# Patient Record
Sex: Female | Born: 1960
Health system: Southern US, Community
[De-identification: ages and names within clinical notes are randomized; demographics above are authoritative.]

## PROBLEM LIST (undated history)

## (undated) DIAGNOSIS — J449 Chronic obstructive pulmonary disease, unspecified: Secondary | ICD-10-CM

## (undated) DIAGNOSIS — R112 Nausea with vomiting, unspecified: Secondary | ICD-10-CM

## (undated) DIAGNOSIS — F329 Major depressive disorder, single episode, unspecified: Secondary | ICD-10-CM

## (undated) DIAGNOSIS — I639 Cerebral infarction, unspecified: Secondary | ICD-10-CM

## (undated) DIAGNOSIS — E876 Hypokalemia: Secondary | ICD-10-CM

## (undated) DIAGNOSIS — I499 Cardiac arrhythmia, unspecified: Secondary | ICD-10-CM

## (undated) DIAGNOSIS — I69322 Dysarthria following cerebral infarction: Secondary | ICD-10-CM

## (undated) DIAGNOSIS — I219 Acute myocardial infarction, unspecified: Secondary | ICD-10-CM

## (undated) DIAGNOSIS — Z87442 Personal history of urinary calculi: Secondary | ICD-10-CM

## (undated) DIAGNOSIS — F32A Depression, unspecified: Secondary | ICD-10-CM

## (undated) DIAGNOSIS — N189 Chronic kidney disease, unspecified: Secondary | ICD-10-CM

## (undated) DIAGNOSIS — E785 Hyperlipidemia, unspecified: Secondary | ICD-10-CM

## (undated) DIAGNOSIS — I10 Essential (primary) hypertension: Secondary | ICD-10-CM

## (undated) DIAGNOSIS — J189 Pneumonia, unspecified organism: Secondary | ICD-10-CM

## (undated) DIAGNOSIS — R001 Bradycardia, unspecified: Secondary | ICD-10-CM

## (undated) DIAGNOSIS — Z9889 Other specified postprocedural states: Secondary | ICD-10-CM

## (undated) HISTORY — DX: Dysarthria following cerebral infarction: I69.322

## (undated) HISTORY — DX: Essential (primary) hypertension: I10

## (undated) HISTORY — DX: Hypokalemia: E87.6

## (undated) HISTORY — DX: Chronic obstructive pulmonary disease, unspecified: J44.9

## (undated) HISTORY — PX: TUMOR REMOVAL: SHX12

## (undated) HISTORY — DX: Cerebral infarction, unspecified: I63.9

## (undated) HISTORY — DX: Depression, unspecified: F32.A

## (undated) HISTORY — DX: Hyperlipidemia, unspecified: E78.5

## (undated) HISTORY — DX: Major depressive disorder, single episode, unspecified: F32.9

---

## 2007-01-26 ENCOUNTER — Encounter: Admission: RE | Admit: 2007-01-26 | Discharge: 2007-01-26 | Payer: Self-pay | Admitting: General Surgery

## 2007-01-27 ENCOUNTER — Ambulatory Visit (HOSPITAL_BASED_OUTPATIENT_CLINIC_OR_DEPARTMENT_OTHER): Admission: RE | Admit: 2007-01-27 | Discharge: 2007-01-27 | Payer: Self-pay | Admitting: General Surgery

## 2007-01-27 ENCOUNTER — Encounter (INDEPENDENT_AMBULATORY_CARE_PROVIDER_SITE_OTHER): Payer: Self-pay | Admitting: General Surgery

## 2007-08-06 ENCOUNTER — Other Ambulatory Visit: Payer: Self-pay | Admitting: Obstetrics & Gynecology

## 2007-08-06 ENCOUNTER — Emergency Department (HOSPITAL_COMMUNITY): Admission: EM | Admit: 2007-08-06 | Discharge: 2007-08-07 | Payer: Self-pay | Admitting: Emergency Medicine

## 2008-01-23 ENCOUNTER — Inpatient Hospital Stay (HOSPITAL_COMMUNITY): Admission: EM | Admit: 2008-01-23 | Discharge: 2008-01-25 | Payer: Self-pay | Admitting: Emergency Medicine

## 2008-02-27 ENCOUNTER — Encounter: Admission: RE | Admit: 2008-02-27 | Discharge: 2008-04-17 | Payer: Self-pay | Admitting: Emergency Medicine

## 2009-01-03 ENCOUNTER — Emergency Department (HOSPITAL_COMMUNITY): Admission: EM | Admit: 2009-01-03 | Discharge: 2009-01-03 | Payer: Self-pay | Admitting: Emergency Medicine

## 2010-12-09 NOTE — Procedures (Signed)
CLINICAL HISTORY:  A 50 year old patient being evaluated for seizure.   MEDICATIONS LISTED:  Hydrochlorothiazide, Lisinopril, Zocor, Lexapro,  Aspirin, Tylenol, Ambien, and Ativan.   The background awake rhythm consists of probably 8 to 9 Hz alpha, which  is of low amplitude, synchronous.  The excessive muscle movement  artifacts were seen throughout the tracing.  High frequency low  amplitude beta activities were also seen throughout the tracing, which  may be a medication effect.  Definite sleep stages are not seen in this  tracing.  No paroxysmal epileptiform activity spikes or sharp waves are  seen.  Length of this recording is 22.7 minutes.  Technical component is  suboptimal with excessive movement artifacts.  Towards the end of the  tracing, the patient has excessive leg movements, which compromise the  study.   IMPRESSION:  This EEG performed during awake state is probably within  normal limits, though suboptimal with excessive movement artifacts.           ______________________________  Kathie Rhodes. Leonie Man, MD     [id number]  D:  01/24/2008 18:13:39  T:  01/25/2008 11:20:43  Job #:  [id number]   cc:   Jill Alexanders, M.D.  Fax: [phone]

## 2010-12-09 NOTE — Op Note (Signed)
NAMEROBENA, MALONEY             ACCOUNT NO.:  [id number]   MEDICAL RECORD NO.:  [id number]          PATIENT TYPE:  AMB   LOCATION:  [place]                          FACILITY:  [facility]   PHYSICIAN:  Marland Kitchen T. Hoxworth, M.D.DATE OF BIRTH:  19-Apr-1961   DATE OF PROCEDURE:  01/27/2007  DATE OF DISCHARGE:                               OPERATIVE REPORT      Marland Kitchen T. Hoxworth, M.D.  Electronically Signed     BTH/MEDQ  D:  01/27/2007  T:  01/28/2007  Job:  [id number]

## 2010-12-09 NOTE — Op Note (Signed)
Kelly Gibson, Kelly Gibson             ACCOUNT NO.:  [id number]   MEDICAL RECORD NO.:  [id number]          PATIENT TYPE:  AMB   LOCATION:  [place]                          FACILITY:  [facility]   PHYSICIAN:  Marland Kitchen T. Hoxworth, M.D.DATE OF BIRTH:  06/06/1961   DATE OF PROCEDURE:  01/27/2007  DATE OF DISCHARGE:                               OPERATIVE REPORT   PRE AND POSTOPERATIVE DIAGNOSIS:  Large soft tissue mass, probable  lipoma right shoulder.   SURGICAL PROCEDURES:  Excision large soft tissue mass right shoulder.   SURGEON:  Darene Lamer. Hoxworth, M.D.   ANESTHESIA:  Laryngeal mask general.   BRIEF HISTORY:  [name] is a 50 year old female who presents with  long history of a gradually enlarging mass over the top of the right  shoulder.  Examination has revealed a approximately 12.6 cm well  circumscribed fleshy probably subcutaneous mass overlying the top of the  right shoulder consistent with a very large lipoma.  Excision under  general anesthesia been recommended and  accepted.  The nature of the  procedure, indications, risks of bleeding and infection were discussed  understood.  She is now brought to the operating room for this  procedure.   DESCRIPTION OF OPERATION:  The patient brought to the operating room and  placed in supine position on the operating table and laryngeal mask  general anesthesia was induced.  She received preoperative antibiotics.  The right shoulder was widely sterilely prepped and draped.  Correct  patient procedure were verified.  I made a anterior-posterior directed  incision along the long access axis of the mass and dissection was  carried down to the subcutaneous tissue.  Encountered was a fairly well  circumscribed fleshy fatty tumor consistent with a very large lipoma  lying in the deep subcutaneous space against the fascia.  For the most  part this was dissected away from surrounding soft tissue bluntly.  A  few adherent areas were taken  with cautery and the mass completely  excised.  Hemostasis obtained with cautery.  The soft tissue was  infiltrated with Marcaine.  The subcu was closed with interrupted 3-0  Vicryl.  The skin was closed with running subcuticular 4-0 Monocryl and  Steri-Strips.  Sponge needle counts correct.  Dry sterile dressings were  applied.  The patient taken recovery in good condition.      Darene Lamer. Hoxworth, M.D.  Electronically Signed     BTH/MEDQ  D:  01/27/2007  T:  01/28/2007  Job:  [id number]

## 2010-12-09 NOTE — H&P (Signed)
NAME:  Kelly Gibson             ACCOUNT NO.:  [id number]   MEDICAL RECORD NO.:  [id number]          PATIENT TYPE:  INP   LOCATION:  4702                         FACILITY:  [facility]   PHYSICIAN:  Jill Alexanders, M.D.  DATE OF BIRTH:  05/08/1961   DATE OF ADMISSION:  01/23/2008  DATE OF DISCHARGE:                              HISTORY & PHYSICAL   HISTORY OF PRESENT ILLNESS:  Kelly Gibson is a 50 year old right-  handed black female, born June 21, 1961, with a history of  hypertension, obesity.  The patient was chasing a parakeet around today  and became short of breath and dizzy.  The patient sat down and then was  suddenly noted to have her eyes rolled back into her head and was  shaking all over for about 1-2 minutes.  The patient was able to respond  after that point, but began having nausea and vomiting and reported  tingling sensations down the left arm and leg and fell weak all over.  The patient was slightly disoriented, but eventually came around, but  still reports some tingling sensations on the left arm and leg and heavy  sensation around the left hip.  The patient denies any visual changes or  headache.  Patient has had some blurred vision for last several days.  CT scan of the brain done through the emergency room was unremarkable.  The patient had been seen by Neurology as a stroke, which was  subsequently cancelled after the history of seizure was undetermined.   PAST MEDICAL HISTORY:  1. Significnat for one new onset seizure event with left-sided      symptoms post event.  2. Obesity.  3. Hypertension.  4. Dyslipidemia.  5. Lipoma resection in the past.  6. Functional examination.  7. History of anxiety.   MEDICATIONS:  Include,  1. Lexapro 20 mg daily.  2. Pravastatin 20 mg daily.  3. Lisinopril 40 mg a day.  4. Hydrochlorothiazide 25 mg daily.  5. Multivitamins daily.   ALLERGIES:  The patient has no known allergies, do not smoke or drink.   SOCIAL  HISTORY:  This patient is married, lives in the [place] area, and has 3 children who are alive and well.  The  patient works as an Optometrist.  Family medical history noted that  mother died with complications of diabetes, father died with an MI.  The  patient's brother died with an MI, another brother died from [place].  The  patient had total of 7 brothers and sisters, 1 sister has diabetes.   REVIEW OF SYSTEMS:  Notable for problems with headache on occasion,  blurring of vision recently, denies abdominal pain, headache, occasional  period of chest pain with short of breath after running around today,  had some urinary incontinence with the above event today.  The patient  denies any dizziness, loss of consciousness, otherwise no prior history  of seizure.   PHYSICAL EXAMINATION:  VITALS:  Blood pressure is 154/67, heart rate is  68.  The patient is afebrile.  GENERAL:  In general, this patient is moderately to  markedly obese black  female who is alert and cooperative at the time of examination.  HEENT:  Examination of head is atraumatic.  EYES: Pupils equal, round, and reactive to light.  Disks are flat  bilaterally.  NECK:  Supple.  No carotid bruits noted.  RESPIRATORY EXAMINATION:  Clear.  Cardiovascular EXAMINATION:  Revealed regular rate and rhythm.  No  obvious murmurs, rubs noted.  ABDOMEN:  Reveals an obese abdomen.  Positive bowel sounds.  No  organomegaly or tenderness noted.  EXTREMITIES:  However without significant edema.  NEUROLOGIC EXAMINATION:  Cranial nerves as above.  Facial symmetry is  present.  Patient has good sensation of the face on the right side,  decreased on the left.  The patient splits the midline with vibratory  sensation of forehead.  The patient has again full extraocular  movements.  Visual fields are full.  Speech is well enunciated, not  aphasic.  No laceration of the tongue is seen.  The patient protrudes  the tongue in the  midline.  Motor testing reveals good strength on all  fours, good symmetric motor is noted throughout.  No drift is seen in  the arms or legs.  The patient has decreased pinprick sensation on left  arm, left leg, superior at the right, vibratory sensation of the arms is  symmetric.  Decreased on the left foot as compared to the right.  The  patient has good finger-nose-finger heel-to-shin.  Gait was not tested.  Deep tendon reflexes are symmetric.  Normal toes are neutral  bilaterally.   LABORATORY VALUES:  Included, white count of 6.4, hemoglobin 13.3,  hematocrit 39.9, MCV of 79.3, platelets of 380, INR 1.0.   Chemistry panel is pending at this time.  CT scan of the head is  unremarkable.   IMPRESSION:  1. New onset seizure type event with focal left-sided symptoms      following the event.  2. Obesity.  3. Hypertension.  4. Functional examination.   This patient has essentially fully recovered from the event witnessed  today.  This patient reports a left hemisensory deficit following the  event, but has functional features on today's examination, no objective  abnormalities.  The event occurred after physical exercise, need to rule  out cardiac rhythm abnormality that may have occurred.   PLAN:  1. Admission to 3000 units monitor bed.  2. Seizure precautions.  3. MRI of the brain.  4. MRI angiogram.  5. EEG study.  No anticonvulsants for now.  We will follow patient's      clinical course while in house.  We will initiate aspirin therapy.      Jill Alexanders, M.D.  Electronically Signed     CKW/MEDQ  D:  01/23/2008  T:  01/24/2008  Job:  [id number]   cc:   [facility]rs[facility] Burnet  [facility]

## 2010-12-09 NOTE — Discharge Summary (Signed)
Kelly Gibson, Kelly Gibson             ACCOUNT NO.:  [id number]   MEDICAL RECORD NO.:  [id number]          PATIENT TYPE:  INP   LOCATION:  4702                         FACILITY:  [facility]   PHYSICIAN:  Jill Alexanders, M.D.  DATE OF BIRTH:  April 04, 1961   DATE OF ADMISSION:  01/23/2008  DATE OF DISCHARGE:                               DISCHARGE SUMMARY   ADMISSION DIAGNOSES:  1. Onset of generalized jerking presumed seizure event with left      hemisensory deficit.  2. Functional examination.  3. Obesity.  4. Hypertension.  5. Dyslipidemia.   DISCHARGE DIAGNOSES:  1. Presumed seizure event with left hemisensory deficit.  2. Functional examination.  3. New onset of diabetes.  4. Obesity.  5. Hypertension.  6. Dyslipidemia.   PROCEDURE:  Procedures done during this admission include.  1. CT scan of the brain.  2. MRI of the brain.  3. MRI angiogram of the intracranial vessels.  4. EEG study.   COMPLICATIONS OF ABOVE PROCEDURES:  None.   HISTORY OF PRESENT ILLNESS:  [name] is a 50 year old right-  handed black female born [birth year], with a history of  hypertension, and obesity.  The patient was chasing a parakeet around  the house on the day of admission and became short of breath and dizzy.  The patient then sat down and was suddenly noted to have her eyes rolled  back into her head and was shaking all over for about 1-2 minutes.  The  patient was able to respond after that point, but began having nausea  and vomiting and then reported tingling sensations down the left arm and  left leg and felt weak all over.  The patient is brought to the  emergency room as a code stroke and CT scan of the brain was done, was  unremarkable.  Code stroke was cancelled.   PAST MEDICAL HISTORY:  Significant.  1. New onset of seizure event with left-sided symptoms following the      event.  2. Obesity.  3. Hypertension.  4. Dyslipidemia.  5. Lipoma resection in the past.  6. Functional examination.  7. History of anxiety.  8. New onset diabetes this admission.   MEDICATIONS:  Include.  1. Lexapro 20 mg daily.  2. Pravastatin 20 mg daily.  3. Lisinopril 40 mg daily.  4. Hydrochlorothiazide 25 mg daily.  5. Multivitamins daily.   ALLERGIES:  The patient has no known allergies.   Does not smoke or drink.  Please refer to history and physical dictation  summary for social history, family history, review of systems, physical  examination.   LABORATORY VALUES:  During this admission included, a white count of  6.4, hemoglobin 13.3, hematocrit 39.9, MCV of 79.3, platelets of 380,  INR 1.0, sodium 132, potassium 3.4, chloride of 97, CO2 21, glucose of  202, BUN of 18, creatinine of 0.93.  The patient has a total bili of  0.9, alk phosphatase of 109, SGOT 29, SGPT of 18, total protein 7.9,  albumin of 3.8, calcium 9.2, CK of 89, MB fraction 1.0, troponin-I 0.01.  The  patient had a hemoglobin A1c of 8.8.  Thyroid profile is pending.   CBGs done during this hospitalization showed a range of 170s to almost  220.   HOSPITAL COURSE:  This patient has done well during the course of  hospitalization.  The patient continued to persist with a left  hemisensory deficit into the second day of hospitalization; however  clinical examination suggested functional features with splitting in  midline with vibratory sensation in the forehead.  The patient underwent  MRI scan of the brain that was unremarkable.  MRI angiogram of the  intracranial vessels by my review is unremarkable.  EEG study has been  done, but the formal report is not available.  The patient has had no  further jerking episodes.  The patient is not been placed on  anticonvulsant medications.  The patient was set up with a dietician to  go over diabetic diet training.  The patient was not placed on  medications for diabetes at this point and we will leave this to the  patient's primary physician to  decide which medications are appropriate  to initiate therapy.  The patient possibly may have had a syncopal  seizure event and will not be treated with anticonvulsants at this  point.  The patient is not to drive for 90 days and will follow-up with  [facility] in 2-3 months following discharge.   DISCHARGE MEDICATIONS:  Include.  1. Lexapro 20 mg daily.  2. Pravastatin 20 mg daily.  3. Lisinopril 40 mg daily.  4. Hydrochlorothiazide 25 mg daily.  5. Multivitamins daily.  6. Aspirin 81 mg daily.   FOLLOWUP:  The patient will need to follow-up with her primary doctor,  Dr. Erskin Burnet with the next week or so to initiate treatment for diabetes.  At the time discharge, the patient is bright, alert, cooperative moves  all 4 extremities now claims to have no sensory deficit.  The patient  will contact our office immediately if another blackout spells/seizure  event occurs.      Jill Alexanders, M.D.  Electronically Signed     CKW/MEDQ  D:  01/25/2008  T:  01/25/2008  Job:  [id number]   cc:   [facility]

## 2011-04-16 LAB — POCT PREGNANCY, URINE
Operator id: 127531
Preg Test, Ur: NEGATIVE

## 2011-04-23 LAB — URINE DRUGS OF ABUSE SCREEN W ALC, ROUTINE (REF LAB)
Benzodiazepines.: NEGATIVE
Cocaine Metabolites: NEGATIVE
Methadone: NEGATIVE
Phencyclidine (PCP): NEGATIVE
Propoxyphene: NEGATIVE

## 2011-04-23 LAB — COMPREHENSIVE METABOLIC PANEL
Albumin: 3.8
BUN: 18
Calcium: 9.2
Chloride: 97
Creatinine, Ser: 0.93
GFR calc non Af Amer: >60
Total Bilirubin: 0.9

## 2011-04-23 LAB — BASIC METABOLIC PANEL
BUN: 21
Calcium: 9.3
Creatinine, Ser: 0.84
GFR calc non Af Amer: >60

## 2011-04-23 LAB — CBC
Hemoglobin: 13.3
MCHC: 33.4
Platelets: 380
RDW: 15.3

## 2011-04-23 LAB — PROTIME-INR: INR: 1

## 2011-04-23 LAB — DIFFERENTIAL
Basophils Absolute: 0
Basophils Relative: 0
Lymphocytes Relative: 35
Monocytes Relative: 8
Neutro Abs: 3.5
Neutrophils Relative %: 54

## 2011-04-23 LAB — TROPONIN I

## 2011-04-23 LAB — CK TOTAL AND CKMB (NOT AT ARMC): CK, MB: 1

## 2011-04-23 LAB — APTT: aPTT: 26

## 2011-05-12 LAB — CBC
HCT: 34.2 — ABNORMAL LOW
MCHC: 33.3
MCV: 81
Platelets: 376
RDW: 14.5 — ABNORMAL HIGH
WBC: 5.1

## 2011-05-12 LAB — URINALYSIS, ROUTINE W REFLEX MICROSCOPIC
Bilirubin Urine: NEGATIVE
Nitrite: NEGATIVE
Specific Gravity, Urine: 1.025
pH: 5.5

## 2011-05-12 LAB — COMPREHENSIVE METABOLIC PANEL
AST: 26
CO2: 24
Calcium: 8.7
Creatinine, Ser: 0.78
GFR calc non Af Amer: >60

## 2011-05-12 LAB — DIFFERENTIAL
Basophils Absolute: 0
Lymphocytes Relative: 31
Monocytes Absolute: 0.4
Neutro Abs: 2.9

## 2011-05-12 LAB — POCT HEMOGLOBIN-HEMACUE
Hemoglobin: 11.9 — ABNORMAL LOW
Operator id: 208731

## 2011-11-24 ENCOUNTER — Ambulatory Visit: Payer: Self-pay | Admitting: Family Medicine

## 2011-11-24 ENCOUNTER — Encounter: Payer: Self-pay | Admitting: Family Medicine

## 2011-12-14 ENCOUNTER — Encounter: Payer: Self-pay | Admitting: Family Medicine

## 2011-12-14 ENCOUNTER — Ambulatory Visit (INDEPENDENT_AMBULATORY_CARE_PROVIDER_SITE_OTHER): Payer: Managed Care, Other (non HMO) | Admitting: Family Medicine

## 2011-12-14 VITALS — BP 138/80 | HR 84 | Resp 18 | Ht 63.0 in | Wt 187.1 lb

## 2011-12-14 DIAGNOSIS — E119 Type 2 diabetes mellitus without complications: Secondary | ICD-10-CM

## 2011-12-14 DIAGNOSIS — F329 Major depressive disorder, single episode, unspecified: Secondary | ICD-10-CM

## 2011-12-14 DIAGNOSIS — I10 Essential (primary) hypertension: Secondary | ICD-10-CM

## 2011-12-14 DIAGNOSIS — E785 Hyperlipidemia, unspecified: Secondary | ICD-10-CM

## 2011-12-14 DIAGNOSIS — E669 Obesity, unspecified: Secondary | ICD-10-CM

## 2011-12-14 DIAGNOSIS — E1165 Type 2 diabetes mellitus with hyperglycemia: Secondary | ICD-10-CM

## 2011-12-14 DIAGNOSIS — F3289 Other specified depressive episodes: Secondary | ICD-10-CM

## 2011-12-14 DIAGNOSIS — E1140 Type 2 diabetes mellitus with diabetic neuropathy, unspecified: Secondary | ICD-10-CM | POA: Insufficient documentation

## 2011-12-14 DIAGNOSIS — F32A Depression, unspecified: Secondary | ICD-10-CM

## 2011-12-14 NOTE — Patient Instructions (Addendum)
Increase your Metformin to 1 tablet in the morning and 2 at night for 1 week then to 2 tablets twice a day Continue the lisinopril Continue glyburide  Pravastatin  Get the labs done fasting - we will call with results Schedule a physical exam for PAP Smear - within the next 6 weeks

## 2011-12-17 ENCOUNTER — Encounter: Payer: Self-pay | Admitting: Family Medicine

## 2011-12-17 DIAGNOSIS — I129 Hypertensive chronic kidney disease with stage 1 through stage 4 chronic kidney disease, or unspecified chronic kidney disease: Secondary | ICD-10-CM | POA: Insufficient documentation

## 2011-12-17 DIAGNOSIS — F329 Major depressive disorder, single episode, unspecified: Secondary | ICD-10-CM | POA: Insufficient documentation

## 2011-12-17 DIAGNOSIS — F32A Depression, unspecified: Secondary | ICD-10-CM | POA: Insufficient documentation

## 2011-12-17 DIAGNOSIS — R45851 Suicidal ideations: Secondary | ICD-10-CM | POA: Insufficient documentation

## 2011-12-17 DIAGNOSIS — I10 Essential (primary) hypertension: Secondary | ICD-10-CM | POA: Insufficient documentation

## 2011-12-17 DIAGNOSIS — E669 Obesity, unspecified: Secondary | ICD-10-CM

## 2011-12-17 DIAGNOSIS — E1159 Type 2 diabetes mellitus with other circulatory complications: Secondary | ICD-10-CM | POA: Insufficient documentation

## 2011-12-17 DIAGNOSIS — E785 Hyperlipidemia, unspecified: Secondary | ICD-10-CM | POA: Insufficient documentation

## 2011-12-17 HISTORY — DX: Obesity, unspecified: E66.9

## 2011-12-17 NOTE — Assessment & Plan Note (Addendum)
LDL 163 in November 2012, continue statin therapy

## 2011-12-17 NOTE — Assessment & Plan Note (Signed)
From todays report she has been stable on medication

## 2011-12-17 NOTE — Assessment & Plan Note (Signed)
>>  ASSESSMENT AND PLAN FOR ESSENTIAL HYPERTENSION, BENIGN WRITTEN ON 12/17/2011  6:07 PM BY [place], KAWANTA F  Mild elevation in blood pressure, no change to meds today

## 2011-12-17 NOTE — Progress Notes (Signed)
  Subjective:    Patient ID: Kelly Gibson, female    DOB: [birth year], 51 y.o.   MRN: [id number]  HPI  Pt here to establish care, previous PCP in [place] in [place], she was also being seen by [facility] urgent care for a few visits Medications and history reviewed DM- diagnosed in 2010 currently on Metformin and glyburide, lantus also prescribed in the past but she is not taking this, fastings are 200, she gets recurrent yeast infections. Last A1C 9.2% in June 2012. HTN- on ACEI  And diuretic Stress/depresison- has been stable on cymbalta for over a year  Due for PAP and Mammogram She has a strong family history of CAD, she had early deaths in siblings secondary to Myocardial infarction  Review of Systems    GEN- denies fatigue, fever, weight loss,weakness, recent illness HEENT- denies eye drainage, change in vision, nasal discharge, CVS- denies chest pain, palpitations RESP- denies SOB, cough, wheeze ABD- denies N/V, change in stools, abd pain GU- denies dysuria, hematuria, dribbling, incontinence MSK- denies joint pain, muscle aches, injury Neuro- denies headache, dizziness, syncope, seizure activity      Objective:   Physical Exam GEN- NAD, alert and oriented x3 HEENT- PERRL, EOMI, non injected sclera, pink conjunctiva, MMM, oropharynx clear Neck- Supple, no thyromegaly CVS- RRR, no murmur RESP-CTAB ABD-NABS,soft, NT,ND EXT- No edema Pulses- Radial, DP- 2+ Psych-normal affect and mood       Assessment & Plan:

## 2011-12-17 NOTE — Assessment & Plan Note (Signed)
Check A1C then titrate medications, continue metformin and glyburide for now

## 2011-12-17 NOTE — Assessment & Plan Note (Signed)
Mild elevation in blood pressure, no change to meds today

## 2011-12-17 NOTE — Assessment & Plan Note (Signed)
>>  ASSESSMENT AND PLAN FOR TYPE 2 DIABETES MELLITUS WITH DIABETIC NEUROPATHY, WITH LONG-TERM CURRENT USE OF INSULIN  ([facility]) WRITTEN ON 12/17/2011  6:06 PM BY [place], KAWANTA F  Check A1C then titrate medications, continue metformin  and glyburide  for now

## 2011-12-18 ENCOUNTER — Other Ambulatory Visit: Payer: Self-pay | Admitting: Family Medicine

## 2011-12-18 MED ORDER — HYDROCHLOROTHIAZIDE 25 MG PO TABS: 25.0000 mg | ORAL_TABLET | Freq: Every day | ORAL | Status: DC

## 2012-01-12 ENCOUNTER — Telehealth: Payer: Self-pay | Admitting: Family Medicine

## 2012-01-12 NOTE — Telephone Encounter (Signed)
Please ask what the Wellbutrin is for, my records indicate she is not a smoker if it for depression she needs to come in for a visit, she was suppose to schedule a physical 6 weeks from last visit

## 2012-01-14 MED ORDER — BUPROPION HCL ER (SR) 150 MG PO TB12: ORAL_TABLET | ORAL | Status: DC

## 2012-01-14 NOTE — Telephone Encounter (Signed)
Wellbutrin sent,please let pt know

## 2012-01-14 NOTE — Telephone Encounter (Signed)
It is for smoking. She had some stressful events take place lately and she started back smoking. She is going on vacation and will schedule her physical as soon as she gets back.

## 2012-01-14 NOTE — Addendum Note (Signed)
Addended by: Vic Blackbird F on: 01/14/2012 12:05 PM   Modules accepted: Orders

## 2012-03-03 LAB — LIPID PANEL
LDL Cholesterol: 129 mg/dL — ABNORMAL HIGH (ref 0–99)
Triglycerides: 244 mg/dL — ABNORMAL HIGH (ref <150)
VLDL: 49 mg/dL — ABNORMAL HIGH (ref 0–40)

## 2012-03-03 LAB — CBC
HCT: 35.9 % — ABNORMAL LOW (ref 36.0–46.0)
Hemoglobin: 12 g/dL (ref 12.0–15.0)
MCHC: 33.4 g/dL (ref 30.0–36.0)
WBC: 5.8 10*3/uL (ref 4.0–10.5)

## 2012-03-03 LAB — HEMOGLOBIN A1C
Hgb A1c MFr Bld: 9.3 % — ABNORMAL HIGH (ref <5.7)
Mean Plasma Glucose: 220 mg/dL — ABNORMAL HIGH (ref <117)

## 2012-03-03 LAB — COMPREHENSIVE METABOLIC PANEL
ALT: 11 U/L (ref 0–35)
AST: 13 U/L (ref 0–37)
Albumin: 3.9 g/dL (ref 3.5–5.2)
Alkaline Phosphatase: 80 U/L (ref 39–117)
Glucose, Bld: 192 mg/dL — ABNORMAL HIGH (ref 70–99)
Potassium: 4.3 mEq/L (ref 3.5–5.3)
Sodium: 136 mEq/L (ref 135–145)
Total Bilirubin: 0.2 mg/dL — ABNORMAL LOW (ref 0.3–1.2)
Total Protein: 7 g/dL (ref 6.0–8.3)

## 2012-03-08 ENCOUNTER — Encounter: Payer: Self-pay | Admitting: Family Medicine

## 2012-03-08 ENCOUNTER — Ambulatory Visit (INDEPENDENT_AMBULATORY_CARE_PROVIDER_SITE_OTHER): Payer: Managed Care, Other (non HMO) | Admitting: Family Medicine

## 2012-03-08 VITALS — BP 132/78 | HR 71 | Resp 16 | Wt 186.0 lb

## 2012-03-08 DIAGNOSIS — F32A Depression, unspecified: Secondary | ICD-10-CM

## 2012-03-08 DIAGNOSIS — E119 Type 2 diabetes mellitus without complications: Secondary | ICD-10-CM

## 2012-03-08 DIAGNOSIS — E785 Hyperlipidemia, unspecified: Secondary | ICD-10-CM

## 2012-03-08 DIAGNOSIS — F419 Anxiety disorder, unspecified: Secondary | ICD-10-CM

## 2012-03-08 DIAGNOSIS — F411 Generalized anxiety disorder: Secondary | ICD-10-CM

## 2012-03-08 DIAGNOSIS — F3289 Other specified depressive episodes: Secondary | ICD-10-CM

## 2012-03-08 DIAGNOSIS — E669 Obesity, unspecified: Secondary | ICD-10-CM

## 2012-03-08 DIAGNOSIS — I10 Essential (primary) hypertension: Secondary | ICD-10-CM

## 2012-03-08 DIAGNOSIS — F329 Major depressive disorder, single episode, unspecified: Secondary | ICD-10-CM

## 2012-03-08 DIAGNOSIS — M6740 Ganglion, unspecified site: Secondary | ICD-10-CM

## 2012-03-08 MED ORDER — VENLAFAXINE HCL ER 37.5 MG PO CP24: 37.5000 mg | ORAL_CAPSULE | Freq: Every day | ORAL | Status: DC

## 2012-03-08 MED ORDER — PRAVASTATIN SODIUM 20 MG PO TABS: 20.0000 mg | ORAL_TABLET | Freq: Every day | ORAL | Status: DC

## 2012-03-08 MED ORDER — METFORMIN HCL 1000 MG PO TABS: 1000.0000 mg | ORAL_TABLET | Freq: Two times a day (BID) | ORAL | Status: DC

## 2012-03-08 NOTE — Patient Instructions (Addendum)
Start Lantus 10 units at bedtime  Metformin changed to 1000mg  twice a day  Start Effexor 37.5mg  once a day  Pravastatin changed to 20mg  once a day  Stop Cymbalta  Continue all other medications  Referral to Surgeon for cyst on wrist  Try to walk 3 times a week Work on the diet and fast food  F/U 4 weeks

## 2012-03-08 NOTE — Progress Notes (Signed)
  Subjective:    Patient ID: Kelly Gibson, female    DOB: 07/31/1960, 51 y.o.   MRN: [id number]  HPI  Patient here to followup labs for hypertension diabetes. She has a lesion on her left wrist that has been there for months which is painful she states occasionally it sticks out. She is also been more anxious and depressed lately. She is on Cymbalta however this is being used for neuropathy in her feet. Her husband is with her today and states that she has mood swings. She has crying spells at times. Her job is very stressful and this is what causes her mood swings. She is sleeping well her appetite is good. She denies suicidal ideations. She needs something to help with her nerves   Review of Systems - per above    GEN- denies fatigue, fever, weight loss,weakness, recent illness HEENT- denies eye drainage, change in vision, nasal discharge, CVS- denies chest pain, palpitations RESP- denies SOB, cough, wheeze ABD- denies N/V, change in stools, abd pain GU- denies dysuria, hematuria, dribbling, incontinence MSK- denies joint pain, muscle aches, injury Neuro- denies headache, dizziness, syncope, seizure activity      Objective:   Physical Exam GEN- NAD, alert and oriented x3 CVS- RRR, no murmur RESP-CTAB EXT- No edema Pulses- Radial, DP- 2+ Left wrist- small cystic lesion beneath skins, moves with tendons as pt makes fist, mild TPP, no erythema, no induration Psych- normal affect and mood, normal thought process no hallucinations, no SI        Assessment & Plan:

## 2012-03-09 DIAGNOSIS — M6740 Ganglion, unspecified site: Secondary | ICD-10-CM | POA: Insufficient documentation

## 2012-03-09 DIAGNOSIS — F419 Anxiety disorder, unspecified: Secondary | ICD-10-CM | POA: Insufficient documentation

## 2012-03-09 HISTORY — DX: Ganglion, unspecified site: M67.40

## 2012-03-09 NOTE — Assessment & Plan Note (Signed)
At goal no change to meds

## 2012-03-09 NOTE — Assessment & Plan Note (Signed)
Refer to general surgery for removal

## 2012-03-09 NOTE — Assessment & Plan Note (Signed)
Depression and anxiety, d/c cymbalta, start effexor

## 2012-03-09 NOTE — Assessment & Plan Note (Signed)
>>  ASSESSMENT AND PLAN FOR ESSENTIAL HYPERTENSION, BENIGN WRITTEN ON 03/09/2012  9:32 PM BY Milinda Antis F  At goal no change to meds

## 2012-03-09 NOTE — Assessment & Plan Note (Signed)
Discussed importance of diet and exercise, short term goals made

## 2012-03-09 NOTE — Assessment & Plan Note (Signed)
>>  ASSESSMENT AND PLAN FOR TYPE 2 DIABETES MELLITUS WITH DIABETIC NEUROPATHY, WITH LONG-TERM CURRENT USE OF INSULIN  ([facility]) WRITTEN ON 03/09/2012  9:31 PM BY [place], KAWANTA F  Uncontrolled, restart lantus  at 10 units at bedtime, maximize Metformin  to 1000mg  BID

## 2012-03-09 NOTE — Assessment & Plan Note (Signed)
Increase pravastatin to 20mg 

## 2012-03-09 NOTE — Assessment & Plan Note (Signed)
Uncontrolled, restart lantus at 10 units at bedtime, maximize Metformin to 1000mg  BID

## 2012-05-03 ENCOUNTER — Ambulatory Visit (INDEPENDENT_AMBULATORY_CARE_PROVIDER_SITE_OTHER): Payer: Managed Care, Other (non HMO) | Admitting: Family Medicine

## 2012-05-03 ENCOUNTER — Encounter: Payer: Self-pay | Admitting: Family Medicine

## 2012-05-03 ENCOUNTER — Telehealth: Payer: Self-pay | Admitting: Family Medicine

## 2012-05-03 VITALS — BP 128/76 | HR 88 | Temp 98.2°F | Resp 18 | Ht 63.0 in | Wt 191.0 lb

## 2012-05-03 DIAGNOSIS — F329 Major depressive disorder, single episode, unspecified: Secondary | ICD-10-CM

## 2012-05-03 DIAGNOSIS — F3289 Other specified depressive episodes: Secondary | ICD-10-CM

## 2012-05-03 DIAGNOSIS — E119 Type 2 diabetes mellitus without complications: Secondary | ICD-10-CM

## 2012-05-03 DIAGNOSIS — Z720 Tobacco use: Secondary | ICD-10-CM

## 2012-05-03 DIAGNOSIS — F32A Depression, unspecified: Secondary | ICD-10-CM

## 2012-05-03 DIAGNOSIS — J40 Bronchitis, not specified as acute or chronic: Secondary | ICD-10-CM

## 2012-05-03 DIAGNOSIS — F17200 Nicotine dependence, unspecified, uncomplicated: Secondary | ICD-10-CM

## 2012-05-03 MED ORDER — VENLAFAXINE HCL ER 75 MG PO CP24: 37.5000 mg | ORAL_CAPSULE | Freq: Every day | ORAL | Status: DC

## 2012-05-03 MED ORDER — AZITHROMYCIN 250 MG PO TABS: ORAL_TABLET | ORAL | Status: AC

## 2012-05-03 MED ORDER — ALBUTEROL SULFATE HFA 108 (90 BASE) MCG/ACT IN AERS: 2.0000 | INHALATION_SPRAY | RESPIRATORY_TRACT | Status: DC | PRN

## 2012-05-03 MED ORDER — GUAIFENESIN-CODEINE 100-10 MG/5ML PO SYRP: 5.0000 mL | ORAL_SOLUTION | Freq: Three times a day (TID) | ORAL | Status: DC | PRN

## 2012-05-03 NOTE — Patient Instructions (Signed)
Treating for bronchitis Take the antibiotics and cough medication Albuterol inhaler for wheezing as needed Effexor increased to 75mg   F/U 6 weeks

## 2012-05-03 NOTE — Telephone Encounter (Signed)
Coming to be seen today per Dr

## 2012-05-03 NOTE — Telephone Encounter (Signed)
Spoke with patient and she will be worked in for this afternoon.

## 2012-05-03 NOTE — Progress Notes (Signed)
  Subjective:    Patient ID: Kelly Gibson, female    DOB: 03/31/1961, 51 y.o.   MRN: [id number]  HPI Patient presents with upper respiratory symptoms for the past week. She's tried Robitussin NyQuil and TheraFlu over-the-counter which has not helped her cough. She feels short of breath with her cough and has noticed some wheezing mostly at nighttime. She's not been smoking recently. She missed her followup appointment for her depression she states that the medication does not work any to be increased.   Review of Systems  GEN- denies fatigue, fever, weight loss,weakness, recent illness HEENT- denies eye drainage, change in vision, nasal discharge, CVS- denies chest pain, palpitations RESP- denies SOB, +cough, +wheeze ABD- denies N/V, change in stools, abd pain Neuro- denies headache, dizziness, syncope, seizure activity      Objective:   Physical Exam GEN- NAD, alert and oriented x3 HEENT- PERRL, EOMI, non injected sclera, pink conjunctiva, MMM, oropharynx clear Neck- Supple, no LAD CVS- RRR, no murmur RESP-clear with occasional wheeze, normal WOB EXT- No edema Pulses- Radial, DP- 2+ Psych-stressed appearing, normal affect, not anxious       Assessment & Plan:

## 2012-05-04 DIAGNOSIS — J40 Bronchitis, not specified as acute or chronic: Secondary | ICD-10-CM | POA: Insufficient documentation

## 2012-05-04 DIAGNOSIS — Z720 Tobacco use: Secondary | ICD-10-CM | POA: Insufficient documentation

## 2012-05-04 NOTE — Assessment & Plan Note (Signed)
Treat infection with antibiotics, albuterol inhaler given, encouraged not to smoke

## 2012-05-04 NOTE — Assessment & Plan Note (Signed)
She did not follow-up as planned from previous visit, increase effexor to 75mg 

## 2012-07-13 ENCOUNTER — Telehealth: Payer: Self-pay | Admitting: Family Medicine

## 2012-07-14 MED ORDER — LISINOPRIL 20 MG PO TABS: 20.0000 mg | ORAL_TABLET | Freq: Every day | ORAL | Status: DC

## 2012-07-14 MED ORDER — HYDROCHLOROTHIAZIDE 25 MG PO TABS: 25.0000 mg | ORAL_TABLET | Freq: Every day | ORAL | Status: DC

## 2012-07-14 MED ORDER — GLYBURIDE 2.5 MG PO TABS: ORAL_TABLET | ORAL | Status: DC

## 2012-07-14 NOTE — Telephone Encounter (Signed)
Med sent pt aware  

## 2012-07-25 ENCOUNTER — Telehealth: Payer: Self-pay | Admitting: Family Medicine

## 2012-07-25 DIAGNOSIS — M6740 Ganglion, unspecified site: Secondary | ICD-10-CM

## 2012-07-25 NOTE — Telephone Encounter (Signed)
Pt needs to make a follow-up appt, there was already a referral to Dr. Lewis Moccasin office already in [place], repeat referral placed

## 2012-07-28 NOTE — Telephone Encounter (Signed)
Pt was scheduled a appt to see dr. Caroline More and also left message for her to call office to make a appt

## 2012-10-27 ENCOUNTER — Other Ambulatory Visit: Payer: Self-pay | Admitting: Family Medicine

## 2013-01-07 ENCOUNTER — Other Ambulatory Visit: Payer: Self-pay | Admitting: Family Medicine

## 2013-01-09 NOTE — Telephone Encounter (Signed)
Med refilled.

## 2013-01-10 ENCOUNTER — Ambulatory Visit (INDEPENDENT_AMBULATORY_CARE_PROVIDER_SITE_OTHER): Payer: Managed Care, Other (non HMO) | Admitting: Family Medicine

## 2013-01-10 ENCOUNTER — Encounter: Payer: Self-pay | Admitting: Family Medicine

## 2013-01-10 VITALS — BP 114/66 | HR 80 | Temp 97.5°F | Resp 18 | Ht 61.75 in | Wt 184.0 lb

## 2013-01-10 DIAGNOSIS — F17200 Nicotine dependence, unspecified, uncomplicated: Secondary | ICD-10-CM

## 2013-01-10 DIAGNOSIS — E119 Type 2 diabetes mellitus without complications: Secondary | ICD-10-CM

## 2013-01-10 DIAGNOSIS — L609 Nail disorder, unspecified: Secondary | ICD-10-CM

## 2013-01-10 DIAGNOSIS — F3289 Other specified depressive episodes: Secondary | ICD-10-CM

## 2013-01-10 DIAGNOSIS — Z720 Tobacco use: Secondary | ICD-10-CM

## 2013-01-10 DIAGNOSIS — J40 Bronchitis, not specified as acute or chronic: Secondary | ICD-10-CM

## 2013-01-10 DIAGNOSIS — F329 Major depressive disorder, single episode, unspecified: Secondary | ICD-10-CM

## 2013-01-10 DIAGNOSIS — F32A Depression, unspecified: Secondary | ICD-10-CM

## 2013-01-10 DIAGNOSIS — I10 Essential (primary) hypertension: Secondary | ICD-10-CM

## 2013-01-10 DIAGNOSIS — E785 Hyperlipidemia, unspecified: Secondary | ICD-10-CM

## 2013-01-10 MED ORDER — METFORMIN HCL 1000 MG PO TABS: 1000.0000 mg | ORAL_TABLET | Freq: Two times a day (BID) | ORAL | Status: DC

## 2013-01-10 MED ORDER — VENLAFAXINE HCL ER 75 MG PO CP24: 75.0000 mg | ORAL_CAPSULE | Freq: Every day | ORAL | Status: DC

## 2013-01-10 MED ORDER — HYDROCHLOROTHIAZIDE 25 MG PO TABS: 25.0000 mg | ORAL_TABLET | Freq: Every day | ORAL | Status: DC

## 2013-01-10 MED ORDER — LISINOPRIL 20 MG PO TABS: 20.0000 mg | ORAL_TABLET | Freq: Every day | ORAL | Status: DC

## 2013-01-10 MED ORDER — GLYBURIDE 2.5 MG PO TABS: ORAL_TABLET | ORAL | Status: DC

## 2013-01-10 MED ORDER — ALBUTEROL SULFATE HFA 108 (90 BASE) MCG/ACT IN AERS: 2.0000 | INHALATION_SPRAY | RESPIRATORY_TRACT | Status: DC | PRN

## 2013-01-10 MED ORDER — PRAVASTATIN SODIUM 20 MG PO TABS: 20.0000 mg | ORAL_TABLET | Freq: Every day | ORAL | Status: DC

## 2013-01-10 MED ORDER — AZITHROMYCIN 250 MG PO TABS: ORAL_TABLET | ORAL | Status: DC

## 2013-01-10 MED ORDER — PREDNISONE 10 MG PO TABS: ORAL_TABLET | ORAL | Status: DC

## 2013-01-10 MED ORDER — INSULIN GLARGINE 100 UNIT/ML ~~LOC~~ SOLN: 15.0000 [IU] | Freq: Every day | SUBCUTANEOUS | Status: DC

## 2013-01-10 NOTE — Progress Notes (Signed)
  Subjective:    Patient ID: Kelly Gibson, female    DOB: 07-06-1961, 52 y.o.   MRN: [id number]  HPI Patient here to follow chronic medical problems. She's not been seen since October 2013. She's not been seen for diabetes mellitus hypertension since August. She still taking Lantus however she gets between 10-20 units depending on how she feels that she does not check her blood sugars. She states she is taking oral medications and her blood pressure medications. She complains of cough for the past month with production she's also had some wheezing and shortness of breath. She restarted smoking and is now smoking 7-8 cigarettes a day. 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 She will unfortunately lose her insurance at the end of the month   Review of Systems  GEN- denies fatigue, fever, weight loss,weakness, recent illness HEENT- denies eye drainage, change in vision, nasal discharge, CVS- denies chest pain, palpitations RESP- denies SOB,+ cough, +wheeze ABD- denies N/V, change in stools, abd pain GU- denies dysuria, hematuria, dribbling, incontinence MSK- denies joint pain, muscle aches, injury Neuro- denies headache, dizziness, syncope, seizure activity      Objective:   Physical Exam  GEN- NAD, alert and oriented x3 HEENT- PERRL, EOMI, non injected sclera, pink conjunctiva, MMM, oropharynx clear Neck- Supple, no LAD CVS- RRR, no murmur RESP-Bilateral wheeze, + rhonchi scattered bilat, normal WOB, harsh cough ABD-NABS,soft,NTND EXT- No edema Pulses- Radial, DP- 2+ Psych-Not depressed or anxious appearing      Assessment & Plan:

## 2013-01-10 NOTE — Assessment & Plan Note (Signed)
Doing well on effexor.   

## 2013-01-10 NOTE — Assessment & Plan Note (Signed)
Antibiotics, low dose prednisone, obtain CXR, concern she has recurrent bronchitis,may be sign of early COPD Would obtain PFT in future

## 2013-01-10 NOTE — Assessment & Plan Note (Signed)
Well controlled, no change to meds 

## 2013-01-10 NOTE — Assessment & Plan Note (Signed)
Continues to smoke, smoking cessation given

## 2013-01-10 NOTE — Assessment & Plan Note (Signed)
>>  ASSESSMENT AND PLAN FOR TYPE 2 DIABETES MELLITUS WITH DIABETIC NEUROPATHY, WITH LONG-TERM CURRENT USE OF INSULIN  (HCC) WRITTEN ON 01/10/2013  9:19 PM BY BARI REA F  Uncontrolled,Fasting labs needed, pt to have done on Monday,for now continue oral medications Lantus  15 units, discussed importance of f/u on regular basis Urine Micro needed Discussed testing CBG, will have her test fasting, to see if she can be compliant with this Podiatry referral

## 2013-01-10 NOTE — Patient Instructions (Signed)
Continue lantus 15 units at bedtime Start the antibiotics and prednisone/ use the inhaler every 4 hours as needed Quit smoking F/U Next Wed f/u labs Come Monday to get labs

## 2013-01-10 NOTE — Assessment & Plan Note (Signed)
>>  ASSESSMENT AND PLAN FOR ESSENTIAL HYPERTENSION, BENIGN WRITTEN ON 01/10/2013  9:14 PM BY Milinda Antis F  Well controlled, no change to meds

## 2013-01-10 NOTE — Assessment & Plan Note (Signed)
On statin drug , FLP,CMET

## 2013-01-10 NOTE — Assessment & Plan Note (Addendum)
Uncontrolled,Fasting labs needed, pt to have done on Monday,for now continue oral medications Lantus 15 units, discussed importance of f/u on regular basis Urine Micro needed Discussed testing CBG, will have her test fasting, to see if she can be compliant with this Podiatry referral

## 2013-01-11 ENCOUNTER — Ambulatory Visit
Admission: RE | Admit: 2013-01-11 | Discharge: 2013-01-11 | Disposition: A | Discharge status: Home / Self Care | Payer: Managed Care, Other (non HMO) | Source: Ambulatory Visit | Attending: Family Medicine | Admitting: Family Medicine

## 2013-01-11 DIAGNOSIS — J40 Bronchitis, not specified as acute or chronic: Secondary | ICD-10-CM

## 2013-01-12 ENCOUNTER — Other Ambulatory Visit (INDEPENDENT_AMBULATORY_CARE_PROVIDER_SITE_OTHER): Payer: Managed Care, Other (non HMO)

## 2013-01-12 DIAGNOSIS — E785 Hyperlipidemia, unspecified: Secondary | ICD-10-CM

## 2013-01-12 DIAGNOSIS — E119 Type 2 diabetes mellitus without complications: Secondary | ICD-10-CM

## 2013-01-12 LAB — CBC WITH DIFFERENTIAL/PLATELET
Basophils Absolute: 0 10*3/uL (ref 0.0–0.1)
Basophils Relative: 1 % (ref 0–1)
Eosinophils Absolute: 0.1 10*3/uL (ref 0.0–0.7)
Eosinophils Relative: 1 % (ref 0–5)
HCT: 37.8 % (ref 36.0–46.0)
Hemoglobin: 13 g/dL (ref 12.0–15.0)
Lymphocytes Relative: 30 % (ref 12–46)
Lymphs Abs: 2.4 10*3/uL (ref 0.7–4.0)
MCH: 26.4 pg (ref 26.0–34.0)
MCHC: 34.4 g/dL (ref 30.0–36.0)
MCV: 76.8 fL — ABNORMAL LOW (ref 78.0–100.0)
Monocytes Absolute: 0.5 10*3/uL (ref 0.1–1.0)
Monocytes Relative: 7 % (ref 3–12)
Neutro Abs: 5 10*3/uL (ref 1.7–7.7)
Neutrophils Relative %: 61 % (ref 43–77)
Platelets: 406 10*3/uL — ABNORMAL HIGH (ref 150–400)
RBC: 4.92 MIL/uL (ref 3.87–5.11)
RDW: 14.3 % (ref 11.5–15.5)
WBC: 8 10*3/uL (ref 4.0–10.5)

## 2013-01-12 LAB — COMPREHENSIVE METABOLIC PANEL
Albumin: 4 g/dL (ref 3.5–5.2)
BUN: 16 mg/dL (ref 6–23)
CO2: 29 mEq/L (ref 19–32)
Calcium: 10.2 mg/dL (ref 8.4–10.5)
Chloride: 98 mEq/L (ref 96–112)
Creat: 0.77 mg/dL (ref 0.50–1.10)
Potassium: 4.5 mEq/L (ref 3.5–5.3)

## 2013-01-12 LAB — HEMOGLOBIN A1C
Hgb A1c MFr Bld: 11.3 % — ABNORMAL HIGH (ref <5.7)
Mean Plasma Glucose: 278 mg/dL — ABNORMAL HIGH (ref <117)

## 2013-01-12 LAB — LIPID PANEL: HDL: 46 mg/dL (ref >39)

## 2013-01-18 ENCOUNTER — Encounter: Payer: Self-pay | Admitting: Family Medicine

## 2013-01-18 ENCOUNTER — Ambulatory Visit (INDEPENDENT_AMBULATORY_CARE_PROVIDER_SITE_OTHER): Payer: Managed Care, Other (non HMO) | Admitting: Family Medicine

## 2013-01-18 VITALS — BP 120/70 | HR 82 | Temp 98.2°F | Resp 20 | Ht 63.0 in | Wt 183.0 lb

## 2013-01-18 DIAGNOSIS — F411 Generalized anxiety disorder: Secondary | ICD-10-CM

## 2013-01-18 DIAGNOSIS — F329 Major depressive disorder, single episode, unspecified: Secondary | ICD-10-CM

## 2013-01-18 DIAGNOSIS — F3289 Other specified depressive episodes: Secondary | ICD-10-CM

## 2013-01-18 DIAGNOSIS — IMO0002 Reserved for concepts with insufficient information to code with codable children: Secondary | ICD-10-CM

## 2013-01-18 DIAGNOSIS — F419 Anxiety disorder, unspecified: Secondary | ICD-10-CM

## 2013-01-18 DIAGNOSIS — E1165 Type 2 diabetes mellitus with hyperglycemia: Secondary | ICD-10-CM

## 2013-01-18 DIAGNOSIS — J40 Bronchitis, not specified as acute or chronic: Secondary | ICD-10-CM

## 2013-01-18 DIAGNOSIS — F32A Depression, unspecified: Secondary | ICD-10-CM

## 2013-01-18 DIAGNOSIS — E785 Hyperlipidemia, unspecified: Secondary | ICD-10-CM

## 2013-01-18 DIAGNOSIS — IMO0001 Reserved for inherently not codable concepts without codable children: Secondary | ICD-10-CM

## 2013-01-18 MED ORDER — PRAVASTATIN SODIUM 40 MG PO TABS: 40.0000 mg | ORAL_TABLET | Freq: Every day | ORAL | Status: DC

## 2013-01-18 MED ORDER — VENLAFAXINE HCL ER 150 MG PO CP24: 150.0000 mg | ORAL_CAPSULE | Freq: Every day | ORAL | Status: DC

## 2013-01-18 MED ORDER — FLUCONAZOLE 150 MG PO TABS: 150.0000 mg | ORAL_TABLET | Freq: Once | ORAL | Status: DC

## 2013-01-18 MED ORDER — INSULIN GLARGINE 100 UNIT/ML ~~LOC~~ SOLN: 20.0000 [IU] | Freq: Every day | SUBCUTANEOUS | Status: DC

## 2013-01-18 MED ORDER — GLUCOSE BLOOD VI STRP: ORAL_STRIP | Status: DC

## 2013-01-18 NOTE — Assessment & Plan Note (Addendum)
Now resolved, cxr negative, discussed importance of tobacco cessation Pt requested diflucan since being on antibiotics, has yeast infection

## 2013-01-18 NOTE — Patient Instructions (Addendum)
Increase pravastatin to 40mg  Increase effexor to 150mg   Increase lantus to 20 units Take blood sugar fasting and before meals  I will send script for test strips, new cholesterol dose, new effexor dose, lantus  Diflucan sent F/U via phone in 1 week Call health department and schedule appt if not able to go on husbands insurance

## 2013-01-18 NOTE — Assessment & Plan Note (Signed)
Uncontrolled ,increase pravastatin to 40mg , goal LDL < 100

## 2013-01-18 NOTE — Assessment & Plan Note (Signed)
Increase effexor to 150mg 

## 2013-01-18 NOTE — Progress Notes (Signed)
  Subjective:    Patient ID: Kelly Gibson, female    DOB: 04/08/1961, 52 y.o.   MRN: [id number]  HPI  Pt here for intermin visit f/u bronchitis and labs. She also request her effexor be increased, with her stress from being laid off and now loosing her insurance, medication is helping but not enough. Still gets anxious and stressed. Labs reviewed- A1C uncontrolled at 11.3, taking lantus 15 units CXR- neg for pneumonia, s/p prednisone and antibiotics cough much improved, no wheezing Review of Systems   GEN- denies fatigue, fever, weight loss,weakness, recent illness CVS- denies chest pain, palpitations RESP- denies SOB, +cough, wheeze       Objective:   Physical Exam GEN- NAD, alert and oriented x3 CVS- RRR, no murmur RESP-CTAB, no wheeze Psych- normal affect and mood, no apparent SI, good eye contact       Assessment & Plan:

## 2013-01-18 NOTE — Assessment & Plan Note (Signed)
deteriorated with loss of job, increase effexor

## 2013-01-18 NOTE — Assessment & Plan Note (Signed)
>>  ASSESSMENT AND PLAN FOR TYPE 2 DIABETES MELLITUS WITH DIABETIC NEUROPATHY, WITH LONG-TERM CURRENT USE OF INSULIN  (HCC) WRITTEN ON 01/18/2013  9:45 PM BY BARI REA F  DM uncontrolled, she will need to follow up with health dept, since insurance runs out on 6/25. Increase lantus  to 20 units,  We will f/u by phone until she establishes and continue to titrate her insulin  Test CBG QID ,fasting and QAC

## 2013-01-18 NOTE — Assessment & Plan Note (Signed)
DM uncontrolled, she will need to follow up with health dept, since insurance runs out on 6/25. Increase lantus to 20 units,  We will f/u by phone until she establishes and continue to titrate her insulin Test CBG QID ,fasting and QAC

## 2013-01-25 ENCOUNTER — Telehealth: Payer: Self-pay | Admitting: Family Medicine

## 2013-01-25 NOTE — Telephone Encounter (Signed)
I called pt again, LVM unable to reach, Needs insulin titrated on lantus 20 units

## 2013-01-25 NOTE — Telephone Encounter (Signed)
I called patient to check on her blood sugars. LVM. She's currently on Lantus 20 units this will need to be titrated. Please get her blood sugars from her and send me a message

## 2013-01-26 NOTE — Telephone Encounter (Signed)
Called pt again today and left message to return my call

## 2013-01-30 NOTE — Telephone Encounter (Signed)
LMTRC

## 2013-01-31 NOTE — Telephone Encounter (Signed)
LMTRC

## 2013-02-01 ENCOUNTER — Telehealth: Payer: Self-pay | Admitting: Family Medicine

## 2013-02-01 NOTE — Telephone Encounter (Signed)
Pt bs readings

## 2013-02-01 NOTE — Telephone Encounter (Signed)
DATE  AM (fasting) 01/26/13  253 01/27/13  317 01/28/13  237 01/29/13  283  01/30/13  327 01/31/13  255 02/01/13  211  She is taking 20 of Lantus at night

## 2013-02-01 NOTE — Telephone Encounter (Signed)
Pt called back today and I had Dr. Dennard Schaumann to look at them and he increased her lantus to 30 units

## 2013-02-01 NOTE — Telephone Encounter (Signed)
Increase Lantus to 30 units qday and recheck sugars in 1 week.

## 2013-02-01 NOTE — Telephone Encounter (Signed)
Pt aware to increase Lantus

## 2013-02-07 ENCOUNTER — Other Ambulatory Visit: Payer: Self-pay | Admitting: Family Medicine

## 2013-05-09 ENCOUNTER — Other Ambulatory Visit: Payer: Self-pay | Admitting: Family Medicine

## 2013-05-30 ENCOUNTER — Telehealth: Payer: Self-pay | Admitting: *Deleted

## 2013-05-30 ENCOUNTER — Encounter: Payer: Self-pay | Admitting: Family Medicine

## 2013-05-30 MED ORDER — HYDROCHLOROTHIAZIDE 25 MG PO TABS: 25.0000 mg | ORAL_TABLET | Freq: Every day | ORAL | Status: DC

## 2013-05-30 MED ORDER — VENLAFAXINE HCL ER 150 MG PO CP24: 150.0000 mg | ORAL_CAPSULE | Freq: Every day | ORAL | Status: DC

## 2013-05-30 MED ORDER — INSULIN GLARGINE 100 UNIT/ML ~~LOC~~ SOLN: 20.0000 [IU] | Freq: Every day | SUBCUTANEOUS | Status: DC

## 2013-05-30 NOTE — Telephone Encounter (Signed)
Medication refill for one time only.  Patient needs to be seen.  Letter sent for patient to call and schedule 

## 2013-05-31 ENCOUNTER — Other Ambulatory Visit: Payer: Self-pay | Admitting: Family Medicine

## 2013-05-31 MED ORDER — INSULIN GLARGINE 100 UNIT/ML ~~LOC~~ SOLN: 20.0000 [IU] | Freq: Every day | SUBCUTANEOUS | Status: DC

## 2013-06-13 ENCOUNTER — Telehealth: Payer: Self-pay | Admitting: Family Medicine

## 2013-06-13 MED ORDER — DULOXETINE HCL 30 MG PO CPEP: 30.0000 mg | ORAL_CAPSULE | Freq: Two times a day (BID) | ORAL | Status: DC

## 2013-06-13 NOTE — Telephone Encounter (Signed)
Pt stopped taking effexor d/t making her feel lightheaded and confused and so went back to cymbalta, wants refill on cymbalta 60mg  is it okay to refill? Pt states you took her off of cymbalta and put her on Effexor.

## 2013-06-13 NOTE — Telephone Encounter (Signed)
Patient needs a refill on her Cymbalta .

## 2013-06-13 NOTE — Telephone Encounter (Signed)
She can restart cymbalta, she needs to make an appt. I will give her 2 month supply only  She can start cymbalta at 30mg  daily for 2 weeks, then increase to 60mg 

## 2013-06-14 NOTE — Telephone Encounter (Signed)
Pt aware.

## 2013-06-16 ENCOUNTER — Ambulatory Visit (INDEPENDENT_AMBULATORY_CARE_PROVIDER_SITE_OTHER): Payer: BC Managed Care – PPO | Admitting: Family Medicine

## 2013-06-16 VITALS — BP 138/80 | HR 72 | Temp 97.8°F | Resp 18 | Ht 63.0 in | Wt 183.0 lb

## 2013-06-16 DIAGNOSIS — I10 Essential (primary) hypertension: Secondary | ICD-10-CM

## 2013-06-16 DIAGNOSIS — F17200 Nicotine dependence, unspecified, uncomplicated: Secondary | ICD-10-CM

## 2013-06-16 DIAGNOSIS — F329 Major depressive disorder, single episode, unspecified: Secondary | ICD-10-CM

## 2013-06-16 DIAGNOSIS — Z9114 Patient's other noncompliance with medication regimen: Secondary | ICD-10-CM

## 2013-06-16 DIAGNOSIS — Z23 Encounter for immunization: Secondary | ICD-10-CM

## 2013-06-16 DIAGNOSIS — E785 Hyperlipidemia, unspecified: Secondary | ICD-10-CM

## 2013-06-16 DIAGNOSIS — Z91199 Patient's noncompliance with other medical treatment and regimen due to unspecified reason: Secondary | ICD-10-CM

## 2013-06-16 DIAGNOSIS — J40 Bronchitis, not specified as acute or chronic: Secondary | ICD-10-CM

## 2013-06-16 DIAGNOSIS — N950 Postmenopausal bleeding: Secondary | ICD-10-CM

## 2013-06-16 DIAGNOSIS — F3289 Other specified depressive episodes: Secondary | ICD-10-CM

## 2013-06-16 DIAGNOSIS — Z9119 Patient's noncompliance with other medical treatment and regimen: Secondary | ICD-10-CM

## 2013-06-16 DIAGNOSIS — Z91148 Patient's other noncompliance with medication regimen for other reason: Secondary | ICD-10-CM

## 2013-06-16 DIAGNOSIS — F32A Depression, unspecified: Secondary | ICD-10-CM

## 2013-06-16 DIAGNOSIS — E1165 Type 2 diabetes mellitus with hyperglycemia: Secondary | ICD-10-CM

## 2013-06-16 DIAGNOSIS — IMO0001 Reserved for inherently not codable concepts without codable children: Secondary | ICD-10-CM

## 2013-06-16 DIAGNOSIS — Z1231 Encounter for screening mammogram for malignant neoplasm of breast: Secondary | ICD-10-CM

## 2013-06-16 DIAGNOSIS — Z720 Tobacco use: Secondary | ICD-10-CM

## 2013-06-16 DIAGNOSIS — IMO0002 Reserved for concepts with insufficient information to code with codable children: Secondary | ICD-10-CM

## 2013-06-16 LAB — CBC WITH DIFFERENTIAL/PLATELET
Basophils Absolute: 0 10*3/uL (ref 0.0–0.1)
Basophils Relative: 1 % (ref 0–1)
HCT: 36.4 % (ref 36.0–46.0)
Lymphocytes Relative: 38 % (ref 12–46)
Lymphs Abs: 1.7 10*3/uL (ref 0.7–4.0)
MCHC: 33.8 g/dL (ref 30.0–36.0)
Monocytes Absolute: 0.3 10*3/uL (ref 0.1–1.0)
Neutro Abs: 2.2 10*3/uL (ref 1.7–7.7)
Neutrophils Relative %: 51 % (ref 43–77)
Platelets: 433 10*3/uL — ABNORMAL HIGH (ref 150–400)
RBC: 4.67 MIL/uL (ref 3.87–5.11)
RDW: 13.8 % (ref 11.5–15.5)
WBC: 4.4 10*3/uL (ref 4.0–10.5)

## 2013-06-16 LAB — COMPREHENSIVE METABOLIC PANEL
ALT: 10 U/L (ref 0–35)
Albumin: 4.1 g/dL (ref 3.5–5.2)
CO2: 27 mEq/L (ref 19–32)
Calcium: 9.7 mg/dL (ref 8.4–10.5)
Chloride: 96 mEq/L (ref 96–112)
Creat: 0.65 mg/dL (ref 0.50–1.10)
Potassium: 4.7 mEq/L (ref 3.5–5.3)
Sodium: 136 mEq/L (ref 135–145)
Total Bilirubin: 0.3 mg/dL (ref 0.3–1.2)
Total Protein: 7.2 g/dL (ref 6.0–8.3)

## 2013-06-16 LAB — LIPID PANEL
LDL Cholesterol: 173 mg/dL — ABNORMAL HIGH (ref 0–99)
Total CHOL/HDL Ratio: 6.5 Ratio
Triglycerides: 344 mg/dL — ABNORMAL HIGH (ref <150)
VLDL: 69 mg/dL — ABNORMAL HIGH (ref 0–40)

## 2013-06-16 LAB — FSH/LH: FSH: 32 m[IU]/mL

## 2013-06-16 LAB — HEMOGLOBIN A1C: Mean Plasma Glucose: 275 mg/dL — ABNORMAL HIGH (ref <117)

## 2013-06-16 MED ORDER — METFORMIN HCL 1000 MG PO TABS: ORAL_TABLET | ORAL | Status: DC

## 2013-06-16 MED ORDER — GLYBURIDE 2.5 MG PO TABS: ORAL_TABLET | ORAL | Status: DC

## 2013-06-16 MED ORDER — ALBUTEROL SULFATE HFA 108 (90 BASE) MCG/ACT IN AERS: 2.0000 | INHALATION_SPRAY | RESPIRATORY_TRACT | Status: DC | PRN

## 2013-06-16 MED ORDER — PRAVASTATIN SODIUM 40 MG PO TABS: 40.0000 mg | ORAL_TABLET | Freq: Every day | ORAL | Status: DC

## 2013-06-16 MED ORDER — GUAIFENESIN-CODEINE 100-10 MG/5ML PO SOLN: 10.0000 mL | Freq: Four times a day (QID) | ORAL | Status: DC | PRN

## 2013-06-16 MED ORDER — INSULIN GLARGINE 100 UNIT/ML ~~LOC~~ SOLN: 20.0000 [IU] | Freq: Every day | SUBCUTANEOUS | Status: DC

## 2013-06-16 MED ORDER — FLUCONAZOLE 150 MG PO TABS: 150.0000 mg | ORAL_TABLET | Freq: Once | ORAL | Status: DC

## 2013-06-16 MED ORDER — DULOXETINE HCL 60 MG PO CPEP: 60.0000 mg | ORAL_CAPSULE | Freq: Every day | ORAL | Status: DC

## 2013-06-16 MED ORDER — AZITHROMYCIN 250 MG PO TABS: ORAL_TABLET | ORAL | Status: DC

## 2013-06-16 MED ORDER — HYDROCHLOROTHIAZIDE 25 MG PO TABS: 25.0000 mg | ORAL_TABLET | Freq: Every day | ORAL | Status: DC

## 2013-06-16 NOTE — Patient Instructions (Signed)
Take lantus 30 units daily Continue all other medications Bronchitis- take antibiotics, cough medication and inhaler FLu shot given Mammogram referral Referral to GYN F/U 4 weeks - With readings

## 2013-06-17 ENCOUNTER — Encounter: Payer: Self-pay | Admitting: Family Medicine

## 2013-06-17 DIAGNOSIS — Z91148 Patient's other noncompliance with medication regimen for other reason: Secondary | ICD-10-CM | POA: Insufficient documentation

## 2013-06-17 DIAGNOSIS — Z9114 Patient's other noncompliance with medication regimen: Secondary | ICD-10-CM | POA: Insufficient documentation

## 2013-06-17 NOTE — Assessment & Plan Note (Signed)
We'll check cholesterol panel she's not taking her medications on arrival basis

## 2013-06-17 NOTE — Assessment & Plan Note (Signed)
Blood pressure actually looks okay today. I will continue her on the same regimen

## 2013-06-17 NOTE — Assessment & Plan Note (Signed)
>>  ASSESSMENT AND PLAN FOR ESSENTIAL HYPERTENSION, BENIGN WRITTEN ON 06/17/2013  8:00 PM BY [place], KAWANTA F  Blood pressure actually looks okay today. I will continue her on the same regimen

## 2013-06-17 NOTE — Assessment & Plan Note (Addendum)
A1c shows chronic uncontrolled state. She's not compliant with her medications. Discussed importance of taking her insulin every day as well as checking her blood sugars are regular basis. Lantus 30 units

## 2013-06-17 NOTE — Progress Notes (Signed)
  Subjective:    Patient ID: Kelly Gibson, female    DOB: 09-Jul-1961, 52 y.o.   MRN: [id number]  HPI Patient here to followup medications. She recently received a new job and now wants to restart her medications. She's not been taking her medications on a regular basis. She does not inject her insulin every day states typically 3 times a week. She's not been checking her blood sugars. She complains of cough for the past 2 months with production. She's also had some wheezing. She last had fever about one week ago. She denies any shortness of breath. She continues to smoke 10 cigarettes a day.   Review of Systems  GEN- denies fatigue, fever, weight loss,weakness, recent illness HEENT- denies eye drainage, change in vision, nasal discharge, CVS- denies chest pain, palpitations RESP- denies SOB, cough, wheeze ABD- denies N/V, change in stools, abd pain GU- denies dysuria, hematuria, dribbling, incontinence MSK- denies joint pain, muscle aches, injury Neuro- denies headache, dizziness, syncope, seizure activity      Objective:   Physical Exam GEN- NAD, alert and oriented x3 HEENT- PERRL, EOMI, non injected sclera, pink conjunctiva, MMM, oropharynx clear Neck- Supple, no LAD CVS- RRR, no murmur RESP-course BS and congestion, few scattered wheeze ABD-NABS,soft,NT,ND EXT- No edema Pulses- Radial, DP- 2+ Psych- normal affect and mood       Assessment & Plan:

## 2013-06-17 NOTE — Assessment & Plan Note (Signed)
Will treat with albuterol and Z-Pak. Due to her smoking she needs to have pulmonary function test done when she is a healthier state.

## 2013-06-17 NOTE — Assessment & Plan Note (Signed)
>>  ASSESSMENT AND PLAN FOR TYPE 2 DIABETES MELLITUS WITH DIABETIC NEUROPATHY, WITH LONG-TERM CURRENT USE OF INSULIN  (HCC) WRITTEN ON 06/17/2013  8:01 PM BY [place], KAWANTA F  A1c shows chronic uncontrolled state. She's not compliant with her medications. Discussed importance of taking her insulin  every day as well as checking her blood sugars are regular basis. Lantus  30 units

## 2013-06-17 NOTE — Progress Notes (Signed)
Patient ID: Kelly Gibson, female   DOB: Dec 19, 1960, 52 y.o.   MRN: [id number] Patient also stated that she has had some bleeding since her menopause which is over a year ago. She's not had any exams done. I will check her hormone labs she will be referred to GYN for workup of postmenopausal bleeding

## 2013-06-17 NOTE — Assessment & Plan Note (Signed)
Unchanged she is not ready to quit

## 2013-06-17 NOTE — Addendum Note (Signed)
Addended by: Vic Blackbird F on: 06/17/2013 08:57 PM   Modules accepted: Orders

## 2013-06-17 NOTE — Assessment & Plan Note (Signed)
Resume Cymbalta 60mg 

## 2013-06-19 MED ORDER — ATORVASTATIN CALCIUM 20 MG PO TABS: 20.0000 mg | ORAL_TABLET | Freq: Every day | ORAL | Status: DC

## 2013-06-19 NOTE — Addendum Note (Signed)
Addended by: Vic Blackbird F on: 06/19/2013 10:12 PM   Modules accepted: Orders, Medications

## 2013-06-29 ENCOUNTER — Encounter: Payer: Self-pay | Admitting: *Deleted

## 2013-07-04 ENCOUNTER — Encounter: Payer: Self-pay | Admitting: *Deleted

## 2013-07-17 ENCOUNTER — Ambulatory Visit: Payer: BC Managed Care – PPO | Admitting: Family Medicine

## 2013-07-24 ENCOUNTER — Encounter: Payer: Self-pay | Admitting: *Deleted

## 2013-07-25 ENCOUNTER — Telehealth: Payer: Self-pay | Admitting: Family Medicine

## 2013-07-25 MED ORDER — ATORVASTATIN CALCIUM 20 MG PO TABS: 20.0000 mg | ORAL_TABLET | Freq: Every day | ORAL | Status: DC

## 2013-07-25 MED ORDER — HYDROCHLOROTHIAZIDE 25 MG PO TABS: 25.0000 mg | ORAL_TABLET | Freq: Every day | ORAL | Status: DC

## 2013-07-25 MED ORDER — METFORMIN HCL 1000 MG PO TABS: ORAL_TABLET | ORAL | Status: DC

## 2013-07-25 MED ORDER — LISINOPRIL 20 MG PO TABS: 20.0000 mg | ORAL_TABLET | Freq: Every day | ORAL | Status: DC

## 2013-07-25 MED ORDER — INSULIN GLARGINE 100 UNIT/ML SOLOSTAR PEN: 30.0000 [IU] | PEN_INJECTOR | Freq: Every day | SUBCUTANEOUS | Status: DC

## 2013-07-25 MED ORDER — DULOXETINE HCL 60 MG PO CPEP: 60.0000 mg | ORAL_CAPSULE | Freq: Every day | ORAL | Status: DC

## 2013-07-25 NOTE — Telephone Encounter (Signed)
Medication refilled per protocol. 

## 2014-02-02 ENCOUNTER — Other Ambulatory Visit: Payer: Self-pay | Admitting: *Deleted

## 2014-02-02 MED ORDER — LISINOPRIL 20 MG PO TABS: 20.0000 mg | ORAL_TABLET | Freq: Every day | ORAL | Status: DC

## 2014-02-02 NOTE — Telephone Encounter (Signed)
Medication filled x1 with no refills.   Requires office visit before any further refills can be given.   Letter sent.  

## 2014-02-03 ENCOUNTER — Other Ambulatory Visit: Payer: Self-pay | Admitting: *Deleted

## 2014-02-03 DIAGNOSIS — E1165 Type 2 diabetes mellitus with hyperglycemia: Secondary | ICD-10-CM

## 2014-02-03 DIAGNOSIS — I10 Essential (primary) hypertension: Secondary | ICD-10-CM

## 2014-02-03 DIAGNOSIS — E119 Type 2 diabetes mellitus without complications: Secondary | ICD-10-CM

## 2014-02-03 DIAGNOSIS — E785 Hyperlipidemia, unspecified: Secondary | ICD-10-CM

## 2014-02-03 DIAGNOSIS — IMO0002 Reserved for concepts with insufficient information to code with codable children: Secondary | ICD-10-CM

## 2014-02-13 ENCOUNTER — Other Ambulatory Visit: Payer: BC Managed Care – PPO

## 2014-02-19 ENCOUNTER — Ambulatory Visit: Payer: Self-pay | Admitting: Family Medicine

## 2014-02-19 ENCOUNTER — Ambulatory Visit: Payer: BC Managed Care – PPO | Admitting: Family Medicine

## 2014-03-07 ENCOUNTER — Other Ambulatory Visit: Payer: Self-pay

## 2014-03-07 DIAGNOSIS — E785 Hyperlipidemia, unspecified: Secondary | ICD-10-CM

## 2014-03-07 DIAGNOSIS — E1165 Type 2 diabetes mellitus with hyperglycemia: Secondary | ICD-10-CM

## 2014-03-07 DIAGNOSIS — IMO0002 Reserved for concepts with insufficient information to code with codable children: Secondary | ICD-10-CM

## 2014-03-07 DIAGNOSIS — I10 Essential (primary) hypertension: Secondary | ICD-10-CM

## 2014-03-07 DIAGNOSIS — E119 Type 2 diabetes mellitus without complications: Secondary | ICD-10-CM

## 2014-03-07 LAB — COMPLETE METABOLIC PANEL WITH GFR
ALBUMIN: 4.1 g/dL (ref 3.5–5.2)
ALT: 9 U/L (ref 0–35)
AST: 13 U/L (ref 0–37)
Alkaline Phosphatase: 115 U/L (ref 39–117)
BUN: 11 mg/dL (ref 6–23)
CALCIUM: 9.4 mg/dL (ref 8.4–10.5)
CHLORIDE: 97 meq/L (ref 96–112)
CO2: 26 mEq/L (ref 19–32)
Creat: 0.72 mg/dL (ref 0.50–1.10)
GFR, Est African American: >89 mL/min
GFR, Est Non African American: >89 mL/min
Glucose, Bld: 294 mg/dL — ABNORMAL HIGH (ref 70–99)
POTASSIUM: 3.6 meq/L (ref 3.5–5.3)
Sodium: 137 mEq/L (ref 135–145)
Total Bilirubin: 0.6 mg/dL (ref 0.2–1.2)
Total Protein: 7.2 g/dL (ref 6.0–8.3)

## 2014-03-07 LAB — CBC WITH DIFFERENTIAL/PLATELET
BASOS ABS: 0 10*3/uL (ref 0.0–0.1)
Basophils Relative: 1 % (ref 0–1)
Eosinophils Absolute: 0.2 10*3/uL (ref 0.0–0.7)
Eosinophils Relative: 4 % (ref 0–5)
HEMATOCRIT: 37.5 % (ref 36.0–46.0)
Hemoglobin: 13.3 g/dL (ref 12.0–15.0)
LYMPHS PCT: 43 % (ref 12–46)
Lymphs Abs: 2 10*3/uL (ref 0.7–4.0)
MCH: 26.8 pg (ref 26.0–34.0)
MCHC: 35.5 g/dL (ref 30.0–36.0)
MCV: 75.6 fL — ABNORMAL LOW (ref 78.0–100.0)
MONOS PCT: 9 % (ref 3–12)
Monocytes Absolute: 0.4 10*3/uL (ref 0.1–1.0)
NEUTROS ABS: 2 10*3/uL (ref 1.7–7.7)
Neutrophils Relative %: 43 % (ref 43–77)
Platelets: 366 10*3/uL (ref 150–400)
RBC: 4.96 MIL/uL (ref 3.87–5.11)
RDW: 13.6 % (ref 11.5–15.5)
WBC: 4.7 10*3/uL (ref 4.0–10.5)

## 2014-03-07 LAB — LIPID PANEL
Cholesterol: 249 mg/dL — ABNORMAL HIGH (ref 0–200)
HDL: 47 mg/dL (ref >39)
LDL CALC: 156 mg/dL — AB (ref 0–99)
Total CHOL/HDL Ratio: 5.3 Ratio
Triglycerides: 232 mg/dL — ABNORMAL HIGH (ref <150)
VLDL: 46 mg/dL — AB (ref 0–40)

## 2014-03-07 LAB — HEMOGLOBIN A1C
HEMOGLOBIN A1C: 12.1 % — AB (ref <5.7)
Mean Plasma Glucose: 301 mg/dL — ABNORMAL HIGH (ref <117)

## 2014-03-13 ENCOUNTER — Encounter: Payer: Self-pay | Admitting: Family Medicine

## 2014-03-13 ENCOUNTER — Ambulatory Visit (INDEPENDENT_AMBULATORY_CARE_PROVIDER_SITE_OTHER): Payer: Self-pay | Admitting: Family Medicine

## 2014-03-13 VITALS — BP 130/74 | HR 72 | Temp 98.1°F | Resp 16 | Ht 62.0 in | Wt 161.0 lb

## 2014-03-13 DIAGNOSIS — R634 Abnormal weight loss: Secondary | ICD-10-CM

## 2014-03-13 DIAGNOSIS — Z91199 Patient's noncompliance with other medical treatment and regimen due to unspecified reason: Secondary | ICD-10-CM

## 2014-03-13 DIAGNOSIS — J40 Bronchitis, not specified as acute or chronic: Secondary | ICD-10-CM

## 2014-03-13 DIAGNOSIS — Z9114 Patient's other noncompliance with medication regimen: Secondary | ICD-10-CM

## 2014-03-13 DIAGNOSIS — I10 Essential (primary) hypertension: Secondary | ICD-10-CM

## 2014-03-13 DIAGNOSIS — IMO0001 Reserved for inherently not codable concepts without codable children: Secondary | ICD-10-CM

## 2014-03-13 DIAGNOSIS — Z9119 Patient's noncompliance with other medical treatment and regimen: Secondary | ICD-10-CM

## 2014-03-13 DIAGNOSIS — F17200 Nicotine dependence, unspecified, uncomplicated: Secondary | ICD-10-CM

## 2014-03-13 DIAGNOSIS — Z91148 Patient's other noncompliance with medication regimen for other reason: Secondary | ICD-10-CM

## 2014-03-13 DIAGNOSIS — Z720 Tobacco use: Secondary | ICD-10-CM

## 2014-03-13 DIAGNOSIS — E1165 Type 2 diabetes mellitus with hyperglycemia: Secondary | ICD-10-CM

## 2014-03-13 DIAGNOSIS — IMO0002 Reserved for concepts with insufficient information to code with codable children: Secondary | ICD-10-CM

## 2014-03-13 DIAGNOSIS — E785 Hyperlipidemia, unspecified: Secondary | ICD-10-CM

## 2014-03-13 MED ORDER — LEVOFLOXACIN 500 MG PO TABS: 500.0000 mg | ORAL_TABLET | Freq: Every day | ORAL | Status: DC

## 2014-03-13 MED ORDER — ALBUTEROL SULFATE (2.5 MG/3ML) 0.083% IN NEBU
2.5000 mg | INHALATION_SOLUTION | Freq: Once | RESPIRATORY_TRACT | Status: AC
  Administered 2014-03-13: 2.5 mg via RESPIRATORY_TRACT

## 2014-03-13 MED ORDER — PREDNISONE 20 MG PO TABS: 40.0000 mg | ORAL_TABLET | Freq: Every day | ORAL | Status: DC

## 2014-03-13 MED ORDER — ROSUVASTATIN CALCIUM 10 MG PO TABS: 10.0000 mg | ORAL_TABLET | Freq: Every day | ORAL | Status: DC

## 2014-03-13 MED ORDER — METHYLPREDNISOLONE ACETATE 40 MG/ML IJ SUSP
40.0000 mg | Freq: Once | INTRAMUSCULAR | Status: AC
  Administered 2014-03-13: 40 mg via INTRAMUSCULAR

## 2014-03-13 MED ORDER — GUAIFENESIN-CODEINE 100-10 MG/5ML PO SOLN: 10.0000 mL | Freq: Four times a day (QID) | ORAL | Status: DC | PRN

## 2014-03-13 MED ORDER — ALBUTEROL SULFATE HFA 108 (90 BASE) MCG/ACT IN AERS: 2.0000 | INHALATION_SPRAY | RESPIRATORY_TRACT | Status: DC | PRN

## 2014-03-13 NOTE — Patient Instructions (Addendum)
Increase lantus to 25 units Take antibiotics, steroids as prescribed, cough medicine New cholesterol medication Crestor  Check blood sugar fasting and before meals  Get the chest xray done F/U 4 weeks with meter

## 2014-03-13 NOTE — Progress Notes (Signed)
Patient ID: Kelly Gibson, female   DOB: 09/06/1960, 53 y.o.   MRN: [id number]   Subjective:    Patient ID: Kelly Gibson, female    DOB: 01/24/1961, 53 y.o.   MRN: [id number]  Patient presents for F/U DM and Bronchitis  Patient here to followup diabetes mellitus and chronic medical problems. She's not been seen since November of last year she is noncompliant with her medication regimen her A1c last return severely uncontrolled. She states that she restart her Lantus last week with the nurse call she's been taking 20 units at bedtime. She states she's been taken off her oral medications as prescribed.  Bronchitis she's had wheezing cough with production worsening of the past 3 weeks. She's also had some shortness of breath. She continues to smoke is a she's tried to quit. She's actually lost 22 pounds unintentionally in the past 9 months as well    Review Of Systems:  GEN- denies fatigue, fever, +weight loss,weakness,+ recent illness HEENT- denies eye drainage, change in vision, nasal discharge, CVS- denies chest pain, palpitations RESP- denies SOB,+ cough,+ wheeze ABD- denies N/V, change in stools, abd pain GU- denies dysuria, hematuria, dribbling, incontinence MSK- denies joint pain, muscle aches, injury Neuro- denies headache, dizziness, syncope, seizure activity       Objective:    BP 130/74  Pulse 72  Temp(Src) 98.1 F (36.7 C) (Oral)  Resp 16  Ht 5\' 2"  (1.575 m)  Wt 161 lb (73.029 kg)  BMI 29.44 kg/m2  LMP 12/10/2012 GEN- NAD, alert and oriented x3 HEENT- PERRL, EOMI, non injected sclera, pink conjunctiva, MMM, oropharynx clear Neck- Supple, no thyromegaly CVS- RRR, no murmur RESP-bilat wheeze, +rhonchi, normal WOB, no rales ABD-NABS,soft,NT,ND EXT- No edema Pulses- Radial, DP- 2+        Assessment & Plan:      Problem List Items Addressed This Visit   None      Note: This dictation was prepared with Dragon dictation along with smaller phrase  technology. Any transcriptional errors that result from this process are unintentional.

## 2014-03-15 DIAGNOSIS — R634 Abnormal weight loss: Secondary | ICD-10-CM | POA: Insufficient documentation

## 2014-03-15 NOTE — Assessment & Plan Note (Signed)
PFT needed Treat with depo Medrol., prednisone, antibiotics, needs CXR especially as she is a smoker and weight loss noted

## 2014-03-15 NOTE — Assessment & Plan Note (Signed)
Non compliance with regimen, could be 2/2 DM, lung pathology as smoker, colonoscopy and mammo Overdue Will f/u after labs and xray

## 2014-03-15 NOTE — Assessment & Plan Note (Signed)
Changed to crestor 10

## 2014-03-15 NOTE — Assessment & Plan Note (Signed)
Discussed importance of tobacco cessation. 

## 2014-03-15 NOTE — Assessment & Plan Note (Signed)
>>  ASSESSMENT AND PLAN FOR TYPE 2 DIABETES MELLITUS WITH DIABETIC NEUROPATHY, WITH LONG-TERM CURRENT USE OF INSULIN  (HCC) WRITTEN ON 03/15/2014  2:45 PM BY [place], KAWANTA F  Uncontrolled, labs today, increase lantus  to 25 units, continue oral medications

## 2014-03-15 NOTE — Assessment & Plan Note (Signed)
Uncontrolled, labs today, increase lantus to 25 units, continue oral medications

## 2014-03-27 ENCOUNTER — Ambulatory Visit
Admission: RE | Admit: 2014-03-27 | Discharge: 2014-03-27 | Disposition: A | Discharge status: Home / Self Care | Payer: Managed Care, Other (non HMO) | Source: Ambulatory Visit | Attending: Family Medicine | Admitting: Family Medicine

## 2014-03-27 DIAGNOSIS — J40 Bronchitis, not specified as acute or chronic: Secondary | ICD-10-CM

## 2014-03-28 ENCOUNTER — Other Ambulatory Visit: Payer: Self-pay | Admitting: *Deleted

## 2014-03-28 DIAGNOSIS — F17200 Nicotine dependence, unspecified, uncomplicated: Secondary | ICD-10-CM

## 2014-03-28 DIAGNOSIS — J440 Chronic obstructive pulmonary disease with acute lower respiratory infection: Secondary | ICD-10-CM

## 2014-04-10 ENCOUNTER — Telehealth: Payer: Self-pay | Admitting: *Deleted

## 2014-04-10 MED ORDER — LISINOPRIL 20 MG PO TABS: 20.0000 mg | ORAL_TABLET | Freq: Every day | ORAL | Status: DC

## 2014-04-10 MED ORDER — ATORVASTATIN CALCIUM 40 MG PO TABS: 40.0000 mg | ORAL_TABLET | Freq: Every day | ORAL | Status: DC

## 2014-04-10 NOTE — Telephone Encounter (Signed)
Prescription sent to pharmacy.

## 2014-04-10 NOTE — Telephone Encounter (Signed)
Received fax requesting refill on Lisinopril.   Refill appropriate and filled per protocol.  Also received fax from pharmacy stating that patient is reporting that Crestor is too expensive and would like to resume Pravastatin 40mg  PO QHS.   MD please advise.

## 2014-04-10 NOTE — Telephone Encounter (Signed)
Lipitor 40mg  at bedtime

## 2014-04-23 ENCOUNTER — Ambulatory Visit (HOSPITAL_COMMUNITY): Payer: Self-pay

## 2014-05-01 ENCOUNTER — Ambulatory Visit (HOSPITAL_COMMUNITY)
Admission: RE | Admit: 2014-05-01 | Discharge: 2014-05-01 | Disposition: A | Discharge status: Home / Self Care | Payer: Self-pay | Source: Ambulatory Visit | Attending: Family Medicine | Admitting: Family Medicine

## 2014-05-01 DIAGNOSIS — J449 Chronic obstructive pulmonary disease, unspecified: Secondary | ICD-10-CM | POA: Insufficient documentation

## 2014-05-01 DIAGNOSIS — J440 Chronic obstructive pulmonary disease with acute lower respiratory infection: Secondary | ICD-10-CM

## 2014-05-01 DIAGNOSIS — F17200 Nicotine dependence, unspecified, uncomplicated: Secondary | ICD-10-CM

## 2014-05-01 DIAGNOSIS — R05 Cough: Secondary | ICD-10-CM | POA: Insufficient documentation

## 2014-05-01 DIAGNOSIS — Z720 Tobacco use: Secondary | ICD-10-CM | POA: Insufficient documentation

## 2014-05-01 LAB — PULMONARY FUNCTION TEST
DL/VA % pred: 93 %
DL/VA: 4.36 ml/min/mmHg/L
DLCO COR % PRED: 66 %
DLCO cor: 15.3 ml/min/mmHg
DLCO unc % pred: 66 %
DLCO unc: 15.3 ml/min/mmHg
FEF 25-75 Post: 0.63 L/sec
FEF 25-75 Pre: 0.68 L/sec
FEF2575-%CHANGE-POST: -8 %
FEF2575-%PRED-PRE: 29 %
FEF2575-%Pred-Post: 27 %
FEV1-%Change-Post: -7 %
FEV1-%PRED-PRE: 62 %
FEV1-%Pred-Post: 57 %
FEV1-POST: 1.27 L
FEV1-Pre: 1.36 L
FEV1FVC-%Change-Post: 0 %
FEV1FVC-%Pred-Pre: 66 %
FEV6-%CHANGE-POST: -4 %
FEV6-%Pred-Post: 88 %
FEV6-%Pred-Pre: 92 %
FEV6-PRE: 2.47 L
FEV6-Post: 2.35 L
FEV6FVC-%Change-Post: 1 %
FEV6FVC-%PRED-PRE: 101 %
FEV6FVC-%Pred-Post: 102 %
FVC-%Change-Post: -6 %
FVC-%Pred-Post: 85 %
FVC-%Pred-Pre: 91 %
FVC-PRE: 2.52 L
FVC-Post: 2.36 L
POST FEV1/FVC RATIO: 54 %
Post FEV6/FVC ratio: 100 %
Pre FEV1/FVC ratio: 54 %
Pre FEV6/FVC Ratio: 98 %
RV % pred: 137 %
RV: 2.43 L
TLC % pred: 90 %
TLC: 4.42 L

## 2014-05-01 MED ORDER — ALBUTEROL SULFATE (2.5 MG/3ML) 0.083% IN NEBU
2.5000 mg | INHALATION_SOLUTION | Freq: Once | RESPIRATORY_TRACT | Status: AC
  Administered 2014-05-01: 2.5 mg via RESPIRATORY_TRACT

## 2014-08-07 ENCOUNTER — Telehealth: Payer: Self-pay | Admitting: Family Medicine

## 2014-08-07 MED ORDER — HYDROCHLOROTHIAZIDE 25 MG PO TABS: 25.0000 mg | ORAL_TABLET | Freq: Every day | ORAL | Status: DC

## 2014-08-07 MED ORDER — METFORMIN HCL 1000 MG PO TABS: ORAL_TABLET | ORAL | Status: DC

## 2014-08-07 MED ORDER — LISINOPRIL 20 MG PO TABS: 20.0000 mg | ORAL_TABLET | Freq: Every day | ORAL | Status: DC

## 2014-08-07 MED ORDER — DULOXETINE HCL 60 MG PO CPEP: 60.0000 mg | ORAL_CAPSULE | Freq: Every day | ORAL | Status: DC

## 2014-08-07 MED ORDER — GLYBURIDE 2.5 MG PO TABS: ORAL_TABLET | ORAL | Status: DC

## 2014-08-07 NOTE — Telephone Encounter (Signed)
[phone] PT is needing a refill on hydrochlorothiazide (HYDRODIURIL) 25 MG tablet  glyBURIDE (DIABETA) 2.5 MG tablet  metFORMIN (GLUCOPHAGE) 1000 MG tablet she is also needing a refill on her cholesterol medication, she does not want the Lipitor because it is to expensive she would like to go back on what she was on prior to that [employer]

## 2014-08-07 NOTE — Telephone Encounter (Signed)
Returned call to patient to inquire.   Reports that she would like to refill Pravastatin. Documentation shows that Pravastatin was ineffective at controlling lipids. Medication was changes to Crestor in 02/2014, but noted to be too expensive for patient to afford. Medication changed to Lipitor at that time. No further concerns heard from patient in 02/2014.  Patient now states that she could not afford Lipitor and has not begun therapy.   MD please advise.    Patient also requested additional refills of Lisinopril and Cymbalta. All requested medications filled x1 with no refills. Requires office visit before any further refills can be given. Patient made aware and verbalized understanding. States that she will contact office to schedule appointment.

## 2014-08-07 NOTE — Telephone Encounter (Signed)
lipitor is generic and should be same cost as other cholesterol medication, please verify with pharmacy

## 2014-08-08 MED ORDER — SIMVASTATIN 80 MG PO TABS: 80.0000 mg | ORAL_TABLET | Freq: Every day | ORAL | Status: DC

## 2014-08-08 NOTE — Telephone Encounter (Signed)
Call placed to pharmacy.   Reports that patient has not ever filled prescription. Was advised that prescription cost $48.75.  MD to be made aware.

## 2014-08-08 NOTE — Telephone Encounter (Signed)
You can send zocor 80mg , but this will not be as effective for her cholesterol

## 2014-08-08 NOTE — Telephone Encounter (Signed)
Prescription sent to pharmacy. .   Call placed to patient and patient made aware.  

## 2014-08-20 ENCOUNTER — Encounter: Payer: Self-pay | Admitting: Family Medicine

## 2014-09-26 ENCOUNTER — Encounter: Payer: Self-pay | Admitting: Family Medicine

## 2015-02-18 ENCOUNTER — Ambulatory Visit (INDEPENDENT_AMBULATORY_CARE_PROVIDER_SITE_OTHER): Payer: Self-pay | Admitting: Family Medicine

## 2015-02-18 ENCOUNTER — Encounter: Payer: Self-pay | Admitting: Family Medicine

## 2015-02-18 VITALS — BP 140/88 | HR 78 | Temp 98.7°F | Resp 14 | Ht 62.0 in | Wt 155.0 lb

## 2015-02-18 DIAGNOSIS — I10 Essential (primary) hypertension: Secondary | ICD-10-CM

## 2015-02-18 DIAGNOSIS — Z720 Tobacco use: Secondary | ICD-10-CM

## 2015-02-18 DIAGNOSIS — J410 Simple chronic bronchitis: Secondary | ICD-10-CM

## 2015-02-18 DIAGNOSIS — J449 Chronic obstructive pulmonary disease, unspecified: Secondary | ICD-10-CM | POA: Insufficient documentation

## 2015-02-18 DIAGNOSIS — Z9114 Patient's other noncompliance with medication regimen: Secondary | ICD-10-CM

## 2015-02-18 DIAGNOSIS — J441 Chronic obstructive pulmonary disease with (acute) exacerbation: Secondary | ICD-10-CM | POA: Insufficient documentation

## 2015-02-18 DIAGNOSIS — E1165 Type 2 diabetes mellitus with hyperglycemia: Secondary | ICD-10-CM

## 2015-02-18 DIAGNOSIS — R634 Abnormal weight loss: Secondary | ICD-10-CM

## 2015-02-18 DIAGNOSIS — Z23 Encounter for immunization: Secondary | ICD-10-CM

## 2015-02-18 DIAGNOSIS — E785 Hyperlipidemia, unspecified: Secondary | ICD-10-CM

## 2015-02-18 DIAGNOSIS — Z91148 Patient's other noncompliance with medication regimen for other reason: Secondary | ICD-10-CM

## 2015-02-18 DIAGNOSIS — IMO0002 Reserved for concepts with insufficient information to code with codable children: Secondary | ICD-10-CM

## 2015-02-18 LAB — CBC W/MCH & 3 PART DIFF
HEMATOCRIT: 37.3 % (ref 36.0–46.0)
Hemoglobin: 13 g/dL (ref 12.0–15.0)
Lymphocytes Relative: 36 % (ref 12–46)
Lymphs Abs: 1.6 10*3/uL (ref 0.7–4.0)
MCH: 27.7 pg (ref 26.0–34.0)
MCHC: 34.9 g/dL (ref 30.0–36.0)
MCV: 79.4 fL (ref 78.0–100.0)
Neutro Abs: 2.6 10*3/uL (ref 1.7–7.7)
Neutrophils Relative %: 57 % (ref 43–77)
Platelets: 334 10*3/uL (ref 150–400)
RBC: 4.7 MIL/uL (ref 3.87–5.11)
RDW: 14.1 % (ref 11.5–15.5)
WBC mixed population %: 7 % (ref 3–18)
WBC: 4.5 10*3/uL (ref 4.0–10.5)
WBCMIX: 0.3 10*3/uL (ref 0.1–1.8)

## 2015-02-18 LAB — COMPREHENSIVE METABOLIC PANEL
ALT: 7 U/L (ref 6–29)
AST: 13 U/L (ref 10–35)
Albumin: 4.3 g/dL (ref 3.6–5.1)
Alkaline Phosphatase: 91 U/L (ref 33–130)
BILIRUBIN TOTAL: 0.4 mg/dL (ref 0.2–1.2)
BUN: 17 mg/dL (ref 7–25)
CO2: 26 mmol/L (ref 20–31)
Calcium: 9.7 mg/dL (ref 8.6–10.4)
Chloride: 94 mmol/L — ABNORMAL LOW (ref 98–110)
Creat: 0.76 mg/dL (ref 0.50–1.05)
GLUCOSE: 339 mg/dL — AB (ref 70–99)
Potassium: 3.2 mmol/L — ABNORMAL LOW (ref 3.5–5.3)
Sodium: 135 mmol/L (ref 135–146)
Total Protein: 7.3 g/dL (ref 6.1–8.1)

## 2015-02-18 LAB — LDL CHOLESTEROL, DIRECT: Direct LDL: 190 mg/dL — ABNORMAL HIGH (ref <130)

## 2015-02-18 LAB — TSH: TSH: 1.274 u[IU]/mL (ref 0.350–4.500)

## 2015-02-18 LAB — HEMOGLOBIN A1C, FINGERSTICK: Hgb A1C (fingerstick): 8.1 % — ABNORMAL HIGH (ref <5.7)

## 2015-02-18 MED ORDER — METFORMIN HCL 500 MG PO TABS: ORAL_TABLET | ORAL | Status: DC

## 2015-02-18 MED ORDER — VARENICLINE TARTRATE 0.5 MG X 11 & 1 MG X 42 PO MISC: ORAL | Status: DC

## 2015-02-18 MED ORDER — HYDROCHLOROTHIAZIDE 25 MG PO TABS: 25.0000 mg | ORAL_TABLET | Freq: Every day | ORAL | Status: DC

## 2015-02-18 MED ORDER — GLYBURIDE 2.5 MG PO TABS: ORAL_TABLET | ORAL | Status: DC

## 2015-02-18 MED ORDER — SIMVASTATIN 80 MG PO TABS: 80.0000 mg | ORAL_TABLET | Freq: Every day | ORAL | Status: DC

## 2015-02-18 MED ORDER — UMECLIDINIUM BROMIDE 62.5 MCG/INH IN AEPB: 1.0000 | INHALATION_SPRAY | Freq: Every day | RESPIRATORY_TRACT | Status: DC

## 2015-02-18 NOTE — Assessment & Plan Note (Signed)
?   If MTF, due to diabetes, smoking, I  would get CT of chest, she has also never had cancer screening done check colonoscopy/ overdue for mammogram

## 2015-02-18 NOTE — Addendum Note (Signed)
Addended by: Sheral Flow on: 02/18/2015 10:33 AM   Modules accepted: Orders

## 2015-02-18 NOTE — Assessment & Plan Note (Signed)
Trial of Incruse Ellipta, given sample from office, tobacco cessation needed

## 2015-02-18 NOTE — Assessment & Plan Note (Signed)
Her systolic is a little elevated today however with the multiple things that we are addressing today, to keep her blood pressure medication the same reevaluate on her recheck

## 2015-02-18 NOTE — Progress Notes (Signed)
Patient ID: Kelly Gibson, female   DOB: 1961-03-08, 54 y.o.   MRN: [id number]   Subjective:    Patient ID: Kelly Gibson, female    DOB: 1961/02/09, 54 y.o.   MRN: [id number]  Patient presents for Medication Review/ Refill  patient here for follow-up. She's been noncompliant with medications as well as follow-up. She was last seen in August 2015. She has a diagnosis of COPD she continues to smoke about half a pack per day she also has a chronic cough. She states that she is been eating less but still was not trying to lose weight. Her weight is down 6 pounds since her last visit she has lost about 20 pounds in the past 2 years. Diabetes mellitus she has not been using her insulin she states the metformin gives her upset stomach but she has been taking this along with glyburide. She's not been checking her blood sugars. She does admit to polyuria and polydipsia.    Review Of Systems:  GEN- denies fatigue, fever, weight loss,weakness, recent illness HEENT- denies eye drainage, change in vision, nasal discharge, CVS- denies chest pain, palpitations RESP- denies SOB, +cough, +wheeze ABD- denies N/V, change in stools, abd pain GU- denies dysuria, hematuria, dribbling, incontinence MSK- denies joint pain, muscle aches, injury Neuro- denies headache, dizziness, syncope, seizure activity       Objective:    BP 140/88 mmHg  Pulse 78  Temp(Src) 98.7 F (37.1 C) (Oral)  Resp 14  Ht 5\' 2"  (1.575 m)  Wt 155 lb (70.308 kg)  BMI 28.34 kg/m2  LMP 12/10/2012 GEN- NAD, alert and oriented x3 HEENT- PERRL, EOMI, non injected sclera, pink conjunctiva, MMM, oropharynx clear Neck- Supple, no thyromegaly CVS- RRR, no murmur RESP-Bilat wheeze, normal WOB, no retractions, good air movement  ABD-NABS,soft,NT,ND EXT- No edema Pulses- Radial, DP- 2+        Assessment & Plan:      Problem List Items Addressed This Visit    Tobacco user   Loss of weight   Hyperlipidemia   Relevant Orders    LDL Cholesterol, Direct   Essential hypertension, benign   Relevant Orders   TSH   Diabetes mellitus type II, uncontrolled - Primary   Relevant Orders   CBC with Differential/Platelet   Comprehensive metabolic panel   Hemoglobin A1C, fingerstick   Microalbumin / creatinine urine ratio      Note: This dictation was prepared with Dragon dictation along with smaller phrase technology. Any transcriptional errors that result from this process are unintentional.

## 2015-02-18 NOTE — Assessment & Plan Note (Signed)
She's been off of her insulin for the past 2 months despite that her A1c is at 8.1% today. Other labs are still pending. I may decrease her metformin to 500 mg twice a day and will continue the glyburide I'm going to put her on Toujeo 10 units at bedtime Lowering the dose. This is also more cost effective for her

## 2015-02-18 NOTE — Assessment & Plan Note (Signed)
>>  ASSESSMENT AND PLAN FOR ESSENTIAL HYPERTENSION, BENIGN WRITTEN ON 02/18/2015 10:27 AM BY Milinda Antis F  Her systolic is a little elevated today however with the multiple things that we are addressing today, to keep her blood pressure medication the same reevaluate on her recheck

## 2015-02-18 NOTE — Assessment & Plan Note (Signed)
Chest the importance of her taking medications as prescribed and following up with her appointments.

## 2015-02-18 NOTE — Patient Instructions (Addendum)
Decrease metformin to 500mg  twice a day  Start with 10 units of Toujeo  We will call with other results Try the Incruse for your breathing Chantix for the smoking  Check blood sugar twice a day   F/U 4 weeks with meter

## 2015-02-18 NOTE — Assessment & Plan Note (Signed)
Counseled on tobacco cessation she is willing to try Chantix she does not quit smoking she is going to progress her COPD and will eventually need oxygen therapy

## 2015-02-18 NOTE — Assessment & Plan Note (Signed)
>>  ASSESSMENT AND PLAN FOR TYPE 2 DIABETES MELLITUS WITH DIABETIC NEUROPATHY, WITH LONG-TERM CURRENT USE OF INSULIN  (HCC) WRITTEN ON 02/18/2015 10:27 AM BY [place], KAWANTA F  She's been off of her insulin  for the past 2 months despite that her A1c is at 8.1% today. Other labs are still pending. I may decrease her metformin  to 500 mg twice a day and will continue the glyburide  I'm going to put her on Toujeo  10 units at bedtime Lowering the dose. This is also more cost effective for her

## 2015-02-21 ENCOUNTER — Other Ambulatory Visit: Payer: Self-pay | Admitting: *Deleted

## 2015-02-21 MED ORDER — ATORVASTATIN CALCIUM 40 MG PO TABS: 40.0000 mg | ORAL_TABLET | Freq: Every day | ORAL | Status: DC

## 2015-02-21 MED ORDER — POTASSIUM CHLORIDE CRYS ER 20 MEQ PO TBCR: 20.0000 meq | EXTENDED_RELEASE_TABLET | Freq: Every day | ORAL | Status: DC

## 2015-02-22 ENCOUNTER — Other Ambulatory Visit: Payer: Self-pay | Admitting: *Deleted

## 2015-02-22 MED ORDER — ROSUVASTATIN CALCIUM 10 MG PO TABS: 10.0000 mg | ORAL_TABLET | Freq: Every day | ORAL | Status: DC

## 2015-03-19 ENCOUNTER — Ambulatory Visit: Payer: Self-pay | Admitting: Family Medicine

## 2015-04-03 ENCOUNTER — Telehealth: Payer: Self-pay | Admitting: Family Medicine

## 2015-04-03 NOTE — Telephone Encounter (Signed)
Patient made aware and savings cards mailed to patient.

## 2015-04-03 NOTE — Telephone Encounter (Signed)
She can buy Nicoderm system over the counter- since she doesn't have insurance - She can try to apply for patient assistance through [place] Also there is a coupon for 30 day free trial

## 2015-04-03 NOTE — Telephone Encounter (Signed)
Call placed to patient.   States that she can not afford Chantix and would like to try Nicotine patches.   Also states that she can not afford Incruse Ellipta ($290/month) and requested MD to advise. Reports that she does not have prescription coverage, only discount savings card.   MD to be made aware.

## 2015-04-03 NOTE — Telephone Encounter (Signed)
[facility] [address]  Patient is calling to say that the chantix is too expensive and would like to know if something else can be called in for her  And also has questions about her inhaler  [phone]

## 2015-12-13 ENCOUNTER — Encounter: Payer: Self-pay | Admitting: Family Medicine

## 2015-12-13 ENCOUNTER — Ambulatory Visit (INDEPENDENT_AMBULATORY_CARE_PROVIDER_SITE_OTHER): Payer: BLUE CROSS/BLUE SHIELD | Admitting: Family Medicine

## 2015-12-13 VITALS — BP 180/100 | HR 100 | Temp 98.9°F | Resp 24 | Ht 62.0 in | Wt 148.0 lb

## 2015-12-13 DIAGNOSIS — Z1159 Encounter for screening for other viral diseases: Secondary | ICD-10-CM

## 2015-12-13 DIAGNOSIS — Z9114 Patient's other noncompliance with medication regimen: Secondary | ICD-10-CM

## 2015-12-13 DIAGNOSIS — I10 Essential (primary) hypertension: Secondary | ICD-10-CM

## 2015-12-13 DIAGNOSIS — IMO0002 Reserved for concepts with insufficient information to code with codable children: Secondary | ICD-10-CM

## 2015-12-13 DIAGNOSIS — J441 Chronic obstructive pulmonary disease with (acute) exacerbation: Secondary | ICD-10-CM | POA: Diagnosis not present

## 2015-12-13 DIAGNOSIS — Z720 Tobacco use: Secondary | ICD-10-CM | POA: Diagnosis not present

## 2015-12-13 DIAGNOSIS — E1165 Type 2 diabetes mellitus with hyperglycemia: Secondary | ICD-10-CM | POA: Diagnosis not present

## 2015-12-13 DIAGNOSIS — E118 Type 2 diabetes mellitus with unspecified complications: Secondary | ICD-10-CM

## 2015-12-13 DIAGNOSIS — E669 Obesity, unspecified: Secondary | ICD-10-CM | POA: Diagnosis not present

## 2015-12-13 DIAGNOSIS — Z794 Long term (current) use of insulin: Secondary | ICD-10-CM

## 2015-12-13 MED ORDER — GLYBURIDE 2.5 MG PO TABS: ORAL_TABLET | ORAL | Status: DC

## 2015-12-13 MED ORDER — DOXYCYCLINE HYCLATE 100 MG PO TABS: 100.0000 mg | ORAL_TABLET | Freq: Two times a day (BID) | ORAL | Status: DC

## 2015-12-13 MED ORDER — LISINOPRIL 20 MG PO TABS: 20.0000 mg | ORAL_TABLET | Freq: Every day | ORAL | Status: DC

## 2015-12-13 MED ORDER — METHYLPREDNISOLONE ACETATE 80 MG/ML IJ SUSP
80.0000 mg | Freq: Once | INTRAMUSCULAR | Status: AC
  Administered 2015-12-13: 80 mg via INTRAMUSCULAR

## 2015-12-13 MED ORDER — ALBUTEROL SULFATE HFA 108 (90 BASE) MCG/ACT IN AERS: 2.0000 | INHALATION_SPRAY | RESPIRATORY_TRACT | Status: DC | PRN

## 2015-12-13 MED ORDER — IPRATROPIUM-ALBUTEROL 0.5-2.5 (3) MG/3ML IN SOLN
3.0000 mL | Freq: Once | RESPIRATORY_TRACT | Status: AC
  Administered 2015-12-13: 3 mL via RESPIRATORY_TRACT

## 2015-12-13 MED ORDER — PREDNISONE 10 MG PO TABS: ORAL_TABLET | ORAL | Status: DC

## 2015-12-13 MED ORDER — HYDROCHLOROTHIAZIDE 25 MG PO TABS: 25.0000 mg | ORAL_TABLET | Freq: Every day | ORAL | Status: DC

## 2015-12-13 MED ORDER — METFORMIN HCL 500 MG PO TABS: ORAL_TABLET | ORAL | Status: DC

## 2015-12-13 NOTE — Assessment & Plan Note (Signed)
>>  ASSESSMENT AND PLAN FOR TYPE 2 DIABETES MELLITUS WITH DIABETIC NEUROPATHY, WITH LONG-TERM CURRENT USE OF INSULIN  (HCC) WRITTEN ON 12/13/2015  5:58 PM BY [place], KAWANTA F  DM uncontrolled, due to non compliance, voiced plans of her following up on a regular basis Plan to continue her metformin  and her glyburide  wall so restart her blood pressure medication she will follow up in 2 weeks A1c drawn today will restart her insulin  pending results

## 2015-12-13 NOTE — Assessment & Plan Note (Signed)
Blood pressure uncontrolled due to noncompliance of medications. Medication sent to pharmacy today

## 2015-12-13 NOTE — Progress Notes (Signed)
Patient ID: Kelly Gibson, female   DOB: 12/21/1960, 55 y.o.   MRN: [id number]    Subjective:    Patient ID: Kelly Gibson, female    DOB: 01/11/1961, 55 y.o.   MRN: [id number]  Patient presents for Medication Review/ Refill and Wheezing Patient here with difficulty breathing and wheezing cough with production worsening over the past 3 weeks. She has history of COPD she does have some albuterol at home she's been using sparingly but she does not have the Incruse. She does continue to smoke   She has been lost to follow-up since last July she has diabetes mellitus which has been uncontrolled she states that she is still taking her diabetes pills every now and then she has not been on insulin in many months she is having off on her blood pressure medicine a least a month. She states when she last checked her blood sugar it was 198    Review Of Systems:  GEN- denies fatigue, fever, weight loss,weakness, recent illness HEENT- denies eye drainage, change in vision, nasal discharge, CVS- denies chest pain, palpitations RESP- + SOB, +cough, +wheeze ABD- denies N/V, change in stools, abd pain GU- denies dysuria, hematuria, dribbling, incontinence MSK- denies joint pain, muscle aches, injury Neuro- denies headache, dizziness, syncope, seizure activity       Objective:    BP 180/100 mmHg  Pulse 100  Temp(Src) 98.9 F (37.2 C) (Oral)  Resp 24  Ht 5\' 2"  (1.575 m)  Wt 148 lb (67.132 kg)  BMI 27.06 kg/m2  SpO2 97%  LMP 12/10/2012 GEN- NAD, alert and oriented x3 HEENT- PERRL, EOMI, non injected sclera, pink conjunctiva, MMM, oropharynx clear,clear rhinorrhea Neck- Supple, no LAD CVS- RRR, no murmur RESP- bilat wheeze, decrease air movement at bases, rhonchi, audible wheeze, no retractions  EXT- No edema Pulses- Radial- 2+        Assessment & Plan:      Problem List Items Addressed This Visit    Tobacco user   Obesity, unspecified   Relevant Medications   metFORMIN  (GLUCOPHAGE) 500 MG tablet   glyBURIDE (DIABETA) 2.5 MG tablet   Non compliance w medication regimen   Essential hypertension, benign    Blood pressure uncontrolled due to noncompliance of medications. Medication sent to pharmacy today      Relevant Medications   hydrochlorothiazide (HYDRODIURIL) 25 MG tablet   lisinopril (PRINIVIL,ZESTRIL) 20 MG tablet   Diabetes mellitus type II, uncontrolled ([place]) - Primary    DM uncontrolled, due to non compliance, voiced plans of her following up on a regular basis Plan to continue her metformin and her glyburide wall so restart her blood pressure medication she will follow up in 2 weeks A1c drawn today will restart her insulin pending results      Relevant Medications   lisinopril (PRINIVIL,ZESTRIL) 20 MG tablet   metFORMIN (GLUCOPHAGE) 500 MG tablet   glyBURIDE (DIABETA) 2.5 MG tablet   Other Relevant Orders   CBC with Differential/Platelet   Comprehensive metabolic panel   Hemoglobin A1c   LDL Cholesterol, Direct    Other Visit Diagnoses    COPD exacerbation ([place])        s/p neb improved air movement ans bronchospasm, given Depo Medrol, Start prednisone taper, doxycycline, albuterol q 4 hours    Relevant Medications    predniSONE (DELTASONE) 10 MG tablet    albuterol (PROVENTIL HFA;VENTOLIN HFA) 108 (90 Base) MCG/ACT inhaler    methylPREDNISolone acetate (DEPO-MEDROL) injection 80 mg (Completed)    ipratropium-albuterol (  DUONEB) 0.5-2.5 (3) MG/3ML nebulizer solution 3 mL (Completed)    Need for hepatitis C screening test        Relevant Orders    Hepatitis C Ab Reflex HCV RNA, QUANT       Note: This dictation was prepared with Dragon dictation along with smaller phrase technology. Any transcriptional errors that result from this process are unintentional.

## 2015-12-13 NOTE — Patient Instructions (Addendum)
Take 2 puffs every 4 hours of albuterol Restart diabetes medications and blood pressure medications We will call with labs  F/U 2 weeks - last of day

## 2015-12-13 NOTE — Assessment & Plan Note (Signed)
DM uncontrolled, due to non compliance, voiced plans of her following up on a regular basis Plan to continue her metformin and her glyburide wall so restart her blood pressure medication she will follow up in 2 weeks A1c drawn today will restart her insulin pending results

## 2015-12-13 NOTE — Assessment & Plan Note (Signed)
>>  ASSESSMENT AND PLAN FOR ESSENTIAL HYPERTENSION, BENIGN WRITTEN ON 12/13/2015  5:58 PM BY [place], KAWANTA F  Blood pressure uncontrolled due to noncompliance of medications. Medication sent to pharmacy today

## 2015-12-14 LAB — CBC WITH DIFFERENTIAL/PLATELET
Basophils Absolute: 54 cells/uL (ref 0–200)
Basophils Relative: 1 %
EOS ABS: 108 {cells}/uL (ref 15–500)
EOS PCT: 2 %
HEMATOCRIT: 35.8 % (ref 35.0–45.0)
HEMOGLOBIN: 12.2 g/dL (ref 12.0–15.0)
LYMPHS ABS: 1998 {cells}/uL (ref 850–3900)
Lymphocytes Relative: 37 %
MCH: 27.4 pg (ref 27.0–33.0)
MCHC: 34.1 g/dL (ref 32.0–36.0)
MCV: 80.3 fL (ref 80.0–100.0)
MONOS PCT: 8 %
MPV: 9.4 fL (ref 7.5–12.5)
Monocytes Absolute: 432 cells/uL (ref 200–950)
NEUTROS ABS: 2808 {cells}/uL (ref 1500–7800)
Neutrophils Relative %: 52 %
Platelets: 337 10*3/uL (ref 140–400)
RBC: 4.46 MIL/uL (ref 3.80–5.10)
RDW: 14.4 % (ref 11.0–15.0)
WBC: 5.4 10*3/uL (ref 3.8–10.8)

## 2015-12-14 LAB — COMPREHENSIVE METABOLIC PANEL
ALBUMIN: 4.1 g/dL (ref 3.6–5.1)
ALK PHOS: 96 U/L (ref 33–130)
ALT: 7 U/L (ref 6–29)
AST: 12 U/L (ref 10–35)
BILIRUBIN TOTAL: 0.3 mg/dL (ref 0.2–1.2)
BUN: 19 mg/dL (ref 7–25)
CALCIUM: 9.3 mg/dL (ref 8.6–10.4)
CO2: 28 mmol/L (ref 20–31)
CREATININE: 0.76 mg/dL (ref 0.50–1.05)
Chloride: 97 mmol/L — ABNORMAL LOW (ref 98–110)
Glucose, Bld: 271 mg/dL — ABNORMAL HIGH (ref 70–99)
Potassium: 3.4 mmol/L — ABNORMAL LOW (ref 3.5–5.3)
Sodium: 139 mmol/L (ref 135–146)
TOTAL PROTEIN: 7.3 g/dL (ref 6.1–8.1)

## 2015-12-14 LAB — HEMOGLOBIN A1C
Hgb A1c MFr Bld: 9.9 % — ABNORMAL HIGH (ref <5.7)
Mean Plasma Glucose: 237 mg/dL

## 2015-12-14 LAB — LDL CHOLESTEROL, DIRECT: Direct LDL: 217 mg/dL — ABNORMAL HIGH (ref <130)

## 2015-12-14 LAB — HEPATITIS C ANTIBODY: HCV Ab: NEGATIVE

## 2015-12-19 ENCOUNTER — Other Ambulatory Visit: Payer: Self-pay | Admitting: *Deleted

## 2015-12-19 MED ORDER — ROSUVASTATIN CALCIUM 10 MG PO TABS: 10.0000 mg | ORAL_TABLET | Freq: Every day | ORAL | Status: DC

## 2015-12-27 ENCOUNTER — Ambulatory Visit (INDEPENDENT_AMBULATORY_CARE_PROVIDER_SITE_OTHER): Payer: BLUE CROSS/BLUE SHIELD | Admitting: Family Medicine

## 2015-12-27 ENCOUNTER — Encounter: Payer: Self-pay | Admitting: Family Medicine

## 2015-12-27 VITALS — BP 148/92 | HR 86 | Temp 97.8°F | Resp 16 | Ht 62.0 in | Wt 151.0 lb

## 2015-12-27 DIAGNOSIS — E1165 Type 2 diabetes mellitus with hyperglycemia: Secondary | ICD-10-CM

## 2015-12-27 DIAGNOSIS — Z794 Long term (current) use of insulin: Secondary | ICD-10-CM | POA: Diagnosis not present

## 2015-12-27 DIAGNOSIS — IMO0002 Reserved for concepts with insufficient information to code with codable children: Secondary | ICD-10-CM

## 2015-12-27 DIAGNOSIS — Z720 Tobacco use: Secondary | ICD-10-CM | POA: Diagnosis not present

## 2015-12-27 DIAGNOSIS — I10 Essential (primary) hypertension: Secondary | ICD-10-CM

## 2015-12-27 DIAGNOSIS — J410 Simple chronic bronchitis: Secondary | ICD-10-CM

## 2015-12-27 DIAGNOSIS — E118 Type 2 diabetes mellitus with unspecified complications: Secondary | ICD-10-CM | POA: Diagnosis not present

## 2015-12-27 NOTE — Assessment & Plan Note (Signed)
Blood pressure is significantly improved. I will continue her on the same dose of medication for now. Also given handouts on low-sodium and diabetic diet

## 2015-12-27 NOTE — Assessment & Plan Note (Signed)
Uncontrolled diabetes mellitus. Doesn't consider giving her an HDL to however she does not want take any further pills. She said she would rather give herself injections. On increase her insulin up to 15 units the metformin at maximum of 500 mg twice a day because she has diarrhea at higher doses.

## 2015-12-27 NOTE — Patient Instructions (Addendum)
Keep blood pressure medication the same, goal is < 140/80 Continue Incruse Work on the smoking  Increase the Tujeo to 15units Keep Metformin and Glyburide the same  F/U 2 months

## 2015-12-27 NOTE — Assessment & Plan Note (Signed)
>>  ASSESSMENT AND PLAN FOR TYPE 2 DIABETES MELLITUS WITH DIABETIC NEUROPATHY, WITH LONG-TERM CURRENT USE OF INSULIN  (HCC) WRITTEN ON 12/27/2015  1:37 PM BY [place], KAWANTA F  Uncontrolled diabetes mellitus. Doesn't consider giving her an HDL to however she does not want take any further pills. She said she would rather give herself injections. On increase her insulin  up to 15 units the metformin  at maximum of 500 mg twice a day because she has diarrhea at higher doses.

## 2015-12-27 NOTE — Progress Notes (Signed)
Patient ID: Kelly Gibson, female   DOB: 1961/02/28, 55 y.o.   MRN: [id number]   Subjective:    Patient ID: Kelly Gibson, female    DOB: 1961-01-27, 55 y.o.   MRN: [id number]  Patient presents for F/U Patient here for interim follow-up. She was seen 2 weeks ago to reestablish her medications. COPD she was treated for an exacerbation she is now using her Incruse states her breathing is much improved. She is not using albuterol very much. She does continue to smoke and is not interested in quitting at this time. Patient  Diabetes mellitus A1c was 9.9% she is currently on Tujeo 10 units at bedtime she is also on metformin 500 mg twice a day and glyburide. Her blood sugars further meter seven-day average 1 8214 day average 234 the readings are not very consistent she has some morning some evening her blood sugars are fluctuating from about 150s to 300  Hypertension blood pressure was significantly elevated she was started back on hydrochlorothiazide and lisinopril  No new concerns     Review Of Systems:  GEN- denies fatigue, fever, weight loss,weakness, recent illness HEENT- denies eye drainage, change in vision, nasal discharge, CVS- denies chest pain, palpitations RESP- denies SOB, cough, wheeze ABD- denies N/V, change in stools, abd pain GU- denies dysuria, hematuria, dribbling, incontinence MSK- denies joint pain, muscle aches, injury Neuro- denies headache, dizziness, syncope, seizure activity       Objective:    BP 148/92 mmHg  Pulse 86  Temp(Src) 97.8 F (36.6 C) (Oral)  Resp 16  Ht 5\' 2"  (1.575 m)  Wt 151 lb (68.493 kg)  BMI 27.61 kg/m2  SpO2 96%  LMP 12/10/2012 GEN- NAD, alert and oriented x3 CVS- RRR, no murmur RESP-scattered wheeze, good air movement, normal WOB  EXT- No edema Pulses- Radial  2+        Assessment & Plan:      Problem List Items Addressed This Visit    Essential hypertension, benign   Diabetes mellitus type II, uncontrolled ([employer]) -  Primary   Relevant Orders   Microalbumin / creatinine urine ratio   COPD (chronic obstructive pulmonary disease) ([place])      Note: This dictation was prepared with Dragon dictation along with smaller phrase technology. Any transcriptional errors that result from this process are unintentional.

## 2015-12-27 NOTE — Assessment & Plan Note (Signed)
>>  ASSESSMENT AND PLAN FOR ESSENTIAL HYPERTENSION, BENIGN WRITTEN ON 12/27/2015  1:36 PM BY [place], KAWANTA F  Blood pressure is significantly improved. I will continue her on the same dose of medication for now. Also given handouts on low-sodium and diabetic diet

## 2015-12-27 NOTE — Assessment & Plan Note (Signed)
Improved since exacerbation she still has wheezing still smokes no interested in quitting. I will continue her on the Incruse she thinks this helps with her breathing

## 2015-12-28 LAB — MICROALBUMIN / CREATININE URINE RATIO
CREATININE, URINE: 71 mg/dL (ref 20–320)
Microalb Creat Ratio: 77 mcg/mg creat — ABNORMAL HIGH (ref <30)
Microalb, Ur: 5.5 mg/dL

## 2015-12-31 ENCOUNTER — Telehealth: Payer: Self-pay

## 2015-12-31 NOTE — Telephone Encounter (Signed)
Patient aware of results and recommendations. °

## 2015-12-31 NOTE — Telephone Encounter (Signed)
-----   Message from Alycia Rossetti, MD sent at 12/29/2015  8:02 PM EDT ----- She does have some mild protein in urine, keeping sugar down and taking lisinopril should help reverse this

## 2016-03-23 ENCOUNTER — Telehealth: Payer: Self-pay | Admitting: Family Medicine

## 2016-03-23 NOTE — Telephone Encounter (Signed)
Pt wants to know if Dr Buelah Manis gave her a Hepatitis shot.  No Hepatitis vaccines seen in medial record or state registry.  Tried to call pt but no answer, no voice mail.

## 2016-05-15 ENCOUNTER — Encounter: Payer: Self-pay | Admitting: Family Medicine

## 2016-05-15 ENCOUNTER — Ambulatory Visit (INDEPENDENT_AMBULATORY_CARE_PROVIDER_SITE_OTHER): Payer: 59 | Admitting: Family Medicine

## 2016-05-15 VITALS — BP 132/78 | HR 90 | Temp 97.7°F | Resp 18 | Ht 62.0 in | Wt 155.0 lb

## 2016-05-15 DIAGNOSIS — E119 Type 2 diabetes mellitus without complications: Secondary | ICD-10-CM

## 2016-05-15 DIAGNOSIS — J0100 Acute maxillary sinusitis, unspecified: Secondary | ICD-10-CM

## 2016-05-15 DIAGNOSIS — I10 Essential (primary) hypertension: Secondary | ICD-10-CM | POA: Diagnosis not present

## 2016-05-15 DIAGNOSIS — Z794 Long term (current) use of insulin: Secondary | ICD-10-CM | POA: Diagnosis not present

## 2016-05-15 DIAGNOSIS — J441 Chronic obstructive pulmonary disease with (acute) exacerbation: Secondary | ICD-10-CM

## 2016-05-15 DIAGNOSIS — IMO0002 Reserved for concepts with insufficient information to code with codable children: Secondary | ICD-10-CM

## 2016-05-15 DIAGNOSIS — E785 Hyperlipidemia, unspecified: Secondary | ICD-10-CM | POA: Diagnosis not present

## 2016-05-15 DIAGNOSIS — E1165 Type 2 diabetes mellitus with hyperglycemia: Secondary | ICD-10-CM | POA: Diagnosis not present

## 2016-05-15 DIAGNOSIS — E118 Type 2 diabetes mellitus with unspecified complications: Secondary | ICD-10-CM | POA: Diagnosis not present

## 2016-05-15 LAB — COMPLETE METABOLIC PANEL WITH GFR
ALT: 5 U/L — AB (ref 6–29)
AST: 7 U/L — AB (ref 10–35)
Albumin: 3.5 g/dL — ABNORMAL LOW (ref 3.6–5.1)
Alkaline Phosphatase: 97 U/L (ref 33–130)
BUN: 14 mg/dL (ref 7–25)
CALCIUM: 9 mg/dL (ref 8.6–10.4)
CHLORIDE: 93 mmol/L — AB (ref 98–110)
CO2: 32 mmol/L — AB (ref 20–31)
Creat: 0.83 mg/dL (ref 0.50–1.05)
GFR, Est Non African American: 81 mL/min (ref >=60)
Glucose, Bld: 475 mg/dL (ref 70–99)
POTASSIUM: 3.6 mmol/L (ref 3.5–5.3)
Sodium: 136 mmol/L (ref 135–146)
Total Bilirubin: 0.3 mg/dL (ref 0.2–1.2)
Total Protein: 6.9 g/dL (ref 6.1–8.1)

## 2016-05-15 LAB — HEMOGLOBIN A1C
HEMOGLOBIN A1C: 10.2 % — AB (ref <5.7)
MEAN PLASMA GLUCOSE: 246 mg/dL

## 2016-05-15 LAB — LIPID PANEL
CHOL/HDL RATIO: 5.2 ratio — AB (ref <=5.0)
CHOLESTEROL: 233 mg/dL — AB (ref 125–200)
HDL: 45 mg/dL — AB (ref >=46)
LDL Cholesterol: 153 mg/dL — ABNORMAL HIGH (ref <130)
TRIGLYCERIDES: 175 mg/dL — AB (ref <150)
VLDL: 35 mg/dL — ABNORMAL HIGH (ref <30)

## 2016-05-15 MED ORDER — HYDROCHLOROTHIAZIDE 25 MG PO TABS: 25.0000 mg | ORAL_TABLET | Freq: Every day | ORAL | 6 refills | Status: DC

## 2016-05-15 MED ORDER — IPRATROPIUM-ALBUTEROL 0.5-2.5 (3) MG/3ML IN SOLN
3.0000 mL | Freq: Once | RESPIRATORY_TRACT | Status: AC
  Administered 2016-05-15: 3 mL via RESPIRATORY_TRACT

## 2016-05-15 MED ORDER — INSULIN GLARGINE 300 UNIT/ML ~~LOC~~ SOPN: 15.0000 [IU] | PEN_INJECTOR | Freq: Every day | SUBCUTANEOUS | 6 refills | Status: DC

## 2016-05-15 MED ORDER — LISINOPRIL 20 MG PO TABS: 20.0000 mg | ORAL_TABLET | Freq: Every day | ORAL | 6 refills | Status: DC

## 2016-05-15 MED ORDER — GLYBURIDE 2.5 MG PO TABS: ORAL_TABLET | ORAL | 6 refills | Status: DC

## 2016-05-15 MED ORDER — PREDNISONE 10 MG PO TABS: ORAL_TABLET | ORAL | 0 refills | Status: DC

## 2016-05-15 MED ORDER — UMECLIDINIUM BROMIDE 62.5 MCG/INH IN AEPB: 1.0000 | INHALATION_SPRAY | Freq: Every day | RESPIRATORY_TRACT | 6 refills | Status: DC

## 2016-05-15 MED ORDER — ROSUVASTATIN CALCIUM 10 MG PO TABS: 10.0000 mg | ORAL_TABLET | Freq: Every day | ORAL | 1 refills | Status: DC

## 2016-05-15 MED ORDER — DOXYCYCLINE HYCLATE 100 MG PO TABS: 100.0000 mg | ORAL_TABLET | Freq: Two times a day (BID) | ORAL | 0 refills | Status: DC

## 2016-05-15 MED ORDER — METFORMIN HCL 500 MG PO TABS: ORAL_TABLET | ORAL | 6 refills | Status: DC

## 2016-05-15 MED ORDER — GUAIFENESIN-CODEINE 100-10 MG/5ML PO SOLN: 5.0000 mL | Freq: Four times a day (QID) | ORAL | 0 refills | Status: DC | PRN

## 2016-05-15 MED ORDER — ALBUTEROL SULFATE HFA 108 (90 BASE) MCG/ACT IN AERS: 2.0000 | INHALATION_SPRAY | RESPIRATORY_TRACT | 0 refills | Status: DC | PRN

## 2016-05-15 MED ORDER — METHYLPREDNISOLONE ACETATE 40 MG/ML IJ SUSP
40.0000 mg | Freq: Once | INTRAMUSCULAR | Status: AC
  Administered 2016-05-15: 40 mg via INTRAMUSCULAR

## 2016-05-15 NOTE — Assessment & Plan Note (Signed)
>>  ASSESSMENT AND PLAN FOR ESSENTIAL HYPERTENSION, BENIGN WRITTEN ON 05/15/2016 10:05 AM BY [place], KAWANTA F  Controlled no change to meds

## 2016-05-15 NOTE — Assessment & Plan Note (Signed)
Controlled no change to meds

## 2016-05-15 NOTE — Progress Notes (Signed)
   Subjective:    Patient ID: Kelly Gibson, female    DOB: [birth year], 55 y.o.   MRN: [id number]  Patient presents for Illness (x2 weeks- productive cough with green colored mucus, wheezing, fever, sore throat)  Pt here with Cough with mild production over the past 2 weeks. She is history of underlying COPD she ran out of her albuterol she has not been using the increase she continues to smoke. She's been using over-the-counter cough medicine with no improvement. She's had a wheezing low-grade fever and sore throat. She's also had sinus pressure and drainage which started her symptoms. She is now working at a nursing home.  She also has diabetes mellitus her last A1c 9.9% she supposed be on to Tujeo 15 units along with metformin she states that she is injecting this. Takes her blood sugars are good always less than 200    Review Of Systems:  GEN-+ fatigue,+ fever, weight loss,weakness, recent illness HEENT- denies eye drainage, change in vision,+ nasal discharge, CVS- denies chest pain, palpitations RESP- denies SOB, +cough,+ wheeze ABD- denies N/V, change in stools, abd pain GU- denies dysuria, hematuria, dribbling, incontinence MSK- denies joint pain, muscle aches, injury Neuro- denies headache, dizziness, syncope, seizure activity       Objective:    BP 132/78 (BP Location: Left Arm, Patient Position: Sitting, Cuff Size: Normal)   Pulse 90   Temp 97.7 F (36.5 C) (Oral)   Resp 18   Ht 5\' 2"  (1.575 m)   Wt 155 lb (70.3 kg)   LMP 12/10/2012   SpO2 96% Comment: RA  BMI 28.35 kg/m  GEN- NAD, alert and oriented x3 HEENT- PERRL, EOMI, non injected sclera, pink conjunctiva, MMM, oropharynx clear  TM clear bilat no effusion,  + maxillary sinus tenderness, inflammed turbinates,  Nasal drainage  Neck- Supple, shotty ant  LAD CVS- RRR, no murmur RESP-bilat wheeze, mild congestion, normal WOB, but audible wheeze  EXT- No edema Pulses- Radial 2+         Assessment & Plan:       Problem List Items Addressed This Visit    Diabetes mellitus type II, uncontrolled ([place])    dicusssed once again coming in for her apppointments Check G0F , metabolic panel today        Other Visit Diagnoses    COPD exacerbation ([place])    -  Primary   Improved with neb in office, Depo Medrol IM, start prednisone taper, doxycycline antibiotics, believe sinusitis may have proceeded this, restart albuterol, incruse Advised to go up on insulin by 1-2 units for elevated blood sugar while on prednisone as well  Recommend flu shot    Acute maxillary sinusitis, recurrence not specified       Acute sinusitis, doxycycline per above, smoker       Note: This dictation was prepared with Dragon dictation along with smaller phrase technology. Any transcriptional errors that result from this process are unintentional.

## 2016-05-15 NOTE — Assessment & Plan Note (Signed)
>>  ASSESSMENT AND PLAN FOR TYPE 2 DIABETES MELLITUS WITH DIABETIC NEUROPATHY, WITH LONG-TERM CURRENT USE OF INSULIN  (HCC) WRITTEN ON 05/15/2016 10:02 AM BY [place], KAWANTA F  dicusssed once again coming in for her apppointments Check A1C , metabolic panel today

## 2016-05-15 NOTE — Addendum Note (Signed)
Addended by: Sheral Flow on: 05/15/2016 10:48 AM   Modules accepted: Orders

## 2016-05-15 NOTE — Patient Instructions (Signed)
Take prednisone starting tomorrow Start antbiotics and cough medicine Restart your inhalers  Increase insulin by 1-2 units to keep blood sugar down We will call with lab results  F/U 3 months

## 2016-05-15 NOTE — Assessment & Plan Note (Signed)
dicusssed once again coming in for her apppointments Check N3Z , metabolic panel today

## 2016-05-16 LAB — CBC WITH DIFFERENTIAL/PLATELET
BASOS PCT: 1 %
Basophils Absolute: 65 cells/uL (ref 0–200)
Eosinophils Absolute: 130 cells/uL (ref 15–500)
Eosinophils Relative: 2 %
HEMATOCRIT: 34.1 % — AB (ref 35.0–45.0)
Hemoglobin: 11.3 g/dL — ABNORMAL LOW (ref 12.0–15.0)
LYMPHS PCT: 28 %
Lymphs Abs: 1820 cells/uL (ref 850–3900)
MCH: 27.6 pg (ref 27.0–33.0)
MCHC: 33.1 g/dL (ref 32.0–36.0)
MCV: 83.4 fL (ref 80.0–100.0)
MONO ABS: 325 {cells}/uL (ref 200–950)
MONOS PCT: 5 %
MPV: 9 fL (ref 7.5–12.5)
NEUTROS PCT: 64 %
Neutro Abs: 4160 cells/uL (ref 1500–7800)
PLATELETS: 445 10*3/uL — AB (ref 140–400)
RBC: 4.09 MIL/uL (ref 3.80–5.10)
RDW: 12.8 % (ref 11.0–15.0)
WBC: 6.5 10*3/uL (ref 3.8–10.8)

## 2016-08-15 ENCOUNTER — Encounter (HOSPITAL_COMMUNITY): Payer: Self-pay | Admitting: Emergency Medicine

## 2016-08-15 ENCOUNTER — Emergency Department (HOSPITAL_COMMUNITY)
Admission: EM | Admit: 2016-08-15 | Discharge: 2016-08-16 | Disposition: A | Discharge status: Home / Self Care | Payer: Self-pay | Attending: Emergency Medicine | Admitting: Emergency Medicine

## 2016-08-15 ENCOUNTER — Emergency Department (HOSPITAL_COMMUNITY): Payer: Self-pay

## 2016-08-15 DIAGNOSIS — E119 Type 2 diabetes mellitus without complications: Secondary | ICD-10-CM | POA: Insufficient documentation

## 2016-08-15 DIAGNOSIS — Z794 Long term (current) use of insulin: Secondary | ICD-10-CM | POA: Insufficient documentation

## 2016-08-15 DIAGNOSIS — J441 Chronic obstructive pulmonary disease with (acute) exacerbation: Secondary | ICD-10-CM | POA: Insufficient documentation

## 2016-08-15 DIAGNOSIS — F17200 Nicotine dependence, unspecified, uncomplicated: Secondary | ICD-10-CM | POA: Insufficient documentation

## 2016-08-15 DIAGNOSIS — I10 Essential (primary) hypertension: Secondary | ICD-10-CM | POA: Insufficient documentation

## 2016-08-15 LAB — CBC WITH DIFFERENTIAL/PLATELET
BASOS ABS: 0 10*3/uL (ref 0.0–0.1)
Basophils Relative: 0 %
EOS PCT: 1 %
Eosinophils Absolute: 0.1 10*3/uL (ref 0.0–0.7)
HCT: 39.6 % (ref 36.0–46.0)
Hemoglobin: 13.8 g/dL (ref 12.0–15.0)
LYMPHS PCT: 19 %
Lymphs Abs: 1.4 10*3/uL (ref 0.7–4.0)
MCH: 27.9 pg (ref 26.0–34.0)
MCHC: 34.8 g/dL (ref 30.0–36.0)
MCV: 80.2 fL (ref 78.0–100.0)
Monocytes Absolute: 0.3 10*3/uL (ref 0.1–1.0)
Monocytes Relative: 3 %
Neutro Abs: 5.8 10*3/uL (ref 1.7–7.7)
Neutrophils Relative %: 77 %
PLATELETS: 297 10*3/uL (ref 150–400)
RBC: 4.94 MIL/uL (ref 3.87–5.11)
RDW: 13 % (ref 11.5–15.5)
WBC: 7.6 10*3/uL (ref 4.0–10.5)

## 2016-08-15 LAB — COMPREHENSIVE METABOLIC PANEL
ALT: 13 U/L — AB (ref 14–54)
AST: 21 U/L (ref 15–41)
Albumin: 3.5 g/dL (ref 3.5–5.0)
Alkaline Phosphatase: 97 U/L (ref 38–126)
Anion gap: 11 (ref 5–15)
BUN: 9 mg/dL (ref 6–20)
CHLORIDE: 92 mmol/L — AB (ref 101–111)
CO2: 27 mmol/L (ref 22–32)
CREATININE: 0.84 mg/dL (ref 0.44–1.00)
Calcium: 8.7 mg/dL — ABNORMAL LOW (ref 8.9–10.3)
GFR calc Af Amer: >60 mL/min (ref >60)
GFR calc non Af Amer: >60 mL/min (ref >60)
GLUCOSE: 430 mg/dL — AB (ref 65–99)
Potassium: 2.9 mmol/L — ABNORMAL LOW (ref 3.5–5.1)
SODIUM: 130 mmol/L — AB (ref 135–145)
Total Bilirubin: 0.6 mg/dL (ref 0.3–1.2)
Total Protein: 7.3 g/dL (ref 6.5–8.1)

## 2016-08-15 MED ORDER — IPRATROPIUM BROMIDE 0.02 % IN SOLN
1.0000 mg | Freq: Once | RESPIRATORY_TRACT | Status: AC
  Administered 2016-08-16: 1 mg via RESPIRATORY_TRACT
  Filled 2016-08-15: qty 5

## 2016-08-15 MED ORDER — ALBUTEROL (5 MG/ML) CONTINUOUS INHALATION SOLN
10.0000 mg/h | INHALATION_SOLUTION | RESPIRATORY_TRACT | Status: DC
  Administered 2016-08-16: 10 mg/h via RESPIRATORY_TRACT
  Filled 2016-08-15: qty 20

## 2016-08-15 MED ORDER — ALBUTEROL SULFATE HFA 108 (90 BASE) MCG/ACT IN AERS
2.0000 | INHALATION_SPRAY | Freq: Once | RESPIRATORY_TRACT | Status: DC
  Filled 2016-08-15: qty 6.7

## 2016-08-15 MED ORDER — ALBUTEROL SULFATE (2.5 MG/3ML) 0.083% IN NEBU
5.0000 mg | INHALATION_SOLUTION | Freq: Once | RESPIRATORY_TRACT | Status: AC
  Administered 2016-08-15: 5 mg via RESPIRATORY_TRACT

## 2016-08-15 MED ORDER — ALBUTEROL SULFATE (2.5 MG/3ML) 0.083% IN NEBU
INHALATION_SOLUTION | RESPIRATORY_TRACT | Status: AC
  Filled 2016-08-15: qty 6

## 2016-08-15 MED ORDER — POTASSIUM CHLORIDE CRYS ER 20 MEQ PO TBCR
60.0000 meq | EXTENDED_RELEASE_TABLET | Freq: Once | ORAL | Status: AC
  Administered 2016-08-16: 60 meq via ORAL
  Filled 2016-08-15: qty 3

## 2016-08-15 MED ORDER — METHYLPREDNISOLONE SODIUM SUCC 125 MG IJ SOLR
125.0000 mg | Freq: Once | INTRAMUSCULAR | Status: AC
  Administered 2016-08-16: 125 mg via INTRAVENOUS
  Filled 2016-08-15: qty 2

## 2016-08-15 MED ORDER — MAGNESIUM SULFATE 2 GM/50ML IV SOLN
2.0000 g | Freq: Once | INTRAVENOUS | Status: AC
  Administered 2016-08-16: 2 g via INTRAVENOUS
  Filled 2016-08-15: qty 50

## 2016-08-15 MED ORDER — AZITHROMYCIN 250 MG PO TABS
500.0000 mg | ORAL_TABLET | Freq: Once | ORAL | Status: AC
  Administered 2016-08-16: 500 mg via ORAL
  Filled 2016-08-15: qty 2

## 2016-08-15 NOTE — ED Notes (Signed)
Pt presents with SOB since 4 pm.  Pt got breathing treatment out in triage.  Pt sts it helped.

## 2016-08-15 NOTE — ED Provider Notes (Signed)
[facility] DEPT Provider Note   CSN: [id number] Arrival date & time: 08/15/16  1844   By signing my name below, I, Eunice Blase, attest that this documentation has been prepared under the direction and in the presence of Everlene Balls, MD. Electronically signed, Eunice Blase, ED Scribe. 08/15/16. 12:00 AM.   History   Chief Complaint Chief Complaint  Patient presents with  . Cough  . Wheezing   The history is provided by the patient, the spouse and medical records. No language interpreter was used.    HPI Comments: Kelly Gibson is a 56 y.o. female who presents to the Emergency Department complaining of SOB x 2 weeks. Notes Hx of COPD. She reports associated rhinorrhea, intermittent productive cough, mild subjective fever and wheezes. Notes mild relief to SOB with breathing treatment in triage. Also states she ran out of albuterol 2-3 days ago. Spouse states he just got the pt's incruse ellipta refilled, and further notes pt experiences mild baseline wheezes. She denies any sick contacts.    Past Medical History:  Diagnosis Date  . COPD (chronic obstructive pulmonary disease) ([place])   . Depression   . Diabetes mellitus   . Hyperlipidemia   . Hypertension     Patient Active Problem List   Diagnosis Date Noted  . COPD (chronic obstructive pulmonary disease) ([place]) 02/18/2015  . Loss of weight 03/15/2014  . Non compliance w medication regimen 06/17/2013  . Post-menopause bleeding 06/16/2013  . Bronchitis 05/04/2012  . Tobacco user 05/04/2012  . Ganglion cyst 03/09/2012  . Anxiety 03/09/2012  . Essential hypertension, benign 12/17/2011  . Obesity, unspecified 12/17/2011  . Hyperlipidemia 12/17/2011  . Depression 12/17/2011  . Diabetes mellitus type II, uncontrolled ([place]) 12/14/2011    Past Surgical History:  Procedure Laterality Date  . CESAREAN SECTION     x 3  . TUMOR REMOVAL     left shoulder    OB History    No data available       Home Medications     Prior to Admission medications   Medication Sig Start Date End Date Taking? Authorizing Provider  albuterol (PROVENTIL HFA;VENTOLIN HFA) 108 (90 Base) MCG/ACT inhaler Inhale 2 puffs into the lungs every 4 (four) hours as needed for wheezing or shortness of breath. 05/15/16   Alycia Rossetti, MD  doxycycline (VIBRA-TABS) 100 MG tablet Take 1 tablet (100 mg total) by mouth 2 (two) times daily. 05/15/16   Alycia Rossetti, MD  glucose blood test strip Use as instructed 01/18/13   Alycia Rossetti, MD  glyBURIDE (DIABETA) 2.5 MG tablet One tab daily 05/15/16   Alycia Rossetti, MD  guaiFENesin-codeine 100-10 MG/5ML syrup Take 5 mLs by mouth every 6 (six) hours as needed. 05/15/16   Alycia Rossetti, MD  hydrochlorothiazide (HYDRODIURIL) 25 MG tablet Take 1 tablet (25 mg total) by mouth daily. 05/15/16   Alycia Rossetti, MD  Insulin Glargine (TOUJEO SOLOSTAR) 300 UNIT/ML SOPN Inject 15 Units into the skin at bedtime. 05/15/16   Alycia Rossetti, MD  lisinopril (PRINIVIL,ZESTRIL) 20 MG tablet Take 1 tablet (20 mg total) by mouth daily. 05/15/16   Alycia Rossetti, MD  metFORMIN (GLUCOPHAGE) 500 MG tablet TAKE 1 TABLET BY MOUTH TWICE DAILY 05/15/16   Alycia Rossetti, MD  potassium chloride SA (K-DUR,KLOR-CON) 20 MEQ tablet Take 1 tablet (20 mEq total) by mouth daily. 02/21/15   Alycia Rossetti, MD  predniSONE (DELTASONE) 10 MG tablet Take 40mg  x 3 days,20mg  x 3  days, 10mg  x 3 days 05/15/16   Alycia Rossetti, MD  rosuvastatin (CRESTOR) 10 MG tablet Take 1 tablet (10 mg total) by mouth daily. 05/15/16   Alycia Rossetti, MD  umeclidinium bromide (INCRUSE ELLIPTA) 62.5 MCG/INH AEPB Inhale 1 puff into the lungs daily. 05/15/16   Alycia Rossetti, MD    Family History Family History  Problem Relation Age of Onset  . Diabetes Mother   . Heart disease Father   . Cancer Father     Social History Social History  Substance Use Topics  . Smoking status: Current Every Day Smoker  . Smokeless  tobacco: Never Used  . Alcohol use No     Allergies   Niacin and related   Review of Systems Review of Systems A complete 10 system review of systems was obtained and all systems are negative except as noted in the HPI and PMH.    Physical Exam Updated Vital Signs BP (!) 154/116 (BP Location: Right Arm)   Pulse 115   Temp 99.2 F (37.3 C) (Oral)   Resp 18   Ht 5\' 3"  (1.6 m)   Wt 150 lb (68 kg)   LMP 12/10/2012   SpO2 98%   BMI 26.57 kg/m   Physical Exam  Constitutional: She is oriented to person, place, and time. She appears well-developed and well-nourished. No distress.  HENT:  Head: Normocephalic and atraumatic.  Nose: Nose normal.  Mouth/Throat: Oropharynx is clear and moist. No oropharyngeal exudate.  Eyes: Conjunctivae and EOM are normal. Pupils are equal, round, and reactive to light. No scleral icterus.  Neck: Normal range of motion. Neck supple. No JVD present. No tracheal deviation present. No thyromegaly present.  Cardiovascular: Normal rate, regular rhythm and normal heart sounds.  Exam reveals no gallop and no friction rub.   No murmur heard. Pulmonary/Chest: She has wheezes (inspiratory and expiratory).  Abdominal: Soft. Bowel sounds are normal. She exhibits no distension and no mass. There is no tenderness. There is no rebound and no guarding.  Musculoskeletal: Normal range of motion. She exhibits no edema or tenderness.  Lymphadenopathy:    She has no cervical adenopathy.  Neurological: She is alert and oriented to person, place, and time. No cranial nerve deficit. She exhibits normal muscle tone.  Skin: Skin is warm and dry. No rash noted. No erythema. No pallor.  Nursing note and vitals reviewed.    ED Treatments / Results  DIAGNOSTIC STUDIES: Oxygen Saturation is 98% on RA, normal by my interpretation.    COORDINATION OF CARE: 12:00 AM Discussed treatment plan with pt at bedside and pt agreed to plan.  Labs (all labs ordered are listed, but  only abnormal results are displayed) Labs Reviewed  COMPREHENSIVE METABOLIC PANEL - Abnormal; Notable for the following:       Result Value   Sodium 130 (*)    Potassium 2.9 (*)    Chloride 92 (*)    Glucose, Bld 430 (*)    Calcium 8.7 (*)    ALT 13 (*)    All other components within normal limits  CBC WITH DIFFERENTIAL/PLATELET    EKG  EKG Interpretation  Date/Time:  Saturday August 15 2016 19:06:29 EST Ventricular Rate:  119 PR Interval:  170 QRS Duration: 82 QT Interval:  286 QTC Calculation: 402 R Axis:   12 Text Interpretation:  Sinus tachycardia Possible Left atrial enlargement Inferior infarct , age undetermined Anterior infarct , age undetermined STD and TWI in lateral leads Abnormal ECG  Confirmed by Glynn Octave [phone]) on 08/15/2016 11:11:06 PM       Radiology Dg Chest 2 View  Result Date: 08/15/2016 CLINICAL DATA:  Chronic productive cough EXAM: CHEST  2 VIEW COMPARISON:  Radiograph 03/27/2014 FINDINGS: Normal mediastinum and cardiac silhouette. Chronic central bronchitic markings. Mild nodularity of the lung bases. Normal pulmonary vasculature. No effusion, infiltrate, or pneumothorax. IMPRESSION: Increased mild nodularity at the lung bases suggest bronchitis or respiratory bronchiolitis. Electronically Signed   By: Suzy Bouchard M.D.   On: 08/15/2016 21:00    Procedures Procedures (including critical care time)  Medications Ordered in ED Medications  albuterol (PROVENTIL,VENTOLIN) solution continuous neb (not administered)  ipratropium (ATROVENT) nebulizer solution 1 mg (not administered)  methylPREDNISolone sodium succinate (SOLU-MEDROL) 125 mg/2 mL injection 125 mg (not administered)  magnesium sulfate IVPB 2 g 50 mL (not administered)  potassium chloride SA (K-DUR,KLOR-CON) CR tablet 60 mEq (not administered)  albuterol (PROVENTIL HFA;VENTOLIN HFA) 108 (90 Base) MCG/ACT inhaler 2 puff (not administered)  azithromycin (ZITHROMAX) tablet 500 mg  (not administered)  albuterol (PROVENTIL) (2.5 MG/3ML) 0.083% nebulizer solution 5 mg (5 mg Nebulization Given 08/15/16 1913)     Initial Impression / Assessment and Plan / ED Course  I have reviewed the triage vital signs and the nursing notes.  Pertinent labs & imaging results that were available during my care of the patient were reviewed by me and considered in my medical decision making (see chart for details).   Patient presents to the ED for worsening SOB, likely COPD exacerbation.  CXR reveals bronchitis.  She continues to wheeze.  Will give albuterol/ipratropium, magnesium and solumedrol. Potassium was replaced and will also add azithro for COPD exacerbation. There is no hypoxia.  Will reefvaluate after treatment.     1:20 AM Upon repeat evaluation, patient feels much better. She still has mild wheezing, but now expiratory only.  Will Dc home with albuterol inhaler, Rx for neb, prednisone, and azithromycin. PCP fu advised within 3 days.  She is tachycardic from nebs but no hypoxic.  Patient safe for DC.   I personally performed the services described in this documentation, which was scribed in my presence. The recorded information has been reviewed and is accurate.     Final Clinical Impressions(s) / ED Diagnoses   Final diagnoses:  None    New Prescriptions New Prescriptions   No medications on file     Everlene Balls, MD 08/16/16 0121

## 2016-08-15 NOTE — ED Triage Notes (Signed)
Patient reports chronic productive cough with SOB , wheezing and chest congestion for several weeks , she ran out of her inhaler , denies fever or chills .

## 2016-08-16 MED ORDER — ALBUTEROL SULFATE (2.5 MG/3ML) 0.083% IN NEBU: 2.5000 mg | INHALATION_SOLUTION | Freq: Four times a day (QID) | RESPIRATORY_TRACT | 0 refills | Status: DC | PRN

## 2016-08-16 MED ORDER — PREDNISONE 20 MG PO TABS: 60.0000 mg | ORAL_TABLET | Freq: Every day | ORAL | 0 refills | Status: DC

## 2016-08-16 MED ORDER — AZITHROMYCIN 250 MG PO TABS: 250.0000 mg | ORAL_TABLET | Freq: Every day | ORAL | 0 refills | Status: DC

## 2016-08-16 NOTE — ED Notes (Signed)
Pt ambulated to BR

## 2016-11-20 ENCOUNTER — Encounter: Payer: Self-pay | Admitting: Family Medicine

## 2017-02-12 ENCOUNTER — Encounter: Payer: Self-pay | Admitting: Family Medicine

## 2017-03-24 ENCOUNTER — Encounter: Payer: Self-pay | Admitting: Family Medicine

## 2017-10-18 ENCOUNTER — Encounter: Payer: Self-pay | Admitting: Family Medicine

## 2017-10-18 ENCOUNTER — Telehealth: Payer: Self-pay | Admitting: *Deleted

## 2017-10-18 ENCOUNTER — Other Ambulatory Visit: Payer: Self-pay

## 2017-10-18 ENCOUNTER — Ambulatory Visit (INDEPENDENT_AMBULATORY_CARE_PROVIDER_SITE_OTHER): Payer: BLUE CROSS/BLUE SHIELD | Admitting: Family Medicine

## 2017-10-18 ENCOUNTER — Encounter: Payer: Self-pay | Admitting: *Deleted

## 2017-10-18 VITALS — BP 138/72 | HR 80 | Temp 97.9°F | Resp 14 | Ht 62.0 in | Wt 155.0 lb

## 2017-10-18 DIAGNOSIS — I10 Essential (primary) hypertension: Secondary | ICD-10-CM | POA: Diagnosis not present

## 2017-10-18 DIAGNOSIS — E782 Mixed hyperlipidemia: Secondary | ICD-10-CM

## 2017-10-18 DIAGNOSIS — J410 Simple chronic bronchitis: Secondary | ICD-10-CM | POA: Diagnosis not present

## 2017-10-18 DIAGNOSIS — E1165 Type 2 diabetes mellitus with hyperglycemia: Secondary | ICD-10-CM

## 2017-10-18 DIAGNOSIS — R2681 Unsteadiness on feet: Secondary | ICD-10-CM | POA: Diagnosis not present

## 2017-10-18 DIAGNOSIS — I679 Cerebrovascular disease, unspecified: Secondary | ICD-10-CM | POA: Insufficient documentation

## 2017-10-18 DIAGNOSIS — R531 Weakness: Secondary | ICD-10-CM

## 2017-10-18 DIAGNOSIS — I6389 Other cerebral infarction: Secondary | ICD-10-CM | POA: Diagnosis not present

## 2017-10-18 MED ORDER — LISINOPRIL 20 MG PO TABS: 20.0000 mg | ORAL_TABLET | Freq: Every day | ORAL | 1 refills | Status: DC

## 2017-10-18 MED ORDER — LANCET DEVICES MISC: 11 refills | Status: DC

## 2017-10-18 MED ORDER — POTASSIUM CHLORIDE CRYS ER 20 MEQ PO TBCR: 20.0000 meq | EXTENDED_RELEASE_TABLET | Freq: Every day | ORAL | 1 refills | Status: DC

## 2017-10-18 MED ORDER — METFORMIN HCL 500 MG PO TABS: ORAL_TABLET | ORAL | 1 refills | Status: DC

## 2017-10-18 MED ORDER — UMECLIDINIUM BROMIDE 62.5 MCG/INH IN AEPB: 1.0000 | INHALATION_SPRAY | Freq: Every day | RESPIRATORY_TRACT | 6 refills | Status: DC

## 2017-10-18 MED ORDER — BLOOD GLUCOSE TEST VI STRP: ORAL_STRIP | 11 refills | Status: DC

## 2017-10-18 MED ORDER — ROSUVASTATIN CALCIUM 10 MG PO TABS
10.0000 mg | ORAL_TABLET | Freq: Every day | ORAL | 1 refills | Status: DC
Start: 2017-10-18 — End: 2017-12-30

## 2017-10-18 MED ORDER — ALBUTEROL SULFATE HFA 108 (90 BASE) MCG/ACT IN AERS
2.0000 | INHALATION_SPRAY | Freq: Four times a day (QID) | RESPIRATORY_TRACT | 1 refills | Status: DC | PRN
Start: 2017-10-18 — End: 2019-01-09

## 2017-10-18 MED ORDER — HYDROCHLOROTHIAZIDE 25 MG PO TABS: 25.0000 mg | ORAL_TABLET | Freq: Every day | ORAL | 1 refills | Status: DC

## 2017-10-18 NOTE — Assessment & Plan Note (Addendum)
Try to obtain records ASAP , start ASA 325mg  for now, will likley transition to Plavix, unless something contraindicated in records  She has resumed her BP medications Restart statin drug as well  Referral to neurology and Physical therapy to help with strengthning

## 2017-10-18 NOTE — Assessment & Plan Note (Signed)
Discussed use of incruse daily

## 2017-10-18 NOTE — Assessment & Plan Note (Signed)
BP improved compared to what patient describes in ER systolic was 211'H, again compliance is the issue

## 2017-10-18 NOTE — Progress Notes (Signed)
Subjective:    Patient ID: Kelly Gibson, female    DOB: 08/17/1960, 57 y.o.   MRN: [id number]  Patient presents for Hospital F/U ([facility] in [facility]- CVA- was in hospital 3/9/219) Pt here for hospital follow up, last seen in Dec 2017  Has uncontrolled DM, COPD, HTN, Hyperlipidemia, multiple risk factors for stroke Was in [place] on vacation, the week before was numb all over right side and didn't go to ER, then while in [facility] 3/[place]  was numb all down right side so she went to ER. She was admitted to hospital for 24 hours. But left AMA.  She MRI on brain, 2D Echo, carotid US, seen by neurology.  - Blood sugar was 400 in the hospital  - she still has right sided weakness, numbness in her face, her vision is also decreased since stroke - she is able to feed and dress herself    She restarted, glyburide, metformin  Insulin 15 units  HTN- restarted HCTZ, lisnopril   COPD- has only used as needed Incruse, does not have albuterol Has not been on ASA   Review Of Systems:  GEN- denies fatigue, fever, weight loss,weakness, recent illness HEENT- denies eye drainage, +change in vision, nasal discharge, CVS- denies chest pain, palpitations RESP- denies SOB, cough, wheeze ABD- denies N/V, change in stools, abd pain GU- denies dysuria, hematuria, dribbling, incontinence MSK- denies joint pain, muscle aches, injury Neuro- denies headache, dizziness, syncope, seizure activity       Objective:    BP 138/72   Pulse 80   Temp 97.9 F (36.6 C) (Oral)   Resp 14   Ht 5\' 2"  (1.575 m)   Wt 155 lb (70.3 kg)   LMP 12/10/2012   SpO2 98%   BMI 28.35 kg/m  GEN- NAD, alert and oriented x3 HEENT- PERRL, EOMI, non injected sclera, pink conjunctiva, MMM, oropharynx clear Neck- Supple, no thyromegaly CVS- RRR, no murmur RESP-CTAB ABD-NABS,soft,NT,ND Neuro- CNII-XII in tact, decreased sensation to touch right side of face compared to left, strength decreased RUE compared to left,  normal tone, gait a little unsteady, neg rhomberg EXT- No edema Pulses- Radial  2+        Assessment & Plan:      Problem List Items Addressed This Visit      Unprioritized   Hyperlipidemia   Relevant Medications   hydrochlorothiazide (HYDRODIURIL) 25 MG tablet   lisinopril (PRINIVIL,ZESTRIL) 20 MG tablet   rosuvastatin (CRESTOR) 10 MG tablet   Essential hypertension, benign    BP improved compared to what patient describes in ER systolic was 466'Z, again compliance is the issue      Relevant Medications   hydrochlorothiazide (HYDRODIURIL) 25 MG tablet   lisinopril (PRINIVIL,ZESTRIL) 20 MG tablet   rosuvastatin (CRESTOR) 10 MG tablet   Diabetes mellitus type II, uncontrolled (HCC)    Discussed the importance of compliance with medications and following up in the office She is going to continue her insulin, she will call back with which brand she has, given copay card for both lantus and tujeo, continue 15 units Continue oral medications Recheck renal function ensure safe to stay on metformin       Relevant Medications   lisinopril (PRINIVIL,ZESTRIL) 20 MG tablet   metFORMIN (GLUCOPHAGE) 500 MG tablet   rosuvastatin (CRESTOR) 10 MG tablet   Other Relevant Orders   Hemoglobin A1c   CVA (cerebral vascular accident) ([place]) - Primary    Try to obtain records ASAP , start  ASA 325mg  for now, will likley transition to Plavix, unless something contraindicated in records  She has resumed her BP medications Restart statin drug as well  Referral to neurology and Physical therapy to help with strengthning       Relevant Medications   hydrochlorothiazide (HYDRODIURIL) 25 MG tablet   lisinopril (PRINIVIL,ZESTRIL) 20 MG tablet   rosuvastatin (CRESTOR) 10 MG tablet   Other Relevant Orders   CBC with Differential/Platelet   Comprehensive metabolic panel   COPD (chronic obstructive pulmonary disease) (HCC)    Discussed use of incruse daily      Relevant Medications    umeclidinium bromide (INCRUSE ELLIPTA) 62.5 MCG/INH AEPB   albuterol (PROVENTIL HFA;VENTOLIN HFA) 108 (90 Base) MCG/ACT inhaler      Note: This dictation was prepared with Dragon dictation along with smaller phrase technology. Any transcriptional errors that result from this process are unintentional.

## 2017-10-18 NOTE — Telephone Encounter (Signed)
noted 

## 2017-10-18 NOTE — Assessment & Plan Note (Signed)
Discussed the importance of compliance with medications and following up in the office She is going to continue her insulin, she will call back with which brand she has, given copay card for both lantus and tujeo, continue 15 units Continue oral medications Recheck renal function ensure safe to stay on metformin

## 2017-10-18 NOTE — Assessment & Plan Note (Signed)
>>  ASSESSMENT AND PLAN FOR ESSENTIAL HYPERTENSION, BENIGN WRITTEN ON 10/18/2017  2:06 PM BY [place], KAWANTA F  BP improved compared to what patient describes in ER systolic was 200's, again compliance is the issue

## 2017-10-18 NOTE — Patient Instructions (Addendum)
Lancets /Test strips Take full dose 325mg   I will check your kidney function Restart the daily the incruse  Check your blood sugar three times a day  Referral to physical therapy Referral to neurologist  F/U 4 weeks

## 2017-10-18 NOTE — Telephone Encounter (Signed)
Received call from patient.   Reports that she is using Toujeo at home.   MD to be made aware.

## 2017-10-18 NOTE — Assessment & Plan Note (Signed)
>>  ASSESSMENT AND PLAN FOR TYPE 2 DIABETES MELLITUS WITH DIABETIC NEUROPATHY, WITH LONG-TERM CURRENT USE OF INSULIN  (HCC) WRITTEN ON 10/18/2017  2:05 PM BY BARI REA F  Discussed the importance of compliance with medications and following up in the office She is going to continue her insulin , she will call back with which brand she has, given copay card for both lantus  and tujeo, continue 15 units Continue oral medications Recheck renal function ensure safe to stay on metformin

## 2017-10-19 LAB — CBC WITH DIFFERENTIAL/PLATELET
BASOS PCT: 0.7 %
Basophils Absolute: 39 cells/uL (ref 0–200)
EOS PCT: 0.7 %
Eosinophils Absolute: 39 cells/uL (ref 15–500)
HCT: 35.5 % (ref 35.0–45.0)
Hemoglobin: 12.2 g/dL (ref 11.7–15.5)
Lymphs Abs: 1232 cells/uL (ref 850–3900)
MCH: 27.9 pg (ref 27.0–33.0)
MCHC: 34.4 g/dL (ref 32.0–36.0)
MCV: 81.2 fL (ref 80.0–100.0)
MONOS PCT: 11.2 %
MPV: 11.5 fL (ref 7.5–12.5)
Neutro Abs: 3662 cells/uL (ref 1500–7800)
Neutrophils Relative %: 65.4 %
PLATELETS: 423 10*3/uL — AB (ref 140–400)
RBC: 4.37 10*6/uL (ref 3.80–5.10)
RDW: 12.6 % (ref 11.0–15.0)
TOTAL LYMPHOCYTE: 22 %
WBC mixed population: 627 cells/uL (ref 200–950)
WBC: 5.6 10*3/uL (ref 3.8–10.8)

## 2017-10-19 LAB — COMPREHENSIVE METABOLIC PANEL
AG Ratio: 1.1 (calc) (ref 1.0–2.5)
ALKALINE PHOSPHATASE (APISO): 75 U/L (ref 33–130)
ALT: 6 U/L (ref 6–29)
AST: 13 U/L (ref 10–35)
Albumin: 3.7 g/dL (ref 3.6–5.1)
BUN/Creatinine Ratio: 15 (calc) (ref 6–22)
BUN: 26 mg/dL — AB (ref 7–25)
CO2: 25 mmol/L (ref 20–32)
CREATININE: 1.79 mg/dL — AB (ref 0.50–1.05)
Calcium: 9.5 mg/dL (ref 8.6–10.4)
Chloride: 100 mmol/L (ref 98–110)
Globulin: 3.4 g/dL (calc) (ref 1.9–3.7)
Glucose, Bld: 159 mg/dL — ABNORMAL HIGH (ref 65–99)
POTASSIUM: 4.7 mmol/L (ref 3.5–5.3)
Sodium: 141 mmol/L (ref 135–146)
TOTAL PROTEIN: 7.1 g/dL (ref 6.1–8.1)
Total Bilirubin: 0.4 mg/dL (ref 0.2–1.2)

## 2017-10-19 LAB — HEMOGLOBIN A1C
EAG (MMOL/L): 16.8 (calc)
HEMOGLOBIN A1C: 12.2 %{Hb} — AB (ref <5.7)
Mean Plasma Glucose: 303 (calc)

## 2017-10-19 LAB — SPECIMEN COMPROMISED

## 2017-10-21 ENCOUNTER — Other Ambulatory Visit: Payer: Self-pay | Admitting: *Deleted

## 2017-10-21 MED ORDER — INSULIN GLARGINE 300 UNIT/ML ~~LOC~~ SOPN: 20.0000 [IU] | PEN_INJECTOR | Freq: Every day | SUBCUTANEOUS | 6 refills | Status: DC

## 2017-10-22 NOTE — Addendum Note (Signed)
Addended by: Vic Blackbird F on: 10/22/2017 08:01 AM   Modules accepted: Orders

## 2017-10-26 ENCOUNTER — Emergency Department (HOSPITAL_COMMUNITY): Payer: BLUE CROSS/BLUE SHIELD

## 2017-10-26 ENCOUNTER — Observation Stay (HOSPITAL_COMMUNITY)
Admission: EM | Admit: 2017-10-26 | Discharge: 2017-10-27 | Disposition: A | Discharge status: Home / Self Care | Payer: BLUE CROSS/BLUE SHIELD | Attending: Family Medicine | Admitting: Family Medicine

## 2017-10-26 ENCOUNTER — Encounter (HOSPITAL_COMMUNITY): Payer: Self-pay | Admitting: Emergency Medicine

## 2017-10-26 ENCOUNTER — Other Ambulatory Visit: Payer: Self-pay

## 2017-10-26 DIAGNOSIS — R42 Dizziness and giddiness: Secondary | ICD-10-CM | POA: Insufficient documentation

## 2017-10-26 DIAGNOSIS — Z7984 Long term (current) use of oral hypoglycemic drugs: Secondary | ICD-10-CM | POA: Insufficient documentation

## 2017-10-26 DIAGNOSIS — R001 Bradycardia, unspecified: Secondary | ICD-10-CM

## 2017-10-26 DIAGNOSIS — I679 Cerebrovascular disease, unspecified: Secondary | ICD-10-CM | POA: Diagnosis not present

## 2017-10-26 DIAGNOSIS — F17200 Nicotine dependence, unspecified, uncomplicated: Secondary | ICD-10-CM | POA: Insufficient documentation

## 2017-10-26 DIAGNOSIS — R112 Nausea with vomiting, unspecified: Secondary | ICD-10-CM | POA: Diagnosis not present

## 2017-10-26 DIAGNOSIS — R269 Unspecified abnormalities of gait and mobility: Secondary | ICD-10-CM | POA: Insufficient documentation

## 2017-10-26 DIAGNOSIS — I10 Essential (primary) hypertension: Secondary | ICD-10-CM

## 2017-10-26 DIAGNOSIS — E119 Type 2 diabetes mellitus without complications: Secondary | ICD-10-CM | POA: Diagnosis not present

## 2017-10-26 DIAGNOSIS — A5903 Trichomonal cystitis and urethritis: Secondary | ICD-10-CM | POA: Insufficient documentation

## 2017-10-26 DIAGNOSIS — R531 Weakness: Secondary | ICD-10-CM | POA: Diagnosis not present

## 2017-10-26 DIAGNOSIS — I152 Hypertension secondary to endocrine disorders: Secondary | ICD-10-CM

## 2017-10-26 DIAGNOSIS — J449 Chronic obstructive pulmonary disease, unspecified: Secondary | ICD-10-CM | POA: Insufficient documentation

## 2017-10-26 DIAGNOSIS — E785 Hyperlipidemia, unspecified: Secondary | ICD-10-CM | POA: Insufficient documentation

## 2017-10-26 DIAGNOSIS — Z8673 Personal history of transient ischemic attack (TIA), and cerebral infarction without residual deficits: Secondary | ICD-10-CM | POA: Diagnosis not present

## 2017-10-26 DIAGNOSIS — I129 Hypertensive chronic kidney disease with stage 1 through stage 4 chronic kidney disease, or unspecified chronic kidney disease: Secondary | ICD-10-CM

## 2017-10-26 LAB — CBC
HCT: 34.7 % — ABNORMAL LOW (ref 36.0–46.0)
HEMOGLOBIN: 11.2 g/dL — AB (ref 12.0–15.0)
MCH: 27.4 pg (ref 26.0–34.0)
MCHC: 32.3 g/dL (ref 30.0–36.0)
MCV: 84.8 fL (ref 78.0–100.0)
PLATELETS: 482 10*3/uL — AB (ref 150–400)
RBC: 4.09 MIL/uL (ref 3.87–5.11)
RDW: 12.4 % (ref 11.5–15.5)
WBC: 6 10*3/uL (ref 4.0–10.5)

## 2017-10-26 LAB — I-STAT BETA HCG BLOOD, ED (MC, WL, AP ONLY)

## 2017-10-26 LAB — DIFFERENTIAL
BASOS ABS: 0 10*3/uL (ref 0.0–0.1)
Basophils Relative: 1 %
EOS ABS: 0.1 10*3/uL (ref 0.0–0.7)
Eosinophils Relative: 1 %
LYMPHS ABS: 1.2 10*3/uL (ref 0.7–4.0)
LYMPHS PCT: 20 %
Monocytes Absolute: 0.6 10*3/uL (ref 0.1–1.0)
Monocytes Relative: 9 %
NEUTROS PCT: 69 %
Neutro Abs: 4.1 10*3/uL (ref 1.7–7.7)

## 2017-10-26 LAB — COMPREHENSIVE METABOLIC PANEL
ALBUMIN: 3 g/dL — AB (ref 3.5–5.0)
ALK PHOS: 82 U/L (ref 38–126)
ALT: 10 U/L — ABNORMAL LOW (ref 14–54)
ANION GAP: 13 (ref 5–15)
AST: 16 U/L (ref 15–41)
BUN: 17 mg/dL (ref 6–20)
CO2: 26 mmol/L (ref 22–32)
Calcium: 9.1 mg/dL (ref 8.9–10.3)
Chloride: 98 mmol/L — ABNORMAL LOW (ref 101–111)
Creatinine, Ser: 0.99 mg/dL (ref 0.44–1.00)
GFR calc Af Amer: >60 mL/min (ref >60)
GFR calc non Af Amer: >60 mL/min (ref >60)
GLUCOSE: 121 mg/dL — AB (ref 65–99)
POTASSIUM: 3.3 mmol/L — AB (ref 3.5–5.1)
SODIUM: 137 mmol/L (ref 135–145)
Total Bilirubin: 0.6 mg/dL (ref 0.3–1.2)
Total Protein: 7.3 g/dL (ref 6.5–8.1)

## 2017-10-26 LAB — I-STAT TROPONIN, ED: Troponin i, poc: 0 ng/mL (ref 0.00–0.08)

## 2017-10-26 LAB — PROTIME-INR
INR: 1.11
PROTHROMBIN TIME: 14.2 s (ref 11.4–15.2)

## 2017-10-26 LAB — GLUCOSE, CAPILLARY: GLUCOSE-CAPILLARY: 183 mg/dL — AB (ref 65–99)

## 2017-10-26 LAB — CBG MONITORING, ED: Glucose-Capillary: 77 mg/dL (ref 65–99)

## 2017-10-26 LAB — TROPONIN I: Troponin I: <0.03 ng/mL (ref <0.03)

## 2017-10-26 LAB — APTT: APTT: 35 s (ref 24–36)

## 2017-10-26 MED ORDER — SODIUM CHLORIDE 0.9 % IV SOLN: 100.0000 mL/h | INTRAVENOUS | Status: DC

## 2017-10-26 MED ORDER — LISINOPRIL 20 MG PO TABS
20.0000 mg | ORAL_TABLET | Freq: Every day | ORAL | Status: DC
  Administered 2017-10-27: 20 mg via ORAL
  Filled 2017-10-26: qty 1

## 2017-10-26 MED ORDER — INSULIN GLARGINE 100 UNIT/ML ~~LOC~~ SOLN
10.0000 [IU] | Freq: Every day | SUBCUTANEOUS | Status: DC
  Administered 2017-10-26: 10 [IU] via SUBCUTANEOUS
  Filled 2017-10-26 (×3): qty 0.1

## 2017-10-26 MED ORDER — ONDANSETRON HCL 4 MG/2ML IJ SOLN
4.0000 mg | Freq: Once | INTRAMUSCULAR | Status: AC
  Administered 2017-10-26: 4 mg via INTRAVENOUS
  Filled 2017-10-26: qty 2

## 2017-10-26 MED ORDER — SODIUM CHLORIDE 0.9 % IV BOLUS: 500.0000 mL | Freq: Once | INTRAVENOUS | Status: DC

## 2017-10-26 MED ORDER — ROSUVASTATIN CALCIUM 5 MG PO TABS
10.0000 mg | ORAL_TABLET | Freq: Every day | ORAL | Status: DC
  Administered 2017-10-27: 10 mg via ORAL
  Filled 2017-10-26: qty 2

## 2017-10-26 MED ORDER — POTASSIUM CHLORIDE CRYS ER 20 MEQ PO TBCR
20.0000 meq | EXTENDED_RELEASE_TABLET | Freq: Every day | ORAL | Status: DC
  Administered 2017-10-26: 20 meq via ORAL
  Filled 2017-10-26: qty 1

## 2017-10-26 MED ORDER — ASPIRIN EC 81 MG PO TBEC
81.0000 mg | DELAYED_RELEASE_TABLET | Freq: Every day | ORAL | Status: DC
  Administered 2017-10-27: 81 mg via ORAL
  Filled 2017-10-26: qty 1

## 2017-10-26 MED ORDER — UMECLIDINIUM BROMIDE 62.5 MCG/INH IN AEPB
1.0000 | INHALATION_SPRAY | Freq: Every day | RESPIRATORY_TRACT | Status: DC
  Administered 2017-10-27: 1 via RESPIRATORY_TRACT
  Filled 2017-10-26: qty 7

## 2017-10-26 MED ORDER — INSULIN ASPART 100 UNIT/ML ~~LOC~~ SOLN
0.0000 [IU] | Freq: Three times a day (TID) | SUBCUTANEOUS | Status: DC
  Administered 2017-10-27: 2 [IU] via SUBCUTANEOUS

## 2017-10-26 MED ORDER — HYDROCHLOROTHIAZIDE 25 MG PO TABS: 25.0000 mg | ORAL_TABLET | Freq: Every day | ORAL | Status: DC

## 2017-10-26 NOTE — ED Notes (Signed)
Patient transported to MRI 

## 2017-10-26 NOTE — Consult Note (Addendum)
Requesting Physician: Dr. Vanita Panda    Chief Complaint: Right-sided weakness and decreased sensation very similar to previous stroke.  History obtained from:  Patient   Chart                                                                                                                                           Kelly Gibson is an 57 y.o. female with history of hypertension, hyperlipidemia, diabetes who was recently diagnosed with stroke back in February in [place].  Apparently she had symptoms prior in February but due to to having recurrence of symptoms she went to the emergency room in [place] at [facility] emergency room where an MRI was obtained showing a subacute stroke.  At that time she had a full stroke workup which they will bring to the hospital.  They state they had a full stroke workup and carotid Dopplers, echo were normal however her LDL and A1c were elevated.  Patient was started on aspirin over the past week and has been taking aspirin for 1 week.  Due to having nausea, vomiting, right-sided weakness and some confusion patient was brought to the emergency room.  This started approximately 10 hours ago.  Upon entering the emergency department patient had a evaded blood pressure of 199/75.  Currently she complains of decreased sensation of her right arm but states that since her blood pressure has been reduced her symptoms have been resolving.  Date last known well: Date: 10/25/2017 Time last known well: Unable to determine tPA Given: No: No stroke seen on MRI NIH stroke scale of 1 Modified Rankin: Rankin Score=0   Past Medical History:  Diagnosis Date  . COPD (chronic obstructive pulmonary disease) ([place])   . Depression   . Diabetes mellitus   . Hyperlipidemia   . Hypertension     Past Surgical History:  Procedure Laterality Date  . CESAREAN SECTION     x 3  . TUMOR REMOVAL     left shoulder    Family History  Problem Relation Age of Onset  .  Diabetes Mother   . Heart disease Father   . Cancer Father    Social History:  reports that she has been smoking.  She has never used smokeless tobacco. She reports that she does not drink alcohol or use drugs.  Allergies:  Allergies  Allergen Reactions  . Niacin And Related Rash    Medications:  No current facility-administered medications for this encounter.    Current Outpatient Medications  Medication Sig Dispense Refill  . albuterol (PROVENTIL HFA;VENTOLIN HFA) 108 (90 Base) MCG/ACT inhaler Inhale 2 puffs into the lungs every 6 (six) hours as needed for wheezing or shortness of breath. 1 Inhaler 1  . albuterol (PROVENTIL) (2.5 MG/3ML) 0.083% nebulizer solution Take 3 mLs (2.5 mg total) by nebulization every 6 (six) hours as needed for wheezing or shortness of breath. 30 vial 0  . aspirin EC 81 MG tablet Take 81 mg by mouth daily.    Marland Kitchen glyBURIDE (DIABETA) 2.5 MG tablet One tab daily 30 tablet 6  . hydrochlorothiazide (HYDRODIURIL) 25 MG tablet Take 1 tablet (25 mg total) by mouth daily. 90 tablet 1  . Ibuprofen-diphenhydrAMINE Cit (ADVIL PM PO) Take 1 tablet by mouth as needed (for sleep).    . Insulin Glargine (TOUJEO SOLOSTAR) 300 UNIT/ML SOPN Inject 20 Units into the skin at bedtime. 3 pen 6  . lisinopril (PRINIVIL,ZESTRIL) 20 MG tablet Take 1 tablet (20 mg total) by mouth daily. 90 tablet 1  . potassium chloride SA (K-DUR,KLOR-CON) 20 MEQ tablet Take 1 tablet (20 mEq total) by mouth daily. 90 tablet 1  . rosuvastatin (CRESTOR) 10 MG tablet Take 1 tablet (10 mg total) by mouth daily. 90 tablet 1  . umeclidinium bromide (INCRUSE ELLIPTA) 62.5 MCG/INH AEPB Inhale 1 puff into the lungs daily. 30 each 6  . Glucose Blood (BLOOD GLUCOSE TEST STRIPS) STRP Please dispense per patient and insurance preference. Use as directed to monitor FSBS  3x daily. Dx: E11.65. 100 each  11  . Lancet Devices MISC Please dispense per patient and insurance preference. Use as directed to monitor FSBS  3x daily. Dx: E11.65. 100 each 11     ROS:                                                                                                                                       History obtained from the patient  General ROS: negative for - chills, fatigue, fever, night sweats, weight gain or weight loss Psychological ROS: negative for - , hallucinations, memory difficulties, mood swings or  Ophthalmic ROS: negative for - blurry vision, double vision, eye pain or loss of vision ENT ROS: negative for - epistaxis, nasal discharge, oral lesions, sore throat, tinnitus or vertigo Respiratory ROS: negative for - cough,  shortness of breath or wheezing Cardiovascular ROS: negative for - chest pain, dyspnea on exertion,  Gastrointestinal ROS: negative for - abdominal pain, diarrhea,  nausea/vomiting or stool incontinence Genito-Urinary ROS: negative for - dysuria, hematuria, incontinence or urinary frequency/urgency Musculoskeletal ROS: negative for - joint swelling or muscular weakness Neurological ROS: as noted in HPI   General Examination:  Blood pressure (!) 199/75, pulse (!) 52, temperature 97.9 F (36.6 C), temperature source Oral, resp. rate 17, last menstrual period 12/10/2012, SpO2 97 %.  HEENT-  Normocephalic, no lesions, without obvious abnormality.  Normal external eye and conjunctiva.   Cardiovascular- S1-S2 audible, pulses palpable throughout   Lungs-no rhonchi or wheezing noted, no excessive working breathing.  Saturations within normal limits Abdomen- All 4 quadrants palpated and nontender Extremities- Warm, dry and intact Musculoskeletal-no joint tenderness, deformity or swelling Skin-warm and dry, no hyperpigmentation, vitiligo, or suspicious lesions  Neurological  Examination Mental Status: Alert, oriented, thought content appropriate.  Speech fluent without evidence of aphasia.  Able to follow 3 step commands without difficulty. Cranial Nerves: II:  Visual fields grossly normal,  III,IV, VI: ptosis not present, extra-ocular motions intact bilaterally, pupils equal, round, reactive to light and accommodation V,VII: smile symmetric, facial light touch sensation normal bilaterally VIII: hearing normal bilaterally IX,X: uvula rises symmetrically XI: bilateral shoulder shrug XII: midline tongue extension Motor: Right : Upper extremity   5/5    Left:     Upper extremity   5/5  Lower extremity   5/5     Lower extremity   5/5 --Right arm drift along with left arm orbiting right arm Tone and bulk:normal tone throughout; no atrophy noted Sensory: Pinprick and light touch decreased on the right side including decreased to vibration splitting midline on forehead nose chin and sternum Deep Tendon Reflexes: 2+ and symmetric throughout Plantars: Right: downgoing   Left: downgoing Cerebellar: normal finger-to-nose on the left with some mild dysmetria secondary to weakness on the right,  and normal heel-to-shin test Gait: Not tested   Lab Results: Basic Metabolic Panel: Recent Labs  Lab 10/26/17 0852  NA 137  K 3.3*  CL 98*  CO2 26  GLUCOSE 121*  BUN 17  CREATININE 0.99  CALCIUM 9.1    CBC: Recent Labs  Lab 10/26/17 0852  WBC 6.0  NEUTROABS 4.1  HGB 11.2*  HCT 34.7*  MCV 84.8  PLT 482*    Lipid Panel: No results for input(s): CHOL, TRIG, HDL, CHOLHDL, VLDL, LDLCALC in the last 168 hours.  CBG: Recent Labs  Lab 10/26/17 1340  GLUCAP 77    Imaging: Ct Head Wo Contrast  Result Date: 10/26/2017 CLINICAL DATA:  57 year old female reports a stroke in February this year with residual right-sided weakness, but increased right side weakness symptoms this morning with nausea vomiting. EXAM: CT HEAD WITHOUT CONTRAST TECHNIQUE: Contiguous  axial images were obtained from the base of the skull through the vertex without intravenous contrast. COMPARISON:  Brain MRI 01/24/2008 and head CT 01/23/2008. FINDINGS: Brain: Ex vacuo enlargement of the lateral ventricles has occurred since 2009, particularly both frontal horns where there is confluent bilateral periventricular white matter hypodensity, perhaps greater on the right. Bilateral basal ganglia and thalamic heterogeneity is compatible with scattered lacunar infarcts, and there is similar heterogeneity in the brainstem. No superimposed cortical encephalomalacia or cortically based acute infarct identified. The cerebellum remains relatively normal. No midline shift, ventriculomegaly, mass effect, evidence of mass lesion, or intracranial hemorrhage. Vascular: Calcified atherosclerosis at the skull base. No suspicious intracranial vascular hyperdensity. Skull: Negative. Sinuses/Orbits: Clear. Other: Visualized orbits and scalp soft tissues are within normal limits. IMPRESSION: 1. Changes of advanced chronic small vessel disease are new since 2009 in the bilateral deep gray matter, brainstem, and anterior periventricular white matter. 2. No intracranial hemorrhage or acute cortically based infarct. Electronically Signed   By: Herminio Heads.D.  On: 10/26/2017 08:44   Mr Brain Wo Contrast (neuro Protocol)  Result Date: 10/26/2017 CLINICAL DATA:  57 y/o F; nausea, vomiting, confusion. Worsening right-sided weakness. History of stroke. EXAM: MRI HEAD WITHOUT CONTRAST TECHNIQUE: Multiplanar, multiecho pulse sequences of the brain and surrounding structures were obtained without intravenous contrast. COMPARISON:  01/24/2008 MRI of the head.  10/26/2017 CT head. FINDINGS: Brain: Motion degraded study. No acute infarction, hemorrhage, hydrocephalus, extra-axial collection or mass lesion. Multiple T2 hyperintense foci are present throughout the basal ganglia as well as the pons compatible with chronic lacunar  infarcts. Patchynonspecific foci of T2 FLAIR hyperintense signal abnormality in subcortical and periventricular white matter are compatible withadvancedchronic microvascular ischemic changes for age. Moderatebrain parenchymal volume loss. There multiple small foci of susceptibility hypointensity within the brainstem, basal ganglia, and left medial temporal lobe compatible hemosiderin deposition of chronic microhemorrhage as well as a small chronic hemorrhagic infarct within the left aspect of corpus callosum body. Vascular: Normal flow voids. Skull and upper cervical spine: Normal marrow signal. Sinuses/Orbits: Negative. Other: None. IMPRESSION: 1. Motion degradation of several sequences. 2. No acute intracranial abnormality identified. 3. Age advanced chronic microvascular ischemic changes and moderate parenchymal volume loss of the brain. Multiple small chronic infarctions within basal ganglia and brainstem. 4. Several foci of chronic microhemorrhage with central distribution, probably related to hypertension. Electronically Signed   By: Kristine Garbe M.D.   On: 10/26/2017 13:41    Assessment and plan discussed with with attending physician and they are in agreement.    Etta Quill PA-C [facility] [phone]  10/26/2017, 3:08 PM   Assessment: 57 y.o. female with likley recrudescence of previous stroke symptoms.  Similar to her previous symptoms in fact that she is presenting with a likely physiological stressor (possible gastroenteritis) would argue for recrudescence as the etiology.  With negative MRI, I would not pursue  Stroke Risk Factors - diabetes mellitus, hyperlipidemia and hypertension  Recommend --Obtain records from emergency department where they did a full stroke workup --Treat nausea and vomiting with anti-emetics --Continue ASA for secondary stroke prevention  Roland Rack, MD [facility] [phone]  If 7pm- 7am, please page  neurology on call as listed in [place].

## 2017-10-26 NOTE — ED Notes (Signed)
Got patient undress on the monitor patient is resting with family at bedside

## 2017-10-26 NOTE — ED Triage Notes (Signed)
Patient brought in by [facility] for complaint of nausea and vomiting that started last night and worsened right sided weakness that started this morning, baseline of right sided weakness after a stroke in February 2019. Patient alert and oriented, denies pain. No other apparent acute neurological changes. Patient in no apparent distress at this time.

## 2017-10-26 NOTE — ED Provider Notes (Signed)
[facility] EMERGENCY DEPARTMENT Provider Note   CSN: [id number] Arrival date & time: 10/26/17  7353     History   Chief Complaint Chief Complaint  Patient presents with  . Weakness    HPI Kelly Gibson is a 57 y.o. female.  HPI Patient presents with concern of nausea, vomiting, right-sided weakness, confusion. Patient notes that she was in her usual state of health last night going to bed, about 10 hours ago, but awoke this morning with the aforementioned concerns. Since onset the nausea has improved, the confusion is improved, but she continues to complain of right-sided weakness in the upper extremity. She notes that that deficit is similar to that which she experienced during a stroke 1 month ago. She notes that since the stroke she had been improving, until this morning. She states that he had been taking medication as directed, though she is unsure of what her medication is.  The patient is accompanied by multiple family members who witnessed the patient this morning, note that she is different from normal.  After the initial evaluation I reviewed the patient's chart, with notable outpatient visit 1 week ago, HPI:  "Patient presents for Hospital F/U [facility] in [facility]- CVA- was in hospital 3/9/219) Pt here for hospital follow up, last seen in Dec 2017  Has uncontrolled DM, COPD, HTN, Hyperlipidemia, multiple risk factors for stroke Was in [place] on vacation, the week before was numb all over right side and didn't go to ER, then while in [facility] 3/13  was numb all down right side so she went to ER. She was admitted to hospital for 24 hours. But left AMA.  She MRI on brain, 2D Echo, carotid US, seen by neurology.  - Blood sugar was 400 in the hospital  - she still has right sided weakness, numbness in her face, her vision is also decreased since stroke - she is able to feed and dress herself"     Past Medical History:  Diagnosis Date  .  COPD (chronic obstructive pulmonary disease) ([place])   . Depression   . Diabetes mellitus   . Hyperlipidemia   . Hypertension     Patient Active Problem List   Diagnosis Date Noted  . CVA (cerebral vascular accident) ([place]) 10/18/2017  . COPD (chronic obstructive pulmonary disease) ([place]) 02/18/2015  . Loss of weight 03/15/2014  . Non compliance w medication regimen 06/17/2013  . Post-menopause bleeding 06/16/2013  . Bronchitis 05/04/2012  . Tobacco user 05/04/2012  . Ganglion cyst 03/09/2012  . Anxiety 03/09/2012  . Essential hypertension, benign 12/17/2011  . Obesity, unspecified 12/17/2011  . Hyperlipidemia 12/17/2011  . Depression 12/17/2011  . Diabetes mellitus type II, uncontrolled ([place]) 12/14/2011    Past Surgical History:  Procedure Laterality Date  . CESAREAN SECTION     x 3  . TUMOR REMOVAL     left shoulder     OB History   None      Home Medications    Prior to Admission medications   Medication Sig Start Date End Date Taking? Authorizing Provider  albuterol (PROVENTIL HFA;VENTOLIN HFA) 108 (90 Base) MCG/ACT inhaler Inhale 2 puffs into the lungs every 6 (six) hours as needed for wheezing or shortness of breath. 10/18/17   [place], Modena Nunnery, MD  albuterol (PROVENTIL) (2.5 MG/3ML) 0.083% nebulizer solution Take 3 mLs (2.5 mg total) by nebulization every 6 (six) hours as needed for wheezing or shortness of breath. 08/16/16   Everlene Balls, MD  Glucose Blood (BLOOD GLUCOSE TEST STRIPS) STRP Please dispense per patient and insurance preference. Use as directed to monitor FSBS  3x daily. Dx: E11.65. 10/18/17   Alycia Rossetti, MD  glyBURIDE (DIABETA) 2.5 MG tablet One tab daily 05/15/16   [place], Modena Nunnery, MD  hydrochlorothiazide (HYDRODIURIL) 25 MG tablet Take 1 tablet (25 mg total) by mouth daily. 10/18/17   [place], Modena Nunnery, MD  Insulin Glargine (TOUJEO SOLOSTAR) 300 UNIT/ML SOPN Inject 20 Units into the skin at bedtime. 10/21/17   Alycia Rossetti, MD  Lancet  Devices MISC Please dispense per patient and insurance preference. Use as directed to monitor FSBS  3x daily. Dx: E11.65. 10/18/17   Alycia Rossetti, MD  lisinopril (PRINIVIL,ZESTRIL) 20 MG tablet Take 1 tablet (20 mg total) by mouth daily. 10/18/17   [place], Modena Nunnery, MD  potassium chloride SA (K-DUR,KLOR-CON) 20 MEQ tablet Take 1 tablet (20 mEq total) by mouth daily. 10/18/17   Alycia Rossetti, MD  rosuvastatin (CRESTOR) 10 MG tablet Take 1 tablet (10 mg total) by mouth daily. 10/18/17   [place], Modena Nunnery, MD  umeclidinium bromide (INCRUSE ELLIPTA) 62.5 MCG/INH AEPB Inhale 1 puff into the lungs daily. 10/18/17   Alycia Rossetti, MD    Family History Family History  Problem Relation Age of Onset  . Diabetes Mother   . Heart disease Father   . Cancer Father     Social History Social History   Tobacco Use  . Smoking status: Current Every Day Smoker  . Smokeless tobacco: Never Used  Substance Use Topics  . Alcohol use: No  . Drug use: No     Allergies   Niacin and related   Review of Systems Review of Systems  Constitutional:       Per HPI, otherwise negative  HENT:       Per HPI, otherwise negative  Respiratory:       Per HPI, otherwise negative  Cardiovascular:       Per HPI, otherwise negative  Gastrointestinal: Positive for nausea and vomiting.  Endocrine:       Negative aside from HPI  Genitourinary:       Neg aside from HPI   Musculoskeletal:       Per HPI, otherwise negative  Skin: Negative.   Neurological: Positive for weakness and light-headedness. Negative for syncope.     Physical Exam Updated Vital Signs BP (!) 177/66   Pulse (!) 43   Temp 97.9 F (36.6 C) (Oral)   LMP 12/10/2012   SpO2 100%   Physical Exam  Constitutional: She is oriented to person, place, and time. She appears well-developed and well-nourished. No distress.  HENT:  Head: Normocephalic and atraumatic.  Eyes: Conjunctivae and EOM are normal.  Cardiovascular: Normal rate  and regular rhythm.  Pulmonary/Chest: Effort normal and breath sounds normal. No stridor. No respiratory distress.  Abdominal: She exhibits no distension. There is no tenderness.  Musculoskeletal: She exhibits no edema.  Neurological: She is alert and oriented to person, place, and time. No cranial nerve deficit.  Slightly diminished grip strength right compared to left, otherwise symmetric strength both upper extremities, no facial asymmetry, speech is brief, clear, without facial droop.  Skin: Skin is warm and dry.  Psychiatric: She has a normal mood and affect.  Nursing note and vitals reviewed.    ED Treatments / Results  Labs (all labs ordered are listed, but only abnormal results are displayed) Labs Reviewed  CBC - Abnormal; Notable  for the following components:      Result Value   Hemoglobin 11.2 (*)    HCT 34.7 (*)    Platelets 482 (*)    All other components within normal limits  COMPREHENSIVE METABOLIC PANEL - Abnormal; Notable for the following components:   Potassium 3.3 (*)    Chloride 98 (*)    Glucose, Bld 121 (*)    Albumin 3.0 (*)    ALT 10 (*)    All other components within normal limits  PROTIME-INR  APTT  DIFFERENTIAL  RAPID URINE DRUG SCREEN, HOSP PERFORMED  URINALYSIS, ROUTINE W REFLEX MICROSCOPIC  I-STAT TROPONIN, ED  I-STAT BETA HCG BLOOD, ED (MC, WL, AP ONLY)  CBG MONITORING, ED    EKG EKG Interpretation  Date/Time:  Tuesday October 26 2017 08:05:25 EDT Ventricular Rate:  44 PR Interval:  184 QRS Duration: 92 QT Interval:  528 QTC Calculation: 451 R Axis:   12 Text Interpretation:  Sinus bradycardia Anterior injury pattern Abnormal ekg Confirmed by Carmin Muskrat [phone]) on 10/26/2017 8:09:33 AM   Radiology Ct Head Wo Contrast  Result Date: 10/26/2017 CLINICAL DATA:  57 year old female reports a stroke in February this year with residual right-sided weakness, but increased right side weakness symptoms this morning with nausea vomiting. EXAM:  CT HEAD WITHOUT CONTRAST TECHNIQUE: Contiguous axial images were obtained from the base of the skull through the vertex without intravenous contrast. COMPARISON:  Brain MRI 01/24/2008 and head CT 01/23/2008. FINDINGS: Brain: Ex vacuo enlargement of the lateral ventricles has occurred since 2009, particularly both frontal horns where there is confluent bilateral periventricular white matter hypodensity, perhaps greater on the right. Bilateral basal ganglia and thalamic heterogeneity is compatible with scattered lacunar infarcts, and there is similar heterogeneity in the brainstem. No superimposed cortical encephalomalacia or cortically based acute infarct identified. The cerebellum remains relatively normal. No midline shift, ventriculomegaly, mass effect, evidence of mass lesion, or intracranial hemorrhage. Vascular: Calcified atherosclerosis at the skull base. No suspicious intracranial vascular hyperdensity. Skull: Negative. Sinuses/Orbits: Clear. Other: Visualized orbits and scalp soft tissues are within normal limits. IMPRESSION: 1. Changes of advanced chronic small vessel disease are new since 2009 in the bilateral deep gray matter, brainstem, and anterior periventricular white matter. 2. No intracranial hemorrhage or acute cortically based infarct. Electronically Signed   By: Genevie Ann M.D.   On: 10/26/2017 08:44   Mr Brain Wo Contrast (neuro Protocol)  Result Date: 10/26/2017 CLINICAL DATA:  57 y/o F; nausea, vomiting, confusion. Worsening right-sided weakness. History of stroke. EXAM: MRI HEAD WITHOUT CONTRAST TECHNIQUE: Multiplanar, multiecho pulse sequences of the brain and surrounding structures were obtained without intravenous contrast. COMPARISON:  01/24/2008 MRI of the head.  10/26/2017 CT head. FINDINGS: Brain: Motion degraded study. No acute infarction, hemorrhage, hydrocephalus, extra-axial collection or mass lesion. Multiple T2 hyperintense foci are present throughout the basal ganglia as well as  the pons compatible with chronic lacunar infarcts. Patchynonspecific foci of T2 FLAIR hyperintense signal abnormality in subcortical and periventricular white matter are compatible withadvancedchronic microvascular ischemic changes for age. Moderatebrain parenchymal volume loss. There multiple small foci of susceptibility hypointensity within the brainstem, basal ganglia, and left medial temporal lobe compatible hemosiderin deposition of chronic microhemorrhage as well as a small chronic hemorrhagic infarct within the left aspect of corpus callosum body. Vascular: Normal flow voids. Skull and upper cervical spine: Normal marrow signal. Sinuses/Orbits: Negative. Other: None. IMPRESSION: 1. Motion degradation of several sequences. 2. No acute intracranial abnormality identified. 3. Age advanced chronic microvascular  ischemic changes and moderate parenchymal volume loss of the brain. Multiple small chronic infarctions within basal ganglia and brainstem. 4. Several foci of chronic microhemorrhage with central distribution, probably related to hypertension. Electronically Signed   By: Kristine Garbe M.D.   On: 10/26/2017 13:41    Procedures Procedures (including critical care time)  Medications Ordered in ED Medications - No data to display   Initial Impression / Assessment and Plan / ED Course  I have reviewed the triage vital signs and the nursing notes.  Pertinent labs & imaging results that were available during my care of the patient were reviewed by me and considered in my medical decision making (see chart for details).    11:17 AM Patient was able to tolerate oral intake, but on attempt at ambulation was leaning to the right. She notes that this is atypical for her, but similar to what she experienced during prior stroke. We again discussed the patient's medication, and she seemingly was on aspirin, not Plavix, but is unsure.  Though the patient's initial CT scan was reassuring,  given concern for stroke, particularly given the patient's uncertainty regarding ongoing stroke prophylaxis, Plavix, or aspirin, MRI is pending.   Update:, Patient vomiting, with attempt at ambulation.  4:16 PM On return from MRI the patient has persistent gait instability. Discussed the patient's case with our neurology team, hospitalist team As I discussed the MRI with the family at length. Given concern for her evidence of persistent hypertension, as well as her persistent gait instability, the patient will require admission for further evaluation and management.  Final Clinical Impressions(s) / ED Diagnoses  Gait difficulty Nausea and vomiting   Carmin Muskrat, MD 10/26/17 [phone]

## 2017-10-26 NOTE — ED Notes (Signed)
Medical records were requested for pt

## 2017-10-26 NOTE — ED Notes (Signed)
Pt ambulated down the hallway and back to her bed.  Pt required moderate to significant assistance to prevent leaning to the right while ambulating.  Upon returning to the bed, the pt became very nauseous and experienced emesis.  Informed Dr. Vanita Panda.

## 2017-10-26 NOTE — H&P (Signed)
[facility] History and Physical  Kelly Gibson GHW:[id number] DOB: 04/02/1961 DOA: 10/26/2017  Referring physician:  PCP: Alycia Rossetti, MD   Chief Complaint: "I just got weak.  HPI: Kelly Gibson is a 57 y.o. female past medical history significant for COPD, diabetes and hypertension the emergency room with weakness.  Incident started early in the morning.  When she woke up.  Persistent.  No improvement with ambulation.  No exacerbation with movement.  Patient denies any headache.  Fear that she may have had a stroke.  EMS was activated.  Patient was a 1 assist to walk.  Denies any recent illnesses.  Only recent medication change was addition of Tradjenta to diabetes regimen.\  ED Course: Neurology consulted.  MRI negative for stroke that is acute.   Review of Systems:  As per HPI otherwise 10 point review of systems negative.    Past Medical History:  Diagnosis Date  . COPD (chronic obstructive pulmonary disease) ([place])   . Depression   . Diabetes mellitus   . Hyperlipidemia   . Hypertension    Past Surgical History:  Procedure Laterality Date  . CESAREAN SECTION     x 3  . TUMOR REMOVAL     left shoulder   Social History:  reports that she has been smoking.  She has never used smokeless tobacco. She reports that she does not drink alcohol or use drugs.  Allergies  Allergen Reactions  . Niacin And Related Rash    Family History  Problem Relation Age of Onset  . Diabetes Mother   . Heart disease Father   . Cancer Father      Prior to Admission medications   Medication Sig Start Date End Date Taking? Authorizing Provider  albuterol (PROVENTIL HFA;VENTOLIN HFA) 108 (90 Base) MCG/ACT inhaler Inhale 2 puffs into the lungs every 6 (six) hours as needed for wheezing or shortness of breath. 10/18/17  Yes [place], Modena Nunnery, MD  albuterol (PROVENTIL) (2.5 MG/3ML) 0.083% nebulizer solution Take 3 mLs (2.5 mg total) by nebulization every 6 (six) hours as needed  for wheezing or shortness of breath. 08/16/16  Yes Everlene Balls, MD  aspirin EC 81 MG tablet Take 81 mg by mouth daily.   Yes [provider]  glyBURIDE (DIABETA) 2.5 MG tablet One tab daily 05/15/16  Yes [place], Modena Nunnery, MD  hydrochlorothiazide (HYDRODIURIL) 25 MG tablet Take 1 tablet (25 mg total) by mouth daily. 10/18/17  Yes [place], Modena Nunnery, MD  Ibuprofen-diphenhydrAMINE Cit (ADVIL PM PO) Take 1 tablet by mouth as needed (for sleep).   Yes [provider]  Insulin Glargine (TOUJEO SOLOSTAR) 300 UNIT/ML SOPN Inject 20 Units into the skin at bedtime. 10/21/17  Yes [place], Modena Nunnery, MD  lisinopril (PRINIVIL,ZESTRIL) 20 MG tablet Take 1 tablet (20 mg total) by mouth daily. 10/18/17  Yes [place], Modena Nunnery, MD  potassium chloride SA (K-DUR,KLOR-CON) 20 MEQ tablet Take 1 tablet (20 mEq total) by mouth daily. 10/18/17  Yes [place], Modena Nunnery, MD  rosuvastatin (CRESTOR) 10 MG tablet Take 1 tablet (10 mg total) by mouth daily. 10/18/17  Yes [place], Modena Nunnery, MD  umeclidinium bromide (INCRUSE ELLIPTA) 62.5 MCG/INH AEPB Inhale 1 puff into the lungs daily. 10/18/17  Yes [place], Modena Nunnery, MD  Glucose Blood (BLOOD GLUCOSE TEST STRIPS) STRP Please dispense per patient and insurance preference. Use as directed to monitor FSBS  3x daily. Dx: E11.65. 10/18/17   Alycia Rossetti, MD  Lancet Devices MISC Please dispense per patient  and insurance preference. Use as directed to monitor FSBS  3x daily. Dx: E11.65. 10/18/17   Alycia Rossetti, MD   Physical Exam: Vitals:   10/26/17 1515 10/26/17 1530 10/26/17 1545 10/26/17 1800  BP: (!) 156/66 (!) 158/71 (!) 153/52 (!) 144/74  Pulse: (!) 48 (!) 48 (!) 50 (!) 47  Resp: 16 (!) 22 (!) 22   Temp:      TempSrc:      SpO2: 98% 97% 98% 98%    Wt Readings from Last 3 Encounters:  10/18/17 70.3 kg (155 lb)  08/15/16 68 kg (150 lb)  05/15/16 70.3 kg (155 lb)    General:  Appears calm and comfortable; A&Ox3 Eyes:  PERRL, EOMI, normal lids, iris ENT:   grossly normal hearing, lips & tongue Neck:  no LAD, masses or thyromegaly Cardiovascular:  RRR, no m/r/g. No LE edema.  Respiratory:  CTA bilaterally, no w/r/r. Normal respiratory effort. Abdomen:  soft, ntnd Skin:  no rash or induration seen on limited exam Musculoskeletal:  grossly normal tone BUE/BLE Psychiatric:  grossly normal mood and affect, speech fluent and appropriate Neurologic:  CN 2-12 grossly intact, moves all extremities in coordinated fashion.          Labs on Admission:  Basic Metabolic Panel: Recent Labs  Lab 10/26/17 0852  NA 137  K 3.3*  CL 98*  CO2 26  GLUCOSE 121*  BUN 17  CREATININE 0.99  CALCIUM 9.1   Liver Function Tests: Recent Labs  Lab 10/26/17 0852  AST 16  ALT 10*  ALKPHOS 82  BILITOT 0.6  PROT 7.3  ALBUMIN 3.0*   No results for input(s): LIPASE, AMYLASE in the last 168 hours. No results for input(s): AMMONIA in the last 168 hours. CBC: Recent Labs  Lab 10/26/17 0852  WBC 6.0  NEUTROABS 4.1  HGB 11.2*  HCT 34.7*  MCV 84.8  PLT 482*   Cardiac Enzymes: No results for input(s): CKTOTAL, CKMB, CKMBINDEX, TROPONINI in the last 168 hours.  BNP (last 3 results) No results for input(s): BNP in the last 8760 hours.  ProBNP (last 3 results) No results for input(s): PROBNP in the last 8760 hours.   Serum creatinine: 0.99 mg/dL 10/26/17 0852 Estimated creatinine clearance: 59 mL/min  CBG: Recent Labs  Lab 10/26/17 1340  GLUCAP 77    Radiological Exams on Admission: Ct Head Wo Contrast  Result Date: 10/26/2017 CLINICAL DATA:  57 year old female reports a stroke in February this year with residual right-sided weakness, but increased right side weakness symptoms this morning with nausea vomiting. EXAM: CT HEAD WITHOUT CONTRAST TECHNIQUE: Contiguous axial images were obtained from the base of the skull through the vertex without intravenous contrast. COMPARISON:  Brain MRI 01/24/2008 and head CT 01/23/2008. FINDINGS: Brain: Ex  vacuo enlargement of the lateral ventricles has occurred since 2009, particularly both frontal horns where there is confluent bilateral periventricular white matter hypodensity, perhaps greater on the right. Bilateral basal ganglia and thalamic heterogeneity is compatible with scattered lacunar infarcts, and there is similar heterogeneity in the brainstem. No superimposed cortical encephalomalacia or cortically based acute infarct identified. The cerebellum remains relatively normal. No midline shift, ventriculomegaly, mass effect, evidence of mass lesion, or intracranial hemorrhage. Vascular: Calcified atherosclerosis at the skull base. No suspicious intracranial vascular hyperdensity. Skull: Negative. Sinuses/Orbits: Clear. Other: Visualized orbits and scalp soft tissues are within normal limits. IMPRESSION: 1. Changes of advanced chronic small vessel disease are new since 2009 in the bilateral deep gray matter, brainstem, and anterior  periventricular white matter. 2. No intracranial hemorrhage or acute cortically based infarct. Electronically Signed   By: Genevie Ann M.D.   On: 10/26/2017 08:44   Mr Brain Wo Contrast (neuro Protocol)  Result Date: 10/26/2017 CLINICAL DATA:  57 y/o F; nausea, vomiting, confusion. Worsening right-sided weakness. History of stroke. EXAM: MRI HEAD WITHOUT CONTRAST TECHNIQUE: Multiplanar, multiecho pulse sequences of the brain and surrounding structures were obtained without intravenous contrast. COMPARISON:  01/24/2008 MRI of the head.  10/26/2017 CT head. FINDINGS: Brain: Motion degraded study. No acute infarction, hemorrhage, hydrocephalus, extra-axial collection or mass lesion. Multiple T2 hyperintense foci are present throughout the basal ganglia as well as the pons compatible with chronic lacunar infarcts. Patchynonspecific foci of T2 FLAIR hyperintense signal abnormality in subcortical and periventricular white matter are compatible withadvancedchronic microvascular ischemic  changes for age. Moderatebrain parenchymal volume loss. There multiple small foci of susceptibility hypointensity within the brainstem, basal ganglia, and left medial temporal lobe compatible hemosiderin deposition of chronic microhemorrhage as well as a small chronic hemorrhagic infarct within the left aspect of corpus callosum body. Vascular: Normal flow voids. Skull and upper cervical spine: Normal marrow signal. Sinuses/Orbits: Negative. Other: None. IMPRESSION: 1. Motion degradation of several sequences. 2. No acute intracranial abnormality identified. 3. Age advanced chronic microvascular ischemic changes and moderate parenchymal volume loss of the brain. Multiple small chronic infarctions within basal ganglia and brainstem. 4. Several foci of chronic microhemorrhage with central distribution, probably related to hypertension. Electronically Signed   By: Kristine Garbe M.D.   On: 10/26/2017 13:41    EKG: Independently reviewed. Sinus bradycardia.  Assessment/Plan Principal Problem:   Weakness  Weakness 2/2 unk Dehydration vs Braycardia PT/OT IVF Prn nausea Acute stroke w/u by Neuro neg Serial trop ECHO  DM SSI AC Hold diabeta Glargine 20u->10u  Hypertension When necessary hydralazine 10 mg IV as needed for severe blood pressure Cont hctz, lisinopril, kdur  Hyperlipidemia Continue statin  COPD Cont Incruse Ellipta   Code Status: FC  DVT Prophylaxis: lovenox Family Communication: dgtr at bedside Disposition Plan: Pending Improvement  Status: obs tele  Elwin Mocha, MD Family Medicine [facility] [email or web address] Password [email or web address]

## 2017-10-27 ENCOUNTER — Other Ambulatory Visit: Payer: Self-pay | Admitting: Cardiology

## 2017-10-27 ENCOUNTER — Observation Stay (HOSPITAL_BASED_OUTPATIENT_CLINIC_OR_DEPARTMENT_OTHER): Payer: BLUE CROSS/BLUE SHIELD

## 2017-10-27 DIAGNOSIS — I5030 Unspecified diastolic (congestive) heart failure: Secondary | ICD-10-CM | POA: Diagnosis not present

## 2017-10-27 DIAGNOSIS — I679 Cerebrovascular disease, unspecified: Secondary | ICD-10-CM

## 2017-10-27 DIAGNOSIS — R531 Weakness: Secondary | ICD-10-CM | POA: Diagnosis not present

## 2017-10-27 DIAGNOSIS — I10 Essential (primary) hypertension: Secondary | ICD-10-CM

## 2017-10-27 DIAGNOSIS — R001 Bradycardia, unspecified: Secondary | ICD-10-CM

## 2017-10-27 LAB — URINALYSIS, ROUTINE W REFLEX MICROSCOPIC
BILIRUBIN URINE: NEGATIVE
Glucose, UA: NEGATIVE mg/dL
Ketones, ur: NEGATIVE mg/dL
Nitrite: NEGATIVE
Protein, ur: 30 mg/dL — AB
Specific Gravity, Urine: 1.023 (ref 1.005–1.030)
pH: 6 (ref 5.0–8.0)

## 2017-10-27 LAB — RAPID URINE DRUG SCREEN, HOSP PERFORMED
Amphetamines: NOT DETECTED
BENZODIAZEPINES: NOT DETECTED
Barbiturates: NOT DETECTED
COCAINE: NOT DETECTED
Opiates: NOT DETECTED
TETRAHYDROCANNABINOL: NOT DETECTED

## 2017-10-27 LAB — GLUCOSE, CAPILLARY
GLUCOSE-CAPILLARY: 102 mg/dL — AB (ref 65–99)
GLUCOSE-CAPILLARY: 144 mg/dL — AB (ref 65–99)
Glucose-Capillary: 91 mg/dL (ref 65–99)

## 2017-10-27 LAB — ECHOCARDIOGRAM COMPLETE
Height: 62 in
Weight: 2480 oz

## 2017-10-27 MED ORDER — LIVING WELL WITH DIABETES BOOK
Freq: Once | Status: AC
  Administered 2017-10-27: 14:00:00
  Filled 2017-10-27: qty 1

## 2017-10-27 MED ORDER — HYDROCHLOROTHIAZIDE 25 MG PO TABS
50.0000 mg | ORAL_TABLET | Freq: Every day | ORAL | Status: DC
  Administered 2017-10-27: 50 mg via ORAL
  Filled 2017-10-27: qty 2

## 2017-10-27 MED ORDER — LISINOPRIL 20 MG PO TABS: 40.0000 mg | ORAL_TABLET | Freq: Every day | ORAL | Status: DC

## 2017-10-27 MED ORDER — INSULIN GLARGINE 300 UNIT/ML ~~LOC~~ SOPN: 10.0000 [IU] | PEN_INJECTOR | Freq: Every day | SUBCUTANEOUS | 6 refills | Status: DC

## 2017-10-27 MED ORDER — AMLODIPINE BESYLATE 5 MG PO TABS
5.0000 mg | ORAL_TABLET | Freq: Every day | ORAL | Status: DC
  Administered 2017-10-27: 5 mg via ORAL
  Filled 2017-10-27: qty 1

## 2017-10-27 MED ORDER — METRONIDAZOLE 500 MG PO TABS
2000.0000 mg | ORAL_TABLET | Freq: Once | ORAL | Status: AC
Start: 2017-10-27 — End: 2017-10-27
  Administered 2017-10-27: 2000 mg via ORAL
  Filled 2017-10-27: qty 4

## 2017-10-27 MED ORDER — AMLODIPINE BESYLATE 5 MG PO TABS: 5.0000 mg | ORAL_TABLET | Freq: Every day | ORAL | 0 refills | Status: DC

## 2017-10-27 MED ORDER — HYDROCHLOROTHIAZIDE 25 MG PO TABS: 25.0000 mg | ORAL_TABLET | Freq: Every day | ORAL | Status: DC

## 2017-10-27 MED ORDER — LISINOPRIL 40 MG PO TABS: 40.0000 mg | ORAL_TABLET | Freq: Every day | ORAL | 0 refills | Status: DC

## 2017-10-27 NOTE — Progress Notes (Signed)
Inpatient Diabetes Program Recommendations  AACE/ADA: New Consensus Statement on Inpatient Glycemic Control (2015)  Target Ranges:  Prepandial:   less than 140 mg/dL      Peak postprandial:   less than 180 mg/dL (1-2 hours)      Critically ill patients:  140 - 180 mg/dL   Results for Kelly Gibson, Kelly Gibson (MRN [id number]) as of 10/27/2017 12:35  Ref. Range 10/26/2017 13:40 10/26/2017 22:01 10/27/2017 06:52 10/27/2017 11:27  Glucose-Capillary Latest Ref Range: 65 - 99 mg/dL 77 183 (H) 102 (H) 144 (H)  Results for Kelly Gibson, Kelly Gibson (MRN [id number]) as of 10/27/2017 12:35  Ref. Range 10/18/2017 12:32  Hemoglobin A1C Latest Ref Range: <5.7 % of total Hgb 12.2 (H)   Review of Glycemic Control  Diabetes history: DM2 Outpatient Diabetes medications: Toujeo 20 units QHS, Glipizide 2.5 mg QAM Current orders for Inpatient glycemic control: Lantus 10 units QHS, Novolog 0-15 units TID with meals  Inpatient Diabetes Program Recommendations: HgbA1C: A1C 12.2% on 10/18/2017 indicating an average glucose of 303 mg/dl over the past 2-3 months.  NOTE: Spoke with patient and her husband about diabetes and home regimen for diabetes control. Patient reports that she is followed by PCP for diabetes management and currently she takes Toujeo 20 units QHS, Glipizide 2.5 mg QAM as an outpatient for diabetes control. Patient reports that she has been taking DM medications as prescribed. Patient reports that she last seen PCP on 10/18/17 and at that time her liver enzymes were elevated so she was told to stop Metformin and increase Toujeo from 15 to 20 units QHS. Patient admits that prior to February she was NOT taking any of her medications as prescribed.  Discussed A1C results (12.2% on 10/18/2017) and explained that her current A1C indicates an average glucose of 303 mg/dl over the past 2-3 months. Discussed glucose and A1C goals. Patient reports that since she has increased the Toujeo to 20 units QHS and continued Glipizide her glucose has  improved significantly and it is usually less than 150 mg/dl when she checks it.  Discussed importance of checking CBGs and maintaining good CBG control to prevent long-term and short-term complications. Explained how hyperglycemia leads to damage within blood vessels which lead to the common complications seen with uncontrolled diabetes. Stressed to the patient the importance of improving glycemic control to prevent further complications from uncontrolled diabetes.  Encouraged patient to continue taking medications as prescribed, checking glucose 2-3 times per day, and follow up with PCP regarding improve DM control. Patient verbalized understanding of information discussed and she states that she has no further questions at this time related to diabetes. Expect good compliance as patient appears to be motivated to improve overall health.  Thanks, Barnie Alderman, RN, MSN, CDE Diabetes Coordinator Inpatient Diabetes Program [phone] (Team Pager)

## 2017-10-27 NOTE — Evaluation (Signed)
Physical Therapy Evaluation Patient Details Name: Kelly Gibson MRN: [id number] DOB: 07/29/1960 Today's Date: 10/27/2017   History of Present Illness  Patient is a 57 y/o female who presents with right sided weakness/numbness and N/V. Head CT and MRI-unremarkable. NIH:1 PMH includes recent CVA in Feb 2019, HTN, HLD, COPD, depression.   Clinical Impression  Patient presents with right sided weakness/numbness, impaired balance and bradycardia impacting mobility. Pt with hx of stroke in Feb with similar symptoms which pt reports are worsened during evaluation today. Also reports slower processing which husband agrees with. Pt independent PTA and spouse works but will be able to amend schedule to provide assist at home. Tolerated gait training with Min A-Min guard for balance/safety but balance improved with increased distance. HR ranged from 46-70 bpm. Will follow acutely to maximize independence and mobility prior to return home.    Follow Up Recommendations Home health PT;Supervision for mobility/OOB    Equipment Recommendations  None recommended by PT    Recommendations for Other Services       Precautions / Restrictions Precautions Precautions: Fall Precaution Comments: bradycardia Restrictions Weight Bearing Restrictions: No      Mobility  Bed Mobility Overal bed mobility: Modified Independent             General bed mobility comments: No assist needed, HOB slightly elevated.  Transfers Overall transfer level: Needs assistance Equipment used: None Transfers: Sit to/from Stand Sit to Stand: Min guard         General transfer comment: Stood up multiple times from EOB with LOB x2 onto bed. Min guard for safety.   Ambulation/Gait Ambulation/Gait assistance: Min guard;Min assist Ambulation Distance (Feet): 150 Feet Assistive device: None Gait Pattern/deviations: Step-through pattern;Decreased stance time - right;Decreased step length - left;Narrow base of  support Gait velocity: decreased Gait velocity interpretation: Below normal speed for age/gender General Gait Details: Slow, unsteady gait with mild right knee instability which improved with increased distance. HR bradycardic ranging from 46-70 bpm.  Stairs            Wheelchair Mobility    Modified Rankin (Stroke Patients Only) Modified Rankin (Stroke Patients Only) Pre-Morbid Rankin Score: Slight disability Modified Rankin: Moderately severe disability     Balance Overall balance assessment: Needs assistance Sitting-balance support: Feet supported;No upper extremity supported Sitting balance-Leahy Scale: Good Sitting balance - Comments: Able to donn socks sitting EOB without difficulty.    Standing balance support: During functional activity Standing balance-Leahy Scale: Fair Standing balance comment: Able to stand and doff gown with min guard due to LOB onto bed x2.                              Pertinent Vitals/Pain Pain Assessment: No/denies pain    Home Living Family/patient expects to be discharged to:: Private residence Living Arrangements: Spouse/significant other(Spouse can take time off work to assist at home) Available Help at Discharge: Family;Available PRN/intermittently Type of Home: House Home Access: Level entry     Home Layout: Two level;Bed/bath upstairs Home Equipment: None      Prior Function Level of Independence: Independent         Comments: Cooks/cleans. Does not work.     Hand Dominance   Dominant Hand: Right    Extremity/Trunk Assessment   Upper Extremity Assessment Upper Extremity Assessment: Defer to OT evaluation;RUE deficits/detail RUE Deficits / Details: Grossly ~3+/5 throughout with weaker grip but functional - seems to be close to baseline  from prior stroke RUE Sensation: decreased light touch    Lower Extremity Assessment Lower Extremity Assessment: RLE deficits/detail RLE Deficits / Details: Grossly  ~4/5 throughout, seems to be close to baseline from prior stroke. RLE Sensation: decreased light touch       Communication   Communication: Expressive difficulties  Cognition Arousal/Alertness: Awake/alert Behavior During Therapy: WFL for tasks assessed/performed Overall Cognitive Status: Impaired/Different from baseline Area of Impairment: Problem solving                             Problem Solving: Slow processing General Comments: Reports some slower processing and word finding difficulty which husband agrees with. A&Ox4.      General Comments General comments (skin integrity, edema, etc.): Spouse present during session.    Exercises     Assessment/Plan    PT Assessment Patient needs continued PT services  PT Problem List Decreased strength;Decreased mobility;Decreased balance;Impaired sensation;Cardiopulmonary status limiting activity       PT Treatment Interventions Functional mobility training;Balance training;Patient/family education;Gait training;Therapeutic activities;Stair training;Therapeutic exercise;Cognitive remediation;Neuromuscular re-education    PT Goals (Current goals can be found in the Care Plan section)  Acute Rehab PT Goals Patient Stated Goal: to return to PLOF PT Goal Formulation: With patient Time For Goal Achievement: 11/10/17 Potential to Achieve Goals: Good    Frequency Min 3X/week   Barriers to discharge Decreased caregiver support;Inaccessible home environment Spouse works but can try to amend schedule    Co-evaluation               AM-PAC PT "6 Clicks" Daily Activity  Outcome Measure Difficulty turning over in bed (including adjusting bedclothes, sheets and blankets)?: None Difficulty moving from lying on back to sitting on the side of the bed? : None Difficulty sitting down on and standing up from a chair with arms (e.g., wheelchair, bedside commode, etc,.)?: A Little Help needed moving to and from a bed to chair  (including a wheelchair)?: A Little Help needed walking in hospital room?: A Little Help needed climbing 3-5 steps with a railing? : A Little 6 Click Score: 20    End of Session Equipment Utilized During Treatment: Gait belt Activity Tolerance: Patient tolerated treatment well Patient left: in bed;with call bell/phone within reach;with bed alarm set;with family/visitor present Nurse Communication: Mobility status PT Visit Diagnosis: Unsteadiness on feet (R26.81);Muscle weakness (generalized) (M62.81);Difficulty in walking, not elsewhere classified (R26.2);Hemiplegia and hemiparesis Hemiplegia - Right/Left: Right Hemiplegia - dominant/non-dominant: Dominant Hemiplegia - caused by: Unspecified    Time: 9892-1194 PT Time Calculation (min) (ACUTE ONLY): 22 min   Charges:   PT Evaluation $PT Eval Moderate Complexity: 1 Mod     PT G CodesWray Kearns, PT, DPT [phone]    Lacie Draft 10/27/2017, 9:33 AM

## 2017-10-27 NOTE — Progress Notes (Signed)
CSW acknowledging consult for "trouble affording medications". CSW alerted RNCM; RNCM will follow up with medication assistance, if appropriate.  Please consult again if CSW need arises.  Laveda Abbe, [place] Clinical Social Worker [phone]

## 2017-10-27 NOTE — Discharge Summary (Signed)
Physician Discharge Summary  Kelly Gibson HWE:[id number] DOB: 03/05/1961 DOA: 10/26/2017  PCP: Alycia Rossetti, MD  Admit date: 10/26/2017 Discharge date: 10/27/2017  Admitted From: Home Disposition: Home   Recommendations for Outpatient Follow-up:  1. Follow up with PCP in 1-2 weeks 2. Follow up with neurology in 6 weeks  Home Health: None Equipment/Devices: None Discharge Condition: Stable CODE STATUS: Full Diet recommendation: Heart healthy, carb-modified  Brief/Interim Summary: Kelly Gibson is a 57 y.o. female with a history of recent CVA, HTN, IDT2DM, hyperlipidemia, and COPD who presented to the ED with generalized weakness, associated with lightheadedness, nausea and vomiting that began upon waking that morning. She stated these symptoms were similar to those surrounding her recent CVA, though those included tongue tingling and right arm numbness which is not currently present. Neurology was consulted and she was admitted for stroke-like symptoms. MRI was negative for acute stroke and neurology recommended no changes to medications. Since arrival she has been monitored on cardiac telemetry with persistent sinus bradycardia and intermittent junctional bradycardia. Troponin has been negative. Echocardiogram was ordered and cardiology consulted, though no further intervention was felt to be warranted at this time.  She had reported an increase of toujeo recently by PCP due to discontinuation of metformin due to elevated LFTs. Her husband reports that her morning blood sugars have been in the 60's which is lower than her usual and symptoms seemed to improve over the course of the day as she ate more.   Discharge Diagnoses:  Principal Problem:   Weakness Active Problems:   Cerebrovascular disease   Bradycardia   Uncontrolled hypertension  Weakness: Possibly due to mild transient GI illness which would explain nausea and vomiting with resultant mild dehydration. Neurology  speculates this may be a cause of recrudescence of CVA symptoms. Fortunately no evidence of recurrent/extension of stroke on imaging. Also possibly related to relative hypoglycemia (last A1c was 12.2% and insulin has been recently increased with reported CBGs in 60's). Symptomatic bradycardia also a possibility.  - PT recommend home health PT which is arranged but for insurance purposes was not possible.  - Symptoms improved with IV fluids and po intake is improving.  - Decrease insulin and follow up closely with PCP.   Bradycardia: Sinus and junctional rhythms noted. No symptoms, troponin elevation or ST deviation to suggest ACS. No negative chronotropes in meds. Review of available records show that this is a new finding, possibly contributing to symptoms.  - Cardiology consulted, will arrange heart monitor as outpatient and follow up with Dr. Debara Pickett - Echocardiogram showed preserved EF and grade 2 diastolic dysfunction  ELF8BO:  - Recent DC metformin, increased toujeo in addition to ongoing sulfonylurea. Last HbA1c 12.2%, though with her now taking these medications it sounds like her average blood sugar has dropped precipitously. Will decrease long-acting insulin and continue [facility] for now, though would strongly consider discontinuing this with insulin use.  - Needs PCP follow up.  - Diabetes coordinator has provided education and expects good compliance. Has good understanding of hypoglycemia.   HTN: BPs have been consistently elevated and it appears that medications have been titrated as an outpatient since the stroke (paperwork provided by husband states her BP was >200/100). Remained above goal here, so doubt orthostatic hypotension.  - Continue HCTZ, increase lisinopril 20mg  > 40mg , added norvasc  Hyperlipidemia: LDL improving per records.  - Continue statin  COPD:  - Continue home medications. Care manager provided coupons for medications, though pt also has insurance.  Trichomonas  urethritis:  - Given metronidazole 2g po x1, counseled husband to be treated.   Discharge Instructions Discharge Instructions    Diet - low sodium heart healthy   Complete by:  As directed    Discharge instructions   Complete by:  As directed    - Increase lisinopril from 20 mg daily to 40 mg daily (for improved blood pressure control) - Start taking norvasc 5mg  once daily for improved blood pressure control - Decrease toujeo to 10 units nightly.  - Follow up with your PCP in the next week for recheck. - A heart monitor will be arranged with our cardiology clinic. If you don't hear from them this week, call the number provided.  - If your symptoms return, seek medical attention.     Allergies as of 10/27/2017      Reactions   Niacin And Related Rash      Medication List    TAKE these medications   ADVIL PM PO Take 1 tablet by mouth as needed (for sleep).   albuterol (2.5 MG/3ML) 0.083% nebulizer solution Commonly known as:  PROVENTIL Take 3 mLs (2.5 mg total) by nebulization every 6 (six) hours as needed for wheezing or shortness of breath.   albuterol 108 (90 Base) MCG/ACT inhaler Commonly known as:  PROVENTIL HFA;VENTOLIN HFA Inhale 2 puffs into the lungs every 6 (six) hours as needed for wheezing or shortness of breath.   amLODipine 5 MG tablet Commonly known as:  NORVASC Take 1 tablet (5 mg total) by mouth daily.   aspirin EC 81 MG tablet Take 81 mg by mouth daily.   glyBURIDE 2.5 MG tablet Commonly known as:  DIABETA One tab daily   Insulin Glargine 300 UNIT/ML Sopn Commonly known as:  TOUJEO SOLOSTAR Inject 10 Units into the skin at bedtime. What changed:  how much to take   [place] Please dispense per patient and insurance preference. Use as directed to monitor FSBS  3x daily. Dx: E11.65.   lisinopril 40 MG tablet Commonly known as:  PRINIVIL,ZESTRIL Take 1 tablet (40 mg total) by mouth daily. What changed:    medication strength  how  much to take   potassium chloride SA 20 MEQ tablet Commonly known as:  K-DUR,KLOR-CON Take 1 tablet (20 mEq total) by mouth daily.   rosuvastatin 10 MG tablet Commonly known as:  CRESTOR Take 1 tablet (10 mg total) by mouth daily.   umeclidinium bromide 62.5 MCG/INH Aepb Commonly known as:  INCRUSE ELLIPTA Inhale 1 puff into the lungs daily.      Follow-up Information    [place], Modena Nunnery, MD. Schedule an appointment as soon as possible for a visit in 1 week(s).   Specialty:  Family Medicine Contact information: [address] [place] [address] [phone]        Pixie Casino, MD. Schedule an appointment as soon as possible for a visit in 4 week(s).   Specialty:  Cardiology Contact information: [place] [address] [phone]          Allergies  Allergen Reactions  . Niacin And Related Rash    Consultations:  Cardiology  Neurology  Procedures/Studies: Ct Head Wo Contrast  Result Date: 10/26/2017 CLINICAL DATA:  57 year old female reports a stroke in February this year with residual right-sided weakness, but increased right side weakness symptoms this morning with nausea vomiting. EXAM: CT HEAD WITHOUT CONTRAST TECHNIQUE: Contiguous axial images were obtained from the  base of the skull through the vertex without intravenous contrast. COMPARISON:  Brain MRI 01/24/2008 and head CT 01/23/2008. FINDINGS: Brain: Ex vacuo enlargement of the lateral ventricles has occurred since 2009, particularly both frontal horns where there is confluent bilateral periventricular white matter hypodensity, perhaps greater on the right. Bilateral basal ganglia and thalamic heterogeneity is compatible with scattered lacunar infarcts, and there is similar heterogeneity in the brainstem. No superimposed cortical encephalomalacia or cortically based acute infarct identified. The cerebellum remains relatively normal. No midline shift,  ventriculomegaly, mass effect, evidence of mass lesion, or intracranial hemorrhage. Vascular: Calcified atherosclerosis at the skull base. No suspicious intracranial vascular hyperdensity. Skull: Negative. Sinuses/Orbits: Clear. Other: Visualized orbits and scalp soft tissues are within normal limits. IMPRESSION: 1. Changes of advanced chronic small vessel disease are new since 2009 in the bilateral deep gray matter, brainstem, and anterior periventricular white matter. 2. No intracranial hemorrhage or acute cortically based infarct. Electronically Signed   By: Genevie Ann M.D.   On: 10/26/2017 08:44   Mr Brain Wo Contrast (neuro Protocol)  Result Date: 10/26/2017 CLINICAL DATA:  57 y/o F; nausea, vomiting, confusion. Worsening right-sided weakness. History of stroke. EXAM: MRI HEAD WITHOUT CONTRAST TECHNIQUE: Multiplanar, multiecho pulse sequences of the brain and surrounding structures were obtained without intravenous contrast. COMPARISON:  01/24/2008 MRI of the head.  10/26/2017 CT head. FINDINGS: Brain: Motion degraded study. No acute infarction, hemorrhage, hydrocephalus, extra-axial collection or mass lesion. Multiple T2 hyperintense foci are present throughout the basal ganglia as well as the pons compatible with chronic lacunar infarcts. Patchynonspecific foci of T2 FLAIR hyperintense signal abnormality in subcortical and periventricular white matter are compatible withadvancedchronic microvascular ischemic changes for age. Moderatebrain parenchymal volume loss. There multiple small foci of susceptibility hypointensity within the brainstem, basal ganglia, and left medial temporal lobe compatible hemosiderin deposition of chronic microhemorrhage as well as a small chronic hemorrhagic infarct within the left aspect of corpus callosum body. Vascular: Normal flow voids. Skull and upper cervical spine: Normal marrow signal. Sinuses/Orbits: Negative. Other: None. IMPRESSION: 1. Motion degradation of several  sequences. 2. No acute intracranial abnormality identified. 3. Age advanced chronic microvascular ischemic changes and moderate parenchymal volume loss of the brain. Multiple small chronic infarctions within basal ganglia and brainstem. 4. Several foci of chronic microhemorrhage with central distribution, probably related to hypertension. Electronically Signed   By: Kristine Garbe M.D.   On: 10/26/2017 13:41   Echocardiogram 10/27/2017: - Left ventricle: The cavity size was normal. Wall thickness was   increased in a pattern of mild LVH. Systolic function was normal.   The estimated ejection fraction was in the range of 55% to 60%.   Wall motion was normal; there were no regional wall motion   abnormalities. Features are consistent with a pseudonormal left   ventricular filling pattern, with concomitant abnormal relaxation   and increased filling pressure (grade 2 diastolic dysfunction). - Aortic valve: There was no stenosis. - Mitral valve: There was trivial regurgitation. - Right ventricle: The cavity size was normal. Systolic function   was normal. - Pulmonary arteries: No complete TR doppler jet so unable to   estimate PA systolic pressure. - Inferior vena cava: The vessel was normal in size. The   respirophasic diameter changes were in the normal range (>= 50%),   consistent with normal central venous pressure.  Impressions: - Normal LV size with mild LV hypertrophy. EF 55-60%. There was   moderate diastolic dysfunction. Normal RV size and systolic   function.  Subjective: Feels better, no further symptoms.   Discharge Exam: Vitals:   10/27/17 1127 10/27/17 1600  BP: (!) 178/73 (!) 165/73  Pulse: (!) 51 (!) 46  Resp: 18 18  Temp: 97.6 F (36.4 C) 98.5 F (36.9 C)  SpO2: 99% 98%   General: Pt is alert, awake, not in acute distress Cardiovascular: RRR, S1/S2 +, no rubs, no gallops Respiratory: CTA bilaterally, no wheezing, no rhonchi Abdominal: Soft, NT, ND, bowel  sounds + Extremities: No edema, no cyanosis  Labs:  Basic Metabolic Panel: Recent Labs  Lab 10/26/17 0852  NA 137  K 3.3*  CL 98*  CO2 26  GLUCOSE 121*  BUN 17  CREATININE 0.99  CALCIUM 9.1   Liver Function Tests: Recent Labs  Lab 10/26/17 0852  AST 16  ALT 10*  ALKPHOS 82  BILITOT 0.6  PROT 7.3  ALBUMIN 3.0*   CBC: Recent Labs  Lab 10/26/17 0852  WBC 6.0  NEUTROABS 4.1  HGB 11.2*  HCT 34.7*  MCV 84.8  PLT 482*   Cardiac Enzymes: Recent Labs  Lab 10/26/17 2015  TROPONINI <0.03   CBG: Recent Labs  Lab 10/26/17 1340 10/26/17 2201 10/27/17 0652 10/27/17 1127 10/27/17 1631  GLUCAP 77 183* 102* 144* 91   Urinalysis    Component Value Date/Time   COLORURINE AMBER (A) 10/27/2017 0633   APPEARANCEUR CLOUDY (A) 10/27/2017 0633   LABSPEC 1.023 10/27/2017 0633   PHURINE 6.0 10/27/2017 0633   GLUCOSEU NEGATIVE 10/27/2017 0633   HGBUR SMALL (A) 10/27/2017 0633   BILIRUBINUR NEGATIVE 10/27/2017 0633   KETONESUR NEGATIVE 10/27/2017 0633   PROTEINUR 30 (A) 10/27/2017 0633   UROBILINOGEN 0.2 01/26/2007 1410   NITRITE NEGATIVE 10/27/2017 0633   LEUKOCYTESUR LARGE (A) 10/27/2017 6438     Time coordinating discharge: Approximately 40 minutes  Vance Gather, MD  [facility] 10/30/2017, 5:54 PM Pager [phone]

## 2017-10-27 NOTE — Progress Notes (Signed)
  Echocardiogram 2D Echocardiogram has been performed.  Kelly Gibson 10/27/2017, 3:26 PM

## 2017-10-27 NOTE — Progress Notes (Signed)
Subjective: She feels much improved.  Exam: Vitals:   10/27/17 1020 10/27/17 1127  BP:  (!) 178/73  Pulse:  (!) 51  Resp:  18  Temp:  97.6 F (36.4 C)  SpO2: 98% 99%   Gen: In bed, NAD Resp: non-labored breathing, no acute distress Abd: soft, nt  Neuro: MS: Awake, alert, interactive and appropriate CN: Pupils equal round reactive to light, extra ocular movements intact Motor: Moves all extremities well Sensory: Intact light touch   Impression: 57 year old female who presented with dizziness.  It is possible that this represents recrudescence of her symptoms from a month ago.  She had a full workup at that time, though I do not have those records.  I do not feel that she has had a true new neurological event at this time.  Her LDL, A1c were elevated previously and secondary prevention would remain control of her underlying medical issues.  Her symptoms are relatively nonspecific, and I agree with looking into her bradycardia.  Recommendations: 1) continue statin therapy with goal LDL less than 70(started less than a month ago, so I am not certain rechecking it would be of benefit.) 2) continue glucose control with goal A1c less than 7 3) continue BP control with goal of normotension 4) no further neurological recommendations at this time, please call with further questions or concerns.  Roland Rack, MD [facility] [phone]  If 7pm- 7am, please page neurology on call as listed in [place].

## 2017-10-27 NOTE — Consult Note (Addendum)
Cardiology Consultation:   Patient ID: Kelly Gibson; [id number]; 02/04/1961   Admit date: 10/26/2017 Date of Consult: 10/27/2017  Primary Care Provider: Alycia Rossetti, MD Primary Cardiologist: Gay Filler  Patient Profile:   Kelly Gibson is a 57 y.o. female with a hx of HTN, HLD, COPD, depression and recent subacute stroke in Feb 2019 in [place], [place] who was admitted to [facility] on 10/26/17 with possible recrudescence of previous stroke symptoms per Neuro team (neg MRI) who is now being seen by Cardiology for the evaluation of bradycardia at the request of Dr. Aggie Moats.   History of Present Illness:   Kelly Gibson is a 57yo F with a hx as stated above who was admitted to [facility] on 10/26/17 with c/o right sided weakness, N/V and confusion who presented to the ED for fear that she was experiencing another stroke, as she was just recently dx with acute CVA in Feb of this year in [facility] (request has been made for records from previous hospital). Neurology was consulted on admission given symptoms. An MRI completed per neuro which was negative for acute CVA on 10/25/17.   In the ED, she was found to have an elevated BP at 199/75. She was started on her home HCTZ and lisinopril with moderate response. On telemetry, she was found to be bradycardic in the upper 40's-50's with the lowest reading being 42 bpm. Most rates have been in the 45-57 range.  Of note, she was seen by her family PCP on 10/18/17 with a HR in the 80's. She currently reports being dizzy however, she states that this has been ongoing since her stroke several months ago. She is on no blocking agents. She denies chest pain or recent syncopal events.   Cardiology has been consulted given her new bradycardia.   Past Medical History:  Diagnosis Date  . COPD (chronic obstructive pulmonary disease) ([place])   . Depression   . Diabetes mellitus   . Hyperlipidemia   . Hypertension     Past Surgical History:  Procedure  Laterality Date  . CESAREAN SECTION     x 3  . TUMOR REMOVAL     left shoulder     Prior to Admission medications   Medication Sig Start Date End Date Taking? Authorizing Provider  albuterol (PROVENTIL HFA;VENTOLIN HFA) 108 (90 Base) MCG/ACT inhaler Inhale 2 puffs into the lungs every 6 (six) hours as needed for wheezing or shortness of breath. 10/18/17  Yes [place], Modena Nunnery, MD  albuterol (PROVENTIL) (2.5 MG/3ML) 0.083% nebulizer solution Take 3 mLs (2.5 mg total) by nebulization every 6 (six) hours as needed for wheezing or shortness of breath. 08/16/16  Yes Everlene Balls, MD  aspirin EC 81 MG tablet Take 81 mg by mouth daily.   Yes [provider]  glyBURIDE (DIABETA) 2.5 MG tablet One tab daily 05/15/16  Yes [place], Modena Nunnery, MD  hydrochlorothiazide (HYDRODIURIL) 25 MG tablet Take 1 tablet (25 mg total) by mouth daily. 10/18/17  Yes [place], Modena Nunnery, MD  Ibuprofen-diphenhydrAMINE Cit (ADVIL PM PO) Take 1 tablet by mouth as needed (for sleep).   Yes [provider]  Insulin Glargine (TOUJEO SOLOSTAR) 300 UNIT/ML SOPN Inject 20 Units into the skin at bedtime. 10/21/17  Yes [place], Modena Nunnery, MD  lisinopril (PRINIVIL,ZESTRIL) 20 MG tablet Take 1 tablet (20 mg total) by mouth daily. 10/18/17  Yes [place], Modena Nunnery, MD  potassium chloride SA (K-DUR,KLOR-CON) 20 MEQ tablet Take 1 tablet (20 mEq total)  by mouth daily. 10/18/17  Yes [place], Modena Nunnery, MD  rosuvastatin (CRESTOR) 10 MG tablet Take 1 tablet (10 mg total) by mouth daily. 10/18/17  Yes [place], Modena Nunnery, MD  umeclidinium bromide (INCRUSE ELLIPTA) 62.5 MCG/INH AEPB Inhale 1 puff into the lungs daily. 10/18/17  Yes [place], Modena Nunnery, MD  Glucose Blood (BLOOD GLUCOSE TEST STRIPS) STRP Please dispense per patient and insurance preference. Use as directed to monitor FSBS  3x daily. Dx: E11.65. 10/18/17   Alycia Rossetti, MD  Lancet Devices MISC Please dispense per patient and insurance preference. Use as directed to monitor FSBS   3x daily. Dx: E11.65. 10/18/17   Alycia Rossetti, MD    Inpatient Medications: Scheduled Meds: . aspirin EC  81 mg Oral Daily  . hydrochlorothiazide  50 mg Oral Daily  . insulin aspart  0-15 Units Subcutaneous TID WC  . insulin glargine  10 Units Subcutaneous QHS  . lisinopril  20 mg Oral Daily  . potassium chloride SA  20 mEq Oral Daily  . rosuvastatin  10 mg Oral Daily  . umeclidinium bromide  1 puff Inhalation Daily   Continuous Infusions:  PRN Meds:   Allergies:    Allergies  Allergen Reactions  . Niacin And Related Rash    Social History:   Social History   Socioeconomic History  . Marital status: Married    Spouse name: Not on file  . Number of children: Not on file  . Years of education: Not on file  . Highest education level: Not on file  Occupational History  . Not on file  Social Needs  . Financial resource strain: Not on file  . Food insecurity:    Worry: Not on file    Inability: Not on file  . Transportation needs:    Medical: Not on file    Non-medical: Not on file  Tobacco Use  . Smoking status: Former Research scientist (life sciences)  . Smokeless tobacco: Never Used  . Tobacco comment: stopped 2018  Substance and Sexual Activity  . Alcohol use: No  . Drug use: No  . Sexual activity: Not on file  Lifestyle  . Physical activity:    Days per week: Not on file    Minutes per session: Not on file  . Stress: Not on file  Relationships  . Social connections:    Talks on phone: Not on file    Gets together: Not on file    Attends religious service: Not on file    Active member of club or organization: Not on file    Attends meetings of clubs or organizations: Not on file    Relationship status: Not on file  . Intimate partner violence:    Fear of current or ex partner: Not on file    Emotionally abused: Not on file    Physically abused: Not on file    Forced sexual activity: Not on file  Other Topics Concern  . Not on file  Social History Narrative  . Not on  file    Family History:   Family History  Problem Relation Age of Onset  . Diabetes Mother   . Heart disease Father   . Cancer Father    Family Status:  Family Status  Relation Name Status  . Mother  Deceased  . Father  Deceased    ROS:  Please see the history of present illness.  All other ROS reviewed and negative.     Physical Exam/Data:   Vitals:  10/27/17 0500 10/27/17 0752 10/27/17 1020 10/27/17 1127  BP: (!) 163/66 (!) 182/83  (!) 178/73  Pulse: (!) 52 (!) 50  (!) 51  Resp: 18 17  18   Temp: 98.7 F (37.1 C) (!) 97.4 F (36.3 C)  97.6 F (36.4 C)  TempSrc: Oral Axillary  Axillary  SpO2: 98% 97% 98% 99%  Weight:      Height:        Intake/Output Summary (Last 24 hours) at 10/27/2017 1548 Last data filed at 10/27/2017 0500 Gross per 24 hour  Intake 560 ml  Output 350 ml  Net 210 ml   Filed Weights   10/26/17 2120  Weight: 155 lb (70.3 kg)   Body mass index is 28.35 kg/m.   General: Well developed, well nourished, NAD Skin: Warm, dry, intact  Head: Normocephalic, atraumatic, clear, moist mucus membranes. Neck: Negative for carotid bruits. No JVD Lungs:Clear to ausculation bilaterally. No wheezes, rales, or rhonchi. Breathing is unlabored. Cardiovascular: RRR with S1 S2. No murmurs, rubs or gallops Abdomen: Soft, non-tender, non-distended with normoactive bowel sounds. No obvious abdominal masses. MSK: Strength and tone appear normal for age. 5/5 in all extremities Extremities: No edema. No clubbing or cyanosis. DP/PT pulses 2+ bilaterally Neuro: Alert and oriented. No focal deficits. No facial asymmetry. MAE spontaneously. Psych: Responds to questions appropriately with normal affect.     EKG:  The EKG was personally reviewed and demonstrates:  10/26/17 SB with first degree AV block  HR 47 Telemetry:  Telemetry was personally reviewed and demonstrates: 10/26/17 SB/junctional HR 47  Relevant CV Studies:  ECHO: Pending results   CATH:  NA  Laboratory Data:  Chemistry Recent Labs  Lab 10/26/17 0852  NA 137  K 3.3*  CL 98*  CO2 26  GLUCOSE 121*  BUN 17  CREATININE 0.99  CALCIUM 9.1  GFRNONAA >60  GFRAA >60  ANIONGAP 13    Total Protein  Date Value Ref Range Status  10/26/2017 7.3 6.5 - 8.1 g/dL Final   Albumin  Date Value Ref Range Status  10/26/2017 3.0 (L) 3.5 - 5.0 g/dL Final   AST  Date Value Ref Range Status  10/26/2017 16 15 - 41 U/L Final   ALT  Date Value Ref Range Status  10/26/2017 10 (L) 14 - 54 U/L Final   Alkaline Phosphatase  Date Value Ref Range Status  10/26/2017 82 38 - 126 U/L Final   Total Bilirubin  Date Value Ref Range Status  10/26/2017 0.6 0.3 - 1.2 mg/dL Final   Hematology Recent Labs  Lab 10/26/17 0852  WBC 6.0  RBC 4.09  HGB 11.2*  HCT 34.7*  MCV 84.8  MCH 27.4  MCHC 32.3  RDW 12.4  PLT 482*   Cardiac Enzymes Recent Labs  Lab 10/26/17 2015  TROPONINI <0.03    Recent Labs  Lab 10/26/17 0906  TROPIPOC 0.00    BNPNo results for input(s): BNP, PROBNP in the last 168 hours.  DDimer No results for input(s): DDIMER in the last 168 hours. TSH:  Lab Results  Component Value Date   TSH 1.274 02/18/2015   Lipids: Lab Results  Component Value Date   CHOL 233 (H) 05/15/2016   HDL 45 (L) 05/15/2016   LDLCALC 153 (H) 05/15/2016   LDLDIRECT 217 (H) 12/13/2015   TRIG 175 (H) 05/15/2016   CHOLHDL 5.2 (H) 05/15/2016   HgbA1c: Lab Results  Component Value Date   HGBA1C 12.2 (H) 10/18/2017    Radiology/Studies:  Ct Head Wo  Contrast  Result Date: 10/26/2017 CLINICAL DATA:  57 year old female reports a stroke in February this year with residual right-sided weakness, but increased right side weakness symptoms this morning with nausea vomiting. EXAM: CT HEAD WITHOUT CONTRAST TECHNIQUE: Contiguous axial images were obtained from the base of the skull through the vertex without intravenous contrast. COMPARISON:  Brain MRI 01/24/2008 and head CT 01/23/2008.  FINDINGS: Brain: Ex vacuo enlargement of the lateral ventricles has occurred since 2009, particularly both frontal horns where there is confluent bilateral periventricular white matter hypodensity, perhaps greater on the right. Bilateral basal ganglia and thalamic heterogeneity is compatible with scattered lacunar infarcts, and there is similar heterogeneity in the brainstem. No superimposed cortical encephalomalacia or cortically based acute infarct identified. The cerebellum remains relatively normal. No midline shift, ventriculomegaly, mass effect, evidence of mass lesion, or intracranial hemorrhage. Vascular: Calcified atherosclerosis at the skull base. No suspicious intracranial vascular hyperdensity. Skull: Negative. Sinuses/Orbits: Clear. Other: Visualized orbits and scalp soft tissues are within normal limits. IMPRESSION: 1. Changes of advanced chronic small vessel disease are new since 2009 in the bilateral deep gray matter, brainstem, and anterior periventricular white matter. 2. No intracranial hemorrhage or acute cortically based infarct. Electronically Signed   By: Genevie Ann M.D.   On: 10/26/2017 08:44   Mr Brain Wo Contrast (neuro Protocol)  Result Date: 10/26/2017 CLINICAL DATA:  57 y/o F; nausea, vomiting, confusion. Worsening right-sided weakness. History of stroke. EXAM: MRI HEAD WITHOUT CONTRAST TECHNIQUE: Multiplanar, multiecho pulse sequences of the brain and surrounding structures were obtained without intravenous contrast. COMPARISON:  01/24/2008 MRI of the head.  10/26/2017 CT head. FINDINGS: Brain: Motion degraded study. No acute infarction, hemorrhage, hydrocephalus, extra-axial collection or mass lesion. Multiple T2 hyperintense foci are present throughout the basal ganglia as well as the pons compatible with chronic lacunar infarcts. Patchynonspecific foci of T2 FLAIR hyperintense signal abnormality in subcortical and periventricular white matter are compatible withadvancedchronic  microvascular ischemic changes for age. Moderatebrain parenchymal volume loss. There multiple small foci of susceptibility hypointensity within the brainstem, basal ganglia, and left medial temporal lobe compatible hemosiderin deposition of chronic microhemorrhage as well as a small chronic hemorrhagic infarct within the left aspect of corpus callosum body. Vascular: Normal flow voids. Skull and upper cervical spine: Normal marrow signal. Sinuses/Orbits: Negative. Other: None. IMPRESSION: 1. Motion degradation of several sequences. 2. No acute intracranial abnormality identified. 3. Age advanced chronic microvascular ischemic changes and moderate parenchymal volume loss of the brain. Multiple small chronic infarctions within basal ganglia and brainstem. 4. Several foci of chronic microhemorrhage with central distribution, probably related to hypertension. Electronically Signed   By: Kristine Garbe M.D.   On: 10/26/2017 13:41   Assessment and Plan:   1. Bradycardia: -EKG from 10/26/17 with SB with HR 45 bpm -Trop, <0.03 -Per tele review, pt with HR's in the 45-55 range; lowest seems to be 42 -Hard to determine if symptoms are related to recent CVA, hypoglycemia, or are new bradycardia. Denies syncope and she is on no BB agents  -Will continue to monitor for now and if symptoms progress -Echo with evidence of LVH, normal LVEF function and mild MR per MD personal review  -Will set her up for 30 day event monitor; pending d/c   2. Recent subacute CVA 08/2017: -MRI on 10/26/17 with no evidence of acute infarct  -Per primary team, aggressive secondary prevention given elevated A1C and LDL -ASA 81mg  PO QD, high intensity statin   3. Uncontrolled DM II: -HB A1c,12.2 -Per  primary team.  -Of note, her insulin has recently been increased with reports of episodic hypoglycemia and CBG's in the 60's -Will need follow up post discharge with PCP  4. HTN: -Continue HCTZ, lisinopril and add  amlodipine  -Will increase Lisinopril to 40mg  PO QD  -Will decrease HCTZ to 25mg  and add Amlodipine 5mg   -Renal function stable at 0.99. Baseline appears to be in the 0.7-0.8 range   5. HLD: -Continue statin  -LDL in 2017    For questions or updates, please contact [place] Please consult [email or web address] for contact info under Cardiology/STEMI.   SignedKathyrn Drown NP-C [facility] Pager: [phone] 10/27/2017 3:48 PM

## 2017-10-27 NOTE — Progress Notes (Signed)
Pt discharge education and instructions completed with pt and family at bedside; both voices understanding and denies any questions. Pt discharge home with family to transport her home. Pt to pick up electronically sent prescriptions from preferred pharmacy on file. Pt transported off unit via wheelchair with belongings and family to the side. Delia Heady RN

## 2017-10-27 NOTE — Care Management Note (Signed)
Case Management Note  Patient Details  Name: Kelly Gibson MRN: [id number] Date of Birth: 03/19/1961  Subjective/Objective:     Pt in with weakness. She is from home with her spouse.               Action/Plan: CM consulted for medication assistance. Pt has BCBS but still struggles with some of the co pays of her medications, esp: inhaler and insulin. CM provided her coupons for her Toujeo, Proventil, and Incruse. Pt appreciative. CM following for further d/c needs.    Expected Discharge Date:                  Expected Discharge Plan:  [place]  In-House Referral:     Discharge planning Services  CM Consult, Medication Assistance  Post Acute Care Choice:  Home Health Choice offered to:  Patient, Spouse  DME Arranged:    DME Agency:     HH Arranged:    HH Agency:     Status of Service:  In process, will continue to follow  If discussed at Long Length of Stay Meetings, dates discussed:    Additional Comments:  Kelly Friar, RN 10/27/2017, 11:15 AM

## 2017-10-28 ENCOUNTER — Ambulatory Visit (HOSPITAL_COMMUNITY): Payer: BLUE CROSS/BLUE SHIELD

## 2017-10-28 ENCOUNTER — Telehealth (HOSPITAL_COMMUNITY): Payer: Self-pay

## 2017-10-28 ENCOUNTER — Other Ambulatory Visit: Payer: Self-pay | Admitting: *Deleted

## 2017-10-28 ENCOUNTER — Encounter (HOSPITAL_COMMUNITY): Payer: Self-pay

## 2017-10-28 MED ORDER — INSULIN PEN NEEDLE 32G X 6 MM MISC: 3 refills | Status: DC

## 2017-10-28 MED ORDER — HYDROCHLOROTHIAZIDE 25 MG PO TABS: 25.0000 mg | ORAL_TABLET | Freq: Every day | ORAL | 1 refills | Status: DC

## 2017-10-28 MED ORDER — BLOOD GLUCOSE TEST VI STRP: ORAL_STRIP | 11 refills | Status: DC

## 2017-10-28 NOTE — Care Management Note (Signed)
Case Management Note  Patient Details  Name: Kelly Gibson MRN: [id number] Date of Birth: 12/15/1960  Subjective/Objective:                    Action/Plan: Pt discharging home with orders for [facility] services. CM provided her choice and she selected [facility] who is not in network with [employer]. She then selected [facility] who does not have the staffing. [place] then selected and able to accept the referral.  Patients spouse to provide supervision at home and transportation home.   Expected Discharge Date:  10/27/17               Expected Discharge Plan:  [place]  In-House Referral:     Discharge planning Services  CM Consult, Medication Assistance  Post Acute Care Choice:  Home Health Choice offered to:  Patient, Spouse  DME Arranged:    DME Agency:     HH Arranged:  PT [place]:  [place]  Status of Service:  Completed, signed off  If discussed at [place] of Stay Meetings, dates discussed:    Additional Comments:  Pollie Friar, RN 10/28/2017, 9:13 AM

## 2017-10-28 NOTE — Telephone Encounter (Signed)
Pt arrived for her OPPT eval, however, upon chart review, PT noticed that pt was recommended for home health services following her most recent hospital discharge on 10/27/17; PT asked if they were actively pursuing the home health services and they stated yes since she isn't driving right now and her husband needs to return to work/can't stay with her during the day to bring her to her [place] appointments. PT explained to pt and her husband in the waiting room that she cannot be active in both home health and outpatient therapy services at the same time. Pt and her husband stated that they wished to pursue HHPT for now and will reschedule her OPPT appointment for a later date.   Geraldine Solar PT, DPT

## 2017-10-28 NOTE — Progress Notes (Signed)
CM received phone call from [place] with [facility] that the patients [place] does not have any HH service benefits. CM notified the patient that she would not be receiving HH d/t the above information. CM offered private pay with patient refusing.

## 2017-11-03 ENCOUNTER — Telehealth: Payer: Self-pay | Admitting: *Deleted

## 2017-11-03 ENCOUNTER — Ambulatory Visit: Payer: BLUE CROSS/BLUE SHIELD | Admitting: Family Medicine

## 2017-11-03 ENCOUNTER — Encounter: Payer: Self-pay | Admitting: Family Medicine

## 2017-11-03 ENCOUNTER — Other Ambulatory Visit: Payer: Self-pay

## 2017-11-03 VITALS — BP 130/64 | HR 52 | Temp 98.0°F | Resp 18 | Ht 62.0 in | Wt 155.0 lb

## 2017-11-03 DIAGNOSIS — I10 Essential (primary) hypertension: Secondary | ICD-10-CM

## 2017-11-03 DIAGNOSIS — E782 Mixed hyperlipidemia: Secondary | ICD-10-CM

## 2017-11-03 DIAGNOSIS — Z113 Encounter for screening for infections with a predominantly sexual mode of transmission: Secondary | ICD-10-CM | POA: Diagnosis not present

## 2017-11-03 DIAGNOSIS — E876 Hypokalemia: Secondary | ICD-10-CM

## 2017-11-03 DIAGNOSIS — I679 Cerebrovascular disease, unspecified: Secondary | ICD-10-CM | POA: Diagnosis not present

## 2017-11-03 DIAGNOSIS — E1165 Type 2 diabetes mellitus with hyperglycemia: Secondary | ICD-10-CM

## 2017-11-03 DIAGNOSIS — R531 Weakness: Secondary | ICD-10-CM | POA: Diagnosis not present

## 2017-11-03 LAB — WET PREP FOR TRICH, YEAST, CLUE

## 2017-11-03 NOTE — Telephone Encounter (Signed)
Received call from patient.   Reports that she forgot to mention that she is always extremely fatigued.   MD reviewed and advised that patient is S/P CVA and has uncontrolled DM. Advised to take medications as prescribed and fatigue will improve while DM improves. States that she can take OTC multivitamin to help with fatigue.

## 2017-11-03 NOTE — Assessment & Plan Note (Signed)
She did not bring any readings with her to verify what her blood sugars have been running.  She can stay off of the Metformin for now.  Continue the proceed with 10 units in the glyburide but expect that her blood sugars are going to rebound back up once her body has stabilized from this likely GI illness.

## 2017-11-03 NOTE — Progress Notes (Signed)
Subjective:    Patient ID: Kelly Gibson, female    DOB: 1960-08-07, 57 y.o.   MRN: [id number]  Patient presents for Hospital F/U  Pt here for hospital f/u she was admitted after having episode of weakness where she had nausea and vomiting that she woke up with.  Her blood sugar was down to 60 she states per EMS.  She was given IV fluid resuscitation.  There was concern that this may be a second stroke she does have a recent one however MRI was negative for acute stroke.  Neurology did not recommend any changes to her medications but advised her to follow-up with him in a few weeks.  She was also evaluated by cardiology she was noted to have persistent bradycardia and junctional bradycardia.  He is supposed to have a Holter monitor sent to her home which she has been aware and then follow-up with cardiology in a few weeks as well. Has residual right-sided weakness from her recent stroke she needs outpatient occupational therapy  Diabetes mellitus her A1c was 12.2% which I had noted in my previous chart.  I discontinue her metformin secondary to renal insufficiency unknown what her baseline has been that she had not been to the office in about 2 years.  With IV resuscitation her atony did improve to normal and they did keep her off of the metformin.  Her Tarceva however was decreased down to 10 units she was continued on the glyburide.  She did not bring her meter today but states that her blood sugars have been good states it is been up to 175.  Hypertension her blood pressure was significantly elevated she was continued on hydrochlorothiazide her lisinopril was increased to 40 mg and amlodipine was added.  Hyperlipidemia she was continued on her Crestor which she states she is taking.  She was also treated for trichomonas which was noted in her urinalysis she however states that she has not been sexually active in the past few years.    Review Of Systems:  GEN- + fatigue, fever, weight  loss,weakness, recent illness HEENT- denies eye drainage, change in vision, nasal discharge, CVS- denies chest pain, palpitations RESP- denies SOB, cough, wheeze ABD- denies N/V, change in stools, abd pain GU- denies dysuria, hematuria, dribbling, incontinence MSK- denies joint pain, muscle aches, injury Neuro- denies headache, dizziness, syncope, seizure activity       Objective:    BP 130/64   Pulse (!) 52   Temp 98 F (36.7 C) (Oral)   Resp 18   Ht 5\' 2"  (1.575 m)   Wt 155 lb (70.3 kg)   LMP 12/10/2012   SpO2 99%   BMI 28.35 kg/m  GEN- NAD, alert and oriented x3 HEENT- PERRL, EOMI, non injected sclera, pink conjunctiva, MMM, oropharynx clear Neck- Supple, no thyromegaly CVS- RRR, no murmur RESP-CTAB ABD-NABS,soft,NT,ND Neuro- CNII-XII in tact, decreased sensation to touch right side of face compared to left, strength decreased RUE compared to left, normal tone,  EXT- No edema Pulses- Radial  2+      Assessment & Plan:  Message sent to occupational therapist who contacted the patient for outpatient therapy they are okay to start services   Problem List Items Addressed This Visit      Unprioritized   Hyperlipidemia   Weakness   Essential hypertension, benign    Blood pressure looks good no changes      Diabetes mellitus type II, uncontrolled ([place])    She did not bring any  readings with her to verify what her blood sugars have been running.  She can stay off of the Metformin for now.  Continue the proceed with 10 units in the glyburide but expect that her blood sugars are going to rebound back up once her body has stabilized from this likely GI illness.      Cerebrovascular disease    She will benefit from therapy to arrange this as an outpatient.  She is continued on her current medications she will follow-up with neurology and cardiology after Holter monitor.  Have some hypokalemia which has been chronic she was on potassium supplement however her lisinopril  was increased today to see where her potassium level looks like before putting her back on the same dose of potassium supplement.       Other Visit Diagnoses    Screen for STD (sexually transmitted disease)    -  Primary   Relevant Orders   WET PREP FOR [place], YEAST, CLUE (Completed)   C. trachomatis/N. gonorrhoeae RNA   HIV antibody   Hepatitis C antibody   Hypokalemia       Relevant Orders   Basic metabolic panel      Note: This dictation was prepared with Dragon dictation along with smaller phrase technology. Any transcriptional errors that result from this process are unintentional.

## 2017-11-03 NOTE — Assessment & Plan Note (Signed)
>>  ASSESSMENT AND PLAN FOR TYPE 2 DIABETES MELLITUS WITH DIABETIC NEUROPATHY, WITH LONG-TERM CURRENT USE OF INSULIN  ([facility]) WRITTEN ON 11/03/2017  6:09 PM BY Holt, KAWANTA F  She did not bring any readings with her to verify what her blood sugars have been running.  She can stay off of the Metformin  for now.  Continue the proceed with 10 units in the glyburide  but expect that her blood sugars are going to rebound back up once her body has stabilized from this likely GI illness.

## 2017-11-03 NOTE — Patient Instructions (Addendum)
Referral to OT/PT We will call with results Continue Tresiba 10units Do not take the metformin F/U Change f/u to end of May

## 2017-11-03 NOTE — Assessment & Plan Note (Signed)
She will benefit from therapy to arrange this as an outpatient.  She is continued on her current medications she will follow-up with neurology and cardiology after Holter monitor.  Have some hypokalemia which has been chronic she was on potassium supplement however her lisinopril was increased today to see where her potassium level looks like before putting her back on the same dose of potassium supplement.

## 2017-11-03 NOTE — Assessment & Plan Note (Signed)
Blood pressure looks good no changes  

## 2017-11-03 NOTE — Assessment & Plan Note (Signed)
>>  ASSESSMENT AND PLAN FOR ESSENTIAL HYPERTENSION, BENIGN WRITTEN ON 11/03/2017  6:09 PM BY [place], KAWANTA F  Blood pressure looks good no changes

## 2017-11-03 NOTE — Telephone Encounter (Signed)
Call placed to patient and patient made aware.  

## 2017-11-04 ENCOUNTER — Ambulatory Visit (INDEPENDENT_AMBULATORY_CARE_PROVIDER_SITE_OTHER): Payer: BLUE CROSS/BLUE SHIELD

## 2017-11-04 ENCOUNTER — Other Ambulatory Visit: Payer: Self-pay | Admitting: Cardiology

## 2017-11-04 DIAGNOSIS — R001 Bradycardia, unspecified: Secondary | ICD-10-CM | POA: Diagnosis not present

## 2017-11-04 DIAGNOSIS — R42 Dizziness and giddiness: Secondary | ICD-10-CM

## 2017-11-04 LAB — BASIC METABOLIC PANEL
BUN/Creatinine Ratio: 19 (calc) (ref 6–22)
BUN: 22 mg/dL (ref 7–25)
CHLORIDE: 98 mmol/L (ref 98–110)
CO2: 29 mmol/L (ref 20–32)
CREATININE: 1.16 mg/dL — AB (ref 0.50–1.05)
Calcium: 9.7 mg/dL (ref 8.6–10.4)
Glucose, Bld: 118 mg/dL — ABNORMAL HIGH (ref 65–99)
Potassium: 4.2 mmol/L (ref 3.5–5.3)
Sodium: 141 mmol/L (ref 135–146)

## 2017-11-04 LAB — HIV ANTIBODY (ROUTINE TESTING W REFLEX): HIV: NONREACTIVE

## 2017-11-04 LAB — C. TRACHOMATIS/N. GONORRHOEAE RNA
C. TRACHOMATIS RNA, TMA: NOT DETECTED
N. GONORRHOEAE RNA, TMA: NOT DETECTED

## 2017-11-04 LAB — HEPATITIS C ANTIBODY
Hepatitis C Ab: NONREACTIVE
SIGNAL TO CUT-OFF: 0.03 (ref <1.00)

## 2017-11-04 LAB — EXTRA LAV TOP TUBE

## 2017-11-10 ENCOUNTER — Telehealth (HOSPITAL_COMMUNITY): Payer: Self-pay | Admitting: Family Medicine

## 2017-11-10 NOTE — Telephone Encounter (Signed)
11/10/17  I called her insurance company and was told that PT isn't covered by in or out of network providers.  I called patient to tell her and she said that she wouldn't be coming since its not covered

## 2017-11-11 ENCOUNTER — Ambulatory Visit (HOSPITAL_COMMUNITY): Payer: BLUE CROSS/BLUE SHIELD | Admitting: Physical Therapy

## 2017-11-22 ENCOUNTER — Ambulatory Visit: Payer: BLUE CROSS/BLUE SHIELD | Admitting: Family Medicine

## 2017-12-01 ENCOUNTER — Telehealth: Payer: Self-pay | Admitting: Medical

## 2017-12-01 NOTE — Telephone Encounter (Signed)
Patient returned phone call. States she was sleeping at the time of observed pause. No complaints of CP, palpitations, dizziness, lightheadedness, or syncope.

## 2017-12-01 NOTE — Telephone Encounter (Signed)
Notified by event monitor company that patient had a 3-4 second pause shortly after 5:00am this morning. She was reported to be in sinus rhythm after the pause. Multiple attempts to contact patient were unsuccessful. Voicemail left on home number. Given brief pause and confirmation of NSR following pause, will not make further attempts to contact patient at this time.

## 2017-12-03 ENCOUNTER — Telehealth: Payer: Self-pay

## 2017-12-03 NOTE — Telephone Encounter (Signed)
Routed to MD & monitor strips given to MD

## 2017-12-03 NOTE — Telephone Encounter (Signed)
Noctural pauses of up to 3 seconds. Not on a AV nodal blocking agent -? Underlying sleep apnea. Continue to monitor.  Dr. Lemmie Evens

## 2017-12-03 NOTE — Telephone Encounter (Signed)
Received call from [place] from [facility] calling to report patient had several pauses last night 3.1 sec pauses.Patient was called she was sleeping and unaware.She will fax strips for Dr.Hilty to review.

## 2017-12-03 NOTE — Telephone Encounter (Signed)
Monitor reviewed by MD. No changes to current therapy. Continue monitor.

## 2017-12-04 ENCOUNTER — Telehealth: Payer: Self-pay | Admitting: Internal Medicine

## 2017-12-04 NOTE — Telephone Encounter (Signed)
12/04/17 5:44 AM  Received call overnight from [facility]. Overnight, the patient had 3.8 second pause. She did not answer when they called. She is back in sinus rhythm at this time.  Soyla Murphy, MD

## 2017-12-05 ENCOUNTER — Emergency Department (HOSPITAL_COMMUNITY): Payer: BLUE CROSS/BLUE SHIELD

## 2017-12-05 ENCOUNTER — Other Ambulatory Visit: Payer: Self-pay

## 2017-12-05 ENCOUNTER — Inpatient Hospital Stay (HOSPITAL_COMMUNITY)
Admission: EM | Admit: 2017-12-05 | Discharge: 2017-12-09 | DRG: 065 | Disposition: A | Discharge status: Rehabilitation Facility | Payer: BLUE CROSS/BLUE SHIELD | Attending: Internal Medicine | Admitting: Internal Medicine

## 2017-12-05 ENCOUNTER — Encounter (HOSPITAL_COMMUNITY): Payer: Self-pay | Admitting: Emergency Medicine

## 2017-12-05 DIAGNOSIS — I69398 Other sequelae of cerebral infarction: Secondary | ICD-10-CM | POA: Diagnosis not present

## 2017-12-05 DIAGNOSIS — Z79899 Other long term (current) drug therapy: Secondary | ICD-10-CM

## 2017-12-05 DIAGNOSIS — I679 Cerebrovascular disease, unspecified: Secondary | ICD-10-CM | POA: Diagnosis present

## 2017-12-05 DIAGNOSIS — Z794 Long term (current) use of insulin: Secondary | ICD-10-CM | POA: Diagnosis not present

## 2017-12-05 DIAGNOSIS — I169 Hypertensive crisis, unspecified: Secondary | ICD-10-CM | POA: Diagnosis not present

## 2017-12-05 DIAGNOSIS — I639 Cerebral infarction, unspecified: Secondary | ICD-10-CM | POA: Diagnosis present

## 2017-12-05 DIAGNOSIS — R269 Unspecified abnormalities of gait and mobility: Secondary | ICD-10-CM | POA: Diagnosis not present

## 2017-12-05 DIAGNOSIS — R29810 Facial weakness: Secondary | ICD-10-CM | POA: Diagnosis present

## 2017-12-05 DIAGNOSIS — I69351 Hemiplegia and hemiparesis following cerebral infarction affecting right dominant side: Secondary | ICD-10-CM

## 2017-12-05 DIAGNOSIS — R4702 Dysphasia: Secondary | ICD-10-CM | POA: Diagnosis present

## 2017-12-05 DIAGNOSIS — I63522 Cerebral infarction due to unspecified occlusion or stenosis of left anterior cerebral artery: Principal | ICD-10-CM | POA: Diagnosis present

## 2017-12-05 DIAGNOSIS — J9811 Atelectasis: Secondary | ICD-10-CM | POA: Diagnosis present

## 2017-12-05 DIAGNOSIS — I69322 Dysarthria following cerebral infarction: Secondary | ICD-10-CM | POA: Diagnosis not present

## 2017-12-05 DIAGNOSIS — Z720 Tobacco use: Secondary | ICD-10-CM | POA: Diagnosis not present

## 2017-12-05 DIAGNOSIS — Z87891 Personal history of nicotine dependence: Secondary | ICD-10-CM

## 2017-12-05 DIAGNOSIS — R590 Localized enlarged lymph nodes: Secondary | ICD-10-CM | POA: Diagnosis present

## 2017-12-05 DIAGNOSIS — J449 Chronic obstructive pulmonary disease, unspecified: Secondary | ICD-10-CM | POA: Diagnosis present

## 2017-12-05 DIAGNOSIS — I10 Essential (primary) hypertension: Secondary | ICD-10-CM | POA: Diagnosis present

## 2017-12-05 DIAGNOSIS — I340 Nonrheumatic mitral (valve) insufficiency: Secondary | ICD-10-CM | POA: Diagnosis not present

## 2017-12-05 DIAGNOSIS — I69391 Dysphagia following cerebral infarction: Secondary | ICD-10-CM | POA: Diagnosis not present

## 2017-12-05 DIAGNOSIS — R001 Bradycardia, unspecified: Secondary | ICD-10-CM | POA: Diagnosis present

## 2017-12-05 DIAGNOSIS — R1310 Dysphagia, unspecified: Secondary | ICD-10-CM | POA: Diagnosis present

## 2017-12-05 DIAGNOSIS — E785 Hyperlipidemia, unspecified: Secondary | ICD-10-CM | POA: Diagnosis present

## 2017-12-05 DIAGNOSIS — E1140 Type 2 diabetes mellitus with diabetic neuropathy, unspecified: Secondary | ICD-10-CM | POA: Diagnosis present

## 2017-12-05 DIAGNOSIS — R29705 NIHSS score 5: Secondary | ICD-10-CM | POA: Diagnosis present

## 2017-12-05 DIAGNOSIS — Z7982 Long term (current) use of aspirin: Secondary | ICD-10-CM | POA: Diagnosis not present

## 2017-12-05 DIAGNOSIS — Z7952 Long term (current) use of systemic steroids: Secondary | ICD-10-CM

## 2017-12-05 DIAGNOSIS — E119 Type 2 diabetes mellitus without complications: Secondary | ICD-10-CM | POA: Diagnosis present

## 2017-12-05 DIAGNOSIS — E876 Hypokalemia: Secondary | ICD-10-CM | POA: Diagnosis not present

## 2017-12-05 DIAGNOSIS — E1151 Type 2 diabetes mellitus with diabetic peripheral angiopathy without gangrene: Secondary | ICD-10-CM | POA: Diagnosis present

## 2017-12-05 DIAGNOSIS — R7309 Other abnormal glucose: Secondary | ICD-10-CM | POA: Diagnosis not present

## 2017-12-05 DIAGNOSIS — E1165 Type 2 diabetes mellitus with hyperglycemia: Secondary | ICD-10-CM | POA: Diagnosis not present

## 2017-12-05 DIAGNOSIS — R634 Abnormal weight loss: Secondary | ICD-10-CM | POA: Diagnosis not present

## 2017-12-05 DIAGNOSIS — I6300 Cerebral infarction due to thrombosis of unspecified precerebral artery: Secondary | ICD-10-CM | POA: Diagnosis not present

## 2017-12-05 DIAGNOSIS — N179 Acute kidney failure, unspecified: Secondary | ICD-10-CM | POA: Diagnosis not present

## 2017-12-05 DIAGNOSIS — E1159 Type 2 diabetes mellitus with other circulatory complications: Secondary | ICD-10-CM | POA: Diagnosis present

## 2017-12-05 DIAGNOSIS — I129 Hypertensive chronic kidney disease with stage 1 through stage 4 chronic kidney disease, or unspecified chronic kidney disease: Secondary | ICD-10-CM | POA: Diagnosis present

## 2017-12-05 DIAGNOSIS — E1142 Type 2 diabetes mellitus with diabetic polyneuropathy: Secondary | ICD-10-CM | POA: Diagnosis not present

## 2017-12-05 LAB — DIFFERENTIAL
BASOS ABS: 0 10*3/uL (ref 0.0–0.1)
BASOS PCT: 1 %
EOS ABS: 0.1 10*3/uL (ref 0.0–0.7)
Eosinophils Relative: 2 %
Lymphocytes Relative: 40 %
Lymphs Abs: 2.2 10*3/uL (ref 0.7–4.0)
Monocytes Absolute: 0.6 10*3/uL (ref 0.1–1.0)
Monocytes Relative: 10 %
NEUTROS PCT: 47 %
Neutro Abs: 2.6 10*3/uL (ref 1.7–7.7)

## 2017-12-05 LAB — CBC
HCT: 36.9 % (ref 36.0–46.0)
HCT: 35.1 % — ABNORMAL LOW (ref 36.0–46.0)
Hemoglobin: 12.4 g/dL (ref 12.0–15.0)
Hemoglobin: 11.9 g/dL — ABNORMAL LOW (ref 12.0–15.0)
MCH: 28 pg (ref 26.0–34.0)
MCH: 27.8 pg (ref 26.0–34.0)
MCHC: 33.6 g/dL (ref 30.0–36.0)
MCHC: 33.9 g/dL (ref 30.0–36.0)
MCV: 83.3 fL (ref 78.0–100.0)
MCV: 82 fL (ref 78.0–100.0)
PLATELETS: 393 10*3/uL (ref 150–400)
PLATELETS: 364 10*3/uL (ref 150–400)
RBC: 4.43 MIL/uL (ref 3.87–5.11)
RBC: 4.28 MIL/uL (ref 3.87–5.11)
RDW: 13.2 % (ref 11.5–15.5)
RDW: 13.2 % (ref 11.5–15.5)
WBC: 5.4 10*3/uL (ref 4.0–10.5)
WBC: 5.6 10*3/uL (ref 4.0–10.5)

## 2017-12-05 LAB — COMPREHENSIVE METABOLIC PANEL
ALBUMIN: 3.8 g/dL (ref 3.5–5.0)
ALK PHOS: 72 U/L (ref 38–126)
ALT: 10 U/L — ABNORMAL LOW (ref 14–54)
ANION GAP: 10 (ref 5–15)
AST: 18 U/L (ref 15–41)
BUN: 25 mg/dL — AB (ref 6–20)
CALCIUM: 9.6 mg/dL (ref 8.9–10.3)
CO2: 28 mmol/L (ref 22–32)
Chloride: 103 mmol/L (ref 101–111)
Creatinine, Ser: 1.09 mg/dL — ABNORMAL HIGH (ref 0.44–1.00)
GFR calc Af Amer: >60 mL/min (ref >60)
GFR calc non Af Amer: 56 mL/min — ABNORMAL LOW (ref >60)
GLUCOSE: 218 mg/dL — AB (ref 65–99)
POTASSIUM: 3.7 mmol/L (ref 3.5–5.1)
Sodium: 141 mmol/L (ref 135–145)
Total Bilirubin: 0.4 mg/dL (ref 0.3–1.2)
Total Protein: 7.6 g/dL (ref 6.5–8.1)

## 2017-12-05 LAB — CREATININE, SERUM: CREATININE: 0.86 mg/dL (ref 0.44–1.00)

## 2017-12-05 LAB — URINALYSIS, ROUTINE W REFLEX MICROSCOPIC
Bilirubin Urine: NEGATIVE
Glucose, UA: NEGATIVE mg/dL
Hgb urine dipstick: NEGATIVE
Ketones, ur: NEGATIVE mg/dL
Leukocytes, UA: NEGATIVE
NITRITE: NEGATIVE
Protein, ur: NEGATIVE mg/dL
SPECIFIC GRAVITY, URINE: 1.019 (ref 1.005–1.030)
pH: 7 (ref 5.0–8.0)

## 2017-12-05 LAB — I-STAT CHEM 8, ED
BUN: 25 mg/dL — ABNORMAL HIGH (ref 6–20)
CHLORIDE: 103 mmol/L (ref 101–111)
Calcium, Ion: 1.15 mmol/L (ref 1.15–1.40)
Creatinine, Ser: 1 mg/dL (ref 0.44–1.00)
Glucose, Bld: 215 mg/dL — ABNORMAL HIGH (ref 65–99)
HCT: 38 % (ref 36.0–46.0)
Hemoglobin: 12.9 g/dL (ref 12.0–15.0)
POTASSIUM: 3.7 mmol/L (ref 3.5–5.1)
Sodium: 141 mmol/L (ref 135–145)
TCO2: 26 mmol/L (ref 22–32)

## 2017-12-05 LAB — LIPID PANEL
CHOL/HDL RATIO: 3.2 ratio
CHOLESTEROL: 182 mg/dL (ref 0–200)
HDL: 57 mg/dL (ref >40)
LDL CALC: 100 mg/dL — AB (ref 0–99)
TRIGLYCERIDES: 125 mg/dL (ref <150)
VLDL: 25 mg/dL (ref 0–40)

## 2017-12-05 LAB — APTT: APTT: 34 s (ref 24–36)

## 2017-12-05 LAB — I-STAT TROPONIN, ED: Troponin i, poc: 0 ng/mL (ref 0.00–0.08)

## 2017-12-05 LAB — PROTIME-INR
INR: 1.04
Prothrombin Time: 13.5 seconds (ref 11.4–15.2)

## 2017-12-05 LAB — CBG MONITORING, ED: GLUCOSE-CAPILLARY: 144 mg/dL — AB (ref 65–99)

## 2017-12-05 MED ORDER — IOPAMIDOL (ISOVUE-370) INJECTION 76%
INTRAVENOUS | Status: AC
  Filled 2017-12-05: qty 50

## 2017-12-05 MED ORDER — ROSUVASTATIN CALCIUM 10 MG PO TABS: 10.0000 mg | ORAL_TABLET | Freq: Every day | ORAL | Status: DC

## 2017-12-05 MED ORDER — GLYBURIDE 2.5 MG PO TABS
2.5000 mg | ORAL_TABLET | Freq: Every day | ORAL | Status: DC
  Administered 2017-12-06: 2.5 mg via ORAL
  Filled 2017-12-05: qty 1

## 2017-12-05 MED ORDER — INSULIN ASPART 100 UNIT/ML ~~LOC~~ SOLN: 0.0000 [IU] | Freq: Three times a day (TID) | SUBCUTANEOUS | Status: DC

## 2017-12-05 MED ORDER — SENNOSIDES-DOCUSATE SODIUM 8.6-50 MG PO TABS: 1.0000 | ORAL_TABLET | Freq: Every evening | ORAL | Status: DC | PRN

## 2017-12-05 MED ORDER — ASPIRIN 81 MG PO CHEW
243.0000 mg | CHEWABLE_TABLET | Freq: Once | ORAL | Status: AC
  Administered 2017-12-05: 243 mg via ORAL
  Filled 2017-12-05: qty 3

## 2017-12-05 MED ORDER — AMLODIPINE BESYLATE 5 MG PO TABS
5.0000 mg | ORAL_TABLET | Freq: Every day | ORAL | Status: DC
  Administered 2017-12-05 – 2017-12-06 (×2): 5 mg via ORAL
  Filled 2017-12-05 (×2): qty 1

## 2017-12-05 MED ORDER — ROSUVASTATIN CALCIUM 20 MG PO TABS
40.0000 mg | ORAL_TABLET | Freq: Every day | ORAL | Status: DC
  Administered 2017-12-07 – 2017-12-08 (×2): 40 mg via ORAL
  Filled 2017-12-05 (×2): qty 2

## 2017-12-05 MED ORDER — INSULIN GLARGINE 100 UNIT/ML ~~LOC~~ SOLN
10.0000 [IU] | Freq: Every day | SUBCUTANEOUS | Status: DC
  Administered 2017-12-05: 10 [IU] via SUBCUTANEOUS
  Filled 2017-12-05 (×3): qty 0.1

## 2017-12-05 MED ORDER — ENOXAPARIN SODIUM 40 MG/0.4ML ~~LOC~~ SOLN
40.0000 mg | SUBCUTANEOUS | Status: DC
  Administered 2017-12-05 – 2017-12-08 (×4): 40 mg via SUBCUTANEOUS
  Filled 2017-12-05 (×4): qty 0.4

## 2017-12-05 MED ORDER — NICOTINE 21 MG/24HR TD PT24
21.0000 mg | MEDICATED_PATCH | Freq: Every day | TRANSDERMAL | Status: DC
  Filled 2017-12-05 (×3): qty 1

## 2017-12-05 MED ORDER — ALBUTEROL SULFATE (2.5 MG/3ML) 0.083% IN NEBU: 2.5000 mg | INHALATION_SOLUTION | Freq: Four times a day (QID) | RESPIRATORY_TRACT | Status: DC | PRN

## 2017-12-05 MED ORDER — ASPIRIN EC 81 MG PO TBEC: 81.0000 mg | DELAYED_RELEASE_TABLET | Freq: Every day | ORAL | Status: DC

## 2017-12-05 MED ORDER — HYDROCHLOROTHIAZIDE 25 MG PO TABS
25.0000 mg | ORAL_TABLET | Freq: Every day | ORAL | Status: DC
  Administered 2017-12-06: 25 mg via ORAL
  Filled 2017-12-05: qty 1

## 2017-12-05 MED ORDER — IOPAMIDOL (ISOVUE-370) INJECTION 76%
50.0000 mL | Freq: Once | INTRAVENOUS | Status: AC | PRN
  Administered 2017-12-05: 50 mL via INTRAVENOUS

## 2017-12-05 MED ORDER — STROKE: EARLY STAGES OF RECOVERY BOOK
Freq: Once | Status: AC
  Administered 2017-12-05: 22:00:00
  Filled 2017-12-05: qty 1

## 2017-12-05 MED ORDER — SODIUM CHLORIDE 0.9 % IV BOLUS
1000.0000 mL | Freq: Once | INTRAVENOUS | Status: AC
  Administered 2017-12-05: 1000 mL via INTRAVENOUS

## 2017-12-05 MED ORDER — ALBUTEROL SULFATE HFA 108 (90 BASE) MCG/ACT IN AERS: 2.0000 | INHALATION_SPRAY | Freq: Four times a day (QID) | RESPIRATORY_TRACT | Status: DC | PRN

## 2017-12-05 MED ORDER — SODIUM CHLORIDE 0.9 % IV SOLN
INTRAVENOUS | Status: DC
  Administered 2017-12-05 – 2017-12-08 (×5): via INTRAVENOUS

## 2017-12-05 MED ORDER — LISINOPRIL 20 MG PO TABS
40.0000 mg | ORAL_TABLET | Freq: Every day | ORAL | Status: DC
  Administered 2017-12-05 – 2017-12-06 (×2): 40 mg via ORAL
  Filled 2017-12-05: qty 2
  Filled 2017-12-05: qty 1

## 2017-12-05 MED ORDER — UMECLIDINIUM BROMIDE 62.5 MCG/INH IN AEPB
1.0000 | INHALATION_SPRAY | Freq: Every day | RESPIRATORY_TRACT | Status: DC
  Administered 2017-12-07 – 2017-12-09 (×3): 1 via RESPIRATORY_TRACT
  Filled 2017-12-05 (×2): qty 7

## 2017-12-05 MED ORDER — ACETAMINOPHEN 160 MG/5ML PO SOLN: 650.0000 mg | ORAL | Status: DC | PRN

## 2017-12-05 MED ORDER — ACETAMINOPHEN 650 MG RE SUPP: 650.0000 mg | RECTAL | Status: DC | PRN

## 2017-12-05 MED ORDER — ASPIRIN EC 325 MG PO TBEC
325.0000 mg | DELAYED_RELEASE_TABLET | Freq: Every day | ORAL | Status: DC
  Administered 2017-12-06 – 2017-12-09 (×4): 325 mg via ORAL
  Filled 2017-12-05 (×4): qty 1

## 2017-12-05 MED ORDER — ACETAMINOPHEN 325 MG PO TABS
650.0000 mg | ORAL_TABLET | ORAL | Status: DC | PRN
  Administered 2017-12-08: 650 mg via ORAL
  Filled 2017-12-05: qty 2

## 2017-12-05 NOTE — ED Triage Notes (Signed)
Patient with complaint of dizziness and difficulty walking that started when she woke at 0700 this morning. Reports she was last normal when she went to bed at 2300 last night. History of stroke with left sided weakness. Left sided weakness present today, patient states she thinks it is a little more weak than normal. Denies vision changes.

## 2017-12-05 NOTE — ED Notes (Addendum)
HusbandFlorentina Jenny Phone- [phone]

## 2017-12-05 NOTE — ED Notes (Signed)
Returned from XRAY

## 2017-12-05 NOTE — H&P (Signed)
History and Physical    Kelly Gibson TIW:[id number] DOB: 11/12/1960 DOA: 12/05/2017  Referring MD/NP/PA: Charlena Cross, ER  PCP: Alycia Rossetti, MD   Outpatient Specialists: Golden Hurter, Cardiology  Patient coming from: home  Chief Complaint: weakness and dizziness  HPI: Kelly Gibson is a 57 y.o. female with medical history significant of diabetes, hypertension, hyperlipidemia, COPD as well as recent CVA in March of this ear that left her with residual right sided weakness. Patient came to the ER today complaining of dizziness. She woke up this morning and felt weak n the same side that felt like her previous CVA. She also felt dizzy. Patient said she was able to do all her ADLs prior to these.She was improving from her previous stroke in March. She has a loop recorder in place which showed about 2 seconds pauses last night. In the ER patient was evaluated and found to have a new infarct. She's only been evaluated by neurology and we are admitting her for further workup.  ED Course:  Vitals were stable with a temp of 98 blood pressure 137/75, respiratory of 21 and sats 100% on room air. Head CT without contrast showed no acute findings subsequent MRI of the brain showed small acute lacunar infarct along the left frontal horn periventricular white matter. Also subacute lacunar infarct in the left posterior corona radiata. Neurology was consulted and patient had CT MG of the neck ordered and being admitted for treatment.  Review of Systems: As per HPI otherwise 10 point review of systems negative.    Past Medical History:  Diagnosis Date  . COPD (chronic obstructive pulmonary disease) ([place])   . Depression   . Diabetes mellitus   . Hyperlipidemia   . Hypertension     Past Surgical History:  Procedure Laterality Date  . CESAREAN SECTION     x 3  . TUMOR REMOVAL     left shoulder     reports that she has quit smoking. She has never used smokeless tobacco. She reports  that she does not drink alcohol or use drugs.  Allergies  Allergen Reactions  . Niacin And Related Rash    Family History  Problem Relation Age of Onset  . Diabetes Mother   . Heart disease Father   . Cancer Father     Prior to Admission medications   Medication Sig Start Date End Date Taking? Authorizing Provider  albuterol (PROVENTIL HFA;VENTOLIN HFA) 108 (90 Base) MCG/ACT inhaler Inhale 2 puffs into the lungs every 6 (six) hours as needed for wheezing or shortness of breath. 10/18/17  Yes [place], Modena Nunnery, MD  albuterol (PROVENTIL) (2.5 MG/3ML) 0.083% nebulizer solution Take 3 mLs (2.5 mg total) by nebulization every 6 (six) hours as needed for wheezing or shortness of breath. 08/16/16  Yes Everlene Balls, MD  amLODipine (NORVASC) 5 MG tablet Take 1 tablet (5 mg total) by mouth daily. 10/28/17  Yes Patrecia Pour, MD  aspirin EC 81 MG tablet Take 81 mg by mouth daily.   Yes [provider]  glyBURIDE (DIABETA) 2.5 MG tablet One tab daily 05/15/16  Yes [place], Modena Nunnery, MD  hydrochlorothiazide (HYDRODIURIL) 25 MG tablet Take 1 tablet (25 mg total) by mouth daily. 10/28/17  Yes [place], Modena Nunnery, MD  Ibuprofen-diphenhydrAMINE Cit (ADVIL PM PO) Take 1 tablet by mouth as needed (for sleep).   Yes [provider]  Insulin Glargine (TOUJEO SOLOSTAR) 300 UNIT/ML SOPN Inject 10 Units into the skin at bedtime. 10/27/17  Yes Patrecia Pour, MD  Insulin Pen Needle (BD PEN NEEDLE MICRO U/F) 32G X 6 MM MISC Use as directed to inject Toujeo SQ QD. Patient taking differently: No sig reported 10/28/17  Yes [place], Modena Nunnery, MD  lisinopril (PRINIVIL,ZESTRIL) 40 MG tablet Take 1 tablet (40 mg total) by mouth daily. 10/27/17  Yes Patrecia Pour, MD  rosuvastatin (CRESTOR) 10 MG tablet Take 1 tablet (10 mg total) by mouth daily. 10/18/17  Yes [place], Modena Nunnery, MD  umeclidinium bromide (INCRUSE ELLIPTA) 62.5 MCG/INH AEPB Inhale 1 puff into the lungs daily. 10/18/17  Yes [place], Modena Nunnery, MD  Glucose  Blood (BLOOD GLUCOSE TEST STRIPS) STRP Please dispense per patient and insurance preference. Use as directed to monitor FSBS  3x daily. Dx: E11.65. 10/28/17   Alycia Rossetti, MD  Lancet Devices MISC Please dispense per patient and insurance preference. Use as directed to monitor FSBS  3x daily. Dx: E11.65. 10/18/17   Alycia Rossetti, MD  potassium chloride SA (K-DUR,KLOR-CON) 20 MEQ tablet Take 1 tablet (20 mEq total) by mouth daily. Patient not taking: Reported on 11/03/2017 10/18/17   Alycia Rossetti, MD    Physical Exam: Vitals:   12/05/17 1446 12/05/17 1522 12/05/17 1530 12/05/17 1600  BP:  (!) 166/74 (!) 175/83 130/75  Pulse:  (!) 52 82 62  Resp:  15 (!) 23 (!) 21  Temp: 98 F (36.7 C)     SpO2:  100% 100% 100%      Constitutional: NAD, calm, comfortable Vitals:   12/05/17 1446 12/05/17 1522 12/05/17 1530 12/05/17 1600  BP:  (!) 166/74 (!) 175/83 130/75  Pulse:  (!) 52 82 62  Resp:  15 (!) 23 (!) 21  Temp: 98 F (36.7 C)     SpO2:  100% 100% 100%   Eyes: PERRL, lids and conjunctivae normal ENMT: Mucous membranes are moist. Posterior pharynx clear of any exudate or lesions.Normal dentition.  Neck: normal, supple, no masses, no thyromegaly Respiratory: clear to auscultation bilaterally, no wheezing, no crackles. Normal respiratory effort. No accessory muscle use.  Cardiovascular: Regular rate and rhythm, no murmurs / rubs / gallops. No extremity edema. 2+ pedal pulses. No carotid bruits.  Abdomen: no tenderness, no masses palpated. No hepatosplenomegaly. Bowel sounds positive.  Musculoskeletal: no clubbing / cyanosis. No joint deformity upper and lower extremities. Good ROM, no contractures. Normal muscle tone.  Skin: no rashes, lesions, ulcers. No induration Neurologic: CN 2-12 grossly intact. Sensation intact, DTR normal. Strength right lower extremity power is 2 out of 5, right upper extremity 1 out of 5, mild dysarthria otherwise power is 5 out of 5 on the limbs and  reflexes 2+ in all limbs Psychiatric: Normal judgment and insight. Alert and oriented x 3. Normal mood.   Labs on Admission: I have personally reviewed following labs and imaging studies  CBC: Recent Labs  Lab 12/05/17 1244 12/05/17 1251  WBC 5.4  --   NEUTROABS 2.6  --   HGB 12.4 12.9  HCT 36.9 38.0  MCV 83.3  --   PLT 393  --    Basic Metabolic Panel: Recent Labs  Lab 12/05/17 1244 12/05/17 1251  NA 141 141  K 3.7 3.7  CL 103 103  CO2 28  --   GLUCOSE 218* 215*  BUN 25* 25*  CREATININE 1.09* 1.00  CALCIUM 9.6  --    GFR: CrCl cannot be calculated (Unknown ideal weight.). Liver Function Tests: Recent Labs  Lab 12/05/17 1244  AST 18  ALT 10*  ALKPHOS 72  BILITOT 0.4  PROT 7.6  ALBUMIN 3.8   No results for input(s): LIPASE, AMYLASE in the last 168 hours. No results for input(s): AMMONIA in the last 168 hours. Coagulation Profile: Recent Labs  Lab 12/05/17 1244  INR 1.04   Cardiac Enzymes: No results for input(s): CKTOTAL, CKMB, CKMBINDEX, TROPONINI in the last 168 hours. BNP (last 3 results) No results for input(s): PROBNP in the last 8760 hours. HbA1C: No results for input(s): HGBA1C in the last 72 hours. CBG: No results for input(s): GLUCAP in the last 168 hours. Lipid Profile: No results for input(s): CHOL, HDL, LDLCALC, TRIG, CHOLHDL, LDLDIRECT in the last 72 hours. Thyroid Function Tests: No results for input(s): TSH, T4TOTAL, FREET4, T3FREE, THYROIDAB in the last 72 hours. Anemia Panel: No results for input(s): VITAMINB12, FOLATE, FERRITIN, TIBC, IRON, RETICCTPCT in the last 72 hours. Urine analysis:    Component Value Date/Time   COLORURINE AMBER (A) 10/27/2017 0633   APPEARANCEUR CLOUDY (A) 10/27/2017 0633   LABSPEC 1.023 10/27/2017 0633   PHURINE 6.0 10/27/2017 0633   GLUCOSEU NEGATIVE 10/27/2017 0633   HGBUR SMALL (A) 10/27/2017 0633   BILIRUBINUR NEGATIVE 10/27/2017 0633   KETONESUR NEGATIVE 10/27/2017 0633   PROTEINUR 30 (A)  10/27/2017 0633   UROBILINOGEN 0.2 01/26/2007 1410   NITRITE NEGATIVE 10/27/2017 0633   LEUKOCYTESUR LARGE (A) 10/27/2017 0633   Sepsis Labs: @LABRCNTIP (procalcitonin:4,lacticidven:4) )No results found for this or any previous visit (from the past 240 hour(s)).   Radiological Exams on Admission: Ct Head Wo Contrast  Result Date: 12/05/2017 CLINICAL DATA:  Dizziness and difficulty walking beginning this morning. Normal last night. EXAM: CT HEAD WITHOUT CONTRAST TECHNIQUE: Contiguous axial images were obtained from the base of the skull through the vertex without intravenous contrast. COMPARISON:  CT and MRI 10/26/2017 FINDINGS: Brain: Generalized atrophy. Chronic small-vessel changes affecting the pons. Old small vessel infarctions affecting the thalami and basal ganglia. Chronic small-vessel ischemic changes of the white matter. No sign of acute infarction, mass lesion, hemorrhage, hydrocephalus or extra-axial collection. Vascular: There is atherosclerotic calcification of the major vessels at the base of the brain. Skull: Negative Sinuses/Orbits: Clear/normal Other: None IMPRESSION: No acute finding by CT. Extensive old ischemic changes throughout the brain as outlined above. Electronically Signed   By: Nelson Chimes M.D.   On: 12/05/2017 13:03   Mr Brain Wo Contrast  Result Date: 12/05/2017 CLINICAL DATA:  57 year old female with dizziness, difficulty walking beginning this morning. EXAM: MRI HEAD WITHOUT CONTRAST TECHNIQUE: Multiplanar, multiecho pulse sequences of the brain and surrounding structures were obtained without intravenous contrast. COMPARISON:  Head CT without contrast 1250 hours today. Brain MRI 10/26/2017, and earlier. FINDINGS: Brain: There are 2 small areas of abnormal diffusion in the left frontal lobe. A nodular focus of restriction in the left periventricular white matter along the anterior left frontal horn is noted on series 501, image 58 (ADC map image 22). A patchy  curvilinear area in the left corona radiata appears only partially restricted (image 57). Both there is demonstrate T2 and FLAIR hyperintensity. No associated hemorrhage or mass effect. No other No restricted diffusion or evidence of acute infarction. Abnormal T2 and FLAIR signal in the pons, throughout the bilateral deep gray matter nuclei, and cerebral white matter most resembling advanced chronic small vessel ischemia. Focal involvement of the deep white matter capsules, and also the anterior body of the corpus callosum which is abnormally thinned (series 7001, image 12), and mild  chronic blood products also noted at this site (series 11001, image 40). Chronic hemorrhage also in the left deep gray matter nuclei and lower brainstem (image 15). The cerebellar hemispheres appear spared. No cerebral cortical encephalomalacia is identified. No midline shift, mass effect, evidence of mass lesion, ventriculomegaly, extra-axial collection or acute intracranial hemorrhage. Cervicomedullary junction and pituitary are within normal limits. Vascular: Major intracranial vascular flow voids are stable. Skull and upper cervical spine: Negative visible cervical spine. Visualized bone marrow signal is within normal limits. Sinuses/Orbits: Stable and negative. Other: Mastoid air cells remain clear. Grossly normal visible internal auditory structures. Negative scalp and face soft tissues. IMPRESSION: 1. Small acute lacunar infarct along the left frontal horn periventricular white matter. Small subacute lacunar infarct in the left posterior corona radiata. No acute hemorrhage or mass effect. 2. Underlying very advanced chronic small vessel disease redemonstrated. Numerous chronic lacunar infarcts in the deep gray matter nuclei, brainstem, and cerebral white matter including the corpus callosum. Electronically Signed   By: Genevie Ann M.D.   On: 12/05/2017 17:34    EKG: Independently reviewed. Normal sinus rhythm with multiple atrial  complexes.Flattened T waves in the lateral lead which is unchanged from previous  Assessment/Plan Principal Problem:   Cerebrovascular disease Active Problems:   Diabetes mellitus type II, uncontrolled ([place])   Essential hypertension, benign   Tobacco user   Uncontrolled hypertension   Acute CVA (cerebrovascular accident) ([place])    #1 acute/Subacute CVA: patient is on aspirin and statin already. It appears she's had recurrent CVA. Most likely has arrhythmias. Patient already seen by Neurology. Will check hemoglobin A1c and fasting lipid panel. Patient has been on only a 2 mg of aspirin. She will need a full dose aspirin. Consider adding Plavix. PT and OT evaluation to determine disposition.  #2 diabetes: Patient is insulin-dependent. Continue home regimen. Add sliding scale insulin  #3 hypertension: Permissive hypertension CVA. Resume home regimen.  #4 tobacco abuse: counseling provided. Nicotine patch to be offered  #5  Dizziness: Most likely secondary to arrhythmias. Patient to be followed by cardiology.  #6 residual CVA: Will need PT and OT extensively.    DVT prophylaxis: Lovenox  Code Status: Full Family Communication: Husband at bedside  Disposition Plan: To be determined  Consults called: Neurology, Dr Patrick North, Please consult cardiology/EP in am  Admission status: inpatient   Severity of Illness: The appropriate patient status for this patient is INPATIENT. Inpatient status is judged to be reasonable and necessary in order to provide the required intensity of service to ensure the patient's safety. The patient's presenting symptoms, physical exam findings, and initial radiographic and laboratory data in the context of their chronic comorbidities is felt to place them at high risk for further clinical deterioration. Furthermore, it is not anticipated that the patient will be medically stable for discharge from the hospital within 2 midnights of admission. The following factors  support the patient status of inpatient.   " The patient's presenting symptoms include dizziness and right-sided weakness. " The worrisome physical exam findings include residual right-sided weakness on exam. " The initial radiographic and laboratory data are worrisome because of evidence of acute CVA involving left ACA and MCA. " The chronic co-morbidities include previous CVA, diabetes and hypertension.   * I certify that at the point of admission it is my clinical judgment that the patient will require inpatient hospital care spanning beyond 2 midnights from the point of admission due to high intensity of service, high risk for further deterioration  and high frequency of surveillance required.Barbette Merino MD [facility] Pager [phone]  If 7PM-7AM, please contact night-coverage [email or web address] Password [facility]  12/05/2017, 6:30 PM

## 2017-12-05 NOTE — ED Notes (Signed)
Patient transported to MRI 

## 2017-12-05 NOTE — Consult Note (Addendum)
Referring Physician: ER    Chief Complaint: "Feel like I'm having my stroke all over again"  HPI: Kelly Gibson is an 57 y.o. female who presents to the ER today after waking up and feeling dizzy. She tells me she had a stroke in March, this left her with with mild right side weakness and numbness. She says these symptoms have gradually improved until this am, when they seemed to be the "same as in March" all over again. At her new post stroke baseline she has mild debility with returning to home life, but tells me she is able to do all her own ADLs (mRS 2). She has additional PMH of COPD, depression, DM2, HLD, HTN. Of note, she has had a LINQ monitor placed and there is a note from [facility] that they recorded SB w/3-4 sec pauses overnight in early morning hrs this am. The patient's husband states that he was told her HR went down into the 30's last night. No full report available at this time. This may account for her dizziness.   Date last known well: 5/11 Time last known well: ~2200  tPA Given: No, out of time window.  Past Medical History Past Medical History:  Diagnosis Date  . COPD (chronic obstructive pulmonary disease) ([place])   . Depression   . Diabetes mellitus   . Hyperlipidemia   . Hypertension   Strokes  Surgical History Past Surgical History:  Procedure Laterality Date  . CESAREAN SECTION     x 3  . TUMOR REMOVAL     left shoulder    Family History  Family History  Problem Relation Age of Onset  . Diabetes Mother   . Heart disease Father   . Cancer Father     Social History:   reports that she has quit smoking. She has never used smokeless tobacco. She reports that she does not drink alcohol or use drugs.  Allergies:  Allergies  Allergen Reactions  . Niacin And Related Rash    Home Medications:   (Not in a hospital admission) Current Meds  Medication Sig  . albuterol (PROVENTIL HFA;VENTOLIN HFA) 108 (90 Base) MCG/ACT inhaler Inhale 2 puffs into the  lungs every 6 (six) hours as needed for wheezing or shortness of breath.  Marland Kitchen albuterol (PROVENTIL) (2.5 MG/3ML) 0.083% nebulizer solution Take 3 mLs (2.5 mg total) by nebulization every 6 (six) hours as needed for wheezing or shortness of breath.  Marland Kitchen amLODipine (NORVASC) 5 MG tablet Take 1 tablet (5 mg total) by mouth daily.  Marland Kitchen aspirin EC 81 MG tablet Take 81 mg by mouth daily.  Marland Kitchen glyBURIDE (DIABETA) 2.5 MG tablet One tab daily  . hydrochlorothiazide (HYDRODIURIL) 25 MG tablet Take 1 tablet (25 mg total) by mouth daily.  . Ibuprofen-diphenhydrAMINE Cit (ADVIL PM PO) Take 1 tablet by mouth as needed (for sleep).  . Insulin Glargine (TOUJEO SOLOSTAR) 300 UNIT/ML SOPN Inject 10 Units into the skin at bedtime.  . Insulin Pen Needle (BD PEN NEEDLE MICRO U/F) 32G X 6 MM MISC Use as directed to inject Toujeo SQ QD. (Patient taking differently: No sig reported)  . lisinopril (PRINIVIL,ZESTRIL) 40 MG tablet Take 1 tablet (40 mg total) by mouth daily.  . rosuvastatin (CRESTOR) 10 MG tablet Take 1 tablet (10 mg total) by mouth daily.  Marland Kitchen umeclidinium bromide (INCRUSE ELLIPTA) 62.5 MCG/INH AEPB Inhale 1 puff into the lungs daily.    ROS: History obtained from pt  General ROS: gen fatigue and dizziness this am. Negative  for - chills, fever, night sweats, weight gain or weight loss Psychological ROS: negative for - behavioral disorder, hallucinations, memory difficulties, mood swings or suicidal ideation Ophthalmic ROS: negative for - blurry vision, double vision, eye pain or loss of vision ENT ROS: negative for - epistaxis, nasal discharge, oral lesions, sore throat, tinnitus or vertigo Allergy and Immunology ROS: negative for - hives or itchy/watery eyes Hematological and Lymphatic ROS: negative for - bleeding problems, bruising or swollen lymph nodes Endocrine ROS: negative for - galactorrhea, hair pattern changes, polydipsia/polyuria or temperature intolerance Respiratory ROS: negative for - cough,  hemoptysis, shortness of breath or wheezing Cardiovascular ROS: negative for - chest pain, dyspnea on exertion, edema or irregular heartbeat Gastrointestinal ROS: negative for - abdominal pain, diarrhea, hematemesis, nausea/vomiting or stool incontinence Genito-Urinary ROS: negative for - dysuria, hematuria, incontinence or urinary frequency/urgency Musculoskeletal ROS: negative for - joint swelling or muscular weakness Neurological ROS: as noted in HPI Dermatological ROS: negative for rash and skin lesion changes   Physical Examination: Vitals:   12/05/17 1446 12/05/17 1522 12/05/17 1530 12/05/17 1600  BP:  (!) 166/74 (!) 175/83 130/75  Pulse:  (!) 52 82 62  Resp:  15 (!) 23 (!) 21  Temp: 98 F (36.7 C)     SpO2:  100% 100% 100%    General - calm, no distress, well nourished, looks older than stated age. Heart - Regular rate and rhythm, slow pulses - no murmer appreciated Lungs - Clear to auscultation , no sob Abdomen - Soft - non tender Extremities - Distal pulses intact - no edema Skin - Warm and dry   Neurologic Examination: Mental Status: Alert, oriented, thought content appropriate. Speech is mildly dysarthric, but without evidence of aphasia. No anomia. Able to follow 3 step commands without difficulty. Cranial Nerves: II: Discs not visualized; Visual fields grossly normal, pupils equal, round, reactive to light III,IV, VI: ptosis not present, extra-ocular motions intact bilaterally V,VII: smile symmetric, reports decreased rt facial sensation to light touch. Normal on left VIII: hearing normal bilaterally IX,X: gag reflex present XI: bilateral shoulder shrug XII: midline tongue extension Motor: RUE - 4/5, drift    LUE - 5/5   RLE - 3/5, drift s to bed before end of count   LLE - 5/5 Tone and bulk:normal tone throughout; no atrophy noted Sensory: Light touch intact on left, right side is reported as "different" Deep Tendon Reflexes: 2+ and symmetric  throughout Plantars: Right: downgoing  Left: downgoing Cerebellar: normal finger-to-nose and normal heel-to-shin test Gait: deferred at this time.   NIHSS 1a Level of Conscious:0 1b LOC Questions: 0 1c LOC Commands:0  2 Best Gaze: 0 3 Visual: 0 4 Facial Palsy:0  5a Motor Arm - left:0  5b Motor Arm - Right:1  6a Motor Leg - Left: 0 6b Motor Leg - Right: 2 7 Limb Ataxia: 0, not out of proportion to weakness 8 Sensory: 1 9 Best Language: 0 10 Dysarthria:1 11 Extinct. and Inattention:0 TOTAL: 5   LABORATORY STUDIES:  Basic Metabolic Panel: Recent Labs  Lab 12/05/17 1244 12/05/17 1251  NA 141 141  K 3.7 3.7  CL 103 103  CO2 28  --   GLUCOSE 218* 215*  BUN 25* 25*  CREATININE 1.09* 1.00  CALCIUM 9.6  --     Liver Function Tests: Recent Labs  Lab 12/05/17 1244  AST 18  ALT 10*  ALKPHOS 72  BILITOT 0.4  PROT 7.6  ALBUMIN 3.8   CBC: Recent Labs  Lab 12/05/17 1244 12/05/17 1251  WBC 5.4  --   NEUTROABS 2.6  --   HGB 12.4 12.9  HCT 36.9 38.0  MCV 83.3  --   PLT 393  --     Coagulation Studies: Recent Labs    12/05/17 1244  LABPROT 13.5  INR 1.04    Lipid Panel:    HgbA1C:  Lab Results  Component Value Date   HGBA1C 12.2 (H) 10/18/2017    Urine Drug Screen:     Component Value Date/Time   LABOPIA NONE DETECTED 10/27/2017 0633   COCAINSCRNUR NONE DETECTED 10/27/2017 0633   COCAINSCRNUR NEGATIVE 01/24/2008 1044   LABBENZ NONE DETECTED 10/27/2017 0633   LABBENZ NEGATIVE 01/24/2008 1044   AMPHETMU NONE DETECTED 10/27/2017 0633   THCU NONE DETECTED 10/27/2017 0633   LABBARB NONE DETECTED 10/27/2017 0633     Alcohol Level:  No results for input(s): ETH in the last 168 hours.  Miscellaneous labs:  EKG  EKG   IMAGING: Ct Head Wo Contrast  Result Date: 12/05/2017 CLINICAL DATA:  Dizziness and difficulty walking beginning this morning. Normal last night. EXAM: CT HEAD WITHOUT CONTRAST TECHNIQUE: Contiguous axial images were  obtained from the base of the skull through the vertex without intravenous contrast. COMPARISON:  CT and MRI 10/26/2017 FINDINGS: Brain: Generalized atrophy. Chronic small-vessel changes affecting the pons. Old small vessel infarctions affecting the thalami and basal ganglia. Chronic small-vessel ischemic changes of the white matter. No sign of acute infarction, mass lesion, hemorrhage, hydrocephalus or extra-axial collection. Vascular: There is atherosclerotic calcification of the major vessels at the base of the brain. Skull: Negative Sinuses/Orbits: Clear/normal Other: None IMPRESSION: No acute finding by CT. Extensive old ischemic changes throughout the brain as outlined above. Electronically Signed   By: Nelson Chimes M.D.   On: 12/05/2017 13:03   MRI brain: 1. Small acute lacunar infarct along the left frontal horn periventricular white matter. Small subacute lacunar infarct in the left posterior corona radiata. No acute hemorrhage or mass effect. 2. Underlying very advanced chronic small vessel disease redemonstrated. Numerous chronic lacunar infarcts in the deep gray matter nuclei, brainstem, and cerebral white matter including the corpus callosum.   Assessment: 57 y.o. female who awoke with worsening right sided stroke symptoms. Stroke Risk Factors - prev strokes, chronic small vessel disease, HTN, DM2  # Acute/Subacute Ischemic strokes- there are two new strokes on MRI, one acute and one subacute. The subacute infarction, within the left posterior corona radiata, is at the expected location given the patient's right sided sensory and motor deficits. Additionally, there are some microhemorrhages seen on the hem sequence, likely secondary to HTN or early amyloid angiopathy.  # h/o stroke in March 2019- records requested # Cerebral small vessel disease, advanced for pt age- max med mgt. CTA h/n to better visualize # DM2 w/hyperglycemia- A1c pending, max control # HTN- permissive HTN to SBP  220 for next 24h # Dizziness- dont think this is r/t strokes, possibly d/t arrhythmia or pauses etc, need to evaluate the [employer].  # LINQ- placed recently for unknown reason per pt. Current report shows pauses and SB # Classifiable as having failed ASA monotherapy at 81 mg po qd. Will increase to 325 mg. Stroke team to assess CTA to determine if adding Plavix for DAPT would be a better option. DDx regarding etiology includes small vessel disease and cardioembolic strokes, given the two separate vascular distributions of the two new strokes (left ACA and MCA).  Recommendations:  HgbA1c, fasting lipid panel  CTA head/neck  PT consult, OT consult, Speech consult  Echocardiogram  Prophylactic therapy  Statin Therapy - Increase rosuvastatin to 40 mg po qd  Risk factor modification  Telemetry monitoring  Frequent neuro checks  Fall Precautions  Request records from March from [facility] in [place] monitoring since implantation from [employer]  Increase ASA to 325 mg po qd. Accounting for the small number of microhemorrhages on MRI, benefits of increased dosage are felt to outweigh risks   I have seen and examined the patient. I have amended the assessment and recommendations above.  Electronically signed: Dr. Kerney Elbe

## 2017-12-05 NOTE — ED Notes (Signed)
Pt urinated in bed

## 2017-12-05 NOTE — ED Provider Notes (Signed)
[facility] EMERGENCY DEPARTMENT Provider Note   CSN: [id number] Arrival date & time: 12/05/17  1216     History   Chief Complaint Chief Complaint  Patient presents with  . Weakness    HPI Kelly Gibson is a 57 y.o. female.  HPI Patient is a 57 year old female with history as below, notable for diabetes, hypertension, hyperlipidemia, COPD, and report of prior stroke, who presents with right-sided weakness and slurred speech.  She noticed the symptoms when she awoke this morning.  She is here with her husband.  He reports that she had similar symptoms in January while they were in [place] and then again in March here.  She had studies performed and was told that she had old strokes but nothing new at that time.  She has had some residual right-sided weakness since her stroke in March, however it had almost resolved.  This morning, she has been having difficulty walking and had 2 falls.  She reports that she did not injure herself when she fell.  She did not hit her head and did not lose consciousness.  She endorses right-sided numbness as well.  She has had no vision changes.  She is not dizzy at rest only when she gets up.  She denies any recent infectious symptoms such as chest pain, cough, fever, shortness of breath, dysuria.  She was in her normal state of health yesterday and was able to walk without assistance.  Past Medical History:  Diagnosis Date  . COPD (chronic obstructive pulmonary disease) ([place])   . Depression   . Diabetes mellitus   . Hyperlipidemia   . Hypertension     Patient Active Problem List   Diagnosis Date Noted  . Acute CVA (cerebrovascular accident) ([place]) 12/05/2017  . Bradycardia   . Uncontrolled hypertension   . Weakness 10/26/2017  . Cerebrovascular disease 10/18/2017  . COPD (chronic obstructive pulmonary disease) ([place]) 02/18/2015  . Loss of weight 03/15/2014  . Non compliance w medication regimen 06/17/2013  . Post-menopause  bleeding 06/16/2013  . Bronchitis 05/04/2012  . Tobacco user 05/04/2012  . Ganglion cyst 03/09/2012  . Anxiety 03/09/2012  . Essential hypertension, benign 12/17/2011  . Obesity, unspecified 12/17/2011  . Hyperlipidemia 12/17/2011  . Depression 12/17/2011  . Diabetes mellitus type II, uncontrolled ([place]) 12/14/2011    Past Surgical History:  Procedure Laterality Date  . CESAREAN SECTION     x 3  . TUMOR REMOVAL     left shoulder     OB History   None      Home Medications    Prior to Admission medications   Medication Sig Start Date End Date Taking? Authorizing Provider  albuterol (PROVENTIL HFA;VENTOLIN HFA) 108 (90 Base) MCG/ACT inhaler Inhale 2 puffs into the lungs every 6 (six) hours as needed for wheezing or shortness of breath. 10/18/17  Yes [place], Modena Nunnery, MD  albuterol (PROVENTIL) (2.5 MG/3ML) 0.083% nebulizer solution Take 3 mLs (2.5 mg total) by nebulization every 6 (six) hours as needed for wheezing or shortness of breath. 08/16/16  Yes Everlene Balls, MD  amLODipine (NORVASC) 5 MG tablet Take 1 tablet (5 mg total) by mouth daily. 10/28/17  Yes Patrecia Pour, MD  aspirin EC 81 MG tablet Take 81 mg by mouth daily.   Yes [provider]  glyBURIDE (DIABETA) 2.5 MG tablet One tab daily 05/15/16  Yes [place], Modena Nunnery, MD  hydrochlorothiazide (HYDRODIURIL) 25 MG tablet Take 1 tablet (25 mg total) by mouth  daily. 10/28/17  Yes [place], Modena Nunnery, MD  Ibuprofen-diphenhydrAMINE Cit (ADVIL PM PO) Take 1 tablet by mouth as needed (for sleep).   Yes [provider]  Insulin Glargine (TOUJEO SOLOSTAR) 300 UNIT/ML SOPN Inject 10 Units into the skin at bedtime. 10/27/17  Yes Patrecia Pour, MD  Insulin Pen Needle (BD PEN NEEDLE MICRO U/F) 32G X 6 MM MISC Use as directed to inject Toujeo SQ QD. Patient taking differently: No sig reported 10/28/17  Yes [place], Modena Nunnery, MD  lisinopril (PRINIVIL,ZESTRIL) 40 MG tablet Take 1 tablet (40 mg total) by mouth daily. 10/27/17  Yes  Patrecia Pour, MD  rosuvastatin (CRESTOR) 10 MG tablet Take 1 tablet (10 mg total) by mouth daily. 10/18/17  Yes [place], Modena Nunnery, MD  umeclidinium bromide (INCRUSE ELLIPTA) 62.5 MCG/INH AEPB Inhale 1 puff into the lungs daily. 10/18/17  Yes , Modena Nunnery, MD  Glucose Blood (BLOOD GLUCOSE TEST STRIPS) STRP Please dispense per patient and insurance preference. Use as directed to monitor FSBS  3x daily. Dx: E11.65. 10/28/17   Alycia Rossetti, MD  Lancet Devices MISC Please dispense per patient and insurance preference. Use as directed to monitor FSBS  3x daily. Dx: E11.65. 10/18/17   Alycia Rossetti, MD  potassium chloride SA (K-DUR,KLOR-CON) 20 MEQ tablet Take 1 tablet (20 mEq total) by mouth daily. Patient not taking: Reported on 11/03/2017 10/18/17   Alycia Rossetti, MD    Family History Family History  Problem Relation Age of Onset  . Diabetes Mother   . Heart disease Father   . Cancer Father     Social History Social History   Tobacco Use  . Smoking status: Former Research scientist (life sciences)  . Smokeless tobacco: Never Used  . Tobacco comment: stopped 2018  Substance Use Topics  . Alcohol use: No  . Drug use: No     Allergies   Niacin and related   Review of Systems Review of Systems  Constitutional: Negative for chills and fever.  HENT: Negative for ear pain and sore throat.   Eyes: Negative for pain and visual disturbance.  Respiratory: Negative for cough and shortness of breath.   Cardiovascular: Negative for chest pain and palpitations.  Gastrointestinal: Negative for abdominal pain and vomiting.  Genitourinary: Negative for dysuria and hematuria.  Musculoskeletal: Negative for arthralgias and back pain.  Skin: Negative for color change and rash.  Neurological: Positive for dizziness, weakness and numbness. Negative for seizures and syncope.  All other systems reviewed and are negative.    Physical Exam Updated Vital Signs BP (!) 176/85   Pulse 79   Temp 98 F (36.7 C)    Resp 20   LMP 12/10/2012   SpO2 99%   Physical Exam  Constitutional: She is oriented to person, place, and time. She appears well-developed and well-nourished. No distress.  HENT:  Head: Normocephalic and atraumatic.  Eyes: Conjunctivae are normal.  Neck: Neck supple.  Cardiovascular: Normal rate and regular rhythm.  No murmur heard. Pulmonary/Chest: Effort normal. No respiratory distress. She has wheezes.  Diffuse insp and exp wheezing  Abdominal: Soft. There is no tenderness.  Musculoskeletal: She exhibits no edema.  Neurological: She is alert and oriented to person, place, and time.  Patient has 5 out of 5 strength in her left upper extremity and left lower extremity.  There is 4 out of 5 strength in right shoulder flexion with positive drift.  Her grip strengths are equal and 5 out of 5 bilaterally.  She  does have right leg drift as well with 4 out of 5 strength in hip flexion.  There is no gross cranial nerve deficit, however her speech is slurred.  Skin: Skin is warm and dry.  Psychiatric: She has a normal mood and affect.  Nursing note and vitals reviewed.    ED Treatments / Results  Labs (all labs ordered are listed, but only abnormal results are displayed) Labs Reviewed  COMPREHENSIVE METABOLIC PANEL - Abnormal; Notable for the following components:      Result Value   Glucose, Bld 218 (*)    BUN 25 (*)    Creatinine, Ser 1.09 (*)    ALT 10 (*)    GFR calc non Af Amer 56 (*)    All other components within normal limits  I-STAT CHEM 8, ED - Abnormal; Notable for the following components:   BUN 25 (*)    Glucose, Bld 215 (*)    All other components within normal limits  PROTIME-INR  APTT  CBC  DIFFERENTIAL  URINALYSIS, ROUTINE W REFLEX MICROSCOPIC  LIPID PANEL  I-STAT TROPONIN, ED  CBG MONITORING, ED    EKG EKG Interpretation  Date/Time:  Sunday Dec 05 2017 12:25:29 EDT Ventricular Rate:  65 PR Interval:    QRS Duration: 82 QT Interval:  452 QTC  Calculation: 470 R Axis:   -28 Text Interpretation:  Sinus rhythm with Premature atrial complexes Inferior infarct , age undetermined Anterior infarct , age undetermined T wave abnormality, consider lateral ischemia Abnormal ECG No significant change since last tracing Confirmed by Wandra Arthurs [phone]) on 12/05/2017 3:47:45 PM   Radiology Ct Angio Head W Or Wo Contrast  Result Date: 12/05/2017 CLINICAL DATA:  57 year old female with acute and subacute on chronic small vessel infarcts demonstrated on MRI today performed for dizziness and difficulty walking. EXAM: CT ANGIOGRAPHY HEAD AND NECK TECHNIQUE: Multidetector CT imaging of the head and neck was performed using the standard protocol during bolus administration of intravenous contrast. Multiplanar CT image reconstructions and MIPs were obtained to evaluate the vascular anatomy. Carotid stenosis measurements (when applicable) are obtained utilizing NASCET criteria, using the distal internal carotid diameter as the denominator. CONTRAST:  27mL ISOVUE-370 IOPAMIDOL (ISOVUE-370) INJECTION 76% COMPARISON:  Brain MRI 1659 hours today. FINDINGS: CTA NECK Skeleton: Occasional carious dentition. No acute osseous abnormality identified. Upper chest: Negative lung apices, but abnormally increased mediastinal lymph nodes, such as the anterior carina node on series 5, image 167. There may also be increased hilar nodes. Other neck: The glottis is closed.  No neck mass or lymphadenopathy. Aortic arch: 3 vessel arch configuration. Abundant soft plaque of the arch. Minimal calcified plaque probably just proximal to the brachiocephalic artery origin rather than ulcerated plaque (series 8, image 131). Right carotid system: No right CCA origin stenosis despite soft plaque. Minimal plaque at the right carotid bifurcation. No cervical right ICA stenosis, but the vessel is tortuous just below the skull base. Left carotid system: No stenosis despite soft plaque at the left CCA  origin. Tortuous proximal left CCA. Soft plaque at the left ICA origin and bulb but no stenosis. Tortuous left ICA just below the skull base. Vertebral arteries: No proximal right subclavian artery or right vertebral artery origin stenosis despite soft plaque. The right vertebral artery is dominant and tortuous in the neck but patent to the skull base without stenosis. Abundant proximal left subclavian soft plaques similar to that in the arch. Focal low-density plaque is evident on series 5, image 145.  Still there is no hemodynamically significant left subclavian stenosis. Likewise, only mild left vertebral artery origin stenosis due to soft plaque. The left vertebral artery is non dominant and tortuous in the neck. No cervical left vertebral stenosis. CTA HEAD Posterior circulation: Mildly dominant distal right vertebral artery without stenosis. Dominant right AICA. Patent distal left vertebral artery with moderate stenosis affecting a long segment of the distal V4 just proximal to the vertebrobasilar junction (series 7, image 132). The left PICA origin is patent. Patent basilar artery with mild irregularity proximally, and mild to moderate circumferential stenosis distally proximal to the basilar tip (series 12, image 23). The SCA and PCA origins remain patent. There is mild irregularity throughout the right PCA, but no stenosis or occlusion. Lesser left PCA irregularity. Posterior communicating arteries are diminutive or absent. Anterior circulation: Both ICA siphons are patent. There is mild irregularity throughout both siphons with intermittent calcified plaque. No significant siphon stenosis. Normal ophthalmic artery origins. Patent carotid termini. Patent MCA and ACA origins. The proximal ACA is are within normal limits. There is mild distal ACA irregularity. The left MCA M1 segment is mildly irregular and bifurcates early. No significant stenosis or left MCA branch occlusion identified. Mild left MCA branch  irregularity. The right MCA is patent without stenosis. There is moderate irregularity and stenosis at the origin of the dominant right M2 posterior branch (series 11, image 15. Mild right MCA branch irregularity otherwise. Venous sinuses: Patent on the delayed images. Anatomic variants: Mildly dominant right vertebral artery. Delayed phase: No abnormal enhancement identified. Stable gray-white matter differentiation throughout the brain. Review of the MIP images confirms the above findings IMPRESSION: 1. Negative for large vessel occlusion. 2. Positive for widespread atherosclerosis in the head, neck, and chest. Abundant soft plaque of the aortic arch and the proximal great vessels. 3. No hemodynamically significant stenosis in the neck, but Moderate intracranial stenoses: - distal Left vertebral artery. - distal 3rd Basilar Artery proximal to the SCA origins. - Right MCA posterior M2 branch origin. 4.  Stable CT appearance of the brain since 1250 hours today. 5. Nonspecific mediastinal lymphadenopathy is partially visible. RECOMMEND routine follow-up Chest CT (IV contrast preferred) further characterize. Electronically Signed   By: Genevie Ann M.D.   On: 12/05/2017 18:55   Ct Head Wo Contrast  Result Date: 12/05/2017 CLINICAL DATA:  Dizziness and difficulty walking beginning this morning. Normal last night. EXAM: CT HEAD WITHOUT CONTRAST TECHNIQUE: Contiguous axial images were obtained from the base of the skull through the vertex without intravenous contrast. COMPARISON:  CT and MRI 10/26/2017 FINDINGS: Brain: Generalized atrophy. Chronic small-vessel changes affecting the pons. Old small vessel infarctions affecting the thalami and basal ganglia. Chronic small-vessel ischemic changes of the white matter. No sign of acute infarction, mass lesion, hemorrhage, hydrocephalus or extra-axial collection. Vascular: There is atherosclerotic calcification of the major vessels at the base of the brain. Skull: Negative  Sinuses/Orbits: Clear/normal Other: None IMPRESSION: No acute finding by CT. Extensive old ischemic changes throughout the brain as outlined above. Electronically Signed   By: Nelson Chimes M.D.   On: 12/05/2017 13:03   Ct Angio Neck W Or Wo Contrast  Result Date: 12/05/2017 CLINICAL DATA:  57 year old female with acute and subacute on chronic small vessel infarcts demonstrated on MRI today performed for dizziness and difficulty walking. EXAM: CT ANGIOGRAPHY HEAD AND NECK TECHNIQUE: Multidetector CT imaging of the head and neck was performed using the standard protocol during bolus administration of intravenous contrast. Multiplanar CT image  reconstructions and MIPs were obtained to evaluate the vascular anatomy. Carotid stenosis measurements (when applicable) are obtained utilizing NASCET criteria, using the distal internal carotid diameter as the denominator. CONTRAST:  48mL ISOVUE-370 IOPAMIDOL (ISOVUE-370) INJECTION 76% COMPARISON:  Brain MRI 1659 hours today. FINDINGS: CTA NECK Skeleton: Occasional carious dentition. No acute osseous abnormality identified. Upper chest: Negative lung apices, but abnormally increased mediastinal lymph nodes, such as the anterior carina node on series 5, image 167. There may also be increased hilar nodes. Other neck: The glottis is closed.  No neck mass or lymphadenopathy. Aortic arch: 3 vessel arch configuration. Abundant soft plaque of the arch. Minimal calcified plaque probably just proximal to the brachiocephalic artery origin rather than ulcerated plaque (series 8, image 131). Right carotid system: No right CCA origin stenosis despite soft plaque. Minimal plaque at the right carotid bifurcation. No cervical right ICA stenosis, but the vessel is tortuous just below the skull base. Left carotid system: No stenosis despite soft plaque at the left CCA origin. Tortuous proximal left CCA. Soft plaque at the left ICA origin and bulb but no stenosis. Tortuous left ICA just below  the skull base. Vertebral arteries: No proximal right subclavian artery or right vertebral artery origin stenosis despite soft plaque. The right vertebral artery is dominant and tortuous in the neck but patent to the skull base without stenosis. Abundant proximal left subclavian soft plaques similar to that in the arch. Focal low-density plaque is evident on series 5, image 145. Still there is no hemodynamically significant left subclavian stenosis. Likewise, only mild left vertebral artery origin stenosis due to soft plaque. The left vertebral artery is non dominant and tortuous in the neck. No cervical left vertebral stenosis. CTA HEAD Posterior circulation: Mildly dominant distal right vertebral artery without stenosis. Dominant right AICA. Patent distal left vertebral artery with moderate stenosis affecting a long segment of the distal V4 just proximal to the vertebrobasilar junction (series 7, image 132). The left PICA origin is patent. Patent basilar artery with mild irregularity proximally, and mild to moderate circumferential stenosis distally proximal to the basilar tip (series 12, image 23). The SCA and PCA origins remain patent. There is mild irregularity throughout the right PCA, but no stenosis or occlusion. Lesser left PCA irregularity. Posterior communicating arteries are diminutive or absent. Anterior circulation: Both ICA siphons are patent. There is mild irregularity throughout both siphons with intermittent calcified plaque. No significant siphon stenosis. Normal ophthalmic artery origins. Patent carotid termini. Patent MCA and ACA origins. The proximal ACA is are within normal limits. There is mild distal ACA irregularity. The left MCA M1 segment is mildly irregular and bifurcates early. No significant stenosis or left MCA branch occlusion identified. Mild left MCA branch irregularity. The right MCA is patent without stenosis. There is moderate irregularity and stenosis at the origin of the  dominant right M2 posterior branch (series 11, image 15. Mild right MCA branch irregularity otherwise. Venous sinuses: Patent on the delayed images. Anatomic variants: Mildly dominant right vertebral artery. Delayed phase: No abnormal enhancement identified. Stable gray-white matter differentiation throughout the brain. Review of the MIP images confirms the above findings IMPRESSION: 1. Negative for large vessel occlusion. 2. Positive for widespread atherosclerosis in the head, neck, and chest. Abundant soft plaque of the aortic arch and the proximal great vessels. 3. No hemodynamically significant stenosis in the neck, but Moderate intracranial stenoses: - distal Left vertebral artery. - distal 3rd Basilar Artery proximal to the SCA origins. - Right MCA posterior M2  branch origin. 4.  Stable CT appearance of the brain since 1250 hours today. 5. Nonspecific mediastinal lymphadenopathy is partially visible. RECOMMEND routine follow-up Chest CT (IV contrast preferred) further characterize. Electronically Signed   By: Genevie Ann M.D.   On: 12/05/2017 18:55   Mr Brain Wo Contrast  Result Date: 12/05/2017 CLINICAL DATA:  57 year old female with dizziness, difficulty walking beginning this morning. EXAM: MRI HEAD WITHOUT CONTRAST TECHNIQUE: Multiplanar, multiecho pulse sequences of the brain and surrounding structures were obtained without intravenous contrast. COMPARISON:  Head CT without contrast 1250 hours today. Brain MRI 10/26/2017, and earlier. FINDINGS: Brain: There are 2 small areas of abnormal diffusion in the left frontal lobe. A nodular focus of restriction in the left periventricular white matter along the anterior left frontal horn is noted on series 501, image 58 (ADC map image 22). A patchy curvilinear area in the left corona radiata appears only partially restricted (image 57). Both there is demonstrate T2 and FLAIR hyperintensity. No associated hemorrhage or mass effect. No other No restricted diffusion  or evidence of acute infarction. Abnormal T2 and FLAIR signal in the pons, throughout the bilateral deep gray matter nuclei, and cerebral white matter most resembling advanced chronic small vessel ischemia. Focal involvement of the deep white matter capsules, and also the anterior body of the corpus callosum which is abnormally thinned (series 7001, image 12), and mild chronic blood products also noted at this site (series 11001, image 40). Chronic hemorrhage also in the left deep gray matter nuclei and lower brainstem (image 15). The cerebellar hemispheres appear spared. No cerebral cortical encephalomalacia is identified. No midline shift, mass effect, evidence of mass lesion, ventriculomegaly, extra-axial collection or acute intracranial hemorrhage. Cervicomedullary junction and pituitary are within normal limits. Vascular: Major intracranial vascular flow voids are stable. Skull and upper cervical spine: Negative visible cervical spine. Visualized bone marrow signal is within normal limits. Sinuses/Orbits: Stable and negative. Other: Mastoid air cells remain clear. Grossly normal visible internal auditory structures. Negative scalp and face soft tissues. IMPRESSION: 1. Small acute lacunar infarct along the left frontal horn periventricular white matter. Small subacute lacunar infarct in the left posterior corona radiata. No acute hemorrhage or mass effect. 2. Underlying very advanced chronic small vessel disease redemonstrated. Numerous chronic lacunar infarcts in the deep gray matter nuclei, brainstem, and cerebral white matter including the corpus callosum. Electronically Signed   By: Genevie Ann M.D.   On: 12/05/2017 17:34    Procedures Procedures (including critical care time)  Medications Ordered in ED Medications  iopamidol (ISOVUE-370) 76 % injection (has no administration in time range)  rosuvastatin (CRESTOR) tablet 40 mg (has no administration in time range)  aspirin EC tablet 325 mg (has no  administration in time range)  nicotine (NICODERM CQ - dosed in mg/24 hours) patch 21 mg (21 mg Transdermal Refused 12/05/17 2011)  sodium chloride 0.9 % bolus 1,000 mL (0 mLs Intravenous Stopped 12/05/17 2011)  iopamidol (ISOVUE-370) 76 % injection 50 mL (50 mLs Intravenous Contrast Given 12/05/17 1821)  aspirin chewable tablet 243 mg (243 mg Oral Given 12/05/17 2009)     Initial Impression / Assessment and Plan / ED Course  I have reviewed the triage vital signs and the nursing notes.  Pertinent labs & imaging results that were available during my care of the patient were reviewed by me and considered in my medical decision making (see chart for details).    Patient is a 57 year old female with history as above who presents with  right-sided weakness and slurred speech since this morning.  She woke up with the symptoms.  She reports they are similar to right-sided deficits she has had with her prior to stroke.  However, they had almost completely resolved prior to this morning.  Here, she continues to have a right-sided weakness and dysarthria.  He does not have any aphasia.  She does have an ataxic gait.  Given that she awoke with the symptoms, she is not a candidate for TPA.  Neurology consulted.  CT scan showed no acute ischemic changes.  MRI was obtained which did show an acute infarct.  Patient will be admitted to hospitalist for further work-up and treatment.  Of note, patient has also been wearing a loop recorder and based on the chart review, it appears that she has been having some 3-second pauses overnight.  This information was relayed to the hospitalist as well.  Final Clinical Impressions(s) / ED Diagnoses   Final diagnoses:  Cerebrovascular accident (CVA), unspecified mechanism [facility])    ED Discharge Orders    None       Clifton James, MD 12/05/17 2030    Drenda Freeze, MD 12/08/17 1452

## 2017-12-06 ENCOUNTER — Inpatient Hospital Stay (HOSPITAL_COMMUNITY): Payer: BLUE CROSS/BLUE SHIELD

## 2017-12-06 ENCOUNTER — Other Ambulatory Visit: Payer: Self-pay

## 2017-12-06 ENCOUNTER — Telehealth: Payer: Self-pay | Admitting: Internal Medicine

## 2017-12-06 ENCOUNTER — Encounter (HOSPITAL_COMMUNITY): Payer: Self-pay

## 2017-12-06 ENCOUNTER — Other Ambulatory Visit (HOSPITAL_COMMUNITY): Payer: BLUE CROSS/BLUE SHIELD

## 2017-12-06 DIAGNOSIS — I340 Nonrheumatic mitral (valve) insufficiency: Secondary | ICD-10-CM

## 2017-12-06 DIAGNOSIS — I639 Cerebral infarction, unspecified: Secondary | ICD-10-CM

## 2017-12-06 DIAGNOSIS — E1165 Type 2 diabetes mellitus with hyperglycemia: Secondary | ICD-10-CM

## 2017-12-06 DIAGNOSIS — Z720 Tobacco use: Secondary | ICD-10-CM

## 2017-12-06 LAB — GLUCOSE, CAPILLARY
GLUCOSE-CAPILLARY: 97 mg/dL (ref 65–99)
GLUCOSE-CAPILLARY: 69 mg/dL (ref 65–99)
Glucose-Capillary: 79 mg/dL (ref 65–99)
Glucose-Capillary: 75 mg/dL (ref 65–99)

## 2017-12-06 LAB — HEMOGLOBIN A1C
Hgb A1c MFr Bld: 8.4 % — ABNORMAL HIGH (ref 4.8–5.6)
Mean Plasma Glucose: 194.38 mg/dL

## 2017-12-06 LAB — COMPREHENSIVE METABOLIC PANEL
ALK PHOS: 65 U/L (ref 38–126)
ALT: 9 U/L — ABNORMAL LOW (ref 14–54)
ANION GAP: 12 (ref 5–15)
AST: 16 U/L (ref 15–41)
Albumin: 3.3 g/dL — ABNORMAL LOW (ref 3.5–5.0)
BUN: 15 mg/dL (ref 6–20)
CALCIUM: 9.1 mg/dL (ref 8.9–10.3)
CHLORIDE: 104 mmol/L (ref 101–111)
CO2: 25 mmol/L (ref 22–32)
Creatinine, Ser: 0.83 mg/dL (ref 0.44–1.00)
GFR calc non Af Amer: >60 mL/min (ref >60)
Glucose, Bld: 109 mg/dL — ABNORMAL HIGH (ref 65–99)
POTASSIUM: 2.8 mmol/L — AB (ref 3.5–5.1)
SODIUM: 141 mmol/L (ref 135–145)
Total Bilirubin: 0.4 mg/dL (ref 0.3–1.2)
Total Protein: 7.2 g/dL (ref 6.5–8.1)

## 2017-12-06 LAB — LIPID PANEL
Cholesterol: 169 mg/dL (ref 0–200)
HDL: 49 mg/dL (ref >40)
LDL CALC: 93 mg/dL (ref 0–99)
Total CHOL/HDL Ratio: 3.4 RATIO
Triglycerides: 137 mg/dL (ref <150)
VLDL: 27 mg/dL (ref 0–40)

## 2017-12-06 LAB — BASIC METABOLIC PANEL
Anion gap: 13 (ref 5–15)
BUN: 11 mg/dL (ref 6–20)
CALCIUM: 9.2 mg/dL (ref 8.9–10.3)
CHLORIDE: 106 mmol/L (ref 101–111)
CO2: 22 mmol/L (ref 22–32)
CREATININE: 0.8 mg/dL (ref 0.44–1.00)
GFR calc Af Amer: >60 mL/min (ref >60)
GFR calc non Af Amer: >60 mL/min (ref >60)
GLUCOSE: 79 mg/dL (ref 65–99)
Potassium: 3.2 mmol/L — ABNORMAL LOW (ref 3.5–5.1)
Sodium: 141 mmol/L (ref 135–145)

## 2017-12-06 LAB — MAGNESIUM: MAGNESIUM: 1.7 mg/dL (ref 1.7–2.4)

## 2017-12-06 MED ORDER — ASPIRIN 300 MG RE SUPP: 300.0000 mg | Freq: Every day | RECTAL | Status: DC

## 2017-12-06 MED ORDER — SODIUM CHLORIDE 0.9 % IV BOLUS
500.0000 mL | Freq: Once | INTRAVENOUS | Status: AC
  Administered 2017-12-06: 500 mL via INTRAVENOUS

## 2017-12-06 MED ORDER — POTASSIUM CHLORIDE 10 MEQ/100ML IV SOLN
10.0000 meq | INTRAVENOUS | Status: AC
  Administered 2017-12-06 (×4): 10 meq via INTRAVENOUS
  Filled 2017-12-06 (×4): qty 100

## 2017-12-06 NOTE — Telephone Encounter (Signed)
Received [employer] monitor report from 12/04/17 @ 03:54 EDT of HR 30bpm with 3.5 second pause, 1st degree AVB. Per fax, monitoring company attempted to contact patient but was unsuccessful.   Patient currently admitted.   Routed to Dr. Debara Pickett as Kelly Gibson

## 2017-12-06 NOTE — ED Notes (Signed)
Assisted pt with breakfast.

## 2017-12-06 NOTE — Telephone Encounter (Signed)
Thanks .Marland Kitchen I spoke with EP. They are not recommending a pacer. She will continue monitor after discharge. Apparently had recurrent stroke - not related to pauses.  Dr. Lemmie Evens

## 2017-12-06 NOTE — Progress Notes (Signed)
  Echocardiogram 2D Echocardiogram has been performed.  Kelly Gibson G Kelly Gibson 12/06/2017, 11:33 AM

## 2017-12-06 NOTE — Progress Notes (Signed)
PT Cancellation Note  Patient Details Name: Tamikia Chowning MRN: [id number] DOB: 1960-12-25   Cancelled Treatment:    Reason Eval/Treat Not Completed: Patient at procedure or test/unavailable; patient just went down of vascular lab testing.  Also noted order changed to start tomorrow.  Will follow up next day.   Reginia Naas 12/06/2017, 11:47 AM  Magda Kiel, [place] 12/06/2017

## 2017-12-06 NOTE — ED Notes (Addendum)
Paged Dr. Jonelle Sidle to let them know that pt passed her Stroke Swallow Screen and needs a diet order placed.

## 2017-12-06 NOTE — Plan of Care (Signed)
  Problem: Education: Goal: Knowledge of patient specific risk factors addressed and post discharge goals established will improve Outcome: Progressing   

## 2017-12-06 NOTE — ED Notes (Signed)
Attempted to give report and the nurse advised she would not take report as she was getting report from off going nurse. Explained to the nurse that this RN was leaving as well and just wanted to keep continuity of care in case she had any questions for this RN as this RN was leaving for the day. RN advised again she would not take report.

## 2017-12-06 NOTE — Progress Notes (Signed)
OT Cancellation Note  Patient Details Name: Kelly Gibson MRN: [id number] DOB: 05/03/1961   Cancelled Treatment:    Reason Eval/Treat Not Completed: Patient not medically ready. OT evaluation order changed to start tomorrow (12/07/17). Will follow up tomorrow.    Gypsy Decant 12/06/2017, 3:24 PM

## 2017-12-06 NOTE — Progress Notes (Signed)
PROGRESS NOTE    Kelly Gibson  SNK:[id number] DOB: [birth year] DOA: 12/05/2017 PCP: Alycia Rossetti, MD   Outpatient Specialists:     Brief Narrative:  Kelly Gibson is a 57 y.o. female with medical history significant of diabetes, hypertension, hyperlipidemia, COPD as well as recent CVA in March of this ear that left her with residual right sided weakness. Patient came to the ER today complaining of dizziness. She woke up this morning and felt weak n the same side that felt like her previous CVA. She also felt dizzy. Patient said she was able to do all her ADLs prior to these.She was improving from her previous stroke in March. She has a loop recorder in place which showed about 2 seconds pauses last night. In the ER patient was evaluated and found to have a new infarct. She's only been evaluated by neurology and we are admitting her for further workup.   Assessment & Plan:   Principal Problem:   Cerebrovascular disease Active Problems:   Diabetes mellitus type II, uncontrolled ([place])   Essential hypertension, benign   Tobacco user   Uncontrolled hypertension   Acute CVA (cerebrovascular accident) ([place])   acute CVA:  -ASA -SLP eval (from first eval by RN to now appears that she has worsened with facial droop as well as speech) -IVF to maintain BP -echo: pending -CTA: No hemodynamically significant stenosis in the neck, but Moderate intracranial stenoses: - distal Left vertebral artery. - distal 3rd Basilar Artery proximal to the SCA origins. - Right MCA posterior M2 branch origin. -neurology consult -LE duplex negative for DVT -PT/OT eval -on statin (LDL: 93) -recently in hospital in [facility] in February and [facility] in April for CVA like symptoms and CVAs)  Sinus bradycardia with pauses -cards to see -? Sleep apnea  Mediastinal lymphadenopathy - CT scan of chest with contrast- inpt vs outpatient   Hypokalemia -not able to swallow so will replace IV -Mg pending   IDDM  -recent HgbA1c was uncontrolled (12.2---> 8.4) -continue SSI/lantus  Hypertension:  -Permissive hypertension CVA.  tobacco abuse:  -encouraged cessation         DVT prophylaxis:  Lovenox   Code Status: Full Code   Family Communication: At bedside  Disposition Plan:     Consultants:   neuro     Subjective: Appears symptoms have worsened overnight-- speech declined, swallowing worsened as well as facial droop developed  Objective: Vitals:   12/06/17 0700 12/06/17 0932 12/06/17 1100 12/06/17 1257  BP: (!) 145/81 140/88 (!) 180/104 (!) 169/93  Pulse: 76 81  (!) 50  Resp: 18 16  16   Temp:  97.8 F (36.6 C)  98 F (36.7 C)  TempSrc:  Oral  Oral  SpO2: 100% 100%  96%    Intake/Output Summary (Last 24 hours) at 12/06/2017 1354 Last data filed at 12/05/2017 2011 Gross per 24 hour  Intake 1000 ml  Output -  Net 1000 ml   There were no vitals filed for this visit.  Examination:  General exam: uncomfortable appearing Respiratory system: no increased work of breathing Cardiovascular system: rrr Gastrointestinal system: +BS, soft Central nervous system: Alert- right facial droop, weaker right arm > left arm Skin: No rashes, lesions or ulcers Psychiatry: nervous appearing    Data Reviewed: I have personally reviewed following labs and imaging studies  CBC: Recent Labs  Lab 12/05/17 1244 12/05/17 1251 12/05/17 2226  WBC 5.4  --  5.6  NEUTROABS 2.6  --   --  HGB 12.4 12.9 11.9*  HCT 36.9 38.0 35.1*  MCV 83.3  --  82.0  PLT 393  --  115   Basic Metabolic Panel: Recent Labs  Lab 12/05/17 1244 12/05/17 1251 12/05/17 2226 12/06/17 0459  NA 141 141  --  141  K 3.7 3.7  --  2.8*  CL 103 103  --  104  CO2 28  --   --  25  GLUCOSE 218* 215*  --  109*  BUN 25* 25*  --  15  CREATININE 1.09* 1.00 0.86 0.83  CALCIUM 9.6  --   --  9.1   GFR: CrCl cannot be calculated (Unknown ideal weight.). Liver Function Tests: Recent Labs    Lab 12/05/17 1244 12/06/17 0459  AST 18 16  ALT 10* 9*  ALKPHOS 72 65  BILITOT 0.4 0.4  PROT 7.6 7.2  ALBUMIN 3.8 3.3*   No results for input(s): LIPASE, AMYLASE in the last 168 hours. No results for input(s): AMMONIA in the last 168 hours. Coagulation Profile: Recent Labs  Lab 12/05/17 1244  INR 1.04   Cardiac Enzymes: No results for input(s): CKTOTAL, CKMB, CKMBINDEX, TROPONINI in the last 168 hours. BNP (last 3 results) No results for input(s): PROBNP in the last 8760 hours. HbA1C: Recent Labs    12/06/17 0459  HGBA1C 8.4*   CBG: Recent Labs  Lab 12/05/17 2221 12/06/17 1137  GLUCAP 144* 97   Lipid Profile: Recent Labs    12/05/17 2021 12/06/17 0459  CHOL 182 169  HDL 57 49  LDLCALC 100* 93  TRIG 125 137  CHOLHDL 3.2 3.4   Thyroid Function Tests: No results for input(s): TSH, T4TOTAL, FREET4, T3FREE, THYROIDAB in the last 72 hours. Anemia Panel: No results for input(s): VITAMINB12, FOLATE, FERRITIN, TIBC, IRON, RETICCTPCT in the last 72 hours. Urine analysis:    Component Value Date/Time   COLORURINE STRAW (A) 12/05/2017 2226   APPEARANCEUR CLEAR 12/05/2017 2226   LABSPEC 1.019 12/05/2017 2226   PHURINE 7.0 12/05/2017 2226   GLUCOSEU NEGATIVE 12/05/2017 2226   HGBUR NEGATIVE 12/05/2017 2226   BILIRUBINUR NEGATIVE 12/05/2017 2226   KETONESUR NEGATIVE 12/05/2017 2226   PROTEINUR NEGATIVE 12/05/2017 2226   UROBILINOGEN 0.2 01/26/2007 1410   NITRITE NEGATIVE 12/05/2017 2226   LEUKOCYTESUR NEGATIVE 12/05/2017 2226     )No results found for this or any previous visit (from the past 240 hour(s)).    Anti-infectives (From admission, onward)   None       Radiology Studies: Ct Angio Head W Or Wo Contrast  Result Date: 12/05/2017 CLINICAL DATA:  57 year old female with acute and subacute on chronic small vessel infarcts demonstrated on MRI today performed for dizziness and difficulty walking. EXAM: CT ANGIOGRAPHY HEAD AND NECK TECHNIQUE:  Multidetector CT imaging of the head and neck was performed using the standard protocol during bolus administration of intravenous contrast. Multiplanar CT image reconstructions and MIPs were obtained to evaluate the vascular anatomy. Carotid stenosis measurements (when applicable) are obtained utilizing NASCET criteria, using the distal internal carotid diameter as the denominator. CONTRAST:  82mL ISOVUE-370 IOPAMIDOL (ISOVUE-370) INJECTION 76% COMPARISON:  Brain MRI 1659 hours today. FINDINGS: CTA NECK Skeleton: Occasional carious dentition. No acute osseous abnormality identified. Upper chest: Negative lung apices, but abnormally increased mediastinal lymph nodes, such as the anterior carina node on series 5, image 167. There may also be increased hilar nodes. Other neck: The glottis is closed.  No neck mass or lymphadenopathy. Aortic arch: 3 vessel arch configuration. Abundant  soft plaque of the arch. Minimal calcified plaque probably just proximal to the brachiocephalic artery origin rather than ulcerated plaque (series 8, image 131). Right carotid system: No right CCA origin stenosis despite soft plaque. Minimal plaque at the right carotid bifurcation. No cervical right ICA stenosis, but the vessel is tortuous just below the skull base. Left carotid system: No stenosis despite soft plaque at the left CCA origin. Tortuous proximal left CCA. Soft plaque at the left ICA origin and bulb but no stenosis. Tortuous left ICA just below the skull base. Vertebral arteries: No proximal right subclavian artery or right vertebral artery origin stenosis despite soft plaque. The right vertebral artery is dominant and tortuous in the neck but patent to the skull base without stenosis. Abundant proximal left subclavian soft plaques similar to that in the arch. Focal low-density plaque is evident on series 5, image 145. Still there is no hemodynamically significant left subclavian stenosis. Likewise, only mild left vertebral  artery origin stenosis due to soft plaque. The left vertebral artery is non dominant and tortuous in the neck. No cervical left vertebral stenosis. CTA HEAD Posterior circulation: Mildly dominant distal right vertebral artery without stenosis. Dominant right AICA. Patent distal left vertebral artery with moderate stenosis affecting a long segment of the distal V4 just proximal to the vertebrobasilar junction (series 7, image 132). The left PICA origin is patent. Patent basilar artery with mild irregularity proximally, and mild to moderate circumferential stenosis distally proximal to the basilar tip (series 12, image 23). The SCA and PCA origins remain patent. There is mild irregularity throughout the right PCA, but no stenosis or occlusion. Lesser left PCA irregularity. Posterior communicating arteries are diminutive or absent. Anterior circulation: Both ICA siphons are patent. There is mild irregularity throughout both siphons with intermittent calcified plaque. No significant siphon stenosis. Normal ophthalmic artery origins. Patent carotid termini. Patent MCA and ACA origins. The proximal ACA is are within normal limits. There is mild distal ACA irregularity. The left MCA M1 segment is mildly irregular and bifurcates early. No significant stenosis or left MCA branch occlusion identified. Mild left MCA branch irregularity. The right MCA is patent without stenosis. There is moderate irregularity and stenosis at the origin of the dominant right M2 posterior branch (series 11, image 15. Mild right MCA branch irregularity otherwise. Venous sinuses: Patent on the delayed images. Anatomic variants: Mildly dominant right vertebral artery. Delayed phase: No abnormal enhancement identified. Stable gray-white matter differentiation throughout the brain. Review of the MIP images confirms the above findings IMPRESSION: 1. Negative for large vessel occlusion. 2. Positive for widespread atherosclerosis in the head, neck, and  chest. Abundant soft plaque of the aortic arch and the proximal great vessels. 3. No hemodynamically significant stenosis in the neck, but Moderate intracranial stenoses: - distal Left vertebral artery. - distal 3rd Basilar Artery proximal to the SCA origins. - Right MCA posterior M2 branch origin. 4.  Stable CT appearance of the brain since 1250 hours today. 5. Nonspecific mediastinal lymphadenopathy is partially visible. RECOMMEND routine follow-up Chest CT (IV contrast preferred) further characterize. Electronically Signed   By: Genevie Ann M.D.   On: 12/05/2017 18:55   Ct Head Wo Contrast  Result Date: 12/05/2017 CLINICAL DATA:  Dizziness and difficulty walking beginning this morning. Normal last night. EXAM: CT HEAD WITHOUT CONTRAST TECHNIQUE: Contiguous axial images were obtained from the base of the skull through the vertex without intravenous contrast. COMPARISON:  CT and MRI 10/26/2017 FINDINGS: Brain: Generalized atrophy. Chronic small-vessel changes  affecting the pons. Old small vessel infarctions affecting the thalami and basal ganglia. Chronic small-vessel ischemic changes of the white matter. No sign of acute infarction, mass lesion, hemorrhage, hydrocephalus or extra-axial collection. Vascular: There is atherosclerotic calcification of the major vessels at the base of the brain. Skull: Negative Sinuses/Orbits: Clear/normal Other: None IMPRESSION: No acute finding by CT. Extensive old ischemic changes throughout the brain as outlined above. Electronically Signed   By: Nelson Chimes M.D.   On: 12/05/2017 13:03   Ct Angio Neck W Or Wo Contrast  Result Date: 12/05/2017 CLINICAL DATA:  57 year old female with acute and subacute on chronic small vessel infarcts demonstrated on MRI today performed for dizziness and difficulty walking. EXAM: CT ANGIOGRAPHY HEAD AND NECK TECHNIQUE: Multidetector CT imaging of the head and neck was performed using the standard protocol during bolus administration of  intravenous contrast. Multiplanar CT image reconstructions and MIPs were obtained to evaluate the vascular anatomy. Carotid stenosis measurements (when applicable) are obtained utilizing NASCET criteria, using the distal internal carotid diameter as the denominator. CONTRAST:  34mL ISOVUE-370 IOPAMIDOL (ISOVUE-370) INJECTION 76% COMPARISON:  Brain MRI 1659 hours today. FINDINGS: CTA NECK Skeleton: Occasional carious dentition. No acute osseous abnormality identified. Upper chest: Negative lung apices, but abnormally increased mediastinal lymph nodes, such as the anterior carina node on series 5, image 167. There may also be increased hilar nodes. Other neck: The glottis is closed.  No neck mass or lymphadenopathy. Aortic arch: 3 vessel arch configuration. Abundant soft plaque of the arch. Minimal calcified plaque probably just proximal to the brachiocephalic artery origin rather than ulcerated plaque (series 8, image 131). Right carotid system: No right CCA origin stenosis despite soft plaque. Minimal plaque at the right carotid bifurcation. No cervical right ICA stenosis, but the vessel is tortuous just below the skull base. Left carotid system: No stenosis despite soft plaque at the left CCA origin. Tortuous proximal left CCA. Soft plaque at the left ICA origin and bulb but no stenosis. Tortuous left ICA just below the skull base. Vertebral arteries: No proximal right subclavian artery or right vertebral artery origin stenosis despite soft plaque. The right vertebral artery is dominant and tortuous in the neck but patent to the skull base without stenosis. Abundant proximal left subclavian soft plaques similar to that in the arch. Focal low-density plaque is evident on series 5, image 145. Still there is no hemodynamically significant left subclavian stenosis. Likewise, only mild left vertebral artery origin stenosis due to soft plaque. The left vertebral artery is non dominant and tortuous in the neck. No  cervical left vertebral stenosis. CTA HEAD Posterior circulation: Mildly dominant distal right vertebral artery without stenosis. Dominant right AICA. Patent distal left vertebral artery with moderate stenosis affecting a long segment of the distal V4 just proximal to the vertebrobasilar junction (series 7, image 132). The left PICA origin is patent. Patent basilar artery with mild irregularity proximally, and mild to moderate circumferential stenosis distally proximal to the basilar tip (series 12, image 23). The SCA and PCA origins remain patent. There is mild irregularity throughout the right PCA, but no stenosis or occlusion. Lesser left PCA irregularity. Posterior communicating arteries are diminutive or absent. Anterior circulation: Both ICA siphons are patent. There is mild irregularity throughout both siphons with intermittent calcified plaque. No significant siphon stenosis. Normal ophthalmic artery origins. Patent carotid termini. Patent MCA and ACA origins. The proximal ACA is are within normal limits. There is mild distal ACA irregularity. The left MCA M1 segment  is mildly irregular and bifurcates early. No significant stenosis or left MCA branch occlusion identified. Mild left MCA branch irregularity. The right MCA is patent without stenosis. There is moderate irregularity and stenosis at the origin of the dominant right M2 posterior branch (series 11, image 15. Mild right MCA branch irregularity otherwise. Venous sinuses: Patent on the delayed images. Anatomic variants: Mildly dominant right vertebral artery. Delayed phase: No abnormal enhancement identified. Stable gray-white matter differentiation throughout the brain. Review of the MIP images confirms the above findings IMPRESSION: 1. Negative for large vessel occlusion. 2. Positive for widespread atherosclerosis in the head, neck, and chest. Abundant soft plaque of the aortic arch and the proximal great vessels. 3. No hemodynamically significant  stenosis in the neck, but Moderate intracranial stenoses: - distal Left vertebral artery. - distal 3rd Basilar Artery proximal to the SCA origins. - Right MCA posterior M2 branch origin. 4.  Stable CT appearance of the brain since 1250 hours today. 5. Nonspecific mediastinal lymphadenopathy is partially visible. RECOMMEND routine follow-up Chest CT (IV contrast preferred) further characterize. Electronically Signed   By: Genevie Ann M.D.   On: 12/05/2017 18:55   Mr Brain Wo Contrast  Result Date: 12/05/2017 CLINICAL DATA:  57 year old female with dizziness, difficulty walking beginning this morning. EXAM: MRI HEAD WITHOUT CONTRAST TECHNIQUE: Multiplanar, multiecho pulse sequences of the brain and surrounding structures were obtained without intravenous contrast. COMPARISON:  Head CT without contrast 1250 hours today. Brain MRI 10/26/2017, and earlier. FINDINGS: Brain: There are 2 small areas of abnormal diffusion in the left frontal lobe. A nodular focus of restriction in the left periventricular white matter along the anterior left frontal horn is noted on series 501, image 58 (ADC map image 22). A patchy curvilinear area in the left corona radiata appears only partially restricted (image 57). Both there is demonstrate T2 and FLAIR hyperintensity. No associated hemorrhage or mass effect. No other No restricted diffusion or evidence of acute infarction. Abnormal T2 and FLAIR signal in the pons, throughout the bilateral deep gray matter nuclei, and cerebral white matter most resembling advanced chronic small vessel ischemia. Focal involvement of the deep white matter capsules, and also the anterior body of the corpus callosum which is abnormally thinned (series 7001, image 12), and mild chronic blood products also noted at this site (series 11001, image 40). Chronic hemorrhage also in the left deep gray matter nuclei and lower brainstem (image 15). The cerebellar hemispheres appear spared. No cerebral cortical  encephalomalacia is identified. No midline shift, mass effect, evidence of mass lesion, ventriculomegaly, extra-axial collection or acute intracranial hemorrhage. Cervicomedullary junction and pituitary are within normal limits. Vascular: Major intracranial vascular flow voids are stable. Skull and upper cervical spine: Negative visible cervical spine. Visualized bone marrow signal is within normal limits. Sinuses/Orbits: Stable and negative. Other: Mastoid air cells remain clear. Grossly normal visible internal auditory structures. Negative scalp and face soft tissues. IMPRESSION: 1. Small acute lacunar infarct along the left frontal horn periventricular white matter. Small subacute lacunar infarct in the left posterior corona radiata. No acute hemorrhage or mass effect. 2. Underlying very advanced chronic small vessel disease redemonstrated. Numerous chronic lacunar infarcts in the deep gray matter nuclei, brainstem, and cerebral white matter including the corpus callosum. Electronically Signed   By: Genevie Ann M.D.   On: 12/05/2017 17:34        Scheduled Meds: . aspirin EC  325 mg Oral Daily  . enoxaparin (LOVENOX) injection  40 mg Subcutaneous Q24H  .  insulin aspart  0-9 Units Subcutaneous TID WC  . insulin glargine  10 Units Subcutaneous QHS  . nicotine  21 mg Transdermal Daily  . rosuvastatin  40 mg Oral q1800  . umeclidinium bromide  1 puff Inhalation Daily   Continuous Infusions: . sodium chloride Stopped (12/06/17 1102)  . potassium chloride       LOS: 1 day    Time spent: 25 min    Geradine Girt, DO [facility] Pager [phone]  If 7PM-7AM, please contact night-coverage [email or web address] Password [email or web address] 12/06/2017, 1:54 PM

## 2017-12-06 NOTE — Progress Notes (Signed)
*  Preliminary Results* Bilateral lower extremity venous duplex completed. Bilateral lower extremities are negative for deep vein thrombosis. There is no evidence of Baker's cyst bilaterally.  12/06/2017 11:55 AM Maudry Mayhew, BS, RVT, RDCS, RDMS

## 2017-12-06 NOTE — Progress Notes (Signed)
Stroke Team Progress Note  HPI per Dr Cheral Marker ; Kelly Gibson is an 57 y.o. female who presents to the ER on 12/05/2017 after waking up and feeling dizzy. She tells me she had a stroke in March, this left her with with mild right side weakness and numbness. She says these symptoms have gradually improved until this am, when they seemed to be the "same as in March" all over again. At her new post stroke baseline she has mild debility with returning to home life, but tells me she is able to do all her own ADLs (mRS 2). She has additional PMH of COPD, depression, DM2, HLD, HTN. Of note, she has had a LINQ monitor placed and there is a note from [facility] that they recorded SB w/3-4 sec pauses overnight in early morning hrs this am. The patient's husband states that he was told her HR went down into the 30's last night. No full report available at this time. This may account for her dizziness.  Date last known well: 12/05/10 Time last known well: ~2200 tPA Given: No, out of time window.   SUBJECTIVE  I was called in to see this patient emergently during rounds due to weakness of neurological change and examined by the RN. Patient apparently had been talking well on night shift but this morning she was noted to have more difficulty speaking and slurred speech. She is has dense right hemiparesis as well. No family is available at the bedside but the patient's RN was present  OBJECTIVE Most recent Vital Signs: Temp: 65 F (36.7 C) (05/13 1257) Temp Source: Oral (05/13 1257) BP: 169/93 (05/13 1257) Pulse Rate: 50 (05/13 1257) Respiratory Rate: 16 O2 Saturdation: 96%  CBG (last 3)  Recent Labs    12/05/17 2221 12/06/17 1137  GLUCAP 144* 97    Diet:  Diet Order           Diet NPO time specified  Diet effective now          Activity: bedrest VTE Prophylaxis:  SCDs Studies: Results for orders placed or performed during the hospital encounter of 12/05/17 (from the past 24 hour(s))  Lipid  panel     Status: Abnormal   Collection Time: 12/05/17  8:21 PM  Result Value Ref Range   Cholesterol 182 0 - 200 mg/dL   Triglycerides 125 <150 mg/dL   HDL 57 >40 mg/dL   Total CHOL/HDL Ratio 3.2 RATIO   VLDL 25 0 - 40 mg/dL   LDL Cholesterol 100 (H) 0 - 99 mg/dL  CBG monitoring, ED     Status: Abnormal   Collection Time: 12/05/17 10:21 PM  Result Value Ref Range   Glucose-Capillary 144 (H) 65 - 99 mg/dL  Urinalysis, Routine w reflex microscopic     Status: Abnormal   Collection Time: 12/05/17 10:26 PM  Result Value Ref Range   Color, Urine STRAW (A) YELLOW   APPearance CLEAR CLEAR   Specific Gravity, Urine 1.019 1.005 - 1.030   pH 7.0 5.0 - 8.0   Glucose, UA NEGATIVE NEGATIVE mg/dL   Hgb urine dipstick NEGATIVE NEGATIVE   Bilirubin Urine NEGATIVE NEGATIVE   Ketones, ur NEGATIVE NEGATIVE mg/dL   Protein, ur NEGATIVE NEGATIVE mg/dL   Nitrite NEGATIVE NEGATIVE   Leukocytes, UA NEGATIVE NEGATIVE  CBC     Status: Abnormal   Collection Time: 12/05/17 10:26 PM  Result Value Ref Range   WBC 5.6 4.0 - 10.5 K/uL   RBC 4.28 3.87 - 5.11  MIL/uL   Hemoglobin 11.9 (L) 12.0 - 15.0 g/dL   HCT 35.1 (L) 36.0 - 46.0 %   MCV 82.0 78.0 - 100.0 fL   MCH 27.8 26.0 - 34.0 pg   MCHC 33.9 30.0 - 36.0 g/dL   RDW 13.2 11.5 - 15.5 %   Platelets 364 150 - 400 K/uL  Creatinine, serum     Status: None   Collection Time: 12/05/17 10:26 PM  Result Value Ref Range   Creatinine, Ser 0.86 0.44 - 1.00 mg/dL   GFR calc non Af Amer >60 >60 mL/min   GFR calc Af Amer >60 >60 mL/min  Hemoglobin A1c     Status: Abnormal   Collection Time: 12/06/17  4:59 AM  Result Value Ref Range   Hgb A1c MFr Bld 8.4 (H) 4.8 - 5.6 %   Mean Plasma Glucose 194.38 mg/dL  Lipid panel     Status: None   Collection Time: 12/06/17  4:59 AM  Result Value Ref Range   Cholesterol 169 0 - 200 mg/dL   Triglycerides 137 <150 mg/dL   HDL 49 >40 mg/dL   Total CHOL/HDL Ratio 3.4 RATIO   VLDL 27 0 - 40 mg/dL   LDL Cholesterol 93 0 -  99 mg/dL  Comprehensive metabolic panel     Status: Abnormal   Collection Time: 12/06/17  4:59 AM  Result Value Ref Range   Sodium 141 135 - 145 mmol/L   Potassium 2.8 (L) 3.5 - 5.1 mmol/L   Chloride 104 101 - 111 mmol/L   CO2 25 22 - 32 mmol/L   Glucose, Bld 109 (H) 65 - 99 mg/dL   BUN 15 6 - 20 mg/dL   Creatinine, Ser 0.83 0.44 - 1.00 mg/dL   Calcium 9.1 8.9 - 10.3 mg/dL   Total Protein 7.2 6.5 - 8.1 g/dL   Albumin 3.3 (L) 3.5 - 5.0 g/dL   AST 16 15 - 41 U/L   ALT 9 (L) 14 - 54 U/L   Alkaline Phosphatase 65 38 - 126 U/L   Total Bilirubin 0.4 0.3 - 1.2 mg/dL   GFR calc non Af Amer >60 >60 mL/min   GFR calc Af Amer >60 >60 mL/min   Anion gap 12 5 - 15  Glucose, capillary     Status: None   Collection Time: 12/06/17 11:37 AM  Result Value Ref Range   Glucose-Capillary 97 65 - 99 mg/dL   Comment 1 Notify RN    Comment 2 Document in Chart      Ct Angio Head W Or Wo Contrast  Result Date: 12/05/2017 CLINICAL DATA:  57 year old female with acute and subacute on chronic small vessel infarcts demonstrated on MRI today performed for dizziness and difficulty walking. EXAM: CT ANGIOGRAPHY HEAD AND NECK TECHNIQUE: Multidetector CT imaging of the head and neck was performed using the standard protocol during bolus administration of intravenous contrast. Multiplanar CT image reconstructions and MIPs were obtained to evaluate the vascular anatomy. Carotid stenosis measurements (when applicable) are obtained utilizing NASCET criteria, using the distal internal carotid diameter as the denominator. CONTRAST:  58mL ISOVUE-370 IOPAMIDOL (ISOVUE-370) INJECTION 76% COMPARISON:  Brain MRI 1659 hours today. FINDINGS: CTA NECK Skeleton: Occasional carious dentition. No acute osseous abnormality identified. Upper chest: Negative lung apices, but abnormally increased mediastinal lymph nodes, such as the anterior carina node on series 5, image 167. There may also be increased hilar nodes. Other neck: The glottis  is closed.  No neck mass or lymphadenopathy. Aortic arch:  3 vessel arch configuration. Abundant soft plaque of the arch. Minimal calcified plaque probably just proximal to the brachiocephalic artery origin rather than ulcerated plaque (series 8, image 131). Right carotid system: No right CCA origin stenosis despite soft plaque. Minimal plaque at the right carotid bifurcation. No cervical right ICA stenosis, but the vessel is tortuous just below the skull base. Left carotid system: No stenosis despite soft plaque at the left CCA origin. Tortuous proximal left CCA. Soft plaque at the left ICA origin and bulb but no stenosis. Tortuous left ICA just below the skull base. Vertebral arteries: No proximal right subclavian artery or right vertebral artery origin stenosis despite soft plaque. The right vertebral artery is dominant and tortuous in the neck but patent to the skull base without stenosis. Abundant proximal left subclavian soft plaques similar to that in the arch. Focal low-density plaque is evident on series 5, image 145. Still there is no hemodynamically significant left subclavian stenosis. Likewise, only mild left vertebral artery origin stenosis due to soft plaque. The left vertebral artery is non dominant and tortuous in the neck. No cervical left vertebral stenosis. CTA HEAD Posterior circulation: Mildly dominant distal right vertebral artery without stenosis. Dominant right AICA. Patent distal left vertebral artery with moderate stenosis affecting a long segment of the distal V4 just proximal to the vertebrobasilar junction (series 7, image 132). The left PICA origin is patent. Patent basilar artery with mild irregularity proximally, and mild to moderate circumferential stenosis distally proximal to the basilar tip (series 12, image 23). The SCA and PCA origins remain patent. There is mild irregularity throughout the right PCA, but no stenosis or occlusion. Lesser left PCA irregularity. Posterior  communicating arteries are diminutive or absent. Anterior circulation: Both ICA siphons are patent. There is mild irregularity throughout both siphons with intermittent calcified plaque. No significant siphon stenosis. Normal ophthalmic artery origins. Patent carotid termini. Patent MCA and ACA origins. The proximal ACA is are within normal limits. There is mild distal ACA irregularity. The left MCA M1 segment is mildly irregular and bifurcates early. No significant stenosis or left MCA branch occlusion identified. Mild left MCA branch irregularity. The right MCA is patent without stenosis. There is moderate irregularity and stenosis at the origin of the dominant right M2 posterior branch (series 11, image 15. Mild right MCA branch irregularity otherwise. Venous sinuses: Patent on the delayed images. Anatomic variants: Mildly dominant right vertebral artery. Delayed phase: No abnormal enhancement identified. Stable gray-white matter differentiation throughout the brain. Review of the MIP images confirms the above findings IMPRESSION: 1. Negative for large vessel occlusion. 2. Positive for widespread atherosclerosis in the head, neck, and chest. Abundant soft plaque of the aortic arch and the proximal great vessels. 3. No hemodynamically significant stenosis in the neck, but Moderate intracranial stenoses: - distal Left vertebral artery. - distal 3rd Basilar Artery proximal to the SCA origins. - Right MCA posterior M2 branch origin. 4.  Stable CT appearance of the brain since 1250 hours today. 5. Nonspecific mediastinal lymphadenopathy is partially visible. RECOMMEND routine follow-up Chest CT (IV contrast preferred) further characterize. Electronically Signed   By: Genevie Ann M.D.   On: 12/05/2017 18:55   Ct Head Wo Contrast  Result Date: 12/05/2017 CLINICAL DATA:  Dizziness and difficulty walking beginning this morning. Normal last night. EXAM: CT HEAD WITHOUT CONTRAST TECHNIQUE: Contiguous axial images were  obtained from the base of the skull through the vertex without intravenous contrast. COMPARISON:  CT and MRI 10/26/2017 FINDINGS: Brain:  Generalized atrophy. Chronic small-vessel changes affecting the pons. Old small vessel infarctions affecting the thalami and basal ganglia. Chronic small-vessel ischemic changes of the white matter. No sign of acute infarction, mass lesion, hemorrhage, hydrocephalus or extra-axial collection. Vascular: There is atherosclerotic calcification of the major vessels at the base of the brain. Skull: Negative Sinuses/Orbits: Clear/normal Other: None IMPRESSION: No acute finding by CT. Extensive old ischemic changes throughout the brain as outlined above. Electronically Signed   By: Nelson Chimes M.D.   On: 12/05/2017 13:03   Ct Angio Neck W Or Wo Contrast  Result Date: 12/05/2017 CLINICAL DATA:  57 year old female with acute and subacute on chronic small vessel infarcts demonstrated on MRI today performed for dizziness and difficulty walking. EXAM: CT ANGIOGRAPHY HEAD AND NECK TECHNIQUE: Multidetector CT imaging of the head and neck was performed using the standard protocol during bolus administration of intravenous contrast. Multiplanar CT image reconstructions and MIPs were obtained to evaluate the vascular anatomy. Carotid stenosis measurements (when applicable) are obtained utilizing NASCET criteria, using the distal internal carotid diameter as the denominator. CONTRAST:  80mL ISOVUE-370 IOPAMIDOL (ISOVUE-370) INJECTION 76% COMPARISON:  Brain MRI 1659 hours today. FINDINGS: CTA NECK Skeleton: Occasional carious dentition. No acute osseous abnormality identified. Upper chest: Negative lung apices, but abnormally increased mediastinal lymph nodes, such as the anterior carina node on series 5, image 167. There may also be increased hilar nodes. Other neck: The glottis is closed.  No neck mass or lymphadenopathy. Aortic arch: 3 vessel arch configuration. Abundant soft plaque of the  arch. Minimal calcified plaque probably just proximal to the brachiocephalic artery origin rather than ulcerated plaque (series 8, image 131). Right carotid system: No right CCA origin stenosis despite soft plaque. Minimal plaque at the right carotid bifurcation. No cervical right ICA stenosis, but the vessel is tortuous just below the skull base. Left carotid system: No stenosis despite soft plaque at the left CCA origin. Tortuous proximal left CCA. Soft plaque at the left ICA origin and bulb but no stenosis. Tortuous left ICA just below the skull base. Vertebral arteries: No proximal right subclavian artery or right vertebral artery origin stenosis despite soft plaque. The right vertebral artery is dominant and tortuous in the neck but patent to the skull base without stenosis. Abundant proximal left subclavian soft plaques similar to that in the arch. Focal low-density plaque is evident on series 5, image 145. Still there is no hemodynamically significant left subclavian stenosis. Likewise, only mild left vertebral artery origin stenosis due to soft plaque. The left vertebral artery is non dominant and tortuous in the neck. No cervical left vertebral stenosis. CTA HEAD Posterior circulation: Mildly dominant distal right vertebral artery without stenosis. Dominant right AICA. Patent distal left vertebral artery with moderate stenosis affecting a long segment of the distal V4 just proximal to the vertebrobasilar junction (series 7, image 132). The left PICA origin is patent. Patent basilar artery with mild irregularity proximally, and mild to moderate circumferential stenosis distally proximal to the basilar tip (series 12, image 23). The SCA and PCA origins remain patent. There is mild irregularity throughout the right PCA, but no stenosis or occlusion. Lesser left PCA irregularity. Posterior communicating arteries are diminutive or absent. Anterior circulation: Both ICA siphons are patent. There is mild  irregularity throughout both siphons with intermittent calcified plaque. No significant siphon stenosis. Normal ophthalmic artery origins. Patent carotid termini. Patent MCA and ACA origins. The proximal ACA is are within normal limits. There is mild distal ACA irregularity.  The left MCA M1 segment is mildly irregular and bifurcates early. No significant stenosis or left MCA branch occlusion identified. Mild left MCA branch irregularity. The right MCA is patent without stenosis. There is moderate irregularity and stenosis at the origin of the dominant right M2 posterior branch (series 11, image 15. Mild right MCA branch irregularity otherwise. Venous sinuses: Patent on the delayed images. Anatomic variants: Mildly dominant right vertebral artery. Delayed phase: No abnormal enhancement identified. Stable gray-white matter differentiation throughout the brain. Review of the MIP images confirms the above findings IMPRESSION: 1. Negative for large vessel occlusion. 2. Positive for widespread atherosclerosis in the head, neck, and chest. Abundant soft plaque of the aortic arch and the proximal great vessels. 3. No hemodynamically significant stenosis in the neck, but Moderate intracranial stenoses: - distal Left vertebral artery. - distal 3rd Basilar Artery proximal to the SCA origins. - Right MCA posterior M2 branch origin. 4.  Stable CT appearance of the brain since 1250 hours today. 5. Nonspecific mediastinal lymphadenopathy is partially visible. RECOMMEND routine follow-up Chest CT (IV contrast preferred) further characterize. Electronically Signed   By: Genevie Ann M.D.   On: 12/05/2017 18:55   Mr Brain Wo Contrast  Result Date: 12/05/2017 CLINICAL DATA:  57 year old female with dizziness, difficulty walking beginning this morning. EXAM: MRI HEAD WITHOUT CONTRAST TECHNIQUE: Multiplanar, multiecho pulse sequences of the brain and surrounding structures were obtained without intravenous contrast. COMPARISON:  Head CT  without contrast 1250 hours today. Brain MRI 10/26/2017, and earlier. FINDINGS: Brain: There are 2 small areas of abnormal diffusion in the left frontal lobe. A nodular focus of restriction in the left periventricular white matter along the anterior left frontal horn is noted on series 501, image 58 (ADC map image 22). A patchy curvilinear area in the left corona radiata appears only partially restricted (image 57). Both there is demonstrate T2 and FLAIR hyperintensity. No associated hemorrhage or mass effect. No other No restricted diffusion or evidence of acute infarction. Abnormal T2 and FLAIR signal in the pons, throughout the bilateral deep gray matter nuclei, and cerebral white matter most resembling advanced chronic small vessel ischemia. Focal involvement of the deep white matter capsules, and also the anterior body of the corpus callosum which is abnormally thinned (series 7001, image 12), and mild chronic blood products also noted at this site (series 11001, image 40). Chronic hemorrhage also in the left deep gray matter nuclei and lower brainstem (image 15). The cerebellar hemispheres appear spared. No cerebral cortical encephalomalacia is identified. No midline shift, mass effect, evidence of mass lesion, ventriculomegaly, extra-axial collection or acute intracranial hemorrhage. Cervicomedullary junction and pituitary are within normal limits. Vascular: Major intracranial vascular flow voids are stable. Skull and upper cervical spine: Negative visible cervical spine. Visualized bone marrow signal is within normal limits. Sinuses/Orbits: Stable and negative. Other: Mastoid air cells remain clear. Grossly normal visible internal auditory structures. Negative scalp and face soft tissues. IMPRESSION: 1. Small acute lacunar infarct along the left frontal horn periventricular white matter. Small subacute lacunar infarct in the left posterior corona radiata. No acute hemorrhage or mass effect. 2. Underlying  very advanced chronic small vessel disease redemonstrated. Numerous chronic lacunar infarcts in the deep gray matter nuclei, brainstem, and cerebral white matter including the corpus callosum. Electronically Signed   By: Genevie Ann M.D.   On: 12/05/2017 17:34    Physical Exam:    Middle-aged African-American lady currently not in distress. . Afebrile. Head is nontraumatic. Neck is supple without  bruit.    Cardiac exam no murmur or gallop. Lungs are clear to auscultation. Distal pulses are well felt. Neurological Exam : Awake alert follows simple questions but speech is severely dysarthric and can speak only a few words.Follows commands well. Left gaze preference but able to look past midline. Slight restriction of vertical upgaze. Blinks to threat bilaterally. Right lower facial weakness. Tongue midline. Dense right hemiplegia with 0/5 right upper extremity and 1-2/5 right lower extremity strength. Good antigravity strength on the left side. Sensation is diminished on the right hemibody compared to the left. Coordination present on the left cannot be tested on the right due to weakness. Right plantar upgoing left downgoing. Gait not tested. ASSESSMENT Kelly Gibson is a 57 y.o. female with  Prior history of left brain infarct in March 2019 with residual right) presented with worsening of right hemiplegia with MRI scan showing 2 new lacunar infarcts in the left periventricular white matter and corona radiata.She resides subjective neurological decline this morning since admission. Hospital day # 1  TREATMENT/PLAN  Recommend keep the patient flat in bed today. IV hydration with normal saline. Keep patient in them out to evaluated by speech therapist. Continue aspirin  and aggressive risk factor modification.I will discussion with patient and her RN at the bedside and answered questions  Greater than 50% time during this 35 minute visit was spent on counseling and coordination of care about a lacunar  infarct and neurological worsening and answered questions. Discussed with Dr. Theresa Duty, MD [facility] Pager: [phone] 12/06/2017 1:56 PM

## 2017-12-06 NOTE — Evaluation (Signed)
Clinical/Bedside Swallow Evaluation Patient Details  Name: Kelly Gibson MRN: [id number] Date of Birth: 1960-10-31  Today's Date: 12/06/2017 Time: SLP Start Time (ACUTE ONLY): 1194 SLP Stop Time (ACUTE ONLY): 1547 SLP Time Calculation (min) (ACUTE ONLY): 16 min  Past Medical History:  Past Medical History:  Diagnosis Date  . COPD (chronic obstructive pulmonary disease) ([place])   . Depression   . Diabetes mellitus   . Hyperlipidemia   . Hypertension    Past Surgical History:  Past Surgical History:  Procedure Laterality Date  . CESAREAN SECTION     x 3  . TUMOR REMOVAL     left shoulder   HPI:  Pt is a 57 y.o.femalewith prior history of left brain infarct in March 2019 with residual mild right-sided weakness/numbness who presented with worsening of right hemiplegia. MRI showed 2 new lacunar infarcts in the left periventricular white matter and corona radiata. She has additional PMH of COPD, depression, DM2, HLD, HTN. Pt initially passed the stroke swallow screen but on the morning of 5/13 she had a worsening of her neurological symptoms, including increased dysarthria and trouble swallowing.    Assessment / Plan / Recommendation Clinical Impression  Pt has moderate facial weakness and persistent oral movements at baseline that appear to be like mastication although she does not have anything in her mouth. She does not open her mouth very wide upon command and does not follow all commands consistently. She has difficulty with oral acceptance due to reduced awareness, facial weakness, and involuntary movements. Oral transit is delayed particularly with purees, but she does appear to consistently trigger a swallow response. Intermittent throat clearing was noted post-swallow with ice chips and purees. Given overall presentation and concern for neurological worsening throughout the day, would favor being more conservative and keeping pt NPO for now. Will f/u on next date to assess for  readiness for POs versus instrumental testing if she remains neurologically more stable. SLP Visit Diagnosis: Dysphagia, oral phase (R13.11)    Aspiration Risk  Moderate aspiration risk    Diet Recommendation NPO   Medication Administration: Via alternative means    Other  Recommendations Oral Care Recommendations: Oral care QID   Follow up Recommendations (tba)      Frequency and Duration min 2x/week  2 weeks       Prognosis Prognosis for Safe Diet Advancement: Good      Swallow Study   General HPI: Pt is a 58 y.o.femalewith prior history of left brain infarct in March 2019 with residual mild right-sided weakness/numbness who presented with worsening of right hemiplegia. MRI showed 2 new lacunar infarcts in the left periventricular white matter and corona radiata. She has additional PMH of COPD, depression, DM2, HLD, HTN. Pt initially passed the stroke swallow screen but on the morning of 5/13 she had a worsening of her neurological symptoms, including increased dysarthria and trouble swallowing.  Type of Study: Bedside Swallow Evaluation Previous Swallow Assessment: none in chart Diet Prior to this Study: NPO Temperature Spikes Noted: No Respiratory Status: Room air History of Recent Intubation: No Behavior/Cognition: Lethargic/Drowsy;Requires cueing Oral Cavity Assessment: Within Functional Limits Oral Care Completed by SLP: No Self-Feeding Abilities: Needs assist Patient Positioning: Upright in bed Baseline Vocal Quality: Low vocal intensity;Other (comment)(speech dysarthric) Volitional Cough: Weak Volitional Swallow: Able to elicit    Oral/Motor/Sensory Function Overall Oral Motor/Sensory Function: Moderate impairment(assessment limited by command following) Facial Symmetry: Abnormal symmetry right;Suspected CN VII (facial) dysfunction Facial Strength: Reduced right;Suspected CN VII (facial) dysfunction Lingual Symmetry:  Within Functional Limits   Ice Chips Ice  chips: Impaired Presentation: Spoon Oral Phase Impairments: Poor awareness of bolus Pharyngeal Phase Impairments: Throat Clearing - Immediate   Thin Liquid Thin Liquid: Impaired Presentation: Cup;Spoon Oral Phase Impairments: Poor awareness of bolus    Nectar Thick Nectar Thick Liquid: Not tested   Honey Thick Honey Thick Liquid: Not tested   Puree Puree: Impaired Presentation: Spoon Oral Phase Impairments: Poor awareness of bolus Oral Phase Functional Implications: Prolonged oral transit Pharyngeal Phase Impairments: Throat Clearing - Immediate   Solid   GO   Solid: Not tested        Germain Osgood 12/06/2017,4:45 PM  Germain Osgood, M.A. [name] [phone]

## 2017-12-07 ENCOUNTER — Inpatient Hospital Stay (HOSPITAL_COMMUNITY): Payer: BLUE CROSS/BLUE SHIELD

## 2017-12-07 DIAGNOSIS — R634 Abnormal weight loss: Secondary | ICD-10-CM

## 2017-12-07 LAB — BASIC METABOLIC PANEL
Anion gap: 13 (ref 5–15)
BUN: 11 mg/dL (ref 6–20)
CALCIUM: 9.2 mg/dL (ref 8.9–10.3)
CO2: 22 mmol/L (ref 22–32)
CREATININE: 0.8 mg/dL (ref 0.44–1.00)
Chloride: 106 mmol/L (ref 101–111)
GFR calc non Af Amer: >60 mL/min (ref >60)
Glucose, Bld: 86 mg/dL (ref 65–99)
Potassium: 3.1 mmol/L — ABNORMAL LOW (ref 3.5–5.1)
SODIUM: 141 mmol/L (ref 135–145)

## 2017-12-07 LAB — GLUCOSE, CAPILLARY
GLUCOSE-CAPILLARY: 75 mg/dL (ref 65–99)
GLUCOSE-CAPILLARY: 66 mg/dL (ref 65–99)
Glucose-Capillary: 100 mg/dL — ABNORMAL HIGH (ref 65–99)

## 2017-12-07 LAB — CBC
HCT: 36.9 % (ref 36.0–46.0)
Hemoglobin: 12.5 g/dL (ref 12.0–15.0)
MCH: 28 pg (ref 26.0–34.0)
MCHC: 33.9 g/dL (ref 30.0–36.0)
MCV: 82.6 fL (ref 78.0–100.0)
Platelets: 388 10*3/uL (ref 150–400)
RBC: 4.47 MIL/uL (ref 3.87–5.11)
RDW: 13.6 % (ref 11.5–15.5)
WBC: 4.6 10*3/uL (ref 4.0–10.5)

## 2017-12-07 LAB — MAGNESIUM: Magnesium: 1.7 mg/dL (ref 1.7–2.4)

## 2017-12-07 MED ORDER — ORAL CARE MOUTH RINSE
15.0000 mL | Freq: Two times a day (BID) | OROMUCOSAL | Status: DC
  Administered 2017-12-07 – 2017-12-08 (×2): 15 mL via OROMUCOSAL

## 2017-12-07 MED ORDER — POTASSIUM CHLORIDE 10 MEQ/100ML IV SOLN
10.0000 meq | INTRAVENOUS | Status: AC
  Administered 2017-12-07 (×3): 10 meq via INTRAVENOUS
  Filled 2017-12-07 (×3): qty 100

## 2017-12-07 MED ORDER — MAGNESIUM SULFATE 2 GM/50ML IV SOLN
2.0000 g | Freq: Once | INTRAVENOUS | Status: AC
  Administered 2017-12-07: 2 g via INTRAVENOUS
  Filled 2017-12-07: qty 50

## 2017-12-07 MED ORDER — DEXTROSE 50 % IV SOLN
INTRAVENOUS | Status: AC
  Administered 2017-12-07: 25 mL
  Filled 2017-12-07: qty 50

## 2017-12-07 NOTE — Progress Notes (Signed)
Modified Barium Swallow Progress Note  Patient Details  Name: Kelly Gibson MRN: [id number] Date of Birth: 02-Mar-1961  Today's Date: 12/07/2017  Modified Barium Swallow completed.  Full report located under Chart Review in the Imaging Section.  Brief recommendations include the following:  Clinical Impression  Pt has a primary oral dypshagia marked by weak labial seal and lingual manipulation. With cup sips of thin liquids she has anterior spillage and oral holding, before allowing liquids to spill passively into the pharynx. Small amounts of premature spillage are noted during oral holding. Pt has improved control and less delay in transit when using a straw. Although no aspiration was seen on this test, question if coughing on cup sips of thin liquid this morning could have been related to premature spillage reaching further into her larynx. Pt's oral preparation and transit with purees and solids is also delayed and somewhat labored, although with only mild residue remaining orally. Recommend that pt start Dys 2 diet and thin liquids by straw. SLP will follow for tolerance and readiness to advance with improved awareness and self-management of right-sided loss and pocketing.   Swallow Evaluation Recommendations       SLP Diet Recommendations: Dysphagia 2 (Fine chop) solids;Thin liquid   Liquid Administration via: Straw   Medication Administration: Whole meds with puree   Supervision: Staff to assist with self feeding;Full supervision/cueing for compensatory strategies   Compensations: Slow rate;Small sips/bites;Lingual sweep for clearance of pocketing;Monitor for anterior loss   Postural Changes: Seated upright at 90 degrees   Oral Care Recommendations: Oral care BID        Germain Osgood 12/07/2017,2:12 PM   Germain Osgood, M.A. [name] [phone]

## 2017-12-07 NOTE — Evaluation (Signed)
Speech Language Pathology Evaluation Patient Details Name: Kelly Gibson MRN: [id number] DOB: 1960/08/07 Today's Date: 12/07/2017 Time: 9924-2683 SLP Time Calculation (min) (ACUTE ONLY): 21 min  Problem List:  Patient Active Problem List   Diagnosis Date Noted  . Acute CVA (cerebrovascular accident) ([place]) 12/05/2017  . Bradycardia   . Uncontrolled hypertension   . Weakness 10/26/2017  . Cerebrovascular disease 10/18/2017  . COPD (chronic obstructive pulmonary disease) ([place]) 02/18/2015  . Loss of weight 03/15/2014  . Non compliance w medication regimen 06/17/2013  . Post-menopause bleeding 06/16/2013  . Bronchitis 05/04/2012  . Tobacco user 05/04/2012  . Ganglion cyst 03/09/2012  . Anxiety 03/09/2012  . Essential hypertension, benign 12/17/2011  . Obesity, unspecified 12/17/2011  . Hyperlipidemia 12/17/2011  . Depression 12/17/2011  . Diabetes mellitus type II, uncontrolled ([place]) 12/14/2011   Past Medical History:  Past Medical History:  Diagnosis Date  . COPD (chronic obstructive pulmonary disease) ([place])   . Depression   . Diabetes mellitus   . Hyperlipidemia   . Hypertension    Past Surgical History:  Past Surgical History:  Procedure Laterality Date  . CESAREAN SECTION     x 3  . TUMOR REMOVAL     left shoulder   HPI:  Pt is a 57 y.o.femalewith prior history of left brain infarct in March 2019 with residual mild right-sided weakness/numbness who presented with worsening of right hemiplegia. MRI showed 2 new lacunar infarcts in the left periventricular white matter and corona radiata. She has additional PMH of COPD, depression, DM2, HLD, HTN. Pt initially passed the stroke swallow screen but on the morning of 5/13 she had a worsening of her neurological symptoms, including increased dysarthria and trouble swallowing.    Assessment / Plan / Recommendation Clinical Impression  Pt is moderately dysarthric, impacting intelligibility at all levels of communication.  SLP provided education about intelligibility strategies and gave Mod cues for use at the sentence level. Pt has difficulty with selective attention in a mildly distracting environment, which also likely contributed to her decreased storage and retrieval of new information. She recalled 3 out of 4 words during delayed recall task given Mod cues. Pt will benefit from continued SLP f/u to maximize communication and functional cognition.    SLP Assessment  SLP Recommendation/Assessment: Patient needs continued Speech Lanaguage Pathology Services SLP Visit Diagnosis: Dysarthria and anarthria (R47.1);Cognitive communication deficit (R41.841)    Follow Up Recommendations  Inpatient Rehab    Frequency and Duration min 2x/week  2 weeks      SLP Evaluation Cognition  Overall Cognitive Status: Impaired/Different from baseline Arousal/Alertness: Awake/alert Orientation Level: Oriented to person;Oriented to place;Oriented to situation;Oriented to time Attention: Selective Selective Attention: Impaired Selective Attention Impairment: Verbal basic;Functional basic Memory: Impaired Memory Impairment: Storage deficit;Retrieval deficit;Decreased recall of new information Awareness: Impaired Awareness Impairment: Emergent impairment;Intellectual impairment Problem Solving: Impaired Problem Solving Impairment: Verbal basic;Functional basic       Comprehension  Auditory Comprehension Overall Auditory Comprehension: Appears within functional limits for tasks assessed    Expression Expression Primary Mode of Expression: Verbal Verbal Expression Overall Verbal Expression: Impaired Initiation: No impairment Level of Generative/Spontaneous Verbalization: Sentence Pragmatics: No impairment Interfering Components: Attention Non-Verbal Means of Communication: Not applicable Other Verbal Expression Comments: difficulty with verbal sequencing   Oral / Motor  Oral Motor/Sensory Function Overall Oral  Motor/Sensory Function: Severe impairment(mod-severe; better assessed than on previous date) Facial ROM: Reduced right;Suspected CN VII (facial) dysfunction Facial Symmetry: Abnormal symmetry right;Suspected CN VII (facial) dysfunction Facial Strength: Reduced right;Suspected  CN VII (facial) dysfunction Facial Sensation: Reduced right;Suspected CN V (Trigeminal) dysfunction Lingual ROM: Within Functional Limits Lingual Symmetry: Abnormal symmetry right;Suspected CN XII (hypoglossal) dysfunction(deviates to R wtih full protrusion) Velum: (difficult to visualize) Motor Speech Overall Motor Speech: Impaired Respiration: Within functional limits Phonation: Low vocal intensity Articulation: Impaired Level of Impairment: Word Intelligibility: Intelligibility reduced Word: 50-74% accurate Phrase: 50-74% accurate Sentence: 50-74% accurate Motor Planning: Witnin functional limits Effective Techniques: Increased vocal intensity;Over-articulate   GO                    Germain Osgood 12/07/2017, 9:40 AM  Germain Osgood, M.A. [name] [phone]

## 2017-12-07 NOTE — Progress Notes (Signed)
Stroke Team Progress Note      SUBJECTIVE   Patient's daughter is at the bedside. Patient has shown some improvement in her speech and swallowing but still has persistent dense right hemiplegia. Repeat CT scan of the head yesterday did not show any new or unexpected findings  OBJECTIVE Most recent Vital Signs: Temp: 97.9 F (36.6 C) (05/14 1038) Temp Source: Oral (05/14 1038) BP: 172/79 (05/14 1038) Pulse Rate: 49 (05/14 1038) Respiratory Rate: 16 O2 Saturdation: 100%  CBG (last 3)  Recent Labs    12/06/17 2153 12/07/17 0608 12/07/17 1217  GLUCAP 69 75 66    Diet:  Diet Order           DIET DYS 2 Room service appropriate? Yes; Fluid consistency: Thin  Diet effective now          Activity: bedrest VTE Prophylaxis:  SCDs Studies: Results for orders placed or performed during the hospital encounter of 12/05/17 (from the past 24 hour(s))  Glucose, capillary     Status: None   Collection Time: 12/06/17  4:58 PM  Result Value Ref Range   Glucose-Capillary 79 65 - 99 mg/dL   Comment 1 Notify RN    Comment 2 Document in Chart   Magnesium     Status: None   Collection Time: 12/06/17  6:18 PM  Result Value Ref Range   Magnesium 1.7 1.7 - 2.4 mg/dL  Basic metabolic panel     Status: Abnormal   Collection Time: 12/06/17  6:18 PM  Result Value Ref Range   Sodium 141 135 - 145 mmol/L   Potassium 3.2 (L) 3.5 - 5.1 mmol/L   Chloride 106 101 - 111 mmol/L   CO2 22 22 - 32 mmol/L   Glucose, Bld 79 65 - 99 mg/dL   BUN 11 6 - 20 mg/dL   Creatinine, Ser 0.80 0.44 - 1.00 mg/dL   Calcium 9.2 8.9 - 10.3 mg/dL   GFR calc non Af Amer >60 >60 mL/min   GFR calc Af Amer >60 >60 mL/min   Anion gap 13 5 - 15  Glucose, capillary     Status: None   Collection Time: 12/06/17  9:45 PM  Result Value Ref Range   Glucose-Capillary 75 65 - 99 mg/dL  Glucose, capillary     Status: None   Collection Time: 12/06/17  9:53 PM  Result Value Ref Range   Glucose-Capillary 69 65 - 99 mg/dL   Glucose, capillary     Status: None   Collection Time: 12/07/17  6:08 AM  Result Value Ref Range   Glucose-Capillary 75 65 - 99 mg/dL   Comment 1 Notify RN    Comment 2 Document in Chart   CBC     Status: None   Collection Time: 12/07/17  7:17 AM  Result Value Ref Range   WBC 4.6 4.0 - 10.5 K/uL   RBC 4.47 3.87 - 5.11 MIL/uL   Hemoglobin 12.5 12.0 - 15.0 g/dL   HCT 36.9 36.0 - 46.0 %   MCV 82.6 78.0 - 100.0 fL   MCH 28.0 26.0 - 34.0 pg   MCHC 33.9 30.0 - 36.0 g/dL   RDW 13.6 11.5 - 15.5 %   Platelets 388 150 - 400 K/uL  Basic metabolic panel     Status: Abnormal   Collection Time: 12/07/17  7:17 AM  Result Value Ref Range   Sodium 141 135 - 145 mmol/L   Potassium 3.1 (L) 3.5 - 5.1 mmol/L  Chloride 106 101 - 111 mmol/L   CO2 22 22 - 32 mmol/L   Glucose, Bld 86 65 - 99 mg/dL   BUN 11 6 - 20 mg/dL   Creatinine, Ser 0.80 0.44 - 1.00 mg/dL   Calcium 9.2 8.9 - 10.3 mg/dL   GFR calc non Af Amer >60 >60 mL/min   GFR calc Af Amer >60 >60 mL/min   Anion gap 13 5 - 15  Magnesium     Status: None   Collection Time: 12/07/17  7:17 AM  Result Value Ref Range   Magnesium 1.7 1.7 - 2.4 mg/dL  Glucose, capillary     Status: None   Collection Time: 12/07/17 12:17 PM  Result Value Ref Range   Glucose-Capillary 66 65 - 99 mg/dL   Comment 1 Notify RN    Comment 2 Document in Chart      Ct Angio Head W Or Wo Contrast  Result Date: 12/05/2017 CLINICAL DATA:  57 year old female with acute and subacute on chronic small vessel infarcts demonstrated on MRI today performed for dizziness and difficulty walking. EXAM: CT ANGIOGRAPHY HEAD AND NECK TECHNIQUE: Multidetector CT imaging of the head and neck was performed using the standard protocol during bolus administration of intravenous contrast. Multiplanar CT image reconstructions and MIPs were obtained to evaluate the vascular anatomy. Carotid stenosis measurements (when applicable) are obtained utilizing NASCET criteria, using the distal  internal carotid diameter as the denominator. CONTRAST:  32mL ISOVUE-370 IOPAMIDOL (ISOVUE-370) INJECTION 76% COMPARISON:  Brain MRI 1659 hours today. FINDINGS: CTA NECK Skeleton: Occasional carious dentition. No acute osseous abnormality identified. Upper chest: Negative lung apices, but abnormally increased mediastinal lymph nodes, such as the anterior carina node on series 5, image 167. There may also be increased hilar nodes. Other neck: The glottis is closed.  No neck mass or lymphadenopathy. Aortic arch: 3 vessel arch configuration. Abundant soft plaque of the arch. Minimal calcified plaque probably just proximal to the brachiocephalic artery origin rather than ulcerated plaque (series 8, image 131). Right carotid system: No right CCA origin stenosis despite soft plaque. Minimal plaque at the right carotid bifurcation. No cervical right ICA stenosis, but the vessel is tortuous just below the skull base. Left carotid system: No stenosis despite soft plaque at the left CCA origin. Tortuous proximal left CCA. Soft plaque at the left ICA origin and bulb but no stenosis. Tortuous left ICA just below the skull base. Vertebral arteries: No proximal right subclavian artery or right vertebral artery origin stenosis despite soft plaque. The right vertebral artery is dominant and tortuous in the neck but patent to the skull base without stenosis. Abundant proximal left subclavian soft plaques similar to that in the arch. Focal low-density plaque is evident on series 5, image 145. Still there is no hemodynamically significant left subclavian stenosis. Likewise, only mild left vertebral artery origin stenosis due to soft plaque. The left vertebral artery is non dominant and tortuous in the neck. No cervical left vertebral stenosis. CTA HEAD Posterior circulation: Mildly dominant distal right vertebral artery without stenosis. Dominant right AICA. Patent distal left vertebral artery with moderate stenosis affecting a long  segment of the distal V4 just proximal to the vertebrobasilar junction (series 7, image 132). The left PICA origin is patent. Patent basilar artery with mild irregularity proximally, and mild to moderate circumferential stenosis distally proximal to the basilar tip (series 12, image 23). The SCA and PCA origins remain patent. There is mild irregularity throughout the right PCA, but no stenosis  or occlusion. Lesser left PCA irregularity. Posterior communicating arteries are diminutive or absent. Anterior circulation: Both ICA siphons are patent. There is mild irregularity throughout both siphons with intermittent calcified plaque. No significant siphon stenosis. Normal ophthalmic artery origins. Patent carotid termini. Patent MCA and ACA origins. The proximal ACA is are within normal limits. There is mild distal ACA irregularity. The left MCA M1 segment is mildly irregular and bifurcates early. No significant stenosis or left MCA branch occlusion identified. Mild left MCA branch irregularity. The right MCA is patent without stenosis. There is moderate irregularity and stenosis at the origin of the dominant right M2 posterior branch (series 11, image 15. Mild right MCA branch irregularity otherwise. Venous sinuses: Patent on the delayed images. Anatomic variants: Mildly dominant right vertebral artery. Delayed phase: No abnormal enhancement identified. Stable gray-white matter differentiation throughout the brain. Review of the MIP images confirms the above findings IMPRESSION: 1. Negative for large vessel occlusion. 2. Positive for widespread atherosclerosis in the head, neck, and chest. Abundant soft plaque of the aortic arch and the proximal great vessels. 3. No hemodynamically significant stenosis in the neck, but Moderate intracranial stenoses: - distal Left vertebral artery. - distal 3rd Basilar Artery proximal to the SCA origins. - Right MCA posterior M2 branch origin. 4.  Stable CT appearance of the brain  since 1250 hours today. 5. Nonspecific mediastinal lymphadenopathy is partially visible. RECOMMEND routine follow-up Chest CT (IV contrast preferred) further characterize. Electronically Signed   By: Genevie Ann M.D.   On: 12/05/2017 18:55   Ct Head Wo Contrast  Result Date: 12/07/2017 CLINICAL DATA:  Stroke follow-up EXAM: CT HEAD WITHOUT CONTRAST TECHNIQUE: Contiguous axial images were obtained from the base of the skull through the vertex without intravenous contrast. COMPARISON:  12/05/2017 FINDINGS: Brain: Known acute infarct in the left basal ganglia and left genu of the corpus callosum, the latter very subtle by CT. There is a background of extensive chronic small vessel ischemic injury in the deep gray nuclei and cerebral white matter. Atrophy greatest in the frontal lobes. No hemorrhage, hydrocephalus, or evidence of interval infarct. Vascular: Atherosclerotic calcification.  No hyperdense vessel Skull: No acute or aggressive finding Sinuses/Orbits: Negative IMPRESSION: 1. Known acute small-vessel infarcts. No hemorrhage or detectable progression when compared to brain MRI yesterday. 2. Advanced chronic small vessel disease. Electronically Signed   By: Monte Fantasia M.D.   On: 12/07/2017 10:15   Ct Angio Neck W Or Wo Contrast  Result Date: 12/05/2017 CLINICAL DATA:  57 year old female with acute and subacute on chronic small vessel infarcts demonstrated on MRI today performed for dizziness and difficulty walking. EXAM: CT ANGIOGRAPHY HEAD AND NECK TECHNIQUE: Multidetector CT imaging of the head and neck was performed using the standard protocol during bolus administration of intravenous contrast. Multiplanar CT image reconstructions and MIPs were obtained to evaluate the vascular anatomy. Carotid stenosis measurements (when applicable) are obtained utilizing NASCET criteria, using the distal internal carotid diameter as the denominator. CONTRAST:  97mL ISOVUE-370 IOPAMIDOL (ISOVUE-370) INJECTION 76%  COMPARISON:  Brain MRI 1659 hours today. FINDINGS: CTA NECK Skeleton: Occasional carious dentition. No acute osseous abnormality identified. Upper chest: Negative lung apices, but abnormally increased mediastinal lymph nodes, such as the anterior carina node on series 5, image 167. There may also be increased hilar nodes. Other neck: The glottis is closed.  No neck mass or lymphadenopathy. Aortic arch: 3 vessel arch configuration. Abundant soft plaque of the arch. Minimal calcified plaque probably just proximal to the brachiocephalic artery  origin rather than ulcerated plaque (series 8, image 131). Right carotid system: No right CCA origin stenosis despite soft plaque. Minimal plaque at the right carotid bifurcation. No cervical right ICA stenosis, but the vessel is tortuous just below the skull base. Left carotid system: No stenosis despite soft plaque at the left CCA origin. Tortuous proximal left CCA. Soft plaque at the left ICA origin and bulb but no stenosis. Tortuous left ICA just below the skull base. Vertebral arteries: No proximal right subclavian artery or right vertebral artery origin stenosis despite soft plaque. The right vertebral artery is dominant and tortuous in the neck but patent to the skull base without stenosis. Abundant proximal left subclavian soft plaques similar to that in the arch. Focal low-density plaque is evident on series 5, image 145. Still there is no hemodynamically significant left subclavian stenosis. Likewise, only mild left vertebral artery origin stenosis due to soft plaque. The left vertebral artery is non dominant and tortuous in the neck. No cervical left vertebral stenosis. CTA HEAD Posterior circulation: Mildly dominant distal right vertebral artery without stenosis. Dominant right AICA. Patent distal left vertebral artery with moderate stenosis affecting a long segment of the distal V4 just proximal to the vertebrobasilar junction (series 7, image 132). The left PICA  origin is patent. Patent basilar artery with mild irregularity proximally, and mild to moderate circumferential stenosis distally proximal to the basilar tip (series 12, image 23). The SCA and PCA origins remain patent. There is mild irregularity throughout the right PCA, but no stenosis or occlusion. Lesser left PCA irregularity. Posterior communicating arteries are diminutive or absent. Anterior circulation: Both ICA siphons are patent. There is mild irregularity throughout both siphons with intermittent calcified plaque. No significant siphon stenosis. Normal ophthalmic artery origins. Patent carotid termini. Patent MCA and ACA origins. The proximal ACA is are within normal limits. There is mild distal ACA irregularity. The left MCA M1 segment is mildly irregular and bifurcates early. No significant stenosis or left MCA branch occlusion identified. Mild left MCA branch irregularity. The right MCA is patent without stenosis. There is moderate irregularity and stenosis at the origin of the dominant right M2 posterior branch (series 11, image 15. Mild right MCA branch irregularity otherwise. Venous sinuses: Patent on the delayed images. Anatomic variants: Mildly dominant right vertebral artery. Delayed phase: No abnormal enhancement identified. Stable gray-white matter differentiation throughout the brain. Review of the MIP images confirms the above findings IMPRESSION: 1. Negative for large vessel occlusion. 2. Positive for widespread atherosclerosis in the head, neck, and chest. Abundant soft plaque of the aortic arch and the proximal great vessels. 3. No hemodynamically significant stenosis in the neck, but Moderate intracranial stenoses: - distal Left vertebral artery. - distal 3rd Basilar Artery proximal to the SCA origins. - Right MCA posterior M2 branch origin. 4.  Stable CT appearance of the brain since 1250 hours today. 5. Nonspecific mediastinal lymphadenopathy is partially visible. RECOMMEND routine  follow-up Chest CT (IV contrast preferred) further characterize. Electronically Signed   By: Genevie Ann M.D.   On: 12/05/2017 18:55   Mr Brain Wo Contrast  Result Date: 12/05/2017 CLINICAL DATA:  57 year old female with dizziness, difficulty walking beginning this morning. EXAM: MRI HEAD WITHOUT CONTRAST TECHNIQUE: Multiplanar, multiecho pulse sequences of the brain and surrounding structures were obtained without intravenous contrast. COMPARISON:  Head CT without contrast 1250 hours today. Brain MRI 10/26/2017, and earlier. FINDINGS: Brain: There are 2 small areas of abnormal diffusion in the left frontal lobe. A nodular  focus of restriction in the left periventricular white matter along the anterior left frontal horn is noted on series 501, image 58 (ADC map image 22). A patchy curvilinear area in the left corona radiata appears only partially restricted (image 57). Both there is demonstrate T2 and FLAIR hyperintensity. No associated hemorrhage or mass effect. No other No restricted diffusion or evidence of acute infarction. Abnormal T2 and FLAIR signal in the pons, throughout the bilateral deep gray matter nuclei, and cerebral white matter most resembling advanced chronic small vessel ischemia. Focal involvement of the deep white matter capsules, and also the anterior body of the corpus callosum which is abnormally thinned (series 7001, image 12), and mild chronic blood products also noted at this site (series 11001, image 40). Chronic hemorrhage also in the left deep gray matter nuclei and lower brainstem (image 15). The cerebellar hemispheres appear spared. No cerebral cortical encephalomalacia is identified. No midline shift, mass effect, evidence of mass lesion, ventriculomegaly, extra-axial collection or acute intracranial hemorrhage. Cervicomedullary junction and pituitary are within normal limits. Vascular: Major intracranial vascular flow voids are stable. Skull and upper cervical spine: Negative visible  cervical spine. Visualized bone marrow signal is within normal limits. Sinuses/Orbits: Stable and negative. Other: Mastoid air cells remain clear. Grossly normal visible internal auditory structures. Negative scalp and face soft tissues. IMPRESSION: 1. Small acute lacunar infarct along the left frontal horn periventricular white matter. Small subacute lacunar infarct in the left posterior corona radiata. No acute hemorrhage or mass effect. 2. Underlying very advanced chronic small vessel disease redemonstrated. Numerous chronic lacunar infarcts in the deep gray matter nuclei, brainstem, and cerebral white matter including the corpus callosum. Electronically Signed   By: Genevie Ann M.D.   On: 12/05/2017 17:34    Physical Exam:    Middle-aged African-American lady currently not in distress. . Afebrile. Head is nontraumatic. Neck is supple without bruit.    Cardiac exam no murmur or gallop. Lungs are clear to auscultation. Distal pulses are well felt. Neurological Exam : Awake alert follows simple questions but speech is   dysarthric and can speak hort sentences.Follows commands well. Left gaze preference but able to look past midline. Slight restriction of vertical upgaze. Blinks to threat bilaterally. Right lower facial weakness. Tongue midline. Dense right hemiplegia with 0/5 right upper extremity and 1-2/5 right lower extremity strength. Good antigravity strength on the left side. Sensation is diminished on the right hemibody compared to the left. Coordination present on the left cannot be tested on the right due to weakness. Right plantar upgoing left downgoing. Gait not tested. ASSESSMENT Ms. Ilee Randleman is a 57 y.o. female with  Prior history of left brain infarct in March 2019 with residual right) presented with worsening of right hemiplegia with MRI scan showing 2 new lacunar infarcts in the left periventricular white matter and corona radiata.She resides subjective neurological decline this morning  since admission. Hospital day # 2  TREATMENT/PLAN  mobilize out of bed. Physical occupational therapy and rehabilitation consults.. Continue aspirin  and aggressive risk factor modification.long discussion with the patient and daughter and Dr. Eliseo Squires and answered questions. She'll likely need to go to inpatient rehabilitation or a cavernous skilled nursing setting for the next few days if she can swallow safely.Greater than 50% time during this 25 minute visit was spent on counseling and coordination of care about a lacunar infarct and neurological worsening and answered questions. Discussed with Dr. Eliseo Squires Stroke team will sign off. Kindly call for questions. Follow-up as  an outpatient in stroke clinic in 6 weeks Antony Contras, MD [facility] Pager: [phone] 12/07/2017 1:31 PM

## 2017-12-07 NOTE — Plan of Care (Signed)
  Problem: Education: Goal: Knowledge of patient specific risk factors addressed and post discharge goals established will improve Outcome: Progressing   

## 2017-12-07 NOTE — Progress Notes (Signed)
Two text pages were sent to the  hospitalist on call referencing the 4+ sec pauses pt has been having. New order for magnesium lab was entered for the first time but no response for the second text for any other changes. Pt remains asymptomatic.

## 2017-12-07 NOTE — Evaluation (Signed)
Occupational Therapy Evaluation Patient Details Name: Kelly Gibson MRN: [id number] DOB: 03/03/1961 Today's Date: 12/07/2017    History of Present Illness 57 y.o. female with prior history of left brain infarct in March 2019 with residual mild right-sided weakness/numbness who presented with worsening of right hemiplegia. MRI showed 2 new lacunar infarcts in the left periventricular white matter and corona radiata. She has additional PMH of COPD, depression, DM2, HLD, HTN.   Clinical Impression   This 57 y/o female presents with the above. At baseline pt is independent with ADLs and functional mobility, daughter reports she receives assist from spouse for transfers into/out of the car. Pt presenting with flaccid RUE, decreased dynamic balance and functional performance. Pt requiring ModA+2 for stand pivot transfers this session; currently requires Mod-MaxA(+2) for LB ADLs, ModA for seated UB ADLs. Pt demonstrates willingness to work with therapy and is motivated to return to PLOF. Pt's daughter present and supportive throughout session as well. Pt will benefit from continued acute OT services and feel pt is an appropriate candidate for CIR level services after discharge to maximize her overall safety and independence with ADLs and mobility prior to return home.     Follow Up Recommendations  CIR;Supervision/Assistance - 24 hour    Equipment Recommendations  Other (comment)(TBD in next venue )           Precautions / Restrictions Precautions Precautions: Fall Restrictions Weight Bearing Restrictions: No      Mobility Bed Mobility Overal bed mobility: Needs Assistance Bed Mobility: Supine to Sit     Supine to sit: +2 for safety/equipment;Max assist     General bed mobility comments: assist to advance RLE and scoot hips towards EOB; assist to bring trunk upright into sitting   Transfers Overall transfer level: Needs assistance Equipment used: 2 person hand held  assist Transfers: Sit to/from Stand;Stand Pivot Transfers Sit to Stand: Mod assist;+2 safety/equipment Stand pivot transfers: Mod assist;+2 physical assistance;+2 safety/equipment       General transfer comment: Pt completing stand pivot transfer EOB>BSC to the L with ModA+2, R knee blocked for increased stability with pt able to pivot on L foot to assist with transition; completed additional sit<>stand from [facility] using bedrail for LUE support     Balance Overall balance assessment: Needs assistance Sitting-balance support: Feet supported;No upper extremity supported Sitting balance-Leahy Scale: Fair Sitting balance - Comments: close minguard for static sitting    Standing balance support: During functional activity;Single extremity supported Standing balance-Leahy Scale: Poor Standing balance comment: reliant on UE support/external support from therapist                            ADL either performed or assessed with clinical judgement   ADL Overall ADL's : Needs assistance/impaired Eating/Feeding: Minimal assistance   Grooming: Minimal assistance;Sitting   Upper Body Bathing: Sitting;Minimal assistance Upper Body Bathing Details (indicate cue type and reason): pt able to wash chest, abdomen, and RUE; assist to wash LUE  Lower Body Bathing: Moderate assistance;+2 for physical assistance;+2 for safety/equipment;Sit to/from stand Lower Body Bathing Details (indicate cue type and reason): assist to wash lower portion of LEs while seated; pt maintaining static standing with modA using LUE support on bedrail (BSC and pt positioned to face bed rail) while additional therapist washes buttocks  Upper Body Dressing : Sitting;Minimal assistance Upper Body Dressing Details (indicate cue type and reason): donning new gown  Lower Body Dressing: Moderate assistance;Sit to/from stand;+2 for physical assistance;+2  for safety/equipment   Toilet Transfer: Moderate assistance;+2 for  physical assistance;+2 for safety/equipment;Stand-pivot;BSC   Toileting- Clothing Manipulation and Hygiene: Moderate assistance;+2 for physical assistance;+2 for safety/equipment;Sit to/from stand       Functional mobility during ADLs: Moderate assistance;+2 for physical assistance;+2 for safety/equipment(for stand pivot transfer) General ADL Comments: pt noted to have soiled gown and bed linens due to pure wick not working; transferred to [facility] to wash up and don new gown prior to transferring to [place]?: No apparent visual deficits Additional Comments: will continue to further assess during functional task completion                 Pertinent Vitals/Pain Pain Assessment: No/denies pain     Hand Dominance Right   Extremity/Trunk Assessment Upper Extremity Assessment Upper Extremity Assessment: RUE deficits/detail RUE Deficits / Details: flaccid RUE    Lower Extremity Assessment Lower Extremity Assessment: Defer to PT evaluation       Communication Communication Communication: Expressive difficulties   Cognition Arousal/Alertness: Awake/alert Behavior During Therapy: WFL for tasks assessed/performed Overall Cognitive Status: Impaired/Different from baseline Area of Impairment: Problem solving                             Problem Solving: Slow processing General Comments: slower processing noted and with expressive difficulties    General Comments  daughter present during session                Home Living Family/patient expects to be discharged to:: Private residence Living Arrangements: Spouse/significant other Available Help at Discharge: Family Type of Home: House Home Access: Level entry     Home Layout: Two level;Bed/bath upstairs Alternate Level Stairs-Number of Steps: 2 flights with a landing    Bathroom Shower/Tub: Teacher, early years/pre: Standard     Home Equipment: None   Additional  Comments: pt's daughter reports family is looking into arranging for 24hr supervision for the summer       Prior Functioning/Environment Level of Independence: Independent        Comments: spouse assisting with getting into/out of car        OT Problem List: Decreased strength;Impaired balance (sitting and/or standing);Decreased activity tolerance;Impaired UE functional use;Decreased range of motion      OT Treatment/Interventions: Self-care/ADL training;DME and/or AE instruction;Therapeutic activities;Balance training;Therapeutic exercise;Patient/family education;Visual/perceptual remediation/compensation    OT Goals(Current goals can be found in the care plan section) Acute Rehab OT Goals Patient Stated Goal: regain independence  OT Goal Formulation: With patient Time For Goal Achievement: 12/21/17 Potential to Achieve Goals: Good  OT Frequency: Min 3X/week               Co-evaluation PT/OT/SLP Co-Evaluation/Treatment: Yes Reason for Co-Treatment: Complexity of the patient's impairments (multi-system involvement);For patient/therapist safety;To address functional/ADL transfers   OT goals addressed during session: ADL's and self-care;Proper use of Adaptive equipment and DME;Strengthening/ROM      AM-PAC PT "6 Clicks" Daily Activity     Outcome Measure Help from another person eating meals?: A Little Help from another person taking care of personal grooming?: A Little Help from another person toileting, which includes using toliet, bedpan, or urinal?: A Lot Help from another person bathing (including washing, rinsing, drying)?: A Lot Help from another person to put on and taking off regular upper body clothing?: A Lot Help from another person to put on and taking off  regular lower body clothing?: A Lot 6 Click Score: 14   End of Session Equipment Utilized During Treatment: Gait belt Nurse Communication: Mobility status  Activity Tolerance: Patient tolerated treatment  well Patient left: in chair;with call bell/phone within reach;with nursing/sitter in room;with family/visitor present  OT Visit Diagnosis: Muscle weakness (generalized) (M62.81);Hemiplegia and hemiparesis Hemiplegia - Right/Left: Right Hemiplegia - dominant/non-dominant: Dominant Hemiplegia - caused by: Cerebral infarction                Time: 1856-3149 OT Time Calculation (min): 26 min Charges:  OT General Charges $OT Visit: 1 Visit OT Evaluation $OT Eval Moderate Complexity: 1 Mod G-Codes:     Lou Cal, OT Pager [phone] 12/07/2017   Raymondo Band 12/07/2017, 1:41 PM

## 2017-12-07 NOTE — Progress Notes (Signed)
Inpatient Diabetes Program Recommendations  AACE/ADA: New Consensus Statement on Inpatient Glycemic Control (2015)  Target Ranges:  Prepandial:   less than 140 mg/dL      Peak postprandial:   less than 180 mg/dL (1-2 hours)      Critically ill patients:  140 - 180 mg/dL   Lab Results  Component Value Date   GLUCAP 66 12/07/2017   HGBA1C 8.4 (H) 12/06/2017    Review of Glycemic ControlResults for ISLA, SABREE (MRN [id number]) as of 12/07/2017 16:01  Ref. Range 12/06/2017 21:45 12/06/2017 21:53 12/07/2017 06:08 12/07/2017 12:17  Glucose-Capillary Latest Ref Range: 65 - 99 mg/dL 75 69 75 66    Diabetes history: Type 2 DM Outpatient Diabetes medications:  Glyburide 2.5 mg daily, Toujeo 10 units q HS Current orders for Inpatient glycemic control:  Novolog sensitive tid with meals, Lantus 10 units q HS  Inpatient Diabetes Program Recommendations:    Consider d/c of Lantus.  Blood sugars<80 mg/dL.    Thanks,  Adah Perl, RN, BC-ADM Inpatient Diabetes Coordinator Pager [phone] (8a-5p)

## 2017-12-07 NOTE — Evaluation (Signed)
Physical Therapy Evaluation Patient Details Name: Kelly Gibson MRN: [id number] DOB: 02/15/1961 Today's Date: 12/07/2017   History of Present Illness  57 y.o. female with prior history of left brain infarct in March 2019 with residual mild right-sided weakness/numbness who presented with worsening of right hemiplegia. MRI showed 2 new lacunar infarcts in the left periventricular white matter and corona radiata. She has additional PMH of COPD, depression, DM2, HLD, HTN.  Clinical Impression  Orders received for PT evaluation. Patient demonstrates deficits in functional mobility as indicated below. Will benefit from continued skilled PT to address deficits and maximize function. Will see as indicated and progress as tolerated.  Prior to admission patient was independent with functional mobility, daughter reports she receives assist from spouse for transfers into/out of the car. Patient with no active movement on RUE and RLE. Currently, pt requiring ModA+2 for stand pivot transfers this session and was able to perform multiple sit <> stands with moderate assist and UE support. Patient is extremely motivated to progress and participate with therapies. Pt's daughter present and supportive throughout session as well. Feel patient would benefit significantly from comprehensive therapies, recommend CIR consult.    Follow Up Recommendations CIR;Supervision/Assistance - 24 hour    Equipment Recommendations  (tbd)    Recommendations for Other Services Rehab consult     Precautions / Restrictions Precautions Precautions: Fall Restrictions Weight Bearing Restrictions: No      Mobility  Bed Mobility Overal bed mobility: Needs Assistance Bed Mobility: Supine to Sit     Supine to sit: +2 for safety/equipment;Max assist     General bed mobility comments: assist to advance RLE and scoot hips towards EOB; assist to bring trunk upright into sitting   Transfers Overall transfer level: Needs  assistance Equipment used: 2 person hand held assist Transfers: Sit to/from Stand;Stand Pivot Transfers Sit to Stand: Mod assist;+2 safety/equipment Stand pivot transfers: Mod assist;+2 physical assistance;+2 safety/equipment       General transfer comment: Pt completing stand pivot transfer EOB>BSC to the L with ModA+2, R knee blocked for increased stability with pt able to pivot on L foot to assist with transition; completed additional sit<>stand from [facility] using bedrail for LUE support   Ambulation/Gait                Stairs            Wheelchair Mobility    Modified Rankin (Stroke Patients Only) Modified Rankin (Stroke Patients Only) Pre-Morbid Rankin Score: Moderately severe disability Modified Rankin: Severe disability     Balance Overall balance assessment: Needs assistance Sitting-balance support: Feet supported;No upper extremity supported Sitting balance-Leahy Scale: Fair Sitting balance - Comments: close minguard for static sitting    Standing balance support: During functional activity;Single extremity supported Standing balance-Leahy Scale: Poor Standing balance comment: reliant on UE support/external support from therapist                              Pertinent Vitals/Pain Pain Assessment: No/denies pain    Home Living Family/patient expects to be discharged to:: Private residence Living Arrangements: Spouse/significant other Available Help at Discharge: Family Type of Home: House Home Access: Level entry     Home Layout: Two level;Bed/bath upstairs Home Equipment: None Additional Comments: pt's daughter reports family is looking into arranging for 24hr supervision for the summer     Prior Function Level of Independence: Independent         Comments: spouse assisting  with getting into/out of car     Hand Dominance   Dominant Hand: Right    Extremity/Trunk Assessment   Upper Extremity Assessment Upper Extremity  Assessment: RUE deficits/detail RUE Deficits / Details: flaccid RUE     Lower Extremity Assessment Lower Extremity Assessment: RLE deficits/detail RLE Deficits / Details: flaccid RLE, no evidence of active movement RLE Sensation: decreased light touch RLE Coordination: (absent)       Communication   Communication: Expressive difficulties  Cognition Arousal/Alertness: Awake/alert Behavior During Therapy: WFL for tasks assessed/performed Overall Cognitive Status: Impaired/Different from baseline Area of Impairment: Problem solving                             Problem Solving: Slow processing General Comments: slower processing noted and with expressive difficulties       General Comments General comments (skin integrity, edema, etc.): daughter present during session     Exercises     Assessment/Plan    PT Assessment Patient needs continued PT services  PT Problem List Decreased strength;Decreased mobility;Decreased balance;Impaired sensation;Cardiopulmonary status limiting activity       PT Treatment Interventions Functional mobility training;Balance training;Patient/family education;Gait training;Therapeutic activities;Stair training;Therapeutic exercise;Cognitive remediation;Neuromuscular re-education    PT Goals (Current goals can be found in the Care Plan section)  Acute Rehab PT Goals Patient Stated Goal: regain independence  PT Goal Formulation: With patient Time For Goal Achievement: 12/21/17 Potential to Achieve Goals: Good    Frequency Min 3X/week   Barriers to discharge Decreased caregiver support;Inaccessible home environment      Co-evaluation PT/OT/SLP Co-Evaluation/Treatment: Yes Reason for Co-Treatment: Complexity of the patient's impairments (multi-system involvement);For patient/therapist safety;To address functional/ADL transfers PT goals addressed during session: Mobility/safety with mobility OT goals addressed during session: ADL's  and self-care;Proper use of Adaptive equipment and DME;Strengthening/ROM       AM-PAC PT "6 Clicks" Daily Activity  Outcome Measure Difficulty turning over in bed (including adjusting bedclothes, sheets and blankets)?: A Lot Difficulty moving from lying on back to sitting on the side of the bed? : Unable Difficulty sitting down on and standing up from a chair with arms (e.g., wheelchair, bedside commode, etc,.)?: Unable Help needed moving to and from a bed to chair (including a wheelchair)?: A Lot Help needed walking in hospital room?: A Lot Help needed climbing 3-5 steps with a railing? : Total 6 Click Score: 9    End of Session Equipment Utilized During Treatment: Gait belt Activity Tolerance: Patient tolerated treatment well Patient left: in chair;with call bell/phone within reach;with chair alarm set;with family/visitor present Nurse Communication: Mobility status PT Visit Diagnosis: Unsteadiness on feet (R26.81);Muscle weakness (generalized) (M62.81);Difficulty in walking, not elsewhere classified (R26.2);Hemiplegia and hemiparesis Hemiplegia - Right/Left: Right Hemiplegia - dominant/non-dominant: Dominant Hemiplegia - caused by: Unspecified    Time: 5956-3875 PT Time Calculation (min) (ACUTE ONLY): 24 min   Charges:   PT Evaluation $PT Eval Moderate Complexity: 1 Mod     PT G Codes:        Alben Deeds, PT DPT  Board Certified Neurologic Specialist [place] 12/07/2017, 2:03 PM

## 2017-12-07 NOTE — Progress Notes (Signed)
PROGRESS NOTE    Kelly Gibson  OFB:[id number] DOB: 04/28/1961 DOA: 12/05/2017 PCP: Alycia Rossetti, MD   Outpatient Specialists:     Brief Narrative:  Kelly Gibson is a 57 y.o. female with medical history significant of diabetes, hypertension, hyperlipidemia, COPD as well as recent CVA in March of this ear that left her with residual right sided weakness. Patient came to the ER today complaining of dizziness. She woke up this morning and felt weak n the same side that felt like her previous CVA. She also felt dizzy. Patient said she was able to do all her ADLs prior to these.She was improving from her previous stroke in March. She has a loop recorder in place which showed about 2 seconds pauses last night. In the ER patient was evaluated and found to have a new infarct. She's only been evaluated by neurology and we are admitting her for further workup.   Assessment & Plan:   Principal Problem:   Cerebrovascular disease Active Problems:   Diabetes mellitus type II, uncontrolled ([place])   Essential hypertension, benign   Tobacco user   Uncontrolled hypertension   Acute CVA (cerebrovascular accident) ([place])   acute CVA:  -ASA -SLP eval (from first eval by RN to now appears that she has worsened with facial droop as well as speech) -echo: positive bubble study -CTA: No hemodynamically significant stenosis in the neck, but Moderate intracranial stenoses: - distal Left vertebral artery. - distal 3rd Basilar Artery proximal to the SCA origins. - Right MCA posterior M2 branch origin. -neurology consult -LE duplex negative for DVT -PT/OT eval -on statin (LDL: 93) -recently in hospital in [facility] in February and [facility] in April for CVA like symptoms and CVAs)  Dysphagia -for MBS today  Sinus bradycardia with pauses -cards following as an outpatient -? Sleep apnea-- outpatient sleep study  Mediastinal lymphadenopathy - CT scan of chest with contrast- inpt as she has weight loss     Hypokalemia -not able to swallow so will replace IV -replace Mg as well   IDDM  -recent HgbA1c was uncontrolled (12.2---> 8.4) -continue SSI/lantus  Hypertension:  -Permissive hypertension CVA.  tobacco abuse:  -encouraged cessation  Weight loss- unintended -with this plus mediastinal lymphadenopathy will plan to get CT scan of chest with contrast in AM after CVA work up complete       DVT prophylaxis:  Lovenox   Code Status: Full Code   Family Communication: At bedside  Disposition Plan:     Consultants:   neuro     Subjective: Improved some today, still cannot move right arm  Objective: Vitals:   12/07/17 0009 12/07/17 0400 12/07/17 0907 12/07/17 1038  BP: (!) 163/88 (!) 146/88  (!) 172/79  Pulse: 82 77 67 (!) 49  Resp: 18 18 18 16   Temp: 97.6 F (36.4 C) 97.8 F (36.6 C)  97.9 F (36.6 C)  TempSrc: Oral Oral  Oral  SpO2: 98% 100% 97% 100%  Weight:      Height:        Intake/Output Summary (Last 24 hours) at 12/07/2017 1214 Last data filed at 12/07/2017 0353 Gross per 24 hour  Intake 2858 ml  Output -  Net 2858 ml   Filed Weights   12/06/17 0921  Weight: 70.3 kg (155 lb)    Examination:  General exam: speech improved today Respiratory system: no increased work of breathing Cardiovascular system: rrr Gastrointestinal system: +BS, soft Central nervous system: alert Psychiatry: good eye contact  Data Reviewed: I have personally reviewed following labs and imaging studies  CBC: Recent Labs  Lab 12/05/17 1244 12/05/17 1251 12/05/17 2226 12/07/17 0717  WBC 5.4  --  5.6 4.6  NEUTROABS 2.6  --   --   --   HGB 12.4 12.9 11.9* 12.5  HCT 36.9 38.0 35.1* 36.9  MCV 83.3  --  82.0 82.6  PLT 393  --  364 413   Basic Metabolic Panel: Recent Labs  Lab 12/05/17 1244 12/05/17 1251 12/05/17 2226 12/06/17 0459 12/06/17 1818 12/07/17 0717  NA 141 141  --  141 141 141  K 3.7 3.7  --  2.8* 3.2* 3.1*  CL 103 103  --   104 106 106  CO2 28  --   --  25 22 22   GLUCOSE 218* 215*  --  109* 79 86  BUN 25* 25*  --  15 11 11   CREATININE 1.09* 1.00 0.86 0.83 0.80 0.80  CALCIUM 9.6  --   --  9.1 9.2 9.2  MG  --   --   --   --  1.7 1.7   GFR: Estimated Creatinine Clearance: 73 mL/min (by C-G formula based on SCr of 0.8 mg/dL). Liver Function Tests: Recent Labs  Lab 12/05/17 1244 12/06/17 0459  AST 18 16  ALT 10* 9*  ALKPHOS 72 65  BILITOT 0.4 0.4  PROT 7.6 7.2  ALBUMIN 3.8 3.3*   No results for input(s): LIPASE, AMYLASE in the last 168 hours. No results for input(s): AMMONIA in the last 168 hours. Coagulation Profile: Recent Labs  Lab 12/05/17 1244  INR 1.04   Cardiac Enzymes: No results for input(s): CKTOTAL, CKMB, CKMBINDEX, TROPONINI in the last 168 hours. BNP (last 3 results) No results for input(s): PROBNP in the last 8760 hours. HbA1C: Recent Labs    12/06/17 0459  HGBA1C 8.4*   CBG: Recent Labs  Lab 12/06/17 1137 12/06/17 1658 12/06/17 2145 12/06/17 2153 12/07/17 0608  GLUCAP 97 79 75 69 75   Lipid Profile: Recent Labs    12/05/17 2021 12/06/17 0459  CHOL 182 169  HDL 57 49  LDLCALC 100* 93  TRIG 125 137  CHOLHDL 3.2 3.4   Thyroid Function Tests: No results for input(s): TSH, T4TOTAL, FREET4, T3FREE, THYROIDAB in the last 72 hours. Anemia Panel: No results for input(s): VITAMINB12, FOLATE, FERRITIN, TIBC, IRON, RETICCTPCT in the last 72 hours. Urine analysis:    Component Value Date/Time   COLORURINE STRAW (A) 12/05/2017 2226   APPEARANCEUR CLEAR 12/05/2017 2226   LABSPEC 1.019 12/05/2017 2226   PHURINE 7.0 12/05/2017 2226   GLUCOSEU NEGATIVE 12/05/2017 2226   HGBUR NEGATIVE 12/05/2017 2226   BILIRUBINUR NEGATIVE 12/05/2017 2226   KETONESUR NEGATIVE 12/05/2017 2226   PROTEINUR NEGATIVE 12/05/2017 2226   UROBILINOGEN 0.2 01/26/2007 1410   NITRITE NEGATIVE 12/05/2017 2226   LEUKOCYTESUR NEGATIVE 12/05/2017 2226     )No results found for this or any  previous visit (from the past 240 hour(s)).    Anti-infectives (From admission, onward)   None       Radiology Studies: Ct Angio Head W Or Wo Contrast  Result Date: 12/05/2017 CLINICAL DATA:  57 year old female with acute and subacute on chronic small vessel infarcts demonstrated on MRI today performed for dizziness and difficulty walking. EXAM: CT ANGIOGRAPHY HEAD AND NECK TECHNIQUE: Multidetector CT imaging of the head and neck was performed using the standard protocol during bolus administration of intravenous contrast. Multiplanar CT image reconstructions and MIPs  were obtained to evaluate the vascular anatomy. Carotid stenosis measurements (when applicable) are obtained utilizing NASCET criteria, using the distal internal carotid diameter as the denominator. CONTRAST:  46mL ISOVUE-370 IOPAMIDOL (ISOVUE-370) INJECTION 76% COMPARISON:  Brain MRI 1659 hours today. FINDINGS: CTA NECK Skeleton: Occasional carious dentition. No acute osseous abnormality identified. Upper chest: Negative lung apices, but abnormally increased mediastinal lymph nodes, such as the anterior carina node on series 5, image 167. There may also be increased hilar nodes. Other neck: The glottis is closed.  No neck mass or lymphadenopathy. Aortic arch: 3 vessel arch configuration. Abundant soft plaque of the arch. Minimal calcified plaque probably just proximal to the brachiocephalic artery origin rather than ulcerated plaque (series 8, image 131). Right carotid system: No right CCA origin stenosis despite soft plaque. Minimal plaque at the right carotid bifurcation. No cervical right ICA stenosis, but the vessel is tortuous just below the skull base. Left carotid system: No stenosis despite soft plaque at the left CCA origin. Tortuous proximal left CCA. Soft plaque at the left ICA origin and bulb but no stenosis. Tortuous left ICA just below the skull base. Vertebral arteries: No proximal right subclavian artery or right vertebral  artery origin stenosis despite soft plaque. The right vertebral artery is dominant and tortuous in the neck but patent to the skull base without stenosis. Abundant proximal left subclavian soft plaques similar to that in the arch. Focal low-density plaque is evident on series 5, image 145. Still there is no hemodynamically significant left subclavian stenosis. Likewise, only mild left vertebral artery origin stenosis due to soft plaque. The left vertebral artery is non dominant and tortuous in the neck. No cervical left vertebral stenosis. CTA HEAD Posterior circulation: Mildly dominant distal right vertebral artery without stenosis. Dominant right AICA. Patent distal left vertebral artery with moderate stenosis affecting a long segment of the distal V4 just proximal to the vertebrobasilar junction (series 7, image 132). The left PICA origin is patent. Patent basilar artery with mild irregularity proximally, and mild to moderate circumferential stenosis distally proximal to the basilar tip (series 12, image 23). The SCA and PCA origins remain patent. There is mild irregularity throughout the right PCA, but no stenosis or occlusion. Lesser left PCA irregularity. Posterior communicating arteries are diminutive or absent. Anterior circulation: Both ICA siphons are patent. There is mild irregularity throughout both siphons with intermittent calcified plaque. No significant siphon stenosis. Normal ophthalmic artery origins. Patent carotid termini. Patent MCA and ACA origins. The proximal ACA is are within normal limits. There is mild distal ACA irregularity. The left MCA M1 segment is mildly irregular and bifurcates early. No significant stenosis or left MCA branch occlusion identified. Mild left MCA branch irregularity. The right MCA is patent without stenosis. There is moderate irregularity and stenosis at the origin of the dominant right M2 posterior branch (series 11, image 15. Mild right MCA branch irregularity  otherwise. Venous sinuses: Patent on the delayed images. Anatomic variants: Mildly dominant right vertebral artery. Delayed phase: No abnormal enhancement identified. Stable gray-white matter differentiation throughout the brain. Review of the MIP images confirms the above findings IMPRESSION: 1. Negative for large vessel occlusion. 2. Positive for widespread atherosclerosis in the head, neck, and chest. Abundant soft plaque of the aortic arch and the proximal great vessels. 3. No hemodynamically significant stenosis in the neck, but Moderate intracranial stenoses: - distal Left vertebral artery. - distal 3rd Basilar Artery proximal to the SCA origins. - Right MCA posterior M2 branch origin. 4.  Stable CT appearance of the brain since 1250 hours today. 5. Nonspecific mediastinal lymphadenopathy is partially visible. RECOMMEND routine follow-up Chest CT (IV contrast preferred) further characterize. Electronically Signed   By: Genevie Ann M.D.   On: 12/05/2017 18:55   Ct Head Wo Contrast  Result Date: 12/07/2017 CLINICAL DATA:  Stroke follow-up EXAM: CT HEAD WITHOUT CONTRAST TECHNIQUE: Contiguous axial images were obtained from the base of the skull through the vertex without intravenous contrast. COMPARISON:  12/05/2017 FINDINGS: Brain: Known acute infarct in the left basal ganglia and left genu of the corpus callosum, the latter very subtle by CT. There is a background of extensive chronic small vessel ischemic injury in the deep gray nuclei and cerebral white matter. Atrophy greatest in the frontal lobes. No hemorrhage, hydrocephalus, or evidence of interval infarct. Vascular: Atherosclerotic calcification.  No hyperdense vessel Skull: No acute or aggressive finding Sinuses/Orbits: Negative IMPRESSION: 1. Known acute small-vessel infarcts. No hemorrhage or detectable progression when compared to brain MRI yesterday. 2. Advanced chronic small vessel disease. Electronically Signed   By: Monte Fantasia M.D.   On:  12/07/2017 10:15   Ct Head Wo Contrast  Result Date: 12/05/2017 CLINICAL DATA:  Dizziness and difficulty walking beginning this morning. Normal last night. EXAM: CT HEAD WITHOUT CONTRAST TECHNIQUE: Contiguous axial images were obtained from the base of the skull through the vertex without intravenous contrast. COMPARISON:  CT and MRI 10/26/2017 FINDINGS: Brain: Generalized atrophy. Chronic small-vessel changes affecting the pons. Old small vessel infarctions affecting the thalami and basal ganglia. Chronic small-vessel ischemic changes of the white matter. No sign of acute infarction, mass lesion, hemorrhage, hydrocephalus or extra-axial collection. Vascular: There is atherosclerotic calcification of the major vessels at the base of the brain. Skull: Negative Sinuses/Orbits: Clear/normal Other: None IMPRESSION: No acute finding by CT. Extensive old ischemic changes throughout the brain as outlined above. Electronically Signed   By: Nelson Chimes M.D.   On: 12/05/2017 13:03   Ct Angio Neck W Or Wo Contrast  Result Date: 12/05/2017 CLINICAL DATA:  57 year old female with acute and subacute on chronic small vessel infarcts demonstrated on MRI today performed for dizziness and difficulty walking. EXAM: CT ANGIOGRAPHY HEAD AND NECK TECHNIQUE: Multidetector CT imaging of the head and neck was performed using the standard protocol during bolus administration of intravenous contrast. Multiplanar CT image reconstructions and MIPs were obtained to evaluate the vascular anatomy. Carotid stenosis measurements (when applicable) are obtained utilizing NASCET criteria, using the distal internal carotid diameter as the denominator. CONTRAST:  31mL ISOVUE-370 IOPAMIDOL (ISOVUE-370) INJECTION 76% COMPARISON:  Brain MRI 1659 hours today. FINDINGS: CTA NECK Skeleton: Occasional carious dentition. No acute osseous abnormality identified. Upper chest: Negative lung apices, but abnormally increased mediastinal lymph nodes, such as  the anterior carina node on series 5, image 167. There may also be increased hilar nodes. Other neck: The glottis is closed.  No neck mass or lymphadenopathy. Aortic arch: 3 vessel arch configuration. Abundant soft plaque of the arch. Minimal calcified plaque probably just proximal to the brachiocephalic artery origin rather than ulcerated plaque (series 8, image 131). Right carotid system: No right CCA origin stenosis despite soft plaque. Minimal plaque at the right carotid bifurcation. No cervical right ICA stenosis, but the vessel is tortuous just below the skull base. Left carotid system: No stenosis despite soft plaque at the left CCA origin. Tortuous proximal left CCA. Soft plaque at the left ICA origin and bulb but no stenosis. Tortuous left ICA just below the skull  base. Vertebral arteries: No proximal right subclavian artery or right vertebral artery origin stenosis despite soft plaque. The right vertebral artery is dominant and tortuous in the neck but patent to the skull base without stenosis. Abundant proximal left subclavian soft plaques similar to that in the arch. Focal low-density plaque is evident on series 5, image 145. Still there is no hemodynamically significant left subclavian stenosis. Likewise, only mild left vertebral artery origin stenosis due to soft plaque. The left vertebral artery is non dominant and tortuous in the neck. No cervical left vertebral stenosis. CTA HEAD Posterior circulation: Mildly dominant distal right vertebral artery without stenosis. Dominant right AICA. Patent distal left vertebral artery with moderate stenosis affecting a long segment of the distal V4 just proximal to the vertebrobasilar junction (series 7, image 132). The left PICA origin is patent. Patent basilar artery with mild irregularity proximally, and mild to moderate circumferential stenosis distally proximal to the basilar tip (series 12, image 23). The SCA and PCA origins remain patent. There is mild  irregularity throughout the right PCA, but no stenosis or occlusion. Lesser left PCA irregularity. Posterior communicating arteries are diminutive or absent. Anterior circulation: Both ICA siphons are patent. There is mild irregularity throughout both siphons with intermittent calcified plaque. No significant siphon stenosis. Normal ophthalmic artery origins. Patent carotid termini. Patent MCA and ACA origins. The proximal ACA is are within normal limits. There is mild distal ACA irregularity. The left MCA M1 segment is mildly irregular and bifurcates early. No significant stenosis or left MCA branch occlusion identified. Mild left MCA branch irregularity. The right MCA is patent without stenosis. There is moderate irregularity and stenosis at the origin of the dominant right M2 posterior branch (series 11, image 15. Mild right MCA branch irregularity otherwise. Venous sinuses: Patent on the delayed images. Anatomic variants: Mildly dominant right vertebral artery. Delayed phase: No abnormal enhancement identified. Stable gray-white matter differentiation throughout the brain. Review of the MIP images confirms the above findings IMPRESSION: 1. Negative for large vessel occlusion. 2. Positive for widespread atherosclerosis in the head, neck, and chest. Abundant soft plaque of the aortic arch and the proximal great vessels. 3. No hemodynamically significant stenosis in the neck, but Moderate intracranial stenoses: - distal Left vertebral artery. - distal 3rd Basilar Artery proximal to the SCA origins. - Right MCA posterior M2 branch origin. 4.  Stable CT appearance of the brain since 1250 hours today. 5. Nonspecific mediastinal lymphadenopathy is partially visible. RECOMMEND routine follow-up Chest CT (IV contrast preferred) further characterize. Electronically Signed   By: Genevie Ann M.D.   On: 12/05/2017 18:55   Mr Brain Wo Contrast  Result Date: 12/05/2017 CLINICAL DATA:  57 year old female with dizziness,  difficulty walking beginning this morning. EXAM: MRI HEAD WITHOUT CONTRAST TECHNIQUE: Multiplanar, multiecho pulse sequences of the brain and surrounding structures were obtained without intravenous contrast. COMPARISON:  Head CT without contrast 1250 hours today. Brain MRI 10/26/2017, and earlier. FINDINGS: Brain: There are 2 small areas of abnormal diffusion in the left frontal lobe. A nodular focus of restriction in the left periventricular white matter along the anterior left frontal horn is noted on series 501, image 58 (ADC map image 22). A patchy curvilinear area in the left corona radiata appears only partially restricted (image 57). Both there is demonstrate T2 and FLAIR hyperintensity. No associated hemorrhage or mass effect. No other No restricted diffusion or evidence of acute infarction. Abnormal T2 and FLAIR signal in the pons, throughout the bilateral deep gray  matter nuclei, and cerebral white matter most resembling advanced chronic small vessel ischemia. Focal involvement of the deep white matter capsules, and also the anterior body of the corpus callosum which is abnormally thinned (series 7001, image 12), and mild chronic blood products also noted at this site (series 11001, image 40). Chronic hemorrhage also in the left deep gray matter nuclei and lower brainstem (image 15). The cerebellar hemispheres appear spared. No cerebral cortical encephalomalacia is identified. No midline shift, mass effect, evidence of mass lesion, ventriculomegaly, extra-axial collection or acute intracranial hemorrhage. Cervicomedullary junction and pituitary are within normal limits. Vascular: Major intracranial vascular flow voids are stable. Skull and upper cervical spine: Negative visible cervical spine. Visualized bone marrow signal is within normal limits. Sinuses/Orbits: Stable and negative. Other: Mastoid air cells remain clear. Grossly normal visible internal auditory structures. Negative scalp and face soft  tissues. IMPRESSION: 1. Small acute lacunar infarct along the left frontal horn periventricular white matter. Small subacute lacunar infarct in the left posterior corona radiata. No acute hemorrhage or mass effect. 2. Underlying very advanced chronic small vessel disease redemonstrated. Numerous chronic lacunar infarcts in the deep gray matter nuclei, brainstem, and cerebral white matter including the corpus callosum. Electronically Signed   By: Genevie Ann M.D.   On: 12/05/2017 17:34        Scheduled Meds: . aspirin EC  325 mg Oral Daily  . aspirin  300 mg Rectal Daily  . enoxaparin (LOVENOX) injection  40 mg Subcutaneous Q24H  . insulin aspart  0-9 Units Subcutaneous TID WC  . insulin glargine  10 Units Subcutaneous QHS  . nicotine  21 mg Transdermal Daily  . rosuvastatin  40 mg Oral q1800  . umeclidinium bromide  1 puff Inhalation Daily   Continuous Infusions: . sodium chloride 75 mL/hr at 12/07/17 0352     LOS: 2 days    Time spent: 25 min    Geradine Girt, DO [facility] Pager [phone]  If 7PM-7AM, please contact night-coverage [email or web address] Password [facility] 12/07/2017, 12:14 PM

## 2017-12-07 NOTE — Progress Notes (Signed)
Rehab Admissions Coordinator Note:  Patient was screened by Cleatrice Burke for appropriateness for an Inpatient Acute Rehab Consult per PT and OT recommendations.  At this time, we are recommending Inpatient Rehab consult.  Cleatrice Burke 12/07/2017, 4:21 PM  I can be reached at [phone].

## 2017-12-07 NOTE — Progress Notes (Signed)
  Speech Language Pathology Treatment: Dysphagia  Patient Details Name: Sherita Decoste MRN: [id number] DOB: 05/06/1961 Today's Date: 12/07/2017 Time: 0850-0900 SLP Time Calculation (min) (ACUTE ONLY): 10 min  Assessment / Plan / Recommendation Clinical Impression  Pt is more interactive this morning compared to previous date and does not have perseverative mastication pattern at rest. She does still have a significant right-sided facial droop and reduced sensation, resulting in anterior spillage, prolonged oral transit, and immediate coughing with thin liquids. Coughing subsides with Mod cues from SLP for use of straw and placement on L side. Given improvements from previous date although with persistent signs of dysphagia, recommend proceeding with MBS to better assess oropharyngeal function.   HPI HPI: Pt is a 57 y.o.femalewith prior history of left brain infarct in March 2019 with residual mild right-sided weakness/numbness who presented with worsening of right hemiplegia. MRI showed 2 new lacunar infarcts in the left periventricular white matter and corona radiata. She has additional PMH of COPD, depression, DM2, HLD, HTN. Pt initially passed the stroke swallow screen but on the morning of 5/13 she had a worsening of her neurological symptoms, including increased dysarthria and trouble swallowing.       SLP Plan  MBS       Recommendations  Diet recommendations: NPO Medication Administration: Crushed with puree                Oral Care Recommendations: Oral care QID Follow up Recommendations: (tba) SLP Visit Diagnosis: Dysphagia, oropharyngeal phase (R13.12) Plan: MBS       GO                Germain Osgood 12/07/2017, 9:29 AM  Germain Osgood, M.A. [name] [phone]

## 2017-12-07 NOTE — Care Management Note (Signed)
Case Management Note  Patient Details  Name: Myeisha Kruser MRN: [id number] Date of Birth: 02/27/1961  Subjective/Objective:     Pt admitted with CVA. She is from home with spouse. She was recently hospitalized and d/ced with [facility] for [facility] services.                Action/Plan: Awaiting PT/OT evals. CM following for d/c needs, physician orders.  Expected Discharge Date:                  Expected Discharge Plan:     In-House Referral:     Discharge planning Services     Post Acute Care Choice:    Choice offered to:     DME Arranged:    DME Agency:     HH Arranged:    HH Agency:     Status of Service:  In process, will continue to follow  If discussed at Long Length of Stay Meetings, dates discussed:    Additional Comments:  Pollie Friar, RN 12/07/2017, 10:29 AM

## 2017-12-08 ENCOUNTER — Telehealth: Payer: Self-pay

## 2017-12-08 ENCOUNTER — Ambulatory Visit: Payer: BLUE CROSS/BLUE SHIELD | Admitting: Neurology

## 2017-12-08 ENCOUNTER — Inpatient Hospital Stay (HOSPITAL_COMMUNITY): Payer: BLUE CROSS/BLUE SHIELD

## 2017-12-08 DIAGNOSIS — I679 Cerebrovascular disease, unspecified: Secondary | ICD-10-CM

## 2017-12-08 DIAGNOSIS — I10 Essential (primary) hypertension: Secondary | ICD-10-CM

## 2017-12-08 LAB — CBC
HEMATOCRIT: 37.6 % (ref 36.0–46.0)
Hemoglobin: 12.5 g/dL (ref 12.0–15.0)
MCH: 27.7 pg (ref 26.0–34.0)
MCHC: 33.2 g/dL (ref 30.0–36.0)
MCV: 83.2 fL (ref 78.0–100.0)
Platelets: 350 10*3/uL (ref 150–400)
RBC: 4.52 MIL/uL (ref 3.87–5.11)
RDW: 13.2 % (ref 11.5–15.5)
WBC: 4.7 10*3/uL (ref 4.0–10.5)

## 2017-12-08 LAB — BASIC METABOLIC PANEL
ANION GAP: 10 (ref 5–15)
BUN: 11 mg/dL (ref 6–20)
CO2: 24 mmol/L (ref 22–32)
Calcium: 8.9 mg/dL (ref 8.9–10.3)
Chloride: 107 mmol/L (ref 101–111)
Creatinine, Ser: 0.76 mg/dL (ref 0.44–1.00)
GFR calc Af Amer: >60 mL/min (ref >60)
GLUCOSE: 99 mg/dL (ref 65–99)
POTASSIUM: 3.4 mmol/L — AB (ref 3.5–5.1)
Sodium: 141 mmol/L (ref 135–145)

## 2017-12-08 LAB — GLUCOSE, CAPILLARY
GLUCOSE-CAPILLARY: 102 mg/dL — AB (ref 65–99)
GLUCOSE-CAPILLARY: 73 mg/dL (ref 65–99)
GLUCOSE-CAPILLARY: 108 mg/dL — AB (ref 65–99)
Glucose-Capillary: 90 mg/dL (ref 65–99)
Glucose-Capillary: 92 mg/dL (ref 65–99)

## 2017-12-08 MED ORDER — LISINOPRIL 20 MG PO TABS
40.0000 mg | ORAL_TABLET | Freq: Every day | ORAL | Status: DC
  Administered 2017-12-08 – 2017-12-09 (×2): 40 mg via ORAL
  Filled 2017-12-08 (×2): qty 2

## 2017-12-08 MED ORDER — IOHEXOL 300 MG/ML  SOLN
75.0000 mL | Freq: Once | INTRAMUSCULAR | Status: AC | PRN
  Administered 2017-12-08: 100 mL via INTRAVENOUS

## 2017-12-08 MED ORDER — AMLODIPINE BESYLATE 5 MG PO TABS
5.0000 mg | ORAL_TABLET | Freq: Every day | ORAL | Status: DC
  Administered 2017-12-08 – 2017-12-09 (×2): 5 mg via ORAL
  Filled 2017-12-08 (×2): qty 1

## 2017-12-08 MED ORDER — POTASSIUM CHLORIDE CRYS ER 20 MEQ PO TBCR
40.0000 meq | EXTENDED_RELEASE_TABLET | Freq: Once | ORAL | Status: AC
  Administered 2017-12-08: 40 meq via ORAL
  Filled 2017-12-08: qty 2

## 2017-12-08 MED ORDER — HYDRALAZINE HCL 20 MG/ML IJ SOLN: 5.0000 mg | Freq: Three times a day (TID) | INTRAMUSCULAR | Status: DC | PRN

## 2017-12-08 NOTE — Progress Notes (Signed)
PROGRESS NOTE    Kelly Gibson  GBT:[id number] DOB: 08/06/1960 DOA: 12/05/2017 PCP: Alycia Rossetti, MD   Outpatient Specialists:     Brief Narrative:  Kelly Gibson is a 57 y.o. female with medical history significant of diabetes, hypertension, hyperlipidemia, COPD as well as recent CVA in March of this year that left her with residual right sided weakness. Patient came to the ER today complaining of dizziness. She woke up this morning and felt weak n the same side that felt like her previous CVA. She also felt dizzy. Patient said she was able to do all her ADLs prior to these.She was improving from her previous stroke in March. She has a loop recorder in place which showed about 2 seconds pauses last night. In the ER patient was evaluated and found to have a new infarct. Work up complete and now being evaluated for CIR. Also found to have weight loss, mediastinal adenopathy and CT scan of chest being done.    Assessment & Plan:   Principal Problem:   Cerebrovascular disease Active Problems:   Diabetes mellitus type II, uncontrolled ([place])   Essential hypertension, benign   Tobacco user   Uncontrolled hypertension   Acute CVA (cerebrovascular accident) ([place])   acute CVA:  -ASA -echo: positive bubble study but duplex negative -CTA: No hemodynamically significant stenosis in the neck, but Moderate intracranial stenoses: - distal Left vertebral artery. - distal 3rd Basilar Artery proximal to the SCA origins. - Right MCA posterior M2 branch origin. -neurology consult appreciated -PT/OT eval-- CIR -on statin (LDL: 93) -recently in hospital in [facility] in February and [facility] in April for CVA like symptoms and CVAs)  Dysphagia -DYS 2 diet  Sinus bradycardia with pauses -cards following as an outpatient-- aware of pauses and this was discussed with EP-- no role for pacemaker yet -? Sleep apnea-- outpatient sleep study  Mediastinal lymphadenopathy - CT scan of chest with  contrast ordered  Hypokalemia -replace -replace Mg as well   IDDM  -recent HgbA1c was uncontrolled (12.2---> 8.4) -continue SSI -lantus on hold  Hypertension:  -Permissive hypertension CVA.  tobacco abuse:  -encouraged cessation  Weight loss- unintended -with this plus mediastinal lymphadenopathy will plan to get CT scan of chest with contrast in AM after CVA work up complete       DVT prophylaxis:  Lovenox   Code Status: Full Code   Family Communication: At bedside 5/14  Disposition Plan:  CIR?   Consultants:   Neuro  CIR     Subjective: Happy to be eating  Objective: Vitals:   12/08/17 0457 12/08/17 0857 12/08/17 1119 12/08/17 1201  BP: (!) 143/94 (!) 153/87  (!) 166/100  Pulse: 80 86  68  Resp:  18  18  Temp:  98.2 F (36.8 C)  98.8 F (37.1 C)  TempSrc:  Oral  Oral  SpO2:  100% 99% 100%  Weight:      Height:       No intake or output data in the 24 hours ending 12/08/17 1242 Filed Weights   12/06/17 7106  Weight: 70.3 kg (155 lb)    Examination:  General exam: in bed, NAD Respiratory system: no wheezing Cardiovascular system: rrr (tele shows pauses) Gastrointestinal system: +BS, soft Central nervous system: alert, feeling intact on right side, no movement     Data Reviewed: I have personally reviewed following labs and imaging studies  CBC: Recent Labs  Lab 12/05/17 1244 12/05/17 1251 12/05/17 2226 12/07/17 0717 12/08/17 0545  WBC 5.4  --  5.6 4.6 4.7  NEUTROABS 2.6  --   --   --   --   HGB 12.4 12.9 11.9* 12.5 12.5  HCT 36.9 38.0 35.1* 36.9 37.6  MCV 83.3  --  82.0 82.6 83.2  PLT 393  --  364 388 478   Basic Metabolic Panel: Recent Labs  Lab 12/05/17 1244 12/05/17 1251 12/05/17 2226 12/06/17 0459 12/06/17 1818 12/07/17 0717 12/08/17 0545  NA 141 141  --  141 141 141 141  K 3.7 3.7  --  2.8* 3.2* 3.1* 3.4*  CL 103 103  --  104 106 106 107  CO2 28  --   --  25 22 22 24   GLUCOSE 218* 215*  --  109*  79 86 99  BUN 25* 25*  --  15 11 11 11   CREATININE 1.09* 1.00 0.86 0.83 0.80 0.80 0.76  CALCIUM 9.6  --   --  9.1 9.2 9.2 8.9  MG  --   --   --   --  1.7 1.7  --    GFR: Estimated Creatinine Clearance: 73 mL/min (by C-G formula based on SCr of 0.76 mg/dL). Liver Function Tests: Recent Labs  Lab 12/05/17 1244 12/06/17 0459  AST 18 16  ALT 10* 9*  ALKPHOS 72 65  BILITOT 0.4 0.4  PROT 7.6 7.2  ALBUMIN 3.8 3.3*   No results for input(s): LIPASE, AMYLASE in the last 168 hours. No results for input(s): AMMONIA in the last 168 hours. Coagulation Profile: Recent Labs  Lab 12/05/17 1244  INR 1.04   Cardiac Enzymes: No results for input(s): CKTOTAL, CKMB, CKMBINDEX, TROPONINI in the last 168 hours. BNP (last 3 results) No results for input(s): PROBNP in the last 8760 hours. HbA1C: Recent Labs    12/06/17 0459  HGBA1C 8.4*   CBG: Recent Labs  Lab 12/07/17 1217 12/07/17 1636 12/08/17 0033 12/08/17 0606 12/08/17 1156  GLUCAP 66 100* 90 92 102*   Lipid Profile: Recent Labs    12/05/17 2021 12/06/17 0459  CHOL 182 169  HDL 57 49  LDLCALC 100* 93  TRIG 125 137  CHOLHDL 3.2 3.4   Thyroid Function Tests: No results for input(s): TSH, T4TOTAL, FREET4, T3FREE, THYROIDAB in the last 72 hours. Anemia Panel: No results for input(s): VITAMINB12, FOLATE, FERRITIN, TIBC, IRON, RETICCTPCT in the last 72 hours. Urine analysis:    Component Value Date/Time   COLORURINE STRAW (A) 12/05/2017 2226   APPEARANCEUR CLEAR 12/05/2017 2226   LABSPEC 1.019 12/05/2017 2226   PHURINE 7.0 12/05/2017 2226   GLUCOSEU NEGATIVE 12/05/2017 2226   HGBUR NEGATIVE 12/05/2017 2226   BILIRUBINUR NEGATIVE 12/05/2017 2226   KETONESUR NEGATIVE 12/05/2017 2226   PROTEINUR NEGATIVE 12/05/2017 2226   UROBILINOGEN 0.2 01/26/2007 1410   NITRITE NEGATIVE 12/05/2017 2226   LEUKOCYTESUR NEGATIVE 12/05/2017 2226     )No results found for this or any previous visit (from the past 240 hour(s)).     Anti-infectives (From admission, onward)   None       Radiology Studies: Ct Head Wo Contrast  Result Date: 12/07/2017 CLINICAL DATA:  Stroke follow-up EXAM: CT HEAD WITHOUT CONTRAST TECHNIQUE: Contiguous axial images were obtained from the base of the skull through the vertex without intravenous contrast. COMPARISON:  12/05/2017 FINDINGS: Brain: Known acute infarct in the left basal ganglia and left genu of the corpus callosum, the latter very subtle by CT. There is a background of extensive chronic small vessel ischemic injury  in the deep gray nuclei and cerebral white matter. Atrophy greatest in the frontal lobes. No hemorrhage, hydrocephalus, or evidence of interval infarct. Vascular: Atherosclerotic calcification.  No hyperdense vessel Skull: No acute or aggressive finding Sinuses/Orbits: Negative IMPRESSION: 1. Known acute small-vessel infarcts. No hemorrhage or detectable progression when compared to brain MRI yesterday. 2. Advanced chronic small vessel disease. Electronically Signed   By: Monte Fantasia M.D.   On: 12/07/2017 10:15        Scheduled Meds: . aspirin EC  325 mg Oral Daily  . aspirin  300 mg Rectal Daily  . enoxaparin (LOVENOX) injection  40 mg Subcutaneous Q24H  . insulin aspart  0-9 Units Subcutaneous TID WC  . mouth rinse  15 mL Mouth Rinse BID  . nicotine  21 mg Transdermal Daily  . rosuvastatin  40 mg Oral q1800  . umeclidinium bromide  1 puff Inhalation Daily   Continuous Infusions: . sodium chloride 75 mL/hr at 12/08/17 0627     LOS: 3 days    Time spent: 25 min    Geradine Girt, DO [facility] Pager [phone]  If 7PM-7AM, please contact night-coverage [email or web address] Password [facility] 12/08/2017, 12:42 PM

## 2017-12-08 NOTE — Progress Notes (Signed)
Rehab admissions - I am following for potential acute inpatient rehab admission.  I am opening the case and requesting CIR.  I will update all once I hear back from insurance case manager.  Call me for questions.  #[phone]

## 2017-12-08 NOTE — Progress Notes (Signed)
Multiple calls from Tele alerting of frequent pauses. One as long a 5+ secs. Cardiologist/Internist all aware. No pacemaker recommended at this time per their notes. Will continue to monitor the pt.

## 2017-12-08 NOTE — Telephone Encounter (Signed)
Pt is in inpatient rehab at [facility]. PT had appt today.

## 2017-12-08 NOTE — Progress Notes (Signed)
Physical Therapy Treatment Patient Details Name: Kelly Gibson MRN: [id number] DOB: 02/05/1961 Today's Date: 12/08/2017    History of Present Illness 57 y.o. female with prior history of left brain infarct in March 2019 with residual mild right-sided weakness/numbness who presented with worsening of right hemiplegia. MRI showed 2 new lacunar infarcts in the left periventricular white matter and corona radiata. She has additional PMH of COPD, depression, DM2, HLD, HTN.    PT Comments    Patient seen for activity progression. Remains extremely motivated throughout entirety of session today. Patient showing some RLE activation today. Tolerated some bed level AAROM activity RLE. Additionally, patient able to perform various sitting balance and standing balance activities with use of stedy. Patient progressing well. Feel patient remains a great candidate for comprehensive CIR level therapies. Will continue to see and progress as tolerated.   Follow Up Recommendations  CIR;Supervision/Assistance - 24 hour     Equipment Recommendations  (tbd)    Recommendations for Other Services Rehab consult     Precautions / Restrictions Precautions Precautions: Fall Precaution Comments: bradycardia Restrictions Weight Bearing Restrictions: No    Mobility  Bed Mobility Overal bed mobility: Needs Assistance Bed Mobility: Rolling;Sidelying to Sit Rolling: Min assist Sidelying to sit: Mod assist       General bed mobility comments: Patient was able to move LEs to EOB, noted some active abduction of RLE today. patient utilizing LUE on rail to reposition and pull to upright. Moderate asisst to elevate trunk and position RUE during transtional movement  Transfers Overall transfer level: Needs assistance   Transfers: Sit to/from Stand;Stand Pivot Transfers Sit to Stand: Mod assist         General transfer comment: Patient able to perform sit <> stand with steady using LUE and moderate assist.  Patient initiated activity upon command.  Ambulation/Gait                 Stairs             Wheelchair Mobility    Modified Rankin (Stroke Patients Only) Modified Rankin (Stroke Patients Only) Pre-Morbid Rankin Score: Moderately severe disability Modified Rankin: Severe disability     Balance Overall balance assessment: Needs assistance Sitting-balance support: Feet supported;No upper extremity supported Sitting balance-Leahy Scale: Fair Sitting balance - Comments: close minguard for static sitting    Standing balance support: During functional activity;Single extremity supported Standing balance-Leahy Scale: Poor Standing balance comment: reliant on UE support/external support from therapist                             Cognition Arousal/Alertness: Awake/alert Behavior During Therapy: WFL for tasks assessed/performed   Area of Impairment: Problem solving                             Problem Solving: Slow processing General Comments: slower processing noted and with expressive difficulties       Exercises Other Exercises Other Exercises: ~10 minutes dynamic trunk activity at EOB, patient able to progress from min assist to min guard with use of UE support and trunk stabilization Other Exercises: sit <> stands from various heights x3 in stedy  Other Exercises: mini squats with tactile cues for RLE engagement in stedy with assist    General Comments        Pertinent Vitals/Pain Pain Assessment: No/denies pain    Home Living  Prior Function            PT Goals (current goals can now be found in the care plan section) Acute Rehab PT Goals Patient Stated Goal: regain independence  PT Goal Formulation: With patient Time For Goal Achievement: 12/21/17 Potential to Achieve Goals: Good Progress towards PT goals: Progressing toward goals    Frequency    Min 3X/week      PT Plan       Co-evaluation              AM-PAC PT "6 Clicks" Daily Activity  Outcome Measure  Difficulty turning over in bed (including adjusting bedclothes, sheets and blankets)?: A Lot Difficulty moving from lying on back to sitting on the side of the bed? : Unable Difficulty sitting down on and standing up from a chair with arms (e.g., wheelchair, bedside commode, etc,.)?: Unable Help needed moving to and from a bed to chair (including a wheelchair)?: A Lot Help needed walking in hospital room?: A Lot Help needed climbing 3-5 steps with a railing? : Total 6 Click Score: 9    End of Session Equipment Utilized During Treatment: Gait belt Activity Tolerance: Patient tolerated treatment well Patient left: in chair;with call bell/phone within reach;with chair alarm set;with family/visitor present Nurse Communication: Mobility status PT Visit Diagnosis: Unsteadiness on feet (R26.81);Muscle weakness (generalized) (M62.81);Difficulty in walking, not elsewhere classified (R26.2);Hemiplegia and hemiparesis Hemiplegia - Right/Left: Right Hemiplegia - dominant/non-dominant: Dominant Hemiplegia - caused by: Unspecified     Time: 8676-7209 PT Time Calculation (min) (ACUTE ONLY): 23 min  Charges:  $Therapeutic Activity: 23-37 mins                    G Codes:       Alben Deeds, PT DPT  Board Certified Neurologic Specialist [place] 12/08/2017, 2:59 PM

## 2017-12-08 NOTE — Consult Note (Signed)
Physical Medicine and Rehabilitation Consult Reason for Consult: Right side weakness Referring Physician: [facility]   HPI: Kelly Gibson is a 57 y.o. right-handed female with history of COPD/tobacco abuse, hypertension, hyperlipidemia as well as recent CVA in March with mild right-sided residual weakness and maintained on aspirin 81 mg daily as well as loop recorder.  Per chart review patient lives with spouse.  Two-level home with bed and bath upstairs.  Patient was independent driving and performing all ADLs prior to admission.  Her husband does work during the day they have 3 daughters also in the area.  Presented 12/05/2017 with increasing right-sided weakness, slurred speech and dizziness.  Loop recorder did show 3 to 4-second pauses initial heart rate in the 30s.  MRI of the brain showed small acute lacunar infarction along the left frontal horn periventricular white matter.  Small subacute lacunar infarct in the left posterior corona radiata.  Numerous chronic lacunar infarcts in the deep gray matter nucllei, brainstem and cerebral white matter including the corpus callosum.  CT angiogram of head and neck negative for large vessel occlusion.  Positive for widespread atherosclerosis.  Echocardiogram with ejection fraction of 60% no wall motion abnormalities with grade 1 diastolic dysfunction.  Neurology follow-up patient currently on aspirin 325 mg daily for CVA prophylaxis.  Subcutaneous Lovenox for DVT prophylaxis.  Dysphasia #2 thin liquid diet.  Physical and occupational therapy evaluations completed with recommendations of physical medicine rehab consult.   Review of Systems  Constitutional: Negative for chills and fever.  HENT: Negative for hearing loss.   Eyes: Negative for blurred vision and double vision.  Respiratory: Positive for shortness of breath.   Cardiovascular: Positive for palpitations and leg swelling.  Gastrointestinal: Positive for constipation. Negative for nausea  and vomiting.  Genitourinary: Negative for dysuria, flank pain and hematuria.  Musculoskeletal: Positive for myalgias.  Skin: Negative for rash.  Neurological: Positive for speech change and focal weakness.  All other systems reviewed and are negative.  Past Medical History:  Diagnosis Date  . COPD (chronic obstructive pulmonary disease) ([place])   . Depression   . Diabetes mellitus   . Hyperlipidemia   . Hypertension    Past Surgical History:  Procedure Laterality Date  . CESAREAN SECTION     x 3  . TUMOR REMOVAL     left shoulder   Family History  Problem Relation Age of Onset  . Diabetes Mother   . Heart disease Father   . Cancer Father    Social History:  reports that she has quit smoking. She has never used smokeless tobacco. She reports that she does not drink alcohol or use drugs. Allergies:  Allergies  Allergen Reactions  . Niacin And Related Rash   Medications Prior to Admission  Medication Sig Dispense Refill  . albuterol (PROVENTIL HFA;VENTOLIN HFA) 108 (90 Base) MCG/ACT inhaler Inhale 2 puffs into the lungs every 6 (six) hours as needed for wheezing or shortness of breath. 1 Inhaler 1  . albuterol (PROVENTIL) (2.5 MG/3ML) 0.083% nebulizer solution Take 3 mLs (2.5 mg total) by nebulization every 6 (six) hours as needed for wheezing or shortness of breath. 30 vial 0  . amLODipine (NORVASC) 5 MG tablet Take 1 tablet (5 mg total) by mouth daily. 30 tablet 0  . aspirin EC 81 MG tablet Take 81 mg by mouth daily.    Marland Kitchen glyBURIDE (DIABETA) 2.5 MG tablet One tab daily 30 tablet 6  . hydrochlorothiazide (HYDRODIURIL) 25 MG tablet Take  1 tablet (25 mg total) by mouth daily. 90 tablet 1  . Ibuprofen-diphenhydrAMINE Cit (ADVIL PM PO) Take 1 tablet by mouth as needed (for sleep).    . Insulin Glargine (TOUJEO SOLOSTAR) 300 UNIT/ML SOPN Inject 10 Units into the skin at bedtime. 3 pen 6  . Insulin Pen Needle (BD PEN NEEDLE MICRO U/F) 32G X 6 MM MISC Use as directed to inject  Toujeo SQ QD. (Patient taking differently: No sig reported) 100 each 3  . lisinopril (PRINIVIL,ZESTRIL) 40 MG tablet Take 1 tablet (40 mg total) by mouth daily. 30 tablet 0  . rosuvastatin (CRESTOR) 10 MG tablet Take 1 tablet (10 mg total) by mouth daily. 90 tablet 1  . umeclidinium bromide (INCRUSE ELLIPTA) 62.5 MCG/INH AEPB Inhale 1 puff into the lungs daily. 30 each 6  . Glucose Blood (BLOOD GLUCOSE TEST STRIPS) STRP Please dispense per patient and insurance preference. Use as directed to monitor FSBS  3x daily. Dx: E11.65. 100 each 11  . Lancet Devices MISC Please dispense per patient and insurance preference. Use as directed to monitor FSBS  3x daily. Dx: E11.65. 100 each 11  . potassium chloride SA (K-DUR,KLOR-CON) 20 MEQ tablet Take 1 tablet (20 mEq total) by mouth daily. (Patient not taking: Reported on 11/03/2017) 90 tablet 1    Home: Home Living Family/patient expects to be discharged to:: Private residence Living Arrangements: Spouse/significant other Available Help at Discharge: Family Type of Home: House Home Access: Level entry Home Layout: Two level, Bed/bath upstairs Alternate Level Stairs-Number of Steps: 2 flights with a landing  Bathroom Shower/Tub: Chiropodist: [place]: None Additional Comments: pt's daughter reports family is looking into arranging for 24hr supervision for the summer   Lives With: Spouse  Functional History: Prior Function Level of Independence: Independent Comments: spouse assisting with getting into/out of car Functional Status:  Mobility: Bed Mobility Overal bed mobility: Needs Assistance Bed Mobility: Supine to Sit Supine to sit: +2 for safety/equipment, Max assist General bed mobility comments: assist to advance RLE and scoot hips towards EOB; assist to bring trunk upright into sitting  Transfers Overall transfer level: Needs assistance Equipment used: 2 person hand held assist Transfers: Sit to/from  Stand, Stand Pivot Transfers Sit to Stand: Mod assist, +2 safety/equipment Stand pivot transfers: Mod assist, +2 physical assistance, +2 safety/equipment General transfer comment: Pt completing stand pivot transfer EOB>BSC to the L with ModA+2, R knee blocked for increased stability with pt able to pivot on L foot to assist with transition; completed additional sit<>stand from [facility] using bedrail for LUE support       ADL: ADL Overall ADL's : Needs assistance/impaired Eating/Feeding: Minimal assistance Grooming: Minimal assistance, Sitting Upper Body Bathing: Sitting, Minimal assistance Upper Body Bathing Details (indicate cue type and reason): pt able to wash chest, abdomen, and RUE; assist to wash LUE  Lower Body Bathing: Moderate assistance, +2 for physical assistance, +2 for safety/equipment, Sit to/from stand Lower Body Bathing Details (indicate cue type and reason): assist to wash lower portion of LEs while seated; pt maintaining static standing with modA using LUE support on bedrail (BSC and pt positioned to face bed rail) while additional therapist washes buttocks  Upper Body Dressing : Sitting, Minimal assistance Upper Body Dressing Details (indicate cue type and reason): donning new gown  Lower Body Dressing: Moderate assistance, Sit to/from stand, +2 for physical assistance, +2 for safety/equipment Toilet Transfer: Moderate assistance, +2 for physical assistance, +2 for safety/equipment, Stand-pivot, BSC Toileting- Clothing Manipulation  and Hygiene: Moderate assistance, +2 for physical assistance, +2 for safety/equipment, Sit to/from stand Functional mobility during ADLs: Moderate assistance, +2 for physical assistance, +2 for safety/equipment(for stand pivot transfer) General ADL Comments: pt noted to have soiled gown and bed linens due to pure wick not working; transferred to [facility] to wash up and don new gown prior to transferring to recliner   Cognition: Cognition Overall  Cognitive Status: Impaired/Different from baseline Arousal/Alertness: Awake/alert Orientation Level: Oriented X4 Attention: Selective Selective Attention: Impaired Selective Attention Impairment: Verbal basic, Functional basic Memory: Impaired Memory Impairment: Storage deficit, Retrieval deficit, Decreased recall of new information Awareness: Impaired Awareness Impairment: Emergent impairment, Intellectual impairment Problem Solving: Impaired Problem Solving Impairment: Verbal basic, Functional basic Cognition Arousal/Alertness: Awake/alert Behavior During Therapy: WFL for tasks assessed/performed Overall Cognitive Status: Impaired/Different from baseline Area of Impairment: Problem solving Problem Solving: Slow processing General Comments: slower processing noted and with expressive difficulties   Blood pressure (!) 143/94, pulse 80, temperature 98.2 F (36.8 C), temperature source Oral, resp. rate 20, height 5\' 2"  (1.575 m), weight 70.3 kg (155 lb), last menstrual period 12/10/2012, SpO2 98 %. Physical Exam  Vitals reviewed. Constitutional: She is oriented to person, place, and time. She appears well-developed and well-nourished.  HENT:  Head: Normocephalic.  Eyes: EOM are normal.  Neck: Normal range of motion. Neck supple. No thyromegaly present.  Cardiovascular: Normal rate, regular rhythm and normal heart sounds. Exam reveals no friction rub.  No murmur heard. Cardiac rate controlled 66 bpm  Respiratory: Effort normal and breath sounds normal. No respiratory distress.  GI: Soft. Bowel sounds are normal. She exhibits no distension.  Neurological: She is alert and oriented to person, place, and time. She exhibits abnormal muscle tone. Gait abnormal.  Speech is dysarthric but intelligible.  Follows simple commands  Motor strength is 0/5 in the right deltoid, bicep, tricep, grip, hip flexor, knee extensor, ankle dorsiflexor Sensation intact to light touch in the index through  little finger on the right absent on the thumb, reduced sensation right small toe intact on the large toe to light touch.  Proprioception is intact  Patient has increased tone in the right pronators as well as in the pectoralis.   Skin: Skin is warm and dry.  Psychiatric: She has a normal mood and affect. Her speech is slurred. She is slowed.  Patient is severely dysarthric    Results for orders placed or performed during the hospital encounter of 12/05/17 (from the past 24 hour(s))  CBC     Status: None   Collection Time: 12/07/17  7:17 AM  Result Value Ref Range   WBC 4.6 4.0 - 10.5 K/uL   RBC 4.47 3.87 - 5.11 MIL/uL   Hemoglobin 12.5 12.0 - 15.0 g/dL   HCT 36.9 36.0 - 46.0 %   MCV 82.6 78.0 - 100.0 fL   MCH 28.0 26.0 - 34.0 pg   MCHC 33.9 30.0 - 36.0 g/dL   RDW 13.6 11.5 - 15.5 %   Platelets 388 150 - 400 K/uL  Basic metabolic panel     Status: Abnormal   Collection Time: 12/07/17  7:17 AM  Result Value Ref Range   Sodium 141 135 - 145 mmol/L   Potassium 3.1 (L) 3.5 - 5.1 mmol/L   Chloride 106 101 - 111 mmol/L   CO2 22 22 - 32 mmol/L   Glucose, Bld 86 65 - 99 mg/dL   BUN 11 6 - 20 mg/dL   Creatinine, Ser 0.80 0.44 - 1.00  mg/dL   Calcium 9.2 8.9 - 10.3 mg/dL   GFR calc non Af Amer >60 >60 mL/min   GFR calc Af Amer >60 >60 mL/min   Anion gap 13 5 - 15  Magnesium     Status: None   Collection Time: 12/07/17  7:17 AM  Result Value Ref Range   Magnesium 1.7 1.7 - 2.4 mg/dL  Glucose, capillary     Status: None   Collection Time: 12/07/17 12:17 PM  Result Value Ref Range   Glucose-Capillary 66 65 - 99 mg/dL   Comment 1 Notify RN    Comment 2 Document in Chart   Glucose, capillary     Status: Abnormal   Collection Time: 12/07/17  4:36 PM  Result Value Ref Range   Glucose-Capillary 100 (H) 65 - 99 mg/dL   Comment 1 Notify RN    Comment 2 Document in Chart   Glucose, capillary     Status: None   Collection Time: 12/08/17 12:33 AM  Result Value Ref Range    Glucose-Capillary 90 65 - 99 mg/dL   Comment 1 Notify RN    Comment 2 Document in Chart   Glucose, capillary     Status: None   Collection Time: 12/08/17  6:06 AM  Result Value Ref Range   Glucose-Capillary 92 65 - 99 mg/dL   Comment 1 Notify RN    Comment 2 Document in Chart    Ct Head Wo Contrast  Result Date: 12/07/2017 CLINICAL DATA:  Stroke follow-up EXAM: CT HEAD WITHOUT CONTRAST TECHNIQUE: Contiguous axial images were obtained from the base of the skull through the vertex without intravenous contrast. COMPARISON:  12/05/2017 FINDINGS: Brain: Known acute infarct in the left basal ganglia and left genu of the corpus callosum, the latter very subtle by CT. There is a background of extensive chronic small vessel ischemic injury in the deep gray nuclei and cerebral white matter. Atrophy greatest in the frontal lobes. No hemorrhage, hydrocephalus, or evidence of interval infarct. Vascular: Atherosclerotic calcification.  No hyperdense vessel Skull: No acute or aggressive finding Sinuses/Orbits: Negative IMPRESSION: 1. Known acute small-vessel infarcts. No hemorrhage or detectable progression when compared to brain MRI yesterday. 2. Advanced chronic small vessel disease. Electronically Signed   By: Monte Fantasia M.D.   On: 12/07/2017 10:15     Assessment/Plan: Diagnosis: Left periventricular white matter left corona radiata infarcts causing right hemiparesis and dysarthria as well as dysphagia 1. Does the need for close, 24 hr/day medical supervision in concert with the patient's rehab needs make it unreasonable for this patient to be served in a less intensive setting? Yes 2. Co-Morbidities requiring supervision/potential complications: COPD, hypertension, recent weight loss 3. Due to bladder management, bowel management, safety, skin/wound care, disease management, medication administration and patient education, does the patient require 24 hr/day rehab nursing? Yes 4. Does the patient  require coordinated care of a physician, rehab nurse, PT (1-2 hrs/day, 5 days/week), OT (1-2 hrs/day, 5 days/week) and SLP (.5-1 hrs/day, 5 days/week) to address physical and functional deficits in the context of the above medical diagnosis(es)? Yes Addressing deficits in the following areas: balance, endurance, locomotion, strength, transferring, bowel/bladder control, bathing, dressing, feeding, grooming, toileting, cognition, speech, swallowing and psychosocial support 5. Can the patient actively participate in an intensive therapy program of at least 3 hrs of therapy per day at least 5 days per week? Yes 6. The potential for patient to make measurable gains while on inpatient rehab is excellent 7. Anticipated functional outcomes  upon discharge from inpatient rehab are min assist  with PT, min assist with OT, min assist with SLP. 8. Estimated rehab length of stay to reach the above functional goals is: 18 to 21 days 9. Anticipated D/C setting: Home 10. Anticipated post D/C treatments: [place] therapy 11. Overall Rehab/Functional Prognosis: excellent  RECOMMENDATIONS: This patient's condition is appropriate for continued rehabilitative care in the following setting: CIR Patient has agreed to participate in recommended program. Yes Note that insurance prior authorization may be required for reimbursement for recommended care.  Comment: Patient has work-up for lymphadenopathy, mediastinal adenopathy and weight loss, discussed with Dr Werner Lean M.D. [facility] FAAPM&R (Sports Med, Neuromuscular Med) Diplomate Am Board of Electrodiagnostic Med  Elizabeth Sauer 12/08/2017

## 2017-12-09 ENCOUNTER — Inpatient Hospital Stay (HOSPITAL_COMMUNITY)
Admission: RE | Admit: 2017-12-09 | Discharge: 2017-12-30 | DRG: 057 | Disposition: A | Discharge status: Home Health | Payer: BLUE CROSS/BLUE SHIELD | Source: Intra-hospital | Attending: Physical Medicine & Rehabilitation | Admitting: Physical Medicine & Rehabilitation

## 2017-12-09 ENCOUNTER — Encounter: Payer: Self-pay | Admitting: Neurology

## 2017-12-09 ENCOUNTER — Encounter (HOSPITAL_COMMUNITY): Payer: Self-pay

## 2017-12-09 DIAGNOSIS — I69391 Dysphagia following cerebral infarction: Secondary | ICD-10-CM | POA: Diagnosis not present

## 2017-12-09 DIAGNOSIS — I69321 Dysphasia following cerebral infarction: Secondary | ICD-10-CM

## 2017-12-09 DIAGNOSIS — J449 Chronic obstructive pulmonary disease, unspecified: Secondary | ICD-10-CM | POA: Diagnosis present

## 2017-12-09 DIAGNOSIS — E785 Hyperlipidemia, unspecified: Secondary | ICD-10-CM | POA: Diagnosis present

## 2017-12-09 DIAGNOSIS — I169 Hypertensive crisis, unspecified: Secondary | ICD-10-CM

## 2017-12-09 DIAGNOSIS — R1310 Dysphagia, unspecified: Secondary | ICD-10-CM | POA: Diagnosis present

## 2017-12-09 DIAGNOSIS — R7309 Other abnormal glucose: Secondary | ICD-10-CM

## 2017-12-09 DIAGNOSIS — I69322 Dysarthria following cerebral infarction: Secondary | ICD-10-CM | POA: Diagnosis not present

## 2017-12-09 DIAGNOSIS — K5900 Constipation, unspecified: Secondary | ICD-10-CM | POA: Diagnosis not present

## 2017-12-09 DIAGNOSIS — Z8249 Family history of ischemic heart disease and other diseases of the circulatory system: Secondary | ICD-10-CM

## 2017-12-09 DIAGNOSIS — I69398 Other sequelae of cerebral infarction: Secondary | ICD-10-CM

## 2017-12-09 DIAGNOSIS — I69351 Hemiplegia and hemiparesis following cerebral infarction affecting right dominant side: Secondary | ICD-10-CM | POA: Diagnosis present

## 2017-12-09 DIAGNOSIS — Z833 Family history of diabetes mellitus: Secondary | ICD-10-CM | POA: Diagnosis not present

## 2017-12-09 DIAGNOSIS — E1142 Type 2 diabetes mellitus with diabetic polyneuropathy: Secondary | ICD-10-CM | POA: Diagnosis present

## 2017-12-09 DIAGNOSIS — I6300 Cerebral infarction due to thrombosis of unspecified precerebral artery: Secondary | ICD-10-CM

## 2017-12-09 DIAGNOSIS — R269 Unspecified abnormalities of gait and mobility: Secondary | ICD-10-CM

## 2017-12-09 DIAGNOSIS — Z87891 Personal history of nicotine dependence: Secondary | ICD-10-CM | POA: Diagnosis not present

## 2017-12-09 DIAGNOSIS — Z7982 Long term (current) use of aspirin: Secondary | ICD-10-CM

## 2017-12-09 DIAGNOSIS — Z794 Long term (current) use of insulin: Secondary | ICD-10-CM

## 2017-12-09 DIAGNOSIS — Z809 Family history of malignant neoplasm, unspecified: Secondary | ICD-10-CM

## 2017-12-09 DIAGNOSIS — I10 Essential (primary) hypertension: Secondary | ICD-10-CM | POA: Diagnosis present

## 2017-12-09 DIAGNOSIS — Z888 Allergy status to other drugs, medicaments and biological substances status: Secondary | ICD-10-CM | POA: Diagnosis not present

## 2017-12-09 DIAGNOSIS — N179 Acute kidney failure, unspecified: Secondary | ICD-10-CM | POA: Diagnosis not present

## 2017-12-09 DIAGNOSIS — I152 Hypertension secondary to endocrine disorders: Secondary | ICD-10-CM

## 2017-12-09 DIAGNOSIS — R001 Bradycardia, unspecified: Secondary | ICD-10-CM

## 2017-12-09 DIAGNOSIS — E1165 Type 2 diabetes mellitus with hyperglycemia: Secondary | ICD-10-CM

## 2017-12-09 DIAGNOSIS — I129 Hypertensive chronic kidney disease with stage 1 through stage 4 chronic kidney disease, or unspecified chronic kidney disease: Secondary | ICD-10-CM

## 2017-12-09 DIAGNOSIS — E876 Hypokalemia: Secondary | ICD-10-CM

## 2017-12-09 LAB — BASIC METABOLIC PANEL
ANION GAP: 9 (ref 5–15)
BUN: 14 mg/dL (ref 6–20)
CALCIUM: 9.3 mg/dL (ref 8.9–10.3)
CO2: 24 mmol/L (ref 22–32)
Chloride: 107 mmol/L (ref 101–111)
Creatinine, Ser: 0.81 mg/dL (ref 0.44–1.00)
GFR calc Af Amer: >60 mL/min (ref >60)
GFR calc non Af Amer: >60 mL/min (ref >60)
GLUCOSE: 110 mg/dL — AB (ref 65–99)
Potassium: 3.6 mmol/L (ref 3.5–5.1)
Sodium: 140 mmol/L (ref 135–145)

## 2017-12-09 LAB — GLUCOSE, CAPILLARY
GLUCOSE-CAPILLARY: 96 mg/dL (ref 65–99)
GLUCOSE-CAPILLARY: 119 mg/dL — AB (ref 65–99)
Glucose-Capillary: 95 mg/dL (ref 65–99)

## 2017-12-09 LAB — CBC
HCT: 36.5 % (ref 36.0–46.0)
HEMOGLOBIN: 12.4 g/dL (ref 12.0–15.0)
MCH: 27.9 pg (ref 26.0–34.0)
MCHC: 34 g/dL (ref 30.0–36.0)
MCV: 82.2 fL (ref 78.0–100.0)
Platelets: 380 10*3/uL (ref 150–400)
RBC: 4.44 MIL/uL (ref 3.87–5.11)
RDW: 13.2 % (ref 11.5–15.5)
WBC: 4.7 10*3/uL (ref 4.0–10.5)

## 2017-12-09 MED ORDER — ENOXAPARIN SODIUM 40 MG/0.4ML ~~LOC~~ SOLN: 40.0000 mg | SUBCUTANEOUS | Status: DC

## 2017-12-09 MED ORDER — NICOTINE 21 MG/24HR TD PT24
21.0000 mg | MEDICATED_PATCH | Freq: Every day | TRANSDERMAL | Status: DC
  Administered 2017-12-16: 21 mg via TRANSDERMAL
  Filled 2017-12-09 (×6): qty 1

## 2017-12-09 MED ORDER — UMECLIDINIUM BROMIDE 62.5 MCG/INH IN AEPB
1.0000 | INHALATION_SPRAY | Freq: Every day | RESPIRATORY_TRACT | Status: DC
  Administered 2017-12-10 – 2017-12-30 (×14): 1 via RESPIRATORY_TRACT
  Filled 2017-12-09 (×3): qty 7

## 2017-12-09 MED ORDER — ACETAMINOPHEN 325 MG PO TABS: 650.0000 mg | ORAL_TABLET | ORAL | Status: DC | PRN

## 2017-12-09 MED ORDER — SENNOSIDES-DOCUSATE SODIUM 8.6-50 MG PO TABS
1.0000 | ORAL_TABLET | Freq: Every evening | ORAL | Status: DC | PRN
Start: 2017-12-09 — End: 2017-12-20
  Administered 2017-12-11 – 2017-12-19 (×4): 1 via ORAL
  Filled 2017-12-09 (×4): qty 1

## 2017-12-09 MED ORDER — AMLODIPINE BESYLATE 5 MG PO TABS
5.0000 mg | ORAL_TABLET | Freq: Every day | ORAL | Status: DC
  Administered 2017-12-10 – 2017-12-25 (×16): 5 mg via ORAL
  Filled 2017-12-09 (×16): qty 1

## 2017-12-09 MED ORDER — SENNOSIDES-DOCUSATE SODIUM 8.6-50 MG PO TABS: 1.0000 | ORAL_TABLET | Freq: Every evening | ORAL | Status: DC | PRN

## 2017-12-09 MED ORDER — ROSUVASTATIN CALCIUM 40 MG PO TABS
40.0000 mg | ORAL_TABLET | Freq: Every day | ORAL | Status: DC
  Administered 2017-12-09 – 2017-12-29 (×21): 40 mg via ORAL
  Filled 2017-12-09 (×21): qty 1

## 2017-12-09 MED ORDER — ORAL CARE MOUTH RINSE: 15.0000 mL | Freq: Two times a day (BID) | OROMUCOSAL | 0 refills | Status: DC

## 2017-12-09 MED ORDER — ALBUTEROL SULFATE (2.5 MG/3ML) 0.083% IN NEBU: 2.5000 mg | INHALATION_SOLUTION | Freq: Four times a day (QID) | RESPIRATORY_TRACT | Status: DC | PRN

## 2017-12-09 MED ORDER — INSULIN ASPART 100 UNIT/ML ~~LOC~~ SOLN: 0.0000 [IU] | Freq: Three times a day (TID) | SUBCUTANEOUS | 11 refills | Status: DC

## 2017-12-09 MED ORDER — ASPIRIN 325 MG PO TBEC: 325.0000 mg | DELAYED_RELEASE_TABLET | Freq: Every day | ORAL | 0 refills | Status: DC

## 2017-12-09 MED ORDER — ACETAMINOPHEN 650 MG RE SUPP: 650.0000 mg | RECTAL | Status: DC | PRN

## 2017-12-09 MED ORDER — ENOXAPARIN SODIUM 40 MG/0.4ML ~~LOC~~ SOLN
40.0000 mg | SUBCUTANEOUS | Status: DC
  Administered 2017-12-09 – 2017-12-29 (×21): 40 mg via SUBCUTANEOUS
  Filled 2017-12-09 (×21): qty 0.4

## 2017-12-09 MED ORDER — ACETAMINOPHEN 160 MG/5ML PO SOLN: 650.0000 mg | ORAL | Status: DC | PRN

## 2017-12-09 MED ORDER — ASPIRIN EC 325 MG PO TBEC
325.0000 mg | DELAYED_RELEASE_TABLET | Freq: Every day | ORAL | Status: DC
  Administered 2017-12-10 – 2017-12-30 (×21): 325 mg via ORAL
  Filled 2017-12-09 (×21): qty 1

## 2017-12-09 MED ORDER — LISINOPRIL 40 MG PO TABS
40.0000 mg | ORAL_TABLET | Freq: Every day | ORAL | Status: DC
  Administered 2017-12-10 – 2017-12-19 (×10): 40 mg via ORAL
  Filled 2017-12-09 (×11): qty 1

## 2017-12-09 MED ORDER — INSULIN ASPART 100 UNIT/ML ~~LOC~~ SOLN
0.0000 [IU] | Freq: Three times a day (TID) | SUBCUTANEOUS | Status: DC
  Administered 2017-12-12: 2 [IU] via SUBCUTANEOUS
  Administered 2017-12-12 – 2017-12-13 (×3): 1 [IU] via SUBCUTANEOUS
  Administered 2017-12-14: 2 [IU] via SUBCUTANEOUS
  Administered 2017-12-15: 1 [IU] via SUBCUTANEOUS
  Administered 2017-12-15: 2 [IU] via SUBCUTANEOUS
  Administered 2017-12-15 – 2017-12-16 (×2): 1 [IU] via SUBCUTANEOUS
  Administered 2017-12-16 – 2017-12-17 (×4): 2 [IU] via SUBCUTANEOUS
  Administered 2017-12-17: 1 [IU] via SUBCUTANEOUS
  Administered 2017-12-18: 2 [IU] via SUBCUTANEOUS
  Administered 2017-12-18: 3 [IU] via SUBCUTANEOUS
  Administered 2017-12-18: 1 [IU] via SUBCUTANEOUS
  Administered 2017-12-19: 2 [IU] via SUBCUTANEOUS
  Administered 2017-12-19 – 2017-12-20 (×3): 1 [IU] via SUBCUTANEOUS
  Administered 2017-12-20: 2 [IU] via SUBCUTANEOUS
  Administered 2017-12-21: 1 [IU] via SUBCUTANEOUS
  Administered 2017-12-21: 2 [IU] via SUBCUTANEOUS
  Administered 2017-12-21: 1 [IU] via SUBCUTANEOUS
  Administered 2017-12-22: 3 [IU] via SUBCUTANEOUS
  Administered 2017-12-22 – 2017-12-23 (×2): 1 [IU] via SUBCUTANEOUS
  Administered 2017-12-23 – 2017-12-24 (×2): 2 [IU] via SUBCUTANEOUS
  Administered 2017-12-24 – 2017-12-25 (×2): 3 [IU] via SUBCUTANEOUS
  Administered 2017-12-28 – 2017-12-30 (×8): 1 [IU] via SUBCUTANEOUS

## 2017-12-09 MED ORDER — NICOTINE 21 MG/24HR TD PT24: 21.0000 mg | MEDICATED_PATCH | Freq: Every day | TRANSDERMAL | 0 refills | Status: DC

## 2017-12-09 NOTE — Progress Notes (Signed)
Tele notified of a 7.03 sec pause which has gotten longer since 1900. Text paged on call Hospitalist and awaiting return response.

## 2017-12-09 NOTE — Progress Notes (Signed)
Occupational Therapy Treatment Patient Details Name: Kelly Gibson MRN: [id number] DOB: 04/07/1961 Today's Date: 12/09/2017    History of present illness 57 y.o. female with prior history of left brain infarct in March 2019 with residual mild right-sided weakness/numbness who presented with worsening of right hemiplegia. MRI showed 2 new lacunar infarcts in the left periventricular white matter and corona radiata. She has additional PMH of COPD, depression, DM2, HLD, HTN.   OT comments  Pt progressing towards OT goals, presents supine in bed pleasant and willing to participate in therapy session. Pt completing bed mobility with MaxA, use of Stedy for transfer to recliner this session with [place] for sit<>stand at [facility]. Additional focus on sitting balance with pt overall requiring LUE support and/or MinA from therapist, though is able to intermittently maintain static sitting EOB with close minguard for safety. Pt completing simple grooming ADLs and setup for lunch after transfer to recliner. Anticipating d/c to CIR later today. Will continue to follow while pt remains in acute setting for progress towards established OT goals.   Follow Up Recommendations  CIR;Supervision/Assistance - 24 hour    Equipment Recommendations  Other (comment)(TBD in next venue )          Precautions / Restrictions Precautions Precautions: Fall Precaution Comments: bradycardia Restrictions Weight Bearing Restrictions: No       Mobility Bed Mobility Overal bed mobility: Needs Assistance Bed Mobility: Supine to Sit     Supine to sit: Max assist     General bed mobility comments: pt demonstrating some active movement in RLE today when attempting bed mobility, requiring assist to fully advance RLE over EOB; ModA to elevate trunk into sitting, pt using LUE and bedrail to assist with bringing trunk upright   Transfers Overall transfer level: Needs assistance Equipment used: Ambulation equipment  used Transfers: Sit to/from Stand Sit to Stand: Mod assist         General transfer comment: Patient able to perform sit <> stand with stedy using LUE and moderate assist. Patient initiated activity upon command.    Balance Overall balance assessment: Needs assistance Sitting-balance support: Feet supported;No upper extremity supported;Single extremity supported Sitting balance-Leahy Scale: Fair Sitting balance - Comments: close minguard for static sitting  Postural control: Right lateral lean Standing balance support: During functional activity;Single extremity supported Standing balance-Leahy Scale: Poor Standing balance comment: reliant on UE support/external support from therapist                            ADL either performed or assessed with clinical judgement   ADL Overall ADL's : Needs assistance/impaired Eating/Feeding: Set up;Sitting Eating/Feeding Details (indicate cue type and reason): supported sitting; assist to open containers Grooming: Set up;Sitting;Wash/dry face Grooming Details (indicate cue type and reason): supported sitting                               General ADL Comments: pt completing bed mobility and working on static sitting balance; pt able to maintain static sitting briefly without UE support and close minguard, overall reliant on LUE support on bedrail and/or minA from therapist; use of Stedy for transfer to recliner and setup for simple grooming ADLs and to eat lunch; completed PROM to RUE while seated in recliner                        Cognition Arousal/Alertness: Awake/alert Behavior  During Therapy: WFL for tasks assessed/performed Overall Cognitive Status: Impaired/Different from baseline Area of Impairment: Problem solving;Following commands                       Following Commands: Follows one step commands consistently     Problem Solving: Slow processing General Comments: slower processing  noted and with expressive difficulties        Exercises General Exercises - Upper Extremity Shoulder Flexion: PROM;Right;5 reps;Seated;Limitations Shoulder Flexion Limitations: 0-90* this session Shoulder ABduction: PROM;5 reps;Right;Seated;Limitations Shoulder Abduction Limitations: 0-90*, PROM/stretching Shoulder ADduction: PROM;Right;5 reps;Seated Elbow Flexion: PROM;Right;5 reps;Seated Elbow Extension: PROM;Right;Seated;5 reps Wrist Flexion: PROM;Right;5 reps;Seated Wrist Extension: PROM;Right;5 reps;Seated Digit Composite Flexion: PROM;5 reps;Right;Seated Composite Extension: PROM;Right;5 reps;Seated Hand Exercises Forearm Supination: PROM;5 reps;Right;Seated Forearm Pronation: PROM;5 reps;Right;Seated                Pertinent Vitals/ Pain       Pain Assessment: No/denies pain                   Alternate Level Stairs-Number of Steps: 2 flights with a landing                                       Frequency  Min 3X/week        Progress Toward Goals  OT Goals(current goals can now be found in the care plan section)  Progress towards OT goals: Progressing toward goals  Acute Rehab OT Goals Patient Stated Goal: regain independence  OT Goal Formulation: With patient Time For Goal Achievement: 12/21/17 Potential to Achieve Goals: Good  Plan Discharge plan remains appropriate                     AM-PAC PT "6 Clicks" Daily Activity     Outcome Measure   Help from another person eating meals?: A Little Help from another person taking care of personal grooming?: A Little Help from another person toileting, which includes using toliet, bedpan, or urinal?: A Lot Help from another person bathing (including washing, rinsing, drying)?: A Lot Help from another person to put on and taking off regular upper body clothing?: A Lot Help from another person to put on and taking off regular lower body clothing?: A Lot 6 Click Score: 14     End of Session Equipment Utilized During Treatment: Gait belt;Other (comment)(Stedy)  OT Visit Diagnosis: Muscle weakness (generalized) (M62.81);Hemiplegia and hemiparesis Hemiplegia - Right/Left: Right Hemiplegia - dominant/non-dominant: Dominant Hemiplegia - caused by: Cerebral infarction   Activity Tolerance Patient tolerated treatment well   Patient Left in chair;with call bell/phone within reach   Nurse Communication Mobility status;Other (comment);Need for lift equipment(status of chair alarm )        Time: 1140-1207 OT Time Calculation (min): 27 min  Charges: OT General Charges $OT Visit: 1 Visit OT Treatments $Self Care/Home Management : 8-22 mins $Therapeutic Activity: 8-22 mins  Kelly Gibson, OT Pager [phone] 12/09/2017   Kelly Gibson 12/09/2017, 12:50 PM

## 2017-12-09 NOTE — Progress Notes (Signed)
Rehab admissions - I spoke with patient today and I left a message on husband's phone.  Patient does not have coverage for CIR.  Patient is aware that her plan is limited to preventative services.  Patient does want inpatient rehab admission.  Bed available and will admit to acute inpatient rehab today.  Call me for questions.  #[phone]

## 2017-12-09 NOTE — Consult Note (Addendum)
ELECTROPHYSIOLOGY CONSULT NOTE    Patient ID: Kelly Gibson MRN: [id number], DOB/AGE: Mar 29, 1961 57 y.o.  Admit date: 12/05/2017 Date of Consult: 12/09/2017  Primary Physician: Alycia Rossetti, MD Primary Cardiologist: [facility] Electrophysiologist: Monifah Freehling (new this admission)  Patient Profile: Kelly Gibson is a 57 y.o. female with a history of recurrent CVA, COPD, depression, diabetes, and hypertension who is being seen today for the evaluation of bradycardia at the request of Dr Eliseo Squires.  HPI:  Kelly Gibson is a 57 y.o. female with the above past medical history. During admission 10/26/17 she was noted to have some bradycardia and was discharged with event monitor. Monitor showed intermittent pauses up to 3 seconds that were predominately nocturnal.  There were no associated symptoms. She presented back to the hospital 12/05/17 with stroke.  Telemetry this admission has demonstrated sinus rhythm, nocturnal sinus pauses, occasional daytime pauses. The patient denies symptoms when staff has checked on her related to pauses. She does have some intermittent dizziness but is unable to quantify or better describe at this time.  She denies syncope, pre-syncope, chest pain or shortness of breath. No recent fevers or chills.  Echo 12/06/17 demonstrated EF 55-60%, no RWMA, grade 1 diastolic dysfunction, mild MR, +PFO.   Past Medical History:  Diagnosis Date  . COPD (chronic obstructive pulmonary disease) ([place])   . Depression   . Diabetes mellitus   . Hyperlipidemia   . Hypertension      Surgical History:  Past Surgical History:  Procedure Laterality Date  . CESAREAN SECTION     x 3  . TUMOR REMOVAL     left shoulder     Medications Prior to Admission  Medication Sig Dispense Refill Last Dose  . albuterol (PROVENTIL HFA;VENTOLIN HFA) 108 (90 Base) MCG/ACT inhaler Inhale 2 puffs into the lungs every 6 (six) hours as needed for wheezing or shortness of breath. 1 Inhaler 1  at prn  .  albuterol (PROVENTIL) (2.5 MG/3ML) 0.083% nebulizer solution Take 3 mLs (2.5 mg total) by nebulization every 6 (six) hours as needed for wheezing or shortness of breath. 30 vial 0  at prn  . amLODipine (NORVASC) 5 MG tablet Take 1 tablet (5 mg total) by mouth daily. 30 tablet 0 12/04/2017 at Unknown time  . aspirin EC 81 MG tablet Take 81 mg by mouth daily.   12/05/2017 at Unknown time  . glyBURIDE (DIABETA) 2.5 MG tablet One tab daily 30 tablet 6 12/05/2017 at Unknown time  . hydrochlorothiazide (HYDRODIURIL) 25 MG tablet Take 1 tablet (25 mg total) by mouth daily. 90 tablet 1 12/05/2017 at Unknown time  . Ibuprofen-diphenhydrAMINE Cit (ADVIL PM PO) Take 1 tablet by mouth as needed (for sleep).   Past Week at Unknown time  . Insulin Glargine (TOUJEO SOLOSTAR) 300 UNIT/ML SOPN Inject 10 Units into the skin at bedtime. 3 pen 6 12/04/2017 at Unknown time  . Insulin Pen Needle (BD PEN NEEDLE MICRO U/F) 32G X 6 MM MISC Use as directed to inject Toujeo SQ QD. (Patient taking differently: No sig reported) 100 each 3 12/04/2017 at Unknown time  . lisinopril (PRINIVIL,ZESTRIL) 40 MG tablet Take 1 tablet (40 mg total) by mouth daily. 30 tablet 0 12/03/2017  . rosuvastatin (CRESTOR) 10 MG tablet Take 1 tablet (10 mg total) by mouth daily. 90 tablet 1 12/04/2017 at Unknown time  . umeclidinium bromide (INCRUSE ELLIPTA) 62.5 MCG/INH AEPB Inhale 1 puff into the lungs daily. 30 each 6 12/05/2017 at Unknown time  . Glucose Blood (  BLOOD GLUCOSE TEST STRIPS) STRP Please dispense per patient and insurance preference. Use as directed to monitor FSBS  3x daily. Dx: E11.65. 100 each 11 Taking  . Lancet Devices MISC Please dispense per patient and insurance preference. Use as directed to monitor FSBS  3x daily. Dx: E11.65. 100 each 11 Taking  . potassium chloride SA (K-DUR,KLOR-CON) 20 MEQ tablet Take 1 tablet (20 mEq total) by mouth daily. (Patient not taking: Reported on 11/03/2017) 90 tablet 1 Not Taking    Inpatient  Medications:  . amLODipine  5 mg Oral Daily  . aspirin EC  325 mg Oral Daily  . enoxaparin (LOVENOX) injection  40 mg Subcutaneous Q24H  . insulin aspart  0-9 Units Subcutaneous TID WC  . lisinopril  40 mg Oral Daily  . mouth rinse  15 mL Mouth Rinse BID  . nicotine  21 mg Transdermal Daily  . rosuvastatin  40 mg Oral q1800  . umeclidinium bromide  1 puff Inhalation Daily    Allergies:  Allergies  Allergen Reactions  . Niacin And Related Rash    Social History   Socioeconomic History  . Marital status: Married    Spouse name: Not on file  . Number of children: Not on file  . Years of education: Not on file  . Highest education level: Not on file  Occupational History  . Not on file  Social Needs  . Financial resource strain: Not on file  . Food insecurity:    Worry: Not on file    Inability: Not on file  . Transportation needs:    Medical: Not on file    Non-medical: Not on file  Tobacco Use  . Smoking status: Former Research scientist (life sciences)  . Smokeless tobacco: Never Used  . Tobacco comment: stopped 2018  Substance and Sexual Activity  . Alcohol use: No  . Drug use: No  . Sexual activity: Not on file  Lifestyle  . Physical activity:    Days per week: Not on file    Minutes per session: Not on file  . Stress: Not on file  Relationships  . Social connections:    Talks on phone: Not on file    Gets together: Not on file    Attends religious service: Not on file    Active member of club or organization: Not on file    Attends meetings of clubs or organizations: Not on file    Relationship status: Not on file  . Intimate partner violence:    Fear of current or ex partner: Not on file    Emotionally abused: Not on file    Physically abused: Not on file    Forced sexual activity: Not on file  Other Topics Concern  . Not on file  Social History Narrative  . Not on file     Family History  Problem Relation Age of Onset  . Diabetes Mother   . Heart disease Father   .  Cancer Father      Review of Systems: All other systems reviewed and are otherwise negative except as noted above.  Physical Exam: Vitals:   12/08/17 2146 12/09/17 0030 12/09/17 0322 12/09/17 0814  BP: (!) 154/104 (!) 151/91 137/61 (!) 172/97  Pulse: 79 82 80 92  Resp:  20 16 18   Temp:  98.1 F (36.7 C) 97.8 F (36.6 C) 98 F (36.7 C)  TempSrc:  Oral Oral Oral  SpO2:  99% 98% 98%  Weight:      Height:  GEN- The patient is elderly and chronically ill appearing, alert and oriented x 3 today.   HEENT: normocephalic, atraumatic; sclera clear, conjunctiva pink; hearing intact; oropharynx clear; neck supple Lungs- Clear to ausculation bilaterally, normal work of breathing.  No wheezes, rales, rhonchi Heart- Regular rate and rhythm  GI- soft, non-tender, non-distended, bowel sounds present Extremities- no clubbing, cyanosis, or edema  MS- no significant deformity or atrophy Skin- warm and dry, no rash or lesion Psych- euthymic mood, full affect Neuro- strength and sensation are intact  Labs:   Lab Results  Component Value Date   WBC 4.7 12/09/2017   HGB 12.4 12/09/2017   HCT 36.5 12/09/2017   MCV 82.2 12/09/2017   PLT 380 12/09/2017    Recent Labs  Lab 12/06/17 0459  12/09/17 0413  NA 141   < > 140  K 2.8*   < > 3.6  CL 104   < > 107  CO2 25   < > 24  BUN 15   < > 14  CREATININE 0.83   < > 0.81  CALCIUM 9.1   < > 9.3  PROT 7.2  --   --   BILITOT 0.4  --   --   ALKPHOS 65  --   --   ALT 9*  --   --   AST 16  --   --   GLUCOSE 109*   < > 110*   < > = values in this interval not displayed.      Radiology/Studies: Ct Angio Head W Or Wo Contrast  Result Date: 12/05/2017 CLINICAL DATA:  57 year old female with acute and subacute on chronic small vessel infarcts demonstrated on MRI today performed for dizziness and difficulty walking. EXAM: CT ANGIOGRAPHY HEAD AND NECK TECHNIQUE: Multidetector CT imaging of the head and neck was performed using the standard  protocol during bolus administration of intravenous contrast. Multiplanar CT image reconstructions and MIPs were obtained to evaluate the vascular anatomy. Carotid stenosis measurements (when applicable) are obtained utilizing NASCET criteria, using the distal internal carotid diameter as the denominator. CONTRAST:  18mL ISOVUE-370 IOPAMIDOL (ISOVUE-370) INJECTION 76% COMPARISON:  Brain MRI 1659 hours today. FINDINGS: CTA NECK Skeleton: Occasional carious dentition. No acute osseous abnormality identified. Upper chest: Negative lung apices, but abnormally increased mediastinal lymph nodes, such as the anterior carina node on series 5, image 167. There may also be increased hilar nodes. Other neck: The glottis is closed.  No neck mass or lymphadenopathy. Aortic arch: 3 vessel arch configuration. Abundant soft plaque of the arch. Minimal calcified plaque probably just proximal to the brachiocephalic artery origin rather than ulcerated plaque (series 8, image 131). Right carotid system: No right CCA origin stenosis despite soft plaque. Minimal plaque at the right carotid bifurcation. No cervical right ICA stenosis, but the vessel is tortuous just below the skull base. Left carotid system: No stenosis despite soft plaque at the left CCA origin. Tortuous proximal left CCA. Soft plaque at the left ICA origin and bulb but no stenosis. Tortuous left ICA just below the skull base. Vertebral arteries: No proximal right subclavian artery or right vertebral artery origin stenosis despite soft plaque. The right vertebral artery is dominant and tortuous in the neck but patent to the skull base without stenosis. Abundant proximal left subclavian soft plaques similar to that in the arch. Focal low-density plaque is evident on series 5, image 145. Still there is no hemodynamically significant left subclavian stenosis. Likewise, only mild left vertebral artery origin stenosis  due to soft plaque. The left vertebral artery is non  dominant and tortuous in the neck. No cervical left vertebral stenosis. CTA HEAD Posterior circulation: Mildly dominant distal right vertebral artery without stenosis. Dominant right AICA. Patent distal left vertebral artery with moderate stenosis affecting a long segment of the distal V4 just proximal to the vertebrobasilar junction (series 7, image 132). The left PICA origin is patent. Patent basilar artery with mild irregularity proximally, and mild to moderate circumferential stenosis distally proximal to the basilar tip (series 12, image 23). The SCA and PCA origins remain patent. There is mild irregularity throughout the right PCA, but no stenosis or occlusion. Lesser left PCA irregularity. Posterior communicating arteries are diminutive or absent. Anterior circulation: Both ICA siphons are patent. There is mild irregularity throughout both siphons with intermittent calcified plaque. No significant siphon stenosis. Normal ophthalmic artery origins. Patent carotid termini. Patent MCA and ACA origins. The proximal ACA is are within normal limits. There is mild distal ACA irregularity. The left MCA M1 segment is mildly irregular and bifurcates early. No significant stenosis or left MCA branch occlusion identified. Mild left MCA branch irregularity. The right MCA is patent without stenosis. There is moderate irregularity and stenosis at the origin of the dominant right M2 posterior branch (series 11, image 15. Mild right MCA branch irregularity otherwise. Venous sinuses: Patent on the delayed images. Anatomic variants: Mildly dominant right vertebral artery. Delayed phase: No abnormal enhancement identified. Stable gray-white matter differentiation throughout the brain. Review of the MIP images confirms the above findings IMPRESSION: 1. Negative for large vessel occlusion. 2. Positive for widespread atherosclerosis in the head, neck, and chest. Abundant soft plaque of the aortic arch and the proximal great  vessels. 3. No hemodynamically significant stenosis in the neck, but Moderate intracranial stenoses: - distal Left vertebral artery. - distal 3rd Basilar Artery proximal to the SCA origins. - Right MCA posterior M2 branch origin. 4.  Stable CT appearance of the brain since 1250 hours today. 5. Nonspecific mediastinal lymphadenopathy is partially visible. RECOMMEND routine follow-up Chest CT (IV contrast preferred) further characterize. Electronically Signed   By: Genevie Ann M.D.   On: 12/05/2017 18:55   Ct Head Wo Contrast  Result Date: 12/07/2017 CLINICAL DATA:  Stroke follow-up EXAM: CT HEAD WITHOUT CONTRAST TECHNIQUE: Contiguous axial images were obtained from the base of the skull through the vertex without intravenous contrast. COMPARISON:  12/05/2017 FINDINGS: Brain: Known acute infarct in the left basal ganglia and left genu of the corpus callosum, the latter very subtle by CT. There is a background of extensive chronic small vessel ischemic injury in the deep gray nuclei and cerebral white matter. Atrophy greatest in the frontal lobes. No hemorrhage, hydrocephalus, or evidence of interval infarct. Vascular: Atherosclerotic calcification.  No hyperdense vessel Skull: No acute or aggressive finding Sinuses/Orbits: Negative IMPRESSION: 1. Known acute small-vessel infarcts. No hemorrhage or detectable progression when compared to brain MRI yesterday. 2. Advanced chronic small vessel disease. Electronically Signed   By: Monte Fantasia M.D.   On: 12/07/2017 10:15   Ct Head Wo Contrast  Result Date: 12/05/2017 CLINICAL DATA:  Dizziness and difficulty walking beginning this morning. Normal last night. EXAM: CT HEAD WITHOUT CONTRAST TECHNIQUE: Contiguous axial images were obtained from the base of the skull through the vertex without intravenous contrast. COMPARISON:  CT and MRI 10/26/2017 FINDINGS: Brain: Generalized atrophy. Chronic small-vessel changes affecting the pons. Old small vessel infarctions  affecting the thalami and basal ganglia. Chronic small-vessel ischemic changes  of the white matter. No sign of acute infarction, mass lesion, hemorrhage, hydrocephalus or extra-axial collection. Vascular: There is atherosclerotic calcification of the major vessels at the base of the brain. Skull: Negative Sinuses/Orbits: Clear/normal Other: None IMPRESSION: No acute finding by CT. Extensive old ischemic changes throughout the brain as outlined above. Electronically Signed   By: Nelson Chimes M.D.   On: 12/05/2017 13:03   Ct Angio Neck W Or Wo Contrast  Result Date: 12/05/2017 CLINICAL DATA:  57 year old female with acute and subacute on chronic small vessel infarcts demonstrated on MRI today performed for dizziness and difficulty walking. EXAM: CT ANGIOGRAPHY HEAD AND NECK TECHNIQUE: Multidetector CT imaging of the head and neck was performed using the standard protocol during bolus administration of intravenous contrast. Multiplanar CT image reconstructions and MIPs were obtained to evaluate the vascular anatomy. Carotid stenosis measurements (when applicable) are obtained utilizing NASCET criteria, using the distal internal carotid diameter as the denominator. CONTRAST:  71mL ISOVUE-370 IOPAMIDOL (ISOVUE-370) INJECTION 76% COMPARISON:  Brain MRI 1659 hours today. FINDINGS: CTA NECK Skeleton: Occasional carious dentition. No acute osseous abnormality identified. Upper chest: Negative lung apices, but abnormally increased mediastinal lymph nodes, such as the anterior carina node on series 5, image 167. There may also be increased hilar nodes. Other neck: The glottis is closed.  No neck mass or lymphadenopathy. Aortic arch: 3 vessel arch configuration. Abundant soft plaque of the arch. Minimal calcified plaque probably just proximal to the brachiocephalic artery origin rather than ulcerated plaque (series 8, image 131). Right carotid system: No right CCA origin stenosis despite soft plaque. Minimal plaque at the  right carotid bifurcation. No cervical right ICA stenosis, but the vessel is tortuous just below the skull base. Left carotid system: No stenosis despite soft plaque at the left CCA origin. Tortuous proximal left CCA. Soft plaque at the left ICA origin and bulb but no stenosis. Tortuous left ICA just below the skull base. Vertebral arteries: No proximal right subclavian artery or right vertebral artery origin stenosis despite soft plaque. The right vertebral artery is dominant and tortuous in the neck but patent to the skull base without stenosis. Abundant proximal left subclavian soft plaques similar to that in the arch. Focal low-density plaque is evident on series 5, image 145. Still there is no hemodynamically significant left subclavian stenosis. Likewise, only mild left vertebral artery origin stenosis due to soft plaque. The left vertebral artery is non dominant and tortuous in the neck. No cervical left vertebral stenosis. CTA HEAD Posterior circulation: Mildly dominant distal right vertebral artery without stenosis. Dominant right AICA. Patent distal left vertebral artery with moderate stenosis affecting a long segment of the distal V4 just proximal to the vertebrobasilar junction (series 7, image 132). The left PICA origin is patent. Patent basilar artery with mild irregularity proximally, and mild to moderate circumferential stenosis distally proximal to the basilar tip (series 12, image 23). The SCA and PCA origins remain patent. There is mild irregularity throughout the right PCA, but no stenosis or occlusion. Lesser left PCA irregularity. Posterior communicating arteries are diminutive or absent. Anterior circulation: Both ICA siphons are patent. There is mild irregularity throughout both siphons with intermittent calcified plaque. No significant siphon stenosis. Normal ophthalmic artery origins. Patent carotid termini. Patent MCA and ACA origins. The proximal ACA is are within normal limits. There is  mild distal ACA irregularity. The left MCA M1 segment is mildly irregular and bifurcates early. No significant stenosis or left MCA branch occlusion identified. Mild left  MCA branch irregularity. The right MCA is patent without stenosis. There is moderate irregularity and stenosis at the origin of the dominant right M2 posterior branch (series 11, image 15. Mild right MCA branch irregularity otherwise. Venous sinuses: Patent on the delayed images. Anatomic variants: Mildly dominant right vertebral artery. Delayed phase: No abnormal enhancement identified. Stable gray-white matter differentiation throughout the brain. Review of the MIP images confirms the above findings IMPRESSION: 1. Negative for large vessel occlusion. 2. Positive for widespread atherosclerosis in the head, neck, and chest. Abundant soft plaque of the aortic arch and the proximal great vessels. 3. No hemodynamically significant stenosis in the neck, but Moderate intracranial stenoses: - distal Left vertebral artery. - distal 3rd Basilar Artery proximal to the SCA origins. - Right MCA posterior M2 branch origin. 4.  Stable CT appearance of the brain since 1250 hours today. 5. Nonspecific mediastinal lymphadenopathy is partially visible. RECOMMEND routine follow-up Chest CT (IV contrast preferred) further characterize. Electronically Signed   By: Genevie Ann M.D.   On: 12/05/2017 18:55   Ct Chest W Contrast  Result Date: 12/08/2017 CLINICAL DATA:  Mediastinal adenopathy, unintended weight loss, nonlocalized abdominal pain EXAM: CT CHEST WITH CONTRAST TECHNIQUE: Multidetector CT imaging of the chest was performed during intravenous contrast administration. Sagittal and coronal MPR images reconstructed from axial data set. CONTRAST:  125mL OMNIPAQUE IOHEXOL 300 MG/ML  SOLN IV COMPARISON:  None; correlation chest radiograph 08/15/2016 FINDINGS: Cardiovascular: Aorta normal caliber with minimal atherosclerotic calcification. Extensive coronary arterial  calcifications. Minimal pericardial effusion. Central pulmonary arteries grossly patent on nondedicated exam. Mediastinum/Nodes: Multiple normal sized to mildly enlarged mediastinal and hilar lymph nodes are identified. AP window node 13 mm short axis image 65. RIGHT hilar node 14 mm short axis image 62. RIGHT paratracheal node 13 mm short axis image 52. Base of cervical region normal appearance. Mild nonspecific with stranding of fat at the superior LEFT axilla. Esophagus unremarkable. Lungs/Pleura: Diffuse peribronchial thickening. Subsegmental atelectasis LEFT lower lobe. Minimal subsegmental atelectasis in RIGHT middle lobe. 5 mm nodular density RIGHT upper lobe image 47. No acute infiltrate, pleural effusion, or pneumothorax. Upper Abdomen: Visualized upper abdomen unremarkable. Musculoskeletal: No acute osseous findings. IMPRESSION: Nonspecific mildly enlarged mediastinal and BILATERAL hilar lymph nodes; these could be seen as a reactive process, metastatic disease, lymphoma, and Castleman disease. The lack of nodal calcifications makes sarcoidosis less likely. Moderate bronchitic changes with subsegmental atelectasis LEFT lower lobe. Extensive coronary arterial calcifications and minimal pericardial effusion. 5 mm RIGHT upper lobe nodule, recommendation below. No follow-up needed if patient is low-risk. Non-contrast chest CT can be considered in 12 months if patient is high-risk. This recommendation follows the consensus statement: Guidelines for Management of Incidental Pulmonary Nodules Detected on CT Images: From the Fleischner Society 2017; Radiology 2017; 284:228-243. Aortic Atherosclerosis (ICD10-I70.0). Electronically Signed   By: Lavonia Dana M.D.   On: 12/08/2017 13:05   Mr Brain Wo Contrast  Result Date: 12/05/2017 CLINICAL DATA:  57 year old female with dizziness, difficulty walking beginning this morning. EXAM: MRI HEAD WITHOUT CONTRAST TECHNIQUE: Multiplanar, multiecho pulse sequences of the  brain and surrounding structures were obtained without intravenous contrast. COMPARISON:  Head CT without contrast 1250 hours today. Brain MRI 10/26/2017, and earlier. FINDINGS: Brain: There are 2 small areas of abnormal diffusion in the left frontal lobe. A nodular focus of restriction in the left periventricular white matter along the anterior left frontal horn is noted on series 501, image 58 (ADC map image 22). A patchy curvilinear area in the  left corona radiata appears only partially restricted (image 57). Both there is demonstrate T2 and FLAIR hyperintensity. No associated hemorrhage or mass effect. No other No restricted diffusion or evidence of acute infarction. Abnormal T2 and FLAIR signal in the pons, throughout the bilateral deep gray matter nuclei, and cerebral white matter most resembling advanced chronic small vessel ischemia. Focal involvement of the deep white matter capsules, and also the anterior body of the corpus callosum which is abnormally thinned (series 7001, image 12), and mild chronic blood products also noted at this site (series 11001, image 40). Chronic hemorrhage also in the left deep gray matter nuclei and lower brainstem (image 15). The cerebellar hemispheres appear spared. No cerebral cortical encephalomalacia is identified. No midline shift, mass effect, evidence of mass lesion, ventriculomegaly, extra-axial collection or acute intracranial hemorrhage. Cervicomedullary junction and pituitary are within normal limits. Vascular: Major intracranial vascular flow voids are stable. Skull and upper cervical spine: Negative visible cervical spine. Visualized bone marrow signal is within normal limits. Sinuses/Orbits: Stable and negative. Other: Mastoid air cells remain clear. Grossly normal visible internal auditory structures. Negative scalp and face soft tissues. IMPRESSION: 1. Small acute lacunar infarct along the left frontal horn periventricular white matter. Small subacute lacunar  infarct in the left posterior corona radiata. No acute hemorrhage or mass effect. 2. Underlying very advanced chronic small vessel disease redemonstrated. Numerous chronic lacunar infarcts in the deep gray matter nuclei, brainstem, and cerebral white matter including the corpus callosum. Electronically Signed   By: Genevie Ann M.D.   On: 12/05/2017 17:34    EKG:SR, PAC's (personally reviewed)  TELEMETRY: sinus rhythm with intermittent sinus pauses (personally reviewed)  Assessment/Plan: 1.  Sinus pauses Dr Rayann Heman has seen today Not clearly symptomatic Would follow for now Avoid AVN blocking agents Continue event monitor at discharge If she develops syncope or pre-syncope, could consider PPM implant Ok from our standpoint to go to rehab  Electrophysiology team to see as needed while here. Please call with questions.  Signed, Chanetta Marshall, NP 12/09/2017 9:37 AM  I have seen, examined the patient, and reviewed the above assessment and plan.  Changes to above are made where necessary.  Pt with sinus bradycardia episodes.  No clear symptoms.  Primarily nocturnal and likely vagal in etiology.  She denies presyncope or syncope.  I would therefore advise conservative measures at this time.  Continue event monitor on discharge as well as follow-up with Dr Debara Pickett.  Outpatient sleep study once improved from stroke. No inpatient EP eval planned at this time. OK to discharge from my standpoint  Electrophysiology team to see as needed while here. Please call with questions.  Co Sign: Thompson Grayer, MD 12/09/2017 2:43 PM

## 2017-12-09 NOTE — Discharge Summary (Signed)
Physician Discharge Summary  [name] WUJ:[id number] DOB: 1960/10/23 DOA: 12/05/2017  PCP: Alycia Rossetti, MD  Admit date: 12/05/2017 Discharge date: 12/09/2017  Admitted From: home Discharge disposition: CIR   Recommendations for Outpatient Follow-Up:   1. To CIR 2. Outpatient sleep study 3. Outpatient cardiology follow up 4. CT chest 1 year to follow up lung nodule 5. SSI-- resume lantus when eating breakfast   Discharge Diagnosis:   Principal Problem:   Cerebrovascular disease Active Problems:   Diabetes mellitus type II, uncontrolled ([place])   Essential hypertension, benign   Tobacco user   Uncontrolled hypertension   Acute CVA (cerebrovascular accident) [facility])    Discharge Condition: Improved.  Diet recommendation: DYS 2 diet- carb mod  Wound care: None.  Code status: Full.   History of Present Illness:   Kelly Gibson is a 57 y.o. female with medical history significant of diabetes, hypertension, hyperlipidemia, COPD as well as recent CVA in March of this ear that left her with residual right sided weakness. Patient came to the ER today complaining of dizziness. She woke up this morning and felt weak n the same side that felt like her previous CVA. She also felt dizzy. Patient said she was able to do all her ADLs prior to these.She was improving from her previous stroke in March. She has a loop recorder in place which showed about 2 seconds pauses last night. In the ER patient was evaluated and found to have a new infarct. She's only been evaluated by neurology and we are admitting her for further workup.     Hospital Course by Problem:   acute CVA: -ASA -echo: positive bubble study but duplex negative -CTA: No hemodynamically significant stenosis in the neck, but Moderate intracranial stenoses: - distal Left vertebral artery. - distal 3rd Basilar Artery proximal to the SCA origins. - Right MCA posterior M2 branch origin. -neurology  consult appreciated -PT/OT eval-- CIR -on statin (LDL: 93) -recently in hospital in [facility] in February and [facility] in April for CVA like symptoms and CVAs  Dysphagia -DYS 2 diet  Sinus bradycardia with pauses -seen by EP-- no intervention than avoiding nodal blocking agents-- unless she becomes symptomatic -? Sleep apnea-- outpatient sleep study  Mediastinal lymphadenopathy - CT scan of chest: Nonspecific mildly enlarged mediastinal and BILATERAL hilar lymph nodes; these could be seen as a reactive process, metastatic disease, lymphoma, and Castleman disease.  The lack of nodal calcifications makes sarcoidosis less likely.  Moderate bronchitic changes with subsegmental atelectasis LEFT lower lobe.  Extensive coronary arterial calcifications and minimal pericardial effusion.  5 mm RIGHT upper lobe nodule, recommendation below.  No follow-up needed if patient is low-risk. Non-contrast chest CT can be considered in 12 months  Hypokalemia -replaced -replace Mg as well   IDDM  -recent HgbA1c was uncontrolled (12.2---> 8.4) -continue SSI   Hypertension: -resume home meds-- AVOID nodal blocking agents  tobacco abuse: -encouraged cessation  Weight loss- unintended -outpatient follow up      Medical Consultants:   Neuro EP CIR   Discharge Exam:   Vitals:   12/09/17 0814 12/09/17 1138  BP: (!) 172/97 (!) 167/104  Pulse: 92 86  Resp: 18 18  Temp: 98 F (36.7 C) 98.6 F (37 C)  SpO2: 98% 100%   Vitals:   12/09/17 0030 12/09/17 0322 12/09/17 0814 12/09/17 1138  BP: (!) 151/91 137/61 (!) 172/97 (!) 167/104  Pulse: 82 80 92 86  Resp: 20 16 18 18   Temp:  98.1 F (36.7 C) 97.8 F (36.6 C) 98 F (36.7 C) 98.6 F (37 C)  TempSrc: Oral Oral Oral Oral  SpO2: 99% 98% 98% 100%  Weight:      Height:        General exam: Appears calm and comfortable, continues right sided paralysis   The results of significant diagnostics from this  hospitalization (including imaging, microbiology, ancillary and laboratory) are listed below for reference.     Procedures and Diagnostic Studies:   Ct Angio Head W Or Wo Contrast  Result Date: 12/05/2017 CLINICAL DATA:  57 year old female with acute and subacute on chronic small vessel infarcts demonstrated on MRI today performed for dizziness and difficulty walking. EXAM: CT ANGIOGRAPHY HEAD AND NECK TECHNIQUE: Multidetector CT imaging of the head and neck was performed using the standard protocol during bolus administration of intravenous contrast. Multiplanar CT image reconstructions and MIPs were obtained to evaluate the vascular anatomy. Carotid stenosis measurements (when applicable) are obtained utilizing NASCET criteria, using the distal internal carotid diameter as the denominator. CONTRAST:  61mL ISOVUE-370 IOPAMIDOL (ISOVUE-370) INJECTION 76% COMPARISON:  Brain MRI 1659 hours today. FINDINGS: CTA NECK Skeleton: Occasional carious dentition. No acute osseous abnormality identified. Upper chest: Negative lung apices, but abnormally increased mediastinal lymph nodes, such as the anterior carina node on series 5, image 167. There may also be increased hilar nodes. Other neck: The glottis is closed.  No neck mass or lymphadenopathy. Aortic arch: 3 vessel arch configuration. Abundant soft plaque of the arch. Minimal calcified plaque probably just proximal to the brachiocephalic artery origin rather than ulcerated plaque (series 8, image 131). Right carotid system: No right CCA origin stenosis despite soft plaque. Minimal plaque at the right carotid bifurcation. No cervical right ICA stenosis, but the vessel is tortuous just below the skull base. Left carotid system: No stenosis despite soft plaque at the left CCA origin. Tortuous proximal left CCA. Soft plaque at the left ICA origin and bulb but no stenosis. Tortuous left ICA just below the skull base. Vertebral arteries: No proximal right subclavian  artery or right vertebral artery origin stenosis despite soft plaque. The right vertebral artery is dominant and tortuous in the neck but patent to the skull base without stenosis. Abundant proximal left subclavian soft plaques similar to that in the arch. Focal low-density plaque is evident on series 5, image 145. Still there is no hemodynamically significant left subclavian stenosis. Likewise, only mild left vertebral artery origin stenosis due to soft plaque. The left vertebral artery is non dominant and tortuous in the neck. No cervical left vertebral stenosis. CTA HEAD Posterior circulation: Mildly dominant distal right vertebral artery without stenosis. Dominant right AICA. Patent distal left vertebral artery with moderate stenosis affecting a long segment of the distal V4 just proximal to the vertebrobasilar junction (series 7, image 132). The left PICA origin is patent. Patent basilar artery with mild irregularity proximally, and mild to moderate circumferential stenosis distally proximal to the basilar tip (series 12, image 23). The SCA and PCA origins remain patent. There is mild irregularity throughout the right PCA, but no stenosis or occlusion. Lesser left PCA irregularity. Posterior communicating arteries are diminutive or absent. Anterior circulation: Both ICA siphons are patent. There is mild irregularity throughout both siphons with intermittent calcified plaque. No significant siphon stenosis. Normal ophthalmic artery origins. Patent carotid termini. Patent MCA and ACA origins. The proximal ACA is are within normal limits. There is mild distal ACA irregularity. The left MCA M1 segment is mildly  irregular and bifurcates early. No significant stenosis or left MCA branch occlusion identified. Mild left MCA branch irregularity. The right MCA is patent without stenosis. There is moderate irregularity and stenosis at the origin of the dominant right M2 posterior branch (series 11, image 15. Mild right  MCA branch irregularity otherwise. Venous sinuses: Patent on the delayed images. Anatomic variants: Mildly dominant right vertebral artery. Delayed phase: No abnormal enhancement identified. Stable gray-white matter differentiation throughout the brain. Review of the MIP images confirms the above findings IMPRESSION: 1. Negative for large vessel occlusion. 2. Positive for widespread atherosclerosis in the head, neck, and chest. Abundant soft plaque of the aortic arch and the proximal great vessels. 3. No hemodynamically significant stenosis in the neck, but Moderate intracranial stenoses: - distal Left vertebral artery. - distal 3rd Basilar Artery proximal to the SCA origins. - Right MCA posterior M2 branch origin. 4.  Stable CT appearance of the brain since 1250 hours today. 5. Nonspecific mediastinal lymphadenopathy is partially visible. RECOMMEND routine follow-up Chest CT (IV contrast preferred) further characterize. Electronically Signed   By: Genevie Ann M.D.   On: 12/05/2017 18:55   Ct Head Wo Contrast  Result Date: 12/05/2017 CLINICAL DATA:  Dizziness and difficulty walking beginning this morning. Normal last night. EXAM: CT HEAD WITHOUT CONTRAST TECHNIQUE: Contiguous axial images were obtained from the base of the skull through the vertex without intravenous contrast. COMPARISON:  CT and MRI 10/26/2017 FINDINGS: Brain: Generalized atrophy. Chronic small-vessel changes affecting the pons. Old small vessel infarctions affecting the thalami and basal ganglia. Chronic small-vessel ischemic changes of the white matter. No sign of acute infarction, mass lesion, hemorrhage, hydrocephalus or extra-axial collection. Vascular: There is atherosclerotic calcification of the major vessels at the base of the brain. Skull: Negative Sinuses/Orbits: Clear/normal Other: None IMPRESSION: No acute finding by CT. Extensive old ischemic changes throughout the brain as outlined above. Electronically Signed   By: Nelson Chimes M.D.    On: 12/05/2017 13:03   Ct Angio Neck W Or Wo Contrast  Result Date: 12/05/2017 CLINICAL DATA:  58 year old female with acute and subacute on chronic small vessel infarcts demonstrated on MRI today performed for dizziness and difficulty walking. EXAM: CT ANGIOGRAPHY HEAD AND NECK TECHNIQUE: Multidetector CT imaging of the head and neck was performed using the standard protocol during bolus administration of intravenous contrast. Multiplanar CT image reconstructions and MIPs were obtained to evaluate the vascular anatomy. Carotid stenosis measurements (when applicable) are obtained utilizing NASCET criteria, using the distal internal carotid diameter as the denominator. CONTRAST:  25mL ISOVUE-370 IOPAMIDOL (ISOVUE-370) INJECTION 76% COMPARISON:  Brain MRI 1659 hours today. FINDINGS: CTA NECK Skeleton: Occasional carious dentition. No acute osseous abnormality identified. Upper chest: Negative lung apices, but abnormally increased mediastinal lymph nodes, such as the anterior carina node on series 5, image 167. There may also be increased hilar nodes. Other neck: The glottis is closed.  No neck mass or lymphadenopathy. Aortic arch: 3 vessel arch configuration. Abundant soft plaque of the arch. Minimal calcified plaque probably just proximal to the brachiocephalic artery origin rather than ulcerated plaque (series 8, image 131). Right carotid system: No right CCA origin stenosis despite soft plaque. Minimal plaque at the right carotid bifurcation. No cervical right ICA stenosis, but the vessel is tortuous just below the skull base. Left carotid system: No stenosis despite soft plaque at the left CCA origin. Tortuous proximal left CCA. Soft plaque at the left ICA origin and bulb but no stenosis. Tortuous left ICA just  below the skull base. Vertebral arteries: No proximal right subclavian artery or right vertebral artery origin stenosis despite soft plaque. The right vertebral artery is dominant and tortuous in the  neck but patent to the skull base without stenosis. Abundant proximal left subclavian soft plaques similar to that in the arch. Focal low-density plaque is evident on series 5, image 145. Still there is no hemodynamically significant left subclavian stenosis. Likewise, only mild left vertebral artery origin stenosis due to soft plaque. The left vertebral artery is non dominant and tortuous in the neck. No cervical left vertebral stenosis. CTA HEAD Posterior circulation: Mildly dominant distal right vertebral artery without stenosis. Dominant right AICA. Patent distal left vertebral artery with moderate stenosis affecting a long segment of the distal V4 just proximal to the vertebrobasilar junction (series 7, image 132). The left PICA origin is patent. Patent basilar artery with mild irregularity proximally, and mild to moderate circumferential stenosis distally proximal to the basilar tip (series 12, image 23). The SCA and PCA origins remain patent. There is mild irregularity throughout the right PCA, but no stenosis or occlusion. Lesser left PCA irregularity. Posterior communicating arteries are diminutive or absent. Anterior circulation: Both ICA siphons are patent. There is mild irregularity throughout both siphons with intermittent calcified plaque. No significant siphon stenosis. Normal ophthalmic artery origins. Patent carotid termini. Patent MCA and ACA origins. The proximal ACA is are within normal limits. There is mild distal ACA irregularity. The left MCA M1 segment is mildly irregular and bifurcates early. No significant stenosis or left MCA branch occlusion identified. Mild left MCA branch irregularity. The right MCA is patent without stenosis. There is moderate irregularity and stenosis at the origin of the dominant right M2 posterior branch (series 11, image 15. Mild right MCA branch irregularity otherwise. Venous sinuses: Patent on the delayed images. Anatomic variants: Mildly dominant right vertebral  artery. Delayed phase: No abnormal enhancement identified. Stable gray-white matter differentiation throughout the brain. Review of the MIP images confirms the above findings IMPRESSION: 1. Negative for large vessel occlusion. 2. Positive for widespread atherosclerosis in the head, neck, and chest. Abundant soft plaque of the aortic arch and the proximal great vessels. 3. No hemodynamically significant stenosis in the neck, but Moderate intracranial stenoses: - distal Left vertebral artery. - distal 3rd Basilar Artery proximal to the SCA origins. - Right MCA posterior M2 branch origin. 4.  Stable CT appearance of the brain since 1250 hours today. 5. Nonspecific mediastinal lymphadenopathy is partially visible. RECOMMEND routine follow-up Chest CT (IV contrast preferred) further characterize. Electronically Signed   By: Genevie Ann M.D.   On: 12/05/2017 18:55   Mr Brain Wo Contrast  Result Date: 12/05/2017 CLINICAL DATA:  57 year old female with dizziness, difficulty walking beginning this morning. EXAM: MRI HEAD WITHOUT CONTRAST TECHNIQUE: Multiplanar, multiecho pulse sequences of the brain and surrounding structures were obtained without intravenous contrast. COMPARISON:  Head CT without contrast 1250 hours today. Brain MRI 10/26/2017, and earlier. FINDINGS: Brain: There are 2 small areas of abnormal diffusion in the left frontal lobe. A nodular focus of restriction in the left periventricular white matter along the anterior left frontal horn is noted on series 501, image 58 (ADC map image 22). A patchy curvilinear area in the left corona radiata appears only partially restricted (image 57). Both there is demonstrate T2 and FLAIR hyperintensity. No associated hemorrhage or mass effect. No other No restricted diffusion or evidence of acute infarction. Abnormal T2 and FLAIR signal in the pons, throughout the  bilateral deep gray matter nuclei, and cerebral white matter most resembling advanced chronic small vessel  ischemia. Focal involvement of the deep white matter capsules, and also the anterior body of the corpus callosum which is abnormally thinned (series 7001, image 12), and mild chronic blood products also noted at this site (series 11001, image 40). Chronic hemorrhage also in the left deep gray matter nuclei and lower brainstem (image 15). The cerebellar hemispheres appear spared. No cerebral cortical encephalomalacia is identified. No midline shift, mass effect, evidence of mass lesion, ventriculomegaly, extra-axial collection or acute intracranial hemorrhage. Cervicomedullary junction and pituitary are within normal limits. Vascular: Major intracranial vascular flow voids are stable. Skull and upper cervical spine: Negative visible cervical spine. Visualized bone marrow signal is within normal limits. Sinuses/Orbits: Stable and negative. Other: Mastoid air cells remain clear. Grossly normal visible internal auditory structures. Negative scalp and face soft tissues. IMPRESSION: 1. Small acute lacunar infarct along the left frontal horn periventricular white matter. Small subacute lacunar infarct in the left posterior corona radiata. No acute hemorrhage or mass effect. 2. Underlying very advanced chronic small vessel disease redemonstrated. Numerous chronic lacunar infarcts in the deep gray matter nuclei, brainstem, and cerebral white matter including the corpus callosum. Electronically Signed   By: Genevie Ann M.D.   On: 12/05/2017 17:34     Labs:   Basic Metabolic Panel: Recent Labs  Lab 12/06/17 0459 12/06/17 1818 12/07/17 0717 12/08/17 0545 12/09/17 0413  NA 141 141 141 141 140  K 2.8* 3.2* 3.1* 3.4* 3.6  CL 104 106 106 107 107  CO2 25 22 22 24 24   GLUCOSE 109* 79 86 99 110*  BUN 15 11 11 11 14   CREATININE 0.83 0.80 0.80 0.76 0.81  CALCIUM 9.1 9.2 9.2 8.9 9.3  MG  --  1.7 1.7  --   --    GFR Estimated Creatinine Clearance: 72.1 mL/min (by C-G formula based on SCr of 0.81 mg/dL). Liver  Function Tests: Recent Labs  Lab 12/05/17 1244 12/06/17 0459  AST 18 16  ALT 10* 9*  ALKPHOS 72 65  BILITOT 0.4 0.4  PROT 7.6 7.2  ALBUMIN 3.8 3.3*   No results for input(s): LIPASE, AMYLASE in the last 168 hours. No results for input(s): AMMONIA in the last 168 hours. Coagulation profile Recent Labs  Lab 12/05/17 1244  INR 1.04    CBC: Recent Labs  Lab 12/05/17 1244 12/05/17 1251 12/05/17 2226 12/07/17 0717 12/08/17 0545 12/09/17 0413  WBC 5.4  --  5.6 4.6 4.7 4.7  NEUTROABS 2.6  --   --   --   --   --   HGB 12.4 12.9 11.9* 12.5 12.5 12.4  HCT 36.9 38.0 35.1* 36.9 37.6 36.5  MCV 83.3  --  82.0 82.6 83.2 82.2  PLT 393  --  364 388 350 380   Cardiac Enzymes: No results for input(s): CKTOTAL, CKMB, CKMBINDEX, TROPONINI in the last 168 hours. BNP: Invalid input(s): POCBNP CBG: Recent Labs  Lab 12/08/17 0606 12/08/17 1156 12/08/17 1658 12/08/17 2140 12/09/17 0614  GLUCAP 92 102* 73 108* 96   D-Dimer No results for input(s): DDIMER in the last 72 hours. Hgb A1c No results for input(s): HGBA1C in the last 72 hours. Lipid Profile No results for input(s): CHOL, HDL, LDLCALC, TRIG, CHOLHDL, LDLDIRECT in the last 72 hours. Thyroid function studies No results for input(s): TSH, T4TOTAL, T3FREE, THYROIDAB in the last 72 hours.  Invalid input(s): FREET3 Anemia work up No results for  input(s): VITAMINB12, FOLATE, FERRITIN, TIBC, IRON, RETICCTPCT in the last 72 hours. Microbiology No results found for this or any previous visit (from the past 240 hour(s)).   Discharge Instructions:   Discharge Instructions    Discharge instructions   Complete by:  As directed    Will need outpatient sleep study Monitor blood sugars closely Cbc, BMP 1 week DYS 2 diet     Allergies as of 12/09/2017      Reactions   Niacin And Related Rash      Medication List    STOP taking these medications   ADVIL PM PO   BLOOD GLUCOSE TEST STRIPS Strp   glyBURIDE 2.5 MG  tablet Commonly known as:  DIABETA   hydrochlorothiazide 25 MG tablet Commonly known as:  HYDRODIURIL   Insulin Glargine 300 UNIT/ML Sopn Commonly known as:  TOUJEO SOLOSTAR   Insulin Pen Needle 32G X 6 MM Misc Commonly known as:  BD PEN NEEDLE MICRO U/F   Lancet Devices Misc     TAKE these medications   albuterol (2.5 MG/3ML) 0.083% nebulizer solution Commonly known as:  PROVENTIL Take 3 mLs (2.5 mg total) by nebulization every 6 (six) hours as needed for wheezing or shortness of breath.   albuterol 108 (90 Base) MCG/ACT inhaler Commonly known as:  PROVENTIL HFA;VENTOLIN HFA Inhale 2 puffs into the lungs every 6 (six) hours as needed for wheezing or shortness of breath.   amLODipine 5 MG tablet Commonly known as:  NORVASC Take 1 tablet (5 mg total) by mouth daily.   aspirin 325 MG EC tablet Take 1 tablet (325 mg total) by mouth daily. Start taking on:  12/10/2017 What changed:    medication strength  how much to take   insulin aspart 100 UNIT/ML injection Commonly known as:  novoLOG Inject 0-9 Units into the skin 3 (three) times daily with meals.   lisinopril 40 MG tablet Commonly known as:  PRINIVIL,ZESTRIL Take 1 tablet (40 mg total) by mouth daily.   mouth rinse Liqd solution 15 mLs by Mouth Rinse route 2 (two) times daily.   nicotine 21 mg/24hr patch Commonly known as:  NICODERM CQ - dosed in mg/24 hours Place 1 patch (21 mg total) onto the skin daily. Start taking on:  12/10/2017   potassium chloride SA 20 MEQ tablet Commonly known as:  K-DUR,KLOR-CON Take 1 tablet (20 mEq total) by mouth daily.   rosuvastatin 10 MG tablet Commonly known as:  CRESTOR Take 1 tablet (10 mg total) by mouth daily.   senna-docusate 8.6-50 MG tablet Commonly known as:  Senokot-S Take 1 tablet by mouth at bedtime as needed for moderate constipation.   umeclidinium bromide 62.5 MCG/INH Aepb Commonly known as:  INCRUSE ELLIPTA Inhale 1 puff into the lungs daily.          Time coordinating discharge: 35 min  Signed:  Geradine Girt  [facility] 12/09/2017, 11:41 AM

## 2017-12-09 NOTE — H&P (Signed)
Physical Medicine and Rehabilitation Admission H&P        Chief Complaint  Patient presents with  . Weakness  : HPI: Kelly Gibson is a 57 year old right-handed female with history of diabetes mellitus, COPD/tobacco abuse, hypertension, hyperlipidemia as well as recent CVA in March with mild right-sided residual weakness maintained on aspirin 81 mg daily as well as loop recorder.  Per chart review she lives with her spouse.  Two-level home with bed and bath upstairs.  She was independent driving performing all ADLs prior to admission.  Her husband works during the day they have 3 daughters in the area.  Presented 12/05/2017 with increasing right-sided weakness slurred speech and dizziness as well as reported weight loss.  Loop recorder did show 3 to 4-second pauses initial heart rate in the 30s.  MRI of the brain showed small acute lacunar infarction along the left frontal horn periventricular white matter.  Small subacute lacunar infarction in the left posterior corona radiata.  Numerous chronic lacunar infarcts in the deep gray matter nucllei, brainstem and cerebral white matter including the corpus callosum.  CT angiogram of the head neck negative for large vessel occlusion.  Positive for widespread atherosclerosis.  Echocardiogram with ejection fraction of 60% no wall motion abnormalities with grade 1 diastolic dysfunction.  Neurology follow-up aspirin 325 mg daily for CVA prophylaxis.  Subcutaneous Lovenox for DVT prophylaxis.  Dysphasia #2 thin liquid diet.  CT of the chest for evaluation of weight loss that showed nonspecific mildly enlarged mediastinal bilateral hilar lymph nodes as well as 5 mm right upper lobe nodule with recommendations for noncontrast chest CT 12 months...  Physical and occupational therapy evaluations completed with recommendations of physical medicine rehab consult.   Review of Systems  Constitutional: Negative for chills and fever.  HENT: Negative for hearing loss.    Eyes: Negative for blurred vision and double vision.  Respiratory: Positive for shortness of breath. Negative for cough.   Cardiovascular: Positive for palpitations and leg swelling.  Gastrointestinal: Positive for constipation. Negative for nausea and vomiting.  Genitourinary: Negative for dysuria.  Musculoskeletal: Positive for myalgias.  Skin: Negative for rash.  Neurological: Positive for speech change and focal weakness.  Psychiatric/Behavioral: Positive for depression.  All other systems reviewed and are negative.       Past Medical History:  Diagnosis Date  . COPD (chronic obstructive pulmonary disease) ([place])    . Depression    . Diabetes mellitus    . Hyperlipidemia    . Hypertension           Past Surgical History:  Procedure Laterality Date  . CESAREAN SECTION        x 3  . TUMOR REMOVAL        left shoulder         Family History  Problem Relation Age of Onset  . Diabetes Mother    . Heart disease Father    . Cancer Father      Social History:  reports that she has quit smoking. She has never used smokeless tobacco. She reports that she does not drink alcohol or use drugs. Allergies:      Allergies  Allergen Reactions  . Niacin And Related Rash          Medications Prior to Admission  Medication Sig Dispense Refill  . albuterol (PROVENTIL HFA;VENTOLIN HFA) 108 (90 Base) MCG/ACT inhaler Inhale 2 puffs into the lungs every 6 (six) hours as needed for wheezing or shortness of breath. 1  Inhaler 1  . albuterol (PROVENTIL) (2.5 MG/3ML) 0.083% nebulizer solution Take 3 mLs (2.5 mg total) by nebulization every 6 (six) hours as needed for wheezing or shortness of breath. 30 vial 0  . amLODipine (NORVASC) 5 MG tablet Take 1 tablet (5 mg total) by mouth daily. 30 tablet 0  . aspirin EC 81 MG tablet Take 81 mg by mouth daily.      Marland Kitchen glyBURIDE (DIABETA) 2.5 MG tablet One tab daily 30 tablet 6  . hydrochlorothiazide (HYDRODIURIL) 25 MG tablet Take 1 tablet (25 mg  total) by mouth daily. 90 tablet 1  . Ibuprofen-diphenhydrAMINE Cit (ADVIL PM PO) Take 1 tablet by mouth as needed (for sleep).      . Insulin Glargine (TOUJEO SOLOSTAR) 300 UNIT/ML SOPN Inject 10 Units into the skin at bedtime. 3 pen 6  . Insulin Pen Needle (BD PEN NEEDLE MICRO U/F) 32G X 6 MM MISC Use as directed to inject Toujeo SQ QD. (Patient taking differently: No sig reported) 100 each 3  . lisinopril (PRINIVIL,ZESTRIL) 40 MG tablet Take 1 tablet (40 mg total) by mouth daily. 30 tablet 0  . rosuvastatin (CRESTOR) 10 MG tablet Take 1 tablet (10 mg total) by mouth daily. 90 tablet 1  . umeclidinium bromide (INCRUSE ELLIPTA) 62.5 MCG/INH AEPB Inhale 1 puff into the lungs daily. 30 each 6  . Glucose Blood (BLOOD GLUCOSE TEST STRIPS) STRP Please dispense per patient and insurance preference. Use as directed to monitor FSBS  3x daily. Dx: E11.65. 100 each 11  . Lancet Devices MISC Please dispense per patient and insurance preference. Use as directed to monitor FSBS  3x daily. Dx: E11.65. 100 each 11  . potassium chloride SA (K-DUR,KLOR-CON) 20 MEQ tablet Take 1 tablet (20 mEq total) by mouth daily. (Patient not taking: Reported on 11/03/2017) 90 tablet 1      Drug Regimen Review Drug regimen was reviewed and remains appropriate with no significant issues identified   Home: Home Living Family/patient expects to be discharged to:: Private residence Living Arrangements: Spouse/significant other Available Help at Discharge: Family Type of Home: House Home Access: Level entry Home Layout: Two level, Bed/bath upstairs Alternate Level Stairs-Number of Steps: 2 flights with a landing  Bathroom Shower/Tub: Chiropodist: Standard Home Equipment: None Additional Comments: pt's daughter reports family is looking into arranging for 24hr supervision for the summer   Lives With: Spouse   Functional History: Prior Function Level of Independence: Independent Comments: spouse  assisting with getting into/out of car   Functional Status:  Mobility: Bed Mobility Overal bed mobility: Needs Assistance Bed Mobility: Supine to Sit Supine to sit: +2 for safety/equipment, Max assist General bed mobility comments: assist to advance RLE and scoot hips towards EOB; assist to bring trunk upright into sitting  Transfers Overall transfer level: Needs assistance Equipment used: 2 person hand held assist Transfer via Lift Equipment: Stedy Transfers: Sit to/from Stand, [employer] Transfers Sit to Stand: Mod assist, +2 safety/equipment Stand pivot transfers: Mod assist, +2 physical assistance, +2 safety/equipment General transfer comment: Pt completing stand pivot transfer EOB>BSC to the L with ModA+2, R knee blocked for increased stability with pt able to pivot on L foot to assist with transition; completed additional sit<>stand from [facility] using bedrail for LUE support    ADL: ADL Overall ADL's : Needs assistance/impaired Eating/Feeding: Minimal assistance Grooming: Minimal assistance, Sitting Upper Body Bathing: Sitting, Minimal assistance Upper Body Bathing Details (indicate cue type and reason): pt able to wash chest,  abdomen, and RUE; assist to wash LUE  Lower Body Bathing: Moderate assistance, +2 for physical assistance, +2 for safety/equipment, Sit to/from stand Lower Body Bathing Details (indicate cue type and reason): assist to wash lower portion of LEs while seated; pt maintaining static standing with modA using LUE support on bedrail (BSC and pt positioned to face bed rail) while additional therapist washes buttocks  Upper Body Dressing : Sitting, Minimal assistance Upper Body Dressing Details (indicate cue type and reason): donning new gown  Lower Body Dressing: Moderate assistance, Sit to/from stand, +2 for physical assistance, +2 for safety/equipment Toilet Transfer: Moderate assistance, +2 for physical assistance, +2 for safety/equipment, Stand-pivot,  BSC Toileting- Clothing Manipulation and Hygiene: Moderate assistance, +2 for physical assistance, +2 for safety/equipment, Sit to/from stand Functional mobility during ADLs: Moderate assistance, +2 for physical assistance, +2 for safety/equipment(for stand pivot transfer) General ADL Comments: pt noted to have soiled gown and bed linens due to pure wick not working; transferred to [facility] to wash up and don new gown prior to transferring to recliner    Cognition: Cognition Overall Cognitive Status: Impaired/Different from baseline Arousal/Alertness: Awake/alert Orientation Level: Oriented X4 Attention: Selective Selective Attention: Impaired Selective Attention Impairment: Verbal basic, Functional basic Memory: Impaired Memory Impairment: Storage deficit, Retrieval deficit, Decreased recall of new information Awareness: Impaired Awareness Impairment: Emergent impairment, Intellectual impairment Problem Solving: Impaired Problem Solving Impairment: Verbal basic, Functional basic Cognition Arousal/Alertness: Awake/alert Behavior During Therapy: WFL for tasks assessed/performed Overall Cognitive Status: Impaired/Different from baseline Area of Impairment: Problem solving Problem Solving: Slow processing General Comments: slower processing noted and with expressive difficulties    Physical Exam: Blood pressure (!) 153/87, pulse 86, temperature 98.2 F (36.8 C), temperature source Oral, resp. rate 18, height 5' 2"  (1.575 m), weight 70.3 kg (155 lb), last menstrual period 12/10/2012, SpO2 100 %. Physical Exam  Vitals reviewed. HENT:  Right facial droop  Eyes: EOM are normal.  Neck: Normal range of motion. Neck supple. No thyromegaly present.  Cardiovascular: Regular rhythm.  Respiratory: Effort normal and breath sounds normal. No respiratory distress.  GI: Soft. Bowel sounds are normal. She exhibits no distension.  Skin.  Warm and dry Neurological.  Speech is dysarthric but  intelligible.  She follows simple commands.  Dense right sided weakness.  Motor strength is trace at the right pectoralis 0 at the biceps triceps finger flexors extensors hip flexor knee extensor ankle dorsiflexor and plantar flexor 5/5 at the left deltoid, bicep, tricep, grip, hip flexor, knee extensor, ankle dorsiflexor and plantar flexor Sensation diminished to light touch she is inconsistent naming which fingers touched in the right upper and right lower limb. Tone is reduced in the right upper extremity Developing increased deep tendon reflexes in the right lower extremity no evidence of clonus.     Lab Results Last 48 Hours  Results for orders placed or performed during the hospital encounter of 12/05/17 (from the past 48 hour(s))  Glucose, capillary     Status: None    Collection Time: 12/06/17 11:37 AM  Result Value Ref Range    Glucose-Capillary 97 65 - 99 mg/dL    Comment 1 Notify RN      Comment 2 Document in Chart    Glucose, capillary     Status: None    Collection Time: 12/06/17  4:58 PM  Result Value Ref Range    Glucose-Capillary 79 65 - 99 mg/dL    Comment 1 Notify RN      Comment 2 Document in  Chart    Magnesium     Status: None    Collection Time: 12/06/17  6:18 PM  Result Value Ref Range    Magnesium 1.7 1.7 - 2.4 mg/dL      Comment: Performed at [facility], [place] [address] [place], [place] [address]  Basic metabolic panel     Status: Abnormal    Collection Time: 12/06/17  6:18 PM  Result Value Ref Range    Sodium 141 135 - 145 mmol/L    Potassium 3.2 (L) 3.5 - 5.1 mmol/L    Chloride 106 101 - 111 mmol/L    CO2 22 22 - 32 mmol/L    Glucose, Bld 79 65 - 99 mg/dL    BUN 11 6 - 20 mg/dL    Creatinine, Ser 0.80 0.44 - 1.00 mg/dL    Calcium 9.2 8.9 - 10.3 mg/dL    GFR calc non Af Amer >60 >60 mL/min    GFR calc Af Amer >60 >60 mL/min      Comment: (NOTE) The eGFR has been calculated using the CKD EPI equation. This calculation has not been validated in  all clinical situations. eGFR's persistently <60 mL/min signify possible Chronic Kidney Disease.      Anion gap 13 5 - 15      Comment: Performed at [facility] [address], [place], [place] [address]  Glucose, capillary     Status: None    Collection Time: 12/06/17  9:45 PM  Result Value Ref Range    Glucose-Capillary 75 65 - 99 mg/dL  Glucose, capillary     Status: None    Collection Time: 12/06/17  9:53 PM  Result Value Ref Range    Glucose-Capillary 69 65 - 99 mg/dL  Glucose, capillary     Status: None    Collection Time: 12/07/17  6:08 AM  Result Value Ref Range    Glucose-Capillary 75 65 - 99 mg/dL    Comment 1 Notify RN      Comment 2 Document in Chart    CBC     Status: None    Collection Time: 12/07/17  7:17 AM  Result Value Ref Range    WBC 4.6 4.0 - 10.5 K/uL    RBC 4.47 3.87 - 5.11 MIL/uL    Hemoglobin 12.5 12.0 - 15.0 g/dL    HCT 36.9 36.0 - 46.0 %    MCV 82.6 78.0 - 100.0 fL    MCH 28.0 26.0 - 34.0 pg    MCHC 33.9 30.0 - 36.0 g/dL    RDW 13.6 11.5 - 15.5 %    Platelets 388 150 - 400 K/uL      Comment: Performed at [facility], [place] [address], [place], [place] [address]  Basic metabolic panel     Status: Abnormal    Collection Time: 12/07/17  7:17 AM  Result Value Ref Range    Sodium 141 135 - 145 mmol/L    Potassium 3.1 (L) 3.5 - 5.1 mmol/L    Chloride 106 101 - 111 mmol/L    CO2 22 22 - 32 mmol/L    Glucose, Bld 86 65 - 99 mg/dL    BUN 11 6 - 20 mg/dL    Creatinine, Ser 0.80 0.44 - 1.00 mg/dL    Calcium 9.2 8.9 - 10.3 mg/dL    GFR calc non Af Amer >60 >60 mL/min    GFR calc Af Amer >60 >60 mL/min  Comment: (NOTE) The eGFR has been calculated using the CKD EPI equation. This calculation has not been validated in all clinical situations. eGFR's persistently <60 mL/min signify possible Chronic Kidney Disease.      Anion gap 13 5 - 15      Comment: Performed at [facility] [address], [place], [place] [address]   Magnesium     Status: None    Collection Time: 12/07/17  7:17 AM  Result Value Ref Range    Magnesium 1.7 1.7 - 2.4 mg/dL      Comment: Performed at [facility] [address], [place], [place] [address]  Glucose, capillary     Status: None    Collection Time: 12/07/17 12:17 PM  Result Value Ref Range    Glucose-Capillary 66 65 - 99 mg/dL    Comment 1 Notify RN      Comment 2 Document in Chart    Glucose, capillary     Status: Abnormal    Collection Time: 12/07/17  4:36 PM  Result Value Ref Range    Glucose-Capillary 100 (H) 65 - 99 mg/dL    Comment 1 Notify RN      Comment 2 Document in Chart    Glucose, capillary     Status: None    Collection Time: 12/08/17 12:33 AM  Result Value Ref Range    Glucose-Capillary 90 65 - 99 mg/dL    Comment 1 Notify RN      Comment 2 Document in Chart    CBC     Status: None    Collection Time: 12/08/17  5:45 AM  Result Value Ref Range    WBC 4.7 4.0 - 10.5 K/uL    RBC 4.52 3.87 - 5.11 MIL/uL    Hemoglobin 12.5 12.0 - 15.0 g/dL    HCT 37.6 36.0 - 46.0 %    MCV 83.2 78.0 - 100.0 fL    MCH 27.7 26.0 - 34.0 pg    MCHC 33.2 30.0 - 36.0 g/dL    RDW 13.2 11.5 - 15.5 %    Platelets 350 150 - 400 K/uL      Comment: Performed at [facility], [place] [address], [place], [place] [address]  Basic metabolic panel     Status: Abnormal    Collection Time: 12/08/17  5:45 AM  Result Value Ref Range    Sodium 141 135 - 145 mmol/L    Potassium 3.4 (L) 3.5 - 5.1 mmol/L    Chloride 107 101 - 111 mmol/L    CO2 24 22 - 32 mmol/L    Glucose, Bld 99 65 - 99 mg/dL    BUN 11 6 - 20 mg/dL    Creatinine, Ser 0.76 0.44 - 1.00 mg/dL    Calcium 8.9 8.9 - 10.3 mg/dL    GFR calc non Af Amer >60 >60 mL/min    GFR calc Af Amer >60 >60 mL/min      Comment: (NOTE) The eGFR has been calculated using the CKD EPI equation. This calculation has not been validated in all clinical situations. eGFR's persistently <60 mL/min signify possible Chronic  Kidney Disease.      Anion gap 10 5 - 15      Comment: Performed at [facility] [address], [place], [place] [address]  Glucose, capillary     Status: None    Collection Time: 12/08/17  6:06 AM  Result Value Ref Range    Glucose-Capillary  92 65 - 99 mg/dL    Comment 1 Notify RN      Comment 2 Document in Chart         Imaging Results (Last 48 hours)  Ct Head Wo Contrast   Result Date: 12/07/2017 CLINICAL DATA:  Stroke follow-up EXAM: CT HEAD WITHOUT CONTRAST TECHNIQUE: Contiguous axial images were obtained from the base of the skull through the vertex without intravenous contrast. COMPARISON:  12/05/2017 FINDINGS: Brain: Known acute infarct in the left basal ganglia and left genu of the corpus callosum, the latter very subtle by CT. There is a background of extensive chronic small vessel ischemic injury in the deep gray nuclei and cerebral white matter. Atrophy greatest in the frontal lobes. No hemorrhage, hydrocephalus, or evidence of interval infarct. Vascular: Atherosclerotic calcification.  No hyperdense vessel Skull: No acute or aggressive finding Sinuses/Orbits: Negative IMPRESSION: 1. Known acute small-vessel infarcts. No hemorrhage or detectable progression when compared to brain MRI yesterday. 2. Advanced chronic small vessel disease. Electronically Signed   By: Monte Fantasia M.D.   On: 12/07/2017 10:15             Medical Problem List and Plan: 1.  Right hemiplegia with dysarthria and dysphagia secondary to left periventricular white matter left corona radiata infarctions as well as history of left brain infarction March 2019 2.  DVT Prophylaxis/Anticoagulation: Subcutaneous Lovenox.  Monitor for any bleeding episodes 3. Pain Management: Tylenol as needed 4. Mood: Provide emotional support 5. Neuropsych: This patient is capable of making decisions on her own behalf. 6. Skin/Wound Care: Routine skin checks 7. Fluids/Electrolytes/Nutrition: Routine in and outs with  follow-up chemistries 8.  Dysphagia.  Dysphagia #2 thin liquids.  Follow-up speech therapy 9.  Hypertension.  Norvasc 5 mg daily, lisinopril 40 mg daily 10.  Diabetes mellitus with peripheral neuropathy.  Hemoglobin A1c 8.4.  Sliding scale insulin.  Check blood sugars before meals and at bedtime.  Patient on DiaBeta 2.5 mg daily, insulin glargine 10 units nightly.  Resume as needed 11.  Hyperlipidemia.  Crestor 12.  COPD with tobacco abuse.  NicoDerm patch.  Provide counseling.  Inhalers as directed. 13.  5 mm right upper lobe nodule with nonspecific mildly enlarged mediastinal bilateral hilar lymph nodes per CT of the chest 12/08/2017.  Recommendations are for noncontrast chest CT 12 months for evaluation    Post Admission Physician Evaluation: 1. Functional deficits secondary  to left subcortical infarct causing right hemiplegia, dysarthria, and dysphagia. 2. Patient admitted to receive collaborative, interdisciplinary care between the physiatrist, rehab nursing staff, and therapy team. 3. Patient's level of medical complexity and substantial therapy needs in context of that medical necessity cannot be provided at a lesser intensity of care. 4. Patient has experienced substantial functional loss from his/her baseline. Upon functional assessment at the time of the preadmission screening, patient was Mod assist of 2 for mobility, mod assist ADLs lower body.  Upon most recent functional evaluation, patient was mod assist x1 for transfer using steadi.  Judging by the patient's diagnosis, physical exam, and functional history, the patient has potential for functional progress which will result in measurable gains while on inpatient rehab.  These gains will be of substantial and practical use upon discharge in facilitating mobility and self-care at the household level. 5. Physiatrist will provide 24 hour management of medical needs as well as oversight of the therapy plan/treatment and provide guidance as  appropriate regarding the interaction of the two. 6. 24 hour rehab nursing will assist in  the management of  bladder management, bowel management, safety, skin/wound care, disease management, medication administration, pain management and patient education  and help integrate therapy concepts, techniques,education, etc. 7. PT will assess and treat for:  Marland Kitchen  Goals are: contact guard. 8. OT will assess and treat for  .  Goals are: contact guard.  9. SLP will assess and treat for  .  Goals are: supervision. 10. Case Management and Social Worker will assess and treat for psychological issues and discharge planning. 11. Team conference will be held weekly to assess progress toward goals and to determine barriers to discharge. 12.  Patient will receive at least 3 hours of therapy per day at least 5 days per week. 13. ELOS and Prognosis: 18 to 21 days excellent   "I have personally performed a face to face diagnostic evaluation of this patient.  Additionally, I have reviewed and concur with the physician assistant's documentation above."  Charlett Blake M.D. [facility] FAAPM&R (Sports Med, Neuromuscular Med) Diplomate Am Board of Electrodiagnostic Med      Elizabeth Sauer 12/08/2017

## 2017-12-09 NOTE — PMR Pre-admission (Signed)
PMR Admission Coordinator Pre-Admission Assessment  Patient: Kelly Gibson is an 57 y.o., female MRN: [id number] DOB: 03/04/1961 Height: 5\' 2"  (157.5 cm) Weight: 70.3 kg (155 lb)              Insurance Information HMO:    PPO:       PCP:       IPA:       80/20:       OTHER:   PRIMARY:  [employer] [place] w/[facility]      Policy#: [id number]      Subscriber: Clement Husbands CM Name:        Phone#:       Fax#: [phone] Pre-Cert#: [id number]      Employer:  [employer] spouse works Benefits:  Phone #:  [phone]     Name: Phone call - [employer]. Date: 07/27/17     Deduct:  $0      Out of Pocket Max:  None      Life Max:  N/A CIR: Not covered      SNF:  Not covered Outpatient:       Co-Pay:   Home Health:        Co-Pay:   DME:       Co-Pay:   Providers:  Plan is for preventive services only.  Not inpatient rehab coverage.  Medicaid Application Date:      Case Manager:   Disability Application Date:      Case Worker:   Emergency Contact Information Contact Information    Name Relation Home Work Mobile   Janusz,[name] Spouse [phone]  [phone]     Current Medical History  Patient Admitting Diagnosis:  Left periventricular white matter left corona radiata infarcts causing right hemiparesis and dysarthria as well as dysphagia  History of Present Illness: a 57 year old right-handed female with history of diabetes mellitus, COPD/tobacco abuse, hypertension, hyperlipidemia as well as recent CVA in March with mild right-sided residual weakness maintained on aspirin 81 mg daily as well as loop recorder.  Per chart review she lives with her spouse.  Two-level home with bed and bath upstairs.  She was independent driving performing all ADLs prior to admission.  Her husband works during the day they have 3 daughters in the area. Presented 12/05/2017 with increasing right-sided weakness slurred speech and dizziness as well as reported weight loss. Loop recorder did show 3 to 4-second  pauses initial heart rate in the 30s.  MRI of the brain showed small acute lacunar infarction along the left frontal horn periventricular white matter.  Small subacute lacunar infarction in the left posterior corona radiata. Numerous chronic lacunar infarcts in the deep gray matter nucllei, brainstem and cerebral white matter including the corpus callosum.  CT angiogram of the head neck negative for large vessel occlusion.  Positive for widespread atherosclerosis.  Echocardiogram with ejection fraction of 60% no wall motion abnormalities with grade 1 diastolic dysfunction.  Neurology follow-up aspirin 325 mg daily for CVA prophylaxis.  Subcutaneous Lovenox for DVT prophylaxis.  Dysphasia #2 thin liquid diet.  CT of the chest for evaluation of weight loss that showed nonspecific mildly enlarged mediastinal bilateral hilar lymph nodes as well as 5 mm right upper lobe nodule with recommendations for noncontrast chest CT 12 months...  Physical and occupational therapy evaluations completed with recommendations of physical medicine rehab consult.   Total: 12=NIH  Past Medical History  Past Medical History:  Diagnosis Date  . COPD (chronic obstructive pulmonary disease) ([place])   .  Depression   . Diabetes mellitus   . Hyperlipidemia   . Hypertension     Family History  family history includes Cancer in her father; Diabetes in her mother; Heart disease in her father.  Prior Rehab/Hospitalizations: No previous rehab admissions  Has the patient had major surgery during 100 days prior to admission? No  Current Medications   Current Facility-Administered Medications:  .  acetaminophen (TYLENOL) tablet 650 mg, 650 mg, Oral, Q4H PRN, 650 mg at 12/08/17 1922 **OR** acetaminophen (TYLENOL) solution 650 mg, 650 mg, Per Tube, Q4H PRN **OR** acetaminophen (TYLENOL) suppository 650 mg, 650 mg, Rectal, Q4H PRN, Garba, Mohammad L, MD .  albuterol (PROVENTIL) (2.5 MG/3ML) 0.083% nebulizer solution 2.5 mg, 2.5 mg,  Nebulization, Q6H PRN, Jonelle Sidle, Mohammad L, MD .  amLODipine (NORVASC) tablet 5 mg, 5 mg, Oral, Daily, Vann, Jessica U, DO, 5 mg at 12/09/17 1040 .  aspirin EC tablet 325 mg, 325 mg, Oral, Daily, Kerney Elbe, MD, 325 mg at 12/09/17 1040 .  enoxaparin (LOVENOX) injection 40 mg, 40 mg, Subcutaneous, Q24H, Garba, Mohammad L, MD, 40 mg at 12/08/17 2146 .  hydrALAZINE (APRESOLINE) injection 5 mg, 5 mg, Intravenous, Q8H PRN, Vann, Jessica U, DO .  insulin aspart (novoLOG) injection 0-9 Units, 0-9 Units, Subcutaneous, TID WC, Elwyn Reach, MD, Stopped at 12/07/17 1200 .  lisinopril (PRINIVIL,ZESTRIL) tablet 40 mg, 40 mg, Oral, Daily, Vann, Jessica U, DO, 40 mg at 12/09/17 1040 .  MEDLINE mouth rinse, 15 mL, Mouth Rinse, BID, Vann, Jessica U, DO, 15 mL at 12/08/17 1111 .  nicotine (NICODERM CQ - dosed in mg/24 hours) patch 21 mg, 21 mg, Transdermal, Daily, Garba, Mohammad L, MD .  rosuvastatin (CRESTOR) tablet 40 mg, 40 mg, Oral, q1800, Kerney Elbe, MD, 40 mg at 12/08/17 1727 .  senna-docusate (Senokot-S) tablet 1 tablet, 1 tablet, Oral, QHS PRN, Garba, Mohammad L, MD .  umeclidinium bromide (INCRUSE ELLIPTA) 62.5 MCG/INH 1 puff, 1 puff, Inhalation, Daily, Gala Romney L, MD, 1 puff at 12/09/17 1145  Patients Current Diet:  Diet Order           DIET DYS 2 Room service appropriate? Yes; Fluid consistency: Thin  Diet effective now          Precautions / Restrictions Precautions Precautions: Fall Precaution Comments: bradycardia Restrictions Weight Bearing Restrictions: No   Has the patient had 2 or more falls or a fall with injury in the past year?Yes.  Patient reports 4 falls with no injury.  Prior Activity Level Community (5-7x/wk): Went out daily, was not working, was driving.  Home Assistive Devices / Equipment Home Assistive Devices/Equipment: Eyeglasses, CBG Meter Home Equipment: None  Prior Device Use: Indicate devices/aids used by the patient prior to current illness,  exacerbation or injury? None  Prior Functional Level Prior Function Level of Independence: Independent Comments: spouse assisting with getting into/out of car  Self Care: Did the patient need help bathing, dressing, using the toilet or eating?  Independent  Indoor Mobility: Did the patient need assistance with walking from room to room (with or without device)? Independent  Stairs: Did the patient need assistance with internal or external stairs (with or without device)? Independent  Functional Cognition: Did the patient need help planning regular tasks such as shopping or remembering to take medications? Independent  Current Functional Level Cognition  Arousal/Alertness: Awake/alert Overall Cognitive Status: Impaired/Different from baseline Orientation Level: Oriented to person, Disoriented to time, Oriented to place, Oriented to situation Following Commands: Follows one step commands  consistently General Comments: slower processing noted and with expressive difficulties Attention: Selective Selective Attention: Impaired Selective Attention Impairment: Verbal basic, Functional basic Memory: Impaired Memory Impairment: Storage deficit, Retrieval deficit, Decreased recall of new information Awareness: Impaired Awareness Impairment: Emergent impairment, Intellectual impairment Problem Solving: Impaired Problem Solving Impairment: Verbal basic, Functional basic    Extremity Assessment (includes Sensation/Coordination)  Upper Extremity Assessment: RUE deficits/detail RUE Deficits / Details: flaccid RUE   Lower Extremity Assessment: RLE deficits/detail RLE Deficits / Details: flaccid RLE, no evidence of active movement RLE Sensation: decreased light touch RLE Coordination: (absent)    ADLs  Overall ADL's : Needs assistance/impaired Eating/Feeding: Set up, Sitting Eating/Feeding Details (indicate cue type and reason): supported sitting; assist to open containers Grooming: Set  up, Sitting, Wash/dry face Grooming Details (indicate cue type and reason): supported sitting Upper Body Bathing: Sitting, Minimal assistance Upper Body Bathing Details (indicate cue type and reason): pt able to wash chest, abdomen, and RUE; assist to wash LUE  Lower Body Bathing: Moderate assistance, +2 for physical assistance, +2 for safety/equipment, Sit to/from stand Lower Body Bathing Details (indicate cue type and reason): assist to wash lower portion of LEs while seated; pt maintaining static standing with modA using LUE support on bedrail (BSC and pt positioned to face bed rail) while additional therapist washes buttocks  Upper Body Dressing : Sitting, Minimal assistance Upper Body Dressing Details (indicate cue type and reason): donning new gown  Lower Body Dressing: Moderate assistance, Sit to/from stand, +2 for physical assistance, +2 for safety/equipment Toilet Transfer: Moderate assistance, +2 for physical assistance, +2 for safety/equipment, Stand-pivot, BSC Toileting- Clothing Manipulation and Hygiene: Moderate assistance, +2 for physical assistance, +2 for safety/equipment, Sit to/from stand Functional mobility during ADLs: Moderate assistance, +2 for physical assistance, +2 for safety/equipment(for stand pivot transfer) General ADL Comments: pt completing bed mobility and working on static sitting balance; pt able to maintain static sitting briefly without UE support and close minguard, overall reliant on LUE support on bedrail and/or minA from therapist; use of Stedy for transfer to recliner and setup for simple grooming ADLs and to eat lunch; completed PROM to RUE while seated in recliner     Mobility  Overal bed mobility: Needs Assistance Bed Mobility: Supine to Sit Rolling: Min assist Sidelying to sit: Mod assist Supine to sit: Max assist General bed mobility comments: pt demonstrating some active movement in RLE today when attempting bed mobility, requiring assist to fully  advance RLE over EOB; ModA to elevate trunk into sitting, pt using LUE and bedrail to assist with bringing trunk upright     Transfers  Overall transfer level: Needs assistance Equipment used: Ambulation equipment used Transfer via Lift Equipment: Stedy Transfers: Sit to/from Stand Sit to Stand: Mod assist Stand pivot transfers: Mod assist, +2 physical assistance, +2 safety/equipment General transfer comment: Patient able to perform sit <> stand with stedy using LUE and moderate assist. Patient initiated activity upon command.    Ambulation / Gait / Stairs / Office manager / Balance Dynamic Sitting Balance Sitting balance - Comments: close minguard for static sitting  Balance Overall balance assessment: Needs assistance Sitting-balance support: Feet supported, No upper extremity supported, Single extremity supported Sitting balance-Leahy Scale: Fair Sitting balance - Comments: close minguard for static sitting  Postural control: Right lateral lean Standing balance support: During functional activity, Single extremity supported Standing balance-Leahy Scale: Poor Standing balance comment: reliant on UE support/external support from therapist     Special  needs/care consideration BiPAP/CPAP No CPM No Continuous Drip IV No Dialysis No      Life Vest No Oxygen No Special Bed No Trach Size No Wound Vac (area) No      Skin No                            Bowel mgmt: Last BM 12/08/17 Bladder mgmt: External catheter Diabetic mgmt Yes, on insulin at home    Previous Home Environment Living Arrangements: Spouse/significant other  Lives With: Spouse Available Help at Discharge: Family Type of Home: House Home Layout: Two level, Bed/bath upstairs Alternate Level Stairs-Number of Steps: 2 flights with a landing  Home Access: Level entry Bathroom Shower/Tub: Chiropodist: Standard Home Care Services: No Additional Comments: pt's daughter reports  family is looking into arranging for 24hr supervision for the summer   Discharge Living Setting Plans for Discharge Living Setting: Patient's home, House, Lives with (comment)(Lives with husband.) Type of Home at Discharge: House Discharge Home Layout: Two level, Bed/bath upstairs, Able to live on main level with bedroom/bathroom Alternate Level Stairs-Number of Steps: Flight Discharge Home Access: Stairs to enter [employer] of Steps: 1 step entry Does the patient have any problems obtaining your medications?: No  Social/Family/Support Systems Patient Roles: Spouse, Parent Contact Information: Doria Fern - spouse - [phone] Anticipated Caregiver: Husband, daughters and family Ability/Limitations of Caregiver: Husband works 7 am to 5 pm. Caregiver Availability: Other (Comment)(Husband aware of need for 24/7 care after discharge ) Discharge Plan Discussed with Primary Caregiver: Yes Is Caregiver In Agreement with Plan?: Yes Does Caregiver/Family have Issues with Lodging/Transportation while Pt is in Rehab?: No  Goals/Additional Needs Patient/Family Goal for Rehab: PT/OT/SLP min assist goals Expected length of stay: 18-21 days Cultural Considerations: None Dietary Needs: Dys 2, thin liquids Equipment Needs: TBD Pt/Family Agrees to Admission and willing to participate: Yes Program Orientation Provided & Reviewed with Pt/Caregiver Including Roles  & Responsibilities: Yes  Decrease burden of Care through IP rehab admission: N/A  Possible need for SNF placement upon discharge: Not planned  Patient Condition: This patient's condition remains as documented in the consult dated 12/08/17, in which the Rehabilitation Physician determined and documented that the patient's condition is appropriate for intensive rehabilitative care in an inpatient rehabilitation facility. Will admit to inpatient rehab today.  Preadmission Screen Completed By:  Retta Diones, 12/09/2017 1:26  PM ______________________________________________________________________   Discussed status with Dr. Letta Pate on 12/09/17 at 1339 and received telephone approval for admission today.  Admission Coordinator:  Retta Diones, time 1339/Date 12/09/17

## 2017-12-09 NOTE — Progress Notes (Signed)
Admitted to Unit via bed. Oriented to Unit, Therapy schedule and medications. States understanding of information reviewed. Ellouise Newer

## 2017-12-09 NOTE — Progress Notes (Signed)
Kelly Gibson returned call after reading previous MD notes. No new orders but will continue to monitor since all are aware of the pauses.

## 2017-12-09 NOTE — Progress Notes (Signed)
[facility] given to [facility]. [facility]. Patient and husband updated.

## 2017-12-09 NOTE — Care Management Note (Signed)
Case Management Note  Patient Details  Name: Kelly Gibson MRN: [id number] Date of Birth: Mar 26, 1961  Subjective/Objective:                    Action/Plan: Pt discharging to CIR today. CM singing off.   Expected Discharge Date:  12/09/17               Expected Discharge Plan:  [place]  In-House Referral:     Discharge planning Services  CM Consult  Post Acute Care Choice:    Choice offered to:     DME Arranged:    DME Agency:     HH Arranged:    HH Agency:     Status of Service:  Completed, signed off  If discussed at [employer], dates discussed:    Additional Comments:  Pollie Friar, RN 12/09/2017, 2:38 PM

## 2017-12-10 ENCOUNTER — Inpatient Hospital Stay (HOSPITAL_COMMUNITY): Payer: BLUE CROSS/BLUE SHIELD | Admitting: Speech Pathology

## 2017-12-10 ENCOUNTER — Inpatient Hospital Stay (HOSPITAL_COMMUNITY): Payer: BLUE CROSS/BLUE SHIELD | Admitting: Occupational Therapy

## 2017-12-10 ENCOUNTER — Inpatient Hospital Stay (HOSPITAL_COMMUNITY): Payer: BLUE CROSS/BLUE SHIELD | Admitting: Physical Therapy

## 2017-12-10 ENCOUNTER — Inpatient Hospital Stay (HOSPITAL_COMMUNITY): Payer: BLUE CROSS/BLUE SHIELD

## 2017-12-10 DIAGNOSIS — I6300 Cerebral infarction due to thrombosis of unspecified precerebral artery: Secondary | ICD-10-CM

## 2017-12-10 LAB — CBC WITH DIFFERENTIAL/PLATELET
Basophils Absolute: 0.1 10*3/uL (ref 0.0–0.1)
Basophils Relative: 1 %
EOS PCT: 4 %
Eosinophils Absolute: 0.2 10*3/uL (ref 0.0–0.7)
HEMATOCRIT: 37.6 % (ref 36.0–46.0)
HEMOGLOBIN: 12.3 g/dL (ref 12.0–15.0)
LYMPHS ABS: 1.9 10*3/uL (ref 0.7–4.0)
Lymphocytes Relative: 36 %
MCH: 26.9 pg (ref 26.0–34.0)
MCHC: 32.7 g/dL (ref 30.0–36.0)
MCV: 82.1 fL (ref 78.0–100.0)
MONO ABS: 0.6 10*3/uL (ref 0.1–1.0)
MONOS PCT: 12 %
NEUTROS ABS: 2.5 10*3/uL (ref 1.7–7.7)
Neutrophils Relative %: 47 %
Platelets: 398 10*3/uL (ref 150–400)
RBC: 4.58 MIL/uL (ref 3.87–5.11)
RDW: 13.2 % (ref 11.5–15.5)
WBC: 5.3 10*3/uL (ref 4.0–10.5)

## 2017-12-10 LAB — GLUCOSE, CAPILLARY
GLUCOSE-CAPILLARY: 117 mg/dL — AB (ref 65–99)
GLUCOSE-CAPILLARY: 101 mg/dL — AB (ref 65–99)
GLUCOSE-CAPILLARY: 100 mg/dL — AB (ref 65–99)
Glucose-Capillary: 119 mg/dL — ABNORMAL HIGH (ref 65–99)

## 2017-12-10 LAB — COMPREHENSIVE METABOLIC PANEL
ALK PHOS: 59 U/L (ref 38–126)
ALT: 17 U/L (ref 14–54)
ANION GAP: 11 (ref 5–15)
AST: 26 U/L (ref 15–41)
Albumin: 3.4 g/dL — ABNORMAL LOW (ref 3.5–5.0)
BILIRUBIN TOTAL: 0.5 mg/dL (ref 0.3–1.2)
BUN: 27 mg/dL — ABNORMAL HIGH (ref 6–20)
CALCIUM: 9.4 mg/dL (ref 8.9–10.3)
CO2: 25 mmol/L (ref 22–32)
Chloride: 106 mmol/L (ref 101–111)
Creatinine, Ser: 1.02 mg/dL — ABNORMAL HIGH (ref 0.44–1.00)
GLUCOSE: 124 mg/dL — AB (ref 65–99)
Potassium: 3.6 mmol/L (ref 3.5–5.1)
Sodium: 142 mmol/L (ref 135–145)
TOTAL PROTEIN: 7.3 g/dL (ref 6.5–8.1)

## 2017-12-10 NOTE — Evaluation (Signed)
Occupational Therapy Assessment and Plan  Patient Details  Name: Kelly Gibson MRN: [id number] Date of Birth: 05/31/1961  OT Diagnosis: abnormal posture, apraxia, cognitive deficits, disturbance of vision and hemiplegia affecting dominant side Rehab Potential: Rehab Potential (ACUTE ONLY): Good ELOS: 24-28 days   Today's Date: 12/10/2017 OT Individual Time: 1100-1208 OT Individual Time Calculation (min): 68 min     Problem List:  Patient Active Problem List   Diagnosis Date Noted  . CVA (cerebral vascular accident) ([place]) 12/09/2017  . Hemiparesis affecting right side as late effect of cerebrovascular accident (CVA) ([place])   . Dysarthria, post-stroke   . Gait disturbance, post-stroke   . Acute CVA (cerebrovascular accident) ([place]) 12/05/2017  . Bradycardia   . Essential hypertension   . Weakness 10/26/2017  . Cerebrovascular disease 10/18/2017  . COPD (chronic obstructive pulmonary disease) ([place]) 02/18/2015  . Loss of weight 03/15/2014  . Non compliance w medication regimen 06/17/2013  . Post-menopause bleeding 06/16/2013  . Bronchitis 05/04/2012  . Tobacco user 05/04/2012  . Ganglion cyst 03/09/2012  . Anxiety 03/09/2012  . Essential hypertension, benign 12/17/2011  . Obesity, unspecified 12/17/2011  . Hyperlipidemia 12/17/2011  . Depression 12/17/2011  . Diabetes mellitus type II, uncontrolled ([place]) 12/14/2011    Past Medical History:  Past Medical History:  Diagnosis Date  . COPD (chronic obstructive pulmonary disease) ([place])   . Depression   . Diabetes mellitus   . Hyperlipidemia   . Hypertension    Past Surgical History:  Past Surgical History:  Procedure Laterality Date  . CESAREAN SECTION     x 3  . TUMOR REMOVAL     left shoulder    Assessment & Plan Clinical Impression: Kelly Gibson is a 57 year old right-handed female with history of diabetes mellitus, COPD/tobacco abuse, hypertension, hyperlipidemia as well as recent CVA in March with mild  right-sided residual weakness maintained on aspirin 81 mg daily as well as loop recorder.  Per chart review she lives with her spouse.  Two-level home with bed and bath upstairs.  She was independent driving performing all ADLs prior to admission.  Her husband works during the day they have 3 daughters in the area.  Presented 12/05/2017 with increasing right-sided weakness slurred speech and dizziness as well as reported weight loss.  Loop recorder did show 3 to 4-second pauses initial heart rate in the 30s.  MRI of the brain showed small acute lacunar infarction along the left frontal horn periventricular white matter.  Small subacute lacunar infarction in the left posterior corona radiata.  Numerous chronic lacunar infarcts in the deep gray matter nucllei, brainstem and cerebral white matter including the corpus callosum.  CT angiogram of the head neck negative for large vessel occlusion.  Positive for widespread atherosclerosis.  Echocardiogram with ejection fraction of 60% no wall motion abnormalities with grade 1 diastolic dysfunction.  Neurology follow-up aspirin 325 mg daily for CVA prophylaxis.  Subcutaneous Lovenox for DVT prophylaxis.  Dysphasia #2 thin liquid diet.  CT of the chest for evaluation of weight loss that showed nonspecific mildly enlarged mediastinal bilateral hilar lymph nodes as well as 5 mm right upper lobe nodule with recommendations for noncontrast chest CT 12 months...  Physical and occupational therapy evaluations completed with recommendations of physical medicine rehab consult.  Patient transferred to CIR on 12/09/2017 .    Patient currently requires max with basic self-care skills secondary to decreased cardiorespiratoy endurance, abnormal tone and motor apraxia, decreased visual motor skills, decreased midline orientation, decreased problem solving, decreased safety  awareness and decreased memory and decreased sitting balance, decreased standing balance, decreased postural control,  hemiplegia and decreased balance strategies.  Prior to hospitalization, patient was fully independent.    Patient will benefit from skilled intervention to increase independence with basic self-care skills prior to discharge home with care partner.  Anticipate patient will require minimal physical assistance and follow up home health.  OT - End of Session Activity Tolerance: Tolerates 10 - 20 min activity with multiple rests OT Assessment Rehab Potential (ACUTE ONLY): Good OT Patient demonstrates impairments in the following area(s): Balance;Cognition;Endurance;Motor;Perception;Sensory;Safety;Vision OT Basic ADL's Functional Problem(s): Eating;Grooming;Bathing;Dressing;Toileting OT Transfers Functional Problem(s): Toilet;Tub/Shower OT Additional Impairment(s): Fuctional Use of Upper Extremity OT Plan OT Intensity: Minimum of 1-2 x/day, 45 to 90 minutes OT Frequency: 5 out of 7 days OT Duration/Estimated Length of Stay: 24-28 days OT Treatment/Interventions: Balance/vestibular training;Cognitive remediation/compensation;Discharge planning;DME/adaptive equipment instruction;Functional electrical stimulation;Functional mobility training;Neuromuscular re-education;Patient/family education;Psychosocial support;Self Care/advanced ADL retraining;Therapeutic Activities;Therapeutic Exercise;UE/LE Strength taining/ROM;UE/LE Coordination activities;Visual/perceptual remediation/compensation OT Self Feeding Anticipated Outcome(s): supervision/ set up OT Basic Self-Care Anticipated Outcome(s): supervision UB self care, steadying A LB self care OT Toileting Anticipated Outcome(s): steadying A OT Bathroom Transfers Anticipated Outcome(s): steadying A OT Recommendation Patient destination: Home Follow Up Recommendations: Home health OT Equipment Recommended: 3 in 1 bedside comode;Tub/shower bench   Skilled Therapeutic Intervention Pt seen for initial evaluation and ADL training. Pt worked on bed mobility  skills, sitting EOB focusing on midline posture as pt leans to her R, transfers, sit to stand, and B/D at sink level.  Due to hemiparesis with increased tone, pt requires max A with most tasks. She responds well to cues with movement patterns. Reviewed goals, OT POC, ELOS.  Pt resting in w/c with QRB and half lap tray to support her R arm.   OT Evaluation Precautions/Restrictions  Precautions Precautions: Fall Precaution Comments: bradycardia Restrictions Weight Bearing Restrictions: No    Pain Pain Assessment Pain Score: 0-No pain Home Living/Prior Functioning Home Living Family/patient expects to be discharged to:: Private residence Living Arrangements: Spouse/significant other Available Help at Discharge: Family Type of Home: House Home Access: Level entry Home Layout: Two level, Bed/bath upstairs, Able to live on main level with bedroom/bathroom Alternate Level Stairs-Number of Steps: 2 flights with a landing  Bathroom Shower/Tub: Tub/shower unit Additional Comments: pt's daughter reports family is looking into arranging for 24hr supervision for the summer   Lives With: Spouse Prior Function Level of Independence: Independent with basic ADLs, Independent with homemaking with ambulation, Independent with gait, Independent with transfers  Able to Take Stairs?: Yes Driving: Yes Vocation: Retired Leisure: Hobbies-yes (Comment) Comments: enjoys cooking, taking dog to dog park - has 2 dogs ADL ADL ADL Comments: refer to functional navigator Vision Baseline Vision/History: Wears glasses Wears Glasses: Reading only Patient Visual Report: Diplopia;Blurring of vision Vision Assessment?: Yes Eye Alignment: Within Functional Limits(appears functional, pt reports intermittent diplopia) Ocular Range of Motion: Within Functional Limits Alignment/Gaze Preference: Within Defined Limits Tracking/Visual Pursuits: Decreased smoothness of vertical tracking;Decreased smoothness of  horizontal tracking;Unable to hold eye position out of midline;Requires cues, head turns, or add eye shifts to track Visual Fields: No apparent deficits Perception  Perception: Impaired(decreased midline awareness, no inattention or neglect noted, good L/R and awareness) Praxis Praxis: Impaired Praxis Impairment Details: Motor planning Praxis-Other Comments: impaired motor planning with RLE, good praxis with self care tasks Cognition Overall Cognitive Status: Impaired/Different from baseline Arousal/Alertness: Awake/alert Orientation Level: Person;Place;Situation Person: Oriented Place: Oriented Situation: Oriented Year: 2019 Month: May Day of Week: Correct  Memory: Impaired Memory Impairment: Storage deficit;Retrieval deficit;Decreased recall of new information Immediate Memory Recall: Sock;Blue;Bed Memory Recall: Sock;Blue;Bed Memory Recall Sock: With Cue Memory Recall Blue: Without Cue Memory Recall Bed: With Cue Selective Attention: Impaired Selective Attention Impairment: Verbal complex;Functional complex Awareness: Impaired Awareness Impairment: Emergent impairment;Anticipatory impairment Problem Solving: Impaired Safety/Judgment: Impaired Comments: mildly impulsive Sensation Sensation Light Touch: Impaired Detail Light Touch Impaired Details: Impaired RUE;Impaired RLE Stereognosis: Impaired Detail Stereognosis Impaired Details: Impaired RUE Hot/Cold: Appears Intact Proprioception: Impaired Detail Proprioception Impaired Details: Impaired RUE;Impaired LLE Coordination Gross Motor Movements are Fluid and Coordinated: No Fine Motor Movements are Fluid and Coordinated: No Coordination and Movement Description: dense R hemiplegia UE and LE with hypertone in internal rotators and elbow flexors Finger Nose Finger Test: not tested on LUE, RUE no active movement Motor  Motor Motor: Motor apraxia;Hemiplegia;Abnormal tone Motor - Skilled Clinical Observations: dense R  hemiplegia. R LE apraxia.  Mobility  Bed Mobility Bed Mobility: Rolling Right;Supine to Sit;Sit to Supine;Rolling Left Rolling Right: 4: Min assist Rolling Right Details: Verbal cues for precautions/safety;Manual facilitation for placement;Verbal cues for technique;Verbal cues for sequencing Rolling Left: 2: Max assist Rolling Left Details: Verbal cues for precautions/safety;Verbal cues for safe use of DME/AE;Manual facilitation for placement;Manual facilitation for weight shifting;Verbal cues for technique;Verbal cues for sequencing;Visual cues/gestures for precautions/safety;Visual cues/gestures for sequencing;Visual cues for safe use of DME/AE Supine to Sit: 2: Max assist Supine to Sit Details: Verbal cues for technique;Verbal cues for precautions/safety;Verbal cues for safe use of DME/AE;Verbal cues for sequencing;Visual cues/gestures for sequencing Sit to Supine: 2: Max assist Sit to Supine - Details: Verbal cues for technique;Verbal cues for precautions/safety;Verbal cues for safe use of DME/AE;Manual facilitation for weight shifting;Manual facilitation for placement;Verbal cues for sequencing;Tactile cues for placement;Visual cues/gestures for precautions/safety Transfers Sit to Stand: 3: Mod assist  Trunk/Postural Assessment  Cervical Assessment Cervical Assessment: Exceptions to [facility] Thoracic Assessment Thoracic Assessment: (lateral spinal flexion to L) Lumbar Assessment Lumbar Assessment: Exceptions to [facility] Postural Control Postural Control: (R lean) Righting Reactions: absent  Protective Responses: delayed   Balance Static Sitting Balance Static Sitting - Level of Assistance: 4: Min assist Dynamic Sitting Balance Dynamic Sitting - Level of Assistance: 2: Max assist Sitting balance - Comments: max with reaching to her R side/ R foot Static Standing Balance Static Standing - Level of Assistance: 2: Max assist Extremity/Trunk Assessment RUE Assessment RUE Assessment:  Exceptions to WFL(no active movement) RUE PROM (degrees) Right Shoulder External Rotation: 45 Degrees RUE Tone RUE Tone: Hypertonic Hypertonic Details: hypertone in internal rotators and elbow flexors LUE Assessment LUE Assessment: Within Functional Limits   See Function Navigator for Current Functional Status.   Refer to Care Plan for Long Term Goals  Recommendations for other services: None    Discharge Criteria: Patient will be discharged from OT if patient refuses treatment 3 consecutive times without medical reason, if treatment goals not met, if there is a change in medical status, if patient makes no progress towards goals or if patient is discharged from hospital.  The above assessment, treatment plan, treatment alternatives and goals were discussed and mutually agreed upon: by patient  [facility] 12/10/2017, 12:53 PM

## 2017-12-10 NOTE — Evaluation (Signed)
Speech Language Pathology Assessment and Plan  Patient Details  Name: Kelly Gibson MRN: [id number] Date of Birth: 06/06/1961  SLP Diagnosis: Dysphagia;Dysarthria;Cognitive Impairments  Rehab Potential: Good ELOS: 24-28 days     Today's Date: 12/10/2017 SLP Individual Time: 2725-3664 SLP Individual Time Calculation (min): 60 min   Problem List:  Patient Active Problem List   Diagnosis Date Noted  . CVA (cerebral vascular accident) ([place]) 12/09/2017  . Hemiparesis affecting right side as late effect of cerebrovascular accident (CVA) ([place])   . Dysarthria, post-stroke   . Gait disturbance, post-stroke   . Acute CVA (cerebrovascular accident) ([place]) 12/05/2017  . Bradycardia   . Essential hypertension   . Weakness 10/26/2017  . Cerebrovascular disease 10/18/2017  . COPD (chronic obstructive pulmonary disease) ([place]) 02/18/2015  . Loss of weight 03/15/2014  . Non compliance w medication regimen 06/17/2013  . Post-menopause bleeding 06/16/2013  . Bronchitis 05/04/2012  . Tobacco user 05/04/2012  . Ganglion cyst 03/09/2012  . Anxiety 03/09/2012  . Essential hypertension, benign 12/17/2011  . Obesity, unspecified 12/17/2011  . Hyperlipidemia 12/17/2011  . Depression 12/17/2011  . Diabetes mellitus type II, uncontrolled ([place]) 12/14/2011   Past Medical History:  Past Medical History:  Diagnosis Date  . COPD (chronic obstructive pulmonary disease) ([place])   . Depression   . Diabetes mellitus   . Hyperlipidemia   . Hypertension    Past Surgical History:  Past Surgical History:  Procedure Laterality Date  . CESAREAN SECTION     x 3  . TUMOR REMOVAL     left shoulder    Assessment / Plan / Recommendation Clinical Impression Patient  is a 57 year old right-handed female with history of diabetes mellitus, COPD/tobacco abuse, hypertension, hyperlipidemia as well as recent CVA in March with mild right-sided residual weakness maintained on aspirin 81 mg daily as well as loop  recorder. Per chart review she lives with her spouse. Two-level home with bed and bath upstairs. She was independent driving performing all ADLs prior to admission. Her husband works during the day they have 3 daughters in the area. Presented 12/05/2017 with increasing right-sided weakness slurred speech and dizziness as well as reported weight loss. Loop recorder did show 3 to 4-second pauses initial heart rate in the 30s. MRI of the brain showed small acute lacunar infarction along the left frontal horn periventricular white matter. Small subacute lacunar infarction in the left posterior corona radiata. Numerous chronic lacunar infarcts in the deep gray matter nucllei, brainstem and cerebral white matter including the corpus callosum. CT angiogram of the head neck negative for large vessel occlusion. Positive for widespread atherosclerosis. Echocardiogram with ejection fraction of 60% no wall motion abnormalities with grade 1 diastolic dysfunction. Neurology follow-up aspirin 325 mg daily for CVA prophylaxis. Subcutaneous Lovenox for DVT prophylaxis. Dysphasia #2 thin liquid diet. CT of the chestfor evaluation of weight loss that showed nonspecific mildly enlarged mediastinal bilateral hilar lymph nodes as well as 5 mm right upper lobe nodule with recommendations for noncontrast chest CT 12 months... Physical and occupational therapy evaluations completed with recommendations of physical medicine rehab consult. Patient transferred to CIR on 12/09/2017.  Patient scored 11/22 points on the MoCA-Blind with a score of 18 or above considered normal. Patient demonstrates deficits in immediate and short term recall and problem solving which impacts her safety with functional and familiar tasks. Patient also demonstrates a moderate dysarthria characterized by low vocal intensity, imprecise consonants and minimal movement of the oral cavity impacting speech intelligibility at the phrase level.  Patient  consumed lunch meal of Dys. 2 textures with thin liquids via straw and demonstrated anterior spillage, prolonged AP transit, impaired mastication, oral residue with solids and intermittent oral holding with thin liquids. However, no overt s/s of aspiration were observed. Recommend patient continue current diet with full supervision to maximize safety and use of swallowing strategies. Patient would benefit from skilled SLP intervention to maximize her communication and cognitive and swallowing function prior to discharge.   Skilled Therapeutic Interventions          Administered a cognitive-linguistic evaluation and BSE. Please see above for details. Educated patient in regards to her current cognitive, communication and swallowing impairments and goals of skilled SLP intervention. She verbalized understanding and agreement.   SLP Assessment  Patient will need skilled Speech Lanaguage Pathology Services during CIR admission    Recommendations  SLP Diet Recommendations: Dysphagia 2 (Fine chop);Thin Liquid Administration via: Straw Medication Administration: Whole meds with puree Supervision: Patient able to self feed;Full supervision/cueing for compensatory strategies Compensations: Slow rate;Small sips/bites;Lingual sweep for clearance of pocketing;Monitor for anterior loss;Minimize environmental distractions Postural Changes and/or Swallow Maneuvers: Seated upright 90 degrees Oral Care Recommendations: Oral care BID Recommendations for Other Services: Neuropsych consult Patient destination: Home Follow up Recommendations: Home Health SLP;24 hour supervision/assistance;Outpatient SLP    SLP Frequency 3 to 5 out of 7 days   SLP Duration  SLP Intensity  SLP Treatment/Interventions 24-28 days   Minumum of 1-2 x/day, 30 to 90 minutes  Cognitive remediation/compensation;Environmental controls;Internal/external aids;Speech/Language facilitation;Therapeutic Activities;Patient/family  education;Functional tasks;Dysphagia/aspiration precaution training;Cueing hierarchy    Pain Pain Assessment Pain Score: 0-No pain  Prior Functioning Type of Home: House  Lives With: Spouse Available Help at Discharge: Family Vocation: Retired  Function:  Eating Eating   Modified Consistency Diet: Yes Eating Assist Level: Set up assist for;Supervision or verbal cues;More than reasonable amount of time   Eating Set Up Assist For: Opening containers       Cognition Comprehension Comprehension assist level: Understands basic 75 - 89% of the time/ requires cueing 10 - 24% of the time  Expression   Expression assist level: Expresses basic 50 - 74% of the time/requires cueing 25 - 49% of the time. Needs to repeat parts of sentences.  Social Interaction Social Interaction assist level: Interacts appropriately 90% of the time - Needs monitoring or encouragement for participation or interaction.  Problem Solving Problem solving assist level: Solves basic 50 - 74% of the time/requires cueing 25 - 49% of the time  Memory Memory assist level: Recognizes or recalls 25 - 49% of the time/requires cueing 50 - 75% of the time   Short Term Goals: Week 1: SLP Short Term Goal 1 (Week 1): Patient will consume current diet with minimal overt s/s of aspiration and supervision verbal cues for use of swallowing compensatory strategies.  SLP Short Term Goal 2 (Week 1): Patient will decrease oral holding and initiate a timely swallow with thin liquids in 50% of opportunities with Mod A multimodal cues.  SLP Short Term Goal 3 (Week 1): Patient will recall new, daily information with use of external aids with Mod A multimodal cues.  SLP Short Term Goal 4 (Week 1): Patient will demonstrate functional problem solving for basic and familiar tasks with Min A verbal cues.  SLP Short Term Goal 5 (Week 1): Patient will utilize speech intelligibility strategies at the phrase level with Min A verbal cues to achieve  75% of intelligibility.   Refer to Care Plan for Long  Term Goals  Recommendations for other services: Neuropsych  Discharge Criteria: Patient will be discharged from SLP if patient refuses treatment 3 consecutive times without medical reason, if treatment goals not met, if there is a change in medical status, if patient makes no progress towards goals or if patient is discharged from hospital.  The above assessment, treatment plan, treatment alternatives and goals were discussed and mutually agreed upon: by patient  Jahziel Sinn 12/10/2017, 3:36 PM

## 2017-12-10 NOTE — Progress Notes (Signed)
Subjective/Complaints:  No issues overnite  ROS denies CP, SOB, N/V/D  Objective: Vital Signs: Blood pressure (!) 170/106, pulse 81, temperature 99 F (37.2 C), temperature source Oral, height 5\' 2"  (1.575 m), weight 67.4 kg (148 lb 9.4 oz), last menstrual period 12/10/2012, SpO2 99 %. Ct Chest W Contrast  Result Date: 12/08/2017 CLINICAL DATA:  Mediastinal adenopathy, unintended weight loss, nonlocalized abdominal pain EXAM: CT CHEST WITH CONTRAST TECHNIQUE: Multidetector CT imaging of the chest was performed during intravenous contrast administration. Sagittal and coronal MPR images reconstructed from axial data set. CONTRAST:  150mL OMNIPAQUE IOHEXOL 300 MG/ML  SOLN IV COMPARISON:  None; correlation chest radiograph 08/15/2016 FINDINGS: Cardiovascular: Aorta normal caliber with minimal atherosclerotic calcification. Extensive coronary arterial calcifications. Minimal pericardial effusion. Central pulmonary arteries grossly patent on nondedicated exam. Mediastinum/Nodes: Multiple normal sized to mildly enlarged mediastinal and hilar lymph nodes are identified. AP window node 13 mm short axis image 65. RIGHT hilar node 14 mm short axis image 62. RIGHT paratracheal node 13 mm short axis image 52. Base of cervical region normal appearance. Mild nonspecific with stranding of fat at the superior LEFT axilla. Esophagus unremarkable. Lungs/Pleura: Diffuse peribronchial thickening. Subsegmental atelectasis LEFT lower lobe. Minimal subsegmental atelectasis in RIGHT middle lobe. 5 mm nodular density RIGHT upper lobe image 47. No acute infiltrate, pleural effusion, or pneumothorax. Upper Abdomen: Visualized upper abdomen unremarkable. Musculoskeletal: No acute osseous findings. IMPRESSION: Nonspecific mildly enlarged mediastinal and BILATERAL hilar lymph nodes; these could be seen as a reactive process, metastatic disease, lymphoma, and Castleman disease. The lack of nodal calcifications makes sarcoidosis less  likely. Moderate bronchitic changes with subsegmental atelectasis LEFT lower lobe. Extensive coronary arterial calcifications and minimal pericardial effusion. 5 mm RIGHT upper lobe nodule, recommendation below. No follow-up needed if patient is low-risk. Non-contrast chest CT can be considered in 12 months if patient is high-risk. This recommendation follows the consensus statement: Guidelines for Management of Incidental Pulmonary Nodules Detected on CT Images: From the Fleischner Society 2017; Radiology 2017; 284:228-243. Aortic Atherosclerosis (ICD10-I70.0). Electronically Signed   By: Lavonia Dana M.D.   On: 12/08/2017 13:05   Results for orders placed or performed during the hospital encounter of 12/09/17 (from the past 72 hour(s))  Glucose, capillary     Status: Abnormal   Collection Time: 12/09/17  4:25 PM  Result Value Ref Range   Glucose-Capillary 119 (H) 65 - 99 mg/dL     HEENT: normal Cardio: RRR and no murmur Resp: CTA B/L and unlabored GI: BS positive and NT, ND Extremity:  Pulses positive and No Edema Skin:   Intact Neuro: Alert/Oriented, Normal Sensory, Abnormal Motor 0/5 RUE adn RLE , 5/5 on left side and Dysarthric Musc/Skel:  Normal Gen NAD   Assessment/Plan: 1. Functional deficits secondary to Right hemiplegia left subcortical infarcts which require 3+ hours per day of interdisciplinary therapy in a comprehensive inpatient rehab setting. Physiatrist is providing close team supervision and 24 hour management of active medical problems listed below. Physiatrist and rehab team continue to assess barriers to discharge/monitor patient progress toward functional and medical goals. FIM:                                  Medical Problem List and Plan: 1.Right hemiplegia with dysarthria and dysphagiasecondary to left periventricular white matter left corona radiata infarctions as well as history of left brain infarction March 2019 Rehab evals today 2.  DVT Prophylaxis/Anticoagulation: Subcutaneous Lovenox. Monitor  for any bleeding episodes 3. Pain Management:Tylenol as needed 4. Mood:Provide emotional support 5. Neuropsych: This patientiscapable of making decisions on herown behalf. 6. Skin/Wound Care:Routine skin checks 7. Fluids/Electrolytes/Nutrition:Routine in and outs with follow-up chemistries 8.Dysphagia. Dysphagia #2 thin liquids. Follow-up speech therapy 9.Hypertension. Norvasc 5 mg daily, lisinopril 40 mg daily Vitals:   12/09/17 1600  BP: (!) 170/106  Pulse: 81  Temp: 99 F (37.2 C)  SpO2: 99%  Goal <180/110 over the next 48-72 h then may start tightening control 10.Diabetes mellitus with peripheral neuropathy. Hemoglobin A1c 8.4. Sliding scale insulin. Check blood sugars before meals and at bedtime. Patient on DiaBeta 2.5 mg daily, insulin glargine 10 units nightly. Resume as needed CBG (last 3)  Recent Labs    12/09/17 0614 12/09/17 1136 12/09/17 1625  GLUCAP 96 95 119*  Controlled 5/17 11.Hyperlipidemia. Crestor 12.COPD with tobacco abuse. NicoDerm patch. Provide counseling. Inhalers as directed. 13.5 mm right upper lobe nodule with nonspecific mildly enlarged mediastinal bilateral hilar lymph nodes per CT of the chest 12/08/2017. Recommendations are for noncontrast chest CT 12 months for evaluation  LOS (Days) 1 A FACE TO FACE EVALUATION WAS PERFORMED  Charlett Blake 12/10/2017, 6:11 AM

## 2017-12-10 NOTE — IPOC Note (Addendum)
Overall Plan of Care [facility]) Patient Details Name: Kelly Gibson MRN: [id number] DOB: 11/16/1960   Admitting Diagnosis: <principal problem not specified>cva  Hospital Problems: Active Problems:   Essential hypertension   CVA (cerebral vascular accident) ([place])   Hemiparesis affecting right side as late effect of cerebrovascular accident (CVA) ([place])   Dysarthria, post-stroke   Gait disturbance, post-stroke     Functional Problem List: Nursing Nutrition, Bladder, Safety, Sensory, Medication Management, Motor  PT Balance, Behavior, Endurance, Motor, Nutrition, Perception, Safety, Sensory  OT Balance, Cognition, Endurance, Motor, Perception, Sensory, Safety, Vision  SLP Cognition  TR         Basic ADL's: OT Eating, Grooming, Bathing, Dressing, Toileting     Advanced  ADL's: OT       Transfers: PT Bed Mobility, Bed to Chair, Furniture, Teacher, early years/pre, Metallurgist: PT Ambulation, Emergency planning/management officer, Stairs     Additional Impairments: OT Fuctional Use of Upper Extremity  SLP Swallowing, Social Cognition expression Problem Solving, Attention, Awareness  TR      Anticipated Outcomes Item Anticipated Outcome  Self Feeding supervision/ set up  Swallowing  Mod I with least restrictive diet    Basic self-care  supervision UB self care, steadying A LB self care  Toileting  steadying A   Bathroom Transfers steadying A  Bowel/Bladder  Min assist   Transfers  Supervision assist with LRAD   Locomotion  Min assist with LRAD at household level   Communication  Supervision   Cognition  Min A   Pain  N/A  Safety/Judgment  Cues and reminders   Therapy Plan: PT Intensity: Minimum of 1-2 x/day ,45 to 90 minutes PT Frequency: 5 out of 7 days PT Duration Estimated Length of Stay: 25-28 days  OT Intensity: Minimum of 1-2 x/day, 45 to 90 minutes OT Frequency: 5 out of 7 days OT Duration/Estimated Length of Stay: 24-28 days SLP Intensity: Minumum of 1-2  x/day, 30 to 90 minutes SLP Frequency: 3 to 5 out of 7 days SLP Duration/Estimated Length of Stay: 24-28 days     Team Interventions: Nursing Interventions Patient/Family Education, Disease Management/Prevention, Discharge Planning, Bladder Management, Medication Management, Dysphagia/Aspiration Precaution Training  PT interventions Ambulation/gait training, Training and development officer, Cognitive remediation/compensation, Community reintegration, Discharge planning, Disease management/prevention, DME/adaptive equipment instruction, Functional electrical stimulation, Pain management, Neuromuscular re-education, Functional mobility training, Patient/family education, Psychosocial support, Skin care/wound management, Splinting/orthotics, Stair training, Therapeutic Activities, Therapeutic Exercise, UE/LE Strength taining/ROM, UE/LE Coordination activities, Wheelchair propulsion/positioning, Visual/perceptual remediation/compensation  OT Interventions Training and development officer, Cognitive remediation/compensation, Discharge planning, DME/adaptive equipment instruction, Functional electrical stimulation, Functional mobility training, Neuromuscular re-education, Patient/family education, Psychosocial support, Self Care/advanced ADL retraining, Therapeutic Activities, Therapeutic Exercise, UE/LE Strength taining/ROM, UE/LE Coordination activities, Visual/perceptual remediation/compensation  SLP Interventions Cognitive remediation/compensation, Environmental controls, Internal/external aids, Speech/Language facilitation, Therapeutic Activities, Patient/family education, Functional tasks, Dysphagia/aspiration precaution training, Cueing hierarchy  TR Interventions    SW/CM Interventions Discharge Planning, Psychosocial Support, Patient/Family Education   Barriers to Discharge MD  Medical stability  Nursing      PT Inaccessible home environment, Decreased caregiver support, Home environment access/layout     OT      SLP      SW Decreased caregiver support Does not have 24 hr care husband works M-F daytime   Team Discharge Planning: Destination: PT-Home ,OT- Home , SLP-Home Projected Follow-up: PT-Home health PT, OT-  Home health OT, SLP-Home Health SLP, 24 hour supervision/assistance, Outpatient SLP Projected Equipment Needs: PT-Rolling walker with 5" wheels, Wheelchair (measurements),  Wheelchair cushion (measurements), OT- 3 in 1 bedside comode, Tub/shower bench, SLP-  Equipment Details: PT- , OT-  Patient/family involved in discharge planning: PT- Patient,  OT-Patient, SLP-Patient  MD ELOS: 24-28 days Medical Rehab Prognosis:  Excellent Assessment: The patient has been admitted for CIR therapies with the diagnosis of CVA. The team will be addressing functional mobility, strength, stamina, balance, safety, adaptive techniques and equipment, self-care, bowel and bladder mgt, patient and caregiver education, NMR, cognitive-perceptual awareness, community reentry, communication. Goals have been set at min assist to contact guard assist.    Meredith Staggers, MD, [facility]      See Team Conference Notes for weekly updates to the plan of care

## 2017-12-10 NOTE — Plan of Care (Signed)
  Problem: Consults Goal: RH STROKE PATIENT EDUCATION Description See Patient Education module for education specifics  Outcome: Progressing   Problem: RH BLADDER ELIMINATION Goal: RH STG MANAGE BLADDER WITH ASSISTANCE Description STG Manage Bladder With Min Assistance  Outcome: Progressing   Problem: RH SAFETY Goal: RH STG ADHERE TO SAFETY PRECAUTIONS W/ASSISTANCE/DEVICE Description STG Adhere to Safety Precautions With Assistance/Device. With cues and reminders.  Outcome: Progressing Goal: RH STG DECREASED RISK OF FALL WITH ASSISTANCE Description STG Decreased Risk of Fall With Assistance. Cues and reminders.  Outcome: Progressing   Problem: RH KNOWLEDGE DEFICIT Goal: RH STG INCREASE KNOWLEDGE OF DIABETES Description Pt.'s husband will be able to demonstrate knowledge of how to use medications, CBG monitoring, diet with cues and reminders.  Outcome: Progressing Goal: RH STG INCREASE KNOWLEDGE OF HYPERTENSION Description Husband will be able to demonstrate knowledge of medications and diet with cues and reminders.  Outcome: Progressing Goal: RH STG INCREASE KNOWLEDGE OF DYSPHAGIA/FLUID INTAKE Description Husband will be able to demonstrate dysphagia management with diet cues and reminders.  Outcome: Progressing

## 2017-12-10 NOTE — Evaluation (Signed)
Physical Therapy Assessment and Plan  Patient Details  Name: Kelly Gibson MRN: [id number] Date of Birth: 03-04-1961  PT Diagnosis: Abnormal posture, Abnormality of gait, Hemiplegia dominant, Impaired cognition, Impaired sensation and Muscle weakness Rehab Potential: Good ELOS: 25-28 days    Today's Date: 12/11/2017 PT Individual Time:1000-1100   60 min    Problem List:  Patient Active Problem List   Diagnosis Date Noted  . CVA (cerebral vascular accident) (Kelly Gibson) 12/09/2017  . Hemiparesis affecting right side as late effect of cerebrovascular accident (CVA) (Kelly Gibson)   . Dysarthria, post-stroke   . Gait disturbance, post-stroke   . Acute CVA (cerebrovascular accident) (Kelly Gibson) 12/05/2017  . Bradycardia   . Essential hypertension   . Weakness 10/26/2017  . Cerebrovascular disease 10/18/2017  . COPD (chronic obstructive pulmonary disease) ([place]) 02/18/2015  . Loss of weight 03/15/2014  . Non compliance w medication regimen 06/17/2013  . Post-menopause bleeding 06/16/2013  . Bronchitis 05/04/2012  . Tobacco user 05/04/2012  . Ganglion cyst 03/09/2012  . Anxiety 03/09/2012  . Essential hypertension, benign 12/17/2011  . Obesity, unspecified 12/17/2011  . Hyperlipidemia 12/17/2011  . Depression 12/17/2011  . Diabetes mellitus type II, uncontrolled (Kelly Gibson) 12/14/2011    Past Medical History:  Past Medical History:  Diagnosis Date  . COPD (chronic obstructive pulmonary disease) (Kelly Gibson)   . Depression   . Diabetes mellitus   . Hyperlipidemia   . Hypertension    Past Surgical History:  Past Surgical History:  Procedure Laterality Date  . CESAREAN SECTION     x 3  . TUMOR REMOVAL     left shoulder    Assessment & Plan Clinical Impression: Patient is a 57 year old right-handed female with history of diabetes mellitus, COPD/tobacco abuse, hypertension, hyperlipidemia as well as recent CVA in March with mild right-sided residual weakness maintained on aspirin 81 mg daily as well as  loop recorder. Per chart review she lives with her spouse. Two-level home with bed and bath upstairs. She was independent driving performing all ADLs prior to admission. Her husband works during the day they have 3 daughters in the area. Presented 12/05/2017 with increasing right-sided weakness slurred speech and dizziness as well as reported weight loss. Loop recorder did show 3 to 4-second pauses initial heart rate in the 30s. MRI of the brain showed small acute lacunar infarction along the left frontal horn periventricular Kelly matter. Small subacute lacunar infarction in the left posterior corona radiata. Numerous chronic lacunar infarcts in the deep gray matter nucllei, brainstem and cerebral Kelly matter including the corpus callosum. CT angiogram of the head neck negative for large vessel occlusion. Positive for widespread atherosclerosis. Echocardiogram with ejection fraction of 60% no wall motion abnormalities with grade 1 diastolic dysfunction. Neurology follow-up aspirin 325 mg daily for CVA prophylaxis. Subcutaneous Lovenox for DVT prophylaxis. Dysphasia #2 thin liquid diet. CT of the chestfor evaluation of weight loss that showed nonspecific mildly enlarged mediastinal bilateral hilar lymph nodes as well as 5 mm right upper lobe nodule with recommendations for noncontrast chest CT 12 months  Patient transferred to CIR on 12/09/2017 .   Patient currently requires max with mobility secondary to muscle weakness and muscle paralysis, unbalanced muscle activation and motor apraxia, decreased midline orientation, decreased attention to right and decreased motor planning, decreased attention, decreased awareness, decreased problem solving, decreased safety awareness, decreased memory and delayed processing and decreased sitting balance, decreased standing balance, decreased postural control, hemiplegia and decreased balance strategies.  Prior to hospitalization, patient was independent  with  mobility and lived with   in a   home.  Home access is   .  Patient will benefit from skilled PT intervention to maximize safe functional mobility, minimize fall risk and decrease caregiver burden for planned discharge home with 24 hour assist.  Anticipate patient will benefit from follow up [facility] at discharge.     Skilled Therapeutic Intervention Pt received supine in bed and agreeable to PT. Supine>sit transfer with max assist and max cues for sequencing. PT instructed patient in PT Evaluation and initiated treatment intervention; see below for results. PT educated patient in [place], rehab potential, rehab goals, and discharge recommendations.  Pt returned to room and performed squat pivot transfer to bed with max assist. Sit>supine completed with max assist, and pt left supine in bed with call bell in reach and all needs met.      PT Evaluation Precautions/Restrictions   General   Vital SignsTherapy Vitals Pulse Rate: 85 Resp: 18 Patient Position (if appropriate): Lying Oxygen Therapy SpO2: 99 % O2 Device: Room Air Pain Pain Assessment Pain Scale: 0-10 Pain Score: 0-No pain Home Living/Prior Functioning Home Living Available Help at Discharge: Family Type of Home: House Home Access: Level entry Home Layout: Two level;Bed/bath upstairs;Able to live on main level with bedroom/bathroom Alternate Level Stairs-Number of Steps: 2 flights with a landing  Bathroom Shower/Tub: Tub/shower unit Additional Comments: pt's daughter reports family is looking into arranging for 24hr supervision for the summer   Lives With: Spouse Prior Function Level of Independence: Independent with basic ADLs;Independent with homemaking with ambulation;Independent with gait;Independent with transfers  Able to Take Stairs?: Yes Driving: Yes Vocation: Retired Leisure: Hobbies-yes (Comment) Comments: enjoys cooking, taking dog to dog park - has 2 dogs Vision/Perception    mild R inattention in functional  movement.  Cognition Orientation Level: Oriented to person;Oriented to place;Oriented to situation;Disoriented to time Selective Attention: Impaired Selective Attention Impairment: Verbal complex;Functional complex Awareness: Impaired Awareness Impairment: Emergent impairment;Anticipatory impairment Problem Solving: Impaired Safety/Judgment: Impaired Comments: mildly impulsive Sensation Sensation Light Touch: Impaired Detail Light Touch Impaired Details: Impaired RUE;Impaired RLE Proprioception: Impaired Detail Proprioception Impaired Details: Impaired RUE;Impaired LLE Coordination Gross Motor Movements are Fluid and Coordinated: No Fine Motor Movements are Fluid and Coordinated: No Coordination and Movement Description: dense R hemiplegia UE and LE  Motor  Motor Motor: Motor apraxia;Hemiplegia Motor - Skilled Clinical Observations: dense R hemiplegia. R LE apraxia.   Mobility Bed Mobility Bed Mobility: Rolling Right;Supine to Sit;Sit to Supine;Rolling Left Rolling Right: 4: Min assist Rolling Right Details: Verbal cues for precautions/safety;Manual facilitation for placement;Verbal cues for technique;Verbal cues for sequencing Rolling Left: 2: Max assist Rolling Left Details: Verbal cues for precautions/safety;Verbal cues for safe use of DME/AE;Manual facilitation for placement;Manual facilitation for weight shifting;Verbal cues for technique;Verbal cues for sequencing;Visual cues/gestures for precautions/safety;Visual cues/gestures for sequencing;Visual cues for safe use of DME/AE Supine to Sit: 2: Max assist Supine to Sit Details: Verbal cues for technique;Verbal cues for precautions/safety;Verbal cues for safe use of DME/AE;Verbal cues for sequencing;Visual cues/gestures for sequencing Sit to Supine: 2: Max assist Sit to Supine - Details: Verbal cues for technique;Verbal cues for precautions/safety;Verbal cues for safe use of DME/AE;Manual facilitation for weight shifting;Manual  facilitation for placement;Verbal cues for sequencing;Tactile cues for placement;Visual cues/gestures for precautions/safety Transfers Transfers: Yes Sit to Stand: 3: Mod assist Squat Pivot Transfers: 2: Max Risk manager Details: Verbal cues for technique;Verbal cues for precautions/safety;Verbal cues for safe use of DME/AE;Verbal cues for sequencing;Manual facilitation for placement;Visual cues/gestures for  precautions/safety;Manual facilitation for weight shifting Squat Pivot Transfer Details (indicate cue type and reason): rail inhall and parallel bars  Locomotion  Ambulation Ambulation: Yes Ambulation/Gait Assistance: 1: +2 Total assist Assistive device: Other (Comment)(rail in hall ) Ambulation/Gait Assistance Details: mild pushers syndrome with fatigue Gait Gait: Yes Gait Pattern: Step-to pattern;Right foot flat;Lateral trunk lean to right Stairs / Additional Locomotion Stairs: Yes Stairs Assistance: 2: Max Industrial/product designer Assistance Details: Verbal cues for technique;Visual cues/gestures for sequencing;Visual cues/gestures for precautions/safety;Visual cues for safe use of DME/AE;Verbal cues for sequencing;Verbal cues for precautions/safety;Verbal cues for safe use of DME/AE;Manual facilitation for placement;Manual facilitation for weight shifting Stair Management Technique: One rail Left Number of Stairs: 1 Height of Stairs: 3 Wheelchair Mobility Wheelchair Mobility: Yes Wheelchair Assistance: 3: Mod assist Wheelchair Assistance Details: Verbal cues for gait pattern;Verbal cues for technique;Verbal cues for sequencing;Visual cues/gestures for sequencing;Visual cues for safe use of DME/AE;Visual cues/gestures for precautions/safety;Manual facilitation for placement Wheelchair Propulsion: Left lower extremity Wheelchair Parts Management: Needs assistance Distance: 75  Trunk/Postural Assessment  Cervical Assessment Cervical Assessment: Exceptions to [facility] Thoracic  Assessment Thoracic Assessment: Exceptions to [facility] Lumbar Assessment Lumbar Assessment: Exceptions to [facility] Postural Control Postural Control: Deficits on evaluation Righting Reactions: absent  Protective Responses: delayed   Balance Static Sitting Balance Static Sitting - Level of Assistance: 4: Min assist Dynamic Sitting Balance Dynamic Sitting - Level of Assistance: 2: Max assist Sitting balance - Comments: max with reaching to her R side/ R foot Static Standing Balance Static Standing - Level of Assistance: 2: Max assist Extremity Assessment      RLE Assessment RLE Assessment: Exceptions to [facility] RLE Strength RLE Overall Strength Comments: dense hemiplegia. 1/5 hip extension 0/5 all other muscle groups with MMT. Pt able to bear weight through RLE in standing without knee collapse.  LLE Assessment LLE Assessment: Within Functional Limits   See Function Navigator for Current Functional Status.   Refer to Care Plan for Long Term Goals  Recommendations for other services: Therapeutic Recreation  Stress management  Discharge Criteria: Patient will be discharged from PT if patient refuses treatment 3 consecutive times without medical reason, if treatment goals not met, if there is a change in medical status, if patient makes no progress towards goals or if patient is discharged from hospital.  The above assessment, treatment plan, treatment alternatives and goals were discussed and mutually agreed upon: by patient  Lorie Phenix 12/10/2017, 11:06 AM

## 2017-12-10 NOTE — Progress Notes (Signed)
Retta Diones, RN  Rehab Admission Coordinator  Physical Medicine and Rehabilitation  PMR Pre-admission  Signed  Date of Service:  12/09/2017 1:26 PM       Related encounter: ED to Hosp-Admission (Discharged) from 12/05/2017 in [facility]      Signed            Show:Clear all [x] Manual[x] Template[x] Copied  Added by: [x] Retta Diones, RN   [] Hover for details   PMR Admission Coordinator Pre-Admission Assessment  Patient: Kelly Gibson is an 57 y.o., female MRN: [id number] DOB: [birth year] Height: 5\' 2"  (157.5 cm) Weight: 70.3 kg (155 lb)                                                                                                                                                  Insurance Information HMO:    PPO:       PCP:       IPA:       80/20:       OTHER:   PRIMARY:  [employer] [place] w/[employer]      Policy#: [id number]      Subscriber: Clement Husbands CM Name:        Phone#:       Fax#: [phone] Pre-Cert#: [id number]      Employer:  [employer] spouse works Benefits:  Phone #:  [phone]     Name: Phone call - [employer]. Date: 07/27/17     Deduct:  $0      Out of Pocket Max:  None      Life Max:  N/A CIR: Not covered      SNF:  Not covered Outpatient:       Co-Pay:   Home Health:        Co-Pay:   DME:       Co-Pay:   Providers:  Plan is for preventive services only.  Not inpatient rehab coverage.  Medicaid Application Date:      Case Manager:   Disability Application Date:      Case Worker:   Emergency Contact Information         Contact Information    Name Relation Home Work Mobile   Acree,[name] Spouse [phone]  [phone]     Current Medical History  Patient Admitting Diagnosis:  Left periventricular white matter left corona radiata infarcts causing right hemiparesis and dysarthria as well as dysphagia  History of Present Illness: a 57 year old right-handed female with history of diabetes  mellitus, COPD/tobacco abuse, hypertension, hyperlipidemia as well as recent CVA in March with mild right-sided residual weakness maintained on aspirin 81 mg daily as well as loop recorder. Per chart review she lives with her spouse. Two-level home with bed and bath upstairs. She was independent driving performing all ADLs prior to admission. Her husband works during the  day they have 3 daughters in the area. Presented 12/05/2017 with increasing right-sided weakness slurred speech and dizziness as well as reported weight loss. Loop recorder did show 3 to 4-second pauses initial heart rate in the 30s. MRI of the brain showed small acute lacunar infarction along the left frontal horn periventricular white matter. Small subacute lacunar infarction in the left posterior corona radiata. Numerous chronic lacunar infarcts in the deep gray matter nucllei, brainstem and cerebral white matter including the corpus callosum. CT angiogram of the head neck negative for large vessel occlusion. Positive for widespread atherosclerosis. Echocardiogram with ejection fraction of 60% no wall motion abnormalities with grade 1 diastolic dysfunction. Neurology follow-up aspirin 325 mg daily for CVA prophylaxis. Subcutaneous Lovenox for DVT prophylaxis. Dysphasia #2 thin liquid diet. CT of the chestfor evaluation of weight loss that showed nonspecific mildly enlarged mediastinal bilateral hilar lymph nodes as well as 5 mm right upper lobe nodule with recommendations for noncontrast chest CT 12 months... Physical and occupational therapy evaluations completed with recommendations of physical medicine rehab consult.   Total: 12=NIH  Past Medical History      Past Medical History:  Diagnosis Date  . COPD (chronic obstructive pulmonary disease) ([place])   . Depression   . Diabetes mellitus   . Hyperlipidemia   . Hypertension     Family History  family history includes Cancer in her father; Diabetes in her  mother; Heart disease in her father.  Prior Rehab/Hospitalizations: No previous rehab admissions  Has the patient had major surgery during 100 days prior to admission? No  Current Medications   Current Facility-Administered Medications:  .  acetaminophen (TYLENOL) tablet 650 mg, 650 mg, Oral, Q4H PRN, 650 mg at 12/08/17 1922 **OR** acetaminophen (TYLENOL) solution 650 mg, 650 mg, Per Tube, Q4H PRN **OR** acetaminophen (TYLENOL) suppository 650 mg, 650 mg, Rectal, Q4H PRN, Garba, Mohammad L, MD .  albuterol (PROVENTIL) (2.5 MG/3ML) 0.083% nebulizer solution 2.5 mg, 2.5 mg, Nebulization, Q6H PRN, Jonelle Sidle, Mohammad L, MD .  amLODipine (NORVASC) tablet 5 mg, 5 mg, Oral, Daily, Vann, Jessica U, DO, 5 mg at 12/09/17 1040 .  aspirin EC tablet 325 mg, 325 mg, Oral, Daily, Kerney Elbe, MD, 325 mg at 12/09/17 1040 .  enoxaparin (LOVENOX) injection 40 mg, 40 mg, Subcutaneous, Q24H, Garba, Mohammad L, MD, 40 mg at 12/08/17 2146 .  hydrALAZINE (APRESOLINE) injection 5 mg, 5 mg, Intravenous, Q8H PRN, Vann, Jessica U, DO .  insulin aspart (novoLOG) injection 0-9 Units, 0-9 Units, Subcutaneous, TID WC, Elwyn Reach, MD, Stopped at 12/07/17 1200 .  lisinopril (PRINIVIL,ZESTRIL) tablet 40 mg, 40 mg, Oral, Daily, Vann, Jessica U, DO, 40 mg at 12/09/17 1040 .  MEDLINE mouth rinse, 15 mL, Mouth Rinse, BID, Vann, Jessica U, DO, 15 mL at 12/08/17 1111 .  nicotine (NICODERM CQ - dosed in mg/24 hours) patch 21 mg, 21 mg, Transdermal, Daily, Garba, Mohammad L, MD .  rosuvastatin (CRESTOR) tablet 40 mg, 40 mg, Oral, q1800, Kerney Elbe, MD, 40 mg at 12/08/17 1727 .  senna-docusate (Senokot-S) tablet 1 tablet, 1 tablet, Oral, QHS PRN, Garba, Mohammad L, MD .  umeclidinium bromide (INCRUSE ELLIPTA) 62.5 MCG/INH 1 puff, 1 puff, Inhalation, Daily, Gala Romney L, MD, 1 puff at 12/09/17 1145  Patients Current Diet:       Diet Order           DIET DYS 2 Room service appropriate? Yes; Fluid consistency:  Thin  Diet effective now  Precautions / Restrictions Precautions Precautions: Fall Precaution Comments: bradycardia Restrictions Weight Bearing Restrictions: No   Has the patient had 2 or more falls or a fall with injury in the past year?Yes.  Patient reports 4 falls with no injury.  Prior Activity Level Community (5-7x/wk): Went out daily, was not working, was driving.  Home Assistive Devices / Equipment Home Assistive Devices/Equipment: Eyeglasses, CBG Meter Home Equipment: None  Prior Device Use: Indicate devices/aids used by the patient prior to current illness, exacerbation or injury? None  Prior Functional Level Prior Function Level of Independence: Independent Comments: spouse assisting with getting into/out of car  Self Care: Did the patient need help bathing, dressing, using the toilet or eating?  Independent  Indoor Mobility: Did the patient need assistance with walking from room to room (with or without device)? Independent  Stairs: Did the patient need assistance with internal or external stairs (with or without device)? Independent  Functional Cognition: Did the patient need help planning regular tasks such as shopping or remembering to take medications? Independent  Current Functional Level Cognition  Arousal/Alertness: Awake/alert Overall Cognitive Status: Impaired/Different from baseline Orientation Level: Oriented to person, Disoriented to time, Oriented to place, Oriented to situation Following Commands: Follows one step commands consistently General Comments: slower processing noted and with expressive difficulties Attention: Selective Selective Attention: Impaired Selective Attention Impairment: Verbal basic, Functional basic Memory: Impaired Memory Impairment: Storage deficit, Retrieval deficit, Decreased recall of new information Awareness: Impaired Awareness Impairment: Emergent impairment, Intellectual impairment Problem  Solving: Impaired Problem Solving Impairment: Verbal basic, Functional basic    Extremity Assessment (includes Sensation/Coordination)  Upper Extremity Assessment: RUE deficits/detail RUE Deficits / Details: flaccid RUE   Lower Extremity Assessment: RLE deficits/detail RLE Deficits / Details: flaccid RLE, no evidence of active movement RLE Sensation: decreased light touch RLE Coordination: (absent)    ADLs  Overall ADL's : Needs assistance/impaired Eating/Feeding: Set up, Sitting Eating/Feeding Details (indicate cue type and reason): supported sitting; assist to open containers Grooming: Set up, Sitting, Wash/dry face Grooming Details (indicate cue type and reason): supported sitting Upper Body Bathing: Sitting, Minimal assistance Upper Body Bathing Details (indicate cue type and reason): pt able to wash chest, abdomen, and RUE; assist to wash LUE  Lower Body Bathing: Moderate assistance, +2 for physical assistance, +2 for safety/equipment, Sit to/from stand Lower Body Bathing Details (indicate cue type and reason): assist to wash lower portion of LEs while seated; pt maintaining static standing with modA using LUE support on bedrail (BSC and pt positioned to face bed rail) while additional therapist washes buttocks  Upper Body Dressing : Sitting, Minimal assistance Upper Body Dressing Details (indicate cue type and reason): donning new gown  Lower Body Dressing: Moderate assistance, Sit to/from stand, +2 for physical assistance, +2 for safety/equipment Toilet Transfer: Moderate assistance, +2 for physical assistance, +2 for safety/equipment, Stand-pivot, BSC Toileting- Clothing Manipulation and Hygiene: Moderate assistance, +2 for physical assistance, +2 for safety/equipment, Sit to/from stand Functional mobility during ADLs: Moderate assistance, +2 for physical assistance, +2 for safety/equipment(for stand pivot transfer) General ADL Comments: pt completing bed mobility and working  on static sitting balance; pt able to maintain static sitting briefly without UE support and close minguard, overall reliant on LUE support on bedrail and/or minA from therapist; use of Stedy for transfer to recliner and setup for simple grooming ADLs and to eat lunch; completed PROM to RUE while seated in recliner     Mobility  Overal bed mobility: Needs Assistance Bed Mobility: Supine to Sit  Rolling: Min assist Sidelying to sit: Mod assist Supine to sit: Max assist General bed mobility comments: pt demonstrating some active movement in RLE today when attempting bed mobility, requiring assist to fully advance RLE over EOB; ModA to elevate trunk into sitting, pt using LUE and bedrail to assist with bringing trunk upright     Transfers  Overall transfer level: Needs assistance Equipment used: Ambulation equipment used Transfer via Lift Equipment: Stedy Transfers: Sit to/from Stand Sit to Stand: Mod assist Stand pivot transfers: Mod assist, +2 physical assistance, +2 safety/equipment General transfer comment: Patient able to perform sit <> stand with stedy using LUE and moderate assist. Patient initiated activity upon command.    Ambulation / Gait / Stairs / Office manager / Balance Dynamic Sitting Balance Sitting balance - Comments: close minguard for static sitting  Balance Overall balance assessment: Needs assistance Sitting-balance support: Feet supported, No upper extremity supported, Single extremity supported Sitting balance-Leahy Scale: Fair Sitting balance - Comments: close minguard for static sitting  Postural control: Right lateral lean Standing balance support: During functional activity, Single extremity supported Standing balance-Leahy Scale: Poor Standing balance comment: reliant on UE support/external support from therapist     Special needs/care consideration BiPAP/CPAP No CPM No Continuous Drip IV No Dialysis No      Life Vest  No Oxygen No Special Bed No Trach Size No Wound Vac (area) No      Skin No                            Bowel mgmt: Last BM 12/08/17 Bladder mgmt: External catheter Diabetic mgmt Yes, on insulin at home    Previous Home Environment Living Arrangements: Spouse/significant other  Lives With: Spouse Available Help at Discharge: Family Type of Home: House Home Layout: Two level, Bed/bath upstairs Alternate Level Stairs-Number of Steps: 2 flights with a landing  Home Access: Level entry Bathroom Shower/Tub: Chiropodist: Standard Home Care Services: No Additional Comments: pt's daughter reports family is looking into arranging for 24hr supervision for the summer   Discharge Living Setting Plans for Discharge Living Setting: Patient's home, House, Lives with (comment)(Lives with husband.) Type of Home at Discharge: House Discharge Home Layout: Two level, Bed/bath upstairs, Able to live on main level with bedroom/bathroom Alternate Level Stairs-Number of Steps: Flight Discharge Home Access: Stairs to enter [employer] of Steps: 1 step entry Does the patient have any problems obtaining your medications?: No  Social/Family/Support Systems Patient Roles: Spouse, Parent Contact Information: Marilu Rylander - spouse - [phone] Anticipated Caregiver: Husband, daughters and family Ability/Limitations of Caregiver: Husband works 7 am to 5 pm. Caregiver Availability: Other (Comment)(Husband aware of need for 24/7 care after discharge ) Discharge Plan Discussed with Primary Caregiver: Yes Is Caregiver In Agreement with Plan?: Yes Does Caregiver/Family have Issues with Lodging/Transportation while Pt is in Rehab?: No  Goals/Additional Needs Patient/Family Goal for Rehab: PT/OT/SLP min assist goals Expected length of stay: 18-21 days Cultural Considerations: None Dietary Needs: Dys 2, thin liquids Equipment Needs: TBD Pt/Family Agrees to Admission and  willing to participate: Yes Program Orientation Provided & Reviewed with Pt/Caregiver Including Roles  & Responsibilities: Yes  Decrease burden of Care through IP rehab admission: N/A  Possible need for SNF placement upon discharge: Not planned  Patient Condition: This patient's condition remains as documented in the consult dated 12/08/17, in which the Rehabilitation Physician determined and documented that  the patient's condition is appropriate for intensive rehabilitative care in an inpatient rehabilitation facility. Will admit to inpatient rehab today.  Preadmission Screen Completed By:  Retta Diones, 12/09/2017 1:26 PM ______________________________________________________________________   Discussed status with Dr. Letta Pate on 12/09/17 at 1339 and received telephone approval for admission today.  Admission Coordinator:  Retta Diones, time 1339/Date 12/09/17             Cosigned by: Charlett Blake, MD at 12/09/2017 2:09 PM  Revision History

## 2017-12-10 NOTE — Progress Notes (Signed)
Pt spouse brought pizza for pt dinner. Pt and and spouse educated on importance of following ordered dysphagia diet. Claude Manges, LPN

## 2017-12-10 NOTE — Progress Notes (Signed)
Physical Therapy Session Note  Patient Details  Name: Kelly Gibson MRN: [id number] Date of Birth: 05-08-1961  Today's Date: 12/10/2017 PT Individual Time: 1188-6773 PT Individual Time Calculation (min): 27 min   Skilled Therapeutic Interventions/Progress Updates:   Session focused on NMR to address postural control, reorientation to midline, and sit <> stands as method to trial for toileting and transfers. Attempted squat pivot to [facility] but difficulty with coordination of BLE during transition and strong push to the R, so determined use of Stedy as safer option and allows pt to practice sit <> stands (mod assist) and standing balance better during hygiene/clothing management. In standing, addressed postural control and verbal and tactile cues for equal weightbearing as pt tendency with pushing to the R but able to correct and then maintain position with min assist.   Therapy Documentation Precautions:  Precautions Precautions: Fall Precaution Comments: bradycardia Restrictions Weight Bearing Restrictions: No RUE Weight Bearing: Weight bearing as tolerated RLE Weight Bearing: Weight bearing as tolerated   Pain: Denies pain.    See Function Navigator for Current Functional Status.   Therapy/Group: Individual Therapy  [name], PT, DPT  12/10/2017, 3:04 PM

## 2017-12-10 NOTE — Progress Notes (Signed)
Kirsteins, Luanna Salk, MD  Physician  Physical Medicine and Rehabilitation  Consult Note  Signed  Date of Service:  12/08/2017 6:12 AM       Related encounter: ED to Hosp-Admission (Discharged) from 12/05/2017 in [facility]      Signed      Expand All Collapse All       Show:Clear all [x] Manual[x] Template[] Copied  Added by: [x] Angiulli, Lavon Paganini, PA-C[x] Kirsteins, Luanna Salk, MD   [] Hover for details        Physical Medicine and Rehabilitation Consult Reason for Consult: Right side weakness Referring Physician: [facility]   HPI: Kelly Gibson is a 57 y.o. right-handed female with history of COPD/tobacco abuse, hypertension, hyperlipidemia as well as recent CVA in March with mild right-sided residual weakness and maintained on aspirin 81 mg daily as well as loop recorder.  Per chart review patient lives with spouse.  Two-level home with bed and bath upstairs.  Patient was independent driving and performing all ADLs prior to admission.  Her husband does work during the day they have 3 daughters also in the area.  Presented 12/05/2017 with increasing right-sided weakness, slurred speech and dizziness.  Loop recorder did show 3 to 4-second pauses initial heart rate in the 30s.  MRI of the brain showed small acute lacunar infarction along the left frontal horn periventricular white matter.  Small subacute lacunar infarct in the left posterior corona radiata.  Numerous chronic lacunar infarcts in the deep gray matter nucllei, brainstem and cerebral white matter including the corpus callosum.  CT angiogram of head and neck negative for large vessel occlusion.  Positive for widespread atherosclerosis.  Echocardiogram with ejection fraction of 60% no wall motion abnormalities with grade 1 diastolic dysfunction.  Neurology follow-up patient currently on aspirin 325 mg daily for CVA prophylaxis.  Subcutaneous Lovenox for DVT prophylaxis.  Dysphasia #2 thin liquid diet.   Physical and occupational therapy evaluations completed with recommendations of physical medicine rehab consult.   Review of Systems  Constitutional: Negative for chills and fever.  HENT: Negative for hearing loss.   Eyes: Negative for blurred vision and double vision.  Respiratory: Positive for shortness of breath.   Cardiovascular: Positive for palpitations and leg swelling.  Gastrointestinal: Positive for constipation. Negative for nausea and vomiting.  Genitourinary: Negative for dysuria, flank pain and hematuria.  Musculoskeletal: Positive for myalgias.  Skin: Negative for rash.  Neurological: Positive for speech change and focal weakness.  All other systems reviewed and are negative.      Past Medical History:  Diagnosis Date  . COPD (chronic obstructive pulmonary disease) ([place])   . Depression   . Diabetes mellitus   . Hyperlipidemia   . Hypertension         Past Surgical History:  Procedure Laterality Date  . CESAREAN SECTION     x 3  . TUMOR REMOVAL     left shoulder        Family History  Problem Relation Age of Onset  . Diabetes Mother   . Heart disease Father   . Cancer Father    Social History:  reports that she has quit smoking. She has never used smokeless tobacco. She reports that she does not drink alcohol or use drugs. Allergies:      Allergies  Allergen Reactions  . Niacin And Related Rash         Medications Prior to Admission  Medication Sig Dispense Refill  . albuterol (PROVENTIL HFA;VENTOLIN HFA) 108 (90 Base)  MCG/ACT inhaler Inhale 2 puffs into the lungs every 6 (six) hours as needed for wheezing or shortness of breath. 1 Inhaler 1  . albuterol (PROVENTIL) (2.5 MG/3ML) 0.083% nebulizer solution Take 3 mLs (2.5 mg total) by nebulization every 6 (six) hours as needed for wheezing or shortness of breath. 30 vial 0  . amLODipine (NORVASC) 5 MG tablet Take 1 tablet (5 mg total) by mouth daily. 30 tablet 0  . aspirin EC 81 MG  tablet Take 81 mg by mouth daily.    Marland Kitchen glyBURIDE (DIABETA) 2.5 MG tablet One tab daily 30 tablet 6  . hydrochlorothiazide (HYDRODIURIL) 25 MG tablet Take 1 tablet (25 mg total) by mouth daily. 90 tablet 1  . Ibuprofen-diphenhydrAMINE Cit (ADVIL PM PO) Take 1 tablet by mouth as needed (for sleep).    . Insulin Glargine (TOUJEO SOLOSTAR) 300 UNIT/ML SOPN Inject 10 Units into the skin at bedtime. 3 pen 6  . Insulin Pen Needle (BD PEN NEEDLE MICRO U/F) 32G X 6 MM MISC Use as directed to inject Toujeo SQ QD. (Patient taking differently: No sig reported) 100 each 3  . lisinopril (PRINIVIL,ZESTRIL) 40 MG tablet Take 1 tablet (40 mg total) by mouth daily. 30 tablet 0  . rosuvastatin (CRESTOR) 10 MG tablet Take 1 tablet (10 mg total) by mouth daily. 90 tablet 1  . umeclidinium bromide (INCRUSE ELLIPTA) 62.5 MCG/INH AEPB Inhale 1 puff into the lungs daily. 30 each 6  . Glucose Blood (BLOOD GLUCOSE TEST STRIPS) STRP Please dispense per patient and insurance preference. Use as directed to monitor FSBS  3x daily. Dx: E11.65. 100 each 11  . Lancet Devices MISC Please dispense per patient and insurance preference. Use as directed to monitor FSBS  3x daily. Dx: E11.65. 100 each 11  . potassium chloride SA (K-DUR,KLOR-CON) 20 MEQ tablet Take 1 tablet (20 mEq total) by mouth daily. (Patient not taking: Reported on 11/03/2017) 90 tablet 1    Home: Home Living Family/patient expects to be discharged to:: Private residence Living Arrangements: Spouse/significant other Available Help at Discharge: Family Type of Home: House Home Access: Level entry Home Layout: Two level, Bed/bath upstairs Alternate Level Stairs-Number of Steps: 2 flights with a landing  Bathroom Shower/Tub: Chiropodist: [place]: None Additional Comments: pt's daughter reports family is looking into arranging for 24hr supervision for the summer   Lives With: Spouse  Functional History: Prior  Function Level of Independence: Independent Comments: spouse assisting with getting into/out of car Functional Status:  Mobility: Bed Mobility Overal bed mobility: Needs Assistance Bed Mobility: Supine to Sit Supine to sit: +2 for safety/equipment, Max assist General bed mobility comments: assist to advance RLE and scoot hips towards EOB; assist to bring trunk upright into sitting  Transfers Overall transfer level: Needs assistance Equipment used: 2 person hand held assist Transfers: Sit to/from Stand, Stand Pivot Transfers Sit to Stand: Mod assist, +2 safety/equipment Stand pivot transfers: Mod assist, +2 physical assistance, +2 safety/equipment General transfer comment: Pt completing stand pivot transfer EOB>BSC to the L with ModA+2, R knee blocked for increased stability with pt able to pivot on L foot to assist with transition; completed additional sit<>stand from [facility] using bedrail for LUE support   ADL: ADL Overall ADL's : Needs assistance/impaired Eating/Feeding: Minimal assistance Grooming: Minimal assistance, Sitting Upper Body Bathing: Sitting, Minimal assistance Upper Body Bathing Details (indicate cue type and reason): pt able to wash chest, abdomen, and RUE; assist to wash LUE  Lower Body Bathing:  Moderate assistance, +2 for physical assistance, +2 for safety/equipment, Sit to/from stand Lower Body Bathing Details (indicate cue type and reason): assist to wash lower portion of LEs while seated; pt maintaining static standing with modA using LUE support on bedrail (BSC and pt positioned to face bed rail) while additional therapist washes buttocks  Upper Body Dressing : Sitting, Minimal assistance Upper Body Dressing Details (indicate cue type and reason): donning new gown  Lower Body Dressing: Moderate assistance, Sit to/from stand, +2 for physical assistance, +2 for safety/equipment Toilet Transfer: Moderate assistance, +2 for physical assistance, +2 for safety/equipment,  Stand-pivot, BSC Toileting- Clothing Manipulation and Hygiene: Moderate assistance, +2 for physical assistance, +2 for safety/equipment, Sit to/from stand Functional mobility during ADLs: Moderate assistance, +2 for physical assistance, +2 for safety/equipment(for stand pivot transfer) General ADL Comments: pt noted to have soiled gown and bed linens due to pure wick not working; transferred to [facility] to wash up and don new gown prior to transferring to recliner             Cognition: Cognition Overall Cognitive Status: Impaired/Different from baseline Arousal/Alertness: Awake/alert Orientation Level: Oriented X4 Attention: Selective Selective Attention: Impaired Selective Attention Impairment: Verbal basic, Functional basic Memory: Impaired Memory Impairment: Storage deficit, Retrieval deficit, Decreased recall of new information Awareness: Impaired Awareness Impairment: Emergent impairment, Intellectual impairment Problem Solving: Impaired Problem Solving Impairment: Verbal basic, Functional basic Cognition Arousal/Alertness: Awake/alert Behavior During Therapy: WFL for tasks assessed/performed Overall Cognitive Status: Impaired/Different from baseline Area of Impairment: Problem solving Problem Solving: Slow processing General Comments: slower processing noted and with expressive difficulties   Blood pressure (!) 143/94, pulse 80, temperature 98.2 F (36.8 C), temperature source Oral, resp. rate 20, height 5\' 2"  (1.575 m), weight 70.3 kg (155 lb), last menstrual period 12/10/2012, SpO2 98 %. Physical Exam  Vitals reviewed. Constitutional: She is oriented to person, place, and time. She appears well-developed and well-nourished.  HENT:  Head: Normocephalic.  Eyes: EOM are normal.  Neck: Normal range of motion. Neck supple. No thyromegaly present.  Cardiovascular: Normal rate, regular rhythm and normal heart sounds. Exam reveals no friction rub.  No murmur heard. Cardiac  rate controlled 66 bpm  Respiratory: Effort normal and breath sounds normal. No respiratory distress.  GI: Soft. Bowel sounds are normal. She exhibits no distension.  Neurological: She is alert and oriented to person, place, and time. She exhibits abnormal muscle tone. Gait abnormal.  Speech is dysarthric but intelligible.  Follows simple commands  Motor strength is 0/5 in the right deltoid, bicep, tricep, grip, hip flexor, knee extensor, ankle dorsiflexor Sensation intact to light touch in the index through little finger on the right absent on the thumb, reduced sensation right small toe intact on the large toe to light touch.  Proprioception is intact  Patient has increased tone in the right pronators as well as in the pectoralis.   Skin: Skin is warm and dry.  Psychiatric: She has a normal mood and affect. Her speech is slurred. She is slowed.  Patient is severely dysarthric    LabResultsLast24Hours  Results for orders placed or performed during the hospital encounter of 12/05/17 (from the past 24 hour(s))  CBC     Status: None   Collection Time: 12/07/17  7:17 AM  Result Value Ref Range   WBC 4.6 4.0 - 10.5 K/uL   RBC 4.47 3.87 - 5.11 MIL/uL   Hemoglobin 12.5 12.0 - 15.0 g/dL   HCT 36.9 36.0 - 46.0 %   MCV  82.6 78.0 - 100.0 fL   MCH 28.0 26.0 - 34.0 pg   MCHC 33.9 30.0 - 36.0 g/dL   RDW 13.6 11.5 - 15.5 %   Platelets 388 150 - 400 K/uL  Basic metabolic panel     Status: Abnormal   Collection Time: 12/07/17  7:17 AM  Result Value Ref Range   Sodium 141 135 - 145 mmol/L   Potassium 3.1 (L) 3.5 - 5.1 mmol/L   Chloride 106 101 - 111 mmol/L   CO2 22 22 - 32 mmol/L   Glucose, Bld 86 65 - 99 mg/dL   BUN 11 6 - 20 mg/dL   Creatinine, Ser 0.80 0.44 - 1.00 mg/dL   Calcium 9.2 8.9 - 10.3 mg/dL   GFR calc non Af Amer >60 >60 mL/min   GFR calc Af Amer >60 >60 mL/min   Anion gap 13 5 - 15  Magnesium     Status: None   Collection Time: 12/07/17  7:17 AM    Result Value Ref Range   Magnesium 1.7 1.7 - 2.4 mg/dL  Glucose, capillary     Status: None   Collection Time: 12/07/17 12:17 PM  Result Value Ref Range   Glucose-Capillary 66 65 - 99 mg/dL   Comment 1 Notify RN    Comment 2 Document in Chart   Glucose, capillary     Status: Abnormal   Collection Time: 12/07/17  4:36 PM  Result Value Ref Range   Glucose-Capillary 100 (H) 65 - 99 mg/dL   Comment 1 Notify RN    Comment 2 Document in Chart   Glucose, capillary     Status: None   Collection Time: 12/08/17 12:33 AM  Result Value Ref Range   Glucose-Capillary 90 65 - 99 mg/dL   Comment 1 Notify RN    Comment 2 Document in Chart   Glucose, capillary     Status: None   Collection Time: 12/08/17  6:06 AM  Result Value Ref Range   Glucose-Capillary 92 65 - 99 mg/dL   Comment 1 Notify RN    Comment 2 Document in Chart       ImagingResults(Last48hours)  Ct Head Wo Contrast  Result Date: 12/07/2017 CLINICAL DATA:  Stroke follow-up EXAM: CT HEAD WITHOUT CONTRAST TECHNIQUE: Contiguous axial images were obtained from the base of the skull through the vertex without intravenous contrast. COMPARISON:  12/05/2017 FINDINGS: Brain: Known acute infarct in the left basal ganglia and left genu of the corpus callosum, the latter very subtle by CT. There is a background of extensive chronic small vessel ischemic injury in the deep gray nuclei and cerebral white matter. Atrophy greatest in the frontal lobes. No hemorrhage, hydrocephalus, or evidence of interval infarct. Vascular: Atherosclerotic calcification.  No hyperdense vessel Skull: No acute or aggressive finding Sinuses/Orbits: Negative IMPRESSION: 1. Known acute small-vessel infarcts. No hemorrhage or detectable progression when compared to brain MRI yesterday. 2. Advanced chronic small vessel disease. Electronically Signed   By: Monte Fantasia M.D.   On: 12/07/2017 10:15      Assessment/Plan: Diagnosis: Left  periventricular white matter left corona radiata infarcts causing right hemiparesis and dysarthria as well as dysphagia 1. Does the need for close, 24 hr/day medical supervision in concert with the patient's rehab needs make it unreasonable for this patient to be served in a less intensive setting? Yes 2. Co-Morbidities requiring supervision/potential complications: COPD, hypertension, recent weight loss 3. Due to bladder management, bowel management, safety, skin/wound care, disease management, medication administration  and patient education, does the patient require 24 hr/day rehab nursing? Yes 4. Does the patient require coordinated care of a physician, rehab nurse, PT (1-2 hrs/day, 5 days/week), OT (1-2 hrs/day, 5 days/week) and SLP (.5-1 hrs/day, 5 days/week) to address physical and functional deficits in the context of the above medical diagnosis(es)? Yes Addressing deficits in the following areas: balance, endurance, locomotion, strength, transferring, bowel/bladder control, bathing, dressing, feeding, grooming, toileting, cognition, speech, swallowing and psychosocial support 5. Can the patient actively participate in an intensive therapy program of at least 3 hrs of therapy per day at least 5 days per week? Yes 6. The potential for patient to make measurable gains while on inpatient rehab is excellent 7. Anticipated functional outcomes upon discharge from inpatient rehab are min assist  with PT, min assist with OT, min assist with SLP. 8. Estimated rehab length of stay to reach the above functional goals is: 18 to 21 days 9. Anticipated D/C setting: Home 10. Anticipated post D/C treatments: [place] therapy 11. Overall Rehab/Functional Prognosis: excellent  RECOMMENDATIONS: This patient's condition is appropriate for continued rehabilitative care in the following setting: CIR Patient has agreed to participate in recommended program. Yes Note that insurance prior authorization may be required for  reimbursement for recommended care.  Comment: Patient has work-up for lymphadenopathy, mediastinal adenopathy and weight loss, discussed with Dr Werner Lean M.D. [facility] FAAPM&R (Sports Med, Neuromuscular Med) Diplomate Am Board of Electrodiagnostic Med  Elizabeth Sauer 12/08/2017          Revision History                        Routing History

## 2017-12-10 NOTE — Progress Notes (Signed)
Social Work  Social Work Assessment and Plan  Patient Details  Name: Kelly Gibson MRN: [id number] Date of Birth: 06/02/1961  Today's Date: 12/10/2017  Problem List:  Patient Active Problem List   Diagnosis Date Noted  . CVA (cerebral vascular accident) ([place]) 12/09/2017  . Hemiparesis affecting right side as late effect of cerebrovascular accident (CVA) ([place])   . Dysarthria, post-stroke   . Gait disturbance, post-stroke   . Acute CVA (cerebrovascular accident) ([place]) 12/05/2017  . Bradycardia   . Essential hypertension   . Weakness 10/26/2017  . Cerebrovascular disease 10/18/2017  . COPD (chronic obstructive pulmonary disease) ([place]) 02/18/2015  . Loss of weight 03/15/2014  . Non compliance w medication regimen 06/17/2013  . Post-menopause bleeding 06/16/2013  . Bronchitis 05/04/2012  . Tobacco user 05/04/2012  . Ganglion cyst 03/09/2012  . Anxiety 03/09/2012  . Essential hypertension, benign 12/17/2011  . Obesity, unspecified 12/17/2011  . Hyperlipidemia 12/17/2011  . Depression 12/17/2011  . Diabetes mellitus type II, uncontrolled ([place]) 12/14/2011   Past Medical History:  Past Medical History:  Diagnosis Date  . COPD (chronic obstructive pulmonary disease) ([place])   . Depression   . Diabetes mellitus   . Hyperlipidemia   . Hypertension    Past Surgical History:  Past Surgical History:  Procedure Laterality Date  . CESAREAN SECTION     x 3  . TUMOR REMOVAL     left shoulder   Social History:  reports that she has quit smoking. She has never used smokeless tobacco. She reports that she does not drink alcohol or use drugs.  Family / Support Systems Marital Status: Married Patient Roles: Spouse, Parent Spouse/Significant OtherNadara Mustard [phone]  [phone] Children: Three daughter-s one in [place] and two in [place] Other Supports: Friends and church members Anticipated Caregiver: Husband and daughter's Ability/Limitations of Caregiver: husband  works M-F 7a-5p Careers adviser: Other (Comment)(May need to arrange 24 hr care await therapy evaluations) Family Dynamics: Close knit with daughter's and family. All pull together when one of them is in need. They will try their best to do what they can for pt. She is always the one to priovide and assist others, so the roles have reversed.  Social History Preferred language: English Religion:  Cultural Background: No issues Education: High School Read: Yes Write: Yes Employment Status: Unemployed Freight forwarder Issues: No issues Guardian/Conservator: None-according to MD pt is capable of making her own decisions while here. Will make sure hsband is included if any decision needs to be made here, per pt   Abuse/Neglect Abuse/Neglect Assessment Can Be Completed: Yes Physical Abuse: Denies Verbal Abuse: Denies Sexual Abuse: Denies Exploitation of patient/patient's resources: Denies Self-Neglect: Denies  Emotional Status Pt's affect, behavior adn adjustment status: Pt is motivated to do well she had a stroke in March and recvoered well but this one is worse. She wants to take care of herself like she always has she is not one to rely upon others. She is grateful for her family and their support. Recent Psychosocial Issues: recent admission on acute for first CVA 09/2017. had a loop recorder placed then, hopes the strokes will stop Pyschiatric History: No history deferred depression screen due to pt appears to be coping appropriately and adjusting the rehab unit. Do feel with her age she would benefit from seeing neuro-psych while here. Substance Abuse History: Quit tobacco no other issues  Patient / Family Perceptions, Expectations & Goals Pt/Family understanding of illness & functional limitations: Pt can explain her stroke  and deficits, her husband and she have spoken with the MD and feel they have a good understanding and know what the treatment plan is going  forward. Pt hopes she is done having strokes. Premorbid pt/family roles/activities: Wife, mother, friend, church member, etc Anticipated changes in roles/activities/participation: resume Pt/family expectations/goals: Pt states: ' I want to be able to take care of myself like I always have."  Husband states: " We will do what is needed I hope she is mobile and can be alone for a little bit."  [employer]: None Premorbid Home Care/DME Agencies: None Transportation available at discharge: [employer] referrals recommended: Neuropsychology, Support group (specify)  Discharge Planning Living Arrangements: Spouse/significant other Support Systems: Spouse/significant other, Children, Water engineer, Social worker community Type of Residence: Private residence Insurance Resources: Multimedia programmer (specify)(BCBS does not cover [employer]) Financial Resources: Secondary school teacher Screen Referred: No Living Expenses: Higher education careers adviser Management: Spouse, Patient Does the patient have any problems obtaining your medications?: No Home Management: Patient Patient/Family Preliminary Plans: Return home with husband who is there in the evenings and weekends, if she needs 24 hr care they will need to coem up with a plan for this. Await team's evaluations and work on a safe plan for her. Pt did well with her last stroke she is hopeful again but this one is worse. Sw Barriers to Discharge: Decreased caregiver support Sw Barriers to Discharge Comments: Does not have 24 hr care husband works M-F daytime Social Work Anticipated Follow Up Needs: HH/OP, Support Group  Clinical Impression Pleasant  female who is very motivated and wants to recover from this stroke like she did in March. She has supportive and involved family who will assist if they can when she is discharged. She would benefit from seeing neuro-psych while here will make referral.  Await team's evaluations.  Elease Hashimoto 12/10/2017, 10:42 AM

## 2017-12-10 NOTE — Care Management Note (Signed)
Inpatient Rehabilitation Center Individual Statement of Services  Patient Name:  Kelly Gibson  Date:  12/10/2017  Welcome to the [facility].  Our goal is to provide you with an individualized program based on your diagnosis and situation, designed to meet your specific needs.  With this comprehensive rehabilitation program, you will be expected to participate in at least 3 hours of rehabilitation therapies Monday-Friday, with modified therapy programming on the weekends.  Your rehabilitation program will include the following services:  Physical Therapy (PT), Occupational Therapy (OT), Speech Therapy (ST), 24 hour per day rehabilitation nursing, Therapeutic Recreaction (TR), Neuropsychology, Case Management (Social Worker), Rehabilitation Medicine, Nutrition Services and Pharmacy Services  Weekly team conferences will be held on Wednesday to discuss your progress.  Your Social Worker will talk with you frequently to get your input and to update you on team discussions.  Team conferences with you and your family in attendance may also be held.  Expected length of stay: 24-28 days Overall anticipated outcome: supervision-min assist  Depending on your progress and recovery, your program may change. Your Social Worker will coordinate services and will keep you informed of any changes. Your Social Worker's name and contact numbers are listed  below.  The following services may also be recommended but are not provided by the [name] will be made to provide these services after discharge if needed.  Arrangements include referral to agencies that provide these services.  Your insurance has been verified to be:  [place] Your primary doctor is:  [employer]  Pertinent information will be shared with your doctor and your insurance  company.  Social Worker:  Ovidio Kin, [place] or ([phone]  Information discussed with and copy given to patient by: Elease Hashimoto, 12/10/2017, 10:29 AM

## 2017-12-11 ENCOUNTER — Inpatient Hospital Stay (HOSPITAL_COMMUNITY): Payer: BLUE CROSS/BLUE SHIELD | Admitting: Speech Pathology

## 2017-12-11 ENCOUNTER — Inpatient Hospital Stay (HOSPITAL_COMMUNITY): Payer: BLUE CROSS/BLUE SHIELD | Admitting: Physical Therapy

## 2017-12-11 ENCOUNTER — Inpatient Hospital Stay (HOSPITAL_COMMUNITY): Payer: BLUE CROSS/BLUE SHIELD | Admitting: Occupational Therapy

## 2017-12-11 LAB — GLUCOSE, CAPILLARY
GLUCOSE-CAPILLARY: 125 mg/dL — AB (ref 65–99)
GLUCOSE-CAPILLARY: 111 mg/dL — AB (ref 65–99)
GLUCOSE-CAPILLARY: 108 mg/dL — AB (ref 65–99)
GLUCOSE-CAPILLARY: 185 mg/dL — AB (ref 65–99)

## 2017-12-11 NOTE — Progress Notes (Signed)
Physical Therapy Session Note  Patient Details  Name: Kelly Gibson MRN: [id number] Date of Birth: 20-Feb-1961  Today's Date: 12/11/2017 PT Individual Time: 0820-0920 PT Individual Time Calculation (min): 60 min   Short Term Goals: Week 1:  PT Short Term Goal 1 (Week 1): Pt will perform bed mobiliy with mod assist  PT Short Term Goal 2 (Week 1): Pt will maintian sitting balance with min assist  PT Short Term Goal 3 (Week 1): Pt will ambulate 64f with max assist of 1.  PT Short Term Goal 4 (Week 1): Pt will perform bed<>WC trasnfers with mod assist  PT Short Term Goal 5 (Week 1): pt will propell WC 1051fwith supervision assist   Skilled Therapeutic Interventions/Progress Updates:  Pt received supine in bed and agreeable to PT. Supine>sit transfer with max asist and mod cues for improved upright posture and technique. s   Squat pivot [facility] and moderate cues for set, and sequencing. Pt transported to day room. [facility]at [facility]. Nustep reciprocal movement training with BLE 5 min +3 min with mod assist to activate RLE extension throughout. Maintained on level 1 throughout.    Standing balance/ forced use of RLE  Through Stepping LLE x 20 and mod assist from PT to stabilize the RLE; LUE support on rail. Stepping the RLE with max assist for weight shift and breaking extensor tone in the RLE.  RUE NMR. Shoulder flexion/extension on forearm roller and UE ranger 2x 30 wth AAROM from PT. Mild activation noted with flexion  Patient returned to room and [facility] reach and all needs met.     Therapy Documentation Precautions:  Precautions Precautions: Fall Precaution Comments: bradycardia Restrictions Weight Bearing Restrictions: No RUE Weight Bearing: Weight bearing as tolerated RLE Weight Bearing: Weight bearing as tolerated General:   Vital Signs: Therapy Vitals Temp: 97.8 F (36.6 C) Temp Source:  Oral Pulse Rate: 85 Resp: 18 BP: (!) 143/73 Patient Position (if appropriate): Lying Oxygen Therapy SpO2: 98 % O2 Device: Room Air Pain: Pain Assessment Pain Scale: 0-10 Pain Score: 0-No pain   See Function Navigator for Current Functional Status.   Therapy/Group: Individual Therapy  AuLorie Phenix/18/2019, 9:13 AM

## 2017-12-11 NOTE — Progress Notes (Signed)
Occupational Therapy Session Note  Patient Details  Name: Selma Mink MRN: [id number] Date of Birth: [birth year]  Today's Date: 12/11/2017 OT Group Time: 1105-1200 OT Group Time Calculation (min): 55 min  Skilled Therapeutic Interventions/Progress Updates:    Pt participated in therapeutic w/c level dance group with focus on Rt NMR, Rt attention, activity tolerance and social participation. Pt engaging in [place] dance exercises for Rt UE/LE with OT assist for hooking Lt LE under Rt. Pt holding hands with other group members and swaying in beat to music with cues for sustained attention. She requested [place] 5.0 and was able to name a few of the show tunes. Pt smiling and laughing with other group members. At end of tx, dtr escorted her back to room.   Therapy Documentation Precautions:  Precautions Precautions: Fall Precaution Comments: bradycardia Restrictions Weight Bearing Restrictions: No RUE Weight Bearing: Weight bearing as tolerated RLE Weight Bearing: Weight bearing as tolerated ADL: ADL ADL Comments: refer to functional navigator     See Function Navigator for Current Functional Status.   Therapy/Group: Group Therapy  Gershon Shorten A [place] Lebeau 12/11/2017, 12:30 PM

## 2017-12-11 NOTE — Progress Notes (Signed)
Speech Language Pathology Daily Session Note  Patient Details  Name: Kelly Gibson MRN: [id number] Date of Birth: 05/17/1961  Today's Date: 12/11/2017 SLP Individual Time: 1302-1400 SLP Individual Time Calculation (min): 58 min  Short Term Goals: Week 1: SLP Short Term Goal 1 (Week 1): Patient will consume current diet with minimal overt s/s of aspiration and supervision verbal cues for use of swallowing compensatory strategies.  SLP Short Term Goal 2 (Week 1): Patient will decrease oral holding and initiate a timely swallow with thin liquids in 50% of opportunities with Mod A multimodal cues.  SLP Short Term Goal 3 (Week 1): Patient will recall new, daily information with use of external aids with Mod A multimodal cues.  SLP Short Term Goal 4 (Week 1): Patient will demonstrate functional problem solving for basic and familiar tasks with Min A verbal cues.  SLP Short Term Goal 5 (Week 1): Patient will utilize speech intelligibility strategies at the phrase level with Min A verbal cues to achieve 75% of intelligibility.   Skilled Therapeutic Interventions:  Pt was seen for skilled ST targeting cognitive goals.  SLP facilitated the session with min assist question cues for recall of specific details from morning therapy appointments.  Therapist then demonstrated how to organize information into a memory notebook to maximize ease of access of information when using notebook in the future.  SLP also facilitated the session with money management tasks to address problem solving goals.   Pt needed mod assist verbal and written cues for working memory and task organization when counting money and making change.  Pt was returned to room and left in bed with bed alarm set and call bell within reach.  Continue per current plan of care.    Function:  Eating Eating                 Cognition Comprehension Comprehension assist level: Understands basic 90% of the time/cues < 10% of the time   Expression   Expression assist level: Expresses basic 50 - 74% of the time/requires cueing 25 - 49% of the time. Needs to repeat parts of sentences.  Social Interaction Social Interaction assist level: Interacts appropriately 90% of the time - Needs monitoring or encouragement for participation or interaction.  Problem Solving Problem solving assist level: Solves basic 50 - 74% of the time/requires cueing 25 - 49% of the time  Memory Memory assist level: Recognizes or recalls 50 - 74% of the time/requires cueing 25 - 49% of the time    Pain Pain Assessment Pain Scale: 0-10 Pain Score: 0-No pain  Therapy/Group: Individual Therapy  Kelly Gibson, Selinda Orion 12/11/2017, 2:03 PM

## 2017-12-12 ENCOUNTER — Inpatient Hospital Stay (HOSPITAL_COMMUNITY): Payer: BLUE CROSS/BLUE SHIELD

## 2017-12-12 LAB — GLUCOSE, CAPILLARY
GLUCOSE-CAPILLARY: 145 mg/dL — AB (ref 65–99)
Glucose-Capillary: 114 mg/dL — ABNORMAL HIGH (ref 65–99)
Glucose-Capillary: 164 mg/dL — ABNORMAL HIGH (ref 65–99)
Glucose-Capillary: 120 mg/dL — ABNORMAL HIGH (ref 65–99)

## 2017-12-12 NOTE — Progress Notes (Signed)
Physical Therapy Note  Patient Details  Name: Kelly Gibson MRN: [id number] Date of Birth: [birth year] Today's Date: 12/12/2017  0930-1030, 60 min individual tx Pain: none  Pt seated in w/c.  neuromuscular re-education via forced use, multimodal cues for RLE mass extension from passive flexed position, alternating reciprocal movements , using Kinetron at minimal resistance, sitting in w/c, x 15 cycles x 3. Pt needed physical assistance for 1st set of 15, but improved with visual feedback to progress to independent movement.   Reciprocal scooting in w/c, forward/backward with use of mirror and manual assistance, for wt shifting and core activation.  Seated activity to focus on abdominal activation for sitting balance:  trunk flexion without use of LUE, to retrieve a playing card and match to board in front of her, x 9.  100% accurate matching.  Use of cones and balls to elicit R knee extension by kicking, in sitting; with eventual emergence of R knee extension with tapping stimulation to R quad muscle.  Pt left resting in w/c with quick release belt applied, seat alarm on,  and all needs within reach.  See function navigator for current status.  Rickell Wiehe 12/12/2017, 8:20 AM

## 2017-12-12 NOTE — Plan of Care (Signed)
  Problem: Consults Goal: RH STROKE PATIENT EDUCATION Description See Patient Education module for education specifics  Outcome: Progressing   Problem: RH BLADDER ELIMINATION Goal: RH STG MANAGE BLADDER WITH ASSISTANCE Description STG Manage Bladder With Min Assistance  Outcome: Progressing   Problem: RH SAFETY Goal: RH STG ADHERE TO SAFETY PRECAUTIONS W/ASSISTANCE/DEVICE Description STG Adhere to Safety Precautions With Assistance/Device. With cues and reminders.  Outcome: Progressing Goal: RH STG DECREASED RISK OF FALL WITH ASSISTANCE Description STG Decreased Risk of Fall With Assistance. Cues and reminders.  Outcome: Progressing   Problem: RH KNOWLEDGE DEFICIT Goal: RH STG INCREASE KNOWLEDGE OF DIABETES Description Pt.'s husband will be able to demonstrate knowledge of how to use medications, CBG monitoring, diet with cues and reminders.  Outcome: Progressing Goal: RH STG INCREASE KNOWLEDGE OF HYPERTENSION Description Husband will be able to demonstrate knowledge of medications and diet with cues and reminders.  Outcome: Progressing Goal: RH STG INCREASE KNOWLEDGE OF DYSPHAGIA/FLUID INTAKE Description Husband will be able to demonstrate dysphagia management with diet cues and reminders.  Outcome: Progressing

## 2017-12-12 NOTE — Progress Notes (Signed)
Patient/family reported to nurse that they dislike the current diet. Spouse states that she is not eating it at all, therefore the family has been providing foods that are incompatible with the recommended diet. Nurse educated patient and family on the reasons for the diet and the risks of noncompliance.

## 2017-12-12 NOTE — Progress Notes (Signed)
Subjective/Complaints:  Patient feels well.  Has no complaints.  She is getting up to go to the restroom..  Objective: Vital Signs: Blood pressure 138/88, pulse 81, temperature 99.3 F (37.4 C), temperature source Oral, resp. rate 18, height 5\' 2"  (1.575 m), weight 148 lb 9.4 oz (67.4 kg), last menstrual period 12/10/2012, SpO2 100 %.  No acute distress. HEENT exam atraumatic, normocephalic Neck is supple Chest clear to auscultation Cardiac exam S1-S2 regular. Abdominal exam overweight, active bowel sounds, soft. Extremities without edema. Speech is slow. Assessment/Plan: 1. Functional deficits secondary to Right hemiplegia left subcortical infarcts  Medical Problem List and Plan: 1.Right hemiplegia with dysarthria and dysphagiasecondary to left periventricular white matter left corona radiata infarctions as well as history of left brain infarction March 2019 Rehab evals today 2. DVT Prophylaxis/Anticoagulation: Subcutaneous Lovenox. Monitor for any bleeding episodes 3. Pain Management:Tylenol as needed 4. Mood:Provide emotional support 5. Neuropsych: This patientiscapable of making decisions on herown behalf. 6. Skin/Wound Care:Routine skin checks 7. Fluids/Electrolytes/Nutrition:Routine in and outs with follow-up chemistries 8.Dysphagia. Dysphagia #2 thin liquids. Follow-up speech therapy 9.Hypertension. Norvasc 5 mg daily, lisinopril 40 mg daily Vitals:   12/11/17 1934 12/12/17 0538  BP: (!) 155/61 138/88  Pulse: 80 81  Resp: 18 18  Temp: 99.2 F (37.3 C) 99.3 F (37.4 C)  SpO2: 99% 100%  Have allowed permissive hypertension.  May need to start increasing antihypertensive medications. 10.Diabetes mellitus with peripheral neuropathy. Hemoglobin A1c 8.4. Sliding scale insulin. Check blood sugars before meals and at bedtime. Patient on DiaBeta 2.5 mg daily, insulin glargine 10 units nightly. Resume as needed CBG (last 3)  Recent Labs     12/11/17 1640 12/11/17 2137 12/12/17 0632  GLUCAP 108* 185* 114*  Blood sugars better controlled.  We will continue to monitor for now. 11.Hyperlipidemia. Crestor 12.COPD with tobacco abuse. NicoDerm patch. Provide counseling. Inhalers as directed. 13.5 mm right upper lobe nodule with nonspecific mildly enlarged mediastinal bilateral hilar lymph nodes per CT of the chest 12/08/2017. Recommendations are for noncontrast chest CT 12 months for evaluation  LOS (Days) 3 A FACE TO FACE EVALUATION WAS PERFORMED  Bruce H Swords 12/12/2017, 9:43 AM

## 2017-12-13 ENCOUNTER — Ambulatory Visit: Payer: BLUE CROSS/BLUE SHIELD | Admitting: Internal Medicine

## 2017-12-13 ENCOUNTER — Inpatient Hospital Stay (HOSPITAL_COMMUNITY): Payer: BLUE CROSS/BLUE SHIELD | Admitting: Occupational Therapy

## 2017-12-13 ENCOUNTER — Inpatient Hospital Stay (HOSPITAL_COMMUNITY): Payer: BLUE CROSS/BLUE SHIELD | Admitting: Speech Pathology

## 2017-12-13 ENCOUNTER — Inpatient Hospital Stay (HOSPITAL_COMMUNITY): Payer: BLUE CROSS/BLUE SHIELD | Admitting: Physical Therapy

## 2017-12-13 ENCOUNTER — Inpatient Hospital Stay (HOSPITAL_COMMUNITY): Payer: BLUE CROSS/BLUE SHIELD

## 2017-12-13 LAB — GLUCOSE, CAPILLARY
GLUCOSE-CAPILLARY: 112 mg/dL — AB (ref 65–99)
Glucose-Capillary: 127 mg/dL — ABNORMAL HIGH (ref 65–99)
Glucose-Capillary: 114 mg/dL — ABNORMAL HIGH (ref 65–99)

## 2017-12-13 NOTE — Plan of Care (Signed)
  Problem: Consults Goal: RH STROKE PATIENT EDUCATION Description See Patient Education module for education specifics  Outcome: Progressing   Problem: RH BLADDER ELIMINATION Goal: RH STG MANAGE BLADDER WITH ASSISTANCE Description STG Manage Bladder With Min Assistance  Outcome: Progressing   Problem: RH SAFETY Goal: RH STG ADHERE TO SAFETY PRECAUTIONS W/ASSISTANCE/DEVICE Description STG Adhere to Safety Precautions With Assistance/Device. With cues and reminders.  Outcome: Progressing Goal: RH STG DECREASED RISK OF FALL WITH ASSISTANCE Description STG Decreased Risk of Fall With Assistance. Cues and reminders.  Outcome: Progressing   Problem: RH KNOWLEDGE DEFICIT Goal: RH STG INCREASE KNOWLEDGE OF DIABETES Description Pt.'s husband will be able to demonstrate knowledge of how to use medications, CBG monitoring, diet with cues and reminders.  Outcome: Progressing Goal: RH STG INCREASE KNOWLEDGE OF HYPERTENSION Description Husband will be able to demonstrate knowledge of medications and diet with cues and reminders.  Outcome: Progressing Goal: RH STG INCREASE KNOWLEDGE OF DYSPHAGIA/FLUID INTAKE Description Husband will be able to demonstrate dysphagia management with diet cues and reminders.  Outcome: Progressing

## 2017-12-13 NOTE — Progress Notes (Signed)
Speech Language Pathology Daily Session Note  Patient Details  Name: Kelly Gibson MRN: [id number] Date of Birth: 02-03-1961  Today's Date: 12/13/2017 SLP Individual Time: 0725-0825 SLP Individual Time Calculation (min): 60 min  Short Term Goals: Week 1: SLP Short Term Goal 1 (Week 1): Patient will consume current diet with minimal overt s/s of aspiration and supervision verbal cues for use of swallowing compensatory strategies.  SLP Short Term Goal 2 (Week 1): Patient will decrease oral holding and initiate a timely swallow with thin liquids in 50% of opportunities with Mod A multimodal cues.  SLP Short Term Goal 3 (Week 1): Patient will recall new, daily information with use of external aids with Mod A multimodal cues.  SLP Short Term Goal 4 (Week 1): Patient will demonstrate functional problem solving for basic and familiar tasks with Min A verbal cues.  SLP Short Term Goal 5 (Week 1): Patient will utilize speech intelligibility strategies at the phrase level with Min A verbal cues to achieve 75% of intelligibility.   Skilled Therapeutic Interventions: Skilled treatment session focused on dysphagia and speech goals. SLP facilitated session by providing skilled observation with lunch meal of Dys. 2 textures with thin liquids. Patient consumed diet without overt s/s of aspiration with supervision verbal cues for use of swallowing compensatory strategies. Patient also consumed trials of thin liquids with prolonged but efficient mastication and mild oral residue that patient independently cleared with a liquid wash. Recommend trial tray prior to upgrade. Patient also participated in a verbal description task at the phrase and sentence level and required Min-Mod  A verbal cues for use of speech intelligibility strategies to achieve ~80% intelligibility. Patient also required Max A verbal cues for use of external aids to recall speech strategies. Patient left upright in wheelchair with quick release  belt in place and family present. Continue with current plan of care.      Function:  Eating Eating   Modified Consistency Diet: Yes Eating Assist Level: Set up assist for;Supervision or verbal cues;More than reasonable amount of time   Eating Set Up Assist For: Opening containers       Cognition Comprehension Comprehension assist level: Understands basic 90% of the time/cues < 10% of the time  Expression   Expression assist level: Expresses basic 50 - 74% of the time/requires cueing 25 - 49% of the time. Needs to repeat parts of sentences.  Social Interaction Social Interaction assist level: Interacts appropriately 90% of the time - Needs monitoring or encouragement for participation or interaction.  Problem Solving Problem solving assist level: Solves basic 50 - 74% of the time/requires cueing 25 - 49% of the time  Memory Memory assist level: Recognizes or recalls 50 - 74% of the time/requires cueing 25 - 49% of the time    Pain Pain Assessment Pain Scale: 0-10 Pain Score: 0-No pain  Therapy/Group: Individual Therapy  Lankford Gutzmer 12/13/2017, 10:05 AM

## 2017-12-13 NOTE — Progress Notes (Signed)
Occupational Therapy Session Note  Patient Details  Name: Kelly Gibson MRN: [id number] Date of Birth: 04/29/1961  Today's Date: 12/13/2017 OT Individual Time: 1100-1156 OT Individual Time Calculation (min): 56 min    Short Term Goals: Week 1:  OT Short Term Goal 1 (Week 1): Pt will be able to maintain static sitting EOB with S.  OT Short Term Goal 2 (Week 1): Pt will be able to sit at EOB with min A for UB self care with min A.  OT Short Term Goal 3 (Week 1): Pt will be able to transfer to [facility] with mod A of 1. OT Short Term Goal 4 (Week 1): Pt will be able to maintain static stand with mod A to allow caregiver to assist pt with clothing management.  OT Short Term Goal 5 (Week 1): Pt will be able to use L hand to support RUE during bed mobility and transfers.   Skilled Therapeutic Interventions/Progress Updates:    Pt received sitting up in w/c agreeable to therapy via nodding with no faces/indications of pain throughout session. Session focused on static/dynamic balance, functional reaching, and ADL transfers. Pt completed stand pivot transfer to mat with mod A. Pt completed functional reaching with L UE across midline, with max manual facilitation provided at paretic R UE for weightbearing. Pt was able to maintain sitting balance ~50% of the time, with frequent LOB. Vc and gestures provided for B LE positioning to maximize BOS for balance. Pt then completed reaching forward with no UE support on mat, with several LOB toward the R and mod A provided to recover. Tactile cues provided for upright posture throughout session. Pt tolerated passive pec stretch to promote upright posture and cervical/thoracic extension. Pt sat with a posterior pelvic tilt throughout session to compensate for poor sitting balance, with several reaches encouraged overhead to promote extension/coming into neutral pelvic alignment. Pt transferred back to w/c with mod A and was transported back to room. QRB was donned and  all needs met.   Therapy Documentation Precautions:  Precautions Precautions: Fall Precaution Comments: bradycardia Restrictions Weight Bearing Restrictions: No RUE Weight Bearing: Weight bearing as tolerated RLE Weight Bearing: Weight bearing as tolerated   Pain: Pain Assessment Pain Scale: Faces Faces Pain Scale: No hurt ADL: ADL ADL Comments: refer to functional navigator  See Function Navigator for Current Functional Status.   Therapy/Group: Individual Therapy  Curtis Sites 12/13/2017, 11:59 AM

## 2017-12-13 NOTE — Progress Notes (Signed)
Physical Therapy Session Note  Patient Details  Name: Kelly Gibson MRN: [id number] Date of Birth: 12/29/1960  Today's Date: 12/13/2017 PT Individual Time: 1962-2297 PT Individual Time Calculation (min): 55 min   Short Term Goals: Week 1:  PT Short Term Goal 1 (Week 1): Pt will perform bed mobiliy with mod assist  PT Short Term Goal 2 (Week 1): Pt will maintian sitting balance with min assist  PT Short Term Goal 3 (Week 1): Pt will ambulate 62ft with max assist of 1.  PT Short Term Goal 4 (Week 1): Pt will perform bed<>WC trasnfers with mod assist  PT Short Term Goal 5 (Week 1): pt will propell WC 173ft with supervision assist   Skilled Therapeutic Interventions/Progress Updates:  Pt received in w/c & agreeable to tx. Pt with food & drink in front of her and no supervision - educated her on need for full staff supervision when consuming all food/drink. Transported pt to gym via w/c total assist for time management. Pt completes w/c>mat table transfer via squat pivot with mod assist with cuing for head/hips relationship and technique of transfer. Pt transfers sit>supine with min assist for RLE. Pt completes BLE bridging with instructional cuing for technique, pt with improving glute/hip activation as task progressed. Pt progressed to RLE single leg bridging with good hip extension, but unable to fully extend. Pt requires mod assist to roll prone as pt unable to manage RUE. Attempted to assume quadruped position but pt with great difficulty even wit max assist. In sidelying therapist provides max assist for placement of RUE to assist with pushing R sidelying>sitting but pt requires max assist 2/2 LOB. Therapist provides cuing for lateral lean and assistance with advancing R hip forwards to scoot to edge of mat table. Pt completes mat>w/c on L with mod assist with improving ability to lift buttocks and pivot. Pt transferred to tall kneeling on edge of mat table with max assist; pt able to maintain  tall kneeling with BUE support and mod assist for balance and upright positioning for ~1-2 minutes at a time for 2 trials before requiring seated rest break. At end of session pt left sitting in w/c in room with quick release belt donned & all needs within reach.  Therapy Documentation Precautions:  Precautions Precautions: Fall Precaution Comments: bradycardia Restrictions Weight Bearing Restrictions: No RUE Weight Bearing: Weight bearing as tolerated RLE Weight Bearing: Weight bearing as tolerated    See Function Navigator for Current Functional Status.   Therapy/Group: Individual Therapy  Waunita Schooner 12/13/2017, 10:56 AM

## 2017-12-13 NOTE — Progress Notes (Signed)
Occupational Therapy Session Note  Patient Details  Name: Kelly Gibson MRN: [id number] Date of Birth: May 14, 1961  Today's Date: 12/13/2017 OT Individual Time: 1415-1500 OT Individual Time Calculation (min): 45 min    Short Term Goals: Week 1:  OT Short Term Goal 1 (Week 1): Pt will be able to maintain static sitting EOB with S.  OT Short Term Goal 2 (Week 1): Pt will be able to sit at EOB with min A for UB self care with min A.  OT Short Term Goal 3 (Week 1): Pt will be able to transfer to [facility] with mod A of 1. OT Short Term Goal 4 (Week 1): Pt will be able to maintain static stand with mod A to allow caregiver to assist pt with clothing management.  OT Short Term Goal 5 (Week 1): Pt will be able to use L hand to support RUE during bed mobility and transfers.   Skilled Therapeutic Interventions/Progress Updates:    Pt greeted seated in wc and agreeable to OT treatment session focused on R UE NMR and NMES. Pt brought down to therapy gym in wc for time management.  Towel pushes with weight bearing for neuro re-ed, little activation noted in shoulder. Applied 1:1 NMES to CH1 supraspinatus and middle deltoid to help approximate shoulder joint, and Ch 2 wrist extensors.  Ratio 1:1 Rate 35 pps Waveform- Asymmetric Ramp 1.0 Pulse 300 CH1 Intensity- 16  Duration -  15  CH1 Intensity- 25 Duration -  15  Pt returned to room and left seated in wc with safety belt on and needs met.    Therapy Documentation Precautions:  Precautions Precautions: Fall Precaution Comments: bradycardia Restrictions Weight Bearing Restrictions: No RUE Weight Bearing: Weight bearing as tolerated RLE Weight Bearing: Weight bearing as tolerated Pain: Pain Assessment Pain Scale: Faces Faces Pain Scale: No hurt ADL: ADL ADL Comments: refer to functional navigator  See Function Navigator for Current Functional Status.   Therapy/Group: Individual Therapy  Valma Cava 12/13/2017, 2:34 PM

## 2017-12-13 NOTE — Progress Notes (Signed)
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 No complaints.   ROS: Patient denies fever, rash, sore throat, blurred vision, nausea, vomiting, diarrhea, cough, shortness of breath or chest pain, joint or back pain, headache, or mood change.   Objective: Vital Signs: Blood pressure 133/86, pulse 82, temperature 97.7 F (36.5 C), temperature source Oral, resp. rate 18, height 5\' 2"  (1.575 m), weight 67.4 kg (148 lb 9.4 oz), last menstrual period 12/10/2012, SpO2 100 %. No results found. Results for orders placed or performed during the hospital encounter of 12/09/17 (from the past 72 hour(s))  Glucose, capillary     Status: Abnormal   Collection Time: 12/10/17 11:31 AM  Result Value Ref Range   Glucose-Capillary 101 (H) 65 - 99 mg/dL  Glucose, capillary     Status: Abnormal   Collection Time: 12/10/17  5:17 PM  Result Value Ref Range   Glucose-Capillary 100 (H) 65 - 99 mg/dL  Glucose, capillary     Status: Abnormal   Collection Time: 12/10/17 10:02 PM  Result Value Ref Range   Glucose-Capillary 119 (H) 65 - 99 mg/dL  Glucose, capillary     Status: Abnormal   Collection Time: 12/11/17  6:37 AM  Result Value Ref Range   Glucose-Capillary 125 (H) 65 - 99 mg/dL  Glucose, capillary     Status: Abnormal   Collection Time: 12/11/17 12:11 PM  Result Value Ref Range   Glucose-Capillary 111 (H) 65 - 99 mg/dL  Glucose, capillary     Status: Abnormal   Collection Time: 12/11/17  4:40 PM  Result Value Ref Range   Glucose-Capillary 108 (H) 65 - 99 mg/dL  Glucose, capillary     Status: Abnormal   Collection Time: 12/11/17  9:37 PM  Result Value Ref Range   Glucose-Capillary 185 (H) 65 - 99 mg/dL  Glucose, capillary     Status: Abnormal   Collection Time: 12/12/17  6:32 AM  Result Value Ref Range   Glucose-Capillary 114 (H) 65 - 99 mg/dL  Glucose, capillary     Status: Abnormal   Collection Time: 12/12/17  1:14 PM  Result Value Ref Range   Glucose-Capillary 145  (H) 65 - 99 mg/dL  Glucose, capillary     Status: Abnormal   Collection Time: 12/12/17  5:15 PM  Result Value Ref Range   Glucose-Capillary 164 (H) 65 - 99 mg/dL  Glucose, capillary     Status: Abnormal   Collection Time: 12/12/17  9:51 PM  Result Value Ref Range   Glucose-Capillary 120 (H) 65 - 99 mg/dL  Glucose, capillary     Status: Abnormal   Collection Time: 12/13/17  6:48 AM  Result Value Ref Range   Glucose-Capillary 127 (H) 65 - 99 mg/dL     Constitutional: No distress . Vital signs reviewed. HEENT: EOMI, oral membranes moist Neck: supple Cardiovascular: RRR without murmur. No JVD    Respiratory: CTA Bilaterally without wheezes or rales. Normal effort    GI: BS +, non-tender, non-distended  Skin:   Intact Neuro: Alert/Oriented, Normal Sensory, Abnormal Motor 0/5 RUE adn RLE , 5/5 on left side and Dysarthric speech. Good insight and awareness. Musc/Skel:  Normal Psych: in good spirits   Assessment/Plan: 1. Functional deficits secondary to Right hemiplegia left subcortical infarcts which require 3+ hours per day of interdisciplinary therapy in a comprehensive inpatient rehab setting. Physiatrist is providing close team supervision and 24 hour management of active medical problems listed below. Physiatrist and rehab team continue to assess  barriers to discharge/monitor patient progress toward functional and medical goals. FIM: Function - Bathing Position: Wheelchair/chair at sink Body parts bathed by patient: Chest, Abdomen, Right upper leg, Left upper leg, Right arm Body parts bathed by helper: Left arm, Front perineal area, Buttocks, Right lower leg, Left lower leg, Back Assist Level: Touching or steadying assistance(Pt > 75%)  Function- Upper Body Dressing/Undressing What is the patient wearing?: Pull over shirt/dress Pull over shirt/dress - Perfomed by patient: Thread/unthread left sleeve, Put head through opening Pull over shirt/dress - Perfomed by helper:  Thread/unthread right sleeve, Pull shirt over trunk Function - Lower Body Dressing/Undressing What is the patient wearing?: Pants, Shoes, Ted Hose Position: Wheelchair/chair at [employer]- Performed by helper: Thread/unthread right pants leg, Thread/unthread left pants leg, Pull pants up/down Shoes - Performed by helper: Don/doff right shoe, Don/doff left shoe, Fasten right, Fasten left TED Hose - Performed by helper: Don/doff right TED hose, Don/doff left TED hose     Function - Toilet Transfers Toilet transfer assistive device: Drop arm commode Assist level to toilet: Maximal assist (Pt 25 - 49%/lift and lower) Assist level from toilet: Maximal assist (Pt 25 - 49%/lift and lower)  Function - Chair/bed transfer Chair/bed transfer method: Squat pivot Chair/bed transfer assist level: Moderate assist (Pt 50 - 74%/lift or lower) Chair/bed transfer assistive device: Armrests Chair/bed transfer details: Tactile cues for sequencing, Tactile cues for weight shifting, Tactile cues for posture, Tactile cues for placement, Verbal cues for sequencing, Verbal cues for technique, Visual cues/gestures for sequencing, Manual facilitation for placement, Manual facilitation for weight shifting  Function - Locomotion: Wheelchair Type: Manual Max wheelchair distance: 122ft  Assist Level: Touching or steadying assistance (Pt > 75%) Assist Level: Touching or steadying assistance (Pt > 75%) Wheel 150 feet activity did not occur: Safety/medical concerns Function - Locomotion: Ambulation Assistive device: Rail in hallway Max distance: 54ft  Assist level: 2 helpers Assist level: 2 helpers Walk 50 feet with 2 turns activity did not occur: Safety/medical concerns Walk 150 feet activity did not occur: Safety/medical concerns Walk 10 feet on uneven surfaces activity did not occur: Safety/medical concerns  Function - Comprehension Comprehension: Auditory Comprehension assist level: Understands basic 90% of  the time/cues < 10% of the time  Function - Expression Expression: Verbal Expression assist level: Expresses basic 50 - 74% of the time/requires cueing 25 - 49% of the time. Needs to repeat parts of sentences.  Function - Social Interaction Social Interaction assist level: Interacts appropriately 90% of the time - Needs monitoring or encouragement for participation or interaction.  Function - Problem Solving Problem solving assist level: Solves basic 50 - 74% of the time/requires cueing 25 - 49% of the time  Function - Memory Memory assist level: Recognizes or recalls 50 - 74% of the time/requires cueing 25 - 49% of the time Patient normally able to recall (first 3 days only): Current season, That he or she is in a hospital  Medical Problem List and Plan: 1.Right hemiplegia with dysarthria and dysphagiasecondary to left periventricular white matter left corona radiata infarctions as well as history of left brain infarction March 2019  -continue therapies 2. DVT Prophylaxis/Anticoagulation: Subcutaneous Lovenox. Monitor for any bleeding episodes 3. Pain Management:Tylenol as needed 4. Mood:Provide emotional support 5. Neuropsych: This patientiscapable of making decisions on herown behalf. 6. Skin/Wound Care:Routine skin checks 7. Fluids/Electrolytes/Nutrition: encourage PO  -BUN was trending up Friday---recheck in the AM 5/21 8.Dysphagia. Dysphagia #2 thin liquids. Follow-up speech therapy 9.Hypertension. Norvasc 5 mg daily, lisinopril  40 mg daily Vitals:   12/12/17 2154 12/13/17 0443  BP: (!) 170/87 133/86  Pulse: 78 82  Resp: 18 18  Temp: 97.7 F (36.5 C) 97.7 F (36.5 C)  SpO2: 99% 100%  improving control, no changes to regimen today 10.Diabetes mellitus with peripheral neuropathy. Hemoglobin A1c 8.4. Sliding scale insulin. Check blood sugars before meals and at bedtime. Patient on DiaBeta 2.5 mg daily, insulin glargine 10 units nightly. Resume as  needed CBG (last 3)  Recent Labs    12/12/17 1715 12/12/17 2151 12/13/17 0648  GLUCAP 164* 120* 127*  fair control 5/20 11.Hyperlipidemia. Crestor 12.COPD with tobacco abuse. NicoDerm patch. Provide counseling. Inhalers as directed. 13.5 mm right upper lobe nodule with nonspecific mildly enlarged mediastinal bilateral hilar lymph nodes per CT of the chest 12/08/2017. Recommendations are for noncontrast chest CT 12 months for evaluation  LOS (Days) 4 A FACE TO FACE EVALUATION WAS PERFORMED  Meredith Staggers 12/13/2017, 11:21 AM

## 2017-12-14 ENCOUNTER — Inpatient Hospital Stay (HOSPITAL_COMMUNITY): Payer: BLUE CROSS/BLUE SHIELD | Admitting: Physical Therapy

## 2017-12-14 ENCOUNTER — Inpatient Hospital Stay (HOSPITAL_COMMUNITY): Payer: BLUE CROSS/BLUE SHIELD | Admitting: Occupational Therapy

## 2017-12-14 ENCOUNTER — Ambulatory Visit (HOSPITAL_COMMUNITY): Payer: BLUE CROSS/BLUE SHIELD

## 2017-12-14 ENCOUNTER — Inpatient Hospital Stay (HOSPITAL_COMMUNITY): Payer: BLUE CROSS/BLUE SHIELD | Admitting: Speech Pathology

## 2017-12-14 ENCOUNTER — Encounter (HOSPITAL_COMMUNITY): Payer: BLUE CROSS/BLUE SHIELD | Admitting: Psychology

## 2017-12-14 LAB — BASIC METABOLIC PANEL
ANION GAP: 11 (ref 5–15)
BUN: 29 mg/dL — ABNORMAL HIGH (ref 6–20)
CALCIUM: 9.7 mg/dL (ref 8.9–10.3)
CO2: 26 mmol/L (ref 22–32)
Chloride: 106 mmol/L (ref 101–111)
Creatinine, Ser: 1 mg/dL (ref 0.44–1.00)
GFR calc Af Amer: >60 mL/min (ref >60)
GLUCOSE: 136 mg/dL — AB (ref 65–99)
POTASSIUM: 3.4 mmol/L — AB (ref 3.5–5.1)
SODIUM: 143 mmol/L (ref 135–145)

## 2017-12-14 LAB — GLUCOSE, CAPILLARY
GLUCOSE-CAPILLARY: 113 mg/dL — AB (ref 65–99)
GLUCOSE-CAPILLARY: 157 mg/dL — AB (ref 65–99)
Glucose-Capillary: 155 mg/dL — ABNORMAL HIGH (ref 65–99)

## 2017-12-14 NOTE — Progress Notes (Signed)
Subjective/Complaints:  Discussed rehab process with pt and husband  ROS: Patient denies fever, rash, sore throat, blurred vision, nausea, vomiting, diarrhea, cough, shortness of breath or chest pain, joint or back pain, headache, or mood change.   Objective: Vital Signs: Blood pressure (!) 122/94, pulse 81, temperature 99.2 F (37.3 C), temperature source Oral, resp. rate 18, height _0  (1.575 m), weight 67.4 kg (148 lb 9.4 oz), last menstrual period 12/10/2012, SpO2 98 %. No results found. Results for orders placed or performed during the hospital encounter of 12/09/17 (from the past 72 hour(s))  Glucose, capillary     Status: Abnormal   Collection Time: 12/11/17 12:11 PM  Result Value Ref Range   Glucose-Capillary 111 (H) 65 - 99 mg/dL  Glucose, capillary     Status: Abnormal   Collection Time: 12/11/17  4:40 PM  Result Value Ref Range   Glucose-Capillary 108 (H) 65 - 99 mg/dL  Glucose, capillary     Status: Abnormal   Collection Time: 12/11/17  9:37 PM  Result Value Ref Range   Glucose-Capillary 185 (H) 65 - 99 mg/dL  Glucose, capillary     Status: Abnormal   Collection Time: 12/12/17  6:32 AM  Result Value Ref Range   Glucose-Capillary 114 (H) 65 - 99 mg/dL  Glucose, capillary     Status: Abnormal   Collection Time: 12/12/17  1:14 PM  Result Value Ref Range   Glucose-Capillary 145 (H) 65 - 99 mg/dL  Glucose, capillary     Status: Abnormal   Collection Time: 12/12/17  5:15 PM  Result Value Ref Range   Glucose-Capillary 164 (H) 65 - 99 mg/dL  Glucose, capillary     Status: Abnormal   Collection Time: 12/12/17  9:51 PM  Result Value Ref Range   Glucose-Capillary 120 (H) 65 - 99 mg/dL  Glucose, capillary     Status: Abnormal   Collection Time: 12/13/17  6:48 AM  Result Value Ref Range   Glucose-Capillary 127 (H) 65 - 99 mg/dL  Glucose, capillary     Status: Abnormal   Collection Time: 12/13/17  4:54 PM  Result Value Ref Range   Glucose-Capillary 114 (H) 65 - 99 mg/dL   Glucose, capillary     Status: Abnormal   Collection Time: 12/13/17  9:53 PM  Result Value Ref Range   Glucose-Capillary 112 (H) 65 - 99 mg/dL  Glucose, capillary     Status: Abnormal   Collection Time: 12/14/17  6:56 AM  Result Value Ref Range   Glucose-Capillary 113 (H) 65 - 99 mg/dL  Basic metabolic panel     Status: Abnormal   Collection Time: 12/14/17  6:58 AM  Result Value Ref Range   Sodium 143 135 - 145 mmol/L   Potassium 3.4 (L) 3.5 - 5.1 mmol/L   Chloride 106 101 - 111 mmol/L   CO2 26 22 - 32 mmol/L   Glucose, Bld 136 (H) 65 - 99 mg/dL   BUN 29 (H) 6 - 20 mg/dL   Creatinine, Ser 1.00 0.44 - 1.00 mg/dL   Calcium 9.7 8.9 - 10.3 mg/dL   GFR calc non Af Amer >60 >60 mL/min   GFR calc Af Amer >60 >60 mL/min    Comment: (NOTE) The eGFR has been calculated using the CKD EPI equation. This calculation has not been validated in all clinical situations. eGFR's persistently <60 mL/min signify possible Chronic Kidney Disease.    Anion gap 11 5 - 15    Comment: Performed at [facility], [place] [address], [place], [place] [address]     Constitutional: No distress . Vital signs reviewed. HEENT: EOMI, oral membranes moist Neck: supple Cardiovascular: RRR without murmur. No JVD    Respiratory: CTA Bilaterally without wheezes or rales. Normal effort    GI: BS +, non-tender, non-distended  Skin:   Intact Neuro: Alert/Oriented, Normal Sensory, Abnormal Motor 0/5 RUE adn RLE , 5/5 on left side and Dysarthric speech. Good insight and awareness. Musc/Skel:  Normal Psych: in good spirits   Assessment/Plan: 1. Functional deficits secondary to Right hemiplegia left subcortical infarcts which require 3+ hours per day of interdisciplinary therapy in a comprehensive inpatient rehab setting. Physiatrist is providing close team supervision and 24 hour management of active medical problems listed below. Physiatrist and rehab team continue to assess barriers to discharge/monitor patient  progress toward functional and medical goals. FIM: Function - Bathing Position: Wheelchair/chair at sink Body parts bathed by patient: Chest, Abdomen, Right upper leg, Left upper leg, Right arm Body parts bathed by helper: Left arm, Front perineal area, Buttocks, Right lower leg, Left lower leg, Back Assist Level: Touching or steadying assistance(Pt > 75%)  Function- Upper Body Dressing/Undressing What is the patient wearing?: Pull over shirt/dress Pull over shirt/dress - Perfomed by patient: Thread/unthread left sleeve, Put head through opening Pull over shirt/dress - Perfomed by helper: Thread/unthread right sleeve, Pull shirt over trunk Function - Lower Body Dressing/Undressing What is the patient wearing?: Pants, Shoes, Ted Hose Position: Wheelchair/chair at [employer]- Performed by helper: Thread/unthread right pants leg, Thread/unthread left pants leg, Pull pants up/down Shoes - Performed by helper: Don/doff right shoe, Don/doff left shoe, Fasten right, Fasten left TED Hose - Performed by helper: Don/doff right TED hose, Don/doff left TED hose  Function - Toileting Toileting steps completed by patient: Adjust clothing prior to toileting(hospital gown) Toileting steps completed by helper: Adjust clothing prior to toileting, Performs perineal hygiene, Adjust clothing after toileting Toileting Assistive Devices: Grab bar or rail Assist level: Touching or steadying assistance (Pt.75%)  Function - Air cabin crew transfer assistive device: Drop arm commode, Mechanical lift Mechanical lift: Stedy Assist level to toilet: Total assist (Pt < 25%)(per Haylee Rivas, NT) Assist level from toilet: Total assist (Pt < 25%)  Function - Chair/bed transfer Chair/bed transfer method: Squat pivot Chair/bed transfer assist level: Moderate assist (Pt 50 - 74%/lift or lower) Chair/bed transfer assistive device: Armrests Chair/bed transfer details: Tactile cues for sequencing, Tactile cues  for weight shifting, Tactile cues for posture, Tactile cues for placement, Verbal cues for sequencing, Verbal cues for technique, Visual cues/gestures for sequencing, Manual facilitation for placement, Manual facilitation for weight shifting  Function - Locomotion: Wheelchair Type: Manual Max wheelchair distance: 173f  Assist Level: Touching or steadying assistance (Pt > 75%) Assist Level: Touching or steadying assistance (Pt > 75%) Wheel 150 feet activity did not occur: Safety/medical concerns Function - Locomotion: Ambulation Assistive device: Rail in hallway Max distance: 168f Assist level: 2 helpers Assist level: 2 helpers Walk 50 feet with 2 turns activity did not occur: Safety/medical concerns Walk 150 feet activity did not occur: Safety/medical concerns Walk 10 feet on uneven surfaces activity did not occur: Safety/medical concerns  Function - Comprehension Comprehension: Auditory Comprehension assist level: Understands basic 90% of the time/cues < 10% of the time  Function - Expression Expression: Verbal Expression assist level: Expresses basic 50 - 74% of the time/requires cueing 25 - 49% of the time. Needs to repeat parts of sentences.  Function -  Social Interaction Social Interaction assist level: Interacts appropriately 90% of the time - Needs monitoring or encouragement for participation or interaction.  Function - Problem Solving Problem solving assist level: Solves basic 50 - 74% of the time/requires cueing 25 - 49% of the time  Function - Memory Memory assist level: Recognizes or recalls 50 - 74% of the time/requires cueing 25 - 49% of the time Patient normally able to recall (first 3 days only): Current season, That he or she is in a hospital, Staff names and faces  Medical Problem List and Plan: 1.Right hemiplegia with dysarthria and dysphagiasecondary to left periventricular white matter left corona radiata infarctions as well as history of left brain  infarction March 2019  -continue therapies, team conf in am 2. DVT Prophylaxis/Anticoagulation: Subcutaneous Lovenox. Monitor for any bleeding episodes 3. Pain Management:Tylenol as needed 4. Mood:Provide emotional support 5. Neuropsych: This patientiscapable of making decisions on herown behalf. 6. Skin/Wound Care:Routine skin checks 7. Fluids/Electrolytes/Nutrition: encourage PO  -BUN was trending up Friday---recheck in the AM 5/21 8.Dysphagia. Dysphagia #2 thin liquids. Follow-up speech therapy 9.Hypertension. Norvasc 5 mg daily, lisinopril 40 mg daily Vitals:   12/13/17 2009 12/14/17 0502  BP: (!) 158/92 (!) 122/94  Pulse: 78 81  Resp: 18 18  Temp: 98.2 F (36.8 C) 99.2 F (37.3 C)  SpO2: 97% 98%  improving control, no changes to regimen 5/21 10.Diabetes mellitus with peripheral neuropathy. Hemoglobin A1c 8.4. Sliding scale insulin. Check blood sugars before meals and at bedtime. Patient on DiaBeta 2.5 mg daily, insulin glargine 10 units nightly. Resume as needed CBG (last 3)  Recent Labs    12/13/17 1654 12/13/17 2153 12/14/17 0656  GLUCAP 114* 112* 113*  excellent control 5/21 11.Hyperlipidemia. Crestor 12.COPD with tobacco abuse. NicoDerm patch. Provide counseling. Inhalers as directed. 13.5 mm right upper lobe nodule with nonspecific mildly enlarged mediastinal bilateral hilar lymph nodes per CT of the chest 12/08/2017. Recommendations are for noncontrast chest CT 12 months for evaluation 14.  HypoK mild likely nutritional add KCL 45mq LOS (Days) 5 A FACE TO FACE EVALUATION WAS PERFORMED  ACharlett Blake5/21/2019, 8:48 AM

## 2017-12-14 NOTE — Progress Notes (Signed)
Speech Language Pathology Daily Session Note  Patient Details  Name: Kelly Gibson MRN: [id number] Date of Birth: 03/08/1961  Today's Date: 12/14/2017 SLP Individual Time: 1200-1255 SLP Individual Time Calculation (min): 55 min  Short Term Goals: Week 1: SLP Short Term Goal 1 (Week 1): Patient will consume current diet with minimal overt s/s of aspiration and supervision verbal cues for use of swallowing compensatory strategies.  SLP Short Term Goal 2 (Week 1): Patient will decrease oral holding and initiate a timely swallow with thin liquids in 50% of opportunities with Mod A multimodal cues.  SLP Short Term Goal 3 (Week 1): Patient will recall new, daily information with use of external aids with Mod A multimodal cues.  SLP Short Term Goal 4 (Week 1): Patient will demonstrate functional problem solving for basic and familiar tasks with Min A verbal cues.  SLP Short Term Goal 5 (Week 1): Patient will utilize speech intelligibility strategies at the phrase level with Min A verbal cues to achieve 75% of intelligibility.   Skilled Therapeutic Interventions: Skilled treatment session focused on dysphagia and speech goals. SLP facilitated session by providing skilled observation with upgraded lunch meal of Dys. 3 textures with thin liquids. Patient demonstrated mildly prolonged but efficient mastication and required Min A verbal cues to self-monitor and correct intermittent right buccal pocketing. Patient without overt s/s of aspiration, therefore, recommend patient upgrade to Dys. 3 textures with continued full supervision for safety. Patient also demonstrated increased speech intelligibility at the phrase level this session and was overall 90% intelligible with a familiar communication partner but ~50% with an unfamiliar communication partner (who is [facility]). Patient left upright in wheelchair with quick release belt in place and all needs within reach. Continue with current plan of care.       Function:  Eating Eating   Modified Consistency Diet: Yes Eating Assist Level: Set up assist for;Supervision or verbal cues   Eating Set Up Assist For: Opening containers       Cognition Comprehension Comprehension assist level: Understands basic 90% of the time/cues < 10% of the time  Expression   Expression assist level: Expresses basic 75 - 89% of the time/requires cueing 10 - 24% of the time. Needs helper to occlude trach/needs to repeat words.  Social Interaction Social Interaction assist level: Interacts appropriately 90% of the time - Needs monitoring or encouragement for participation or interaction.  Problem Solving Problem solving assist level: Solves basic 50 - 74% of the time/requires cueing 25 - 49% of the time  Memory Memory assist level: Recognizes or recalls 50 - 74% of the time/requires cueing 25 - 49% of the time    Pain No/Denies Pain   Therapy/Group: Individual Therapy  Maureen Duesing 12/14/2017, 2:35 PM

## 2017-12-14 NOTE — Plan of Care (Signed)
  Problem: Consults Goal: RH STROKE PATIENT EDUCATION Description See Patient Education module for education specifics  Outcome: Progressing   Problem: RH BLADDER ELIMINATION Goal: RH STG MANAGE BLADDER WITH ASSISTANCE Description STG Manage Bladder With Min Assistance  Outcome: Progressing   Problem: RH SAFETY Goal: RH STG ADHERE TO SAFETY PRECAUTIONS W/ASSISTANCE/DEVICE Description STG Adhere to Safety Precautions With Assistance/Device. With cues and reminders.  Outcome: Progressing Goal: RH STG DECREASED RISK OF FALL WITH ASSISTANCE Description STG Decreased Risk of Fall With Assistance. Cues and reminders.  Outcome: Progressing   Problem: RH KNOWLEDGE DEFICIT Goal: RH STG INCREASE KNOWLEDGE OF DIABETES Description Pt.'s husband will be able to demonstrate knowledge of how to use medications, CBG monitoring, diet with cues and reminders.  Outcome: Progressing Goal: RH STG INCREASE KNOWLEDGE OF HYPERTENSION Description Husband will be able to demonstrate knowledge of medications and diet with cues and reminders.  Outcome: Progressing Goal: RH STG INCREASE KNOWLEDGE OF DYSPHAGIA/FLUID INTAKE Description Husband will be able to demonstrate dysphagia management with diet cues and reminders.  Outcome: Progressing  Husband educated on importance of dysphagia diet to prevent aspiration.

## 2017-12-14 NOTE — Progress Notes (Signed)
Physical Therapy Session Note  Patient Details  Name: Kelly Gibson MRN: [id number] Date of Birth: 10/02/1960  Today's Date: 12/14/2017 PT Individual Time: 8466-5993 PT Individual Time Calculation (min): 68 min   Short Term Goals: Week 1:  PT Short Term Goal 1 (Week 1): Pt will perform bed mobiliy with mod assist  PT Short Term Goal 2 (Week 1): Pt will maintian sitting balance with min assist  PT Short Term Goal 3 (Week 1): Pt will ambulate 67ft with max assist of 1.  PT Short Term Goal 4 (Week 1): Pt will perform bed<>WC trasnfers with mod assist  PT Short Term Goal 5 (Week 1): pt will propell WC 147ft with supervision assist   Skilled Therapeutic Interventions/Progress Updates:  Pt received asleep in w/c but easily awakened & agreeable to tx. Transported pt to gym via w/c total assist for time management. Attempted gait at rail in hallway but pt requires max assist for static standing balance and demonstrates extensor tone in RLE. Transitioned to stepping with LLE with LUE supported on rail in hallway and therapist providing max assist for balance, approximation at R knee to prevent buckling, and assistance for weight shifting in order to decrease tone in RLE. Pt unable to adequately shift weight and clear L foot from floor to participate in stepping therefore returned to sitting. Transitioned to standing frame for increased weight bearing through BLE to reduce tone in RLE. Pt utilized Geologist, engineering for visual feedback for midline orientation. Pt tolerated standing ~15 minutes while engaging in memory card matching game with task focusing on short term memory with pt initially requiring max cuing. Pt propelled w/c with L hemi technique with max cuing for technique and to attend to R side with min physical assist x 80 ft; pt with poor attention to obstacles on R. Returned to rail in hallway and attempted stepping task again. Pt with slightly reduced tone in RLE but unable to advance LLE and instead  begins to sit on therapist's leg. Pt safely returned to w/c with +2 assist and returned to room. Therapist provided home measurement sheet in room for pt's husband to complete. Pt left sitting in w/c in room with quick release belt & chair alarm donned, all needs within reach. Pt reports 8/10 fatigue at end of session.  Pt would benefit from continued education on head/hips relationship to assist with scooting forwards/backwards in w/c.  Therapy Documentation Precautions:  Precautions Precautions: Fall Precaution Comments: bradycardia Restrictions Weight Bearing Restrictions: No RUE Weight Bearing: Weight bearing as tolerated RLE Weight Bearing: Weight bearing as tolerated  Pain: Denies c/o pain.   See Function Navigator for Current Functional Status.   Therapy/Group: Individual Therapy  Waunita Schooner 12/14/2017, 4:05 PM

## 2017-12-14 NOTE — Consult Note (Signed)
Neuropsychological Consultation   Patient:   Kelly Gibson   DOB:   08/20/1960  MR Number:  [id number]  Location:  [facility] [address] [id number] [place] [address] Dept: [phone] WLS: [phone]           Date of Service:   12/14/2017  Start Time:   3:45 PM End Time:   4:45 PM  Provider/Observer:  Ilean Skill, Psy.D.       Clinical Neuropsychologist       Billing Code/Service: [phone] 4 Units  Chief Complaint:    Chasiti Waddington is a 57 year old female who has a history of diabetes, COPD/tobacco abuse, hypertension, hyperlipidemia as well as recent cerebrovascular accident in March.  The patient had mild right-sided residual weakness and had been maintained on aspirin daily as well as a loop recorder.  The patient presented on 12/05/2017 with increasing right-sided weakness, slurred speech and dizziness as well as recent weight loss.  MRI of the brain showed small acute lacunar infarction along the left frontal horn periventricular white matter.  Small subacute lacunar infarctions in the left posterior corona radiata.  There is also indications of numerous chronic lacunar infarcts in the deep gray matter, brainstem and cerebral white matter including the corpus callosum.  The patient was referred for physical and occupational therapy and admitted to the comprehensive inpatient rehabilitation unit.  Reason for Service:  The patient was referred for neuropsychological consult due to coping and adjustment issues following a second significant cerebrovascular accident.  She had a prior CVA in March.  The patient has significant expressive language deficits as well as right-sided motor deficits.  Below is the HPI for the current admission.  IOM:BTDHRCBUL Jonesis a 57 year old right-handed female with history of diabetes mellitus, COPD/tobacco abuse, hypertension, hyperlipidemia as well as recent  CVA in March with mild right-sided residual weakness maintained on aspirin 81 mg daily as well as loop recorder. Per chart review she lives with her spouse. Two-level home with bed and bath upstairs. She was independent driving performing all ADLs prior to admission. Her husband works during the day they have 3 daughters in the area. Presented 12/05/2017 with increasing right-sided weakness slurred speech and dizziness as well as reported weight loss. Loop recorder did show 3 to 4-second pauses initial heart rate in the 30s. MRI of the brain showed small acute lacunar infarction along the left frontal horn periventricular white matter. Small subacute lacunar infarction in the left posterior corona radiata. Numerous chronic lacunar infarcts in the deep gray matter nucllei, brainstem and cerebral white matter including the corpus callosum. CT angiogram of the head neck negative for large vessel occlusion. Positive for widespread atherosclerosis. Echocardiogram with ejection fraction of 60% no wall motion abnormalities with grade 1 diastolic dysfunction. Neurology follow-up aspirin 325 mg daily for CVA prophylaxis. Subcutaneous Lovenox for DVT prophylaxis. Dysphasia #2 thin liquid diet. CT of the chestfor evaluation of weight loss that showed nonspecific mildly enlarged mediastinal bilateral hilar lymph nodes as well as 5 mm right upper lobe nodule with recommendations for noncontrast chest CT 12 months... Physical and occupational therapy evaluations completed with recommendations of physical medicine rehab consult.  Current Status:  The patient currently has dysarthria and significant expressive language deficits.  Motor deficits on the right side of her body are also clear and apparent.  The patient had difficulty with word finding abilities but more so with related to pronunciation and articulation of words.  The  patient reported that her mood is fairly good and denied any significant depression  or anxiety but does acknowledge being frustrated.  The patient reports that she is motivated to continue with therapeutic interventions in hopes to improve like she did after her first stroke.  Behavioral Observation: Najat Olazabal  presents as a 57 y.o.-year-old Right African American Female who appeared her stated age. her dress was Appropriate and she was Well Groomed and her manners were Appropriate to the situation.  her participation was indicative of Appropriate and Redirectable behaviors.  There were any physical disabilities noted.  she displayed an appropriate level of cooperation and motivation.     Interactions:    Active Appropriate  Attention:   abnormal and attention span appeared shorter than expected for age  Memory:   abnormal; remote memory intact, recent memory impaired  Visuo-spatial:  not examined  Speech (Volume):  low  Speech:   garbled; slurred nonfluent aphasia  Thought Process:  Relevant  Though Content:  WNL; not suicidal and not homicidal  Orientation:   person, place, time/date and situation  Judgment:   Fair  Planning:   Fair  Affect:    Depressed  Mood:    Dysphoric  Insight:   Fair  Intelligence:   normal  Medical History:   Past Medical History:  Diagnosis Date  . COPD (chronic obstructive pulmonary disease) ([place])   . Depression   . Diabetes mellitus   . Hyperlipidemia   . Hypertension    Psychiatric History:  The patient does have a prior history of depression but reports that she has not been experiencing a significant worsening of her depression but does acknowledge frustration and worry about what is going to happen.  The patient reports that she recovered very well from her prior stroke and had little to very mild symptoms from that stroke after couple of months.  The patient reports that it is clear to her that she has had much more significant stroke this time.  Family Med/Psych History:  Family History  Problem Relation Age  of Onset  . Diabetes Mother   . Heart disease Father   . Cancer Father     Risk of Suicide/Violence: low the patient denies any suicidal or homicidal ideation.  Impression/DX:  Makinsey Pepitone is a 57 year old female who has a history of diabetes, COPD/tobacco abuse, hypertension, hyperlipidemia as well as recent cerebrovascular accident in March.  The patient had mild right-sided residual weakness and had been maintained on aspirin daily as well as a loop recorder.  The patient presented on 12/05/2017 with increasing right-sided weakness, slurred speech and dizziness as well as recent weight loss.  MRI of the brain showed small acute lacunar infarction along the left frontal horn periventricular white matter.  Small subacute lacunar infarctions in the left posterior corona radiata.  There is also indications of numerous chronic lacunar infarcts in the deep gray matter, brainstem and cerebral white matter including the corpus callosum.  The patient was referred for physical and occupational therapy and admitted to the comprehensive inpatient rehabilitation unit.  The patient currently has dysarthria and significant expressive language deficits.  Motor deficits on the right side of her body are also clear and apparent.  The patient had difficulty with word finding abilities but more so with related to pronunciation and articulation of words.  The patient reported that her mood is fairly good and denied any significant depression or anxiety but does acknowledge being frustrated.  The patient reports that she  is motivated to continue with therapeutic interventions in hopes to improve like she did after her first stroke.          Electronically Signed   _______________________ Ilean Skill, Psy.D.

## 2017-12-14 NOTE — Progress Notes (Signed)
Occupational Therapy Session Note  Patient Details  Name: Kelly Gibson MRN: [id number] Date of Birth: 1960-12-28  Today's Date: 12/14/2017 OT Individual Time: 1045-1200 OT Individual Time Calculation (min): 75 min    Short Term Goals: Week 1:  OT Short Term Goal 1 (Week 1): Pt will be able to maintain static sitting EOB with S.  OT Short Term Goal 2 (Week 1): Pt will be able to sit at EOB with min A for UB self care with min A.  OT Short Term Goal 3 (Week 1): Pt will be able to transfer to [facility] with mod A of 1. OT Short Term Goal 4 (Week 1): Pt will be able to maintain static stand with mod A to allow caregiver to assist pt with clothing management.  OT Short Term Goal 5 (Week 1): Pt will be able to use L hand to support RUE during bed mobility and transfers.   Skilled Therapeutic Interventions/Progress Updates:    Pt seen for OT session focusing on functional sitting balance, weight shifting/ weightbearing, and transfers. Pt in supine upon arrival, awake and agreeable to tx session. She transferred to EOB with assist to manage R LE and VCs for sequencing/technique using hospital bed functions.  Completed squat pivot transfers throughout session, overall min A transferring to L.  Pt taken to therapy gym total A in w/c for time and energy conservation. Seated EOM, completed dynamic reaching task with R UE placed in weightbearing position and pt required to cross midline to place items, forcing weight through R UE/LE. Required min-mod A to return to midline. Several instances of total LOB to R back onto mat. Noted to have increased tone in R UE, Performed passive ROM and stretching to all major muscle groups. Education, demonstration and teach back provided for self ROM exercises for shoulder flexion, elbow flexion/extension and wrist flexion/extention. At end of session, had pt complete self ROM exercises again, requiring mod A to recall exercises and technique. Completed lateral leans onto R  forearm with return to midline, x10 with guarding assist and manual facilitation to promote R UE weightbearing and assist. In therapy day room, completed sit>stand at high low table. Pt able to come into standing with mod A, however, required max A to obtain static standing balance and requiring blocking of R knee with multimodal cuing for upright posturing and R LE activation. Attempted towel pushes from standing position, however, then modified task to complete towel pushes from w/c level for increased attention to task.  Pt left seated in w/c at end of session with hand off to SLP for lunch session.     Therapy Documentation Precautions:  Precautions Precautions: Fall Precaution Comments: bradycardia Restrictions Weight Bearing Restrictions: No RUE Weight Bearing: Weight bearing as tolerated RLE Weight Bearing: Weight bearing as tolerated Pain:   No/denies pain ADL: ADL ADL Comments: refer to functional navigator  See Function Navigator for Current Functional Status.   Therapy/Group: Individual Therapy  Brantly Kalman L 12/14/2017, 7:03 AM

## 2017-12-15 ENCOUNTER — Inpatient Hospital Stay (HOSPITAL_COMMUNITY): Payer: BLUE CROSS/BLUE SHIELD | Admitting: Physical Therapy

## 2017-12-15 ENCOUNTER — Inpatient Hospital Stay (HOSPITAL_COMMUNITY): Payer: BLUE CROSS/BLUE SHIELD | Admitting: Occupational Therapy

## 2017-12-15 ENCOUNTER — Inpatient Hospital Stay (HOSPITAL_COMMUNITY): Payer: BLUE CROSS/BLUE SHIELD

## 2017-12-15 LAB — GLUCOSE, CAPILLARY
GLUCOSE-CAPILLARY: 147 mg/dL — AB (ref 65–99)
GLUCOSE-CAPILLARY: 164 mg/dL — AB (ref 65–99)
GLUCOSE-CAPILLARY: 184 mg/dL — AB (ref 65–99)
Glucose-Capillary: 148 mg/dL — ABNORMAL HIGH (ref 65–99)

## 2017-12-15 NOTE — Progress Notes (Signed)
Speech Language Pathology Daily Session Note  Patient Details  Name: Kelly Gibson MRN: [id number] Date of Birth: 11/11/1960  Today's Date: 12/15/2017 SLP Individual Time: 1200-1300 SLP Individual Time Calculation (min): 60 min  Short Term Goals: Week 1: SLP Short Term Goal 1 (Week 1): Patient will consume current diet with minimal overt s/s of aspiration and supervision verbal cues for use of swallowing compensatory strategies.  SLP Short Term Goal 2 (Week 1): Patient will decrease oral holding and initiate a timely swallow with thin liquids in 50% of opportunities with Mod A multimodal cues.  SLP Short Term Goal 3 (Week 1): Patient will recall new, daily information with use of external aids with Mod A multimodal cues.  SLP Short Term Goal 4 (Week 1): Patient will demonstrate functional problem solving for basic and familiar tasks with Min A verbal cues.  SLP Short Term Goal 5 (Week 1): Patient will utilize speech intelligibility strategies at the phrase level with Min A verbal cues to achieve 75% of intelligibility.   Skilled Therapeutic Interventions:Skilled ST services focused on swallow and cognitive skills. SLP facilitated Po consumption of dys 3 and thin liquids via cup requiring min A verbal cues for swallow strategies and swallow initiation delay of 3-5 seconds , no overt s/s aspiration noted. SLP facilitated basic problem solving and recall with novel card game, blink, pt required mod-min A verbal cues. SLP facilitated speech intelligibility at phrase level requiring mod A verbal cues for 80% intelligibility.  Pt was left in room with call bell within reach. Reccomend to continue skilled ST services.      Function:  Eating Eating   Modified Consistency Diet: Yes Eating Assist Level: Set up assist for;Supervision or verbal cues   Eating Set Up Assist For: Opening containers       Cognition Comprehension Comprehension assist level: Understands basic 90% of the time/cues <  10% of the time  Expression   Expression assist level: Expresses basic 75 - 89% of the time/requires cueing 10 - 24% of the time. Needs helper to occlude trach/needs to repeat words.;Expresses basic 50 - 74% of the time/requires cueing 25 - 49% of the time. Needs to repeat parts of sentences.  Social Interaction Social Interaction assist level: Interacts appropriately 90% of the time - Needs monitoring or encouragement for participation or interaction.  Problem Solving Problem solving assist level: Solves basic 50 - 74% of the time/requires cueing 25 - 49% of the time;Solves basic 75 - 89% of the time/requires cueing 10 - 24% of the time  Memory Memory assist level: Recognizes or recalls 50 - 74% of the time/requires cueing 25 - 49% of the time;Recognizes or recalls 75 - 89% of the time/requires cueing 10 - 24% of the time    Pain Pain Assessment Pain Score: 0-No pain  Therapy/Group: Individual Therapy  Noralee Dutko  [facility] 12/15/2017, 4:41 PM

## 2017-12-15 NOTE — Progress Notes (Signed)
Subjective/Complaints:  No issues overnite , left leg felt stiff in therapy yesterday  ROS: Patient denies fever, rash, sore throat, blurred vision, nausea, vomiting, diarrhea, cough, shortness of breath or chest pain, joint or back pain, headache, or mood change.   Objective: Vital Signs: Blood pressure 121/74, pulse 84, temperature 98.8 F (37.1 C), temperature source Oral, resp. rate 18, height 5' 2"  (1.575 m), weight 67.4 kg (148 lb 9.4 oz), last menstrual period 12/10/2012, SpO2 97 %. No results found. Results for orders placed or performed during the hospital encounter of 12/09/17 (from the past 72 hour(s))  Glucose, capillary     Status: Abnormal   Collection Time: 12/12/17  1:14 PM  Result Value Ref Range   Glucose-Capillary 145 (H) 65 - 99 mg/dL  Glucose, capillary     Status: Abnormal   Collection Time: 12/12/17  5:15 PM  Result Value Ref Range   Glucose-Capillary 164 (H) 65 - 99 mg/dL  Glucose, capillary     Status: Abnormal   Collection Time: 12/12/17  9:51 PM  Result Value Ref Range   Glucose-Capillary 120 (H) 65 - 99 mg/dL  Glucose, capillary     Status: Abnormal   Collection Time: 12/13/17  6:48 AM  Result Value Ref Range   Glucose-Capillary 127 (H) 65 - 99 mg/dL  Glucose, capillary     Status: Abnormal   Collection Time: 12/13/17  4:54 PM  Result Value Ref Range   Glucose-Capillary 114 (H) 65 - 99 mg/dL  Glucose, capillary     Status: Abnormal   Collection Time: 12/13/17  9:53 PM  Result Value Ref Range   Glucose-Capillary 112 (H) 65 - 99 mg/dL  Glucose, capillary     Status: Abnormal   Collection Time: 12/14/17  6:56 AM  Result Value Ref Range   Glucose-Capillary 113 (H) 65 - 99 mg/dL  Basic metabolic panel     Status: Abnormal   Collection Time: 12/14/17  6:58 AM  Result Value Ref Range   Sodium 143 135 - 145 mmol/L   Potassium 3.4 (L) 3.5 - 5.1 mmol/L   Chloride 106 101 - 111 mmol/L   CO2 26 22 - 32 mmol/L   Glucose, Bld 136 (H) 65 - 99 mg/dL   BUN  29 (H) 6 - 20 mg/dL   Creatinine, Ser 1.00 0.44 - 1.00 mg/dL   Calcium 9.7 8.9 - 10.3 mg/dL   GFR calc non Af Amer >60 >60 mL/min   GFR calc Af Amer >60 >60 mL/min    Comment: (NOTE) The eGFR has been calculated using the CKD EPI equation. This calculation has not been validated in all clinical situations. eGFR's persistently <60 mL/min signify possible Chronic Kidney Disease.    Anion gap 11 5 - 15    Comment: Performed at [facility] [address] [place], [place] [address]  Glucose, capillary     Status: Abnormal   Collection Time: 12/14/17  5:02 PM  Result Value Ref Range   Glucose-Capillary 155 (H) 65 - 99 mg/dL  Glucose, capillary     Status: Abnormal   Collection Time: 12/14/17  9:41 PM  Result Value Ref Range   Glucose-Capillary 157 (H) 65 - 99 mg/dL  Glucose, capillary     Status: Abnormal   Collection Time: 12/15/17  6:38 AM  Result Value Ref Range   Glucose-Capillary 147 (H) 65 - 99 mg/dL     Constitutional: No distress . Vital signs reviewed. HEENT: EOMI, oral membranes moist Neck:  supple Cardiovascular: RRR without murmur. No JVD    Respiratory: CTA Bilaterally without wheezes or rales. Normal effort    GI: BS +, non-tender, non-distended  Skin:   Intact Neuro: Alert/Oriented, Normal Sensory, Abnormal Motor 0/5 RUE adn RLE , 5/5 on left side and Dysarthric speech. Good insight and awareness. Musc/Skel:  Normal Psych: in good spirits   Assessment/Plan: 1. Functional deficits secondary to Right hemiplegia left subcortical infarcts which require 3+ hours per day of interdisciplinary therapy in a comprehensive inpatient rehab setting. Physiatrist is providing close team supervision and 24 hour management of active medical problems listed below. Physiatrist and rehab team continue to assess barriers to discharge/monitor patient progress toward functional and medical goals. FIM: Function - Bathing Position: Wheelchair/chair at sink Body parts bathed by  patient: Chest, Abdomen, Right upper leg, Left upper leg, Right arm Body parts bathed by helper: Left arm, Front perineal area, Buttocks, Right lower leg, Left lower leg, Back Assist Level: Touching or steadying assistance(Pt > 75%)  Function- Upper Body Dressing/Undressing What is the patient wearing?: Pull over shirt/dress Pull over shirt/dress - Perfomed by patient: Thread/unthread left sleeve, Put head through opening Pull over shirt/dress - Perfomed by helper: Thread/unthread right sleeve, Pull shirt over trunk Function - Lower Body Dressing/Undressing What is the patient wearing?: Pants, Shoes, Ted Hose Position: Wheelchair/chair at [employer]- Performed by helper: Thread/unthread right pants leg, Thread/unthread left pants leg, Pull pants up/down Shoes - Performed by helper: Don/doff right shoe, Don/doff left shoe, Fasten right, Fasten left TED Hose - Performed by helper: Don/doff right TED hose, Don/doff left TED hose  Function - Toileting Toileting steps completed by patient: Adjust clothing prior to toileting(hospital gown) Toileting steps completed by helper: Adjust clothing prior to toileting, Performs perineal hygiene, Adjust clothing after toileting Toileting Assistive Devices: Grab bar or rail Assist level: Two helpers  Function - Air cabin crew transfer assistive device: Drop arm commode, Mechanical lift Mechanical lift: Stedy Assist level to toilet: Total assist (Pt < 25%)(per Haylee Rivas, NT) Assist level from toilet: Total assist (Pt < 25%)  Function - Chair/bed transfer Chair/bed transfer method: Squat pivot Chair/bed transfer assist level: Moderate assist (Pt 50 - 74%/lift or lower) Chair/bed transfer assistive device: Armrests Chair/bed transfer details: Tactile cues for sequencing, Tactile cues for weight shifting, Tactile cues for posture, Tactile cues for placement, Verbal cues for sequencing, Verbal cues for technique, Visual cues/gestures for  sequencing, Manual facilitation for placement, Manual facilitation for weight shifting  Function - Locomotion: Wheelchair Type: Manual Max wheelchair distance: 80 ft  Assist Level: Touching or steadying assistance (Pt > 75%) Assist Level: Touching or steadying assistance (Pt > 75%) Wheel 150 feet activity did not occur: Safety/medical concerns Function - Locomotion: Ambulation Assistive device: Rail in hallway Max distance: 55f  Assist level: 2 helpers Assist level: 2 helpers Walk 50 feet with 2 turns activity did not occur: Safety/medical concerns Walk 150 feet activity did not occur: Safety/medical concerns Walk 10 feet on uneven surfaces activity did not occur: Safety/medical concerns  Function - Comprehension Comprehension: Auditory Comprehension assist level: Understands basic 90% of the time/cues < 10% of the time  Function - Expression Expression: Verbal Expression assist level: Expresses basic 75 - 89% of the time/requires cueing 10 - 24% of the time. Needs helper to occlude trach/needs to repeat words.  Function - Social Interaction Social Interaction assist level: Interacts appropriately 90% of the time - Needs monitoring or encouragement for participation or interaction.  Function - Problem Solving  Problem solving assist level: Solves basic 50 - 74% of the time/requires cueing 25 - 49% of the time  Function - Memory Memory assist level: Recognizes or recalls 50 - 74% of the time/requires cueing 25 - 49% of the time Patient normally able to recall (first 3 days only): Current season, That he or she is in a hospital, Staff names and faces  Medical Problem List and Plan: 1.Right hemiplegia with dysarthria and dysphagiasecondary to left periventricular white matter left corona radiata infarctions as well as history of left brain infarction March 2019  -continue therapies, Team conference today please see physician documentation under team conference tab, met with team  face-to-face to discuss problems,progress, and goals. Formulized individual treatment plan based on medical history, underlying problem and comorbidities. 2. DVT Prophylaxis/Anticoagulation: Subcutaneous Lovenox. Monitor for any bleeding episodes 3. Pain Management:Tylenol as needed 4. Mood:Provide emotional support 5. Neuropsych: This patientiscapable of making decisions on herown behalf. 6. Skin/Wound Care:Routine skin checks 7. Fluids/Electrolytes/Nutrition: encourage PO  -BUN was trending up Friday---recheck in the AM 5/21 8.Dysphagia. Dysphagia #2 thin liquids. Follow-up speech therapy 9.Hypertension. Norvasc 5 mg daily, lisinopril 40 mg daily Vitals:   12/14/17 2140 12/15/17 0424  BP: 135/67 121/74  Pulse: (!) 41 84  Resp: 19 18  Temp: 98.1 F (36.7 C) 98.8 F (37.1 C)  SpO2: 100% 97%   controlled 5/21 10.Diabetes mellitus with peripheral neuropathy. Hemoglobin A1c 8.4. Sliding scale insulin. Check blood sugars before meals and at bedtime. Patient on DiaBeta 2.5 mg daily, insulin glargine 10 units nightly. Resume as needed CBG (last 3)  Recent Labs    12/14/17 1702 12/14/17 2141 12/15/17 0638  GLUCAP 155* 157* 147*  excellent in hospital control 5/22 11.Hyperlipidemia. Crestor 12.COPD with tobacco abuse. NicoDerm patch. Provide counseling. Inhalers as directed. 13.5 mm right upper lobe nodule with nonspecific mildly enlarged mediastinal bilateral hilar lymph nodes per CT of the chest 12/08/2017. Recommendations are for noncontrast chest CT 12 months for evaluation 14.  HypoK mild likely nutritional started KCL 40mq 5/21 LOS (Days) 6 A FACE TO FACE EVALUATION WAS PERFORMED  ACharlett Blake5/22/2019, 9:38 AM

## 2017-12-15 NOTE — Plan of Care (Signed)
  Problem: Consults Goal: RH STROKE PATIENT EDUCATION Description See Patient Education module for education specifics  Outcome: Progressing   Problem: RH BLADDER ELIMINATION Goal: RH STG MANAGE BLADDER WITH ASSISTANCE Description STG Manage Bladder With Min Assistance  Outcome: Progressing   Problem: RH SAFETY Goal: RH STG ADHERE TO SAFETY PRECAUTIONS W/ASSISTANCE/DEVICE Description STG Adhere to Safety Precautions With Assistance/Device. With cues and reminders.  Outcome: Progressing Goal: RH STG DECREASED RISK OF FALL WITH ASSISTANCE Description STG Decreased Risk of Fall With Assistance. Cues and reminders.  Outcome: Progressing   Problem: RH KNOWLEDGE DEFICIT Goal: RH STG INCREASE KNOWLEDGE OF DIABETES Description Pt.'s husband will be able to demonstrate knowledge of how to use medications, CBG monitoring, diet with cues and reminders.  Outcome: Progressing Goal: RH STG INCREASE KNOWLEDGE OF HYPERTENSION Description Husband will be able to demonstrate knowledge of medications and diet with cues and reminders.  Outcome: Progressing Goal: RH STG INCREASE KNOWLEDGE OF DYSPHAGIA/FLUID INTAKE Description Husband will be able to demonstrate dysphagia management with diet cues and reminders.  Outcome: Progressing

## 2017-12-15 NOTE — Progress Notes (Signed)
Physical Therapy Weekly Progress Note  Patient Details  Name: Kelly Gibson MRN: [id number] Date of Birth: 1960-12-03  Beginning of progress report period: Dec 10, 2017 End of progress report period: Dec 15, 2017  Today's Date: 12/15/2017  Patient has met 2 of 5 short term goals.  Pt is making slow progress towards LTG's. Pt currently requires mod assist for squat pivot transfers and max assist for sit>stand. Pt presents with increased tone in RLE. Pt also requires significant assistance to attempt standing or gait with rail in hallway. Pt has initiated w/c mobility but has difficulty coordinating LUE/LLE for hemi technique. Pt would benefit from continued skilled PT treatment to focus on transfers, w/c mobility, dynamic sitting balance, gait, bed mobility, and pt/family education. Provided pt with home measurement sheet and she reports her husband is competing this. Also recommend pt have bed placed on main level of house as this therapist does not anticipate pt will be able to negotiate stairs to access bed/bath on 2nd level.   Patient continues to demonstrate the following deficits muscle weakness, decreased cardiorespiratoy endurance, decreased coordination, decreased attention to right, decreased awareness, decreased problem solving, decreased safety awareness and delayed processing, and decreased sitting balance, decreased standing balance, decreased postural control, hemiplegia and decreased balance strategies and therefore will continue to benefit from skilled PT intervention to increase functional independence with mobility.  Patient progressing toward long term goals..  Plan of care revisions: home ambulation & stair goal discontinued 2/2 not appropriate at this time, bed mobility downgraded to min assist 2/2 slow progress.  PT Short Term Goals Week 1:  PT Short Term Goal 1 (Week 1): Pt will perform bed mobiliy with mod assist  PT Short Term Goal 1 - Progress (Week 1): Partly  met(intermittent mod assist, assist can range min<>max assist) PT Short Term Goal 2 (Week 1): Pt will maintian sitting balance with min assist  PT Short Term Goal 2 - Progress (Week 1): Met(static sitting balance) PT Short Term Goal 3 (Week 1): Pt will ambulate 59f with max assist of 1.  PT Short Term Goal 3 - Progress (Week 1): Not met PT Short Term Goal 4 (Week 1): Pt will perform bed<>WC trasnfers with mod assist  PT Short Term Goal 4 - Progress (Week 1): Met PT Short Term Goal 5 (Week 1): pt will propell WC 1067fwith supervision assist  PT Short Term Goal 5 - Progress (Week 1): Not met Week 2:  PT Short Term Goal 1 (Week 2): Pt will propel w/c 50 ft with supervision. PT Short Term Goal 2 (Week 2): Pt will complete bed<>w/c transfers with min assist.  PT Short Term Goal 3 (Week 2): Pt will ambulate 30 ft with max assist +1 for NMR & strengthening.   Therapy Documentation Precautions:  Precautions Precautions: Fall Precaution Comments: bradycardia Restrictions Weight Bearing Restrictions: No RUE Weight Bearing: Weight bearing as tolerated RLE Weight Bearing: Weight bearing as tolerated   See Function Navigator for Current Functional Status.  Therapy/Group: Individual Therapy  ViWaunita Schooner/22/2019, 4:28 PM

## 2017-12-15 NOTE — Progress Notes (Signed)
Physical Therapy Session Note  Patient Details  Name: Kelly Gibson MRN: [id number] Date of Birth: 07/29/1960  Today's Date: 12/15/2017 PT Individual Time: 7858-8502 PT Individual Time Calculation (min): 70 min   Short Term Goals: Week 1:  PT Short Term Goal 1 (Week 1): Pt will perform bed mobiliy with mod assist  PT Short Term Goal 2 (Week 1): Pt will maintian sitting balance with min assist  PT Short Term Goal 3 (Week 1): Pt will ambulate 37ft with max assist of 1.  PT Short Term Goal 4 (Week 1): Pt will perform bed<>WC trasnfers with mod assist  PT Short Term Goal 5 (Week 1): pt will propell WC 188ft with supervision assist   Skilled Therapeutic Interventions/Progress Updates:  Pt received in w/c & agreeable to tx. No c/o pain reported. Pt reports her teenage grandchildren will stay with her this summer and her husband plans to take the first week off of work when she returns home. Discussed need for bed on first floor and pt reports her husband plans to move a bed down for her to use. Pt completes w/c<>mat table transfer via squat pivot with mod assist with cuing for hand placement and technique with manual facilitation for anterior weight shifting and pivoting. While sitting on EOM pt engaged in reaching activity with task focusing on dynamic sitting balance. Once pt reaches outside of BOS pt requires significant assistance to correct LOB & return to midline. Utilized wedge to promote neutral pelvis but pt continues to demonstrate slight posterior pelvic tilt. Pt noted to be incontinent of urine & assisted back to room via w/c total assist. Pt completed sit<>stand with max assist and cuing for hand placement to push to standing. Pt tolerated standing to allow NT to perform peri hygiene & don clean brief & pants total assist. Pt requires cuing to shift weight to L to orient to midline 2/2 slight R lateral lean. Back in gym, therapist removed cushion to lower w/c seat height to allow pt to  practice w/c mobility with L hemi technique. Therapist provides max multimodal cuing for hand placement and sequencing. Pt with difficulty attending to obstacles on the R while focusing on w/c propulsion, and requires max cuing to utilize LLE to steer w/c. Pt requires assistance to maintain linear trajectory/obstacle avoidance, and max cuing for technique for very short distances. At end of session pt left sitting in w/c (cushion returned), quick release belt donned & all needs within reach.  Therapy Documentation Precautions:  Precautions Precautions: Fall Precaution Comments: bradycardia Restrictions Weight Bearing Restrictions: No RUE Weight Bearing: Weight bearing as tolerated RLE Weight Bearing: Weight bearing as tolerated   See Function Navigator for Current Functional Status.   Therapy/Group: Individual Therapy  Waunita Schooner 12/15/2017, 4:18 PM

## 2017-12-15 NOTE — Progress Notes (Signed)
Occupational Therapy Session Note  Patient Details  Name: Kelly Gibson MRN: [id number] Date of Birth: 05/22/1961  Today's Date: 12/15/2017 OT Individual Time: 9702-6378 OT Individual Time Calculation (min): 60 min    Short Term Goals: Week 1:  OT Short Term Goal 1 (Week 1): Pt will be able to maintain static sitting EOB with S.  OT Short Term Goal 2 (Week 1): Pt will be able to sit at EOB with min A for UB self care with min A.  OT Short Term Goal 3 (Week 1): Pt will be able to transfer to [facility] with mod A of 1. OT Short Term Goal 4 (Week 1): Pt will be able to maintain static stand with mod A to allow caregiver to assist pt with clothing management.  OT Short Term Goal 5 (Week 1): Pt will be able to use L hand to support RUE during bed mobility and transfers.   Skilled Therapeutic Interventions/Progress Updates:    Pt seen for OT ADL bathing/dressing session. Pt sitting up in w/c upon arrival with husband present, pt agreeable to tx session and denying pain. Pt's husband present for only beginning of session, while here discussed d/c planning, who will be with pt at d/c etc.  Pt agreeable to bathing at shower level. Completed max A squat pivot transfer to drop arm BSC in shower, using grab bars for support. She bathed from seated position, max A hand over hand assist provided in order to incorportate R UE into bathing task. Intermittent steadying assist for dynamic sitting balance in supported seat. Required +2 assist for safety exiting shower transfering to weaker R side due to increased tone and instability, therapist unable to support and place during transfer back to w/c.  She dressed seated in w/c, able to recall hemi dressing technique independently. She stood at sink with mod A to stand, requiring blocking of R knee. Pt able to maintain static standing balance at sink with B UE support, min A and blocking of R knee. Pt left seated in w/c at end of session with QRB donned and all needs  in reach.  Therapy Documentation Precautions:  Precautions Precautions: Fall Precaution Comments: bradycardia Restrictions Weight Bearing Restrictions: No RUE Weight Bearing: Weight bearing as tolerated RLE Weight Bearing: Weight bearing as tolerated Pain:   No/denies pain ADL: ADL ADL Comments: refer to functional navigator  See Function Navigator for Current Functional Status.   Therapy/Group: Individual Therapy  Dontreal Miera L 12/15/2017, 7:09 AM

## 2017-12-15 NOTE — Patient Care Conference (Signed)
Inpatient RehabilitationTeam Conference and Plan of Care Update Date: 12/15/2017   Time: 10:50 AM    Patient Name: Kelly Gibson      Medical Record Number: [id number]  Date of Birth: 08/16/1960 Sex: Female         Room/Bed: 4W23C/4W23C-01 Payor Info: Payor: BLUE CROSS BLUE [employer]IELD / Plan: BCBS OTHER / Product Type: *No Product type* /    Admitting Diagnosis: L CVA  Admit Date/Time:  12/09/2017  3:57 PM Admission Comments: No comment available   Primary Diagnosis:  <principal problem not specified> Principal Problem: <principal problem not specified>  Patient Active Problem List   Diagnosis Date Noted  . CVA (cerebral vascular accident) ([place]) 12/09/2017  . Hemiparesis affecting right side as late effect of cerebrovascular accident (CVA) ([place])   . Dysarthria, post-stroke   . Gait disturbance, post-stroke   . Acute CVA (cerebrovascular accident) ([facility]) 12/05/2017  . Bradycardia   . Essential hypertension   . Weakness 10/26/2017  . Cerebrovascular disease 10/18/2017  . COPD (chronic obstructive pulmonary disease) ([place]) 02/18/2015  . Loss of weight 03/15/2014  . Non compliance w medication regimen 06/17/2013  . Post-menopause bleeding 06/16/2013  . Bronchitis 05/04/2012  . Tobacco user 05/04/2012  . Ganglion cyst 03/09/2012  . Anxiety 03/09/2012  . Essential hypertension, benign 12/17/2011  . Obesity, unspecified 12/17/2011  . Hyperlipidemia 12/17/2011  . Depression 12/17/2011  . Diabetes mellitus type II, uncontrolled ([place]) 12/14/2011    Expected Discharge Date: Expected Discharge Date: 12/30/17  Team Members Present: Physician leading conference: Dr. Alysia Penna Social Worker Present: Ovidio Kin, LCSW Nurse Present: Other (comment)(Katelynn Perkins-RN) PT Present: Lavone Nian, PT OT Present: Napoleon Form, OT SLP Present: Charolett Bumpers, SLP PPS Coordinator present : Daiva Nakayama, RN, CRRN     Current Status/Progress Goal Weekly Team Focus  Medical    Developing spasticity right upper and right lower limb, little motor recovery, severe dysarthria  Maintain medical stability  Monitor heart rate has had some intermittent asymptomatic bradycardia   Bowel/Bladder   continent of bowel and bladder  maintain continence  assist with toileting as needed   Swallow/Nutrition/ Hydration   Dys 3 and thin , Min A  Supervision A  swallow initation increas and oral residue   ADL's   Min A UB bathing/dressing; total A +2 toileting; max A LB dressing; mod-max A functional transfers  Supervision-min A overall  ADL re-training, functional standing balance, neuro re-ed   Mobility   min<>max assist bed mobility, mod<>max assist transfers, gait with rail in hallway & +2 assist  supervision w/c mobility, min assist gait & stair negotiation, supervision bed mobility, supervision<>min assist transfers  -- goals will likely be downgraded to transfers & w/c level  transfers, NMR, balance, w/c mobility, strengthening, endurance   Communication   Mod A phrase level  Supervision A  speech intelligibility strategies    Safety/Cognition/ Behavioral Observations  Mod A  MIn A  recall and problem solving    Pain   Denies pain  Pain < 2  Assess Qshift and PRN   Skin   Skin intact  Prevent breakdown  Assess Qshift and PRN      *See Care Plan and progress notes for long and short-term goals.     Barriers to Discharge  Current Status/Progress Possible Resolutions Date Resolved   Physician    Medical stability;Decreased caregiver support  Husband works during the day other caregivers are 84 and 42 year old grandchildren  35 towards goals  Continue rehab  Nursing                  PT  Inaccessible home environment;Decreased caregiver support;Lack of/limited family support  bed/bath on 2nd floor with 2 flights & landing to access, unsure if pt has 24 hr supervision/assist upon d/c              OT                  SLP                SW                 Discharge Planning/Teaching Needs:  HOme husband is working on caregiver at home, he does work days and children all work. Will need to come up with a plan for 24 hr care at home      Team Discussion:  Goals supervision-min assist level difficulty with tone in leg, may need to be WC level at home. Upgraded diet to Dys 3 thin. Balance and endurance issues working on. Will need 24 hr care at discharge.  Revisions to Treatment Plan:  DC 6/6    Continued Need for Acute Rehabilitation Level of Care: The patient requires daily medical management by a physician with specialized training in physical medicine and rehabilitation for the following conditions: Daily direction of a multidisciplinary physical rehabilitation program to ensure safe treatment while eliciting the highest outcome that is of practical value to the patient.: Yes Daily medical management of patient stability for increased activity during participation in an intensive rehabilitation regime.: Yes Daily analysis of laboratory values and/or radiology reports with any subsequent need for medication adjustment of medical intervention for : Neurological problems  Donnita Farina, Gardiner Rhyme 12/15/2017, 12:53 PM

## 2017-12-16 ENCOUNTER — Inpatient Hospital Stay (HOSPITAL_COMMUNITY): Payer: BLUE CROSS/BLUE SHIELD

## 2017-12-16 ENCOUNTER — Inpatient Hospital Stay (HOSPITAL_COMMUNITY): Payer: BLUE CROSS/BLUE SHIELD | Admitting: Speech Pathology

## 2017-12-16 ENCOUNTER — Inpatient Hospital Stay (HOSPITAL_COMMUNITY): Payer: BLUE CROSS/BLUE SHIELD | Admitting: Occupational Therapy

## 2017-12-16 LAB — CREATININE, SERUM
CREATININE: 1.19 mg/dL — AB (ref 0.44–1.00)
GFR calc Af Amer: 58 mL/min — ABNORMAL LOW (ref >60)
GFR calc non Af Amer: 50 mL/min — ABNORMAL LOW (ref >60)

## 2017-12-16 LAB — GLUCOSE, CAPILLARY
GLUCOSE-CAPILLARY: 172 mg/dL — AB (ref 65–99)
GLUCOSE-CAPILLARY: 136 mg/dL — AB (ref 65–99)
Glucose-Capillary: 145 mg/dL — ABNORMAL HIGH (ref 65–99)
Glucose-Capillary: 128 mg/dL — ABNORMAL HIGH (ref 65–99)

## 2017-12-16 MED ORDER — POTASSIUM CHLORIDE CRYS ER 10 MEQ PO TBCR
10.0000 meq | EXTENDED_RELEASE_TABLET | Freq: Every day | ORAL | Status: DC
  Administered 2017-12-16 – 2017-12-19 (×4): 10 meq via ORAL
  Filled 2017-12-16 (×4): qty 1

## 2017-12-16 MED ORDER — TIZANIDINE HCL 2 MG PO TABS
2.0000 mg | ORAL_TABLET | Freq: Three times a day (TID) | ORAL | Status: DC
Start: 2017-12-16 — End: 2017-12-18
  Administered 2017-12-16 – 2017-12-18 (×6): 2 mg via ORAL
  Filled 2017-12-16 (×6): qty 1

## 2017-12-16 NOTE — Progress Notes (Signed)
Subjective/Complaints:  Discussed spasticity with patient and her husband today.  Also discussed need for increased fluid intake  ROS: Patient denies fever, rash, sore throat, blurred vision, nausea, vomiting, diarrhea, cough, shortness of breath or chest pain, joint or back pain, headache, or mood change.   Objective: Vital Signs: Blood pressure 140/73, pulse (!) 54, temperature 97.8 F (36.6 C), temperature source Oral, resp. rate 18, height 5' 2" (1.575 m), weight 67.4 kg (148 lb 9.4 oz), last menstrual period 12/10/2012, SpO2 98 %. No results found. Results for orders placed or performed during the hospital encounter of 12/09/17 (from the past 72 hour(s))  Glucose, capillary     Status: Abnormal   Collection Time: 12/13/17  4:54 PM  Result Value Ref Range   Glucose-Capillary 114 (H) 65 - 99 mg/dL  Glucose, capillary     Status: Abnormal   Collection Time: 12/13/17  9:53 PM  Result Value Ref Range   Glucose-Capillary 112 (H) 65 - 99 mg/dL  Glucose, capillary     Status: Abnormal   Collection Time: 12/14/17  6:56 AM  Result Value Ref Range   Glucose-Capillary 113 (H) 65 - 99 mg/dL  Basic metabolic panel     Status: Abnormal   Collection Time: 12/14/17  6:58 AM  Result Value Ref Range   Sodium 143 135 - 145 mmol/L   Potassium 3.4 (L) 3.5 - 5.1 mmol/L   Chloride 106 101 - 111 mmol/L   CO2 26 22 - 32 mmol/L   Glucose, Bld 136 (H) 65 - 99 mg/dL   BUN 29 (H) 6 - 20 mg/dL   Creatinine, Ser 1.00 0.44 - 1.00 mg/dL   Calcium 9.7 8.9 - 10.3 mg/dL   GFR calc non Af Amer >60 >60 mL/min   GFR calc Af Amer >60 >60 mL/min    Comment: (NOTE) The eGFR has been calculated using the CKD EPI equation. This calculation has not been validated in all clinical situations. eGFR's persistently <60 mL/min signify possible Chronic Kidney Disease.    Anion gap 11 5 - 15    Comment: Performed at [facility] [address], [place], [place] [address]  Glucose, capillary     Status: Abnormal    Collection Time: 12/14/17  5:02 PM  Result Value Ref Range   Glucose-Capillary 155 (H) 65 - 99 mg/dL  Glucose, capillary     Status: Abnormal   Collection Time: 12/14/17  9:41 PM  Result Value Ref Range   Glucose-Capillary 157 (H) 65 - 99 mg/dL  Glucose, capillary     Status: Abnormal   Collection Time: 12/15/17  6:38 AM  Result Value Ref Range   Glucose-Capillary 147 (H) 65 - 99 mg/dL  Glucose, capillary     Status: Abnormal   Collection Time: 12/15/17 11:37 AM  Result Value Ref Range   Glucose-Capillary 164 (H) 65 - 99 mg/dL  Glucose, capillary     Status: Abnormal   Collection Time: 12/15/17  4:40 PM  Result Value Ref Range   Glucose-Capillary 148 (H) 65 - 99 mg/dL  Glucose, capillary     Status: Abnormal   Collection Time: 12/15/17  9:22 PM  Result Value Ref Range   Glucose-Capillary 184 (H) 65 - 99 mg/dL  Glucose, capillary     Status: Abnormal   Collection Time: 12/16/17  6:29 AM  Result Value Ref Range   Glucose-Capillary 145 (H) 65 - 99 mg/dL  Creatinine, serum     Status: Abnormal   Collection  Time: 12/16/17  7:00 AM  Result Value Ref Range   Creatinine, Ser 1.19 (H) 0.44 - 1.00 mg/dL   GFR calc non Af Amer 50 (L) >60 mL/min   GFR calc Af Amer 58 (L) >60 mL/min    Comment: (NOTE) The eGFR has been calculated using the CKD EPI equation. This calculation has not been validated in all clinical situations. eGFR's persistently <60 mL/min signify possible Chronic Kidney Disease. Performed at [facility], [address] [place], [place] [address]   Glucose, capillary     Status: Abnormal   Collection Time: 12/16/17 11:38 AM  Result Value Ref Range   Glucose-Capillary 172 (H) 65 - 99 mg/dL     Constitutional: No distress . Vital signs reviewed. HEENT: EOMI, oral membranes moist Neck: supple Cardiovascular: RRR without murmur. No JVD    Respiratory: CTA Bilaterally without wheezes or rales. Normal effort    GI: BS +, non-tender, non-distended  Skin:    Intact Neuro: Alert/Oriented, Normal Sensory, Abnormal Motor 0/5 RUE adn RLE , 5/5 on left side and Dysarthric speech. Good insight and awareness. Musc/Skel:  Normal Psych: in good spirits   Assessment/Plan: 1. Functional deficits secondary to Right hemiplegia left subcortical infarcts which require 3+ hours per day of interdisciplinary therapy in a comprehensive inpatient rehab setting. Physiatrist is providing close team supervision and 24 hour management of active medical problems listed below. Physiatrist and rehab team continue to assess barriers to discharge/monitor patient progress toward functional and medical goals. FIM: Function - Bathing Position: Shower Body parts bathed by patient: Chest, Abdomen, Right upper leg, Left upper leg, Right arm Body parts bathed by helper: Left arm, Front perineal area, Buttocks, Right lower leg, Left lower leg, Back Assist Level: Touching or steadying assistance(Pt > 75%)  Function- Upper Body Dressing/Undressing What is the patient wearing?: Pull over shirt/dress Pull over shirt/dress - Perfomed by patient: Thread/unthread left sleeve, Put head through opening, Thread/unthread right sleeve, Pull shirt over trunk Pull over shirt/dress - Perfomed by helper: Thread/unthread right sleeve, Pull shirt over trunk Assist Level: Touching or steadying assistance(Pt > 75%) Function - Lower Body Dressing/Undressing What is the patient wearing?: Pants, Shoes, Ted Hose Position: Wheelchair/chair at sink Pants- Performed by patient: Thread/unthread left pants leg Pants- Performed by helper: Pull pants up/down, Thread/unthread right pants leg Shoes - Performed by helper: Don/doff right shoe, Don/doff left shoe, Fasten right, Fasten left TED Hose - Performed by helper: Don/doff right TED hose, Don/doff left TED hose Assist for footwear: Maximal assist  Function - Toileting Toileting activity did not occur: No continent bowel/bladder event Toileting steps  completed by patient: Adjust clothing prior to toileting(hospital gown) Toileting steps completed by helper: Adjust clothing prior to toileting, Performs perineal hygiene, Adjust clothing after toileting Toileting Assistive Devices: Grab bar or rail Assist level: Two helpers  Function - Toilet Transfers Toilet transfer assistive device: Drop arm commode, Mechanical lift Mechanical lift: Stedy Assist level to toilet: Total assist (Pt < 25%)(per Haylee Rivas, NT) Assist level from toilet: Total assist (Pt < 25%)  Function - Chair/bed transfer Chair/bed transfer method: Squat pivot Chair/bed transfer assist level: Moderate assist (Pt 50 - 74%/lift or lower) Chair/bed transfer assistive device: Armrests Chair/bed transfer details: Tactile cues for placement, Verbal cues for technique, Verbal cues for sequencing, Manual facilitation for weight shifting, Manual facilitation for placement  Function - Locomotion: Wheelchair Type: Manual Max wheelchair distance: 25 ft Assist Level: Touching or steadying assistance (Pt > 75%) Assist Level: Touching or   steadying assistance (Pt > 75%) Wheel 150 feet activity did not occur: Safety/medical concerns Function - Locomotion: Ambulation Assistive device: Rail in hallway Max distance: 15ft  Assist level: 2 helpers Assist level: 2 helpers Walk 50 feet with 2 turns activity did not occur: Safety/medical concerns Walk 150 feet activity did not occur: Safety/medical concerns Walk 10 feet on uneven surfaces activity did not occur: Safety/medical concerns  Function - Comprehension Comprehension: Auditory Comprehension assist level: Understands basic 90% of the time/cues < 10% of the time  Function - Expression Expression: Verbal Expression assist level: Expresses basic 75 - 89% of the time/requires cueing 10 - 24% of the time. Needs helper to occlude trach/needs to repeat words.  Function - Social Interaction Social Interaction assist level: Interacts  appropriately 90% of the time - Needs monitoring or encouragement for participation or interaction.  Function - Problem Solving Problem solving assist level: Solves basic 75 - 89% of the time/requires cueing 10 - 24% of the time  Function - Memory Memory assist level: Recognizes or recalls 75 - 89% of the time/requires cueing 10 - 24% of the time Patient normally able to recall (first 3 days only): Current season, That he or she is in a hospital, Staff names and faces  Medical Problem List and Plan: 1.Right hemiplegia with dysarthria and dysphagiasecondary to left periventricular white matter left corona radiata infarctions as well as history of left brain infarction March 2019  -continue therapies, Team conference today please see physician documentation under team conference tab, met with team face-to-face to discuss problems,progress, and goals. Formulized individual treatment plan based on medical history, underlying problem and comorbidities. 2. DVT Prophylaxis/Anticoagulation: Subcutaneous Lovenox. Monitor for any bleeding episodes 3. Pain Management:Tylenol as needed 4. Mood:Provide emotional support 5. Neuropsych: This patientiscapable of making decisions on herown behalf. 6. Skin/Wound Care:Routine skin checks 7. Fluids/Electrolytes/Nutrition: encourage PO  -BUN was trending up, encourage p.o. will not start IV fluids at this time, fluid intake 12/15/2017 was 540 mL 8.Dysphagia. Dysphagia #2 thin liquids. Follow-up speech therapy 9.Hypertension. Norvasc 5 mg daily, lisinopril 40 mg daily Vitals:   12/16/17 0428 12/16/17 1400  BP: 124/69 140/73  Pulse: 87 (!) 54  Resp: 17 18  Temp: 98.7 F (37.1 C) 97.8 F (36.6 C)  SpO2: 98% 98%   controlled 5/23 10.Diabetes mellitus with peripheral neuropathy. Hemoglobin A1c 8.4. Sliding scale insulin. Check blood sugars before meals and at bedtime. Patient on DiaBeta 2.5 mg daily, insulin glargine 10 units nightly.  Resume as needed CBG (last 3)  Recent Labs    12/15/17 2122 12/16/17 0629 12/16/17 1138  GLUCAP 184* 145* 172*  excellent in hospital control 5/23 11.Hyperlipidemia. Crestor 12.COPD with tobacco abuse. NicoDerm patch. Provide counseling. Inhalers as directed. 13.5 mm right upper lobe nodule with nonspecific mildly enlarged mediastinal bilateral hilar lymph nodes per CT of the chest 12/08/2017. Recommendations are for noncontrast chest CT 12 months for evaluation 14.  HypoK mild likely nutritional started KCL 10meq 5/23 15.  Spasticity will start low-dose tizanidine continue PT and OT LOS (Days) 7 A FACE TO FACE EVALUATION WAS PERFORMED  Andrew E Kirsteins 12/16/2017, 2:54 PM  

## 2017-12-16 NOTE — Progress Notes (Addendum)
Physical Therapy Note  Patient Details  Name: Kelly Gibson MRN: [id number] Date of Birth: 1960/12/30 Today's Date: 12/16/2017  tx 1:  [phone], 50 min individual tx Pain: none per pt  Squat pivot w/c > mat to L, mod assist.    neuromuscular re-education via positioning for sustained stretch R hamstrings and heel cord in sitting, with bil hands above R knee, using step stool, x 1 min x 2.   Therapeutic activities to promote forward wt shift for transfers, and lateral scooting to R with max cues for maintaining midline orientation.  Sit> stand to use L hand to match cards to board in front of her; min> mod assist for R stance stability, x 3 minutes x 2.  .  Pt has poor awareness of R knee position.  Sit> stand with mod assist; max assist to complete stand pivot transfer to L.  Pt left resting in w/c with quick release belt applied, seat alarm set,  and all needs within reach.  tx 2:  1300-1350, 50 min individual tx Pain: none per pt  tx focused on using RAFO in pt's more structured pair of shoes; inner sole could not be removed; shoe cover placed over shoe; L heel lift placed in L shoe.   Gait training using hallway railing x 12' with max assist, and w/c follow. Gait training x 8' with EVA walker, unlocked upon pt standing.   1 person for steering EVA, and PT assisting with RLE swing phase and wt shifting.  Pt initiated hip flexion repeatedly during stepping.  Pt left resting in w/c with quick release belt applied, bed alarm set and all needs within reach.  See function navigator for current status.  Kelly Gibson 12/16/2017, 8:00 AM

## 2017-12-16 NOTE — Progress Notes (Signed)
Speech Language Pathology Daily Session Note  Patient Details  Name: Kelly Gibson MRN: [id number] Date of Birth: 07/22/1961  Today's Date: 12/16/2017 SLP Individual Time: 0930-1000 SLP Individual Time Calculation (min): 30 min  Short Term Goals: Week 1: SLP Short Term Goal 1 (Week 1): Patient will consume current diet with minimal overt s/s of aspiration and supervision verbal cues for use of swallowing compensatory strategies.  SLP Short Term Goal 2 (Week 1): Patient will decrease oral holding and initiate a timely swallow with thin liquids in 50% of opportunities with Mod A multimodal cues.  SLP Short Term Goal 3 (Week 1): Patient will recall new, daily information with use of external aids with Mod A multimodal cues.  SLP Short Term Goal 4 (Week 1): Patient will demonstrate functional problem solving for basic and familiar tasks with Min A verbal cues.  SLP Short Term Goal 5 (Week 1): Patient will utilize speech intelligibility strategies at the phrase level with Min A verbal cues to achieve 75% of intelligibility.   Skilled Therapeutic Interventions: Skilled treatment session focused on cognitive goals. Patient independently recalled the function of all of her current medications and organized a BID pillbox with supervision verbal cues for problem solving. Patient also required extra time and Min a verbal cues to recall events from previous therapy session in order to record them in her memory notebook. Patient left upright in wheelchair with quick release belt in place. Continue with current plan of care.      Function:  Cognition Comprehension Comprehension assist level: Understands basic 90% of the time/cues < 10% of the time  Expression   Expression assist level: Expresses basic 75 - 89% of the time/requires cueing 10 - 24% of the time. Needs helper to occlude trach/needs to repeat words.  Social Interaction Social Interaction assist level: Interacts appropriately 90% of the time  - Needs monitoring or encouragement for participation or interaction.  Problem Solving Problem solving assist level: Solves basic 75 - 89% of the time/requires cueing 10 - 24% of the time  Memory Memory assist level: Recognizes or recalls 75 - 89% of the time/requires cueing 10 - 24% of the time    Pain No/Denies Pain   Therapy/Group: Individual Therapy  Kelly Gibson 12/16/2017, 10:25 AM

## 2017-12-16 NOTE — Progress Notes (Signed)
Social Work Patient ID: Kelly Gibson, female   DOB: 1960-09-03, 57 y.o.   MRN: [id number]  Met with pt and husband who was here before going to work to discuss team conference goals min assist and the target discharge date 6/6. He plans to be off for the first week and then between he and their daughter's along with the grandchildren they will provide 24 hr care. Informed him the 42 & 73 yo grandchildren could not be pt's caregiver's. He voiced they will not be and will be there to bring her things or to sit with her. He will participate in therapies when he is here and will have daughter's come in also for education prior to discharge.

## 2017-12-16 NOTE — Progress Notes (Signed)
Occupational Therapy Session Note  Patient Details  Name: Kelly Gibson MRN: [id number] Date of Birth: 09/24/1960  Today's Date: 12/16/2017 OT Individual Time: 5465-6812 OT Individual Time Calculation (min): 75 min    Short Term Goals: Week 1:  OT Short Term Goal 1 (Week 1): Pt will be able to maintain static sitting EOB with S.  OT Short Term Goal 2 (Week 1): Pt will be able to sit at EOB with min A for UB self care with min A.  OT Short Term Goal 3 (Week 1): Pt will be able to transfer to [facility] with mod A of 1. OT Short Term Goal 4 (Week 1): Pt will be able to maintain static stand with mod A to allow caregiver to assist pt with clothing management.  OT Short Term Goal 5 (Week 1): Pt will be able to use L hand to support RUE during bed mobility and transfers.   Skilled Therapeutic Interventions/Progress Updates:    Pt seen for OT session focusing on ADL re-training and w/c propulsion. Pt sitting up in w/c upon arrival with husband present, pt agreeable to tx session.  She doffed socks from w/c position, assist to otbtain and maintain figure four sitting position in order to access R foot to doff sock and don shoes.  She stood at sink with mod progressing to max A for standing balance with blocking of R knee. PT washed hands from standing position, VCs to attend to R hand during task.  She self propelled w/c to thrapy day room using hemi technique with min-mod A for steering and effective w/c propulsion technique.  She ate breakfast in therapy day room, max- total A hand over hand in order to use R UE at stabilizer level when opening containers. Used R UE several times in order to manage drinking glass. R UE placed in weighbearing position and in line of sight during meal to increase R UE attention and weightbearing through extremity.  Returned to room total A for time and energy conservation. Pt unable to recall self ROM exercises taught in previous sessions. Reviewed exercises and hands on  education/demosntration with husband regarding ROM and stretching of R UE.  Pt's husband reported he has been helping pt with LB dressing and toileting task. Using STEDY majority of time, however, some instances of transferring without use of lift. Extensive education provided regarding need for hospital staff assisst until checked off by therapy as well as pt's high fall risk.  Time at end of session for pt's husband to demonstrate use of STEDY and completed transfer to toilet. Cuing provided by therapist for safety and proper form/technique of body mechanics. Stressed importance of not attempting standing or transfers without use of lift.   Therapy Documentation Precautions:  Precautions Precautions: Fall Precaution Comments: bradycardia Restrictions Weight Bearing Restrictions: No RUE Weight Bearing: Weight bearing as tolerated RLE Weight Bearing: Weight bearing as tolerated Pain:   No/denies pain ADL: ADL ADL Comments: refer to functional navigator  See Function Navigator for Current Functional Status.   Therapy/Group: Individual Therapy  Yosgar Demirjian L 12/16/2017, 6:50 AM

## 2017-12-17 ENCOUNTER — Inpatient Hospital Stay (HOSPITAL_COMMUNITY): Payer: BLUE CROSS/BLUE SHIELD | Admitting: Speech Pathology

## 2017-12-17 ENCOUNTER — Inpatient Hospital Stay (HOSPITAL_COMMUNITY): Payer: BLUE CROSS/BLUE SHIELD | Admitting: Physical Therapy

## 2017-12-17 ENCOUNTER — Inpatient Hospital Stay (HOSPITAL_COMMUNITY): Payer: BLUE CROSS/BLUE SHIELD

## 2017-12-17 ENCOUNTER — Inpatient Hospital Stay (HOSPITAL_COMMUNITY): Payer: BLUE CROSS/BLUE SHIELD | Admitting: Occupational Therapy

## 2017-12-17 LAB — GLUCOSE, CAPILLARY
GLUCOSE-CAPILLARY: 131 mg/dL — AB (ref 65–99)
GLUCOSE-CAPILLARY: 184 mg/dL — AB (ref 65–99)
Glucose-Capillary: 176 mg/dL — ABNORMAL HIGH (ref 65–99)
Glucose-Capillary: 165 mg/dL — ABNORMAL HIGH (ref 65–99)

## 2017-12-17 NOTE — Progress Notes (Signed)
Occupational Therapy Weekly Progress Note  Patient Details  Name: Kelly Gibson MRN: [id number] Date of Birth: 02/08/1961  Beginning of progress report period: Dec 10, 2017 End of progress report period: Dec 17, 2017  Today's Date: 12/17/2017 OT Individual Time: 1045-1200 OT Individual Time Calculation (min): 75 min    Patient has met 4 of 5 short term goals.  Pt is making steady progress towards OT goals. SHe is able to complete basic squat pivot transfers at min-mod A level. She requires use of STEDY vs. Max A for standing balance during toileting tasks due to poor balance and R LE weakness, requiring blocking of R knee to prevent buckling. Pt cont to require assist for dynamic unsupported sitting balance, able to sit statically with supervision, however, can have total R LOB episodes from sitting position when completing functional tasks.  Pt's husband has been present intermittently and outside of therapy time. He is aware of pt's current physical and cognitive status. Provided him with w/c home measurement sheet and have begun d/c planning.   Patient continues to demonstrate the following deficits: abnormal posture, apraxia, cognitive deficits, disturbance of vision and hemiplegia affecting dominant side and therefore will continue to benefit from skilled OT intervention to enhance overall performance with BADL and Reduce care partner burden.  Patient progressing toward long term goals..  Continue plan of care.  OT Short Term Goals Week 1:  OT Short Term Goal 1 (Week 1): Pt will be able to maintain static sitting EOB with S.  OT Short Term Goal 1 - Progress (Week 1): Met OT Short Term Goal 2 (Week 1): Pt will be able to sit at EOB with min A for UB self care with min A.  OT Short Term Goal 2 - Progress (Week 1): Progressing toward goal OT Short Term Goal 3 (Week 1): Pt will be able to transfer to [facility] with mod A of 1. OT Short Term Goal 3 - Progress (Week 1): Met OT Short Term Goal 4  (Week 1): Pt will be able to maintain static stand with mod A to allow caregiver to assist pt with clothing management.  OT Short Term Goal 4 - Progress (Week 1): Met OT Short Term Goal 5 (Week 1): Pt will be able to use L hand to support RUE during bed mobility and transfers.  OT Short Term Goal 5 - Progress (Week 1): Met Week 2:  OT Short Term Goal 1 (Week 2): Pt will complete 2/3 toileting tasks with mod steadying assist in order to reduce caregiver burden OT Short Term Goal 2 (Week 2): Pt will don shirt with min A OT Short Term Goal 3 (Week 2): Pt will thread B LEs into pants with steadying assist OT Short Term Goal 4 (Week 2): Pt will recall and utilize hemi dressing technique mod I  Skilled Therapeutic Interventions/Progress Updates:    Pt seen for OT ADL bathing/dressing session. Pt asleep in w/c upon arrival, easily awoken and agreeable to tx session. She was unable to recall events of previous sessions, though was oriented to current date as well as d/c date.  She completed stand/squat pivot transfers throughout session. Transferred onto [facility] in shower, mod A to enter shower transferring to L, required +2 assist to exit shower when transferring to weaker R side. Pt with difficulty maintaining weightshift in ordre to successfully pivot to R. She bathed seated on BSC, heavy steadying assist for dynamic sitting balance when bending towards floor to wash B feet. She dressed  seated in w/c. Able to thread LEs into underwear and introduced for reacher to assist with threading pants. Pt will benefit from cont practice with reacher. She stood at sink and with mod-max A for standing balance, pt able to pull up pants and underwear. Following seated rest break, pt stood to complete oral care, assist to block R knee and R UE placed in weightbearing position. Increased flexor tone in R UE when standing. During oral care voiced need for toileting task, however, once squat pivot transfer completed to toilet pt  had been incontinent of urine. Changed pants seated on toilet and brief donned. Stood at toilet with mod A for clothing management to be completed with total A. Pt returned to w/c at end of session, left seated with QRB donned, all needs in reach.   Therapy Documentation Precautions:  Precautions Precautions: Fall Precaution Comments: bradycardia Restrictions Weight Bearing Restrictions: No RUE Weight Bearing: Weight bearing as tolerated RLE Weight Bearing: Weight bearing as tolerated Pain:    No/denies pain ADL: ADL ADL Comments: refer to functional navigator  See Function Navigator for Current Functional Status.   Therapy/Group: Individual Therapy  Hurshel Bouillon L 12/17/2017, 6:58 AM

## 2017-12-17 NOTE — Progress Notes (Signed)
Speech Language Pathology Weekly Progress and Session Note  Patient Details  Name: Kelly Gibson MRN: [id number] Date of Birth: 02/11/1961  Beginning of progress report period: Dec 10, 2017 End of progress report period: Dec 17, 2017  Today's Date: 12/17/2017 SLP Individual Time: 0830-0900 SLP Individual Time Calculation (min): 30 min  Short Term Goals: Week 1: SLP Short Term Goal 1 (Week 1): Patient will consume current diet with minimal overt s/s of aspiration and supervision verbal cues for use of swallowing compensatory strategies.  SLP Short Term Goal 1 - Progress (Week 1): Met SLP Short Term Goal 2 (Week 1): Patient will decrease oral holding and initiate a timely swallow with thin liquids in 50% of opportunities with Mod A multimodal cues.  SLP Short Term Goal 2 - Progress (Week 1): Met SLP Short Term Goal 3 (Week 1): Patient will recall new, daily information with use of external aids with Mod A multimodal cues.  SLP Short Term Goal 3 - Progress (Week 1): Met SLP Short Term Goal 4 (Week 1): Patient will demonstrate functional problem solving for basic and familiar tasks with Min A verbal cues.  SLP Short Term Goal 4 - Progress (Week 1): Met SLP Short Term Goal 5 (Week 1): Patient will utilize speech intelligibility strategies at the phrase level with Min A verbal cues to achieve 75% of intelligibility.  SLP Short Term Goal 5 - Progress (Week 1): Met    New Short Term Goals: Week 2: SLP Short Term Goal 1 (Week 2): Patient will recall new, daily information with use of external aids with Min A multimodal cues.  SLP Short Term Goal 2 (Week 2): Patient will demonstrate functional problem solving for basic and familiar tasks with supervision verbal cues.  SLP Short Term Goal 3 (Week 2): Patient will utilize speech intelligibility strategies at the sentence level with Min A verbal cues to achieve 90% of intelligibility.  SLP Short Term Goal 4 (Week 2): Patient will consume trials of  regular textures and demonstrate efficient mastication with complete oral clearance without overt s/s of aspiration over 2 sessions prior to upgrade with supervision verbal cues.   Weekly Progress Updates: Patient has made excellent gains and has met 5 of 5 STG's this reporting period. Currently, patient is consuming Dys. 3 textures with thin liquids with minimal overt s/s of aspiration and supervision verbal cues for use of swallowing compensatory strategies. Patient also demonstrates increased speech intelligibility at the phrase level and requires overall Min A verbal cues to self-monitor and correct speech errors for ~75-90% intelligibility. Patient demonstrates increased recall with use of external aids and problem solving with functional tasks requiring overall Min A verbal cues. Patient and family education is ongoing. Patient would benefit from continued skilled SLP intervention to maximize her cognitive and swallowing function as well as her speech intelligibility prior to discharge.      Intensity: Minumum of 1-2 x/day, 30 to 90 minutes Frequency: 3 to 5 out of 7 days Duration/Length of Stay: 12/30/17 Treatment/Interventions: Cognitive remediation/compensation;Environmental controls;Internal/external aids;Speech/Language facilitation;Therapeutic Activities;Patient/family education;Functional tasks;Dysphagia/aspiration precaution training;Cueing hierarchy   Daily Session  Skilled Therapeutic Interventions: Skilled treatment session focused on cognitive goals. SLP facilitated session with extra time and Min A verbal cues for functional problem solving during a basic money management task. Patient left upright in wheelchair with alarm on, quick release belt in place and all needs wihtin reach. Continue with current plan of care.       Function:   Cognition Comprehension Comprehension assist  level: Understands basic 90% of the time/cues < 10% of the time  Expression   Expression assist  level: Expresses basic 75 - 89% of the time/requires cueing 10 - 24% of the time. Needs helper to occlude trach/needs to repeat words.  Social Interaction Social Interaction assist level: Interacts appropriately 90% of the time - Needs monitoring or encouragement for participation or interaction.  Problem Solving Problem solving assist level: Solves basic 75 - 89% of the time/requires cueing 10 - 24% of the time  Memory Memory assist level: Recognizes or recalls 75 - 89% of the time/requires cueing 10 - 24% of the time   Pain No/Denies Pain   Therapy/Group: Individual Therapy  [place] Sorn 12/17/2017, 3:49 PM

## 2017-12-17 NOTE — Progress Notes (Signed)
Subjective/Complaints:  Patient feels a little tight at her shoulder yet, elbow feels a bit looser.  No fatigue  ROS: Patient denies chest pain, shortness of breath, nausea vomiting diarrhea constipation  Objective: Vital Signs: Blood pressure 129/72, pulse 86, temperature 98.6 F (37 C), temperature source Oral, resp. rate 18, height 5' 2"  (1.575 m), weight 67.4 kg (148 lb 9.4 oz), last menstrual period 12/10/2012, SpO2 98 %. No results found. Results for orders placed or performed during the hospital encounter of 12/09/17 (from the past 72 hour(s))  Glucose, capillary     Status: Abnormal   Collection Time: 12/14/17  9:41 PM  Result Value Ref Range   Glucose-Capillary 157 (H) 65 - 99 mg/dL  Glucose, capillary     Status: Abnormal   Collection Time: 12/15/17  6:38 AM  Result Value Ref Range   Glucose-Capillary 147 (H) 65 - 99 mg/dL  Glucose, capillary     Status: Abnormal   Collection Time: 12/15/17 11:37 AM  Result Value Ref Range   Glucose-Capillary 164 (H) 65 - 99 mg/dL  Glucose, capillary     Status: Abnormal   Collection Time: 12/15/17  4:40 PM  Result Value Ref Range   Glucose-Capillary 148 (H) 65 - 99 mg/dL  Glucose, capillary     Status: Abnormal   Collection Time: 12/15/17  9:22 PM  Result Value Ref Range   Glucose-Capillary 184 (H) 65 - 99 mg/dL  Glucose, capillary     Status: Abnormal   Collection Time: 12/16/17  6:29 AM  Result Value Ref Range   Glucose-Capillary 145 (H) 65 - 99 mg/dL  Creatinine, serum     Status: Abnormal   Collection Time: 12/16/17  7:00 AM  Result Value Ref Range   Creatinine, Ser 1.19 (H) 0.44 - 1.00 mg/dL   GFR calc non Af Amer 50 (L) >60 mL/min   GFR calc Af Amer 58 (L) >60 mL/min    Comment: (NOTE) The eGFR has been calculated using the CKD EPI equation. This calculation has not been validated in all clinical situations. eGFR's persistently <60 mL/min signify possible Chronic Kidney Disease. Performed at [facility],  [facility] [address], [place], [place] [address]   Glucose, capillary     Status: Abnormal   Collection Time: 12/16/17 11:38 AM  Result Value Ref Range   Glucose-Capillary 172 (H) 65 - 99 mg/dL  Glucose, capillary     Status: Abnormal   Collection Time: 12/16/17  4:31 PM  Result Value Ref Range   Glucose-Capillary 128 (H) 65 - 99 mg/dL  Glucose, capillary     Status: Abnormal   Collection Time: 12/16/17  9:46 PM  Result Value Ref Range   Glucose-Capillary 136 (H) 65 - 99 mg/dL  Glucose, capillary     Status: Abnormal   Collection Time: 12/17/17  6:18 AM  Result Value Ref Range   Glucose-Capillary 131 (H) 65 - 99 mg/dL  Glucose, capillary     Status: Abnormal   Collection Time: 12/17/17 12:36 PM  Result Value Ref Range   Glucose-Capillary 184 (H) 65 - 99 mg/dL  Glucose, capillary     Status: Abnormal   Collection Time: 12/17/17  4:47 PM  Result Value Ref Range   Glucose-Capillary 176 (H) 65 - 99 mg/dL     Constitutional: No distress . Vital signs reviewed. HEENT: EOMI, oral membranes moist Neck: supple Cardiovascular: RRR without murmur. No JVD    Respiratory: CTA Bilaterally without wheezes or rales. Normal effort  GI: BS +, non-tender, non-distended  Skin:   Intact Neuro: Alert/Oriented, Normal Sensory, Abnormal Motor 0/5 RUE adn RLE , 5/5 on left side and Dysarthric speech. Good insight and awareness. Tone MAS 3 at the pectoralis MAS 2 at the biceps Musc/Skel:  Normal Psych: in good spirits   Assessment/Plan: 1. Functional deficits secondary to Right hemiplegia left subcortical infarcts which require 3+ hours per day of interdisciplinary therapy in a comprehensive inpatient rehab setting. Physiatrist is providing close team supervision and 24 hour management of active medical problems listed below. Physiatrist and rehab team continue to assess barriers to discharge/monitor patient progress toward functional and medical goals. FIM: Function - Bathing Position: Shower Body  parts bathed by patient: Chest, Abdomen, Right upper leg, Left upper leg, Right arm, Right lower leg, Left lower leg, Front perineal area Body parts bathed by helper: Left arm, Buttocks, Back Assist Level: Touching or steadying assistance(Pt > 75%)  Function- Upper Body Dressing/Undressing What is the patient wearing?: Pull over shirt/dress Pull over shirt/dress - Perfomed by patient: Thread/unthread left sleeve, Thread/unthread right sleeve, Pull shirt over trunk Pull over shirt/dress - Perfomed by helper: Put head through opening Assist Level: Touching or steadying assistance(Pt > 75%) Function - Lower Body Dressing/Undressing What is the patient wearing?: Pants, Shoes, [employer], Underwear Position: Education officer, museum at [employer] - Performed by patient: Thread/unthread right underwear leg, Thread/unthread left underwear leg, Pull underwear up/down Pants- Performed by patient: Thread/unthread right pants leg, Thread/unthread left pants leg, Pull pants up/down Pants- Performed by helper: Pull pants up/down, Thread/unthread right pants leg Shoes - Performed by helper: Don/doff right shoe, Don/doff left shoe, Fasten right, Fasten left TED Hose - Performed by helper: Don/doff right TED hose, Don/doff left TED hose Assist for footwear: Maximal assist Assist for lower body dressing: Touching or steadying assistance (Pt > 75%)  Function - Toileting Toileting activity did not occur: No continent bowel/bladder event Toileting steps completed by patient: Performs perineal hygiene Toileting steps completed by helper: Adjust clothing prior to toileting, Adjust clothing after toileting Toileting Assistive Devices: Grab bar or rail Assist level: Two helpers  Function - Air cabin crew transfer assistive device: Radio broadcast assistant lift: Stedy Assist level to toilet: Maximal assist (Pt 25 - 49%/lift and lower) Assist level from toilet: Maximal assist (Pt 25 - 49%/lift and  lower)  Function - Chair/bed transfer Chair/bed transfer method: Squat pivot Chair/bed transfer assist level: Moderate assist (Pt 50 - 74%/lift or lower) Chair/bed transfer assistive device: Armrests Chair/bed transfer details: Verbal cues for sequencing, Verbal cues for technique, Manual facilitation for weight shifting, Manual facilitation for placement  Function - Locomotion: Wheelchair Type: Manual Max wheelchair distance: 100 Assist Level: Touching or steadying assistance (Pt > 75%) Assist Level: Touching or steadying assistance (Pt > 75%) Wheel 150 feet activity did not occur: Safety/medical concerns Turns around,maneuvers to table,bed, and toilet,negotiates 3% grade,maneuvers on rugs and over doorsills: No Function - Locomotion: Ambulation Assistive device: Rail in hallway Max distance: 8 ft Assist level: Maximal assist (Pt 25 - 49%) Assist level: 2 helpers Walk 50 feet with 2 turns activity did not occur: Safety/medical concerns Walk 150 feet activity did not occur: Safety/medical concerns Walk 10 feet on uneven surfaces activity did not occur: Safety/medical concerns  Function - Comprehension Comprehension: Auditory Comprehension assist level: Understands basic 90% of the time/cues < 10% of the time  Function - Expression Expression: Verbal Expression assist level: Expresses basic 75 - 89% of the time/requires cueing 10 - 24% of the  time. Needs helper to occlude trach/needs to repeat words.  Function - Social Interaction Social Interaction assist level: Interacts appropriately 90% of the time - Needs monitoring or encouragement for participation or interaction.  Function - Problem Solving Problem solving assist level: Solves basic 75 - 89% of the time/requires cueing 10 - 24% of the time  Function - Memory Memory assist level: Recognizes or recalls 75 - 89% of the time/requires cueing 10 - 24% of the time Patient normally able to recall (first 3 days only): Current  season, That he or she is in a hospital, Staff names and faces  Medical Problem List and Plan: 1.Right hemiplegia with dysarthria and dysphagiasecondary to left periventricular white matter left corona radiata infarctions as well as history of left brain infarction March 2019  -continue therapies, 2. DVT Prophylaxis/Anticoagulation: Subcutaneous Lovenox. Monitor for any bleeding episodes 3. Pain Management:Tylenol as needed 4. Mood:Provide emotional support 5. Neuropsych: This patientiscapable of making decisions on herown behalf. 6. Skin/Wound Care:Routine skin checks 7. Fluids/Electrolytes/Nutrition: encourage PO  -BUN was trending up, encourage p.o. will not start IV fluids at this time, fluid intake 12/16/2017 was 300 mL 8.Dysphagia. Dysphagia #2 thin liquids. Follow-up speech therapy 9.Hypertension. Norvasc 5 mg daily, lisinopril 40 mg daily Vitals:   12/17/17 0514 12/17/17 0847  BP: 129/72   Pulse: 86   Resp: 18   Temp: 98.6 F (37 C)   SpO2: 100% 98%   controlled 5/24 10.Diabetes mellitus with peripheral neuropathy. Hemoglobin A1c 8.4. Sliding scale insulin. Check blood sugars before meals and at bedtime. Patient on DiaBeta 2.5 mg daily, insulin glargine 10 units nightly. Resume as needed CBG (last 3)  Recent Labs    12/17/17 0618 12/17/17 1236 12/17/17 1647  GLUCAP 131* 184* 176*  excellent in hospital control 5/24 11.Hyperlipidemia. Crestor 12.COPD with tobacco abuse. NicoDerm patch. Provide counseling. Inhalers as directed. 13.5 mm right upper lobe nodule with nonspecific mildly enlarged mediastinal bilateral hilar lymph nodes per CT of the chest 12/08/2017. Recommendations are for noncontrast chest CT 12 months for evaluation 14.  HypoK mild likely nutritional started KCL 25mq 5/23 15.  Spasticity will start low-dose tizanidine continue PT and OT LOS (Days) 8 A FACE TO FACE EVALUATION WAS PERFORMED  ACharlett Blake5/24/2019,  7:06 PM

## 2017-12-17 NOTE — Progress Notes (Signed)
Physical Therapy Session Note  Patient Details  Name: Kelly Gibson MRN: [id number] Date of Birth: 06/11/1961  Today's Date: 12/17/2017 PT Individual Time: 0900-0930 PT Individual Time Calculation (min): 30 min   Short Term Goals: Week 2:  PT Short Term Goal 1 (Week 2): Pt will propel w/c 50 ft with supervision. PT Short Term Goal 2 (Week 2): Pt will complete bed<>w/c transfers with min assist.  PT Short Term Goal 3 (Week 2): Pt will ambulate 30 ft with max assist +1 for NMR & strengthening.  Skilled Therapeutic Interventions/Progress Updates:    Pt seated in w/c upon PT arrival, agreeable to therapy tx and denies pain. Pt transported to gym. Pt performed squat pivot transfer from w/c>mat with mod assist, verbal cues for weightshifting and techniques. Pt performed sit<>stand with mod assist, once in standing worked on postural control and maintaining midline while tossing horse shoes, min assist, x 2 trials. Pt worked on pre-gait task to perform stepping forward/back in place with each LE, able to perform x 20 steps with L LE while therapist provided manual assist for lateral weightshifting, performed x 3 steps with R LE and total assist to advance R LE. Pt transported back to room and left seated in w/c with chair alarm set, QRB in place and needs in reach.  Therapy Documentation Precautions:  Precautions Precautions: Fall Precaution Comments: bradycardia Restrictions Weight Bearing Restrictions: No RUE Weight Bearing: Weight bearing as tolerated RLE Weight Bearing: Weight bearing as tolerated   See Function Navigator for Current Functional Status.   Therapy/Group: Individual Therapy  Netta Corrigan, PT, DPT 12/17/2017, 7:48 AM

## 2017-12-17 NOTE — Progress Notes (Signed)
Physical Therapy Session Note  Patient Details  Name: Kelly Gibson MRN: [id number] Date of Birth: 1961/04/28  Today's Date: 12/17/2017 PT Individual Time: 0881-1031 PT Individual Time Calculation (min): 56 min   Short Term Goals: Week 2:  PT Short Term Goal 1 (Week 2): Pt will propel w/c 50 ft with supervision. PT Short Term Goal 2 (Week 2): Pt will complete bed<>w/c transfers with min assist.  PT Short Term Goal 3 (Week 2): Pt will ambulate 30 ft with max assist +1 for NMR & strengthening.  Skilled Therapeutic Interventions/Progress Updates:  Pt received asleep in w/c but easily awakened & agreeable to tx. No c/o pain reported. Transported pt to hallway by gym and focused on sit>stand transfers with max cuing for hand placement to push to standing. While standing at rail pt performed LLE stepping to increase weight bearing through RLE to help decrease RLE extensor tone. Pt with improved ability to clear LLE on this date. Progressed to gait with rail for 8 ft x 2 trials with therapist providing max assist for RLE advancement. Pt able to initiate R knee flexion x 2 steps but then unable to initiate and therapist with great difficulty facilitating hip/knee flexion 2/2 tone. Pt transferred w/c<>mat table via squat pivot with mod assist with cuing to decrease speed of movement and increase safety instead of rushing through transfer; pt completes transfer with mod assist. While sitting EOM pt completed sit>stand transfers from various height surfaces with max progressing to mod assist with cuing to place LUE on RLE and shift weight anteriorly/to R to facilitate increased weight bearing through RLE for NMR. Pt with improving ability to power up, extend hips and shift pelvis anteriorly to come to upright standing. At end of session pt left sitting in w/c in room with quick release belt & all needs within reach.   Therapy Documentation Precautions:  Precautions Precautions: Fall Precaution Comments:  bradycardia Restrictions Weight Bearing Restrictions: No RUE Weight Bearing: Weight bearing as tolerated RLE Weight Bearing: Weight bearing as tolerated   See Function Navigator for Current Functional Status.   Therapy/Group: Individual Therapy  Waunita Schooner 12/17/2017, 4:43 PM

## 2017-12-18 ENCOUNTER — Other Ambulatory Visit: Payer: Self-pay

## 2017-12-18 ENCOUNTER — Inpatient Hospital Stay (HOSPITAL_COMMUNITY): Payer: BLUE CROSS/BLUE SHIELD | Admitting: Occupational Therapy

## 2017-12-18 ENCOUNTER — Inpatient Hospital Stay (HOSPITAL_COMMUNITY): Payer: BLUE CROSS/BLUE SHIELD

## 2017-12-18 LAB — GLUCOSE, CAPILLARY
GLUCOSE-CAPILLARY: 123 mg/dL — AB (ref 65–99)
GLUCOSE-CAPILLARY: 202 mg/dL — AB (ref 65–99)
Glucose-Capillary: 178 mg/dL — ABNORMAL HIGH (ref 65–99)
Glucose-Capillary: 142 mg/dL — ABNORMAL HIGH (ref 65–99)

## 2017-12-18 MED ORDER — TIZANIDINE HCL 2 MG PO TABS
2.0000 mg | ORAL_TABLET | Freq: Four times a day (QID) | ORAL | Status: DC
  Administered 2017-12-18 – 2017-12-20 (×8): 2 mg via ORAL
  Filled 2017-12-18 (×8): qty 1

## 2017-12-18 NOTE — Progress Notes (Signed)
Occupational Therapy Session Note  Patient Details  Name: Kelly Gibson MRN: [id number] Date of Birth: 05/01/1961  Today's Date: 12/18/2017 OT Individual Time: 1030-1056 OT Individual Time Calculation (min): 26 min    Short Term Goals: Week 1:  OT Short Term Goal 1 (Week 1): Pt will be able to maintain static sitting EOB with S.  OT Short Term Goal 1 - Progress (Week 1): Met OT Short Term Goal 2 (Week 1): Pt will be able to sit at EOB with min A for UB self care with min A.  OT Short Term Goal 2 - Progress (Week 1): Progressing toward goal OT Short Term Goal 3 (Week 1): Pt will be able to transfer to [facility] with mod A of 1. OT Short Term Goal 3 - Progress (Week 1): Met OT Short Term Goal 4 (Week 1): Pt will be able to maintain static stand with mod A to allow caregiver to assist pt with clothing management.  OT Short Term Goal 4 - Progress (Week 1): Met OT Short Term Goal 5 (Week 1): Pt will be able to use L hand to support RUE during bed mobility and transfers.  OT Short Term Goal 5 - Progress (Week 1): Met  Skilled Therapeutic Interventions/Progress Updates:    1;1. No c/o pain. Pt completes stand pivot transfer with MOD A and R knee block with Vc for  Hand placement. Pt completes bean bag toss 2 rounds 15 bean bags for dynamic reaching/sitting balance/ lengthening of trunk to facilitate weight bearing onto RUE with hand placed on mat for one round and forearm placed on block for second round. Pt with 3 total LOB R/backwards however pt able to recover with min A to engage core. Exited session with pt seated in w/c, call light in reach, QRB donned and all needs met  Therapy Documentation Precautions:  Precautions Precautions: Fall Precaution Comments: bradycardia Restrictions Weight Bearing Restrictions: No RUE Weight Bearing: Weight bearing as tolerated RLE Weight Bearing: Weight bearing as tolerated General:   Vital Signs: Oxygen Therapy SpO2: 99 % O2 Device: Room  Air Pain: Pain Assessment Pain Scale: 0-10 Pain Score: 0-No pain ADL:  See Function Navigator for Current Functional Status.   Therapy/Group: Individual Therapy  [name] 12/18/2017, 10:59 AM

## 2017-12-18 NOTE — Progress Notes (Signed)
Occupational Therapy Session Note  Patient Details  Name: Kelly Gibson MRN: [id number] Date of Birth: 04/25/1961  Today's Date: 12/18/2017 OT Individual Time: 1400-1430 OT Individual Time Calculation (min): 30 min    Short Term Goals: Week 1:  OT Short Term Goal 1 (Week 1): Pt will be able to maintain static sitting EOB with S.  OT Short Term Goal 1 - Progress (Week 1): Met OT Short Term Goal 2 (Week 1): Pt will be able to sit at EOB with min A for UB self care with min A.  OT Short Term Goal 2 - Progress (Week 1): Progressing toward goal OT Short Term Goal 3 (Week 1): Pt will be able to transfer to BSC with mod A of 1. OT Short Term Goal 3 - Progress (Week 1): Met OT Short Term Goal 4 (Week 1): Pt will be able to maintain static stand with mod A to allow caregiver to assist pt with clothing management.  OT Short Term Goal 4 - Progress (Week 1): Met OT Short Term Goal 5 (Week 1): Pt will be able to use L hand to support RUE during bed mobility and transfers.  OT Short Term Goal 5 - Progress (Week 1): Met Week 2:  OT Short Term Goal 1 (Week 2): Pt will complete 2/3 toileting tasks with mod steadying assist in order to reduce caregiver burden OT Short Term Goal 2 (Week 2): Pt will don shirt with min A OT Short Term Goal 3 (Week 2): Pt will thread B LEs into pants with steadying assist OT Short Term Goal 4 (Week 2): Pt will recall and utilize hemi dressing technique mod I     Skilled Therapeutic Interventions/Progress Updates:    Pt received in w/c in slumped position looking fatigued. Pt stated she was very sleepy possibly due to medicine. Pt requested a wc cushion change as she did not like how the gel cushion felt.  Switched out to a basic w/c cushion and pt felt this was more comfortable.   She stated that she had been up for 4 hrs and was ready to lay.  Worked on sq pivot to L side to bed, sitting at EOB with R UE weight bearing while provide MFR to low neck muscles.  Demonstrate to  pt how slumped posture and forward head position can strain neck muscles.  Pt worked on upright dynamic sit and encouraged her to do this throughout the day. Assisted pt into supine. Pt resting in bed with R arm support, all needs met, bed alarm on.   Therapy Documentation Precautions:  Precautions Precautions: Fall Precaution Comments: bradycardia Restrictions Weight Bearing Restrictions: No RUE Weight Bearing: Weight bearing as tolerated RLE Weight Bearing: Weight bearing as tolerated    Vital Signs: Therapy Vitals Temp: 98 F (36.7 C) Temp Source: Oral Pulse Rate: 90 Resp: 17 BP: 126/84 Patient Position (if appropriate): Sitting Oxygen Therapy SpO2: 90 % O2 Device: Room Air Pain:  c/o low neck pain ADL: ADL ADL Comments: refer to functional navigator  See Function Navigator for Current Functional Status.   Therapy/Group: Individual Therapy  SAGUIER,JULIA 12/18/2017, 4:01 PM 

## 2017-12-18 NOTE — Progress Notes (Addendum)
Subjective/Complaints:  No issues overnite  ROS: Patient denies chest pain, shortness of breath, nausea vomiting diarrhea constipation  Objective: Vital Signs: Blood pressure (!) 124/59, pulse 82, temperature 97.9 F (36.6 C), temperature source Oral, resp. rate 14, height 5' 2"  (1.575 m), weight 67.4 kg (148 lb 9.4 oz), last menstrual period 12/10/2012, SpO2 99 %. No results found. Results for orders placed or performed during the hospital encounter of 12/09/17 (from the past 72 hour(s))  Glucose, capillary     Status: Abnormal   Collection Time: 12/15/17 11:37 AM  Result Value Ref Range   Glucose-Capillary 164 (H) 65 - 99 mg/dL  Glucose, capillary     Status: Abnormal   Collection Time: 12/15/17  4:40 PM  Result Value Ref Range   Glucose-Capillary 148 (H) 65 - 99 mg/dL  Glucose, capillary     Status: Abnormal   Collection Time: 12/15/17  9:22 PM  Result Value Ref Range   Glucose-Capillary 184 (H) 65 - 99 mg/dL  Glucose, capillary     Status: Abnormal   Collection Time: 12/16/17  6:29 AM  Result Value Ref Range   Glucose-Capillary 145 (H) 65 - 99 mg/dL  Creatinine, serum     Status: Abnormal   Collection Time: 12/16/17  7:00 AM  Result Value Ref Range   Creatinine, Ser 1.19 (H) 0.44 - 1.00 mg/dL   GFR calc non Af Amer 50 (L) >60 mL/min   GFR calc Af Amer 58 (L) >60 mL/min    Comment: (NOTE) The eGFR has been calculated using the CKD EPI equation. This calculation has not been validated in all clinical situations. eGFR's persistently <60 mL/min signify possible Chronic Kidney Disease. Performed at [facility], [place] [address] [place], [place] [address]   Glucose, capillary     Status: Abnormal   Collection Time: 12/16/17 11:38 AM  Result Value Ref Range   Glucose-Capillary 172 (H) 65 - 99 mg/dL  Glucose, capillary     Status: Abnormal   Collection Time: 12/16/17  4:31 PM  Result Value Ref Range   Glucose-Capillary 128 (H) 65 - 99 mg/dL  Glucose, capillary      Status: Abnormal   Collection Time: 12/16/17  9:46 PM  Result Value Ref Range   Glucose-Capillary 136 (H) 65 - 99 mg/dL  Glucose, capillary     Status: Abnormal   Collection Time: 12/17/17  6:18 AM  Result Value Ref Range   Glucose-Capillary 131 (H) 65 - 99 mg/dL  Glucose, capillary     Status: Abnormal   Collection Time: 12/17/17 12:36 PM  Result Value Ref Range   Glucose-Capillary 184 (H) 65 - 99 mg/dL  Glucose, capillary     Status: Abnormal   Collection Time: 12/17/17  4:47 PM  Result Value Ref Range   Glucose-Capillary 176 (H) 65 - 99 mg/dL  Glucose, capillary     Status: Abnormal   Collection Time: 12/17/17  9:43 PM  Result Value Ref Range   Glucose-Capillary 165 (H) 65 - 99 mg/dL  Glucose, capillary     Status: Abnormal   Collection Time: 12/18/17  6:30 AM  Result Value Ref Range   Glucose-Capillary 123 (H) 65 - 99 mg/dL     Constitutional: No distress . Vital signs reviewed. HEENT: EOMI, oral membranes moist Neck: supple Cardiovascular: RRR without murmur. No JVD    Respiratory: CTA Bilaterally without wheezes or rales. Normal effort    GI: BS +, non-tender, non-distended  Skin:   Intact Neuro: Alert/Oriented,  Normal Sensory, Abnormal Motor 0/5 RUE adn RLE , 5/5 on left side and Dysarthric speech. Good insight and awareness. Tone MAS 3 at the pectoralis MAS 2 at the biceps Musc/Skel:  Normal Psych: in good spirits   Assessment/Plan: 1. Functional deficits secondary to Right hemiplegia left subcortical infarcts which require 3+ hours per day of interdisciplinary therapy in a comprehensive inpatient rehab setting. Physiatrist is providing close team supervision and 24 hour management of active medical problems listed below. Physiatrist and rehab team continue to assess barriers to discharge/monitor patient progress toward functional and medical goals. FIM: Function - Bathing Position: Shower Body parts bathed by patient: Chest, Abdomen, Right upper leg, Left upper  leg, Right arm, Right lower leg, Left lower leg, Front perineal area Body parts bathed by helper: Left arm, Buttocks, Back Assist Level: Touching or steadying assistance(Pt > 75%)  Function- Upper Body Dressing/Undressing What is the patient wearing?: Pull over shirt/dress Pull over shirt/dress - Perfomed by patient: Thread/unthread left sleeve, Thread/unthread right sleeve, Pull shirt over trunk Pull over shirt/dress - Perfomed by helper: Put head through opening Assist Level: Touching or steadying assistance(Pt > 75%) Function - Lower Body Dressing/Undressing What is the patient wearing?: Pants, Shoes, [employer], Underwear Position: Education officer, museum at [employer] - Performed by patient: Thread/unthread right underwear leg, Thread/unthread left underwear leg, Pull underwear up/down Pants- Performed by patient: Thread/unthread right pants leg, Thread/unthread left pants leg, Pull pants up/down Pants- Performed by helper: Pull pants up/down, Thread/unthread right pants leg Shoes - Performed by helper: Don/doff right shoe, Don/doff left shoe, Fasten right, Fasten left TED Hose - Performed by helper: Don/doff right TED hose, Don/doff left TED hose Assist for footwear: Maximal assist Assist for lower body dressing: Touching or steadying assistance (Pt > 75%)  Function - Toileting Toileting activity did not occur: No continent bowel/bladder event Toileting steps completed by patient: Performs perineal hygiene Toileting steps completed by helper: Adjust clothing prior to toileting, Adjust clothing after toileting Toileting Assistive Devices: Grab bar or rail Assist level: Two helpers  Function - Air cabin crew transfer assistive device: Radio broadcast assistant lift: Stedy Assist level to toilet: Maximal assist (Pt 25 - 49%/lift and lower) Assist level from toilet: Maximal assist (Pt 25 - 49%/lift and lower)  Function - Chair/bed transfer Chair/bed transfer method: Squat  pivot Chair/bed transfer assist level: Moderate assist (Pt 50 - 74%/lift or lower) Chair/bed transfer assistive device: Armrests Chair/bed transfer details: Verbal cues for sequencing, Verbal cues for technique, Manual facilitation for weight shifting, Manual facilitation for placement  Function - Locomotion: Wheelchair Type: Manual Max wheelchair distance: 100 Assist Level: Touching or steadying assistance (Pt > 75%) Assist Level: Touching or steadying assistance (Pt > 75%) Wheel 150 feet activity did not occur: Safety/medical concerns Turns around,maneuvers to table,bed, and toilet,negotiates 3% grade,maneuvers on rugs and over doorsills: No Function - Locomotion: Ambulation Assistive device: Rail in hallway Max distance: 8 ft Assist level: Maximal assist (Pt 25 - 49%) Assist level: 2 helpers Walk 50 feet with 2 turns activity did not occur: Safety/medical concerns Walk 150 feet activity did not occur: Safety/medical concerns Walk 10 feet on uneven surfaces activity did not occur: Safety/medical concerns  Function - Comprehension Comprehension: Auditory Comprehension assist level: Understands basic 90% of the time/cues < 10% of the time  Function - Expression Expression: Verbal Expression assist level: Expresses basic 75 - 89% of the time/requires cueing 10 - 24% of the time. Needs helper to occlude trach/needs to repeat words.  Function -  Social Interaction Social Interaction assist level: Interacts appropriately 90% of the time - Needs monitoring or encouragement for participation or interaction.  Function - Problem Solving Problem solving assist level: Solves basic 75 - 89% of the time/requires cueing 10 - 24% of the time  Function - Memory Memory assist level: Recognizes or recalls 75 - 89% of the time/requires cueing 10 - 24% of the time Patient normally able to recall (first 3 days only): Current season, That he or she is in a hospital, Staff names and faces  Medical  Problem List and Plan: 1.Right hemiplegia with dysarthria and dysphagiasecondary to left periventricular white matter left corona radiata infarctions as well as history of left brain infarction March 2019  -continue therapies, 2. DVT Prophylaxis/Anticoagulation: Subcutaneous Lovenox. Monitor for any bleeding episodes 3. Pain Management:Tylenol as needed 4. Mood:Provide emotional support 5. Neuropsych: This patientiscapable of making decisions on herown behalf. 6. Skin/Wound Care:Routine skin checks 7. Fluids/Electrolytes/Nutrition: encourage PO  -BUN was trending up, encourage p.o. will not start IV fluids at this time, fluid intake 12/16/2017 was 300 mL.  Recheck BMET in am 8.Dysphagia. Dysphagia #2 thin liquids. Follow-up speech therapy 9.Hypertension. Norvasc 5 mg daily, lisinopril 40 mg daily Vitals:   12/18/17 0528 12/18/17 0908  BP: (!) 124/59   Pulse: 82   Resp: 14   Temp: 97.9 F (36.6 C)   SpO2: 98% 99%   controlled 5/25 10.Diabetes mellitus with peripheral neuropathy. Hemoglobin A1c 8.4. Sliding scale insulin. Check blood sugars before meals and at bedtime. Patient on DiaBeta 2.5 mg daily, insulin glargine 10 units nightly. Resume as needed CBG (last 3)  Recent Labs    12/17/17 1647 12/17/17 2143 12/18/17 0630  GLUCAP 176* 165* 123*  excellent in hospital control 5/25 11.Hyperlipidemia. Crestor 12.COPD with tobacco abuse. NicoDerm patch. Provide counseling. Inhalers as directed. 13.5 mm right upper lobe nodule with nonspecific mildly enlarged mediastinal bilateral hilar lymph nodes per CT of the chest 12/08/2017. Recommendations are for noncontrast chest CT 12 months for evaluation 14.  HypoK mild likely nutritional started KCL 67mq 5/23 15.  Spasticity will start low-dose tizanidine continue PT and OT- no evidence of sedation, increase to QID LOS (Days) 9 A FACE TO FACE EVALUATION WAS PERFORMED  ACharlett Blake5/25/2019, 9:49 AM

## 2017-12-19 ENCOUNTER — Inpatient Hospital Stay (HOSPITAL_COMMUNITY): Payer: BLUE CROSS/BLUE SHIELD | Admitting: Occupational Therapy

## 2017-12-19 LAB — BASIC METABOLIC PANEL
ANION GAP: 9 (ref 5–15)
BUN: 24 mg/dL — ABNORMAL HIGH (ref 6–20)
CALCIUM: 9 mg/dL (ref 8.9–10.3)
CO2: 23 mmol/L (ref 22–32)
CREATININE: 1.46 mg/dL — AB (ref 0.44–1.00)
Chloride: 105 mmol/L (ref 101–111)
GFR, EST AFRICAN AMERICAN: 46 mL/min — AB (ref >60)
GFR, EST NON AFRICAN AMERICAN: 39 mL/min — AB (ref >60)
GLUCOSE: 127 mg/dL — AB (ref 65–99)
Potassium: 3.2 mmol/L — ABNORMAL LOW (ref 3.5–5.1)
Sodium: 137 mmol/L (ref 135–145)

## 2017-12-19 LAB — GLUCOSE, CAPILLARY
GLUCOSE-CAPILLARY: 178 mg/dL — AB (ref 65–99)
GLUCOSE-CAPILLARY: 149 mg/dL — AB (ref 65–99)
Glucose-Capillary: 115 mg/dL — ABNORMAL HIGH (ref 65–99)
Glucose-Capillary: 196 mg/dL — ABNORMAL HIGH (ref 65–99)

## 2017-12-19 MED ORDER — POTASSIUM CHLORIDE CRYS ER 20 MEQ PO TBCR
20.0000 meq | EXTENDED_RELEASE_TABLET | Freq: Every day | ORAL | Status: DC
  Administered 2017-12-20 – 2017-12-30 (×11): 20 meq via ORAL
  Filled 2017-12-19 (×11): qty 1

## 2017-12-19 NOTE — Progress Notes (Signed)
Subjective/Complaints:  No pains, spasms ok no other c/os  ROS: Patient denies chest pain, shortness of breath, nausea vomiting diarrhea constipation  Objective: Vital Signs: Blood pressure (!) 136/98, pulse 86, temperature 98 F (36.7 C), temperature source Oral, resp. rate 18, height 5' 2"  (1.575 m), weight 67.4 kg (148 lb 9.4 oz), last menstrual period 12/10/2012, SpO2 96 %. No results found. Results for orders placed or performed during the hospital encounter of 12/09/17 (from the past 72 hour(s))  Glucose, capillary     Status: Abnormal   Collection Time: 12/16/17 11:38 AM  Result Value Ref Range   Glucose-Capillary 172 (H) 65 - 99 mg/dL  Glucose, capillary     Status: Abnormal   Collection Time: 12/16/17  4:31 PM  Result Value Ref Range   Glucose-Capillary 128 (H) 65 - 99 mg/dL  Glucose, capillary     Status: Abnormal   Collection Time: 12/16/17  9:46 PM  Result Value Ref Range   Glucose-Capillary 136 (H) 65 - 99 mg/dL  Glucose, capillary     Status: Abnormal   Collection Time: 12/17/17  6:18 AM  Result Value Ref Range   Glucose-Capillary 131 (H) 65 - 99 mg/dL  Glucose, capillary     Status: Abnormal   Collection Time: 12/17/17 12:36 PM  Result Value Ref Range   Glucose-Capillary 184 (H) 65 - 99 mg/dL  Glucose, capillary     Status: Abnormal   Collection Time: 12/17/17  4:47 PM  Result Value Ref Range   Glucose-Capillary 176 (H) 65 - 99 mg/dL  Glucose, capillary     Status: Abnormal   Collection Time: 12/17/17  9:43 PM  Result Value Ref Range   Glucose-Capillary 165 (H) 65 - 99 mg/dL  Glucose, capillary     Status: Abnormal   Collection Time: 12/18/17  6:30 AM  Result Value Ref Range   Glucose-Capillary 123 (H) 65 - 99 mg/dL  Glucose, capillary     Status: Abnormal   Collection Time: 12/18/17 11:48 AM  Result Value Ref Range   Glucose-Capillary 202 (H) 65 - 99 mg/dL  Glucose, capillary     Status: Abnormal   Collection Time: 12/18/17  5:04 PM  Result Value Ref  Range   Glucose-Capillary 178 (H) 65 - 99 mg/dL  Glucose, capillary     Status: Abnormal   Collection Time: 12/18/17  9:47 PM  Result Value Ref Range   Glucose-Capillary 142 (H) 65 - 99 mg/dL  Basic metabolic panel     Status: Abnormal   Collection Time: 12/19/17  4:53 AM  Result Value Ref Range   Sodium 137 135 - 145 mmol/L   Potassium 3.2 (L) 3.5 - 5.1 mmol/L   Chloride 105 101 - 111 mmol/L   CO2 23 22 - 32 mmol/L   Glucose, Bld 127 (H) 65 - 99 mg/dL   BUN 24 (H) 6 - 20 mg/dL   Creatinine, Ser 1.46 (H) 0.44 - 1.00 mg/dL   Calcium 9.0 8.9 - 10.3 mg/dL   GFR calc non Af Amer 39 (L) >60 mL/min   GFR calc Af Amer 46 (L) >60 mL/min    Comment: (NOTE) The eGFR has been calculated using the CKD EPI equation. This calculation has not been validated in all clinical situations. eGFR's persistently <60 mL/min signify possible Chronic Kidney Disease.    Anion gap 9 5 - 15    Comment: Performed at [facility] [address] [place], [place] [address]  Glucose, capillary  Status: Abnormal   Collection Time: 12/19/17  6:39 AM  Result Value Ref Range   Glucose-Capillary 115 (H) 65 - 99 mg/dL     Constitutional: No distress . Vital signs reviewed. HEENT: EOMI, oral membranes moist Neck: supple Cardiovascular: RRR without murmur. No JVD    Respiratory: CTA Bilaterally without wheezes or rales. Normal effort    GI: BS +, non-tender, non-distended  Skin:   Intact Neuro: Alert/Oriented, Normal Sensory, Abnormal Motor 0/5 RUE adn RLE , 5/5 on left side and Dysarthric speech. Good insight and awareness. Tone MAS 3 at the pectoralis MAS 2 at the biceps Musc/Skel:  Normal Psych: in good spirits   Assessment/Plan: 1. Functional deficits secondary to Right hemiplegia left subcortical infarcts which require 3+ hours per day of interdisciplinary therapy in a comprehensive inpatient rehab setting. Physiatrist is providing close team supervision and 24 hour management of active medical  problems listed below. Physiatrist and rehab team continue to assess barriers to discharge/monitor patient progress toward functional and medical goals. FIM: Function - Bathing Position: Shower Body parts bathed by patient: Chest, Abdomen, Right upper leg, Left upper leg, Right arm, Right lower leg, Left lower leg, Front perineal area Body parts bathed by helper: Left arm, Buttocks, Back Assist Level: Touching or steadying assistance(Pt > 75%)  Function- Upper Body Dressing/Undressing What is the patient wearing?: Pull over shirt/dress Pull over shirt/dress - Perfomed by patient: Thread/unthread left sleeve, Thread/unthread right sleeve, Pull shirt over trunk Pull over shirt/dress - Perfomed by helper: Put head through opening Assist Level: Touching or steadying assistance(Pt > 75%) Function - Lower Body Dressing/Undressing What is the patient wearing?: Pants, Shoes, [employer], Underwear Position: Education officer, museum at [employer] - Performed by patient: Thread/unthread right underwear leg, Thread/unthread left underwear leg, Pull underwear up/down Pants- Performed by patient: Thread/unthread right pants leg, Thread/unthread left pants leg, Pull pants up/down Pants- Performed by helper: Pull pants up/down, Thread/unthread right pants leg Shoes - Performed by helper: Don/doff right shoe, Don/doff left shoe, Fasten right, Fasten left TED Hose - Performed by helper: Don/doff right TED hose, Don/doff left TED hose Assist for footwear: Maximal assist Assist for lower body dressing: Touching or steadying assistance (Pt > 75%)  Function - Toileting Toileting activity did not occur: No continent bowel/bladder event Toileting steps completed by patient: Performs perineal hygiene Toileting steps completed by helper: Adjust clothing prior to toileting, Adjust clothing after toileting Toileting Assistive Devices: Grab bar or rail Assist level: Two helpers  Function - Air cabin crew  transfer assistive device: Radio broadcast assistant lift: Stedy Assist level to toilet: Maximal assist (Pt 25 - 49%/lift and lower) Assist level from toilet: Maximal assist (Pt 25 - 49%/lift and lower)  Function - Chair/bed transfer Chair/bed transfer method: Squat pivot Chair/bed transfer assist level: Moderate assist (Pt 50 - 74%/lift or lower) Chair/bed transfer assistive device: Armrests Chair/bed transfer details: Verbal cues for sequencing, Verbal cues for technique, Manual facilitation for weight shifting, Manual facilitation for placement  Function - Locomotion: Wheelchair Type: Manual Max wheelchair distance: 100 Assist Level: Touching or steadying assistance (Pt > 75%) Assist Level: Touching or steadying assistance (Pt > 75%) Wheel 150 feet activity did not occur: Safety/medical concerns Turns around,maneuvers to table,bed, and toilet,negotiates 3% grade,maneuvers on rugs and over doorsills: No Function - Locomotion: Ambulation Assistive device: Rail in hallway Max distance: 8 ft Assist level: Maximal assist (Pt 25 - 49%) Assist level: 2 helpers Walk 50 feet with 2 turns activity did not occur: Safety/medical concerns Walk  150 feet activity did not occur: Safety/medical concerns Walk 10 feet on uneven surfaces activity did not occur: Safety/medical concerns  Function - Comprehension Comprehension: Auditory Comprehension assist level: Understands basic 90% of the time/cues < 10% of the time  Function - Expression Expression: Verbal Expression assist level: Expresses basic 75 - 89% of the time/requires cueing 10 - 24% of the time. Needs helper to occlude trach/needs to repeat words.  Function - Social Interaction Social Interaction assist level: Interacts appropriately 90% of the time - Needs monitoring or encouragement for participation or interaction.  Function - Problem Solving Problem solving assist level: Solves basic 75 - 89% of the time/requires cueing 10 - 24% of the  time  Function - Memory Memory assist level: Recognizes or recalls 75 - 89% of the time/requires cueing 10 - 24% of the time Patient normally able to recall (first 3 days only): Current season, That he or she is in a hospital, Staff names and faces  Medical Problem List and Plan: 1.Right hemiplegia with dysarthria and dysphagiasecondary to left periventricular white matter left corona radiata infarctions as well as history of left brain infarction March 2019  -continue therapies, 2. DVT Prophylaxis/Anticoagulation: Subcutaneous Lovenox. Monitor for any bleeding episodes 3. Pain Management:Tylenol as needed 4. Mood:Provide emotional support 5. Neuropsych: This patientiscapable of making decisions on herown behalf. 6. Skin/Wound Care:Routine skin checks 7. Fluids/Electrolytes/Nutrition: encourage PO  -BUN was trending up, encourage p.o. will not start IV fluids at this time, fluid intake 12/16/2017 was 300 mL.  Recheck BMET  8.Dysphagia. Dysphagia #2 thin liquids. Follow-up speech therapy 9.Hypertension. Norvasc 5 mg daily, lisinopril 40 mg daily Vitals:   12/19/17 0559 12/19/17 0743  BP: (!) 136/98   Pulse: 86   Resp: 18   Temp:    SpO2:  96%   elevated diastolic 5/91-[id number], increase amlodipine to 60m 10.Diabetes mellitus with peripheral neuropathy. Hemoglobin A1c 8.4. Sliding scale insulin. Check blood sugars before meals and at bedtime. Patient on DiaBeta 2.5 mg daily, insulin glargine 10 units nightly. Resume as needed CBG (last 3)  Recent Labs    12/18/17 1704 12/18/17 2147 12/19/17 0639  GLUCAP 178* 142* 115*  excellent in hospital control 5/26 11.Hyperlipidemia. Crestor 12.COPD with tobacco abuse. NicoDerm patch. Provide counseling. Inhalers as directed. 13.5 mm right upper lobe nodule with nonspecific mildly enlarged mediastinal bilateral hilar lymph nodes per CT of the chest 12/08/2017. Recommendations are for noncontrast chest CT 12  months for evaluation 14.  HypoK mild likely nutritional started KCL 184m 5/23, still low increase to 2023m15.  Spasticity will start low-dose tizanidine continue PT and OT- no evidence of sedation, increase to QID 16.  ARF- D/C ACE and Monitor, enc fluids LOS (Days) 10 A FACE TO FACE EVALUATION WAS PERFORMED  AndCharlett Blake26/2019, 9:39 AM

## 2017-12-19 NOTE — Progress Notes (Signed)
Occupational Therapy Session Note  Patient Details  Name: Kelly Gibson MRN: [id number] Date of Birth: 10/22/1960  Today's Date: 12/19/2017 OT Individual Time: 4665-9935 OT Individual Time Calculation (min): 46 min    Short Term Goals: Week 2:  OT Short Term Goal 1 (Week 2): Pt will complete 2/3 toileting tasks with mod steadying assist in order to reduce caregiver burden OT Short Term Goal 2 (Week 2): Pt will don shirt with min A OT Short Term Goal 3 (Week 2): Pt will thread B LEs into pants with steadying assist OT Short Term Goal 4 (Week 2): Pt will recall and utilize hemi dressing technique mod I  Skilled Therapeutic Interventions/Progress Updates:    Pt seen for OT session focusing on sit<> stand and UE ROM for tone reduction. Pt sitting up in w/c upon arrival eating lunch with daughter present. Pt agreeable to tx session and denying pain.  Pt repositioned with lunch to facilitate positioning of R UE for attention and weightbearing, education provided regarding purpose and benefits of reposition. Pt ate lunch with supervision, able to locate all needed items on meal tray independently.  Following lunch, completed sit>stand at sink for weightbearing through R UE/LE. Pt able to maintain static standing balance with B UE support on sink ledge and min-mod A. Observed hyper-extended knee with pt unable to release. She completed mod A squat pivot transfer to sitting EOB. Completed sit>stand from EOB with manual facilitation for stability at R knee, multimodal cuing for erect posture and R LE activation- completed x3 trials. During seated rest break, R UE ROM performed. Noted increased flexor tone in all major muscle groups.  Pt returned to w/c at end of session, and requesting daughter to take her off unit.   Therapy Documentation Precautions:  Precautions Precautions: Fall Precaution Comments: bradycardia Restrictions Weight Bearing Restrictions: No RUE Weight Bearing: Weight bearing  as tolerated RLE Weight Bearing: Weight bearing as tolerated Pain:   No/denies pain ADL: ADL ADL Comments: refer to functional navigator  See Function Navigator for Current Functional Status.   Therapy/Group: Individual Therapy  Kinslee Dalpe L 12/19/2017, 7:09 AM

## 2017-12-19 NOTE — Plan of Care (Signed)
  Problem: Consults Goal: RH STROKE PATIENT EDUCATION Description See Patient Education module for education specifics  Outcome: Progressing   Problem: RH BLADDER ELIMINATION Goal: RH STG MANAGE BLADDER WITH ASSISTANCE Description STG Manage Bladder With Min Assistance  Outcome: Progressing   Problem: RH SAFETY Goal: RH STG DECREASED RISK OF FALL WITH ASSISTANCE Description STG Decreased Risk of Fall With Assistance. Cues and reminders.  Outcome: Progressing

## 2017-12-20 ENCOUNTER — Inpatient Hospital Stay (HOSPITAL_COMMUNITY): Payer: BLUE CROSS/BLUE SHIELD | Admitting: Speech Pathology

## 2017-12-20 ENCOUNTER — Inpatient Hospital Stay (HOSPITAL_COMMUNITY): Payer: BLUE CROSS/BLUE SHIELD | Admitting: Occupational Therapy

## 2017-12-20 ENCOUNTER — Inpatient Hospital Stay (HOSPITAL_COMMUNITY): Payer: BLUE CROSS/BLUE SHIELD | Admitting: Physical Therapy

## 2017-12-20 LAB — GLUCOSE, CAPILLARY
GLUCOSE-CAPILLARY: 132 mg/dL — AB (ref 65–99)
Glucose-Capillary: 125 mg/dL — ABNORMAL HIGH (ref 65–99)
Glucose-Capillary: 196 mg/dL — ABNORMAL HIGH (ref 65–99)
Glucose-Capillary: 196 mg/dL — ABNORMAL HIGH (ref 65–99)

## 2017-12-20 MED ORDER — TIZANIDINE HCL 4 MG PO TABS
4.0000 mg | ORAL_TABLET | Freq: Three times a day (TID) | ORAL | Status: DC
  Administered 2017-12-20 – 2017-12-22 (×6): 4 mg via ORAL
  Filled 2017-12-20 (×6): qty 1

## 2017-12-20 MED ORDER — SENNOSIDES-DOCUSATE SODIUM 8.6-50 MG PO TABS
2.0000 | ORAL_TABLET | Freq: Two times a day (BID) | ORAL | Status: DC
  Administered 2017-12-20 – 2017-12-30 (×18): 2 via ORAL
  Filled 2017-12-20 (×20): qty 2

## 2017-12-20 NOTE — Progress Notes (Signed)
Subjective/Complaints:  C/o constipation on senna S 1 po qhs prn, BM recoreded per RN yest but pt denies  ROS: Patient denies chest pain, shortness of breath, nausea vomiting diarrhea constipation  Objective: Vital Signs: Blood pressure 138/77, pulse 79, temperature 98.7 F (37.1 C), temperature source Oral, resp. rate 18, height _0  (1.575 m), weight 67.4 kg (148 lb 9.4 oz), last menstrual period 12/10/2012, SpO2 97 %. No results found. Results for orders placed or performed during the hospital encounter of 12/09/17 (from the past 72 hour(s))  Glucose, capillary     Status: Abnormal   Collection Time: 12/17/17 12:36 PM  Result Value Ref Range   Glucose-Capillary 184 (H) 65 - 99 mg/dL  Glucose, capillary     Status: Abnormal   Collection Time: 12/17/17  4:47 PM  Result Value Ref Range   Glucose-Capillary 176 (H) 65 - 99 mg/dL  Glucose, capillary     Status: Abnormal   Collection Time: 12/17/17  9:43 PM  Result Value Ref Range   Glucose-Capillary 165 (H) 65 - 99 mg/dL  Glucose, capillary     Status: Abnormal   Collection Time: 12/18/17  6:30 AM  Result Value Ref Range   Glucose-Capillary 123 (H) 65 - 99 mg/dL  Glucose, capillary     Status: Abnormal   Collection Time: 12/18/17 11:48 AM  Result Value Ref Range   Glucose-Capillary 202 (H) 65 - 99 mg/dL  Glucose, capillary     Status: Abnormal   Collection Time: 12/18/17  5:04 PM  Result Value Ref Range   Glucose-Capillary 178 (H) 65 - 99 mg/dL  Glucose, capillary     Status: Abnormal   Collection Time: 12/18/17  9:47 PM  Result Value Ref Range   Glucose-Capillary 142 (H) 65 - 99 mg/dL  Basic metabolic panel     Status: Abnormal   Collection Time: 12/19/17  4:53 AM  Result Value Ref Range   Sodium 137 135 - 145 mmol/L   Potassium 3.2 (L) 3.5 - 5.1 mmol/L   Chloride 105 101 - 111 mmol/L   CO2 23 22 - 32 mmol/L   Glucose, Bld 127 (H) 65 - 99 mg/dL   BUN 24 (H) 6 - 20 mg/dL   Creatinine, Ser 1.46 (H) 0.44 - 1.00 mg/dL   Calcium 9.0 8.9 - 10.3 mg/dL   GFR calc non Af Amer 39 (L) >60 mL/min   GFR calc Af Amer 46 (L) >60 mL/min    Comment: (NOTE) The eGFR has been calculated using the CKD EPI equation. This calculation has not been validated in all clinical situations. eGFR's persistently <60 mL/min signify possible Chronic Kidney Disease.    Anion gap 9 5 - 15    Comment: Performed at [facility] [address] [place], [place] [address]  Glucose, capillary     Status: Abnormal   Collection Time: 12/19/17  6:39 AM  Result Value Ref Range   Glucose-Capillary 115 (H) 65 - 99 mg/dL  Glucose, capillary     Status: Abnormal   Collection Time: 12/19/17 11:42 AM  Result Value Ref Range   Glucose-Capillary 178 (H) 65 - 99 mg/dL  Glucose, capillary     Status: Abnormal   Collection Time: 12/19/17  4:47 PM  Result Value Ref Range   Glucose-Capillary 149 (H) 65 - 99 mg/dL  Glucose, capillary     Status: Abnormal   Collection Time: 12/19/17  9:07 PM  Result Value Ref Range   Glucose-Capillary 196 (H) 65 -  99 mg/dL  Glucose, capillary     Status: Abnormal   Collection Time: 12/20/17  6:36 AM  Result Value Ref Range   Glucose-Capillary 125 (H) 65 - 99 mg/dL     Constitutional: No distress . Vital signs reviewed. HEENT: EOMI, oral membranes moist Neck: supple Cardiovascular: RRR without murmur. No JVD    Respiratory: CTA Bilaterally without wheezes or rales. Normal effort    GI: BS +, non-tender, non-distended  Skin:   Intact Neuro: Alert/Oriented, Normal Sensory, Abnormal Motor 0/5 RUE adn RLE , 5/5 on left side and Dysarthric speech. Good insight and awareness. Tone MAS 3 at the pectoralis MAS 2 at the biceps Musc/Skel:  Normal Psych: in good spirits   Assessment/Plan: 1. Functional deficits secondary to Right hemiplegia left subcortical infarcts which require 3+ hours per day of interdisciplinary therapy in a comprehensive inpatient rehab setting. Physiatrist is providing close team  supervision and 24 hour management of active medical problems listed below. Physiatrist and rehab team continue to assess barriers to discharge/monitor patient progress toward functional and medical goals. FIM: Function - Bathing Position: Shower Body parts bathed by patient: Chest, Abdomen, Right upper leg, Left upper leg, Right arm, Right lower leg, Left lower leg, Front perineal area Body parts bathed by helper: Left arm, Buttocks, Back Assist Level: Touching or steadying assistance(Pt > 75%)  Function- Upper Body Dressing/Undressing What is the patient wearing?: Pull over shirt/dress Pull over shirt/dress - Perfomed by patient: Thread/unthread left sleeve, Thread/unthread right sleeve, Pull shirt over trunk Pull over shirt/dress - Perfomed by helper: Put head through opening Assist Level: Touching or steadying assistance(Pt > 75%) Function - Lower Body Dressing/Undressing What is the patient wearing?: Pants, Shoes, [employer], Underwear Position: Education officer, museum at [employer] - Performed by patient: Thread/unthread right underwear leg, Thread/unthread left underwear leg, Pull underwear up/down Pants- Performed by patient: Thread/unthread right pants leg, Thread/unthread left pants leg, Pull pants up/down Pants- Performed by helper: Pull pants up/down, Thread/unthread right pants leg Shoes - Performed by helper: Don/doff right shoe, Don/doff left shoe, Fasten right, Fasten left TED Hose - Performed by helper: Don/doff right TED hose, Don/doff left TED hose Assist for footwear: Maximal assist Assist for lower body dressing: Touching or steadying assistance (Pt > 75%)  Function - Toileting Toileting activity did not occur: No continent bowel/bladder event Toileting steps completed by patient: Performs perineal hygiene Toileting steps completed by helper: Adjust clothing prior to toileting, Performs perineal hygiene, Adjust clothing after toileting Toileting Assistive Devices: Grab  bar or rail Assist level: Two helpers  Function - Air cabin crew transfer assistive device: Radio broadcast assistant lift: Stedy Assist level to toilet: Maximal assist (Pt 25 - 49%/lift and lower) Assist level from toilet: Maximal assist (Pt 25 - 49%/lift and lower)  Function - Chair/bed transfer Chair/bed transfer method: Squat pivot Chair/bed transfer assist level: Moderate assist (Pt 50 - 74%/lift or lower) Chair/bed transfer assistive device: Armrests Chair/bed transfer details: Verbal cues for sequencing, Verbal cues for technique, Manual facilitation for weight shifting, Manual facilitation for placement  Function - Locomotion: Wheelchair Type: Manual Max wheelchair distance: 100 Assist Level: Touching or steadying assistance (Pt > 75%) Assist Level: Touching or steadying assistance (Pt > 75%) Wheel 150 feet activity did not occur: Safety/medical concerns Turns around,maneuvers to table,bed, and toilet,negotiates 3% grade,maneuvers on rugs and over doorsills: No Function - Locomotion: Ambulation Assistive device: Rail in hallway Max distance: 8 ft Assist level: Maximal assist (Pt 25 - 49%) Assist level: 2 helpers Walk  50 feet with 2 turns activity did not occur: Safety/medical concerns Walk 150 feet activity did not occur: Safety/medical concerns Walk 10 feet on uneven surfaces activity did not occur: Safety/medical concerns  Function - Comprehension Comprehension: Auditory Comprehension assist level: Understands basic 90% of the time/cues < 10% of the time  Function - Expression Expression: Verbal Expression assist level: Expresses basic 75 - 89% of the time/requires cueing 10 - 24% of the time. Needs helper to occlude trach/needs to repeat words.  Function - Social Interaction Social Interaction assist level: Interacts appropriately 90% of the time - Needs monitoring or encouragement for participation or interaction.  Function - Problem Solving Problem solving  assist level: Solves basic 75 - 89% of the time/requires cueing 10 - 24% of the time  Function - Memory Memory assist level: Recognizes or recalls 75 - 89% of the time/requires cueing 10 - 24% of the time Patient normally able to recall (first 3 days only): Current season, That he or she is in a hospital, Staff names and faces  Medical Problem List and Plan: 1.Right hemiplegia with dysarthria and dysphagiasecondary to left periventricular white matter left corona radiata infarctions as well as history of left brain infarction March 2019  -continue therapies, 2. DVT Prophylaxis/Anticoagulation: Subcutaneous Lovenox. Monitor for any bleeding episodes 3. Pain Management:Tylenol as needed 4. Mood:Provide emotional support 5. Neuropsych: This patientiscapable of making decisions on herown behalf. 6. Skin/Wound Care:Routine skin checks 7. Fluids/Electrolytes/Nutrition: encourage PO  -BUN was trending up, encourage p.o. will not start IV fluids at this time, fluid intake 12/16/2017 was 300 mL.  Recheck BMET  8.Dysphagia. Dysphagia #2 thin liquids. Follow-up speech therapy 9.Hypertension. Norvasc 5 mg daily, lisinopril 40 mg daily Vitals:   12/19/17 1946 12/20/17 0439  BP: 135/74 138/77  Pulse: (!) 58 79  Resp: 18 18  Temp: 98.4 F (36.9 C) 98.7 F (37.1 C)  SpO2: 100% 97%   elevated diastolic 5/07-DPBAQVO, increase amlodipine to 33m-controlled 5/27 10.Diabetes mellitus with peripheral neuropathy. Hemoglobin A1c 8.4. Sliding scale insulin. Check blood sugars before meals and at bedtime. Patient on DiaBeta 2.5 mg daily, insulin glargine 10 units nightly. Resume as needed CBG (last 3)  Recent Labs    12/19/17 1647 12/19/17 2107 12/20/17 0636  GLUCAP 149* 196* 125*  excellent in hospital control 5/27 11.Hyperlipidemia. Crestor 12.COPD with tobacco abuse. NicoDerm patch. Provide counseling. Inhalers as directed. 13.5 mm right upper lobe nodule with  nonspecific mildly enlarged mediastinal bilateral hilar lymph nodes per CT of the chest 12/08/2017. Recommendations are for noncontrast chest CT 12 months for evaluation 14.  HypoK mild likely nutritional started KCL 126m 5/23, still low increase to 2041m5/27 15.  Spasticity will start low-dose tizanidine continue PT and OT- no evidence of sedation, increase to 4mg74mD 16.  ARF- D/C ACE and Monitor, enc fluids, recheck BMET in am 17.  Constipation increase senna S to 2 po BID LOS (Days) 11 A FACE TO FACE EVALUATION WAS PERFORMED  AndrCharlett Blake7/2019, 8:28 AM

## 2017-12-20 NOTE — Plan of Care (Signed)
  Problem: Consults Goal: RH STROKE PATIENT EDUCATION Description See Patient Education module for education specifics  Outcome: Progressing   Problem: RH BLADDER ELIMINATION Goal: RH STG MANAGE BLADDER WITH ASSISTANCE Description STG Manage Bladder With Min Assistance  Outcome: Progressing Flowsheets (Taken 12/20/2017 1301) STG: Pt will manage bladder with assistance: 4-Minimal assistance   Problem: RH SAFETY Goal: RH STG ADHERE TO SAFETY PRECAUTIONS W/ASSISTANCE/DEVICE Description STG Adhere to Safety Precautions With Assistance/Device. With cues and reminders.  Outcome: Progressing Flowsheets (Taken 12/20/2017 1301) STG:Pt will adhere to safety precautions with assistance/device: 4-Minimal assistance Goal: RH STG DECREASED RISK OF FALL WITH ASSISTANCE Description STG Decreased Risk of Fall With Assistance. Cues and reminders.  Outcome: Progressing Flowsheets (Taken 12/20/2017 1301) FBP:PHKFEXMDY risk of fall  with assistance/device: 4-Minimal assistance   Problem: RH KNOWLEDGE DEFICIT Goal: RH STG INCREASE KNOWLEDGE OF DIABETES Description Pt.'s husband will be able to demonstrate knowledge of how to use medications, CBG monitoring, diet with cues and reminders.  Outcome: Progressing Goal: RH STG INCREASE KNOWLEDGE OF HYPERTENSION Description Husband will be able to demonstrate knowledge of medications and diet with cues and reminders.  Outcome: Progressing

## 2017-12-20 NOTE — Progress Notes (Signed)
Speech Language Pathology Daily Session Note  Patient Details  Name: Kelly Gibson MRN: [id number] Date of Birth: 05/03/1961  Today's Date: 12/20/2017 SLP Individual Time: 0900-1000 SLP Individual Time Calculation (min): 60 min  Short Term Goals: Week 2: SLP Short Term Goal 1 (Week 2): Patient will recall new, daily information with use of external aids with Min A multimodal cues.  SLP Short Term Goal 2 (Week 2): Patient will demonstrate functional problem solving for basic and familiar tasks with supervision verbal cues.  SLP Short Term Goal 3 (Week 2): Patient will utilize speech intelligibility strategies at the sentence level with Min A verbal cues to achieve 90% of intelligibility.  SLP Short Term Goal 4 (Week 2): Patient will consume trials of regular textures and demonstrate efficient mastication with complete oral clearance without overt s/s of aspiration over 2 sessions prior to upgrade with supervision verbal cues.   Skilled Therapeutic Interventions: Skilled treatment session focused on cognitive goals. SLP facilitated session by providing extra time and Min A verbal cues for problem solving and recall during a mildly complex, novel card task. SLP also facilitated session with Mod A verbal cues for use of memory compensatory strategies during a recall task. Patient left upright in wheelchair with all needs within reach. Continue with current plan of care.      Function:   Cognition Comprehension Comprehension assist level: Understands basic 90% of the time/cues < 10% of the time  Expression   Expression assist level: Expresses basic 75 - 89% of the time/requires cueing 10 - 24% of the time. Needs helper to occlude trach/needs to repeat words.  Social Interaction Social Interaction assist level: Interacts appropriately 90% of the time - Needs monitoring or encouragement for participation or interaction.  Problem Solving Problem solving assist level: Solves basic 75 - 89% of the  time/requires cueing 10 - 24% of the time  Memory Memory assist level: Recognizes or recalls 75 - 89% of the time/requires cueing 10 - 24% of the time    Pain No/Denies Pain   Therapy/Group: Individual Therapy  Kelly Gibson 12/20/2017, 2:47 PM

## 2017-12-20 NOTE — Progress Notes (Signed)
Occupational Therapy Session Note  Patient Details  Name: Kelly Gibson MRN: [id number] Date of Birth: 03-30-1961  Today's Date: 12/20/2017 OT Individual Time: 0932-6712 OT Individual Time Calculation (min): 75 min    Short Term Goals: Week 2:  OT Short Term Goal 1 (Week 2): Pt will complete 2/3 toileting tasks with mod steadying assist in order to reduce caregiver burden OT Short Term Goal 2 (Week 2): Pt will don shirt with min A OT Short Term Goal 3 (Week 2): Pt will thread B LEs into pants with steadying assist OT Short Term Goal 4 (Week 2): Pt will recall and utilize hemi dressing technique mod I  Skilled Therapeutic Interventions/Progress Updates:    Pt seen for OT session focusin gon neuro re-ed with R UE/LE. Pt sitting up in w/c upon arrival with husband present. Pt eating breakfast, having already dressed and ready for the day. While pt finished breakfast, discussed role of OT, asking pt to wait to complete B/D for OT ession in order to work on OT goals and increase indepenecnce with ADL. Education also provided regarding pt's flexor tone and functional implications in PT/OT.  In therapy gym, completed mod A squat pivot transfer to therapy mat. Completed functional reaching task, required to cross midline to place clothes pin placed on R. R UE placed in [place] position and pt WBing through R LE and return to midline.  Pt returned to supine on mat. Performed UE stretching as pt with increased flexor tone in R UE.  Completed E-stim for wrist extension for 10 minutes level 20. Then completed 12 minutes on tricep to reduce flexor tone. Pt tolerated we,,no adverse reactions.  Pt returned to room at end of session, left seated in w/c at end of session, QRB donned and all needs in reach  Therapy Documentation Precautions:  Precautions Precautions: Fall Precaution Comments: bradycardia Restrictions Weight Bearing Restrictions: No RUE Weight Bearing: Weight bearing as tolerated RLE  Weight Bearing: Weight bearing as tolerated Pain:   No/denies pain ADL: ADL ADL Comments: refer to functional navigator  See Function Navigator for Current Functional Status.   Therapy/Group: Individual Therapy  [name] L 12/20/2017, 7:07 AM

## 2017-12-20 NOTE — Progress Notes (Addendum)
Physical Therapy Session Note  Patient Details  Name: Vonette Grosso MRN: [id number] Date of Birth: 12/30/1960  Today's Date: 12/20/2017 PT Individual Time: 1104-1202 PT Individual Time Calculation (min): 58 min   Short Term Goals: Week 2:  PT Short Term Goal 1 (Week 2): Pt will propel w/c 50 ft with supervision. PT Short Term Goal 2 (Week 2): Pt will complete bed<>w/c transfers with min assist.  PT Short Term Goal 3 (Week 2): Pt will ambulate 30 ft with max assist +1 for NMR & strengthening.  Skilled Therapeutic Interventions/Progress Updates:  Pt received in w/c & agreeable to tx. Transported pt to gym via w/c total assist for time management. Gait with rail in hallway x 30 ft with max assist + w/c follow for safety with task focusing on RLE weight bearing for strengthening & NMR, and to facilitate functional return in extremity. Pt requires max multimodal cuing for upright posture, forward gaze, anterior pelvic shift, cuing for sequencing steps, and max assist to advance & support RLE. Pt and therapist unable to flex R hip/knee 2/2 significant extensor tone in extremity. Transitioned to nu-step on level 2 x 3 minutes + 3 minutes with BLE & LUE with task focusing on reciprocal movement in LE and to help facilitate R hip/knee flexion. Therapist provides manual facilitation to maintain neutral alignment of R knee, otherwise pt with significant abduction. Therapist attempted to lower w/c to hemi height to increase ease of w/c propulsion but unable to at this time. Pt propels w/c short distances in controlled hospital environment with max cuing for hemi technique and min assist for linear trajectory to prevent pt from steering to R 2/2 limited LLE use. Attempted gait at rail (10 ft) with therapist able to passively flex R hip/knee on first few initial steps but as tasked progressed pt with returning tone and therapist with limited ability to facilitate functional movement. Due to increased tone it is  not expected that pt will be a functional ambulator upon d/c and will use w/c as method of mobility. During session pt completes w/c<>nu-step transfers via stand/squat pivot with min assist when moving L and mod assist when transferring R; therapist provides education on need for increased control of movement when transferring to R. Pt also completes sit>stand transfers with max cuing to push up on w/c armrests but requiring max assist, however pt able to complete transfer with min assist when pulling up on rail in hallway. At end of session pt left in w/c with quick release belt & chair alarm donned, all needs within reach.  Therapy Documentation Precautions:  Precautions Precautions: Fall Precaution Comments: bradycardia Restrictions Weight Bearing Restrictions: No RUE Weight Bearing: Weight bearing as tolerated RLE Weight Bearing: Weight bearing as tolerated   Pain: No c/o pain reported  See Function Navigator for Current Functional Status.   Therapy/Group: Individual Therapy  Waunita Schooner 12/20/2017, 12:20 PM

## 2017-12-21 ENCOUNTER — Inpatient Hospital Stay (HOSPITAL_COMMUNITY): Payer: BLUE CROSS/BLUE SHIELD | Admitting: Physical Therapy

## 2017-12-21 ENCOUNTER — Inpatient Hospital Stay (HOSPITAL_COMMUNITY): Payer: BLUE CROSS/BLUE SHIELD

## 2017-12-21 ENCOUNTER — Inpatient Hospital Stay (HOSPITAL_COMMUNITY): Payer: BLUE CROSS/BLUE SHIELD | Admitting: Occupational Therapy

## 2017-12-21 LAB — GLUCOSE, CAPILLARY
GLUCOSE-CAPILLARY: 128 mg/dL — AB (ref 65–99)
GLUCOSE-CAPILLARY: 122 mg/dL — AB (ref 65–99)
Glucose-Capillary: 125 mg/dL — ABNORMAL HIGH (ref 65–99)
Glucose-Capillary: 165 mg/dL — ABNORMAL HIGH (ref 65–99)

## 2017-12-21 LAB — BASIC METABOLIC PANEL
ANION GAP: 10 (ref 5–15)
BUN: 24 mg/dL — ABNORMAL HIGH (ref 6–20)
CALCIUM: 9.3 mg/dL (ref 8.9–10.3)
CO2: 24 mmol/L (ref 22–32)
Chloride: 107 mmol/L (ref 101–111)
Creatinine, Ser: 1.43 mg/dL — ABNORMAL HIGH (ref 0.44–1.00)
GFR calc Af Amer: 47 mL/min — ABNORMAL LOW (ref >60)
GFR calc non Af Amer: 40 mL/min — ABNORMAL LOW (ref >60)
GLUCOSE: 138 mg/dL — AB (ref 65–99)
Potassium: 3.3 mmol/L — ABNORMAL LOW (ref 3.5–5.1)
Sodium: 141 mmol/L (ref 135–145)

## 2017-12-21 MED ORDER — BISACODYL 10 MG RE SUPP: 10.0000 mg | Freq: Every day | RECTAL | Status: DC | PRN

## 2017-12-21 MED ORDER — SORBITOL 70 % SOLN
30.0000 mL | Freq: Every day | Status: DC | PRN
  Administered 2017-12-21: 30 mL via ORAL
  Filled 2017-12-21: qty 30

## 2017-12-21 MED ORDER — GLYBURIDE 2.5 MG PO TABS
2.5000 mg | ORAL_TABLET | Freq: Every day | ORAL | Status: DC
  Administered 2017-12-21 – 2017-12-30 (×10): 2.5 mg via ORAL
  Filled 2017-12-21 (×10): qty 1

## 2017-12-21 NOTE — Progress Notes (Signed)
Occupational Therapy Session Note  Patient Details  Name: Kelly Gibson MRN: [id number] Date of Birth: 1961-07-12  Today's Date: 12/21/2017 OT Individual Time: 7948-0165 OT Individual Time Calculation (min): 75 min    Short Term Goals: Week 2:  OT Short Term Goal 1 (Week 2): Pt will complete 2/3 toileting tasks with mod steadying assist in order to reduce caregiver burden OT Short Term Goal 2 (Week 2): Pt will don shirt with min A OT Short Term Goal 3 (Week 2): Pt will thread B LEs into pants with steadying assist OT Short Term Goal 4 (Week 2): Pt will recall and utilize hemi dressing technique mod I  Skilled Therapeutic Interventions/Progress Updates:    Pt seen for OT ADL bathing/dressing session. Pt sitting EOB upon arrival with husband present. Pt voiced having had incontinent BM and needing to get cleaned up. Pt returned to supine and hygiene completed total A, pt rolling with mod A using hosptial bed functions.  She returned to sitting EOB with mod A, multimodal cuing for sequencing/technique. Completed mod A squat pivot transfer to w/c, cues for recalling technique. Pt declined showering this morning opting to complete bathing/dressing from w/c level at sink. Max hand over hand assist provided for functional use of R UE at stabilzer level when opening containers and when washing L UE.  She required mod cuing to recall and implement hemi dressing technique. With instruction, pt able to use reacher to assist with threading R E into pants, able to thread L without assist. She stood at sink with min-mod steadying assist for hygiene to be completed total A and pt able to assist with pulling pants up. Pt left seated set-up with breakfast tray at end of session, husband present and all needs in reach. Education/discussion throughout session regarding role of OT, therapeutic benefits of participation with ADLs, OT/PT goals, and d/c planning.   Therapy Documentation Precautions:   Precautions Precautions: Fall Precaution Comments: bradycardia Restrictions Weight Bearing Restrictions: No Pain:   Complaints of stomach discomfort 2/2 constipation, RN made aware.  ADL: ADL ADL Comments: refer to functional navigator  See Function Navigator for Current Functional Status.   Therapy/Group: Individual Therapy  Delaila Nand L 12/21/2017, 6:53 AM

## 2017-12-21 NOTE — Progress Notes (Addendum)
Physical Therapy Weekly Progress Note  Patient Details  Name: Kelly Gibson MRN: [id number] Date of Birth: [birth year]  Beginning of progress report period: Dec 15, 2017 End of progress report period: Dec 21, 2017  Today's Date: 12/21/2017   Patient has met 2 of 3 short term goals.  Pt is making fair progress towards LTGs. Pt currently requires min<>mod assist for squat pivot transfers (requires more assistance to transfer to R side), and has progressed to supervision for w/c mobility with L hemi technique in controlled hospital environment. Gait has been attempted with rail in hallway to focus on R NMR and strengthening however pt is greatly limited by extensor tone in extremity. Pt would benefit from continued skilled PT treatment to focus on sitting balance, transfers, pt/family education & caregiver training, and w/c mobility.   Patient continues to demonstrate the following deficits muscle weakness, decreased cardiorespiratoy endurance, decreased coordination, decreased visual perceptual skills, decreased attention to right, decreased attention, decreased awareness, decreased problem solving, decreased safety awareness, decreased memory and delayed processing, and decreased sitting balance, decreased standing balance, decreased postural control, hemiplegia and decreased balance strategies and therefore will continue to benefit from skilled PT intervention to increase functional independence with mobility.  Addendum: Patient's LTG's have been downgraded to min assist for transfers (mod assist car transfer).  PT Short Term Goals Week 2:  PT Short Term Goal 1 (Week 2): Pt will propel w/c 50 ft with supervision. PT Short Term Goal 1 - Progress (Week 2): Met PT Short Term Goal 2 (Week 2): Pt will complete bed<>w/c transfers with min assist.  PT Short Term Goal 2 - Progress (Week 2): Partly met(met 50% of the time, meets when transferring to L, mod assist when transferring to R) PT Short Term  Goal 3 (Week 2): Pt will ambulate 30 ft with max assist +1 for NMR & strengthening. PT Short Term Goal 3 - Progress (Week 2): Met Week 3:  PT Short Term Goal 1 (Week 3): STG = LTG due to estimated d/c date.   Therapy Documentation Precautions:  Precautions Precautions: Fall Precaution Comments: bradycardia Restrictions Weight Bearing Restrictions: No RUE Weight Bearing: Weight bearing as tolerated RLE Weight Bearing: Weight bearing as tolerated   See Function Navigator for Current Functional Status.  Therapy/Group: Individual Therapy  Waunita Schooner 12/21/2017, 11:49 AM

## 2017-12-21 NOTE — Progress Notes (Signed)
Subjective/Complaints:  Discussed constipation with RN, small BM yesterday, but a larger BM this am No abd pain   ROS: Patient denies chest pain, shortness of breath, nausea vomiting diarrhea constipation  Objective: Vital Signs: Blood pressure 130/80, pulse (!) 58, temperature 98.6 F (37 C), temperature source Oral, resp. rate 18, height _0  (1.575 m), weight 67.4 kg (148 lb 9.4 oz), last menstrual period 12/10/2012, SpO2 99 %. No results found. Results for orders placed or performed during the hospital encounter of 12/09/17 (from the past 72 hour(s))  Glucose, capillary     Status: Abnormal   Collection Time: 12/18/17 11:48 AM  Result Value Ref Range   Glucose-Capillary 202 (H) 65 - 99 mg/dL  Glucose, capillary     Status: Abnormal   Collection Time: 12/18/17  5:04 PM  Result Value Ref Range   Glucose-Capillary 178 (H) 65 - 99 mg/dL  Glucose, capillary     Status: Abnormal   Collection Time: 12/18/17  9:47 PM  Result Value Ref Range   Glucose-Capillary 142 (H) 65 - 99 mg/dL  Basic metabolic panel     Status: Abnormal   Collection Time: 12/19/17  4:53 AM  Result Value Ref Range   Sodium 137 135 - 145 mmol/L   Potassium 3.2 (L) 3.5 - 5.1 mmol/L   Chloride 105 101 - 111 mmol/L   CO2 23 22 - 32 mmol/L   Glucose, Bld 127 (H) 65 - 99 mg/dL   BUN 24 (H) 6 - 20 mg/dL   Creatinine, Ser 1.46 (H) 0.44 - 1.00 mg/dL   Calcium 9.0 8.9 - 10.3 mg/dL   GFR calc non Af Amer 39 (L) >60 mL/min   GFR calc Af Amer 46 (L) >60 mL/min    Comment: (NOTE) The eGFR has been calculated using the CKD EPI equation. This calculation has not been validated in all clinical situations. eGFR's persistently <60 mL/min signify possible Chronic Kidney Disease.    Anion gap 9 5 - 15    Comment: Performed at [facility] [address], [place], [place] [address]  Glucose, capillary     Status: Abnormal   Collection Time: 12/19/17  6:39 AM  Result Value Ref Range   Glucose-Capillary 115 (H) 65 -  99 mg/dL  Glucose, capillary     Status: Abnormal   Collection Time: 12/19/17 11:42 AM  Result Value Ref Range   Glucose-Capillary 178 (H) 65 - 99 mg/dL  Glucose, capillary     Status: Abnormal   Collection Time: 12/19/17  4:47 PM  Result Value Ref Range   Glucose-Capillary 149 (H) 65 - 99 mg/dL  Glucose, capillary     Status: Abnormal   Collection Time: 12/19/17  9:07 PM  Result Value Ref Range   Glucose-Capillary 196 (H) 65 - 99 mg/dL  Glucose, capillary     Status: Abnormal   Collection Time: 12/20/17  6:36 AM  Result Value Ref Range   Glucose-Capillary 125 (H) 65 - 99 mg/dL  Glucose, capillary     Status: Abnormal   Collection Time: 12/20/17 12:05 PM  Result Value Ref Range   Glucose-Capillary 132 (H) 65 - 99 mg/dL  Glucose, capillary     Status: Abnormal   Collection Time: 12/20/17  5:28 PM  Result Value Ref Range   Glucose-Capillary 196 (H) 65 - 99 mg/dL  Glucose, capillary     Status: Abnormal   Collection Time: 12/20/17  9:24 PM  Result Value Ref Range   Glucose-Capillary 196 (  H) 65 - 99 mg/dL  Glucose, capillary     Status: Abnormal   Collection Time: 12/21/17  6:36 AM  Result Value Ref Range   Glucose-Capillary 125 (H) 65 - 99 mg/dL  Basic metabolic panel     Status: Abnormal   Collection Time: 12/21/17  7:08 AM  Result Value Ref Range   Sodium 141 135 - 145 mmol/L   Potassium 3.3 (L) 3.5 - 5.1 mmol/L   Chloride 107 101 - 111 mmol/L   CO2 24 22 - 32 mmol/L   Glucose, Bld 138 (H) 65 - 99 mg/dL   BUN 24 (H) 6 - 20 mg/dL   Creatinine, Ser 1.43 (H) 0.44 - 1.00 mg/dL   Calcium 9.3 8.9 - 10.3 mg/dL   GFR calc non Af Amer 40 (L) >60 mL/min   GFR calc Af Amer 47 (L) >60 mL/min    Comment: (NOTE) The eGFR has been calculated using the CKD EPI equation. This calculation has not been validated in all clinical situations. eGFR's persistently <60 mL/min signify possible Chronic Kidney Disease.    Anion gap 10 5 - 15    Comment: Performed at [facility] [address] [place], [place] [address]     Constitutional: No distress . Vital signs reviewed. HEENT: EOMI, oral membranes moist Neck: supple Cardiovascular: RRR without murmur. No JVD    Respiratory: CTA Bilaterally without wheezes or rales. Normal effort    GI: BS +, non-tender, non-distended  Skin:   Intact Neuro: Alert/Oriented, Normal Sensory, Abnormal Motor 0/5 RUE adn RLE , 5/5 on left side and Dysarthric speech. Good insight and awareness. Tone MAS 3 at the pectoralis MAS 2 at the biceps Musc/Skel:  Normal Psych: in good spirits   Assessment/Plan: 1. Functional deficits secondary to Right hemiplegia left subcortical infarcts which require 3+ hours per day of interdisciplinary therapy in a comprehensive inpatient rehab setting. Physiatrist is providing close team supervision and 24 hour management of active medical problems listed below. Physiatrist and rehab team continue to assess barriers to discharge/monitor patient progress toward functional and medical goals. FIM: Function - Bathing Position: Shower Body parts bathed by patient: Chest, Abdomen, Right upper leg, Left upper leg, Right arm, Right lower leg, Left lower leg, Front perineal area Body parts bathed by helper: Left arm, Buttocks, Back Assist Level: Touching or steadying assistance(Pt > 75%)  Function- Upper Body Dressing/Undressing What is the patient wearing?: Pull over shirt/dress Pull over shirt/dress - Perfomed by patient: Thread/unthread left sleeve, Thread/unthread right sleeve, Pull shirt over trunk Pull over shirt/dress - Perfomed by helper: Put head through opening Assist Level: Touching or steadying assistance(Pt > 75%) Function - Lower Body Dressing/Undressing What is the patient wearing?: Pants, Shoes, [employer], Underwear Position: Education officer, museum at [employer] - Performed by patient: Thread/unthread right underwear leg, Thread/unthread left underwear leg, Pull underwear up/down Pants-  Performed by patient: Thread/unthread right pants leg, Thread/unthread left pants leg, Pull pants up/down Pants- Performed by helper: Pull pants up/down, Thread/unthread right pants leg Shoes - Performed by helper: Don/doff right shoe, Don/doff left shoe, Fasten right, Fasten left TED Hose - Performed by helper: Don/doff right TED hose, Don/doff left TED hose Assist for footwear: Maximal assist Assist for lower body dressing: Touching or steadying assistance (Pt > 75%)  Function - Toileting Toileting activity did not occur: No continent bowel/bladder event Toileting steps completed by patient: Performs perineal hygiene Toileting steps completed by helper: Adjust clothing prior to toileting, Performs perineal hygiene, Adjust  clothing after toileting Toileting Assistive Devices: Grab bar or rail Assist level: Two helpers  Function - Air cabin crew transfer assistive device: Radio broadcast assistant lift: [place] level to toilet: Maximal assist (Pt 25 - 49%/lift and lower) Assist level from toilet: Maximal assist (Pt 25 - 49%/lift and lower)  Function - Chair/bed transfer Chair/bed transfer method: Squat pivot, Stand pivot Chair/bed transfer assist level: Moderate assist (Pt 50 - 74%/lift or lower) Chair/bed transfer assistive device: Armrests Chair/bed transfer details: Tactile cues for weight shifting, Tactile cues for placement, Verbal cues for technique, Verbal cues for sequencing, Manual facilitation for weight shifting, Manual facilitation for placement  Function - Locomotion: Wheelchair Type: Manual Max wheelchair distance: 40 ft L Hemi technique Assist Level: Touching or steadying assistance (Pt > 75%) Assist Level: Touching or steadying assistance (Pt > 75%) Wheel 150 feet activity did not occur: Safety/medical concerns Turns around,maneuvers to table,bed, and toilet,negotiates 3% grade,maneuvers on rugs and over doorsills: No Function - Locomotion:  Ambulation Assistive device: Rail in hallway Max distance: 30 ft Assist level: 2 helpers(max assist + w/c follow for safety) Assist level: 2 helpers Walk 50 feet with 2 turns activity did not occur: Safety/medical concerns Walk 150 feet activity did not occur: Safety/medical concerns Walk 10 feet on uneven surfaces activity did not occur: Safety/medical concerns  Function - Comprehension Comprehension: Auditory Comprehension assist level: Understands basic 90% of the time/cues < 10% of the time  Function - Expression Expression: Verbal Expression assist level: Expresses basic 75 - 89% of the time/requires cueing 10 - 24% of the time. Needs helper to occlude trach/needs to repeat words.  Function - Social Interaction Social Interaction assist level: Interacts appropriately 90% of the time - Needs monitoring or encouragement for participation or interaction.  Function - Problem Solving Problem solving assist level: Solves basic 75 - 89% of the time/requires cueing 10 - 24% of the time  Function - Memory Memory assist level: Recognizes or recalls 75 - 89% of the time/requires cueing 10 - 24% of the time Patient normally able to recall (first 3 days only): Current season, That he or she is in a hospital, Staff names and faces  Medical Problem List and Plan: 1.Right hemiplegia with dysarthria and dysphagiasecondary to left periventricular white matter left corona radiata infarctions as well as history of left brain infarction March 2019  -continue therapies, 2. DVT Prophylaxis/Anticoagulation: Subcutaneous Lovenox. Monitor for any bleeding episodes 3. Pain Management:Tylenol as needed 4. Mood:Provide emotional support 5. Neuropsych: This patientiscapable of making decisions on herown behalf. 6. Skin/Wound Care:Routine skin checks 7. Fluids/Electrolytes/Nutrition: encourage PO  -BUN was trending up, encourage p.o. will not start IV fluids at this time, fluid intake 12/16/2017  was 300 mL.  Recheck BMET  8.Dysphagia. Dysphagia #2 thin liquids. Follow-up speech therapy 9.Hypertension. Norvasc 5 mg daily, lisinopril 40 mg daily Vitals:   12/20/17 2008 12/21/17 0511  BP: (!) 148/76 130/80  Pulse: 79 (!) 58  Resp: 18 18  Temp: 98.8 F (37.1 C) 98.6 F (37 C)  SpO2: 100% 99%   elevated diastolic 8/46-[id number], increase amlodipine to 40m-controlled 5/27 10.Diabetes mellitus with peripheral neuropathy. Hemoglobin A1c 8.4. Sliding scale insulin. Check blood sugars before meals and at bedtime. Patient on DiaBeta 2.5 mg daily, insulin glargine 10 units nightly. Resume as needed CBG (last 3)  Recent Labs    12/20/17 1728 12/20/17 2124 12/21/17 0636  GLUCAP 196* 196* 125*  fair in hospital control 5/28, am CBG ok but elevated pm will resume  glyburide 2.34mg qam 11.Hyperlipidemia. Crestor 12.COPD with tobacco abuse. NicoDerm patch. Provide counseling. Inhalers as directed. 13.5 mm right upper lobe nodule with nonspecific mildly enlarged mediastinal bilateral hilar lymph nodes per CT of the chest 12/08/2017. Recommendations are for noncontrast chest CT 12 months for evaluation 14.  HypoK mild likely nutritional started KCL 143m 5/23, still low increase to 2034m5/27, cont this dose for now  15.  Spasticity will start low-dose tizanidine continue PT and OT- no evidence of sedation, increase to 4mg63mD 16.  ARF- D/C ACE and Monitor, enc fluids, recheck BMET Stable, cont enc fluids 17.  Constipation increase senna S to 2 po BID, sorbitol today , dulc supp tonite LOS (Days) 12 A FACE TO FACE EVALUATION WAS PERFORMED  AndrCharlett Blake8/2019, 8:36 AM

## 2017-12-21 NOTE — Progress Notes (Signed)
Speech Language Pathology Daily Session Note  Patient Details  Name: Kelly Gibson MRN: [id number] Date of Birth: 01-Dec-1960  Today's Date: 12/21/2017 SLP Individual Time: 1500-1530 SLP Individual Time Calculation (min): 30 min  Short Term Goals: Week 2: SLP Short Term Goal 1 (Week 2): Patient will recall new, daily information with use of external aids with Min A multimodal cues.  SLP Short Term Goal 2 (Week 2): Patient will demonstrate functional problem solving for basic and familiar tasks with supervision verbal cues.  SLP Short Term Goal 3 (Week 2): Patient will utilize speech intelligibility strategies at the sentence level with Min A verbal cues to achieve 90% of intelligibility.  SLP Short Term Goal 4 (Week 2): Patient will consume trials of regular textures and demonstrate efficient mastication with complete oral clearance without overt s/s of aspiration over 2 sessions prior to upgrade with supervision verbal cues.   Skilled Therapeutic Interventions:Skilled ST services focused on cognitive and swallow skills. SLP facilitated PO consumption of regular textured trial with no overt s/s aspiration and complete oral clearance, SLP recommends trial tray in future sessions to assess diet upgrade readiness.  SLP facilitated problem solving, and recall utilizing familiar card game, blink, pt demonstrated recall of rules with min a veberal cues, pt required min-supervision A for problem solving. Pt demonstrated speech intelligibility at sentence level with 80% accuracy given min A verbal cues. Pt was left in room with call bell within reach. Reccomend to continue skilled ST services.      Function:  Eating Eating                 Cognition Comprehension Comprehension assist level: Understands basic 90% of the time/cues < 10% of the time  Expression   Expression assist level: Expresses basic 75 - 89% of the time/requires cueing 10 - 24% of the time. Needs helper to occlude  trach/needs to repeat words.  Social Interaction Social Interaction assist level: Interacts appropriately 90% of the time - Needs monitoring or encouragement for participation or interaction.  Problem Solving Problem solving assist level: Solves basic 75 - 89% of the time/requires cueing 10 - 24% of the time;Solves basic 90% of the time/requires cueing < 10% of the time  Memory Memory assist level: Recognizes or recalls 75 - 89% of the time/requires cueing 10 - 24% of the time    Pain Pain Assessment Pain Score: 0-No pain  Therapy/Group: Individual Therapy  Kelly Gibson  [facility] 12/21/2017, 3:32 PM

## 2017-12-21 NOTE — Progress Notes (Signed)
Physical Therapy Session Note  Patient Details  Name: Kelly Gibson MRN: [id number] Date of Birth: 03/15/1961  Today's Date: 12/21/2017 PT Individual Time: 5035-4656 PT Individual Time Calculation (min): 83 min   Short Term Goals: Week 2:  PT Short Term Goal 1 (Week 2): Pt will propel w/c 50 ft with supervision. PT Short Term Goal 1 - Progress (Week 2): Met PT Short Term Goal 2 (Week 2): Pt will complete bed<>w/c transfers with min assist.  PT Short Term Goal 2 - Progress (Week 2): Partly met(met 50% of the time, meets when transferring to L, mod assist when transferring to R) PT Short Term Goal 3 (Week 2): Pt will ambulate 30 ft with max assist +1 for NMR & strengthening. PT Short Term Goal 3 - Progress (Week 2): Met  Skilled Therapeutic Interventions/Progress Updates:  Pt received in w/c & agreeable to tx. Pt denied c/o pain. Therapist adjusted w/c to hemi height to allow pt to practice w/c mobility with proper positioning and increased ease of task. Pt propelled w/c with L hemi technique x 125 ft with multiple rest breaks and at end of task pt reports "I'm exhausted". Pt requires occasional cuing for technique. Pt completes multiple squat pivot transfers w/c<>mat table with max cuing for sequencing and min/mod assist for transfer. Pt requires continued cuing for hand placement and overall technique. Pt sat edge of mat and engaged in sitting balance activity requiring her to reach to R then return to midline. Pt experienced multiple LOB to R requiring min<>max assist to return to midline; when experiencing LOB to R pt with decreased awareness of LOB and overall safety. Pt also requires cuing to utilize core and limit use of LUE to correct posture. Pt fatigues quickly with task. At end of session pt left sitting in w/c in room with quick release belt & chair alarm donned, NT in room & call bell within reach.   Therapy Documentation Precautions:  Precautions Precautions: Fall Precaution  Comments: bradycardia Restrictions Weight Bearing Restrictions: No RUE Weight Bearing: Weight bearing as tolerated RLE Weight Bearing: Weight bearing as tolerated    See Function Navigator for Current Functional Status.   Therapy/Group: Individual Therapy  Waunita Schooner 12/21/2017, 12:13 PM

## 2017-12-22 ENCOUNTER — Inpatient Hospital Stay (HOSPITAL_COMMUNITY): Payer: BLUE CROSS/BLUE SHIELD | Admitting: Physical Therapy

## 2017-12-22 ENCOUNTER — Inpatient Hospital Stay (HOSPITAL_COMMUNITY): Payer: BLUE CROSS/BLUE SHIELD

## 2017-12-22 ENCOUNTER — Inpatient Hospital Stay (HOSPITAL_COMMUNITY): Payer: BLUE CROSS/BLUE SHIELD | Admitting: Occupational Therapy

## 2017-12-22 LAB — GLUCOSE, CAPILLARY
GLUCOSE-CAPILLARY: 95 mg/dL (ref 65–99)
GLUCOSE-CAPILLARY: 150 mg/dL — AB (ref 65–99)
Glucose-Capillary: 238 mg/dL — ABNORMAL HIGH (ref 65–99)
Glucose-Capillary: 143 mg/dL — ABNORMAL HIGH (ref 65–99)

## 2017-12-22 MED ORDER — TIZANIDINE HCL 4 MG PO TABS
4.0000 mg | ORAL_TABLET | Freq: Three times a day (TID) | ORAL | Status: DC
  Administered 2017-12-22 – 2017-12-27 (×20): 4 mg via ORAL
  Filled 2017-12-22 (×21): qty 1

## 2017-12-22 NOTE — Progress Notes (Signed)
Occupational Therapy Session Note  Patient Details  Name: Kelly Gibson MRN: [id number] Date of Birth: 07/06/1961  Today's Date: 12/22/2017 OT Individual Time: 1191-4782 OT Individual Time Calculation (min): 60 min    Short Term Goals: Week 2:  OT Short Term Goal 1 (Week 2): Pt will complete 2/3 toileting tasks with mod steadying assist in order to reduce caregiver burden OT Short Term Goal 2 (Week 2): Pt will don shirt with min A OT Short Term Goal 3 (Week 2): Pt will thread B LEs into pants with steadying assist OT Short Term Goal 4 (Week 2): Pt will recall and utilize hemi dressing technique mod I  Skilled Therapeutic Interventions/Progress Updates:    Pt seen for OT session focusing on ADL re-training, family education and functional transfers.   Pt sitting up in w/c upon arrival with husband present assisting with donning shoes. Pt agreeable to tx session. She self propelled to thrapy day room using hemi technique with supervisionand VCs for efective propulsion technique.  SHe ate breakfast seated at hihg/low table with R UE placed in front for weightbearing and attention to extremity. PT able to self feed and manage swallowing pre-cautioons with set-up assist.  She then propelled to therapy gym with min A.  Initiated hands on family education with pt's husband beginning with basic squat pivot transfer for w/c <> EOM. Completed x2 with assist from therapist with VCs and education provided regarding set-up and technique. Pt's hsuband then completed x2 transfers w/c<> EOM, providing mod A overall, VCs for proper body mechanics and safety awareness. Pt then completed x2 sit>stand at side of parallel bar, able to power into standing with min A pulling on grab bar, maintaining static standing balance with CGA- R knee hyper extended assisting with standing balance. Extensive education and discussion regarding home set-up, recommendations for toileting tasks including drop arm BSC and use of  grab bar. Will cont with functional problem solving and hands on family training in prep for d/c home next weekend.  Therapy Documentation Precautions:  Precautions Precautions: Fall Precaution Comments: bradycardia Restrictions Weight Bearing Restrictions: No   ADL: ADL ADL Comments: refer to functional navigator  See Function Navigator for Current Functional Status.   Therapy/Group: Individual Therapy  Jaydon Avina L 12/22/2017, 7:07 AM

## 2017-12-22 NOTE — Progress Notes (Signed)
Subjective/Complaints:  Per husband , still has tightness in Right arm and shoulder  ROS: Patient denies chest pain, shortness of breath, nausea vomiting diarrhea constipation  Objective: Vital Signs: Blood pressure (!) 148/84, pulse 76, temperature 97.8 F (36.6 C), temperature source Oral, resp. rate 18, height 5' 2"  (1.575 m), weight 67.4 kg (148 lb 9.4 oz), last menstrual period 12/10/2012, SpO2 100 %. No results found. Results for orders placed or performed during the hospital encounter of 12/09/17 (from the past 72 hour(s))  Glucose, capillary     Status: Abnormal   Collection Time: 12/19/17 11:42 AM  Result Value Ref Range   Glucose-Capillary 178 (H) 65 - 99 mg/dL  Glucose, capillary     Status: Abnormal   Collection Time: 12/19/17  4:47 PM  Result Value Ref Range   Glucose-Capillary 149 (H) 65 - 99 mg/dL  Glucose, capillary     Status: Abnormal   Collection Time: 12/19/17  9:07 PM  Result Value Ref Range   Glucose-Capillary 196 (H) 65 - 99 mg/dL  Glucose, capillary     Status: Abnormal   Collection Time: 12/20/17  6:36 AM  Result Value Ref Range   Glucose-Capillary 125 (H) 65 - 99 mg/dL  Glucose, capillary     Status: Abnormal   Collection Time: 12/20/17 12:05 PM  Result Value Ref Range   Glucose-Capillary 132 (H) 65 - 99 mg/dL  Glucose, capillary     Status: Abnormal   Collection Time: 12/20/17  5:28 PM  Result Value Ref Range   Glucose-Capillary 196 (H) 65 - 99 mg/dL  Glucose, capillary     Status: Abnormal   Collection Time: 12/20/17  9:24 PM  Result Value Ref Range   Glucose-Capillary 196 (H) 65 - 99 mg/dL  Glucose, capillary     Status: Abnormal   Collection Time: 12/21/17  6:36 AM  Result Value Ref Range   Glucose-Capillary 125 (H) 65 - 99 mg/dL  Basic metabolic panel     Status: Abnormal   Collection Time: 12/21/17  7:08 AM  Result Value Ref Range   Sodium 141 135 - 145 mmol/L   Potassium 3.3 (L) 3.5 - 5.1 mmol/L   Chloride 107 101 - 111 mmol/L   CO2  24 22 - 32 mmol/L   Glucose, Bld 138 (H) 65 - 99 mg/dL   BUN 24 (H) 6 - 20 mg/dL   Creatinine, Ser 1.43 (H) 0.44 - 1.00 mg/dL   Calcium 9.3 8.9 - 10.3 mg/dL   GFR calc non Af Amer 40 (L) >60 mL/min   GFR calc Af Amer 47 (L) >60 mL/min    Comment: (NOTE) The eGFR has been calculated using the CKD EPI equation. This calculation has not been validated in all clinical situations. eGFR's persistently <60 mL/min signify possible Chronic Kidney Disease.    Anion gap 10 5 - 15    Comment: Performed at [facility] [address], [place], [place] [address]  Glucose, capillary     Status: Abnormal   Collection Time: 12/21/17 12:01 PM  Result Value Ref Range   Glucose-Capillary 165 (H) 65 - 99 mg/dL  Glucose, capillary     Status: Abnormal   Collection Time: 12/21/17  4:42 PM  Result Value Ref Range   Glucose-Capillary 128 (H) 65 - 99 mg/dL  Glucose, capillary     Status: Abnormal   Collection Time: 12/21/17  9:26 PM  Result Value Ref Range   Glucose-Capillary 122 (H) 65 - 99 mg/dL  Glucose, capillary     Status: None   Collection Time: 12/22/17  6:32 AM  Result Value Ref Range   Glucose-Capillary 95 65 - 99 mg/dL     Constitutional: No distress . Vital signs reviewed. HEENT: EOMI, oral membranes moist Neck: supple Cardiovascular: RRR without murmur. No JVD    Respiratory: CTA Bilaterally without wheezes or rales. Normal effort    GI: BS +, non-tender, non-distended  Skin:   Intact Neuro: Alert/Oriented, Normal Sensory, Abnormal Motor 0/5 RUE adn RLE , 5/5 on left side and Dysarthric speech. Good insight and awareness. Tone MAS 3 at the pectoralis MAS 2 at the biceps Musc/Skel:  Normal Psych: in good spirits   Assessment/Plan: 1. Functional deficits secondary to Right hemiplegia left subcortical infarcts which require 3+ hours per day of interdisciplinary therapy in a comprehensive inpatient rehab setting. Physiatrist is providing close team supervision and 24 hour  management of active medical problems listed below. Physiatrist and rehab team continue to assess barriers to discharge/monitor patient progress toward functional and medical goals. FIM: Function - Bathing Position: Wheelchair/chair at sink Body parts bathed by patient: Chest, Abdomen, Right upper leg, Left upper leg, Right arm, Right lower leg, Left lower leg, Front perineal area Body parts bathed by helper: Left arm, Buttocks, Back Assist Level: Touching or steadying assistance(Pt > 75%)  Function- Upper Body Dressing/Undressing What is the patient wearing?: Pull over shirt/dress Pull over shirt/dress - Perfomed by patient: Thread/unthread left sleeve, Thread/unthread right sleeve, Pull shirt over trunk, Put head through opening Pull over shirt/dress - Perfomed by helper: Put head through opening Assist Level: Touching or steadying assistance(Pt > 75%) Function - Lower Body Dressing/Undressing What is the patient wearing?: Pants, Shoes, [employer], Underwear Position: Education officer, museum at [employer] - Performed by patient: Thread/unthread right underwear leg, Thread/unthread left underwear leg, Pull underwear up/down Pants- Performed by patient: Thread/unthread right pants leg, Thread/unthread left pants leg, Pull pants up/down Pants- Performed by helper: Pull pants up/down, Thread/unthread right pants leg Shoes - Performed by patient: Don/doff right shoe, Don/doff left shoe Shoes - Performed by helper: Fasten right, Fasten left TED Hose - Performed by helper: Don/doff right TED hose, Don/doff left TED hose Assist for footwear: Maximal assist Assist for lower body dressing: Touching or steadying assistance (Pt > 75%)  Function - Toileting Toileting activity did not occur: No continent bowel/bladder event Toileting steps completed by patient: Performs perineal hygiene Toileting steps completed by helper: Adjust clothing prior to toileting, Performs perineal hygiene, Adjust clothing  after toileting Toileting Assistive Devices: Grab bar or rail Assist level: Two helpers  Function - Air cabin crew transfer assistive device: Radio broadcast assistant lift: [place] level to toilet: Maximal assist (Pt 25 - 49%/lift and lower) Assist level from toilet: Maximal assist (Pt 25 - 49%/lift and lower)  Function - Chair/bed transfer Chair/bed transfer method: Squat pivot Chair/bed transfer assist level: Moderate assist (Pt 50 - 74%/lift or lower) Chair/bed transfer assistive device: Armrests Chair/bed transfer details: Tactile cues for sequencing, Tactile cues for weight shifting, Tactile cues for posture, Tactile cues for placement, Verbal cues for technique, Verbal cues for sequencing, Manual facilitation for placement, Manual facilitation for weight shifting  Function - Locomotion: Wheelchair Type: Manual Max wheelchair distance: 125 ft Assist Level: Supervision or verbal cues Assist Level: Supervision or verbal cues Wheel 150 feet activity did not occur: Safety/medical concerns Turns around,maneuvers to table,bed, and toilet,negotiates 3% grade,maneuvers on rugs and over doorsills: No Function - Locomotion: Ambulation Assistive device: [employer]  in hallway Max distance: 30 ft Assist level: 2 helpers(max assist + w/c follow for safety) Assist level: 2 helpers Walk 50 feet with 2 turns activity did not occur: Safety/medical concerns Walk 150 feet activity did not occur: Safety/medical concerns Walk 10 feet on uneven surfaces activity did not occur: Safety/medical concerns  Function - Comprehension Comprehension: Auditory Comprehension assist level: Understands basic 90% of the time/cues < 10% of the time  Function - Expression Expression: Verbal Expression assist level: Expresses basic 75 - 89% of the time/requires cueing 10 - 24% of the time. Needs helper to occlude trach/needs to repeat words.  Function - Social Interaction Social Interaction assist level:  Interacts appropriately 90% of the time - Needs monitoring or encouragement for participation or interaction.  Function - Problem Solving Problem solving assist level: Solves basic 75 - 89% of the time/requires cueing 10 - 24% of the time, Solves basic 90% of the time/requires cueing < 10% of the time  Function - Memory Memory assist level: Recognizes or recalls 75 - 89% of the time/requires cueing 10 - 24% of the time Patient normally able to recall (first 3 days only): Current season, That he or she is in a hospital, Staff names and faces  Medical Problem List and Plan: 1.Right hemiplegia with dysarthria and dysphagiasecondary to left periventricular white matter left corona radiata infarctions as well as history of left brain infarction March 2019  -continue therapies, Team conference today please see physician documentation under team conference tab, met with team face-to-face to discuss problems,progress, and goals. Formulized individual treatment plan based on medical history, underlying problem and comorbidities. , 2. DVT Prophylaxis/Anticoagulation: Subcutaneous Lovenox. Monitor for any bleeding episodes 3. Pain Management:Tylenol as needed 4. Mood:Provide emotional support 5. Neuropsych: This patientiscapable of making decisions on herown behalf. 6. Skin/Wound Care:Routine skin checks 7. Fluids/Electrolytes/Nutrition: encourage PO  -BUN was trending up, encourage p.o. will not start IV fluids at this time, fluid intake 12/16/2017 was 300 mL.  Recheck BMET  8.Dysphagia. Dysphagia #2 thin liquids. Follow-up speech therapy 9.Hypertension. Norvasc 5 mg daily, lisinopril 40 mg daily Vitals:   12/21/17 1454 12/21/17 1959  BP: (!) 147/81 (!) 148/84  Pulse: 85 76  Resp: 18 18  Temp: 97.7 F (36.5 C) 97.8 F (36.6 C)  SpO2: 100% 100%   elevated diastolic 5/63-JSHFWYO, increase amlodipine to 67m-controlled 5/27 10.Diabetes mellitus with peripheral neuropathy.  Hemoglobin A1c 8.4. Sliding scale insulin. Check blood sugars before meals and at bedtime. Patient on DiaBeta 2.5 mg daily, insulin glargine 10 units nightly. Resume as needed CBG (last 3)  Recent Labs    12/21/17 1642 12/21/17 2126 12/22/17 0632  GLUCAP 128* 122* 95  fair in hospital control 5/29, cont glyburide 2.51m qam 11.Hyperlipidemia. Crestor 12.COPD with tobacco abuse. NicoDerm patch. Provide counseling. Inhalers as directed. 13.5 mm right upper lobe nodule with nonspecific mildly enlarged mediastinal bilateral hilar lymph nodes per CT of the chest 12/08/2017. Recommendations are for noncontrast chest CT 12 months for evaluation 14.  HypoK mild likely nutritional started KCL 1032m5/23, still low increase to 30m93m/27, cont this dose for now  15.  Spasticity will start low-dose tizanidine continue PT and OT- no evidence of sedation, increase to 4mg 3m 16.  ARF- D/C ACE and Monitor, enc fluids, recheck BMET Stable, cont enc fluids 17.  Constipation improved, cont.  senna S to 2 po BID,  LOS (Days) 13 A FACE TO FACE EVALUATION WAS PERFORMED  AndreCharlett Blake/2019, 9:17 AM

## 2017-12-22 NOTE — Progress Notes (Signed)
Physical Therapy Session Note  Patient Details  Name: Kelly Gibson MRN: [id number] Date of Birth: Jun 16, 1961  Today's Date: 12/22/2017 PT Individual Time: 1404-1500 PT Individual Time Calculation (min): 56 min   Short Term Goals: Week 3:  PT Short Term Goal 1 (Week 3): STG = LTG due to estimated d/c date.  Skilled Therapeutic Interventions/Progress Updates:  Pt received in w/c & agreeable to tx. No c/o pain reported but pt c/o significant fatigue & rest breaks taken PRN. Pt propels w/c throughout unit with L hemi technique and supervision with occasional cuing for steering and significantly extra time overall. Pt completes car transfer at [place] simulated height (no car measurement on home measurement sheet but husband & pt reporting she has either a [place] SUV or Lapel with therapist recommending the sedan). Pt requires total assist for w/c set up & parts management, and max cuing for technique for squat pivot. Pt requires mod<>max assist overall for car transfer with therapist providing manual facilitation for weight shifting and multimodal cuing for anterior weight shifting as pt with poor awareness of head/hips relationship. Pt completed transfer to/from low, compliant couch in apartment with mod assist for multiple squat pivots. Pt propels w/c through apartment with supervision. Educated pt on need for family to complete all cooking tasks in kitchen & pt voiced understanding. At end of session pt left in w/c with quick release belt & chair alarm donned, needs within reach.   Therapy Documentation Precautions:  Precautions Precautions: Fall Precaution Comments: bradycardia Restrictions Weight Bearing Restrictions: No RUE Weight Bearing: Weight bearing as tolerated RLE Weight Bearing: Weight bearing as tolerated    See Function Navigator for Current Functional Status.   Therapy/Group: Individual Therapy  Waunita Schooner 12/22/2017, 3:01 PM

## 2017-12-22 NOTE — Progress Notes (Signed)
Speech Language Pathology Daily Session Note  Patient Details  Name: Kelly Gibson MRN: [id number] Date of Birth: 19-May-1961  Today's Date: 12/22/2017 SLP Individual Time:  -     Short Term Goals: Week 2: SLP Short Term Goal 1 (Week 2): Patient will recall new, daily information with use of external aids with Min A multimodal cues.  SLP Short Term Goal 2 (Week 2): Patient will demonstrate functional problem solving for basic and familiar tasks with supervision verbal cues.  SLP Short Term Goal 3 (Week 2): Patient will utilize speech intelligibility strategies at the sentence level with Min A verbal cues to achieve 90% of intelligibility.  SLP Short Term Goal 4 (Week 2): Patient will consume trials of regular textures and demonstrate efficient mastication with complete oral clearance without overt s/s of aspiration over 2 sessions prior to upgrade with supervision verbal cues.   Skilled Therapeutic Interventions: Skilled ST services focused on swallow and cognitive skills. SLP facilitated mildly complex problem solving skills utilizing ALFA money management requiring supervision A verbal cues and sequencing 4-5 cards requiring initial Min A cues fading to supervision A verbal cues. Pt required supervision A verbal cues in structured sentences tasks and min A verbal cues for speech intelligibility in unstructured tasks. Pt consumed PO trial of regular snack demonstrated Mod I use of swallow strategies to clear oral cavity. Pt demonstrated recall of today's OT activities and SLP activities with supervision A visual cues and SLP recorded events in memory notebook.  Pt was left in room with call bell within reach. Recommend to continue skilled ST services.       Function:  Eating Eating   Modified Consistency Diet: No(regular trial ) Eating Assist Level: Set up assist for   Eating Set Up Assist For: Opening containers       Cognition Comprehension Comprehension assist level: Follows basic  conversation/direction with extra time/assistive device  Expression   Expression assist level: Expresses basic 75 - 89% of the time/requires cueing 10 - 24% of the time. Needs helper to occlude trach/needs to repeat words.;Expresses basic 90% of the time/requires cueing < 10% of the time.  Social Interaction Social Interaction assist level: Interacts appropriately 90% of the time - Needs monitoring or encouragement for participation or interaction.  Problem Solving Problem solving assist level: Solves basic 90% of the time/requires cueing < 10% of the time;Solves basic 75 - 89% of the time/requires cueing 10 - 24% of the time;Solves basic problems with no assist;Solves complex 90% of the time/cues < 10% of the time  Memory Memory assist level: Recognizes or recalls 75 - 89% of the time/requires cueing 10 - 24% of the time    Pain Pain Assessment Pain Score: 0-No pain  Therapy/Group: Individual Therapy  Augusta Mirkin  [facility] 12/22/2017, 10:22 AM

## 2017-12-22 NOTE — Plan of Care (Signed)
  Problem: Consults Goal: RH STROKE PATIENT EDUCATION Description See Patient Education module for education specifics  Outcome: Progressing   Problem: RH BLADDER ELIMINATION Goal: RH STG MANAGE BLADDER WITH ASSISTANCE Description STG Manage Bladder With Min Assistance  Outcome: Progressing   Problem: RH SAFETY Goal: RH STG ADHERE TO SAFETY PRECAUTIONS W/ASSISTANCE/DEVICE Description STG Adhere to Safety Precautions With Assistance/Device. With cues and reminders.  Outcome: Progressing Goal: RH STG DECREASED RISK OF FALL WITH ASSISTANCE Description STG Decreased Risk of Fall With Assistance. Cues and reminders.  Outcome: Progressing   Problem: RH KNOWLEDGE DEFICIT Goal: RH STG INCREASE KNOWLEDGE OF DIABETES Description Pt.'s husband will be able to demonstrate knowledge of how to use medications, CBG monitoring, diet with cues and reminders.  Outcome: Progressing Goal: RH STG INCREASE KNOWLEDGE OF HYPERTENSION Description Husband will be able to demonstrate knowledge of medications and diet with cues and reminders.  Outcome: Progressing

## 2017-12-22 NOTE — Patient Care Conference (Signed)
Inpatient RehabilitationTeam Conference and Plan of Care Update Date: 12/22/2017   Time: 10:45 AM     Patient Name: Kelly Gibson      Medical Record Number: [id number]  Date of Birth: May 04, 1961 Sex: Female         Room/Bed: 4W23C/4W23C-01 Payor Info: Payor: BLUE CROSS BLUE SHIELD / Plan: BCBS OTHER / Product Type: *No Product type* /    Admitting Diagnosis: L CVA  Admit Date/Time:  12/09/2017  3:57 PM Admission Comments: No comment available   Primary Diagnosis:  <principal problem not specified> Principal Problem: <principal problem not specified>  Patient Active Problem List   Diagnosis Date Noted  . CVA (cerebral vascular accident) ([place]) 12/09/2017  . Hemiparesis affecting right side as late effect of cerebrovascular accident (CVA) ([place])   . Dysarthria, post-stroke   . Gait disturbance, post-stroke   . Acute CVA (cerebrovascular accident) ([place]) 12/05/2017  . Bradycardia   . Essential hypertension   . Weakness 10/26/2017  . Cerebrovascular disease 10/18/2017  . COPD (chronic obstructive pulmonary disease) ([place]) 02/18/2015  . Loss of weight 03/15/2014  . Non compliance w medication regimen 06/17/2013  . Post-menopause bleeding 06/16/2013  . Bronchitis 05/04/2012  . Tobacco user 05/04/2012  . Ganglion cyst 03/09/2012  . Anxiety 03/09/2012  . Essential hypertension, benign 12/17/2011  . Obesity, unspecified 12/17/2011  . Hyperlipidemia 12/17/2011  . Depression 12/17/2011  . Diabetes mellitus type II, uncontrolled ([place]) 12/14/2011    Expected Discharge Date: Expected Discharge Date: 12/30/17  Team Members Present: Physician leading conference: Dr. Alysia Penna Social Worker Present: Ovidio Kin, LCSW Nurse Present: Other (comment)(Mekides Nida-LPN) PT Present: Lavone Nian, PT OT Present: Napoleon Form, OT SLP Present: Stormy Fabian, SLP PPS Coordinator present : Daiva Nakayama, RN, CRRN     Current Status/Progress Goal Weekly Team Focus  Medical   Spasticity,  min response to therapy  reduce fall risk  Adjust tizanidine   Bowel/Bladder   continent of B/B; decreased urinary output  Maintain continence  Assist with toileting as needed   Swallow/Nutrition/ Hydration   Dys 3 and thin, Sup A  Supervision A  trials of regular for diet upgrade    ADL's   Min A UB bathing/dressing, max A toileting, mod A LB dressing; mod A functional transfers  Downgraded to min-mod A overall  ADL re-training, functional transfers, neuro re-ed, family ed, d/c planning   Mobility   min/mod assist squat pivot transfers, min assist w/c mobility, max assist gait with rial  goals downgraded to min assist transfers except mod assist car transfer, supervision w/c mobility, min assist bed mobility  transfers, R NMR, balance, w/c mobility, strengthening, endurance   Communication   Min A sentence level   Supervision A  speech intelligibility strategies   Safety/Cognition/ Behavioral Observations  Min A  MIn A - Supervison A  recall, problem solving    Pain   Denies pain  Pain < 2  Assess qshift and prn   Skin   Skin intact  Prevent breakdown  Assess qshift and prn      *See Care Plan and progress notes for long and short-term goals.     Barriers to Discharge  Current Status/Progress Possible Resolutions Date Resolved   Physician    Decreased caregiver support;Medical stability                             progress  Cont rehab      Nursing  PT  Decreased caregiver support;Home environment access/layout;Lack of/limited family support  bed/bath on 2nd floor with flights of stairs to access, husband works & pt needs 24 hr assist upon d/c              OT                  SLP                SW                Discharge Planning/Teaching Needs:  According to husband he has pt's care covered. He will be there the first week and between he and his daughter's they will provide care. Aware grandchildren-teens can not be the caregivers.      Team  Discussion:  Progressing slowly toward her goals of min assist wheelchair level. Endurance poor and needs many rest breaks. Recall and problem solving poor and will need someone with her. Spascity MD is addressing in her arm and leg. Begin family training with husband.   Revisions to Treatment Plan:  DC 6/6    Continued Need for Acute Rehabilitation Level of Care: The patient requires daily medical management by a physician with specialized training in physical medicine and rehabilitation for the following conditions: Daily direction of a multidisciplinary physical rehabilitation program to ensure safe treatment while eliciting the highest outcome that is of practical value to the patient.: Yes Daily medical management of patient stability for increased activity during participation in an intensive rehabilitation regime.: Yes Daily analysis of laboratory values and/or radiology reports with any subsequent need for medication adjustment of medical intervention for : Neurological problems  Chika Cichowski, Gardiner Rhyme 12/24/2017, 8:23 AM

## 2017-12-22 NOTE — Progress Notes (Signed)
Physical Therapy Session Note  Patient Details  Name: Kelly Gibson MRN: [id number] Date of Birth: 10/17/1960  Today's Date: 12/22/2017 PT Individual Time: 0122-2411 PT Individual Time Calculation (min): 24 min   Short Term Goals: Week 2:  PT Short Term Goal 1 (Week 2): Pt will propel w/c 50 ft with supervision. PT Short Term Goal 1 - Progress (Week 2): Met PT Short Term Goal 2 (Week 2): Pt will complete bed<>w/c transfers with min assist.  PT Short Term Goal 2 - Progress (Week 2): Partly met(met 50% of the time, meets when transferring to L, mod assist when transferring to R) PT Short Term Goal 3 (Week 2): Pt will ambulate 30 ft with max assist +1 for NMR & strengthening. PT Short Term Goal 3 - Progress (Week 2): Met  Skilled Therapeutic Interventions/Progress Updates:    no c/o pain but reports fatigue.  Session focus on w/c mobility, transfers, and bed mobility in flat bed.    Pt requiring min assist to propel w/c 50' this session, with frequent cues for technique and to avoid veering R/obstacles on R.  Transfers from w/c with mod assist overall to L side with cues for weight shifting forward and pushing throughout LEs.  Pt completes bed mobility on flat bed with cues for hand placement and to go sit>side lying>supine.  Positioned to comfort in bed at end of session with call bell in reach and needs met.   Therapy Documentation Precautions:  Precautions Precautions: Fall Precaution Comments: bradycardia Restrictions Weight Bearing Restrictions: No RUE Weight Bearing: Weight bearing as tolerated RLE Weight Bearing: Weight bearing as tolerated   See Function Navigator for Current Functional Status.   Therapy/Group: Individual Therapy  Michel Santee 12/22/2017, 5:05 PM

## 2017-12-23 ENCOUNTER — Inpatient Hospital Stay (HOSPITAL_COMMUNITY): Payer: BLUE CROSS/BLUE SHIELD | Admitting: Occupational Therapy

## 2017-12-23 ENCOUNTER — Inpatient Hospital Stay (HOSPITAL_COMMUNITY): Payer: BLUE CROSS/BLUE SHIELD

## 2017-12-23 ENCOUNTER — Inpatient Hospital Stay (HOSPITAL_COMMUNITY): Payer: BLUE CROSS/BLUE SHIELD | Admitting: Speech Pathology

## 2017-12-23 LAB — GLUCOSE, CAPILLARY
GLUCOSE-CAPILLARY: 179 mg/dL — AB (ref 65–99)
GLUCOSE-CAPILLARY: 131 mg/dL — AB (ref 65–99)
Glucose-Capillary: 97 mg/dL (ref 65–99)
Glucose-Capillary: 133 mg/dL — ABNORMAL HIGH (ref 65–99)

## 2017-12-23 LAB — CREATININE, SERUM
Creatinine, Ser: 1.31 mg/dL — ABNORMAL HIGH (ref 0.44–1.00)
GFR calc non Af Amer: 45 mL/min — ABNORMAL LOW (ref >60)
GFR, EST AFRICAN AMERICAN: 52 mL/min — AB (ref >60)

## 2017-12-23 MED ORDER — BACLOFEN 5 MG HALF TABLET
5.0000 mg | ORAL_TABLET | Freq: Three times a day (TID) | ORAL | Status: DC
  Administered 2017-12-23 – 2017-12-27 (×11): 5 mg via ORAL
  Filled 2017-12-23 (×11): qty 1

## 2017-12-23 NOTE — Progress Notes (Signed)
Speech Language Pathology Weekly Progress and Session Note  Patient Details  Name: Kelly Gibson MRN: [id number] Date of Birth: 08/27/1960  Beginning of progress report period: Dec 17, 2017 End of progress report period: Dec 23, 2017  Today's Date: 12/23/2017 SLP Individual Time: 1030-1100 SLP Individual Time Calculation (min): 30 min  Short Term Goals: Week 2: SLP Short Term Goal 1 (Week 2): Patient will recall new, daily information with use of external aids with Min A multimodal cues.  SLP Short Term Goal 1 - Progress (Week 2): Met SLP Short Term Goal 2 (Week 2): Patient will demonstrate functional problem solving for basic and familiar tasks with supervision verbal cues.  SLP Short Term Goal 2 - Progress (Week 2): Met SLP Short Term Goal 3 (Week 2): Patient will utilize speech intelligibility strategies at the sentence level with Min A verbal cues to achieve 90% of intelligibility.  SLP Short Term Goal 3 - Progress (Week 2): Met SLP Short Term Goal 4 (Week 2): Patient will consume trials of regular textures and demonstrate efficient mastication with complete oral clearance without overt s/s of aspiration over 2 sessions prior to upgrade with supervision verbal cues.  SLP Short Term Goal 4 - Progress (Week 2): Not met    New Short Term Goals: Week 3: SLP Short Term Goal 1 (Week 3): Patient will recall new, daily information with use of external aids with Supervision multimodal cues.  SLP Short Term Goal 2 (Week 3): Patient will demonstrate functional problem solving for mildy complex tasks with Min verbal cues.  SLP Short Term Goal 3 (Week 3): Patient will utilize speech intelligibility strategies at the sentence level with supervision verbal cues to achieve 90% of intelligibility.  SLP Short Term Goal 4 (Week 3): Patient will consume trials of regular textures and demonstrate efficient mastication with complete oral clearance without overt s/s of aspiration over 2 sessions prior to  upgrade with supervision verbal cues.   Weekly Progress Updates: Patient has made excellent gains and has met 3 of 4 STG's this reporting period. Currently, patient is consuming Dys. 3 textures with thin liquids without overt s/s of aspiration with Mod I for use of swallowing compensatory strategies. Continued trials of regular textures are needed this week prior to upgrade. Patient is currently~90% intelligible at the sentence level with Min A verbal cues for use of speech intelligibility strategies and requires overall Min A verbal cues for recall with use of external aids and supervision verbal cues for problem solving. Patient and family education is ongoing. Patient would benefit from continued skilled SLP intervention to maximize her swallowing, cognitive and speech function prior to discharge.      Intensity: Minumum of 1-2 x/day, 30 to 90 minutes Frequency: 3 to 5 out of 7 days Duration/Length of Stay: 12/30/17 Treatment/Interventions: Cognitive remediation/compensation;Environmental controls;Internal/external aids;Speech/Language facilitation;Therapeutic Activities;Patient/family education;Functional tasks;Dysphagia/aspiration precaution training;Cueing hierarchy   Daily Session  Skilled Therapeutic Interventions: Skilled treatment session focused on cognitive goals. SLP facilitated session by providing Min A verbal and question cues for problem solving during a mildly complex calendar making task. Throughout task, patient required Max A verbal cues to use her LUE while writing instead of attempting with her RUE. Patient left upright in wheelchair with all needs within reach. Continue with current plan of care.       Function:   Cognition Comprehension Comprehension assist level: Follows basic conversation/direction with extra time/assistive device  Expression   Expression assist level: Expresses basic 90% of the time/requires cueing < 10%  of the time.  Social Interaction Social  Interaction assist level: Interacts appropriately 90% of the time - Needs monitoring or encouragement for participation or interaction.  Problem Solving Problem solving assist level: Solves basic 90% of the time/requires cueing < 10% of the time  Memory Memory assist level: Recognizes or recalls 75 - 89% of the time/requires cueing 10 - 24% of the time   Pain No/Denies Pain   Therapy/Group: Individual Therapy  Robbyn Hodkinson 12/23/2017, 3:49 PM

## 2017-12-23 NOTE — Progress Notes (Signed)
Subjective/Complaints:   still has tightness in Right arm and shoulder As well as increased tone in RLE hanstrings when standing as per OT  ROS: Patient denies chest pain, shortness of breath, nausea vomiting diarrhea constipation  Objective: Vital Signs: Blood pressure (!) 141/83, pulse 74, temperature 98 F (36.7 C), resp. rate 18, height 5' 2"  (1.575 m), weight 67.4 kg (148 lb 9.4 oz), last menstrual period 12/10/2012, SpO2 98 %. No results found. Results for orders placed or performed during the hospital encounter of 12/09/17 (from the past 72 hour(s))  Glucose, capillary     Status: Abnormal   Collection Time: 12/20/17 12:05 PM  Result Value Ref Range   Glucose-Capillary 132 (H) 65 - 99 mg/dL  Glucose, capillary     Status: Abnormal   Collection Time: 12/20/17  5:28 PM  Result Value Ref Range   Glucose-Capillary 196 (H) 65 - 99 mg/dL  Glucose, capillary     Status: Abnormal   Collection Time: 12/20/17  9:24 PM  Result Value Ref Range   Glucose-Capillary 196 (H) 65 - 99 mg/dL  Glucose, capillary     Status: Abnormal   Collection Time: 12/21/17  6:36 AM  Result Value Ref Range   Glucose-Capillary 125 (H) 65 - 99 mg/dL  Basic metabolic panel     Status: Abnormal   Collection Time: 12/21/17  7:08 AM  Result Value Ref Range   Sodium 141 135 - 145 mmol/L   Potassium 3.3 (L) 3.5 - 5.1 mmol/L   Chloride 107 101 - 111 mmol/L   CO2 24 22 - 32 mmol/L   Glucose, Bld 138 (H) 65 - 99 mg/dL   BUN 24 (H) 6 - 20 mg/dL   Creatinine, Ser 1.43 (H) 0.44 - 1.00 mg/dL   Calcium 9.3 8.9 - 10.3 mg/dL   GFR calc non Af Amer 40 (L) >60 mL/min   GFR calc Af Amer 47 (L) >60 mL/min    Comment: (NOTE) The eGFR has been calculated using the CKD EPI equation. This calculation has not been validated in all clinical situations. eGFR's persistently <60 mL/min signify possible Chronic Kidney Disease.    Anion gap 10 5 - 15    Comment: Performed at [facility] [address]  [place], [place] [address]  Glucose, capillary     Status: Abnormal   Collection Time: 12/21/17 12:01 PM  Result Value Ref Range   Glucose-Capillary 165 (H) 65 - 99 mg/dL  Glucose, capillary     Status: Abnormal   Collection Time: 12/21/17  4:42 PM  Result Value Ref Range   Glucose-Capillary 128 (H) 65 - 99 mg/dL  Glucose, capillary     Status: Abnormal   Collection Time: 12/21/17  9:26 PM  Result Value Ref Range   Glucose-Capillary 122 (H) 65 - 99 mg/dL  Glucose, capillary     Status: None   Collection Time: 12/22/17  6:32 AM  Result Value Ref Range   Glucose-Capillary 95 65 - 99 mg/dL  Glucose, capillary     Status: Abnormal   Collection Time: 12/22/17 11:50 AM  Result Value Ref Range   Glucose-Capillary 238 (H) 65 - 99 mg/dL  Glucose, capillary     Status: Abnormal   Collection Time: 12/22/17  4:51 PM  Result Value Ref Range   Glucose-Capillary 143 (H) 65 - 99 mg/dL  Glucose, capillary     Status: Abnormal   Collection Time: 12/22/17  8:59 PM  Result Value Ref Range   Glucose-Capillary  150 (H) 65 - 99 mg/dL  Glucose, capillary     Status: None   Collection Time: 12/23/17  6:52 AM  Result Value Ref Range   Glucose-Capillary 97 65 - 99 mg/dL  Creatinine, serum     Status: Abnormal   Collection Time: 12/23/17  7:04 AM  Result Value Ref Range   Creatinine, Ser 1.31 (H) 0.44 - 1.00 mg/dL   GFR calc non Af Amer 45 (L) >60 mL/min   GFR calc Af Amer 52 (L) >60 mL/min    Comment: (NOTE) The eGFR has been calculated using the CKD EPI equation. This calculation has not been validated in all clinical situations. eGFR's persistently <60 mL/min signify possible Chronic Kidney Disease. Performed at [facility], [place] [address], [place], [place] [address]      Constitutional: No distress . Vital signs reviewed. HEENT: EOMI, oral membranes moist Neck: supple Cardiovascular: RRR without murmur. No JVD    Respiratory: CTA Bilaterally without wheezes or rales. Normal effort     GI: BS +, non-tender, non-distended  Skin:   Intact Neuro: Alert/Oriented, Normal Sensory, Abnormal Motor 0/5 RUE adn RLE , 5/5 on left side and Dysarthric speech. Good insight and awareness. Tone MAS 3 at the pectoralis MAS 2 at the biceps MAS 2 in Right hamstring, clonus at R ankle Musc/Skel:  Normal Psych: in good spirits   Assessment/Plan: 1. Functional deficits secondary to Right hemiplegia left subcortical infarcts which require 3+ hours per day of interdisciplinary therapy in a comprehensive inpatient rehab setting. Physiatrist is providing close team supervision and 24 hour management of active medical problems listed below. Physiatrist and rehab team continue to assess barriers to discharge/monitor patient progress toward functional and medical goals. FIM: Function - Bathing Position: Wheelchair/chair at sink Body parts bathed by patient: Chest, Abdomen, Right upper leg, Left upper leg, Right arm, Right lower leg, Left lower leg, Front perineal area Body parts bathed by helper: Left arm, Buttocks, Back Assist Level: Touching or steadying assistance(Pt > 75%)  Function- Upper Body Dressing/Undressing What is the patient wearing?: Pull over shirt/dress Pull over shirt/dress - Perfomed by patient: Thread/unthread left sleeve, Thread/unthread right sleeve, Pull shirt over trunk, Put head through opening Pull over shirt/dress - Perfomed by helper: Put head through opening Assist Level: Touching or steadying assistance(Pt > 75%) Function - Lower Body Dressing/Undressing What is the patient wearing?: Pants, Shoes, [employer], Underwear Position: Education officer, museum at [employer] - Performed by patient: Thread/unthread right underwear leg, Thread/unthread left underwear leg, Pull underwear up/down Pants- Performed by patient: Thread/unthread right pants leg, Thread/unthread left pants leg, Pull pants up/down Pants- Performed by helper: Pull pants up/down, Thread/unthread right pants  leg Shoes - Performed by patient: Don/doff right shoe, Don/doff left shoe Shoes - Performed by helper: Fasten right, Fasten left TED Hose - Performed by helper: Don/doff right TED hose, Don/doff left TED hose Assist for footwear: Maximal assist Assist for lower body dressing: Touching or steadying assistance (Pt > 75%)  Function - Toileting Toileting activity did not occur: No continent bowel/bladder event Toileting steps completed by patient: Performs perineal hygiene Toileting steps completed by helper: Adjust clothing prior to toileting, Performs perineal hygiene, Adjust clothing after toileting Toileting Assistive Devices: Grab bar or rail Assist level: Two helpers  Function - Air cabin crew transfer assistive device: Radio broadcast assistant lift: [place] level to toilet: Maximal assist (Pt 25 - 49%/lift and lower) Assist level from toilet: Maximal assist (Pt 25 - 49%/lift and lower) Assist  level to bedside commode (at bedside): Maximal assist (Pt 25 - 49%/lift and lower) Assist level from bedside commode (at bedside): Maximal assist (Pt 25 - 49%/lift and lower)  Function - Chair/bed transfer Chair/bed transfer method: Squat pivot Chair/bed transfer assist level: Moderate assist (Pt 50 - 74%/lift or lower) Chair/bed transfer assistive device: Armrests Chair/bed transfer details: Tactile cues for sequencing, Tactile cues for weight shifting, Tactile cues for posture, Tactile cues for placement, Verbal cues for technique, Verbal cues for sequencing, Manual facilitation for placement, Manual facilitation for weight shifting  Function - Locomotion: Wheelchair Will patient use wheelchair at discharge?: Yes Type: Manual Max wheelchair distance: 50 Assist Level: Touching or steadying assistance (Pt > 75%) Assist Level: Touching or steadying assistance (Pt > 75%) Wheel 150 feet activity did not occur: Safety/medical concerns Turns around,maneuvers to table,bed, and  toilet,negotiates 3% grade,maneuvers on rugs and over doorsills: No Function - Locomotion: Ambulation Assistive device: Rail in hallway Max distance: 30 ft Assist level: 2 helpers(max assist + w/c follow for safety) Assist level: 2 helpers Walk 50 feet with 2 turns activity did not occur: Safety/medical concerns Walk 150 feet activity did not occur: Safety/medical concerns Walk 10 feet on uneven surfaces activity did not occur: Safety/medical concerns  Function - Comprehension Comprehension: Auditory Comprehension assist level: Follows basic conversation/direction with extra time/assistive device  Function - Expression Expression: Verbal Expression assist level: Expresses basic 75 - 89% of the time/requires cueing 10 - 24% of the time. Needs helper to occlude trach/needs to repeat words., Expresses basic 90% of the time/requires cueing < 10% of the time.  Function - Social Interaction Social Interaction assist level: Interacts appropriately 90% of the time - Needs monitoring or encouragement for participation or interaction.  Function - Problem Solving Problem solving assist level: Solves basic 90% of the time/requires cueing < 10% of the time, Solves basic 75 - 89% of the time/requires cueing 10 - 24% of the time, Solves basic problems with no assist, Solves complex 90% of the time/cues < 10% of the time  Function - Memory Memory assist level: Recognizes or recalls 75 - 89% of the time/requires cueing 10 - 24% of the time Patient normally able to recall (first 3 days only): Current season, That he or she is in a hospital, Staff names and faces  Medical Problem List and Plan: 1.Right hemiplegia with dysarthria and dysphagiasecondary to left periventricular white matter left corona radiata infarctions as well as history of left brain infarction March 2019  -continue therapies, , 2. DVT Prophylaxis/Anticoagulation: Subcutaneous Lovenox. Monitor for any bleeding episodes 3. Pain  Management:Tylenol as needed 4. Mood:Provide emotional support 5. Neuropsych: This patientiscapable of making decisions on herown behalf. 6. Skin/Wound Care:Routine skin checks 7. Fluids/Electrolytes/Nutrition: encourage PO  -BUN was trending up, encourage p.o. will not start IV fluids at this time, fluid intake 12/16/2017 was 300 mL.  Recheck BMET  8.Dysphagia. Dysphagia #2 thin liquids. Follow-up speech therapy 9.Hypertension. Norvasc 5 mg daily, lisinopril 40 mg daily Vitals:   12/22/17 2047 12/23/17 0418  BP: 129/78 (!) 141/83  Pulse: 71 74  Resp:  18  Temp: 98.1 F (36.7 C) 98 F (36.7 C)  SpO2: 99% 98%   elevated diastolic 5/92-TWKMQKM, increase amlodipine to 74m-controlled 5/27 10.Diabetes mellitus with peripheral neuropathy. Hemoglobin A1c 8.4. Sliding scale insulin. Check blood sugars before meals and at bedtime. Patient on DiaBeta 2.5 mg daily, insulin glargine 10 units nightly. Resume as needed CBG (last 3)  Recent Labs    12/22/17 1651  12/22/17 2059 12/23/17 0652  GLUCAP 143* 150* 97  fair in hospital control 5/29, cont glyburide 2.1mg qam 11.Hyperlipidemia. Crestor 12.COPD with tobacco abuse. NicoDerm patch. Provide counseling. Inhalers as directed. 13.5 mm right upper lobe nodule with nonspecific mildly enlarged mediastinal bilateral hilar lymph nodes per CT of the chest 12/08/2017. Recommendations are for noncontrast chest CT 12 months for evaluation 14.  HypoK mild likely nutritional started KCL 170m 5/23, still low increase to 2070m5/27, cont this dose for now  15.  Spasticity will start low-dose tizanidine continue PT and OT- no evidence of sedation, increase to 4mg75mD, will add baclofen low dose 16.  ARF- D/C ACE and Monitor, enc fluids, recheck BMET Stable, cont enc fluids 17.  Constipation improved, cont.  senna S to 2 po BID,  LOS (Days) 14 A FACE TO FACE EVALUATION WAS PERFORMED  AndrCharlett Blake0/2019, 9:24 AM

## 2017-12-23 NOTE — Progress Notes (Addendum)
Physical Therapy Note  Patient Details  Name: Kelly Gibson MRN: [id number] Date of Birth: [birth year] Today's Date: 12/23/2017  tx 1:  0920-0950, 30 min individual tx Pain: none per pt  neuromuscular re-education via forced use, positioning for spasticity reduction R hamstrings and heel cords, in sitting in w/c using 12" high total step stool height, pt's bil hands on lateral side of knee, 7# weight on distal thigh, and PT providing heel cord stretch x 1 minute x 3. In supine, 5 x 1 each bil bridging, R unilateral bridging with multimodal cues.  Squat pivot w/c> softer padded mat, to L max assist. Sit> supine with set-up of R LE crossed over L at ankle, close supervision.  Rolling R with supervision; rolling L with max cues, set-up of bil LEs and RUE using hemi techniques, min assist.  Min/mod assist L side lying to sitting.  Squat pivot to return to w/c to L, min/mod assist.  W/c propulsion using hemi technique, x 50' with min assist for steering due to difficulty getting L heel to floor functionally.  Pt left resting in w/c with quick release belt applied, alarm set and all needs within reach.  tx 2:  1400-1500, 60 min individual tx Pain: none per pt  neuromuscular re-education via prolonged stretch to reduce RLE spasticity, propping R foot on mat table iwht pt sitting in w/c, x 4 minutes. PT issued pt hand out of seated hamstring stretch.  Sit> stand in //, RAFO and shoe cover, L heel lift.  Max assist balance with LUE support on bar, for wt bearing RLE; no active knee extension noted during mini squats or R><L wt shifting.  Sit> stand pivot transfer w/c> high bed in ADL room, max assist.  Pt has difficulty pivoting R foot.  Bed mobility on actual high bed, with blocked practice for supine> L side lying> sitting EOB.  Pt improved with L side lying> sitting when flexing bil hips toward chest before sliding feet off of bed (R foot on top of L), improving to min assist for elevation of  trunk only.  Squat pivot to return to w/c.  Pt left resting in w/c with alarm set and all needs within reach.    See function navigator for current status.   [name] 12/23/2017, 7:49 AM

## 2017-12-23 NOTE — Progress Notes (Signed)
Social Work Patient ID: Kelly Gibson, female   DOB: 09/18/1960, 57 y.o.   MRN: [id number]  Met with pt and husband when here to discuss team conference progress toward her goals and the need next week for family education. Team has already begun education with husband when here in the am. Both are agreeable to the plan and continue to voice she will have 24 hr care at home. Will work on discharge needs for next week.

## 2017-12-23 NOTE — Progress Notes (Signed)
Occupational Therapy Weekly Progress Note  Patient Details  Name: Kelly Gibson MRN: [id number] Date of Birth: 04/02/1961  Beginning of progress report period: Dec 17, 2017 End of progress report period: Dec 23, 2017  Today's Date: 12/23/2017 OT Individual Time: 4098-1191 OT Individual Time Calculation (min): 75 min    Patient has met 2 of 3 short term goals. Pt is making slow but steady progress towards OT goals. She currently requires mod- occasional max A for squat pivot transfers. Pt with increased tone impacting her ability to use R UE at functional level, however, this is assisting with her static standing balance.  Pt's husband has been present throughout rehab admission and is aware of pt's current need for physical and cognitive assist. Have begun hands on training, however, will cont to need hands on training and education in prep for d/c home next week. He voices and demonstrates willing and ableness to provide this level of assist.   Patient continues to demonstrate the following deficits: abnormal posture, apraxia, cognitive deficits, disturbance of vision and hemiplegia affecting dominant side and therefore will continue to benefit from skilled OT intervention to enhance overall performance with BADL and Reduce care partner burden.  Patient not progressing toward long term goals.  See goal revision..  Plan of care revisions: Transfer goals downgraded to moderate assist. LB dressing, toileting and UE functional use goals also downgraded. See POC for goal details.   OT Short Term Goals Week 2:  OT Short Term Goal 1 (Week 2):   OT Short Term Goal 2 (Week 2): Pt will don shirt with min A OT Short Term Goal 2 - Progress (Week 2): Met OT Short Term Goal 3 (Week 2): Pt will thread B LEs into pants with steadying assist OT Short Term Goal 3 - Progress (Week 2): Progressing toward goal OT Short Term Goal 4 (Week 2): Pt will recall and utilize hemi dressing technique mod I OT Short  Term Goal 4 - Progress (Week 2): Met Week 3:  OT Short Term Goal 1 (Week 3): STG=LTG due to LOS  Skilled Therapeutic Interventions/Progress Updates:    Pt seen for OT session focusing on funtional transfers and caregiver training. Pt sitting up in w/c upon arrival with husband present and eating breakfast, already dressed and ready for tx session.  Cont d/c planning and discussion specifically for toileting tasks. Pt's home toilet not accessible via w/c for positioning of squat pivot transfer therefore recommending use of drop arm BSC and pt's husband to place grab bar along wall at home to use of standing steadying assist.  BSC set-up in simulation of home environment. Pt completed x2 trials with therapist on first trial and husband assisting on second trial. Min-mod A overall for pivot to R<> L. Pt able to stand with grab bar in front and simulated clothing management with min steadying assist and blocking of R knee. Then completed w/c>EOB> drop arm BSC in simulation of night time toileting task. Pt limited in standing due to flexor tone in R LE, unable to safely standing without UE support/ something in front of her- will cont to problem solve best and safest method for this. Pt's husband assisted with transfer back to EOB and then into w/c. Cont to require min cuing for proper set-up of w/c in prep for transfer as well as head/hip relationship. Pt left seated in w/c at end of session, R UE supported on lap tray and with pillow to facilitate external rotation and pectoral stretch. Pt  left with all needs in reach and husband present.   Therapy Documentation Precautions:   Precautions Precautions: Fall Precaution Comments: bradycardia Restrictions Weight Bearing Restrictions: No RUE Weight Bearing: Weight bearing as tolerated RLE Weight Bearing: Weight bearing as tolerated Pain:   no/denies pain ADL: ADL ADL Comments: refer to functional navigator  See Function Navigator for Current  Functional Status.   Therapy/Group: Individual Therapy  Goldia Ligman L 12/23/2017, 7:11 AM

## 2017-12-24 ENCOUNTER — Inpatient Hospital Stay (HOSPITAL_COMMUNITY): Payer: BLUE CROSS/BLUE SHIELD | Admitting: Physical Therapy

## 2017-12-24 ENCOUNTER — Inpatient Hospital Stay (HOSPITAL_COMMUNITY): Payer: BLUE CROSS/BLUE SHIELD | Admitting: Occupational Therapy

## 2017-12-24 ENCOUNTER — Inpatient Hospital Stay (HOSPITAL_COMMUNITY): Payer: BLUE CROSS/BLUE SHIELD

## 2017-12-24 ENCOUNTER — Ambulatory Visit: Payer: BLUE CROSS/BLUE SHIELD | Admitting: Family Medicine

## 2017-12-24 LAB — GLUCOSE, CAPILLARY
GLUCOSE-CAPILLARY: 200 mg/dL — AB (ref 65–99)
GLUCOSE-CAPILLARY: 114 mg/dL — AB (ref 65–99)
Glucose-Capillary: 219 mg/dL — ABNORMAL HIGH (ref 65–99)
Glucose-Capillary: 160 mg/dL — ABNORMAL HIGH (ref 65–99)

## 2017-12-24 NOTE — Progress Notes (Signed)
SLP Cancellation Note  Patient Details Name: Kelly Gibson MRN: [id number] DOB: 03-Feb-1961   Cancelled treatment:  Pt missed 60 out of 60 minutes of speech therapy this afternoon due to being off the floor with a family member.     Emir Nack A Amias Hutchinson 12/24/2017, 1:13 PM

## 2017-12-24 NOTE — Progress Notes (Signed)
Up to BR with stedy. (+) spasms. Scheduled zanaflex given, 1 hour later patient lethargic. Vitals and CBG checked. Husband reports, the same thing happened yesterday after med was given. Spoke with Linna Hoff, PA R/T above info. Kelly Gibson

## 2017-12-24 NOTE — Progress Notes (Signed)
Occupational Therapy Session Note  Patient Details  Name: Kelly Gibson MRN: [id number] Date of Birth: Dec 16, 1960  Today's Date: 12/24/2017 OT Individual Time: 6484-7207 OT Individual Time Calculation (min): 75 min    Short Term Goals: Week 3:  OT Short Term Goal 1 (Week 3): STG=LTG due to LOS  Skilled Therapeutic Interventions/Progress Updates:    Pt seen for OT ADL bathing/dressing session. Pt sitting up in w/c upon arrival with husband and RN present. RN reports pt with increased lethergy last night and this morning, suspecting due to change in medication.  Pt agreeable to tx session though declined showering as planned 2/2 fatigue. Pt agreeable to washing from w/c level at sink. Required total A with VCs for attention to use R UE to wash L UE with hand over hand assist. Assist to obtain and maintain figure four sitting position while pt washed R LE. She independently recalled and utilized hemi dressing technique for UB/LB, requiring assist to thread pants onto R LE. She stood at sink with mod A, requiring mod-max A for standing balance with increased R knee instability, knee required to be blocked during standing. Pt voiced increased dizziness and general "not feeling well" when standing. Pt taken to therapy day room to eat breakfast, R UE placed on table top for awareness and weightbearing and use with max A at stabilzer level. Pt returned to room at end of session, left seated in w/c with all needs in reach and husband present.   Therapy Documentation Precautions:  Precautions Precautions: Fall Precaution Comments: bradycardia Restrictions Weight Bearing Restrictions: No Pain:   No/denies pain ADL: ADL ADL Comments: refer to functional navigator  See Function Navigator for Current Functional Status.   Therapy/Group: Individual Therapy  Trisa Cranor L 12/24/2017, 6:31 AM

## 2017-12-24 NOTE — Progress Notes (Signed)
Physical Therapy Session Note  Patient Details  Name: Kelly Gibson MRN: [id number] Date of Birth: June 23, 1961  Today's Date: 12/24/2017 PT Individual Time: 0937-1050 PT Individual Time Calculation (min): 73 min   Short Term Goals: Week 3:  PT Short Term Goal 1 (Week 3): STG = LTG due to estimated d/c date.  Skilled Therapeutic Interventions/Progress Updates:  Pt received in w/c and agreeable to tx, no c/o pain reported. Pt propelled w/c throughout unit (room>ortho gym>apartment>room) with L hemi technique, supervision, and occasional cuing for negotiating doorways and avoiding wall on R as pt with decreased ability to propel in center of hallway and decreased attention to R. Pt does not require as many rest breaks on this date which is a great improvement; pt reports this is because her session is earlier in the day. Pt completed w/c<>car, couch, and bed (in apartment) transfers via squat and stand pivot respectively. Pt requires mod assist for transfers, as well as moderate cuing for head/hips relationship with improving ability for pt to problem solve this. Pt also demonstrates improved ability to set up w/c prior to transfers but still requires assistance with positioning and w/c parts management. Pt with improving ability to coordinate and sequence transfers. Once sitting on EOB pt requires assistance to scoot back on EOB to prevent sliding forward. Therapist provides cuing to utilize LLE to assist RLE on/off bed but ultimately requires assistance for RLE for sit>supine. Pt able to roll R with supervision but requires max cuing to utilize LUE to assist RUE and min assist to roll to L sidelying. Pt transfers supine>sit with steady assist. Pt with increasing RLE tone once sitting EOB and therapist and pt with great difficulty flexing knee to position LE underneath her; educated pt on tone 2/2 CVA. At end of session pt left sitting in w/c in room with quick release belt & chair alarm donned, call  bell within reach.  Therapy Documentation Precautions:  Precautions Precautions: Fall Precaution Comments: bradycardia Restrictions Weight Bearing Restrictions: No RUE Weight Bearing: Weight bearing as tolerated RLE Weight Bearing: Weight bearing as tolerated    See Function Navigator for Current Functional Status.   Therapy/Group: Individual Therapy  Waunita Schooner 12/24/2017, 12:26 PM

## 2017-12-24 NOTE — Progress Notes (Signed)
Subjective/Complaints:   still has tightness in Right arm and shoulder And  RLE hamstrings  Lethargy noted about 1 hour post zanaflex dose  (45m)  ROS: Patient denies chest pain, shortness of breath, nausea vomiting diarrhea constipation  Objective: Vital Signs: Blood pressure (!) 148/79, pulse 83, temperature 98 F (36.7 C), resp. rate 16, height 5' 2"  (1.575 m), weight 67.4 kg (148 lb 9.4 oz), last menstrual period 12/10/2012, SpO2 97 %. No results found. Results for orders placed or performed during the hospital encounter of 12/09/17 (from the past 72 hour(s))  Glucose, capillary     Status: Abnormal   Collection Time: 12/21/17 12:01 PM  Result Value Ref Range   Glucose-Capillary 165 (H) 65 - 99 mg/dL  Glucose, capillary     Status: Abnormal   Collection Time: 12/21/17  4:42 PM  Result Value Ref Range   Glucose-Capillary 128 (H) 65 - 99 mg/dL  Glucose, capillary     Status: Abnormal   Collection Time: 12/21/17  9:26 PM  Result Value Ref Range   Glucose-Capillary 122 (H) 65 - 99 mg/dL  Glucose, capillary     Status: None   Collection Time: 12/22/17  6:32 AM  Result Value Ref Range   Glucose-Capillary 95 65 - 99 mg/dL  Glucose, capillary     Status: Abnormal   Collection Time: 12/22/17 11:50 AM  Result Value Ref Range   Glucose-Capillary 238 (H) 65 - 99 mg/dL  Glucose, capillary     Status: Abnormal   Collection Time: 12/22/17  4:51 PM  Result Value Ref Range   Glucose-Capillary 143 (H) 65 - 99 mg/dL  Glucose, capillary     Status: Abnormal   Collection Time: 12/22/17  8:59 PM  Result Value Ref Range   Glucose-Capillary 150 (H) 65 - 99 mg/dL  Glucose, capillary     Status: None   Collection Time: 12/23/17  6:52 AM  Result Value Ref Range   Glucose-Capillary 97 65 - 99 mg/dL  Creatinine, serum     Status: Abnormal   Collection Time: 12/23/17  7:04 AM  Result Value Ref Range   Creatinine, Ser 1.31 (H) 0.44 - 1.00 mg/dL   GFR calc non Af Amer 45 (L) >60 mL/min   GFR  calc Af Amer 52 (L) >60 mL/min    Comment: (NOTE) The eGFR has been calculated using the CKD EPI equation. This calculation has not been validated in all clinical situations. eGFR's persistently <60 mL/min signify possible Chronic Kidney Disease. [facility]formed at [facility] [id number] [id number]  Glucose, capillary     Status: Abnormal   Collection Time: 12/23/17 11:28 AM  Result Value Ref Range   Glucose-Capillary 179 (H) 65 - 99 mg/dL  Glucose, capillary     Status: Abnormal   Collection Time: 12/23/17  4:36 PM  Result Value Ref Range   Glucose-Capillary 131 (H) 65 - 99 mg/dL  Glucose, capillary     Status: Abnormal   Collection Time: 12/23/17  9:41 PM  Result Value Ref Range   Glucose-Capillary 133 (H) 65 - 99 mg/dL  Glucose, capillary     Status: Abnormal   Collection Time: 12/24/17  6:58 AM  Result Value Ref Range   Glucose-Capillary 200 (H) 65 - 99 mg/dL     Constitutional: No distress . Vital signs reviewed. HEENT: EOMI, oral membranes moist Neck: supple Cardiovascular: RRR without murmur. No JVD    Respiratory: CTA Bilaterally without wheezes or rales. Normal  effort    GI: BS +, non-tender, non-distended  Skin:   Intact Neuro: Alert/Oriented, Normal Sensory, Abnormal Motor 0/5 RUE adn RLE , 5/5 on left side and Dysarthric speech. Good insight and awareness. Tone MAS 3 at the pectoralis MAS 2 at the biceps MAS 2 in Right hamstring, clonus at R ankle Musc/Skel:  Normal Psych: in good spirits   Assessment/Plan: 1. Functional deficits secondary to Right hemiplegia left subcortical infarcts which require 3+ hours per day of interdisciplinary therapy in a comprehensive inpatient rehab setting. Physiatrist is providing close team supervision and 24 hour management of active medical problems listed below. Physiatrist and rehab team continue to assess barriers to discharge/monitor patient progress toward functional and medical  goals. FIM: Function - Bathing Position: Wheelchair/chair at sink Body parts bathed by patient: Chest, Abdomen, Right upper leg, Left upper leg, Right arm, Right lower leg, Left lower leg, Front perineal area Body parts bathed by helper: Left arm, Buttocks, Back Assist Level: Touching or steadying assistance(Pt > 75%)  Function- Upper Body Dressing/Undressing What is the patient wearing?: Pull over shirt/dress Pull over shirt/dress - Perfomed by patient: Thread/unthread left sleeve, Thread/unthread right sleeve, Pull shirt over trunk, Put head through opening Pull over shirt/dress - Perfomed by helper: Put head through opening Assist Level: Touching or steadying assistance(Pt > 75%) Function - Lower Body Dressing/Undressing What is the patient wearing?: Pants, Shoes, [employer], Underwear Position: Education officer, museum at [employer] - Performed by patient: Thread/unthread right underwear leg, Thread/unthread left underwear leg, Pull underwear up/down Pants- Performed by patient: Thread/unthread right pants leg, Thread/unthread left pants leg, Pull pants up/down Pants- Performed by helper: Pull pants up/down, Thread/unthread right pants leg Shoes - Performed by patient: Don/doff right shoe, Don/doff left shoe Shoes - Performed by helper: Fasten right, Fasten left TED Hose - Performed by helper: Don/doff right TED hose, Don/doff left TED hose Assist for footwear: Maximal assist Assist for lower body dressing: Touching or steadying assistance (Pt > 75%)  Function - Toileting Toileting activity did not occur: No continent bowel/bladder event Toileting steps completed by patient: Performs perineal hygiene Toileting steps completed by helper: Adjust clothing prior to toileting, Performs perineal hygiene, Adjust clothing after toileting Toileting Assistive Devices: Grab bar or rail Assist level: Two helpers  Function - Air cabin crew transfer assistive device: Radio broadcast assistant  lift: [place] level to toilet: Maximal assist (Pt 25 - 49%/lift and lower) Assist level from toilet: Maximal assist (Pt 25 - 49%/lift and lower) Assist level to bedside commode (at bedside): Maximal assist (Pt 25 - 49%/lift and lower) Assist level from bedside commode (at bedside): Maximal assist (Pt 25 - 49%/lift and lower)  Function - Chair/bed transfer Chair/bed transfer method: Stand pivot Chair/bed transfer assist level: Maximal assist (Pt 25 - 49%/lift and lower) Chair/bed transfer assistive device: Armrests Chair/bed transfer details: Tactile cues for sequencing, Tactile cues for weight shifting, Tactile cues for posture, Tactile cues for placement, Verbal cues for technique, Verbal cues for sequencing, Manual facilitation for placement, Manual facilitation for weight shifting  Function - Locomotion: Wheelchair Will patient use wheelchair at discharge?: Yes Type: Manual Max wheelchair distance: 50 Assist Level: Touching or steadying assistance (Pt > 75%) Assist Level: Touching or steadying assistance (Pt > 75%) Wheel 150 feet activity did not occur: Safety/medical concerns Turns around,maneuvers to table,bed, and toilet,negotiates 3% grade,maneuvers on rugs and over doorsills: No Function - Locomotion: Ambulation Assistive device: Rail in hallway Max distance: 30 ft Assist level: 2 helpers(max assist + w/c  follow for safety) Assist level: 2 helpers Walk 50 feet with 2 turns activity did not occur: Safety/medical concerns Walk 150 feet activity did not occur: Safety/medical concerns Walk 10 feet on uneven surfaces activity did not occur: Safety/medical concerns  Function - Comprehension Comprehension: Auditory Comprehension assist level: Follows basic conversation/direction with extra time/assistive device  Function - Expression Expression: Verbal Expression assist level: Expresses basic 90% of the time/requires cueing < 10% of the time.  Function - Social  Interaction Social Interaction assist level: Interacts appropriately 75 - 89% of the time - Needs redirection for appropriate language or to initiate interaction.  Function - Problem Solving Problem solving assist level: Solves basic 75 - 89% of the time/requires cueing 10 - 24% of the time  Function - Memory Memory assist level: Recognizes or recalls 75 - 89% of the time/requires cueing 10 - 24% of the time Patient normally able to recall (first 3 days only): Current season, That he or she is in a hospital, Staff names and faces  Medical Problem List and Plan: 1.Right hemiplegia with dysarthria and dysphagiasecondary to left periventricular white matter left corona radiata infarctions as well as history of left brain infarction March 2019  -continue therapies, , 2. DVT Prophylaxis/Anticoagulation: Subcutaneous Lovenox. Monitor for any bleeding episodes 3. Pain Management:Tylenol as needed 4. Mood:Provide emotional support 5. Neuropsych: This patientiscapable of making decisions on herown behalf. 6. Skin/Wound Care:Routine skin checks 7. Fluids/Electrolytes/Nutrition: encourage PO  -BUN was trending up, encourage p.o. will not start IV fluids at this time, fluid intake 12/16/2017 was 300 mL.  Recheck BMET  8.Dysphagia. Dysphagia #2 thin liquids. Follow-up speech therapy 9.Hypertension. Norvasc 5 mg daily, lisinopril 40 mg daily Vitals:   12/23/17 2049 12/24/17 0534  BP: (!) 157/85 (!) 148/79  Pulse: 68 83  Resp: 18 16  Temp:  98 F (36.7 C)  SpO2: 99% 97%  fair control 5/31 10.Diabetes mellitus with peripheral neuropathy. Hemoglobin A1c 8.4. Sliding scale insulin. Check blood sugars before meals and at bedtime. Patient on DiaBeta 2.5 mg daily, insulin glargine 10 units nightly. Resume as needed CBG (last 3)  Recent Labs    12/23/17 1636 12/23/17 2141 12/24/17 0658  GLUCAP 131* 133* 200*  fair in hospital control 5/31, cont glyburide 2.48mg  qam 11.Hyperlipidemia. Crestor 12.COPD with tobacco abuse. NicoDerm patch. Provide counseling. Inhalers as directed. 13.5 mm right upper lobe nodule with nonspecific mildly enlarged mediastinal bilateral hilar lymph nodes per CT of the chest 12/08/2017. Recommendations are for noncontrast chest CT 12 months for evaluation 14.  HypoK mild likely nutritional started KCL 117m 5/23, still low increase to 2028m5/27, cont this dose for now  15.  Spasticity will start low-dose tizanidine continue PT and OT- no evidence of sedation, decrease to 2mg12mD, will increase baclofen , 16.  ARF- D/C ACE and Monitor, enc fluids, recheck BMET Stable, cont enc fluids 17.  Constipation improved, cont.  senna S to 2 po BID,  LOS (Days) 15 A FACE TO FACE EVALUATION WAS PERFORMED  AndrCharlett Blake1/2019, 8:23 AM

## 2017-12-24 NOTE — Plan of Care (Signed)
  Problem: Consults Goal: RH STROKE PATIENT EDUCATION Description See Patient Education module for education specifics  Outcome: Progressing   Problem: RH BLADDER ELIMINATION Goal: RH STG MANAGE BLADDER WITH ASSISTANCE Description STG Manage Bladder With Min Assistance  Outcome: Progressing   Problem: RH SAFETY Goal: RH STG ADHERE TO SAFETY PRECAUTIONS W/ASSISTANCE/DEVICE Description STG Adhere to Safety Precautions With Assistance/Device. With cues and reminders.  Outcome: Progressing Goal: RH STG DECREASED RISK OF FALL WITH ASSISTANCE Description STG Decreased Risk of Fall With Assistance. Cues and reminders.  Outcome: Progressing   Problem: RH KNOWLEDGE DEFICIT Goal: RH STG INCREASE KNOWLEDGE OF DIABETES Description Pt.'s husband will be able to demonstrate knowledge of how to use medications, CBG monitoring, diet with cues and reminders.  Outcome: Progressing Goal: RH STG INCREASE KNOWLEDGE OF HYPERTENSION Description Husband will be able to demonstrate knowledge of medications and diet with cues and reminders.  Outcome: Progressing

## 2017-12-25 ENCOUNTER — Inpatient Hospital Stay (HOSPITAL_COMMUNITY): Payer: BLUE CROSS/BLUE SHIELD

## 2017-12-25 ENCOUNTER — Inpatient Hospital Stay (HOSPITAL_COMMUNITY): Payer: BLUE CROSS/BLUE SHIELD | Admitting: Occupational Therapy

## 2017-12-25 DIAGNOSIS — E876 Hypokalemia: Secondary | ICD-10-CM

## 2017-12-25 DIAGNOSIS — N179 Acute kidney failure, unspecified: Secondary | ICD-10-CM

## 2017-12-25 DIAGNOSIS — R7309 Other abnormal glucose: Secondary | ICD-10-CM

## 2017-12-25 DIAGNOSIS — I69391 Dysphagia following cerebral infarction: Secondary | ICD-10-CM

## 2017-12-25 LAB — GLUCOSE, CAPILLARY
GLUCOSE-CAPILLARY: 172 mg/dL — AB (ref 65–99)
Glucose-Capillary: 84 mg/dL (ref 65–99)
Glucose-Capillary: 86 mg/dL (ref 65–99)
Glucose-Capillary: 205 mg/dL — ABNORMAL HIGH (ref 65–99)

## 2017-12-25 MED ORDER — AMLODIPINE BESYLATE 5 MG PO TABS
7.5000 mg | ORAL_TABLET | Freq: Every day | ORAL | Status: DC
  Administered 2017-12-26 – 2017-12-27 (×2): 7.5 mg via ORAL
  Filled 2017-12-25 (×3): qty 1

## 2017-12-25 MED ORDER — AMLODIPINE BESYLATE 2.5 MG PO TABS
2.5000 mg | ORAL_TABLET | Freq: Once | ORAL | Status: AC
  Administered 2017-12-25: 2.5 mg via ORAL
  Filled 2017-12-25: qty 1

## 2017-12-25 NOTE — Progress Notes (Signed)
Subjective/Complaints: Patient seen lying in bed this morning.  She states she slept well overnight.  ROS: Denies chest pain, shortness of breath, nausea vomiting diarrhea   Objective: Vital Signs: Blood pressure (!) 165/92, pulse 75, temperature 98.5 F (36.9 C), temperature source Oral, resp. rate 18, height _0  (1.575 m), weight 67.4 kg (148 lb 9.4 oz), last menstrual period 12/10/2012, SpO2 94 %. No results found. Results for orders placed or performed during the hospital encounter of 12/09/17 (from the past 72 hour(s))  Glucose, capillary     Status: Abnormal   Collection Time: 12/22/17  8:59 PM  Result Value Ref Range   Glucose-Capillary 150 (H) 65 - 99 mg/dL  Glucose, capillary     Status: None   Collection Time: 12/23/17  6:52 AM  Result Value Ref Range   Glucose-Capillary 97 65 - 99 mg/dL  Creatinine, serum     Status: Abnormal   Collection Time: 12/23/17  7:04 AM  Result Value Ref Range   Creatinine, Ser 1.31 (H) 0.44 - 1.00 mg/dL   GFR calc non Af Amer 45 (L) >60 mL/min   GFR calc Af Amer 52 (L) >60 mL/min    Comment: (NOTE) The eGFR has been calculated using the CKD EPI equation. This calculation has not been validated in all clinical situations. eGFR's persistently <60 mL/min signify possible Chronic Kidney Disease. Performed at [facility], [place] [address] [place], [place] [address]   Glucose, capillary     Status: Abnormal   Collection Time: 12/23/17 11:28 AM  Result Value Ref Range   Glucose-Capillary 179 (H) 65 - 99 mg/dL  Glucose, capillary     Status: Abnormal   Collection Time: 12/23/17  4:36 PM  Result Value Ref Range   Glucose-Capillary 131 (H) 65 - 99 mg/dL  Glucose, capillary     Status: Abnormal   Collection Time: 12/23/17  9:41 PM  Result Value Ref Range   Glucose-Capillary 133 (H) 65 - 99 mg/dL  Glucose, capillary     Status: Abnormal   Collection Time: 12/24/17  6:58 AM  Result Value Ref Range   Glucose-Capillary 200 (H) 65 - 99  mg/dL  Glucose, capillary     Status: Abnormal   Collection Time: 12/24/17 11:39 AM  Result Value Ref Range   Glucose-Capillary 114 (H) 65 - 99 mg/dL  Glucose, capillary     Status: Abnormal   Collection Time: 12/24/17  4:39 PM  Result Value Ref Range   Glucose-Capillary 219 (H) 65 - 99 mg/dL  Glucose, capillary     Status: Abnormal   Collection Time: 12/24/17  9:29 PM  Result Value Ref Range   Glucose-Capillary 160 (H) 65 - 99 mg/dL  Glucose, capillary     Status: None   Collection Time: 12/25/17  6:37 AM  Result Value Ref Range   Glucose-Capillary 84 65 - 99 mg/dL  Glucose, capillary     Status: None   Collection Time: 12/25/17 12:05 PM  Result Value Ref Range   Glucose-Capillary 86 65 - 99 mg/dL  Glucose, capillary     Status: Abnormal   Collection Time: 12/25/17  4:30 PM  Result Value Ref Range   Glucose-Capillary 205 (H) 65 - 99 mg/dL     Constitutional: No distress . Vital signs reviewed. HENT: Normocephalic atraumatic Eyes: EOMI.  No discharge. Cardiovascular: RRR without JVD. Respiratory: CTA Bilaterally. Clear.    GI: BS +,non-distended  Skin:   Intact.  Warm and dry. Neuro: Alert and  oriented Motor: RUE/RLE: 0/5 proximal to distal Dysarthria Increased tone RUE/RLE Musc/Skel: No edema.  No tenderness. Psych: in good spirits   Assessment/Plan: 1. Functional deficits secondary to Right hemiplegia left subcortical infarcts which require 3+ hours per day of interdisciplinary therapy in a comprehensive inpatient rehab setting. Physiatrist is providing close team supervision and 24 hour management of active medical problems listed below. Physiatrist and rehab team continue to assess barriers to discharge/monitor patient progress toward functional and medical goals. FIM: Function - Bathing Position: Wheelchair/chair at sink Body parts bathed by patient: Chest, Abdomen, Right upper leg, Left upper leg, Right arm, Right lower leg, Left lower leg, Front perineal  area Body parts bathed by helper: Left arm, Buttocks, Back Assist Level: Touching or steadying assistance(Pt > 75%)  Function- Upper Body Dressing/Undressing What is the patient wearing?: Pull over shirt/dress Pull over shirt/dress - Perfomed by patient: Thread/unthread left sleeve, Thread/unthread right sleeve, Pull shirt over trunk, Put head through opening Pull over shirt/dress - Perfomed by helper: Put head through opening Assist Level: Touching or steadying assistance(Pt > 75%) Function - Lower Body Dressing/Undressing What is the patient wearing?: Pants, Shoes, Ted Hose Position: Wheelchair/chair at [employer] - Performed by patient: Thread/unthread right underwear leg, Thread/unthread left underwear leg, Pull underwear up/down Pants- Performed by patient: Thread/unthread right pants leg Pants- Performed by helper: Thread/unthread left pants leg, Pull pants up/down Shoes - Performed by patient: Don/doff left shoe Shoes - Performed by helper: Fasten right, Fasten left, Don/doff right shoe TED Hose - Performed by helper: Don/doff right TED hose, Don/doff left TED hose Assist for footwear: Maximal assist Assist for lower body dressing: Touching or steadying assistance (Pt > 75%)  Function - Toileting Toileting activity did not occur: No continent bowel/bladder event Toileting steps completed by patient: Performs perineal hygiene Toileting steps completed by helper: Adjust clothing prior to toileting, Performs perineal hygiene, Adjust clothing after toileting Toileting Assistive Devices: Grab bar or rail Assist level: Two helpers  Function - Air cabin crew transfer assistive device: Radio broadcast assistant lift: Stedy Assist level to toilet: Maximal assist (Pt 25 - 49%/lift and lower) Assist level from toilet: Maximal assist (Pt 25 - 49%/lift and lower) Assist level to bedside commode (at bedside): Maximal assist (Pt 25 - 49%/lift and lower) Assist level from bedside  commode (at bedside): Maximal assist (Pt 25 - 49%/lift and lower)  Function - Chair/bed transfer Chair/bed transfer method: Stand pivot Chair/bed transfer assist level: Moderate assist (Pt 50 - 74%/lift or lower) Chair/bed transfer assistive device: Armrests Chair/bed transfer details: Verbal cues for sequencing, Verbal cues for technique, Tactile cues for weight shifting, Tactile cues for placement  Function - Locomotion: Wheelchair Will patient use wheelchair at discharge?: Yes Type: Manual Max wheelchair distance: >150 ft   Assist Level: Supervision or verbal cues Assist Level: Supervision or verbal cues Wheel 150 feet activity did not occur: Safety/medical concerns Assist Level: Supervision or verbal cues Turns around,maneuvers to table,bed, and toilet,negotiates 3% grade,maneuvers on rugs and over doorsills: No Function - Locomotion: Ambulation Assistive device: Rail in hallway Max distance: 30 ft Assist level: 2 helpers(max assist + w/c follow for safety) Assist level: 2 helpers Walk 50 feet with 2 turns activity did not occur: Safety/medical concerns Walk 150 feet activity did not occur: Safety/medical concerns Walk 10 feet on uneven surfaces activity did not occur: Safety/medical concerns  Function - Comprehension Comprehension: Auditory Comprehension assist level: Follows basic conversation/direction with extra time/assistive device  Function - Expression Expression: Verbal Expression assist  level: Expresses basic 90% of the time/requires cueing < 10% of the time.  Function - Social Interaction Social Interaction assist level: Interacts appropriately with others - No medications needed.  Function - Problem Solving Problem solving assist level: Solves basic 75 - 89% of the time/requires cueing 10 - 24% of the time  Function - Memory Memory assist level: Recognizes or recalls 75 - 89% of the time/requires cueing 10 - 24% of the time, Recognizes or recalls 90% of the  time/requires cueing < 10% of the time Patient normally able to recall (first 3 days only): Current season, That he or she is in a hospital, Staff names and faces  Medical Problem List and Plan: 1.Right hemiplegia with dysarthria and dysphagiasecondary to left periventricular white matter left corona radiata infarctions as well as history of left brain infarction March 2019  -continue therapies  Notes reviewed, images reviewed, labs reviewed 2. DVT Prophylaxis/Anticoagulation: Subcutaneous Lovenox. Monitor for any bleeding episodes 3. Pain Management:Tylenol as needed 4. Mood:Provide emotional support 5. Neuropsych: This patientiscapable of making decisions on herown behalf. 6. Skin/Wound Care:Routine skin checks 7. Fluids/Electrolytes/Nutrition: encourage PO 8.Dysphagia. Follow-up speech therapy  Advanced to regular consistency diet on 6/1 9.Hypertension.   Norvasc 5 mg daily, increased to 7.5 on 6/1  Lisinopril 40 mg daily Vitals:   12/25/17 0924 12/25/17 1634  BP:  (!) 165/92  Pulse: 73 75  Resp: 18   Temp:  98.5 F (36.9 C)  SpO2: 98% 94%    10.Diabetes mellitus with peripheral neuropathy. Hemoglobin A1c 8.4. Sliding scale insulin. Check blood sugars before meals and at bedtime. Patient on DiaBeta 2.5 mg daily, insulin glargine 10 units nightly. Resume as needed CBG (last 3)  Recent Labs    12/25/17 0637 12/25/17 1205 12/25/17 1630  GLUCAP 84 86 205*   cont glyburide 2.73mg qam  Labile on 6/1 11.Hyperlipidemia. Crestor 12.COPD with tobacco abuse. NicoDerm patch. Provide counseling. Inhalers as directed. 13.5 mm right upper lobe nodule with nonspecific mildly enlarged mediastinal bilateral hilar lymph nodes per CT of the chest 12/08/2017. Recommendations are for noncontrast chest CT 12 months for evaluation 14.  HypoK mild likely nutritional started KCL 15m 5/23, still low increase to 2036m5/27, cont this dose for now    Potassium 3.3  on 5/28  Labs ordered for Monday 15.  Spasticity   Started low-dose tizanidine and baclofen 16.  AKI- D/Ced ACE and Monitor, enc fluids, recheck BMET Stable, cont enc fluids  Creatinine 1.31 on 5/30  Labs ordered for Monday 17.  Constipation improved, cont.  senna S to 2 po BID,   LOS (Days) 16 A FACE TO FACE EVALUATION WAS PERFORMED  Golden Gilreath AniLorie Phenix1/2019, 5:50 PM

## 2017-12-25 NOTE — Progress Notes (Signed)
Occupational Therapy Session Note  Patient Details  Name: Kelly Gibson MRN: [id number] Date of Birth: 10/11/1960  Today's Date: 12/25/2017 OT Group Time: 1100-1200  Skilled Therapeutic Interventions/Progress Updates:    Pt engaged in therapeutic w/c level dance group with focus on Rt NMR, activity tolerance, and social participation. Pt was guided through active assisted exercises for R UE with [facility] for external rotation and forearm extension. Pt hooking L LE under Rt for lifting legs in beat to music. Throughout tx, pt was engaging with others, smiling, and laughing. She particularly liked the [employer] songs. At end of tx pt was escorted back to room and was left with all needs within reach.    Therapy Documentation Precautions:  Precautions Precautions: Fall Precaution Comments: bradycardia Restrictions Weight Bearing Restrictions: No RUE Weight Bearing: Weight bearing as tolerated RLE Weight Bearing: Weight bearing as tolerated Vital Signs: Therapy Vitals Pulse Rate: 73 Resp: 18 Patient Position (if appropriate): Sitting Oxygen Therapy SpO2: 98 % O2 Device: Room Air Pain: Pain Assessment Pain Score: 0-No pain ADL: ADL ADL Comments: refer to functional navigator     See Function Navigator for Current Functional Status.   Therapy/Group: Group Therapy  [name] 12/25/2017, 12:50 PM

## 2017-12-25 NOTE — Progress Notes (Signed)
Speech Language Pathology Daily Session Note  Patient Details  Name: Kelly Gibson MRN: [id number] Date of Birth: 04/16/1961  Today's Date: 12/25/2017 SLP Individual Time: 1200-1256 SLP Individual Time Calculation (min): 56 min  Short Term Goals: Week 3: SLP Short Term Goal 1 (Week 3): Patient will recall new, daily information with use of external aids with Supervision multimodal cues.  SLP Short Term Goal 2 (Week 3): Patient will demonstrate functional problem solving for mildy complex tasks with Min verbal cues.  SLP Short Term Goal 3 (Week 3): Patient will utilize speech intelligibility strategies at the sentence level with supervision verbal cues to achieve 90% of intelligibility.  SLP Short Term Goal 4 (Week 3): Patient will consume trials of regular textures and demonstrate efficient mastication with complete oral clearance without overt s/s of aspiration over 2 sessions prior to upgrade with supervision verbal cues.   Skilled Therapeutic Interventions:Skilled ST services focused on swallow and cognitive skills. SLP facilitated PO consumption of regular textured trial tray with thin liquid via straw, pt demonstrated Mod I use of swallow strategies,min oral residue, clearing right buccal pocketing with lingual sweeps and liquid wash, no overt aspiration noted. SLP upgraded diet to regular textured foods with intermittent supervision A for diet tolerance. SLP facilitated recall of today's activities given min A verbal cues and supervision A verbal cues to utilize memory notebook. Pt was left in room with call bell within reach. Reccomend to continue skilled ST services.      Function:  Eating Eating   Modified Consistency Diet: No Eating Assist Level: Set up assist for   Eating Set Up Assist For: Opening containers       Cognition Comprehension Comprehension assist level: Follows basic conversation/direction with extra time/assistive device  Expression   Expression assist level:  Expresses basic 90% of the time/requires cueing < 10% of the time.  Social Interaction Social Interaction assist level: Interacts appropriately with others - No medications needed.  Problem Solving Problem solving assist level: Solves basic 75 - 89% of the time/requires cueing 10 - 24% of the time  Memory Memory assist level: Recognizes or recalls 75 - 89% of the time/requires cueing 10 - 24% of the time;Recognizes or recalls 90% of the time/requires cueing < 10% of the time    Pain Pain Assessment Pain Score: 0-No pain  Therapy/Group: Individual Therapy  Marrie Chandra  [facility] 12/25/2017, 12:56 PM

## 2017-12-26 ENCOUNTER — Inpatient Hospital Stay (HOSPITAL_COMMUNITY): Payer: BLUE CROSS/BLUE SHIELD

## 2017-12-26 DIAGNOSIS — I169 Hypertensive crisis, unspecified: Secondary | ICD-10-CM

## 2017-12-26 DIAGNOSIS — E1142 Type 2 diabetes mellitus with diabetic polyneuropathy: Secondary | ICD-10-CM

## 2017-12-26 DIAGNOSIS — E1165 Type 2 diabetes mellitus with hyperglycemia: Secondary | ICD-10-CM

## 2017-12-26 LAB — GLUCOSE, CAPILLARY
GLUCOSE-CAPILLARY: 91 mg/dL (ref 65–99)
GLUCOSE-CAPILLARY: 113 mg/dL — AB (ref 65–99)
Glucose-Capillary: 116 mg/dL — ABNORMAL HIGH (ref 65–99)
Glucose-Capillary: 196 mg/dL — ABNORMAL HIGH (ref 65–99)

## 2017-12-26 NOTE — Progress Notes (Signed)
Subjective/Complaints: Patient seen lying in bed this morning. She states she slept well overnight. She denies complaints this morning.  ROS:  Denies chest pain, shortness of breath, nausea vomiting diarrhea   Objective: Vital Signs: Blood pressure (!) 155/97, pulse 89, temperature 98.3 F (36.8 C), temperature source Oral, resp. rate 19, height 5\' 2"  (1.575 m), weight 67.4 kg (148 lb 9.4 oz), last menstrual period 12/10/2012, SpO2 99 %. No results found. Results for orders placed or performed during the hospital encounter of 12/09/17 (from the past 72 hour(s))  Glucose, capillary     Status: Abnormal   Collection Time: 12/23/17 11:28 AM  Result Value Ref Range   Glucose-Capillary 179 (H) 65 - 99 mg/dL  Glucose, capillary     Status: Abnormal   Collection Time: 12/23/17  4:36 PM  Result Value Ref Range   Glucose-Capillary 131 (H) 65 - 99 mg/dL  Glucose, capillary     Status: Abnormal   Collection Time: 12/23/17  9:41 PM  Result Value Ref Range   Glucose-Capillary 133 (H) 65 - 99 mg/dL  Glucose, capillary     Status: Abnormal   Collection Time: 12/24/17  6:58 AM  Result Value Ref Range   Glucose-Capillary 200 (H) 65 - 99 mg/dL  Glucose, capillary     Status: Abnormal   Collection Time: 12/24/17 11:39 AM  Result Value Ref Range   Glucose-Capillary 114 (H) 65 - 99 mg/dL  Glucose, capillary     Status: Abnormal   Collection Time: 12/24/17  4:39 PM  Result Value Ref Range   Glucose-Capillary 219 (H) 65 - 99 mg/dL  Glucose, capillary     Status: Abnormal   Collection Time: 12/24/17  9:29 PM  Result Value Ref Range   Glucose-Capillary 160 (H) 65 - 99 mg/dL  Glucose, capillary     Status: None   Collection Time: 12/25/17  6:37 AM  Result Value Ref Range   Glucose-Capillary 84 65 - 99 mg/dL  Glucose, capillary     Status: None   Collection Time: 12/25/17 12:05 PM  Result Value Ref Range   Glucose-Capillary 86 65 - 99 mg/dL  Glucose, capillary     Status: Abnormal   Collection  Time: 12/25/17  4:30 PM  Result Value Ref Range   Glucose-Capillary 205 (H) 65 - 99 mg/dL  Glucose, capillary     Status: Abnormal   Collection Time: 12/25/17  9:27 PM  Result Value Ref Range   Glucose-Capillary 172 (H) 65 - 99 mg/dL  Glucose, capillary     Status: Abnormal   Collection Time: 12/26/17  6:37 AM  Result Value Ref Range   Glucose-Capillary 116 (H) 65 - 99 mg/dL     Constitutional: No distress . Vital signs reviewed. HENT: Normocephalic atraumatic Eyes: EOMI.  No discharge. Cardiovascular: RRR without JVD. Respiratory: CTA Bilaterally. clear.    GI: BS +,non-distended  Skin:   Intact.  Warm and dry. Neuro: Alert and oriented Motor: RUE/RLE: 0/5 proximal to distal Dysarthria No increase in tone noted in right upper extremity Musc/Skel: No edema.  No tenderness. Psych: in good spirits   Assessment/Plan: 1. Functional deficits secondary to Right hemiplegia left subcortical infarcts which require 3+ hours per day of interdisciplinary therapy in a comprehensive inpatient rehab setting. Physiatrist is providing close team supervision and 24 hour management of active medical problems listed below. Physiatrist and rehab team continue to assess barriers to discharge/monitor patient progress toward functional and medical goals. FIM: Function - Bathing Position: Wheelchair/chair at  sink Body parts bathed by patient: Chest, Abdomen, Right upper leg, Left upper leg, Right arm, Right lower leg, Left lower leg, Front perineal area Body parts bathed by helper: Left arm, Buttocks, Back Assist Level: Touching or steadying assistance(Pt > 75%)  Function- Upper Body Dressing/Undressing What is the patient wearing?: Pull over shirt/dress Pull over shirt/dress - Perfomed by patient: Thread/unthread left sleeve, Thread/unthread right sleeve, Pull shirt over trunk, Put head through opening Pull over shirt/dress - Perfomed by helper: Put head through opening Assist Level: Touching or  steadying assistance(Pt > 75%) Function - Lower Body Dressing/Undressing What is the patient wearing?: Pants, Shoes, Ted Hose Position: Wheelchair/chair at [employer] - Performed by patient: Thread/unthread right underwear leg, Thread/unthread left underwear leg, Pull underwear up/down Pants- Performed by patient: Thread/unthread right pants leg Pants- Performed by helper: Thread/unthread left pants leg, Pull pants up/down Shoes - Performed by patient: Don/doff left shoe Shoes - Performed by helper: Fasten right, Fasten left, Don/doff right shoe TED Hose - Performed by helper: Don/doff right TED hose, Don/doff left TED hose Assist for footwear: Maximal assist Assist for lower body dressing: Touching or steadying assistance (Pt > 75%)  Function - Toileting Toileting activity did not occur: No continent bowel/bladder event Toileting steps completed by patient: Performs perineal hygiene Toileting steps completed by helper: Adjust clothing prior to toileting, Performs perineal hygiene, Adjust clothing after toileting Toileting Assistive Devices: Grab bar or rail Assist level: Two helpers  Function - Air cabin crew transfer assistive device: Radio broadcast assistant lift: Stedy Assist level to toilet: Maximal assist (Pt 25 - 49%/lift and lower) Assist level from toilet: Maximal assist (Pt 25 - 49%/lift and lower) Assist level to bedside commode (at bedside): Maximal assist (Pt 25 - 49%/lift and lower) Assist level from bedside commode (at bedside): Maximal assist (Pt 25 - 49%/lift and lower)  Function - Chair/bed transfer Chair/bed transfer method: Stand pivot Chair/bed transfer assist level: Moderate assist (Pt 50 - 74%/lift or lower) Chair/bed transfer assistive device: Armrests Chair/bed transfer details: Verbal cues for sequencing, Verbal cues for technique, Tactile cues for weight shifting, Tactile cues for placement  Function - Locomotion: Wheelchair Will patient use  wheelchair at discharge?: Yes Type: Manual Max wheelchair distance: >150 ft   Assist Level: Supervision or verbal cues Assist Level: Supervision or verbal cues Wheel 150 feet activity did not occur: Safety/medical concerns Assist Level: Supervision or verbal cues Turns around,maneuvers to table,bed, and toilet,negotiates 3% grade,maneuvers on rugs and over doorsills: No Function - Locomotion: Ambulation Assistive device: Rail in hallway Max distance: 30 ft Assist level: 2 helpers(max assist + w/c follow for safety) Assist level: 2 helpers Walk 50 feet with 2 turns activity did not occur: Safety/medical concerns Walk 150 feet activity did not occur: Safety/medical concerns Walk 10 feet on uneven surfaces activity did not occur: Safety/medical concerns  Function - Comprehension Comprehension: Auditory Comprehension assist level: Follows basic conversation/direction with extra time/assistive device  Function - Expression Expression: Verbal Expression assist level: Expresses basic 90% of the time/requires cueing < 10% of the time.  Function - Social Interaction Social Interaction assist level: Interacts appropriately with others - No medications needed.  Function - Problem Solving Problem solving assist level: Solves basic 75 - 89% of the time/requires cueing 10 - 24% of the time  Function - Memory Memory assist level: Recognizes or recalls 75 - 89% of the time/requires cueing 10 - 24% of the time Patient normally able to recall (first 3 days only): Current season, That  he or she is in a hospital, Staff names and faces  Medical Problem List and Plan: 1.Right hemiplegia with dysarthria and dysphagiasecondary to left periventricular white matter left corona radiata infarctions as well as history of left brain infarction March 2019  Continue CIR 2. DVT Prophylaxis/Anticoagulation: Subcutaneous Lovenox. Monitor for any bleeding episodes 3. Pain Management:Tylenol as needed 4.  Mood:Provide emotional support 5. Neuropsych: This patientiscapable of making decisions on herown behalf. 6. Skin/Wound Care:Routine skin checks 7. Fluids/Electrolytes/Nutrition: encourage PO 8.Dysphagia. Follow-up speech therapy  Advanced to regular consistency diet on 6/1 9.Hypertension.   Norvasc 5 mg daily, increased to 7.5 on 6/1  Lisinopril 40 mg daily Vitals:   12/26/17 0534 12/26/17 0702  BP: (!) 187/94 (!) 155/97  Pulse: 85 89  Resp: 19   Temp: 98.3 F (36.8 C)   SpO2: 99%    Hypertensive crisis overnight, consider further medication adjustment tomorrow if necessary 10.Diabetes mellitus with peripheral neuropathy. Hemoglobin A1c 8.4. Sliding scale insulin. Check blood sugars before meals and at bedtime. Patient on DiaBeta 2.5 mg daily, insulin glargine 10 units nightly. Resume as needed CBG (last 3)  Recent Labs    12/25/17 1630 12/25/17 2127 12/26/17 0637  GLUCAP 205* 172* 116*   cont glyburide 2.63m g qam  Labile on 6/2 11.Hyperlipidemia. Crestor 12.COPD with tobacco abuse. NicoDerm patch. Provide counseling. Inhalers as directed. 13.5 mm right upper lobe nodule with nonspecific mildly enlarged mediastinal bilateral hilar lymph nodes per CT of the chest 12/08/2017. Recommendations are for noncontrast chest CT 12 months for evaluation 14.  HypoK mild likely nutritional started KCL 60meq 5/23, still low increase to 41meq 5/27, cont this dose for now    Potassium 3.3 on 5/28  Labs ordered for tomorrow 15.  Spasticity   Started low-dose tizanidine and baclofen 16.  AKI- D/Ced ACE and Monitor, enc fluids, recheck BMET Stable, cont enc fluids  Creatinine 1.31 on 5/30  Labs ordered for tomorrow 17.  Constipation improved, cont.  senna S to 2 po BID,   LOS (Days) 17 A FACE TO FACE EVALUATION WAS PERFORMED  Raymond Bhardwaj Lorie Phenix 12/26/2017, 7:46 AM

## 2017-12-26 NOTE — Progress Notes (Signed)
Occupational Therapy Session Note  Patient Details  Name: Kelly Gibson MRN: [id number] Date of Birth: 12/10/1960  Today's Date: 12/26/2017 OT Individual Time: 3546-5681 OT Individual Time Calculation (min): 60 min    Skilled Therapeutic Interventions/Progress Updates:     1:1. Pt and husband present for therapy session willing to work on [facility] transfers to [facility] for hands on training. OT demo squat pivot transfer to [facility] while educating on body mechanics, knee blocking, facilitation at hips, dropping arm on DAC, as well as hand placement for transfers. Husband able to return demonstrate with cueing for lifting from legs not back as well as where to place body so pt able to place hand on armrest for transfer. Pt and husband went to ADL apartment and pt completes tub bench transfer with MOD A for squat pivot w/c<>TTB as well as RLE management into tub. Educated pt and husband on use of hand held shower head, non slip strips for bottom of tub and shower curtain adaptations so water doesn't spill onto floor. OT installs shoe buttons on shoes so pt able to fasten with one hand as well as colored coban strip so pt knows where to pull across to hook lace onto shoe button. Exited session with pt seated in w/c, call light in reach, chair alarm on and husband in room.  Therapy Documentation Precautions:  Precautions Precautions: Fall Precaution Comments: bradycardia Restrictions Weight Bearing Restrictions: No RUE Weight Bearing: Weight bearing as tolerated RLE Weight Bearing: Weight bearing as tolerated  See Function Navigator for Current Functional Status.   Therapy/Group: Individual Therapy  [name] 12/26/2017, 5:18 PM

## 2017-12-27 ENCOUNTER — Inpatient Hospital Stay (HOSPITAL_COMMUNITY): Payer: BLUE CROSS/BLUE SHIELD | Admitting: Speech Pathology

## 2017-12-27 ENCOUNTER — Inpatient Hospital Stay (HOSPITAL_COMMUNITY): Payer: BLUE CROSS/BLUE SHIELD | Admitting: Physical Therapy

## 2017-12-27 ENCOUNTER — Inpatient Hospital Stay (HOSPITAL_COMMUNITY): Payer: BLUE CROSS/BLUE SHIELD | Admitting: Occupational Therapy

## 2017-12-27 LAB — CBC WITH DIFFERENTIAL/PLATELET
ABS IMMATURE GRANULOCYTES: 0 10*3/uL (ref 0.0–0.1)
Basophils Absolute: 0.1 10*3/uL (ref 0.0–0.1)
Basophils Relative: 1 %
Eosinophils Absolute: 0.3 10*3/uL (ref 0.0–0.7)
Eosinophils Relative: 4 %
HEMATOCRIT: 35.8 % — AB (ref 36.0–46.0)
HEMOGLOBIN: 12 g/dL (ref 12.0–15.0)
IMMATURE GRANULOCYTES: 0 %
LYMPHS ABS: 1.9 10*3/uL (ref 0.7–4.0)
LYMPHS PCT: 26 %
MCH: 27.3 pg (ref 26.0–34.0)
MCHC: 33.5 g/dL (ref 30.0–36.0)
MCV: 81.4 fL (ref 78.0–100.0)
MONOS PCT: 9 %
Monocytes Absolute: 0.7 10*3/uL (ref 0.1–1.0)
NEUTROS PCT: 60 %
Neutro Abs: 4.6 10*3/uL (ref 1.7–7.7)
PLATELETS: 531 10*3/uL — AB (ref 150–400)
RBC: 4.4 MIL/uL (ref 3.87–5.11)
RDW: 12.9 % (ref 11.5–15.5)
WBC: 7.5 10*3/uL (ref 4.0–10.5)

## 2017-12-27 LAB — GLUCOSE, CAPILLARY
GLUCOSE-CAPILLARY: 114 mg/dL — AB (ref 65–99)
GLUCOSE-CAPILLARY: 207 mg/dL — AB (ref 65–99)
Glucose-Capillary: 94 mg/dL (ref 65–99)
Glucose-Capillary: 105 mg/dL — ABNORMAL HIGH (ref 65–99)

## 2017-12-27 LAB — BASIC METABOLIC PANEL
Anion gap: 10 (ref 5–15)
BUN: 19 mg/dL (ref 6–20)
CHLORIDE: 104 mmol/L (ref 101–111)
CO2: 23 mmol/L (ref 22–32)
Calcium: 9.1 mg/dL (ref 8.9–10.3)
Creatinine, Ser: 0.95 mg/dL (ref 0.44–1.00)
GFR calc non Af Amer: >60 mL/min (ref >60)
GLUCOSE: 239 mg/dL — AB (ref 65–99)
Potassium: 3.5 mmol/L (ref 3.5–5.1)
Sodium: 137 mmol/L (ref 135–145)

## 2017-12-27 MED ORDER — BACLOFEN 10 MG PO TABS
10.0000 mg | ORAL_TABLET | Freq: Three times a day (TID) | ORAL | Status: DC
  Administered 2017-12-27 (×2): 10 mg via ORAL
  Filled 2017-12-27 (×3): qty 1

## 2017-12-27 NOTE — Progress Notes (Addendum)
Subjective/Complaints: Still having spasm in RUE>RLE No pains Practicing transfers from bed to chair with husband and PT  ROS:  Denies chest pain, shortness of breath, nausea vomiting diarrhea   Objective: Vital Signs: Blood pressure (!) 170/99, pulse 78, temperature 98 F (36.7 C), resp. rate 18, height 5\' 2"  (1.575 m), weight 67.4 kg (148 lb 9.4 oz), last menstrual period 12/10/2012, SpO2 97 %. No results found. Results for orders placed or performed during the hospital encounter of 12/09/17 (from the past 72 hour(s))  Glucose, capillary     Status: Abnormal   Collection Time: 12/24/17 11:39 AM  Result Value Ref Range   Glucose-Capillary 114 (H) 65 - 99 mg/dL  Glucose, capillary     Status: Abnormal   Collection Time: 12/24/17  4:39 PM  Result Value Ref Range   Glucose-Capillary 219 (H) 65 - 99 mg/dL  Glucose, capillary     Status: Abnormal   Collection Time: 12/24/17  9:29 PM  Result Value Ref Range   Glucose-Capillary 160 (H) 65 - 99 mg/dL  Glucose, capillary     Status: None   Collection Time: 12/25/17  6:37 AM  Result Value Ref Range   Glucose-Capillary 84 65 - 99 mg/dL  Glucose, capillary     Status: None   Collection Time: 12/25/17 12:05 PM  Result Value Ref Range   Glucose-Capillary 86 65 - 99 mg/dL  Glucose, capillary     Status: Abnormal   Collection Time: 12/25/17  4:30 PM  Result Value Ref Range   Glucose-Capillary 205 (H) 65 - 99 mg/dL  Glucose, capillary     Status: Abnormal   Collection Time: 12/25/17  9:27 PM  Result Value Ref Range   Glucose-Capillary 172 (H) 65 - 99 mg/dL  Glucose, capillary     Status: Abnormal   Collection Time: 12/26/17  6:37 AM  Result Value Ref Range   Glucose-Capillary 116 (H) 65 - 99 mg/dL  Glucose, capillary     Status: None   Collection Time: 12/26/17 12:58 PM  Result Value Ref Range   Glucose-Capillary 91 65 - 99 mg/dL  Glucose, capillary     Status: Abnormal   Collection Time: 12/26/17  4:59 PM  Result Value Ref Range    Glucose-Capillary 113 (H) 65 - 99 mg/dL   Comment 1 Notify RN   Glucose, capillary     Status: Abnormal   Collection Time: 12/26/17  8:54 PM  Result Value Ref Range   Glucose-Capillary 196 (H) 65 - 99 mg/dL  Glucose, capillary     Status: None   Collection Time: 12/27/17  6:40 AM  Result Value Ref Range   Glucose-Capillary 94 65 - 99 mg/dL     Constitutional: No distress . Vital signs reviewed. HENT: Normocephalic atraumatic Eyes: EOMI.  No discharge. Cardiovascular: RRR without JVD. Respiratory: CTA Bilaterally. clear.    GI: BS +,non-distended  Skin:   Intact.  Warm and dry. Neuro: Alert and oriented Motor: RUE/RLE: 0/5 proximal to distal Dysarthria No increase in tone noted in right upper extremity Musc/Skel: No edema.  No tenderness. Psych: in good spirits   Assessment/Plan: 1. Functional deficits secondary to Right hemiplegia left subcortical infarcts which require 3+ hours per day of interdisciplinary therapy in a comprehensive inpatient rehab setting. Physiatrist is providing close team supervision and 24 hour management of active medical problems listed below. Physiatrist and rehab team continue to assess barriers to discharge/monitor patient progress toward functional and medical goals. FIM: Function - Bathing Position: Wheelchair/chair  at sink Body parts bathed by patient: Chest, Abdomen, Right upper leg, Left upper leg, Right arm, Right lower leg, Left lower leg, Front perineal area Body parts bathed by helper: Left arm, Buttocks, Back Assist Level: Touching or steadying assistance(Pt > 75%)  Function- Upper Body Dressing/Undressing What is the patient wearing?: Pull over shirt/dress Pull over shirt/dress - Perfomed by patient: Thread/unthread left sleeve, Thread/unthread right sleeve, Pull shirt over trunk, Put head through opening Pull over shirt/dress - Perfomed by helper: Put head through opening Assist Level: Touching or steadying assistance(Pt >  75%) Function - Lower Body Dressing/Undressing What is the patient wearing?: Pants, Shoes, Ted Hose Position: Wheelchair/chair at [employer] - Performed by patient: Thread/unthread right underwear leg, Thread/unthread left underwear leg, Pull underwear up/down Pants- Performed by patient: Thread/unthread right pants leg Pants- Performed by helper: Thread/unthread left pants leg, Pull pants up/down Shoes - Performed by patient: Don/doff left shoe Shoes - Performed by helper: Fasten right, Fasten left, Don/doff right shoe TED Hose - Performed by helper: Don/doff right TED hose, Don/doff left TED hose Assist for footwear: Maximal assist Assist for lower body dressing: Touching or steadying assistance (Pt > 75%)  Function - Toileting Toileting activity did not occur: No continent bowel/bladder event Toileting steps completed by patient: Performs perineal hygiene Toileting steps completed by helper: Adjust clothing prior to toileting, Performs perineal hygiene, Adjust clothing after toileting Toileting Assistive Devices: Grab bar or rail Assist level: Two helpers  Function - Air cabin crew transfer assistive device: Radio broadcast assistant lift: Stedy Assist level to toilet: Maximal assist (Pt 25 - 49%/lift and lower) Assist level from toilet: Maximal assist (Pt 25 - 49%/lift and lower) Assist level to bedside commode (at bedside): Maximal assist (Pt 25 - 49%/lift and lower) Assist level from bedside commode (at bedside): Maximal assist (Pt 25 - 49%/lift and lower)  Function - Chair/bed transfer Chair/bed transfer method: Stand pivot Chair/bed transfer assist level: Moderate assist (Pt 50 - 74%/lift or lower) Chair/bed transfer assistive device: Armrests Chair/bed transfer details: Verbal cues for sequencing, Verbal cues for technique, Tactile cues for weight shifting, Tactile cues for placement  Function - Locomotion: Wheelchair Will patient use wheelchair at discharge?:  Yes Type: Manual Max wheelchair distance: >150 ft   Assist Level: Supervision or verbal cues Assist Level: Supervision or verbal cues Wheel 150 feet activity did not occur: Safety/medical concerns Assist Level: Supervision or verbal cues Turns around,maneuvers to table,bed, and toilet,negotiates 3% grade,maneuvers on rugs and over doorsills: No Function - Locomotion: Ambulation Assistive device: Rail in hallway Max distance: 30 ft Assist level: 2 helpers(max assist + w/c follow for safety) Assist level: 2 helpers Walk 50 feet with 2 turns activity did not occur: Safety/medical concerns Walk 150 feet activity did not occur: Safety/medical concerns Walk 10 feet on uneven surfaces activity did not occur: Safety/medical concerns  Function - Comprehension Comprehension: Auditory Comprehension assist level: Follows basic conversation/direction with extra time/assistive device  Function - Expression Expression: Verbal Expression assist level: Expresses basic 90% of the time/requires cueing < 10% of the time.  Function - Social Interaction Social Interaction assist level: Interacts appropriately with others - No medications needed.  Function - Problem Solving Problem solving assist level: Solves basic 75 - 89% of the time/requires cueing 10 - 24% of the time  Function - Memory Memory assist level: Recognizes or recalls 75 - 89% of the time/requires cueing 10 - 24% of the time Patient normally able to recall (first 3 days only): Current season,  That he or she is in a hospital, Staff names and faces  Medical Problem List and Plan: 1.Right hemiplegia with dysarthria and dysphagiasecondary to left periventricular white matter left corona radiata infarctions as well as history of left brain infarction March 2019  Continue CIR 2. DVT Prophylaxis/Anticoagulation: Subcutaneous Lovenox. Monitor for any bleeding episodes 3. Pain Management:Tylenol as needed 4. Mood:Provide emotional  support 5. Neuropsych: This patientiscapable of making decisions on herown behalf. 6. Skin/Wound Care:Routine skin checks 7. Fluids/Electrolytes/Nutrition: encourage PO 8.Dysphagia. Follow-up speech therapy  Advanced to regular consistency diet on 6/1 9.Hypertension.   Norvasc 5 mg daily, increased to 7.5 on 6/1  Lisinopril 40 mg daily Vitals:   12/27/17 0435 12/27/17 0816  BP: (!) 170/99   Pulse: 78   Resp: 18   Temp: 98 F (36.7 C)   SpO2: 100% 97%   Amlodipine increased to 7.5 on 6/2 will monitor response 10.Diabetes mellitus with peripheral neuropathy. Hemoglobin A1c 8.4. Sliding scale insulin. Check blood sugars before meals and at bedtime. Patient on DiaBeta 2.5 mg daily, insulin glargine 10 units nightly. Resume as needed CBG (last 3)  Recent Labs    12/26/17 1659 12/26/17 2054 12/27/17 0640  GLUCAP 113* 196* 94   cont glyburide 2.56m g qam  Labile on 6/3, monitor cbg 11.Hyperlipidemia. Crestor 12.COPD with tobacco abuse. NicoDerm patch. Provide counseling. Inhalers as directed. 13.5 mm right upper lobe nodule with nonspecific mildly enlarged mediastinal bilateral hilar lymph nodes per CT of the chest 12/08/2017. Recommendations are for noncontrast chest CT 12 months for evaluation 14.  HypoK mild likely nutritional started KCL 52meq 5/23, still low increase to 1meq 5/27, cont this dose for now    Potassium 3.3 on 5/28  Labs ordered for tomorrow 15.  Spasticity   D/c izanidine and increase  baclofen 16.  AKI- D/Ced ACE and Monitor, enc fluids, recheck BMET Stable, cont enc fluids  Creatinine 1.31 on 5/30   17.  Constipation improved, cont.  senna S to 2 po BID,   LOS (Days) 18 A FACE TO FACE EVALUATION WAS PERFORMED  Charlett Blake 12/27/2017, 9:10 AM

## 2017-12-27 NOTE — Progress Notes (Signed)
Speech Language Pathology Daily Session Note  Patient Details  Name: Kelly Gibson MRN: [id number] Date of Birth: 08/27/1960  Today's Date: 12/27/2017 SLP Individual Time: 0900-1000 SLP Individual Time Calculation (min): 60 min  Short Term Goals: Week 3: SLP Short Term Goal 1 (Week 3): Patient will recall new, daily information with use of external aids with Supervision multimodal cues.  SLP Short Term Goal 2 (Week 3): Patient will demonstrate functional problem solving for mildy complex tasks with Min verbal cues.  SLP Short Term Goal 3 (Week 3): Patient will utilize speech intelligibility strategies at the sentence level with supervision verbal cues to achieve 90% of intelligibility.  SLP Short Term Goal 4 (Week 3): Patient will consume trials of regular textures and demonstrate efficient mastication with complete oral clearance without overt s/s of aspiration over 2 sessions prior to upgrade with supervision verbal cues.   Skilled Therapeutic Interventions: Skilled treatment session focused on cognitive goals. SLP facilitated session by providing Max A question cues for utilization of memory compensatory strategies to maximize recall of 5 items that patient needed to locate after a 15 minute delay. Patient utilized the strategy of association to maximize recall which was successful in recalling 2 of 5 items. Patient left upright in wheelchair with all needs within reach and quick release belt in place. Continue with current plan of care.       Function:   Cognition Comprehension Comprehension assist level: Follows basic conversation/direction with extra time/assistive device  Expression   Expression assist level: Expresses basic 90% of the time/requires cueing < 10% of the time.  Social Interaction Social Interaction assist level: Interacts appropriately with others - No medications needed.  Problem Solving Problem solving assist level: Solves basic 75 - 89% of the time/requires cueing  10 - 24% of the time  Memory Memory assist level: Recognizes or recalls 50 - 74% of the time/requires cueing 25 - 49% of the time    Pain No/Denies Pain   Therapy/Group: Individual Therapy  Jamus Loving 12/27/2017, 3:17 PM

## 2017-12-27 NOTE — Progress Notes (Signed)
Occupational Therapy Session Note  Patient Details  Name: Kelly Gibson MRN: [id number] Date of Birth: 02/26/1961  Today's Date: 12/27/2017 OT Individual Time: 1000-1100 OT Individual Time Calculation (min): 60 min    Short Term Goals: Week 1:  OT Short Term Goal 1 (Week 1): Pt will be able to maintain static sitting EOB with S.  OT Short Term Goal 1 - Progress (Week 1): Met OT Short Term Goal 2 (Week 1): Pt will be able to sit at EOB with min A for UB self care with min A.  OT Short Term Goal 2 - Progress (Week 1): Progressing toward goal OT Short Term Goal 3 (Week 1): Pt will be able to transfer to [facility] with mod A of 1. OT Short Term Goal 3 - Progress (Week 1): Met OT Short Term Goal 4 (Week 1): Pt will be able to maintain static stand with mod A to allow caregiver to assist pt with clothing management.  OT Short Term Goal 4 - Progress (Week 1): Met OT Short Term Goal 5 (Week 1): Pt will be able to use L hand to support RUE during bed mobility and transfers.  OT Short Term Goal 5 - Progress (Week 1): Met Week 2:  OT Short Term Goal 1 (Week 2):   OT Short Term Goal 2 (Week 2): Pt will don shirt with min A OT Short Term Goal 2 - Progress (Week 2): Met OT Short Term Goal 3 (Week 2): Pt will thread B LEs into pants with steadying assist OT Short Term Goal 3 - Progress (Week 2): Progressing toward goal OT Short Term Goal 4 (Week 2): Pt will recall and utilize hemi dressing technique mod I OT Short Term Goal 4 - Progress (Week 2): Met Week 3:  OT Short Term Goal 1 (Week 3): STG=LTG due to LOS  Skilled Therapeutic Interventions/Progress Updates:    Pt received in w/c fully dressed and ready for the day. Pt taken to gym and completed squat pivot transfer to mat with min A.  Pt moved into supine to work on RUE tone reduction with pt education on activities to do at home with her family's assist: Bent knee sways for trunk rotation PROM to scapula Proximal to distal internal/ext rotation in  small slow movement patterns Thumb abduction with finger extension Full arm elbow to finger extension  Pt then actively worked on PROM with hands clasped of PNF D1, D2 patterns and pressing arms up overhead at chest level.  Pt moved into sitting for similar exercises of trunk rotations with hands clasped, PNF patterns followed by R hand wt bearing with wt shifts side to side and forward and back. Pt's R arm was very relaxed by end of exercises.  Dynamic reaching of L hand towards L ankle.  Squat pivot back to w/c.  Pt taken back to room with chair alarm on and all needs met.  Therapy Documentation Precautions:  Precautions Precautions: Fall Precaution Comments: bradycardia Restrictions Weight Bearing Restrictions: No RUE Weight Bearing: Weight bearing as tolerated RLE Weight Bearing: Weight bearing as tolerated   Pain: no c/o pain   ADL: ADL ADL Comments: refer to functional navigator  See Function Navigator for Current Functional Status.   Therapy/Group: Individual Therapy  [place] 12/27/2017, 12:40 PM

## 2017-12-27 NOTE — Progress Notes (Addendum)
Physical Therapy Session Note  Patient Details  Name: Kelly Gibson MRN: [id number] Date of Birth: 05/01/1961  Today's Date: 12/27/2017 PT Individual Time: 0803-0900 and 8882-8003 PT Individual Time Calculation (min): 57 min and 25 min  Short Term Goals: Week 3:  PT Short Term Goal 1 (Week 3): STG = LTG due to estimated d/c date.  Skilled Therapeutic Interventions/Progress Updates:  Treatment 1: Pt received in w/c with husband Nadara Mustard) present for family ed. No c/o pain reported by pt. Nadara Mustard reports he plans to take a week or as much time as he needs off work but his 56 y/o granddaughter will be out of school for the summer on June 15th to then provide 24 hr assist to pt - therapist educated on pt's need for 24 hr assist as she is unsafe to complete any mobility tasks on her own & Nadara Mustard voiced understanding. Session focused on hands on training with therapist providing initial demonstration for car transfer but then [name] pt with car, w/c<>bed, and w/c<>couch transfer with supervision from therapist. Therapist provides cuing to position w/c right beside of surface they are transferring to, as well as cuing for pt to push sit>stand instead of holding to husband. Educated [name] on pt's ability to propel w/c with L hemi technique & cuing to attend to R 2/2 R inattention. Also educated him on pt's significant fatigue and need for more assistance in the afternoon with husband voicing understanding. Therapist provides education for pt to complete as much of task as possible before Nadara Mustard assists her, ex: bed mobility. Therapist provides cuing for sequencing to roll L and for sidelying>sit with pt requiring min assist overall for bed mobility. Therapist provided demonstration for curb negotiation in w/c as pt has single step to enter home with Nadara Mustard & pt return demonstrating task. Nadara Mustard demonstrates sufficient safety awareness with all tasks. At end of session pt left in handoff to  SLP.  Treatment 2: Pt received in w/c & agreeable to tx. No c/o pain reported. Transported pt to rail by gym via w/c total assist for time management. Pt completes sit>stand with mod assist with max cuing to push to standing using w/c armrest. Pt ambulates 25 ft with L rail and mod assist with assistance to advance RLE only. Pt attempts to activate RLE but is limited by impaired neuromuscular control and by significant extensor tone. Therapist unable to flex hip/knee to advance LE well and pt also with absent toe clearance therefore toe clearance is an issue as well. Pt requires only slight assistance for balance and this therapist believes pt could potentially be a functional ambulator if not limited by tone. Pt propelled w/c gym>room with L hemi technique and supervision with occasional cuing to avoid obstacles on R and extra time to steer around obstacles. At end of session pt left sitting in w/c with quick release belt & chair alarm donned, needs within reach.   Therapy Documentation Precautions:  Precautions Precautions: Fall Precaution Comments: bradycardia Restrictions Weight Bearing Restrictions: No RUE Weight Bearing: Weight bearing as tolerated RLE Weight Bearing: Weight bearing as tolerated   See Function Navigator for Current Functional Status.   Therapy/Group: Individual Therapy  Waunita Schooner 12/27/2017, 1:32 PM

## 2017-12-28 ENCOUNTER — Inpatient Hospital Stay (HOSPITAL_COMMUNITY): Payer: BLUE CROSS/BLUE SHIELD | Admitting: Speech Pathology

## 2017-12-28 ENCOUNTER — Inpatient Hospital Stay (HOSPITAL_COMMUNITY): Payer: BLUE CROSS/BLUE SHIELD | Admitting: Occupational Therapy

## 2017-12-28 ENCOUNTER — Inpatient Hospital Stay (HOSPITAL_COMMUNITY): Payer: BLUE CROSS/BLUE SHIELD | Admitting: Physical Therapy

## 2017-12-28 ENCOUNTER — Inpatient Hospital Stay (HOSPITAL_COMMUNITY): Payer: BLUE CROSS/BLUE SHIELD

## 2017-12-28 LAB — GLUCOSE, CAPILLARY
Glucose-Capillary: 133 mg/dL — ABNORMAL HIGH (ref 65–99)
Glucose-Capillary: 138 mg/dL — ABNORMAL HIGH (ref 65–99)
Glucose-Capillary: 134 mg/dL — ABNORMAL HIGH (ref 65–99)
Glucose-Capillary: 196 mg/dL — ABNORMAL HIGH (ref 65–99)

## 2017-12-28 MED ORDER — LISINOPRIL 5 MG PO TABS
5.0000 mg | ORAL_TABLET | Freq: Every day | ORAL | Status: DC
  Administered 2017-12-28 – 2017-12-30 (×3): 5 mg via ORAL
  Filled 2017-12-28 (×3): qty 1

## 2017-12-28 MED ORDER — AMLODIPINE BESYLATE 10 MG PO TABS
10.0000 mg | ORAL_TABLET | Freq: Every day | ORAL | Status: DC
  Administered 2017-12-28 – 2017-12-30 (×3): 10 mg via ORAL
  Filled 2017-12-28 (×3): qty 1

## 2017-12-28 MED ORDER — AMLODIPINE BESYLATE 10 MG PO TABS: 10.0000 mg | ORAL_TABLET | Freq: Every day | ORAL | Status: DC

## 2017-12-28 NOTE — Progress Notes (Signed)
Social Work Patient ID: Kelly Gibson, female   DOB: 07-01-1961, 57 y.o.   MRN: [id number]  Met with pt and husband to discuss discharge needs, equipment and follow up. Husband is aware they have no coverage for follow up-home health and OP nor equipment. Husband is trying to get equipment form [employer] where he works. In regards to the follow up he can not afford it and will try to get charity care but may not qualify. Therapy team to give exercise program for home and train husband. Pt is having a bad day today and doesn't feel well. Work toward discharge Thursday, team conference tomorrow.

## 2017-12-28 NOTE — Progress Notes (Signed)
Speech Language Pathology Daily Session Note  Patient Details  Name: Kelly Gibson MRN: [id number] Date of Birth: 1961-01-20  Today's Date: 12/28/2017 SLP Individual Time: 1020-1050 SLP Individual Time Calculation (min): 30 min and Today's Date: 12/28/2017 SLP Missed Time: 15 Minutes Missed Time Reason: Patient fatigue  Short Term Goals: Week 3: SLP Short Term Goal 1 (Week 3): Patient will recall new, daily information with use of external aids with Supervision multimodal cues.  SLP Short Term Goal 2 (Week 3): Patient will demonstrate functional problem solving for mildy complex tasks with Min verbal cues.  SLP Short Term Goal 3 (Week 3): Patient will utilize speech intelligibility strategies at the sentence level with supervision verbal cues to achieve 90% of intelligibility.  SLP Short Term Goal 4 (Week 3): Patient will consume trials of regular textures and demonstrate efficient mastication with complete oral clearance without overt s/s of aspiration over 2 sessions prior to upgrade with supervision verbal cues.   Skilled Therapeutic Interventions: Skilled treatment session focused on speech goals. Upon arrival, patient was asleep in bed but easily awakened and agreeable to participate. SLP facilitated session by providing extra time and Max A multimodal cues for verbal initiation and word-finding during a basic and familiar verbal description task. Patient also demonstrated a low vocal intensity which impacted her overall intelligibility. Patient lethargic throughout session and required constant cues for arousal. RN and MD aware of fatigue and suspect its due to medications. Patient missed remaining 15 mins of session due to inability to participate effectively due to fatigue. Patient left supine in bed with alarm on and all needs within reach. Continue with current plan of care.       Function:  Cognition Comprehension Comprehension assist level: Follows basic conversation/direction with  extra time/assistive device  Expression   Expression assist level: Expresses basic 25 - 49% of the time/requires cueing 50 - 75% of the time. Uses single words/gestures.  Social Interaction Social Interaction assist level: Interacts appropriately 25 - 49% of time - Needs frequent redirection.  Problem Solving Problem solving assist level: Solves basic 75 - 89% of the time/requires cueing 10 - 24% of the time  Memory Memory assist level: Recognizes or recalls 75 - 89% of the time/requires cueing 10 - 24% of the time    Pain No/Denies Pain   Therapy/Group: Individual Therapy  Viktorya Arguijo 12/28/2017, 11:40 AM

## 2017-12-28 NOTE — Progress Notes (Signed)
Orthopedic Tech Progress Note Patient Details:  Kelly Gibson 01/22/1961 [id number]  Patient ID: Kelly Gibson, female   DOB: 02/17/1961, 57 y.o.   MRN: [id number]   [name] 12/28/2017, 9:05 AM Called in hanger brace order; spoke with Caryl Pina

## 2017-12-28 NOTE — Progress Notes (Signed)
Physical Therapy Session Note  Patient Details  Name: Kelly Gibson MRN: [id number] Date of Birth: 12/07/1960  Today's Date: 12/28/2017   Short Term Goals: Week 3:  PT Short Term Goal 1 (Week 3): STG = LTG due to estimated d/c date.  Skilled Therapeutic Interventions/Progress Updates:  Pt received in bed appearing to feel unwell and with delayed verbal responses to therapist's questions. BP assessed: 151/85 mmHg & RN made aware. Pt eventually stating she'd like to rest. Pt missed 75 minutes of skilled PT treatment 2/2 feeling unwell.  Therapy Documentation Precautions:  Precautions Precautions: Fall Precaution Comments: bradycardia Restrictions Weight Bearing Restrictions: No RUE Weight Bearing: Weight bearing as tolerated RLE Weight Bearing: Weight bearing as tolerated General: PT Amount of Missed Time (min): 75 Minutes PT Missed Treatment Reason: Patient fatigue;Patient ill (Comment)   See Function Navigator for Current Functional Status.   Therapy/Group: Individual Therapy  Waunita Schooner 12/28/2017, 1:31 PM

## 2017-12-28 NOTE — Progress Notes (Signed)
Occupational Therapy Session Note  Patient Details  Name: Kelly Gibson MRN: [id number] Date of Birth: 14-Feb-1961  Today's Date: 12/28/2017 OT Individual Time: 3335-4562 OT Individual Time Calculation (min): 65 min  and Today's Date: 12/28/2017 OT Missed Time: 10 Minutes Missed Time Reason: Patient fatigue   Short Term Goals: Week 3:  OT Short Term Goal 1 (Week 3): STG=LTG due to LOS  Skilled Therapeutic Interventions/Progress Updates:    Pt seen for OT session focusing on caregiver training, education, and transfers. Pt sitting up in w/c upon arrival with husband present. Pt self feeding breakfast with no awareness of food residue on R side of face. Pt observation and from husband's report, pt with increased lethargy this morning with decreased attention and requiring significantly increased time and cues for awareness to environmental stimuli and questions from therapist.  In therapy gym, pt's husband assisted with squat pivot transfer to EOM, max A due to pt's decreased attention and motor planning. BP sitting EOM 180/119. Pt placed in supine on mat and RN and MD made aware- requested to return pt back  To room. Pt returned to supine, left with bed alarm on all needs in reach and husband present.  Cont with education with pt's husband regarding R UE ROM, stretching, positioning, continuum of care, DME, and d/c planning.   Therapy Documentation Precautions:  Precautions Precautions: Fall Precaution Comments: bradycardia Restrictions Weight Bearing Restrictions: No RUE Weight Bearing: Weight bearing as tolerated RLE Weight Bearing: Weight bearing as tolerated Pain:   No/denies pain ADL: ADL ADL Comments: refer to functional navigator  See Function Navigator for Current Functional Status.   Therapy/Group: Individual Therapy  Ewen Varnell L 12/28/2017, 6:58 AM

## 2017-12-28 NOTE — Progress Notes (Signed)
Subjective/Complaints: Still having spasm in RUE>RLE Per OT elevated BP with decreased level of alertness Off tizanidine and placed on baclofen yesterday  ROS:  Denies chest pain, shortness of breath, nausea vomiting diarrhea   Objective: Vital Signs: Blood pressure (!) 169/96, pulse 85, temperature 98.4 F (36.9 C), temperature source Oral, resp. rate 18, height 5' 2"  (1.575 m), weight 67.4 kg (148 lb 9.4 oz), last menstrual period 12/10/2012, SpO2 100 %. No results found. Results for orders placed or performed during the hospital encounter of 12/09/17 (from the past 72 hour(s))  Glucose, capillary     Status: None   Collection Time: 12/25/17 12:05 PM  Result Value Ref Range   Glucose-Capillary 86 65 - 99 mg/dL  Glucose, capillary     Status: Abnormal   Collection Time: 12/25/17  4:30 PM  Result Value Ref Range   Glucose-Capillary 205 (H) 65 - 99 mg/dL  Glucose, capillary     Status: Abnormal   Collection Time: 12/25/17  9:27 PM  Result Value Ref Range   Glucose-Capillary 172 (H) 65 - 99 mg/dL  Glucose, capillary     Status: Abnormal   Collection Time: 12/26/17  6:37 AM  Result Value Ref Range   Glucose-Capillary 116 (H) 65 - 99 mg/dL  Glucose, capillary     Status: None   Collection Time: 12/26/17 12:58 PM  Result Value Ref Range   Glucose-Capillary 91 65 - 99 mg/dL  Glucose, capillary     Status: Abnormal   Collection Time: 12/26/17  4:59 PM  Result Value Ref Range   Glucose-Capillary 113 (H) 65 - 99 mg/dL   Comment 1 Notify RN   Glucose, capillary     Status: Abnormal   Collection Time: 12/26/17  8:54 PM  Result Value Ref Range   Glucose-Capillary 196 (H) 65 - 99 mg/dL  Glucose, capillary     Status: None   Collection Time: 12/27/17  6:40 AM  Result Value Ref Range   Glucose-Capillary 94 65 - 99 mg/dL  Glucose, capillary     Status: Abnormal   Collection Time: 12/27/17 11:48 AM  Result Value Ref Range   Glucose-Capillary 105 (H) 65 - 99 mg/dL  Glucose, capillary      Status: Abnormal   Collection Time: 12/27/17  4:45 PM  Result Value Ref Range   Glucose-Capillary 114 (H) 65 - 99 mg/dL  Basic metabolic panel     Status: Abnormal   Collection Time: 12/27/17  6:41 PM  Result Value Ref Range   Sodium 137 135 - 145 mmol/L   Potassium 3.5 3.5 - 5.1 mmol/L   Chloride 104 101 - 111 mmol/L   CO2 23 22 - 32 mmol/L   Glucose, Bld 239 (H) 65 - 99 mg/dL   BUN 19 6 - 20 mg/dL   Creatinine, Ser 0.95 0.44 - 1.00 mg/dL   Calcium 9.1 8.9 - 10.3 mg/dL   GFR calc non Af Amer >60 >60 mL/min   GFR calc Af Amer >60 >60 mL/min    Comment: (NOTE) The eGFR has been calculated using the CKD EPI equation. This calculation has not been validated in all clinical situations. eGFR's persistently <60 mL/min signify possible Chronic Kidney Disease.    Anion gap 10 5 - 15    Comment: Performed at [facility] [address], [place], [place] [address]  CBC with Differential/Platelet     Status: Abnormal   Collection Time: 12/27/17  6:41 PM  Result Value Ref Range  WBC 7.5 4.0 - 10.5 K/uL   RBC 4.40 3.87 - 5.11 MIL/uL   Hemoglobin 12.0 12.0 - 15.0 g/dL   HCT 35.8 (L) 36.0 - 46.0 %   MCV 81.4 78.0 - 100.0 fL   MCH 27.3 26.0 - 34.0 pg   MCHC 33.5 30.0 - 36.0 g/dL   RDW 12.9 11.5 - 15.5 %   Platelets 531 (H) 150 - 400 K/uL   Neutrophils Relative % 60 %   Neutro Abs 4.6 1.7 - 7.7 K/uL   Lymphocytes Relative 26 %   Lymphs Abs 1.9 0.7 - 4.0 K/uL   Monocytes Relative 9 %   Monocytes Absolute 0.7 0.1 - 1.0 K/uL   Eosinophils Relative 4 %   Eosinophils Absolute 0.3 0.0 - 0.7 K/uL   Basophils Relative 1 %   Basophils Absolute 0.1 0.0 - 0.1 K/uL   Immature Granulocytes 0 %   Abs Immature Granulocytes 0.0 0.0 - 0.1 K/uL    Comment: Performed at [facility] [address] [place], [place] [address]  Glucose, capillary     Status: Abnormal   Collection Time: 12/27/17  9:32 PM  Result Value Ref Range   Glucose-Capillary 207 (H) 65 - 99 mg/dL  Glucose,  capillary     Status: Abnormal   Collection Time: 12/28/17  6:17 AM  Result Value Ref Range   Glucose-Capillary 133 (H) 65 - 99 mg/dL     Constitutional: No distress . Vital signs reviewed. HENT: Normocephalic atraumatic Eyes: EOMI.  No discharge. Cardiovascular: RRR without JVD. Respiratory: CTA Bilaterally. clear.    GI: BS +,non-distended  Skin:   Intact.  Warm and dry. Neuro: Alert and oriented Motor: RUE/RLE: 0/5 proximal to distal Dysarthria No increase in tone noted in right upper extremity Musc/Skel: No edema.  No tenderness. Psych: in good spirits   Assessment/Plan: 1. Functional deficits secondary to Right hemiplegia left subcortical infarcts which require 3+ hours per day of interdisciplinary therapy in a comprehensive inpatient rehab setting. Physiatrist is providing close team supervision and 24 hour management of active medical problems listed below. Physiatrist and rehab team continue to assess barriers to discharge/monitor patient progress toward functional and medical goals. FIM: Function - Bathing Position: Wheelchair/chair at sink Body parts bathed by patient: Chest, Abdomen, Right upper leg, Left upper leg, Right arm, Right lower leg, Left lower leg, Front perineal area Body parts bathed by helper: Left arm, Buttocks, Back Assist Level: Touching or steadying assistance(Pt > 75%)  Function- Upper Body Dressing/Undressing What is the patient wearing?: Pull over shirt/dress Pull over shirt/dress - Perfomed by patient: Thread/unthread left sleeve, Thread/unthread right sleeve, Pull shirt over trunk, Put head through opening Pull over shirt/dress - Perfomed by helper: Put head through opening Assist Level: Touching or steadying assistance(Pt > 75%) Function - Lower Body Dressing/Undressing What is the patient wearing?: Pants, Shoes, Ted Hose Position: Wheelchair/chair at [employer] - Performed by patient: Thread/unthread right underwear leg, Thread/unthread  left underwear leg, Pull underwear up/down Pants- Performed by patient: Thread/unthread right pants leg Pants- Performed by helper: Thread/unthread left pants leg, Pull pants up/down Shoes - Performed by patient: Don/doff left shoe Shoes - Performed by helper: Fasten right, Fasten left, Don/doff right shoe TED Hose - Performed by helper: Don/doff right TED hose, Don/doff left TED hose Assist for footwear: Maximal assist Assist for lower body dressing: Touching or steadying assistance (Pt > 75%)  Function - Toileting Toileting activity did not occur: No continent bowel/bladder event Toileting steps completed  by patient: Performs perineal hygiene Toileting steps completed by helper: Adjust clothing prior to toileting, Performs perineal hygiene, Adjust clothing after toileting Toileting Assistive Devices: Grab bar or rail Assist level: Two helpers  Function - Air cabin crew transfer assistive device: Radio broadcast assistant lift: [place] level to toilet: Maximal assist (Pt 25 - 49%/lift and lower) Assist level from toilet: Maximal assist (Pt 25 - 49%/lift and lower) Assist level to bedside commode (at bedside): Maximal assist (Pt 25 - 49%/lift and lower) Assist level from bedside commode (at bedside): Maximal assist (Pt 25 - 49%/lift and lower)  Function - Chair/bed transfer Chair/bed transfer method: Stand pivot Chair/bed transfer assist level: Moderate assist (Pt 50 - 74%/lift or lower) Chair/bed transfer assistive device: Armrests Chair/bed transfer details: Verbal cues for sequencing, Verbal cues for technique, Tactile cues for weight shifting, Tactile cues for placement  Function - Locomotion: Wheelchair Will patient use wheelchair at discharge?: Yes Type: Manual Max wheelchair distance: 150 ft Assist Level: Supervision or verbal cues Assist Level: Supervision or verbal cues Wheel 150 feet activity did not occur: Safety/medical concerns Assist Level: Supervision or  verbal cues Turns around,maneuvers to table,bed, and toilet,negotiates 3% grade,maneuvers on rugs and over doorsills: No Function - Locomotion: Ambulation Assistive device: Rail in hallway Max distance: 25 ft Assist level: Moderate assist (Pt 50 - 74%) Assist level: Moderate assist (Pt 50 - 74%) Walk 50 feet with 2 turns activity did not occur: Safety/medical concerns Walk 150 feet activity did not occur: Safety/medical concerns Walk 10 feet on uneven surfaces activity did not occur: Safety/medical concerns  Function - Comprehension Comprehension: Auditory Comprehension assist level: Follows basic conversation/direction with extra time/assistive device  Function - Expression Expression: Verbal Expression assist level: Expresses basic 90% of the time/requires cueing < 10% of the time.  Function - Social Interaction Social Interaction assist level: Interacts appropriately with others - No medications needed.  Function - Problem Solving Problem solving assist level: Solves basic 75 - 89% of the time/requires cueing 10 - 24% of the time  Function - Memory Memory assist level: Recognizes or recalls 75 - 89% of the time/requires cueing 10 - 24% of the time Patient normally able to recall (first 3 days only): Current season, That he or she is in a hospital, Staff names and faces  Medical Problem List and Plan: 1.Right hemiplegia with dysarthria and dysphagiasecondary to left periventricular white matter left corona radiata infarctions as well as history of left brain infarction March 2019  Continue CIR team conf in am 2. DVT Prophylaxis/Anticoagulation: Subcutaneous Lovenox. Monitor for any bleeding episodes 3. Pain Management:Tylenol as needed 4. Mood:Provide emotional support 5. Neuropsych: This patientiscapable of making decisions on herown behalf. 6. Skin/Wound Care:Routine skin checks 7. Fluids/Electrolytes/Nutrition: encourage PO 8.Dysphagia. Follow-up speech  therapy  Advanced to regular consistency diet on 6/1 9.Hypertension.   Norvasc 5 mg daily, increased to 7.5 on 6/1  Lisinopril 40 mg daily Vitals:   12/27/17 2051 12/28/17 0434  BP: (!) 149/85 (!) 169/96  Pulse: 87 85  Resp: 18 18  Temp: 98 F (36.7 C) 98.4 F (36.9 C)  SpO2: 100% 100%   Amlodipine increased to 7.5 on 6/2 will increase to 63m and add home med lisinopril  10.Diabetes mellitus with peripheral neuropathy. Hemoglobin A1c 8.4. Sliding scale insulin. Check blood sugars before meals and at bedtime. Patient on DiaBeta 2.5 mg daily, insulin glargine 10 units nightly. Resume as needed CBG (last 3)  Recent Labs    12/27/17 1645 12/27/17 2132  12/28/17 0617  GLUCAP 114* 207* 133*   cont glyburide 2.47mg qam  Labile on 6/3, monitor cbg 11.Hyperlipidemia. Crestor 12.COPD with tobacco abuse. NicoDerm patch. Provide counseling. Inhalers as directed. 13.5 mm right upper lobe nodule with nonspecific mildly enlarged mediastinal bilateral hilar lymph nodes per CT of the chest 12/08/2017. Recommendations are for noncontrast chest CT 12 months for evaluation 14.  HypoK mild likely nutritional started KCL 134m 5/23, still low increase to 2045m5/27, cont this dose for now    Potassium 3.3 on 5/28  Labs ordered for tomorrow 15.  Spasticity   D/c baclofen 16.  AKI- resolved, may resume ACE low dose   17.  Constipation improved, cont.  senna S to 2 po BID,   LOS (Days) 19 A FACE TO FACE EVALUATION WAS PERFORMED  AndCharlett Blake4/2019, 8:22 AM

## 2017-12-29 ENCOUNTER — Inpatient Hospital Stay (HOSPITAL_COMMUNITY): Payer: BLUE CROSS/BLUE SHIELD | Admitting: Occupational Therapy

## 2017-12-29 ENCOUNTER — Inpatient Hospital Stay (HOSPITAL_COMMUNITY): Payer: BLUE CROSS/BLUE SHIELD | Admitting: Physical Therapy

## 2017-12-29 ENCOUNTER — Inpatient Hospital Stay (HOSPITAL_COMMUNITY): Payer: BLUE CROSS/BLUE SHIELD | Admitting: Speech Pathology

## 2017-12-29 LAB — URINALYSIS, ROUTINE W REFLEX MICROSCOPIC
BILIRUBIN URINE: NEGATIVE
Bacteria, UA: NONE SEEN
Glucose, UA: 50 mg/dL — AB
Hgb urine dipstick: NEGATIVE
Ketones, ur: NEGATIVE mg/dL
Leukocytes, UA: NEGATIVE
Nitrite: NEGATIVE
Protein, ur: 100 mg/dL — AB
SPECIFIC GRAVITY, URINE: 1.024 (ref 1.005–1.030)
pH: 5 (ref 5.0–8.0)

## 2017-12-29 LAB — GLUCOSE, CAPILLARY
GLUCOSE-CAPILLARY: 123 mg/dL — AB (ref 65–99)
GLUCOSE-CAPILLARY: 177 mg/dL — AB (ref 65–99)
Glucose-Capillary: 129 mg/dL — ABNORMAL HIGH (ref 65–99)
Glucose-Capillary: 156 mg/dL — ABNORMAL HIGH (ref 65–99)

## 2017-12-29 NOTE — Discharge Instructions (Signed)
Inpatient Rehab Discharge Instructions  Kelly Gibson Discharge date and time: No discharge date for patient encounter.   Activities/Precautions/ Functional Status: Activity: activity as tolerated Diet: Mechanical soft diabetic diet Wound Care: none needed Functional status:  ___ No restrictions     ___ Walk up steps independently ___ 24/7 supervision/assistance   ___ Walk up steps with assistance ___ Intermittent supervision/assistance  ___ Bathe/dress independently ___ Walk with walker     _x STROKE/TIA DISCHARGE INSTRUCTIONS SMOKING Cigarette smoking nearly doubles your risk of having a stroke & is the single most alterable risk factor  If you smoke or have smoked in the last 12 months, you are advised to quit smoking for your health.  Most of the excess cardiovascular risk related to smoking disappears within a year of stopping.  Ask you doctor about anti-smoking medications  Kelly Gibson: 1-800-QUIT NOW  Free Smoking Cessation Classes [phone]  CHOLESTEROL Know your levels; limit fat & cholesterol in your diet  Lipid Panel     Component Value Date/Time   CHOL 169 12/06/2017 0459   TRIG 137 12/06/2017 0459   HDL 49 12/06/2017 0459   CHOLHDL 3.4 12/06/2017 0459   VLDL 27 12/06/2017 0459   LDLCALC 93 12/06/2017 0459      Many patients benefit from treatment even if their cholesterol is at goal.  Goal: Total Cholesterol (CHOL) less than 160  Goal:  Triglycerides (TRIG) less than 150  Goal:  HDL greater than 40  Goal:  LDL (LDLCALC) less than 100   BLOOD PRESSURE American Stroke Association blood pressure target is less that 120/80 mm/Hg  Your discharge blood pressure is:  BP: (!) 148/84  Monitor your blood pressure  Limit your salt and alcohol intake  Many individuals will require more than one medication for high blood pressure  DIABETES (A1c is a blood sugar average for last 3 months) Goal HGBA1c is under 7% (HBGA1c is blood sugar average for last 3  months)  Diabetes:    Lab Results  Component Value Date   HGBA1C 8.4 (H) 12/06/2017     Your HGBA1c can be lowered with medications, healthy diet, and exercise.  Check your blood sugar as directed by your physician  Call your physician if you experience unexplained or low blood sugars.  PHYSICAL ACTIVITY/REHABILITATION Goal is 30 minutes at least 4 days per week  Activity: Increase activity slowly, Therapies: Physical Therapy: Home Health Return to work:   Activity decreases your risk of heart attack and stroke and makes your heart stronger.  It helps control your weight and blood pressure; helps you relax and can improve your mood.  Participate in a regular exercise program.  Talk with your doctor about the best form of exercise for you (dancing, walking, swimming, cycling).  DIET/WEIGHT Goal is to maintain a healthy weight  Your discharge diet is:  Diet Order           DIET DYS 3 Room service appropriate? Yes; Fluid consistency: Thin  Diet effective 1000          liquids Your height is:  Height: 5\' 2"  (157.5 cm) Your current weight is: Weight: 67.4 kg (148 lb 9.4 oz) Your Body Mass Index (BMI) is:  BMI (Calculated): 27.17  Following the type of diet specifically designed for you will help prevent another stroke.  Your goal weight range is:    Your goal Body Mass Index (BMI) is 19-24.  Healthy food habits can help reduce 3 risk factors for  stroke:  High cholesterol, hypertension, and excess weight.  RESOURCES Stroke/Support Group:  Call [phone]   STROKE EDUCATION PROVIDED/REVIEWED AND GIVEN TO PATIENT Stroke warning signs and symptoms How to activate emergency medical system (call 911). Medications prescribed at discharge. Need for follow-up after discharge. Personal risk factors for stroke. Pneumonia vaccine given:  Flu vaccine given:  My questions have been answered, the writing is legible, and I understand these instructions.  I will adhere to these goals &  educational materials that have been provided to me after my discharge from the hospital.   __ Bathe/dress with assistance ___ Walk Independently    ___ Shower independently ___ Walk with assistance    ___ Shower with assistance ___ No alcohol     ___ Return to work/school ________  Special Instructions: No smoking or driving.  Follow-up CT of the chest 12 months for incidental findings of 5 mm right upper lobe nodule    COMMUNITY REFERRALS UPON DISCHARGE:   None:  PATIENT'S HEALTH INSURANCE DOES NOT COVER ANY HOME HEALTH OR OUTPATIENT REHAB-WILL GIVE EXERCISE PROGRAM TOO. [place]  Medical Equipment/Items Ordered:HUSBAND TO GET EQUIPMENT FORM HIS PLACE OF WORK-[facility]    GENERAL COMMUNITY RESOURCES FOR PATIENT/FAMILY: Support Groups:[facility] SUPPORT GROUP EVERY SECOND Thursday @ 3:00-4:00 PM ( SEPT-MAY) [place] [phone]  My questions have been answered and I understand these instructions. I will adhere to these goals and the provided educational materials after my discharge from the hospital.  Patient/Caregiver Signature _______________________________ Date __________  Clinician Signature _______________________________________ Date __________  Please bring this form and your medication list with you to all your follow-up doctor's appointments.

## 2017-12-29 NOTE — Progress Notes (Signed)
Social Work Patient ID: Kelly Gibson, female   DOB: April 08, 1961, 57 y.o.   MRN: [id number]  Met with pt and husband this am to discuss discharge still tomorrow and husband has gotten some of the equipment for pt. Pt is better today and doing better since medications discharged. Pt ready to go home tomorrow.

## 2017-12-29 NOTE — Discharge Summary (Signed)
Discharge summary job [phone]

## 2017-12-29 NOTE — Progress Notes (Signed)
Occupational Therapy Session Note  Patient Details  Name: Kelly Gibson MRN: [id number] Date of Birth: 09-10-1960  Today's Date: 12/29/2017 OT Individual Time: 5573-2202 and 1112-1130 OT Individual Time Calculation (min): 60 min and 18 mintes   Short Term Goals: Week 3:  OT Short Term Goal 1 (Week 3): STG=LTG due to LOS  Skilled Therapeutic Interventions/Progress Updates:    Session One: Pt seen for OT ADL bathing/dressing session. Pt sitting up in w/c upon arrival with husband present, agreeable to tx session. Pt much more alert and atttentive today compared to yesterday. Completed stand/squat pivot transfer to drop arm BSC in shower with use of grab bar and max A. She bathed seated on BSC in shower, difficulty maintaining dynamic sitting balance on BSC and requiring assist for repositioning to improve balance. Unsafe attempt to complete squat pivot back to w/c transferring to weaker R side. Therefore, used STEDY for transfer out of shower and return to w/c.  She dressed seated in w/c, able to don shirt with increased time. She stood at sink with mod A, required max A for standing balance and R UE placed in weightbearing position while pants pulled up.  Pt left seated in w/c at end of session, husband present awaiting hand off to SLP.  Pt's husband provided with community resources of local universities with PT/OT programs for follow up therapy services as pt insurance will not cover HH/OP therapies. Also provided resource for sit>stand assist device per husband's request.  Session Two: Pt seen for OT session focusing on UE ROM and self ROM. Pt sitting up in w/c upon arrival, therapy dogs and recreational therapist present. Worked on anterior weight shift to have pt lean forward to reach dog. Pt smiling and laughing throughout time with dogs.  Completed PROM and self ROM exercises for shoulder flexion and elbow flexion/extension.  Pt left seated in w/c at end of session, QRB donned, and all  needs in reach. Pt voiced feeling excited and ready for planned d/c home tomorrow.    Therapy Documentation Precautions:  Precautions Precautions: Fall Precaution Comments: bradycardia Restrictions Weight Bearing Restrictions: No RUE Weight Bearing: Weight bearing as tolerated RLE Weight Bearing: Weight bearing as tolerated Pain:   No/denies pain ADL: ADL ADL Comments: refer to functional navigator  See Function Navigator for Current Functional Status.   Therapy/Group: Individual Therapy  Saif Peter L 12/29/2017, 7:05 AM

## 2017-12-29 NOTE — Patient Care Conference (Signed)
Inpatient RehabilitationTeam Conference and Plan of Care Update Date: 12/29/2017   Time: 10:50 AM    Patient Name: Kelly Gibson      Medical Record Number: [id number]  Date of Birth: [birth year] Sex: Female         Room/Bed: 4W23C/4W23C-01 Payor Info: Payor: BLUE CROSS BLUE SHIELD / Plan: BCBS OTHER / Product Type: *No Product type* /    Admitting Diagnosis: L CVA  Admit Date/Time:  12/09/2017  3:57 PM Admission Comments: No comment available   Primary Diagnosis:  <principal problem not specified> Principal Problem: <principal problem not specified>  Patient Active Problem List   Diagnosis Date Noted  . Poorly controlled type 2 diabetes mellitus with peripheral neuropathy ([place])   . Hypertensive crisis   . Labile blood glucose   . AKI (acute kidney injury) ([place])   . Hypokalemia   . Dysphagia, post-stroke   . CVA (cerebral vascular accident) ([place]) 12/09/2017  . Hemiparesis affecting right side as late effect of cerebrovascular accident (CVA) ([place])   . Dysarthria, post-stroke   . Gait disturbance, post-stroke   . Acute CVA (cerebrovascular accident) ([place]) 12/05/2017  . Bradycardia   . Essential hypertension   . Weakness 10/26/2017  . Cerebrovascular disease 10/18/2017  . COPD (chronic obstructive pulmonary disease) ([place]) 02/18/2015  . Loss of weight 03/15/2014  . Non compliance w medication regimen 06/17/2013  . Post-menopause bleeding 06/16/2013  . Bronchitis 05/04/2012  . Tobacco user 05/04/2012  . Ganglion cyst 03/09/2012  . Anxiety 03/09/2012  . Essential hypertension, benign 12/17/2011  . Obesity, unspecified 12/17/2011  . Hyperlipidemia 12/17/2011  . Depression 12/17/2011  . Diabetes mellitus type II, uncontrolled ([place]) 12/14/2011    Expected Discharge Date: Expected Discharge Date: 12/30/17  Team Members Present: Physician leading conference: Dr. Alysia Penna Social Worker Present: Ovidio Kin, LCSW Nurse Present: Other (comment)(Latoya Foote-RN) PT  Present: Lavone Nian, PT OT Present: Napoleon Form, OT SLP Present: Charolett Bumpers, SLP PPS Coordinator present : Daiva Nakayama, RN, CRRN     Current Status/Progress Goal Weekly Team Focus  Medical   Oversedation, on Baclofen, spasticity still an issue  Maintain medical stability  Discontinue oral spasticity agents, UA equivocal   Bowel/Bladder        Cont B & B occassional incontinence of urine urgency     Swallow/Nutrition/ Hydration   Regular textures with thin liquids, Intermittent supervision   Supervision A  Family Edu   ADL's             Mobility   mod assist transfers, mod assist gait with rail limited by R extensor tone, supervision w/c mobility  min<>mod assist transfers, supervision w/c mobility  transfers, NMR, balance, w/c mobility, pt/family education & d/c planning, endurance training   Communication   Supervision  Supervision A  Family Edu    Safety/Cognition/ Behavioral Observations  Min A  Min A  Family Education    Pain        pain managed by MD     Skin        no skin issues        *See Care Plan and progress notes for long and short-term goals.     Barriers to Discharge  Current Status/Progress Possible Resolutions Date Resolved   Physician    Medical stability;Decreased caregiver support     progressing toward goals  ready for D/C in am      Nursing  PT                    OT                  SLP                SW                Discharge Planning/Teaching Needs:  Home with husband and children to assist wiht care. Pt has no insurance coverage for DME or follow up therapies. Husband has been here every am for education      Team Discussion:  Doing much better today since baclofen and zanadine discharged. MD has increased BP meds. Upgraded to regular diet. Still min-mod assist level. Continent with B & B. Training with husband has been completed. Aware of need for 24 hr care. Gave resources for therapy clinics  Revisions to  Treatment Plan:  DC 6/6    Continued Need for Acute Rehabilitation Level of Care: The patient requires daily medical management by a physician with specialized training in physical medicine and rehabilitation for the following conditions: Daily direction of a multidisciplinary physical rehabilitation program to ensure safe treatment while eliciting the highest outcome that is of practical value to the patient.: Yes Daily medical management of patient stability for increased activity during participation in an intensive rehabilitation regime.: Yes Daily analysis of laboratory values and/or radiology reports with any subsequent need for medication adjustment of medical intervention for : Neurological problems  Illona Bulman, Gardiner Rhyme 12/30/2017, 10:12 AM

## 2017-12-29 NOTE — Progress Notes (Signed)
Subjective/Complaints:  Back to cognitive baseline after Baclofen d/ced, discussed urine result  ROS:  Denies chest pain, shortness of breath, nausea vomiting diarrhea   Objective: Vital Signs: Blood pressure (!) 147/95, pulse 90, temperature (!) 97.5 F (36.4 C), temperature source Oral, resp. rate 18, height 5' 2" (1.575 m), weight 67 kg (147 lb 11.3 oz), last menstrual period 12/10/2012, SpO2 99 %. No results found. Results for orders placed or performed during the hospital encounter of 12/09/17 (from the past 72 hour(s))  Glucose, capillary     Status: None   Collection Time: 12/26/17 12:58 PM  Result Value Ref Range   Glucose-Capillary 91 65 - 99 mg/dL  Glucose, capillary     Status: Abnormal   Collection Time: 12/26/17  4:59 PM  Result Value Ref Range   Glucose-Capillary 113 (H) 65 - 99 mg/dL   Comment 1 Notify RN   Glucose, capillary     Status: Abnormal   Collection Time: 12/26/17  8:54 PM  Result Value Ref Range   Glucose-Capillary 196 (H) 65 - 99 mg/dL  Glucose, capillary     Status: None   Collection Time: 12/27/17  6:40 AM  Result Value Ref Range   Glucose-Capillary 94 65 - 99 mg/dL  Glucose, capillary     Status: Abnormal   Collection Time: 12/27/17 11:48 AM  Result Value Ref Range   Glucose-Capillary 105 (H) 65 - 99 mg/dL  Glucose, capillary     Status: Abnormal   Collection Time: 12/27/17  4:45 PM  Result Value Ref Range   Glucose-Capillary 114 (H) 65 - 99 mg/dL  Basic metabolic panel     Status: Abnormal   Collection Time: 12/27/17  6:41 PM  Result Value Ref Range   Sodium 137 135 - 145 mmol/L   Potassium 3.5 3.5 - 5.1 mmol/L   Chloride 104 101 - 111 mmol/L   CO2 23 22 - 32 mmol/L   Glucose, Bld 239 (H) 65 - 99 mg/dL   BUN 19 6 - 20 mg/dL   Creatinine, Ser 0.95 0.44 - 1.00 mg/dL   Calcium 9.1 8.9 - 10.3 mg/dL   GFR calc non Af Amer >60 >60 mL/min   GFR calc Af Amer >60 >60 mL/min    Comment: (NOTE) The eGFR has been calculated using the CKD EPI  equation. This calculation has not been validated in all clinical situations. eGFR's persistently <60 mL/min signify possible Chronic Kidney Disease.    Anion gap 10 5 - 15    Comment: Performed at [facility] [address], [place], [place] [address]  CBC with Differential/Platelet     Status: Abnormal   Collection Time: 12/27/17  6:41 PM  Result Value Ref Range   WBC 7.5 4.0 - 10.5 K/uL   RBC 4.40 3.87 - 5.11 MIL/uL   Hemoglobin 12.0 12.0 - 15.0 g/dL   HCT 35.8 (L) 36.0 - 46.0 %   MCV 81.4 78.0 - 100.0 fL   MCH 27.3 26.0 - 34.0 pg   MCHC 33.5 30.0 - 36.0 g/dL   RDW 12.9 11.5 - 15.5 %   Platelets 531 (H) 150 - 400 K/uL   Neutrophils Relative % 60 %   Neutro Abs 4.6 1.7 - 7.7 K/uL   Lymphocytes Relative 26 %   Lymphs Abs 1.9 0.7 - 4.0 K/uL   Monocytes Relative 9 %   Monocytes Absolute 0.7 0.1 - 1.0 K/uL   Eosinophils Relative 4 %   Eosinophils Absolute 0.3 0.0 -  0.7 K/uL   Basophils Relative 1 %   Basophils Absolute 0.1 0.0 - 0.1 K/uL   Immature Granulocytes 0 %   Abs Immature Granulocytes 0.0 0.0 - 0.1 K/uL    Comment: Performed at [facility], [place] [address] [place], [place] [address]  Glucose, capillary     Status: Abnormal   Collection Time: 12/27/17  9:32 PM  Result Value Ref Range   Glucose-Capillary 207 (H) 65 - 99 mg/dL  Glucose, capillary     Status: Abnormal   Collection Time: 12/28/17  6:17 AM  Result Value Ref Range   Glucose-Capillary 133 (H) 65 - 99 mg/dL  Glucose, capillary     Status: Abnormal   Collection Time: 12/28/17 11:37 AM  Result Value Ref Range   Glucose-Capillary 138 (H) 65 - 99 mg/dL  Glucose, capillary     Status: Abnormal   Collection Time: 12/28/17  4:50 PM  Result Value Ref Range   Glucose-Capillary 134 (H) 65 - 99 mg/dL  Glucose, capillary     Status: Abnormal   Collection Time: 12/28/17  9:52 PM  Result Value Ref Range   Glucose-Capillary 196 (H) 65 - 99 mg/dL  Urinalysis, Routine w reflex microscopic     Status:  Abnormal   Collection Time: 12/29/17  1:58 AM  Result Value Ref Range   Color, Urine YELLOW YELLOW   APPearance HAZY (A) CLEAR   Specific Gravity, Urine 1.024 1.005 - 1.030   pH 5.0 5.0 - 8.0   Glucose, UA 50 (A) NEGATIVE mg/dL   Hgb urine dipstick NEGATIVE NEGATIVE   Bilirubin Urine NEGATIVE NEGATIVE   Ketones, ur NEGATIVE NEGATIVE mg/dL   Protein, ur 100 (A) NEGATIVE mg/dL   Nitrite NEGATIVE NEGATIVE   Leukocytes, UA NEGATIVE NEGATIVE   RBC / HPF 0-5 0 - 5 RBC/hpf   WBC, UA 11-20 0 - 5 WBC/hpf   Bacteria, UA NONE SEEN NONE SEEN   Squamous Epithelial / LPF 11-20 0 - 5   Mucus PRESENT    Hyaline Casts, UA PRESENT     Comment: Performed at [facility], [place] [address], [place], [place] [address]  Glucose, capillary     Status: Abnormal   Collection Time: 12/29/17  6:21 AM  Result Value Ref Range   Glucose-Capillary 123 (H) 65 - 99 mg/dL     Constitutional: No distress . Vital signs reviewed. HENT: Normocephalic atraumatic Eyes: EOMI.  No discharge. Cardiovascular: RRR without JVD. Respiratory: CTA Bilaterally. clear.    GI: BS +,non-distended  Skin:   Intact.  Warm and dry. Neuro: Alert and oriented Motor: RUE/RLE: 0/5 proximal to distal Dysarthria No increase in tone noted in right upper extremity Musc/Skel: No edema.  No tenderness. Psych: in good spirits   Assessment/Plan: 1. Functional deficits secondary to Right hemiplegia left subcortical infarcts which require 3+ hours per day of interdisciplinary therapy in a comprehensive inpatient rehab setting. Physiatrist is providing close team supervision and 24 hour management of active medical problems listed below. Physiatrist and rehab team continue to assess barriers to discharge/monitor patient progress toward functional and medical goals. FIM: Function - Bathing Position: Wheelchair/chair at sink Body parts bathed by patient: Chest, Abdomen, Right upper leg, Left upper leg, Right arm, Right lower leg, Left  lower leg, Front perineal area Body parts bathed by helper: Left arm, Buttocks, Back Assist Level: Touching or steadying assistance(Pt > 75%)  Function- Upper Body Dressing/Undressing What is the patient wearing?: Pull over shirt/dress Pull over  shirt/dress - Perfomed by patient: Thread/unthread left sleeve, Thread/unthread right sleeve, Pull shirt over trunk, Put head through opening Pull over shirt/dress - Perfomed by helper: Put head through opening Assist Level: Touching or steadying assistance(Pt > 75%) Function - Lower Body Dressing/Undressing What is the patient wearing?: Pants, Shoes, Ted Hose Position: Wheelchair/chair at [employer] - Performed by patient: Thread/unthread right underwear leg, Thread/unthread left underwear leg, Pull underwear up/down Pants- Performed by patient: Thread/unthread right pants leg Pants- Performed by helper: Thread/unthread left pants leg, Pull pants up/down Shoes - Performed by patient: Don/doff left shoe Shoes - Performed by helper: Fasten right, Fasten left, Don/doff right shoe TED Hose - Performed by helper: Don/doff right TED hose, Don/doff left TED hose Assist for footwear: Maximal assist Assist for lower body dressing: Touching or steadying assistance (Pt > 75%)  Function - Toileting Toileting activity did not occur: No continent bowel/bladder event Toileting steps completed by patient: Performs perineal hygiene Toileting steps completed by helper: Adjust clothing prior to toileting, Performs perineal hygiene, Adjust clothing after toileting Toileting Assistive Devices: Grab bar or rail Assist level: Two helpers  Function - Air cabin crew transfer assistive device: Radio broadcast assistant lift: Stedy Assist level to toilet: Maximal assist (Pt 25 - 49%/lift and lower) Assist level from toilet: Maximal assist (Pt 25 - 49%/lift and lower) Assist level to bedside commode (at bedside): Maximal assist (Pt 25 - 49%/lift and  lower) Assist level from bedside commode (at bedside): Maximal assist (Pt 25 - 49%/lift and lower)  Function - Chair/bed transfer Chair/bed transfer method: Stand pivot Chair/bed transfer assist level: Moderate assist (Pt 50 - 74%/lift or lower) Chair/bed transfer assistive device: Armrests Chair/bed transfer details: Verbal cues for sequencing, Verbal cues for technique, Tactile cues for weight shifting, Tactile cues for placement  Function - Locomotion: Wheelchair Will patient use wheelchair at discharge?: Yes Type: Manual Max wheelchair distance: 150 ft Assist Level: Supervision or verbal cues Assist Level: Supervision or verbal cues Wheel 150 feet activity did not occur: Safety/medical concerns Assist Level: Supervision or verbal cues Turns around,maneuvers to table,bed, and toilet,negotiates 3% grade,maneuvers on rugs and over doorsills: No Function - Locomotion: Ambulation Assistive device: Rail in hallway Max distance: 25 ft Assist level: Moderate assist (Pt 50 - 74%) Assist level: Moderate assist (Pt 50 - 74%) Walk 50 feet with 2 turns activity did not occur: Safety/medical concerns Walk 150 feet activity did not occur: Safety/medical concerns Walk 10 feet on uneven surfaces activity did not occur: Safety/medical concerns  Function - Comprehension Comprehension: Auditory Comprehension assist level: Follows basic conversation/direction with extra time/assistive device  Function - Expression Expression: Verbal Expression assist level: Expresses basic 25 - 49% of the time/requires cueing 50 - 75% of the time. Uses single words/gestures.  Function - Social Interaction Social Interaction assist level: Interacts appropriately 25 - 49% of time - Needs frequent redirection.  Function - Problem Solving Problem solving assist level: Solves basic 75 - 89% of the time/requires cueing 10 - 24% of the time  Function - Memory Memory assist level: Recognizes or recalls 75 - 89% of  the time/requires cueing 10 - 24% of the time Patient normally able to recall (first 3 days only): Current season, That he or she is in a hospital, Staff names and faces  Medical Problem List and Plan: 1.Right hemiplegia with dysarthria and dysphagiasecondary to left periventricular white matter left corona radiata infarctions as well as history of left brain infarction March 2019  Continue CIR, Team conference today  please see physician documentation under team conference tab, met with team face-to-face to discuss problems,progress, and goals. Formulized individual treatment plan based on medical history, underlying problem and comorbidities. 2. DVT Prophylaxis/Anticoagulation: Subcutaneous Lovenox. Monitor for any bleeding episodes 3. Pain Management:Tylenol as needed 4. Mood:Provide emotional support 5. Neuropsych: This patientiscapable of making decisions on herown behalf. 6. Skin/Wound Care:Routine skin checks 7. Fluids/Electrolytes/Nutrition: encourage PO 8.Dysphagia. Follow-up speech therapy  Advanced to regular consistency diet on 6/1 9.Hypertension.   Norvasc 66m (home dose)  Lisinopril 40 mg daily (home dose) Vitals:   12/29/17 0446 12/29/17 0831  BP: (!) 146/86 (!) 147/95  Pulse: 90   Resp: 18   Temp: (!) 97.5 F (36.4 C)   SpO2: 99%    Amlodipine increased to 7.5 on 6/2 will increase to 152mand add home med lisinopril at low dose (30m29m 10.Diabetes mellitus with peripheral neuropathy. Hemoglobin A1c 8.4. Sliding scale insulin. Check blood sugars before meals and at bedtime. Patient on DiaBeta 2.5 mg daily, insulin glargine 10 units nightly. Resume as needed CBG (last 3)  Recent Labs    12/28/17 1650 12/28/17 2152 12/29/17 0621  GLUCAP 134* 196* 123*   cont glyburide 2.30m 730mam  Labile on 6/3, monitor cbg 11.Hyperlipidemia. Crestor 12.COPD with tobacco abuse. NicoDerm patch. Provide counseling. Inhalers as directed. 13.5 mm right  upper lobe nodule with nonspecific mildly enlarged mediastinal bilateral hilar lymph nodes per CT of the chest 12/08/2017. Recommendations are for noncontrast chest CT 12 months for evaluation 14.  HypoK mild likely nutritional started KCL 10me67m23, still low increase to 20meq52m7, cont this dose for now    Potassium 3.3 on 5/28, up to 3.5 on 6/3  15.  Spasticity   D/c baclofen 16.  AKI- resolved, may resume ACE low dose, repeat BMET in am   17.  Constipation improved, cont.  senna S to 2 po BID,  18.  MS changes improved off baclofen, UA +WBC but neg bact, await cx LOS (Days) 20 A FACE TO FACE EVALUATION WAS PERFORMED  Kelly Blake019, 8:33 AM

## 2017-12-29 NOTE — Progress Notes (Signed)
Physical Therapy Session Note  Patient Details  Name: Kelly Gibson MRN: [id number] Date of Birth: 08/28/1960  Today's Date: 12/29/2017 PT Individual Time: 0045-9977 and 4142-3953 PT Individual Time Calculation (min): 54 min and 24 min   Short Term Goals: Week 3:  PT Short Term Goal 1 (Week 3): STG = LTG due to estimated d/c date.  Skilled Therapeutic Interventions/Progress Updates:  Treatment 1: Pt received in w/c and agreeable to tx, no c/o pain reported. Pt appearing to feel better compared to yesterday. Transported pt to ortho gym via w/c total assist for time management. Discussed d/c with pt reporting fear of falling and therapist educating her to not get out of bed/chair unless her husband is there to assist her. Completed muscle and sensory testing (please refer to d/c note). Pt completed car transfer at low sedan simulated height with mod assist overall with cuing to push up on armrest instead of pulling on therapist. Pt requires assistance to place RLE in/out of car. Pt propels w/c ortho gym>apartment with supervision, L hemi technique, and significantly extra time and cuing to negotiate threshold to apartment. Pt completes w/c>bed via stand pivot with min assist but mod assist to return to w/c. Pt completes supine<>sitting and rolling L with min assist, supervision for rolling R with max cuing for sequencing and compensatory technique.  At end of session pt left in w/c with quick release belt & chair alarm donned, all needs within reach.  Treatment 2: Pt received in w/c & agreeable to tx. No c/o pain reported but pt appearing fatigued. BP = 154/89 mmHg, HR = 93 bpm but pt reports feeling okay. Pt propels w/c room>nurses station with intermittent Min assist for obstacle avoidance on R and to prevent her from running into wall. Pt requires significantly extra time and encouragement to propel w/c. Pt utilized dynavision from w/c level x 3 minutes with task focusing on R attention/scanning and  increasing activity tolerance. Pt with about equal reaction time to L/R (0.1 seconds slower on R side). At end of session pt left sitting in w/c with quick release belt & chair alarm donned, needs within reach.  Therapy Documentation Precautions:  Precautions Precautions: Fall Precaution Comments: bradycardia Restrictions Weight Bearing Restrictions: No RUE Weight Bearing: Weight bearing as tolerated RLE Weight Bearing: Weight bearing as tolerated    See Function Navigator for Current Functional Status.   Therapy/Group: Individual Therapy  Waunita Schooner 12/29/2017, 1:40 PM

## 2017-12-29 NOTE — Plan of Care (Signed)
LBM 12/26/17

## 2017-12-29 NOTE — Progress Notes (Signed)
Physical Therapy Discharge Summary  Patient Details  Name: Kelly Gibson MRN: [id number] Date of Birth: 05/06/1961  Today's Date: 12/29/2017   Patient has met 8 of 8 long term goals due to improved activity tolerance, improved balance, improved postural control, increased strength, ability to compensate for deficits, improved attention, improved awareness and improved coordination.  Patient to discharge at a wheelchair level min/mod assist for transfers and supervision for w/c mobility with L hemi technique.  Patient's husband has been present for hands on caregiver training and voices comfort and ability to assist pt at home.   Reasons goals not met: n/a  Recommendation:  Patient will benefit from ongoing skilled PT services in home health setting to continue to advance safe functional mobility, address ongoing impairments in impaired strength and neuromuscular control of RLE/RUE, transfers, bed mobility, w/c mobility, gait, decreased endurance, cognitive deficits, and minimize fall risk.  Equipment: 16x16 hemi height w/c with R half-lap tray  Reasons for discharge: treatment goals met  Patient/family agrees with progress made and goals achieved: Yes  PT Discharge Precautions/Restrictions Precautions Precautions: Fall Restrictions Weight Bearing Restrictions: No  Vision/Perception  Pt wears glasses for reading at baseline. Pt reports blurring of vision following medical event.  Perception Perception: (decreased attention to R)   Cognition Overall Cognitive Status: Impaired/Different from baseline Arousal/Alertness: Awake/alert Orientation Level: Oriented X4 Memory: Impaired Memory Impairment: Decreased recall of new information;Decreased short term memory Awareness: Impaired Awareness Impairment: Anticipatory impairment Safety/Judgment: Impaired  Sensation Sensation Light Touch: Impaired by gross assessment Peripheral sensation comments: decreased sensation distal  portion RLE compared to LLE Coordination Gross Motor Movements are Fluid and Coordinated: No Coordination and Movement Description: significant RUE/RLE hemiplegia   Motor  Motor Motor: Hemiplegia;Abnormal tone Motor - Skilled Clinical Observations: RUE/RLE hemiplegia, generalized weakness Motor - Discharge Observations: RUE/RLE hemiplegia, generalized weakness, extensor tone in R hip/knee when weight bearing   Mobility Bed Mobility Bed Mobility: Rolling Right;Rolling Left;Left Sidelying to Sit;Sit to Supine Rolling Right: Supervision/verbal cueing Rolling Right Details: Verbal cues for technique;Verbal cues for sequencing Rolling Left: Minimal Assistance - Patient > 75% Rolling Left Details: Tactile cues for weight shifting;Tactile cues for placement;Tactile cues for posture;Verbal cues for technique;Manual facilitation for weight shifting;Manual facilitation for placement Left Sidelying to Sit: Contact Guard/Touching assist Sit to Supine: Minimal Assistance - Patient > 75% Sit to Supine - Details: Tactile cues for weight shifting;Tactile cues for placement;Verbal cues for technique;Manual facilitation for placement;Manual facilitation for weight shifting(assist with RLE) Transfers Transfers: Squat Pivot Transfers Stand Pivot Transfers: Minimal Assistance - Patient > 75%;Moderate Assistance - Patient 50 - 74% Stand Pivot Transfer Details: Tactile cues for sequencing;Tactile cues for weight shifting;Tactile cues for placement;Verbal cues for technique;Manual facilitation for weight shifting;Manual facilitation for placement Squat Pivot Transfers: Moderate Assistance - Patient 50-74% Squat Pivot Transfer Details: Tactile cues for weight shifting;Tactile cues for posture;Tactile cues for placement;Verbal cues for technique;Verbal cues for sequencing;Manual facilitation for weight shifting;Manual facilitation for placement  Locomotion  Gait Ambulation: Yes Gait Assistance: Moderate  Assistance - Patient 50-74% Ambulation Distance (Feet): 25 Feet Assistive device: (rail in hallway) Ambulation/Gait Assistance Details: Tactile cues for weight shifting;Tactile cues for posture;Verbal cues for technique;Verbal cues for sequencing;Verbal cues for gait pattern;Manual facilitation for weight shifting;Manual facilitation for placement Gait Gait: Yes(pt limited by significant extensor tone in RLE, therapist provides manual faciltiation for advancement, placement and flexing RLE as pt with absent hip/knee flexion and ankle dorsiflexion) Stairs / Additional Locomotion Stairs: No Wheelchair Mobility Wheelchair Mobility: Yes Wheelchair Assistance: 5: Supervision(when  not fatigued) Wheelchair Assistance Details: Verbal cues for technique;Verbal cues for sequencing(verbal cuing to attend to R side) Wheelchair Propulsion: Left upper extremity;Left lower extremity Wheelchair Parts Management: Needs assistance Distance: 150 ft    Trunk/Postural Assessment  Thoracic Assessment Thoracic Assessment: Exceptions to WFL(rounded shoulders) Lumbar Assessment Lumbar Assessment: Exceptions to WFL(posterior pelvic tilt) Postural Control Postural Control: Deficits on evaluation Protective Responses: delayed   Balance Standing balance mod assist during gait with LUE support with rail. Dynamic sitting balance: pt scoots to EOB with LUE support and close supervision.  Extremity Assessment  RUE Assessment RUE Assessment: Exceptions to [facility] Passive Range of Motion (PROM) Comments: WFL (elbow extension limited by increased tone, pt a few degrees from full elbow extension), R wrist extension also limited by increased tone General Strength Comments: trace shoulder activation noted LUE Assessment LUE Assessment: Within Functional Limits RLE Assessment RLE Assessment: Exceptions to [facility] Passive Range of Motion (PROM) Comments: WFL Active Range of Motion (AROM) Comments: impaired General Strength  Comments: 1/5 R knee flexors, (ankle dorsiflexors, knee extensors 0/5 noted 2/2 hemiplegia) LLE Assessment LLE Assessment: Within Functional Limits   See Function Navigator for Current Functional Status.  Waunita Schooner 12/29/2017, 4:37 PM

## 2017-12-29 NOTE — Progress Notes (Signed)
Occupational Therapy Discharge Summary  Patient Details  Name: Kelly Gibson MRN: [id number] Date of Birth: 06-Jun-1961   Patient has met 19 of 12 long term goals due to improved activity tolerance, improved balance, postural control, ability to compensate for deficits, improved attention, improved awareness and improved coordination.  Patient to discharge at overall Mod Assist level.  Patient's care partner is independent to provide the necessary physical and cognitive assistance at discharge.  Pt's husband has been present throughout rehab admission and is aware of the physical and cognitive assist she will require at d/c. He has completed hands on family education and demonstrates willing and ableness to provide needed assist at d/c.  Recommending squat pivot transfers only at d/c and standing at grab bar or sink only as pt not safe to stand at RW.   Reasons goals not met: Pt requires max A for LB dressing. Her level of performance and level of assist required can vary greatly throughout the day depending on UE/LE ton, fatigue level and attention. Although pt has demonstrates ability to complete tub/shower transfer with assist, recommending sponge bathing at d/c due to high fall risk and safety concerns.   Recommendation:  Patient will benefit from ongoing skilled OT services in home health setting to continue to advance functional skills in the area of BADL and Reduce care partner burden. CSW reports pt insurance does not cover follow up services. She will greatly benefit from cont therapy services in order to increase independence with ADLs, decrease caregiver burden and increase functional mobility.   Equipment: Pt's husband to privately purchase w/c and drop arm BSC  Reasons for discharge: treatment goals met and discharge from hospital  Patient/family agrees with progress made and goals achieved: Yes  OT Discharge Precautions/Restrictions  Precautions Precautions:  Fall Restrictions Weight Bearing Restrictions: No ADL ADL ADL Comments: refer to functional navigator Vision Baseline Vision/History: Wears glasses Wears Glasses: Reading only Patient Visual Report: Blurring of vision Vision Assessment?: Yes Visual Fields: No apparent deficits Perception  Perception: Impaired(Decreaesed inattention to R) Praxis Praxis: Impaired Praxis Impairment Details: Motor planning Cognition Overall Cognitive Status: Impaired/Different from baseline Arousal/Alertness: Awake/alert Orientation Level: Oriented X4 Selective Attention: Impaired Selective Attention Impairment: Verbal basic;Functional basic Memory: Impaired Memory Impairment: Decreased recall of new information;Decreased short term memory Decreased Short Term Memory: Verbal basic;Functional basic Awareness: Impaired Awareness Impairment: Anticipatory impairment Problem Solving: Impaired Problem Solving Impairment: Verbal basic;Functional basic Safety/Judgment: Appears intact Sensation Sensation Light Touch: Impaired by gross assessment Peripheral sensation comments: decreased sensation distal portion RLE compared to LLE Light Touch Impaired Details: Impaired RUE;Impaired RLE Hot/Cold: Impaired by gross assessment Proprioception: Impaired Detail Proprioception Impaired Details: Impaired RUE Coordination Gross Motor Movements are Fluid and Coordinated: No Fine Motor Movements are Fluid and Coordinated: No Coordination and Movement Description: significant RUE/RLE hemiplegia Motor  Motor Motor: Hemiplegia;Abnormal tone Motor - Skilled Clinical Observations: RUE/RLE hemiplegia, generalized weakness Mobility  Bed Mobility Bed Mobility: Rolling Right;Rolling Left;Left Sidelying to Sit;Sit to Supine Rolling Right: Supervision/verbal cueing Rolling Right Details: Verbal cues for technique;Verbal cues for sequencing Rolling Left: Minimal Assistance - Patient > 75% Rolling Left Details:  Tactile cues for weight shifting;Tactile cues for placement;Tactile cues for posture;Verbal cues for technique;Manual facilitation for weight shifting;Manual facilitation for placement Left Sidelying to Sit: Contact Guard/Touching assist Sit to Supine: Minimal Assistance - Patient > 75% Sit to Supine - Details: Tactile cues for weight shifting;Tactile cues for placement;Verbal cues for technique;Manual facilitation for placement;Manual facilitation for weight shifting(assist with RLE)  Trunk/Postural Assessment  Cervical Assessment Cervical Assessment: Exceptions to [facility] Thoracic Assessment Thoracic Assessment: Exceptions to WFL(Rounded shoulders; kyphotic) Lumbar Assessment Lumbar Assessment: Exceptions to WFL(Posterior pelvic tilt) Postural Control Postural Control: Deficits on evaluation Protective Responses: delayed  Balance Balance Balance Assessed: Yes Static Sitting Balance Static Sitting - Level of Assistance: 5: Stand by assistance Dynamic Sitting Balance Dynamic Sitting - Balance Support: During functional activity Dynamic Sitting - Level of Assistance: 5: Stand by assistance;4: Min assist Static Standing Balance Static Standing - Level of Assistance: 3: Mod assist Dynamic Standing Balance Dynamic Standing - Balance Support: During functional activity;Bilateral upper extremity supported Dynamic Standing - Level of Assistance: 3: Mod assist Extremity/Trunk Assessment RUE Assessment RUE Assessment: Exceptions to [facility] Passive Range of Motion (PROM) Comments: WFL (elbow extension limited by increased tone, pt a few degrees from full elbow extension), R wrist extension also limited by increased tone General Strength Comments: trace shoulder activation noted RUE Body System: Neuro Brunstrum levels for arm and hand: Arm Brunstrum level for arm: Stage III Synergy is performed voluntarily RUE PROM (degrees) Right Shoulder External Rotation: 100 Degrees RUE Strength RUE Overall  Strength Comments: Limited by tone; no trace movements noted in gravity eliminated RUE Tone RUE Tone: Hypertonic Hypertonic Details: hypertone in internal rotators,elbow flexors, and wrist deviation LUE Assessment LUE Assessment: Within Functional Limits   See Function Navigator for Current Functional Status.  Braison Snoke L 12/29/2017, 5:05 PM

## 2017-12-29 NOTE — Progress Notes (Signed)
Speech Language Pathology Daily Session Note  Patient Details  Name: Kelly Gibson MRN: [id number] Date of Birth: February 16, 1961  Today's Date: 12/29/2017 SLP Individual Time: 0830-0900 SLP Individual Time Calculation (min): 30 min  Short Term Goals: Week 3: SLP Short Term Goal 1 (Week 3): Patient will recall new, daily information with use of external aids with Supervision multimodal cues.  SLP Short Term Goal 2 (Week 3): Patient will demonstrate functional problem solving for mildy complex tasks with Min verbal cues.  SLP Short Term Goal 3 (Week 3): Patient will utilize speech intelligibility strategies at the sentence level with supervision verbal cues to achieve 90% of intelligibility.  SLP Short Term Goal 4 (Week 3): Patient will consume trials of regular textures and demonstrate efficient mastication with complete oral clearance without overt s/s of aspiration over 2 sessions prior to upgrade with supervision verbal cues.   Skilled Therapeutic Interventions: Skilled treatment session focused on completion of family education with the patient's husband. SLP provided education in regards to patient's current cognitive-linguistic function and strategies to utilize at home to maximize recall, problem solving and overall safety. He was also educated on appropriate cueing for utilization of patient's speech intelligibility strategies. He verbalized understanding of all information and handouts were also given to reinforce information. Patient left upright in wheelchair with husband present. Continue with current plan of care.      Function:  Eating Eating   Modified Consistency Diet: No Eating Assist Level: Set up assist   Eating Set Up Assist For: Opening containers;Cutting food       Cognition Comprehension Comprehension assist level: Follows basic conversation/direction with extra time/assistive device  Expression   Expression assist level: Expresses basic 90% of the time/requires  cueing < 10% of the time.  Social Interaction Social Interaction assist level: Interacts appropriately 90% of the time - Needs monitoring or encouragement for participation or interaction.  Problem Solving Problem solving assist level: Solves basic 75 - 89% of the time/requires cueing 10 - 24% of the time  Memory Memory assist level: Recognizes or recalls 75 - 89% of the time/requires cueing 10 - 24% of the time    Pain No/Denies Pain   Therapy/Group: Individual Therapy  Rebacca Votaw 12/29/2017, 3:40 PM

## 2017-12-29 NOTE — Progress Notes (Signed)
Speech Language Pathology Daily Session Note  Patient Details  Name: Kelly Gibson MRN: [id number] Date of Birth: 12-Oct-1960  Today's Date: 12/29/2017 SLP Individual Time: 6578-4696 SLP Individual Time Calculation (min): 37 min  Short Term Goals: Week 3: SLP Short Term Goal 1 (Week 3): Patient will recall new, daily information with use of external aids with Supervision multimodal cues.  SLP Short Term Goal 2 (Week 3): Patient will demonstrate functional problem solving for mildy complex tasks with Min verbal cues.  SLP Short Term Goal 3 (Week 3): Patient will utilize speech intelligibility strategies at the sentence level with supervision verbal cues to achieve 90% of intelligibility.  SLP Short Term Goal 4 (Week 3): Patient will consume trials of regular textures and demonstrate efficient mastication with complete oral clearance without overt s/s of aspiration over 2 sessions prior to upgrade with supervision verbal cues.   Skilled Therapeutic Interventions:  Pt was seen for scheduled make up session.  SLP re-administered the [facility]-Blind to measure progress from initial evaluation.  Pt scored 13/22 on assessment  (N>/18) which is a slight improvement from initial assessment (11/22).  Deficits were most prominent during serial subtraction and recall subtests.  Pt asked therapist about prognosis for recovery and appeared tearful upon learning the results of her assessment.  SLP discussed realistic prognostic expectations for recovery while providing encouragement and reassurance.  Pt discharging home tomorrow.    Function:  Eating Eating   Modified Consistency Diet: No Eating Assist Level: Set up assist   Eating Set Up Assist For: Opening containers;Cutting food       Cognition Comprehension Comprehension assist level: Follows basic conversation/direction with extra time/assistive device  Expression   Expression assist level: Expresses basic 90% of the time/requires cueing < 10% of  the time.  Social Interaction Social Interaction assist level: Interacts appropriately 90% of the time - Needs monitoring or encouragement for participation or interaction.  Problem Solving Problem solving assist level: Solves basic 75 - 89% of the time/requires cueing 10 - 24% of the time  Memory Memory assist level: Recognizes or recalls 75 - 89% of the time/requires cueing 10 - 24% of the time    Pain Pain Assessment Pain Scale: 0-10 Pain Score: 0-No pain  Therapy/Group: Individual Therapy  Braidan Ricciardi, Selinda Orion 12/29/2017, 4:17 PM

## 2017-12-30 LAB — URINE CULTURE

## 2017-12-30 LAB — CREATININE, SERUM
CREATININE: 1.1 mg/dL — AB (ref 0.44–1.00)
GFR calc Af Amer: >60 mL/min (ref >60)
GFR, EST NON AFRICAN AMERICAN: 55 mL/min — AB (ref >60)

## 2017-12-30 LAB — GLUCOSE, CAPILLARY
Glucose-Capillary: 140 mg/dL — ABNORMAL HIGH (ref 65–99)
Glucose-Capillary: 127 mg/dL — ABNORMAL HIGH (ref 65–99)

## 2017-12-30 MED ORDER — UMECLIDINIUM BROMIDE 62.5 MCG/INH IN AEPB: 1.0000 | INHALATION_SPRAY | Freq: Every day | RESPIRATORY_TRACT | 6 refills | Status: DC

## 2017-12-30 MED ORDER — GLYBURIDE 2.5 MG PO TABS: 2.5000 mg | ORAL_TABLET | Freq: Every day | ORAL | 1 refills | Status: DC

## 2017-12-30 MED ORDER — ROSUVASTATIN CALCIUM 40 MG PO TABS: 40.0000 mg | ORAL_TABLET | Freq: Every day | ORAL | 1 refills | Status: DC

## 2017-12-30 MED ORDER — LISINOPRIL 5 MG PO TABS: 5.0000 mg | ORAL_TABLET | Freq: Every day | ORAL | 1 refills | Status: DC

## 2017-12-30 MED ORDER — AMLODIPINE BESYLATE 10 MG PO TABS: 10.0000 mg | ORAL_TABLET | Freq: Every day | ORAL | 1 refills | Status: DC

## 2017-12-30 NOTE — Discharge Summary (Signed)
[name], [place] MEDICAL RECORD [id number] ACCOUNT [id number] DATE OF BIRTH:1960-10-27 FACILITY: [facility] LOCATION: [facility] PHYSICIAN:ANDREW Letta Pate, MD  DISCHARGE SUMMARY  DATE OF DISCHARGE:  12/29/2017  DATE OF ADMISSION:  12/09/2017  DATE OF DISCHARGE:  12/30/2017  DISCHARGE DIAGNOSES: 1.  Left periventricular white matter, left corona radiata infarction. 2.  Subcutaneous Lovenox for deep vein thrombosis for prophylaxis.  3.  Dysphagia -- resolved. 4.  Hypertension. 5.  Diabetes mellitus. 6.  Peripheral neuropathy. 7.  Hyperlipidemia. 8.  Chronic obstructive pulmonary disease with tobacco abuse. 9.  Spasticity. 10.  Acute kidney injury.   HISTORY OF PRESENT ILLNESS: A 57 year old right-handed female with a history of diabetes mellitus, COPD, hypertension, CVA in March with mild right-sided residual weakness maintained on aspirin as well as loop recorder.  Lives with spouse.  Independent  driving prior to admission.  Her husband works during the day.  Presented 12/05/2017 with increasing right-sided weakness, slurred speech and dizziness as well as reported weight loss.  Loop recorder did show 3-4 second pauses.  Initial heart rate in the  30s.  MRI of the brain showed small acute lacunar infarction along the left frontal horn periventricular white matter.  Small subacute lacunar infarction in the left posterior corona radiata.  Numerous chronic lacunar infarcts in the deep gray white  matter.  CT angiogram of head and neck negative for large vessel occlusion.  Positive for widespread atherosclerosis.  Echocardiogram with ejection fraction of 60%, no wall motion abnormalities, grade I diastolic dysfunction.  Neurology followup.   Maintained on aspirin 325 mg daily for CVA prophylaxis.  Subcutaneous Lovenox for DVT prophylaxis.  Dysphagia #2 thin liquid diet.  CT of the chest for evaluation of weight loss that showed nonspecific mildly enlarged mediastinal bilateral hilar lymph   nodes as well as 5 mm upper lobe nodule with recommendations of noncontrast CT of the chest in 12 months.  Physical and occupational therapy evaluations completed.  The patient was admitted for comprehensive rehabilitation program.  PAST MEDICAL HISTORY:  See discharge diagnoses.  SOCIAL HISTORY:  Lives with spouse, independent prior to admission.  FUNCTIONAL STATUS:  Upon admission to rehab services was +2 sit to stand, +2 for supine to sit, mod/max assist with activities of daily living.  PHYSICAL EXAMINATION: VITAL SIGNS:  Blood pressure 153/87, pulse 86, temperature 98, respirations 18. GENERAL:  Alert female in no acute distress.  EOMs intact. NECK:  Supple, nontender.  No JVD. CARDIOVASCULAR:  Rate controlled. ABDOMEN:  Soft, nontender, good bowel sounds. LUNGS:  Clear to auscultation without wheeze.  Speech was mildly dysarthric, but intelligible.  REHABILITATION HOSPITAL COURSE:  The patient was admitted to inpatient rehabilitation services.  Therapies initiated on a 3-hour daily basis, consisting of physical therapy, occupational therapy, speech therapy and rehabilitation nursing.  The following  issues were addressed during patient's rehabilitation stay:  Pertaining to the patient's left periventricular white matter CVA remained stable, maintained on aspirin therapy.  She would follow up with neurology services.  Subcutaneous Lovenox for DVT  prophylaxis.  No bleeding episodes.  Her diet had been advanced to a regular consistency.  Blood pressure is monitored.  Some mild elevations.  Her Norvasc was increased to 10 mg daily.  She continued on lisinopril.  Blood sugars controlled.  Hemoglobin  A1c of 8.4.  She remained on glyburide.  Noted spasticity.  Initial attempts at Zanaflex discontinued due to some increased lethargy, changed to baclofen later discontinued.  AKI resolved.  Again monitored closely while on ACE inhibitor.  Bouts of  constipation, resolved with laxative assistance.   The patient did have a history of COPD with tobacco abuse.  She received counseling in regard to cessation of nicotine products.  The patient received weekly collaborative interdisciplinary team  conferences to discuss estimated length of stay, family teaching, any barriers to her discharge.  Ambulating 25 feet with left rail moderate assist.  Complete sit to stand with moderate assist with max assist cueing, propels her wheelchair with left  hemi-technique.  Activities of daily living and home making, working with dynamic sitting balance, squat pivot back to wheelchair.  The patient and husband went to the ADL apartment, completes tub bench transfers with moderate assist for squat pivot.   Full family teaching was completed and planned discharge to home.  DISCHARGE MEDICATIONS:  Included Norvasc 10 mg p.o. daily, aspirin 325 mg p.o. daily, glyburide 2.5 mg p.o. daily, lisinopril 5 mg p.o. daily, Crestor 40 mg p.o. daily, Ellipta inhaler 1 puff daily, Tylenol as needed.    Her diet was a diabetic diet.  The patient would follow up with Dr. Alysia Penna at the outpatient rehab center as directed; Dr. Lyman Bishop, call for appointment; Dr. Antony Contras neurology services 6 weeks; Dr. Vic Blackbird, medical management.  SPECIAL INSTRUCTIONS:  No driving.  No smoking.  The patient should follow up in the next 12 months for incidental findings of nonspecific mildly enlarged mediastinal bilateral hilar lymph nodes as well as a 5 mm right upper lobe nodule.  TN/NUANCE D:12/29/2017 T:12/29/2017 JOB:[id number]

## 2017-12-30 NOTE — Progress Notes (Signed)
Discharged to home accompanied by spouse. Discharge instructions given by Alcide Goodness, PAC, not questions noted. Belongings taken down with pt via w/c. Kelly Gibson

## 2017-12-30 NOTE — Progress Notes (Signed)
Speech Language Pathology Discharge Summary  Patient Details  Name: Kelly Gibson MRN: [id number] Date of Birth: 01-08-1961  Patient has met 5 of 6 long term goals.  Patient to discharge at [facility] level.   Reasons goals not met: Patient requires overall Mod A for recall of information    Clinical Impression/Discharge Summary: Patient has made functional gains and has met 5 of 6 LTG's this admission. Currently, patient is consuming regular textures with thin liquids with minimal overt s/s of aspiration and overall Mod I for use of swallowing compensatory strategies. Patient also demonstrates improved speech intelligibility and requires overall supervision verbal cues for use of strategies at the phrase and sentence level to achieve 100% intelligibility. Patient also demonstrates improved safety awareness, attention, problem solving and emergent awareness but continues to require overall Mod A multimodal cues for recall with use of strategies. Patient and family education is complete and patient will discharge home with 24 hour supervision from family. Patient would benefit from f/u SLP services to maximize her cognitive function and overall functional independence in order to reduce caregiver burden.   Care Partner:  Caregiver Able to Provide Assistance: Yes  Type of Caregiver Assistance: Physical;Cognitive  Recommendation:  24 hour supervision/assistance;Home Health SLP  Rationale for SLP Follow Up: Reduce caregiver burden;Maximize cognitive function and independence;Maximize functional communication   Equipment: N/A   Reasons for discharge: Discharged from hospital   Patient/Family Agrees with Progress Made and Goals Achieved: Yes     [place], [place] 12/30/2017, 6:58 AM

## 2017-12-30 NOTE — Progress Notes (Signed)
Social Work  Discharge Note  The overall goal for the admission was met for:   Discharge location: [place] DAUGHTER'S TO PROVIDE 24 HR CARE  Length of Stay: Yes-21 DAYS  Discharge activity level: Yes-MIN-MOD LEVEL  Home/community participation: Yes  Services provided included: MD, RD, PT, OT, SLP, RN, CM, TR, Pharmacy, Neuropsych and SW  Financial Services: Private Insurance: [employer] OF [place]  Follow-up services arranged: Other: HAS NO INSURANCE COVERAGE FOR FOLLOW UP OR DME-HUSBAND TO GET FROM [facility]  Comments (or additional information):HUSBAND WAS IN DAILY FOR EDUCATION AND AWARE PT REQUIRES 24 HR CARE. [facility] TO [facility].  Patient/Family verbalized understanding of follow-up arrangements: Yes  Individual responsible for coordination of the follow-up plan: [name]-HUSBAND  Confirmed correct DME delivered: Elease Hashimoto 12/30/2017    Elease Hashimoto

## 2017-12-30 NOTE — Progress Notes (Signed)
Subjective/Complaints:  Excited about going home, discussed spasm in leg on RIgh tshe states it is at night and with standing Right knee bends and ankle shakes  ROS:  Denies chest pain, shortness of breath, nausea vomiting diarrhea   Objective: Vital Signs: Blood pressure 128/80, pulse 79, temperature 98.1 F (36.7 C), resp. rate 17, height 5' 2"  (1.575 m), weight 67 kg (147 lb 11.3 oz), last menstrual period 12/10/2012, SpO2 96 %. No results found. Results for orders placed or performed during the hospital encounter of 12/09/17 (from the past 72 hour(s))  Glucose, capillary     Status: Abnormal   Collection Time: 12/27/17 11:48 AM  Result Value Ref Range   Glucose-Capillary 105 (H) 65 - 99 mg/dL  Glucose, capillary     Status: Abnormal   Collection Time: 12/27/17  4:45 PM  Result Value Ref Range   Glucose-Capillary 114 (H) 65 - 99 mg/dL  Basic metabolic panel     Status: Abnormal   Collection Time: 12/27/17  6:41 PM  Result Value Ref Range   Sodium 137 135 - 145 mmol/L   Potassium 3.5 3.5 - 5.1 mmol/L   Chloride 104 101 - 111 mmol/L   CO2 23 22 - 32 mmol/L   Glucose, Bld 239 (H) 65 - 99 mg/dL   BUN 19 6 - 20 mg/dL   Creatinine, Ser 0.95 0.44 - 1.00 mg/dL   Calcium 9.1 8.9 - 10.3 mg/dL   GFR calc non Af Amer >60 >60 mL/min   GFR calc Af Amer >60 >60 mL/min    Comment: (NOTE) The eGFR has been calculated using the CKD EPI equation. This calculation has not been validated in all clinical situations. eGFR's persistently <60 mL/min signify possible Chronic Kidney Disease.    Anion gap 10 5 - 15    Comment: Performed at [name] [address] [place], [place] [address]  CBC with Differential/Platelet     Status: Abnormal   Collection Time: 12/27/17  6:41 PM  Result Value Ref Range   WBC 7.5 4.0 - 10.5 K/uL   RBC 4.40 3.87 - 5.11 MIL/uL   Hemoglobin 12.0 12.0 - 15.0 g/dL   HCT 35.8 (L) 36.0 - 46.0 %   MCV 81.4 78.0 - 100.0 fL   MCH 27.3 26.0 - 34.0 pg   MCHC  33.5 30.0 - 36.0 g/dL   RDW 12.9 11.5 - 15.5 %   Platelets 531 (H) 150 - 400 K/uL   Neutrophils Relative % 60 %   Neutro Abs 4.6 1.7 - 7.7 K/uL   Lymphocytes Relative 26 %   Lymphs Abs 1.9 0.7 - 4.0 K/uL   Monocytes Relative 9 %   Monocytes Absolute 0.7 0.1 - 1.0 K/uL   Eosinophils Relative 4 %   Eosinophils Absolute 0.3 0.0 - 0.7 K/uL   Basophils Relative 1 %   Basophils Absolute 0.1 0.0 - 0.1 K/uL   Immature Granulocytes 0 %   Abs Immature Granulocytes 0.0 0.0 - 0.1 K/uL    Comment: Performed at [facility] [address] [place], [place] [address]  Glucose, capillary     Status: Abnormal   Collection Time: 12/27/17  9:32 PM  Result Value Ref Range   Glucose-Capillary 207 (H) 65 - 99 mg/dL  Glucose, capillary     Status: Abnormal   Collection Time: 12/28/17  6:17 AM  Result Value Ref Range   Glucose-Capillary 133 (H) 65 - 99 mg/dL  Glucose, capillary  Status: Abnormal   Collection Time: 12/28/17 11:37 AM  Result Value Ref Range   Glucose-Capillary 138 (H) 65 - 99 mg/dL  Glucose, capillary     Status: Abnormal   Collection Time: 12/28/17  4:50 PM  Result Value Ref Range   Glucose-Capillary 134 (H) 65 - 99 mg/dL  Glucose, capillary     Status: Abnormal   Collection Time: 12/28/17  9:52 PM  Result Value Ref Range   Glucose-Capillary 196 (H) 65 - 99 mg/dL  Urinalysis, Routine w reflex microscopic     Status: Abnormal   Collection Time: 12/29/17  1:58 AM  Result Value Ref Range   Color, Urine YELLOW YELLOW   APPearance HAZY (A) CLEAR   Specific Gravity, Urine 1.024 1.005 - 1.030   pH 5.0 5.0 - 8.0   Glucose, UA 50 (A) NEGATIVE mg/dL   Hgb urine dipstick NEGATIVE NEGATIVE   Bilirubin Urine NEGATIVE NEGATIVE   Ketones, ur NEGATIVE NEGATIVE mg/dL   Protein, ur 100 (A) NEGATIVE mg/dL   Nitrite NEGATIVE NEGATIVE   Leukocytes, UA NEGATIVE NEGATIVE   RBC / HPF 0-5 0 - 5 RBC/hpf   WBC, UA 11-20 0 - 5 WBC/hpf   Bacteria, UA NONE SEEN NONE SEEN   Squamous  Epithelial / LPF 11-20 0 - 5   Mucus PRESENT    Hyaline Casts, UA PRESENT     Comment: Performed at [facility], [address], [place], [place] [address]  Glucose, capillary     Status: Abnormal   Collection Time: 12/29/17  6:21 AM  Result Value Ref Range   Glucose-Capillary 123 (H) 65 - 99 mg/dL  Glucose, capillary     Status: Abnormal   Collection Time: 12/29/17 11:33 AM  Result Value Ref Range   Glucose-Capillary 129 (H) 65 - 99 mg/dL  Glucose, capillary     Status: Abnormal   Collection Time: 12/29/17  4:44 PM  Result Value Ref Range   Glucose-Capillary 177 (H) 65 - 99 mg/dL  Glucose, capillary     Status: Abnormal   Collection Time: 12/29/17  9:48 PM  Result Value Ref Range   Glucose-Capillary 156 (H) 65 - 99 mg/dL  Glucose, capillary     Status: Abnormal   Collection Time: 12/30/17  6:37 AM  Result Value Ref Range   Glucose-Capillary 140 (H) 65 - 99 mg/dL  Creatinine, serum     Status: Abnormal   Collection Time: 12/30/17  7:07 AM  Result Value Ref Range   Creatinine, Ser 1.10 (H) 0.44 - 1.00 mg/dL   GFR calc non Af Amer 55 (L) >60 mL/min   GFR calc Af Amer >60 >60 mL/min    Comment: (NOTE) The eGFR has been calculated using the CKD EPI equation. This calculation has not been validated in all clinical situations. eGFR's persistently <60 mL/min signify possible Chronic Kidney Disease. Performed at [facility], [place] [address], [place], [place] [address]      Constitutional: No distress . Vital signs reviewed. HENT: Normocephalic atraumatic Eyes: EOMI.  No discharge. Cardiovascular: RRR without JVD. Respiratory: CTA Bilaterally. clear.    GI: BS +,non-distended  Skin:   Intact.  Warm and dry. Neuro: Alert and oriented Motor: RUE/RLE: 0/5 proximal to distal Dysarthria No increase in tone noted in right upper extremity Musc/Skel: No edema.  No tenderness. Psych: in good spirits   Assessment/Plan: 1. Functional deficits secondary to Right  hemiplegia left subcortical infarcts  Stable for D/C today F/u PCP in  3-4 weeks F/u PM&R 2 weeks See D/C summary See D/C instructionsFIM: Function - Bathing Position: Shower Body parts bathed by patient: Chest, Abdomen, Right upper leg, Left upper leg, Right arm, Right lower leg, Left lower leg, Front perineal area Body parts bathed by helper: Left arm, Buttocks, Back Assist Level: Touching or steadying assistance(Pt > 75%)  Function- Upper Body Dressing/Undressing What is the patient wearing?: Pull over shirt/dress Pull over shirt/dress - Perfomed by patient: Thread/unthread left sleeve, Thread/unthread right sleeve, Pull shirt over trunk, Put head through opening Pull over shirt/dress - Perfomed by helper: Put head through opening Assist Level: Supervision or verbal cues Function - Lower Body Dressing/Undressing What is the patient wearing?: Pants, Shoes, Ted Hose Position: Wheelchair/chair at [employer] - Performed by patient: Thread/unthread right underwear leg, Thread/unthread left underwear leg, Pull underwear up/down Pants- Performed by patient: Thread/unthread right pants leg Pants- Performed by helper: Thread/unthread left pants leg, Pull pants up/down Shoes - Performed by patient: Don/doff left shoe, Fasten left(shoe horns) Shoes - Performed by helper: Don/doff right shoe, Fasten right TED Hose - Performed by helper: Don/doff right TED hose, Don/doff left TED hose Assist for footwear: Partial/moderate assist Assist for lower body dressing: Touching or steadying assistance (Pt > 75%)  Function - Toileting Toileting activity did not occur: No continent bowel/bladder event Toileting steps completed by patient: Performs perineal hygiene, Adjust clothing prior to toileting Toileting steps completed by helper: Adjust clothing after toileting Toileting Assistive Devices: Grab bar or rail Assist level: Two helpers  Function - Air cabin crew transfer assistive  device: Drop arm commode Mechanical lift: Stedy Assist level to toilet: Maximal assist (Pt 25 - 49%/lift and lower) Assist level from toilet: Maximal assist (Pt 25 - 49%/lift and lower) Assist level to bedside commode (at bedside): Touching or steadying assistance (Pt > 75%) Assist level from bedside commode (at bedside): Moderate assist (Pt 50 - 74%/lift or lower)  Function - Chair/bed transfer Chair/bed transfer method: Stand pivot Chair/bed transfer assist level: Touching or steadying assistance (Pt > 75%)(min w/c>bed, mod bed>w/c) Chair/bed transfer assistive device: Armrests Chair/bed transfer details: Tactile cues for sequencing, Tactile cues for weight shifting, Tactile cues for posture, Tactile cues for placement, Verbal cues for technique, Verbal cues for sequencing, Manual facilitation for weight shifting, Manual facilitation for placement  Function - Locomotion: Wheelchair Will patient use wheelchair at discharge?: Yes Type: Manual Max wheelchair distance: 75 ft Assist Level: Supervision or verbal cues Assist Level: Supervision or verbal cues Wheel 150 feet activity did not occur: Safety/medical concerns Assist Level: Supervision or verbal cues Turns around,maneuvers to table,bed, and toilet,negotiates 3% grade,maneuvers on rugs and over doorsills: No Function - Locomotion: Ambulation Ambulation activity did not occur: Safety/medical concerns Assistive device: Rail in hallway Max distance: 25 ft Assist level: Moderate assist (Pt 50 - 74%) Walk 10 feet activity did not occur: Safety/medical concerns Assist level: Moderate assist (Pt 50 - 74%) Walk 50 feet with 2 turns activity did not occur: Safety/medical concerns Walk 150 feet activity did not occur: Safety/medical concerns Walk 10 feet on uneven surfaces activity did not occur: Safety/medical concerns  Function - Comprehension Comprehension: Auditory Comprehension assist level: Follows basic conversation/direction with  extra time/assistive device  Function - Expression Expression: Verbal Expression assist level: Expresses basic 90% of the time/requires cueing < 10% of the time.  Function - Social Interaction Social Interaction assist level: Interacts appropriately 90% of the time - Needs monitoring or encouragement for participation or interaction.  Function - Problem Solving  Problem solving assist level: Solves basic 75 - 89% of the time/requires cueing 10 - 24% of the time  Function - Memory Memory assist level: Recognizes or recalls 75 - 89% of the time/requires cueing 10 - 24% of the time Patient normally able to recall (first 3 days only): Current season, That he or she is in a hospital, Staff names and faces  Medical Problem List and Plan: 1.Right hemiplegia with dysarthria and dysphagiasecondary to left periventricular white matter left corona radiata infarctions as well as history of left brain infarction March 2019  D/C home 2. DVT Prophylaxis/Anticoagulation: Subcutaneous Lovenox. Monitor for any bleeding episodes 3. Pain Management:Tylenol as needed 4. Mood:Provide emotional support 5. Neuropsych: This patientiscapable of making decisions on herown behalf. 6. Skin/Wound Care:Routine skin checks 7. Fluids/Electrolytes/Nutrition: encourage PO 8.Dysphagia. Follow-up speech therapy  Advanced to regular consistency diet on 6/1 9.Hypertension.   Norvasc 22m (home dose)  Lisinopril 40 mg daily (home dose) Vitals:   12/29/17 2157 12/30/17 0404  BP: (!) 144/87 128/80  Pulse: 85 79  Resp: 16 17  Temp: 98.2 F (36.8 C) 98.1 F (36.7 C)  SpO2: 95% 96%   Amlodipine increased to 7.5 on 6/2 will increase to 134mand add home med lisinopril at low dose (86m52mImproved 6/6 10.Diabetes mellitus with peripheral neuropathy. Hemoglobin A1c 8.4. Sliding scale insulin. Check blood sugars before meals and at bedtime. Patient on DiaBeta 2.5 mg daily, insulin glargine 10 units  nightly. Resume as needed CBG (last 3)  Recent Labs    12/29/17 1644 12/29/17 2148 12/30/17 0637  GLUCAP 177* 156* 140*   cont glyburide 2.86m 486mam- controlled  11.Hyperlipidemia. Crestor 12.COPD with tobacco abuse. NicoDerm patch. Provide counseling. Inhalers as directed. 13.5 mm right upper lobe nodule with nonspecific mildly enlarged mediastinal bilateral hilar lymph nodes per CT of the chest 12/08/2017. Recommendations are for noncontrast chest CT 12 months for evaluation 14.  HypoK mild likely nutritional started KCL 10me14m23, still low increase to 20meq60m7, cont this dose for now    Potassium 3.3 on 5/28, up to 3.5 on 6/3  15.  Spasticity   D/c baclofen 16.  AKI- resolved, may resume ACE low dose, repeat Creat stable 6/6  17.  Constipation improved, cont.  senna S to 2 po BID,  18.  MS changes improved off baclofen, UA +WBC but neg bact, await cx LOS (Days) 21 A FACE TO FACE EVALUATION WAS PERFORMED  AndrewCharlett Blake019, 8:44 AM

## 2018-01-03 ENCOUNTER — Telehealth: Payer: Self-pay | Admitting: Registered Nurse

## 2018-01-03 NOTE — Telephone Encounter (Signed)
Transitional Care call Transitional Care Call answered by husband Mr. Mcmackin  Patient name: Kelly Gibson DOB: [birth year] 1. Are you/is patient experiencing any problems since coming home? No a. Are there any questions regarding any aspect of care? No 2. Are there any questions regarding medications administration/dosing? No a. Are meds being taken as prescribed? Yes, Medication List Reviewed b. "Patient should review meds with caller to confirm"  3. Have there been any falls? No 4. Has Home Health been to the house and/or have they contacted you? NA, No Insurance Coverage for Follow Up Care. a. If not, have you tried to contact them? NA b. Can we help you contact them? No 5. Are bowels and bladder emptying properly? Yes a. Are there any unexpected incontinence issues? No b. If applicable, is patient following bowel/bladder programs? NA 6. Any fevers, problems with breathing, unexpected pain? No 7. Are there any skin problems or new areas of breakdown? No 8. Has the patient/family member arranged specialty MD follow up (ie cardiology/neurology/renal/surgical/etc.)?  Mr. Oler states he will be scheduling follow up appointments today.  a. Can we help arrange? NA 9. Does the patient need any other services or support that we can help arrange? No 10. Are caregivers following through as expected in assisting the patient? Yes 11. Has the patient quit smoking, drinking alcohol, or using drugs as recommended? Mr. Bagg states his wife doesn't smoke, drink alcohol or use illicit drugs.  Appointment date/time 01/11/2018, arrival time 09:40 for 10:00 appointment with Dr. Letta Pate. At [place]

## 2018-01-05 NOTE — Progress Notes (Signed)
Social Work Patient ID: Kelly Gibson, female   DOB: 08/19/1960, 57 y.o.   MRN: [id number]   CSW was informed by one of pt's former therapists, Amy Rounds OT, that she received a call from pt's family requesting an order from the PA/MD for pt to received therapy at the [facility].  CSW obtained order and faxed to the clinic with pt's PT d/c summary.  CSW remains available to assist again, if needed.

## 2018-01-11 ENCOUNTER — Other Ambulatory Visit: Payer: Self-pay

## 2018-01-11 ENCOUNTER — Encounter: Payer: Self-pay | Admitting: Physical Medicine & Rehabilitation

## 2018-01-11 ENCOUNTER — Ambulatory Visit: Payer: BLUE CROSS/BLUE SHIELD | Admitting: Physical Medicine & Rehabilitation

## 2018-01-11 ENCOUNTER — Encounter: Payer: BLUE CROSS/BLUE SHIELD | Attending: Physical Medicine & Rehabilitation

## 2018-01-11 VITALS — BP 147/95 | HR 96 | Ht 62.0 in | Wt 140.2 lb

## 2018-01-11 DIAGNOSIS — G8111 Spastic hemiplegia affecting right dominant side: Secondary | ICD-10-CM

## 2018-01-11 DIAGNOSIS — I69351 Hemiplegia and hemiparesis following cerebral infarction affecting right dominant side: Secondary | ICD-10-CM | POA: Insufficient documentation

## 2018-01-11 DIAGNOSIS — Z7984 Long term (current) use of oral hypoglycemic drugs: Secondary | ICD-10-CM | POA: Diagnosis not present

## 2018-01-11 DIAGNOSIS — I10 Essential (primary) hypertension: Secondary | ICD-10-CM | POA: Insufficient documentation

## 2018-01-11 DIAGNOSIS — Z7982 Long term (current) use of aspirin: Secondary | ICD-10-CM | POA: Insufficient documentation

## 2018-01-11 DIAGNOSIS — E119 Type 2 diabetes mellitus without complications: Secondary | ICD-10-CM | POA: Insufficient documentation

## 2018-01-11 NOTE — Patient Instructions (Signed)
Inquire into PACE program  Will give prescription for [facility]

## 2018-01-11 NOTE — Progress Notes (Signed)
Subjective:    Patient ID: Kelly Gibson, female    DOB: 03/19/1961, 57 y.o.   MRN: [id number] 57 year old right-handed female with a history of diabetes mellitus, COPD, hypertension, CVA in March with mild right-sided residual weakness maintained on aspirin as well as loop recorder.  Lives with spouse.  Independent  driving prior to admission.  Her husband works during the day.  Presented 12/05/2017 with increasing right-sided weakness, slurred speech and dizziness as well as reported weight loss.  Loop recorder did show 3-4 second pauses.  Initial heart rate in the  30s.  MRI of the brain showed small acute lacunar infarction along the left frontal horn periventricular white matter.  Small subacute lacunar infarction in the left posterior corona radiata.  Numerous chronic lacunar infarcts in the deep gray white  matter.  CT angiogram of head and neck negative for large vessel occlusion.  Positive for widespread atherosclerosis.  Echocardiogram with ejection fraction of 60%, no wall motion abnormalities, grade I diastolic dysfunction.  Neurology followup.     HPI  Fatigue after 4 pm Fell at home 2 wks ago, tried to reach forward and grab some Husband states LTM is good but STM poor Wakes up ~7a Outpt therapy started today Going to Outpt therapy at [facility]  Has appt with PCP on 6/28 Cardiology appt Dr Debara Pickett  Neurology appt with Dr Leonie Man  Has disability form Pain Inventory Average Pain 0 Pain Right Now 0 My pain is no pain  In the last 24 hours, has pain interfered with the following? General activity 0 Relation with others 0 Enjoyment of life no pain What TIME of day is your pain at its worst? no pain Sleep (in general) NA  Pain is worse with: no pain Pain improves with: no pain Relief from Meds: no pain  Mobility ability to climb steps?  no do you drive?  no use a wheelchair needs help with transfers  Function I need assistance with the following:  dressing, bathing,  toileting, meal prep, household duties and shopping  Neuro/Psych bladder control problems bowel control problems weakness spasms dizziness depression anxiety  Prior Studies Any changes since last visit?  no  Physicians involved in your care Primary care Dr Buelah Manis  appt is 01/19/18 Cardiologist: Dr Debara Pickett  appt is 6//25/19   Family History  Problem Relation Age of Onset  . Diabetes Mother   . Heart disease Father   . Cancer Father    Social History   Socioeconomic History  . Marital status: Married    Spouse name: Not on file  . Number of children: Not on file  . Years of education: Not on file  . Highest education level: Not on file  Occupational History  . Not on file  Social Needs  . Financial resource strain: Not on file  . Food insecurity:    Worry: Not on file    Inability: Not on file  . Transportation needs:    Medical: Not on file    Non-medical: Not on file  Tobacco Use  . Smoking status: Former Research scientist (life sciences)  . Smokeless tobacco: Never Used  . Tobacco comment: stopped 2018  Substance and Sexual Activity  . Alcohol use: No  . Drug use: No  . Sexual activity: Not on file  Lifestyle  . Physical activity:    Days per week: Not on file    Minutes per session: Not on file  . Stress: Not on file  Relationships  . Social connections:  Talks on phone: Not on file    Gets together: Not on file    Attends religious service: Not on file    Active member of club or organization: Not on file    Attends meetings of clubs or organizations: Not on file    Relationship status: Not on file  Other Topics Concern  . Not on file  Social History Narrative  . Not on file   Past Surgical History:  Procedure Laterality Date  . CESAREAN SECTION     x 3  . TUMOR REMOVAL     left shoulder   Past Medical History:  Diagnosis Date  . COPD (chronic obstructive pulmonary disease) ([place])   . Depression   . Diabetes mellitus   . Hyperlipidemia   . Hypertension    BP  (!) 147/95   Pulse 96   Ht 5\' 2"  (1.575 m)   Wt 140 lb 3.2 oz (63.6 kg)   LMP 12/10/2012   SpO2 96%   BMI 25.64 kg/m   Opioid Risk Score:   Fall Risk Score:  `1  Depression screen PHQ 2/9  Depression screen [facility] 2/9 01/11/2018 10/18/2017  Decreased Interest 1 0  Down, Depressed, Hopeless 1 0  PHQ - 2 Score 2 0  Altered sleeping - 0  Tired, decreased energy - 0  Change in appetite - 0  Feeling bad or failure about yourself  - 0  Trouble concentrating - 0  Moving slowly or fidgety/restless - 0  Suicidal thoughts - 0  PHQ-9 Score - 0  Difficult doing work/chores - Not difficult at all     Review of Systems  Constitutional: Positive for unexpected weight change.  HENT: Negative.   Eyes: Negative.   Respiratory: Negative.   Cardiovascular: Positive for leg swelling.  Gastrointestinal: Positive for constipation.  Endocrine:       High blood sugars  Genitourinary:       Bladder control  Musculoskeletal:       Spasms  Skin: Negative.   Allergic/Immunologic: Negative.   Neurological: Positive for dizziness.  Hematological: Negative.   Psychiatric/Behavioral: Positive for dysphoric mood. The patient is nervous/anxious.   All other systems reviewed and are negative.      Objective:   Physical Exam  Constitutional: She is oriented to person, place, and time. She appears well-developed and well-nourished. No distress.  HENT:  Head: Normocephalic and atraumatic.  Eyes: Pupils are equal, round, and reactive to light. EOM are normal.  Neck: Normal range of motion. Neck supple.  Cardiovascular: Normal rate, regular rhythm and normal heart sounds. Exam reveals no friction rub.  No murmur heard. Pulmonary/Chest: Effort normal and breath sounds normal. No stridor. No respiratory distress.  Abdominal: Soft. Bowel sounds are normal. She exhibits no distension. There is no tenderness.  Musculoskeletal:       Right shoulder: Normal.  Reduced active range of motion on the right  side does have passive range which is full but limited by tone in the upper extremity full in the lower extremity Left upper extremity left lower extremity has full range of motion without pain  Neurological: She is alert and oriented to person, place, and time. No sensory deficit. She exhibits abnormal muscle tone. Coordination and gait abnormal.  Patient is nonambulatory in wheelchair she needs assistance of one person to stand  Skin: Skin is warm and dry. She is not diaphoretic.  Psychiatric: Thought content normal. Her affect is blunt and inappropriate. Her speech is delayed and slurred.  She is slowed. She exhibits abnormal recent memory.  Inappropriate laughing  Nursing note and vitals reviewed.   MAS 3 in R Biceps FCR FCU FDS FDP  R Gastrosoleus  0/5 strength Right UE and RLE         Assessment & Plan:  1.  Left frontal white matter and left corona radiata infarcts with resultant severe RIght spastic hemiplegia  Poorly responsive to oral meds and PT, she does require outpatient PT to work on mobility upper extremity range of motion neuromuscular reeducation R Biceps 50 FCR 50 FCU 25 FDS 50 FDP 50  R Gastrosoleus 75  2.  Cognitive deficits related to multiple periventricular infarcts some of which are chronic but others that are more acute, as discussed with the patient and her husband she will likely not regain premorbid cognitive functioning but still may make some significant improvement over the next 12 months  No driving Physical medicine and rehabilitation follow-up in 1-2 weeks for Botox  3.  Hypertension follow-up with primary care, currently on amlodipine 10 mg, Prinivil 5 mg daily  4.  Diabetes, good control in hospital, continue glyburide 2.5 mg for now primary care will follow-up  #5 -secondary stroke prophylaxis continue aspirin 325 mg/day, follow-up with neurology

## 2018-01-14 ENCOUNTER — Encounter: Payer: Self-pay | Admitting: Family Medicine

## 2018-01-18 ENCOUNTER — Encounter: Payer: Self-pay | Admitting: Physician Assistant

## 2018-01-18 ENCOUNTER — Ambulatory Visit (INDEPENDENT_AMBULATORY_CARE_PROVIDER_SITE_OTHER): Payer: BLUE CROSS/BLUE SHIELD | Admitting: Physician Assistant

## 2018-01-18 VITALS — BP 138/86 | HR 99 | Ht 62.0 in | Wt 141.0 lb

## 2018-01-18 DIAGNOSIS — Z8673 Personal history of transient ischemic attack (TIA), and cerebral infarction without residual deficits: Secondary | ICD-10-CM | POA: Diagnosis not present

## 2018-01-18 DIAGNOSIS — R001 Bradycardia, unspecified: Secondary | ICD-10-CM | POA: Diagnosis not present

## 2018-01-18 DIAGNOSIS — I10 Essential (primary) hypertension: Secondary | ICD-10-CM | POA: Diagnosis not present

## 2018-01-18 DIAGNOSIS — E785 Hyperlipidemia, unspecified: Secondary | ICD-10-CM | POA: Diagnosis not present

## 2018-01-18 DIAGNOSIS — E119 Type 2 diabetes mellitus without complications: Secondary | ICD-10-CM

## 2018-01-18 NOTE — Patient Instructions (Signed)
Medication Instructions:  Your physician recommends that you continue on your current medications as directed. Please refer to the Current Medication list given to you today.  Labwork: None   Testing/Procedures: None   Follow-Up: Your provider recommends that you schedule a follow-up appointment in: 3 months with Dr Debara Pickett.   Any Other Special Instructions Will Be Listed Below (If Applicable). PLEASE CONTACT OFFICE IF YOU START EXPERIENCING DIZZINESS, BLURRED VISION OR FEELING YOUR GOING TO PASS OUT.   If you need a refill on your cardiac medications before your next appointment, please call your pharmacy.

## 2018-01-18 NOTE — Progress Notes (Signed)
Cardiology Office Note    Date:  01/20/2018   ID:  Kelly Gibson, DOB 06/21/1960, MRN [id number]  PCP:  Alycia Rossetti, MD  Cardiologist: Dr. Debara Pickett Electrophysiologist: Dr. Rayann Heman  Chief Complaint  Patient presents with  . Follow-up    seen for Dr. Debara Pickett.     History of Present Illness:  Kelly Gibson is a 57 y.o. female with past medical history of hypertension, hyperlipidemia, DM II, COPD, depression and recent CVA.  Patient had a subacute CVA in [facility] in February 2019.  She was admitted to [facility] on 10/26/2017 again with possible recurrence of CVA.  Initial MRI of the brain was negative for acute CVA.  Patient was found to have severely elevated blood pressure was systolic blood pressure in the 190s.  She was also noted to be bradycardic in the hospital with heart rate into the 40s.  She was not on any AV nodal blocking agent.  Echocardiogram showed EF 60 to 65%, moderate LVH, trace to mild MR, grade 2 DD with elevated LVEDP.  Lisinopril was increased to 40 mg daily.  Hydrochlorothiazide was decreased to 25 mg daily.  Amlodipine 5 mg daily was added.  Outpatient 30-day event monitor was recommended to look for symptomatic bradycardia.  Patient was readmitted in May 2019 with CVA.  MRI showed small acute lacunar infarct along with left frontal horn ventricular white matter.  Subacute lacunar infarction in the left posterior corona radiata.  Numerous chronic lacunar infarct in the deep gray-white matter.  She was placed on 325 mg daily aspirin for CVA prophylaxis.  Telemetry during this admission showed a sinus rhythm, nocturnal sinus pauses, occasional daytime pauses.  Patient was seen by Dr. Rayann Heman, it was recommended to avoid AV nodal blocking agent, otherwise no obvious sign of symptomatic bradycardia to suggest pacemaker.  It was recommended also to consider outpatient sleep study as well once improved from stroke.  Post hospital, patient was sent to rehab  facility.  Patient presents today for cardiology office visit.  Since her discharge, she continue to have significant weakness on the right side.  She is unable to lift up her right arm or right leg.  She is doing physical therapy at this time.  She does not have any lower extremity edema, orthopnea or PND.  Since discharge, she has not had any significant dizziness, blurred vision or feeling of passing out.  On review of the recent event monitor, she does have sinus pauses up to 3.5 seconds, occasional junctional beats and a sinus bradycardia, this mainly occur at night when she is asleep.  Given lack of active symptoms to suggest symptomatic bradycardia, at this time, there is no plan to proceed with pacemaker.  This was discussed between Dr. Debara Pickett and Dr. Rayann Heman of electrophysiology service as well.  Patient is aware that if she develop symptoms later on, she will need to let us know so we can consider a pacemaker implantation.  It is very possible that she will degenerate into more prolonged electrical conduction issues down the road.   Past Medical History:  Diagnosis Date  . COPD (chronic obstructive pulmonary disease) ([place])   . Depression   . Diabetes mellitus   . Hyperlipidemia   . Hypertension     Past Surgical History:  Procedure Laterality Date  . CESAREAN SECTION     x 3  . TUMOR REMOVAL     left shoulder    Current Medications: Outpatient Medications Prior to  Visit  Medication Sig Dispense Refill  . albuterol (PROVENTIL HFA;VENTOLIN HFA) 108 (90 Base) MCG/ACT inhaler Inhale 2 puffs into the lungs every 6 (six) hours as needed for wheezing or shortness of breath. 1 Inhaler 1  . amLODipine (NORVASC) 10 MG tablet Take 1 tablet (10 mg total) by mouth daily. 30 tablet 1  . aspirin EC 325 MG EC tablet Take 1 tablet (325 mg total) by mouth daily. 30 tablet 0  . lisinopril (PRINIVIL,ZESTRIL) 5 MG tablet Take 1 tablet (5 mg total) by mouth daily. 30 tablet 1  . rosuvastatin (CRESTOR)  40 MG tablet Take 1 tablet (40 mg total) by mouth daily at 6 PM. 30 tablet 1  . senna-docusate (SENOKOT-S) 8.6-50 MG tablet Take 1 tablet by mouth at bedtime as needed for moderate constipation.    Marland Kitchen umeclidinium bromide (INCRUSE ELLIPTA) 62.5 MCG/INH AEPB Inhale 1 puff into the lungs daily. 30 each 6  . glyBURIDE (DIABETA) 2.5 MG tablet Take 1 tablet (2.5 mg total) by mouth daily with breakfast. 30 tablet 1   No facility-administered medications prior to visit.      Allergies:   Niacin and related   Social History   Socioeconomic History  . Marital status: Married    Spouse name: Not on file  . Number of children: Not on file  . Years of education: Not on file  . Highest education level: Not on file  Occupational History  . Not on file  Social Needs  . Financial resource strain: Not on file  . Food insecurity:    Worry: Not on file    Inability: Not on file  . Transportation needs:    Medical: Not on file    Non-medical: Not on file  Tobacco Use  . Smoking status: Former Research scientist (life sciences)  . Smokeless tobacco: Never Used  . Tobacco comment: stopped 2018  Substance and Sexual Activity  . Alcohol use: No  . Drug use: No  . Sexual activity: Not on file  Lifestyle  . Physical activity:    Days per week: Not on file    Minutes per session: Not on file  . Stress: Not on file  Relationships  . Social connections:    Talks on phone: Not on file    Gets together: Not on file    Attends religious service: Not on file    Active member of club or organization: Not on file    Attends meetings of clubs or organizations: Not on file    Relationship status: Not on file  Other Topics Concern  . Not on file  Social History Narrative  . Not on file     Family History:  The patient's family history includes Cancer in her father; Diabetes in her mother; Heart disease in her father.   ROS:   Please see the history of present illness.    ROS All other systems reviewed and are  negative.   PHYSICAL EXAM:   VS:  BP 138/86   Pulse 99   Ht 5\' 2"  (1.575 m)   Wt 141 lb (64 kg)   LMP 12/10/2012   BMI 25.79 kg/m    GEN: Well nourished, well developed, in no acute distress  HEENT: normal  Neck: no JVD, carotid bruits, or masses Cardiac: RRR; no murmurs, rubs, or gallops,no edema  Respiratory:  clear to auscultation bilaterally, normal work of breathing GI: soft, nontender, nondistended, + BS MS: no deformity or atrophy  Skin: warm and dry, no rash  Neuro:  Alert and Oriented x 3, Strength and sensation are intact Psych: euthymic mood, full affect  Wt Readings from Last 3 Encounters:  01/18/18 141 lb (64 kg)  01/11/18 140 lb 3.2 oz (63.6 kg)  12/29/17 147 lb 11.3 oz (67 kg)      Studies/Labs Reviewed:   EKG:  EKG is not ordered today.    Recent Labs: 12/07/2017: Magnesium 1.7 12/10/2017: ALT 17 12/27/2017: BUN 19; Hemoglobin 12.0; Platelets 531; Potassium 3.5; Sodium 137 12/30/2017: Creatinine, Ser 1.10   Lipid Panel    Component Value Date/Time   CHOL 169 12/06/2017 0459   TRIG 137 12/06/2017 0459   HDL 49 12/06/2017 0459   CHOLHDL 3.4 12/06/2017 0459   VLDL 27 12/06/2017 0459   LDLCALC 93 12/06/2017 0459   LDLDIRECT 217 (H) 12/13/2015 1653    Additional studies/ records that were reviewed today include:   Echo 10/27/2017 LV EF: 55% -   60% Study Conclusions  - Left ventricle: The cavity size was normal. Wall thickness was   increased in a pattern of mild LVH. Systolic function was normal.   The estimated ejection fraction was in the range of 55% to 60%.   Wall motion was normal; there were no regional wall motion   abnormalities. Features are consistent with a pseudonormal left   ventricular filling pattern, with concomitant abnormal relaxation   and increased filling pressure (grade 2 diastolic dysfunction). - Aortic valve: There was no stenosis. - Mitral valve: There was trivial regurgitation. - Right ventricle: The cavity size was  normal. Systolic function   was normal. - Pulmonary arteries: No complete TR doppler jet so unable to   estimate PA systolic pressure. - Inferior vena cava: The vessel was normal in size. The   respirophasic diameter changes were in the normal range (>= 50%),   consistent with normal central venous pressure.  Impressions:  - Normal LV size with mild LV hypertrophy. EF 55-60%. There was   moderate diastolic dysfunction. Normal RV size and systolic   function.   ASSESSMENT:    1. Bradycardia   2. H/O: CVA (cerebrovascular accident)   3. Essential hypertension   4. Hyperlipidemia, unspecified hyperlipidemia type   5. Controlled type 2 diabetes mellitus without complication, without long-term current use of insulin (HCC)      PLAN:  In order of problems listed above:  1. Bradycardia: Patient recently worn an event monitor which showed up to 3.5-second pause and some junctional rhythm, this mainly occurred in sleep and the patient does not have any symptoms.  There is no current plan for pacemaker placement at this time.  There is current underlying conduction disease, this may progress in the future and she ultimately may need a pacemaker down the road.  2. History of multiple CVA: Continue to have significant right-sided weakness.  On high-dose aspirin as recommended by neurology.  No evidence of atrial fibrillation on recent event monitor  3. Hypertension: Blood pressure well controlled  4. Hyperlipidemia: On Crestor 40 mg daily.  Recent lab work showed a total cholesterol 169, triglyceride 137, HDL 49, LDL 93.  5. DM2: Managed by primary care provider    Medication Adjustments/Labs and Tests Ordered: Current medicines are reviewed at length with the patient today.  Concerns regarding medicines are outlined above.  Medication changes, Labs and Tests ordered today are listed in the Patient Instructions below. Patient Instructions  Medication Instructions:  Your physician  recommends that you continue on your current medications as  directed. Please refer to the Current Medication list given to you today.  Labwork: None   Testing/Procedures: None   Follow-Up: Your provider recommends that you schedule a follow-up appointment in: 3 months with Dr Debara Pickett.   Any Other Special Instructions Will Be Listed Below (If Applicable). PLEASE CONTACT OFFICE IF YOU START EXPERIENCING DIZZINESS, BLURRED VISION OR FEELING YOUR GOING TO PASS OUT.   If you need a refill on your cardiac medications before your next appointment, please call your pharmacy.     Hilbert Corrigan, [place]  01/20/2018 7:54 PM    [facility] [place], [place],   [address] Phone: [phone]; Fax: [phone]

## 2018-01-19 ENCOUNTER — Ambulatory Visit (INDEPENDENT_AMBULATORY_CARE_PROVIDER_SITE_OTHER): Payer: BLUE CROSS/BLUE SHIELD | Admitting: Family Medicine

## 2018-01-19 ENCOUNTER — Other Ambulatory Visit: Payer: Self-pay

## 2018-01-19 ENCOUNTER — Encounter: Payer: Self-pay | Admitting: Family Medicine

## 2018-01-19 VITALS — BP 134/72 | HR 82 | Temp 98.2°F | Resp 16

## 2018-01-19 DIAGNOSIS — I10 Essential (primary) hypertension: Secondary | ICD-10-CM | POA: Diagnosis not present

## 2018-01-19 DIAGNOSIS — E1165 Type 2 diabetes mellitus with hyperglycemia: Secondary | ICD-10-CM | POA: Diagnosis not present

## 2018-01-19 DIAGNOSIS — I679 Cerebrovascular disease, unspecified: Secondary | ICD-10-CM | POA: Diagnosis not present

## 2018-01-19 DIAGNOSIS — I6300 Cerebral infarction due to thrombosis of unspecified precerebral artery: Secondary | ICD-10-CM | POA: Diagnosis not present

## 2018-01-19 DIAGNOSIS — I69391 Dysphagia following cerebral infarction: Secondary | ICD-10-CM | POA: Diagnosis not present

## 2018-01-19 MED ORDER — GLYBURIDE 2.5 MG PO TABS: 2.5000 mg | ORAL_TABLET | Freq: Two times a day (BID) | ORAL | 6 refills | Status: DC

## 2018-01-19 NOTE — Patient Instructions (Addendum)
Will try to get you thickener for liquids  Glyburide changed to 2.5mg  twice a day  F/U 2 months

## 2018-01-19 NOTE — Progress Notes (Signed)
Subjective:    Patient ID: Kelly Gibson, female    DOB: 06/21/1960, 57 y.o.   MRN: [id number]  Patient presents for Hospital F/U (CVA)  Reviewed hospital records  Patient here for hospital follow-up.  Since her last visit in April she was admitted a month later with another stroke on the left side.  This resulted in some dysphasia and generalized weakness.  She was admitted to rehab at [facility].  She presented she had increasing right-sided weakness slurred speech and dizziness.  Her loop recorder did show a few pauses.  She also had bradycardia noted.  Her MRI of the brain confirmed the new stroke. Her CT Angie of head did not show any large vessel occlusions but she had widespread atherosclerosis.  Her cardiac work-up included echocardiogram she had preserved ejection fraction with grade 1 diastolic dysfunction.  She was placed on aspirin 325 mg daily.  She graduated eating regular diet, but, when she drinks the water she chokes  Her pills go into yogurt    Work-up also showed a mildly enlarged mediastinal lateral hilar lymph nodes as well as a 5 mm upper lobe nodule that needs to be reevaluated in 6 to 12 months.  Her A1c was 8.4% in May the month prior her A1c was 12.2%.. See previous note she had recently reestablish care after having a stroke out of town.  She was noncompliant with her medications.  Taken off of metformin secondary to acute on chronic kidney disease.  It seems that her insulin was also discontinued she was on 10 units of Tresiba  CBG fasting 120-200 after evening up to 250's  Lipid panel from 12/06/2017 was normal.  She is currently on Crestor  Review Of Systems:  GEN- denies fatigue, fever, weight loss,weakness, recent illness HEENT- denies eye drainage, change in vision, nasal discharge, CVS- denies chest pain, palpitations RESP- denies SOB, cough, wheeze ABD- denies N/V, change in stools, abd pain GU- denies dysuria, hematuria, dribbling,  incontinence MSK- denies joint pain, muscle aches, injury Neuro- denies headache, dizziness, syncope, seizure activity       Objective:    BP 134/72   Pulse 82   Temp 98.2 F (36.8 C) (Oral)   Resp 16   LMP 12/10/2012   SpO2 97%  GEN- NAD, alert and oriented x3,sitting in wheelchair HEENT- PERRL, EOMI, non injected sclera, pink conjunctiva, MMM, oropharynx clear Neck- Supple, no thyromegaly CVS- RRR, no murmur RESP-CTAB ABD-NABS,soft,NT,ND Neuro- facial droop left side, slurred speech, hemiparesis RUE/RLE EXT-trace  Edema RLE Pulses- Radial, DP- 2+        Assessment & Plan:      Problem List Items Addressed This Visit      Unprioritized   Cerebrovascular disease    Residual right hemiparesis symptoms F/u per neurology, on ASA Continue PT Unable to afford speech therapy add thickner as she still chokes with thin liquids        Diabetes mellitus type II, uncontrolled (HCC) - Primary    Uncontrolled, increase glyburide to 2.5mg  BID with meals Recheck A1C in a few months       Relevant Medications   glyBURIDE (DIABETA) 2.5 MG tablet   Dysphagia, post-stroke   Essential hypertension, benign    Blood pressure looks okay s/p repeat stroke Will continue to monitor for now No change to medication       Other Visit Diagnoses    Cerebrovascular accident (CVA) due to thrombosis of precerebral artery ([place])  Note: This dictation was prepared with Dragon dictation along with smaller phrase technology. Any transcriptional errors that result from this process are unintentional.

## 2018-01-20 ENCOUNTER — Encounter: Payer: Self-pay | Admitting: Physician Assistant

## 2018-01-20 ENCOUNTER — Encounter: Payer: Self-pay | Admitting: Family Medicine

## 2018-01-20 NOTE — Assessment & Plan Note (Signed)
>>  ASSESSMENT AND PLAN FOR TYPE 2 DIABETES MELLITUS WITH DIABETIC NEUROPATHY, WITH LONG-TERM CURRENT USE OF INSULIN  (HCC) WRITTEN ON 01/20/2018 11:10 AM BY [place], KAWANTA F  Uncontrolled, increase glyburide  to 2.5mg  BID with meals Recheck A1C in a few months

## 2018-01-20 NOTE — Assessment & Plan Note (Signed)
>>  ASSESSMENT AND PLAN FOR ESSENTIAL HYPERTENSION, BENIGN WRITTEN ON 01/20/2018 11:08 AM BY , KAWANTA F  Blood pressure looks okay s/p repeat stroke Will continue to monitor for now No change to medication

## 2018-01-20 NOTE — Assessment & Plan Note (Signed)
Uncontrolled, increase glyburide to 2.5mg  BID with meals Recheck A1C in a few months

## 2018-01-20 NOTE — Assessment & Plan Note (Signed)
Blood pressure looks okay s/p repeat stroke Will continue to monitor for now No change to medication

## 2018-01-20 NOTE — Assessment & Plan Note (Signed)
Residual right hemiparesis symptoms F/u per neurology, on ASA Continue PT Unable to afford speech therapy add thickner as she still chokes with thin liquids

## 2018-01-26 ENCOUNTER — Ambulatory Visit: Payer: BLUE CROSS/BLUE SHIELD | Admitting: Adult Health

## 2018-01-31 ENCOUNTER — Encounter: Payer: Self-pay | Admitting: Physical Medicine & Rehabilitation

## 2018-01-31 ENCOUNTER — Other Ambulatory Visit: Payer: Self-pay

## 2018-01-31 ENCOUNTER — Ambulatory Visit: Payer: BLUE CROSS/BLUE SHIELD | Admitting: Adult Health

## 2018-01-31 ENCOUNTER — Encounter: Payer: Self-pay | Admitting: Adult Health

## 2018-01-31 ENCOUNTER — Ambulatory Visit (HOSPITAL_BASED_OUTPATIENT_CLINIC_OR_DEPARTMENT_OTHER): Payer: BLUE CROSS/BLUE SHIELD | Admitting: Physical Medicine & Rehabilitation

## 2018-01-31 ENCOUNTER — Encounter: Payer: BLUE CROSS/BLUE SHIELD | Attending: Physical Medicine & Rehabilitation

## 2018-01-31 VITALS — BP 140/92 | HR 99 | Ht 62.0 in

## 2018-01-31 VITALS — BP 145/92 | HR 100

## 2018-01-31 DIAGNOSIS — I10 Essential (primary) hypertension: Secondary | ICD-10-CM

## 2018-01-31 DIAGNOSIS — E785 Hyperlipidemia, unspecified: Secondary | ICD-10-CM | POA: Diagnosis not present

## 2018-01-31 DIAGNOSIS — E119 Type 2 diabetes mellitus without complications: Secondary | ICD-10-CM

## 2018-01-31 DIAGNOSIS — I6381 Other cerebral infarction due to occlusion or stenosis of small artery: Secondary | ICD-10-CM | POA: Diagnosis not present

## 2018-01-31 DIAGNOSIS — G8111 Spastic hemiplegia affecting right dominant side: Secondary | ICD-10-CM | POA: Insufficient documentation

## 2018-01-31 DIAGNOSIS — I69351 Hemiplegia and hemiparesis following cerebral infarction affecting right dominant side: Secondary | ICD-10-CM | POA: Diagnosis not present

## 2018-01-31 DIAGNOSIS — Z7982 Long term (current) use of aspirin: Secondary | ICD-10-CM | POA: Diagnosis not present

## 2018-01-31 DIAGNOSIS — Z7984 Long term (current) use of oral hypoglycemic drugs: Secondary | ICD-10-CM | POA: Insufficient documentation

## 2018-01-31 NOTE — Patient Instructions (Addendum)
Continue aspirin 325 mg daily  and crestor  for secondary stroke prevention  Continue to follow up with PCP regarding cholesterol, blood pressure and diabetes management   Continue to participate in physical and speech therapies - participate in occupational when able   Follow up with Dr. Letta Pate for spasticity of right side   Okay to take Tylenol as needed for pain prior to therapies - avoid Advil (ibuprofen), aleve or naproxen   Mind exercises to help with short term memory loss  Consider sleep study in the future to rule out sleep apnea  Continue to monitor blood pressure at home  Maintain strict control of hypertension with blood pressure goal below 130/90, diabetes with hemoglobin A1c goal below 6.5% and cholesterol with LDL cholesterol (bad cholesterol) goal below 70 mg/dL. I also advised the patient to eat a healthy diet with plenty of whole grains, cereals, fruits and vegetables, exercise regularly and maintain ideal body weight.  Followup in the future with me in 3 months or call earlier if needed         Thank you for coming to see [place] at [facility]. I hope we have been able to provide you high quality care today.  You may receive a patient satisfaction survey over the next few weeks. We would appreciate your feedback and comments so that we may continue to improve ourselves and the health of our patients.

## 2018-01-31 NOTE — Progress Notes (Signed)
[facility] [address] [place]. [place] [address] [phone]       OFFICE FOLLOW UP NOTE  [name] Date of Birth:  06/21/1960 Medical Record Number:  [id number]   Reason for Referral:  hospital stroke follow up  CHIEF COMPLAINT:  Chief Complaint  Patient presents with  . Follow-up    Stroke hospital follow up room 9 , pt saw Dr.SEthi in hospital , pt with husband Kelly Gibson    HPI: Kelly Gibson is being seen today for initial visit in the office for lacunar infarcts on 12/05/17. History obtained from patient and chart review. Reviewed all radiology images and labs personally.  Kelly Gibson is a 57 y.o. female with  Prior history of left brain infarct in March 2019 with residual right weakness who presented with worsening of right hemiplegia along with dizziness.  CT head reviewed and showed no acute finding.  MRI scan reviewed and showed 2 new lacunar infarcts in the left periventricular white matter and corona radiata.  CTA head and neck was negative for large vessel occlusion along with being negative for hemodynamically significant stenosis in the neck but did show moderate intracranial stenosis.  2D echo had an EF of 55 to 60% with a positive bubble study. Lower extremity ultrasound negative for DVT. Patient did have a 30-day cardiac monitor performed in April 2019 which is negative for atrial fibrillation but did show sinus bradycardia with multiple pauses and recommended for close cardiology follow-up.  Recommendation for sleep apnea testing as outpatient.  Recommendation for aspirin 325 mg for secondary stroke prevention along with Crestor 40 mg.  Patient was discharged to [facility] for continued right hemiparesis.  Patient is being seen today for hospital follow-up and is accompanied by her husband.  She continues to have right hemiplegia with mild spasticity and dysarthria.  She is being followed by Dr. Providence Lanius and is planning on receiving Botox  treatment today.  Continues to participate in physical therapy and speech therapy (he has not started OT due to financial reasons).  Patient is able to ambulate short distances with assistance but does use wheelchair for long distance.  Continues to take aspirin without side effects of bleeding or bruising.  Continues to take Crestor without side effects myalgias.  Blood pressure mildly elevated at 140/92 and typical SBP 1 35-1 40.  Patient does state that she has been having increased fatigue more so since his stroke and even though it was recommended during inpatient admission to undergo OSA testing, patient is declining at this time.   ROS:   14 system review of systems performed and negative with exception of fatigue, swelling in legs, shortness breath, feeling hot, feeling cold, joint swelling, memory loss, slurred speech, difficulty swallowing, right-sided weakness, decreased energy, change in appetite, just interest in activities, racing thoughts  PMH:  Past Medical History:  Diagnosis Date  . COPD (chronic obstructive pulmonary disease) ([place])   . Depression   . Diabetes mellitus   . Hyperlipidemia   . Hypertension   . Stroke [facility])     PSH:  Past Surgical History:  Procedure Laterality Date  . CESAREAN SECTION     x 3  . TUMOR REMOVAL     left shoulder    Social History:  Social History   Socioeconomic History  . Marital status: Married    Spouse name: Not on file  . Number of children: Not on file  . Years of education: Not on file  . Highest  education level: Not on file  Occupational History  . Not on file  Social Needs  . Financial resource strain: Not on file  . Food insecurity:    Worry: Not on file    Inability: Not on file  . Transportation needs:    Medical: Not on file    Non-medical: Not on file  Tobacco Use  . Smoking status: Former Research scientist (life sciences)  . Smokeless tobacco: Never Used  . Tobacco comment: stopped 2018  Substance and Sexual Activity  . Alcohol  use: No  . Drug use: No  . Sexual activity: Not on file  Lifestyle  . Physical activity:    Days per week: Not on file    Minutes per session: Not on file  . Stress: Not on file  Relationships  . Social connections:    Talks on phone: Not on file    Gets together: Not on file    Attends religious service: Not on file    Active member of club or organization: Not on file    Attends meetings of clubs or organizations: Not on file    Relationship status: Not on file  . Intimate partner violence:    Fear of current or ex partner: Not on file    Emotionally abused: Not on file    Physically abused: Not on file    Forced sexual activity: Not on file  Other Topics Concern  . Not on file  Social History Narrative  . Not on file    Family History:  Family History  Problem Relation Age of Onset  . Diabetes Mother   . Stroke Mother   . Heart disease Father   . Cancer Father   . Stroke Father     Medications:   Current Outpatient Medications on File Prior to Visit  Medication Sig Dispense Refill  . albuterol (PROVENTIL HFA;VENTOLIN HFA) 108 (90 Base) MCG/ACT inhaler Inhale 2 puffs into the lungs every 6 (six) hours as needed for wheezing or shortness of breath. 1 Inhaler 1  . amLODipine (NORVASC) 10 MG tablet Take 1 tablet (10 mg total) by mouth daily. 30 tablet 1  . aspirin EC 325 MG EC tablet Take 1 tablet (325 mg total) by mouth daily. 30 tablet 0  . glyBURIDE (DIABETA) 2.5 MG tablet Take 1 tablet (2.5 mg total) by mouth 2 (two) times daily with a meal. 60 tablet 6  . lisinopril (PRINIVIL,ZESTRIL) 5 MG tablet Take 1 tablet (5 mg total) by mouth daily. 30 tablet 1  . rosuvastatin (CRESTOR) 40 MG tablet Take 1 tablet (40 mg total) by mouth daily at 6 PM. 30 tablet 1  . umeclidinium bromide (INCRUSE ELLIPTA) 62.5 MCG/INH AEPB Inhale 1 puff into the lungs daily. 30 each 6   No current facility-administered medications on file prior to visit.     Allergies:   Allergies    Allergen Reactions  . Niacin And Related Rash     Physical Exam  Vitals:   01/31/18 0836  BP: (!) 140/92  Pulse: 99  Height: 5\' 2"  (1.575 m)   Body mass index is 25.79 kg/m. No exam data present  General: well developed, well nourished, pleasant middle-aged African-American female, seated, in no evident distress Head: head normocephalic and atraumatic.   Neck: supple with no carotid or supraclavicular bruits Cardiovascular: regular rate and rhythm, no murmurs Musculoskeletal: no deformity Skin:  no rash/petichiae Vascular:  Normal pulses all extremities  Neurologic Exam Mental Status: Awake and fully alert. mild  dysarthria.  Iented to place and time. Recent and remote memory intact. Attention span, concentration and fund of knowledge appropriate. Mood and affect appropriate.  Cranial Nerves: Fundoscopic exam reveals sharp disc margins. Pupils equal, briskly reactive to light. Extraocular movements full without nystagmus. Visual fields full to confrontation. Hearing intact. Facial sensation intact.  Mild right facial palsy Motor: Right upper and lower spasticity; RUE & RLE: 1/5; LUE and LLE:\5. Sensory.:  Decreased sensation RUE and RLE Coordination: Rapid alternating movements normal on left upper and lower extremity.  Finger-to-nose and heel-to-shin performed accurately left upper and lower extremities Gait and Station: Patient using wheelchair today and as rolling walker not present at appointment, ambulation was not assessed Reflexes: 1+ and symmetric. Toes downgoing.    NIHSS  9 Modified Rankin  4   Diagnostic Data (Labs, Imaging, Testing)  CT HEAD WO CONTRAST 12/05/2017 IMPRESSION: No acute finding by CT. Extensive old ischemic changes throughout the brain as outlined above.  MR BRAIN WO CONTRAST 12/05/2017 IMPRESSION: 1. Small acute lacunar infarct along the left frontal horn periventricular white matter. Small subacute lacunar infarct in the left posterior  corona radiata. No acute hemorrhage or mass effect. 2. Underlying very advanced chronic small vessel disease redemonstrated. Numerous chronic lacunar infarcts in the deep gray matter nuclei, brainstem, and cerebral white matter including the corpus callosum.  CT ANGIO HEAD W OR WO CONTRAST CT ANGIO NECK W OR WO CONTRAST 12/05/2017 IMPRESSION: 1. Negative for large vessel occlusion. 2. Positive for widespread atherosclerosis in the head, neck, and chest. Abundant soft plaque of the aortic arch and the proximal great vessels. 3. No hemodynamically significant stenosis in the neck, but Moderate intracranial stenoses: - distal Left vertebral artery. - distal 3rd Basilar Artery proximal to the SCA origins. - Right MCA posterior M2 branch origin. 4.  Stable CT appearance of the brain since 1250 hours today. 5. Nonspecific mediastinal lymphadenopathy is partially visible. RECOMMEND routine follow-up Chest CT (IV contrast preferred) further characterize.  ECHOCARDIOGRAM 12/06/2017 Study Conclusions  - Left ventricle: The cavity size was normal. There was moderate   concentric hypertrophy. Systolic function was normal. The   estimated ejection fraction was in the range of 55% to 60%. Wall   motion was normal; there were no regional wall motion   abnormalities. Doppler parameters are consistent with abnormal   left ventricular relaxation (grade 1 diastolic dysfunction). - Aortic valve: There was no regurgitation. - Mitral valve: There was mild regurgitation. - Right ventricle: The cavity size was normal. Wall thickness was   normal. Systolic function was normal. - Right atrium: The atrium was normal in size. - Atrial septum: There was a patent foramen ovale. - Tricuspid valve: There was no regurgitation. - Pulmonary arteries: Systolic pressure could not be accurately   estimated.  Impressions:  - Positive bubble study.    ASSESSMENT: Regenia Erck is a 57 y.o. year old  female here with lacunar stroke on 12/05/17 secondary to small vessel disease. Vascular risk factors include HTN, HLD and DM.     PLAN: -Continue aspirin 325 mg daily  and Crestor for secondary stroke prevention -F/u with PCP regarding your HLD, HTN and DM management -Continue to follow with Dr. Providence Lanius for spasticity of right side -possibly consider baclofen as patient does have complaints of back tightness when attempting to stand to ambulate after sitting in chair for extended period of time -Advised patient okay to take Tylenol as needed for pain prior to therapies but to avoid Advil, Aleve or  naproxen -Mind exercises for complaints of mild short-term memory loss -Possible OSA -patient refusing at this time for sleep study and would like to discuss this again at follow-up visit in 3 months -continue to monitor BP at home -Maintain strict control of hypertension with blood pressure goal below 130/90, diabetes with hemoglobin A1c goal below 6.5% and cholesterol with LDL cholesterol (bad cholesterol) goal below 70 mg/dL. I also advised the patient to eat a healthy diet with plenty of whole grains, cereals, fruits and vegetables, exercise regularly and maintain ideal body weight.  Follow up in 3 months or call earlier if needed   Greater than 50% of time during this 25 minute visit was spent on counseling,explanation of diagnosis of lacunar stroke, reviewing risk factor management of HLD, HTN and DM, planning of further management, discussion with patient and family and coordination of care    Venancio Poisson, [facility] [place]  [facility] [address] [place], [place] [address] she is doing okay  Phone [phone] Fax [phone]

## 2018-01-31 NOTE — Patient Instructions (Signed)

## 2018-01-31 NOTE — Progress Notes (Signed)
Botox Injection for spasticity using needle EMG guidance  Dilution: 50 Units/ml Indication: Severe spasticity which interferes with ADL,mobility and/or  hygiene and is unresponsive to medication management and other conservative care Informed consent was obtained after describing risks and benefits of the procedure with the patient. This includes bleeding, bruising, infection, excessive weakness, or medication side effects. A REMS form is on file and signed. Needle: 27g 1" needle electrode Number of units per muscle  Biceps50 FCR50 FCU25 FDS50 FDP25 FPL25 Palm Long 25 Gastrosoleus50  All injections were done after obtaining appropriate EMG activity and after negative drawback for blood. The patient tolerated the procedure well. Post procedure EMG activty  FDP>FDS     instructions were given. A followup appointment was made.

## 2018-01-31 NOTE — Progress Notes (Signed)
I agree with the above plan 

## 2018-02-01 ENCOUNTER — Telehealth: Payer: Self-pay | Admitting: *Deleted

## 2018-02-01 NOTE — Telephone Encounter (Signed)
[name] contacted Kelly Gibson about the referral to their clinic for PT.  The patient has insurance and would not qualify.

## 2018-02-07 ENCOUNTER — Telehealth: Payer: Self-pay | Admitting: Physical Medicine & Rehabilitation

## 2018-02-07 DIAGNOSIS — I6381 Other cerebral infarction due to occlusion or stenosis of small artery: Secondary | ICD-10-CM

## 2018-02-07 DIAGNOSIS — G8111 Spastic hemiplegia affecting right dominant side: Secondary | ICD-10-CM

## 2018-02-07 NOTE — Telephone Encounter (Signed)
Please put in order for FCE for this ptn per your request on paper form to [facility] at [place] please - the form is in rolodex in ptn name

## 2018-02-07 NOTE — Telephone Encounter (Signed)
Please order FCE at [place]

## 2018-02-08 NOTE — Telephone Encounter (Signed)
Order placed

## 2018-02-15 ENCOUNTER — Encounter: Payer: Self-pay | Admitting: Family Medicine

## 2018-03-07 ENCOUNTER — Telehealth: Payer: Self-pay | Admitting: *Deleted

## 2018-03-07 ENCOUNTER — Other Ambulatory Visit: Payer: Self-pay | Admitting: *Deleted

## 2018-03-07 MED ORDER — AMLODIPINE BESYLATE 10 MG PO TABS: 10.0000 mg | ORAL_TABLET | Freq: Every day | ORAL | 1 refills | Status: DC

## 2018-03-07 NOTE — Telephone Encounter (Signed)
Received call from patient spouse, Nadara Mustard.   Reports that they are in the process of filing for disability for patient D/T CVA and requested letter from PCP stating that patient can no longer work.   MD please advise.

## 2018-03-07 NOTE — Telephone Encounter (Signed)
Call placed to patient and patient spouse made aware.   Will fax letter to office.

## 2018-03-07 NOTE — Telephone Encounter (Signed)
They need to bring me a letter or some kind of document with letter head with regards to who This  letter that she cant work goes to

## 2018-03-09 ENCOUNTER — Encounter: Payer: Self-pay | Admitting: Family Medicine

## 2018-03-09 NOTE — Progress Notes (Signed)
Letter written

## 2018-03-17 ENCOUNTER — Encounter: Payer: BLUE CROSS/BLUE SHIELD | Attending: Physical Medicine & Rehabilitation

## 2018-03-17 ENCOUNTER — Ambulatory Visit: Payer: BLUE CROSS/BLUE SHIELD | Admitting: Physical Medicine & Rehabilitation

## 2018-03-17 DIAGNOSIS — G8111 Spastic hemiplegia affecting right dominant side: Secondary | ICD-10-CM | POA: Insufficient documentation

## 2018-03-17 DIAGNOSIS — Z7984 Long term (current) use of oral hypoglycemic drugs: Secondary | ICD-10-CM | POA: Insufficient documentation

## 2018-03-17 DIAGNOSIS — Z7982 Long term (current) use of aspirin: Secondary | ICD-10-CM | POA: Insufficient documentation

## 2018-03-17 DIAGNOSIS — E119 Type 2 diabetes mellitus without complications: Secondary | ICD-10-CM | POA: Insufficient documentation

## 2018-03-17 DIAGNOSIS — I10 Essential (primary) hypertension: Secondary | ICD-10-CM | POA: Insufficient documentation

## 2018-03-17 DIAGNOSIS — I69351 Hemiplegia and hemiparesis following cerebral infarction affecting right dominant side: Secondary | ICD-10-CM | POA: Insufficient documentation

## 2018-03-21 ENCOUNTER — Ambulatory Visit: Payer: BLUE CROSS/BLUE SHIELD | Admitting: Family Medicine

## 2018-03-22 ENCOUNTER — Encounter: Payer: Self-pay | Admitting: Family Medicine

## 2018-03-23 ENCOUNTER — Encounter: Payer: Self-pay | Admitting: Family Medicine

## 2018-03-23 ENCOUNTER — Other Ambulatory Visit: Payer: Self-pay

## 2018-03-23 ENCOUNTER — Telehealth: Payer: Self-pay | Admitting: *Deleted

## 2018-03-23 ENCOUNTER — Ambulatory Visit (INDEPENDENT_AMBULATORY_CARE_PROVIDER_SITE_OTHER): Payer: BLUE CROSS/BLUE SHIELD | Admitting: Family Medicine

## 2018-03-23 VITALS — BP 138/82 | HR 90 | Temp 98.4°F | Resp 18

## 2018-03-23 DIAGNOSIS — I6300 Cerebral infarction due to thrombosis of unspecified precerebral artery: Secondary | ICD-10-CM

## 2018-03-23 DIAGNOSIS — I10 Essential (primary) hypertension: Secondary | ICD-10-CM

## 2018-03-23 DIAGNOSIS — E785 Hyperlipidemia, unspecified: Secondary | ICD-10-CM | POA: Diagnosis not present

## 2018-03-23 DIAGNOSIS — I69391 Dysphagia following cerebral infarction: Secondary | ICD-10-CM

## 2018-03-23 DIAGNOSIS — E1165 Type 2 diabetes mellitus with hyperglycemia: Secondary | ICD-10-CM | POA: Diagnosis not present

## 2018-03-23 MED ORDER — UMECLIDINIUM BROMIDE 62.5 MCG/INH IN AEPB: 1.0000 | INHALATION_SPRAY | Freq: Every day | RESPIRATORY_TRACT | 6 refills | Status: DC

## 2018-03-23 MED ORDER — AMLODIPINE BESYLATE 10 MG PO TABS: 10.0000 mg | ORAL_TABLET | Freq: Every day | ORAL | 1 refills | Status: DC

## 2018-03-23 MED ORDER — LISINOPRIL 5 MG PO TABS: 5.0000 mg | ORAL_TABLET | Freq: Every day | ORAL | 1 refills | Status: DC

## 2018-03-23 MED ORDER — ROSUVASTATIN CALCIUM 40 MG PO TABS: 40.0000 mg | ORAL_TABLET | Freq: Every day | ORAL | 1 refills | Status: DC

## 2018-03-23 NOTE — Progress Notes (Signed)
Subjective:    Patient ID: Kelly Gibson, female    DOB: 06/16/1960, 57 y.o.   MRN: [id number]  Patient presents for Follow-up (is not fasting) Pt is a 57 y/o AAF, presents for DM, HTN s/p CVA in April 2019 and May 2019.  DM was uncontrolled with multiple med changes with hospitalizations.    Diabetes Mellitus Type II, Follow-up:   Lab Results  Component Value Date   HGBA1C 8.4 (H) 12/06/2017   HGBA1C 12.2 (H) 10/18/2017   HGBA1C 10.2 (H) 05/15/2016    Last seen for diabetes 2 months ago by PCP Management since then includes glyburide 2.5 mg PO BID with meals. She reports good compliance with treatment. She is having side effects.  Current symptoms include none. Home blood sugar records: pt did not bring any records or meter. but states sugars in the am and at different times a day run Blood sugar meds -  95-125. Episodes of hypoglycemia? no   Current Insulin Regimen: none Most Recent Eye Exam: unknown, pt uncontrolled earlier this year after no office visits for roughly 2 years. Weight trend: stable Prior visit with dietician: no Current diet: in general, a "healthy" diet   Current exercise: none, does work with PT 2x a week for rehab  Pertinent Labs:    Component Value Date/Time   CHOL 169 12/06/2017 0459   TRIG 137 12/06/2017 0459   HDL 49 12/06/2017 0459   LDLCALC 93 12/06/2017 0459   CREATININE 1.10 (H) 12/30/2017 0707   CREATININE 1.16 (H) 11/03/2017 1526    Wt Readings from Last 3 Encounters:  01/18/18 141 lb (64 kg)  01/11/18 140 lb 3.2 oz (63.6 kg)  12/29/17 147 lb 11.3 oz (67 kg)    Hypertension, follow-up:  BP Readings from Last 3 Encounters:  03/23/18 138/82  01/31/18 (!) 145/92  01/31/18 (!) 140/92    She was last seen for hypertension 4 months ago in April 2019 with hospital f/up after CVA. BP at that visit was 130/62. Management since that visit includes lisinopril 5 mg and amlodipine 10 mg. She reports good compliance with  treatment. She is not having side effects.  She is not exercising - see above She is not adherent to low salt diet.   Outside blood pressures are not monitored. She is experiencing none.  Patient denies none.   Cardiovascular risk factors include advanced age (older than 26 for men, 72 for women), diabetes mellitus, dyslipidemia and hypertension. CAD and CVA, family hx in mother and father of heart disease and stroke, former smoker Use of agents associated with hypertension: none.    Lipid/Cholesterol, Follow-up:   Last seen for this 4 months ago.  Management since that visit includes crestor 40 mg.  Last Lipid Panel:    Component Value Date/Time   CHOL 169 12/06/2017 0459   TRIG 137 12/06/2017 0459   HDL 49 12/06/2017 0459   CHOLHDL 3.4 12/06/2017 0459   VLDL 27 12/06/2017 0459   LDLCALC 93 12/06/2017 0459   LDLDIRECT 217 (H) 12/13/2015 1653    She reports good compliance with treatment. She is not having side effects.   CVA in April and may 2019 with dysphagia and right hemiparesis - managed by Neurology - still on high dose ASA, no other events, with f/up visit with neurology no med changes, BP goal per their documentation is <130/90.  Has not been doing any thickening of liquids, occasional swallowing/choking  SLP will start in September, Still doing PT  twice a week. Pt and spouse present for visit and having trouble paying for needed services and therapy.  They are still pending disability, have some paperwork pending with PCP which they inquire about today.    Last available labs from hospital admission 12/09/17 - 12/30/17.  Pt not fasting this afternoon, due for labs and A1C.  Pt has no acute complaints except for mild new gradual right LE edema. Cardiology - Dr. Debara Pickett, seen on 01/18/18 Neurology - [facility], seen on 01/31/18 Neurology goal for BP as noted above, DM goal A1C below 6.5%, and cholesterol goal is LDL < 70.  Currently has not been able to  obtain or afford outpt OT or SLP.  Review of Systems  Constitutional: Negative.  Negative for activity change, appetite change, chills, diaphoresis, fatigue, fever and unexpected weight change.  HENT: Negative.   Eyes: Negative.   Respiratory: Negative for cough, chest tightness, shortness of breath and wheezing.   Cardiovascular: Positive for leg swelling (right leg). Negative for chest pain and palpitations.  Gastrointestinal: Negative.   Endocrine: Negative.   Genitourinary: Negative.   Skin: Negative for color change, pallor and rash.  Neurological: Negative for syncope and headaches.  Hematological: Negative.   All other systems reviewed and are negative.        Objective:    BP 138/82   Pulse 90   Temp 98.4 F (36.9 C) (Oral)   Resp 18   LMP 12/10/2012   SpO2 97%   Physical Exam  Constitutional: She is oriented to person, place, and time. She appears well-developed and well-nourished. No distress.  AAF appears older than stated age, seated in WC, NAD  HENT:  Head: Normocephalic and atraumatic.  Nose: Nose normal.  Mouth/Throat: Oropharynx is clear and moist.  Eyes: Pupils are equal, round, and reactive to light. Conjunctivae are normal.  Neck: Normal range of motion. No tracheal deviation present.  Cardiovascular: Normal rate, regular rhythm, normal heart sounds and intact distal pulses. Exam reveals no gallop and no friction rub.  No murmur heard. 1+ right LE edema  Pulmonary/Chest: Effort normal and breath sounds normal. No stridor. No respiratory distress. She has no wheezes. She has no rales. She exhibits no tenderness.  Abdominal: Soft. Bowel sounds are normal. She exhibits no distension and no mass. There is no tenderness. There is no guarding.  Neurological: She is alert and oriented to person, place, and time.  Right sided hemiparesis  Skin: Skin is warm. Capillary refill takes less than 2 seconds. No rash noted. She is not diaphoretic. No erythema. No  pallor.  Psychiatric: She has a normal mood and affect. Her behavior is normal.  Nursing note and vitals reviewed.      Assessment & Plan:     57 y/o female here for F/up on DM, HTN, HLD s/p CVA     ICD-10-CM   1. Uncontrolled type 2 diabetes mellitus with hyperglycemia (HCC) E11.65 Hemoglobin A1c    CBC with Differential/Platelet    COMPLETE METABOLIC PANEL WITH GFR    Lipid panel   Pt did not bring meter with her, or log, states sugars 95-125.  Pt to return for fasting labs, obtain CMP and Hb A1C.  Goal <6.5%  2. Essential hypertension, benign I10 Hemoglobin A1c    CBC with Differential/Platelet    COMPLETE METABOLIC PANEL WITH GFR    Lipid panel   BP near goal today, continue meds and monitor - BP log given check renal function - labs  ordered  3. Hyperlipidemia, unspecified hyperlipidemia type E78.5 Hemoglobin A1c    CBC with Differential/Platelet    COMPLETE METABOLIC PANEL WITH GFR    Lipid panel   continue crestor FLP, return fasting for labs due now  4. Dysphagia, post-stroke I69.391    Continue to monitor, starting SLP therapy, advised pt to f/up in office with any choking, aspiration, cough/SOB explained risk for aspiration pneumonia  5. Cerebrovascular accident (CVA) due to thrombosis of precerebral artery (HCC) I63.00 Hemoglobin A1c    CBC with Differential/Platelet    COMPLETE METABOLIC PANEL WITH GFR    Lipid panel   Neurology managing - on 325 ASA, no SE   Pt appears well given recent circumstances, she continues to have financial barriers to needed outpt services, but husband states they are soon going to start SLP with twice weekly PT.  Has some disability paperwork pending.  Has some med refills today which were ordered.   I have discussed fasting lab work which is due after hospitalization, pt and husband seem to have some concerns about when they can return with their other appts, however I have encouraged them to come in the am ASAP to get labs done.     Delsa Grana, PA-C 03/23/18

## 2018-03-23 NOTE — Telephone Encounter (Signed)
Received request from pharmacy for [place] [facility] [address].   PA submitted.   Dx: J44.9- COPD.   Received immediate approval.   PA Case [id number] Status: Approved.   Pharmacy made aware.

## 2018-03-23 NOTE — Patient Instructions (Signed)
Want blood pressure to be under 130/90 - per neurology notes  Call us if over that on average.  Return to get fasting labs done

## 2018-03-29 ENCOUNTER — Encounter: Payer: Self-pay | Admitting: Family Medicine

## 2018-03-30 ENCOUNTER — Other Ambulatory Visit: Payer: BLUE CROSS/BLUE SHIELD

## 2018-03-30 DIAGNOSIS — E785 Hyperlipidemia, unspecified: Secondary | ICD-10-CM | POA: Diagnosis not present

## 2018-03-30 DIAGNOSIS — I6300 Cerebral infarction due to thrombosis of unspecified precerebral artery: Secondary | ICD-10-CM

## 2018-03-30 DIAGNOSIS — I10 Essential (primary) hypertension: Secondary | ICD-10-CM | POA: Diagnosis not present

## 2018-03-30 DIAGNOSIS — E1165 Type 2 diabetes mellitus with hyperglycemia: Secondary | ICD-10-CM

## 2018-03-31 LAB — COMPLETE METABOLIC PANEL WITH GFR
AG RATIO: 1.6 (calc) (ref 1.0–2.5)
ALBUMIN MSPROF: 4.5 g/dL (ref 3.6–5.1)
ALKALINE PHOSPHATASE (APISO): 78 U/L (ref 33–130)
ALT: 11 U/L (ref 6–29)
AST: 18 U/L (ref 10–35)
BILIRUBIN TOTAL: 0.3 mg/dL (ref 0.2–1.2)
BUN: 15 mg/dL (ref 7–25)
CHLORIDE: 104 mmol/L (ref 98–110)
CO2: 25 mmol/L (ref 20–32)
Calcium: 9.8 mg/dL (ref 8.6–10.4)
Creat: 0.86 mg/dL (ref 0.50–1.05)
GFR, Est African American: 88 mL/min/{1.73_m2} (ref >=60)
GFR, Est Non African American: 76 mL/min/{1.73_m2} (ref >=60)
GLUCOSE: 101 mg/dL — AB (ref 65–99)
Globulin: 2.8 g/dL (calc) (ref 1.9–3.7)
POTASSIUM: 3.9 mmol/L (ref 3.5–5.3)
Sodium: 141 mmol/L (ref 135–146)
Total Protein: 7.3 g/dL (ref 6.1–8.1)

## 2018-03-31 LAB — LIPID PANEL
Cholesterol: 170 mg/dL (ref <200)
HDL: 59 mg/dL (ref >50)
LDL CHOLESTEROL (CALC): 86 mg/dL
NON-HDL CHOLESTEROL (CALC): 111 mg/dL (ref <130)
Total CHOL/HDL Ratio: 2.9 (calc) (ref <5.0)
Triglycerides: 150 mg/dL — ABNORMAL HIGH (ref <150)

## 2018-03-31 LAB — CBC WITH DIFFERENTIAL/PLATELET
Basophils Absolute: 61 cells/uL (ref 0–200)
Basophils Relative: 1.6 %
EOS PCT: 4.3 %
Eosinophils Absolute: 163 cells/uL (ref 15–500)
HEMATOCRIT: 39.3 % (ref 35.0–45.0)
Hemoglobin: 13 g/dL (ref 11.7–15.5)
LYMPHS ABS: 1676 {cells}/uL (ref 850–3900)
MCH: 28.2 pg (ref 27.0–33.0)
MCHC: 33.1 g/dL (ref 32.0–36.0)
MCV: 85.2 fL (ref 80.0–100.0)
MPV: 10.7 fL (ref 7.5–12.5)
Monocytes Relative: 10.1 %
NEUTROS PCT: 39.9 %
Neutro Abs: 1516 cells/uL (ref 1500–7800)
Platelets: 342 10*3/uL (ref 140–400)
RBC: 4.61 10*6/uL (ref 3.80–5.10)
RDW: 14.9 % (ref 11.0–15.0)
Total Lymphocyte: 44.1 %
WBC mixed population: 384 cells/uL (ref 200–950)
WBC: 3.8 10*3/uL (ref 3.8–10.8)

## 2018-03-31 LAB — HEMOGLOBIN A1C
EAG (MMOL/L): 7.9 (calc)
Hgb A1c MFr Bld: 6.6 % of total Hgb — ABNORMAL HIGH (ref <5.7)
MEAN PLASMA GLUCOSE: 143 (calc)

## 2018-04-04 ENCOUNTER — Other Ambulatory Visit: Payer: Self-pay | Admitting: *Deleted

## 2018-04-04 MED ORDER — LISINOPRIL 5 MG PO TABS: 5.0000 mg | ORAL_TABLET | Freq: Every day | ORAL | 1 refills | Status: DC

## 2018-04-05 ENCOUNTER — Other Ambulatory Visit: Payer: Self-pay | Admitting: *Deleted

## 2018-04-05 MED ORDER — LISINOPRIL 5 MG PO TABS: 5.0000 mg | ORAL_TABLET | Freq: Every day | ORAL | 1 refills | Status: DC

## 2018-04-05 MED ORDER — ROSUVASTATIN CALCIUM 40 MG PO TABS: 40.0000 mg | ORAL_TABLET | Freq: Every day | ORAL | 1 refills | Status: DC

## 2018-04-18 ENCOUNTER — Emergency Department (HOSPITAL_COMMUNITY): Payer: BLUE CROSS/BLUE SHIELD

## 2018-04-18 ENCOUNTER — Emergency Department (HOSPITAL_COMMUNITY)
Admission: EM | Admit: 2018-04-18 | Discharge: 2018-04-18 | Disposition: A | Discharge status: Home / Self Care | Payer: BLUE CROSS/BLUE SHIELD | Attending: Emergency Medicine | Admitting: Emergency Medicine

## 2018-04-18 ENCOUNTER — Other Ambulatory Visit: Payer: Self-pay

## 2018-04-18 ENCOUNTER — Encounter (HOSPITAL_COMMUNITY): Payer: Self-pay | Admitting: *Deleted

## 2018-04-18 DIAGNOSIS — Z7982 Long term (current) use of aspirin: Secondary | ICD-10-CM | POA: Insufficient documentation

## 2018-04-18 DIAGNOSIS — S52601A Unspecified fracture of lower end of right ulna, initial encounter for closed fracture: Secondary | ICD-10-CM | POA: Diagnosis not present

## 2018-04-18 DIAGNOSIS — Y92002 Bathroom of unspecified non-institutional (private) residence single-family (private) house as the place of occurrence of the external cause: Secondary | ICD-10-CM | POA: Insufficient documentation

## 2018-04-18 DIAGNOSIS — Z87891 Personal history of nicotine dependence: Secondary | ICD-10-CM | POA: Insufficient documentation

## 2018-04-18 DIAGNOSIS — S6991XA Unspecified injury of right wrist, hand and finger(s), initial encounter: Secondary | ICD-10-CM | POA: Diagnosis present

## 2018-04-18 DIAGNOSIS — Y939 Activity, unspecified: Secondary | ICD-10-CM | POA: Insufficient documentation

## 2018-04-18 DIAGNOSIS — E119 Type 2 diabetes mellitus without complications: Secondary | ICD-10-CM | POA: Diagnosis not present

## 2018-04-18 DIAGNOSIS — Y998 Other external cause status: Secondary | ICD-10-CM | POA: Diagnosis not present

## 2018-04-18 DIAGNOSIS — W010XXA Fall on same level from slipping, tripping and stumbling without subsequent striking against object, initial encounter: Secondary | ICD-10-CM | POA: Insufficient documentation

## 2018-04-18 DIAGNOSIS — Z79899 Other long term (current) drug therapy: Secondary | ICD-10-CM | POA: Diagnosis not present

## 2018-04-18 DIAGNOSIS — Z8673 Personal history of transient ischemic attack (TIA), and cerebral infarction without residual deficits: Secondary | ICD-10-CM | POA: Insufficient documentation

## 2018-04-18 DIAGNOSIS — I10 Essential (primary) hypertension: Secondary | ICD-10-CM | POA: Diagnosis not present

## 2018-04-18 DIAGNOSIS — S52501A Unspecified fracture of the lower end of right radius, initial encounter for closed fracture: Secondary | ICD-10-CM | POA: Diagnosis not present

## 2018-04-18 DIAGNOSIS — J449 Chronic obstructive pulmonary disease, unspecified: Secondary | ICD-10-CM | POA: Insufficient documentation

## 2018-04-18 MED ORDER — LIDOCAINE HCL 2 % IJ SOLN
10.0000 mL | Freq: Once | INTRAMUSCULAR | Status: DC
  Filled 2018-04-18: qty 20

## 2018-04-18 MED ORDER — HYDROCODONE-ACETAMINOPHEN 5-325 MG PO TABS: 1.0000 | ORAL_TABLET | Freq: Four times a day (QID) | ORAL | 0 refills | Status: DC | PRN

## 2018-04-18 MED ORDER — KETOROLAC TROMETHAMINE 30 MG/ML IJ SOLN
30.0000 mg | Freq: Once | INTRAMUSCULAR | Status: AC
  Administered 2018-04-18: 30 mg via INTRAVENOUS
  Filled 2018-04-18: qty 1

## 2018-04-18 MED ORDER — FENTANYL CITRATE (PF) 100 MCG/2ML IJ SOLN
50.0000 ug | Freq: Once | INTRAMUSCULAR | Status: AC
  Administered 2018-04-18: 50 ug via INTRAVENOUS
  Filled 2018-04-18: qty 2

## 2018-04-18 NOTE — ED Notes (Signed)
Taken to xray.

## 2018-04-18 NOTE — ED Provider Notes (Signed)
[place] EMERGENCY DEPARTMENT Provider Note   CSN: [id number] Arrival date & time: 04/18/18  0400     History   Chief Complaint Chief Complaint  Patient presents with  . Arm Injury    HPI Kelly Gibson is a 57 y.o. female with history of COPD, depression, diabetes mellitus, HLD, HTN, CVA in May 2019 with residual right-sided deficits presents for evaluation of acute onset, intermittent right wrist pain secondary to fall this morning.  She states that at around 4 AM she woke up and attempted to ambulate to the bathroom when due to the residual right-sided weakness she fell forward landing on her right side.  She denies head injury or loss of consciousness.  Denies headaches neck pain or back pain.  She notes intermittent sharp pains to the right wrist radiating up the arm, worsens with palpation.  Denies any prodrome leading up to the fall such as lightheadedness, shortness of breath, or chest pain.  Notes baseline decreased sensation to the right upper and lower extremities since the stroke which she states is no worse than usual.  She did take some Advil without relief of her symptoms.  The history is provided by the patient.    Past Medical History:  Diagnosis Date  . COPD (chronic obstructive pulmonary disease) ([place])   . Depression   . Diabetes mellitus   . Hyperlipidemia   . Hypertension   . Stroke [facility])     Patient Active Problem List   Diagnosis Date Noted  . Poorly controlled type 2 diabetes mellitus with peripheral neuropathy ([place])   . Labile blood glucose   . AKI (acute kidney injury) ([place])   . Hypokalemia   . Dysphagia, post-stroke   . Hemiparesis affecting right side as late effect of cerebrovascular accident (CVA) ([place])   . Dysarthria, post-stroke   . Gait disturbance, post-stroke   . Acute CVA (cerebrovascular accident) ([place]) 12/05/2017  . Bradycardia   . Weakness 10/26/2017  . Cerebrovascular disease 10/18/2017  . COPD (chronic  obstructive pulmonary disease) ([place]) 02/18/2015  . Loss of weight 03/15/2014  . Non compliance w medication regimen 06/17/2013  . Post-menopause bleeding 06/16/2013  . Bronchitis 05/04/2012  . Tobacco user 05/04/2012  . Ganglion cyst 03/09/2012  . Anxiety 03/09/2012  . Essential hypertension, benign 12/17/2011  . Obesity, unspecified 12/17/2011  . Hyperlipidemia 12/17/2011  . Depression 12/17/2011  . Diabetes mellitus type II, uncontrolled ([place]) 12/14/2011    Past Surgical History:  Procedure Laterality Date  . CESAREAN SECTION     x 3  . TUMOR REMOVAL     left shoulder     OB History   None      Home Medications    Prior to Admission medications   Medication Sig Start Date End Date Taking? Authorizing Provider  albuterol (PROVENTIL HFA;VENTOLIN HFA) 108 (90 Base) MCG/ACT inhaler Inhale 2 puffs into the lungs every 6 (six) hours as needed for wheezing or shortness of breath. 10/18/17  Yes Tye, Modena Nunnery, MD  amLODipine (NORVASC) 10 MG tablet Take 1 tablet (10 mg total) by mouth daily. 03/23/18  Yes Delsa Grana, PA-C  aspirin EC 325 MG EC tablet Take 1 tablet (325 mg total) by mouth daily. 12/10/17  Yes Eulogio Bear U, DO  glyBURIDE (DIABETA) 2.5 MG tablet Take 1 tablet (2.5 mg total) by mouth 2 (two) times daily with a meal. 01/19/18  Yes [place], Modena Nunnery, MD  lisinopril (PRINIVIL,ZESTRIL) 5 MG tablet Take 1 tablet (5 mg  total) by mouth daily. 04/05/18  Yes [place], Modena Nunnery, MD  rosuvastatin (CRESTOR) 40 MG tablet Take 1 tablet (40 mg total) by mouth daily at 6 PM. 04/05/18  Yes [place], Modena Nunnery, MD  umeclidinium bromide (INCRUSE ELLIPTA) 62.5 MCG/INH AEPB Inhale 1 puff into the lungs daily. 03/23/18  Yes [place], Modena Nunnery, MD  HYDROcodone-acetaminophen (NORCO/VICODIN) 5-325 MG tablet Take 1 tablet by mouth every 6 (six) hours as needed for severe pain. 04/18/18   Renita Papa, PA-C    Family History Family History  Problem Relation Age of Onset  . Diabetes Mother   .  Stroke Mother   . Heart disease Father   . Cancer Father   . Stroke Father     Social History Social History   Tobacco Use  . Smoking status: Former Research scientist (life sciences)  . Smokeless tobacco: Never Used  . Tobacco comment: stopped 2018  Substance Use Topics  . Alcohol use: No  . Drug use: No     Allergies   Niacin and related   Review of Systems Review of Systems  Eyes: Negative for photophobia.  Respiratory: Negative for shortness of breath.   Cardiovascular: Negative for chest pain.  Musculoskeletal: Positive for arthralgias.  Neurological: Positive for weakness (Chronic since CVA, unchanged) and numbness (Chronic since CVA, unchanged). Negative for syncope and headaches.  All other systems reviewed and are negative.    Physical Exam Updated Vital Signs BP 115/76 (BP Location: Left Arm)   Pulse 83   Temp 97.7 F (36.5 C) (Oral)   Resp 18   Ht 5\' 3"  (1.6 m)   Wt 63.5 kg   LMP 12/10/2012   SpO2 100%   BMI 24.80 kg/m   Physical Exam  Constitutional: She appears well-developed and well-nourished. No distress.  HENT:  Head: Normocephalic and atraumatic.  No Battle's signs, no raccoon's eyes, no rhinorrhea. No hemotympanum. No tenderness to palpation of the face or skull. No deformity, crepitus, or swelling noted.   Eyes: Conjunctivae are normal. Right eye exhibits no discharge. Left eye exhibits no discharge.  Neck: No JVD present. No tracheal deviation present.  Cardiovascular: Normal rate and intact distal pulses.  2+ radial pulses bilaterally  Pulmonary/Chest: Effort normal.  Abdominal: She exhibits no distension.  Musculoskeletal: She exhibits tenderness and deformity. She exhibits no edema.  Obvious deformity to the right wrist.  Tender to palpation circumferentially.  Unable to assess strength secondary to weakness status post CVA in May.  Patient states weakness is chronic and unchanged.  Neurological: She is alert.  Fluent speech with no evidence of dysarthria or  aphasia.  Decreased tone noted to the right upper and lower extremities.  Unable to assess strength secondary to weakness status post CVA.  Decreased sensation to soft touch of the right upper and lower extremities.  Patient states this is chronic and unchanged since her stroke.  Skin: Skin is warm and dry. Capillary refill takes less than 2 seconds. No erythema.  Psychiatric: She has a normal mood and affect. Her behavior is normal.  Nursing note and vitals reviewed.    ED Treatments / Results  Labs (all labs ordered are listed, but only abnormal results are displayed) Labs Reviewed - No data to display  EKG None  Radiology Dg Forearm Right  Result Date: 04/18/2018 CLINICAL DATA:  57 year old female with fall and right wrist pain. EXAM: RIGHT WRIST - COMPLETE 3+ VIEW; RIGHT FOREARM - 2 VIEW COMPARISON:  None. FINDINGS: There is a comminuted fracture of  the distal radial metadiaphysis with mild volar angulation of the distal fracture fragment. There is a mildly displaced and volarly angulated transverse fracture of the distal ulna. There is dorsal dislocation of the distal ulna in relation to the radius and carpus. The bones are osteopenic. The soft tissue swelling of the wrist. IMPRESSION: Angulated fractures of the distal radius and ulna with dorsal dislocation of the distal ulna. Electronically Signed   By: Anner Crete M.D.   On: 04/18/2018 05:14   Dg Wrist Complete Right  Result Date: 04/18/2018 CLINICAL DATA:  The pt has had a stroke and she lost her balance she fell onto her rt forearm. Post reduction films. EXAM: RIGHT WRIST - COMPLETE 3+ VIEW COMPARISON:  Earlier today at 0459 hours FINDINGS: Placement of overlying cast, obscuring bony detail. Both-bone distal forearm fractures again identified. Alignment may be mildly improved in the sagittal plane. No new injury identified. IMPRESSION: Interval reduction of both-bone distal forearm fractures. Electronically Signed   By: Abigail Miyamoto M.D.   On: 04/18/2018 07:48   Dg Wrist Complete Right  Result Date: 04/18/2018 CLINICAL DATA:  57 year old female with fall and right wrist pain. EXAM: RIGHT WRIST - COMPLETE 3+ VIEW; RIGHT FOREARM - 2 VIEW COMPARISON:  None. FINDINGS: There is a comminuted fracture of the distal radial metadiaphysis with mild volar angulation of the distal fracture fragment. There is a mildly displaced and volarly angulated transverse fracture of the distal ulna. There is dorsal dislocation of the distal ulna in relation to the radius and carpus. The bones are osteopenic. The soft tissue swelling of the wrist. IMPRESSION: Angulated fractures of the distal radius and ulna with dorsal dislocation of the distal ulna. Electronically Signed   By: Anner Crete M.D.   On: 04/18/2018 05:14    Procedures Procedures (including critical care time)  Medications Ordered in ED Medications  fentaNYL (SUBLIMAZE) injection 50 mcg (50 mcg Intravenous Given 04/18/18 0650)  ketorolac (TORADOL) 30 MG/ML injection 30 mg (30 mg Intravenous Given 04/18/18 0636)     Initial Impression / Assessment and Plan / ED Course  I have reviewed the triage vital signs and the nursing notes.  Pertinent labs & imaging results that were available during my care of the patient were reviewed by me and considered in my medical decision making (see chart for details).     Patient presents with right wrist pain secondary to mechanical fall this morning.  She is wheelchair-bound secondary to CVA in May but attempted to ambulate to the bathroom on her own without assistance this morning.  She is afebrile, vital signs are stable.  No signs of closed head injury.  No midline spine tenderness. Compartments are soft. She is nontoxic in appearance.  Neuro exam is at her baseline, intact peripheral pulses noted on exam with brisk capillary refill.  Radiographs show a comminuted angulated fractures of the distal radius and ulna with dorsal dislocation  of the distal ulna.  Reduction was attempted in the presence of my attending Dr. Randal Buba and postreduction films show improvement in the angulation of the fractures. 8:01 AM Spoke with Silvestre Gunner with orthopedic surgery who has reviewed films and recommends follow-up with orthopedics on an outpatient basis.  On reevaluation, the patient is resting comfortably no apparent distress.  She states her pain is well controlled while wearing the splint.  Recommend follow-up with orthopedics on outpatient basis.  Discussed strict ED return precautions.  Patient and patient's husband verbalized understanding of and agreement with  plan and patient is stable for discharge home at this time.  Final Clinical Impressions(s) / ED Diagnoses   Final diagnoses:  Closed fracture of distal ends of right radius and ulna, initial encounter    ED Discharge Orders         Ordered    HYDROcodone-acetaminophen (NORCO/VICODIN) 5-325 MG tablet  Every 6 hours PRN     04/18/18 0820           Renita Papa, PA-C 04/18/18 1119    Palumbo, April, MD 04/21/18 0009

## 2018-04-18 NOTE — ED Notes (Signed)
DC instructions discussed and understanding verbalized with pt andf husband. Home stable with written instructions and script.via wc.

## 2018-04-18 NOTE — Discharge Instructions (Signed)
1. Medications: Alternate 400 mg of ibuprofen and [id number] mg of Tylenol every 3 hours as needed for pain. Do not exceed 4000 mg of Tylenol daily.  Take ibuprofen with food to avoid upset stomach issues.  You can take hydrocodone as needed for severe pain but do not drive, drink alcohol, operate heavy machinery, or make important decisions while taking this medicine as it may make you drowsy.  You can also cut these tablets in half. 2. Treatment: rest, ice, elevate, drink plenty of fluids.  Keep the splint clean and dry. 3. Follow Up: Please followup with orthopedics as directed within 1 to 2 weeks; Please return to the ER for worsening symptoms or other concerns such as worsening swelling, redness of the skin, fevers, loss of pulses, or loss of feeling

## 2018-04-18 NOTE — ED Triage Notes (Signed)
The pt has had a stroke and she lost her balance she fell onto her rt forearm  Good radial pulse.  Sl deformity

## 2018-04-18 NOTE — ED Notes (Signed)
Pt unable to sign due to computer . Pt and husband verbalize understnding.

## 2018-04-18 NOTE — ED Notes (Signed)
Pt sitting in wheel chair after return from xray. Husband at side. Offered to return to stretcher but prefers to stay in chair. States no pain at present.

## 2018-04-18 NOTE — Progress Notes (Signed)
Orthopedic Tech Progress Note Patient Details:  Kelly Gibson 01/31/1961 [id number]  Ortho Devices Type of Ortho Device: Arm sling, Sugartong splint Ortho Device/Splint Location: rue Ortho Device/Splint Interventions: Ordered, Application, Adjustment   Post Interventions Patient Tolerated: Well Instructions Provided: Care of device, Adjustment of device   Karolee Stamps 04/18/2018, 7:08 AM

## 2018-04-20 ENCOUNTER — Ambulatory Visit: Payer: BLUE CROSS/BLUE SHIELD | Admitting: Internal Medicine

## 2018-04-22 DIAGNOSIS — S52501A Unspecified fracture of the lower end of right radius, initial encounter for closed fracture: Secondary | ICD-10-CM | POA: Diagnosis not present

## 2018-04-28 DIAGNOSIS — S52501D Unspecified fracture of the lower end of right radius, subsequent encounter for closed fracture with routine healing: Secondary | ICD-10-CM | POA: Diagnosis not present

## 2018-05-03 ENCOUNTER — Encounter (HOSPITAL_COMMUNITY): Payer: Self-pay | Admitting: *Deleted

## 2018-05-03 ENCOUNTER — Ambulatory Visit: Payer: BLUE CROSS/BLUE SHIELD | Admitting: Adult Health

## 2018-05-03 ENCOUNTER — Telehealth: Payer: Self-pay

## 2018-05-03 DIAGNOSIS — S52501D Unspecified fracture of the lower end of right radius, subsequent encounter for closed fracture with routine healing: Secondary | ICD-10-CM | POA: Diagnosis not present

## 2018-05-03 NOTE — Progress Notes (Signed)
Patient denies chest pain or shortness of breath. States she is forgetful. Request I contact husband and provide preop instructions to him. Attempted to contact will recontact him later. Cardiologist note in [place].

## 2018-05-03 NOTE — Telephone Encounter (Signed)
   [facility] Pre-operative Risk Assessment    Request for surgical clearance:  1. What type of surgery is being performed?  ORIF Right wrist   2. When is this surgery scheduled? 05/04/2018   3. What type of clearance is required (medical clearance vs. Pharmacy clearance to hold med vs. Both)? both  4. Are there any medications that need to be held prior to surgery and how long? Not listed  5. Practice name and name of physician performing surgery? [facility], Dr. Adele Dan   6. What is your office phone number [phone]   7.   What is your office fax number [phone] attn Kelly  8.   Anesthesia type (None, local, MAC, general) ? Not listed    Kelly Gibson 05/03/2018, 4:16 PM  _________________________________________________________________   (provider comments below)

## 2018-05-03 NOTE — Progress Notes (Signed)
Anesthesia Chart Review: SAME DAY WORK-UP  Case:  [id number] Date/Time:  05/04/18 1115   Procedure:  OPEN REDUCTION INTERNAL FIXATION (ORIF) RIGHT  WRIST FRACTURE (Right )   Anesthesia type:  Choice   Pre-op diagnosis:  UNSPECIFIED FRACTURE OF THE LOWER END OF RIGHT RADIUS   Location:  [facility] OR ROOM 06 / [place] OR   Surgeon:  Hiram Gash, MD      DISCUSSION: Patient is a 57 year old female scheduled for the above procedure. History includes DM2, COPD, HTN, HLD, CVA (with right sided weakness 3//2019, 11/2017), bradycardia.  - ED visit 04/18/18 for fall while attempting to walk to bathroom without assistance (has right hemiparesis due to CVA). She sustained a angulated fractures of the distal and ulna with dorsal dislocation and the distal ulna that was reduced and Sugartong split placed in the ED with out-patient Ortho follow-up arranged. - Admission 12/05/17-12/09/17 (then [facility] fro 12/09/17-12/29/17) for small acute left lacunar infarct. Echo showed PFO. CTA showed no significant stenosis in the neck, but moderate intracranial stenosis. EP cardiology consulted as her Event monitor showed a 3.4 second pause with junctional rhythm. There were no associated symptoms. EP cardiologist Thompson Grayer, MD saw patient on 12/09/17 and recommended avoiding AV nodal blocking agent, but at that point would not plan on PPM unless she developed symptoms such as syncope/presyncope. He recommended continued event monitor with out-patient follow-up with Dr. Debara Pickett and out-patient sleep study once improved from CVA. One year follow-up chest CT recommended for RUL lung nodule recommended. She was discharged to [facility]. - Admission 10/26/17-10/27/17 for for weakness in the setting of recent CVA. Neurology felt likely recrudescence of previous stroke symptoms. Gastroenteritis, hypoglycemia (with recent Toujeo increase), and bradycardia were also possible contributing factors. Neurology did not recommend further work-up from their standpoint  given "negative" MRI. Cardiology was consulted to see patient during admission (see 10/27/17 note by Kathyrn Drown, NP/Hilty, Chrissie Noa, MD) for bradycardia (not on AV nodal blocking medications), so 30 day event monitor ordered. - Admission 10/06/17 to [facility] for TIA/CVA with right sided weakness (while on vacation). Had similar episode about a week prior. According to PCP notes, CVA work-up initiated but patient left AMA. (She saw her PCP 10/18/17.)  Hypertension and DM better controlled since 09/2017. No plans for PPM per 01/18/18 cardiology follow-up note. Could consider intraoperative pacing pads given her history. It appears that surgeon is also reaching out to cardiology for preoperative input. Patient is a same day work-up, so further evaluation by her assigned anesthesiologist on the day of surgery.    VS: LMP 12/10/2012 . Vitals for the day of surgery.  BP Readings from Last 3 Encounters:  04/18/18 115/76  03/23/18 138/82  01/31/18 (!) 145/92    PROVIDERS: Alycia Rossetti, MD is PCP. Lyman Bishop, MD is primary cardiologist. Last visit with Almyra Deforest, [place] on 01/18/18. No current plan for PPM at that time, but with underlying conduction disease, it may progress in the future and may ultimately require a PPM.  - Rowe Robert, MD is EP cardiologist. (Saw patient 11/2017; no PPM recommended at that time. Continue primary cardiology follow-up.) - [facility] is neurology follow-up Antony Contras, MD or Rosalin Hawking, MD).   LABS: She will need updated labs prior to surgery. A1c 6.6 on 03/30/18 (down from 12.2 10/18/17 and 8.4 12/06/17).    IMAGES: CT chest 12/08/17: IMPRESSION: - Nonspecific mildly enlarged mediastinal and BILATERAL hilar lymph nodes; these could  be seen as a reactive process, metastatic disease, lymphoma, and Castleman disease. - The lack of nodal calcifications makes sarcoidosis less likely. - Moderate bronchitic changes  with subsegmental atelectasis LEFT lower lobe. - Extensive coronary arterial calcifications and minimal pericardial effusion. - 5 mm RIGHT upper lobe nodule, recommendation below. - No follow-up needed if patient is low-risk. Non-contrast chest CT can be considered in 12 months if patient is high-risk. This recommendation follows the consensus statement: Guidelines for Management of Incidental Pulmonary Nodules Detected on CT Images: From the Fleischner Society 2017; Radiology 2017; 284:228-243. - Aortic Atherosclerosis (ICD10-I70.0). (Per 12/09/17 Discharge Summary: Repeat Chest CT in 1 year recommended.)  CTA head/neck 12/05/17: IMPRESSION: 1. Negative for large vessel occlusion. 2. Positive for widespread atherosclerosis in the head, neck, and chest. Abundant soft plaque of the aortic arch and the proximal great vessels. 3. No hemodynamically significant stenosis in the neck, but Moderate intracranial stenoses: - distal Left vertebral artery. - distal 3rd Basilar Artery proximal to the SCA origins. - Right MCA posterior M2 branch origin. 4.  Stable CT appearance of the brain since 1250 hours today. 5. Nonspecific mediastinal lymphadenopathy is partially visible. RECOMMEND routine follow-up Chest CT (IV contrast preferred) further characterize.  MRI brain 12/05/17: IMPRESSION: 1. Small acute lacunar infarct along the left frontal horn periventricular white matter. Small subacute lacunar infarct in the left posterior corona radiata. No acute hemorrhage or mass effect. 2. Underlying very advanced chronic small vessel disease redemonstrated. Numerous chronic lacunar infarcts in the deep gray matter nuclei, brainstem, and cerebral white matter including the corpus callosum.   EKG: 12/05/17: SR with PACs. Inferior infarct (age undetermined). Anterior infarct (age undetermined). T wave abnormality, consider lateral ischemia. No significant change since last tracing.   CV: Echo  12/06/17: Study Conclusions - Left ventricle: The cavity size was normal. There was moderate   concentric hypertrophy. Systolic function was normal. The   estimated ejection fraction was in the range of 55% to 60%. Wall   motion was normal; there were no regional wall motion   abnormalities. Doppler parameters are consistent with abnormal   left ventricular relaxation (grade 1 diastolic dysfunction). - Aortic valve: There was no regurgitation. - Mitral valve: There was mild regurgitation. - Right ventricle: The cavity size was normal. Wall thickness was   normal. Systolic function was normal. - Right atrium: The atrium was normal in size. - Atrial septum: There was a patent foramen ovale. - Tricuspid valve: There was no regurgitation. - Pulmonary arteries: Systolic pressure could not be accurately   estimated. Impressions: - Positive bubble study.  Event monitor 11/04/17-12/03/17:  PVC's and sinus pauses - up to 3.5 seconds noted. Have discussed with cardiac EP - patient is asymptomatic and pauses are nocturnal. Pacer not recommended.    Past Medical History:  Diagnosis Date  . Bradycardia    SR/SB with up to 3.5 second pause (asymptomatic) on ~ 10/2017 event monitor; saw EP Dr. Rayann Heman: avoid AV nodal blocking agents  . COPD (chronic obstructive pulmonary disease) ([place])   . Depression   . Diabetes mellitus   . Hyperlipidemia   . Hypertension   . PONV (postoperative nausea and vomiting)   . Stroke [facility])     Past Surgical History:  Procedure Laterality Date  . CESAREAN SECTION     x 3  . TUMOR REMOVAL     left shoulder    MEDICATIONS: No current facility-administered medications for this encounter.    Marland Kitchen albuterol (PROVENTIL HFA;VENTOLIN HFA) 108 (  90 Base) MCG/ACT inhaler  . amLODipine (NORVASC) 10 MG tablet  . aspirin EC 325 MG EC tablet  . glyBURIDE (DIABETA) 2.5 MG tablet  . HYDROcodone-acetaminophen (NORCO/VICODIN) 5-325 MG tablet  . lisinopril (PRINIVIL,ZESTRIL) 5  MG tablet  . rosuvastatin (CRESTOR) 40 MG tablet  . umeclidinium bromide (INCRUSE ELLIPTA) 62.5 MCG/INH AEPB    [name] Short Stay Center/Anesthesiology Phone [phone] 05/03/2018 4:57 PM

## 2018-05-03 NOTE — Telephone Encounter (Signed)
° ° °  Please call Judeen Hammans on backline after 5pm; call [phone]

## 2018-05-04 ENCOUNTER — Encounter (HOSPITAL_COMMUNITY): Payer: Self-pay | Admitting: Certified Registered Nurse Anesthetist

## 2018-05-04 ENCOUNTER — Ambulatory Visit (HOSPITAL_COMMUNITY)
Admission: RE | Admit: 2018-05-04 | Discharge: 2018-05-04 | Disposition: A | Discharge status: Home / Self Care | Payer: BLUE CROSS/BLUE SHIELD | Source: Ambulatory Visit | Attending: Orthopaedic Surgery | Admitting: Orthopaedic Surgery

## 2018-05-04 ENCOUNTER — Telehealth: Payer: Self-pay

## 2018-05-04 ENCOUNTER — Ambulatory Visit (HOSPITAL_COMMUNITY): Payer: BLUE CROSS/BLUE SHIELD | Admitting: Vascular Surgery

## 2018-05-04 ENCOUNTER — Encounter (HOSPITAL_COMMUNITY)
Admission: RE | Disposition: A | Discharge status: Home / Self Care | Payer: Self-pay | Source: Ambulatory Visit | Attending: Orthopaedic Surgery

## 2018-05-04 DIAGNOSIS — F329 Major depressive disorder, single episode, unspecified: Secondary | ICD-10-CM | POA: Diagnosis not present

## 2018-05-04 DIAGNOSIS — Z888 Allergy status to other drugs, medicaments and biological substances status: Secondary | ICD-10-CM | POA: Insufficient documentation

## 2018-05-04 DIAGNOSIS — E785 Hyperlipidemia, unspecified: Secondary | ICD-10-CM | POA: Insufficient documentation

## 2018-05-04 DIAGNOSIS — Z8249 Family history of ischemic heart disease and other diseases of the circulatory system: Secondary | ICD-10-CM | POA: Insufficient documentation

## 2018-05-04 DIAGNOSIS — S52551A Other extraarticular fracture of lower end of right radius, initial encounter for closed fracture: Secondary | ICD-10-CM | POA: Insufficient documentation

## 2018-05-04 DIAGNOSIS — I69351 Hemiplegia and hemiparesis following cerebral infarction affecting right dominant side: Secondary | ICD-10-CM | POA: Insufficient documentation

## 2018-05-04 DIAGNOSIS — Z7984 Long term (current) use of oral hypoglycemic drugs: Secondary | ICD-10-CM | POA: Insufficient documentation

## 2018-05-04 DIAGNOSIS — Z79899 Other long term (current) drug therapy: Secondary | ICD-10-CM | POA: Insufficient documentation

## 2018-05-04 DIAGNOSIS — J449 Chronic obstructive pulmonary disease, unspecified: Secondary | ICD-10-CM | POA: Diagnosis not present

## 2018-05-04 DIAGNOSIS — S52501A Unspecified fracture of the lower end of right radius, initial encounter for closed fracture: Secondary | ICD-10-CM | POA: Diagnosis not present

## 2018-05-04 DIAGNOSIS — E119 Type 2 diabetes mellitus without complications: Secondary | ICD-10-CM | POA: Diagnosis not present

## 2018-05-04 DIAGNOSIS — Z87891 Personal history of nicotine dependence: Secondary | ICD-10-CM | POA: Insufficient documentation

## 2018-05-04 DIAGNOSIS — I10 Essential (primary) hypertension: Secondary | ICD-10-CM | POA: Insufficient documentation

## 2018-05-04 DIAGNOSIS — F419 Anxiety disorder, unspecified: Secondary | ICD-10-CM | POA: Insufficient documentation

## 2018-05-04 DIAGNOSIS — Z7982 Long term (current) use of aspirin: Secondary | ICD-10-CM | POA: Insufficient documentation

## 2018-05-04 DIAGNOSIS — X58XXXA Exposure to other specified factors, initial encounter: Secondary | ICD-10-CM | POA: Insufficient documentation

## 2018-05-04 HISTORY — DX: Nausea with vomiting, unspecified: Z98.890

## 2018-05-04 HISTORY — DX: Nausea with vomiting, unspecified: R11.2

## 2018-05-04 HISTORY — DX: Bradycardia, unspecified: R00.1

## 2018-05-04 HISTORY — PX: ORIF WRIST FRACTURE: SHX2133

## 2018-05-04 HISTORY — DX: Other specified postprocedural states: R11.2

## 2018-05-04 LAB — CBC
HCT: 40.2 % (ref 36.0–46.0)
Hemoglobin: 12.5 g/dL (ref 12.0–15.0)
MCH: 26.5 pg (ref 26.0–34.0)
MCHC: 31.1 g/dL (ref 30.0–36.0)
MCV: 85.4 fL (ref 80.0–100.0)
PLATELETS: 406 10*3/uL — AB (ref 150–400)
RBC: 4.71 MIL/uL (ref 3.87–5.11)
RDW: 13 % (ref 11.5–15.5)
WBC: 4.9 10*3/uL (ref 4.0–10.5)
nRBC: 0 % (ref 0.0–0.2)

## 2018-05-04 LAB — GLUCOSE, CAPILLARY
Glucose-Capillary: 105 mg/dL — ABNORMAL HIGH (ref 70–99)
Glucose-Capillary: 104 mg/dL — ABNORMAL HIGH (ref 70–99)

## 2018-05-04 LAB — BASIC METABOLIC PANEL
Anion gap: 8 (ref 5–15)
BUN: 20 mg/dL (ref 6–20)
CHLORIDE: 108 mmol/L (ref 98–111)
CO2: 23 mmol/L (ref 22–32)
Calcium: 9.3 mg/dL (ref 8.9–10.3)
Creatinine, Ser: 1.01 mg/dL — ABNORMAL HIGH (ref 0.44–1.00)
GFR calc Af Amer: >60 mL/min (ref >60)
Glucose, Bld: 112 mg/dL — ABNORMAL HIGH (ref 70–99)
POTASSIUM: 3.6 mmol/L (ref 3.5–5.1)
Sodium: 139 mmol/L (ref 135–145)

## 2018-05-04 SURGERY — OPEN REDUCTION INTERNAL FIXATION (ORIF) WRIST FRACTURE
Anesthesia: Monitor Anesthesia Care | Laterality: Right

## 2018-05-04 MED ORDER — OXYCODONE HCL 5 MG PO TABS: ORAL_TABLET | ORAL | 0 refills | Status: AC

## 2018-05-04 MED ORDER — ONDANSETRON HCL 4 MG/2ML IJ SOLN
INTRAMUSCULAR | Status: DC | PRN
  Administered 2018-05-04: 4 mg via INTRAVENOUS

## 2018-05-04 MED ORDER — ONDANSETRON HCL 4 MG/2ML IJ SOLN
INTRAMUSCULAR | Status: AC
  Filled 2018-05-04: qty 2

## 2018-05-04 MED ORDER — LIDOCAINE 2% (20 MG/ML) 5 ML SYRINGE
INTRAMUSCULAR | Status: AC
  Filled 2018-05-04: qty 5

## 2018-05-04 MED ORDER — 0.9 % SODIUM CHLORIDE (POUR BTL) OPTIME
TOPICAL | Status: DC | PRN
  Administered 2018-05-04: 1000 mL

## 2018-05-04 MED ORDER — MIDAZOLAM HCL 2 MG/2ML IJ SOLN
1.0000 mg | Freq: Once | INTRAMUSCULAR | Status: AC
  Administered 2018-05-04: 1 mg via INTRAVENOUS

## 2018-05-04 MED ORDER — FENTANYL CITRATE (PF) 100 MCG/2ML IJ SOLN
50.0000 ug | Freq: Once | INTRAMUSCULAR | Status: AC
  Administered 2018-05-04: 50 ug via INTRAVENOUS

## 2018-05-04 MED ORDER — DEXAMETHASONE SODIUM PHOSPHATE 10 MG/ML IJ SOLN
INTRAMUSCULAR | Status: AC
  Filled 2018-05-04: qty 1

## 2018-05-04 MED ORDER — FENTANYL CITRATE (PF) 250 MCG/5ML IJ SOLN
INTRAMUSCULAR | Status: AC
  Filled 2018-05-04: qty 5

## 2018-05-04 MED ORDER — PROPOFOL 500 MG/50ML IV EMUL
INTRAVENOUS | Status: DC | PRN
  Administered 2018-05-04: 75 ug/kg/min via INTRAVENOUS

## 2018-05-04 MED ORDER — BUPIVACAINE-EPINEPHRINE (PF) 0.5% -1:200000 IJ SOLN
INTRAMUSCULAR | Status: DC | PRN
  Administered 2018-05-04: 30 mL via PERINEURAL

## 2018-05-04 MED ORDER — ACETAMINOPHEN 500 MG PO TABS: 1000.0000 mg | ORAL_TABLET | Freq: Three times a day (TID) | ORAL | 0 refills | Status: AC

## 2018-05-04 MED ORDER — LIDOCAINE 2% (20 MG/ML) 5 ML SYRINGE
INTRAMUSCULAR | Status: DC | PRN
  Administered 2018-05-04: 40 mg via INTRAVENOUS

## 2018-05-04 MED ORDER — HYDROMORPHONE HCL 1 MG/ML IJ SOLN: 0.2500 mg | INTRAMUSCULAR | Status: DC | PRN

## 2018-05-04 MED ORDER — PROPOFOL 10 MG/ML IV BOLUS
INTRAVENOUS | Status: DC | PRN
  Administered 2018-05-04 (×2): 20 mg via INTRAVENOUS

## 2018-05-04 MED ORDER — FENTANYL CITRATE (PF) 100 MCG/2ML IJ SOLN
INTRAMUSCULAR | Status: AC
  Administered 2018-05-04: 50 ug via INTRAVENOUS
  Filled 2018-05-04: qty 2

## 2018-05-04 MED ORDER — CHLORHEXIDINE GLUCONATE 4 % EX LIQD: 60.0000 mL | Freq: Once | CUTANEOUS | Status: DC

## 2018-05-04 MED ORDER — ONDANSETRON HCL 4 MG PO TABS: 4.0000 mg | ORAL_TABLET | Freq: Three times a day (TID) | ORAL | 1 refills | Status: AC | PRN

## 2018-05-04 MED ORDER — MIDAZOLAM HCL 2 MG/2ML IJ SOLN
INTRAMUSCULAR | Status: AC
  Administered 2018-05-04: 1 mg via INTRAVENOUS
  Filled 2018-05-04: qty 2

## 2018-05-04 MED ORDER — CEFAZOLIN SODIUM-DEXTROSE 2-4 GM/100ML-% IV SOLN
2.0000 g | INTRAVENOUS | Status: AC
  Administered 2018-05-04: 2 g via INTRAVENOUS
  Filled 2018-05-04: qty 100

## 2018-05-04 MED ORDER — LACTATED RINGERS IV SOLN
INTRAVENOUS | Status: DC
  Administered 2018-05-04 (×2): via INTRAVENOUS

## 2018-05-04 SURGICAL SUPPLY — 56 items
ALCOHOL 70% 16 OZ (MISCELLANEOUS) ×2 IMPLANT
BANDAGE ACE 4X5 VEL STRL LF (GAUZE/BANDAGES/DRESSINGS) ×2 IMPLANT
BIT DRILL 2.2 SS TIBIAL (BIT) ×2 IMPLANT
BLADE CLIPPER SURG (BLADE) IMPLANT
BNDG ESMARK 4X9 LF (GAUZE/BANDAGES/DRESSINGS) ×2 IMPLANT
BNDG PLASTER X FAST 4X5 WHT LF (CAST SUPPLIES) ×2 IMPLANT
CLSR STERI-STRIP ANTIMIC 1/2X4 (GAUZE/BANDAGES/DRESSINGS) ×2 IMPLANT
CORD BIPOLAR FORCEPS 12FT (ELECTRODE) ×2 IMPLANT
COVER SURGICAL LIGHT HANDLE (MISCELLANEOUS) ×2 IMPLANT
COVER WAND RF STERILE (DRAPES) ×2 IMPLANT
CUFF TOURNIQUET SINGLE 18IN (TOURNIQUET CUFF) ×2 IMPLANT
CUFF TOURNIQUET SINGLE 24IN (TOURNIQUET CUFF) IMPLANT
DECANTER SPIKE VIAL GLASS SM (MISCELLANEOUS) IMPLANT
DRAPE OEC MINIVIEW 54X84 (DRAPES) ×2 IMPLANT
DRAPE SURG 17X23 STRL (DRAPES) ×2 IMPLANT
ELECT REM PT RETURN 9FT ADLT (ELECTROSURGICAL) ×2
ELECTRODE REM PT RTRN 9FT ADLT (ELECTROSURGICAL) ×1 IMPLANT
GAUZE SPONGE 4X4 12PLY STRL (GAUZE/BANDAGES/DRESSINGS) ×2 IMPLANT
GAUZE SPONGE 4X4 12PLY STRL LF (GAUZE/BANDAGES/DRESSINGS) ×2 IMPLANT
GAUZE XEROFORM 1X8 LF (GAUZE/BANDAGES/DRESSINGS) ×2 IMPLANT
GLOVE BIOGEL PI IND STRL 8 (GLOVE) ×1 IMPLANT
GLOVE BIOGEL PI INDICATOR 8 (GLOVE) ×1
GLOVE ECLIPSE 8.0 STRL XLNG CF (GLOVE) ×4 IMPLANT
GOWN STRL REUS W/ TWL LRG LVL3 (GOWN DISPOSABLE) ×2 IMPLANT
GOWN STRL REUS W/ TWL XL LVL3 (GOWN DISPOSABLE) ×2 IMPLANT
GOWN STRL REUS W/TWL LRG LVL3 (GOWN DISPOSABLE) ×2
GOWN STRL REUS W/TWL XL LVL3 (GOWN DISPOSABLE) ×2
K-WIRE 1.6 (WIRE) ×1
K-WIRE FX5X1.6XNS BN SS (WIRE) ×1
KIT BASIN OR (CUSTOM PROCEDURE TRAY) ×2 IMPLANT
KIT TURNOVER KIT B (KITS) ×2 IMPLANT
KWIRE FX5X1.6XNS BN SS (WIRE) ×1 IMPLANT
NEEDLE HYPO 25GX1X1/2 BEV (NEEDLE) IMPLANT
NS IRRIG 1000ML POUR BTL (IV SOLUTION) ×2 IMPLANT
PACK ORTHO EXTREMITY (CUSTOM PROCEDURE TRAY) ×2 IMPLANT
PAD ARMBOARD 7.5X6 YLW CONV (MISCELLANEOUS) ×4 IMPLANT
PAD CAST 4YDX4 CTTN HI CHSV (CAST SUPPLIES) ×1 IMPLANT
PADDING CAST COTTON 4X4 STRL (CAST SUPPLIES) ×1
PEG LOCKING SMOOTH 2.2X16 (Screw) ×4 IMPLANT
PEG LOCKING SMOOTH 2.2X18 (Peg) ×4 IMPLANT
PEG LOCKING SMOOTH 2.2X20 (Screw) ×2 IMPLANT
PLATE NARROW DVR RIGHT (Plate) ×2 IMPLANT
SCREW LOCK 16X2.7X 3 LD TPR (Screw) ×3 IMPLANT
SCREW LOCKING 2.7X16 (Screw) ×3 IMPLANT
SCREW NONLOCK 2.7X18MM (Screw) ×2 IMPLANT
SOAP 2 % CHG 4 OZ (WOUND CARE) ×2 IMPLANT
SPONGE LAP 18X18 RF (DISPOSABLE) ×2 IMPLANT
SPONGE LAP 4X18 RFD (DISPOSABLE) IMPLANT
SUT MNCRL AB 3-0 PS2 18 (SUTURE) ×2 IMPLANT
SUT PROLENE 3 0 PS 2 (SUTURE) IMPLANT
SYR CONTROL 10ML LL (SYRINGE) IMPLANT
TOWEL OR 17X24 6PK STRL BLUE (TOWEL DISPOSABLE) ×2 IMPLANT
TOWEL OR 17X26 10 PK STRL BLUE (TOWEL DISPOSABLE) ×2 IMPLANT
TUBE CONNECTING 12X1/4 (SUCTIONS) ×2 IMPLANT
WATER STERILE IRR 1000ML POUR (IV SOLUTION) IMPLANT
YANKAUER SUCT BULB TIP NO VENT (SUCTIONS) ×2 IMPLANT

## 2018-05-04 NOTE — H&P (Signed)
PREOPERATIVE H&P  Chief Complaint: UNSPECIFIED FRACTURE OF THE LOWER END OF RIGHT RADIUS  HPI: Kelly Gibson is a 57 y.o. female who presents for preoperative history and physical with a diagnosis of UNSPECIFIED FRACTURE OF THE LOWER END OF RIGHT RADIUS. Symptoms are rated as moderate to severe, and have been worsening.  This is significantly impairing activities of daily living.  Please see my clinic note for full details on this patient's care.  She has elected for surgical management.   Past Medical History:  Diagnosis Date  . Bradycardia    SR/SB with up to 3.5 second pause (asymptomatic) on ~ 10/2017 event monitor; saw EP Dr. Rayann Heman: avoid AV nodal blocking agents  . COPD (chronic obstructive pulmonary disease) ([facility])   . Depression   . Diabetes mellitus   . Hyperlipidemia   . Hypertension   . PONV (postoperative nausea and vomiting)   . Stroke [facility])    Past Surgical History:  Procedure Laterality Date  . CESAREAN SECTION     x 3  . TUMOR REMOVAL     left shoulder   Social History   Socioeconomic History  . Marital status: Married    Spouse name: Not on file  . Number of children: Not on file  . Years of education: Not on file  . Highest education level: Not on file  Occupational History  . Not on file  Social Needs  . Financial resource strain: Not on file  . Food insecurity:    Worry: Not on file    Inability: Not on file  . Transportation needs:    Medical: Not on file    Non-medical: Not on file  Tobacco Use  . Smoking status: Former Smoker    Packs/day: 1.00    Years: 20.00    Pack years: 20.00  . Smokeless tobacco: Never Used  . Tobacco comment: stopped 2018  Substance and Sexual Activity  . Alcohol use: No  . Drug use: No  . Sexual activity: Not on file  Lifestyle  . Physical activity:    Days per week: Not on file    Minutes per session: Not on file  . Stress: Not on file  Relationships  . Social connections:    Talks on phone: Not on file     Gets together: Not on file    Attends religious service: Not on file    Active member of club or organization: Not on file    Attends meetings of clubs or organizations: Not on file    Relationship status: Not on file  Other Topics Concern  . Not on file  Social History Narrative  . Not on file   Family History  Problem Relation Age of Onset  . Diabetes Mother   . Stroke Mother   . Heart disease Father   . Cancer Father   . Stroke Father    Allergies  Allergen Reactions  . Niacin And Related Rash    REACTION IS SIDE EFFECT    Prior to Admission medications   Medication Sig Start Date End Date Taking? Authorizing Provider  albuterol (PROVENTIL HFA;VENTOLIN HFA) 108 (90 Base) MCG/ACT inhaler Inhale 2 puffs into the lungs every 6 (six) hours as needed for wheezing or shortness of breath. 10/18/17  Yes [place], Modena Nunnery, MD  amLODipine (NORVASC) 10 MG tablet Take 1 tablet (10 mg total) by mouth daily. 03/23/18  Yes Delsa Grana, PA-C  aspirin EC 325 MG EC tablet Take 1 tablet (325  mg total) by mouth daily. 12/10/17  Yes Eulogio Bear U, DO  glyBURIDE (DIABETA) 2.5 MG tablet Take 1 tablet (2.5 mg total) by mouth 2 (two) times daily with a meal. 01/19/18  Yes [place], Modena Nunnery, MD  HYDROcodone-acetaminophen (NORCO/VICODIN) 5-325 MG tablet Take 1 tablet by mouth every 6 (six) hours as needed for severe pain. 04/18/18  Yes Fawze, Mina A, PA-C  lisinopril (PRINIVIL,ZESTRIL) 5 MG tablet Take 1 tablet (5 mg total) by mouth daily. 04/05/18  Yes [place], Modena Nunnery, MD  rosuvastatin (CRESTOR) 40 MG tablet Take 1 tablet (40 mg total) by mouth daily at 6 PM. 04/05/18  Yes [place], Modena Nunnery, MD  umeclidinium bromide (INCRUSE ELLIPTA) 62.5 MCG/INH AEPB Inhale 1 puff into the lungs daily. 03/23/18  Yes , Modena Nunnery, MD     Positive ROS: All other systems have been reviewed and were otherwise negative with the exception of those mentioned in the HPI and as above.  Physical Exam: General: Alert, no  acute distress Cardiovascular: No pedal edema Respiratory: No cyanosis, no use of accessory musculature GI: No organomegaly, abdomen is soft and non-tender Skin: No lesions in the area of chief complaint Neurologic: Sensation intact distally Psychiatric: Patient is competent for consent with normal mood and affect Lymphatic: No axillary or cervical lymphadenopathy  MUSCULOSKELETAL: R wrist - cast in place, unable to check skin, lost reduction, no motor at baseline, minimal sensation at baseline.   Assessment: UNSPECIFIED FRACTURE OF THE LOWER END OF RIGHT RADIUS  Plan: Plan for Procedure(s): OPEN REDUCTION INTERNAL FIXATION (ORIF) RIGHT  WRIST FRACTURE  The risks benefits and alternatives were discussed with the patient including but not limited to the risks of nonoperative treatment, versus surgical intervention including infection, bleeding, nerve injury,  blood clots, cardiopulmonary complications, morbidity, mortality, among others, and they were willing to proceed.   We specifically discussed her increased risks secondary to previous stroke and CAD.  Hiram Gash, MD  05/04/2018 10:17 AM

## 2018-05-04 NOTE — Telephone Encounter (Signed)
   Primary Cardiologist: Pixie Casino, MD  Chart reviewed as part of pre-operative protocol coverage. Seems patient had surgery done today. Please ask surgeon office if clearance still needed. If not, please close encounter.    [place], [place] 05/04/2018, 3:06 PM

## 2018-05-04 NOTE — Progress Notes (Signed)
Orthopedic Tech Progress Note Patient Details:  Kelly Gibson October 25, 1960 [id number]  Casting Type of Cast: Short arm cast Cast Location: rue Cast Material: Fiberglass Cast Intervention: Removal  Post Interventions Patient Tolerated: Well Instructions Provided: Care of device  As ordered by Dr. Ferdinand Lango, Ivoree Felmlee 05/04/2018, 11:17 AM

## 2018-05-04 NOTE — Anesthesia Preprocedure Evaluation (Addendum)
Anesthesia Evaluation  Patient identified by MRN, date of birth, ID band Patient awake    Reviewed: Allergy & Precautions, H&P , NPO status , Patient's Chart, lab work & pertinent test results  History of Anesthesia Complications (+) PONV  Airway Mallampati: II  TM Distance: >3 FB Neck ROM: Full    Dental no notable dental hx. (+) Teeth Intact, Dental Advisory Given   Pulmonary COPD,  COPD inhaler, former smoker,    Pulmonary exam normal breath sounds clear to auscultation       Cardiovascular hypertension, Pt. on medications  Rhythm:Regular Rate:Normal     Neuro/Psych Anxiety Depression CVA, Residual Symptoms    GI/Hepatic negative GI ROS, Neg liver ROS,   Endo/Other  diabetes, Type 2, Oral Hypoglycemic Agents  Renal/GU negative Renal ROS  negative genitourinary   Musculoskeletal   Abdominal   Peds  Hematology negative hematology ROS (+)   Anesthesia Other Findings   Reproductive/Obstetrics negative OB ROS                            Anesthesia Physical Anesthesia Plan  ASA: II  Anesthesia Plan: Regional and MAC   Post-op Pain Management:    Induction: Intravenous  PONV Risk Score and Plan: 3 and Ondansetron, Dexamethasone, Propofol infusion and Midazolam  Airway Management Planned: Simple Face Mask  Additional Equipment:   Intra-op Plan:   Post-operative Plan:   Informed Consent: I have reviewed the patients History and Physical, chart, labs and discussed the procedure including the risks, benefits and alternatives for the proposed anesthesia with the patient or authorized representative who has indicated his/her understanding and acceptance.   Dental advisory given  Plan Discussed with: CRNA  Anesthesia Plan Comments:         Anesthesia Quick Evaluation

## 2018-05-04 NOTE — Transfer of Care (Signed)
Immediate Anesthesia Transfer of Care Note  Patient: Kelly Gibson  Procedure(s) Performed: OPEN REDUCTION INTERNAL FIXATION (ORIF) RIGHT  WRIST FRACTURE (Right )  Patient Location: PACU  Anesthesia Type:MAC combined with regional for post-op pain  Level of Consciousness: awake, alert  and oriented  Airway & Oxygen Therapy: Patient Spontanous Breathing and Patient connected to face mask oxygen  Post-op Assessment: Report given to RN and Post -op Vital signs reviewed and stable  Post vital signs: Reviewed and stable  Last Vitals:  Vitals Value Taken Time  BP    Temp 97.0   Pulse 72   Resp 19 05/04/2018 12:29 PM  SpO2 100%   Vitals shown include unvalidated device data.  Last Pain:  Vitals:   05/04/18 0949  TempSrc: Oral  PainSc:       Patients Stated Pain Goal: 0 (05/21/47 6282)  Complications: No apparent anesthesia complications

## 2018-05-04 NOTE — Op Note (Signed)
Orthopaedic Surgery Operative Note (CSN: [id number])  Kelly Gibson  May 16, 1961 Date of Surgery: 05/04/2018   Diagnoses:  Unstable right extraarticular distal radius fracture  Procedure: ORIF extraarticular distal radius fracture   Operative Finding Successful completion of planned procedure.  Good fixation of the fracture fragments and DRUJ was stable end of case  Post-operative plan: The patient will be weightbearing in a splint.  The patient will be charged home.  DVT prophylaxis not indicated in this patient who otherwise is at baseline risk for DVT in the setting of stroke and chronically anticoagulated.  Pain control with PRN pain medication preferring oral medicines.  Follow up plan will be scheduled in approximately 7 days for incision check and XR.  Post-Op Diagnosis: Same Surgeons:Primary: Hiram Gash, MD Assistants: Joya Gaskins, OPAC Location: [facility] OR ROOM 06 Anesthesia: Choice Antibiotics: Ancef 2g preop, Vancomycin thousand mg locally  Tourniquet time: 30 minutes Estimated Blood Loss: Minimal Complications: None Specimens: None Implants: Narrow Biomet DVR plate  Indications for Surgery:   SHANECE Gibson is a 57 y.o. female with history of stroke and right-sided hemiparesis with minimal sensation had a fracture was very unstable of the distal radius.  2 weeks we attempted nonoperative measures with multiple reduction attempts and casting but she was continuing to be unstable and reported pain.  Talked to the family and the patient about CRP P versus casting in place versus ORIF and based on her pain and the risks of potential loss of reduction with CRP P she elected to proceed with ORIF.  Benefits and risks of operative and nonoperative management were discussed prior to surgery with patient/guardian(s) and informed consent form was completed.  Specific risks including infection, need for additional surgery, perioperative complications from anesthesia including  stroke and death, periprosthetic fracture, continued pain, nonunion and malunion   Procedure:   The patient was identified in the preoperative holding area where the surgical site was marked. The patient was taken to the OR where a procedural timeout was called and the above noted anesthesia was induced.  The patient was positioned supine on a regular bed.  Preoperative antibiotics were dosed.  The patient's right wrist was prepped and draped in the usual sterile fashion.  A second preoperative timeout was called.      A tourniquet was used for the above listed time.   An FCR approach was made exposing the volar surface of the distal radius taking care to go through the sheath of the FCR tendon tract and ulnarly exposing the inferior portion of the sheath while protecting the median nerve and radial artery on each side with blunt retractors.  This inferior portion of the sheath was incised sharply and examined for presence of the palmar cutaneous branch of the median nerve.  It was determined to not be within the field and we carried our dissection deeply to the bone splitting the pronator quadratus and exposing the fracture site.    Appropriate reduction was obtained and a narrow DVR Biomet plate was placed and checked for sizing and reduction under fluoroscopy.  This reduction was held in place with K wires and a K wire was placed into the radial styloid.    Once appropriate reduction was confirmed we then proceeded to fix the plate proximally and then proceeded to fill the distal holes with a combination of partially threaded screws and pegs.  At this point we checked our reduction to ensure that there was no intra-articular extension of our screws.  Once this was confirmed we proceeded to fill remaining 3 proximal shaft screws and obtained final images which demonstrated appropriate reduction and maintenance of alignment.  We took care to place extra locking screws the patient was relatively  insensate worried about her fixation if she tried to do too much with this arm.  The DRUJ was checked and found to be stable.  We verified that all fast guides were removed on XR and through count.    The wound was thoroughly irrigated.  The tourniquet was released prior to skin closure to verify there was no excessive bleeding and we visualized that the radial artery and median nerve were intact at the end of the case. The PQ was reapproximated grossly prior to skin closure.     The incision was thoroughly irrigated and closed in a multilayer fashion with absorbable sutures after local vancomycin was placed. A sterile dressing was placed.  A demi-splint was placed.  The patient was awoken from general anesthesia and taken to the PACU in stable condition without complication.

## 2018-05-04 NOTE — Telephone Encounter (Signed)
Spoke with Sherri @ [facility] and confirmed that pt has had her surgery today. Will close this encounter and remove from preop.

## 2018-05-04 NOTE — Anesthesia Procedure Notes (Signed)
Anesthesia Regional Block: Supraclavicular block   Pre-Anesthetic Checklist: ,, timeout performed, Correct Patient, Correct Site, Correct Laterality, Correct Procedure, Correct Position, site marked, Risks and benefits discussed, pre-op evaluation,  At surgeon's request and post-op pain management  Laterality: Right  Prep: Maximum Sterile Barrier Precautions used, Betadine       Needles:  Injection technique: Single-shot  Needle Type: Other      Needle Gauge: 22     Additional Needles:   Procedures:, nerve stimulator,,,,,,,  Narrative:  Start time: 05/04/2018 10:25 AM End time: 05/04/2018 10:35 AM Injection made incrementally with aspirations every 5 mL.  Additional Notes: 2% Lidocaine skin wheel.

## 2018-05-04 NOTE — Anesthesia Postprocedure Evaluation (Signed)
Anesthesia Post Note  Patient: Kelly Gibson  Procedure(s) Performed: OPEN REDUCTION INTERNAL FIXATION (ORIF) RIGHT  WRIST FRACTURE (Right )     Patient location during evaluation: PACU Anesthesia Type: Regional and MAC Level of consciousness: awake and alert Pain management: pain level controlled Vital Signs Assessment: post-procedure vital signs reviewed and stable Respiratory status: spontaneous breathing, nonlabored ventilation and respiratory function stable Cardiovascular status: stable and blood pressure returned to baseline Postop Assessment: no apparent nausea or vomiting Anesthetic complications: no    Last Vitals:  Vitals:   05/04/18 1300 05/04/18 1314  BP:  137/79  Pulse:  70  Resp:  16  Temp: (!) 36.3 C   SpO2: 97% 99%    Last Pain:  Vitals:   05/04/18 1314  TempSrc:   PainSc: 0-No pain                 Chasitie Passey,W. EDMOND

## 2018-05-04 NOTE — Telephone Encounter (Signed)
Clearance form fax from [employer] on 05/03/2018. PT is having surgery on 05/04/2018. Clearance form fax to [facility]ie[facility] on 05/03/2018 by Janett Billow NP at [phone]. Form receive and confirmed.

## 2018-05-05 ENCOUNTER — Encounter (HOSPITAL_COMMUNITY): Payer: Self-pay | Admitting: Orthopaedic Surgery

## 2018-05-10 ENCOUNTER — Encounter (HOSPITAL_COMMUNITY): Payer: Self-pay | Admitting: Orthopaedic Surgery

## 2018-05-10 DIAGNOSIS — S52501D Unspecified fracture of the lower end of right radius, subsequent encounter for closed fracture with routine healing: Secondary | ICD-10-CM | POA: Diagnosis not present

## 2018-05-11 NOTE — Progress Notes (Signed)
[facility] [address] [place] [address] [phone]       OFFICE FOLLOW UP NOTE  Kelly Gibson Date of Birth:  03/05/1961 Medical Record Number:  [id number]   Reason for visit: Stroke follow up  CHIEF COMPLAINT:  Chief Complaint  Patient presents with  . Follow-up    Stroke follow up room 9 patient with howard her husband     HPI: Kelly Gibson is being seen today in the office for lacunar infarcts on 12/05/17. History obtained from patient and chart review. Reviewed all radiology images and labs personally.  [name] is a 57 y.o. female with  Prior history of left brain infarct in March 2019 with residual right weakness who presented with worsening of right hemiplegia along with dizziness.  CT head reviewed and showed no acute finding.  MRI scan reviewed and showed 2 new lacunar infarcts in the left periventricular white matter and corona radiata.  CTA head and neck was negative for large vessel occlusion along with being negative for hemodynamically significant stenosis in the neck but did show moderate intracranial stenosis.  2D echo had an EF of 55 to 60% with a positive bubble study. Lower extremity ultrasound negative for DVT. Patient did have a 30-day cardiac monitor performed in April 2019 which is negative for atrial fibrillation but did show sinus bradycardia with multiple pauses and recommended for close cardiology follow-up.  Recommendation for sleep apnea testing as outpatient.  Recommendation for aspirin 325 mg for secondary stroke prevention along with Crestor 40 mg.  Patient was discharged to [facility] for continued right hemiparesis.  01/31/2018 visit: Patient is being seen today for hospital follow-up and is accompanied by her husband.  She continues to have right hemiplegia with mild spasticity and dysarthria.  She is being followed by Dr. Providence Lanius and is planning on receiving Botox treatment today.  Continues to participate  in physical therapy and speech therapy (he has not started OT due to financial reasons).  Patient is able to ambulate short distances with assistance but does use wheelchair for long distance.  Continues to take aspirin without side effects of bleeding or bruising.  Continues to take Crestor without side effects myalgias.  Blood pressure mildly elevated at 140/92 and typical SBP 1 35-1 40.  Patient does state that she has been having increased fatigue more so since his stroke and even though it was recommended during inpatient admission to undergo OSA testing, patient is declining at this time.  Interval history 05/11/2018: Patient is being seen today for follow-up visit and is accompanied by her husband.  Since prior visit, patient sustained a wrist fracture 04/18/2018 and underwent open reduction internal fixation for right wrist fracture on 05/04/2018.  She continues to have right hemiparesis and dysarthria but continues to participate in PT but her husband is paying out of pocket for as he did not receive further orders for continuation of therapies.  She continues to use wheelchair for long distance ambulation but is able to ambulate with assistance for short distance and even being able to ambulate after set of stairs in her home.  She continues to take aspirin without side effects of bleeding or bruising.  Continues to take Crestor without side effects myalgias.  Recent lab work on 03/30/2018 showed LDL 86.  Glucose levels have also been stable ranging from 77-1 35.  Blood pressure today 142/86 and as this continues to be monitored at home typically 130s over 80s.  She  does continue to have slight memory loss but does state overall this has been improving.  She does endorse restless leg and at prior appointment, it was recommended for her to undergo OSA work-up but she continues to decline at this time.  No further concerns at this time.  Denies new or worsening stroke/TIA symptoms.   ROS:   14 system  review of systems performed and negative with exception of restless leg, memory loss and agitation  PMH:  Past Medical History:  Diagnosis Date  . Bradycardia    SR/SB with up to 3.5 second pause (asymptomatic) on ~ 10/2017 event monitor; saw EP Dr. Rayann Heman: avoid AV nodal blocking agents  . COPD (chronic obstructive pulmonary disease) ([place])   . Depression   . Diabetes mellitus   . Hyperlipidemia   . Hypertension   . PONV (postoperative nausea and vomiting)   . Stroke [facility])     PSH:  Past Surgical History:  Procedure Laterality Date  . CESAREAN SECTION     x 3  . ORIF WRIST FRACTURE Right 05/04/2018   Procedure: OPEN REDUCTION INTERNAL FIXATION (ORIF) RIGHT  WRIST FRACTURE;  Surgeon: Hiram Gash, MD;  Location: [facility];  Service: Orthopedics;  Laterality: Right;  . TUMOR REMOVAL     left shoulder    Social History:  Social History   Socioeconomic History  . Marital status: Married    Spouse name: Not on file  . Number of children: Not on file  . Years of education: Not on file  . Highest education level: Not on file  Occupational History  . Not on file  Social Needs  . Financial resource strain: Not on file  . Food insecurity:    Worry: Not on file    Inability: Not on file  . Transportation needs:    Medical: Not on file    Non-medical: Not on file  Tobacco Use  . Smoking status: Former Smoker    Packs/day: 1.00    Years: 20.00    Pack years: 20.00  . Smokeless tobacco: Never Used  . Tobacco comment: stopped 2018  Substance and Sexual Activity  . Alcohol use: No  . Drug use: No  . Sexual activity: Not on file  Lifestyle  . Physical activity:    Days per week: Not on file    Minutes per session: Not on file  . Stress: Not on file  Relationships  . Social connections:    Talks on phone: Not on file    Gets together: Not on file    Attends religious service: Not on file    Active member of club or organization: Not on file    Attends meetings of clubs or  organizations: Not on file    Relationship status: Not on file  . Intimate partner violence:    Fear of current or ex partner: Not on file    Emotionally abused: Not on file    Physically abused: Not on file    Forced sexual activity: Not on file  Other Topics Concern  . Not on file  Social History Narrative  . Not on file    Family History:  Family History  Problem Relation Age of Onset  . Diabetes Mother   . Stroke Mother   . Heart disease Father   . Cancer Father   . Stroke Father     Medications:   Current Outpatient Medications on File Prior to Visit  Medication Sig Dispense Refill  .  acetaminophen (TYLENOL) 500 MG tablet Take 2 tablets (1,000 mg total) by mouth every 8 (eight) hours for 14 days. 84 tablet 0  . albuterol (PROVENTIL HFA;VENTOLIN HFA) 108 (90 Base) MCG/ACT inhaler Inhale 2 puffs into the lungs every 6 (six) hours as needed for wheezing or shortness of breath. 1 Inhaler 1  . amLODipine (NORVASC) 10 MG tablet Take 1 tablet (10 mg total) by mouth daily. 30 tablet 1  . aspirin EC 325 MG EC tablet Take 1 tablet (325 mg total) by mouth daily. 30 tablet 0  . glyBURIDE (DIABETA) 2.5 MG tablet Take 1 tablet (2.5 mg total) by mouth 2 (two) times daily with a meal. 60 tablet 6  . HYDROcodone-acetaminophen (NORCO/VICODIN) 5-325 MG tablet Take 1 tablet by mouth every 6 (six) hours as needed for severe pain. 10 tablet 0  . lisinopril (PRINIVIL,ZESTRIL) 5 MG tablet Take 1 tablet (5 mg total) by mouth daily. 90 tablet 1  . rosuvastatin (CRESTOR) 40 MG tablet Take 1 tablet (40 mg total) by mouth daily at 6 PM. 90 tablet 1  . umeclidinium bromide (INCRUSE ELLIPTA) 62.5 MCG/INH AEPB Inhale 1 puff into the lungs daily. 30 each 6   No current facility-administered medications on file prior to visit.     Allergies:   Allergies  Allergen Reactions  . Niacin And Related Rash    REACTION IS SIDE EFFECT      Physical Exam  Vitals:   05/12/18 0822  BP: (!) 142/86  Pulse:  90  Weight: 145 lb (65.8 kg)   Body mass index is 25.69 kg/m. No exam data present  General: well developed, well nourished, pleasant middle-aged African-American female, seated, in no evident distress Head: head normocephalic and atraumatic.   Neck: supple with no carotid or supraclavicular bruits Cardiovascular: regular rate and rhythm, no murmurs Musculoskeletal: no deformity Skin:  no rash/petichiae Vascular:  Normal pulses all extremities  Neurologic Exam Mental Status: Awake and fully alert. mild dysarthria.  Oriented to place and time. Recent and remote memory intact. Attention span, concentration and fund of knowledge appropriate. Mood and affect appropriate.  Cranial Nerves: Fundoscopic exam reveals sharp disc margins. Pupils equal, briskly reactive to light. Extraocular movements full without nystagmus. Visual fields full to confrontation. Hearing intact. Facial sensation intact.  Mild right facial palsy Motor: Right upper and lower spasticity; RUE: 0/5 but is able to shrug right shoulder; RLE: 1/5 and quadricep 3+/5; LUE and LLE: 5/5 Sensory.:  Decreased sensation RUE and RLE Coordination: Rapid alternating movements normal on left upper and lower extremity.  Finger-to-nose and heel-to-shin performed accurately left upper and lower extremities Gait and Station: Patient using wheelchair today but she did ambulate down the hallway with assistance of her husband.  She does ambulate with hemiplegic gait with stiffened right leg Reflexes: 1+ and symmetric. Toes downgoing.      Diagnostic Data (Labs, Imaging, Testing)  CT HEAD WO CONTRAST 12/05/2017 IMPRESSION: No acute finding by CT. Extensive old ischemic changes throughout the brain as outlined above.  MR BRAIN WO CONTRAST 12/05/2017 IMPRESSION: 1. Small acute lacunar infarct along the left frontal horn periventricular white matter. Small subacute lacunar infarct in the left posterior corona radiata. No acute hemorrhage  or mass effect. 2. Underlying very advanced chronic small vessel disease redemonstrated. Numerous chronic lacunar infarcts in the deep gray matter nuclei, brainstem, and cerebral white matter including the corpus callosum.  CT ANGIO HEAD W OR WO CONTRAST CT ANGIO NECK W OR WO CONTRAST 12/05/2017 IMPRESSION: 1.  Negative for large vessel occlusion. 2. Positive for widespread atherosclerosis in the head, neck, and chest. Abundant soft plaque of the aortic arch and the proximal great vessels. 3. No hemodynamically significant stenosis in the neck, but Moderate intracranial stenoses: - distal Left vertebral artery. - distal 3rd Basilar Artery proximal to the SCA origins. - Right MCA posterior M2 branch origin. 4.  Stable CT appearance of the brain since 1250 hours today. 5. Nonspecific mediastinal lymphadenopathy is partially visible. RECOMMEND routine follow-up Chest CT (IV contrast preferred) further characterize.  ECHOCARDIOGRAM 12/06/2017 Study Conclusions  - Left ventricle: The cavity size was normal. There was moderate   concentric hypertrophy. Systolic function was normal. The   estimated ejection fraction was in the range of 55% to 60%. Wall   motion was normal; there were no regional wall motion   abnormalities. Doppler parameters are consistent with abnormal   left ventricular relaxation (grade 1 diastolic dysfunction). - Aortic valve: There was no regurgitation. - Mitral valve: There was mild regurgitation. - Right ventricle: The cavity size was normal. Wall thickness was   normal. Systolic function was normal. - Right atrium: The atrium was normal in size. - Atrial septum: There was a patent foramen ovale. - Tricuspid valve: There was no regurgitation. - Pulmonary arteries: Systolic pressure could not be accurately   estimated.  Impressions:  - Positive bubble study.    ASSESSMENT: NORLENE LANES is a 57 y.o. year old female here with lacunar stroke on  12/05/17 secondary to small vessel disease. Vascular risk factors include HTN, HLD and DM.  Patient returns today for follow-up visit and does continue to have right hemiparesis and dysarthria but otherwise has been stable from a stroke standpoint.    PLAN: -Continue aspirin 325 mg daily  and Crestor for secondary stroke prevention -F/u with PCP regarding your HLD, HTN and DM management -Continue to follow with Dr. Dianna Limbo for spasticity of right side -as patient is currently in a right arm brace due to recent wrist surgery, she prefers to follow-up with Dr. Read Drivers after this is healed but husband does plan on calling his office to schedule visit for a routine follow-up as they were unable to attend prior office visit due to surgery -Mind exercises for complaints of mild short-term memory loss -Possible OSA -patient continues to refuse OSA work-up and was reeducated on increased risk factors with untreated OSA along with complaints of restless leg.  She continues to decline but will discuss this again at follow-up visit. -continue to monitor BP at home -Referral placed for PT/ST due to continued right hemiparesis and dysarthria with memory concerns -Advised patient to continue to stay active and doing HEP along with maintaining a healthy diet -Maintain strict control of hypertension with blood pressure goal below 130/90, diabetes with hemoglobin A1c goal below 6.5% and cholesterol with LDL cholesterol (bad cholesterol) goal below 70 mg/dL. I also advised the patient to eat a healthy diet with plenty of whole grains, cereals, fruits and vegetables, exercise regularly and maintain ideal body weight.  Follow up in 6 months or call earlier if needed   Greater than 50% of time during this 25 minute visit was spent on counseling,explanation of diagnosis of lacunar stroke, reviewing risk factor management of HLD, HTN and DM, planning of further management, discussion with patient and family and  coordination of care    Venancio Poisson, [facility]  [facility] [address] [place], [place] [address] she is doing okay  Phone  [phone] Fax [phone]

## 2018-05-12 ENCOUNTER — Encounter: Payer: Self-pay | Admitting: Adult Health

## 2018-05-12 ENCOUNTER — Ambulatory Visit (INDEPENDENT_AMBULATORY_CARE_PROVIDER_SITE_OTHER): Payer: BLUE CROSS/BLUE SHIELD | Admitting: Adult Health

## 2018-05-12 VITALS — BP 142/86 | HR 90 | Wt 145.0 lb

## 2018-05-12 DIAGNOSIS — E119 Type 2 diabetes mellitus without complications: Secondary | ICD-10-CM

## 2018-05-12 DIAGNOSIS — I69351 Hemiplegia and hemiparesis following cerebral infarction affecting right dominant side: Secondary | ICD-10-CM | POA: Diagnosis not present

## 2018-05-12 DIAGNOSIS — I6381 Other cerebral infarction due to occlusion or stenosis of small artery: Secondary | ICD-10-CM | POA: Diagnosis not present

## 2018-05-12 DIAGNOSIS — E785 Hyperlipidemia, unspecified: Secondary | ICD-10-CM | POA: Diagnosis not present

## 2018-05-12 DIAGNOSIS — I10 Essential (primary) hypertension: Secondary | ICD-10-CM

## 2018-05-12 DIAGNOSIS — I69322 Dysarthria following cerebral infarction: Secondary | ICD-10-CM

## 2018-05-12 NOTE — Patient Instructions (Signed)
Continue aspirin 325 mg daily  and Crestor  for secondary stroke prevention  Continue to follow up with PCP regarding cholesterol, blood pressure and diabetes management   Continue therapy for continued right side weakness  Follow up with Dr. Letta Pate for possible need of future injections  Consider sleep apnea testing in the future  Continue to monitor blood pressure at home  Maintain strict control of hypertension with blood pressure goal below 130/90, diabetes with hemoglobin A1c goal below 6.5% and cholesterol with LDL cholesterol (bad cholesterol) goal below 70 mg/dL. I also advised the patient to eat a healthy diet with plenty of whole grains, cereals, fruits and vegetables, exercise regularly and maintain ideal body weight.  Followup in the future with me in 6 months or call earlier if needed       Thank you for coming to see [place] at [facility]. I hope we have been able to provide you high quality care today.  You may receive a patient satisfaction survey over the next few weeks. We would appreciate your feedback and comments so that we may continue to improve ourselves and the health of our patients.

## 2018-05-12 NOTE — Progress Notes (Signed)
I agree with the above plan 

## 2018-05-31 DIAGNOSIS — S52501D Unspecified fracture of the lower end of right radius, subsequent encounter for closed fracture with routine healing: Secondary | ICD-10-CM | POA: Diagnosis not present

## 2018-06-30 DIAGNOSIS — S52501D Unspecified fracture of the lower end of right radius, subsequent encounter for closed fracture with routine healing: Secondary | ICD-10-CM | POA: Diagnosis not present

## 2018-07-21 ENCOUNTER — Other Ambulatory Visit: Payer: Self-pay | Admitting: Family Medicine

## 2018-08-11 ENCOUNTER — Other Ambulatory Visit: Payer: Self-pay | Admitting: Family Medicine

## 2018-08-18 ENCOUNTER — Other Ambulatory Visit: Payer: Self-pay

## 2018-08-18 MED ORDER — AMLODIPINE BESYLATE 10 MG PO TABS: 10.0000 mg | ORAL_TABLET | Freq: Every day | ORAL | 0 refills | Status: DC

## 2018-09-05 ENCOUNTER — Other Ambulatory Visit: Payer: Self-pay | Admitting: Family Medicine

## 2018-09-08 ENCOUNTER — Other Ambulatory Visit: Payer: Self-pay | Admitting: Family Medicine

## 2018-10-04 ENCOUNTER — Other Ambulatory Visit: Payer: Self-pay | Admitting: Family Medicine

## 2018-10-17 ENCOUNTER — Other Ambulatory Visit: Payer: Self-pay | Admitting: Family Medicine

## 2018-10-17 NOTE — Telephone Encounter (Signed)
Last OV 03/23/2018  Last refilled 10/04/2018 by [facility]  Need OV

## 2018-10-18 NOTE — Telephone Encounter (Signed)
Refill and OV requirement up to PCP.

## 2018-11-08 ENCOUNTER — Other Ambulatory Visit: Payer: Self-pay | Admitting: Family Medicine

## 2018-11-09 ENCOUNTER — Telehealth: Payer: Self-pay

## 2018-11-09 NOTE — Telephone Encounter (Signed)
I called pts listed number about visit on 11/15/2018 at our office. I ask for [place] and was told she was not at home. I requested to speak with the husband and was told he was not at home. I explain that pt has a appt on 11/15/2018 at 745am. Due to COVID 19 we are only doing video visit only. We are not doing in office visits at this time. The person stated she will give her husband the phone and message.I ask the person was she [name], and the reply was no. The person who was a woman stated the message will be deliver.

## 2018-11-09 NOTE — Telephone Encounter (Signed)
11-09-2018 pt husband called and gave verbal consent to file insurance for VV. Pt husband email is:[email or web address] pt asked to download [employer] webex app/program [email or web address]

## 2018-11-14 NOTE — Progress Notes (Signed)
[facility] [address] [place] [address] [phone]       VIRTUAL VISIT FOLLOW UP NOTE  [name] Date of Birth:  09/24/1960 Medical Record Number:  [id number]   Reason for visit: Stroke follow up  Virtual Visit via Video Note  I connected with Kelly Gibson on 11/14/18 at  7:45 AM EDT by a video enabled telemedicine application located remotely in my own home and verified that I am speaking with the correct person using two identifiers who was located at their own home.   I discussed the limitations of evaluation and management by telemedicine and the availability of in person appointments. The patient expressed understanding and agreed to proceed.    HPI:    Kelly Gibson  is a 58 y.o. female with underlying medical history of HTN, HLD and DM and has been followed in this office regarding left-sided lacunar infarcts secondary to small vessel disease in 11/2017 with residual deficits of spastic right hemiplegia and dysarthria.Marland Kitchen  She was initially scheduled for follow-up face-to-face in office visit but due to COVID-19 safety precautions, visit transitioned to telemedicine via WebEx.  She was last evaluated in this office on 05/11/2018 with continued spastic right hemiparesis and dysarthria.  She continues to have right spastic hemiparesis without reported improvement but denies worsening but does endorse improvement of her dysarthria and cognition.  She is no longer participating in therapy and "occasionally" do exercises on her own at home.  She does endorse waking up frequently in the middle the night with right arm and leg pain with a tightness sensation in this will make it difficult for her to fall back asleep.  She does endorse doing passive range of motion exercises during the day.  She was previously following with Dr. Letta Pate for Botox injections but has not returned since 01/2018.  She has not trialed muscle relaxants in the  past.  She continues to live at home with her husband who assists with majority of ADLs and IADLs.  She is able to ambulate short distance with rolling walker and uses wheelchair for long distance.  Denies any recent falls. She continues on aspirin without side effects of bleeding or bruising.  Continues on rosuvastatin without side effects of myalgias.  Blood pressure ranging 110s/70s.  Glucose levels stable.  No further concerns at this time.  Denies new or worsening stroke/TIA symptoms.   ROS:   14 system review of systems performed and negative with exception of muscle pain, weakness, and speech difficulty  PMH:  Past Medical History:  Diagnosis Date  . Bradycardia    SR/SB with up to 3.5 second pause (asymptomatic) on ~ 10/2017 event monitor; saw EP Dr. Rayann Heman: avoid AV nodal blocking agents  . COPD (chronic obstructive pulmonary disease) ([facility])   . Depression   . Diabetes mellitus   . Hyperlipidemia   . Hypertension   . PONV (postoperative nausea and vomiting)   . Stroke [facility])     PSH:  Past Surgical History:  Procedure Laterality Date  . CESAREAN SECTION     x 3  . ORIF WRIST FRACTURE Right 05/04/2018   Procedure: OPEN REDUCTION INTERNAL FIXATION (ORIF) RIGHT  WRIST FRACTURE;  Surgeon: Hiram Gash, MD;  Location: [place];  Service: Orthopedics;  Laterality: Right;  . TUMOR REMOVAL     left shoulder    Social History:  Social History   Socioeconomic History  . Marital status: Married    Spouse  name: Not on file  . Number of children: Not on file  . Years of education: Not on file  . Highest education level: Not on file  Occupational History  . Not on file  Social Needs  . Financial resource strain: Not on file  . Food insecurity:    Worry: Not on file    Inability: Not on file  . Transportation needs:    Medical: Not on file    Non-medical: Not on file  Tobacco Use  . Smoking status: Former Smoker    Packs/day: 1.00    Years: 20.00    Pack years: 20.00  .  Smokeless tobacco: Never Used  . Tobacco comment: stopped 2018  Substance and Sexual Activity  . Alcohol use: No  . Drug use: No  . Sexual activity: Not on file  Lifestyle  . Physical activity:    Days per week: Not on file    Minutes per session: Not on file  . Stress: Not on file  Relationships  . Social connections:    Talks on phone: Not on file    Gets together: Not on file    Attends religious service: Not on file    Active member of club or organization: Not on file    Attends meetings of clubs or organizations: Not on file    Relationship status: Not on file  . Intimate partner violence:    Fear of current or ex partner: Not on file    Emotionally abused: Not on file    Physically abused: Not on file    Forced sexual activity: Not on file  Other Topics Concern  . Not on file  Social History Narrative  . Not on file    Family History:  Family History  Problem Relation Age of Onset  . Diabetes Mother   . Stroke Mother   . Heart disease Father   . Cancer Father   . Stroke Father     Medications:   Current Outpatient Medications on File Prior to Visit  Medication Sig Dispense Refill  . albuterol (PROVENTIL HFA;VENTOLIN HFA) 108 (90 Base) MCG/ACT inhaler Inhale 2 puffs into the lungs every 6 (six) hours as needed for wheezing or shortness of breath. 1 Inhaler 1  . amLODipine (NORVASC) 10 MG tablet TAKE ONE TABLET BY MOUTH DAILY 90 tablet 1  . aspirin EC 325 MG EC tablet Take 1 tablet (325 mg total) by mouth daily. 30 tablet 0  . glyBURIDE (DIABETA) 2.5 MG tablet Take 1 tablet (2.5 mg total) by mouth 2 (two) times daily with a meal. 60 tablet 6  . HYDROcodone-acetaminophen (NORCO/VICODIN) 5-325 MG tablet Take 1 tablet by mouth every 6 (six) hours as needed for severe pain. 10 tablet 0  . lisinopril (PRINIVIL,ZESTRIL) 5 MG tablet TAKE ONE TABLET BY MOUTH DAILY 30 tablet 0  . rosuvastatin (CRESTOR) 40 MG tablet Take 1 tablet (40 mg total) by mouth daily at 6 PM. 90  tablet 1  . umeclidinium bromide (INCRUSE ELLIPTA) 62.5 MCG/INH AEPB Inhale 1 puff into the lungs daily. 30 each 6   No current facility-administered medications on file prior to visit.     Allergies:   Allergies  Allergen Reactions  . Niacin And Related Rash    REACTION IS SIDE EFFECT      Physical Exam   General: well developed, well nourished, pleasant middle-aged African-American female, seated, in no evident distress   Neurologic Exam Mental Status: Awake and fully alert. mild  dysarthria.  Oriented to place and time. Recent and remote memory intact. Attention span, concentration and fund of knowledge appropriate. Mood and affect appropriate.  Cranial Nerves: Extraocular movements full without nystagmus.  Hearing intact to voice.  Right-sided facial palsy. Motor: Right upper extremity plegia with spasticity; minimal shoulder shrug.  Able to lift leg from a seated position but drifts prior to 5 seconds.  No evidence of weakness in left upper or lower extremity Sensory.:  UTA but prior visit decreased sensation right upper and lower extremity.  Subjectively decreased sensation continues but denies worsening Coordination: Rapid alternating movements normal on left upper and lower extremity.  Finger-to-nose and heel-to-shin performed accurately left upper and lower extremities.  Unable to test right upper and lower extremity due to weakness Gait and Station: Able to stand from seated position with mild difficulty and assistance of her husband.  Ambulates with rolling walker and hemiplegic gait. Reflexes:  UTA      Diagnostic Data (Labs, Imaging, Testing)  CT HEAD WO CONTRAST 12/05/2017 IMPRESSION: No acute finding by CT. Extensive old ischemic changes throughout the brain as outlined above.  MR BRAIN WO CONTRAST 12/05/2017 IMPRESSION: 1. Small acute lacunar infarct along the left frontal horn periventricular white matter. Small subacute lacunar infarct in the left posterior  corona radiata. No acute hemorrhage or mass effect. 2. Underlying very advanced chronic small vessel disease redemonstrated. Numerous chronic lacunar infarcts in the deep gray matter nuclei, brainstem, and cerebral white matter including the corpus callosum.  CT ANGIO HEAD W OR WO CONTRAST CT ANGIO NECK W OR WO CONTRAST 12/05/2017 IMPRESSION: 1. Negative for large vessel occlusion. 2. Positive for widespread atherosclerosis in the head, neck, and chest. Abundant soft plaque of the aortic arch and the proximal great vessels. 3. No hemodynamically significant stenosis in the neck, but Moderate intracranial stenoses: - distal Left vertebral artery. - distal 3rd Basilar Artery proximal to the SCA origins. - Right MCA posterior M2 branch origin. 4.  Stable CT appearance of the brain since 1250 hours today. 5. Nonspecific mediastinal lymphadenopathy is partially visible. RECOMMEND routine follow-up Chest CT (IV contrast preferred) further characterize.  ECHOCARDIOGRAM 12/06/2017 Study Conclusions  - Left ventricle: The cavity size was normal. There was moderate   concentric hypertrophy. Systolic function was normal. The   estimated ejection fraction was in the range of 55% to 60%. Wall   motion was normal; there were no regional wall motion   abnormalities. Doppler parameters are consistent with abnormal   left ventricular relaxation (grade 1 diastolic dysfunction). - Aortic valve: There was no regurgitation. - Mitral valve: There was mild regurgitation. - Right ventricle: The cavity size was normal. Wall thickness was   normal. Systolic function was normal. - Right atrium: The atrium was normal in size. - Atrial septum: There was a patent foramen ovale. - Tricuspid valve: There was no regurgitation. - Pulmonary arteries: Systolic pressure could not be accurately   estimated.  Impressions:  - Positive bubble study.    ASSESSMENT: Kelly Gibson is a 58 y.o. year old  female here with lacunar stroke on 12/05/17 secondary to small vessel disease. Vascular risk factors include HTN, HLD and DM.  She has been stable from a stroke standpoint with residual right spastic hemiparesis and mild dysarthria but does experience ongoing right upper and lower extremity pain which awakens her at night.    PLAN: -Continue aspirin 325 mg daily  and Crestor for secondary stroke prevention -F/u with PCP regarding your HLD, HTN  and DM management -Initiate tizanidine 2 mg nightly.  Advised patient of potential side effects of muscle relaxers such as drowsiness, dizziness and potential falls.  She verbalized understanding and would like to trial as pain interferes with her sleep.  Denies any pain during the day.  As her husband works full-time throughout the week, they will start Friday night to see how she responds during the day on Saturday while her husband is present.  Advised her after 1 week's time to notify office for likely need of increasing dose if no reported side effects.  Advised her that initial dose is a low dose to ensure she can tolerate -continue to monitor BP and glucose levels at home -Highly encouraged continuation of exercises along with performing passive range of motion on right upper and lower extremity -Maintain strict control of hypertension with blood pressure goal below 130/90, diabetes with hemoglobin A1c goal below 6.5% and cholesterol with LDL cholesterol (bad cholesterol) goal below 70 mg/dL. I also advised the patient to eat a healthy diet with plenty of whole grains, cereals, fruits and vegetables, exercise regularly and maintain ideal body weight.  Follow up in 6 months or call earlier if needed   Greater than 50% of time during this 30 minute visit was spent on counseling,explanation of diagnosis of lacunar stroke, reviewing risk factor management of HLD, HTN and DM, discussion regarding residual right spastic hemiparesis and ongoing management,  planning of further management, discussion with patient and family and coordination of care    Venancio Poisson, [facility]  [facility] [address] [place], [place] [address] she is doing okay  Phone [phone] Fax [phone]

## 2018-11-15 ENCOUNTER — Encounter: Payer: Self-pay | Admitting: Adult Health

## 2018-11-15 ENCOUNTER — Other Ambulatory Visit: Payer: Self-pay

## 2018-11-15 ENCOUNTER — Ambulatory Visit (INDEPENDENT_AMBULATORY_CARE_PROVIDER_SITE_OTHER): Payer: BLUE CROSS/BLUE SHIELD | Admitting: Adult Health

## 2018-11-15 DIAGNOSIS — G8111 Spastic hemiplegia affecting right dominant side: Secondary | ICD-10-CM

## 2018-11-15 DIAGNOSIS — E119 Type 2 diabetes mellitus without complications: Secondary | ICD-10-CM | POA: Diagnosis not present

## 2018-11-15 DIAGNOSIS — E785 Hyperlipidemia, unspecified: Secondary | ICD-10-CM

## 2018-11-15 DIAGNOSIS — I6381 Other cerebral infarction due to occlusion or stenosis of small artery: Secondary | ICD-10-CM

## 2018-11-15 DIAGNOSIS — I69322 Dysarthria following cerebral infarction: Secondary | ICD-10-CM

## 2018-11-15 DIAGNOSIS — I10 Essential (primary) hypertension: Secondary | ICD-10-CM

## 2018-11-15 MED ORDER — TIZANIDINE HCL 2 MG PO CAPS: 2.0000 mg | ORAL_CAPSULE | Freq: Every day | ORAL | 0 refills | Status: DC

## 2018-11-15 NOTE — Progress Notes (Signed)
I agree with the above plan 

## 2018-12-11 ENCOUNTER — Other Ambulatory Visit: Payer: Self-pay | Admitting: Family Medicine

## 2018-12-22 ENCOUNTER — Telehealth: Payer: Self-pay | Admitting: Adult Health

## 2018-12-22 ENCOUNTER — Other Ambulatory Visit: Payer: Self-pay | Admitting: Family Medicine

## 2018-12-22 NOTE — Telephone Encounter (Signed)
I called the pts husband that we dont manage his wife lisinopril. The husband he was calling about the tizandine. He stated its not helping his wife at this time. I stated message will be sent to [facility] NP. He verbalized understanding.

## 2018-12-22 NOTE — Telephone Encounter (Signed)
Pt's husband called in stating that the lisinopril (PRINIVIL,ZESTRIL) 5 MG tablet is not working for the pt and they would like to know what is the next step they can take. Please advise.

## 2018-12-25 ENCOUNTER — Other Ambulatory Visit: Payer: Self-pay | Admitting: Family Medicine

## 2018-12-26 NOTE — Telephone Encounter (Signed)
I would recommend increasing the dose at this time as she is tolerating well without side effects.  Please let me know if they are interested in increasing dose and order will be placed.

## 2018-12-27 NOTE — Telephone Encounter (Signed)
Left message for husband that Kelly Billow NP recommend increasing tizanidine. I stated if he agrees she will have to send rx to pharmacy.

## 2019-01-04 ENCOUNTER — Other Ambulatory Visit: Payer: Self-pay | Admitting: *Deleted

## 2019-01-04 MED ORDER — BLOOD GLUCOSE TEST VI STRP: ORAL_STRIP | 11 refills | Status: DC

## 2019-01-09 ENCOUNTER — Ambulatory Visit (INDEPENDENT_AMBULATORY_CARE_PROVIDER_SITE_OTHER): Payer: BC Managed Care – PPO | Admitting: Family Medicine

## 2019-01-09 ENCOUNTER — Other Ambulatory Visit: Payer: Self-pay

## 2019-01-09 VITALS — BP 138/80 | HR 96 | Temp 98.6°F | Resp 18

## 2019-01-09 DIAGNOSIS — R2681 Unsteadiness on feet: Secondary | ICD-10-CM

## 2019-01-09 DIAGNOSIS — E118 Type 2 diabetes mellitus with unspecified complications: Secondary | ICD-10-CM

## 2019-01-09 DIAGNOSIS — I69351 Hemiplegia and hemiparesis following cerebral infarction affecting right dominant side: Secondary | ICD-10-CM

## 2019-01-09 DIAGNOSIS — J410 Simple chronic bronchitis: Secondary | ICD-10-CM

## 2019-01-09 DIAGNOSIS — I10 Essential (primary) hypertension: Secondary | ICD-10-CM

## 2019-01-09 DIAGNOSIS — I69322 Dysarthria following cerebral infarction: Secondary | ICD-10-CM

## 2019-01-09 DIAGNOSIS — E785 Hyperlipidemia, unspecified: Secondary | ICD-10-CM

## 2019-01-09 MED ORDER — TIZANIDINE HCL 4 MG PO CAPS: 4.0000 mg | ORAL_CAPSULE | Freq: Three times a day (TID) | ORAL | 2 refills | Status: DC | PRN

## 2019-01-09 MED ORDER — AMLODIPINE BESYLATE 10 MG PO TABS: 10.0000 mg | ORAL_TABLET | Freq: Every day | ORAL | 1 refills | Status: DC

## 2019-01-09 MED ORDER — LISINOPRIL 5 MG PO TABS: 5.0000 mg | ORAL_TABLET | Freq: Every day | ORAL | 2 refills | Status: DC

## 2019-01-09 MED ORDER — ROSUVASTATIN CALCIUM 40 MG PO TABS: ORAL_TABLET | ORAL | 2 refills | Status: DC

## 2019-01-09 MED ORDER — GLYBURIDE 2.5 MG PO TABS: 2.5000 mg | ORAL_TABLET | Freq: Two times a day (BID) | ORAL | 2 refills | Status: DC

## 2019-01-09 MED ORDER — ALBUTEROL SULFATE HFA 108 (90 BASE) MCG/ACT IN AERS: 2.0000 | INHALATION_SPRAY | Freq: Four times a day (QID) | RESPIRATORY_TRACT | 1 refills | Status: DC | PRN

## 2019-01-09 MED ORDER — INCRUSE ELLIPTA 62.5 MCG/INH IN AEPB: 1.0000 | INHALATION_SPRAY | Freq: Every day | RESPIRATORY_TRACT | 2 refills | Status: DC

## 2019-01-09 MED ORDER — TIZANIDINE HCL 4 MG PO TABS: 4.0000 mg | ORAL_TABLET | Freq: Four times a day (QID) | ORAL | 2 refills | Status: DC | PRN

## 2019-01-09 NOTE — Patient Instructions (Addendum)
Referral to [facility]  Zanaflex changed to 4mg   Medications refilled F/U 3 months

## 2019-01-09 NOTE — Progress Notes (Signed)
Subjective:    Patient ID: Kelly Gibson, female    DOB: 1961/05/23, 58 y.o.   MRN: [id number]  Patient presents for Diabetes and Hypertension  Pt here to f/u chronic medical problems  Last visit in August  2019 He suffered a stroke back in 2019 resulting on right sided hemiparesis of the right upper extremity and the right lower extremity.  She has been walking some with a hemiwalker but has been notably more weaker.  They would like to restart physical therapy at neuro rehab.  She is unable to utilize the right upper extremity at all and also has chronic swelling in the hands.  She has not had any recent falls her husband is with her today who cares for her.  She also has dysarthria due to her stroke but her dysphasia has actually improved.  They would like to get her back with speech therapy as well.  Recently seen by neurology she has spasticity and she was given Zanaflex 2 mg at bedtime as this is when she has most of her discomfort.  They states that doses not work they were told she can get a higher dose but has had difficulty getting contact with the office.  Diabetes mellitus this has improved since her stroke.  Her last A1c was 6.6% in September 2019 she is here only on glyburide 2.5 mg twice a day.  She is also on ACE inhibitor and Crestor.  CBG typically in  120's fasting - did not bring meter   Review Of Systems:  GEN- denies fatigue, fever, weight loss,weakness, recent illness HEENT- denies eye drainage, change in vision, nasal discharge, CVS- denies chest pain, palpitations RESP- denies SOB, cough, wheeze ABD- denies N/V, change in stools, abd pain GU- denies dysuria, hematuria, dribbling, incontinence MSK- denies joint pain, muscle aches, injury Neuro- denies headache, dizziness, syncope, seizure activity       Objective:    BP 138/80   Pulse 96   Temp 98.6 F (37 C)   Resp 18   LMP 12/10/2012   SpO2 99%  GEN- NAD, alert and oriented x3,sitting in  wheelchair  HEENT- PERRL, EOMI, non injected sclera, pink conjunctiva, MMM, oropharynx clear, slurred speech, takes time to get words out , thinning translucent upper front teeth  Neck- Supple, fair ROM CVS- RRR, no murmur RESP-CTAB ABD-NABS,soft,NT,ND EXT- + non pitting edema of Right hand,  Neuro- Stregnth 0/5 RUE, 2/5 RLE , LLE-5/5 LUE 5/5 Decreased tone Right side Pulses- Radial, DP- 2+        Assessment & Plan:   Recommend she f/u dentist for teeth issue   Problem List Items Addressed This Visit      Unprioritized   Controlled diabetes mellitus type 2 with complications (HCC)    Recheck renal function and A1C Tolerating oral meds       Relevant Medications   lisinopril (ZESTRIL) 5 MG tablet   rosuvastatin (CRESTOR) 40 MG tablet   glyBURIDE (DIABETA) 2.5 MG tablet   Other Relevant Orders   Hemoglobin A1c (Completed)   COPD (chronic obstructive pulmonary disease) (HCC)    Doing well on incruse, rare use of rescue inhaler, no smoking      Relevant Medications   umeclidinium bromide (INCRUSE ELLIPTA) 62.5 MCG/INH AEPB   albuterol (VENTOLIN HFA) 108 (90 Base) MCG/ACT inhaler   Dysarthria, post-stroke    Referral to speech therapy      Essential hypertension, benign - Primary    Well controlled no changes  Relevant Medications   lisinopril (ZESTRIL) 5 MG tablet   rosuvastatin (CRESTOR) 40 MG tablet   amLODipine (NORVASC) 10 MG tablet   Other Relevant Orders   CBC with Differential/Platelet (Completed)   Comprehensive metabolic panel (Completed)   Hemiparesis affecting right side as late effect of cerebrovascular accident (CVA) ([place])    increase zanaflex to 4mg  at bedtime       Hyperlipidemia   Relevant Medications   lisinopril (ZESTRIL) 5 MG tablet   rosuvastatin (CRESTOR) 40 MG tablet   amLODipine (NORVASC) 10 MG tablet   Other Relevant Orders   Lipid panel (Completed)      Note: This dictation was prepared with Dragon dictation along with  smaller phrase technology. Any transcriptional errors that result from this process are unintentional.

## 2019-01-10 ENCOUNTER — Encounter: Payer: Self-pay | Admitting: Family Medicine

## 2019-01-10 LAB — HEMOGLOBIN A1C
Hgb A1c MFr Bld: 7 % of total Hgb — ABNORMAL HIGH (ref <5.7)
Mean Plasma Glucose: 154 (calc)
eAG (mmol/L): 8.5 (calc)

## 2019-01-10 LAB — CBC WITH DIFFERENTIAL/PLATELET
Absolute Monocytes: 473 cells/uL (ref 200–950)
Basophils Absolute: 41 cells/uL (ref 0–200)
Basophils Relative: 0.9 %
Eosinophils Absolute: 113 cells/uL (ref 15–500)
Eosinophils Relative: 2.5 %
HCT: 36.4 % (ref 35.0–45.0)
Hemoglobin: 12.3 g/dL (ref 11.7–15.5)
Lymphs Abs: 1719 cells/uL (ref 850–3900)
MCH: 28.3 pg (ref 27.0–33.0)
MCHC: 33.8 g/dL (ref 32.0–36.0)
MCV: 83.9 fL (ref 80.0–100.0)
MPV: 10.1 fL (ref 7.5–12.5)
Monocytes Relative: 10.5 %
Neutro Abs: 2156 cells/uL (ref 1500–7800)
Neutrophils Relative %: 47.9 %
Platelets: 369 10*3/uL (ref 140–400)
RBC: 4.34 10*6/uL (ref 3.80–5.10)
RDW: 13.3 % (ref 11.0–15.0)
Total Lymphocyte: 38.2 %
WBC: 4.5 10*3/uL (ref 3.8–10.8)

## 2019-01-10 LAB — COMPREHENSIVE METABOLIC PANEL
AG Ratio: 1.4 (calc) (ref 1.0–2.5)
ALT: 10 U/L (ref 6–29)
AST: 16 U/L (ref 10–35)
Albumin: 4.2 g/dL (ref 3.6–5.1)
Alkaline phosphatase (APISO): 89 U/L (ref 37–153)
BUN/Creatinine Ratio: 18 (calc) (ref 6–22)
BUN: 21 mg/dL (ref 7–25)
CO2: 22 mmol/L (ref 20–32)
Calcium: 9.6 mg/dL (ref 8.6–10.4)
Chloride: 107 mmol/L (ref 98–110)
Creat: 1.19 mg/dL — ABNORMAL HIGH (ref 0.50–1.05)
Globulin: 3 g/dL (calc) (ref 1.9–3.7)
Glucose, Bld: 117 mg/dL — ABNORMAL HIGH (ref 65–99)
Potassium: 4.2 mmol/L (ref 3.5–5.3)
Sodium: 141 mmol/L (ref 135–146)
Total Bilirubin: 0.4 mg/dL (ref 0.2–1.2)
Total Protein: 7.2 g/dL (ref 6.1–8.1)

## 2019-01-10 LAB — LIPID PANEL
Cholesterol: 150 mg/dL (ref <200)
HDL: 53 mg/dL (ref >=50)
LDL Cholesterol (Calc): 78 mg/dL (calc)
Non-HDL Cholesterol (Calc): 97 mg/dL (calc) (ref <130)
Total CHOL/HDL Ratio: 2.8 (calc) (ref <5.0)
Triglycerides: 102 mg/dL (ref <150)

## 2019-01-10 NOTE — Assessment & Plan Note (Signed)
Recheck renal function and A1C Tolerating oral meds

## 2019-01-10 NOTE — Assessment & Plan Note (Signed)
Well controlled no changes 

## 2019-01-10 NOTE — Assessment & Plan Note (Signed)
increase zanaflex to 4mg  at bedtime

## 2019-01-10 NOTE — Assessment & Plan Note (Signed)
>>  ASSESSMENT AND PLAN FOR TYPE 2 DIABETES MELLITUS WITH DIABETIC NEUROPATHY, WITH LONG-TERM CURRENT USE OF INSULIN  (HCC) WRITTEN ON 01/10/2019 10:04 AM BY Ogden, KAWANTA F  Recheck renal function and A1C Tolerating oral meds

## 2019-01-10 NOTE — Assessment & Plan Note (Signed)
>>  ASSESSMENT AND PLAN FOR ESSENTIAL HYPERTENSION, BENIGN WRITTEN ON 01/10/2019 10:02 AM BY Vic Blackbird F  Well controlled no changes

## 2019-01-10 NOTE — Assessment & Plan Note (Signed)
Doing well on incruse, rare use of rescue inhaler, no smoking

## 2019-01-10 NOTE — Assessment & Plan Note (Signed)
Referral to speech therapy

## 2019-02-14 ENCOUNTER — Encounter: Payer: Self-pay | Admitting: Family Medicine

## 2019-02-27 ENCOUNTER — Ambulatory Visit: Payer: BC Managed Care – PPO | Attending: Family Medicine | Admitting: Physical Therapy

## 2019-02-27 ENCOUNTER — Other Ambulatory Visit: Payer: Self-pay

## 2019-02-27 ENCOUNTER — Encounter: Payer: Self-pay | Admitting: Physical Therapy

## 2019-02-27 ENCOUNTER — Ambulatory Visit: Payer: BC Managed Care – PPO

## 2019-02-27 ENCOUNTER — Ambulatory Visit: Payer: BC Managed Care – PPO | Admitting: Occupational Therapy

## 2019-02-27 VITALS — BP 140/80

## 2019-02-27 DIAGNOSIS — I69951 Hemiplegia and hemiparesis following unspecified cerebrovascular disease affecting right dominant side: Secondary | ICD-10-CM | POA: Insufficient documentation

## 2019-02-27 DIAGNOSIS — R296 Repeated falls: Secondary | ICD-10-CM | POA: Diagnosis present

## 2019-02-27 DIAGNOSIS — R41841 Cognitive communication deficit: Secondary | ICD-10-CM | POA: Insufficient documentation

## 2019-02-27 DIAGNOSIS — R29818 Other symptoms and signs involving the nervous system: Secondary | ICD-10-CM | POA: Insufficient documentation

## 2019-02-27 DIAGNOSIS — R2689 Other abnormalities of gait and mobility: Secondary | ICD-10-CM | POA: Insufficient documentation

## 2019-02-27 DIAGNOSIS — R4701 Aphasia: Secondary | ICD-10-CM | POA: Insufficient documentation

## 2019-02-27 DIAGNOSIS — R1310 Dysphagia, unspecified: Secondary | ICD-10-CM | POA: Diagnosis present

## 2019-02-27 DIAGNOSIS — R2681 Unsteadiness on feet: Secondary | ICD-10-CM | POA: Diagnosis present

## 2019-02-27 DIAGNOSIS — R471 Dysarthria and anarthria: Secondary | ICD-10-CM

## 2019-02-27 DIAGNOSIS — M6281 Muscle weakness (generalized): Secondary | ICD-10-CM | POA: Insufficient documentation

## 2019-02-27 NOTE — Therapy (Signed)
[facility] [address] [place], [place], [address] Phone: [phone]   Fax:  [phone]  Speech Language Pathology Evaluation  Patient Details  Name: DAJA SHUPING MRN: [id number] Date of Birth: 24-Jan-1961 Referring Provider (SLP): Idamae Lusher, MD   Encounter Date: 02/27/2019  End of Session - 02/27/19 1623    Visit Number  1    Number of Visits  9    Date for SLP Re-Evaluation  05/15/19    SLP Start Time  6962    SLP Stop Time   1316    SLP Time Calculation (min)  41 min    Activity Tolerance  Patient tolerated treatment well       Past Medical History:  Diagnosis Date  . Bradycardia    SR/SB with up to 3.5 second pause (asymptomatic) on ~ 10/2017 event monitor; saw EP Dr. Rayann Heman: avoid AV nodal blocking agents  . COPD (chronic obstructive pulmonary disease) ([place])   . Depression   . Diabetes mellitus   . Hyperlipidemia   . Hypertension   . PONV (postoperative nausea and vomiting)   . Stroke [facility])     Past Surgical History:  Procedure Laterality Date  . CESAREAN SECTION     x 3  . ORIF WRIST FRACTURE Right 05/04/2018   Procedure: OPEN REDUCTION INTERNAL FIXATION (ORIF) RIGHT  WRIST FRACTURE;  Surgeon: Hiram Gash, MD;  Location: [facility];  Service: Orthopedics;  Laterality: Right;  . TUMOR REMOVAL     left shoulder    There were no vitals filed for this visit.  Subjective Assessment - 02/27/19 1239    Subjective  Pt arrives with husband. Had ST orders Oct. 2019 from [facility]. Now, orders from [facility] June 2020; saw [facility] virtual visit in April 2020.    Patient is accompained by:  Family member   husband   Currently in Pain?  No/denies         SLP Evaluation [facility] - 02/27/19 1239      SLP Visit Information   SLP Received On  02/27/19    Referring Provider (SLP)  Idamae Lusher, MD      Subjective   Subjective  "I'm getting along pretty good on my own. My husband thought I needed to  come here for the speech." Her speech gets a little slurred at the end of the day. She chokes a little bit on her liquids and when she eats.     Patient/Family Stated Goal  Pt reports frustration mostly with slower processing speed.       General Information   HPI  Pt with lacunar infarcts May 2019. 13/22 on [facility] blind upon d/c. Pt husband reports "If this is where she's at then this is where she's at - I wanted to make sure we couldn't take her any further." Pt was d/c'd with Dys II/thin. Pt has advanced to regular diet with thin, coughs "a little" on liquids only, reportedly, more frequently at night. Denies all overt s/s aspiration PNA. Pt relates cognitive deficits as "It gets cloudy sometimes." (pt cognitive speed is not as fast as it was) Pt rec'd [facility] once a week for two months - SLP worked on pt memory strategies.       Balance Screen   Has the patient fallen in the past 6 months  Yes    How many times?  2    Has the patient had a decrease in activity level because of a fear  of falling?   Yes    Is the patient reluctant to leave their home because of a fear of falling?   No      Prior Functional Status   Cognitive/Linguistic Baseline  Baseline deficits   due to CVA May 2019   Baseline deficit details  Memory, processing speed (since CVA)      Cognition   Overall Cognitive Status  Impaired/Different from baseline    Area of Impairment  Memory   processing speed   Memory  Decreased short-term memory    Memory Comments  reported      Auditory Comprehension   Overall Auditory Comprehension  Appears within functional limits for tasks assessed      Verbal Expression   Overall Verbal Expression  Impaired    Level of Generative/Spontaneous Verbalization  Conversation    Other Verbal Expression Comments  "sometimes I can't find a word I want - Ijust pick another word out and use it." (pt states this occurs approx once/week- husband supects this is the case) Pt min-mod complex  conversation was WNL today.      Oral Motor/Sensory Function   Overall Oral Motor/Sensory Function  Other (comment)   not tested due to clinic masking policy     Motor Speech   Overall Motor Speech  Impaired    Respiration  Impaired    Level of Impairment  Phrase    Phonation  Hoarse   min   Articulation  Impaired    Level of Impairment  Phrase    Intelligibility  Intelligible                      SLP Education - 02/27/19 1621    Education Details  schedule conversation and heavier thinking appointments/errands/visits in the morning, last liquids should be at supper if you cough with liquids after supper (fatigue), signs/sx aspiration PNA, keeping a cough log, therapy focus/goals    Person(s) Educated  Patient;Spouse    Methods  Explanation    Comprehension  Verbalized understanding       SLP Short Term Goals - 02/27/19 1635      SLP SHORT TERM GOAL #1   Title  pt will use compensations for processing speed with rare min A to do so    Time  4    Period  Weeks    Status  New      SLP SHORT TERM GOAL #2   Title  pt will report 2 compensations for memory to SLP with rare min A    Time  4    Period  Weeks    Status  New      SLP SHORT TERM GOAL #3   Title  pt or husband will tell SLP 3 overt s/s aspiration PNA with modified independence over two sessions    Time  4    Period  Weeks    Status  New      SLP SHORT TERM GOAL #4   Title  if pt desires updated HEP for dysarthria, complete dysarthria HEP with occasional min A over two sessions    Time  4    Period  Weeks    Status  New       SLP Long Term Goals - 02/27/19 1638      SLP LONG TERM GOAL #1   Title  pt will tell SLP 4 ways she can practically compensate in order to improve her memory skills, with occasional  min A    Time  8    Period  Weeks   or 9 visits, for all LTGs   Status  New      SLP LONG TERM GOAL #2   Title  Pt will give exampels of how she is using compensatory strategies for  processing speed between 3 sessions    Time  8    Period  Weeks    Status  New      SLP LONG TERM GOAL #3   Title  pt will tell SLP/demo how she is compensating for decr'd memory skills between 3 sessions    Time  8    Period  Weeks    Status  New      SLP LONG TERM GOAL #4   Title  pt will report better QOL compared to evaluation day, regarding processing speed    Time  8    Period  Weeks    Status  New       Plan - 02/27/19 1624    Clinical Impression Statement  Harriettta Mckiver presents today with mild dysarthria, reported decr'd processing speed, reported rare coughing with liquids primarily at night time, and reported expressive aphasia after a CVA in May 2019. She rec'd home health ST once a week for approx 8-9 weeks until things shut down due to COVID-19. Pt expressed most concern about her inability to use her lt arm, hand, and leg correctly. After questioning by SLP, pt reports she is disappointed most about her slower processing speed, and states she is bothered little about her dysarthria, memory, word finding, and dysphagia. No overt s/sx aspiration PNA. Because pt is most concerned about her processing speed, I recommend skilled ST for focus on teaching compensations for this and will also provide compensations for pt to use for memory, dysarthria,and aphasia, and will provide an updated HEP for dysarthria if pt desires (she is thinking about if she would like that or not).    Speech Therapy Frequency  1x /week    Duration  --   8 weeks, or 9 total visits   Treatment/Interventions  Language facilitation;Cueing hierarchy;Multimodal communcation approach;Functional tasks;Trials of upgraded texture/liquids;Compensatory techniques;Patient/family education;SLP instruction and feedback;Cognitive reorganization;Aspiration precaution training;Pharyngeal strengthening exercises;Environmental controls;Internal/external aids;Oral motor exercises;Diet toleration management by SLP     Potential to Achieve Goals  Good   time post onset   Consulted and Agree with Plan of Care  Patient;Family member/caregiver    Family Member Consulted  husband       Patient will benefit from skilled therapeutic intervention in order to improve the following deficits and impairments:   1. Dysarthria and anarthria   2. Dysphagia, unspecified type   3. Aphasia   4. Cognitive communication deficit       Problem List Patient Active Problem List   Diagnosis Date Noted  . Hypokalemia   . Dysphagia, post-stroke   . Hemiparesis affecting right side as late effect of cerebrovascular accident (CVA) ([place])   . Dysarthria, post-stroke   . Gait disturbance, post-stroke   . Bradycardia   . Weakness 10/26/2017  . Cerebrovascular disease 10/18/2017  . COPD (chronic obstructive pulmonary disease) ([place]) 02/18/2015  . Non compliance w medication regimen 06/17/2013  . Ganglion cyst 03/09/2012  . Anxiety 03/09/2012  . Essential hypertension, benign 12/17/2011  . Obesity, unspecified 12/17/2011  . Hyperlipidemia 12/17/2011  . Depression 12/17/2011  . Controlled diabetes mellitus type 2 with complications ([place]) 41/66/0630    [place] ,[place], [place]  02/27/2019, 4:45 PM  [facility] [address] [place], [place], [address] Phone: [phone]   Fax:  [phone]  Name: JALEXIS BREED MRN: [id number] Date of Birth: 06/20/1961

## 2019-02-27 NOTE — Patient Instructions (Addendum)
  Since you said your talking is more troublesome (of swallowing, speech, and thinking/memory) we will work on that. You can bring your exercises you got for your speech if you'd like, next time.  Please keep a log of when you cough on your food or liquids and bring it to therapy so I can see it.  You should schedule things you will need to talk and to think more clearly in the morning, when your speech and your thinking is clearer.  If you cough more on liquids when it's later at night, you should have your last drinks at supper.    Signs of Aspiration Pneumonia   . Chest pain/tightness . Fever (can be low grade) . Cough  o With foul-smelling phlegm (sputum) o With sputum containing pus or blood o With greenish sputum . Fatigue  . Shortness of breath  . Wheezing   **IF YOU HAVE THESE SIGNS, CONTACT YOUR DOCTOR OR GO TO THE EMERGENCY DEPARTMENT OR URGENT CARE AS SOON AS POSSIBLE**

## 2019-02-28 ENCOUNTER — Telehealth: Payer: Self-pay | Admitting: Physical Therapy

## 2019-02-28 DIAGNOSIS — I69351 Hemiplegia and hemiparesis following cerebral infarction affecting right dominant side: Secondary | ICD-10-CM

## 2019-02-28 DIAGNOSIS — R269 Unspecified abnormalities of gait and mobility: Secondary | ICD-10-CM

## 2019-02-28 DIAGNOSIS — R252 Cramp and spasm: Secondary | ICD-10-CM

## 2019-02-28 DIAGNOSIS — R29898 Other symptoms and signs involving the musculoskeletal system: Secondary | ICD-10-CM

## 2019-02-28 DIAGNOSIS — I69398 Other sequelae of cerebral infarction: Secondary | ICD-10-CM

## 2019-02-28 NOTE — Telephone Encounter (Signed)
Dr. Buelah Manis, [name] was evaluated by PT on 8/3.  The patient would also benefit from an OT evaluation for RUE weakness and spasticity. If you agree, please submit an OT eval and treat order in [facility] or fax to [facility] at [phone].  She will also benefit from use of a Right AFO in order to improve safety and efficiency with functional mobility.  If you agree, please submit request in EPIC under MD Order, Other Orders (list Right AFO in comments) or fax to [facility] at [phone].   Thank you, Rico Junker, PT, DPT 02/28/19    10:23 AM [facility] [address] [place], [place]  [address] Phone:  [phone] Fax:  [phone]

## 2019-02-28 NOTE — Therapy (Addendum)
[facility] [address] [place], [place], [address] Phone: [phone]   Fax:  [phone]  Physical Therapy Evaluation  Patient Details  Name: Kelly Gibson MRN: [id number] Date of Birth: 11/12/1960 Referring Provider (PT): Alycia Rossetti, MD   Encounter Date: 02/27/2019   CLINIC OPERATION CHANGES: Outpatient Neuro Rehab is open at lower capacity following universal masking, social distancing, and patient screening.  The patient's COVID risk of complications score is 4.   PT End of Session - 02/28/19 0934    Visit Number  1    Number of Visits  17    Date for PT Re-Evaluation  04/29/19    Authorization Type  [employer] 2020; 30 VL for PT    Authorization - Visit Number  1    Authorization - Number of Visits  30    PT Start Time  1130    PT Stop Time  1230    PT Time Calculation (min)  60 min    Activity Tolerance  Patient tolerated treatment well    Behavior During Therapy  WFL for tasks assessed/performed       Past Medical History:  Diagnosis Date  . Bradycardia    SR/SB with up to 3.5 second pause (asymptomatic) on ~ 10/2017 event monitor; saw EP Dr. Rayann Heman: avoid AV nodal blocking agents  . COPD (chronic obstructive pulmonary disease) ([place])   . Depression   . Diabetes mellitus   . Hyperlipidemia   . Hypertension   . PONV (postoperative nausea and vomiting)   . Stroke [facility])     Past Surgical History:  Procedure Laterality Date  . CESAREAN SECTION     x 3  . ORIF WRIST FRACTURE Right 05/04/2018   Procedure: OPEN REDUCTION INTERNAL FIXATION (ORIF) RIGHT  WRIST FRACTURE;  Surgeon: Hiram Gash, MD;  Location: [facility];  Service: Orthopedics;  Laterality: Right;  . TUMOR REMOVAL     left shoulder    Vitals:   02/27/19 1136  BP: 140/80     Subjective Assessment - 02/27/19 1137    Subjective  Pt was participating in therapy at another facility after her CVA for about one year and was able to gain  enough independence with ADL and ambulation with hemi walker.  Pt feels like she has made all the progress she can make and decided to stop therapy. Husband feels that pt can make more progress with her R side and decided to pursue therapy for UE, LE and speech.    Patient is accompained by:  Family member    Pertinent History  HTN, hyperlipidemia, Diabetes, depression, COPD, bradycardia, falls with R wrist fracture requiring ORIF    How long can you walk comfortably?  household distances    Patient Stated Goals  To increase use of RUE and RLE    Currently in Pain?  No/denies         [facility] PT Assessment - 02/27/19 1147      Assessment   Medical Diagnosis  R hemiplegia as late effect of stroke    Referring Provider (PT)  Alycia Rossetti, MD    Onset Date/Surgical Date  --   May 2019   Hand Dominance  Right    Prior Therapy  yes      Precautions   Precautions  Fall;Other (comment)    Precaution Comments  : HTN, hyperlipidemia, Diabetes, depression, COPD, bradycardia, falls with R wrist fracture requiring ORIF  Restrictions   Weight Bearing Restrictions  No      Balance Screen   Has the patient fallen in the past 6 months  Yes    How many times?  2; one with RUE fracture      [place] residence    Living Arrangements  Spouse/significant other    Type of [place] entrance   + 1 step; walks and rides w/c up ramp   Home Layout  Two level;Bed/bath upstairs;Able to live on main level with bedroom/bathroom    Home Equipment  Other (comment);Transport chair;Wheelchair - [employer];Shower seat   hemi walker   Additional Comments  stair lift to go upstairs; pt's bedroom is downstairs      Prior Function   Level of Independence  Independent with transfers;Requires assistive device for independence;Independent with basic ADLs;Independent with household mobility with device;Independent with homemaking with  ambulation    Leisure  making candy jars      Observation/Other Assessments   Focus on Therapeutic Outcomes (FOTO)   Not assessed      Sensation   Light Touch  Impaired by gross assessment    Additional Comments  R Big toe is numb; difficulty with L/R discrimination      Posture/Postural Control   Posture/Postural Control  Postural limitations    Postural Limitations  Weight shift left      Tone   Assessment Location  Right Lower Extremity      ROM / Strength   AROM / PROM / Strength  Strength      Strength   Overall Strength  Deficits    Overall Strength Comments  LLE: 4-/5 hip flexion, 4+/5 knee flexion/extension and ankle DF.  RLE: 2/5 hip flexion and knee extension, 1/5 knee flexion and ankle PF.  0/5 ankle DF      Ambulation/Gait   Ambulation/Gait  Yes    Ambulation/Gait Assistance  4: Min assist    Ambulation/Gait Assistance Details  mild SOB with gait short distances    Ambulation Distance (Feet)  40 Feet    Assistive device  Hemi-walker    Gait Pattern  Step-to pattern;Decreased step length - right;Decreased stance time - right;Decreased stride length;Decreased hip/knee flexion - right;Decreased dorsiflexion - right;Decreased weight shift to left;Right hip hike;Right foot flat;Trunk flexed;Poor foot clearance - right   clears RLE through L ankle PF/vaulting   Ambulation Surface  Level;Indoor      Standardized Balance Assessment   Standardized Balance Assessment  Berg Balance Test;Five Times Sit to Stand;10 meter walk test    Five times sit to stand comments   22.72 seconds from arm chair using LUE, leaning to L, no WB or use of RLE    10 Meter Walk  1.49 or 0.30 ft/sec      Berg Balance Test   Sit to Stand  Able to stand  independently using hands    Standing Unsupported  Able to stand 2 minutes with supervision    Sitting with Back Unsupported but Feet Supported on Floor or Stool  Able to sit safely and securely 2 minutes    Stand to Sit  Controls descent by using  hands    Transfers  Able to transfer with verbal cueing and /or supervision    Standing Unsupported with Eyes Closed  Able to stand 10 seconds with supervision    Standing Unsupported with Feet Together  Needs help to  attain position but able to stand for 30 seconds with feet together    From Standing, Reach Forward with Outstretched Arm  Reaches forward but needs supervision    From Standing Position, Pick up Object from Floor  Unable to try/needs assist to keep balance    From Standing Position, Turn to Look Behind Over each Shoulder  Needs supervision when turning    Turn 360 Degrees  Needs assistance while turning    Standing Unsupported, Alternately Place Feet on Step/Stool  Needs assistance to keep from falling or unable to try    Standing Unsupported, One Foot in [place] help to step but can hold 15 seconds    Standing on One Leg  Unable to try or needs assist to prevent fall    Total Score  22    Berg comment:  22/56      RLE Tone   RLE Tone  Mild;Hypertonic;Modified Ashworth      RLE Tone   Modified Ashworth Scale for Grading Hypertonia RLE  Slight increase in muscle tone, manifested by a catch and release or by minimal resistance at the end of the range of motion when the affected part(s) is moved in flexion or extension                Objective measurements completed on examination: See above findings.              PT Education - 02/28/19 0933    Education Details  clinical findings, PT POC, goals, other treatment options, recommendation for OT eval and treat, recommendation to re-establish care with physiatrist and discuss use of Botox again for spasticity management    Person(s) Educated  Patient;Spouse    Methods  Explanation    Comprehension  Verbalized understanding       PT Short Term Goals - 02/28/19 0948      PT SHORT TERM GOAL #1   Title  Pt and husband will demonstrate compliance with initial HEP    Time  4    Period  Weeks    Status   New    Target Date  03/30/19      PT SHORT TERM GOAL #2   Title  Pt will improve RLE strength as indicated by five time sit to stand to </= 20 seconds from arm chair with use of UE but with more equal WB through bilat LE    Baseline  22.72 seconds from arm chair using LUE, leaning to L, no WB or use of RLE    Time  4    Period  Weeks    Target Date  03/30/19      PT SHORT TERM GOAL #3   Title  Pt will improve gait efficiency and gait velocity with hemi walker to >/= 0.8 ft/sec    Baseline  .30 ft/sec with hemi walker and no AFO    Time  4    Period  Weeks    Status  New    Target Date  03/30/19      PT SHORT TERM GOAL #4   Title  Pt will demonstrate decreased falls risk as indicated by increase in BERG score to >/= 26/56    Baseline  22/56    Time  4    Period  Weeks    Status  New    Target Date  03/30/19      PT SHORT TERM GOAL #5   Title  Pt will ambulate  x 75' over indoor surfaces with LRAD, appropriate AFO and negotiate ramp with supervision    Baseline  40' with min A and significant toe drag    Time  4    Period  Weeks    Status  New    Target Date  03/30/19        PT Long Term Goals - 02/28/19 0954      PT LONG TERM GOAL #1   Title  Pt and husband will be independent with final HEP    Time  8    Period  Weeks    Status  New    Target Date  04/29/19      PT LONG TERM GOAL #2   Title  Pt will demonstrate improved RLE strength by one mm grade (MMT)    Baseline  RLE: 2/5 hip flexion and knee extension, 1/5 knee flexion and ankle PF.  0/5 ankle DF    Time  8    Period  Weeks    Status  New    Target Date  04/29/19      PT LONG TERM GOAL #3   Title  Pt will improve functional LE strength as indicated by decrease in five time sit to stand to </= 18 seconds with use of UE from arm chair and more equal WB through bilat LE    Time  8    Period  Weeks    Status  New    Target Date  04/29/19      PT LONG TERM GOAL #4   Title  Pt will improve gait velocity to  >/= 1.2 ft/sec with LRAD and appropriate AFO    Time  8    Period  Weeks    Status  New    Target Date  04/29/19      PT LONG TERM GOAL #5   Title  Will improve BERG to >/= 30/56    Time  8    Period  Weeks    Status  New    Target Date  04/29/19      Additional Long Term Goals   Additional Long Term Goals  Yes      PT LONG TERM GOAL #6   Title  Pt will ambulate x 115' over indoor, level surfaces with LRAD and appropriate AFO with supervision and negotiate 4 stairs with one rail with Min A    Time  8    Period  Weeks    Status  New    Target Date  04/29/19             Plan - 02/28/19 0935    Clinical Impression Statement  Pt is a 58 year old female referred to Neuro OPPT for evaluation of decreased mobility and R hemiplegia as late effect of L CVA in May of 2019.  Pt's PMH is significant for the following: HTN, hyperlipidemia, Diabetes, depression, COPD, bradycardia, falls with R wrist fracture requiring ORIF. The following deficits were noted during pt's exam: R hemiplegia and muscle weakness, R hypertonicity UE > LE, impaired sensation R foot, impaired postural control, balance, abnormal gait and decreased activity tolerance and endurance.  Pt's five time sit to stand, BERG and gait velocity scores indicate pt is at high risk for falls. Pt would benefit from skilled PT to address these impairments and functional limitations to maximize functional mobility independence and reduce falls risk.    Personal Factors and Comorbidities  Comorbidity 3+;Time since  onset of injury/illness/exacerbation    Comorbidities  HTN, hyperlipidemia, Diabetes, depression, COPD, bradycardia, falls with R wrist fracture requiring ORIF    Examination-Activity Limitations  Locomotion Level;Stairs;Stand;Transfers    Examination-Participation Restrictions  Cleaning;Meal Prep    Stability/Clinical Decision Making  Evolving/Moderate complexity    Clinical Decision Making  Moderate    Rehab Potential  Good     PT Frequency  2x / week    PT Duration  8 weeks    PT Treatment/Interventions  ADLs/Self Care Home Management;Aquatic Therapy;Electrical Stimulation;DME Instruction;Gait training;Stair training;Functional mobility training;Therapeutic activities;Therapeutic exercise;Balance training;Neuromuscular re-education;Patient/family education;Orthotic Fit/Training;Passive range of motion;Energy conservation    PT Next Visit Plan  Initiate stretching program/HEP; WB and NMR RLE.  Trial AFO.  Bioness set up on RLE.  Transfer and gait training.    Recommended Other Services  OT, aquatic, re-establish care with physiatrist for possible Botox    Consulted and Agree with Plan of Care  Patient;Family member/caregiver    Family Member Consulted  Husband      Patient will benefit from skilled therapeutic intervention in order to improve the following deficits and impairments:  Abnormal gait, Cardiopulmonary status limiting activity, Decreased activity tolerance, Decreased balance, Decreased cognition, Decreased endurance, Decreased mobility, Decreased range of motion, Decreased strength, Difficulty walking, Impaired sensation, Impaired tone, Impaired UE functional use, Postural dysfunction  Visit Diagnosis: 1. Spastic hemiplegia of right dominant side as late effect of cerebrovascular disease, unspecified cerebrovascular disease type ([place])   2. Repeated falls   3. Other abnormalities of gait and mobility   4. Other symptoms and signs involving the nervous system   5. Unsteadiness on feet   6. Muscle weakness (generalized)        Problem List Patient Active Problem List   Diagnosis Date Noted  . Hypokalemia   . Dysphagia, post-stroke   . Hemiparesis affecting right side as late effect of cerebrovascular accident (CVA) ([place])   . Dysarthria, post-stroke   . Gait disturbance, post-stroke   . Bradycardia   . Weakness 10/26/2017  . Cerebrovascular disease 10/18/2017  . COPD (chronic obstructive  pulmonary disease) ([place]) 02/18/2015  . Non compliance w medication regimen 06/17/2013  . Ganglion cyst 03/09/2012  . Anxiety 03/09/2012  . Essential hypertension, benign 12/17/2011  . Obesity, unspecified 12/17/2011  . Hyperlipidemia 12/17/2011  . Depression 12/17/2011  . Controlled diabetes mellitus type 2 with complications ([place]) 85/08/7739    Rico Junker, PT, DPT 02/28/19    10:04 AM    [facility] [address] [place], [place], [address] Phone: [phone]   Fax:  [phone]  Name: ZURIAH BORDAS MRN: [id number] Date of Birth: 05/28/1961

## 2019-02-28 NOTE — Telephone Encounter (Signed)
Hello Dr. Letta Pate, Kelly Gibson is a former patient of yours that was recently referred to outpatient Neuro for PT by her PCP (her stroke was in May 2019).  Her husband told me she had one round of Botox in her RUE and then they missed an appointment and then never followed up on it.  She is still experiencing significant spasticity and decreased function in her RUE.  Currently, her PCP only has her on Zanaflex.  I recommended to her and her husband that they re-establish care with you and possibly revisit the use of Botox injections in her RUE in combination with OT.  Would it be possible for them to just call and make an appointment with your office or would she need a new referral from her PCP since it has been a while since her last visit with you?  Please let me know what will be the best way to proceed.  Thank you, Rico Junker, PT, DPT 02/28/19    10:09 AM

## 2019-02-28 NOTE — Telephone Encounter (Signed)
Orders placed.

## 2019-03-06 ENCOUNTER — Ambulatory Visit: Payer: BC Managed Care – PPO | Admitting: Occupational Therapy

## 2019-03-06 ENCOUNTER — Ambulatory Visit: Payer: BC Managed Care – PPO | Admitting: Physical Therapy

## 2019-03-06 NOTE — Telephone Encounter (Signed)
Pt may call for appt without new referral since within 3 yrs

## 2019-03-08 ENCOUNTER — Encounter: Payer: BLUE CROSS/BLUE SHIELD | Admitting: Occupational Therapy

## 2019-03-08 ENCOUNTER — Ambulatory Visit: Payer: BLUE CROSS/BLUE SHIELD | Admitting: Physical Therapy

## 2019-03-08 ENCOUNTER — Ambulatory Visit: Payer: BC Managed Care – PPO | Admitting: Physical Therapy

## 2019-03-10 ENCOUNTER — Ambulatory Visit: Payer: BC Managed Care – PPO | Admitting: Physical Therapy

## 2019-03-10 ENCOUNTER — Ambulatory Visit: Payer: BC Managed Care – PPO

## 2019-03-15 ENCOUNTER — Telehealth: Payer: Self-pay | Admitting: Physical Therapy

## 2019-03-15 ENCOUNTER — Ambulatory Visit: Payer: BC Managed Care – PPO | Admitting: Physical Therapy

## 2019-03-15 NOTE — Telephone Encounter (Signed)
Pt scheduled for PT visit and today was her third no-show for PT and ST appointments.  Contacted patient's husband to discuss reason for no-show and if they are still interested in coming to therapy.  Husband states that the patient has been very resistant to coming due to messaging she received at a previous therapy clinic including, "after a year you won't make any further improvement,"  "you have made all the progress you are going to make."  Husband states that because of these messages, the patient does not see any reason to come to therapy.  Discussed with husband brain plasticity and benefit of seeking therapy at rehabilitation center that specializes in neurological interventions.  Also discussed that for therapy to be beneficial it requires buy in from the patient and a willingness to attend and perform HEP as prescribed by therapists.  Also discussed role of physiatrist for tone management to improve ROM and mobility.  Husband states he feels his wife could make further progress and he will continue to discuss it with her.  He requested to cancel her next two appointments in order to attempt to motivate the patient and will try to have her at the appointment on 8/27.  Appointments on 8/21 and 8/25 cancelled.    Rico Junker, PT, DPT 03/15/19    8:34 PM

## 2019-03-17 ENCOUNTER — Ambulatory Visit: Payer: BC Managed Care – PPO | Admitting: Physical Therapy

## 2019-03-17 ENCOUNTER — Ambulatory Visit: Payer: BC Managed Care – PPO

## 2019-03-21 ENCOUNTER — Ambulatory Visit: Payer: BC Managed Care – PPO | Admitting: Physical Therapy

## 2019-03-22 ENCOUNTER — Ambulatory Visit: Payer: BLUE CROSS/BLUE SHIELD | Admitting: Physical Therapy

## 2019-03-23 ENCOUNTER — Ambulatory Visit: Payer: BC Managed Care – PPO

## 2019-03-23 ENCOUNTER — Ambulatory Visit: Payer: BC Managed Care – PPO | Admitting: Physical Therapy

## 2019-03-28 ENCOUNTER — Ambulatory Visit: Payer: BC Managed Care – PPO

## 2019-03-28 ENCOUNTER — Ambulatory Visit: Payer: BC Managed Care – PPO | Attending: Family Medicine | Admitting: Physical Therapy

## 2019-03-28 ENCOUNTER — Telehealth: Payer: Self-pay

## 2019-03-28 NOTE — Therapy (Signed)
[facility] [address] [place], [place], [address] Phone: [phone]   Fax:  [phone]  Patient Details  Name: Kelly Gibson MRN: [id number] Date of Birth: December 29, 1960 Referring Provider:  Vic Blackbird, Wanda Plump MD  Encounter Date: 03/28/2019   SPEECH THERAPY DISCHARGE SUMMARY  Visits from Start of Care: one (eval)  Current functional level related to goals / functional outcomes: Pt came to eval and then no-showed the rest of her appointments. PT called pt husband on 03-15-19 and informed him of no-shows. He stated pt said she did not see the purpose in therapy. Husband was going to attempt to motivate pt to attend sessions on 03-23-19. Pt no-showed to those sessions. She no-showed again today. Due to no-show policy, pt will be d/c'd from ST at this time. Goals at time of d/c are below:  SLP Short Term Goals - 02/27/19 1635              SLP SHORT TERM GOAL #1    Title  pt will use compensations for processing speed with rare min A to do so     Time  4     Period  Weeks     Status  New          SLP SHORT TERM GOAL #2    Title  pt will report 2 compensations for memory to SLP with rare min A     Time  4     Period  Weeks     Status  New          SLP SHORT TERM GOAL #3    Title  pt or husband will tell SLP 3 overt s/s aspiration PNA with modified independence over two sessions     Time  4     Period  Weeks     Status  New          SLP SHORT TERM GOAL #4    Title  if pt desires updated HEP for dysarthria, complete dysarthria HEP with occasional min A over two sessions     Time  4     Period  Weeks     Status  New             SLP Long Term Goals - 02/27/19 1638              SLP LONG TERM GOAL #1    Title  pt will tell SLP 4 ways she can practically compensate in order to improve her memory skills, with occasional min A     Time  8     Period  Weeks   or 9 visits, for all LTGs    Status  New         SLP LONG TERM GOAL #2    Title  Pt will give exampels of how she is using compensatory strategies for processing speed between 3 sessions     Time  8     Period  Weeks     Status  New          SLP LONG TERM GOAL #3    Title  pt will tell SLP/demo how she is compensating for decr'd memory skills between 3 sessions     Time  8     Period  Weeks     Status  New          SLP LONG TERM GOAL #4    Title  pt will report better QOL compared to evaluation day, regarding processing speed     Time  8     Period  Weeks     Status  New       Remaining deficits: All deficits ID'd on 02-28-19 remain.   Education / Equipment: See evaluation.   Plan: Patient agrees to discharge.  Patient goals were not met. Patient is being discharged due to not returning since the last visit.  ?????       [place] ,[place], [name]  03/28/2019, 8:41 AM  [facility] [address] [place], [place], [address] Phone: [phone]   Fax:  [phone]

## 2019-03-28 NOTE — Telephone Encounter (Signed)
noted 

## 2019-03-28 NOTE — Telephone Encounter (Signed)
SLP called and spoke to pt, and informed her that her PT had called in the past two weeks and had talked to pt's husband about the importance of attendance to therapy sessions. Due to no-shows, pt will be d/c'd at this time.  Pt was also told if she desires further therapy all we would need would be a prescription from her MD. She acknowledged understanding of this.  Garald Balding, MS, SLP

## 2019-03-30 ENCOUNTER — Ambulatory Visit: Payer: BC Managed Care – PPO | Admitting: Physical Therapy

## 2019-03-31 ENCOUNTER — Encounter: Payer: Self-pay | Admitting: Physical Therapy

## 2019-03-31 NOTE — Therapy (Signed)
[facility] [address] [place], [place], [address] Phone: [phone]   Fax:  [phone]  Patient Details  Name: Kelly Gibson MRN: [id number] Date of Birth: 03-Oct-1960 Referring Provider:  No ref. provider found  Encounter Date: 03/31/2019  PHYSICAL THERAPY DISCHARGE SUMMARY  Visits from Start of Care: 1 - eval only   Current functional level related to goals / functional outcomes: Unable to assess; pt did not return after initial evaluation.  Due to multiple no shows therapist contacted pt's husband as documented in telephone encounter.  Husband to attempt to motivate pt to attend therapy.  Pt no showed x 2 more visits, SLP contacted pt and discussed no show policy and that pt would be D/C at this time.  If pt feels she would like to give therapy another try, pt advised to seek a new referral from physician.   Remaining deficits: Impaired strength, balance, gait, increased falls risk   Education / Equipment: N/A - eval only  Plan: Patient agrees to discharge.  Patient goals were not met. Patient is being discharged due to not returning since the last visit.  ?????     Rico Junker, PT, DPT 03/31/19    1:06 PM    [facility] [address] [place], [place], [address] Phone: [phone]   Fax:  [phone]

## 2019-04-04 ENCOUNTER — Ambulatory Visit: Payer: BC Managed Care – PPO | Admitting: Physical Therapy

## 2019-04-07 ENCOUNTER — Ambulatory Visit: Payer: BC Managed Care – PPO | Admitting: Physical Therapy

## 2019-04-07 ENCOUNTER — Ambulatory Visit: Payer: BC Managed Care – PPO

## 2019-04-10 ENCOUNTER — Other Ambulatory Visit: Payer: Self-pay

## 2019-04-11 ENCOUNTER — Ambulatory Visit (INDEPENDENT_AMBULATORY_CARE_PROVIDER_SITE_OTHER): Payer: BC Managed Care – PPO | Admitting: Family Medicine

## 2019-04-11 ENCOUNTER — Encounter: Payer: Self-pay | Admitting: Family Medicine

## 2019-04-11 VITALS — BP 130/62 | HR 62 | Temp 98.1°F | Resp 18 | Wt 171.2 lb

## 2019-04-11 DIAGNOSIS — L309 Dermatitis, unspecified: Secondary | ICD-10-CM

## 2019-04-11 DIAGNOSIS — J410 Simple chronic bronchitis: Secondary | ICD-10-CM

## 2019-04-11 DIAGNOSIS — I10 Essential (primary) hypertension: Secondary | ICD-10-CM

## 2019-04-11 DIAGNOSIS — Q845 Enlarged and hypertrophic nails: Secondary | ICD-10-CM

## 2019-04-11 DIAGNOSIS — E118 Type 2 diabetes mellitus with unspecified complications: Secondary | ICD-10-CM

## 2019-04-11 DIAGNOSIS — E785 Hyperlipidemia, unspecified: Secondary | ICD-10-CM | POA: Diagnosis not present

## 2019-04-11 DIAGNOSIS — Z23 Encounter for immunization: Secondary | ICD-10-CM

## 2019-04-11 MED ORDER — CLOTRIMAZOLE-BETAMETHASONE 1-0.05 % EX CREA: 1.0000 "application " | TOPICAL_CREAM | Freq: Two times a day (BID) | CUTANEOUS | 0 refills | Status: DC

## 2019-04-11 NOTE — Progress Notes (Signed)
Subjective:    Patient ID: Kelly Gibson, female    DOB: 11/25/1960, 58 y.o.   MRN: [id number]  Patient presents for Follow-up (is fasting)  Pt here to f/u chronic medical problems  Medications reviewed  From our last visit she was referred to speech therapy/ Occupational therapy  but no showed too many times . she states that she did not want to go back if she did not feel like she could benefit from it. She is able to get around her home using a walker or cane at times.  She does require help with bathing but otherwise is able to do most things by herself  HTN-taking lisinopril 5 mg daily along with amlodipine without any difficulty blood pressure is been good at home.  History of Stroke in 2019- on crestor, takes zanaflex for spasms as needed, also on ASA 325mg    DM- last AA1C 7% in June. Taking glyburide  2.5mg  BID   CKD, on ACEI LISINOPRIL 5mg   The meter in the office fasting blood sugars have ranged from low 100s to 150s especially she eat something high carb and not before.  No hypoglycemia symptoms.   He does have a rash behind both ears that is very itchy the past few weeks.      Review Of Systems:  GEN- denies fatigue, fever, weight loss,weakness, recent illness HEENT- denies eye drainage, change in vision, nasal discharge, CVS- denies chest pain, palpitations RESP- denies SOB, cough, wheeze ABD- denies N/V, change in stools, abd pain GU- denies dysuria, hematuria, dribbling, incontinence MSK- denies joint pain, muscle aches, injury Neuro- denies headache, dizziness, syncope, seizure activity       Objective:    BP 130/62   Pulse 62   Temp 98.1 F (36.7 C) (Oral)   Resp 18   Wt 171 lb 3.2 oz (77.7 kg) Comment: in wheelchair  LMP 12/10/2012   SpO2 99%   BMI 30.33 kg/m  GEN- NAD, alert and oriented x3, IN WHEELCHAIR  HEENT- PERRL, EOMI, non injected sclera, pink conjunctiva, MMM, oropharynx clear TM clear bilat, mild erythema with flaking behind both  ears  Neck- Supple, no thyromegaly CVS- RRR, no murmur RESP-CTAB ABD-NABS,soft,NT,ND EXT- No edema, decreased tone RLE Pulses- Radial, DP- 2+        Assessment & Plan:      Problem List Items Addressed This Visit      Unprioritized   Controlled diabetes mellitus type 2 with complications ([place])   Relevant Orders   Hemoglobin A1c   HM DIABETES FOOT EXAM (Completed)   Ambulatory referral to Podiatry   Ambulatory referral to Ophthalmology   COPD (chronic obstructive pulmonary disease) ([place])    Again recommend that she use her Incruse every single day she has not been doing so.  Flu shot was given.  Albuterol on hand for exacerbations.      Essential hypertension, benign - Primary    Pressure is controlled no change in medications.  We will recheck her kidney function.  We will also check her A1c goal is A1c less than 7%.  She is on statin drug aspirin and ACE inhibitor.  She does need to see podiatry she has very long thick nails.  She also needs seen ophthalmologist which was arranged both of these referrals.      Relevant Orders   CBC with Differential/Platelet   Comprehensive metabolic panel   Hyperlipidemia    Other Visit Diagnoses    Need for immunization against influenza  Relevant Orders   Flu Vaccine QUAD 36+ mos IM (Completed)   Dermatitis       Topical Lotrisone I think this might be a little fungal mixed with a dermatitis in the setting of her diabetes   Enlarged and hypertrophic nails       Relevant Orders   Ambulatory referral to Podiatry      Note: This dictation was prepared with Dragon dictation along with smaller phrase technology. Any transcriptional errors that result from this process are unintentional.

## 2019-04-11 NOTE — Assessment & Plan Note (Signed)
Pressure is controlled no change in medications.  We will recheck her kidney function.  We will also check her A1c goal is A1c less than 7%.  She is on statin drug aspirin and ACE inhibitor.  She does need to see podiatry she has very long thick nails.  She also needs seen ophthalmologist which was arranged both of these referrals.

## 2019-04-11 NOTE — Assessment & Plan Note (Signed)
Again recommend that she use her Incruse every single day she has not been doing so.  Flu shot was given.  Albuterol on hand for exacerbations.

## 2019-04-11 NOTE — Patient Instructions (Addendum)
F/U 3 months for Physical  Podiatry referral Ophthalmology  Flu shot given

## 2019-04-11 NOTE — Assessment & Plan Note (Signed)
>>  ASSESSMENT AND PLAN FOR ESSENTIAL HYPERTENSION, BENIGN WRITTEN ON 04/11/2019 10:20 AM BY [place], KAWANTA F  Pressure is controlled no change in medications.  We will recheck her kidney function.  We will also check her A1c goal is A1c less than 7%.  She is on statin drug aspirin and ACE inhibitor.  She does need to see podiatry she has very long thick nails.  She also needs seen ophthalmologist which was arranged both of these referrals.

## 2019-04-12 LAB — COMPREHENSIVE METABOLIC PANEL
AG Ratio: 1.4 (calc) (ref 1.0–2.5)
ALT: 8 U/L (ref 6–29)
AST: 10 U/L (ref 10–35)
Albumin: 4.2 g/dL (ref 3.6–5.1)
Alkaline phosphatase (APISO): 90 U/L (ref 37–153)
BUN/Creatinine Ratio: 13 (calc) (ref 6–22)
BUN: 23 mg/dL (ref 7–25)
CO2: 24 mmol/L (ref 20–32)
Calcium: 9.6 mg/dL (ref 8.6–10.4)
Chloride: 107 mmol/L (ref 98–110)
Creat: 1.81 mg/dL — ABNORMAL HIGH (ref 0.50–1.05)
Globulin: 3.1 g/dL (calc) (ref 1.9–3.7)
Glucose, Bld: 137 mg/dL — ABNORMAL HIGH (ref 65–99)
Potassium: 3.8 mmol/L (ref 3.5–5.3)
Sodium: 140 mmol/L (ref 135–146)
Total Bilirubin: 0.4 mg/dL (ref 0.2–1.2)
Total Protein: 7.3 g/dL (ref 6.1–8.1)

## 2019-04-12 LAB — CBC WITH DIFFERENTIAL/PLATELET
Absolute Monocytes: 745 cells/uL (ref 200–950)
Basophils Absolute: 61 cells/uL (ref 0–200)
Basophils Relative: 0.8 %
Eosinophils Absolute: 289 cells/uL (ref 15–500)
Eosinophils Relative: 3.8 %
HCT: 38.4 % (ref 35.0–45.0)
Hemoglobin: 12.9 g/dL (ref 11.7–15.5)
Lymphs Abs: 2189 cells/uL (ref 850–3900)
MCH: 28.5 pg (ref 27.0–33.0)
MCHC: 33.6 g/dL (ref 32.0–36.0)
MCV: 85 fL (ref 80.0–100.0)
MPV: 10.3 fL (ref 7.5–12.5)
Monocytes Relative: 9.8 %
Neutro Abs: 4317 cells/uL (ref 1500–7800)
Neutrophils Relative %: 56.8 %
Platelets: 331 10*3/uL (ref 140–400)
RBC: 4.52 10*6/uL (ref 3.80–5.10)
RDW: 13.8 % (ref 11.0–15.0)
Total Lymphocyte: 28.8 %
WBC: 7.6 10*3/uL (ref 3.8–10.8)

## 2019-04-12 LAB — HEMOGLOBIN A1C
Hgb A1c MFr Bld: 7.5 % of total Hgb — ABNORMAL HIGH (ref <5.7)
Mean Plasma Glucose: 169 (calc)
eAG (mmol/L): 9.3 (calc)

## 2019-04-13 ENCOUNTER — Other Ambulatory Visit: Payer: Self-pay | Admitting: *Deleted

## 2019-04-13 DIAGNOSIS — N179 Acute kidney failure, unspecified: Secondary | ICD-10-CM

## 2019-04-13 MED ORDER — GLYBURIDE 5 MG PO TABS: 5.0000 mg | ORAL_TABLET | Freq: Two times a day (BID) | ORAL | 3 refills | Status: DC

## 2019-04-27 ENCOUNTER — Other Ambulatory Visit: Payer: BC Managed Care – PPO

## 2019-04-27 ENCOUNTER — Other Ambulatory Visit: Payer: Self-pay

## 2019-04-27 ENCOUNTER — Telehealth: Payer: Self-pay | Admitting: *Deleted

## 2019-04-27 DIAGNOSIS — N179 Acute kidney failure, unspecified: Secondary | ICD-10-CM

## 2019-04-27 NOTE — Telephone Encounter (Signed)
Patient seen in office today to have labs obtained.   Requested to add Iron panel.   Ok to order?

## 2019-04-28 ENCOUNTER — Other Ambulatory Visit: Payer: Self-pay

## 2019-04-28 ENCOUNTER — Inpatient Hospital Stay (HOSPITAL_COMMUNITY)
Admission: EM | Admit: 2019-04-28 | Discharge: 2019-05-02 | DRG: 683 | Disposition: A | Discharge status: Home / Self Care | Payer: BC Managed Care – PPO | Attending: Internal Medicine | Admitting: Internal Medicine

## 2019-04-28 ENCOUNTER — Encounter (HOSPITAL_COMMUNITY): Payer: Self-pay | Admitting: Emergency Medicine

## 2019-04-28 ENCOUNTER — Inpatient Hospital Stay (HOSPITAL_COMMUNITY): Payer: BC Managed Care – PPO

## 2019-04-28 DIAGNOSIS — Z7982 Long term (current) use of aspirin: Secondary | ICD-10-CM

## 2019-04-28 DIAGNOSIS — Z79899 Other long term (current) drug therapy: Secondary | ICD-10-CM

## 2019-04-28 DIAGNOSIS — Z8249 Family history of ischemic heart disease and other diseases of the circulatory system: Secondary | ICD-10-CM

## 2019-04-28 DIAGNOSIS — Z20828 Contact with and (suspected) exposure to other viral communicable diseases: Secondary | ICD-10-CM | POA: Diagnosis present

## 2019-04-28 DIAGNOSIS — I69351 Hemiplegia and hemiparesis following cerebral infarction affecting right dominant side: Secondary | ICD-10-CM | POA: Diagnosis not present

## 2019-04-28 DIAGNOSIS — Z888 Allergy status to other drugs, medicaments and biological substances status: Secondary | ICD-10-CM | POA: Diagnosis not present

## 2019-04-28 DIAGNOSIS — N170 Acute kidney failure with tubular necrosis: Secondary | ICD-10-CM | POA: Diagnosis present

## 2019-04-28 DIAGNOSIS — I129 Hypertensive chronic kidney disease with stage 1 through stage 4 chronic kidney disease, or unspecified chronic kidney disease: Secondary | ICD-10-CM | POA: Diagnosis present

## 2019-04-28 DIAGNOSIS — R338 Other retention of urine: Secondary | ICD-10-CM | POA: Diagnosis not present

## 2019-04-28 DIAGNOSIS — E669 Obesity, unspecified: Secondary | ICD-10-CM | POA: Diagnosis present

## 2019-04-28 DIAGNOSIS — R339 Retention of urine, unspecified: Secondary | ICD-10-CM | POA: Diagnosis not present

## 2019-04-28 DIAGNOSIS — I10 Essential (primary) hypertension: Secondary | ICD-10-CM | POA: Diagnosis present

## 2019-04-28 DIAGNOSIS — J441 Chronic obstructive pulmonary disease with (acute) exacerbation: Secondary | ICD-10-CM | POA: Diagnosis present

## 2019-04-28 DIAGNOSIS — E876 Hypokalemia: Secondary | ICD-10-CM | POA: Diagnosis present

## 2019-04-28 DIAGNOSIS — N179 Acute kidney failure, unspecified: Secondary | ICD-10-CM | POA: Diagnosis present

## 2019-04-28 DIAGNOSIS — Z833 Family history of diabetes mellitus: Secondary | ICD-10-CM

## 2019-04-28 DIAGNOSIS — E1159 Type 2 diabetes mellitus with other circulatory complications: Secondary | ICD-10-CM | POA: Diagnosis present

## 2019-04-28 DIAGNOSIS — J449 Chronic obstructive pulmonary disease, unspecified: Secondary | ICD-10-CM | POA: Diagnosis present

## 2019-04-28 DIAGNOSIS — N319 Neuromuscular dysfunction of bladder, unspecified: Secondary | ICD-10-CM | POA: Diagnosis present

## 2019-04-28 DIAGNOSIS — J410 Simple chronic bronchitis: Secondary | ICD-10-CM | POA: Diagnosis not present

## 2019-04-28 DIAGNOSIS — E1165 Type 2 diabetes mellitus with hyperglycemia: Secondary | ICD-10-CM

## 2019-04-28 DIAGNOSIS — Z823 Family history of stroke: Secondary | ICD-10-CM | POA: Diagnosis not present

## 2019-04-28 DIAGNOSIS — E785 Hyperlipidemia, unspecified: Secondary | ICD-10-CM | POA: Diagnosis present

## 2019-04-28 DIAGNOSIS — E1140 Type 2 diabetes mellitus with diabetic neuropathy, unspecified: Secondary | ICD-10-CM

## 2019-04-28 DIAGNOSIS — R0602 Shortness of breath: Secondary | ICD-10-CM

## 2019-04-28 DIAGNOSIS — Z794 Long term (current) use of insulin: Secondary | ICD-10-CM

## 2019-04-28 DIAGNOSIS — Z87891 Personal history of nicotine dependence: Secondary | ICD-10-CM | POA: Diagnosis not present

## 2019-04-28 DIAGNOSIS — E119 Type 2 diabetes mellitus without complications: Secondary | ICD-10-CM | POA: Diagnosis present

## 2019-04-28 LAB — BASIC METABOLIC PANEL
BUN/Creatinine Ratio: 8 (calc) (ref 6–22)
BUN: 38 mg/dL — ABNORMAL HIGH (ref 7–25)
CO2: 20 mmol/L (ref 20–32)
Calcium: 9.5 mg/dL (ref 8.6–10.4)
Chloride: 105 mmol/L (ref 98–110)
Creat: 4.81 mg/dL — ABNORMAL HIGH (ref 0.50–1.05)
Glucose, Bld: 107 mg/dL — ABNORMAL HIGH (ref 65–99)
Potassium: 3.6 mmol/L (ref 3.5–5.3)
Sodium: 140 mmol/L (ref 135–146)

## 2019-04-28 LAB — CBG MONITORING, ED: Glucose-Capillary: 149 mg/dL — ABNORMAL HIGH (ref 70–99)

## 2019-04-28 LAB — URINALYSIS, ROUTINE W REFLEX MICROSCOPIC
Bacteria, UA: NONE SEEN
Bilirubin Urine: NEGATIVE
Glucose, UA: NEGATIVE mg/dL
Hgb urine dipstick: NEGATIVE
Ketones, ur: NEGATIVE mg/dL
Leukocytes,Ua: NEGATIVE
Nitrite: NEGATIVE
Protein, ur: 100 mg/dL — AB
Specific Gravity, Urine: 1.016 (ref 1.005–1.030)
pH: 5 (ref 5.0–8.0)

## 2019-04-28 LAB — COMPREHENSIVE METABOLIC PANEL
ALT: 11 U/L (ref 0–44)
AST: 17 U/L (ref 15–41)
Albumin: 4 g/dL (ref 3.5–5.0)
Alkaline Phosphatase: 103 U/L (ref 38–126)
Anion gap: 14 (ref 5–15)
BUN: 33 mg/dL — ABNORMAL HIGH (ref 6–20)
CO2: 21 mmol/L — ABNORMAL LOW (ref 22–32)
Calcium: 9.4 mg/dL (ref 8.9–10.3)
Chloride: 105 mmol/L (ref 98–111)
Creatinine, Ser: 4.22 mg/dL — ABNORMAL HIGH (ref 0.44–1.00)
GFR calc Af Amer: 13 mL/min — ABNORMAL LOW (ref >60)
GFR calc non Af Amer: 11 mL/min — ABNORMAL LOW (ref >60)
Glucose, Bld: 191 mg/dL — ABNORMAL HIGH (ref 70–99)
Potassium: 3.3 mmol/L — ABNORMAL LOW (ref 3.5–5.1)
Sodium: 140 mmol/L (ref 135–145)
Total Bilirubin: 0.4 mg/dL (ref 0.3–1.2)
Total Protein: 7.7 g/dL (ref 6.5–8.1)

## 2019-04-28 LAB — CBC WITH DIFFERENTIAL/PLATELET
Abs Immature Granulocytes: 0.03 10*3/uL (ref 0.00–0.07)
Basophils Absolute: 0.1 10*3/uL (ref 0.0–0.1)
Basophils Relative: 1 %
Eosinophils Absolute: 0.2 10*3/uL (ref 0.0–0.5)
Eosinophils Relative: 3 %
HCT: 41.3 % (ref 36.0–46.0)
Hemoglobin: 13.6 g/dL (ref 12.0–15.0)
Immature Granulocytes: 1 %
Lymphocytes Relative: 22 %
Lymphs Abs: 1.4 10*3/uL (ref 0.7–4.0)
MCH: 28.3 pg (ref 26.0–34.0)
MCHC: 32.9 g/dL (ref 30.0–36.0)
MCV: 85.9 fL (ref 80.0–100.0)
Monocytes Absolute: 0.6 10*3/uL (ref 0.1–1.0)
Monocytes Relative: 9 %
Neutro Abs: 4.1 10*3/uL (ref 1.7–7.7)
Neutrophils Relative %: 64 %
Platelets: 352 10*3/uL (ref 150–400)
RBC: 4.81 MIL/uL (ref 3.87–5.11)
RDW: 13.1 % (ref 11.5–15.5)
WBC: 6.4 10*3/uL (ref 4.0–10.5)
nRBC: 0 % (ref 0.0–0.2)

## 2019-04-28 MED ORDER — ASPIRIN EC 81 MG PO TBEC
81.0000 mg | DELAYED_RELEASE_TABLET | Freq: Every day | ORAL | Status: DC
  Administered 2019-04-28 – 2019-05-02 (×5): 81 mg via ORAL
  Filled 2019-04-28 (×5): qty 1

## 2019-04-28 MED ORDER — UMECLIDINIUM BROMIDE 62.5 MCG/INH IN AEPB
1.0000 | INHALATION_SPRAY | Freq: Every day | RESPIRATORY_TRACT | Status: DC
  Administered 2019-05-01 – 2019-05-02 (×2): 1 via RESPIRATORY_TRACT
  Filled 2019-04-28: qty 7

## 2019-04-28 MED ORDER — ONDANSETRON HCL 4 MG PO TABS
4.0000 mg | ORAL_TABLET | Freq: Four times a day (QID) | ORAL | Status: DC | PRN
  Administered 2019-04-30: 19:00:00 4 mg via ORAL
  Filled 2019-04-28: qty 1

## 2019-04-28 MED ORDER — ROSUVASTATIN CALCIUM 20 MG PO TABS
40.0000 mg | ORAL_TABLET | Freq: Every day | ORAL | Status: DC
  Administered 2019-04-29 – 2019-05-01 (×4): 40 mg via ORAL
  Filled 2019-04-28 (×4): qty 2

## 2019-04-28 MED ORDER — SODIUM CHLORIDE 0.9 % IV SOLN
INTRAVENOUS | Status: DC
  Administered 2019-04-28: 19:00:00 via INTRAVENOUS
  Administered 2019-04-29: 100 mL/h via INTRAVENOUS
  Administered 2019-04-29 – 2019-05-01 (×4): via INTRAVENOUS

## 2019-04-28 MED ORDER — ACETAMINOPHEN 325 MG PO TABS
650.0000 mg | ORAL_TABLET | Freq: Four times a day (QID) | ORAL | Status: DC | PRN
  Administered 2019-04-30: 650 mg via ORAL
  Filled 2019-04-28: qty 2

## 2019-04-28 MED ORDER — ALBUTEROL SULFATE HFA 108 (90 BASE) MCG/ACT IN AERS
2.0000 | INHALATION_SPRAY | Freq: Four times a day (QID) | RESPIRATORY_TRACT | Status: DC | PRN
  Administered 2019-04-29 – 2019-05-01 (×3): 2 via RESPIRATORY_TRACT
  Filled 2019-04-28: qty 6.7

## 2019-04-28 MED ORDER — ENOXAPARIN SODIUM 30 MG/0.3ML ~~LOC~~ SOLN
30.0000 mg | SUBCUTANEOUS | Status: DC
  Administered 2019-04-29 – 2019-05-02 (×4): 30 mg via SUBCUTANEOUS
  Filled 2019-04-28 (×4): qty 0.3

## 2019-04-28 MED ORDER — INSULIN ASPART 100 UNIT/ML ~~LOC~~ SOLN
0.0000 [IU] | Freq: Three times a day (TID) | SUBCUTANEOUS | Status: DC
  Administered 2019-04-29: 3 [IU] via SUBCUTANEOUS
  Administered 2019-04-29 – 2019-04-30 (×2): 2 [IU] via SUBCUTANEOUS

## 2019-04-28 MED ORDER — SODIUM CHLORIDE 0.9 % IV SOLN: 1000.0000 mL | INTRAVENOUS | Status: DC

## 2019-04-28 MED ORDER — ONDANSETRON HCL 4 MG/2ML IJ SOLN: 4.0000 mg | Freq: Four times a day (QID) | INTRAMUSCULAR | Status: DC | PRN

## 2019-04-28 MED ORDER — INSULIN ASPART 100 UNIT/ML ~~LOC~~ SOLN: 0.0000 [IU] | Freq: Every day | SUBCUTANEOUS | Status: DC

## 2019-04-28 MED ORDER — SODIUM CHLORIDE 0.9 % IV BOLUS (SEPSIS)
500.0000 mL | Freq: Once | INTRAVENOUS | Status: AC
  Administered 2019-04-28: 500 mL via INTRAVENOUS

## 2019-04-28 MED ORDER — ACETAMINOPHEN 650 MG RE SUPP: 650.0000 mg | Freq: Four times a day (QID) | RECTAL | Status: DC | PRN

## 2019-04-28 MED ORDER — AMLODIPINE BESYLATE 10 MG PO TABS
10.0000 mg | ORAL_TABLET | Freq: Every day | ORAL | Status: DC
  Administered 2019-04-28 – 2019-05-02 (×5): 10 mg via ORAL
  Filled 2019-04-28 (×2): qty 1
  Filled 2019-04-28: qty 2
  Filled 2019-04-28 (×2): qty 1

## 2019-04-28 NOTE — ED Notes (Signed)
Pt attempted to urinated, only was able to urinate 40 ml. Completed bladder scan, 302 ml left in bladder

## 2019-04-28 NOTE — ED Notes (Signed)
Ordered a renal carb modified dinner S/w [place]--Joh Rao

## 2019-04-28 NOTE — Telephone Encounter (Signed)
She does not have any anemia, so no reason for iron panel at this time

## 2019-04-28 NOTE — ED Provider Notes (Signed)
[facility] EM[facility]GENC[facility] DEPARTMENT Provider Note   CSN: [id number] Arrival date & time: 04/28/19  1253     History   Chief Complaint Chief Complaint  Patient presents with  . Abnormal Lab    HPI Kelly Gibson is a 58 y.o. female presenting for evaluation of abnormal labs.  Patient states she saw her PCP last week for a regular checkup.  She had labs drawn, and was told to come to the ER for an abnormality.  She cannot member what the abnormality was.  Patient states for the past month, she has been feeling "funny."  She is unable to elaborate any further.  She states intermittently she is feeling nauseous, this resolves only with time.  There is nothing she can do that brings nausea on or makes it resolved.  Patient states she has also been feeling like she is having abdominal bloating.  She denies fevers, chills, cough, chest pain, shortness of breath, abdominal pain, normal bowel movements.  Patient states sometimes she feels like she needs to urinate, but is unable to do so.  Otherwise she denies dysuria, hematuria, or urinary frequency.  She denies sick contacts.  She denies recent change in medications.  Patient states she does take Advil most nights before going to bed, is taking 600 mg at a time.  This is not new for her.  Additional history obtained from chart review.  Patient with a history of diabetes, hypertension, previous stroke, COPD.     HPI  Past Medical History:  Diagnosis Date  . Bradycardia    SR/SB with up to 3.5 second pause (asymptomatic) on ~ 10/2017 event monitor; saw EP Dr. Rayann Heman: avoid AV nodal blocking agents  . COPD (chronic obstructive pulmonary disease) ([place])   . Depression   . Diabetes mellitus   . Hyperlipidemia   . Hypertension   . PONV (postoperative nausea and vomiting)   . Stroke [facility])     Patient Active Problem List   Diagnosis Date Noted  . Hypokalemia   . Dysphagia, post-stroke   . Hemiparesis affecting right side  as late effect of cerebrovascular accident (CVA) ([place])   . Dysarthria, post-stroke   . Gait disturbance, post-stroke   . Bradycardia   . Weakness 10/26/2017  . Cerebrovascular disease 10/18/2017  . COPD (chronic obstructive pulmonary disease) ([place]) 02/18/2015  . Non compliance w medication regimen 06/17/2013  . Ganglion cyst 03/09/2012  . Anxiety 03/09/2012  . Essential hypertension, benign 12/17/2011  . Obesity, unspecified 12/17/2011  . Hyperlipidemia 12/17/2011  . Depression 12/17/2011  . Controlled diabetes mellitus type 2 with complications ([place]) [id number]    Past Surgical History:  Procedure Laterality Date  . CESAREAN SECTION     x 3  . ORIF WRIST FRACTURE Right 05/04/2018   Procedure: OPEN REDUCTION INTERNAL FIXATION (ORIF) RIGHT  WRIST FRACTURE;  Surgeon: Hiram Gash, MD;  Location: [facility];  Service: Orthopedics;  Laterality: Right;  . TUMOR REMOVAL     left shoulder     OB History   No obstetric history on file.      Home Medications    Prior to Admission medications   Medication Sig Start Date End Date Taking? Authorizing Provider  albuterol (VENTOLIN HFA) 108 (90 Base) MCG/ACT inhaler Inhale 2 puffs into the lungs every 6 (six) hours as needed for wheezing or shortness of breath. 01/09/19  Yes [place], Modena Nunnery, MD  amLODipine (NORVASC) 10 MG tablet Take 1 tablet (10 mg total) by  mouth daily. Patient taking differently: Take 10 mg by mouth at bedtime.  01/09/19  Yes [place], Modena Nunnery, MD  aspirin EC 325 MG EC tablet Take 1 tablet (325 mg total) by mouth daily. 12/10/17  Yes Geradine Girt, DO  clotrimazole-betamethasone (LOTRISONE) cream Apply 1 application topically 2 (two) times daily. Patient taking differently: Apply 1 application topically 2 (two) times daily. BEHIND THE EARS 04/11/19  Yes [place], Modena Nunnery, MD  glyBURIDE (DIABETA) 5 MG tablet Take 1 tablet (5 mg total) by mouth 2 (two) times daily with a meal. 04/13/19  Yes [place], Modena Nunnery, MD  ibuprofen  (ADVIL) 200 MG tablet Take 200-400 mg by mouth every 6 (six) hours as needed for headache or mild pain.   Yes [provider]  rosuvastatin (CRESTOR) 40 MG tablet TAKE ONE TABLET BY MOUTH DAILY AT 6PM Patient taking differently: Take 40 mg by mouth at bedtime.  01/09/19  Yes [place], Modena Nunnery, MD  tiZANidine (ZANAFLEX) 4 MG tablet Take 1 tablet (4 mg total) by mouth every 6 (six) hours as needed for muscle spasms. 01/09/19  Yes [place], Modena Nunnery, MD  umeclidinium bromide (INCRUSE ELLIPTA) 62.5 MCG/INH AEPB Inhale 1 puff into the lungs daily. 01/09/19  Yes , Modena Nunnery, MD  Glucose Blood (BLOOD GLUCOSE TEST STRIPS) STRP Use as directed to monitor FSBS 2x daily. Dx: E11.9. 01/04/19   Alycia Rossetti, MD    Family History Family History  Problem Relation Age of Onset  . Diabetes Mother   . Stroke Mother   . Heart disease Father   . Cancer Father   . Stroke Father     Social History Social History   Tobacco Use  . Smoking status: Former Smoker    Packs/day: 1.00    Years: 20.00    Pack years: 20.00  . Smokeless tobacco: Never Used  . Tobacco comment: stopped 2018  Substance Use Topics  . Alcohol use: No  . Drug use: No     Allergies   Niacin and related   Review of Systems Review of Systems  Gastrointestinal: Positive for abdominal distention (Bloating).  Genitourinary: Positive for difficulty urinating.  All other systems reviewed and are negative.    Physical Exam Updated Vital Signs BP (!) 154/82   Pulse 85   Temp 98.6 F (37 C) (Oral)   Resp 18   LMP 12/10/2012   SpO2 100%   Physical Exam Vitals signs and nursing note reviewed.  Constitutional:      General: She is not in acute distress.    Appearance: She is well-developed.     Comments: Appears nontoxic  HENT:     Head: Normocephalic and atraumatic.  Eyes:     Extraocular Movements: Extraocular movements intact.     Conjunctiva/sclera: Conjunctivae normal.     Pupils: Pupils are equal,  round, and reactive to light.  Neck:     Musculoskeletal: Normal range of motion and neck supple.  Cardiovascular:     Rate and Rhythm: Normal rate and regular rhythm.     Pulses: Normal pulses.  Pulmonary:     Effort: Pulmonary effort is normal. No respiratory distress.     Breath sounds: Normal breath sounds. No wheezing.     Comments: Slight increased work of breathing, patient states this is baseline for her due to COPD.  SpO2stable on room air. Abdominal:     General: There is no distension.     Palpations: Abdomen is soft. There is no  mass.     Tenderness: There is no abdominal tenderness. There is no guarding or rebound.     Comments: No tenderness palpation the abdomen.  Soft without rigidity, guarding, distention.  Negative rebound.  No CVA tenderness.  Musculoskeletal: Normal range of motion.  Skin:    General: Skin is warm and dry.     Capillary Refill: Capillary refill takes less than 2 seconds.  Neurological:     Mental Status: She is alert and oriented to person, place, and time.      ED Treatments / Results  Labs (all labs ordered are listed, but only abnormal results are displayed) Labs Reviewed  COMPREHENSIVE METABOLIC PANEL - Abnormal; Notable for the following components:      Result Value   Potassium 3.3 (*)    CO2 21 (*)    Glucose, Bld 191 (*)    BUN 33 (*)    Creatinine, Ser 4.22 (*)    GFR calc non Af Amer 11 (*)    GFR calc Af Amer 13 (*)    All other components within normal limits  SARS CORONAVIRUS 2 (TAT 6-24 HRS)  CBC WITH DIFFERENTIAL/PLATELET  URINALYSIS, ROUTINE W REFLEX MICROSCOPIC    EKG None  Radiology No results found.  Procedures Procedures (including critical care time)  Medications Ordered in ED Medications  sodium chloride 0.9 % bolus 500 mL (has no administration in time range)    Followed by  0.9 %  sodium chloride infusion (has no administration in time range)     Initial Impression / Assessment and Plan / ED  Course  I have reviewed the triage vital signs and the nursing notes.  Pertinent labs & imaging results that were available during my care of the patient were reviewed by me and considered in my medical decision making (see chart for details).        Patient presenting for evaluation of abnormal lab.  Also reporting 1 month history of feeling "funny" with intermittent nausea and abdominal bloating.  Exam shows patient who appears nontoxic.  No abdominal tenderness or distention.  No CVA tenderness.  Labs show AKI with creatinine at 4.2, baseline is 1.  Otherwise labs are reassuring.  Will obtain urine to assess for infection as well as a post void bladder scan to assess for retention.  Patient will likely need to come into the hospital for further evaluation and management of her AKI. I do not believe she needs an emergent CT scan at this time.   Post void bladder scan shows 300 cc.  As such, Foley placed.  Likely urinary retention contributing to AKI.  Will call for admission.  Discussed with Dr. Doristine Bosworth from triad hospitalist service, patient be to admitted.  Final Clinical Impressions(s) / ED Diagnoses   Final diagnoses:  AKI (acute kidney injury) [facility])  Urinary retention    ED Discharge Orders    None       Franchot Heidelberg, PA-C 04/28/19 1754    Malvin Johns, MD 04/28/19 [phone]

## 2019-04-28 NOTE — H&P (Signed)
History and Physical    TERI KILMAN [id number] DOB: 08/31/1960 DOA: 04/28/2019  PCP: Alycia Rossetti, MD  Patient coming from: Home I have personally briefly reviewed patient's old medical records in [place]  Chief Complaint: Worsening kidney function.  HPI: Kelly Gibson is a 58 y.o. female with medical history significant of CVA with residual right-sided weakness, COPD, diabetes, hypertension, hyperlipidemia presents to emergency department due to worsening kidney function.  Patient reports that she was evaluated by her PCP last week and had regular lab work which showed worsening kidney function and she was advised to go to the emergency department for further evaluation and management.    Patient reports chronic nausea and vomiting and incomplete sensation of bladder emptying for 1 month.  She reports taking Advil PM and Tylenol PM almost every night for sleep.  She has diabetes for long time and right-sided residual weakness from a stroke since 1 year.  She denies use of over-the-counter Benadryl or any other antihistamine medicines.  Denies vomiting, abdominal pain, decreased appetite, constipation, chest pain, shortness of breath, palpitation, leg swelling, orthopnea, PND, headache or blurry vision.  She lives with her husband and denies smoking, alcohol, illicit drug use.  She uses walker for ambulation.  ED Course: Vitals stable.  BMP shows worsening of kidney function.  CBC: WNL.  Able to urinate 40 mL, bladder scan shows 302 ml-post void residual volume.  Patient appears comfortable.  Not in acute distress.    Review of Systems: As per HPI otherwise negative.    Past Medical History:  Diagnosis Date  . Bradycardia    SR/SB with up to 3.5 second pause (asymptomatic) on ~ 10/2017 event monitor; saw EP Dr. Rayann Heman: avoid AV nodal blocking agents  . COPD (chronic obstructive pulmonary disease) ([place])   . Depression   . Diabetes mellitus   . Hyperlipidemia    . Hypertension   . PONV (postoperative nausea and vomiting)   . Stroke [facility])     Past Surgical History:  Procedure Laterality Date  . CESAREAN SECTION     x 3  . ORIF WRIST FRACTURE Right 05/04/2018   Procedure: OPEN REDUCTION INTERNAL FIXATION (ORIF) RIGHT  WRIST FRACTURE;  Surgeon: Hiram Gash, MD;  Location: [facility];  Service: Orthopedics;  Laterality: Right;  . TUMOR REMOVAL     left shoulder     reports that she has quit smoking. She has a 20.00 pack-year smoking history. She has never used smokeless tobacco. She reports that she does not drink alcohol or use drugs.  Allergies  Allergen Reactions  . Niacin And Related Rash    Family History  Problem Relation Age of Onset  . Diabetes Mother   . Stroke Mother   . Heart disease Father   . Cancer Father   . Stroke Father     Prior to Admission medications   Medication Sig Start Date End Date Taking? Authorizing Provider  albuterol (VENTOLIN HFA) 108 (90 Base) MCG/ACT inhaler Inhale 2 puffs into the lungs every 6 (six) hours as needed for wheezing or shortness of breath. 01/09/19  Yes [place], Modena Nunnery, MD  amLODipine (NORVASC) 10 MG tablet Take 1 tablet (10 mg total) by mouth daily. Patient taking differently: Take 10 mg by mouth at bedtime.  01/09/19  Yes [place], Modena Nunnery, MD  aspirin EC 325 MG EC tablet Take 1 tablet (325 mg total) by mouth daily. 12/10/17  Yes Geradine Girt, DO  clotrimazole-betamethasone ([place])  cream Apply 1 application topically 2 (two) times daily. Patient taking differently: Apply 1 application topically 2 (two) times daily. BEHIND THE EARS 04/11/19  Yes [place], Modena Nunnery, MD  glyBURIDE (DIABETA) 5 MG tablet Take 1 tablet (5 mg total) by mouth 2 (two) times daily with a meal. 04/13/19  Yes Barrett, Modena Nunnery, MD  ibuprofen (ADVIL) 200 MG tablet Take 200-400 mg by mouth every 6 (six) hours as needed for headache or mild pain.   Yes [provider]  rosuvastatin (CRESTOR) 40 MG tablet TAKE  ONE TABLET BY MOUTH DAILY AT 6PM Patient taking differently: Take 40 mg by mouth at bedtime.  01/09/19  Yes [place], Modena Nunnery, MD  tiZANidine (ZANAFLEX) 4 MG tablet Take 1 tablet (4 mg total) by mouth every 6 (six) hours as needed for muscle spasms. 01/09/19  Yes [place], Modena Nunnery, MD  umeclidinium bromide (INCRUSE ELLIPTA) 62.5 MCG/INH AEPB Inhale 1 puff into the lungs daily. 01/09/19  Yes [place], Modena Nunnery, MD  Glucose Blood (BLOOD GLUCOSE TEST STRIPS) STRP Use as directed to monitor FSBS 2x daily. Dx: E11.9. 01/04/19   Alycia Rossetti, MD    Physical Exam: Vitals:   04/28/19 1555 04/28/19 1600 04/28/19 1745 04/28/19 1830  BP: (!) 158/94 (!) 154/82    Pulse: 69 86 85 79  Resp:      Temp:      TempSrc:      SpO2: 100% 100% 100% 100%    Constitutional: NAD, calm, comfortable Vitals:   04/28/19 1555 04/28/19 1600 04/28/19 1745 04/28/19 1830  BP: (!) 158/94 (!) 154/82    Pulse: 69 86 85 79  Resp:      Temp:      TempSrc:      SpO2: 100% 100% 100% 100%   Constitutional: Alert and oriented x3, not in acute distress, communicating well.   Eyes: PERRL, lids and conjunctivae normal ENMT: Mucous membranes are moist. Posterior pharynx clear of any exudate or lesions.Normal dentition.  Neck: normal, supple, no masses, no thyromegaly Respiratory: clear to auscultation bilaterally, no wheezing, no crackles. Normal respiratory effort. No accessory muscle use.  Cardiovascular: Regular rate and rhythm, no murmurs / rubs / gallops. No extremity edema. 2+ pedal pulses. No carotid bruits.  Abdomen: no tenderness, no masses palpated. No hepatosplenomegaly. Bowel sounds positive.  Musculoskeletal: no clubbing / cyanosis. No joint deformity upper and lower extremities. Good ROM, no contractures. Normal muscle tone.  Skin: no rashes, lesions, ulcers. No induration Neurologic: CN 2-12 grossly intact. Sensation intact, power 1 out of 5 in right upper and lower extremity and 5 out of 5 in left upper and  lower extremity.   Psychiatric: Normal judgment and insight. Alert and oriented x 3. Normal mood.    Labs on Admission: I have personally reviewed following labs and imaging studies  CBC: Recent Labs  Lab 04/28/19 1355  WBC 6.4  NEUTROABS 4.1  HGB 13.6  HCT 41.3  MCV 85.9  PLT [id number]   Basic Metabolic Panel: Recent Labs  Lab 04/27/19 0801 04/28/19 1355  NA 140 140  K 3.6 3.3*  CL 105 105  CO2 20 21*  GLUCOSE 107* 191*  BUN 38* 33*  CREATININE 4.81* 4.22*  CALCIUM 9.5 9.4   GFR: CrCl cannot be calculated (Unknown ideal weight.). Liver Function Tests: Recent Labs  Lab 04/28/19 1355  AST 17  ALT 11  ALKPHOS 103  BILITOT 0.4  PROT 7.7  ALBUMIN 4.0   No results for input(s): LIPASE,  AMYLASE in the last 168 hours. No results for input(s): AMMONIA in the last 168 hours. Coagulation Profile: No results for input(s): INR, PROTIME in the last 168 hours. Cardiac Enzymes: No results for input(s): CKTOTAL, CKMB, CKMBINDEX, TROPONINI in the last 168 hours. BNP (last 3 results) No results for input(s): PROBNP in the last 8760 hours. HbA1C: No results for input(s): HGBA1C in the last 72 hours. CBG: No results for input(s): GLUCAP in the last 168 hours. Lipid Profile: No results for input(s): CHOL, HDL, LDLCALC, TRIG, CHOLHDL, LDLDIRECT in the last 72 hours. Thyroid Function Tests: No results for input(s): TSH, T4TOTAL, FREET4, T3FREE, THYROIDAB in the last 72 hours. Anemia Panel: No results for input(s): VITAMINB12, FOLATE, FERRITIN, TIBC, IRON, RETICCTPCT in the last 72 hours. Urine analysis:    Component Value Date/Time   COLORURINE YELLOW 04/28/2019 1730   APPEARANCEUR HAZY (A) 04/28/2019 1730   LABSPEC 1.016 04/28/2019 1730   PHURINE 5.0 04/28/2019 1730   GLUCOSEU NEGATIVE 04/28/2019 1730   HGBUR NEGATIVE 04/28/2019 1730   BILIRUBINUR NEGATIVE 04/28/2019 1730   KETONESUR NEGATIVE 04/28/2019 1730   PROTEINUR 100 (A) 04/28/2019 1730   UROBILINOGEN 0.2  01/26/2007 1410   NITRITE NEGATIVE 04/28/2019 1730   LEUKOCYTESUR NEGATIVE 04/28/2019 1730    Radiological Exams on Admission: No results found.  Assessment/Plan Principal Problem:   AKI (acute kidney injury) ([place]) Active Problems:   Diabetes mellitus ([place])   Essential hypertension, benign   Obesity, unspecified   Hyperlipidemia   COPD (chronic obstructive pulmonary disease) ([facility])   Hemiparesis affecting right side as late effect of cerebrovascular accident (CVA) ([place])   Hypokalemia   Acute urinary retention    AKI: -Likely secondary to urinary retention due to neurogenic bladder 2/2 underlying chronic diabetes  versus chronic use of over-the-counter  NSAIDs. -We will admit patient for close monitoring. -Start on IV fluids.  Avoid nephrotoxic medication/antihistamine and anticholinergic medication.  UA is negative for infection.  Patient is afebrile, no leukocytosis.  Reports incomplete emptying of bladder. -Ordered renal ultrasound-to check for hydronephrosis. -Post void residual volume: More than 300 -Patient has Foley catheter for strict INO's and daily weight. -Monitor kidney function closely.  Urinary retention: -Bladder scan: Shows post void residual volume more than 300. -Risk factors: Aging, chronic diabetes mellitus and history of stroke with right-sided residual weakness -Patient has Foley catheter -We will monitor for now.  Might get benefit from urodynamic studies outpatient  Diabetes mellitus: Well controlled.  A1c 7.5%. -We will hold glyburide and start patient on sliding scale insulin. -Monitor blood glucose closely.  Hypertension: Stable -Continue amlodipine  Hyperlipidemia: Stable -Continue Crestor  CVA with right-sided residual weakness: -Continue aspirin and Crestor  COPD: Not in acute exacerbation -Patient maintaining oxygen saturation on room air. -Continue home inhalers.  Hypokalemia: Likely secondary to albuterol inhaler -Will monitor BMP.   DVT prophylaxis: TED/SCD, Lovenox  code Status: Full code  family Communication: Husband present at bedside.  Plan of care discussed with patient and her husband in length and they verbalized understanding and agreed with it. Disposition Plan: TBD Consults called: None Admission status: Inpatient  Mckinley Jewel MD [facility] Pager [phone]  If 7PM-7AM, please contact night-coverage [email or web address] Password [facility]  04/28/2019, 6:56 PM

## 2019-04-28 NOTE — ED Triage Notes (Signed)
Pt to ER sent by PCP for abnormal labs related to "kidney or liver function," family member cannot remember. Patient denies pain, but does report abdominal bloating. Also reports nausea at times.

## 2019-04-28 NOTE — Telephone Encounter (Signed)
Call placed to patient and patient made aware.  

## 2019-04-29 DIAGNOSIS — E785 Hyperlipidemia, unspecified: Secondary | ICD-10-CM

## 2019-04-29 DIAGNOSIS — I10 Essential (primary) hypertension: Secondary | ICD-10-CM

## 2019-04-29 DIAGNOSIS — J410 Simple chronic bronchitis: Secondary | ICD-10-CM

## 2019-04-29 DIAGNOSIS — I69351 Hemiplegia and hemiparesis following cerebral infarction affecting right dominant side: Secondary | ICD-10-CM

## 2019-04-29 DIAGNOSIS — E876 Hypokalemia: Secondary | ICD-10-CM

## 2019-04-29 DIAGNOSIS — R338 Other retention of urine: Secondary | ICD-10-CM

## 2019-04-29 LAB — CBC
HCT: 35 % — ABNORMAL LOW (ref 36.0–46.0)
Hemoglobin: 12.1 g/dL (ref 12.0–15.0)
MCH: 29.2 pg (ref 26.0–34.0)
MCHC: 34.6 g/dL (ref 30.0–36.0)
MCV: 84.3 fL (ref 80.0–100.0)
Platelets: 303 10*3/uL (ref 150–400)
RBC: 4.15 MIL/uL (ref 3.87–5.11)
RDW: 13.1 % (ref 11.5–15.5)
WBC: 7.7 10*3/uL (ref 4.0–10.5)
nRBC: 0 % (ref 0.0–0.2)

## 2019-04-29 LAB — BASIC METABOLIC PANEL
Anion gap: 8 (ref 5–15)
BUN: 28 mg/dL — ABNORMAL HIGH (ref 6–20)
CO2: 20 mmol/L — ABNORMAL LOW (ref 22–32)
Calcium: 8.5 mg/dL — ABNORMAL LOW (ref 8.9–10.3)
Chloride: 111 mmol/L (ref 98–111)
Creatinine, Ser: 3.43 mg/dL — ABNORMAL HIGH (ref 0.44–1.00)
GFR calc Af Amer: 16 mL/min — ABNORMAL LOW (ref >60)
GFR calc non Af Amer: 14 mL/min — ABNORMAL LOW (ref >60)
Glucose, Bld: 99 mg/dL (ref 70–99)
Potassium: 3 mmol/L — ABNORMAL LOW (ref 3.5–5.1)
Sodium: 139 mmol/L (ref 135–145)

## 2019-04-29 LAB — GLUCOSE, CAPILLARY
Glucose-Capillary: 84 mg/dL (ref 70–99)
Glucose-Capillary: 164 mg/dL — ABNORMAL HIGH (ref 70–99)
Glucose-Capillary: 121 mg/dL — ABNORMAL HIGH (ref 70–99)
Glucose-Capillary: 162 mg/dL — ABNORMAL HIGH (ref 70–99)

## 2019-04-29 LAB — SARS CORONAVIRUS 2 (TAT 6-24 HRS): SARS Coronavirus 2: NEGATIVE

## 2019-04-29 LAB — HIV ANTIBODY (ROUTINE TESTING W REFLEX): HIV Screen 4th Generation wRfx: NONREACTIVE

## 2019-04-29 MED ORDER — POTASSIUM CHLORIDE CRYS ER 20 MEQ PO TBCR
40.0000 meq | EXTENDED_RELEASE_TABLET | Freq: Once | ORAL | Status: AC
  Administered 2019-04-29: 09:00:00 40 meq via ORAL
  Filled 2019-04-29: qty 2

## 2019-04-29 MED ORDER — CHLORHEXIDINE GLUCONATE CLOTH 2 % EX PADS
6.0000 | MEDICATED_PAD | Freq: Every day | CUTANEOUS | Status: DC
  Administered 2019-04-29 – 2019-05-02 (×4): 6 via TOPICAL

## 2019-04-29 NOTE — Progress Notes (Signed)
PROGRESS NOTE  Kelly Gibson [id number] DOB: 03/16/1961 DOA: 04/28/2019 PCP: Kelly Rossetti, MD  HPI/Recap of past 24 hours: HPI from Dr Doristine Bosworth Kelly Gibson is a 58 y.o. female with medical history significant of CVA with residual right-sided weakness, COPD, diabetes, hypertension, hyperlipidemia presents to ED due to worsening kidney function.  Patient reports that she was evaluated by her PCP last week and had regular lab work which showed worsening kidney function and she was advised to go to the ED. Patient reports chronic nausea and vomiting and incomplete sensation of bladder emptying for 1 month.  She reports taking 3 tablets of Advil PM every night for years as sleep aid. Denies any other complaints. In the ED, Cr elevated from baseline, bladder scan shows 302 ml-post void residual volume.  Admitted for further management.    Today, patient denies any new complaints, denies any chest pain, shortness of breath, still reports some nausea but denies any current vomiting, denies dysuria, fever/chills, abdominal pain.   Assessment/Plan: Principal Problem:   AKI (acute kidney injury) ([place]) Active Problems:   Diabetes mellitus ([place])   Essential hypertension, benign   Obesity, unspecified   Hyperlipidemia   COPD (chronic obstructive pulmonary disease) ([facility])   Hemiparesis affecting right side as late effect of cerebrovascular accident (CVA) ([place])   Hypokalemia   Acute urinary retention  AKI Likely ATN 2/2 chronic daily NSAID use Vs ?  Urinary retention/neurogenic bladder UA negative for infection Renal ultrasound showed normal left kidney and the right kidney had suboptimal imaging window due to patient's position.  Limited evaluation of the bladder due to decompression Continue IV fluids Continue Foley, strict I's and O's Daily BMP  Urinary retention Continue Foley catheter, plan to start voiding trial May benefit from urodynamic studies as an  outpatient/urology consult  Hypokalemia Replace PRN  Diabetes mellitus type 2 Last A1c 7.5 on 04/11/2019 SSI, hypoglycemic protocol, Accu-Cheks Hold home glyburide  Hypertension/hyperlipidemia Stable Continue amlodipine, Crestor  CVA with residual right-sided weakness Stable Continue aspirin, Crestor  COPD Stable Continue home inhalers           Malnutrition Type:      Malnutrition Characteristics:      Nutrition Interventions:       Estimated body mass index is 30.33 kg/m as calculated from the following:   Height as of 05/04/18: 5\' 3"  (1.6 m).   Weight as of 04/11/19: 77.7 kg.     Code Status: Full  Family Communication: None at bedside  Disposition Plan: Likely home   Consultants:  None  Procedures:  None  Antimicrobials:  None  DVT prophylaxis: Lovenox   Objective: Vitals:   04/29/19 0903 04/29/19 0909 04/29/19 1100 04/29/19 1142  BP: (!) 144/84 (!) 144/84 138/80 128/69  Pulse: 91  88 (!) 53  Resp: 15  16 16   Temp: 98.2 F (36.8 C)  98.4 F (36.9 C) 98.6 F (37 C)  TempSrc: Oral  Oral Oral  SpO2: 94%  97% 100%    Intake/Output Summary (Last 24 hours) at 04/29/2019 1154 Last data filed at 04/29/2019 1100 Gross per 24 hour  Intake 1928.28 ml  Output 1700 ml  Net 228.28 ml   There were no vitals filed for this visit.  Exam:  General: NAD   Cardiovascular: S1, S2 present  Respiratory: CTAB  Abdomen: Soft, nontender, nondistended, bowel sounds present  Musculoskeletal: No bilateral pedal edema noted  Skin: Normal  Psychiatry: Normal mood    Data Reviewed: CBC:  Recent Labs  Lab 04/28/19 1355 04/29/19 0530  WBC 6.4 7.7  NEUTROABS 4.1  --   HGB 13.6 12.1  HCT 41.3 35.0*  MCV 85.9 84.3  PLT 352 [id number]   Basic Metabolic Panel: Recent Labs  Lab 04/27/19 0801 04/28/19 1355 04/29/19 0530  NA 140 140 139  K 3.6 3.3* 3.0*  CL 105 105 111  CO2 20 21* 20*  GLUCOSE 107* 191* 99  BUN 38* 33* 28*   CREATININE 4.81* 4.22* 3.43*  CALCIUM 9.5 9.4 8.5*   GFR: CrCl cannot be calculated (Unknown ideal weight.). Liver Function Tests: Recent Labs  Lab 04/28/19 1355  AST 17  ALT 11  ALKPHOS 103  BILITOT 0.4  PROT 7.7  ALBUMIN 4.0   No results for input(s): LIPASE, AMYLASE in the last 168 hours. No results for input(s): AMMONIA in the last 168 hours. Coagulation Profile: No results for input(s): INR, PROTIME in the last 168 hours. Cardiac Enzymes: No results for input(s): CKTOTAL, CKMB, CKMBINDEX, TROPONINI in the last 168 hours. BNP (last 3 results) No results for input(s): PROBNP in the last 8760 hours. HbA1C: No results for input(s): HGBA1C in the last 72 hours. CBG: Recent Labs  Lab 04/28/19 2222 04/29/19 0646  GLUCAP 149* 84   Lipid Profile: No results for input(s): CHOL, HDL, LDLCALC, TRIG, CHOLHDL, LDLDIRECT in the last 72 hours. Thyroid Function Tests: No results for input(s): TSH, T4TOTAL, FREET4, T3FREE, THYROIDAB in the last 72 hours. Anemia Panel: No results for input(s): VITAMINB12, FOLATE, FERRITIN, TIBC, IRON, RETICCTPCT in the last 72 hours. Urine analysis:    Component Value Date/Time   COLORURINE YELLOW 04/28/2019 1730   APPEARANCEUR HAZY (A) 04/28/2019 1730   LABSPEC 1.016 04/28/2019 1730   PHURINE 5.0 04/28/2019 1730   GLUCOSEU NEGATIVE 04/28/2019 1730   HGBUR NEGATIVE 04/28/2019 1730   BILIRUBINUR NEGATIVE 04/28/2019 1730   KETONESUR NEGATIVE 04/28/2019 1730   PROTEINUR 100 (A) 04/28/2019 1730   UROBILINOGEN 0.2 01/26/2007 1410   NITRITE NEGATIVE 04/28/2019 1730   LEUKOCYTESUR NEGATIVE 04/28/2019 1730   Sepsis Labs: @LABRCNTIP (procalcitonin:4,lacticidven:4)  ) Recent Results (from the past 240 hour(s))  SARS CORONAVIRUS 2 (TAT 6-24 HRS) Nasopharyngeal Nasopharyngeal Swab     Status: None   Collection Time: 04/28/19  3:13 PM   Specimen: Nasopharyngeal Swab  Result Value Ref Range Status   SARS Coronavirus 2 NEGATIVE NEGATIVE Final     Comment: (NOTE) SARS-CoV-2 target nucleic acids are NOT DETECTED. The SARS-CoV-2 RNA is generally detectable in upper and lower respiratory specimens during the acute phase of infection. Negative results do not preclude SARS-CoV-2 infection, do not rule out co-infections with other pathogens, and should not be used as the sole basis for treatment or other patient management decisions. Negative results must be combined with clinical observations, patient history, and epidemiological information. The expected result is Negative. Fact Sheet for Patients: SugarRoll.be Fact Sheet for Healthcare Providers: https://www.woods-mathews.com/ This test is not yet approved or cleared by the [place] FDA and  has been authorized for detection and/or diagnosis of SARS-CoV-2 by FDA under an Emergency Use Authorization (EUA). This EUA will remain  in effect (meaning this test can be used) for the duration of the COVID-19 declaration under Section 56 4(b)(1) of the Act, 21 U.S.C. section 360bbb-3(b)(1), unless the authorization is terminated or revoked sooner. Performed at [facility], [place] [address], [place], [place] [address]       Studies: US Renal  Result Date: 04/28/2019 CLINICAL DATA:  Increased  BUN and creatinine, AK I EXAM: RENAL / URINARY TRACT ULTRASOUND COMPLETE COMPARISON:  None. FINDINGS: Right Kidney: Suboptimal imaging window. Patient paralysis limiting ability to reposition patient. Renal measurements: 7.0 x 4.1 x 4.3 cm = volume: 64.8 mL . Echogenicity within normal limits. No mass or hydronephrosis seen within the visualized portions of the right kidney. Left Kidney: Renal measurements: 10 x 5.3 x 5.0 = volume: 137.1 mL. Echogenicity within normal limits. No mass or hydronephrosis visualized. Bladder: Largely decompressed and poorly evaluated by ultrasound. IMPRESSION: Suboptimal imaging window of the right kidney due to difficulty  with patient positioning. No gross right or left renal abnormality is seen. No hydronephrosis or visible urolithiasis. Limited evaluation of bladder due to decompression. Electronically Signed   By: Lovena Le M.D.   On: 04/28/2019 19:59    Scheduled Meds: . amLODipine  10 mg Oral Daily  . aspirin EC  81 mg Oral Daily  . Chlorhexidine Gluconate Cloth  6 each Topical Daily  . enoxaparin (LOVENOX) injection  30 mg Subcutaneous Q24H  . insulin aspart  0-15 Units Subcutaneous TID WC  . insulin aspart  0-5 Units Subcutaneous QHS  . rosuvastatin  40 mg Oral QHS  . umeclidinium bromide  1 puff Inhalation Daily    Continuous Infusions: . sodium chloride    . sodium chloride 100 mL/hr (04/29/19 0249)     LOS: 1 day     Alma Friendly, MD [facility]  If 7PM-7AM, please contact night-coverage [email or web address] 04/29/2019, 11:54 AM

## 2019-04-29 NOTE — Progress Notes (Signed)
Patient arrived to [facility] from [facility].

## 2019-04-30 LAB — BASIC METABOLIC PANEL
Anion gap: 12 (ref 5–15)
BUN: 23 mg/dL — ABNORMAL HIGH (ref 6–20)
CO2: 19 mmol/L — ABNORMAL LOW (ref 22–32)
Calcium: 8.3 mg/dL — ABNORMAL LOW (ref 8.9–10.3)
Chloride: 111 mmol/L (ref 98–111)
Creatinine, Ser: 2.65 mg/dL — ABNORMAL HIGH (ref 0.44–1.00)
GFR calc Af Amer: 22 mL/min — ABNORMAL LOW (ref >60)
GFR calc non Af Amer: 19 mL/min — ABNORMAL LOW (ref >60)
Glucose, Bld: 64 mg/dL — ABNORMAL LOW (ref 70–99)
Potassium: 2.8 mmol/L — ABNORMAL LOW (ref 3.5–5.1)
Sodium: 142 mmol/L (ref 135–145)

## 2019-04-30 LAB — GLUCOSE, CAPILLARY
Glucose-Capillary: 72 mg/dL (ref 70–99)
Glucose-Capillary: 122 mg/dL — ABNORMAL HIGH (ref 70–99)
Glucose-Capillary: 69 mg/dL — ABNORMAL LOW (ref 70–99)
Glucose-Capillary: 180 mg/dL — ABNORMAL HIGH (ref 70–99)

## 2019-04-30 MED ORDER — POTASSIUM CHLORIDE CRYS ER 20 MEQ PO TBCR
40.0000 meq | EXTENDED_RELEASE_TABLET | Freq: Two times a day (BID) | ORAL | Status: AC
  Administered 2019-04-30 (×2): 40 meq via ORAL
  Filled 2019-04-30 (×2): qty 2

## 2019-04-30 MED ORDER — PNEUMOCOCCAL VAC POLYVALENT 25 MCG/0.5ML IJ INJ: 0.5000 mL | INJECTION | INTRAMUSCULAR | Status: DC

## 2019-04-30 NOTE — Plan of Care (Signed)
  Problem: Education: Goal: Knowledge of General Education information will improve Description: Including pain rating scale, medication(s)/side effects and non-pharmacologic comfort measures Outcome: Progressing   Problem: Health Behavior/Discharge Planning: Goal: Ability to manage health-related needs will improve Outcome: Progressing   Problem: Clinical Measurements: Goal: Ability to maintain clinical measurements within normal limits will improve Outcome: Progressing   Problem: Nutrition: Goal: Adequate nutrition will be maintained Outcome: Progressing   Problem: Elimination: Goal: Will not experience complications related to bowel motility Outcome: Progressing   Problem: Safety: Goal: Ability to remain free from injury will improve Outcome: Progressing   Problem: Skin Integrity: Goal: Risk for impaired skin integrity will decrease Outcome: Progressing

## 2019-04-30 NOTE — Progress Notes (Signed)
PROGRESS NOTE  Kelly Gibson [id number] DOB: 1960/08/05 DOA: 04/28/2019 PCP: Alycia Rossetti, MD  HPI/Recap of past 24 hours: HPI from Dr Doristine Bosworth [name] is a 58 y.o. female with medical history significant of CVA with residual right-sided weakness, COPD, diabetes, hypertension, hyperlipidemia presents to ED due to worsening kidney function.  Patient reports that she was evaluated by her PCP last week and had regular lab work which showed worsening kidney function and she was advised to go to the ED. Patient reports chronic nausea and vomiting and incomplete sensation of bladder emptying for 1 month.  She reports taking 3 tablets of Advil PM every night for years as sleep aid. Denies any other complaints. In the ED, Cr elevated from baseline, bladder scan shows 302 ml-post void residual volume.  Admitted for further management.    Today, patient denies any new complaints.  Assessment/Plan: Principal Problem:   AKI (acute kidney injury) ([place]) Active Problems:   Diabetes mellitus ([place])   Essential hypertension, benign   Obesity, unspecified   Hyperlipidemia   COPD (chronic obstructive pulmonary disease) (H[facility])   Hemiparesis affecting right side as late effect of cerebrovascular accident (CVA) ([place])   Hypokalemia   Acute urinary retention  AKI Likely ATN 2/2 chronic daily NSAID use Vs ?  Urinary retention/neurogenic bladder UA negative for infection Renal ultrasound showed normal left kidney and the right kidney had suboptimal imaging window due to patient's position.  Limited evaluation of the bladder due to decompression Continue IV fluids Continue Foley, strict I's and O's Daily BMP  Urinary retention Continue Foley catheter, plan to start voiding trial May benefit from urodynamic studies as an outpatient/urology consult  Hypokalemia Replace PRN  Diabetes mellitus type 2 Last A1c 7.5 on 04/11/2019 SSI, hypoglycemic protocol, Accu-Cheks Hold home  glyburide  Hypertension/hyperlipidemia Stable Continue amlodipine, Crestor  CVA with residual right-sided weakness Stable Continue aspirin, Crestor  COPD Stable Continue home inhalers           Malnutrition Type:      Malnutrition Characteristics:      Nutrition Interventions:       Estimated body mass index is 30.11 kg/m as calculated from the following:   Height as of this encounter: 5\' 3"  (1.6 m).   Weight as of this encounter: 77.1 kg.     Code Status: Full  Family Communication: None at bedside  Disposition Plan: Likely home   Consultants:  None  Procedures:  None  Antimicrobials:  None  DVT prophylaxis: Lovenox   Objective: Vitals:   04/30/19 0624 04/30/19 0812 04/30/19 1018 04/30/19 1037  BP:  128/80 128/80 128/80  Pulse:  81  81  Resp:  15  15  Temp:  97.7 F (36.5 C)  97.7 F (36.5 C)  TempSrc:  Oral  Oral  SpO2:  (!) 87%    Weight: 77.1 kg     Height:    5\' 3"  (1.6 m)    Intake/Output Summary (Last 24 hours) at 04/30/2019 1418 Last data filed at 04/30/2019 1045 Gross per 24 hour  Intake 2426.46 ml  Output 2100 ml  Net 326.46 ml   Filed Weights   04/30/19 0624  Weight: 77.1 kg    Exam:  General: NAD   Cardiovascular: S1, S2 present  Respiratory: CTAB  Abdomen: Soft, nontender, nondistended, bowel sounds present  Musculoskeletal: No bilateral pedal edema noted  Skin: Normal  Psychiatry: Normal mood    Data Reviewed: CBC: Recent Labs  Lab 04/28/19  1355 04/29/19 0530  WBC 6.4 7.7  NEUTROABS 4.1  --   HGB 13.6 12.1  HCT 41.3 35.0*  MCV 85.9 84.3  PLT 352 [id number]   Basic Metabolic Panel: Recent Labs  Lab 04/27/19 0801 04/28/19 1355 04/29/19 0530 04/30/19 0537  NA 140 140 139 142  K 3.6 3.3* 3.0* 2.8*  CL 105 105 111 111  CO2 20 21* 20* 19*  GLUCOSE 107* 191* 99 64*  BUN 38* 33* 28* 23*  CREATININE 4.81* 4.22* 3.43* 2.65*  CALCIUM 9.5 9.4 8.5* 8.3*   GFR: Estimated Creatinine  Clearance: 23.3 mL/min (A) (by C-G formula based on SCr of 2.65 mg/dL (H)). Liver Function Tests: Recent Labs  Lab 04/28/19 1355  AST 17  ALT 11  ALKPHOS 103  BILITOT 0.4  PROT 7.7  ALBUMIN 4.0   No results for input(s): LIPASE, AMYLASE in the last 168 hours. No results for input(s): AMMONIA in the last 168 hours. Coagulation Profile: No results for input(s): INR, PROTIME in the last 168 hours. Cardiac Enzymes: No results for input(s): CKTOTAL, CKMB, CKMBINDEX, TROPONINI in the last 168 hours. BNP (last 3 results) No results for input(s): PROBNP in the last 8760 hours. HbA1C: No results for input(s): HGBA1C in the last 72 hours. CBG: Recent Labs  Lab 04/29/19 1140 04/29/19 1712 04/29/19 2108 04/30/19 0625 04/30/19 1154  GLUCAP 164* 121* 162* 72 122*   Lipid Profile: No results for input(s): CHOL, HDL, LDLCALC, TRIG, CHOLHDL, LDLDIRECT in the last 72 hours. Thyroid Function Tests: No results for input(s): TSH, T4TOTAL, FREET4, T3FREE, THYROIDAB in the last 72 hours. Anemia Panel: No results for input(s): VITAMINB12, FOLATE, FERRITIN, TIBC, IRON, RETICCTPCT in the last 72 hours. Urine analysis:    Component Value Date/Time   COLORURINE YELLOW 04/28/2019 1730   APPEARANCEUR HAZY (A) 04/28/2019 1730   LABSPEC 1.016 04/28/2019 1730   PHURINE 5.0 04/28/2019 1730   GLUCOSEU NEGATIVE 04/28/2019 1730   HGBUR NEGATIVE 04/28/2019 1730   BILIRUBINUR NEGATIVE 04/28/2019 1730   KETONESUR NEGATIVE 04/28/2019 1730   PROTEINUR 100 (A) 04/28/2019 1730   UROBILINOGEN 0.2 01/26/2007 1410   NITRITE NEGATIVE 04/28/2019 1730   LEUKOCYTESUR NEGATIVE 04/28/2019 1730   Sepsis Labs: @LABRCNTIP (procalcitonin:4,lacticidven:4)  ) Recent Results (from the past 240 hour(s))  SARS CORONAVIRUS 2 (TAT 6-24 HRS) Nasopharyngeal Nasopharyngeal Swab     Status: None   Collection Time: 04/28/19  3:13 PM   Specimen: Nasopharyngeal Swab  Result Value Ref Range Status   SARS Coronavirus 2  NEGATIVE NEGATIVE Final    Comment: (NOTE) SARS-CoV-2 target nucleic acids are NOT DETECTED. The SARS-CoV-2 RNA is generally detectable in upper and lower respiratory specimens during the acute phase of infection. Negative results do not preclude SARS-CoV-2 infection, do not rule out co-infections with other pathogens, and should not be used as the sole basis for treatment or other patient management decisions. Negative results must be combined with clinical observations, patient history, and epidemiological information. The expected result is Negative. Fact Sheet for Patients: SugarRoll.be Fact Sheet for Healthcare Providers: https://www.woods-mathews.com/ This test is not yet approved or cleared by the [place] FDA and  has been authorized for detection and/or diagnosis of SARS-CoV-2 by FDA under an Emergency Use Authorization (EUA). This EUA will remain  in effect (meaning this test can be used) for the duration of the COVID-19 declaration under Section 56 4(b)(1) of the Act, 21 U.S.C. section 360bbb-3(b)(1), unless the authorization is terminated or revoked sooner. Performed at [facility], [address] [place], [place] [address]       Studies: No results found.  Scheduled Meds: . amLODipine  10 mg Oral Daily  . aspirin EC  81 mg Oral Daily  . Chlorhexidine Gluconate Cloth  6 each Topical Daily  . enoxaparin (LOVENOX) injection  30 mg Subcutaneous Q24H  . insulin aspart  0-15 Units Subcutaneous TID WC  . insulin aspart  0-5 Units Subcutaneous QHS  . [START ON 05/01/2019] pneumococcal 23 valent vaccine  0.5 mL Intramuscular Tomorrow-1000  . potassium chloride  40 mEq Oral BID  . rosuvastatin  40 mg Oral QHS  . umeclidinium bromide  1 puff Inhalation Daily    Continuous Infusions: . sodium chloride 75 mL/hr at 04/30/19 1016     LOS: 2 days     Alma Friendly, MD [facility]  If 7PM-7AM, please  contact night-coverage [email or web address] 04/30/2019, 2:18 PM

## 2019-05-01 ENCOUNTER — Inpatient Hospital Stay (HOSPITAL_COMMUNITY): Payer: BC Managed Care – PPO

## 2019-05-01 LAB — GLUCOSE, CAPILLARY
Glucose-Capillary: 80 mg/dL (ref 70–99)
Glucose-Capillary: 112 mg/dL — ABNORMAL HIGH (ref 70–99)
Glucose-Capillary: 134 mg/dL — ABNORMAL HIGH (ref 70–99)
Glucose-Capillary: 164 mg/dL — ABNORMAL HIGH (ref 70–99)

## 2019-05-01 LAB — BASIC METABOLIC PANEL
Anion gap: 9 (ref 5–15)
BUN: 20 mg/dL (ref 6–20)
CO2: 18 mmol/L — ABNORMAL LOW (ref 22–32)
Calcium: 8.7 mg/dL — ABNORMAL LOW (ref 8.9–10.3)
Chloride: 114 mmol/L — ABNORMAL HIGH (ref 98–111)
Creatinine, Ser: 2.23 mg/dL — ABNORMAL HIGH (ref 0.44–1.00)
GFR calc Af Amer: 28 mL/min — ABNORMAL LOW (ref >60)
GFR calc non Af Amer: 24 mL/min — ABNORMAL LOW (ref >60)
Glucose, Bld: 69 mg/dL — ABNORMAL LOW (ref 70–99)
Potassium: 3.5 mmol/L (ref 3.5–5.1)
Sodium: 141 mmol/L (ref 135–145)

## 2019-05-01 MED ORDER — IPRATROPIUM-ALBUTEROL 0.5-2.5 (3) MG/3ML IN SOLN
3.0000 mL | Freq: Four times a day (QID) | RESPIRATORY_TRACT | Status: DC
  Administered 2019-05-01: 3 mL via RESPIRATORY_TRACT
  Filled 2019-05-01: qty 3

## 2019-05-01 MED ORDER — INSULIN ASPART 100 UNIT/ML ~~LOC~~ SOLN
0.0000 [IU] | Freq: Three times a day (TID) | SUBCUTANEOUS | Status: DC
  Administered 2019-05-01: 17:00:00 1 [IU] via SUBCUTANEOUS

## 2019-05-01 MED ORDER — SODIUM BICARBONATE 650 MG PO TABS
650.0000 mg | ORAL_TABLET | Freq: Three times a day (TID) | ORAL | Status: DC
  Administered 2019-05-01 – 2019-05-02 (×3): 650 mg via ORAL
  Filled 2019-05-01 (×3): qty 1

## 2019-05-01 NOTE — Progress Notes (Signed)
PROGRESS NOTE  Kelly Gibson [id number] DOB: 1961/04/03 DOA: 04/28/2019 PCP: Alycia Rossetti, MD  HPI/Recap of past 24 hours: HPI from Dr Kelly Gibson is a 58 y.o. female with medical history significant of CVA with residual right-sided weakness, COPD, diabetes, hypertension, hyperlipidemia presents to ED due to worsening kidney function.  Patient reports that she was evaluated by her PCP last week and had regular lab work which showed worsening kidney function and she was advised to go to the ED. Patient reports chronic nausea and vomiting and incomplete sensation of bladder emptying for 1 month.  She reports taking 3 tablets of Advil PM every night for years as sleep aid. Denies any other complaints. In the ED, Cr elevated from baseline, bladder scan shows 302 ml-post void residual volume.  Admitted for further management.    Earlier this morning, patient denied any new complaints.  During an attempt to ambulate to the bedside commode, patient significantly short of breath with some wheezing noted, sats maintained 100% on room air.  Patient denies any chest pain, abdominal pain, fever/chills, nausea/vomiting.  Assessment/Plan: Principal Problem:   AKI (acute kidney injury) ([place]) Active Problems:   Diabetes mellitus ([place])   Essential hypertension, benign   Obesity, unspecified   Hyperlipidemia   COPD (chronic obstructive pulmonary disease) (H[facility])   Hemiparesis affecting right side as late effect of cerebrovascular accident (CVA) ([place])   Hypokalemia   Acute urinary retention  AKI Likely ATN 2/2 chronic daily NSAID use Vs ?  Urinary retention/neurogenic bladder UA negative for infection Renal ultrasound showed normal left kidney and the right kidney had suboptimal imaging window due to patient's position.  Limited evaluation of the bladder due to decompression DC IV fluids Discontinue Foley, strict I's and O's Daily BMP  Urinary retention Discontinue  Foley catheter, and start voiding trial May benefit from urodynamic studies as an outpatient/urology consult  Hypokalemia Replace PRN  Diabetes mellitus type 2 Last A1c 7.5 on 04/11/2019 SSI, hypoglycemic protocol, Accu-Cheks Hold home glyburide  Hypertension/hyperlipidemia Stable Continue amlodipine, Crestor  CVA with residual right-sided weakness Stable Continue aspirin, Crestor PT/OT  COPD Noted SOB Chest x-ray pending Start duo nebs scheduled, continue home inhalers       Malnutrition Type:      Malnutrition Characteristics:      Nutrition Interventions:       Estimated body mass index is 30.11 kg/m as calculated from the following:   Height as of this encounter: 5\' 3"  (1.6 m).   Weight as of this encounter: 77.1 kg.     Code Status: Full  Family Communication: None at bedside  Disposition Plan: Likely home   Consultants:  None  Procedures:  None  Antimicrobials:  None  DVT prophylaxis: Lovenox   Objective: Vitals:   05/01/19 0336 05/01/19 0725 05/01/19 0825 05/01/19 1434  BP: 128/75  (!) 147/84 (!) 150/78  Pulse: 75 72 89 89  Resp:  18 15 16   Temp: 98 F (36.7 C)  98.1 F (36.7 C) 98.3 F (36.8 C)  TempSrc: Oral  Oral Oral  SpO2: 97% 98% 100% 100%  Weight:      Height:        Intake/Output Summary (Last 24 hours) at 05/01/2019 1639 Last data filed at 05/01/2019 1525 Gross per 24 hour  Intake 600 ml  Output 2050 ml  Net -1450 ml   Filed Weights   04/30/19 0624  Weight: 77.1 kg    Exam:  General: NAD  Cardiovascular: S1, S2 present  Respiratory: CTAB  Abdomen: Soft, nontender, nondistended, bowel sounds present  Musculoskeletal: No bilateral pedal edema noted  Skin: Normal  Psychiatry: Normal mood   Data Reviewed: CBC: Recent Labs  Lab 04/28/19 1355 04/29/19 0530  WBC 6.4 7.7  NEUTROABS 4.1  --   HGB 13.6 12.1  HCT 41.3 35.0*  MCV 85.9 84.3  PLT 352 [id number]   Basic Metabolic Panel: Recent  Labs  Lab 04/27/19 0801 04/28/19 1355 04/29/19 0530 04/30/19 0537 05/01/19 0359  NA 140 140 139 142 141  K 3.6 3.3* 3.0* 2.8* 3.5  CL 105 105 111 111 114*  CO2 20 21* 20* 19* 18*  GLUCOSE 107* 191* 99 64* 69*  BUN 38* 33* 28* 23* 20  CREATININE 4.81* 4.22* 3.43* 2.65* 2.23*  CALCIUM 9.5 9.4 8.5* 8.3* 8.7*   GFR: Estimated Creatinine Clearance: 27.7 mL/min (A) (by C-G formula based on SCr of 2.23 mg/dL (H)). Liver Function Tests: Recent Labs  Lab 04/28/19 1355  AST 17  ALT 11  ALKPHOS 103  BILITOT 0.4  PROT 7.7  ALBUMIN 4.0   No results for input(s): LIPASE, AMYLASE in the last 168 hours. No results for input(s): AMMONIA in the last 168 hours. Coagulation Profile: No results for input(s): INR, PROTIME in the last 168 hours. Cardiac Enzymes: No results for input(s): CKTOTAL, CKMB, CKMBINDEX, TROPONINI in the last 168 hours. BNP (last 3 results) No results for input(s): PROBNP in the last 8760 hours. HbA1C: No results for input(s): HGBA1C in the last 72 hours. CBG: Recent Labs  Lab 04/30/19 1718 04/30/19 2111 05/01/19 0644 05/01/19 1302 05/01/19 1636  GLUCAP 69* 180* 80 112* 134*   Lipid Profile: No results for input(s): CHOL, HDL, LDLCALC, TRIG, CHOLHDL, LDLDIRECT in the last 72 hours. Thyroid Function Tests: No results for input(s): TSH, T4TOTAL, FREET4, T3FREE, THYROIDAB in the last 72 hours. Anemia Panel: No results for input(s): VITAMINB12, FOLATE, FERRITIN, TIBC, IRON, RETICCTPCT in the last 72 hours. Urine analysis:    Component Value Date/Time   COLORURINE YELLOW 04/28/2019 1730   APPEARANCEUR HAZY (A) 04/28/2019 1730   LABSPEC 1.016 04/28/2019 1730   PHURINE 5.0 04/28/2019 1730   GLUCOSEU NEGATIVE 04/28/2019 1730   HGBUR NEGATIVE 04/28/2019 1730   BILIRUBINUR NEGATIVE 04/28/2019 1730   KETONESUR NEGATIVE 04/28/2019 1730   PROTEINUR 100 (A) 04/28/2019 1730   UROBILINOGEN 0.2 01/26/2007 1410   NITRITE NEGATIVE 04/28/2019 1730   LEUKOCYTESUR  NEGATIVE 04/28/2019 1730   Sepsis Labs: @LABRCNTIP (procalcitonin:4,lacticidven:4)  ) Recent Results (from the past 240 hour(s))  SARS CORONAVIRUS 2 (TAT 6-24 HRS) Nasopharyngeal Nasopharyngeal Swab     Status: None   Collection Time: 04/28/19  3:13 PM   Specimen: Nasopharyngeal Swab  Result Value Ref Range Status   SARS Coronavirus 2 NEGATIVE NEGATIVE Final    Comment: (NOTE) SARS-CoV-2 target nucleic acids are NOT DETECTED. The SARS-CoV-2 RNA is generally detectable in upper and lower respiratory specimens during the acute phase of infection. Negative results do not preclude SARS-CoV-2 infection, do not rule out co-infections with other pathogens, and should not be used as the sole basis for treatment or other patient management decisions. Negative results must be combined with clinical observations, patient history, and epidemiological information. The expected result is Negative. Fact Sheet for Patients: SugarRoll.be Fact Sheet for Healthcare Providers: https://www.woods-mathews.com/ This test is not yet approved or cleared by the [place] FDA and  has been authorized for detection and/or diagnosis of SARS-CoV-2 by FDA under an Emergency  Use Authorization (EUA). This EUA will remain  in effect (meaning this test can be used) for the duration of the COVID-19 declaration under Section 56 4(b)(1) of the Act, 21 U.S.C. section 360bbb-3(b)(1), unless the authorization is terminated or revoked sooner. Performed at [facility], [place] [address], [place], [place] [address]       Studies: No results found.  Scheduled Meds: . amLODipine  10 mg Oral Daily  . aspirin EC  81 mg Oral Daily  . Chlorhexidine Gluconate Cloth  6 each Topical Daily  . enoxaparin (LOVENOX) injection  30 mg Subcutaneous Q24H  . insulin aspart  0-5 Units Subcutaneous QHS  . insulin aspart  0-9 Units Subcutaneous TID WC  . ipratropium-albuterol  3 mL  Nebulization Q6H  . pneumococcal 23 valent vaccine  0.5 mL Intramuscular Tomorrow-1000  . rosuvastatin  40 mg Oral QHS  . sodium bicarbonate  650 mg Oral TID  . umeclidinium bromide  1 puff Inhalation Daily    Continuous Infusions:    LOS: 3 days     Alma Friendly, MD [facility]  If 7PM-7AM, please contact night-coverage [email or web address] 05/01/2019, 4:39 PM

## 2019-05-01 NOTE — Progress Notes (Signed)
Inpatient Diabetes Program Recommendations  AACE/ADA: New Consensus Statement on Inpatient Glycemic Control (2015)  Target Ranges:  Prepandial:   less than 140 mg/dL      Peak postprandial:   less than 180 mg/dL (1-2 hours)      Critically ill patients:  140 - 180 mg/dL   Lab Results  Component Value Date   GLUCAP 80 05/01/2019   HGBA1C 7.5 (H) 04/11/2019    Review of Glycemic Control Results for [name], [name] (MRN [id number]) as of 05/01/2019 11:25  Ref. Range 04/30/2019 17:18 04/30/2019 21:11 05/01/2019 06:44  Glucose-Capillary Latest Ref Range: 70 - 99 mg/dL 69 (L) 180 (H) 80   Diabetes history: Type 2 DM Outpatient Diabetes medications: Glyburide 5 mg BID Current orders for Inpatient glycemic control: Novolog 0-15 units TID, Novolog 0-5 units QHS  Inpatient Diabetes Program Recommendations:    Noted patient experiencing mild hypoglycemia. Consider decreasing correction to Novolog 0-9 units TID.   Thanks, Bronson Curb, MSN, RNC-OB Diabetes Coordinator [phone] (8a-5p)

## 2019-05-02 ENCOUNTER — Ambulatory Visit: Payer: BC Managed Care – PPO | Admitting: Family Medicine

## 2019-05-02 LAB — BASIC METABOLIC PANEL
Anion gap: 13 (ref 5–15)
BUN: 18 mg/dL (ref 6–20)
CO2: 21 mmol/L — ABNORMAL LOW (ref 22–32)
Calcium: 9.4 mg/dL (ref 8.9–10.3)
Chloride: 107 mmol/L (ref 98–111)
Creatinine, Ser: 1.86 mg/dL — ABNORMAL HIGH (ref 0.44–1.00)
GFR calc Af Amer: 34 mL/min — ABNORMAL LOW (ref >60)
GFR calc non Af Amer: 30 mL/min — ABNORMAL LOW (ref >60)
Glucose, Bld: 88 mg/dL (ref 70–99)
Potassium: 3.4 mmol/L — ABNORMAL LOW (ref 3.5–5.1)
Sodium: 141 mmol/L (ref 135–145)

## 2019-05-02 LAB — HIV ANTIBODY (ROUTINE TESTING W REFLEX): HIV Screen 4th Generation wRfx: NONREACTIVE

## 2019-05-02 LAB — GLUCOSE, CAPILLARY
Glucose-Capillary: 92 mg/dL (ref 70–99)
Glucose-Capillary: 119 mg/dL — ABNORMAL HIGH (ref 70–99)

## 2019-05-02 MED ORDER — IPRATROPIUM-ALBUTEROL 0.5-2.5 (3) MG/3ML IN SOLN
3.0000 mL | Freq: Two times a day (BID) | RESPIRATORY_TRACT | Status: DC
  Administered 2019-05-02: 3 mL via RESPIRATORY_TRACT
  Filled 2019-05-02: qty 3

## 2019-05-02 MED ORDER — ZOLPIDEM TARTRATE ER 6.25 MG PO TBCR: 6.2500 mg | EXTENDED_RELEASE_TABLET | Freq: Every evening | ORAL | 0 refills | Status: DC | PRN

## 2019-05-02 MED ORDER — POTASSIUM CHLORIDE CRYS ER 20 MEQ PO TBCR
40.0000 meq | EXTENDED_RELEASE_TABLET | Freq: Once | ORAL | Status: AC
  Administered 2019-05-02: 09:00:00 40 meq via ORAL
  Filled 2019-05-02: qty 2

## 2019-05-02 MED ORDER — ENOXAPARIN SODIUM 40 MG/0.4ML ~~LOC~~ SOLN: 40.0000 mg | SUBCUTANEOUS | Status: DC

## 2019-05-02 NOTE — Plan of Care (Signed)
  Problem: Activity: Goal: Risk for activity intolerance will decrease Outcome: Progressing   Problem: Coping: Goal: Level of anxiety will decrease Outcome: Progressing   Problem: Elimination: Goal: Will not experience complications related to bowel motility Outcome: Progressing Goal: Will not experience complications related to urinary retention Outcome: Progressing   Problem: Safety: Goal: Ability to remain free from injury will improve Outcome: Progressing   

## 2019-05-02 NOTE — Evaluation (Signed)
Occupational Therapy Evaluation Patient Details Name: Kelly Gibson MRN: [id number] DOB: 10/29/1960 Today's Date: 05/02/2019    History of Present Illness 58 yo admitted with Acute kidney injury. PMhx: COPD, DM, HTN, HLD, CVA with Rt hemiparesis, depression   Clinical Impression   Pt is functioning at her baseline with the support of her husband at home. Reinforced ROM to R UE. No further OT needs.    Follow Up Recommendations  No OT follow up    Equipment Recommendations  None recommended by OT    Recommendations for Other Services       Precautions / Restrictions Precautions Precautions: Fall Precaution Comments: R hemiparesis Required Braces or Orthoses: Splint/Cast Splint/Cast: wears a R wrist cock up splint since she fell and broke her wrist Restrictions Weight Bearing Restrictions: No      Mobility Bed Mobility               General bed mobility comments: pt received on BSC  Transfers Overall transfer level: Needs assistance   Transfers: Sit to/from Stand;Stand Pivot Transfers Sit to Stand: Modified independent (Device/Increase time) Stand pivot transfers: Supervision       General transfer comment: supervised for safety    Balance                                           ADL either performed or assessed with clinical judgement   ADL Overall ADL's : At baseline                                             Vision Baseline Vision/History: No visual deficits Patient Visual Report: No change from baseline       Perception     Praxis      Pertinent Vitals/Pain Pain Assessment: No/denies pain     Hand Dominance     Extremity/Trunk Assessment Upper Extremity Assessment Upper Extremity Assessment: RUE deficits/detail RUE Deficits / Details: long standing hemiparesis RUE Coordination: decreased fine motor;decreased gross motor   Lower Extremity Assessment Lower Extremity Assessment: Defer to PT  evaluation       Communication Communication Communication: Expressive difficulties   Cognition Arousal/Alertness: Awake/alert Behavior During Therapy: WFL for tasks assessed/performed Overall Cognitive Status: History of cognitive impairments - at baseline                                 General Comments: per husband, pt with memory deficits   General Comments       Exercises     Shoulder Instructions      Home Living Family/patient expects to be discharged to:: Private residence Living Arrangements: Spouse/significant other Available Help at Discharge: Family;Available 24 hours/day Type of Home: House Home Access: Level entry     Home Layout: Two level;Bed/bath upstairs;Able to live on main level with bedroom/bathroom Alternate Level Stairs-Number of Steps: has chair lift   Bathroom Shower/Tub: Teacher, early years/pre: Standard     Home Equipment: Wheelchair - manual;Bedside commode;Other (comment);Hand held shower head;Shower seat   Additional Comments: hemiwalker      Prior Functioning/Environment Level of Independence: Needs assistance  Gait / Transfers Assistance Needed: walks with hemiwalker, transfers herself  ADL's /  Homemaking Assistance Needed: husband assists with bathing and sometimes dressing   Comments: participates in meal prep and light housekeeping        OT Problem List:        OT Treatment/Interventions:      OT Goals(Current goals can be found in the care plan section) Acute Rehab OT Goals Patient Stated Goal: to go home today  OT Frequency:     Barriers to D/C:            Co-evaluation              AM-PAC OT "6 Clicks" Daily Activity     Outcome Measure Help from another person eating meals?: None Help from another person taking care of personal grooming?: A Little Help from another person toileting, which includes using toliet, bedpan, or urinal?: A Little Help from another person bathing  (including washing, rinsing, drying)?: A Little Help from another person to put on and taking off regular upper body clothing?: None Help from another person to put on and taking off regular lower body clothing?: A Little 6 Click Score: 20   End of Session Nurse Communication: Other (comment)(pt is eager to d/c)  Activity Tolerance: Patient tolerated treatment well Patient left: in chair;with call bell/phone within reach;with family/visitor present  OT Visit Diagnosis: Unsteadiness on feet (R26.81);Other abnormalities of gait and mobility (R26.89);Hemiplegia and hemiparesis;Muscle weakness (generalized) (M62.81);Other symptoms and signs involving cognitive function Hemiplegia - Right/Left: Right Hemiplegia - dominant/non-dominant: Dominant Hemiplegia - caused by: Cerebral infarction                Time: 1207-1218 OT Time Calculation (min): 11 min Charges:  OT General Charges $OT Visit: 1 Visit OT Evaluation $OT Eval Moderate Complexity: 1 Mod  Nestor Lewandowsky, OTR/L Acute Rehabilitation Services Pager: [phone] Office: [phone]  Kelly Gibson 05/02/2019, 12:38 PM

## 2019-05-02 NOTE — Discharge Summary (Signed)
Discharge Summary  Kelly Gibson [id number] DOB: 10/30/1960  PCP: Alycia Rossetti, MD  Admit date: 04/28/2019 Discharge date: 05/02/2019  Time spent: 35 mins   Recommendations for Outpatient Follow-up:  1. PCP in 1 week, with repeat labs  Discharge Diagnoses:  Active Hospital Problems   Diagnosis Date Noted   AKI (acute kidney injury) ([place]) 04/28/2019   Acute urinary retention 04/28/2019   Hypokalemia    Hemiparesis affecting right side as late effect of cerebrovascular accident (CVA) ([place])    COPD (chronic obstructive pulmonary disease) ([place]) 02/18/2015   Essential hypertension, benign 12/17/2011   Obesity, unspecified 12/17/2011   Hyperlipidemia 12/17/2011   Diabetes mellitus ([place]) 12/14/2011    Resolved Hospital Problems  No resolved problems to display.    Discharge Condition: Stable  Diet recommendation: Heart healthy  Vitals:   05/02/19 0740 05/02/19 0803  BP: (!) 138/96   Pulse: 91 72  Resp: 16 (!) 22  Temp: 99.1 F (37.3 C)   SpO2: 96% 96%    History of present illness:  Kelly W Jonesis a 58 y.o.femalewith medical history significant ofCVA with residual right-sided weakness, COPD, diabetes, hypertension, hyperlipidemia presents to ED due to worsening kidney function.Patient reports that she was evaluated by her PCP last week and had regular lab work which showed worsening kidney function and she was advised to go to the ED. Patient reports chronic nausea and vomiting and incomplete sensation of bladder emptying for 1 month. She reports taking 3 tablets of Advil PM every night for years as sleep aid. Denies any other complaints. In the ED, Cr elevated from baseline, bladder scan shows 302 ml-post void residual volume.  Admitted for further management.    Today, patient denies any new complaints.  Denies any chest pain, shortness of breath, abdominal pain, nausea/vomiting, fever/chills.  Patient eager to be discharged.  Advised  close follow-up with PCP in 1 week with repeat labs  Hospital Course:  Principal Problem:   AKI (acute kidney injury) ([place]) Active Problems:   Diabetes mellitus ([place])   Essential hypertension, benign   Obesity, unspecified   Hyperlipidemia   COPD (chronic obstructive pulmonary disease) ([facility])   Hemiparesis affecting right side as late effect of cerebrovascular accident (CVA) ([place])   Hypokalemia   Acute urinary retention  AKI Creatinine 4.81 on admission---> 1.86 Resolving status post IV fluids Likely ATN 2/2 chronic daily NSAID use Vs ?Urinary retention/neurogenic bladder UA negative for infection Renal ultrasound showed normal left kidney and the right kidney had suboptimal imaging window due to patient's position.  Limited evaluation of the bladder due to decompression Advised to avoid all NSAIDs, or any other nephrotoxic's Follow-up with PCP in 1 week with repeat labs  ??Urinary retention Resolved Able to void without Foley catheter May benefit from urodynamic studies as an outpatient/urology consult, if urinary retention recurs  Hypokalemia Replaced PRN Follow-up with PCP  Diabetes mellitus type 2 Last A1c 7.5 on 04/11/2019 Continue home glyburide  Hypertension/hyperlipidemia Stable Continue amlodipine, Crestor  CVA with residual right-sided weakness Stable Continue aspirin, Crestor PT/OT-no further recommendation  COPD Stable Chest x-ray unremarkable Continue home inhalers       Estimated body mass index is 30.11 kg/m as calculated from the following:   Height as of this encounter: 5\' 3"  (1.6 m).   Weight as of this encounter: 77.1 kg.    Procedures:  None  Consultations:  None  Discharge Exam: BP (!) 138/96 (BP Location: Left Arm)    Pulse 72  Temp 99.1 F (37.3 C) (Oral)    Resp (!) 22    Ht 5\' 3"  (1.6 m)    Wt 77.1 kg    LMP 12/10/2012    SpO2 96%    BMI 30.11 kg/m   General: NAD Cardiovascular: S1-S2 present Respiratory: CTA  B  Discharge Instructions You were cared for by a hospitalist during your hospital stay. If you have any questions about your discharge medications or the care you received while you were in the hospital after you are discharged, you can call the unit and asked to speak with the hospitalist on call if the hospitalist that took care of you is not available. Once you are discharged, your primary care physician will handle any further medical issues. Please note that NO REFILLS for any discharge medications will be authorized once you are discharged, as it is imperative that you return to your primary care physician (or establish a relationship with a primary care physician if you do not have one) for your aftercare needs so that they can reassess your need for medications and monitor your lab values.   Allergies as of 05/02/2019      Reactions   Niacin And Related Rash      Medication List    STOP taking these medications   ibuprofen 200 MG tablet Commonly known as: ADVIL     TAKE these medications   albuterol 108 (90 Base) MCG/ACT inhaler Commonly known as: VENTOLIN HFA Inhale 2 puffs into the lungs every 6 (six) hours as needed for wheezing or shortness of breath.   amLODipine 10 MG tablet Commonly known as: NORVASC Take 1 tablet (10 mg total) by mouth daily. What changed: when to take this   aspirin 325 MG EC tablet Take 1 tablet (325 mg total) by mouth daily.   BLOOD GLUCOSE TEST STRIPS Strp Use as directed to monitor FSBS 2x daily. Dx: E11.9.   clotrimazole-betamethasone cream Commonly known as: Lotrisone Apply 1 application topically 2 (two) times daily. What changed: additional instructions   glyBURIDE 5 MG tablet Commonly known as: DIABETA Take 1 tablet (5 mg total) by mouth 2 (two) times daily with a meal.   Incruse Ellipta 62.5 MCG/INH Aepb Generic drug: umeclidinium bromide Inhale 1 puff into the lungs daily.   rosuvastatin 40 MG tablet Commonly known as:  CRESTOR TAKE ONE TABLET BY MOUTH DAILY AT 6PM What changed:   how much to take  how to take this  when to take this  additional instructions   tiZANidine 4 MG tablet Commonly known as: Zanaflex Take 1 tablet (4 mg total) by mouth every 6 (six) hours as needed for muscle spasms.   zolpidem 6.25 MG CR tablet Commonly known as: Ambien CR Take 1 tablet (6.25 mg total) by mouth at bedtime as needed for sleep.      Allergies  Allergen Reactions   Niacin And Related Rash   Follow-up Information    [facility], Modena Nunnery, MD. Schedule an appointment as soon as possible for a visit in 1 week(s).   Specialty: Family Medicine Contact information: [address] [place] [address] [phone]        Pixie Casino, MD .   Specialty: Cardiology Contact information: [place] [address] [phone]            The results of significant diagnostics from this hospitalization (including imaging, microbiology, ancillary and laboratory) are listed below for reference.  Significant Diagnostic Studies: US Renal  Result Date: 04/28/2019 CLINICAL DATA:  Increased BUN and creatinine, AK I EXAM: RENAL / URINARY TRACT ULTRASOUND COMPLETE COMPARISON:  None. FINDINGS: Right Kidney: Suboptimal imaging window. Patient paralysis limiting ability to reposition patient. Renal measurements: 7.0 x 4.1 x 4.3 cm = volume: 64.8 mL . Echogenicity within normal limits. No mass or hydronephrosis seen within the visualized portions of the right kidney. Left Kidney: Renal measurements: 10 x 5.3 x 5.0 = volume: 137.1 mL. Echogenicity within normal limits. No mass or hydronephrosis visualized. Bladder: Largely decompressed and poorly evaluated by ultrasound. IMPRESSION: Suboptimal imaging window of the right kidney due to difficulty with patient positioning. No gross right or left renal abnormality is seen. No hydronephrosis or visible urolithiasis. Limited  evaluation of bladder due to decompression. Electronically Signed   By: Lovena Le M.D.   On: 04/28/2019 19:59   Dg Chest Port 1 View  Result Date: 05/01/2019 CLINICAL DATA:  Shortness of breath. EXAM: PORTABLE CHEST 1 VIEW COMPARISON:  Radiographs 08/15/2016 and 03/27/2014.  CT 12/08/2017. FINDINGS: 1641 hours. The heart size and mediastinal contours are stable. Mild aortic atherosclerosis. There is stable mild scarring at both lung bases. No edema, confluent airspace opacity, pleural effusion or pneumothorax. The bones appear unchanged. IMPRESSION: Stable chest.  No evidence of active cardiopulmonary process. Electronically Signed   By: Richardean Sale M.D.   On: 05/01/2019 17:02    Microbiology: Recent Results (from the past 240 hour(s))  SARS CORONAVIRUS 2 (TAT 6-24 HRS) Nasopharyngeal Nasopharyngeal Swab     Status: None   Collection Time: 04/28/19  3:13 PM   Specimen: Nasopharyngeal Swab  Result Value Ref Range Status   SARS Coronavirus 2 NEGATIVE NEGATIVE Final    Comment: (NOTE) SARS-CoV-2 target nucleic acids are NOT DETECTED. The SARS-CoV-2 RNA is generally detectable in upper and lower respiratory specimens during the acute phase of infection. Negative results do not preclude SARS-CoV-2 infection, do not rule out co-infections with other pathogens, and should not be used as the sole basis for treatment or other patient management decisions. Negative results must be combined with clinical observations, patient history, and epidemiological information. The expected result is Negative. Fact Sheet for Patients: SugarRoll.be Fact Sheet for Healthcare Providers: https://www.woods-mathews.com/ This test is not yet approved or cleared by the [place] FDA and  has been authorized for detection and/or diagnosis of SARS-CoV-2 by FDA under an Emergency Use Authorization (EUA). This EUA will remain  in effect (meaning this test can be used)  for the duration of the COVID-19 declaration under Section 56 4(b)(1) of the Act, 21 U.S.C. section 360bbb-3(b)(1), unless the authorization is terminated or revoked sooner. Performed at [facility], [place] [address] [place], [place] [address]      Labs: Basic Metabolic Panel: Recent Labs  Lab 04/28/19 1355 04/29/19 0530 04/30/19 0537 05/01/19 0359 05/02/19 0519  NA 140 139 142 141 141  K 3.3* 3.0* 2.8* 3.5 3.4*  CL 105 111 111 114* 107  CO2 21* 20* 19* 18* 21*  GLUCOSE 191* 99 64* 69* 88  BUN 33* 28* 23* 20 18  CREATININE 4.22* 3.43* 2.65* 2.23* 1.86*  CALCIUM 9.4 8.5* 8.3* 8.7* 9.4   Liver Function Tests: Recent Labs  Lab 04/28/19 1355  AST 17  ALT 11  ALKPHOS 103  BILITOT 0.4  PROT 7.7  ALBUMIN 4.0   No results for input(s): LIPASE, AMYLASE in the last 168 hours. No results for input(s): AMMONIA in the last  168 hours. CBC: Recent Labs  Lab 04/28/19 1355 04/29/19 0530  WBC 6.4 7.7  NEUTROABS 4.1  --   HGB 13.6 12.1  HCT 41.3 35.0*  MCV 85.9 84.3  PLT 352 303   Cardiac Enzymes: No results for input(s): CKTOTAL, CKMB, CKMBINDEX, TROPONINI in the last 168 hours. BNP: BNP (last 3 results) No results for input(s): BNP in the last 8760 hours.  ProBNP (last 3 results) No results for input(s): PROBNP in the last 8760 hours.  CBG: Recent Labs  Lab 05/01/19 1302 05/01/19 1636 05/01/19 2104 05/02/19 0644 05/02/19 1129  GLUCAP 112* 134* 164* 92 119*       Signed:  Alma Friendly, MD [facility] 05/02/2019, 12:19 PM

## 2019-05-02 NOTE — Evaluation (Signed)
Physical Therapy Evaluation/ Discharge Patient Details Name: Kelly Gibson MRN: [id number] DOB: 07/21/1961 Today's Date: 05/02/2019   History of Present Illness  58 yo admitted with Acute kidney injury. PMhx: COPD, DM, HTN, HLD, CVA with Rt hemiparesis, depression  Clinical Impression  Pt very pleasant and reports baseline mobility limited to in home. She uses a hemiwalker at all times and W/C for community with pt reporting ability to perform ADLs without assist at baseline. Pt reports one fall in the last year with consistent help of family at home as needed. Pt noted to have wheezing respirations with gait with report of 2/4 difficulty breathing with Spo2 98% on RA and HR 127. Recommend daily mobility with nursing staff acutely without further acute therapy needs as pt at her baseline functional level per her report.      Follow Up Recommendations No PT follow up    Equipment Recommendations  None recommended by PT    Recommendations for Other Services       Precautions / Restrictions Precautions Precautions: Fall Precaution Comments: R hemiparesis Required Braces or Orthoses: Splint/Cast Splint/Cast: wears a R wrist cock up splint since she fell and broke her wrist Restrictions Weight Bearing Restrictions: No      Mobility  Bed Mobility Overal bed mobility: Modified Independent             General bed mobility comments: pt received on BSC  Transfers Overall transfer level: Modified independent   Transfers: Sit to/from [facility] Sit to Stand: Modified independent (Device/Increase time) Stand pivot transfers: Supervision       General transfer comment: pt able to stand from bed without assist  Ambulation/Gait Ambulation/Gait assistance: Modified independent (Device/Increase time) Gait Distance (Feet): 80 Feet Assistive device: Hemi-walker Gait Pattern/deviations: Step-to pattern;Trunk flexed;Decreased dorsiflexion - right;Decreased  stance time - right   Gait velocity interpretation: 1.31 - 2.62 ft/sec, indicative of limited community ambulator General Gait Details: Pt with hemiparetic gait with decreased stance on RLE with use of hemiwalker and RLE drag with foot drop. Pt stable with slow gait with use of hemiwalker with noted wheezing with gait with SPo2 98% on RA and HR 127 with limited gait  Stairs            Wheelchair Mobility    Modified Rankin (Stroke Patients Only)       Balance Overall balance assessment: Needs assistance Sitting-balance support: No upper extremity supported;Feet supported Sitting balance-Leahy Scale: Fair     Standing balance support: Single extremity supported Standing balance-Leahy Scale: Poor Standing balance comment: pt with use of hemiwalker in standing                             Pertinent Vitals/Pain Pain Assessment: No/denies pain    Home Living Family/patient expects to be discharged to:: Private residence Living Arrangements: Spouse/significant other Available Help at Discharge: Family;Available 24 hours/day Type of Home: House Home Access: Level entry     Home Layout: Two level;Bed/bath upstairs;Able to live on main level with bedroom/bathroom Home Equipment: Wheelchair - manual;Bedside commode;Tub bench;Other (comment);Hand held shower head Additional Comments: hemiwalker    Prior Function Level of Independence: Independent with assistive device(s)   Gait / Transfers Assistance Needed: walks with hemiwalker, transfers herself   ADL's / Homemaking Assistance Needed: husband assists with bathing and sometimes dressing  Comments: walks with hemiwalker, sits to bathe, assists with housework and cooking, pt drives     Hand  Dominance        Extremity/Trunk Assessment   Upper Extremity Assessment Upper Extremity Assessment: RUE deficits/detail RUE Deficits / Details: long standing hemiparesis RUE Coordination: decreased fine  motor;decreased gross motor    Lower Extremity Assessment Lower Extremity Assessment: RLE deficits/detail RLE Deficits / Details: long standing hemiparesis with foot drop    Cervical / Trunk Assessment Cervical / Trunk Assessment: Normal  Communication   Communication: Expressive difficulties  Cognition Arousal/Alertness: Awake/alert Behavior During Therapy: WFL for tasks assessed/performed Overall Cognitive Status: History of cognitive impairments - at baseline                                 General Comments: per husband, pt with memory deficits      General Comments      Exercises     Assessment/Plan    PT Assessment Patent does not need any further PT services  PT Problem List         PT Treatment Interventions      PT Goals (Current goals can be found in the Care Plan section)  Acute Rehab PT Goals Patient Stated Goal: to go home today PT Goal Formulation: All assessment and education complete, DC therapy    Frequency     Barriers to discharge        Co-evaluation               AM-PAC PT "6 Clicks" Mobility  Outcome Measure Help needed turning from your back to your side while in a flat bed without using bedrails?: None Help needed moving from lying on your back to sitting on the side of a flat bed without using bedrails?: None Help needed moving to and from a bed to a chair (including a wheelchair)?: None Help needed standing up from a chair using your arms (e.g., wheelchair or bedside chair)?: None Help needed to walk in hospital room?: A Little Help needed climbing 3-5 steps with a railing? : A Lot 6 Click Score: 21    End of Session   Activity Tolerance: Patient tolerated treatment well Patient left: in chair;with chair alarm set;with call bell/phone within reach;with nursing/sitter in room Nurse Communication: Mobility status PT Visit Diagnosis: Other abnormalities of gait and mobility (R26.89)    Time: [id number] PT Time  Calculation (min) (ACUTE ONLY): 18 min   Charges:   PT Evaluation $PT Eval Moderate Complexity: 1 Mod          [place] Pager: [phone] Office: [place] 05/02/2019, 12:50 PM

## 2019-05-02 NOTE — Plan of Care (Signed)

## 2019-05-02 NOTE — Progress Notes (Signed)
[name] to be D/C'd  per MD order. Discussed with the patient and all questions fully answered.  VSS, Skin clean, dry and intact without evidence of skin break down, no evidence of skin tears noted.  IV catheter discontinued intact. Site without signs and symptoms of complications. Dressing and pressure applied.  An After Visit Summary was printed and given to the patient. Patient received prescription.  D/c education completed with patient/family including follow up instructions, medication list, d/c activities limitations if indicated, with other d/c instructions as indicated by MD - patient able to verbalize understanding, all questions fully answered.   Patient instructed to return to ED, call 911, or call MD for any changes in condition.   Patient to be escorted via [place], and D/C home via private auto.

## 2019-05-04 ENCOUNTER — Ambulatory Visit: Payer: Self-pay | Admitting: Adult Health

## 2019-05-15 ENCOUNTER — Other Ambulatory Visit: Payer: Self-pay

## 2019-05-16 ENCOUNTER — Emergency Department (HOSPITAL_COMMUNITY): Payer: BC Managed Care – PPO

## 2019-05-16 ENCOUNTER — Encounter (HOSPITAL_COMMUNITY): Payer: Self-pay

## 2019-05-16 ENCOUNTER — Other Ambulatory Visit: Payer: Self-pay

## 2019-05-16 ENCOUNTER — Emergency Department (HOSPITAL_COMMUNITY)
Admission: EM | Admit: 2019-05-16 | Discharge: 2019-05-16 | Discharge status: Against Medical Advice | Payer: BC Managed Care – PPO | Attending: Emergency Medicine | Admitting: Emergency Medicine

## 2019-05-16 ENCOUNTER — Encounter: Payer: Self-pay | Admitting: Family Medicine

## 2019-05-16 ENCOUNTER — Ambulatory Visit (INDEPENDENT_AMBULATORY_CARE_PROVIDER_SITE_OTHER): Payer: BC Managed Care – PPO | Admitting: Family Medicine

## 2019-05-16 VITALS — BP 124/76 | HR 84 | Resp 18

## 2019-05-16 DIAGNOSIS — E1122 Type 2 diabetes mellitus with diabetic chronic kidney disease: Secondary | ICD-10-CM

## 2019-05-16 DIAGNOSIS — I10 Essential (primary) hypertension: Secondary | ICD-10-CM | POA: Diagnosis not present

## 2019-05-16 DIAGNOSIS — R079 Chest pain, unspecified: Secondary | ICD-10-CM

## 2019-05-16 DIAGNOSIS — N1832 Chronic kidney disease, stage 3b: Secondary | ICD-10-CM

## 2019-05-16 DIAGNOSIS — R339 Retention of urine, unspecified: Secondary | ICD-10-CM

## 2019-05-16 DIAGNOSIS — E1121 Type 2 diabetes mellitus with diabetic nephropathy: Secondary | ICD-10-CM

## 2019-05-16 DIAGNOSIS — Z5321 Procedure and treatment not carried out due to patient leaving prior to being seen by health care provider: Secondary | ICD-10-CM | POA: Insufficient documentation

## 2019-05-16 DIAGNOSIS — N1831 Chronic kidney disease, stage 3a: Secondary | ICD-10-CM | POA: Insufficient documentation

## 2019-05-16 DIAGNOSIS — N183 Chronic kidney disease, stage 3 unspecified: Secondary | ICD-10-CM | POA: Insufficient documentation

## 2019-05-16 DIAGNOSIS — R0789 Other chest pain: Secondary | ICD-10-CM | POA: Diagnosis present

## 2019-05-16 LAB — CBC
HCT: 36.1 % (ref 36.0–46.0)
Hemoglobin: 11.7 g/dL — ABNORMAL LOW (ref 12.0–15.0)
MCH: 28.1 pg (ref 26.0–34.0)
MCHC: 32.4 g/dL (ref 30.0–36.0)
MCV: 86.8 fL (ref 80.0–100.0)
Platelets: 341 10*3/uL (ref 150–400)
RBC: 4.16 MIL/uL (ref 3.87–5.11)
RDW: 13.2 % (ref 11.5–15.5)
WBC: 7.5 10*3/uL (ref 4.0–10.5)
nRBC: 0 % (ref 0.0–0.2)

## 2019-05-16 LAB — BASIC METABOLIC PANEL
Anion gap: 12 (ref 5–15)
BUN: 16 mg/dL (ref 6–20)
CO2: 24 mmol/L (ref 22–32)
Calcium: 9.1 mg/dL (ref 8.9–10.3)
Chloride: 104 mmol/L (ref 98–111)
Creatinine, Ser: 1.72 mg/dL — ABNORMAL HIGH (ref 0.44–1.00)
GFR calc Af Amer: 38 mL/min — ABNORMAL LOW (ref >60)
GFR calc non Af Amer: 33 mL/min — ABNORMAL LOW (ref >60)
Glucose, Bld: 135 mg/dL — ABNORMAL HIGH (ref 70–99)
Potassium: 3 mmol/L — ABNORMAL LOW (ref 3.5–5.1)
Sodium: 140 mmol/L (ref 135–145)

## 2019-05-16 LAB — TROPONIN I (HIGH SENSITIVITY)
Troponin I (High Sensitivity): 11 ng/L (ref <18)
Troponin I (High Sensitivity): 11 ng/L (ref <18)

## 2019-05-16 LAB — I-STAT BETA HCG BLOOD, ED (MC, WL, AP ONLY): I-stat hCG, quantitative: <5 m[IU]/mL (ref <5)

## 2019-05-16 MED ORDER — NITROGLYCERIN 0.4 MG SL SUBL
0.4000 mg | SUBLINGUAL_TABLET | Freq: Once | SUBLINGUAL | Status: AC
  Administered 2019-05-16: 0.4 mg via SUBLINGUAL

## 2019-05-16 MED ORDER — SODIUM CHLORIDE 0.9% FLUSH: 3.0000 mL | Freq: Once | INTRAVENOUS | Status: DC

## 2019-05-16 NOTE — ED Notes (Signed)
Pt very nicely stated that she could not wait any longer and that she would come back if necessary.  Pt has left the building.

## 2019-05-16 NOTE — Assessment & Plan Note (Signed)
Continue glyburide

## 2019-05-16 NOTE — ED Triage Notes (Signed)
Pt reports chest pain that started today as she was getting out of the car, describes as a tightness associated with some SOB. Pt denies any other symptoms, pt a.o, nad noted. resp e.u

## 2019-05-16 NOTE — Assessment & Plan Note (Signed)
>>  ASSESSMENT AND PLAN FOR ESSENTIAL HYPERTENSION, BENIGN WRITTEN ON 05/16/2019  4:13 PM BY Vic Blackbird F  Patient here with substernal chest pain that started when she arrived at the office.  EKG was done which did not show anything acute but she does have old infarct changes on the EKG compared to previous.  Her blood pressure was also elevated at 164/96.  She was given nitroglycerin at 3:50 PM.  She has significant risk factors for coronary artery disease with her diabetes hypertension stroke and the old EKG changes.  On Reassessment- chest pain went from an 8 to a 4 after Nitroglycerin   Pt refused EMS transport, husband will take her to [facility] hospital  If troponin are negative and CXR clear, can set up outpatient cardiology   will also start metoprolol 12.5mg  XL once a day

## 2019-05-16 NOTE — Assessment & Plan Note (Addendum)
Patient here with substernal chest pain that started when she arrived at the office.  EKG was done which did not show anything acute but she does have old infarct changes on the EKG compared to previous.  Her blood pressure was also elevated at 164/96.  She was given nitroglycerin at 3:50 PM.  She has significant risk factors for coronary artery disease with her diabetes hypertension stroke and the old EKG changes.  On Reassessment- chest pain went from an 8 to a 4 after Nitroglycerin   Pt refused EMS transport, husband will take her to [facility] hospital  If troponin are negative and CXR clear, can set up outpatient cardiology   will also start metoprolol 12.5mg  XL once a day

## 2019-05-16 NOTE — Progress Notes (Signed)
Subjective:    Patient ID: Kelly Gibson, female    DOB: 05/17/1961, 58 y.o.   MRN: [id number]  Patient presents for Hospitalization Follow-up and Chest Pain Patient here for hospital follow-up.  She was mid to the hospital after did her routine labs showing acute on chronic renal failure.  She has had episodes of nausea and vomiting which is been chronic.  It was also concerned that she had urinary retention had not been emptying her bladder all the way for the past month. Creatinine on admission was 4.81 at discharge was down to 1.86.  Thought this was secondary to ATN with NSAID use in possible neurogenic bladder in the setting of her diabetes mellitus.  Her urinalysis did not show any infection her renal ultrasound was fairly unremarkable.  She was able to void without a Foley catheter at discharge but it was recommended that she had urology follow-up as an outpatient Her lisinopril had already discontinued prior to her going to the hospital She was continued on the amlodipine for her blood pressure.  For her diabetes mellitus she was continued on her home glimepiride A1c was 7.5% she was continued on her statin drug aspirin  Patient came into the office with her husband she started complaining of substernal chest pain.  States that it started when she got out of the car.  He states they have been lasting just in the entire way down.  She did build but there was minimal improvement.  Once we got her up on the table to try to do an EKG she started complaining of more chest discomfort.  Her husband her blood pressure has been doing okay this morning she states it was 140s over 80 she did have 1 high blood pressure the other day.  She typically takes her amlodipine at bedtime  Review Of Systems:  GEN- denies fatigue, fever, weight loss,weakness, recent illness HEENT- denies eye drainage, change in vision, nasal discharge, CVS-+chest pain,denies  palpitations RESP- denies SOB, cough,  wheeze ABD- denies N/V, change in stools, abd pain GU- denies dysuria, hematuria, dribbling, incontinence MSK- denies joint pain, muscle aches, injury Neuro- denies headache, dizziness, syncope, seizure activity       Objective:    BP 124/76   Pulse 84   Resp 18   LMP 12/10/2012   SpO2 100%  GEN- NAD, alert and oriented x3, in wheelchair  HEENT- PERRL, EOMI, non injected sclera, pink conjunctiva, MMM, oropharynx clear Neck- Supple, no thyromegaly CVS- RRR, no murmur RESP-CTAB ABD-NABS,soft,NT,ND Neuro- hemiparesis right side, dysarthria EXT- No edema Pulses- Radial  2+  EKG-NSR, old infart in ant lateral leads with q waves, compared to previous EKG unchanged      Assessment & Plan:      Problem List Items Addressed This Visit      Unprioritized   Chronic kidney disease (CKD), stage III (moderate)   Diabetes mellitus ([place])    Continue glyburide      Essential hypertension, benign - Primary    Patient here with substernal chest pain that started when she arrived at the office.  EKG was done which did not show anything acute but she does have old infarct changes on the EKG compared to previous.  Her blood pressure was also elevated at 164/96.  She was given nitroglycerin at 3:50 PM.  She has significant risk factors for coronary artery disease with her diabetes hypertension stroke and the old EKG changes.  On Reassessment- chest pain went from an  8 to a 4 after Nitroglycerin   Pt refused EMS transport, husband will take her to [facility] hospital  If troponin are negative and CXR clear, can set up outpatient cardiology   will also start metoprolol 12.5mg  XL once a day        Other Visit Diagnoses    Chest pain, unspecified type       Relevant Medications   nitroGLYCERIN (NITROSTAT) SL tablet 0.4 mg (Completed) (Start on 05/16/2019  4:15 PM)   Other Relevant Orders   EKG 12-Lead (Completed)   Urinary retention       Urology referral   Relevant Orders   Ambulatory  referral to Urology      Note: This dictation was prepared with Dragon dictation along with smaller phrase technology. Any transcriptional errors that result from this process are unintentional.

## 2019-05-16 NOTE — Assessment & Plan Note (Signed)
>>  ASSESSMENT AND PLAN FOR TYPE 2 DIABETES MELLITUS WITH DIABETIC NEUROPATHY, WITH LONG-TERM CURRENT USE OF INSULIN  (HCC) WRITTEN ON 05/16/2019  3:55 PM BY Chisholm, KAWANTA F  Continue glyburide

## 2019-05-17 ENCOUNTER — Ambulatory Visit: Payer: Self-pay | Admitting: Adult Health

## 2019-05-18 ENCOUNTER — Emergency Department (HOSPITAL_COMMUNITY)
Admission: EM | Admit: 2019-05-18 | Discharge: 2019-05-18 | Disposition: A | Discharge status: Home / Self Care | Payer: BC Managed Care – PPO | Attending: Emergency Medicine | Admitting: Emergency Medicine

## 2019-05-18 ENCOUNTER — Encounter (HOSPITAL_COMMUNITY): Payer: Self-pay | Admitting: Emergency Medicine

## 2019-05-18 ENCOUNTER — Telehealth: Payer: Self-pay | Admitting: *Deleted

## 2019-05-18 ENCOUNTER — Other Ambulatory Visit: Payer: Self-pay

## 2019-05-18 DIAGNOSIS — I10 Essential (primary) hypertension: Secondary | ICD-10-CM | POA: Insufficient documentation

## 2019-05-18 DIAGNOSIS — N179 Acute kidney failure, unspecified: Secondary | ICD-10-CM | POA: Diagnosis not present

## 2019-05-18 DIAGNOSIS — Z7982 Long term (current) use of aspirin: Secondary | ICD-10-CM | POA: Insufficient documentation

## 2019-05-18 DIAGNOSIS — J449 Chronic obstructive pulmonary disease, unspecified: Secondary | ICD-10-CM | POA: Diagnosis not present

## 2019-05-18 DIAGNOSIS — Z79899 Other long term (current) drug therapy: Secondary | ICD-10-CM | POA: Diagnosis not present

## 2019-05-18 DIAGNOSIS — Z87891 Personal history of nicotine dependence: Secondary | ICD-10-CM | POA: Insufficient documentation

## 2019-05-18 DIAGNOSIS — R339 Retention of urine, unspecified: Secondary | ICD-10-CM | POA: Diagnosis present

## 2019-05-18 DIAGNOSIS — E119 Type 2 diabetes mellitus without complications: Secondary | ICD-10-CM | POA: Diagnosis not present

## 2019-05-18 DIAGNOSIS — Z7984 Long term (current) use of oral hypoglycemic drugs: Secondary | ICD-10-CM | POA: Insufficient documentation

## 2019-05-18 LAB — CBC WITH DIFFERENTIAL/PLATELET
Abs Immature Granulocytes: 0.02 10*3/uL (ref 0.00–0.07)
Basophils Absolute: 0.1 10*3/uL (ref 0.0–0.1)
Basophils Relative: 1 %
Eosinophils Absolute: 0.2 10*3/uL (ref 0.0–0.5)
Eosinophils Relative: 2 %
HCT: 37.4 % (ref 36.0–46.0)
Hemoglobin: 12 g/dL (ref 12.0–15.0)
Immature Granulocytes: 0 %
Lymphocytes Relative: 23 %
Lymphs Abs: 1.8 10*3/uL (ref 0.7–4.0)
MCH: 28.2 pg (ref 26.0–34.0)
MCHC: 32.1 g/dL (ref 30.0–36.0)
MCV: 88 fL (ref 80.0–100.0)
Monocytes Absolute: 0.7 10*3/uL (ref 0.1–1.0)
Monocytes Relative: 9 %
Neutro Abs: 5 10*3/uL (ref 1.7–7.7)
Neutrophils Relative %: 65 %
Platelets: 327 10*3/uL (ref 150–400)
RBC: 4.25 MIL/uL (ref 3.87–5.11)
RDW: 13.4 % (ref 11.5–15.5)
WBC: 7.6 10*3/uL (ref 4.0–10.5)
nRBC: 0 % (ref 0.0–0.2)

## 2019-05-18 LAB — URINALYSIS, ROUTINE W REFLEX MICROSCOPIC
Bilirubin Urine: NEGATIVE
Glucose, UA: NEGATIVE mg/dL
Ketones, ur: NEGATIVE mg/dL
Leukocytes,Ua: NEGATIVE
Nitrite: NEGATIVE
Protein, ur: 100 mg/dL — AB
Specific Gravity, Urine: 1.016 (ref 1.005–1.030)
pH: 6 (ref 5.0–8.0)

## 2019-05-18 LAB — BASIC METABOLIC PANEL
Anion gap: 13 (ref 5–15)
BUN: 18 mg/dL (ref 6–20)
CO2: 23 mmol/L (ref 22–32)
Calcium: 9.3 mg/dL (ref 8.9–10.3)
Chloride: 105 mmol/L (ref 98–111)
Creatinine, Ser: 2.25 mg/dL — ABNORMAL HIGH (ref 0.44–1.00)
GFR calc Af Amer: 27 mL/min — ABNORMAL LOW (ref >60)
GFR calc non Af Amer: 24 mL/min — ABNORMAL LOW (ref >60)
Glucose, Bld: 139 mg/dL — ABNORMAL HIGH (ref 70–99)
Potassium: 3.6 mmol/L (ref 3.5–5.1)
Sodium: 141 mmol/L (ref 135–145)

## 2019-05-18 MED ORDER — ALBUTEROL SULFATE HFA 108 (90 BASE) MCG/ACT IN AERS: 2.0000 | INHALATION_SPRAY | RESPIRATORY_TRACT | Status: DC | PRN

## 2019-05-18 MED ORDER — SODIUM CHLORIDE 0.9 % IV BOLUS: 1000.0000 mL | Freq: Once | INTRAVENOUS | Status: DC

## 2019-05-18 NOTE — Discharge Instructions (Signed)
Please follow up with your doctor or with urologist for further evaluation of your urinary discomfort and difficulty urinating.  Return if you have any concerns.

## 2019-05-18 NOTE — ED Notes (Signed)
Pt husband assisted pt to restroom. Pt voided urine with no issue. Sample was not obtained. EDP made aware.

## 2019-05-18 NOTE — ED Provider Notes (Signed)
[facility] EMERGENCY DEPARTMENT Provider Note   CSN: [id number] Arrival date & time: 05/18/19  [id number]     History   Chief Complaint Chief Complaint  Patient presents with  . Unable to Void    HPI Kelly Gibson is a 58 y.o. female.     The history is provided by the patient and medical records. No language interpreter was used.     58 year old female with history of chronic kidney disease, prior stroke causing right-sided deficit, COPD, diabetes presenting for difficulty urinating.  Patient reports since last night she is having trouble urinating.  States that she feels pressure to her lower abdomen, unable to making urine.  She does endorse some mild nausea without vomiting.  She does not have any fever chills or back pain.  She denies any dysuria prior to this incident.  She did recall having similar urinary retention 2 weeks ago when she was admitted to the hospital did subsequently resolved.  Patient lives at home with her husband, she use a wheelchair to move about.  She does not have any other symptoms any other complaint.  Past Medical History:  Diagnosis Date  . Bradycardia    SR/SB with up to 3.5 second pause (asymptomatic) on ~ 10/2017 event monitor; saw EP Dr. Rayann Heman: avoid AV nodal blocking agents  . COPD (chronic obstructive pulmonary disease) ([place])   . Depression   . Diabetes mellitus   . Hyperlipidemia   . Hypertension   . PONV (postoperative nausea and vomiting)   . Stroke [facility])     Patient Active Problem List   Diagnosis Date Noted  . Chronic kidney disease (CKD), stage III (moderate) 05/16/2019  . Acute urinary retention 04/28/2019  . Hypokalemia   . Dysphagia, post-stroke   . Hemiparesis affecting right side as late effect of cerebrovascular accident (CVA) ([place])   . Dysarthria, post-stroke   . Gait disturbance, post-stroke   . Bradycardia   . Weakness 10/26/2017  . Cerebrovascular disease 10/18/2017  . COPD (chronic obstructive  pulmonary disease) ([place]) 02/18/2015  . Non compliance w medication regimen 06/17/2013  . Ganglion cyst 03/09/2012  . Anxiety 03/09/2012  . Essential hypertension, benign 12/17/2011  . Obesity, unspecified 12/17/2011  . Hyperlipidemia 12/17/2011  . Depression 12/17/2011  . Diabetes mellitus ([place]) 12/14/2011    Past Surgical History:  Procedure Laterality Date  . CESAREAN SECTION     x 3  . ORIF WRIST FRACTURE Right 05/04/2018   Procedure: OPEN REDUCTION INTERNAL FIXATION (ORIF) RIGHT  WRIST FRACTURE;  Surgeon: Hiram Gash, MD;  Location: [facility];  Service: Orthopedics;  Laterality: Right;  . TUMOR REMOVAL     left shoulder     OB History   No obstetric history on file.      Home Medications    Prior to Admission medications   Medication Sig Start Date End Date Taking? Authorizing Provider  albuterol (VENTOLIN HFA) 108 (90 Base) MCG/ACT inhaler Inhale 2 puffs into the lungs every 6 (six) hours as needed for wheezing or shortness of breath. 01/09/19   [place], Modena Nunnery, MD  amLODipine (NORVASC) 10 MG tablet Take 1 tablet (10 mg total) by mouth daily. Patient taking differently: Take 10 mg by mouth at bedtime.  01/09/19   Alycia Rossetti, MD  aspirin EC 325 MG EC tablet Take 1 tablet (325 mg total) by mouth daily. 12/10/17   Geradine Girt, DO  clotrimazole-betamethasone (LOTRISONE) cream Apply 1 application topically 2 (two) times  daily. Patient taking differently: Apply 1 application topically 2 (two) times daily. BEHIND THE EARS 04/11/19   Alycia Rossetti, MD  Glucose Blood (BLOOD GLUCOSE TEST STRIPS) STRP Use as directed to monitor FSBS 2x daily. Dx: E11.9. 01/04/19   Alycia Rossetti, MD  glyBURIDE (DIABETA) 5 MG tablet Take 1 tablet (5 mg total) by mouth 2 (two) times daily with a meal. 04/13/19   [place], Modena Nunnery, MD  rosuvastatin (CRESTOR) 40 MG tablet TAKE ONE TABLET BY MOUTH DAILY AT 6PM Patient taking differently: Take 40 mg by mouth at bedtime.  01/09/19   [place],  Modena Nunnery, MD  tiZANidine (ZANAFLEX) 4 MG tablet Take 1 tablet (4 mg total) by mouth every 6 (six) hours as needed for muscle spasms. 01/09/19   [place], Modena Nunnery, MD  umeclidinium bromide (INCRUSE ELLIPTA) 62.5 MCG/INH AEPB Inhale 1 puff into the lungs daily. 01/09/19   [place], Modena Nunnery, MD  zolpidem (AMBIEN CR) 6.25 MG CR tablet Take 1 tablet (6.25 mg total) by mouth at bedtime as needed for sleep. 05/02/19   Alma Friendly, MD    Family History Family History  Problem Relation Age of Onset  . Diabetes Mother   . Stroke Mother   . Heart disease Father   . Cancer Father   . Stroke Father     Social History Social History   Tobacco Use  . Smoking status: Former Smoker    Packs/day: 1.00    Years: 20.00    Pack years: 20.00  . Smokeless tobacco: Never Used  . Tobacco comment: stopped 2018  Substance Use Topics  . Alcohol use: No  . Drug use: No     Allergies   Niacin and related   Review of Systems Review of Systems  All other systems reviewed and are negative.    Physical Exam Updated Vital Signs BP 124/68 (BP Location: Left Arm)   Pulse 79   Resp 18   Ht 5\' 3"  (1.6 m)   Wt 77.1 kg   LMP 12/10/2012 (Exact Date)   SpO2 100%   BMI 30.11 kg/m   Physical Exam Vitals signs and nursing note reviewed.  Constitutional:      General: She is not in acute distress.    Appearance: She is well-developed.  HENT:     Head: Atraumatic.  Eyes:     Conjunctiva/sclera: Conjunctivae normal.  Neck:     Musculoskeletal: Neck supple.  Cardiovascular:     Rate and Rhythm: Normal rate and regular rhythm.  Pulmonary:     Effort: Pulmonary effort is normal.     Breath sounds: Wheezing (Faint scattered wheezes) present.  Abdominal:     Palpations: Abdomen is soft.     Tenderness: There is abdominal tenderness (Mild suprapubic tenderness).  Skin:    Findings: No rash.  Neurological:     Mental Status: She is alert.     Comments: Right side arm and leg weakness  secondary to prior stroke.      ED Treatments / Results  Labs (all labs ordered are listed, but only abnormal results are displayed) Labs Reviewed  URINALYSIS, ROUTINE W REFLEX MICROSCOPIC - Abnormal; Notable for the following components:      Result Value   APPearance HAZY (*)    Hgb urine dipstick MODERATE (*)    Protein, ur 100 (*)    Bacteria, UA RARE (*)    All other components within normal limits  BASIC METABOLIC PANEL - Abnormal; Notable for  the following components:   Glucose, Bld 139 (*)    Creatinine, Ser 2.25 (*)    GFR calc non Af Amer 24 (*)    GFR calc Af Amer 27 (*)    All other components within normal limits  CBC WITH DIFFERENTIAL/PLATELET    EKG EKG Interpretation  Date/Time:  Thursday May 18 2019 11:55:37 EDT Ventricular Rate:  77 PR Interval:    QRS Duration: 91 QT Interval:  433 QTC Calculation: 491 R Axis:   -29 Text Interpretation:  Sinus rhythm Prolonged PR interval Inferior infarct, old no acute STEMI Confirmed by Madalyn Rob [phone]) on 05/18/2019 12:16:04 PM   Radiology Dg Chest 2 View  Result Date: 05/16/2019 CLINICAL DATA:  Chest pain, shortness of breath EXAM: CHEST - 2 VIEW COMPARISON:  Show 05/01/2019 FINDINGS: Heart size and cardiomediastinal contours are stable and mildly enlarged. Calcified atherosclerotic change in the aortic arch. Subtle density in the left costodiaphragmatic sulcus may represent a small pulmonary nodule measuring approximately 1.5 cm. This could represent an area of scarring. No signs of consolidation or evidence of pleural effusion. No acute bone finding. IMPRESSION: Subtle density in the left costodiaphragmatic sulcus may represent a small pulmonary nodule versus scarring. Given persistence and more nodular appearance consider follow-up chest CT to compare to study of 12/08/2017 These results were called by telephone at the time of interpretation on 05/16/2019 at 6:20 pm to provider Dr. Vanita Panda, who verbally  acknowledged these results. Electronically Signed   By: Zetta Bills M.D.   On: 05/16/2019 18:21   Ct Chest Wo Contrast  Result Date: 05/16/2019 CLINICAL DATA:  COPD exacerbation. Chest tightness. Stroke 2 months ago. EXAM: CT CHEST WITHOUT CONTRAST TECHNIQUE: Multidetector CT imaging of the chest was performed following the standard protocol without IV contrast. COMPARISON:  Today's chest radiograph.  Chest CT 12/08/2017. FINDINGS: Cardiovascular: Aortic atherosclerosis. Tortuous thoracic aorta. Mild cardiomegaly, without pericardial effusion. Multivessel coronary artery atherosclerosis. Mediastinum/Nodes: No supraclavicular adenopathy. No axillary adenopathy. Right paratracheal node measures 1.0 cm on 49/3 versus 1.2 cm on the prior. Index prevascular node measures 6 mm on 51/3 versus 9 mm on the prior. Soft tissue fullness in the AP window region, including on 51/3 is likely attributed to similar adenopathy, but not well evaluated on today's noncontrast exam. There is also similar soft tissue fullness within both hila, likely attributed to grossly similar adenopathy. Lungs/Pleura: No pleural fluid. Bibasilar scarring, most significant in the lateral left lower lobe including on 102/4. This is not significantly changed. This likely accounts for the plain film abnormality. The 5 mm right upper lobe pulmonary nodule is similar on 46/4 and can be presumed benign. Upper Abdomen: Normal imaged portions of the liver, spleen, stomach, pancreas, adrenal glands, kidneys. Bilateral renal vascular calcifications. Abdominal aortic atherosclerosis. Musculoskeletal: Lower thoracic spondylosis. IMPRESSION: 1. The left lower lobe abnormality on plain film was secondary to similar scarring. 2.  No acute process in the chest. 3. Thoracic adenopathy which is suboptimally evaluated on this noncontrast exam. Similar and mildly improved relative to 12/08/2017, favoring a benign/reactive etiology. 4. Age advanced coronary artery  atherosclerosis. Recommend assessment of coronary risk factors and consideration of medical therapy. 5.  Aortic Atherosclerosis (ICD10-I70.0). Electronically Signed   By: Abigail Miyamoto M.D.   On: 05/16/2019 19:39    Procedures Procedures (including critical care time)  Medications Ordered in ED Medications  albuterol (VENTOLIN HFA) 108 (90 Base) MCG/ACT inhaler 2 puff (has no administration in time range)  sodium chloride 0.9 % bolus  1,000 mL (0 mLs Intravenous Hold 05/18/19 1232)     Initial Impression / Assessment and Plan / ED Course  I have reviewed the triage vital signs and the nursing notes.  Pertinent labs & imaging results that were available during my care of the patient were reviewed by me and considered in my medical decision making (see chart for details).        BP 124/68 (BP Location: Left Arm)   Pulse 79   Temp 98.5 F (36.9 C) (Oral)   Resp 18   Ht 5\' 3"  (1.6 m)   Wt 77.1 kg   LMP 12/10/2012 (Exact Date)   SpO2 100%   BMI 30.11 kg/m    Final Clinical Impressions(s) / ED Diagnoses   Final diagnoses:  AKI (acute kidney injury) [facility])    ED Discharge Orders    None     11:32 AM Patient here with complaints of acute urinary retention since last night.  Did report one similar episode 2 weeks ago when she was hospitalized for AKI.  On exam she does have some faint wheezes.  She does have underlying history of COPD, will give breathing treatment.  Plan to obtain UA, bladder scan, check basic labs and likely insert Foley if patient is retaining urine.  2:53 PM Creatinine today is 2.25 however patient tolerates p.o. and currently able to drink plenty of fluid and able to urinate without difficulty.  White count is normal.  If UA is normal, patient may follow-up outpatient for evaluation of condition.  She may benefit from urology follow-up for urodynamic study.  3:16 PM UA shows moderate Hgb on urine dipstick but no evidence of infection.  Encourage outpt f/u  with urology for further evaluation of her sxs. Albuterol inhaler given for mild copd exacerbation.    Domenic Moras, PA-C 05/18/19 1517    Lucrezia Starch, MD 05/18/19 [phone]

## 2019-05-18 NOTE — ED Notes (Signed)
Pt bladder scanned, 97 ml noted via bladder scan.

## 2019-05-18 NOTE — Telephone Encounter (Signed)
Received call from patient husband, Nadara Mustard.   Reports that patient is having difficulty urinating. States that patient reports that she has not released any urine in >24hrs. States that there is some lower abd discomfort.   Advised to have patient return to ER for evaluation. Advised urinary retention could be caused by any number of issues and she would need close evaluation to determine cause and best course of treatment.   Of note, patient did leave ER AMA on 05/16/2019 for chest pain. Reports that pain has improved.

## 2019-05-18 NOTE — ED Notes (Signed)
Pt discharge instructions reviewed. Pt verbalized understanding of all instructions. Pt discharged. 

## 2019-05-18 NOTE — ED Triage Notes (Signed)
Pt reports an inability to produce urine. Pt reports this just started this morning. Pt reports this happened 1 time before and they had to catheterize her.

## 2019-05-19 NOTE — Telephone Encounter (Signed)
Call placed to patient.   Reports that she is feeling pretty good today. Reports that she has voided without issues today.   Reports that BP has been averaging around 130/70.  MD aware.

## 2019-05-19 NOTE — Telephone Encounter (Signed)
Call pt I reviewed ER note Her Cr is back up to 2.2, it was  1.76 We have already referred her to urology She left from the ER for the chest pain, what is her blood pressure running?

## 2019-05-23 ENCOUNTER — Other Ambulatory Visit: Payer: Self-pay | Admitting: *Deleted

## 2019-05-23 ENCOUNTER — Other Ambulatory Visit: Payer: Self-pay | Admitting: Family Medicine

## 2019-05-23 MED ORDER — ZOLPIDEM TARTRATE ER 6.25 MG PO TBCR: 6.2500 mg | EXTENDED_RELEASE_TABLET | Freq: Every evening | ORAL | 1 refills | Status: DC | PRN

## 2019-05-23 NOTE — Telephone Encounter (Signed)
Received call from patient and spouse.   Reports that she was given Ambien in hospital to help her rest.   Requested refill on medication.   Ok to refill??  Last office visit 05/16/2019.  Last refill 05/02/2019, #15 tabs.

## 2019-05-24 NOTE — Telephone Encounter (Signed)
Ok to refill 

## 2019-05-25 NOTE — Progress Notes (Deleted)
[facility] [address] [place]. [place] [address] [phone]       OFFICE FOLLOW UP NOTE  [name] Date of Birth:  04/28/1961 Medical Record Number:  [id number]   Reason for visit: Stroke follow up  No chief complaint on file.    HPI:   Virtual visit 11/15/2019 Kelly Gibson is a 58 y.o. female with underlying medical history of HTN, HLD and DM and has been followed in this office regarding left-sided lacunar infarcts secondary to small vessel disease in 11/2017 with residual deficits of spastic right hemiplegia and dysarthria.Marland Kitchen  She was initially scheduled for follow-up face-to-face in office visit but due to COVID-19 safety precautions, visit transitioned to telemedicine via WebEx.  She was last evaluated in this office on 05/11/2018 with continued spastic right hemiparesis and dysarthria.  She continues to have right spastic hemiparesis without reported improvement but denies worsening but does endorse improvement of her dysarthria and cognition.  She is no longer participating in therapy and "occasionally" do exercises on her own at home.  She does endorse waking up frequently in the middle the night with right arm and leg pain with a tightness sensation in this will make it difficult for her to fall back asleep.  She does endorse doing passive range of motion exercises during the day.  She was previously following with Dr. Letta Pate for Botox injections but has not returned since 01/2018.  She has not trialed muscle relaxants in the past.  She continues to live at home with her husband who assists with majority of ADLs and IADLs.  She is able to ambulate short distance with rolling walker and uses wheelchair for long distance.  Denies any recent falls. She continues on aspirin without side effects of bleeding or bruising.  Continues on rosuvastatin without side effects of myalgias.  Blood pressure ranging 110s/70s.  Glucose levels stable.  No further  concerns at this time.  Denies new or worsening stroke/TIA symptoms.  Update 05/25/2019: Kelly Gibson is a 58 year old female who is being seen today for stroke follow-up.  Residual deficits of right spastic hemiparesis and dysarthria.  At prior visit, initiated tizanidine due to right-sided spasticity and pain.  Continues on aspirin and Crestor for secondary stroke prevention without side effects.  Blood pressure ***.  Glucose levels ***.  Recent lab work showed LDL 78 and A1c 7.0.  Continues to follow with PCP for HTN, HLD and DM management.  Denies new or worsening stroke/TIA symptoms.   ROS:   14 system review of systems performed and negative with exception of muscle pain, weakness, and speech difficulty  PMH:  Past Medical History:  Diagnosis Date   Bradycardia    SR/SB with up to 3.5 second pause (asymptomatic) on ~ 10/2017 event monitor; saw EP Dr. Rayann Heman: avoid AV nodal blocking agents   COPD (chronic obstructive pulmonary disease) ([place])    Depression    Diabetes mellitus    Hyperlipidemia    Hypertension    PONV (postoperative nausea and vomiting)    Stroke (HCC)     PSH:  Past Surgical History:  Procedure Laterality Date   CESAREAN SECTION     x 3   ORIF WRIST FRACTURE Right 05/04/2018   Procedure: OPEN REDUCTION INTERNAL FIXATION (ORIF) RIGHT  WRIST FRACTURE;  Surgeon: Hiram Gash, MD;  Location: [place];  Service: Orthopedics;  Laterality: Right;   TUMOR REMOVAL     left shoulder    Social History:  Social  History   Socioeconomic History   Marital status: Married    Spouse name: Not on file   Number of children: Not on file   Years of education: Not on file   Highest education level: Not on file  Occupational History   Not on file  Social Needs   Financial resource strain: Not on file   Food insecurity    Worry: Not on file    Inability: Not on file   Transportation needs    Medical: Not on file    Non-medical: Not on file  Tobacco Use     Smoking status: Former Smoker    Packs/day: 1.00    Years: 20.00    Pack years: 20.00   Smokeless tobacco: Never Used   Tobacco comment: stopped 2018  Substance and Sexual Activity   Alcohol use: No   Drug use: No   Sexual activity: Not on file  Lifestyle   Physical activity    Days per week: Not on file    Minutes per session: Not on file   Stress: Not on file  Relationships   Social connections    Talks on phone: Not on file    Gets together: Not on file    Attends religious service: Not on file    Active member of club or organization: Not on file    Attends meetings of clubs or organizations: Not on file    Relationship status: Not on file   Intimate partner violence    Fear of current or ex partner: Not on file    Emotionally abused: Not on file    Physically abused: Not on file    Forced sexual activity: Not on file  Other Topics Concern   Not on file  Social History Narrative   Not on file    Family History:  Family History  Problem Relation Age of Onset   Diabetes Mother    Stroke Mother    Heart disease Father    Cancer Father    Stroke Father     Medications:   Current Outpatient Medications on File Prior to Visit  Medication Sig Dispense Refill   albuterol (VENTOLIN HFA) 108 (90 Base) MCG/ACT inhaler Inhale 2 puffs into the lungs every 6 (six) hours as needed for wheezing or shortness of breath. 1 Inhaler 1   amLODipine (NORVASC) 10 MG tablet Take 1 tablet (10 mg total) by mouth daily. (Patient taking differently: Take 10 mg by mouth at bedtime. ) 90 tablet 1   aspirin EC 325 MG EC tablet Take 1 tablet (325 mg total) by mouth daily. 30 tablet 0   clotrimazole-betamethasone (LOTRISONE) cream Apply 1 application topically 2 (two) times daily. (Patient taking differently: Apply 1 application topically 2 (two) times daily. BEHIND THE EARS) 30 g 0   Glucose Blood (BLOOD GLUCOSE TEST STRIPS) STRP Use as directed to monitor FSBS 2x  daily. Dx: E11.9. 100 each 11   glyBURIDE (DIABETA) 5 MG tablet Take 1 tablet (5 mg total) by mouth 2 (two) times daily with a meal. 60 tablet 3   rosuvastatin (CRESTOR) 40 MG tablet TAKE ONE TABLET BY MOUTH DAILY AT 6PM (Patient taking differently: Take 40 mg by mouth at bedtime. ) 90 tablet 2   tiZANidine (ZANAFLEX) 4 MG tablet TAKE ONE TABLET BY MOUTH EVERY 6 HOURS AS NEEDED FOR MUSCLE SPASMS 30 tablet 1   umeclidinium bromide (INCRUSE ELLIPTA) 62.5 MCG/INH AEPB Inhale 1 puff into the lungs daily. 90 each  2   zolpidem (AMBIEN CR) 6.25 MG CR tablet Take 1 tablet (6.25 mg total) by mouth at bedtime as needed for sleep. 30 tablet 1   No current facility-administered medications on file prior to visit.     Allergies:   Allergies  Allergen Reactions   Niacin And Related Rash     Physical Exam   General: well developed, well nourished, pleasant middle-aged African-American female, seated, in no evident distress   Neurologic Exam Mental Status: Awake and fully alert. mild dysarthria.  Oriented to place and time. Recent and remote memory intact. Attention span, concentration and fund of knowledge appropriate. Mood and affect appropriate.  Cranial Nerves: Extraocular movements full without nystagmus.  Hearing intact to voice.  Right-sided facial palsy. Motor: Right upper extremity plegia with spasticity; minimal shoulder shrug.  Able to lift leg from a seated position but drifts prior to 5 seconds.  No evidence of weakness in left upper or lower extremity Sensory.:  UTA but prior visit decreased sensation right upper and lower extremity.  Subjectively decreased sensation continues but denies worsening Coordination: Rapid alternating movements normal on left upper and lower extremity.  Finger-to-nose and heel-to-shin performed accurately left upper and lower extremities.  Unable to test right upper and lower extremity due to weakness Gait and Station: Able to stand from seated position  with mild difficulty and assistance of her husband.  Ambulates with rolling walker and hemiplegic gait. Reflexes:  UTA      Diagnostic Data (Labs, Imaging, Testing)  CT HEAD WO CONTRAST 12/05/2017 IMPRESSION: No acute finding by CT. Extensive old ischemic changes throughout the brain as outlined above.  MR BRAIN WO CONTRAST 12/05/2017 IMPRESSION: 1. Small acute lacunar infarct along the left frontal horn periventricular white matter. Small subacute lacunar infarct in the left posterior corona radiata. No acute hemorrhage or mass effect. 2. Underlying very advanced chronic small vessel disease redemonstrated. Numerous chronic lacunar infarcts in the deep gray matter nuclei, brainstem, and cerebral white matter including the corpus callosum.  CT ANGIO HEAD W OR WO CONTRAST CT ANGIO NECK W OR WO CONTRAST 12/05/2017 IMPRESSION: 1. Negative for large vessel occlusion. 2. Positive for widespread atherosclerosis in the head, neck, and chest. Abundant soft plaque of the aortic arch and the proximal great vessels. 3. No hemodynamically significant stenosis in the neck, but Moderate intracranial stenoses: - distal Left vertebral artery. - distal 3rd Basilar Artery proximal to the SCA origins. - Right MCA posterior M2 branch origin. 4.  Stable CT appearance of the brain since 1250 hours today. 5. Nonspecific mediastinal lymphadenopathy is partially visible. RECOMMEND routine follow-up Chest CT (IV contrast preferred) further characterize.  ECHOCARDIOGRAM 12/06/2017 Study Conclusions  - Left ventricle: The cavity size was normal. There was moderate   concentric hypertrophy. Systolic function was normal. The   estimated ejection fraction was in the range of 55% to 60%. Wall   motion was normal; there were no regional wall motion   abnormalities. Doppler parameters are consistent with abnormal   left ventricular relaxation (grade 1 diastolic dysfunction). - Aortic valve: There was  no regurgitation. - Mitral valve: There was mild regurgitation. - Right ventricle: The cavity size was normal. Wall thickness was   normal. Systolic function was normal. - Right atrium: The atrium was normal in size. - Atrial septum: There was a patent foramen ovale. - Tricuspid valve: There was no regurgitation. - Pulmonary arteries: Systolic pressure could not be accurately   estimated.  Impressions:  - Positive bubble study.  ASSESSMENT: Kelly Gibson is a 59 y.o. year old female here with lacunar stroke on 12/05/17 secondary to small vessel disease. Vascular risk factors include HTN, HLD and DM.  She has been stable from a stroke standpoint with residual right spastic hemiparesis and mild dysarthria but does experience ongoing right upper and lower extremity pain which awakens her at night.    PLAN: -Continue aspirin 325 mg daily  and Crestor for secondary stroke prevention -F/u with PCP regarding your HLD, HTN and DM management -Initiate tizanidine 2 mg nightly.  Advised patient of potential side effects of muscle relaxers such as drowsiness, dizziness and potential falls.  She verbalized understanding and would like to trial as pain interferes with her sleep.  Denies any pain during the day.  As her husband works full-time throughout the week, they will start Friday night to see how she responds during the day on Saturday while her husband is present.  Advised her after 1 week's time to notify office for likely need of increasing dose if no reported side effects.  Advised her that initial dose is a low dose to ensure she can tolerate -continue to monitor BP and glucose levels at home -Highly encouraged continuation of exercises along with performing passive range of motion on right upper and lower extremity -Maintain strict control of hypertension with blood pressure goal below 130/90, diabetes with hemoglobin A1c goal below 6.5% and cholesterol with LDL cholesterol (bad  cholesterol) goal below 70 mg/dL. I also advised the patient to eat a healthy diet with plenty of whole grains, cereals, fruits and vegetables, exercise regularly and maintain ideal body weight.  Follow up in 6 months or call earlier if needed   Greater than 50% of time during this 30 minute visit was spent on counseling,explanation of diagnosis of lacunar stroke, reviewing risk factor management of HLD, HTN and DM, discussion regarding residual right spastic hemiparesis and ongoing management, planning of further management, discussion with patient and family and coordination of care    Frann Rider, [facility]  [facility] [address] [place], [place] [address] she is doing okay  Phone [phone] Fax [phone]

## 2019-05-29 ENCOUNTER — Ambulatory Visit: Payer: Self-pay | Admitting: Adult Health

## 2019-05-29 ENCOUNTER — Encounter: Payer: Self-pay | Admitting: Adult Health

## 2019-06-25 IMAGING — CT CT HEAD W/O CM
4 series · 17 of 47 positions shown, 19 images · non-contrast
Comparison: CT and MRI 10/26/2017

CLINICAL DATA: Dizziness and difficulty walking beginning this
morning. Normal last night.

EXAM:
CT HEAD WITHOUT CONTRAST
TECHNIQUE: Contiguous axial images were obtained from the base of the skull
through the vertex without intravenous contrast.

[Series 3: head wo · axial · 0.45mm/px · z∈[-89,+46]mm · 7 of 37 slices shown, 9 images]
[im 5/37  brain]
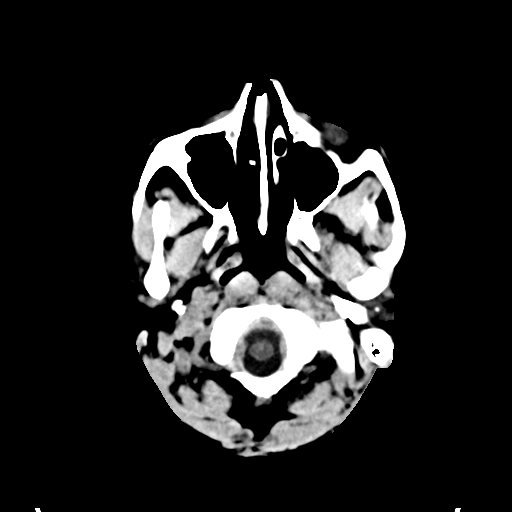
[im 5/37  bone]
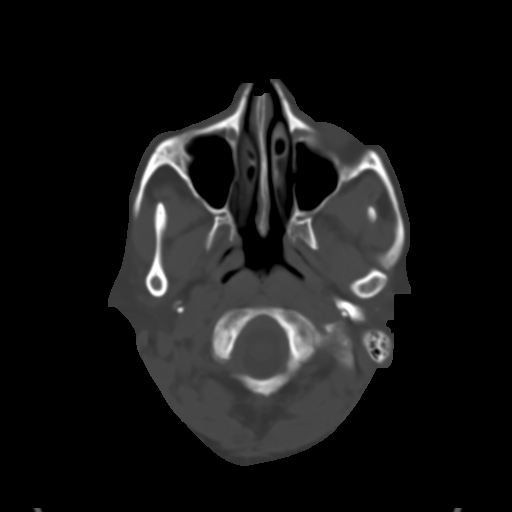
[im 10/37  brain]
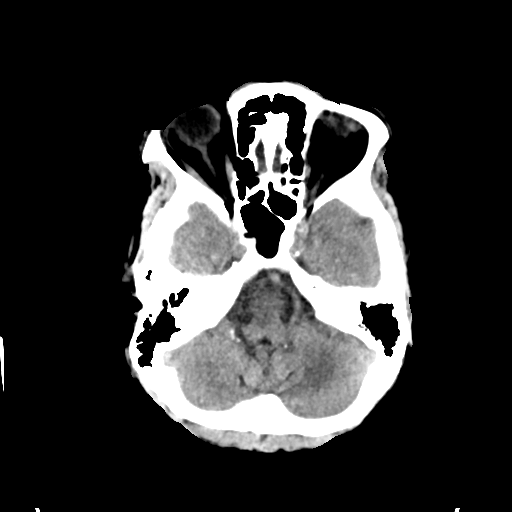
[im 14/37  brain]
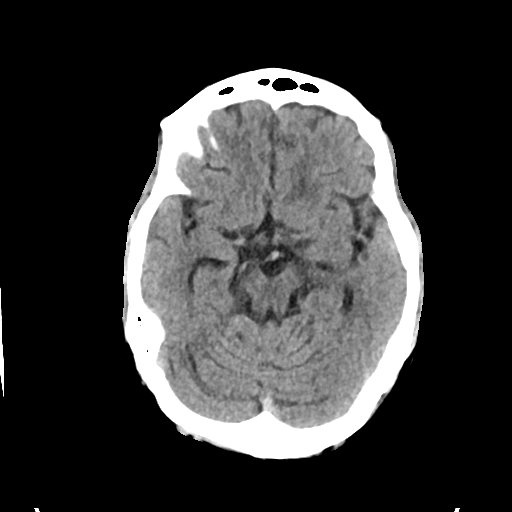
[im 19/37  brain]
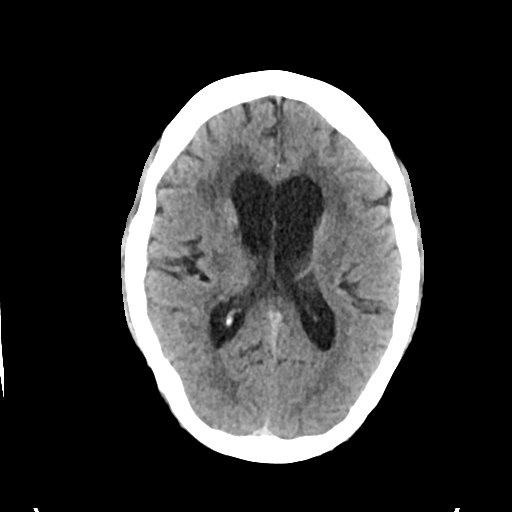
[im 23/37  brain]
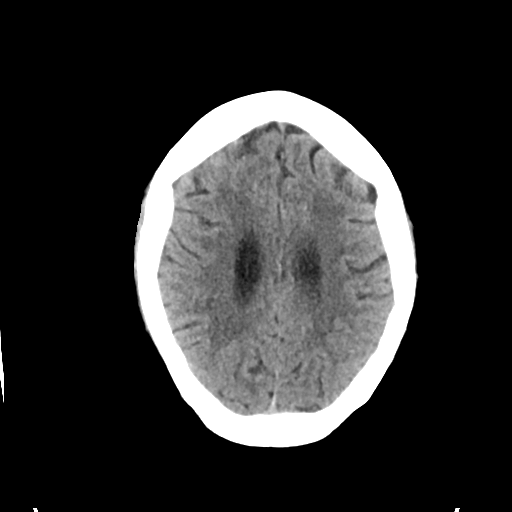
[im 23/37  bone]
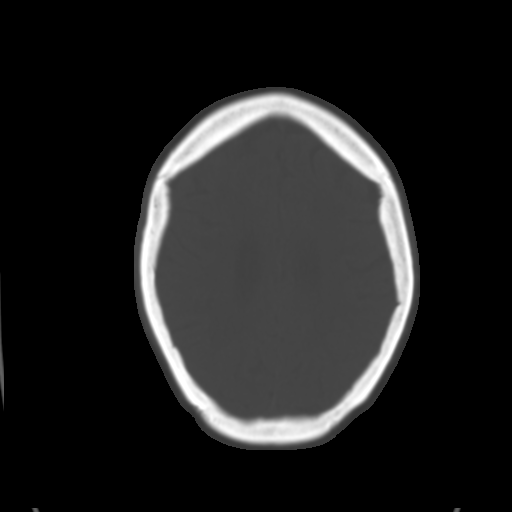
[im 28/37  brain]
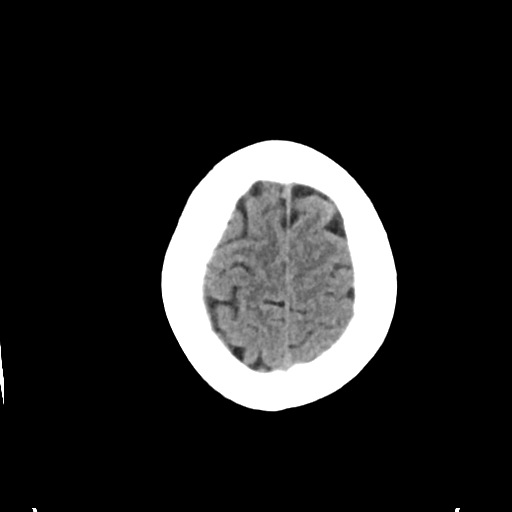
[im 32/37  brain]
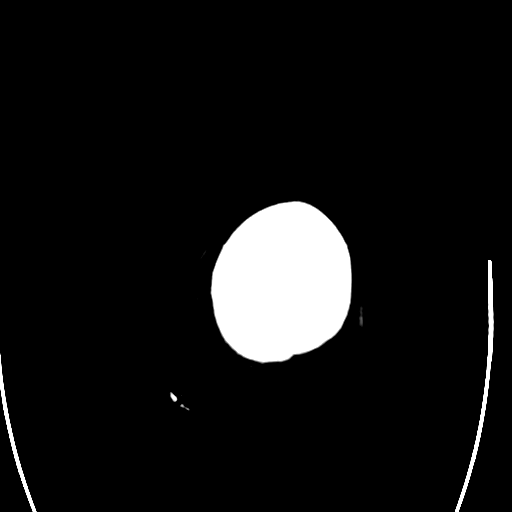

[Series 4: head bone · axial · 0.45mm/px · z∈[-91,-29]mm · 4 of 92 slices shown]
[im 10/92  bone]
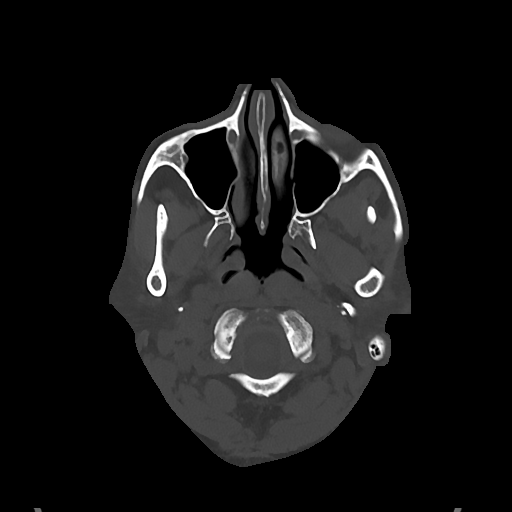
[im 19/92  bone]
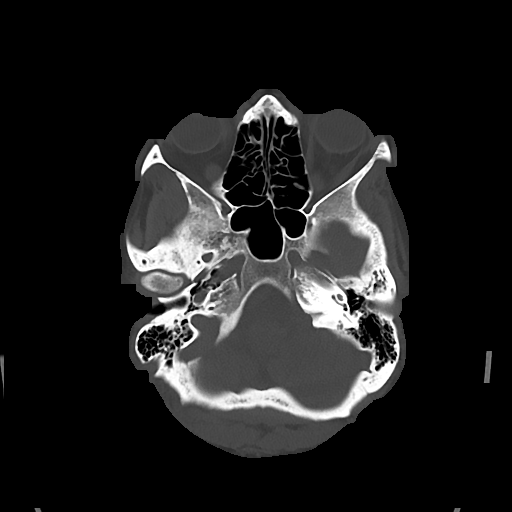
[im 28/92  bone]
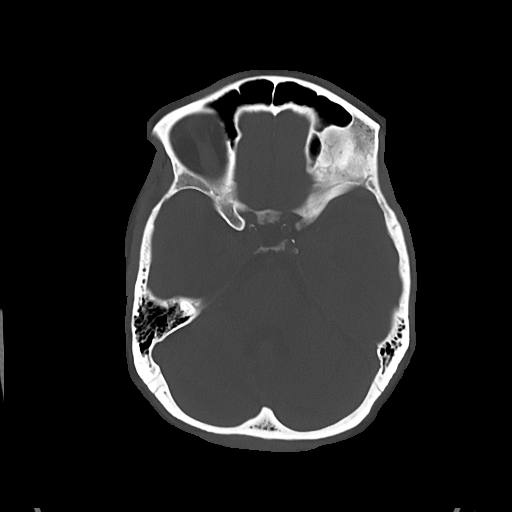
[im 41/92  bone]
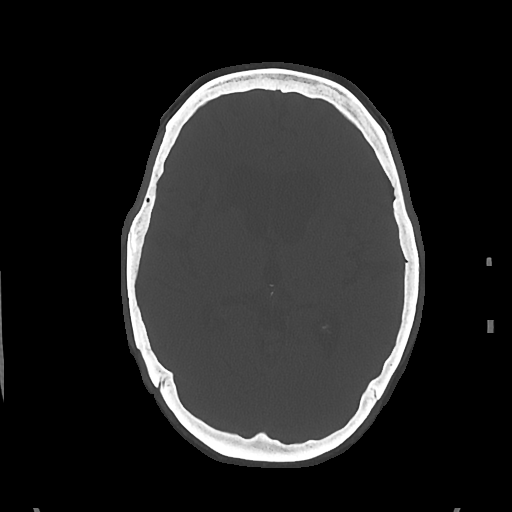

[Series 5: cor soft · coronal · 0.35mm/px · 3 of 71 slices shown]
[im 24/71  brain]
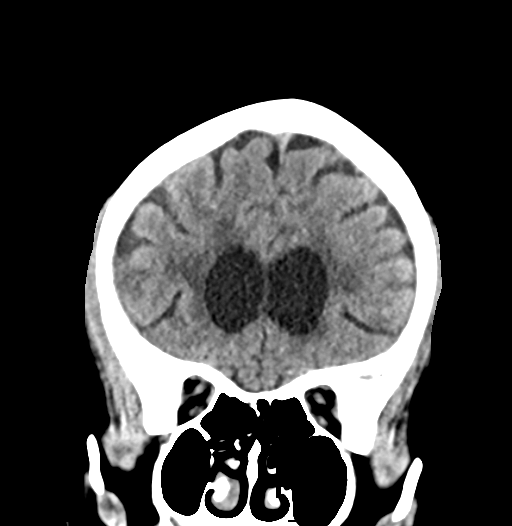
[im 32/71  brain]
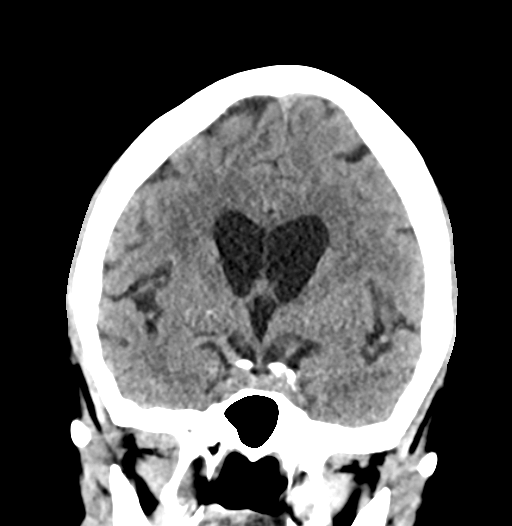
[im 39/71  brain]
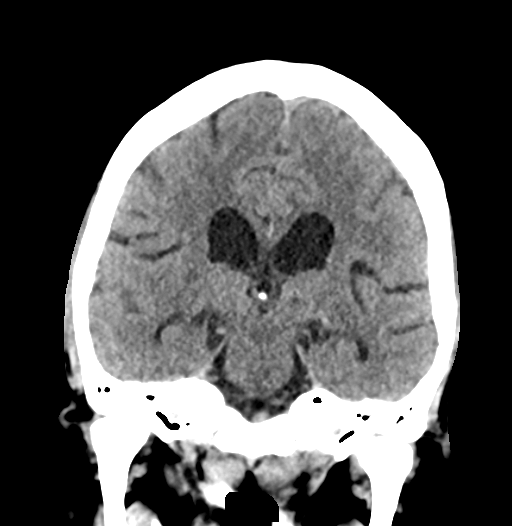

[Series 6: sag soft · sagittal · 0.39mm/px · 3 of 67 slices shown]
[im 23/67  brain]
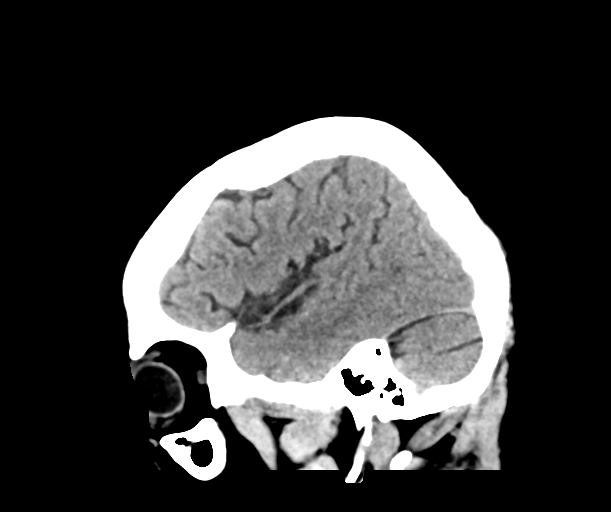
[im 34/67  brain]
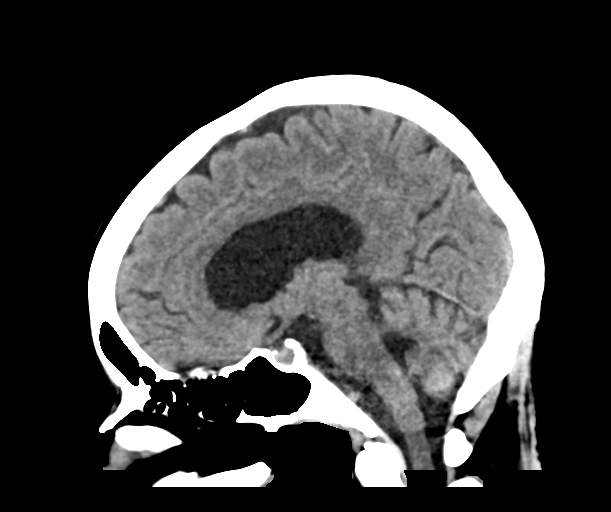
[im 45/67  brain]
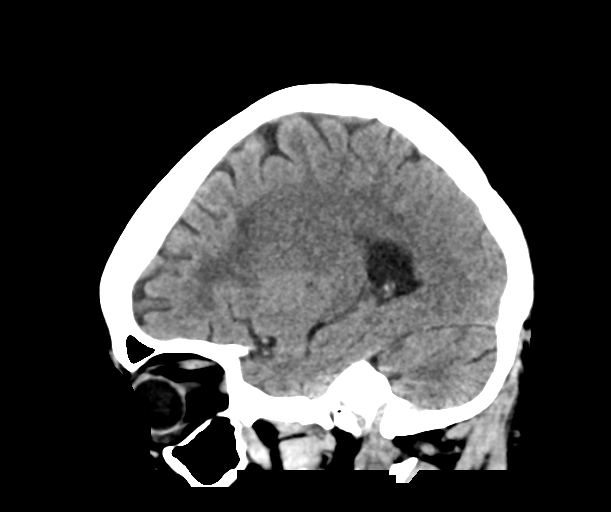

[17 of 47 positions shown; findings below may reference images not displayed]

FINDINGS: Brain: Generalized atrophy. Chronic small-vessel changes affecting
the pons. Old small vessel infarctions affecting the thalami and
basal ganglia. Chronic small-vessel ischemic changes of the white
matter. No sign of acute infarction, mass lesion, hemorrhage,
hydrocephalus or extra-axial collection.

Vascular: There is atherosclerotic calcification of the major
vessels at the base of the brain.

Skull: Negative

Sinuses/Orbits: Clear/normal

Other: None
IMPRESSION: No acute finding by CT. Extensive old ischemic changes throughout
the brain as outlined above.

## 2019-06-26 ENCOUNTER — Ambulatory Visit (INDEPENDENT_AMBULATORY_CARE_PROVIDER_SITE_OTHER): Payer: BC Managed Care – PPO | Admitting: Family Medicine

## 2019-06-26 ENCOUNTER — Encounter: Payer: Self-pay | Admitting: Family Medicine

## 2019-06-26 DIAGNOSIS — E1122 Type 2 diabetes mellitus with diabetic chronic kidney disease: Secondary | ICD-10-CM

## 2019-06-26 DIAGNOSIS — N1832 Chronic kidney disease, stage 3b: Secondary | ICD-10-CM

## 2019-06-26 DIAGNOSIS — J441 Chronic obstructive pulmonary disease with (acute) exacerbation: Secondary | ICD-10-CM | POA: Diagnosis not present

## 2019-06-26 DIAGNOSIS — I10 Essential (primary) hypertension: Secondary | ICD-10-CM | POA: Diagnosis not present

## 2019-06-26 DIAGNOSIS — E1121 Type 2 diabetes mellitus with diabetic nephropathy: Secondary | ICD-10-CM | POA: Diagnosis not present

## 2019-06-26 MED ORDER — INCRUSE ELLIPTA 62.5 MCG/INH IN AEPB: 1.0000 | INHALATION_SPRAY | Freq: Every day | RESPIRATORY_TRACT | 2 refills | Status: DC

## 2019-06-26 MED ORDER — PREDNISONE 20 MG PO TABS: 20.0000 mg | ORAL_TABLET | Freq: Every day | ORAL | 0 refills | Status: DC

## 2019-06-26 MED ORDER — ZOLPIDEM TARTRATE ER 6.25 MG PO TBCR: 6.2500 mg | EXTENDED_RELEASE_TABLET | Freq: Every evening | ORAL | 1 refills | Status: DC | PRN

## 2019-06-26 MED ORDER — ALBUTEROL SULFATE HFA 108 (90 BASE) MCG/ACT IN AERS: 2.0000 | INHALATION_SPRAY | Freq: Four times a day (QID) | RESPIRATORY_TRACT | 2 refills | Status: DC | PRN

## 2019-06-26 MED ORDER — DOXYCYCLINE HYCLATE 100 MG PO TABS: 100.0000 mg | ORAL_TABLET | Freq: Two times a day (BID) | ORAL | 0 refills | Status: DC

## 2019-06-26 NOTE — Patient Instructions (Signed)
F/U1 week for lab visit

## 2019-06-26 NOTE — Progress Notes (Signed)
Virtual Visit via Telephone Note  I connected with Kelly Gibson on 06/26/19 at  8:06am, by telephone and verified that I am speaking with the correct person using two identifiers.       Pt location: at home   Physician location:  In office, [facility], Vic Blackbird MD     On call: patient and physician / patient husband   I discussed the limitations, risks, security and privacy concerns of performing an evaluation and management service by telephone and the availability of in person appointments. I also discussed with the patient that there may be a patient responsible charge related to this service. The patient expressed understanding and agreed to proceed.   History of Present Illness: Patient has been provided most of the information the setting of her speech difficulty since her stroke.  Cough, Congested a lot at night over the past week, some wheezing, no fever  ( Temp at  97.58F)   she took [place] HBP that has helped. She has been using albuterol some. Husband is tested once a week at his job for [employer] and he has been negative. No vomiting, diarrhea, no loss of taste or smell. Schedule the appointment she was much weaker she was not moving around as well her speech seemed worse.  States that she does seem better now.  Concerned that she always states that she is cold but this is intermittently he will give her blankets then she will state that she is hot.    DM- last Glyb 98-105 fasting, no hypoglycemia symptoms  , CBG on the phone was 92      - Has not had kidney check from her last ER visit with acute on chronic renal failure.  He had urinary retention she was referred to urology but they have not made this appointment.  She does feel like she is emptying her bladder much better now.    HTN- BP has been 130's / 80's   Medications  reviewed     Observations/Objective: Telephone visit  Assessment and Plan: COPD exacerbation concern for underlying  viral illness based on her symptoms.  We will start her on doxycycline as she is very high risk for pneumonia has also been in the hospital the past couple months.  Recommend she get Covid testing.  We will give low-dose prednisone 20 mg daily to keep her sugars under control and she can continue using her albuterol inhaler as needed.  Diabetes mellitus no change to her oral medications.  Fasting blood sugars have been at goal.  Expect that her fastings may increase some with the prednisone.  Acute on chronic renal failure she will come in next week for metabolic panel to recheck kidney function.  I provided them with the number for [facility] for her urinary retention   HTN- bp controlled no changes   Cold intolerance they were concerned about anemia she has not had any significant anemia noted on her labs.  Her CBC will be checked again.  We will also check TSH Follow Up Instructions:  lABS IN 1 week, unless COVID positive   I discussed the assessment and treatment plan with the patient. The patient was provided an opportunity to ask questions and all were answered. The patient agreed with the plan and demonstrated an understanding of the instructions.   The patient was advised to call back or seek an in-person evaluation if the symptoms worsen or if the condition fails to improve as anticipated.  I provided  18 minutes of non-face-to-face time during this encounter. End time 8:22Am [employer] [place]  Vic Blackbird, MD

## 2019-06-30 ENCOUNTER — Other Ambulatory Visit: Payer: Self-pay

## 2019-06-30 DIAGNOSIS — Z20822 Contact with and (suspected) exposure to covid-19: Secondary | ICD-10-CM

## 2019-07-01 LAB — NOVEL CORONAVIRUS, NAA: SARS-CoV-2, NAA: NOT DETECTED

## 2019-07-03 ENCOUNTER — Other Ambulatory Visit: Payer: Self-pay | Admitting: Family Medicine

## 2019-07-04 ENCOUNTER — Other Ambulatory Visit: Payer: Self-pay

## 2019-07-04 ENCOUNTER — Other Ambulatory Visit: Payer: BC Managed Care – PPO

## 2019-07-04 DIAGNOSIS — N1832 Chronic kidney disease, stage 3b: Secondary | ICD-10-CM

## 2019-07-04 DIAGNOSIS — I10 Essential (primary) hypertension: Secondary | ICD-10-CM

## 2019-07-05 ENCOUNTER — Other Ambulatory Visit: Payer: Self-pay | Admitting: Family Medicine

## 2019-07-05 LAB — BASIC METABOLIC PANEL
BUN/Creatinine Ratio: 17 (calc) (ref 6–22)
BUN: 22 mg/dL (ref 7–25)
CO2: 25 mmol/L (ref 20–32)
Calcium: 9.3 mg/dL (ref 8.6–10.4)
Chloride: 107 mmol/L (ref 98–110)
Creat: 1.3 mg/dL — ABNORMAL HIGH (ref 0.50–1.05)
Glucose, Bld: 87 mg/dL (ref 65–99)
Potassium: 3.9 mmol/L (ref 3.5–5.3)
Sodium: 143 mmol/L (ref 135–146)

## 2019-07-05 LAB — CBC WITH DIFFERENTIAL/PLATELET
Absolute Monocytes: 564 cells/uL (ref 200–950)
Basophils Absolute: 60 cells/uL (ref 0–200)
Basophils Relative: 1 %
Eosinophils Absolute: 252 cells/uL (ref 15–500)
Eosinophils Relative: 4.2 %
HCT: 36.1 % (ref 35.0–45.0)
Hemoglobin: 11.8 g/dL (ref 11.7–15.5)
Lymphs Abs: 1764 cells/uL (ref 850–3900)
MCH: 28.4 pg (ref 27.0–33.0)
MCHC: 32.7 g/dL (ref 32.0–36.0)
MCV: 86.8 fL (ref 80.0–100.0)
MPV: 10.5 fL (ref 7.5–12.5)
Monocytes Relative: 9.4 %
Neutro Abs: 3360 cells/uL (ref 1500–7800)
Neutrophils Relative %: 56 %
Platelets: 327 10*3/uL (ref 140–400)
RBC: 4.16 10*6/uL (ref 3.80–5.10)
RDW: 13.9 % (ref 11.0–15.0)
Total Lymphocyte: 29.4 %
WBC: 6 10*3/uL (ref 3.8–10.8)

## 2019-07-05 LAB — HEMOGLOBIN A1C
Hgb A1c MFr Bld: 6.6 % of total Hgb — ABNORMAL HIGH (ref <5.7)
Mean Plasma Glucose: 143 (calc)
eAG (mmol/L): 7.9 (calc)

## 2019-07-05 LAB — LIPID PANEL
Cholesterol: 140 mg/dL (ref <200)
HDL: 50 mg/dL (ref >=50)
LDL Cholesterol (Calc): 72 mg/dL (calc)
Non-HDL Cholesterol (Calc): 90 mg/dL (calc) (ref <130)
Total CHOL/HDL Ratio: 2.8 (calc) (ref <5.0)
Triglycerides: 94 mg/dL (ref <150)

## 2019-07-05 LAB — TSH: TSH: 2.28 mIU/L (ref 0.40–4.50)

## 2019-07-06 MED ORDER — GLYBURIDE 5 MG PO TABS: 5.0000 mg | ORAL_TABLET | Freq: Two times a day (BID) | ORAL | 3 refills | Status: DC

## 2019-07-07 ENCOUNTER — Other Ambulatory Visit: Payer: Self-pay | Admitting: *Deleted

## 2019-07-07 ENCOUNTER — Encounter: Payer: Self-pay | Admitting: Family Medicine

## 2019-07-07 MED ORDER — ALBUTEROL SULFATE HFA 108 (90 BASE) MCG/ACT IN AERS: 2.0000 | INHALATION_SPRAY | Freq: Four times a day (QID) | RESPIRATORY_TRACT | 2 refills | Status: DC | PRN

## 2019-07-11 ENCOUNTER — Encounter: Payer: Self-pay | Admitting: Family Medicine

## 2019-07-11 DIAGNOSIS — S43001A Unspecified subluxation of right shoulder joint, initial encounter: Secondary | ICD-10-CM

## 2019-07-11 DIAGNOSIS — M19011 Primary osteoarthritis, right shoulder: Secondary | ICD-10-CM

## 2019-07-12 NOTE — Telephone Encounter (Signed)
Call placed to patient spouse, Nadara Mustard.   Reports that he was told by patient the PT told her her arm was not in the right position. Unable to determine if shoulder is dislocated. No acute pain or trauma noted.   Was given information on [place], [place] [phone]~ telephone. Call placed to [facility]. [place].

## 2019-07-12 NOTE — Telephone Encounter (Signed)
Call placed to [facility], [place]. Reports that patient was being seen by [facility] PT, but she has used all her available visits. Since then, Lovena Le has been coming to home without billing insurance to help patient with mobility. States that she was trying to encourage patient and spouse to get her up out of the bed more during the week when she is not able to come to home, and was explaining importance of mobility.   States that she did describe R shoulder as subluxed. Denies complete dislocation of shoulder. States that she does not feel that it is new, and she does not feel that it will worsen any further than it is now. Advised patient and spouse to use sling to remove some of the pull of gravity from shoulder.   MD to be made aware.

## 2019-07-26 ENCOUNTER — Ambulatory Visit: Payer: BC Managed Care – PPO | Admitting: Orthopaedic Surgery

## 2019-08-02 ENCOUNTER — Encounter: Payer: BC Managed Care – PPO | Admitting: Family Medicine

## 2019-08-03 ENCOUNTER — Other Ambulatory Visit: Payer: Self-pay

## 2019-08-03 ENCOUNTER — Encounter: Payer: Self-pay | Admitting: Orthopaedic Surgery

## 2019-08-03 ENCOUNTER — Ambulatory Visit (INDEPENDENT_AMBULATORY_CARE_PROVIDER_SITE_OTHER): Payer: BC Managed Care – PPO | Admitting: Orthopaedic Surgery

## 2019-08-03 ENCOUNTER — Ambulatory Visit (INDEPENDENT_AMBULATORY_CARE_PROVIDER_SITE_OTHER): Payer: Self-pay

## 2019-08-03 DIAGNOSIS — G8929 Other chronic pain: Secondary | ICD-10-CM

## 2019-08-03 DIAGNOSIS — M25511 Pain in right shoulder: Secondary | ICD-10-CM

## 2019-08-03 NOTE — Progress Notes (Signed)
The patient is a 59 year old female who is sent this way to evaluate and treat her right shoulder.  Unfortunately she had a stroke 2 years ago and has basically flaccid paralysis of her right upper extremity.  Her right shoulder is been very stiff as well as her elbow wrist and hand.  She is basically unable to use this.  Her therapist have been working on passive motion of her joints.  They felt that her right shoulder was subluxed her out of place.  She is sent this way for evaluation treatment of this.  She also has significant weakness of her right lower extremity.  I talk with her caregiver in length.  This is a chronic situation and there is not a surgical intervention to this clinic help for this given the weight of her shoulder and upper extremity that leads to muscle weakening and this chronic positioning of the shoulder.  On exam clinically the shoulder is located and I can put her through motion but is certainly stiff to do so.  She has no function of that upper extremity in terms of any motor function.  The wrist in a flexed position with stiffness of her fingers.  I can gently flex extend the elbow but is very stiff.  I do feel the shoulder is likely slightly subluxed inferior due to the weight of her arm.  X-rays are also seen today and show no acute injury to the shoulder on the right side.  I do feel that the humeral head is in a more inferior position as it relates to the glenoid due to her chronic condition.  I am at a loss of anything else that we can try for the situation.  There is not a surgical intervention that will help.  She can certainly continue therapy to work on a type of stretching or strengthening but given the fact that she cannot do anything on her own with the shoulder she will not likely have a recovery and this will slowly worsen in terms of that position.  All question concerns were answered and addressed.  Follow-up will be as needed.

## 2019-08-29 ENCOUNTER — Telehealth: Payer: Self-pay | Admitting: *Deleted

## 2019-08-29 NOTE — Telephone Encounter (Signed)
Received VM from patient spouse Nadara Mustard. Reports that he is following up on a request for a referral to [place].   No referral noted.   Call placed to patient spouse to inquire.  [place].

## 2019-08-30 NOTE — Telephone Encounter (Signed)
Call placed to patient spouse Nadara Mustard. [place].

## 2019-09-04 NOTE — Telephone Encounter (Signed)
Multiple calls placed to patient spouse with no answer and no return call.   Message to be closed.  

## 2019-09-06 ENCOUNTER — Encounter: Payer: Self-pay | Admitting: Family Medicine

## 2019-10-04 ENCOUNTER — Other Ambulatory Visit: Payer: Self-pay | Admitting: Family Medicine

## 2019-10-05 NOTE — Telephone Encounter (Signed)
Ok to refill??  Last office visit/ refill 06/26/2019.

## 2019-10-31 ENCOUNTER — Encounter: Payer: BC Managed Care – PPO | Admitting: Family Medicine

## 2019-11-16 ENCOUNTER — Other Ambulatory Visit: Payer: Self-pay | Admitting: Family Medicine

## 2019-12-18 ENCOUNTER — Other Ambulatory Visit: Payer: Self-pay

## 2019-12-18 ENCOUNTER — Ambulatory Visit (INDEPENDENT_AMBULATORY_CARE_PROVIDER_SITE_OTHER): Payer: Self-pay | Admitting: Family Medicine

## 2019-12-18 ENCOUNTER — Encounter: Payer: Self-pay | Admitting: Family Medicine

## 2019-12-18 VITALS — BP 138/78 | HR 80 | Temp 98.1°F | Resp 16

## 2019-12-18 DIAGNOSIS — E1122 Type 2 diabetes mellitus with diabetic chronic kidney disease: Secondary | ICD-10-CM

## 2019-12-18 DIAGNOSIS — I69351 Hemiplegia and hemiparesis following cerebral infarction affecting right dominant side: Secondary | ICD-10-CM

## 2019-12-18 DIAGNOSIS — J410 Simple chronic bronchitis: Secondary | ICD-10-CM

## 2019-12-18 DIAGNOSIS — D649 Anemia, unspecified: Secondary | ICD-10-CM

## 2019-12-18 DIAGNOSIS — R202 Paresthesia of skin: Secondary | ICD-10-CM

## 2019-12-18 DIAGNOSIS — R609 Edema, unspecified: Secondary | ICD-10-CM

## 2019-12-18 DIAGNOSIS — I10 Essential (primary) hypertension: Secondary | ICD-10-CM

## 2019-12-18 DIAGNOSIS — R200 Anesthesia of skin: Secondary | ICD-10-CM

## 2019-12-18 DIAGNOSIS — E1121 Type 2 diabetes mellitus with diabetic nephropathy: Secondary | ICD-10-CM

## 2019-12-18 DIAGNOSIS — N1832 Chronic kidney disease, stage 3b: Secondary | ICD-10-CM

## 2019-12-18 MED ORDER — FUROSEMIDE 20 MG PO TABS: 20.0000 mg | ORAL_TABLET | Freq: Every day | ORAL | 0 refills | Status: DC | PRN

## 2019-12-18 NOTE — Assessment & Plan Note (Addendum)
Blood pressure is controlled.  I am concerned about her bilateral edema.  Differentials include some vascular disease in the setting of her other comorbidities and she has leaky vessels leading to some pooling.  She is not very mobile which increases her risk of edema.  Other possibility is a side effect of amlodipine versus heart failure.  She did have echo done in 2018 when she had her stroke which showed preserved ejection fraction and diastolic dysfunction stage I.  We will check her renal function metabolic panel. I will  start her on Lasix 20 mg once a day for a week and see if this reduces her fluid before discontinuing the amlodipine. If still no improvement will get repeat echo   For the neuropathic leg symptoms this is only been going on for about a week.  I will check her iron and B12.  Her husband states that she can think she is cold all the time she has had some anemia.  We will also check her A1c in the setting of her diabetes mellitus.  We will hold on starting medication such as gabapentin as his symptoms are not very severe.  Other possibility is that she has a tingling numb sensation because of the swelling in her lower extremities.  COPD no recent exacerbations continue [place].

## 2019-12-18 NOTE — Progress Notes (Signed)
Subjective:    Patient ID: Kelly Gibson, female    DOB: [birth year], 59 y.o.   MRN: [id number]  Patient presents for L Foot Numbness (has numbness to toes on L foot)   COPD- No recent exacerbations, taking incruse and albuterol prn   HTN -taking bp medsa as prescribed    DM- last A1C 6.6% in December, CBG range,  Taking glyuride 5mg  BID, no meter but on average fasting 90-120's    CKD- last Creatinine improved to 1.3, she had acute on chronic renal failure in Nov with Creatinine up to 2.25  Hyperlipidemia- LDL 72 in December , taking Crestor 40mg  once a day   History of CVA- with spastic hemiplegia of right side   Past week she has had increased swelling in both lower extremities along with tingling and numbness in her feet.  She is not had any falls or injuries recently.  She does admit that she has more swelling she eats more salt.  She has been trying to be active up on her feet and she does get more swelling those days.  When she elevates her feet especially at bedtime the swelling does go down but has not completely resolved.  She denies any chest pain or shortness of breath associated   Review Of Systems:  GEN- denies fatigue, fever, weight loss,weakness, recent illness HEENT- denies eye drainage, change in vision, nasal discharge, CVS- denies chest pain, palpitations RESP- denies SOB, cough, wheeze ABD- denies N/V, change in stools, abd pain GU- denies dysuria, hematuria, dribbling, incontinence MSK- + joint pain, muscle aches, injury Neuro- denies headache, dizziness, syncope, seizure activity       Objective:    BP 138/78   Pulse 80   Temp 98.1 F (36.7 C) (Temporal)   Resp 16   LMP 12/10/2012 (Exact Date)   SpO2 95%  GEN- NAD, alert and oriented x3, in wheelchair  HEENT- PERRL, EOMI, non injected sclera, pink conjunctiva, MMM, oropharynx clear Neck- Supple, no thyromegaly CVS- RRR, no murmur RESP-CTAB ABD-NABS,soft,NT,ND Neuro- hemiparesis right side,  dysarthria, decreased monofilament toes Left foot  EXT- non pitting edema 2-3 inches above ankles bilat  R >l  Pulses- Radial  2+     Assessment & Plan:      Problem List Items Addressed This Visit      Unprioritized   Chronic kidney disease (CKD), stage III (moderate)   COPD (chronic obstructive pulmonary disease) (HCC)   Diabetes mellitus (HCC)   Relevant Orders   Hemoglobin A1c   Essential hypertension, benign - Primary    Blood pressure is controlled.  I am concerned about her bilateral edema.  Differentials include some vascular disease in the setting of her other comorbidities and she has leaky vessels leading to some pooling.  She is not very mobile which increases her risk of edema.  Other possibility is a side effect of amlodipine versus heart failure.  She did have echo done in 2018 when she had her stroke which showed preserved ejection fraction and diastolic dysfunction stage I.  We will check her renal function metabolic panel. I will  start her on Lasix 20 mg once a day for a week and see if this reduces her fluid before discontinuing the amlodipine. If still no improvement will get repeat echo   For the neuropathic leg symptoms this is only been going on for about a week.  I will check her iron and B12.  Her husband states that she can think she  is cold all the time she has had some anemia.  We will also check her A1c in the setting of her diabetes mellitus.  We will hold on starting medication such as gabapentin as his symptoms are not very severe.  Other possibility is that she has a tingling numb sensation because of the swelling in her lower extremities.  COPD no recent exacerbations continue Incruse.      Relevant Medications   furosemide (LASIX) 20 MG tablet   Other Relevant Orders   CBC with Differential/Platelet   COMPLETE METABOLIC PANEL WITH GFR   Hemiparesis affecting right side as late effect of cerebrovascular accident (CVA) ([place])    Other Visit  Diagnoses    Peripheral edema       Numbness and tingling of both feet       Relevant Orders   Vitamin B12   Anemia, unspecified type       Relevant Orders   Iron, TIBC and Ferritin Panel   Vitamin B12      Note: This dictation was prepared with Dragon dictation along with smaller phrase technology. Any transcriptional errors that result from this process are unintentional.

## 2019-12-18 NOTE — Assessment & Plan Note (Signed)
>>  ASSESSMENT AND PLAN FOR ESSENTIAL HYPERTENSION, BENIGN WRITTEN ON 12/18/2019  5:31 PM BY [place], KAWANTA F  Blood pressure is controlled.  I am concerned about her bilateral edema.  Differentials include some vascular disease in the setting of her other comorbidities and she has leaky vessels leading to some pooling.  She is not very mobile which increases her risk of edema.  Other possibility is a side effect of amlodipine versus heart failure.  She did have echo done in 2018 when she had her stroke which showed preserved ejection fraction and diastolic dysfunction stage I.  We will check her renal function metabolic panel. I will  start her on Lasix 20 mg once a day for a week and see if this reduces her fluid before discontinuing the amlodipine. If still no improvement will get repeat echo   For the neuropathic leg symptoms this is only been going on for about a week.  I will check her iron and B12.  Her husband states that she can think she is cold all the time she has had some anemia.  We will also check her A1c in the setting of her diabetes mellitus.  We will hold on starting medication such as gabapentin as his symptoms are not very severe.  Other possibility is that she has a tingling numb sensation because of the swelling in her lower extremities.  COPD no recent exacerbations continue Incruse.

## 2019-12-18 NOTE — Patient Instructions (Addendum)
Reschedule wellness physical  We will call with lab results Start lasix 20mg  once a day  Elevate feet

## 2019-12-19 ENCOUNTER — Other Ambulatory Visit: Payer: Self-pay | Admitting: *Deleted

## 2019-12-19 LAB — COMPLETE METABOLIC PANEL WITH GFR
AG Ratio: 1.2 (calc) (ref 1.0–2.5)
ALT: 7 U/L (ref 6–29)
AST: 16 U/L (ref 10–35)
Albumin: 4.2 g/dL (ref 3.6–5.1)
Alkaline phosphatase (APISO): 80 U/L (ref 37–153)
BUN/Creatinine Ratio: 15 (calc) (ref 6–22)
BUN: 24 mg/dL (ref 7–25)
CO2: 22 mmol/L (ref 20–32)
Calcium: 9.7 mg/dL (ref 8.6–10.4)
Chloride: 108 mmol/L (ref 98–110)
Creat: 1.61 mg/dL — ABNORMAL HIGH (ref 0.50–1.05)
GFR, Est African American: 41 mL/min/{1.73_m2} — ABNORMAL LOW (ref >=60)
GFR, Est Non African American: 35 mL/min/{1.73_m2} — ABNORMAL LOW (ref >=60)
Globulin: 3.4 g/dL (calc) (ref 1.9–3.7)
Glucose, Bld: 75 mg/dL (ref 65–99)
Potassium: 3.6 mmol/L (ref 3.5–5.3)
Sodium: 141 mmol/L (ref 135–146)
Total Bilirubin: 0.4 mg/dL (ref 0.2–1.2)
Total Protein: 7.6 g/dL (ref 6.1–8.1)

## 2019-12-19 LAB — CBC WITH DIFFERENTIAL/PLATELET
Absolute Monocytes: 657 cells/uL (ref 200–950)
Basophils Absolute: 62 cells/uL (ref 0–200)
Basophils Relative: 1 %
Eosinophils Absolute: 248 cells/uL (ref 15–500)
Eosinophils Relative: 4 %
HCT: 42.6 % (ref 35.0–45.0)
Hemoglobin: 13.8 g/dL (ref 11.7–15.5)
Lymphs Abs: 2815 cells/uL (ref 850–3900)
MCH: 26.4 pg — ABNORMAL LOW (ref 27.0–33.0)
MCHC: 32.4 g/dL (ref 32.0–36.0)
MCV: 81.6 fL (ref 80.0–100.0)
MPV: 11 fL (ref 7.5–12.5)
Monocytes Relative: 10.6 %
Neutro Abs: 2418 cells/uL (ref 1500–7800)
Neutrophils Relative %: 39 %
Platelets: 343 10*3/uL (ref 140–400)
RBC: 5.22 10*6/uL — ABNORMAL HIGH (ref 3.80–5.10)
RDW: 14.6 % (ref 11.0–15.0)
Total Lymphocyte: 45.4 %
WBC: 6.2 10*3/uL (ref 3.8–10.8)

## 2019-12-19 LAB — IRON,TIBC AND FERRITIN PANEL
%SAT: 16 % (calc) (ref 16–45)
Ferritin: 86 ng/mL (ref 16–232)
Iron: 50 ug/dL (ref 45–160)
TIBC: 320 mcg/dL (calc) (ref 250–450)

## 2019-12-19 LAB — HEMOGLOBIN A1C
Hgb A1c MFr Bld: 6.9 % of total Hgb — ABNORMAL HIGH (ref <5.7)
Mean Plasma Glucose: 151 (calc)
eAG (mmol/L): 8.4 (calc)

## 2019-12-19 LAB — VITAMIN B12: Vitamin B-12: 510 pg/mL (ref 200–1100)

## 2019-12-19 MED ORDER — GLYBURIDE 5 MG PO TABS: ORAL_TABLET | ORAL | 3 refills | Status: DC

## 2020-01-04 ENCOUNTER — Other Ambulatory Visit: Payer: Self-pay | Admitting: Family Medicine

## 2020-01-08 NOTE — Telephone Encounter (Signed)
Ok to refill 

## 2020-01-16 ENCOUNTER — Other Ambulatory Visit: Payer: Self-pay | Admitting: Family Medicine

## 2020-01-30 ENCOUNTER — Encounter: Payer: Self-pay | Admitting: Family Medicine

## 2020-01-31 MED ORDER — ZOLPIDEM TARTRATE ER 6.25 MG PO TBCR: EXTENDED_RELEASE_TABLET | ORAL | 3 refills | Status: DC

## 2020-01-31 NOTE — Telephone Encounter (Signed)
Ok to refill??  Last office visit 12/18/2019.  Last refill 10/06/2019, #3 refills.

## 2020-03-05 ENCOUNTER — Other Ambulatory Visit: Payer: Self-pay | Admitting: Family Medicine

## 2020-04-09 ENCOUNTER — Other Ambulatory Visit: Payer: Self-pay | Admitting: Family Medicine

## 2020-04-09 NOTE — Telephone Encounter (Signed)
Requested Prescriptions   Pending Prescriptions Disp Refills  . tiZANidine (ZANAFLEX) 4 MG tablet [Pharmacy Med Name: tiZANidine HCL 4MG  TABLET] 30 tablet 2    Sig: TAKE ONE TABLET BY MOUTH EVERY 6 HOURS AS NEEDED FOR MUSCLE SPASMS     Last OV 12/18/2019   Last written 01/08/2020

## 2020-05-06 ENCOUNTER — Other Ambulatory Visit: Payer: Self-pay | Admitting: Family Medicine

## 2020-05-06 MED ORDER — AMLODIPINE BESYLATE 10 MG PO TABS
10.0000 mg | ORAL_TABLET | Freq: Every day | ORAL | 0 refills | Status: DC
Start: 2020-05-06 — End: 2020-05-20

## 2020-05-14 ENCOUNTER — Other Ambulatory Visit: Payer: Self-pay | Admitting: Family Medicine

## 2020-05-20 ENCOUNTER — Other Ambulatory Visit: Payer: Self-pay | Admitting: Family Medicine

## 2020-05-20 ENCOUNTER — Encounter: Payer: Self-pay | Admitting: Family Medicine

## 2020-05-20 MED ORDER — AMLODIPINE BESYLATE 10 MG PO TABS
10.0000 mg | ORAL_TABLET | Freq: Every day | ORAL | 0 refills | Status: DC
Start: 2020-05-20 — End: 2020-07-01

## 2020-05-22 ENCOUNTER — Encounter: Payer: Self-pay | Admitting: Family Medicine

## 2020-05-22 ENCOUNTER — Ambulatory Visit (INDEPENDENT_AMBULATORY_CARE_PROVIDER_SITE_OTHER): Payer: Medicare Other | Admitting: Family Medicine

## 2020-05-22 ENCOUNTER — Other Ambulatory Visit: Payer: Self-pay

## 2020-05-22 VITALS — BP 140/70 | HR 51 | Temp 98.5°F | Wt 163.2 lb

## 2020-05-22 DIAGNOSIS — J410 Simple chronic bronchitis: Secondary | ICD-10-CM | POA: Diagnosis not present

## 2020-05-22 DIAGNOSIS — I10 Essential (primary) hypertension: Secondary | ICD-10-CM | POA: Diagnosis not present

## 2020-05-22 DIAGNOSIS — E1122 Type 2 diabetes mellitus with diabetic chronic kidney disease: Secondary | ICD-10-CM | POA: Diagnosis not present

## 2020-05-22 DIAGNOSIS — Z23 Encounter for immunization: Secondary | ICD-10-CM

## 2020-05-22 DIAGNOSIS — E785 Hyperlipidemia, unspecified: Secondary | ICD-10-CM

## 2020-05-22 DIAGNOSIS — I69351 Hemiplegia and hemiparesis following cerebral infarction affecting right dominant side: Secondary | ICD-10-CM

## 2020-05-22 DIAGNOSIS — Q845 Enlarged and hypertrophic nails: Secondary | ICD-10-CM

## 2020-05-22 DIAGNOSIS — N1832 Chronic kidney disease, stage 3b: Secondary | ICD-10-CM | POA: Diagnosis not present

## 2020-05-22 NOTE — Assessment & Plan Note (Signed)
>>  ASSESSMENT AND PLAN FOR TYPE 2 DIABETES MELLITUS WITH DIABETIC NEUROPATHY, WITH LONG-TERM CURRENT USE OF INSULIN  (HCC) WRITTEN ON 05/22/2020  2:03 PM BY [place], KAWANTA F  Recheck A1C goal  < 7% Continue glyburide

## 2020-05-22 NOTE — Patient Instructions (Addendum)
Referral to Dr. Dianna Limbo office for the arm device Referral to podiatry Flu shot given We will call with lab results You can ask for Tetantus and Shingles vacin

## 2020-05-22 NOTE — Assessment & Plan Note (Signed)
Controlled no changes 

## 2020-05-22 NOTE — Assessment & Plan Note (Signed)
>>  ASSESSMENT AND PLAN FOR ESSENTIAL HYPERTENSION, BENIGN WRITTEN ON 05/22/2020  2:03 PM BY [place], KAWANTA F  Controlled no changes

## 2020-05-22 NOTE — Assessment & Plan Note (Signed)
Recheck A1C goal  < 7% Continue glyburide

## 2020-05-22 NOTE — Assessment & Plan Note (Signed)
Referral back to physical med physician about this adaptive brace [employer]

## 2020-05-22 NOTE — Progress Notes (Signed)
Subjective:    Patient ID: Kelly Gibson, female    DOB: 1961/07/06, 59 y.o.   MRN: [id number]  Patient presents for Follow-up  Pt here to f/u with her husband.  In general she has been doing quite well.  She has history of stroke with significant right hemiparesis.  She is wheelchair-bound and requires assistance with transfers.  They have been taking a supplement called Neuriva last month or 2.  She has noticed that she is able to grasp a little bit more in her right hand.  They contacted a medical equipment facility called [employer]   some type of wearable brace that since EMG signals to help move her hand but they would like to order.  DM- no hypoglycemia  80-120, 5mg  in AM and 2.5mg   In the evening  nails are very long    HTN- blood pressure shas been good   COPD- taking incruse , albuterol, no recent flares, needs flu shot   Meds reviewed   Review Of Systems:  GEN- denies fatigue, fever, weight loss,weakness, recent illness HEENT- denies eye drainage, change in vision, nasal discharge, CVS- denies chest pain, palpitations RESP- denies SOB, cough, wheeze ABD- denies N/V, change in stools, abd pain GU- denies dysuria, hematuria, dribbling, incontinence MSK- denies joint pain, muscle aches, injury Neuro- denies headache, dizziness, syncope, seizure activity       Objective:    BP 140/70 (BP Location: Left Arm, Patient Position: Sitting, Cuff Size: Normal)   Pulse (!) 51   Temp 98.5 F (36.9 C) (Oral)   Wt 163 lb 3.2 oz (74 kg)   LMP 12/10/2012 (Exact Date)   SpO2 98%   BMI 28.91 kg/m  GEN- NAD, alert and oriented x3, in wheelchair  HEENT- PERRL, EOMI, non injected sclera, pink conjunctiva, MMM, oropharynx clear Neck- Supple, no thyromegaly CVS- RRR, no murmur RESP-CTAB ABD-NABS,soft,NT,ND Neuro- hemiparesis right side, dysarthria,  Right hand contracted  decreased monofilament toes Left foot  EXT- trace edema R >L thick long nails  Pulses- Radial   2+        Assessment & Plan:      Problem List Items Addressed This Visit      Unprioritized   Chronic kidney disease (CKD), stage III (moderate) (HCC)   Relevant Orders   Comprehensive metabolic panel   COPD (chronic obstructive pulmonary disease) (HCC)    No recent flares Flu shot given       Diabetes mellitus ([place])    Recheck A1C goal  < 7% Continue glyburide       Relevant Orders   CBC with Differential/Platelet   Comprehensive metabolic panel   Lipid panel   Hemoglobin A1c   Ambulatory referral to Podiatry   Essential hypertension, benign - Primary    Controlled no changes       Relevant Orders   CBC with Differential/Platelet   Comprehensive metabolic panel   Hemiparesis affecting right side as late effect of cerebrovascular accident (CVA) ([place])    Referral back to physical med physician about this adaptive brace My[employer]       Relevant Orders   Ambulatory referral to Physical Medicine Rehab   Hyperlipidemia    Other Visit Diagnoses    Enlarged and hypertrophic nails       Relevant Orders   Ambulatory referral to Podiatry   Need for immunization against influenza       Relevant Orders   Flu Vaccine QUAD 36+ mos IM (Completed)  Note: This dictation was prepared with Dragon dictation along with smaller phrase technology. Any transcriptional errors that result from this process are unintentional.

## 2020-05-22 NOTE — Assessment & Plan Note (Signed)
No recent flares Flu shot given

## 2020-05-23 LAB — COMPREHENSIVE METABOLIC PANEL
AG Ratio: 1.1 (calc) (ref 1.0–2.5)
ALT: 8 U/L (ref 6–29)
AST: 15 U/L (ref 10–35)
Albumin: 4.1 g/dL (ref 3.6–5.1)
Alkaline phosphatase (APISO): 74 U/L (ref 37–153)
BUN/Creatinine Ratio: 19 (calc) (ref 6–22)
BUN: 21 mg/dL (ref 7–25)
CO2: 26 mmol/L (ref 20–32)
Calcium: 9.8 mg/dL (ref 8.6–10.4)
Chloride: 107 mmol/L (ref 98–110)
Creat: 1.12 mg/dL — ABNORMAL HIGH (ref 0.50–1.05)
Globulin: 3.6 g/dL (calc) (ref 1.9–3.7)
Glucose, Bld: 153 mg/dL — ABNORMAL HIGH (ref 65–99)
Potassium: 4.3 mmol/L (ref 3.5–5.3)
Sodium: 143 mmol/L (ref 135–146)
Total Bilirubin: 0.4 mg/dL (ref 0.2–1.2)
Total Protein: 7.7 g/dL (ref 6.1–8.1)

## 2020-05-23 LAB — CBC WITH DIFFERENTIAL/PLATELET
Absolute Monocytes: 562 cells/uL (ref 200–950)
Basophils Absolute: 62 cells/uL (ref 0–200)
Basophils Relative: 1.2 %
Eosinophils Absolute: 260 cells/uL (ref 15–500)
Eosinophils Relative: 5 %
HCT: 41.3 % (ref 35.0–45.0)
Hemoglobin: 13.8 g/dL (ref 11.7–15.5)
Lymphs Abs: 1758 cells/uL (ref 850–3900)
MCH: 28.1 pg (ref 27.0–33.0)
MCHC: 33.4 g/dL (ref 32.0–36.0)
MCV: 84.1 fL (ref 80.0–100.0)
MPV: 10.7 fL (ref 7.5–12.5)
Monocytes Relative: 10.8 %
Neutro Abs: 2558 cells/uL (ref 1500–7800)
Neutrophils Relative %: 49.2 %
Platelets: 320 10*3/uL (ref 140–400)
RBC: 4.91 10*6/uL (ref 3.80–5.10)
RDW: 13.5 % (ref 11.0–15.0)
Total Lymphocyte: 33.8 %
WBC: 5.2 10*3/uL (ref 3.8–10.8)

## 2020-05-23 LAB — LIPID PANEL
Cholesterol: 142 mg/dL (ref <200)
HDL: 63 mg/dL (ref >=50)
LDL Cholesterol (Calc): 63 mg/dL (calc)
Non-HDL Cholesterol (Calc): 79 mg/dL (calc) (ref <130)
Total CHOL/HDL Ratio: 2.3 (calc) (ref <5.0)
Triglycerides: 84 mg/dL (ref <150)

## 2020-05-23 LAB — HEMOGLOBIN A1C
Hgb A1c MFr Bld: 6.5 % of total Hgb — ABNORMAL HIGH (ref <5.7)
Mean Plasma Glucose: 140 (calc)
eAG (mmol/L): 7.7 (calc)

## 2020-05-31 ENCOUNTER — Other Ambulatory Visit: Payer: Self-pay

## 2020-05-31 ENCOUNTER — Encounter: Payer: Medicare Other | Attending: Physical Medicine & Rehabilitation | Admitting: Physical Medicine & Rehabilitation

## 2020-05-31 ENCOUNTER — Encounter: Payer: Self-pay | Admitting: Physical Medicine & Rehabilitation

## 2020-05-31 VITALS — BP 174/73 | HR 58 | Temp 97.5°F | Ht 63.0 in | Wt 170.4 lb

## 2020-05-31 DIAGNOSIS — E1140 Type 2 diabetes mellitus with diabetic neuropathy, unspecified: Secondary | ICD-10-CM | POA: Diagnosis not present

## 2020-05-31 DIAGNOSIS — I69398 Other sequelae of cerebral infarction: Secondary | ICD-10-CM | POA: Insufficient documentation

## 2020-05-31 DIAGNOSIS — Z794 Long term (current) use of insulin: Secondary | ICD-10-CM | POA: Insufficient documentation

## 2020-05-31 DIAGNOSIS — I69351 Hemiplegia and hemiparesis following cerebral infarction affecting right dominant side: Secondary | ICD-10-CM | POA: Diagnosis not present

## 2020-05-31 DIAGNOSIS — R269 Unspecified abnormalities of gait and mobility: Secondary | ICD-10-CM | POA: Insufficient documentation

## 2020-05-31 NOTE — Progress Notes (Signed)
Subjective:    Patient ID: Kelly Gibson, female    DOB: 01/07/1961, 59 y.o.   MRN: [id number] 59 year old right-handed female with a history of diabetes mellitus, COPD, hypertension, CVA in March with mild right-sided residual weakness maintained on aspirin as well as loop recorder.  Lives with spouse.  Independent  driving prior to admission.  Her husband works during the day.  Presented 12/05/2017 with increasing right-sided weakness, slurred speech and dizziness as well as reported weight loss.  Loop recorder did show 3-4 second pauses.  Initial heart rate in the  30s.  MRI of the brain showed small acute lacunar infarction along the left frontal horn periventricular white matter.  Small subacute lacunar infarction in the left posterior corona radiata.  Numerous chronic lacunar infarcts in the deep gray white  matter.  CT angiogram of head and neck negative for large vessel occlusion.  Positive for widespread atherosclerosis.  Echocardiogram with ejection fraction of 60%, no wall motion abnormalities, grade I diastolic dysfunction.  Neurology followup.    01/31/2018 Biceps50 FCR50 FCU25 FDS50 FDP25 FPL25 [place] 25 Gastrosoleus50  HPI   CC:  Right arm stiffness and weakness 59 year old female who was last seen in clinic approximately 2 years ago for right upper extremity spasticity post CVA is here today at the request of her primary care physician. Patient is interested in a myoelectric orthosis. The patient states she did not follow-up for 2 years because of Covid. Patient's husband states that since that time she has regained some minimal ability to open and close her hand. One of her relatives found an ad for myoelectric orthotic and forwarded the information to the patient's husband. He has been in contact with the company and she has had a video evaluation through one of the representatives. According the husband, they felt patient may benefit. Uses hemiwalker using the left  hand. Also has left foot numbness, x 30mo, worsening with time No weakness, no back pain, No falls or injuries No weakness or numbnes in left arm. The patient is a diabetic. In 2019 the patient had a hemoglobin a A1c of 8.4, most recent value was 6.5 on 10/27 Pain Inventory Average Pain 0 Pain Right Now 0 My pain is no pain  In the last 24 hours, has pain interfered with the following? General activity 0 Relation with others 0 Enjoyment of life 0 What TIME of day is your pain at its worst? night Sleep (in general) NA  Pain is worse with: walking Pain improves with: rest Relief from Meds: 8  Family History  Problem Relation Age of Onset  . Diabetes Mother   . Stroke Mother   . Heart disease Father   . Cancer Father   . Stroke Father    Social History   Socioeconomic History  . Marital status: Married    Spouse name: Not on file  . Number of children: Not on file  . Years of education: Not on file  . Highest education level: Not on file  Occupational History  . Not on file  Tobacco Use  . Smoking status: Former Smoker    Packs/day: 1.00    Years: 20.00    Pack years: 20.00  . Smokeless tobacco: Never Used  . Tobacco comment: stopped 2018  Vaping Use  . Vaping Use: Never used  Substance and Sexual Activity  . Alcohol use: No  . Drug use: No  . Sexual activity: Not on file  Other Topics Concern  .  Not on file  Social History Narrative  . Not on file   Social Determinants of Health   Financial Resource Strain:   . Difficulty of Paying Living Expenses: Not on file  Food Insecurity:   . Worried About Charity fundraiser in the Last Year: Not on file  . Ran Out of Food in the Last Year: Not on file  Transportation Needs:   . Lack of Transportation (Medical): Not on file  . Lack of Transportation (Non-Medical): Not on file  Physical Activity:   . Days of Exercise per Week: Not on file  . Minutes of Exercise per Session: Not on file  Stress:   . Feeling of  Stress : Not on file  Social Connections:   . Frequency of Communication with Friends and Family: Not on file  . Frequency of Social Gatherings with Friends and Family: Not on file  . Attends Religious Services: Not on file  . Active Member of Clubs or Organizations: Not on file  . Attends Archivist Meetings: Not on file  . Marital Status: Not on file   Past Surgical History:  Procedure Laterality Date  . CESAREAN SECTION     x 3  . ORIF WRIST FRACTURE Right 05/04/2018   Procedure: OPEN REDUCTION INTERNAL FIXATION (ORIF) RIGHT  WRIST FRACTURE;  Surgeon: Hiram Gash, MD;  Location: [place];  Service: Orthopedics;  Laterality: Right;  . TUMOR REMOVAL     left shoulder   Past Surgical History:  Procedure Laterality Date  . CESAREAN SECTION     x 3  . ORIF WRIST FRACTURE Right 05/04/2018   Procedure: OPEN REDUCTION INTERNAL FIXATION (ORIF) RIGHT  WRIST FRACTURE;  Surgeon: Hiram Gash, MD;  Location: [place];  Service: Orthopedics;  Laterality: Right;  . TUMOR REMOVAL     left shoulder   Past Medical History:  Diagnosis Date  . Bradycardia    SR/SB with up to 3.5 second pause (asymptomatic) on ~ 10/2017 event monitor; saw EP Dr. Rayann Heman: avoid AV nodal blocking agents  . COPD (chronic obstructive pulmonary disease) ([place])   . Depression   . Diabetes mellitus   . Hyperlipidemia   . Hypertension   . PONV (postoperative nausea and vomiting)   . Stroke ([place])    BP (!) 174/73   Pulse (!) 58   Temp (!) 97.5 F (36.4 C)   Ht 5\' 3"  (1.6 m)   Wt 170 lb 6.4 oz (77.3 kg)   LMP 12/10/2012 (Exact Date)   SpO2 97%   BMI 30.19 kg/m   Opioid Risk Score:   Fall Risk Score:  `1  Depression screen PHQ 2/9  Depression screen [facility] 2/9 05/31/2020 05/22/2020 04/11/2019 03/23/2018 01/31/2018 01/19/2018 01/11/2018  Decreased Interest 0 0 0 0 0 1 1  Down, Depressed, Hopeless 0 0 0 0 0 1 1  PHQ - 2 Score 0 0 0 0 0 2 2  Altered sleeping - - - - - 0 -  Tired, decreased energy - - - - - 2 -   Change in appetite - - - - - 1 -  Feeling bad or failure about yourself  - - - - - 0 -  Trouble concentrating - - - - - 0 -  Moving slowly or fidgety/restless - - - - - 0 -  Suicidal thoughts - - - - - 0 -  PHQ-9 Score - - - - - 5 -  Difficult doing work/chores - - - - - - -  Review of Systems  Constitutional: Negative.   HENT: Negative.   Eyes: Negative.   Respiratory: Negative.   Cardiovascular: Negative.   Gastrointestinal: Negative.   Endocrine: Negative.   Genitourinary: Negative.   Musculoskeletal: Negative.   Skin: Negative.   Allergic/Immunologic: Negative.   Neurological: Positive for weakness.       Right side  Hematological: Negative.   Psychiatric/Behavioral: Negative.   All other systems reviewed and are negative.      Objective:   Physical Exam Vitals and nursing note reviewed.  Constitutional:      Appearance: She is obese.  HENT:     Head: Normocephalic and atraumatic.  Eyes:     Extraocular Movements: Extraocular movements intact.     Conjunctiva/sclera: Conjunctivae normal.     Pupils: Pupils are equal, round, and reactive to light.  Neurological:     Mental Status: She is alert and oriented to person, place, and time.     Cranial Nerves: Dysarthria present.     Motor: Weakness and abnormal muscle tone present.     Comments: Mild to moderate dysarthria with decreased vocal volume regulation.  Motor strength is 3 - at the right deltoid to minus right tricep and bicep trace finger flexion 0 finger extension 0 wrist flexion and extension Tone Right upper extremity Elbow flexors MAS 2 wrist flexors MAS 3 Finger flexors MAS 3 Thumb flexor MAS 3 Right lower extremity normal tone at the quads and hamstrings Sustained clonus at the ankle  Gait was not tested the patient did not have her hemiwalker with today  Psychiatric:        Mood and Affect: Mood normal.    Left foot no pain with range of motion no joint swelling in the foot or ankle area.  There is a area of numbness over the metatarsal heads on the left foot.       Assessment & Plan:  #1. Right spastic hemiparesis secondary to left CVA. She had in the past benefited from the botulinum toxin injections but due to Covid has not followed up in clinic. We will reinstate and make the following dosing adjustments.  RUE FDS 50U FDP  50U FCU 50U FCR 50U FPL 50U  RLE Gastrosoleus 150U  She may require some additional therapy after injections. She has the potential to benefit from a myoelectric orthosis although once her tone is reduced this may make fitting easier.  #2. Left foot numbness this is on the plantar surface. No sign of foot atrophy, this is most likely related to a diabetic neuropathy. Since it is not painful we will not start any gabapentin.

## 2020-06-10 ENCOUNTER — Other Ambulatory Visit: Payer: Self-pay | Admitting: Family Medicine

## 2020-06-10 MED ORDER — ROSUVASTATIN CALCIUM 40 MG PO TABS: ORAL_TABLET | ORAL | 0 refills | Status: DC

## 2020-06-13 ENCOUNTER — Ambulatory Visit: Payer: Medicare Other | Admitting: Podiatry

## 2020-06-13 ENCOUNTER — Encounter: Payer: Self-pay | Admitting: Podiatry

## 2020-06-13 ENCOUNTER — Other Ambulatory Visit: Payer: Self-pay

## 2020-06-13 DIAGNOSIS — N1832 Chronic kidney disease, stage 3b: Secondary | ICD-10-CM | POA: Diagnosis not present

## 2020-06-13 DIAGNOSIS — M2041 Other hammer toe(s) (acquired), right foot: Secondary | ICD-10-CM

## 2020-06-13 DIAGNOSIS — Z794 Long term (current) use of insulin: Secondary | ICD-10-CM

## 2020-06-13 DIAGNOSIS — I69351 Hemiplegia and hemiparesis following cerebral infarction affecting right dominant side: Secondary | ICD-10-CM

## 2020-06-13 DIAGNOSIS — R269 Unspecified abnormalities of gait and mobility: Secondary | ICD-10-CM

## 2020-06-13 DIAGNOSIS — B351 Tinea unguium: Secondary | ICD-10-CM

## 2020-06-13 DIAGNOSIS — M79674 Pain in right toe(s): Secondary | ICD-10-CM

## 2020-06-13 DIAGNOSIS — I69398 Other sequelae of cerebral infarction: Secondary | ICD-10-CM

## 2020-06-13 DIAGNOSIS — M21612 Bunion of left foot: Secondary | ICD-10-CM

## 2020-06-13 DIAGNOSIS — E1140 Type 2 diabetes mellitus with diabetic neuropathy, unspecified: Secondary | ICD-10-CM | POA: Diagnosis not present

## 2020-06-13 DIAGNOSIS — M79675 Pain in left toe(s): Secondary | ICD-10-CM | POA: Diagnosis not present

## 2020-06-13 DIAGNOSIS — M2042 Other hammer toe(s) (acquired), left foot: Secondary | ICD-10-CM

## 2020-06-13 DIAGNOSIS — M2012 Hallux valgus (acquired), left foot: Secondary | ICD-10-CM

## 2020-06-13 DIAGNOSIS — M21611 Bunion of right foot: Secondary | ICD-10-CM

## 2020-06-13 DIAGNOSIS — M2011 Hallux valgus (acquired), right foot: Secondary | ICD-10-CM

## 2020-06-13 MED ORDER — LIDOCAINE-PRILOCAINE 2.5-2.5 % EX CREA: 1.0000 "application " | TOPICAL_CREAM | CUTANEOUS | 0 refills | Status: DC | PRN

## 2020-06-13 NOTE — Progress Notes (Signed)
  Subjective:  Patient ID: Kelly Gibson, female    DOB: 08/31/1960,  MRN: [id number]  Chief Complaint  Patient presents with  . Nail Problem    thick painful toenails  . Diabetes    A1C 6.5, foot exam  . Chronic Kidney Disease    stage 68    59 y.o. female presents with the above complaint. History confirmed with patient. She has a history of CVA with R sided weakness. She has pain in the toes of the right foot  Objective:  Physical Exam: warm, good capillary refill, no trophic changes or ulcerative lesions and normal DP and PT pulses. Decreased protective sensation. 3/5 strength RLE. Onychomycosis and pincer nails noted.  Assessment:   1. Stage 3b chronic kidney disease ([place])   2. Hemiparesis affecting right side as late effect of cerebrovascular accident (CVA) ([place])   3. Gait disturbance, post-stroke   4. Type 2 diabetes mellitus with diabetic neuropathy, with long-term current use of insulin ([place])   5. Onychomycosis   6. Pain due to onychomycosis of toenails of both feet   7. Hammertoe of right foot   8. Hammertoe of left foot   9. Hallux valgus with bunions, left   10. Hallux valgus with bunions, right      Plan:  Patient was evaluated and treated and all questions answered.  Patient educated on diabetes. Discussed proper diabetic foot care and discussed risks and complications of disease. Educated patient in depth on reasons to return to the office immediately should he/she discover anything concerning or new on the feet. All questions answered. Discussed proper shoes as well.   Discussed the etiology and treatment options for the condition in detail with the patient. Educated patient on the topical and oral treatment options for mycotic nails. Recommended debridement of the nails today. Sharp and mechanical debridement performed of all painful and mycotic nails today. Nails debrided in length and thickness using a nail nipper and a mechanical burr to level of comfort.  Discussed treatment options including appropriate shoe gear. Follow up as needed for painful nails.  For the pain in the toes I suspect this is DPN (her A1c was much higher pre-stroke). I recommend topical treatment with EMLA cream PRN  Return in about 3 months (around 09/13/2020) for diabetic nail trim.

## 2020-06-27 ENCOUNTER — Encounter: Payer: Self-pay | Admitting: Family Medicine

## 2020-06-28 ENCOUNTER — Other Ambulatory Visit: Payer: Self-pay | Admitting: Family Medicine

## 2020-06-29 ENCOUNTER — Other Ambulatory Visit: Payer: Self-pay | Admitting: Family Medicine

## 2020-07-01 ENCOUNTER — Other Ambulatory Visit: Payer: Self-pay | Admitting: Family Medicine

## 2020-07-01 NOTE — Telephone Encounter (Signed)
Ok to refill??  Last office visit 05/22/2020.  Last refill 01/31/2020, #3 refills.

## 2020-07-02 MED ORDER — AMLODIPINE BESYLATE 10 MG PO TABS
10.0000 mg | ORAL_TABLET | Freq: Every day | ORAL | 0 refills | Status: DC
Start: 2020-07-02 — End: 2021-03-24

## 2020-07-03 MED ORDER — ZOLPIDEM TARTRATE ER 6.25 MG PO TBCR
6.2500 mg | EXTENDED_RELEASE_TABLET | Freq: Every evening | ORAL | 3 refills | Status: DC | PRN
Start: 2020-07-03 — End: 2020-10-25

## 2020-07-03 NOTE — Telephone Encounter (Signed)
Please re-submit as transmission to pharmacy failed.

## 2020-07-03 NOTE — Addendum Note (Signed)
Addended by: Sheral Flow on: 07/03/2020 08:46 AM   Modules accepted: Orders

## 2020-07-04 ENCOUNTER — Encounter: Payer: Medicare Other | Attending: Physical Medicine & Rehabilitation | Admitting: Physical Medicine & Rehabilitation

## 2020-07-04 ENCOUNTER — Other Ambulatory Visit: Payer: Self-pay

## 2020-07-04 ENCOUNTER — Encounter: Payer: Self-pay | Admitting: Physical Medicine & Rehabilitation

## 2020-07-04 VITALS — BP 182/81 | HR 51 | Temp 97.8°F

## 2020-07-04 DIAGNOSIS — G8111 Spastic hemiplegia affecting right dominant side: Secondary | ICD-10-CM | POA: Insufficient documentation

## 2020-07-04 DIAGNOSIS — I69351 Hemiplegia and hemiparesis following cerebral infarction affecting right dominant side: Secondary | ICD-10-CM

## 2020-07-04 NOTE — Patient Instructions (Signed)
You received a Botox injection today. You may experience soreness at the needle injection sites. Please call us if any of the injection sites turns red after a couple days or if there is any drainage. You may experience muscle weakness as a result of Botox. This would improve with time but can take several weeks to improve. The Botox should start working in about one week. The Botox usually last 3 months. The injection can be repeated every 3 months as needed. RUE FDS 50U FDP  50U FCU 50U FCR 50U FPL 50U  RLE Gastrosoleus 150U

## 2020-07-04 NOTE — Progress Notes (Signed)
Botox Injection for spasticity using needle EMG guidance  Dilution: 50 Units/ml Indication: Severe spasticity which interferes with ADL,mobility and/or  hygiene and is unresponsive to medication management and other conservative care Informed consent was obtained after describing risks and benefits of the procedure with the patient. This includes bleeding, bruising, infection, excessive weakness, or medication side effects. A REMS form is on file and signed. Needle: 41mm 2" needle electrode Number of units per muscle RUE FDS 50U FDP  50U FCU 50U FCR 50U FPL 50U  RLE Gastrosoleus 150U All injections were done after obtaining appropriate EMG activity and after negative drawback for blood. The patient tolerated the procedure well. Post procedure instructions were given. A followup appointment was made.

## 2020-07-09 ENCOUNTER — Other Ambulatory Visit: Payer: Self-pay | Admitting: Family Medicine

## 2020-07-09 NOTE — Telephone Encounter (Signed)
Ok to refill 

## 2020-07-22 ENCOUNTER — Other Ambulatory Visit: Payer: Self-pay | Admitting: Podiatry

## 2020-07-22 ENCOUNTER — Other Ambulatory Visit: Payer: Self-pay

## 2020-07-22 MED ORDER — FUROSEMIDE 20 MG PO TABS
20.0000 mg | ORAL_TABLET | Freq: Every day | ORAL | 6 refills | Status: DC | PRN
Start: 2020-07-22 — End: 2021-02-03

## 2020-07-23 NOTE — Telephone Encounter (Signed)
Please advise 

## 2020-07-24 MED ORDER — LIDOCAINE-PRILOCAINE 2.5-2.5 % EX CREA
1.0000 | TOPICAL_CREAM | CUTANEOUS | 0 refills | Status: DC | PRN
Start: 2020-07-24 — End: 2020-10-08

## 2020-08-15 ENCOUNTER — Encounter: Payer: Medicare Other | Attending: Physical Medicine & Rehabilitation | Admitting: Physical Medicine & Rehabilitation

## 2020-08-15 DIAGNOSIS — G8111 Spastic hemiplegia affecting right dominant side: Secondary | ICD-10-CM | POA: Insufficient documentation

## 2020-09-11 DIAGNOSIS — G8111 Spastic hemiplegia affecting right dominant side: Secondary | ICD-10-CM | POA: Diagnosis not present

## 2020-09-13 ENCOUNTER — Ambulatory Visit: Payer: Medicare Other | Admitting: Podiatry

## 2020-09-23 ENCOUNTER — Ambulatory Visit: Payer: Medicare Other | Admitting: Podiatry

## 2020-10-04 ENCOUNTER — Other Ambulatory Visit: Payer: Self-pay | Admitting: Family Medicine

## 2020-10-07 ENCOUNTER — Telehealth: Payer: Self-pay | Admitting: *Deleted

## 2020-10-07 ENCOUNTER — Other Ambulatory Visit: Payer: Self-pay | Admitting: Family Medicine

## 2020-10-07 NOTE — Telephone Encounter (Signed)
Received PA determination.   PA approved through 07/26/2021. 

## 2020-10-07 NOTE — Telephone Encounter (Signed)
Received request from pharmacy for PA on Glyburide.   PA submitted.   Dx: E11.9- DM  [facility] is reviewing your PA request. Typically an electronic response will be received within 24-72 hours. To check for an update later, open this request from your dashboard.

## 2020-10-08 ENCOUNTER — Ambulatory Visit: Payer: Medicare Other | Admitting: Podiatry

## 2020-10-08 ENCOUNTER — Other Ambulatory Visit: Payer: Self-pay

## 2020-10-08 ENCOUNTER — Encounter: Payer: Self-pay | Admitting: Podiatry

## 2020-10-08 DIAGNOSIS — N1832 Chronic kidney disease, stage 3b: Secondary | ICD-10-CM | POA: Diagnosis not present

## 2020-10-08 DIAGNOSIS — E1140 Type 2 diabetes mellitus with diabetic neuropathy, unspecified: Secondary | ICD-10-CM

## 2020-10-08 DIAGNOSIS — M79674 Pain in right toe(s): Secondary | ICD-10-CM | POA: Diagnosis not present

## 2020-10-08 DIAGNOSIS — R269 Unspecified abnormalities of gait and mobility: Secondary | ICD-10-CM

## 2020-10-08 DIAGNOSIS — B351 Tinea unguium: Secondary | ICD-10-CM

## 2020-10-08 DIAGNOSIS — G63 Polyneuropathy in diseases classified elsewhere: Secondary | ICD-10-CM

## 2020-10-08 DIAGNOSIS — I69398 Other sequelae of cerebral infarction: Secondary | ICD-10-CM | POA: Diagnosis not present

## 2020-10-08 DIAGNOSIS — I69351 Hemiplegia and hemiparesis following cerebral infarction affecting right dominant side: Secondary | ICD-10-CM

## 2020-10-08 DIAGNOSIS — M79675 Pain in left toe(s): Secondary | ICD-10-CM

## 2020-10-08 DIAGNOSIS — Z794 Long term (current) use of insulin: Secondary | ICD-10-CM

## 2020-10-08 MED ORDER — LIDOCAINE-PRILOCAINE 2.5-2.5 % EX CREA: 1.0000 "application " | TOPICAL_CREAM | CUTANEOUS | 2 refills | Status: DC | PRN

## 2020-10-08 NOTE — Progress Notes (Signed)
  Subjective:  Patient ID: Kelly Gibson, female    DOB: 04/13/1961,  MRN: [id number]  Chief Complaint  Patient presents with  . Nail Problem    Nail trim     60 y.o. female return with the above complaint. History confirmed with patient. She has a history of CVA with R sided weakness. She has pain in the toes of the both feet but the right is more sensitive.  Here with her husband today.  She uses a wheelchair.  Objective:  Physical Exam: warm, good capillary refill, no trophic changes or ulcerative lesions and normal DP and PT pulses. Decreased protective sensation.  Paresthesias bilaterally 3/5 strength RLE. Onychomycosis and pincer nails noted.  Assessment:   No diagnosis found.   Plan:  Patient was evaluated and treated and all questions answered.  Patient educated on diabetes. Discussed proper diabetic foot care and discussed risks and complications of disease. Educated patient in depth on reasons to return to the office immediately should he/she discover anything concerning or new on the feet. All questions answered. Discussed proper shoes as well.   Discussed the etiology and treatment options for the condition in detail with the patient. Educated patient on the topical and oral treatment options for mycotic nails. Recommended debridement of the nails today. Sharp and mechanical debridement performed of all painful and mycotic nails today. Nails debrided in length and thickness using a nail nipper and a mechanical burr to level of comfort. Discussed treatment options including appropriate shoe gear. Follow up as needed for painful nails.  Lidocaine prilocaine cream has been very helpful for her.  I sent a new prescription of this with a refill which she can continue to use as needed  Return in about 3 months (around 01/08/2021) for at risk diabetic foot care.

## 2020-10-14 ENCOUNTER — Ambulatory Visit: Payer: Medicare Other | Admitting: Family Medicine

## 2020-10-25 ENCOUNTER — Other Ambulatory Visit: Payer: Self-pay | Admitting: Family Medicine

## 2020-11-04 ENCOUNTER — Ambulatory Visit (INDEPENDENT_AMBULATORY_CARE_PROVIDER_SITE_OTHER): Payer: Medicare Other | Admitting: Family Medicine

## 2020-11-04 ENCOUNTER — Encounter: Payer: Self-pay | Admitting: Family Medicine

## 2020-11-04 ENCOUNTER — Other Ambulatory Visit: Payer: Self-pay

## 2020-11-04 VITALS — BP 140/80 | HR 52 | Ht 63.0 in | Wt 170.0 lb

## 2020-11-04 DIAGNOSIS — I69351 Hemiplegia and hemiparesis following cerebral infarction affecting right dominant side: Secondary | ICD-10-CM

## 2020-11-04 DIAGNOSIS — F331 Major depressive disorder, recurrent, moderate: Secondary | ICD-10-CM

## 2020-11-04 DIAGNOSIS — J449 Chronic obstructive pulmonary disease, unspecified: Secondary | ICD-10-CM

## 2020-11-04 DIAGNOSIS — E785 Hyperlipidemia, unspecified: Secondary | ICD-10-CM

## 2020-11-04 DIAGNOSIS — F32A Depression, unspecified: Secondary | ICD-10-CM | POA: Diagnosis not present

## 2020-11-04 DIAGNOSIS — E1140 Type 2 diabetes mellitus with diabetic neuropathy, unspecified: Secondary | ICD-10-CM

## 2020-11-04 DIAGNOSIS — I69398 Other sequelae of cerebral infarction: Secondary | ICD-10-CM

## 2020-11-04 DIAGNOSIS — R269 Unspecified abnormalities of gait and mobility: Secondary | ICD-10-CM

## 2020-11-04 DIAGNOSIS — Z794 Long term (current) use of insulin: Secondary | ICD-10-CM

## 2020-11-04 DIAGNOSIS — I69391 Dysphagia following cerebral infarction: Secondary | ICD-10-CM

## 2020-11-04 DIAGNOSIS — R45851 Suicidal ideations: Secondary | ICD-10-CM

## 2020-11-04 DIAGNOSIS — J410 Simple chronic bronchitis: Secondary | ICD-10-CM

## 2020-11-04 DIAGNOSIS — G4700 Insomnia, unspecified: Secondary | ICD-10-CM | POA: Insufficient documentation

## 2020-11-04 DIAGNOSIS — I10 Essential (primary) hypertension: Secondary | ICD-10-CM

## 2020-11-04 DIAGNOSIS — I69322 Dysarthria following cerebral infarction: Secondary | ICD-10-CM

## 2020-11-04 NOTE — Progress Notes (Signed)
Subjective:    Patient ID: Kelly Gibson, female    DOB: 07/12/1961, 60 y.o.   MRN: [id number]  HPI Chief Complaint  Patient presents with  . new pt     New pt get established. No concerns   She is new to the practice and here to establish care. Her husband is with her. She is in a Glass blower/designer.  Previous medical care: Dr. Buelah Manis, [facility]   Other providers: Neuro - has not been there in 2 years    History of CVA with deficits including right side paralysis, aphasia and unsteady gait.   She and her husband are requesting referral to specific OT for assistance with robotic arm which she has at home. States referral needed for OT Valentino Hue   She also requests referral for adaptive driving controls- Sharee Pimple or Larene Beach, OT with [facility].  They are wanting her to be cleared for driving and then she can qualify for a vehicle with adaptive controls.   States she is able to walk some with walker in the house. Otherwise in a wheelchair.   DM- glyburide 5 mg only.  BS at home 95-110. Checks 2 times per week.    HTN- taking amlodipine 10 mg, Lasix 20 mg.   HL- Crestor daily   COPD- Incruse most days. Does not need albuterol often.   Takes Zanaflex at night.   Takes Ambien every night and has been for the year or two. States the current dose is not working as well as it once did.   States she is dealing with severe depression. Has suicidal thoughts but states she does not have a plan and would not actually harm herself. States her stroke and limitations are the main reason for this.  Her husband was not aware of the severity of her depression.   Denies fever, chills, dizziness, chest pain, palpitations, shortness of breath, abdominal pain, N/V/D, urinary symptoms.      Depression screen [facility] 2/9 11/04/2020 05/31/2020 05/22/2020 04/11/2019 03/23/2018  Decreased Interest 3 0 0 0 0  Down, Depressed, Hopeless 3 0 0 0 0  PHQ - 2 Score 6 0 0 0 0   Altered sleeping 3 - - - -  Tired, decreased energy 3 - - - -  Change in appetite 0 - - - -  Feeling bad or failure about yourself  3 - - - -  Trouble concentrating 3 - - - -  Moving slowly or fidgety/restless 0 - - - -  Suicidal thoughts 3 - - - -  PHQ-9 Score 21 - - - -  Difficult doing work/chores Somewhat difficult - - - -    Smoked for years.  Stopped smoking 5-6 years ago.    Reviewed allergies, medications, past medical, surgical, family, and social history.    Review of Systems Pertinent positives and negatives in the history of present illness.     Objective:   Physical Exam BP 140/80   Pulse (!) 52   Ht '5\' 3"'$  (1.6 m)   Wt 170 lb (77.1 kg)   LMP 12/10/2012 (Exact Date)   SpO2 97%   BMI 30.11 kg/m   Alert and oriented and in no distress. Respirations unlabored. Sitting on jazzy chair. Right arm weakness noted. Speech slightly slow and slurred but       Assessment & Plan:  Type 2 diabetes mellitus with diabetic neuropathy, with long-term current use of insulin ([place]) - Plan: CBC with  Differential/Platelet, Comprehensive metabolic panel, Hemoglobin A1c -Diabetes has been controlled on glyburide only.  Last hemoglobin A1c 6 months ago.  She and her husband report checking her blood sugars on average 1-2 times per week.  Asymptomatic.  Check labs and follow-up  Moderate episode of recurrent major depressive disorder [facility]) -Referral to psychiatry.  She has never been on medication or been involved in counseling for this.  Depression with suicidal ideation -Verbal contract that she will not harm herself.  Her husband is with her and states patient is never left alone except while she is sleeping at times.  Referral to psychiatry.  I also encouraged her to go to [facility] behavioral health urgent care which is just on the street.  Hemiparesis affecting right side as late effect of cerebrovascular accident (CVA) ([place]) - Plan: Ambulatory referral to  Neurology -Referral to OT per patient request. I am also referring her back to neurology since she and her husband are requesting that she be cleared to drive.  Essential hypertension, benign - Plan: CBC with Differential/Platelet, Comprehensive metabolic panel -Blood pressure very close to goal range today.  Continue current medication.  Recommend low-sodium diet and keeping a close eye on her blood pressure outside of here.  Hyperlipidemia, unspecified hyperlipidemia type -Continue statin therapy.  She is not fasting today.  Gait disturbance, post-stroke - Plan: Ambulatory referral to Neurology -Referral to OT per patient request.  I am also referring her back to neurology.  Again, she is requesting to be cleared to drive and this will need to be determined by neurology.  Dysarthria, post-stroke - Plan: Ambulatory referral to Neurology  Insomnia, unspecified type -Discussed that I am not in favor of keeping her on nightly Ambien.  Discussed that I will help her to wean off of this medication if she would like.  She verbalized that she does not want to wean off of the medication and cannot sleep without it.  Referral to psychiatry and neurology for further assistance.  Chronic obstructive pulmonary disease, unspecified COPD type ([place]) -Appears to be controlled and is not needing albuterol.  She will continue Incruse Ellipta

## 2020-11-04 NOTE — Patient Instructions (Signed)
You can call to schedule your appointment with the psychiatrist/counselor. A few offices are listed below for you to call.    [facility]  [place], [place], [place] [address]  Phone: [phone]  [place]  Ask for a psychiatrist  [place]  (across from [facility])  [place] [address] [place], [place] [address]  Phone: [phone]

## 2020-11-05 ENCOUNTER — Encounter: Payer: Self-pay | Admitting: Family Medicine

## 2020-11-05 LAB — CBC WITH DIFFERENTIAL/PLATELET
Basophils Absolute: 0.1 10*3/uL (ref 0.0–0.2)
Basos: 1 %
EOS (ABSOLUTE): 0.2 10*3/uL (ref 0.0–0.4)
Eos: 4 %
Hematocrit: 39.7 % (ref 34.0–46.6)
Hemoglobin: 13.4 g/dL (ref 11.1–15.9)
Immature Grans (Abs): 0 10*3/uL (ref 0.0–0.1)
Immature Granulocytes: 0 %
Lymphocytes Absolute: 1.8 10*3/uL (ref 0.7–3.1)
Lymphs: 33 %
MCH: 28.5 pg (ref 26.6–33.0)
MCHC: 33.8 g/dL (ref 31.5–35.7)
MCV: 84 fL (ref 79–97)
Monocytes Absolute: 0.6 10*3/uL (ref 0.1–0.9)
Monocytes: 10 %
Neutrophils Absolute: 2.9 10*3/uL (ref 1.4–7.0)
Neutrophils: 52 %
Platelets: 286 10*3/uL (ref 150–450)
RBC: 4.71 x10E6/uL (ref 3.77–5.28)
RDW: 13.2 % (ref 11.7–15.4)
WBC: 5.6 10*3/uL (ref 3.4–10.8)

## 2020-11-05 LAB — COMPREHENSIVE METABOLIC PANEL
ALT: 11 IU/L (ref 0–32)
AST: 17 IU/L (ref 0–40)
Albumin/Globulin Ratio: 1.5 (ref 1.2–2.2)
Albumin: 4.4 g/dL (ref 3.8–4.9)
Alkaline Phosphatase: 90 IU/L (ref 44–121)
BUN/Creatinine Ratio: 13 (ref 9–23)
BUN: 17 mg/dL (ref 6–24)
Bilirubin Total: 0.3 mg/dL (ref 0.0–1.2)
CO2: 21 mmol/L (ref 20–29)
Calcium: 9.6 mg/dL (ref 8.7–10.2)
Chloride: 102 mmol/L (ref 96–106)
Creatinine, Ser: 1.36 mg/dL — ABNORMAL HIGH (ref 0.57–1.00)
Globulin, Total: 3 g/dL (ref 1.5–4.5)
Glucose: 143 mg/dL — ABNORMAL HIGH (ref 65–99)
Potassium: 4 mmol/L (ref 3.5–5.2)
Sodium: 140 mmol/L (ref 134–144)
Total Protein: 7.4 g/dL (ref 6.0–8.5)
eGFR: 45 mL/min/{1.73_m2} — ABNORMAL LOW (ref >59)

## 2020-11-05 LAB — HEMOGLOBIN A1C
Est. average glucose Bld gHb Est-mCnc: 157 mg/dL
Hgb A1c MFr Bld: 7.1 % — ABNORMAL HIGH (ref 4.8–5.6)

## 2020-11-05 NOTE — Progress Notes (Signed)
Her diabetes is worse with a Hgb A1c 7.1%. we will need to discuss increasing or adding medication and discuss her diet at her follow up visit. Otherwise her labs are stable and she does still have some mild kidney disease.

## 2020-11-06 ENCOUNTER — Telehealth: Payer: Self-pay | Admitting: Internal Medicine

## 2020-11-06 NOTE — Telephone Encounter (Signed)
I have faxed over referrals to OT for robotic arm, and OT for adaptive driving controls to OT [facility]. Fax [phone]. Phone number is [phone]

## 2020-11-19 ENCOUNTER — Telehealth: Payer: Self-pay | Admitting: Internal Medicine

## 2020-11-19 NOTE — Telephone Encounter (Signed)
Kelly Gibson oked or order to be sent

## 2020-11-19 NOTE — Telephone Encounter (Signed)
Caryl Pina from [facility] called and needs another referral order for Evaluation and treatment and use of myomo device instead of just the adaptive driving order. Please sign off for this

## 2020-11-25 ENCOUNTER — Encounter: Payer: Self-pay | Admitting: Internal Medicine

## 2020-12-03 ENCOUNTER — Ambulatory Visit: Payer: Medicare Other | Admitting: Family Medicine

## 2020-12-24 ENCOUNTER — Ambulatory Visit: Payer: Medicare Other | Admitting: Family Medicine

## 2020-12-24 ENCOUNTER — Other Ambulatory Visit: Payer: Self-pay | Admitting: Family Medicine

## 2020-12-27 ENCOUNTER — Telehealth: Payer: Self-pay | Admitting: Internal Medicine

## 2020-12-27 ENCOUNTER — Telehealth: Payer: Self-pay

## 2020-12-27 MED ORDER — ROSUVASTATIN CALCIUM 40 MG PO TABS: ORAL_TABLET | ORAL | 0 refills | Status: DC

## 2020-12-27 NOTE — Telephone Encounter (Signed)
Received fax from [facility] for a refill on the pts. Rosuvastatin pt. Last apt was 11/04/20.

## 2020-12-27 NOTE — Telephone Encounter (Signed)
Left a message for pt to call me back. Wanted to see if she scheduled her eye exam

## 2020-12-27 NOTE — Telephone Encounter (Signed)
Sent to pharmacy 

## 2021-01-06 ENCOUNTER — Ambulatory Visit (INDEPENDENT_AMBULATORY_CARE_PROVIDER_SITE_OTHER): Payer: Medicare Other | Admitting: Family Medicine

## 2021-01-06 ENCOUNTER — Encounter: Payer: Self-pay | Admitting: Family Medicine

## 2021-01-06 VITALS — BP 130/80 | HR 47

## 2021-01-06 DIAGNOSIS — E1140 Type 2 diabetes mellitus with diabetic neuropathy, unspecified: Secondary | ICD-10-CM | POA: Diagnosis not present

## 2021-01-06 DIAGNOSIS — I69351 Hemiplegia and hemiparesis following cerebral infarction affecting right dominant side: Secondary | ICD-10-CM

## 2021-01-06 DIAGNOSIS — E785 Hyperlipidemia, unspecified: Secondary | ICD-10-CM

## 2021-01-06 DIAGNOSIS — G4700 Insomnia, unspecified: Secondary | ICD-10-CM

## 2021-01-06 DIAGNOSIS — N1832 Chronic kidney disease, stage 3b: Secondary | ICD-10-CM | POA: Diagnosis not present

## 2021-01-06 DIAGNOSIS — J449 Chronic obstructive pulmonary disease, unspecified: Secondary | ICD-10-CM

## 2021-01-06 DIAGNOSIS — Z794 Long term (current) use of insulin: Secondary | ICD-10-CM

## 2021-01-06 DIAGNOSIS — I10 Essential (primary) hypertension: Secondary | ICD-10-CM | POA: Diagnosis not present

## 2021-01-06 NOTE — Patient Instructions (Signed)
It was a pleasure seeing you today.  I am glad you are doing better.  Continue your current medications.  You will need a follow up diabetes and fasting lab office visit in approximately 3 months.

## 2021-01-06 NOTE — Progress Notes (Signed)
   Subjective:    Patient ID: Kelly Gibson, female    DOB: 08/03/1960, 60 y.o.   MRN: [id number]  HPI Chief Complaint  Patient presents with   Follow-up    Follow-up on multiple issues.    This is her 2nd visit at this practice.  Her husband is with her today.  She is in her Transport planner. She is here to discuss concerns.   Diagnosed with COVID approximately 10 days ago and states she is feeling back to baseline.  Her husband also had COVID.  History of CVA with right-sided paralysis, aphasia and unsteady gait.  She is getting therapy.  DM- last Hgb A1c 7.1% on 04/11/20222 Checks her BS at home approximately 2 times per week.  Denies any issues with her medications.  Hypertension-reports taking amlodipine daily.  She has not taken Lasix recently.  States this is as needed.  COPD-uses maintenance inhaler daily and has not needed albuterol.  Denies difficulty breathing.  Denies depression.  At her last visit she was severely depressed and had suicidal ideations.  She denies feeling depressed or SI today.  States her mood is good.  Sleeping well and is not using Ambien any longer.  Husband states she has been coughing a lot in her sleep recently since having COVID.  Denies history of sleep apnea and states she was tested for it many years ago.  Husband states she does not snore and does not have apnea  Denies fever, chills, dizziness, chest pain, palpitations, shortness of breath, abdominal pain, nausea, vomiting, diarrhea or LE edema.    Review of Systems Pertinent positives and negatives in the history of present illness.     Objective:   Physical Exam BP 130/80   Pulse (!) 47 Comment: per pt, usually that low  LMP 12/10/2012 (Exact Date)   Alert and oriented in no acute distress.  Respirations unlabored.  Normal mood.      Assessment & Plan:  Type 2 diabetes mellitus with diabetic neuropathy, with long-term current use of insulin ([place]) -Discussed that her  last hemoglobin A1c in April was 7.1%.  She will continue on her current medication and continue keeping an eye on her blood sugars at home.  Recommend low carbohydrate diet. Advised her to call and schedule diabetic eye exam.  Stage 3b chronic kidney disease ([place]) -Discussed that we need to keep an eye on her kidney function.  Discussed direct control of blood pressure and diabetes to prevent this from worsening.  Consider referral to nephrology if renal function is worsening.  Hyperlipidemia, unspecified hyperlipidemia type -Continue statin therapy.  Essential hypertension, benign -Blood pressure in goal range today.  Continue medication.  She recalls taking an ACE or ARB in the past and states her previous PCP stopped the medication and she is not sure why.  Chronic obstructive pulmonary disease, unspecified COPD type ([place]) -Continue daily maintenance inhaler.  Has not had an exacerbation or needed albuterol recently.  Insomnia, unspecified type -No longer taking Ambien and sleeping well.  Hemiparesis affecting right side as late effect of cerebrovascular accident (CVA) ([place]) -She is involved in PT.

## 2021-01-07 ENCOUNTER — Ambulatory Visit: Payer: Medicare Other | Admitting: Podiatry

## 2021-01-14 ENCOUNTER — Other Ambulatory Visit: Payer: Self-pay

## 2021-01-14 MED ORDER — TIZANIDINE HCL 4 MG PO TABS: ORAL_TABLET | ORAL | 2 refills | Status: DC

## 2021-02-03 ENCOUNTER — Telehealth: Payer: Self-pay

## 2021-02-03 DIAGNOSIS — I69398 Other sequelae of cerebral infarction: Secondary | ICD-10-CM | POA: Diagnosis not present

## 2021-02-03 DIAGNOSIS — R29898 Other symptoms and signs involving the musculoskeletal system: Secondary | ICD-10-CM | POA: Diagnosis not present

## 2021-02-03 DIAGNOSIS — I69951 Hemiplegia and hemiparesis following unspecified cerebrovascular disease affecting right dominant side: Secondary | ICD-10-CM | POA: Diagnosis not present

## 2021-02-03 DIAGNOSIS — R252 Cramp and spasm: Secondary | ICD-10-CM | POA: Diagnosis not present

## 2021-02-03 DIAGNOSIS — Z789 Other specified health status: Secondary | ICD-10-CM | POA: Diagnosis not present

## 2021-02-03 DIAGNOSIS — R279 Unspecified lack of coordination: Secondary | ICD-10-CM | POA: Diagnosis not present

## 2021-02-03 MED ORDER — FUROSEMIDE 20 MG PO TABS: 20.0000 mg | ORAL_TABLET | Freq: Every day | ORAL | 0 refills | Status: DC | PRN

## 2021-02-03 NOTE — Addendum Note (Signed)
Addended byRita Ohara on: 02/03/2021 01:29 PM   Modules accepted: Orders

## 2021-02-03 NOTE — Telephone Encounter (Signed)
Received fax from [facility] for a refill on the pts. Furosemide pt. Last apt was 01/06/21.

## 2021-02-03 NOTE — Telephone Encounter (Signed)
Chart reviewed.  Uses it prn.

## 2021-02-03 NOTE — Telephone Encounter (Signed)
Please advise. Pt has never had this filled here before

## 2021-02-19 ENCOUNTER — Other Ambulatory Visit: Payer: Self-pay | Admitting: Family Medicine

## 2021-02-25 DIAGNOSIS — R279 Unspecified lack of coordination: Secondary | ICD-10-CM | POA: Diagnosis not present

## 2021-02-25 DIAGNOSIS — I69951 Hemiplegia and hemiparesis following unspecified cerebrovascular disease affecting right dominant side: Secondary | ICD-10-CM | POA: Diagnosis not present

## 2021-02-25 DIAGNOSIS — G8111 Spastic hemiplegia affecting right dominant side: Secondary | ICD-10-CM | POA: Diagnosis not present

## 2021-02-25 DIAGNOSIS — R29898 Other symptoms and signs involving the musculoskeletal system: Secondary | ICD-10-CM | POA: Diagnosis not present

## 2021-02-25 DIAGNOSIS — R252 Cramp and spasm: Secondary | ICD-10-CM | POA: Diagnosis not present

## 2021-02-25 DIAGNOSIS — Z789 Other specified health status: Secondary | ICD-10-CM | POA: Diagnosis not present

## 2021-02-25 DIAGNOSIS — I69398 Other sequelae of cerebral infarction: Secondary | ICD-10-CM | POA: Diagnosis not present

## 2021-02-28 DIAGNOSIS — Z8616 Personal history of COVID-19: Secondary | ICD-10-CM | POA: Diagnosis not present

## 2021-02-28 DIAGNOSIS — G8111 Spastic hemiplegia affecting right dominant side: Secondary | ICD-10-CM | POA: Diagnosis not present

## 2021-03-04 ENCOUNTER — Other Ambulatory Visit: Payer: Self-pay | Admitting: Family Medicine

## 2021-03-04 DIAGNOSIS — R29898 Other symptoms and signs involving the musculoskeletal system: Secondary | ICD-10-CM | POA: Diagnosis not present

## 2021-03-04 DIAGNOSIS — I69951 Hemiplegia and hemiparesis following unspecified cerebrovascular disease affecting right dominant side: Secondary | ICD-10-CM | POA: Diagnosis not present

## 2021-03-04 DIAGNOSIS — R279 Unspecified lack of coordination: Secondary | ICD-10-CM | POA: Diagnosis not present

## 2021-03-04 DIAGNOSIS — G8111 Spastic hemiplegia affecting right dominant side: Secondary | ICD-10-CM | POA: Diagnosis not present

## 2021-03-04 DIAGNOSIS — I69398 Other sequelae of cerebral infarction: Secondary | ICD-10-CM | POA: Diagnosis not present

## 2021-03-04 DIAGNOSIS — R252 Cramp and spasm: Secondary | ICD-10-CM | POA: Diagnosis not present

## 2021-03-04 DIAGNOSIS — Z789 Other specified health status: Secondary | ICD-10-CM | POA: Diagnosis not present

## 2021-03-05 ENCOUNTER — Encounter: Payer: Self-pay | Admitting: Internal Medicine

## 2021-03-05 ENCOUNTER — Other Ambulatory Visit: Payer: Self-pay | Admitting: Family Medicine

## 2021-03-12 ENCOUNTER — Other Ambulatory Visit: Payer: Self-pay | Admitting: Family Medicine

## 2021-03-21 DIAGNOSIS — R252 Cramp and spasm: Secondary | ICD-10-CM | POA: Diagnosis not present

## 2021-03-21 DIAGNOSIS — Z789 Other specified health status: Secondary | ICD-10-CM | POA: Diagnosis not present

## 2021-03-21 DIAGNOSIS — I69951 Hemiplegia and hemiparesis following unspecified cerebrovascular disease affecting right dominant side: Secondary | ICD-10-CM | POA: Diagnosis not present

## 2021-03-21 DIAGNOSIS — G8111 Spastic hemiplegia affecting right dominant side: Secondary | ICD-10-CM | POA: Diagnosis not present

## 2021-03-21 DIAGNOSIS — I69398 Other sequelae of cerebral infarction: Secondary | ICD-10-CM | POA: Diagnosis not present

## 2021-03-21 DIAGNOSIS — R279 Unspecified lack of coordination: Secondary | ICD-10-CM | POA: Diagnosis not present

## 2021-03-21 DIAGNOSIS — R29898 Other symptoms and signs involving the musculoskeletal system: Secondary | ICD-10-CM | POA: Diagnosis not present

## 2021-03-24 ENCOUNTER — Other Ambulatory Visit: Payer: Self-pay

## 2021-03-24 MED ORDER — AMLODIPINE BESYLATE 10 MG PO TABS: 10.0000 mg | ORAL_TABLET | Freq: Every day | ORAL | 0 refills | Status: DC

## 2021-04-01 DIAGNOSIS — I69398 Other sequelae of cerebral infarction: Secondary | ICD-10-CM | POA: Diagnosis not present

## 2021-04-01 DIAGNOSIS — I679 Cerebrovascular disease, unspecified: Secondary | ICD-10-CM | POA: Diagnosis not present

## 2021-04-01 DIAGNOSIS — G8111 Spastic hemiplegia affecting right dominant side: Secondary | ICD-10-CM | POA: Diagnosis not present

## 2021-04-01 DIAGNOSIS — R29898 Other symptoms and signs involving the musculoskeletal system: Secondary | ICD-10-CM | POA: Diagnosis not present

## 2021-04-01 DIAGNOSIS — R252 Cramp and spasm: Secondary | ICD-10-CM | POA: Diagnosis not present

## 2021-04-01 DIAGNOSIS — I69951 Hemiplegia and hemiparesis following unspecified cerebrovascular disease affecting right dominant side: Secondary | ICD-10-CM | POA: Diagnosis not present

## 2021-04-01 DIAGNOSIS — Z789 Other specified health status: Secondary | ICD-10-CM | POA: Diagnosis not present

## 2021-04-01 DIAGNOSIS — R279 Unspecified lack of coordination: Secondary | ICD-10-CM | POA: Diagnosis not present

## 2021-04-01 DIAGNOSIS — I69351 Hemiplegia and hemiparesis following cerebral infarction affecting right dominant side: Secondary | ICD-10-CM | POA: Diagnosis not present

## 2021-04-08 DIAGNOSIS — R279 Unspecified lack of coordination: Secondary | ICD-10-CM | POA: Diagnosis not present

## 2021-04-08 DIAGNOSIS — R252 Cramp and spasm: Secondary | ICD-10-CM | POA: Diagnosis not present

## 2021-04-08 DIAGNOSIS — R29898 Other symptoms and signs involving the musculoskeletal system: Secondary | ICD-10-CM | POA: Diagnosis not present

## 2021-04-08 DIAGNOSIS — I679 Cerebrovascular disease, unspecified: Secondary | ICD-10-CM | POA: Diagnosis not present

## 2021-04-08 DIAGNOSIS — I69951 Hemiplegia and hemiparesis following unspecified cerebrovascular disease affecting right dominant side: Secondary | ICD-10-CM | POA: Diagnosis not present

## 2021-04-08 DIAGNOSIS — I69398 Other sequelae of cerebral infarction: Secondary | ICD-10-CM | POA: Diagnosis not present

## 2021-04-08 DIAGNOSIS — G8111 Spastic hemiplegia affecting right dominant side: Secondary | ICD-10-CM | POA: Diagnosis not present

## 2021-04-08 DIAGNOSIS — Z789 Other specified health status: Secondary | ICD-10-CM | POA: Diagnosis not present

## 2021-04-08 DIAGNOSIS — I69351 Hemiplegia and hemiparesis following cerebral infarction affecting right dominant side: Secondary | ICD-10-CM | POA: Diagnosis not present

## 2021-04-09 ENCOUNTER — Other Ambulatory Visit: Payer: Self-pay | Admitting: Family Medicine

## 2021-04-10 ENCOUNTER — Other Ambulatory Visit: Payer: Self-pay | Admitting: Family Medicine

## 2021-04-10 DIAGNOSIS — G8111 Spastic hemiplegia affecting right dominant side: Secondary | ICD-10-CM

## 2021-04-14 ENCOUNTER — Other Ambulatory Visit: Payer: Self-pay | Admitting: Family Medicine

## 2021-04-15 ENCOUNTER — Telehealth: Payer: Self-pay

## 2021-04-15 DIAGNOSIS — I69351 Hemiplegia and hemiparesis following cerebral infarction affecting right dominant side: Secondary | ICD-10-CM | POA: Diagnosis not present

## 2021-04-15 DIAGNOSIS — R252 Cramp and spasm: Secondary | ICD-10-CM | POA: Diagnosis not present

## 2021-04-15 DIAGNOSIS — I679 Cerebrovascular disease, unspecified: Secondary | ICD-10-CM | POA: Diagnosis not present

## 2021-04-15 DIAGNOSIS — I69951 Hemiplegia and hemiparesis following unspecified cerebrovascular disease affecting right dominant side: Secondary | ICD-10-CM | POA: Diagnosis not present

## 2021-04-15 DIAGNOSIS — R279 Unspecified lack of coordination: Secondary | ICD-10-CM | POA: Diagnosis not present

## 2021-04-15 DIAGNOSIS — Z789 Other specified health status: Secondary | ICD-10-CM | POA: Diagnosis not present

## 2021-04-15 DIAGNOSIS — I69398 Other sequelae of cerebral infarction: Secondary | ICD-10-CM | POA: Diagnosis not present

## 2021-04-15 DIAGNOSIS — G8111 Spastic hemiplegia affecting right dominant side: Secondary | ICD-10-CM | POA: Diagnosis not present

## 2021-04-15 DIAGNOSIS — R29898 Other symptoms and signs involving the musculoskeletal system: Secondary | ICD-10-CM | POA: Diagnosis not present

## 2021-04-15 NOTE — Telephone Encounter (Signed)
Received fax from [facility] for a refill on the pts. Tizanidine last apt was 01/06/21 and next apt is 04/19/21.

## 2021-04-19 ENCOUNTER — Ambulatory Visit (INDEPENDENT_AMBULATORY_CARE_PROVIDER_SITE_OTHER): Payer: Medicare Other

## 2021-04-19 ENCOUNTER — Other Ambulatory Visit: Payer: Self-pay

## 2021-04-19 DIAGNOSIS — Z1211 Encounter for screening for malignant neoplasm of colon: Secondary | ICD-10-CM

## 2021-04-19 DIAGNOSIS — Z1231 Encounter for screening mammogram for malignant neoplasm of breast: Secondary | ICD-10-CM

## 2021-04-19 DIAGNOSIS — Z0000 Encounter for general adult medical examination without abnormal findings: Secondary | ICD-10-CM

## 2021-04-19 NOTE — Patient Instructions (Addendum)
Advance Directive Advance directives are legal documents that allow you to make decisions about your health care and medical treatment in case you become unable to communicate for yourself. Advance directives let your wishes be known to family, friends, and health care providers. Discussing and writing advance directives should happen over time rather than all at once. Advance directives can be changed and updated at any time. There are different types of advance directives, such as: Medical power of attorney. Living will. Do not resuscitate (DNR) order or do not attempt resuscitation (DNAR) order. Health care proxy and medical power of attorney A health care proxy is also called a health care agent. This person is appointed to make medical decisions for you when you are unable to make decisions for yourself. Generally, people ask a trusted friend or family member to act as their proxy and represent their preferences. Make sure you have an agreement with your trusted person to act as your proxy. A proxy may have to make a medical decision on your behalf if your wishes are not known. A medical power of attorney, also called a durable power of attorney for health care, is a legal document that names your health care proxy. Depending on the laws in your state, the document may need to be: Signed. Notarized. Dated. Copied. Witnessed. Incorporated into your medical record. You may also want to appoint a trusted person to manage your money in the event you are unable to do so. This is called a durable power of attorney for finances. It is a separate legal document from the durable power of attorney for health care. You may choose your health care proxy or someone different to act as your agent in money matters. If you do not appoint a proxy, or there is a concern that the proxy is not acting in your best interest, a court may appoint a guardian to act on your behalf. Living will A living will is a set of  instructions that state your wishes about medical care when you cannot express them yourself. Health care providers should keep a copy of your living will in your medical record. You may want to give a copy to family members or friends. To alert caregivers in case of an emergency, you can place a card in your wallet to let them know that you have a living will and where they can find it. A living will is used if you become: Terminally ill. Disabled. Unable to communicate or make decisions. The following decisions should be included in your living will: To use or not to use life support equipment, such as dialysis machines and breathing machines (ventilators). Whether you want a DNR or DNAR order. This tells health care providers not to use cardiopulmonary resuscitation (CPR) if breathing or heartbeat stops. To use or not to use tube feeding. To be given or not to be given food and fluids. Whether you want comfort (palliative) care when the goal becomes comfort rather than a cure. Whether you want to donate your organs and tissues. A living will does not give instructions for distributing your money and property if you should pass away. DNR or DNAR A DNR or DNAR order is a request not to have CPR in the event that your heart stops beating or you stop breathing. If a DNR or DNAR order has not been made and shared, a health care provider will try to help any patient whose heart has stopped or who has stopped breathing. If  you plan to have surgery, talk with your health care provider about how your DNR or DNAR order will be followed if problems occur. What if I do not have an advance directive? Some states assign family decision makers to act on your behalf if you do not have an advance directive. Each state has its own laws about advance directives. You may want to check with your health care provider, attorney, or state representative about the laws in your state. Summary Advance directives are legal  documents that allow you to make decisions about your health care and medical treatment in case you become unable to communicate for yourself. The process of discussing and writing advance directives should happen over time. You can change and update advance directives at any time. Advance directives may include a medical power of attorney, a living will, and a DNR or DNAR order. This information is not intended to replace advice given to you by your health care provider. Make sure you discuss any questions you have with your health care provider. Document Revised: 04/16/2020 Document Reviewed: 04/16/2020 Elsevier Patient Education  [place] Maintenance, Female Adopting a healthy lifestyle and getting preventive care are important in promoting health and wellness. Ask your health care provider about: The right schedule for you to have regular tests and exams. Things you can do on your own to prevent diseases and keep yourself healthy. What should I know about diet, weight, and exercise? Eat a healthy diet  Eat a diet that includes plenty of vegetables, fruits, low-fat dairy products, and lean protein. Do not eat a lot of foods that are high in solid fats, added sugars, or sodium. Maintain a healthy weight Body mass index (BMI) is used to identify weight problems. It estimates body fat based on height and weight. Your health care provider can help determine your BMI and help you achieve or maintain a healthy weight. Get regular exercise Get regular exercise. This is one of the most important things you can do for your health. Most adults should: Exercise for at least 150 minutes each week. The exercise should increase your heart rate and make you sweat (moderate-intensity exercise). Do strengthening exercises at least twice a week. This is in addition to the moderate-intensity exercise. Spend less time sitting. Even light physical activity can be beneficial. Watch cholesterol  and blood lipids Have your blood tested for lipids and cholesterol at 59 years of age, then have this test every 5 years. Have your cholesterol levels checked more often if: Your lipid or cholesterol levels are high. You are older than 59 years of age. You are at high risk for heart disease. What should I know about cancer screening? Depending on your health history and family history, you may need to have cancer screening at various ages. This may include screening for: Breast cancer. Cervical cancer. Colorectal cancer. Skin cancer. Lung cancer. What should I know about heart disease, diabetes, and high blood pressure? Blood pressure and heart disease High blood pressure causes heart disease and increases the risk of stroke. This is more likely to develop in people who have high blood pressure readings, are of African descent, or are overweight. Have your blood pressure checked: Every 3-5 years if you are 32-19 years of age. Every year if you are 8 years old or older. Diabetes Have regular diabetes screenings. This checks your fasting blood sugar level. Have the screening done: Once every three years after age 101 if you are at a normal  weight and have a low risk for diabetes. More often and at a younger age if you are overweight or have a high risk for diabetes. What should I know about preventing infection? Hepatitis B If you have a higher risk for hepatitis B, you should be screened for this virus. Talk with your health care provider to find out if you are at risk for hepatitis B infection. Hepatitis C Testing is recommended for: Everyone born from [birth year] through 1964. Anyone with known risk factors for hepatitis C. Sexually transmitted infections (STIs) Get screened for STIs, including gonorrhea and chlamydia, if: You are sexually active and are younger than 59 years of age. You are older than 59 years of age and your health care provider tells you that you are at risk for this  type of infection. Your sexual activity has changed since you were last screened, and you are at increased risk for chlamydia or gonorrhea. Ask your health care provider if you are at risk. Ask your health care provider about whether you are at high risk for HIV. Your health care provider may recommend a prescription medicine to help prevent HIV infection. If you choose to take medicine to prevent HIV, you should first get tested for HIV. You should then be tested every 3 months for as long as you are taking the medicine. Pregnancy If you are about to stop having your period (premenopausal) and you may become pregnant, seek counseling before you get pregnant. Take 400 to 800 micrograms (mcg) of folic acid every day if you become pregnant. Ask for birth control (contraception) if you want to prevent pregnancy. Osteoporosis and menopause Osteoporosis is a disease in which the bones lose minerals and strength with aging. This can result in bone fractures. If you are 65 years old or older, or if you are at risk for osteoporosis and fractures, ask your health care provider if you should: Be screened for bone loss. Take a calcium or vitamin D supplement to lower your risk of fractures. Be given hormone replacement therapy (HRT) to treat symptoms of menopause. Follow these instructions at home: Lifestyle Do not use any products that contain nicotine or tobacco, such as cigarettes, e-cigarettes, and chewing tobacco. If you need help quitting, ask your health care provider. Do not use street drugs. Do not share needles. Ask your health care provider for help if you need support or information about quitting drugs. Alcohol use Do not drink alcohol if: Your health care provider tells you not to drink. You are pregnant, may be pregnant, or are planning to become pregnant. If you drink alcohol: Limit how much you use to 0-1 drink a day. Limit intake if you are breastfeeding. Be aware of how much alcohol  is in your drink. In the [place] one drink equals one 12 oz bottle of beer (355 mL), one 5 oz glass of wine (148 mL), or one 1 oz glass of hard liquor (44 mL). General instructions Schedule regular health, dental, and eye exams. Stay current with your vaccines. Tell your health care provider if: You often feel depressed. You have ever been abused or do not feel safe at home. Summary Adopting a healthy lifestyle and getting preventive care are important in promoting health and wellness. Follow your health care provider's instructions about healthy diet, exercising, and getting tested or screened for diseases. Follow your health care provider's instructions on monitoring your cholesterol and blood pressure. This information is not intended to replace advice given to you by  your health care provider. Make sure you discuss any questions you have with your health care provider. Document Revised: 09/20/2020 Document Reviewed: 07/06/2018 Elsevier Patient Education  2022 [employer].

## 2021-04-19 NOTE — Progress Notes (Signed)
Subjective:   Kelly Gibson is a 60 y.o. female who presents for an Initial Medicare Annual Wellness Visit. I connected with  Kelly Gibson on 04/19/21 by a video enabled telemedicine application and verified that I am speaking with the correct person using two identifiers.   I discussed the limitations of evaluation and management by telemedicine. The patient expressed understanding and agreed to proceed.   Location of patient: Home Location of provider:  office   Review of Systems    Defer to PCP        Objective:    There were no vitals filed for this visit. There is no height or weight on file to calculate BMI.  Advanced Directives 04/19/2021 05/18/2019 05/16/2019 04/28/2019 02/27/2019 05/04/2018 04/18/2018  Does Patient Have a Medical Advance Directive? No No No No No No No  Would patient like information on creating a medical advance directive? Yes (ED - Information included in AVS) No - Patient declined No - Patient declined No - Patient declined - No - Patient declined No - Patient declined    Current Medications (verified) Outpatient Encounter Medications as of 04/19/2021  Medication Sig   albuterol (VENTOLIN HFA) 108 (90 Base) MCG/ACT inhaler Inhale 2 puffs into the lungs every 6 (six) hours as needed for wheezing or shortness of breath.   amLODipine (NORVASC) 10 MG tablet Take 1 tablet (10 mg total) by mouth daily.   aspirin EC 325 MG EC tablet Take 1 tablet (325 mg total) by mouth daily.   clotrimazole-betamethasone (LOTRISONE) cream Apply 1 application topically 2 (two) times daily. (Patient taking differently: Apply 1 application topically 2 (two) times daily. BEHIND THE EARS)   furosemide (LASIX) 20 MG tablet TAKE ONE TABLET BY MOUTH DAILY AS NEEDED   Glucose Blood (BLOOD GLUCOSE TEST STRIPS) STRP Use as directed to monitor FSBS 2x daily. Dx: E11.9.   glyBURIDE (DIABETA) 5 MG tablet TAKE ONE TABLET BY MOUTH TWICE A DAY WITH MEALS   lidocaine-prilocaine (EMLA)  cream Apply 1 application topically as needed.   rosuvastatin (CRESTOR) 40 MG tablet TAKE ONE TABLET BY MOUTH DAILY AT 6 O'CLOCK IN THE EVENING   tiZANidine (ZANAFLEX) 4 MG tablet TAKE ONE TABLET BY MOUTH EVERY 6 HOURS AS NEEDED FOR MUSCLE SPASMS   umeclidinium bromide (INCRUSE ELLIPTA) 62.5 MCG/INH AEPB Inhale 1 puff into the lungs daily.   No facility-administered encounter medications on file as of 04/19/2021.    Allergies (verified) Niacin and related   History: Past Medical History:  Diagnosis Date   Bradycardia    SR/SB with up to 3.5 second pause (asymptomatic) on ~ 10/2017 event monitor; saw EP Dr. Rayann Heman: avoid AV nodal blocking agents   COPD (chronic obstructive pulmonary disease) ([place])    Depression    Diabetes mellitus    Hyperlipidemia    Hypertension    PONV (postoperative nausea and vomiting)    Stroke [facility])    Past Surgical History:  Procedure Laterality Date   CESAREAN SECTION     x 3   ORIF WRIST FRACTURE Right 05/04/2018   Procedure: OPEN REDUCTION INTERNAL FIXATION (ORIF) RIGHT  WRIST FRACTURE;  Surgeon: Hiram Gash, MD;  Location: [place];  Service: Orthopedics;  Laterality: Right;   TUMOR REMOVAL     left shoulder   Family History  Problem Relation Age of Onset   Diabetes Mother    Stroke Mother    Heart disease Father    Cancer Father    Stroke Father  Social History   Socioeconomic History   Marital status: Married    Spouse name: Not on file   Number of children: Not on file   Years of education: Not on file   Highest education level: Not on file  Occupational History   Not on file  Tobacco Use   Smoking status: Former    Packs/day: 1.00    Years: 20.00    Pack years: 20.00    Types: Cigarettes   Smokeless tobacco: Never   Tobacco comments:    stopped 2018  Vaping Use   Vaping Use: Never used  Substance and Sexual Activity   Alcohol use: No   Drug use: No   Sexual activity: Not on file  Other Topics Concern   Not on file   Social History Narrative   Not on file   Social Determinants of Health   Financial Resource Strain: Low Risk    Difficulty of Paying Living Expenses: Not hard at all  Food Insecurity: No Food Insecurity   Worried About Charity fundraiser in the Last Year: Never true   [place] in the Last Year: Never true  Transportation Needs: No Transportation Needs   Lack of Transportation (Medical): No   Lack of Transportation (Non-Medical): No  Physical Activity: Not on file  Stress: No Stress Concern Present   Feeling of Stress : Not at all  Social Connections: Socially Isolated   Frequency of Communication with Friends and Family: Twice a week   Frequency of Social Gatherings with Friends and Family: Never   Attends Religious Services: Never   Marine scientist or Organizations: No   Attends Music therapist: Never   Marital Status: Married    Tobacco Counseling Counseling given: Not Answered Tobacco comments: stopped 2018   Clinical Intake:  Pre-visit preparation completed: Yes  Pain : No/denies pain     Diabetes: Yes CBG done?: No Did pt. bring in CBG monitor from home?: No  How often do you need to have someone help you when you read instructions, pamphlets, or other written materials from your doctor or pharmacy?: 1 - Never  Diabetic Yes  Interpreter Needed?: No      Activities of Daily Living No flowsheet data found.  Patient Care Team: Girtha Rm, NP-C as PCP - General (Family Medicine) Debara Pickett Nadean Corwin, MD as PCP - Cardiology (Cardiology) Pcp, No  Indicate any recent Medical Services you may have received from other than [facility] providers in the past year (date may be approximate).     Assessment:   This is a routine wellness examination for [name].  Hearing/Vision screen No results found.  Dietary issues and exercise activities discussed:     Goals Addressed   None   Depression Screen PHQ 2/9 Scores 04/19/2021  01/06/2021 11/04/2020 05/31/2020 05/22/2020 04/11/2019 03/23/2018  PHQ - 2 Score 1 0 6 0 0 0 0  PHQ- 9 Score - - 21 - - - -    Fall Risk Fall Risk  04/19/2021 01/06/2021 11/04/2020 05/31/2020 05/22/2020  Falls in the past year? 1 0 0 0 0  Number falls in past yr: 0 0 0 - 0  Injury with Fall? 0 0 0 - 0  Comment - - - - -  Risk Factor Category  - - - - -  Risk for fall due to : Impaired mobility No Fall Risks No Fall Risks Impaired balance/gait -  Follow up - Falls evaluation completed  Falls evaluation completed - Falls evaluation completed    FALL RISK PREVENTION PERTAINING TO THE HOME:  Any stairs in or around the home? Yes  If so, are there any without handrails? No  Home free of loose throw rugs in walkways, pet beds, electrical cords, etc? Yes  Adequate lighting in your home to reduce risk of falls? Yes   ASSISTIVE DEVICES UTILIZED TO PREVENT FALLS:  Life alert? No  Use of a cane, walker or w/c? Yes  Grab bars in the bathroom? Yes  Shower chair or bench in shower? Yes  Elevated toilet seat or a handicapped toilet? Yes   TIMED UP AND GO:  Was the test performed?  n/a .  Length of time to ambulate 10 feet: n/a sec.     Cognitive Function:     6CIT Screen 04/19/2021  What Year? 0 points  What month? 0 points  What time? 0 points  Count back from 20 4 points  Months in reverse 4 points  Repeat phrase 0 points  Total Score 8    Immunizations Immunization History  Administered Date(s) Administered   Influenza,inj,Quad PF,6+ Mos 06/16/2013, 04/11/2019, 05/22/2020   Janssen (J&J) SARS-COV-2 Vaccination 09/25/2019   Pneumococcal Polysaccharide-23 02/18/2015    TDAP status: Due, Education has been provided regarding the importance of this vaccine. Advised may receive this vaccine at local pharmacy or Health Dept. Aware to provide a copy of the vaccination record if obtained from local pharmacy or Health Dept. Verbalized acceptance and understanding.  Flu Vaccine status:  Due, Education has been provided regarding the importance of this vaccine. Advised may receive this vaccine at local pharmacy or Health Dept. Aware to provide a copy of the vaccination record if obtained from local pharmacy or Health Dept. Verbalized acceptance and understanding.  Pneumococcal vaccine status: Due, Education has been provided regarding the importance of this vaccine. Advised may receive this vaccine at local pharmacy or Health Dept. Aware to provide a copy of the vaccination record if obtained from local pharmacy or Health Dept. Verbalized acceptance and understanding.  Covid-19 vaccine status: Information provided on how to obtain vaccines.   Qualifies for Shingles Vaccine? Yes   Zostavax completed No   Shingrix Completed?: No.    Education has been provided regarding the importance of this vaccine. Patient has been advised to call insurance company to determine out of pocket expense if they have not yet received this vaccine. Advised may also receive vaccine at local pharmacy or Health Dept. Verbalized acceptance and understanding.  Screening Tests Health Maintenance  Topic Date Due   OPHTHALMOLOGY EXAM  Never done   TETANUS/TDAP  Never done   PAP SMEAR-Modifier  Never done   COLONOSCOPY (Pts 45-51yr Insurance coverage will need to be confirmed)  Never done   MAMMOGRAM  Never done   Zoster Vaccines- Shingrix (1 of 2) Never done   URINE MICROALBUMIN  12/26/2016   COVID-19 Vaccine (2 - Booster for Janssen series) 11/20/2019   FOOT EXAM  04/10/2020   INFLUENZA VACCINE  02/24/2021   HEMOGLOBIN A1C  05/06/2021   Hepatitis C Screening  Completed   HIV Screening  Completed   HPV VACCINES  Aged Out    Health Maintenance  Health Maintenance Due  Topic Date Due   OPHTHALMOLOGY EXAM  Never done   TETANUS/TDAP  Never done   PAP SMEAR-Modifier  Never done   COLONOSCOPY (Pts 45-459yrInsurance coverage will need to be confirmed)  Never done   MAMMOGRAM  Never  done   Zoster  Vaccines- Shingrix (1 of 2) Never done   URINE MICROALBUMIN  12/26/2016   COVID-19 Vaccine (2 - Booster for Janssen series) 11/20/2019   FOOT EXAM  04/10/2020   INFLUENZA VACCINE  02/24/2021    Colorectal cancer screening: Referral to GI placed 04/19/2021. Pt aware the office will call re: appt.  Mammogram status: Ordered 04/19/2021. Pt provided with contact info and advised to call to schedule appt.   Bone Density Status: patient not yet qualified due to age  Lung Cancer Screening: (Low Dose CT Chest recommended if Age 22-80 years, 30 pack-year currently smoking OR have quit w/in 15years.) does qualify.   Lung Cancer Screening Referral: defer to PCP  Additional Screening:  Hepatitis C Screening: does qualify; Completed 11/03/2017  Vision Screening: Recommended annual ophthalmology exams for early detection of glaucoma and other disorders of the eye. Is the patient up to date with their annual eye exam?  No  Who is the provider or what is the name of the office in which the patient attends annual eye exams? Does not have  If pt is not established with a provider, would they like to be referred to a provider to establish care? Yes .   Dental Screening: Recommended annual dental exams for proper oral hygiene  Community Resource Referral / Chronic Care Management: CRR required this visit?  Yes   CCM required this visit?  No      Plan:     I have personally reviewed and noted the following in the patient's chart:   Medical and social history Use of alcohol, tobacco or illicit drugs  Current medications and supplements including opioid prescriptions. Patient is not currently taking opioid prescriptions. Functional ability and status Nutritional status Physical activity Advanced directives List of other physicians Hospitalizations, surgeries, and ER visits in previous 12 months Vitals Screenings to include cognitive, depression, and falls Referrals and appointments  In  addition, I have reviewed and discussed with patient certain preventive protocols, quality metrics, and best practice recommendations. A written personalized care plan for preventive services as well as general preventive health recommendations were provided to patient.     Nat Christen, CMA   04/19/2021   Nurse Notes: Non face to face 30 minute visit.   Ms. Dimeglio , Thank you for taking time to come for your Medicare Wellness Visit. I appreciate your ongoing commitment to your health goals. Please review the following plan we discussed and let me know if I can assist you in the future.   These are the goals we discussed:  Goals   None     This is a list of the screening recommended for you and due dates:  Health Maintenance  Topic Date Due   Eye exam for diabetics  Never done   Tetanus Vaccine  Never done   Pap Smear  Never done   Colon Cancer Screening  Never done   Mammogram  Never done   Zoster (Shingles) Vaccine (1 of 2) Never done   Urine Protein Check  12/26/2016   COVID-19 Vaccine (2 - Booster for Janssen series) 11/20/2019   Complete foot exam   04/10/2020   Flu Shot  02/24/2021   Hemoglobin A1C  05/06/2021   Hepatitis C Screening: USPSTF Recommendation to screen - Ages 18-79 yo.  Completed   HIV Screening  Completed   HPV Vaccine  Aged Out

## 2021-04-24 ENCOUNTER — Telehealth: Payer: Self-pay | Admitting: *Deleted

## 2021-04-24 ENCOUNTER — Other Ambulatory Visit: Payer: Self-pay | Admitting: Family Medicine

## 2021-04-24 MED ORDER — TIZANIDINE HCL 4 MG PO TABS: ORAL_TABLET | ORAL | 0 refills | Status: DC

## 2021-04-24 NOTE — Telephone Encounter (Signed)
Refill request from [facility] for tizanidine '4mg'$  #90 one tablet q 6hrs as needed for muscle spasm.

## 2021-04-24 NOTE — Telephone Encounter (Signed)
Spoke to pt she states she does not see ortho or pain clinic

## 2021-05-06 DIAGNOSIS — R29898 Other symptoms and signs involving the musculoskeletal system: Secondary | ICD-10-CM | POA: Diagnosis not present

## 2021-05-06 DIAGNOSIS — G8111 Spastic hemiplegia affecting right dominant side: Secondary | ICD-10-CM | POA: Diagnosis not present

## 2021-05-06 DIAGNOSIS — R252 Cramp and spasm: Secondary | ICD-10-CM | POA: Diagnosis not present

## 2021-05-06 DIAGNOSIS — Z789 Other specified health status: Secondary | ICD-10-CM | POA: Diagnosis not present

## 2021-05-06 DIAGNOSIS — I69398 Other sequelae of cerebral infarction: Secondary | ICD-10-CM | POA: Diagnosis not present

## 2021-05-06 DIAGNOSIS — I69351 Hemiplegia and hemiparesis following cerebral infarction affecting right dominant side: Secondary | ICD-10-CM | POA: Diagnosis not present

## 2021-05-06 DIAGNOSIS — I69951 Hemiplegia and hemiparesis following unspecified cerebrovascular disease affecting right dominant side: Secondary | ICD-10-CM | POA: Diagnosis not present

## 2021-05-06 DIAGNOSIS — R279 Unspecified lack of coordination: Secondary | ICD-10-CM | POA: Diagnosis not present

## 2021-05-06 DIAGNOSIS — I679 Cerebrovascular disease, unspecified: Secondary | ICD-10-CM | POA: Diagnosis not present

## 2021-05-08 ENCOUNTER — Encounter: Payer: Medicare Other | Admitting: Family Medicine

## 2021-05-13 DIAGNOSIS — I679 Cerebrovascular disease, unspecified: Secondary | ICD-10-CM | POA: Diagnosis not present

## 2021-05-13 DIAGNOSIS — R279 Unspecified lack of coordination: Secondary | ICD-10-CM | POA: Diagnosis not present

## 2021-05-13 DIAGNOSIS — Z789 Other specified health status: Secondary | ICD-10-CM | POA: Diagnosis not present

## 2021-05-13 DIAGNOSIS — I69351 Hemiplegia and hemiparesis following cerebral infarction affecting right dominant side: Secondary | ICD-10-CM | POA: Diagnosis not present

## 2021-05-13 DIAGNOSIS — I69398 Other sequelae of cerebral infarction: Secondary | ICD-10-CM | POA: Diagnosis not present

## 2021-05-13 DIAGNOSIS — R29898 Other symptoms and signs involving the musculoskeletal system: Secondary | ICD-10-CM | POA: Diagnosis not present

## 2021-05-13 DIAGNOSIS — R252 Cramp and spasm: Secondary | ICD-10-CM | POA: Diagnosis not present

## 2021-05-13 DIAGNOSIS — G8111 Spastic hemiplegia affecting right dominant side: Secondary | ICD-10-CM | POA: Diagnosis not present

## 2021-05-13 DIAGNOSIS — I69951 Hemiplegia and hemiparesis following unspecified cerebrovascular disease affecting right dominant side: Secondary | ICD-10-CM | POA: Diagnosis not present

## 2021-06-06 ENCOUNTER — Other Ambulatory Visit: Payer: Self-pay | Admitting: Medical

## 2021-06-06 ENCOUNTER — Telehealth: Payer: Self-pay

## 2021-06-06 MED ORDER — TIZANIDINE HCL 4 MG PO TABS: ORAL_TABLET | ORAL | 0 refills | Status: DC

## 2021-06-06 NOTE — Telephone Encounter (Signed)
Received fax from [facility] for a refill on the pts. Tizanidine pt. Last apt was 04/19/21.

## 2021-06-07 ENCOUNTER — Other Ambulatory Visit: Payer: Self-pay

## 2021-06-23 ENCOUNTER — Other Ambulatory Visit: Payer: Self-pay

## 2021-06-23 ENCOUNTER — Telehealth: Payer: Self-pay

## 2021-06-23 MED ORDER — AMLODIPINE BESYLATE 10 MG PO TABS
10.0000 mg | ORAL_TABLET | Freq: Every day | ORAL | 0 refills | Status: DC
Start: 2021-06-23 — End: 2021-08-29

## 2021-06-23 MED ORDER — ROSUVASTATIN CALCIUM 40 MG PO TABS: ORAL_TABLET | ORAL | 0 refills | Status: DC

## 2021-06-23 NOTE — Telephone Encounter (Signed)
Done [facility] 

## 2021-06-23 NOTE — Telephone Encounter (Signed)
Recv'd refill request from [facility] for Amlodipine, Rosuvastatin, both for 90 days

## 2021-06-25 ENCOUNTER — Ambulatory Visit: Payer: Medicare Other

## 2021-06-27 NOTE — Telephone Encounter (Signed)
done

## 2021-07-10 ENCOUNTER — Encounter: Payer: Self-pay | Admitting: Family Medicine

## 2021-07-10 ENCOUNTER — Other Ambulatory Visit: Payer: Self-pay | Admitting: Family Medicine

## 2021-07-10 ENCOUNTER — Other Ambulatory Visit: Payer: Self-pay | Admitting: Medical

## 2021-07-10 NOTE — Telephone Encounter (Signed)
done

## 2021-07-16 ENCOUNTER — Telehealth: Payer: Self-pay

## 2021-07-16 NOTE — Telephone Encounter (Signed)
Received fax from [facility] on [address] For a refill on the pts. Zolpidem 6.25mg  last apt was 04/19/21.

## 2021-07-18 ENCOUNTER — Other Ambulatory Visit: Payer: Self-pay | Admitting: Medical

## 2021-07-22 NOTE — Telephone Encounter (Signed)
This encounter was created in error - please disregard.

## 2021-08-05 ENCOUNTER — Telehealth: Payer: Self-pay | Admitting: Family Medicine

## 2021-08-05 MED ORDER — TIZANIDINE HCL 4 MG PO TABS: ORAL_TABLET | ORAL | 0 refills | Status: DC

## 2021-08-05 NOTE — Telephone Encounter (Signed)
Kelly Gibson. She is requesting refill on Tizanidine sent tot the [facility] on [address]. She is scheduled for Diabetes check on 1/20

## 2021-08-05 NOTE — Telephone Encounter (Signed)
refilled 

## 2021-08-15 ENCOUNTER — Ambulatory Visit: Payer: Medicare Other | Admitting: Medical

## 2021-08-29 ENCOUNTER — Other Ambulatory Visit: Payer: Self-pay | Admitting: Medical

## 2021-08-29 ENCOUNTER — Other Ambulatory Visit: Payer: Self-pay

## 2021-08-29 ENCOUNTER — Ambulatory Visit (INDEPENDENT_AMBULATORY_CARE_PROVIDER_SITE_OTHER): Payer: Medicare Other | Admitting: Physician Assistant

## 2021-08-29 VITALS — BP 138/70 | HR 56 | Ht 63.0 in | Wt 177.6 lb

## 2021-08-29 DIAGNOSIS — E1140 Type 2 diabetes mellitus with diabetic neuropathy, unspecified: Secondary | ICD-10-CM | POA: Diagnosis not present

## 2021-08-29 DIAGNOSIS — N1832 Chronic kidney disease, stage 3b: Secondary | ICD-10-CM | POA: Diagnosis not present

## 2021-08-29 DIAGNOSIS — Z794 Long term (current) use of insulin: Secondary | ICD-10-CM

## 2021-08-29 DIAGNOSIS — G8111 Spastic hemiplegia affecting right dominant side: Secondary | ICD-10-CM

## 2021-08-29 DIAGNOSIS — Z8673 Personal history of transient ischemic attack (TIA), and cerebral infarction without residual deficits: Secondary | ICD-10-CM

## 2021-08-29 DIAGNOSIS — I10 Essential (primary) hypertension: Secondary | ICD-10-CM

## 2021-08-29 DIAGNOSIS — F418 Other specified anxiety disorders: Secondary | ICD-10-CM

## 2021-08-29 DIAGNOSIS — J410 Simple chronic bronchitis: Secondary | ICD-10-CM | POA: Diagnosis not present

## 2021-08-29 DIAGNOSIS — E782 Mixed hyperlipidemia: Secondary | ICD-10-CM

## 2021-08-29 DIAGNOSIS — E1169 Type 2 diabetes mellitus with other specified complication: Secondary | ICD-10-CM | POA: Insufficient documentation

## 2021-08-29 DIAGNOSIS — I69391 Dysphagia following cerebral infarction: Secondary | ICD-10-CM

## 2021-08-29 HISTORY — DX: Other specified anxiety disorders: F41.8

## 2021-08-29 MED ORDER — UMECLIDINIUM BROMIDE 62.5 MCG/ACT IN AEPB: 1.0000 | INHALATION_SPRAY | Freq: Every day | RESPIRATORY_TRACT | 2 refills | Status: DC

## 2021-08-29 MED ORDER — ROSUVASTATIN CALCIUM 40 MG PO TABS: ORAL_TABLET | ORAL | 1 refills | Status: DC

## 2021-08-29 MED ORDER — CITALOPRAM HYDROBROMIDE 10 MG PO TABS: 10.0000 mg | ORAL_TABLET | Freq: Every day | ORAL | 3 refills | Status: DC

## 2021-08-29 MED ORDER — AMLODIPINE BESYLATE 10 MG PO TABS: 10.0000 mg | ORAL_TABLET | Freq: Every day | ORAL | 1 refills | Status: DC

## 2021-08-29 MED ORDER — GLIPIZIDE 5 MG PO TABS: 5.0000 mg | ORAL_TABLET | Freq: Two times a day (BID) | ORAL | 1 refills | Status: DC

## 2021-08-29 MED ORDER — TIZANIDINE HCL 4 MG PO TABS: 4.0000 mg | ORAL_TABLET | Freq: Two times a day (BID) | ORAL | 2 refills | Status: DC

## 2021-08-29 NOTE — Progress Notes (Signed)
Subjective:    Patient ID: Kelly Gibson, female    DOB: 02/18/1961, 61 y.o.   MRN: [id number]  Kelly Gibson is a 61 y.o. female who presents for follow-up of Type 2 diabetes mellitus, accompanied by Husband. He reports that she has trouble breathing sometimes. Declines Pulmonology referral at this time. Both request to have another follow up appointment with Neurology.   Patient is checking home blood sugars.   Home blood sugar records: BGs consistently in an acceptable range How often is blood sugars being checked: once every 1 - 2 weeks Current symptoms include: none.  Patient is checking their feet daily. Any Foot concerns (callous, ulcer, wound, thickened nails, toenail fungus, skin fungus, hammer toe):none Last dilated eye exam: unknown  Current treatments: no recent interventions. Medication compliance: good  Current diet: in general, a "healthy" diet   Current exercise: none Known diabetic complications: none  The following portions of the patient's history were reviewed and updated as appropriate: allergies, current medications, past medical history, past social history and problem list.  ROS: Review of Systems Constitutional: -fever, -chills, -sweats, -unexpected weight change,-fatigue ENT: -runny nose, -ear pain, -sore throat Cardiology:  -chest pain, -palpitations, -edema Respiratory: -cough, -shortness of breath, -wheezing Gastroenterology: -abdominal pain, -nausea, -vomiting, -diarrhea, -constipation  Hematology: -bleeding or bruising problems Musculoskeletal: -arthralgias, -myalgias, -joint swelling, -back pain, -muscle weakness Ophthalmology: -vision changes Urology: -dysuria, -difficulty urinating, -hematuria, -urinary frequency, -urgency Neurology: -headache, +weakness, -tingling, -numbness     Objective:    Physical Exam General Appearance:    Alert, cooperative, no distress, appears stated age  Head:    Normocephalic, without obvious abnormality,  atraumatic  Eyes:    PERRL, conjunctiva/corneas clear, EOM's intact  Ears:    External ear canals  Nose:   Mask on   Throat:   Mask on   Neck:   Supple     Lungs:     Clear to auscultation bilaterally without wheezes, rales or     ronchi; respirations unlabored  Chest Wall:    No tenderness or deformity   Heart:    Regular rate and rhythm, S1 and S2 normal, no murmur, rub   or gallop  Breast Exam:    Deferred  Abdomen:     Soft, non-tender, nondistended, normoactive bowel sounds          Extremities:   No edema  Pulses:   2+ and symmetric all extremities  Skin:   Skin color, texture, turgor normal, no rashes.      Neurologic:   Alert and oriented          Psych:   Normal mood, affect, hygiene and grooming.    Blood pressure 138/70, pulse (!) 56, weight 177 lb 9.6 oz (80.6 kg), last menstrual period 12/10/2012, SpO2 97 %.  Lab Review Diabetic Labs Latest Ref Rng & Units 11/04/2020 05/22/2020 12/18/2019 07/04/2019 05/18/2019  HbA1c 4.8 - 5.6 % 7.1(H) 6.5(H) 6.9(H) 6.6(H) -  Microalbumin Not estab mg/dL - - - - -  Micro/Creat Ratio <30 mcg/mg creat - - - - -  Chol <200 mg/dL - 142 - 140 -  HDL > OR = 50 mg/dL - 63 - 50 -  Calc LDL mg/dL (calc) - 63 - 72 -  Triglycerides <150 mg/dL - 84 - 94 -  Creatinine 0.57 - 1.00 mg/dL 1.36(H) 1.12(H) 1.61(H) 1.30(H) 2.25(H)   BP/Weight 08/29/2021 01/06/2021 11/04/2020 07/04/2020 83/09/8248  Systolic BP 539 767 341 937 902  Diastolic BP 70 80 80  81 73  Wt. (Lbs) 177.6 - 170 - 170.4  BMI 31.46 - 30.11 - 30.19   Foot/eye exam completion dates 04/11/2019 01/10/2013  Foot Form Completion Done Done    Kelly Gibson  reports that she has quit smoking. Her smoking use included cigarettes. She has a 20.00 pack-year smoking history. She has never used smokeless tobacco. She reports that she does not drink alcohol and does not use drugs.     Assessment & Plan:    Stage 3b chronic kidney disease ([place]) - Plan: CBC with Differential/Platelet, Comprehensive  metabolic panel  Simple chronic bronchitis (HCC) - Plan: umeclidinium bromide (INCRUSE ELLIPTA) 62.5 MCG/ACT AEPB  Right spastic hemiplegia (HCC) - Plan: tiZANidine (ZANAFLEX) 4 MG tablet, Ambulatory referral to Neurology  Type 2 diabetes mellitus with diabetic neuropathy, with long-term current use of insulin ([place]) - Plan: Hemoglobin A1c, glipiZIDE (GLUCOTROL) 5 MG tablet  Essential hypertension, benign - Plan: amLODipine (NORVASC) 10 MG tablet  Mixed hyperlipidemia - Plan: rosuvastatin (CRESTOR) 40 MG tablet  Dysphagia, post-stroke - Plan: Ambulatory referral to Neurology  History of CVA (cerebrovascular accident) - Plan: Ambulatory referral to Neurology  Rx changes: none Education: Reviewed ABCs of diabetes management (respective goals in parentheses):  A1C (<7), blood pressure (<130/80), and cholesterol (LDL <100). Compliance at present is estimated to be good. Efforts to improve compliance (if necessary) will be directed at  continue current management . Follow up: 3 month  High Creatinine 2.35 and high hgb a1c 10.8. Will offer referrals to Nephrology and Endocrinology.

## 2021-08-30 LAB — CBC WITH DIFFERENTIAL/PLATELET
Basophils Absolute: 0.1 10*3/uL (ref 0.0–0.2)
Basos: 1 %
EOS (ABSOLUTE): 0.3 10*3/uL (ref 0.0–0.4)
Eos: 4 %
Hematocrit: 39.7 % (ref 34.0–46.6)
Hemoglobin: 13.4 g/dL (ref 11.1–15.9)
Immature Grans (Abs): 0 10*3/uL (ref 0.0–0.1)
Immature Granulocytes: 0 %
Lymphocytes Absolute: 1.9 10*3/uL (ref 0.7–3.1)
Lymphs: 28 %
MCH: 28.3 pg (ref 26.6–33.0)
MCHC: 33.8 g/dL (ref 31.5–35.7)
MCV: 84 fL (ref 79–97)
Monocytes Absolute: 0.5 10*3/uL (ref 0.1–0.9)
Monocytes: 7 %
Neutrophils Absolute: 4 10*3/uL (ref 1.4–7.0)
Neutrophils: 60 %
Platelets: 318 10*3/uL (ref 150–450)
RBC: 4.73 x10E6/uL (ref 3.77–5.28)
RDW: 13.5 % (ref 11.7–15.4)
WBC: 6.6 10*3/uL (ref 3.4–10.8)

## 2021-08-30 LAB — COMPREHENSIVE METABOLIC PANEL
ALT: 11 IU/L (ref 0–32)
AST: 19 IU/L (ref 0–40)
Albumin/Globulin Ratio: 1.3 (ref 1.2–2.2)
Albumin: 4.4 g/dL (ref 3.8–4.9)
Alkaline Phosphatase: 115 IU/L (ref 44–121)
BUN/Creatinine Ratio: 9 (ref 9–23)
BUN: 22 mg/dL (ref 6–24)
Bilirubin Total: 0.3 mg/dL (ref 0.0–1.2)
CO2: 25 mmol/L (ref 20–29)
Calcium: 10.3 mg/dL — ABNORMAL HIGH (ref 8.7–10.2)
Chloride: 100 mmol/L (ref 96–106)
Creatinine, Ser: 2.35 mg/dL — ABNORMAL HIGH (ref 0.57–1.00)
Globulin, Total: 3.3 g/dL (ref 1.5–4.5)
Glucose: 357 mg/dL — ABNORMAL HIGH (ref 70–99)
Potassium: 3.9 mmol/L (ref 3.5–5.2)
Sodium: 141 mmol/L (ref 134–144)
Total Protein: 7.7 g/dL (ref 6.0–8.5)
eGFR: 23 mL/min/{1.73_m2} — ABNORMAL LOW (ref >59)

## 2021-08-30 LAB — HEMOGLOBIN A1C
Est. average glucose Bld gHb Est-mCnc: 263 mg/dL
Hgb A1c MFr Bld: 10.8 % — ABNORMAL HIGH (ref 4.8–5.6)

## 2021-09-01 ENCOUNTER — Other Ambulatory Visit: Payer: Self-pay | Admitting: Physician Assistant

## 2021-09-01 DIAGNOSIS — E1140 Type 2 diabetes mellitus with diabetic neuropathy, unspecified: Secondary | ICD-10-CM

## 2021-09-01 DIAGNOSIS — Z794 Long term (current) use of insulin: Secondary | ICD-10-CM

## 2021-09-01 MED ORDER — GLYBURIDE 5 MG PO TABS: 5.0000 mg | ORAL_TABLET | Freq: Two times a day (BID) | ORAL | 1 refills | Status: DC

## 2021-09-04 ENCOUNTER — Encounter: Payer: Self-pay | Admitting: Physician Assistant

## 2021-09-04 NOTE — Progress Notes (Signed)
Please call patient/her Husband to offer a referral to a Nephrologist for further evaluation of kidneys and a referral to Endocrinology for further evaluation of diabetes. Both are worse.   Thanks.

## 2021-09-07 ENCOUNTER — Encounter: Payer: Self-pay | Admitting: Physician Assistant

## 2021-09-08 ENCOUNTER — Other Ambulatory Visit: Payer: Self-pay | Admitting: Internal Medicine

## 2021-09-08 DIAGNOSIS — E1140 Type 2 diabetes mellitus with diabetic neuropathy, unspecified: Secondary | ICD-10-CM

## 2021-09-08 DIAGNOSIS — N1832 Chronic kidney disease, stage 3b: Secondary | ICD-10-CM

## 2021-09-08 NOTE — Progress Notes (Unsigned)
nep 

## 2021-09-12 NOTE — Telephone Encounter (Signed)
Thank you :)

## 2021-09-26 ENCOUNTER — Other Ambulatory Visit: Payer: Self-pay | Admitting: Nephrology

## 2021-09-26 ENCOUNTER — Other Ambulatory Visit (HOSPITAL_COMMUNITY): Payer: Self-pay | Admitting: Nephrology

## 2021-09-26 DIAGNOSIS — E1122 Type 2 diabetes mellitus with diabetic chronic kidney disease: Secondary | ICD-10-CM

## 2021-09-26 DIAGNOSIS — N189 Chronic kidney disease, unspecified: Secondary | ICD-10-CM

## 2021-09-26 DIAGNOSIS — N184 Chronic kidney disease, stage 4 (severe): Secondary | ICD-10-CM

## 2021-09-26 DIAGNOSIS — I129 Hypertensive chronic kidney disease with stage 1 through stage 4 chronic kidney disease, or unspecified chronic kidney disease: Secondary | ICD-10-CM

## 2021-09-26 DIAGNOSIS — N2581 Secondary hyperparathyroidism of renal origin: Secondary | ICD-10-CM

## 2021-10-10 ENCOUNTER — Other Ambulatory Visit: Payer: Self-pay

## 2021-10-10 ENCOUNTER — Ambulatory Visit (HOSPITAL_COMMUNITY)
Admission: RE | Admit: 2021-10-10 | Discharge: 2021-10-10 | Disposition: A | Discharge status: Home / Self Care | Payer: Medicare Other | Source: Ambulatory Visit | Attending: Nephrology | Admitting: Nephrology

## 2021-10-10 DIAGNOSIS — D631 Anemia in chronic kidney disease: Secondary | ICD-10-CM | POA: Insufficient documentation

## 2021-10-10 DIAGNOSIS — N184 Chronic kidney disease, stage 4 (severe): Secondary | ICD-10-CM | POA: Insufficient documentation

## 2021-10-10 DIAGNOSIS — N2581 Secondary hyperparathyroidism of renal origin: Secondary | ICD-10-CM | POA: Insufficient documentation

## 2021-10-10 DIAGNOSIS — I129 Hypertensive chronic kidney disease with stage 1 through stage 4 chronic kidney disease, or unspecified chronic kidney disease: Secondary | ICD-10-CM | POA: Diagnosis present

## 2021-10-10 DIAGNOSIS — N189 Chronic kidney disease, unspecified: Secondary | ICD-10-CM | POA: Diagnosis present

## 2021-10-10 DIAGNOSIS — E1122 Type 2 diabetes mellitus with diabetic chronic kidney disease: Secondary | ICD-10-CM | POA: Insufficient documentation

## 2021-10-16 ENCOUNTER — Other Ambulatory Visit: Payer: Self-pay

## 2021-10-16 MED ORDER — FUROSEMIDE 20 MG PO TABS: 20.0000 mg | ORAL_TABLET | Freq: Every day | ORAL | 1 refills | Status: DC | PRN

## 2021-10-27 ENCOUNTER — Other Ambulatory Visit: Payer: Self-pay

## 2021-10-27 ENCOUNTER — Telehealth: Payer: Self-pay | Admitting: Physician Assistant

## 2021-10-27 MED ORDER — FUROSEMIDE 20 MG PO TABS
20.0000 mg | ORAL_TABLET | Freq: Every day | ORAL | 0 refills | Status: DC | PRN
Start: 2021-10-27 — End: 2022-02-27

## 2021-10-27 NOTE — Telephone Encounter (Signed)
Fax refill request ? ?Furosemide 20 mg ?#30 last filled  04/13/2021 ? ? ? ?

## 2021-12-05 ENCOUNTER — Ambulatory Visit: Payer: Medicare Other | Admitting: Physician Assistant

## 2021-12-16 ENCOUNTER — Ambulatory Visit: Payer: Medicare Other | Admitting: Physician Assistant

## 2021-12-28 ENCOUNTER — Other Ambulatory Visit: Payer: Self-pay | Admitting: Physician Assistant

## 2021-12-28 DIAGNOSIS — F418 Other specified anxiety disorders: Secondary | ICD-10-CM

## 2021-12-29 ENCOUNTER — Other Ambulatory Visit: Payer: Self-pay | Admitting: Physician Assistant

## 2021-12-29 DIAGNOSIS — F418 Other specified anxiety disorders: Secondary | ICD-10-CM

## 2021-12-29 MED ORDER — CITALOPRAM HYDROBROMIDE 10 MG PO TABS: 10.0000 mg | ORAL_TABLET | Freq: Every day | ORAL | 1 refills | Status: DC

## 2021-12-29 NOTE — Telephone Encounter (Signed)
Is this okay to refill? 

## 2021-12-31 ENCOUNTER — Ambulatory Visit
Admission: RE | Admit: 2021-12-31 | Discharge: 2021-12-31 | Disposition: A | Discharge status: Home / Self Care | Payer: Medicare Other | Source: Ambulatory Visit | Attending: Family Medicine | Admitting: Family Medicine

## 2021-12-31 DIAGNOSIS — Z1231 Encounter for screening mammogram for malignant neoplasm of breast: Secondary | ICD-10-CM

## 2022-01-02 ENCOUNTER — Other Ambulatory Visit: Payer: Self-pay | Admitting: Family Medicine

## 2022-01-02 DIAGNOSIS — R928 Other abnormal and inconclusive findings on diagnostic imaging of breast: Secondary | ICD-10-CM

## 2022-01-09 ENCOUNTER — Ambulatory Visit
Admission: RE | Admit: 2022-01-09 | Discharge: 2022-01-09 | Disposition: A | Discharge status: Home / Self Care | Payer: Medicare Other | Source: Ambulatory Visit | Attending: Family Medicine | Admitting: Family Medicine

## 2022-01-09 DIAGNOSIS — R928 Other abnormal and inconclusive findings on diagnostic imaging of breast: Secondary | ICD-10-CM

## 2022-01-23 ENCOUNTER — Encounter: Payer: Self-pay | Admitting: Internal Medicine

## 2022-01-29 NOTE — Progress Notes (Signed)
Kelly Gibson is a 61 y.o. female who presents for annual wellness visit with her Husband, and follow-up on chronic medical conditions. She is seated on her motorized scooter for the exam today.  She has the following concerns:  Reports she is generally feeling well; is eating a diabetic diet; is sleeping well; is not exercising   Immunizations and Health Maintenance Immunization History  Administered Date(s) Administered   Influenza,inj,Quad PF,6+ Mos 06/16/2013, 04/11/2019, 05/22/2020   Influenza-Unspecified 04/21/2021   Janssen (J&J) SARS-COV-2 Vaccination 09/25/2019   Moderna Covid-19 Vaccine Bivalent Booster 13yrs & up 04/21/2021   Pneumococcal Polysaccharide-23 02/18/2015   Zoster Recombinat (Shingrix) 08/03/2021   Health Maintenance  Topic Date Due   OPHTHALMOLOGY EXAM  Never done   TETANUS/TDAP  Never done   COLONOSCOPY (Pts 45-7yrs Insurance coverage will need to be confirmed)  Never done   URINE MICROALBUMIN  12/26/2016   FOOT EXAM  04/10/2020   Zoster Vaccines- Shingrix (2 of 2) 09/28/2021   PAP SMEAR-Modifier  01/30/2022 (Originally 06/22/1982)   INFLUENZA VACCINE  02/24/2022   HEMOGLOBIN A1C  02/26/2022   MAMMOGRAM  01/01/2024   COVID-19 Vaccine  Completed   Hepatitis C Screening  Completed   HIV Screening  Completed   HPV VACCINES  Aged Out     The 10-year ASCVD risk score (Arnett DK, et al., 2019) is: 6.4%  Dentist: [facility] one a year Ophtho: Needs one Exercise: light walking  Patient Care Team: Marcellina Millin as PCP - General (Physician Assistant) Pixie Casino, MD as PCP - Cardiology (Cardiology) Pcp, [place] [facility], [place]   Advanced directives: Does Patient Have a Medical Advance Directive?: No  Depression screen:  See questionnaire below.     08/29/2021    2:51 PM 04/19/2021   10:54 AM 01/06/2021   11:36 AM 11/04/2020    1:48 PM 05/31/2020    3:24 PM  Depression screen PHQ 2/9  Decreased Interest 0 0  3 0  Down, Depressed,  Hopeless 1 1 0 3 0  PHQ - 2 Score 1 1 0 6 0  Altered sleeping 0   3   Tired, decreased energy 0   3   Change in appetite 0   0   Feeling bad or failure about yourself  0   3   Trouble concentrating 0   3   Moving slowly or fidgety/restless 0   0   Suicidal thoughts 1   3   PHQ-9 Score 2   21   Difficult doing work/chores Somewhat difficult   Somewhat difficult     Fall Risk Screen: see questionnaire below.    01/30/2022    2:44 PM 04/19/2021   10:57 AM 01/06/2021   11:36 AM 11/04/2020    1:48 PM 05/31/2020    3:24 PM  Fall Risk   Falls in the past year? 1 1 0 0 0  Number falls in past yr: 0 0 0 0   Injury with Fall? 0 0 0 0   Risk for fall due to : Impaired balance/gait;Impaired mobility Impaired mobility No Fall Risks No Fall Risks Impaired balance/gait  Follow up Falls evaluation completed  Falls evaluation completed Falls evaluation completed     ADL screen:  See questionnaire below Functional Status Survey: Is the patient deaf or have difficulty hearing?: No Does the patient have difficulty seeing, even when wearing glasses/contacts?: No Does the patient have difficulty concentrating, remembering, or making decisions?: Yes Does the patient have difficulty  walking or climbing stairs?: Yes (Pt unable to walk) Does the patient have difficulty dressing or bathing?: Yes (Husband helps) Does the patient have difficulty doing errands alone such as visiting a doctor's office or shopping?: No   Review of Systems  Constitutional:  Negative for activity change and fever.  HENT:  Negative for congestion, ear pain and voice change.   Eyes:  Negative for redness.  Respiratory:  Negative for cough.   Cardiovascular:  Negative for chest pain.  Gastrointestinal:  Negative for constipation and diarrhea.  Endocrine: Negative for polyuria.  Genitourinary:  Negative for flank pain.  Musculoskeletal:  Negative for gait problem and neck stiffness.  Skin:  Negative for color change and rash.   Neurological:  Positive for weakness. Negative for dizziness.  Hematological:  Negative for adenopathy.  Psychiatric/Behavioral:  Negative for agitation, behavioral problems and confusion.     BP 110/80   Pulse (!) 48   LMP 12/10/2012 (Exact Date)   SpO2 97%   Physical Exam Vitals and nursing note reviewed.  Constitutional:      General: She is not in acute distress.    Appearance: Normal appearance. She is not ill-appearing.  HENT:     Head: Normocephalic and atraumatic.     Right Ear: Tympanic membrane, ear canal and external ear normal.     Left Ear: Tympanic membrane, ear canal and external ear normal.     Nose: No congestion.  Eyes:     Extraocular Movements: Extraocular movements intact.     Conjunctiva/sclera: Conjunctivae normal.     Pupils: Pupils are equal, round, and reactive to light.  Neck:     Vascular: No carotid bruit.  Cardiovascular:     Rate and Rhythm: Normal rate and regular rhythm.     Pulses: Normal pulses.     Heart sounds: Normal heart sounds.  Pulmonary:     Effort: Pulmonary effort is normal.     Breath sounds: Normal breath sounds. No wheezing.  Abdominal:     General: Bowel sounds are normal.     Palpations: Abdomen is soft.  Musculoskeletal:     Right shoulder: Decreased range of motion.     Right elbow: Decreased range of motion.     Right wrist: Decreased range of motion.     Right hand: Decreased range of motion. Normal pulse.     Left hand: Normal. Normal pulse.     Cervical back: Normal range of motion and neck supple.     Right lower leg: No edema.     Left lower leg: No edema.     Right foot: Decreased range of motion. Normal pulse.  Skin:    General: Skin is warm and dry.     Findings: No bruising.  Neurological:     General: No focal deficit present.     Mental Status: She is alert and oriented to person, place, and time.  Psychiatric:        Mood and Affect: Mood normal.        Behavior: Behavior normal.        Thought  Content: Thought content normal.     ASSESSMENT/PLAN  Problem List Items Addressed This Visit       Cardiovascular and Mediastinum   Hypertension associated with diabetes ([place])    eat a low salt diet, do not add any salt to food when cooking, avoid processed foods, avoid fried foods       Relevant Medications   insulin degludec (  TRESIBA FLEXTOUCH) 100 UNIT/ML FlexTouch Pen   TRESIBA FLEXTOUCH 100 UNIT/ML FlexTouch Pen     Respiratory   Chronic obstructive pulmonary disease (HCC)    Stable, continue current management      Relevant Medications   umeclidinium bromide (INCRUSE ELLIPTA) 62.5 MCG/ACT AEPB     Endocrine   Type 2 diabetes mellitus with diabetic neuropathy, with long-term current use of insulin ([place])    controlled, eat a low sugar diet, avoid starchy food with a lot of carbohydrates, avoid fried and processed foods; last hgb a1c 10.8 on 08/29/2021       Relevant Medications   insulin degludec (TRESIBA FLEXTOUCH) 100 UNIT/ML FlexTouch Pen   TRESIBA FLEXTOUCH 100 UNIT/ML FlexTouch Pen   Lancets 28G MISC   Other Relevant Orders   Hemoglobin A1c   Comprehensive metabolic panel     Nervous and Auditory   Right spastic hemiplegia (HCC)    S/p stroke, stable        Other   Mixed hyperlipidemia     eat a low fat diet, increase fiber intake (Benefiber or Metamucil, Cherrios,  oatmeal, beans, nuts, fruits and vegetables), limit saturated fats (in fried foods, red meat), can add OTC fish oil supplement, eat fish with Omega-3 fatty acids like salmon and tuna, exercise for 30 minutes 3 - 5 times a week, drink 8 - 10 glasses of water a day        Relevant Orders   Lipid panel   History of CVA (cerebrovascular accident)    Stable, continue current management      Other Visit Diagnoses     Medicare annual wellness visit, subsequent    -  Primary   Relevant Orders   Lipid panel   Hemoglobin A1c   Comprehensive metabolic panel       Discussed monthly self  breast exams and yearly mammograms; at least 30 minutes of aerobic activity at least 5 days/week and weight-bearing exercise 2x/week; proper sunscreen use reviewed; healthy diet, including goals of calcium and vitamin D intake and alcohol recommendations (less than or equal to 1 drink/day) reviewed; regular seatbelt use; changing batteries in smoke detectors.  Immunization recommendations discussed.  Colonoscopy recommendations reviewed   Medicare Attestation I have personally reviewed: The patient's medical and social history Their use of alcohol, tobacco or illicit drugs Their current medications and supplements The patient's functional ability including ADLs,fall risks, home safety risks, cognitive, and hearing and visual impairment Diet and physical activities Evidence for depression or mood disorders  The patient's weight, height, and BMI have been recorded in the chart.  I have made referrals, counseling, and provided education to the patient based on review of the above and I have provided the patient with a written personalized care plan for preventive services.    Return in about 6 months (around 08/02/2022) for Return for a Follow Up Appointment Chronic Disease.    Irene Pap, PA-C   01/30/2022

## 2022-01-30 ENCOUNTER — Ambulatory Visit (INDEPENDENT_AMBULATORY_CARE_PROVIDER_SITE_OTHER): Payer: Medicare Other | Admitting: Physician Assistant

## 2022-01-30 ENCOUNTER — Encounter: Payer: Self-pay | Admitting: Physician Assistant

## 2022-01-30 VITALS — BP 110/80 | HR 48 | Ht 63.0 in

## 2022-01-30 DIAGNOSIS — I152 Hypertension secondary to endocrine disorders: Secondary | ICD-10-CM

## 2022-01-30 DIAGNOSIS — J410 Simple chronic bronchitis: Secondary | ICD-10-CM

## 2022-01-30 DIAGNOSIS — Z0000 Encounter for general adult medical examination without abnormal findings: Secondary | ICD-10-CM

## 2022-01-30 DIAGNOSIS — E1140 Type 2 diabetes mellitus with diabetic neuropathy, unspecified: Secondary | ICD-10-CM

## 2022-01-30 DIAGNOSIS — G8111 Spastic hemiplegia affecting right dominant side: Secondary | ICD-10-CM

## 2022-01-30 DIAGNOSIS — Z794 Long term (current) use of insulin: Secondary | ICD-10-CM

## 2022-01-30 DIAGNOSIS — E782 Mixed hyperlipidemia: Secondary | ICD-10-CM

## 2022-01-30 DIAGNOSIS — Z8673 Personal history of transient ischemic attack (TIA), and cerebral infarction without residual deficits: Secondary | ICD-10-CM

## 2022-01-30 DIAGNOSIS — E1159 Type 2 diabetes mellitus with other circulatory complications: Secondary | ICD-10-CM | POA: Diagnosis not present

## 2022-01-30 MED ORDER — TIZANIDINE HCL 4 MG PO TABS: 4.0000 mg | ORAL_TABLET | Freq: Two times a day (BID) | ORAL | 2 refills | Status: DC

## 2022-01-30 MED ORDER — UMECLIDINIUM BROMIDE 62.5 MCG/ACT IN AEPB: 1.0000 | INHALATION_SPRAY | Freq: Every day | RESPIRATORY_TRACT | 2 refills | Status: DC

## 2022-01-30 MED ORDER — LANCETS 28G MISC: 1.0000 [IU] | Freq: Two times a day (BID) | 3 refills | Status: DC

## 2022-01-30 NOTE — Assessment & Plan Note (Signed)
>>  ASSESSMENT AND PLAN FOR TYPE 2 DIABETES MELLITUS WITH DIABETIC NEUROPATHY, WITH LONG-TERM CURRENT USE OF INSULIN  (HCC) WRITTEN ON 01/30/2022  3:40 PM BY TRUDY, LYNNE B, PA-C  controlled, eat a low sugar diet, avoid starchy food with a lot of carbohydrates, avoid fried and processed foods; last hgb a1c 10.8 on 08/29/2021

## 2022-01-30 NOTE — Patient Instructions (Signed)
.Preventative Care for Adults - Female      MAINTAIN REGULAR HEALTH EXAMS: A routine yearly physical is a good way to check in with your primary care provider about your health and preventive screening. It is also an opportunity to share updates about your health and any concerns you have, and receive a thorough all-over exam.  Most health insurance companies pay for at least some preventative services.  Check with your health plan for specific coverages.  WHAT PREVENTATIVE SERVICES DO WOMEN NEED? Adult women should have their weight and blood pressure checked regularly.  Women age 35 and older should have their cholesterol levels checked regularly. Women should be screened for cervical cancer with a Pap smear and pelvic exam beginning at either age 21, or 3 years after they become sexually activity.   Breast cancer screening generally begins at age 40 with a mammogram and breast exam by your primary care provider.   Beginning at age 45 and continuing to age 75, women should be screened for colorectal cancer.  Certain people may need continued testing until age 85. Updating vaccinations is part of preventative care.  Vaccinations help protect against diseases such as the flu. Osteoporosis is a disease in which the bones lose minerals and strength as we age. Women ages 65 and over should discuss this with their caregivers, as should women after menopause who have other risk factors. Lab tests are generally done as part of preventative care to screen for anemia and blood disorders, to screen for problems with the kidneys and liver, to screen for bladder problems, to check blood sugar, and to check your cholesterol level. Preventative services generally include counseling about diet, exercise, avoiding tobacco, drugs, excessive alcohol consumption, and sexually transmitted infections.    GENERAL RECOMMENDATIONS FOR GOOD HEALTH:  Healthy diet: Eat a variety of foods, including fruit, vegetables,  animal or vegetable protein, such as meat, fish, chicken, and eggs, or beans, lentils, tofu, and grains, such as rice. Drink plenty of water daily (60 - 80 ounces or 8 - 10 glasses a day) Decrease saturated fat in the diet, avoid lots of red meat, processed foods, sweets, fast foods, and fried foods. For high cholesterol - Increase fiber intake (Benefiber or Metamucil, Cherrios,  oatmeal, beans, nuts, fruits and vegetables), limit saturated fats (in fried foods, red meat), can add OTC fish oil supplement, eat fish with Omega-3 fatty acids like salmon and tuna, exercise for 30 minutes 3 - 5 times a week, drink 8 - 10 glasses of water a day  Exercise: Aerobic exercise helps maintain good heart health. At least 30-40 minutes of moderate-intensity exercise is recommended. For example, a brisk walk that increases your heart rate and breathing. This should be done on most days of the week.  Find a type of exercise or a variety of exercises that you enjoy so that it becomes a part of your daily life.  Examples are running, walking, swimming, water aerobics, and biking.  For motivation and support, explore group exercise such as aerobic class, spin class, Zumba, Yoga,or  martial arts, etc.   Set exercise goals for yourself, such as a certain weight goal, walk or run in a race such as a 5k walk/run.  Speak to your primary care provider about exercise goals.  Disease prevention: If you smoke or chew tobacco, find out from your caregiver how to quit. It can literally save your life, no matter how long you have been a tobacco user. If you do not   use tobacco, never begin.  Maintain a healthy diet and normal weight. Increased weight leads to problems with blood pressure and diabetes.  The Body Mass Index or BMI is a way of measuring how much of your body is fat. Having a BMI above 27 increases the risk of heart disease, diabetes, hypertension, stroke and other problems related to obesity. Your caregiver can help  determine your BMI and based on it develop an exercise and dietary program to help you achieve or maintain this important measurement at a healthful level. High blood pressure causes heart and blood vessel problems.  Persistent high blood pressure should be treated with medicine if weight loss and exercise do not work.  Fat and cholesterol leaves deposits in your arteries that can block them. This causes heart disease and vessel disease elsewhere in your body.  If your cholesterol is found to be high, or if you have heart disease or certain other medical conditions, then you may need to have your cholesterol monitored frequently and be treated with medication.  Ask if you should have a cardiac stress test if your history suggests this. A stress test is a test done on a treadmill that looks for heart disease. This test can find disease prior to there being a problem. Menopause can be associated with physical symptoms and risks. Hormone replacement therapy is available to decrease these. You should talk to your caregiver about whether starting or continuing to take hormones is right for you.  Osteoporosis is a disease in which the bones lose minerals and strength as we age. This can result in serious bone fractures. Risk of osteoporosis can be identified using a bone density scan. Women ages 65 and over should discuss this with their caregivers, as should women after menopause who have other risk factors. Ask your caregiver whether you should be taking a calcium supplement and Vitamin D, to reduce the rate of osteoporosis.  Avoid drinking alcohol in excess (more than two drinks per day).  Avoid use of street drugs. Do not share needles with anyone. Ask for professional help if you need assistance or instructions on stopping the use of alcohol, cigarettes, and/or drugs. Brush your teeth twice a day with fluoride toothpaste, and floss once a day. Good oral hygiene prevents tooth decay and gum disease. The  problems can be painful, unattractive, and can cause other health problems. Visit your dentist for a routine oral and dental check up and preventive care every 6-12 months.  Look at your skin regularly.  Use a mirror to look at your back. Notify your caregivers of changes in moles, especially if there are changes in shapes, colors, a size larger than a pencil eraser, an irregular border, or development of new moles.  Safety: Use seatbelts 100% of the time, whether driving or as a passenger.  Use safety devices such as hearing protection if you work in environments with loud noise or significant background noise.  Use safety glasses when doing any work that could send debris in to the eyes.  Use a helmet if you ride a bike or motorcycle.  Use appropriate safety gear for contact sports.  Talk to your caregiver about gun safety. Use sunscreen with a SPF (or skin protection factor) of 15 or greater.  Lighter skinned people are at a greater risk of skin cancer. Don't forget to also wear sunglasses in order to protect your eyes from too much damaging sunlight. Damaging sunlight can accelerate cataract formation.  Practice safe sex. Use   condoms. Condoms are used for birth control and to help reduce the spread of sexually transmitted infections (or STIs).  Some of the STIs are gonorrhea (the clap), chlamydia, syphilis, trichomonas, herpes, HPV (human papilloma virus) and HIV (human immunodeficiency virus) which causes AIDS. The herpes, HIV and HPV are viral illnesses that have no cure. These can result in disability, cancer and death.  Keep carbon monoxide and smoke detectors in your home functioning at all times. Change the batteries every 6 months or use a model that plugs into the wall.   Vaccinations: Stay up to date with your tetanus shots and other required immunizations. You should have a booster for tetanus every 10 years. Be sure to get your flu shot every year, since 5%-20% of the [place] population comes  down with the flu. The flu vaccine changes each year, so being vaccinated once is not enough. Get your shot in the fall, before the flu season peaks.   Other vaccines to consider: Human Papilloma Virus or HPV causes cancer of the cervix, and other infections that can be transmitted from person to person. There is a vaccine for HPV, and females should get immunized between the ages of 11 and 26. It requires a series of 3 shots.  Pneumococcal vaccine to protect against certain types of pneumonia.  This is normally recommended for adults age 65 or older.  However, adults younger than 60 years old with certain underlying conditions such as diabetes, heart or lung disease should also receive the vaccine. Shingles vaccine to protect against Varicella Zoster if you are older than age 60, or younger than 60 years old with certain underlying illness. Hepatitis A vaccine to protect against a form of infection of the liver by a virus acquired from food. Hepatitis B vaccine to protect against a form of infection of the liver by a virus acquired from blood or body fluids, particularly if you work in health care. If you plan to travel internationally, check with your local health department for specific vaccination recommendations.  Cancer Screening: Breast cancer screening is essential to preventive care for women. All women age 20 and older should perform a breast self-exam every month. At age 40 and older, women should have their caregiver complete a breast exam each year. Women at ages 40 and older should have a mammogram (x-ray film) of the breasts. Your caregiver can discuss how often you need mammograms.   Cervical cancer screening includes taking a Pap smear (sample of cells examined under a microscope) from the cervix (end of the uterus). It also includes testing for HPV (Human Papilloma Virus, which can cause cervical cancer). Screening and a pelvic exam should begin at age 21, or 3 years after a woman  becomes sexually active. Screening should occur every year, with a Pap smear but no HPV testing, up to age 30. After age 30, you should have a Pap smear every 3 years with HPV testing, if no HPV was found previously.  Most routine colon cancer screening begins at the age of 50. On a yearly basis, doctors may provide special easy to use take-home tests to check for hidden blood in the stool. Sigmoidoscopy or colonoscopy can detect the earliest forms of colon cancer and is life saving. These tests use a small camera at the end of a tube to directly examine the colon. Speak to your caregiver about this at age 50, when routine screening begins (and is repeated every 5 years unless early forms of pre-cancerous polyps   or small growths are found).    

## 2022-01-30 NOTE — Assessment & Plan Note (Addendum)
>>  ASSESSMENT AND PLAN FOR HEMIPARESIS AFFECTING RIGHT SIDE AS LATE EFFECT OF CEREBROVASCULAR ACCIDENT (CVA) (HCC) WRITTEN ON 10/06/2022  7:48 PM BY Louvina Cleary E, NP  >>ASSESSMENT AND PLAN FOR RIGHT SPASTIC HEMIPLEGIA (HCC) WRITTEN ON 01/30/2022  3:40 PM BY WILLIAMS, LYNNE B, PA-C  S/p stroke, stable  >>ASSESSMENT AND PLAN FOR HISTORY OF CVA (CEREBROVASCULAR ACCIDENT) WRITTEN ON 01/30/2022  3:41 PM BY Mayford Knife, LYNNE B, PA-C  Stable, continue current management

## 2022-01-30 NOTE — Assessment & Plan Note (Signed)
Stable, continue current management 

## 2022-01-30 NOTE — Assessment & Plan Note (Signed)
Controlled, eat a low salt diet, do not add any salt to food when cooking, avoid processed foods, avoid fried foods

## 2022-01-30 NOTE — Assessment & Plan Note (Signed)
controlled, eat a low sugar diet, avoid starchy food with a lot of carbohydrates, avoid fried and processed foods; last hgb a1c 10.8 on 08/29/2021

## 2022-01-30 NOTE — Assessment & Plan Note (Signed)
S/p stroke, stable

## 2022-01-30 NOTE — Assessment & Plan Note (Signed)
eat a low fat diet, increase fiber intake (Benefiber or Metamucil, Cherrios,  oatmeal, beans, nuts, fruits and vegetables), limit saturated fats (in fried foods, red meat), can add OTC fish oil supplement, eat fish with Omega-3 fatty acids like salmon and tuna, exercise for 30 minutes 3 - 5 times a week, drink 8 - 10 glasses of water a day   

## 2022-01-30 NOTE — Assessment & Plan Note (Addendum)
>>  ASSESSMENT AND PLAN FOR HYPERTENSION ASSOCIATED WITH DIABETES ([place]) WRITTEN ON 01/30/2022  3:41 PM BY Jimmye Norman, Sangita Zani B, PA-C  eat a low salt diet, do not add any salt to food when cooking, avoid processed foods, avoid fried foods   >>ASSESSMENT AND PLAN FOR ESSENTIAL HYPERTENSION, BENIGN WRITTEN ON 01/30/2022  3:38 PM BY Jimmye Norman, Alexzandra Bilton B, PA-C  Controlled, eat a low salt diet, do not add any salt to food when cooking, avoid processed foods, avoid fried foods

## 2022-01-31 LAB — COMPREHENSIVE METABOLIC PANEL
ALT: 15 IU/L (ref 0–32)
AST: 21 IU/L (ref 0–40)
Albumin/Globulin Ratio: 1.3 (ref 1.2–2.2)
Albumin: 4 g/dL (ref 3.8–4.9)
Alkaline Phosphatase: 85 IU/L (ref 44–121)
BUN/Creatinine Ratio: 15 (ref 9–23)
BUN: 22 mg/dL (ref 6–24)
Bilirubin Total: 0.2 mg/dL (ref 0.0–1.2)
CO2: 26 mmol/L (ref 20–29)
Calcium: 9.7 mg/dL (ref 8.7–10.2)
Chloride: 101 mmol/L (ref 96–106)
Creatinine, Ser: 1.45 mg/dL — ABNORMAL HIGH (ref 0.57–1.00)
Globulin, Total: 3.2 g/dL (ref 1.5–4.5)
Glucose: 148 mg/dL — ABNORMAL HIGH (ref 70–99)
Potassium: 4.6 mmol/L (ref 3.5–5.2)
Sodium: 144 mmol/L (ref 134–144)
Total Protein: 7.2 g/dL (ref 6.0–8.5)
eGFR: 42 mL/min/{1.73_m2} — ABNORMAL LOW (ref >59)

## 2022-01-31 LAB — LIPID PANEL
Chol/HDL Ratio: 2.9 ratio (ref 0.0–4.4)
Cholesterol, Total: 143 mg/dL (ref 100–199)
HDL: 49 mg/dL (ref >39)
LDL Chol Calc (NIH): 59 mg/dL (ref 0–99)
Triglycerides: 214 mg/dL — ABNORMAL HIGH (ref 0–149)
VLDL Cholesterol Cal: 35 mg/dL (ref 5–40)

## 2022-01-31 LAB — HEMOGLOBIN A1C
Est. average glucose Bld gHb Est-mCnc: 203 mg/dL
Hgb A1c MFr Bld: 8.7 % — ABNORMAL HIGH (ref 4.8–5.6)

## 2022-02-11 NOTE — Progress Notes (Deleted)
No chief complaint on file.   Patient presents for med check--to follow up on recent lab tests done at wellness visit with Sula Soda. See labs below.   A1c was elevated at 8.7% and TG was elevated at 214.  Diabetes: Apparently she is under the care of Dr. Chalmers Cater (this was not documented in her AWV, no endocrinologist listed with doctors). Last visit was in 11/2021.  Looks like she was prescribed Dexcom monitor, and Tresiba 12 U. She is due for f/u and labs in mid-August. Patient is currently taking [place]  DOSE?  Apparently at 08/2021 visit husband requested glyburide--this wasn't covered, glipizide was.  Never switched?  Sugars are running  She checks her feet regularly without concerns. Hasn't had an eye exam  Denies polydipsia, polyuria, hypoglycemia  Lab Results  Component Value Date   HGBA1C 8.7 (H) 01/30/2022    Hypertension- reports taking amlodipine 10 mg daily.  She has Lasix to use prn, not needed recently. According to notes from Dr. Posey Pronto (11/2021), his plan was to decrease the amlodipine (due to edema), and add ARB ?? Due to f/u with Dr. Posey Pronto in [facility]??  BP Readings from Last 3 Encounters:  01/30/22 110/80  08/29/21 138/70  01/06/21 130/80   Hyperlipidemia:  She is compliant with taking rosuvastatin 40mg  daily and denies side effects.  ?fish oil Diet?   COPD-uses maintenance inhaler daily and has not needed albuterol.  Denies difficulty breathing.  History of CVA with right-sided paralysis, aphasia and unsteady gait.      Chemistry      Component Value Date/Time   NA 144 01/30/2022 1509   K 4.6 01/30/2022 1509   CL 101 01/30/2022 1509   CO2 26 01/30/2022 1509   BUN 22 01/30/2022 1509   CREATININE 1.45 (H) 01/30/2022 1509   CREATININE 1.12 (H) 05/22/2020 1211      Component Value Date/Time   CALCIUM 9.7 01/30/2022 1509   ALKPHOS 85 01/30/2022 1509   AST 21 01/30/2022 1509   ALT 15 01/30/2022 1509   BILITOT 0.2 01/30/2022 1509      Glu 148  Lab Results  Component Value Date   HGBA1C 8.7 (H) 01/30/2022    Lab Results  Component Value Date   CHOL 143 01/30/2022   HDL 49 01/30/2022   LDLCALC 59 01/30/2022   LDLDIRECT 217 (H) 12/13/2015   TRIG 214 (H) 01/30/2022   CHOLHDL 2.9 01/30/2022     ASSESSMENT/PLAN:  How many units of Tresiba taking?  Document in meds Did she ever get glyburide or glipizide?  D/c from med list if not taking Apparently she is under the care of Dr. Chalmers Cater for her diabetes, and is due to f/u there in August. This was NOT listed in her list of doctors at her recent AWV. If this was listed, I likely wouldn't have needed to see her to f/u on her labs, would have just forwarded the labs and told her to f/u.  Per nephro note in May, was going to cut back on amlodipine and add ARB.  Confirm meds being taken Taking vitamin D3 5000 IU ? (Was advised to by nephro).  SEND LABS TO DR Chalmers Cater  Did she schedule diabetic eye exam? (Hopefully advised to at her recent AWV). If not scheduled--remind her to, and suggest eye doctors if she needs a new one.   ??Sabrina scheduled her for 07/2022 med check with me.  Not accepting as new pt--this may need to be changed when we have a new  provider, shouldn't be scheduling 6 mos out.  Needs amlodipine and crestor refilled.   Your diabetes is not well controlled. Since you are under the care of an endocrinologist (Dr. Chalmers Cater), you should discuss your medications and sugars with her.  You are due to see her next month.  We will forward your recent lab results to her.  You are also due to follow-up with Dr. Posey Pronto (kidney doctor), in September.  Please schedule diabetic eye exam.

## 2022-02-12 ENCOUNTER — Telehealth: Payer: Self-pay | Admitting: Family Medicine

## 2022-02-12 ENCOUNTER — Ambulatory Visit: Payer: Medicare Other | Admitting: Family Medicine

## 2022-02-12 DIAGNOSIS — E1165 Type 2 diabetes mellitus with hyperglycemia: Secondary | ICD-10-CM

## 2022-02-12 DIAGNOSIS — N1832 Chronic kidney disease, stage 3b: Secondary | ICD-10-CM

## 2022-02-12 DIAGNOSIS — I152 Hypertension secondary to endocrine disorders: Secondary | ICD-10-CM

## 2022-02-12 DIAGNOSIS — E782 Mixed hyperlipidemia: Secondary | ICD-10-CM

## 2022-02-12 NOTE — Telephone Encounter (Signed)
Dr Tomi Bamberger advised pt does not need to have her appt today.  She has an Endo that follows her blood sugars.  I will send copy of labs to Dr. Chalmers Cater.  Called pt reached voice mail, lmtrc.   We are cancelling todays appt and January's appt.  We should have another provider in at that time to care for Kelly Gibson's patients.

## 2022-02-19 ENCOUNTER — Other Ambulatory Visit: Payer: Self-pay | Admitting: Family Medicine

## 2022-02-19 DIAGNOSIS — I509 Heart failure, unspecified: Secondary | ICD-10-CM

## 2022-02-26 ENCOUNTER — Other Ambulatory Visit: Payer: Self-pay | Admitting: Physician Assistant

## 2022-02-27 NOTE — Telephone Encounter (Signed)
I never saw her. Looks like she sees cardiology for CHF.  Maybe they will refill

## 2022-03-20 ENCOUNTER — Other Ambulatory Visit: Payer: Self-pay

## 2022-03-26 ENCOUNTER — Other Ambulatory Visit: Payer: Self-pay | Admitting: Physician Assistant

## 2022-03-26 DIAGNOSIS — I10 Essential (primary) hypertension: Secondary | ICD-10-CM

## 2022-03-26 DIAGNOSIS — E782 Mixed hyperlipidemia: Secondary | ICD-10-CM

## 2022-03-27 ENCOUNTER — Other Ambulatory Visit: Payer: Self-pay

## 2022-04-01 ENCOUNTER — Encounter: Payer: Self-pay | Admitting: Internal Medicine

## 2022-04-06 DIAGNOSIS — J449 Chronic obstructive pulmonary disease, unspecified: Secondary | ICD-10-CM | POA: Diagnosis not present

## 2022-04-06 DIAGNOSIS — E559 Vitamin D deficiency, unspecified: Secondary | ICD-10-CM | POA: Diagnosis not present

## 2022-04-09 ENCOUNTER — Other Ambulatory Visit: Payer: Self-pay

## 2022-04-09 DIAGNOSIS — N1832 Chronic kidney disease, stage 3b: Secondary | ICD-10-CM | POA: Diagnosis not present

## 2022-04-09 DIAGNOSIS — D631 Anemia in chronic kidney disease: Secondary | ICD-10-CM | POA: Diagnosis not present

## 2022-04-09 DIAGNOSIS — N2581 Secondary hyperparathyroidism of renal origin: Secondary | ICD-10-CM | POA: Diagnosis not present

## 2022-04-09 DIAGNOSIS — I129 Hypertensive chronic kidney disease with stage 1 through stage 4 chronic kidney disease, or unspecified chronic kidney disease: Secondary | ICD-10-CM | POA: Diagnosis not present

## 2022-04-09 MED ORDER — FUROSEMIDE 20 MG PO TABS: 20.0000 mg | ORAL_TABLET | Freq: Every day | ORAL | 2 refills | Status: DC | PRN

## 2022-05-05 DIAGNOSIS — N189 Chronic kidney disease, unspecified: Secondary | ICD-10-CM | POA: Diagnosis not present

## 2022-05-05 DIAGNOSIS — E7800 Pure hypercholesterolemia, unspecified: Secondary | ICD-10-CM | POA: Diagnosis not present

## 2022-05-05 DIAGNOSIS — R300 Dysuria: Secondary | ICD-10-CM | POA: Diagnosis not present

## 2022-05-05 DIAGNOSIS — I10 Essential (primary) hypertension: Secondary | ICD-10-CM | POA: Diagnosis not present

## 2022-05-05 DIAGNOSIS — G459 Transient cerebral ischemic attack, unspecified: Secondary | ICD-10-CM | POA: Diagnosis not present

## 2022-05-05 DIAGNOSIS — E1165 Type 2 diabetes mellitus with hyperglycemia: Secondary | ICD-10-CM | POA: Diagnosis not present

## 2022-05-05 DIAGNOSIS — J440 Chronic obstructive pulmonary disease with acute lower respiratory infection: Secondary | ICD-10-CM | POA: Diagnosis not present

## 2022-05-05 DIAGNOSIS — I69353 Hemiplegia and hemiparesis following cerebral infarction affecting right non-dominant side: Secondary | ICD-10-CM | POA: Diagnosis not present

## 2022-06-09 ENCOUNTER — Encounter: Payer: Self-pay | Admitting: Internal Medicine

## 2022-06-24 DIAGNOSIS — H35033 Hypertensive retinopathy, bilateral: Secondary | ICD-10-CM | POA: Diagnosis not present

## 2022-06-24 DIAGNOSIS — E113293 Type 2 diabetes mellitus with mild nonproliferative diabetic retinopathy without macular edema, bilateral: Secondary | ICD-10-CM | POA: Diagnosis not present

## 2022-06-24 DIAGNOSIS — H5203 Hypermetropia, bilateral: Secondary | ICD-10-CM | POA: Diagnosis not present

## 2022-06-24 DIAGNOSIS — H52222 Regular astigmatism, left eye: Secondary | ICD-10-CM | POA: Diagnosis not present

## 2022-06-24 LAB — HM DIABETES EYE EXAM

## 2022-06-25 ENCOUNTER — Other Ambulatory Visit: Payer: Self-pay | Admitting: Physician Assistant

## 2022-06-25 DIAGNOSIS — F418 Other specified anxiety disorders: Secondary | ICD-10-CM

## 2022-07-06 DIAGNOSIS — I69353 Hemiplegia and hemiparesis following cerebral infarction affecting right non-dominant side: Secondary | ICD-10-CM | POA: Diagnosis not present

## 2022-07-06 DIAGNOSIS — E1165 Type 2 diabetes mellitus with hyperglycemia: Secondary | ICD-10-CM | POA: Diagnosis not present

## 2022-07-06 DIAGNOSIS — E7800 Pure hypercholesterolemia, unspecified: Secondary | ICD-10-CM | POA: Diagnosis not present

## 2022-07-06 DIAGNOSIS — I10 Essential (primary) hypertension: Secondary | ICD-10-CM | POA: Diagnosis not present

## 2022-07-06 DIAGNOSIS — Z23 Encounter for immunization: Secondary | ICD-10-CM | POA: Diagnosis not present

## 2022-07-17 ENCOUNTER — Telehealth: Payer: Self-pay | Admitting: Nurse Practitioner

## 2022-07-17 NOTE — Telephone Encounter (Signed)
After speaking to provider medicare requires a face to face appointment pt's husband advised. He declined

## 2022-07-17 NOTE — Telephone Encounter (Signed)
This pt's husband called and states that she uses a scooter due to mobility. He is getting her a new one for christmas. He went to [facility] was was told if he gets a RX for one he can save 30%. I told him I was unsure but I would send a message back to her new provider here. Rx can be fax to [facility] at [phone].

## 2022-07-17 NOTE — Telephone Encounter (Signed)
Medicare requires face to face visit for the specific diagnosis in order to send this type of order. We can schedule her to be seen for this.

## 2022-07-30 ENCOUNTER — Other Ambulatory Visit: Payer: Self-pay | Admitting: Physician Assistant

## 2022-07-31 NOTE — Telephone Encounter (Signed)
Refill request last apt 01/30/22.

## 2022-08-03 ENCOUNTER — Encounter: Payer: Medicare Other | Admitting: Family Medicine

## 2022-08-03 DIAGNOSIS — N1831 Chronic kidney disease, stage 3a: Secondary | ICD-10-CM | POA: Diagnosis not present

## 2022-08-03 DIAGNOSIS — I10 Essential (primary) hypertension: Secondary | ICD-10-CM | POA: Diagnosis not present

## 2022-08-03 DIAGNOSIS — I69353 Hemiplegia and hemiparesis following cerebral infarction affecting right non-dominant side: Secondary | ICD-10-CM | POA: Diagnosis not present

## 2022-08-03 DIAGNOSIS — E7800 Pure hypercholesterolemia, unspecified: Secondary | ICD-10-CM | POA: Diagnosis not present

## 2022-08-03 DIAGNOSIS — E1165 Type 2 diabetes mellitus with hyperglycemia: Secondary | ICD-10-CM | POA: Diagnosis not present

## 2022-08-20 DIAGNOSIS — D631 Anemia in chronic kidney disease: Secondary | ICD-10-CM | POA: Diagnosis not present

## 2022-08-20 DIAGNOSIS — N189 Chronic kidney disease, unspecified: Secondary | ICD-10-CM | POA: Diagnosis not present

## 2022-08-20 DIAGNOSIS — N1832 Chronic kidney disease, stage 3b: Secondary | ICD-10-CM | POA: Diagnosis not present

## 2022-08-20 DIAGNOSIS — I129 Hypertensive chronic kidney disease with stage 1 through stage 4 chronic kidney disease, or unspecified chronic kidney disease: Secondary | ICD-10-CM | POA: Diagnosis not present

## 2022-08-20 DIAGNOSIS — N2581 Secondary hyperparathyroidism of renal origin: Secondary | ICD-10-CM | POA: Diagnosis not present

## 2022-09-11 DIAGNOSIS — N1832 Chronic kidney disease, stage 3b: Secondary | ICD-10-CM | POA: Diagnosis not present

## 2022-09-28 ENCOUNTER — Other Ambulatory Visit: Payer: Self-pay | Admitting: Nurse Practitioner

## 2022-09-28 ENCOUNTER — Telehealth: Payer: Self-pay | Admitting: Nurse Practitioner

## 2022-09-28 ENCOUNTER — Other Ambulatory Visit: Payer: Self-pay

## 2022-09-28 DIAGNOSIS — I10 Essential (primary) hypertension: Secondary | ICD-10-CM

## 2022-09-28 DIAGNOSIS — E782 Mixed hyperlipidemia: Secondary | ICD-10-CM

## 2022-09-28 MED ORDER — AMLODIPINE BESYLATE 10 MG PO TABS: 10.0000 mg | ORAL_TABLET | Freq: Every day | ORAL | 0 refills | Status: DC

## 2022-09-28 MED ORDER — ROSUVASTATIN CALCIUM 40 MG PO TABS: ORAL_TABLET | ORAL | 0 refills | Status: DC

## 2022-09-28 NOTE — Telephone Encounter (Signed)
Pharmacy sent refill request for norvasc and the   crestor   And   please send to the [facility] [phone] - [place], [place]

## 2022-09-30 ENCOUNTER — Telehealth: Payer: Self-pay | Admitting: Nurse Practitioner

## 2022-09-30 NOTE — Telephone Encounter (Signed)
Pt will need an appt.

## 2022-10-05 ENCOUNTER — Other Ambulatory Visit: Payer: Self-pay

## 2022-10-05 ENCOUNTER — Ambulatory Visit (INDEPENDENT_AMBULATORY_CARE_PROVIDER_SITE_OTHER): Payer: Medicare Other | Admitting: Nurse Practitioner

## 2022-10-05 ENCOUNTER — Encounter: Payer: Self-pay | Admitting: Nurse Practitioner

## 2022-10-05 VITALS — BP 132/82 | HR 56

## 2022-10-05 DIAGNOSIS — E1159 Type 2 diabetes mellitus with other circulatory complications: Secondary | ICD-10-CM

## 2022-10-05 DIAGNOSIS — F418 Other specified anxiety disorders: Secondary | ICD-10-CM

## 2022-10-05 DIAGNOSIS — E1169 Type 2 diabetes mellitus with other specified complication: Secondary | ICD-10-CM | POA: Diagnosis not present

## 2022-10-05 DIAGNOSIS — I152 Hypertension secondary to endocrine disorders: Secondary | ICD-10-CM | POA: Diagnosis not present

## 2022-10-05 DIAGNOSIS — N1832 Chronic kidney disease, stage 3b: Secondary | ICD-10-CM | POA: Diagnosis not present

## 2022-10-05 DIAGNOSIS — E785 Hyperlipidemia, unspecified: Secondary | ICD-10-CM

## 2022-10-05 DIAGNOSIS — E1140 Type 2 diabetes mellitus with diabetic neuropathy, unspecified: Secondary | ICD-10-CM

## 2022-10-05 DIAGNOSIS — J410 Simple chronic bronchitis: Secondary | ICD-10-CM

## 2022-10-05 DIAGNOSIS — E782 Mixed hyperlipidemia: Secondary | ICD-10-CM | POA: Diagnosis not present

## 2022-10-05 DIAGNOSIS — I10 Essential (primary) hypertension: Secondary | ICD-10-CM | POA: Diagnosis not present

## 2022-10-05 DIAGNOSIS — I69351 Hemiplegia and hemiparesis following cerebral infarction affecting right dominant side: Secondary | ICD-10-CM | POA: Diagnosis not present

## 2022-10-05 DIAGNOSIS — Z794 Long term (current) use of insulin: Secondary | ICD-10-CM

## 2022-10-05 MED ORDER — BLOOD GLUCOSE TEST VI STRP: ORAL_STRIP | 11 refills | Status: DC

## 2022-10-05 MED ORDER — ROSUVASTATIN CALCIUM 40 MG PO TABS: ORAL_TABLET | ORAL | 1 refills | Status: DC

## 2022-10-05 MED ORDER — AMLODIPINE BESYLATE 10 MG PO TABS: 10.0000 mg | ORAL_TABLET | Freq: Every day | ORAL | 1 refills | Status: DC

## 2022-10-05 MED ORDER — CITALOPRAM HYDROBROMIDE 10 MG PO TABS: 10.0000 mg | ORAL_TABLET | Freq: Every day | ORAL | 1 refills | Status: DC

## 2022-10-05 MED ORDER — UMECLIDINIUM BROMIDE 62.5 MCG/ACT IN AEPB: 1.0000 | INHALATION_SPRAY | Freq: Every day | RESPIRATORY_TRACT | 1 refills | Status: DC

## 2022-10-05 MED ORDER — LANCETS 28G MISC: 1.0000 [IU] | Freq: Four times a day (QID) | 11 refills | Status: DC | PRN

## 2022-10-05 MED ORDER — HUMALOG KWIKPEN 100 UNIT/ML ~~LOC~~ SOPN: 3.0000 [IU] | PEN_INJECTOR | Freq: Every day | SUBCUTANEOUS | 11 refills | Status: DC

## 2022-10-05 MED ORDER — TRESIBA FLEXTOUCH 100 UNIT/ML ~~LOC~~ SOPN: 10.0000 [IU] | PEN_INJECTOR | Freq: Two times a day (BID) | SUBCUTANEOUS | 11 refills | Status: DC

## 2022-10-05 MED ORDER — FUROSEMIDE 20 MG PO TABS: 20.0000 mg | ORAL_TABLET | Freq: Every day | ORAL | 1 refills | Status: DC | PRN

## 2022-10-05 MED ORDER — TIZANIDINE HCL 4 MG PO TABS: ORAL_TABLET | ORAL | 1 refills | Status: DC

## 2022-10-05 NOTE — Progress Notes (Signed)
Worthy Keeler, DNP, AGNP-c [place]  [address] [place], [place] [address] [phone]  ESTABLISHED PATIENT- Chronic Health and/or Follow-Up Visit  Blood pressure 132/82, pulse (!) 56, last menstrual period 12/10/2012.    Kelly Gibson is a 62 y.o. year old female presenting today for evaluation and management of the following:  Kelly Gibson presents today seeking assistance with completing a medical transport form, due to her mobility limitations stemming from a history of stroke. This medical event has led to right-sided paralysis and impaired motor skills, necessitating the use of a wheelchair and two electric motor scooters for mobility.  She has no walking capacity and her ability to independently travel is less than three blocks with the aid of her mobility devices and indicates an inability to navigate hills or steep terrains. It is noted that she has a personal care attendant who accompanies her during travel at times.  The patient reports experiencing swelling in one leg, for which she takes Lasix as needed. Her current medication regimen includes Amlodipine, Citalopram, Tresiba, Insulin, and Tizanidine.  In terms of diabetes management, the patient's husband assists by administering 10 units of Tresiba insulin in the morning and at bedtime and 4 units of Humalog insulin at lunchtime. She reports that her blood sugar levels have been well-controlled, with morning readings approximately 115-120 and postprandial readings below 150-180.  Additionally, the patient has a history of asthma, which she mentions is triggered by emotional distress. However, she reports that her asthma symptoms have not been significantly bothersome recently.  All ROS negative with exception of what is listed above.   PHYSICAL EXAM Physical Exam Vitals reviewed.  Constitutional:      Appearance: Normal appearance.  HENT:     Head: Normocephalic.  Eyes:     Pupils: Pupils are  equal, round, and reactive to light.  Neck:     Vascular: No carotid bruit.  Cardiovascular:     Rate and Rhythm: Normal rate and regular rhythm.     Pulses: Normal pulses.     Heart sounds: Normal heart sounds.  Pulmonary:     Effort: Pulmonary effort is normal.     Breath sounds: Wheezing present.  Abdominal:     General: Bowel sounds are normal.     Palpations: Abdomen is soft.  Musculoskeletal:     Cervical back: Neck supple.     Comments: Right sided paralysis  Lymphadenopathy:     Cervical: No cervical adenopathy.  Skin:    General: Skin is warm and dry.  Neurological:     General: No focal deficit present.     Mental Status: She is alert and oriented to person, place, and time.     Cranial Nerves: Cranial nerve deficit present.     Motor: Weakness present.     Coordination: Coordination abnormal.     Comments: Right side paralysis of upper and lower extremities with weakness present to the face.   Psychiatric:        Mood and Affect: Mood normal.        Behavior: Behavior normal.     PLAN Problem List Items Addressed This Visit     Type 2 diabetes mellitus with diabetic neuropathy, with long-term current use of insulin ([place]) - Primary    The patient's diabetes is well-managed with a regimen of Treciba and Humalog insulin. Morning blood sugar levels are consistently between 115-120 mg/dL, with postprandial levels not exceeding 150 mg/dL. Plan: - Maintain the current insulin regimen, with [place]  insulin administered twice daily and Humalog insulin at lunchtime. Adjust dosages as needed based on ongoing blood sugar monitoring. - Ensure a sufficient supply of diabetes management supplies, such as test strips and lancets, is available.      Relevant Medications   insulin degludec (TRESIBA FLEXTOUCH) 100 UNIT/ML FlexTouch Pen   Glucose Blood (BLOOD GLUCOSE TEST STRIPS) STRP   rosuvastatin (CRESTOR) 40 MG tablet   HUMALOG KWIKPEN 100 UNIT/ML KwikPen   Other Relevant  Orders   CBC with Differential/Platelet (Completed)   Comprehensive metabolic panel (Completed)   Hemoglobin A1c (Completed)   LP+LDL Direct (Completed)   Chronic obstructive pulmonary disease ([place])    The patient reports that COPD/Asthma overlap symptoms are triggered exclusively by emotional upset but are currently not causing any issues. Plan: - No changes to the existing management plan. Continue to monitor symptoms and identify triggers.      Relevant Medications   umeclidinium bromide (INCRUSE ELLIPTA) 62.5 MCG/ACT AEPB   Other Relevant Orders   CBC with Differential/Platelet (Completed)   Comprehensive metabolic panel (Completed)   Hemoglobin A1c (Completed)   LP+LDL Direct (Completed)   Hemiparesis affecting right side as late effect of cerebrovascular accident (CVA) ([place])    The patient has a history of stroke, which has resulted in paralysis on the right side. This condition has impacted the patient's motor skills, necessitating the use of mobility aids such as a wheelchair and two electric motor scooters. Additionally, the patient requires assistance with transportation and has a personal care attendant for support. Plan: - Continue with the existing supportive care and rehabilitation plan. - Paperwork completed during visit today and sent to transportation services for medical transport services as previously discussed.       Relevant Medications   tiZANidine (ZANAFLEX) 4 MG tablet   Other Relevant Orders   CBC with Differential/Platelet (Completed)   Comprehensive metabolic panel (Completed)   Hemoglobin A1c (Completed)   LP+LDL Direct (Completed)   Chronic kidney disease (CKD), stage III (moderate) (HCC)    Will monitor labs today for further evaluation. In the setting of multiple chronic conditions including HTN and DM, close care is needed to ensure kidney function remains stable.       Relevant Orders   CBC with Differential/Platelet (Completed)   Comprehensive  metabolic panel (Completed)   Hemoglobin A1c (Completed)   LP+LDL Direct (Completed)   Hyperlipidemia associated with type 2 diabetes mellitus ([place])    Chronic condition currently managed with rosuvastatin.  Plan: - Labs pending today - Refills provided      Relevant Medications   amLODipine (NORVASC) 10 MG tablet   furosemide (LASIX) 20 MG tablet   insulin degludec (TRESIBA FLEXTOUCH) 100 UNIT/ML FlexTouch Pen   rosuvastatin (CRESTOR) 40 MG tablet   HUMALOG KWIKPEN 100 UNIT/ML KwikPen   Depression with anxiety    Chronic with no new or worsening symptoms reported today.  Plan: - continue current medication regimen with citalopram.  - refills provided today      Relevant Medications   citalopram (CELEXA) 10 MG tablet   Other Relevant Orders   CBC with Differential/Platelet (Completed)   Comprehensive metabolic panel (Completed)   Hemoglobin A1c (Completed)   LP+LDL Direct (Completed)   Hypertension associated with diabetes ([place])    Chronic condition with no alarm symptoms present today. Blood pressure is slightly above goal of < 130/80.  Plan: - continue current management  - refills provided today - labs pending - consider increase in dosage/medication change if  levels remain elevated.       Relevant Medications   amLODipine (NORVASC) 10 MG tablet   furosemide (LASIX) 20 MG tablet   insulin degludec (TRESIBA FLEXTOUCH) 100 UNIT/ML FlexTouch Pen   rosuvastatin (CRESTOR) 40 MG tablet   HUMALOG KWIKPEN 100 UNIT/ML KwikPen   Other Visit Diagnoses     Essential hypertension, benign       Relevant Medications   amLODipine (NORVASC) 10 MG tablet   furosemide (LASIX) 20 MG tablet   rosuvastatin (CRESTOR) 40 MG tablet   Other Relevant Orders   CBC with Differential/Platelet (Completed)   Comprehensive metabolic panel (Completed)   Hemoglobin A1c (Completed)   LP+LDL Direct (Completed)       Return in about 6 months (around 04/07/2023) for chronic  care.   Worthy Keeler, DNP, AGNP-c 10/05/2022  1:34 PM

## 2022-10-06 ENCOUNTER — Encounter: Payer: Self-pay | Admitting: Nurse Practitioner

## 2022-10-06 LAB — COMPREHENSIVE METABOLIC PANEL
ALT: 13 IU/L (ref 0–32)
AST: 23 IU/L (ref 0–40)
Albumin/Globulin Ratio: 1.3 (ref 1.2–2.2)
Albumin: 4 g/dL (ref 3.8–4.9)
Alkaline Phosphatase: 103 IU/L (ref 44–121)
BUN/Creatinine Ratio: 14 (ref 12–28)
BUN: 20 mg/dL (ref 8–27)
Bilirubin Total: 0.3 mg/dL (ref 0.0–1.2)
CO2: 27 mmol/L (ref 20–29)
Calcium: 9.6 mg/dL (ref 8.7–10.3)
Chloride: 103 mmol/L (ref 96–106)
Creatinine, Ser: 1.39 mg/dL — ABNORMAL HIGH (ref 0.57–1.00)
Globulin, Total: 3.2 g/dL (ref 1.5–4.5)
Glucose: 162 mg/dL — ABNORMAL HIGH (ref 70–99)
Potassium: 4.9 mmol/L (ref 3.5–5.2)
Sodium: 144 mmol/L (ref 134–144)
Total Protein: 7.2 g/dL (ref 6.0–8.5)
eGFR: 43 mL/min/{1.73_m2} — ABNORMAL LOW (ref >59)

## 2022-10-06 LAB — CBC WITH DIFFERENTIAL/PLATELET
Basophils Absolute: 0.1 10*3/uL (ref 0.0–0.2)
Basos: 1 %
EOS (ABSOLUTE): 0.2 10*3/uL (ref 0.0–0.4)
Eos: 4 %
Hematocrit: 39.3 % (ref 34.0–46.6)
Hemoglobin: 12.8 g/dL (ref 11.1–15.9)
Immature Grans (Abs): 0 10*3/uL (ref 0.0–0.1)
Immature Granulocytes: 0 %
Lymphocytes Absolute: 1.6 10*3/uL (ref 0.7–3.1)
Lymphs: 26 %
MCH: 27.1 pg (ref 26.6–33.0)
MCHC: 32.6 g/dL (ref 31.5–35.7)
MCV: 83 fL (ref 79–97)
Monocytes Absolute: 0.5 10*3/uL (ref 0.1–0.9)
Monocytes: 8 %
Neutrophils Absolute: 3.7 10*3/uL (ref 1.4–7.0)
Neutrophils: 61 %
Platelets: 310 10*3/uL (ref 150–450)
RBC: 4.72 x10E6/uL (ref 3.77–5.28)
RDW: 13.8 % (ref 11.7–15.4)
WBC: 6.1 10*3/uL (ref 3.4–10.8)

## 2022-10-06 LAB — LP+LDL DIRECT
Cholesterol, Total: 146 mg/dL (ref 100–199)
HDL: 53 mg/dL (ref >39)
LDL Chol Calc (NIH): 64 mg/dL (ref 0–99)
LDL Direct: 61 mg/dL (ref 0–99)
Triglycerides: 175 mg/dL — ABNORMAL HIGH (ref 0–149)
VLDL Cholesterol Cal: 29 mg/dL (ref 5–40)

## 2022-10-06 LAB — HEMOGLOBIN A1C
Est. average glucose Bld gHb Est-mCnc: 189 mg/dL
Hgb A1c MFr Bld: 8.2 % — ABNORMAL HIGH (ref 4.8–5.6)

## 2022-10-06 NOTE — Assessment & Plan Note (Signed)
The patient has a history of stroke, which has resulted in paralysis on the right side. This condition has impacted the patient's motor skills, necessitating the use of mobility aids such as a wheelchair and two electric motor scooters. Additionally, the patient requires assistance with transportation and has a personal care attendant for support. Plan: - Continue with the existing supportive care and rehabilitation plan. - Paperwork completed during visit today and sent to transportation services for medical transport services as previously discussed.

## 2022-10-06 NOTE — Assessment & Plan Note (Signed)
Will monitor labs today for further evaluation. In the setting of multiple chronic conditions including HTN and DM, close care is needed to ensure kidney function remains stable.

## 2022-10-06 NOTE — Assessment & Plan Note (Signed)
The patient's diabetes is well-managed with a regimen of Treciba and Humalog insulin. Morning blood sugar levels are consistently between 115-120 mg/dL, with postprandial levels not exceeding 150 mg/dL. Plan: - Maintain the current insulin regimen, with [place] insulin administered twice daily and Humalog insulin at lunchtime. Adjust dosages as needed based on ongoing blood sugar monitoring. - Ensure a sufficient supply of diabetes management supplies, such as test strips and lancets, is available.

## 2022-10-06 NOTE — Assessment & Plan Note (Signed)
>>  ASSESSMENT AND PLAN FOR TYPE 2 DIABETES MELLITUS WITH DIABETIC NEUROPATHY, WITH LONG-TERM CURRENT USE OF INSULIN  (HCC) WRITTEN ON 10/06/2022  7:50 PM BY Ziasia Lenoir E, NP  The patient's diabetes is well-managed with a regimen of Treciba and Humalog  insulin . Morning blood sugar levels are consistently between 115-120 mg/dL, with postprandial levels not exceeding 150 mg/dL. Plan: - Maintain the current insulin  regimen, with Treciba insulin  administered twice daily and Humalog  insulin  at lunchtime. Adjust dosages as needed based on ongoing blood sugar monitoring. - Ensure a sufficient supply of diabetes management supplies, such as test strips and lancets, is available.

## 2022-10-06 NOTE — Assessment & Plan Note (Signed)
Chronic condition with no alarm symptoms present today. Blood pressure is slightly above goal of < 130/80.  Plan: - continue current management  - refills provided today - labs pending - consider increase in dosage/medication change if levels remain elevated.

## 2022-10-06 NOTE — Assessment & Plan Note (Signed)
The patient reports that COPD/Asthma overlap symptoms are triggered exclusively by emotional upset but are currently not causing any issues. Plan: - No changes to the existing management plan. Continue to monitor symptoms and identify triggers.

## 2022-10-06 NOTE — Assessment & Plan Note (Signed)
Chronic condition currently managed with rosuvastatin.  Plan: - Labs pending today - Refills provided

## 2022-10-06 NOTE — Assessment & Plan Note (Signed)
Chronic with no new or worsening symptoms reported today.  Plan: - continue current medication regimen with citalopram.  - refills provided today

## 2022-11-16 ENCOUNTER — Encounter: Payer: Self-pay | Admitting: Nurse Practitioner

## 2022-11-16 ENCOUNTER — Ambulatory Visit (INDEPENDENT_AMBULATORY_CARE_PROVIDER_SITE_OTHER): Payer: Medicare Other | Admitting: Nurse Practitioner

## 2022-11-16 VITALS — BP 132/78 | HR 48

## 2022-11-16 DIAGNOSIS — L989 Disorder of the skin and subcutaneous tissue, unspecified: Secondary | ICD-10-CM

## 2022-11-16 MED ORDER — MUPIROCIN 2 % EX OINT: TOPICAL_OINTMENT | Freq: Every day | CUTANEOUS | 1 refills | Status: DC

## 2022-11-16 NOTE — Patient Instructions (Signed)
I have sent a referral to the dermatologist. They will contact you to set this up. Please let me know if the appointment is going to be a long time and we can see what else we can do.

## 2022-11-16 NOTE — Progress Notes (Unsigned)
  Tollie Eth, DNP, AGNP-c [facility] [address] [place], [place] [address] [phone]  Subjective:   Kelly Gibson is a 62 y.o. female presents to day for evaluation of: [name] presents today with concerns for small wound on her left frontal region of the head that has been present for several months. She reports the area initially appeared as a "pimple" but never had any drainage. Shortly thereafter, the area began to appear scaly and slightly raised with mild erythema around the edges. She tells me that it has remained this way since that time without healing. She has concerns for possible skin cancer.   PMH, Medications, and Allergies reviewed and updated in chart as appropriate.   ROS negative except for what is listed in HPI. Objective:  BP 132/78   Pulse (!) 48   LMP 12/10/2012 (Exact Date)  Physical Exam Vitals and nursing note reviewed.  Constitutional:      Appearance: Normal appearance. She is not ill-appearing.  Eyes:     Pupils: Pupils are equal, round, and reactive to light.  Cardiovascular:     Rate and Rhythm: Normal rate and regular rhythm.     Pulses: Normal pulses.     Heart sounds: Normal heart sounds.  Pulmonary:     Effort: Pulmonary effort is normal.     Breath sounds: Normal breath sounds.  Abdominal:     General: Bowel sounds are normal.     Palpations: Abdomen is soft.  Musculoskeletal:     Cervical back: Normal range of motion.  Skin:    General: Skin is warm and dry.     Capillary Refill: Capillary refill takes less than 2 seconds.       Neurological:     General: No focal deficit present.     Mental Status: She is alert and oriented to person, place, and time.  Psychiatric:        Mood and Affect: Mood normal.           Assessment & Plan:   Problem List Items Addressed This Visit     Skin lesion of face - Primary    Scaling lesion to left frontal area at hairline present without healing for more than 3  months. I do have concerns that this could be a cancerous lesion, given that it is not healing. The area is not tender and there is no drainage present. At this time I feel the most reasonable treatment option is to proceed with a dermatology referral for further evaluation and recommendations. Given the location, I do not feel comfortable with removal in the primary care setting. The patient is agreeable to this plan. Referral placed. Prescription for mupirocin provided to apply to area daily to help aid with healing in the event this is not a skin cancer.       Relevant Medications   mupirocin ointment (BACTROBAN) 2 %   Other Relevant Orders   Ambulatory referral to Dermatology      Tollie Eth, DNP, AGNP-c 11/17/2022  8:26 AM    History, Medications, Surgery, SDOH, and Family History reviewed and updated as appropriate.

## 2022-11-17 DIAGNOSIS — L989 Disorder of the skin and subcutaneous tissue, unspecified: Secondary | ICD-10-CM | POA: Insufficient documentation

## 2022-11-17 HISTORY — DX: Disorder of the skin and subcutaneous tissue, unspecified: L98.9

## 2022-11-17 NOTE — Assessment & Plan Note (Addendum)
Scaling lesion to left frontal area at hairline present without healing for more than 3 months. I do have concerns that this could be a cancerous lesion, given that it is not healing. The area is not tender and there is no drainage present. At this time I feel the most reasonable treatment option is to proceed with a dermatology referral for further evaluation and recommendations. Given the location, I do not feel comfortable with removal in the primary care setting. The patient is agreeable to this plan. Referral placed. Prescription for mupirocin provided to apply to area daily to help aid with healing in the event this is not a skin cancer.

## 2023-02-08 DIAGNOSIS — N1832 Chronic kidney disease, stage 3b: Secondary | ICD-10-CM | POA: Diagnosis not present

## 2023-02-15 DIAGNOSIS — D631 Anemia in chronic kidney disease: Secondary | ICD-10-CM | POA: Diagnosis not present

## 2023-02-15 DIAGNOSIS — I129 Hypertensive chronic kidney disease with stage 1 through stage 4 chronic kidney disease, or unspecified chronic kidney disease: Secondary | ICD-10-CM | POA: Diagnosis not present

## 2023-02-15 DIAGNOSIS — N2581 Secondary hyperparathyroidism of renal origin: Secondary | ICD-10-CM | POA: Diagnosis not present

## 2023-02-15 DIAGNOSIS — N1832 Chronic kidney disease, stage 3b: Secondary | ICD-10-CM | POA: Diagnosis not present

## 2023-02-17 ENCOUNTER — Other Ambulatory Visit: Payer: Self-pay | Admitting: Physician Assistant

## 2023-02-17 DIAGNOSIS — L309 Dermatitis, unspecified: Secondary | ICD-10-CM | POA: Diagnosis not present

## 2023-02-17 DIAGNOSIS — L218 Other seborrheic dermatitis: Secondary | ICD-10-CM | POA: Diagnosis not present

## 2023-02-17 DIAGNOSIS — D485 Neoplasm of uncertain behavior of skin: Secondary | ICD-10-CM | POA: Diagnosis not present

## 2023-03-09 DIAGNOSIS — E7800 Pure hypercholesterolemia, unspecified: Secondary | ICD-10-CM | POA: Diagnosis not present

## 2023-03-09 DIAGNOSIS — E1165 Type 2 diabetes mellitus with hyperglycemia: Secondary | ICD-10-CM | POA: Diagnosis not present

## 2023-03-09 DIAGNOSIS — N1831 Chronic kidney disease, stage 3a: Secondary | ICD-10-CM | POA: Diagnosis not present

## 2023-03-09 DIAGNOSIS — I69353 Hemiplegia and hemiparesis following cerebral infarction affecting right non-dominant side: Secondary | ICD-10-CM | POA: Diagnosis not present

## 2023-03-09 DIAGNOSIS — I10 Essential (primary) hypertension: Secondary | ICD-10-CM | POA: Diagnosis not present

## 2023-03-10 ENCOUNTER — Telehealth: Payer: Self-pay | Admitting: Nurse Practitioner

## 2023-03-10 ENCOUNTER — Ambulatory Visit
Admission: RE | Admit: 2023-03-10 | Discharge: 2023-03-10 | Disposition: A | Discharge status: Home / Self Care | Payer: Medicare Other | Source: Ambulatory Visit | Attending: Nurse Practitioner | Admitting: Nurse Practitioner

## 2023-03-10 ENCOUNTER — Other Ambulatory Visit: Payer: Self-pay | Admitting: Nurse Practitioner

## 2023-03-10 DIAGNOSIS — Z0000 Encounter for general adult medical examination without abnormal findings: Secondary | ICD-10-CM

## 2023-03-10 DIAGNOSIS — Z1231 Encounter for screening mammogram for malignant neoplasm of breast: Secondary | ICD-10-CM | POA: Diagnosis not present

## 2023-03-10 NOTE — Telephone Encounter (Signed)
Fayrene Fearing called to let you know Kelly Gibson will need to be referred to get an xray. She does have an appointment with you on 9/12 and her dermatologist is supposed to contact you on what the x ray is needed for.

## 2023-03-30 ENCOUNTER — Encounter: Payer: Self-pay | Admitting: Nurse Practitioner

## 2023-03-31 ENCOUNTER — Other Ambulatory Visit: Payer: Self-pay | Admitting: Nurse Practitioner

## 2023-03-31 DIAGNOSIS — J410 Simple chronic bronchitis: Secondary | ICD-10-CM

## 2023-04-03 ENCOUNTER — Other Ambulatory Visit: Payer: Self-pay | Admitting: Nurse Practitioner

## 2023-04-03 DIAGNOSIS — E782 Mixed hyperlipidemia: Secondary | ICD-10-CM

## 2023-04-08 ENCOUNTER — Encounter: Payer: Self-pay | Admitting: Nurse Practitioner

## 2023-04-08 ENCOUNTER — Ambulatory Visit (INDEPENDENT_AMBULATORY_CARE_PROVIDER_SITE_OTHER): Payer: Medicare Other | Admitting: Nurse Practitioner

## 2023-04-08 ENCOUNTER — Ambulatory Visit: Payer: Medicare Other | Admitting: Physical Therapy

## 2023-04-08 VITALS — BP 118/70 | HR 46 | Wt 194.6 lb

## 2023-04-08 DIAGNOSIS — E1169 Type 2 diabetes mellitus with other specified complication: Secondary | ICD-10-CM | POA: Diagnosis not present

## 2023-04-08 DIAGNOSIS — I69391 Dysphagia following cerebral infarction: Secondary | ICD-10-CM

## 2023-04-08 DIAGNOSIS — E782 Mixed hyperlipidemia: Secondary | ICD-10-CM | POA: Diagnosis not present

## 2023-04-08 DIAGNOSIS — N1832 Chronic kidney disease, stage 3b: Secondary | ICD-10-CM | POA: Diagnosis not present

## 2023-04-08 DIAGNOSIS — Z87891 Personal history of nicotine dependence: Secondary | ICD-10-CM

## 2023-04-08 DIAGNOSIS — J410 Simple chronic bronchitis: Secondary | ICD-10-CM | POA: Diagnosis not present

## 2023-04-08 DIAGNOSIS — G8111 Spastic hemiplegia affecting right dominant side: Secondary | ICD-10-CM

## 2023-04-08 DIAGNOSIS — E1140 Type 2 diabetes mellitus with diabetic neuropathy, unspecified: Secondary | ICD-10-CM | POA: Diagnosis not present

## 2023-04-08 DIAGNOSIS — I10 Essential (primary) hypertension: Secondary | ICD-10-CM | POA: Diagnosis not present

## 2023-04-08 DIAGNOSIS — I69351 Hemiplegia and hemiparesis following cerebral infarction affecting right dominant side: Secondary | ICD-10-CM

## 2023-04-08 DIAGNOSIS — I69398 Other sequelae of cerebral infarction: Secondary | ICD-10-CM | POA: Diagnosis not present

## 2023-04-08 DIAGNOSIS — J449 Chronic obstructive pulmonary disease, unspecified: Secondary | ICD-10-CM

## 2023-04-08 DIAGNOSIS — I152 Hypertension secondary to endocrine disorders: Secondary | ICD-10-CM

## 2023-04-08 DIAGNOSIS — F325 Major depressive disorder, single episode, in full remission: Secondary | ICD-10-CM

## 2023-04-08 MED ORDER — AMLODIPINE BESYLATE 5 MG PO TABS: 5.0000 mg | ORAL_TABLET | Freq: Every day | ORAL | 3 refills | Status: DC

## 2023-04-08 MED ORDER — ROSUVASTATIN CALCIUM 40 MG PO TABS: ORAL_TABLET | ORAL | 3 refills | Status: DC

## 2023-04-08 NOTE — Progress Notes (Signed)
Shawna Clamp, DNP, AGNP-c [facility]  [address] [place], [place] [address] [phone]  ESTABLISHED PATIENT- Chronic Health and/or Follow-Up Visit  Blood pressure 118/70, pulse (!) 46, weight 194 lb 9.6 oz (88.3 kg), last menstrual period 12/10/2012, SpO2 95%.    Kelly Gibson is a 62 y.o. year old female presenting today for evaluation and management of chronic conditions.   [name] has recently been experiencing increased mucous production, cough, and choking while eating. She reports that this does seem to be improving, but the issues are happening about once a day, still. She does not have any fevers, chills, or other symptoms.   She is followed with Dr. Talmage Nap and Dennie Maizes for endocrinology and recently had labs with their office. We will work to obtain this.   All ROS negative with exception of what is listed above.   PHYSICAL EXAM Physical Exam Vitals and nursing note reviewed.  Constitutional:      General: She is not in acute distress.    Appearance: Normal appearance. She is not ill-appearing.  Eyes:     Conjunctiva/sclera: Conjunctivae normal.  Cardiovascular:     Rate and Rhythm: Normal rate and regular rhythm.     Pulses: Normal pulses.     Heart sounds: Normal heart sounds.  Pulmonary:     Effort: Pulmonary effort is normal.     Breath sounds: Wheezing present.  Skin:    General: Skin is warm and dry.     Capillary Refill: Capillary refill takes less than 2 seconds.  Neurological:     Mental Status: She is alert. Mental status is at baseline.  Psychiatric:        Mood and Affect: Mood normal.     PLAN Problem List Items Addressed This Visit     Type 2 diabetes mellitus with diabetic neuropathy, with long-term current use of insulin (HCC)    Chronic diabetes managed with tresiba twice a day and humalog at lunch. No episodes of hypoglycemia. Reports CBG readings are stable in the 120's-160's. She is followed with Dr. Talmage Nap  and NP Dennie Maizes for endocrine. Most recent visit with them within the last couple of weeks. - Continue current regimen. No alarm symptoms present.  - Continue to follow with endocrinology. I will assist.      Relevant Medications   rosuvastatin (CRESTOR) 40 MG tablet   Chronic obstructive pulmonary disease (HCC) - Primary    COPD/Asthma overlay syndrome with recent exacerbation in upper respiratory symptoms and mucous production. This appears to have triggered difficulty with swallowing. This is improving at this time. We discussed the importance of chewing well and tucking chin to chest while swallowing to aid in direction of foods. I feel that it is imperative to ensure that her breathing issues are well controlled to reduce the risk of aspiration. A sample of Trelegy provided today to see if this is helpful with respiratory symptoms. If this is helpful, we can send a full prescription for her.       Relevant Medications   Budeson-Glycopyrrol-Formoterol (BREZTRI AEROSPHERE) 160-9-4.8 MCG/ACT AERO   Other Relevant Orders   DG Chest 2 View   Hemiparesis affecting right side as late effect of cerebrovascular accident (CVA) (HCC)    Chronic right sided paralysis with impact on motor skills, speech, and swallowing. Currently managing with motorized scooter and wheelchair. Recent choking likely related to both stroke sequela and increased mucous production. Discussion of ways to assist with swallowing for safer intake.  -  Continue to use motorized equipment for aid  - Eat slowly and tuck your chin when swallowing.       Relevant Orders   DG Chest 2 View   Dysphagia, post-stroke    Chronic with recent increase in choking while eating. I feel this is likely related to increased mucous production. We discussed ways to help manage this. Will treat COPD as well to ensure that mucous production is under better control.       Chronic kidney disease (CKD), stage III (moderate) (HCC)    Chronic.  Kidney function stable. Labs recently with endocrinology. Will continue to monitor closely.       Hyperlipidemia associated with type 2 diabetes mellitus (HCC)    Chronic. Currently managed with rosuvastatin. No concerns at this time.  - Continue current medications      Relevant Medications   amLODipine (NORVASC) 5 MG tablet   rosuvastatin (CRESTOR) 40 MG tablet   Hypertension associated with diabetes (HCC)    Chronic. Currently managed with amlodipine 5mg . She appears to be stable on this and her blood pressure is well controlled. Recommend at home monitoring to ensure this is not dropping too low.       Relevant Medications   amLODipine (NORVASC) 5 MG tablet   rosuvastatin (CRESTOR) 40 MG tablet   Major depression in remission (HCC)    Chronic and stable at this time with no alarm symptoms present. Continue current treatment.       Other Visit Diagnoses     Smoking history       Relevant Medications   Budeson-Glycopyrrol-Formoterol (BREZTRI AEROSPHERE) 160-9-4.8 MCG/ACT AERO   Other Relevant Orders   Ambulatory Referral for Lung Cancer Scre   Essential hypertension, benign       Relevant Medications   amLODipine (NORVASC) 5 MG tablet   rosuvastatin (CRESTOR) 40 MG tablet   Gait disturbance, post-stroke       Right spastic hemiplegia (HCC)           Return in about 6 months (around 10/06/2023) for Med Management 30, AWV.  Shawna Clamp, DNP, AGNP-c

## 2023-04-08 NOTE — Patient Instructions (Signed)
You can go on for the chest x-ray to [facility].

## 2023-04-12 ENCOUNTER — Encounter: Payer: Self-pay | Admitting: Nurse Practitioner

## 2023-04-23 ENCOUNTER — Encounter: Payer: Self-pay | Admitting: Nurse Practitioner

## 2023-04-25 ENCOUNTER — Encounter: Payer: Self-pay | Admitting: Nurse Practitioner

## 2023-04-25 DIAGNOSIS — F325 Major depressive disorder, single episode, in full remission: Secondary | ICD-10-CM | POA: Insufficient documentation

## 2023-04-25 MED ORDER — BREZTRI AEROSPHERE 160-9-4.8 MCG/ACT IN AERO
2.0000 | INHALATION_SPRAY | Freq: Two times a day (BID) | RESPIRATORY_TRACT | 11 refills | Status: DC
Start: 2023-04-25 — End: 2023-12-11

## 2023-04-25 NOTE — Assessment & Plan Note (Signed)
Chronic with recent increase in choking while eating. I feel this is likely related to increased mucous production. We discussed ways to help manage this. Will treat COPD as well to ensure that mucous production is under better control.

## 2023-04-25 NOTE — Assessment & Plan Note (Addendum)
Chronic diabetes managed with tresiba twice a day and humalog at lunch. No episodes of hypoglycemia. Reports CBG readings are stable in the 120's-160's. She is followed with Dr. Talmage Nap and NP Dennie Maizes for endocrine. Most recent visit with them within the last couple of weeks. - Continue current regimen. No alarm symptoms present.  - Continue to follow with endocrinology. I will assist.

## 2023-04-25 NOTE — Assessment & Plan Note (Signed)
>>  ASSESSMENT AND PLAN FOR TYPE 2 DIABETES MELLITUS WITH DIABETIC NEUROPATHY, WITH LONG-TERM CURRENT USE OF INSULIN  (HCC) WRITTEN ON 04/25/2023  5:26 PM BY Jigar Zielke E, NP  Chronic diabetes managed with tresiba  twice a day and humalog  at lunch. No episodes of hypoglycemia. Reports CBG readings are stable in the 120's-160's. She is followed with Dr. Balan and NP Harlene Bill for endocrine. Most recent visit with them within the last couple of weeks. - Continue current regimen. No alarm symptoms present.  - Continue to follow with endocrinology. I will assist.

## 2023-04-25 NOTE — Assessment & Plan Note (Signed)
COPD/Asthma overlay syndrome with recent exacerbation in upper respiratory symptoms and mucous production. This appears to have triggered difficulty with swallowing. This is improving at this time. We discussed the importance of chewing well and tucking chin to chest while swallowing to aid in direction of foods. I feel that it is imperative to ensure that her breathing issues are well controlled to reduce the risk of aspiration. A sample of Trelegy provided today to see if this is helpful with respiratory symptoms. If this is helpful, we can send a full prescription for her.

## 2023-04-25 NOTE — Assessment & Plan Note (Signed)
Chronic. Kidney function stable. Labs recently with endocrinology. Will continue to monitor closely.

## 2023-04-25 NOTE — Assessment & Plan Note (Signed)
Chronic right sided paralysis with impact on motor skills, speech, and swallowing. Currently managing with motorized scooter and wheelchair. Recent choking likely related to both stroke sequela and increased mucous production. Discussion of ways to assist with swallowing for safer intake.  - Continue to use motorized equipment for aid  - Eat slowly and tuck your chin when swallowing.

## 2023-04-25 NOTE — Assessment & Plan Note (Signed)
Chronic. Currently managed with amlodipine 5mg . She appears to be stable on this and her blood pressure is well controlled. Recommend at home monitoring to ensure this is not dropping too low.

## 2023-04-25 NOTE — Assessment & Plan Note (Signed)
Chronic. Currently managed with rosuvastatin. No concerns at this time.  - Continue current medications

## 2023-04-25 NOTE — Assessment & Plan Note (Signed)
Chronic and stable at this time with no alarm symptoms present. Continue current treatment.

## 2023-05-10 ENCOUNTER — Ambulatory Visit
Admission: RE | Admit: 2023-05-10 | Discharge: 2023-05-10 | Disposition: A | Discharge status: Home / Self Care | Payer: Medicare Other | Source: Ambulatory Visit | Attending: Nurse Practitioner | Admitting: Nurse Practitioner

## 2023-05-10 ENCOUNTER — Telehealth: Payer: Self-pay | Admitting: Nurse Practitioner

## 2023-05-10 DIAGNOSIS — I69351 Hemiplegia and hemiparesis following cerebral infarction affecting right dominant side: Secondary | ICD-10-CM

## 2023-05-10 DIAGNOSIS — R062 Wheezing: Secondary | ICD-10-CM | POA: Diagnosis not present

## 2023-05-10 NOTE — Telephone Encounter (Signed)
Pt husband says the pharmacy is out of 4 mg tizanidine but they have 2 mg, he is asking if this can be sent in to take twice a day? She uses [facility] [id number] - [place], [place] - [address]

## 2023-05-11 ENCOUNTER — Other Ambulatory Visit: Payer: Self-pay | Admitting: Nurse Practitioner

## 2023-05-11 DIAGNOSIS — I10 Essential (primary) hypertension: Secondary | ICD-10-CM

## 2023-05-11 MED ORDER — TIZANIDINE HCL 2 MG PO TABS: ORAL_TABLET | ORAL | 1 refills | Status: DC

## 2023-05-12 ENCOUNTER — Other Ambulatory Visit: Payer: Self-pay

## 2023-05-12 DIAGNOSIS — I10 Essential (primary) hypertension: Secondary | ICD-10-CM

## 2023-05-12 MED ORDER — AMLODIPINE BESYLATE 5 MG PO TABS: 5.0000 mg | ORAL_TABLET | Freq: Every day | ORAL | 3 refills | Status: DC

## 2023-06-01 ENCOUNTER — Other Ambulatory Visit: Payer: Self-pay | Admitting: Nurse Practitioner

## 2023-06-01 DIAGNOSIS — J189 Pneumonia, unspecified organism: Secondary | ICD-10-CM

## 2023-06-01 MED ORDER — AZITHROMYCIN 250 MG PO TABS
ORAL_TABLET | ORAL | 0 refills | Status: AC
Start: 2023-06-01 — End: 2023-06-06

## 2023-06-01 MED ORDER — AMOXICILLIN-POT CLAVULANATE 875-125 MG PO TABS
1.0000 | ORAL_TABLET | Freq: Two times a day (BID) | ORAL | 0 refills | Status: DC
Start: 2023-06-01 — End: 2023-07-23

## 2023-06-02 ENCOUNTER — Encounter: Payer: Self-pay | Admitting: Nurse Practitioner

## 2023-06-02 DIAGNOSIS — D863 Sarcoidosis of skin: Secondary | ICD-10-CM | POA: Diagnosis not present

## 2023-06-02 DIAGNOSIS — L218 Other seborrheic dermatitis: Secondary | ICD-10-CM | POA: Diagnosis not present

## 2023-06-28 ENCOUNTER — Other Ambulatory Visit: Payer: Self-pay | Admitting: Nurse Practitioner

## 2023-06-28 DIAGNOSIS — F418 Other specified anxiety disorders: Secondary | ICD-10-CM

## 2023-07-09 DIAGNOSIS — E1165 Type 2 diabetes mellitus with hyperglycemia: Secondary | ICD-10-CM | POA: Diagnosis not present

## 2023-07-23 ENCOUNTER — Encounter: Payer: Self-pay | Admitting: Nurse Practitioner

## 2023-07-23 ENCOUNTER — Other Ambulatory Visit: Payer: Self-pay | Admitting: Nurse Practitioner

## 2023-08-14 ENCOUNTER — Encounter: Payer: Self-pay | Admitting: Nurse Practitioner

## 2023-08-16 ENCOUNTER — Other Ambulatory Visit: Payer: Self-pay

## 2023-08-16 MED ORDER — ALBUTEROL SULFATE HFA 108 (90 BASE) MCG/ACT IN AERS: 2.0000 | INHALATION_SPRAY | Freq: Four times a day (QID) | RESPIRATORY_TRACT | 2 refills | Status: DC | PRN

## 2023-08-18 DIAGNOSIS — N2581 Secondary hyperparathyroidism of renal origin: Secondary | ICD-10-CM | POA: Diagnosis not present

## 2023-08-18 DIAGNOSIS — N189 Chronic kidney disease, unspecified: Secondary | ICD-10-CM | POA: Diagnosis not present

## 2023-08-18 DIAGNOSIS — I129 Hypertensive chronic kidney disease with stage 1 through stage 4 chronic kidney disease, or unspecified chronic kidney disease: Secondary | ICD-10-CM | POA: Diagnosis not present

## 2023-08-18 DIAGNOSIS — N1832 Chronic kidney disease, stage 3b: Secondary | ICD-10-CM | POA: Diagnosis not present

## 2023-08-18 DIAGNOSIS — D631 Anemia in chronic kidney disease: Secondary | ICD-10-CM | POA: Diagnosis not present

## 2023-08-19 LAB — LAB REPORT - SCANNED
Calcium: 10
EGFR: 48
PTH: 25

## 2023-08-24 ENCOUNTER — Telehealth: Payer: Self-pay

## 2023-08-24 NOTE — Progress Notes (Signed)
Care Guide Pharmacy Note  08/24/2023 Name: Kelly Gibson MRN: [id number] DOB: 03/11/1961  Referred By: Tollie Eth, NP Reason for referral: Care Coordination ([facility] Diabetes. )   Kelly Gibson is a 63 y.o. year old female who is a primary care patient of Early, Sung Amabile, NP.  Kelly Gibson was referred to the pharmacist for assistance related to: DMII  Successful contact was made with the patient to discuss pharmacy services.  Patient declines engagement at this time. Contact information was provided to the patient should they wish to reach out for assistance at a later time.  [facility]  [facility], [facility] Health Care Management Assistant Direct Dial: [phone]  Fax: [phone]

## 2023-09-06 ENCOUNTER — Telehealth: Payer: Self-pay | Admitting: Internal Medicine

## 2023-09-06 DIAGNOSIS — N189 Chronic kidney disease, unspecified: Secondary | ICD-10-CM | POA: Diagnosis not present

## 2023-09-06 DIAGNOSIS — I10 Essential (primary) hypertension: Secondary | ICD-10-CM | POA: Diagnosis not present

## 2023-09-06 DIAGNOSIS — E1165 Type 2 diabetes mellitus with hyperglycemia: Secondary | ICD-10-CM | POA: Diagnosis not present

## 2023-09-06 DIAGNOSIS — E7800 Pure hypercholesterolemia, unspecified: Secondary | ICD-10-CM | POA: Diagnosis not present

## 2023-09-06 DIAGNOSIS — I69353 Hemiplegia and hemiparesis following cerebral infarction affecting right non-dominant side: Secondary | ICD-10-CM | POA: Diagnosis not present

## 2023-09-06 DIAGNOSIS — N1831 Chronic kidney disease, stage 3a: Secondary | ICD-10-CM | POA: Diagnosis not present

## 2023-09-06 NOTE — Telephone Encounter (Signed)
 Patient was identified as falling into the [place] Measure - Diabetes.   Patient was: Referred to pharmacy for chronic disease management.

## 2023-09-14 ENCOUNTER — Telehealth: Payer: Self-pay

## 2023-09-14 NOTE — Progress Notes (Signed)
   09/14/2023  Patient ID: Barbette Or, female   DOB: 01/19/1961, 62 y.o.   MRN: [id number]  Attempted to contact patient to schedule an appt for management of diabetes. Left HIPAA compliant message for patient to return my call at their convenience.   Sherrill Raring, PharmD Clinical Pharmacist [phone]

## 2023-10-13 ENCOUNTER — Other Ambulatory Visit: Payer: Self-pay | Admitting: Nurse Practitioner

## 2023-10-13 ENCOUNTER — Encounter: Payer: Medicare Other | Admitting: Nurse Practitioner

## 2023-10-13 DIAGNOSIS — I69351 Hemiplegia and hemiparesis following cerebral infarction affecting right dominant side: Secondary | ICD-10-CM

## 2023-10-13 NOTE — Progress Notes (Deleted)
  Shawna Clamp, DNP, AGNP-c [facility]  [address] [place], [place] [address] [phone]  ESTABLISHED PATIENT- Chronic Health and/or Follow-Up Visit  Last menstrual period 12/10/2012.    Kelly Gibson is a 63 y.o. year old female presenting today for evaluation and management of chronic conditions.   Seeing endocrinology for DM. Last note from January shows A1c is elevated above goal. Patient missing some mealtime insulin doses. Plan is to work on improved medication management and recheck in 2 months with this provider. A1c from 07/09/2023 shows 9.3% CKD- managed with nephrology Hemiplegia- managed with neurology   All ROS negative with exception of what is listed above.   PHYSICAL EXAM Physical Exam   PLAN Problem List Items Addressed This Visit   None   No follow-ups on file.  Shawna Clamp, DNP, AGNP-c

## 2023-11-04 DIAGNOSIS — E1165 Type 2 diabetes mellitus with hyperglycemia: Secondary | ICD-10-CM | POA: Diagnosis not present

## 2023-11-11 DIAGNOSIS — E7800 Pure hypercholesterolemia, unspecified: Secondary | ICD-10-CM | POA: Diagnosis not present

## 2023-11-11 DIAGNOSIS — I10 Essential (primary) hypertension: Secondary | ICD-10-CM | POA: Diagnosis not present

## 2023-11-11 DIAGNOSIS — E1165 Type 2 diabetes mellitus with hyperglycemia: Secondary | ICD-10-CM | POA: Diagnosis not present

## 2023-11-23 ENCOUNTER — Encounter: Admitting: Nurse Practitioner

## 2023-11-27 ENCOUNTER — Other Ambulatory Visit: Payer: Self-pay | Admitting: Nurse Practitioner

## 2023-11-27 DIAGNOSIS — E1140 Type 2 diabetes mellitus with diabetic neuropathy, unspecified: Secondary | ICD-10-CM

## 2023-11-30 DIAGNOSIS — D863 Sarcoidosis of skin: Secondary | ICD-10-CM | POA: Diagnosis not present

## 2023-12-07 DIAGNOSIS — I639 Cerebral infarction, unspecified: Secondary | ICD-10-CM | POA: Diagnosis not present

## 2023-12-07 DIAGNOSIS — R9389 Abnormal findings on diagnostic imaging of other specified body structures: Secondary | ICD-10-CM | POA: Diagnosis not present

## 2023-12-07 DIAGNOSIS — E1122 Type 2 diabetes mellitus with diabetic chronic kidney disease: Secondary | ICD-10-CM | POA: Diagnosis not present

## 2023-12-07 DIAGNOSIS — J449 Chronic obstructive pulmonary disease, unspecified: Secondary | ICD-10-CM | POA: Diagnosis not present

## 2023-12-07 DIAGNOSIS — R600 Localized edema: Secondary | ICD-10-CM | POA: Diagnosis not present

## 2023-12-07 DIAGNOSIS — K8020 Calculus of gallbladder without cholecystitis without obstruction: Secondary | ICD-10-CM | POA: Diagnosis not present

## 2023-12-07 DIAGNOSIS — N136 Pyonephrosis: Secondary | ICD-10-CM | POA: Diagnosis not present

## 2023-12-07 DIAGNOSIS — R9431 Abnormal electrocardiogram [ECG] [EKG]: Secondary | ICD-10-CM | POA: Diagnosis not present

## 2023-12-07 DIAGNOSIS — R748 Abnormal levels of other serum enzymes: Secondary | ICD-10-CM | POA: Diagnosis not present

## 2023-12-07 DIAGNOSIS — R6520 Severe sepsis without septic shock: Secondary | ICD-10-CM | POA: Diagnosis not present

## 2023-12-07 DIAGNOSIS — J810 Acute pulmonary edema: Secondary | ICD-10-CM | POA: Diagnosis not present

## 2023-12-07 DIAGNOSIS — R7881 Bacteremia: Secondary | ICD-10-CM | POA: Diagnosis not present

## 2023-12-07 DIAGNOSIS — A419 Sepsis, unspecified organism: Secondary | ICD-10-CM | POA: Diagnosis not present

## 2023-12-07 DIAGNOSIS — N132 Hydronephrosis with renal and ureteral calculous obstruction: Secondary | ICD-10-CM | POA: Diagnosis not present

## 2023-12-07 DIAGNOSIS — R000 Tachycardia, unspecified: Secondary | ICD-10-CM | POA: Diagnosis not present

## 2023-12-07 DIAGNOSIS — N179 Acute kidney failure, unspecified: Secondary | ICD-10-CM | POA: Diagnosis not present

## 2023-12-07 DIAGNOSIS — I361 Nonrheumatic tricuspid (valve) insufficiency: Secondary | ICD-10-CM | POA: Diagnosis not present

## 2023-12-07 DIAGNOSIS — I517 Cardiomegaly: Secondary | ICD-10-CM | POA: Diagnosis not present

## 2023-12-07 DIAGNOSIS — A4150 Gram-negative sepsis, unspecified: Secondary | ICD-10-CM | POA: Diagnosis not present

## 2023-12-07 DIAGNOSIS — J811 Chronic pulmonary edema: Secondary | ICD-10-CM | POA: Diagnosis not present

## 2023-12-07 DIAGNOSIS — E119 Type 2 diabetes mellitus without complications: Secondary | ICD-10-CM | POA: Diagnosis not present

## 2023-12-07 DIAGNOSIS — J984 Other disorders of lung: Secondary | ICD-10-CM | POA: Diagnosis not present

## 2023-12-07 DIAGNOSIS — R001 Bradycardia, unspecified: Secondary | ICD-10-CM | POA: Diagnosis not present

## 2023-12-07 DIAGNOSIS — R7989 Other specified abnormal findings of blood chemistry: Secondary | ICD-10-CM | POA: Diagnosis not present

## 2023-12-07 DIAGNOSIS — I459 Conduction disorder, unspecified: Secondary | ICD-10-CM | POA: Diagnosis not present

## 2023-12-07 DIAGNOSIS — I509 Heart failure, unspecified: Secondary | ICD-10-CM | POA: Diagnosis not present

## 2023-12-07 DIAGNOSIS — N183 Chronic kidney disease, stage 3 unspecified: Secondary | ICD-10-CM | POA: Diagnosis not present

## 2023-12-07 DIAGNOSIS — I69351 Hemiplegia and hemiparesis following cerebral infarction affecting right dominant side: Secondary | ICD-10-CM | POA: Diagnosis not present

## 2023-12-07 DIAGNOSIS — R7401 Elevation of levels of liver transaminase levels: Secondary | ICD-10-CM | POA: Diagnosis not present

## 2023-12-07 DIAGNOSIS — I10 Essential (primary) hypertension: Secondary | ICD-10-CM | POA: Diagnosis not present

## 2023-12-07 DIAGNOSIS — I214 Non-ST elevation (NSTEMI) myocardial infarction: Secondary | ICD-10-CM | POA: Diagnosis not present

## 2023-12-07 DIAGNOSIS — I5030 Unspecified diastolic (congestive) heart failure: Secondary | ICD-10-CM | POA: Diagnosis not present

## 2023-12-07 DIAGNOSIS — E785 Hyperlipidemia, unspecified: Secondary | ICD-10-CM | POA: Diagnosis not present

## 2023-12-07 DIAGNOSIS — N390 Urinary tract infection, site not specified: Secondary | ICD-10-CM | POA: Diagnosis not present

## 2023-12-07 DIAGNOSIS — R590 Localized enlarged lymph nodes: Secondary | ICD-10-CM | POA: Diagnosis not present

## 2023-12-07 DIAGNOSIS — Z79899 Other long term (current) drug therapy: Secondary | ICD-10-CM | POA: Diagnosis not present

## 2023-12-07 DIAGNOSIS — I5033 Acute on chronic diastolic (congestive) heart failure: Secondary | ICD-10-CM | POA: Diagnosis not present

## 2023-12-07 DIAGNOSIS — E7800 Pure hypercholesterolemia, unspecified: Secondary | ICD-10-CM | POA: Diagnosis not present

## 2023-12-07 DIAGNOSIS — J9601 Acute respiratory failure with hypoxia: Secondary | ICD-10-CM | POA: Diagnosis not present

## 2023-12-07 DIAGNOSIS — I130 Hypertensive heart and chronic kidney disease with heart failure and stage 1 through stage 4 chronic kidney disease, or unspecified chronic kidney disease: Secondary | ICD-10-CM | POA: Diagnosis not present

## 2023-12-07 DIAGNOSIS — I442 Atrioventricular block, complete: Secondary | ICD-10-CM | POA: Diagnosis not present

## 2023-12-07 DIAGNOSIS — I340 Nonrheumatic mitral (valve) insufficiency: Secondary | ICD-10-CM | POA: Diagnosis not present

## 2023-12-07 DIAGNOSIS — E872 Acidosis, unspecified: Secondary | ICD-10-CM | POA: Diagnosis not present

## 2023-12-07 DIAGNOSIS — J441 Chronic obstructive pulmonary disease with (acute) exacerbation: Secondary | ICD-10-CM | POA: Diagnosis not present

## 2023-12-07 DIAGNOSIS — R0789 Other chest pain: Secondary | ICD-10-CM | POA: Diagnosis not present

## 2023-12-07 DIAGNOSIS — J440 Chronic obstructive pulmonary disease with acute lower respiratory infection: Secondary | ICD-10-CM | POA: Diagnosis not present

## 2023-12-07 DIAGNOSIS — Z7901 Long term (current) use of anticoagulants: Secondary | ICD-10-CM | POA: Diagnosis not present

## 2023-12-07 DIAGNOSIS — E1165 Type 2 diabetes mellitus with hyperglycemia: Secondary | ICD-10-CM | POA: Diagnosis not present

## 2023-12-07 DIAGNOSIS — R0602 Shortness of breath: Secondary | ICD-10-CM | POA: Diagnosis not present

## 2023-12-08 ENCOUNTER — Encounter: Payer: Self-pay | Admitting: Nurse Practitioner

## 2023-12-08 ENCOUNTER — Encounter (HOSPITAL_BASED_OUTPATIENT_CLINIC_OR_DEPARTMENT_OTHER): Payer: Self-pay | Admitting: Internal Medicine

## 2023-12-09 ENCOUNTER — Encounter (HOSPITAL_COMMUNITY): Payer: Self-pay

## 2023-12-09 ENCOUNTER — Ambulatory Visit: Payer: Self-pay | Admitting: Cardiology

## 2023-12-09 ENCOUNTER — Telehealth: Payer: Self-pay | Admitting: Internal Medicine

## 2023-12-09 ENCOUNTER — Telehealth (HOSPITAL_BASED_OUTPATIENT_CLINIC_OR_DEPARTMENT_OTHER): Payer: Self-pay | Admitting: Cardiology

## 2023-12-09 DIAGNOSIS — N179 Acute kidney failure, unspecified: Secondary | ICD-10-CM

## 2023-12-09 DIAGNOSIS — I5033 Acute on chronic diastolic (congestive) heart failure: Secondary | ICD-10-CM | POA: Insufficient documentation

## 2023-12-09 DIAGNOSIS — I214 Non-ST elevation (NSTEMI) myocardial infarction: Secondary | ICD-10-CM

## 2023-12-09 DIAGNOSIS — A419 Sepsis, unspecified organism: Secondary | ICD-10-CM | POA: Diagnosis not present

## 2023-12-09 DIAGNOSIS — R6520 Severe sepsis without septic shock: Secondary | ICD-10-CM | POA: Diagnosis not present

## 2023-12-09 DIAGNOSIS — R7881 Bacteremia: Secondary | ICD-10-CM

## 2023-12-09 DIAGNOSIS — N390 Urinary tract infection, site not specified: Secondary | ICD-10-CM | POA: Diagnosis not present

## 2023-12-09 DIAGNOSIS — R9389 Abnormal findings on diagnostic imaging of other specified body structures: Secondary | ICD-10-CM | POA: Diagnosis not present

## 2023-12-09 DIAGNOSIS — I459 Conduction disorder, unspecified: Secondary | ICD-10-CM | POA: Diagnosis not present

## 2023-12-09 DIAGNOSIS — R001 Bradycardia, unspecified: Secondary | ICD-10-CM | POA: Diagnosis not present

## 2023-12-09 DIAGNOSIS — R9431 Abnormal electrocardiogram [ECG] [EKG]: Secondary | ICD-10-CM | POA: Diagnosis not present

## 2023-12-09 NOTE — Telephone Encounter (Signed)
 ON-CALL CARDIOLOGY 12/09/23  Patient's name: Kelly Gibson.   MRN: [id number].    DOB: 02/13/1961 Primary care provider: Annella Kief, NP. Primary cardiologist: Dr. Maximo Spar   Requesting physician: Dr. Merlyn Starring [facility] in [place]) Cardiology on call: Dr. Albert Huff (at [facility])   Interaction regarding this patient's care today: Received a call from [facility] Ms. Tequila as Dr. Dia Forget hospitalist at [facility] in [place]  requesting transfer.  It appears that patient and her husband were recently traveling and are in [place]  when she presented to a local hospital due to difficulty breathing and vomiting.  Patient has been in the ICU until earlier this morning she was transferred to the hospitalist service.  Husband requesting transfer to [facility] for further evaluation and management.  Imaging reports reviewed in Care Everywhere: CT CHEST/ABDOMEN/PELVIS WO IV CONTRAST 1. Left basilar consolidation raising the possibility of pneumonia. Mild enlarged mediastinal lymph nodes noted, may be reactive in nature. 2. Cholelithiasis without definite evidence for cholecystitis. 3. Moderate left hydronephrosis with perinephric stranding. 1.6 cm stone along the left renal pelvis with the left renal smaller stones noted.  Echo 12/08/2023 -Echocardiogram 12/08/23;Normal left ventricular size with preserved systolic function. Estimated ejection fraction ~55%.Grade II Diastolic dysfunction is present.Mild concentric left ventricular hypertrophy. Abnormal regional wall motion- there is anteroapical hypokinesis.Mildly dilated left atrium.Moderate aortic valve calcification without any evidence of aortic stenosis.Mild tricuspid regurgitation, RVSP of ~31 mmHg consistent with normal pulmonary pressures .IVC dimension is < 2.1 cm and collapse more > 50% c/w CVP of 0-5 mmHg.Aortic root dimension within normal limits.   DVT study negative   CT PE study  12/08/2023 1. No evidence of pulmonary  embolism.  2. Stable masslike consolidation within the left lower lobe along with enlarged mediastinal and bilateral hilar lymph nodes. While these changes may be related to pneumonia and reactive lymphadenopathy, follow-up after treatment is required to ensure no residual mass lesion.  3. Cholelithiasis.   Currently being treated for sepsis due to bacteremia and urinary tract infection due to gram-negative organisms.  High sensitive troponins have been >60,000 for the last 6 checks  Serum creatinine is greater than 3 mg/dL  Lactic acidosis - which has improved.   Dr. Merlyn Starring informs me that the cardiology service there have no plans for angiography or pacemaker at this time given the bacteremia.  Patient is on broad-spectrum antibiotics.  No chest pain reported (per Dr. Merlyn Starring)   Husband wishes to leave with the patient AMA or be transferred to [facility] health.   Impression: Sepsis/bacteremia due to gram-negative organism. Urinary tract infection due to gram negative organism.  Abnormal CT chest -per report Stable masslike consolidation within the left lower lobe along with enlarged mediastinal and bilateral hilar lymph nodes. While these changes may be related to pneumonia and reactive lymphadenopathy, follow-up after treatment is required to ensure no residual mass lesion.  NSTEMI  Intermittent complete heart block, per EMR AKI   Recommendations: The phone call with Dr. Dia Forget which is mediated by [facility] was long and extensive after reviewing the limited records available in Care Everywhere.  Without interviewing and examining the patient following are my thoughts.  She has multiorgan involvement with regards to sepsis/bacteremia, acute kidney injury, elevated troponins >60,000 suggestive of possible completed infarct/late presentation, intermittent conduction disease noted on telemetry.  Given the bacteremia and conduction disease recommended when its medically safe to consider  transesophageal echocardiogram to rule out abscess.  It is not medically safe for the patient to  be discharged so that the husband can drive her to [facility].  Her ongoing issues are not only cardiac but multiorgan involvement.  She is currently under hospitalist service and I recommended that Dr. Merlyn Starring speak to hospitalist on staff here for the potential transfer. If and when she arrives cardiology will see in consultation.  Reiterated the importance of hemodynamic stability prior to transferring the patient for reasons mentioned above.  It is my understanding that Dr. Lydia Sams, hospitalist at [facility] health has accepted the patient.  We have also put the patient on our rounding list for when she arrives.   Telephone encounter total time: 35 minutes   Olinda Bertrand, DO, [facility]  [address] [place], [place] [address] 12/09/2023 7:24 PM

## 2023-12-09 NOTE — Telephone Encounter (Signed)
 Plan of Care Note for accepted transfer  Patient: Kelly Gibson    WGN:[id number]  DOA: (Not on file)     Nursing staff, Please call [facility] Admits & Consults System-Wide number on Amion as soon as patient's arrival to the unit (not the listed attending) so that the appropriate admitting provider can evaluate the pt. ASAP to avoid any delay in care.  Facility requesting transfer: [facility]/[place]  Requesting Provider: Dr. Merlyn Starring Reason for transfer: Admission at the request of family. Facility course: Patient presented to [facility] on 5/13 with complaints of shortness of breath and left-sided flank pain.  Admitted for acute respiratory failure with hypoxia secondary to COPD exacerbation pneumonia.  Treated with nebulizer therapy, IV antibiotic as well as BiPAP.  Did not require pressors but was admitted to ICU.  Along with that was also found to have AKI.  CT scan showed evidence of masslike consolidation on the left roughly 5 cm in size.  Patient was started on IV heparin for concern for non-STEMI.  Cardiology was consulted.  Troponin initially was 282, repeat troponin was 60,000. Along with that she was also found to have moderate left hydronephrosis with perinephric stranding and 1.6 cm stone in the left renal pelvis.  Blood culture grew E. coli.  CT PE protocol was negative for pulmonary embolism.  Transferred out of the ICU on 5/15. Cardiology recommended that no plan for revascularization and continue with heparin drip and antiplatelet therapy.  Echocardiogram showed preserved EF with anteroapical hypokinesis. On 5/15 she was found to have type II AV block with intermittent A-V dissociation/third-degree heart block.  Cardiologist note on 5/15 mentions "Tele was reviewed and showed sinus bradycardia with intermittent episodes of complete heart block. This is possibly due to her underlying sepsis and acute illness. Will need to reassess for the need of a  pacemaker after her infection is treated. Avoid any AV nodal blockers for now. "  Per report of provider from Dr. Merlyn Starring, husband is threatening to leave the hospital AMA and wants to drive the patient to [facility]. Therefore facility is attempting to transfer the patient to [facility]. Per report the patient is currently hemodynamically stable.  Last set of vital at 5:30 p.m. 12/09/23  HR 57, pulse 58.  Blood pressure 144/114.  RR 17.  O2 saturation 100% on 4 LPM.   Plan of care: The patient is accepted for admission to Progressive cardiac unit, at [facility]. Recommended transferring provider to ensure that patient is hemodynamically stable at the time of the transfer especially with regards to her intermittent complete heart block/A-V dissociation. Transferring provider has been informed about the current bed situation at the time of the call.  [facility] will notify them when a bed is available.  Pneumonia/COPD/possible lung mass will need more workup after review of the imaging. E. coli bacteremia currently on antibiotic per transferring provider Obstructive uropathy with left hydronephrosis and 1.6 cm stone will need urology consult on arrival AKI on CKD 3B baseline creatinine 1.39.  Last creatinine on 5/15 3.4.  May need nephrology consultation on arrival.  Non-STEMI currently on IV heparin.  Will require cardiology consult. Sinus bradycardia/A-V dissociation with intermittent third-degree heart block.  Recommended transferring provider to stabilize the patient if AV dissociation is persistent and patient is symptomatic.  Will require cardiology consult on arrival.  Author: Charlean Congress, MD  12/09/2023  Check [email or web address] for on-call coverage.

## 2023-12-09 NOTE — Telephone Encounter (Signed)
 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   I spoke to the husband today, his wife is currently in Cardiac ICU with possible Heart Attack, UTI and Breathing issues in [place] . Per husband, he is wanting to get her discharged Tomorrow (Friday) and bring her back home Saturday and take her straight to [facility]. He did not know about a plan that could be placed for her. I advised that the Doctor at the hospital in [place]  needed to contact [facility] Cardiac ICU and see about placement. They will travel by car and not through EMS or Air. I also advised husband that I am not sure if they would have to start the process over through ED once they get here. I gave him cardiac ICU number for the Doctor in [place]  to call as there was nothing we could currently do. All labs and notes are in our EPIC system. Tropinin level is 60,000.   Husband mentioned that the Doctor may call here as we are the PCP office.

## 2023-12-09 NOTE — Telephone Encounter (Signed)
 Copied from CRM [id number]. Topic: Clinical - Medical Advice >> Dec 09, 2023  8:23 AM Lotus Round B wrote: Reason for CRM: pt husband called in the patient is in a hospital out of town and he needs to speak with the Dr.Early or nurse Regarding some advise on what to do decision wise . If there is anyway someone can give him a call . Hope Ly [phone]

## 2023-12-11 ENCOUNTER — Inpatient Hospital Stay (HOSPITAL_COMMUNITY)

## 2023-12-11 ENCOUNTER — Inpatient Hospital Stay (HOSPITAL_COMMUNITY)
Admission: EM | Admit: 2023-12-11 | Discharge: 2023-12-14 | DRG: 871 | Disposition: A | Discharge status: Home Health | Source: Other Acute Inpatient Hospital | Attending: Family Medicine | Admitting: Family Medicine

## 2023-12-11 DIAGNOSIS — E1122 Type 2 diabetes mellitus with diabetic chronic kidney disease: Secondary | ICD-10-CM | POA: Diagnosis not present

## 2023-12-11 DIAGNOSIS — R109 Unspecified abdominal pain: Secondary | ICD-10-CM | POA: Diagnosis not present

## 2023-12-11 DIAGNOSIS — Z8249 Family history of ischemic heart disease and other diseases of the circulatory system: Secondary | ICD-10-CM

## 2023-12-11 DIAGNOSIS — I130 Hypertensive heart and chronic kidney disease with heart failure and stage 1 through stage 4 chronic kidney disease, or unspecified chronic kidney disease: Secondary | ICD-10-CM | POA: Diagnosis not present

## 2023-12-11 DIAGNOSIS — R7881 Bacteremia: Secondary | ICD-10-CM | POA: Diagnosis not present

## 2023-12-11 DIAGNOSIS — R918 Other nonspecific abnormal finding of lung field: Secondary | ICD-10-CM | POA: Diagnosis not present

## 2023-12-11 DIAGNOSIS — J441 Chronic obstructive pulmonary disease with (acute) exacerbation: Secondary | ICD-10-CM | POA: Diagnosis not present

## 2023-12-11 DIAGNOSIS — I69322 Dysarthria following cerebral infarction: Secondary | ICD-10-CM

## 2023-12-11 DIAGNOSIS — N184 Chronic kidney disease, stage 4 (severe): Secondary | ICD-10-CM | POA: Diagnosis not present

## 2023-12-11 DIAGNOSIS — Z7902 Long term (current) use of antithrombotics/antiplatelets: Secondary | ICD-10-CM

## 2023-12-11 DIAGNOSIS — E669 Obesity, unspecified: Secondary | ICD-10-CM | POA: Diagnosis present

## 2023-12-11 DIAGNOSIS — I495 Sick sinus syndrome: Secondary | ICD-10-CM

## 2023-12-11 DIAGNOSIS — Z823 Family history of stroke: Secondary | ICD-10-CM

## 2023-12-11 DIAGNOSIS — N132 Hydronephrosis with renal and ureteral calculous obstruction: Secondary | ICD-10-CM | POA: Diagnosis not present

## 2023-12-11 DIAGNOSIS — A4151 Sepsis due to Escherichia coli [E. coli]: Secondary | ICD-10-CM | POA: Diagnosis present

## 2023-12-11 DIAGNOSIS — Z833 Family history of diabetes mellitus: Secondary | ICD-10-CM

## 2023-12-11 DIAGNOSIS — J440 Chronic obstructive pulmonary disease with acute lower respiratory infection: Secondary | ICD-10-CM | POA: Diagnosis not present

## 2023-12-11 DIAGNOSIS — E785 Hyperlipidemia, unspecified: Secondary | ICD-10-CM | POA: Diagnosis present

## 2023-12-11 DIAGNOSIS — Z01810 Encounter for preprocedural cardiovascular examination: Secondary | ICD-10-CM

## 2023-12-11 DIAGNOSIS — N2889 Other specified disorders of kidney and ureter: Secondary | ICD-10-CM | POA: Diagnosis not present

## 2023-12-11 DIAGNOSIS — N179 Acute kidney failure, unspecified: Secondary | ICD-10-CM | POA: Diagnosis present

## 2023-12-11 DIAGNOSIS — F39 Unspecified mood [affective] disorder: Secondary | ICD-10-CM | POA: Diagnosis present

## 2023-12-11 DIAGNOSIS — Z87891 Personal history of nicotine dependence: Secondary | ICD-10-CM

## 2023-12-11 DIAGNOSIS — I214 Non-ST elevation (NSTEMI) myocardial infarction: Principal | ICD-10-CM | POA: Diagnosis present

## 2023-12-11 DIAGNOSIS — J189 Pneumonia, unspecified organism: Secondary | ICD-10-CM | POA: Diagnosis present

## 2023-12-11 DIAGNOSIS — I2510 Atherosclerotic heart disease of native coronary artery without angina pectoris: Secondary | ICD-10-CM | POA: Diagnosis not present

## 2023-12-11 DIAGNOSIS — Z888 Allergy status to other drugs, medicaments and biological substances status: Secondary | ICD-10-CM

## 2023-12-11 DIAGNOSIS — J9601 Acute respiratory failure with hypoxia: Secondary | ICD-10-CM | POA: Diagnosis not present

## 2023-12-11 DIAGNOSIS — I69351 Hemiplegia and hemiparesis following cerebral infarction affecting right dominant side: Secondary | ICD-10-CM | POA: Diagnosis not present

## 2023-12-11 DIAGNOSIS — N200 Calculus of kidney: Secondary | ICD-10-CM | POA: Diagnosis not present

## 2023-12-11 DIAGNOSIS — Z5309 Procedure and treatment not carried out because of other contraindication: Secondary | ICD-10-CM | POA: Diagnosis present

## 2023-12-11 DIAGNOSIS — Z794 Long term (current) use of insulin: Secondary | ICD-10-CM

## 2023-12-11 DIAGNOSIS — Z91040 Latex allergy status: Secondary | ICD-10-CM

## 2023-12-11 DIAGNOSIS — A419 Sepsis, unspecified organism: Secondary | ICD-10-CM | POA: Diagnosis not present

## 2023-12-11 DIAGNOSIS — I5033 Acute on chronic diastolic (congestive) heart failure: Secondary | ICD-10-CM | POA: Diagnosis present

## 2023-12-11 DIAGNOSIS — I442 Atrioventricular block, complete: Secondary | ICD-10-CM

## 2023-12-11 DIAGNOSIS — R0602 Shortness of breath: Secondary | ICD-10-CM | POA: Diagnosis not present

## 2023-12-11 DIAGNOSIS — Z5986 Financial insecurity: Secondary | ICD-10-CM

## 2023-12-11 DIAGNOSIS — Z7982 Long term (current) use of aspirin: Secondary | ICD-10-CM | POA: Diagnosis not present

## 2023-12-11 DIAGNOSIS — N189 Chronic kidney disease, unspecified: Secondary | ICD-10-CM | POA: Diagnosis not present

## 2023-12-11 DIAGNOSIS — Z743 Need for continuous supervision: Secondary | ICD-10-CM | POA: Diagnosis not present

## 2023-12-11 DIAGNOSIS — J410 Simple chronic bronchitis: Secondary | ICD-10-CM

## 2023-12-11 DIAGNOSIS — K8020 Calculus of gallbladder without cholecystitis without obstruction: Secondary | ICD-10-CM | POA: Diagnosis not present

## 2023-12-11 DIAGNOSIS — Z6834 Body mass index (BMI) 34.0-34.9, adult: Secondary | ICD-10-CM

## 2023-12-11 DIAGNOSIS — E872 Acidosis, unspecified: Secondary | ICD-10-CM | POA: Diagnosis not present

## 2023-12-11 DIAGNOSIS — Z7189 Other specified counseling: Secondary | ICD-10-CM

## 2023-12-11 DIAGNOSIS — M24541 Contracture, right hand: Secondary | ICD-10-CM | POA: Diagnosis not present

## 2023-12-11 DIAGNOSIS — E118 Type 2 diabetes mellitus with unspecified complications: Secondary | ICD-10-CM | POA: Diagnosis not present

## 2023-12-11 DIAGNOSIS — Z79899 Other long term (current) drug therapy: Secondary | ICD-10-CM

## 2023-12-11 DIAGNOSIS — W57XXXA Bitten or stung by nonvenomous insect and other nonvenomous arthropods, initial encounter: Secondary | ICD-10-CM | POA: Diagnosis present

## 2023-12-11 DIAGNOSIS — R059 Cough, unspecified: Secondary | ICD-10-CM | POA: Diagnosis not present

## 2023-12-11 DIAGNOSIS — J449 Chronic obstructive pulmonary disease, unspecified: Secondary | ICD-10-CM | POA: Diagnosis present

## 2023-12-11 DIAGNOSIS — Z881 Allergy status to other antibiotic agents status: Secondary | ICD-10-CM

## 2023-12-11 DIAGNOSIS — Z993 Dependence on wheelchair: Secondary | ICD-10-CM

## 2023-12-11 HISTORY — DX: Atrioventricular block, complete: I44.2

## 2023-12-11 HISTORY — DX: Non-ST elevation (NSTEMI) myocardial infarction: I21.4

## 2023-12-11 LAB — CBC WITH DIFFERENTIAL/PLATELET
Abs Immature Granulocytes: 0.16 10*3/uL — ABNORMAL HIGH (ref 0.00–0.07)
Basophils Absolute: 0 10*3/uL (ref 0.0–0.1)
Basophils Relative: 0 %
Eosinophils Absolute: 0 10*3/uL (ref 0.0–0.5)
Eosinophils Relative: 0 %
HCT: 42.3 % (ref 36.0–46.0)
Hemoglobin: 13.7 g/dL (ref 12.0–15.0)
Immature Granulocytes: 1 %
Lymphocytes Relative: 5 %
Lymphs Abs: 0.6 10*3/uL — ABNORMAL LOW (ref 0.7–4.0)
MCH: 27.6 pg (ref 26.0–34.0)
MCHC: 32.4 g/dL (ref 30.0–36.0)
MCV: 85.1 fL (ref 80.0–100.0)
Monocytes Absolute: 0.4 10*3/uL (ref 0.1–1.0)
Monocytes Relative: 3 %
Neutro Abs: 10.8 10*3/uL — ABNORMAL HIGH (ref 1.7–7.7)
Neutrophils Relative %: 91 %
Platelets: 266 10*3/uL (ref 150–400)
RBC: 4.97 MIL/uL (ref 3.87–5.11)
RDW: 15.5 % (ref 11.5–15.5)
WBC: 12 10*3/uL — ABNORMAL HIGH (ref 4.0–10.5)
nRBC: 0 % (ref 0.0–0.2)

## 2023-12-11 LAB — COMPREHENSIVE METABOLIC PANEL WITH GFR
ALT: 35 U/L (ref 0–44)
AST: 58 U/L — ABNORMAL HIGH (ref 15–41)
Albumin: 3.5 g/dL (ref 3.5–5.0)
Alkaline Phosphatase: 79 U/L (ref 38–126)
Anion gap: 18 — ABNORMAL HIGH (ref 5–15)
BUN: 56 mg/dL — ABNORMAL HIGH (ref 8–23)
CO2: 26 mmol/L (ref 22–32)
Calcium: 9.9 mg/dL (ref 8.9–10.3)
Chloride: 95 mmol/L — ABNORMAL LOW (ref 98–111)
Creatinine, Ser: 2.31 mg/dL — ABNORMAL HIGH (ref 0.44–1.00)
GFR, Estimated: 23 mL/min — ABNORMAL LOW (ref >60)
Glucose, Bld: 276 mg/dL — ABNORMAL HIGH (ref 70–99)
Potassium: 4.6 mmol/L (ref 3.5–5.1)
Sodium: 139 mmol/L (ref 135–145)
Total Bilirubin: 0.5 mg/dL (ref 0.0–1.2)
Total Protein: 8.3 g/dL — ABNORMAL HIGH (ref 6.5–8.1)

## 2023-12-11 LAB — TROPONIN I (HIGH SENSITIVITY)
Troponin I (High Sensitivity): 14726 ng/L (ref <18)
Troponin I (High Sensitivity): 15142 ng/L (ref <18)

## 2023-12-11 LAB — GLUCOSE, CAPILLARY: Glucose-Capillary: 279 mg/dL — ABNORMAL HIGH (ref 70–99)

## 2023-12-11 LAB — LACTIC ACID, PLASMA
Lactic Acid, Venous: 3 mmol/L (ref 0.5–1.9)
Lactic Acid, Venous: 2.3 mmol/L (ref 0.5–1.9)

## 2023-12-11 LAB — HEMOGLOBIN A1C
Hgb A1c MFr Bld: 8 % — ABNORMAL HIGH (ref 4.8–5.6)
Mean Plasma Glucose: 182.9 mg/dL

## 2023-12-11 LAB — HEPARIN LEVEL (UNFRACTIONATED): Heparin Unfractionated: 0.31 [IU]/mL (ref 0.30–0.70)

## 2023-12-11 LAB — HIV ANTIBODY (ROUTINE TESTING W REFLEX): HIV Screen 4th Generation wRfx: NONREACTIVE

## 2023-12-11 MED ORDER — CLOPIDOGREL BISULFATE 75 MG PO TABS
75.0000 mg | ORAL_TABLET | Freq: Every day | ORAL | Status: DC
  Filled 2023-12-11: qty 1

## 2023-12-11 MED ORDER — INSULIN GLARGINE-YFGN 100 UNIT/ML ~~LOC~~ SOLN
7.0000 [IU] | Freq: Two times a day (BID) | SUBCUTANEOUS | Status: DC
  Administered 2023-12-11 – 2023-12-14 (×6): 7 [IU] via SUBCUTANEOUS
  Filled 2023-12-11 (×7): qty 0.07

## 2023-12-11 MED ORDER — INSULIN ASPART 100 UNIT/ML IJ SOLN
0.0000 [IU] | Freq: Three times a day (TID) | INTRAMUSCULAR | Status: DC
  Administered 2023-12-12: 3 [IU] via SUBCUTANEOUS
  Administered 2023-12-12: 1 [IU] via SUBCUTANEOUS
  Administered 2023-12-12 – 2023-12-13 (×2): 2 [IU] via SUBCUTANEOUS
  Administered 2023-12-13 (×2): 3 [IU] via SUBCUTANEOUS

## 2023-12-11 MED ORDER — ONDANSETRON HCL 4 MG PO TABS: 4.0000 mg | ORAL_TABLET | Freq: Four times a day (QID) | ORAL | Status: DC | PRN

## 2023-12-11 MED ORDER — ACETAMINOPHEN 325 MG PO TABS: 650.0000 mg | ORAL_TABLET | Freq: Four times a day (QID) | ORAL | Status: DC | PRN

## 2023-12-11 MED ORDER — HEPARIN (PORCINE) 25000 UT/250ML-% IV SOLN
850.0000 [IU]/h | INTRAVENOUS | Status: AC
  Administered 2023-12-11: 650 [IU]/h via INTRAVENOUS
  Administered 2023-12-11: 700 [IU]/h via INTRAVENOUS

## 2023-12-11 MED ORDER — CITALOPRAM HYDROBROMIDE 10 MG PO TABS
10.0000 mg | ORAL_TABLET | Freq: Every day | ORAL | Status: DC
  Administered 2023-12-12 – 2023-12-14 (×3): 10 mg via ORAL
  Filled 2023-12-11 (×3): qty 1

## 2023-12-11 MED ORDER — ACETAMINOPHEN 650 MG RE SUPP: 650.0000 mg | Freq: Four times a day (QID) | RECTAL | Status: DC | PRN

## 2023-12-11 MED ORDER — POLYETHYLENE GLYCOL 3350 17 G PO PACK: 17.0000 g | PACK | Freq: Every day | ORAL | Status: DC | PRN

## 2023-12-11 MED ORDER — ASPIRIN 81 MG PO TBEC
81.0000 mg | DELAYED_RELEASE_TABLET | Freq: Every day | ORAL | Status: DC
  Administered 2023-12-12 – 2023-12-14 (×3): 81 mg via ORAL
  Filled 2023-12-11 (×3): qty 1

## 2023-12-11 MED ORDER — ROSUVASTATIN CALCIUM 20 MG PO TABS
40.0000 mg | ORAL_TABLET | Freq: Every day | ORAL | Status: DC
  Administered 2023-12-11 – 2023-12-13 (×3): 40 mg via ORAL
  Filled 2023-12-11 (×3): qty 2

## 2023-12-11 MED ORDER — CEFAZOLIN SODIUM-DEXTROSE 2-4 GM/100ML-% IV SOLN
2.0000 g | Freq: Two times a day (BID) | INTRAVENOUS | Status: DC
  Administered 2023-12-11 – 2023-12-12 (×2): 2 g via INTRAVENOUS
  Filled 2023-12-11 (×3): qty 100

## 2023-12-11 MED ORDER — POLYETHYLENE GLYCOL 3350 17 G PO PACK
17.0000 g | PACK | Freq: Two times a day (BID) | ORAL | Status: DC
  Administered 2023-12-11 – 2023-12-13 (×5): 17 g via ORAL
  Filled 2023-12-11 (×6): qty 1

## 2023-12-11 MED ORDER — ALBUTEROL SULFATE (2.5 MG/3ML) 0.083% IN NEBU: 2.5000 mg | INHALATION_SOLUTION | RESPIRATORY_TRACT | Status: DC | PRN

## 2023-12-11 MED ORDER — NITROGLYCERIN 0.4 MG SL SUBL: 0.4000 mg | SUBLINGUAL_TABLET | SUBLINGUAL | Status: DC | PRN

## 2023-12-11 MED ORDER — LINAGLIPTIN 5 MG PO TABS
5.0000 mg | ORAL_TABLET | Freq: Every day | ORAL | Status: DC
  Administered 2023-12-12 – 2023-12-14 (×3): 5 mg via ORAL
  Filled 2023-12-11 (×3): qty 1

## 2023-12-11 MED ORDER — MELATONIN 5 MG PO TABS
5.0000 mg | ORAL_TABLET | Freq: Every evening | ORAL | Status: DC | PRN
  Administered 2023-12-11 – 2023-12-13 (×3): 5 mg via ORAL
  Filled 2023-12-11 (×3): qty 1

## 2023-12-11 MED ORDER — INSULIN ASPART 100 UNIT/ML IJ SOLN
0.0000 [IU] | Freq: Every day | INTRAMUSCULAR | Status: DC
Start: 2023-12-11 — End: 2023-12-13
  Administered 2023-12-11: 3 [IU] via SUBCUTANEOUS
  Administered 2023-12-12: 2 [IU] via SUBCUTANEOUS

## 2023-12-11 MED ORDER — ONDANSETRON HCL 4 MG/2ML IJ SOLN: 4.0000 mg | Freq: Four times a day (QID) | INTRAMUSCULAR | Status: DC | PRN

## 2023-12-11 MED ORDER — INSULIN GLARGINE-YFGN 100 UNIT/ML ~~LOC~~ SOLN
10.0000 [IU] | Freq: Two times a day (BID) | SUBCUTANEOUS | Status: DC
Start: 2023-12-11 — End: 2023-12-11

## 2023-12-11 MED ORDER — BUDESON-GLYCOPYRROL-FORMOTEROL 160-9-4.8 MCG/ACT IN AERO
2.0000 | INHALATION_SPRAY | Freq: Two times a day (BID) | RESPIRATORY_TRACT | Status: DC
  Administered 2023-12-11 – 2023-12-14 (×6): 2 via RESPIRATORY_TRACT
  Filled 2023-12-11: qty 5.9

## 2023-12-11 NOTE — Progress Notes (Addendum)
 ANTICOAGULATION CONSULT NOTE  Pharmacy Consult for heparin Indication: chest pain/ACS  Allergies  Allergen Reactions   Ciprofloxacin Other (See Comments)   Niacin Nausea And Vomiting   Niacin And Related Rash    Patient Measurements: Height: 5\' 3"  (160 cm) Weight: 88.4 kg (194 lb 14.2 oz) IBW/kg (Calculated) : 52.4 Heparin Dosing Weight: 72 kg  Vital Signs:    Labs: No results for input(s): "HGB", "HCT", "PLT", "APTT", "LABPROT", "INR", "HEPARINUNFRC", "HEPRLOWMOCWT", "CREATININE", "CKTOTAL", "CKMB", "TROPONINIHS" in the last 72 hours.  CrCl cannot be calculated (Patient's most recent lab result is older than the maximum 21 days allowed.).  Medical History: Past Medical History:  Diagnosis Date   Bradycardia    SR/SB with up to 3.5 second pause (asymptomatic) on ~ 10/2017 event monitor; saw EP Dr. Nunzio Belch: avoid AV nodal blocking agents   COPD (chronic obstructive pulmonary disease) (HCC)    Depression    Depression with anxiety 08/29/2021   Diabetes mellitus    Dysarthria, post-stroke    Ganglion cyst 03/09/2012   Hyperlipidemia    Hypertension    Hypokalemia    Obesity, unspecified 12/17/2011   PONV (postoperative nausea and vomiting)    Skin lesion of face 11/17/2022   Stroke [facility])     Medications:  See MAR  Assessment: 63 yo female transferred from [facility] with NSTEMI and CHB for cath and possible PPM, heparin running at 650 units/hr (last aPTT collected 5/16 AM was 90 sec, therapeutic).  Not on anticoagulation prior to admission.  Pharmacy consulted for heparin dosing.  -Per RN, heparin running at 1350 units/hr (25000 units/500 mL) upon transfer and was switched to 650 units (250000 units/250 mL - our standard concentration). -STAT heparin level ordered.   Goal of Therapy:  Heparin level 0.3-0.7 units/ml Monitor platelets by anticoagulation protocol: Yes   Plan:  Continue heparin IV 650 units/hr STAT anti-Xa level Daily CBC, anti-Xa level Monitor for s/sx  of bleeding  ADDENDUM - heparin level 0.31, therapeutic on 650 units/hr.  Increase slightly to 700 units/hr to keep therapeutic.  F/u level with AM labs (~8h level).  Cecillia Cogan, PharmD Clinical Pharmacist 12/11/2023  6:32 PM

## 2023-12-11 NOTE — Progress Notes (Signed)
 Informed by lab of critical Lactic Acid of 3 mmol/L. Notified Dr. Del Favia. Will continue to monitor and I will notify the doctor of the next value.

## 2023-12-11 NOTE — Progress Notes (Addendum)
 PHARMACY ANTIBIOTIC CONSULT NOTE   Kelly Gibson a 63 y.o. female transferred from [place]  hospital with E. Coli UTI/bacteremia.  Pharmacy has been consulted for Ancef  dosing.  -SCr 3.2 on 5/16 - est CrCl 25 mL/min -E coli (pan-sensitive) in blood and urine per outside hospital records (BCx drawn 5/14, UCx drawn 5/16, repeat BCx collected but were still pending).  Started on Rocephin 5/14.   -Of note, she was being worked up for tick-borne illness (had a tick on her side which was removed at [facility]). Received 3 doses of doxycycline  (last 5/16 PM).  CrCl cannot be calculated (Patient's most recent lab result is older than the maximum 21 days allowed.).  Plan: START Ancef  2g IV Q12h Monitor renal function, clinical status, C/S  Allergies:  Allergies  Allergen Reactions   Ciprofloxacin Other (See Comments)   Niacin Nausea And Vomiting   Niacin And Related Rash    Filed Weights   12/11/23 1800  Weight: 88.4 kg (194 lb 14.2 oz)       Latest Ref Rng & Units 10/05/2022    2:08 PM 08/29/2021    3:31 PM 11/04/2020    2:43 PM  CBC  WBC 3.4 - 10.8 x10E3/uL 6.1  6.6  5.6   Hemoglobin 11.1 - 15.9 g/dL 09.8  11.9  14.7   Hematocrit 34.0 - 46.6 % 39.3  39.7  39.7   Platelets 150 - 450 x10E3/uL 310  318  286     Antibiotics Given (last 72 hours)     None       Antimicrobials this admission: CRO 5/14 ([facility]) > 5/16 Ancef  5/17 > c  Microbiology results: 5/16 OSH UCx: >100k E. Coli (pan-S) 5/14 BCx: E coli (pan-S) 5/14 MRSA PCR: positive  Thank you for allowing pharmacy to be a part of this patient's care.  Cecillia Cogan, PharmD Clinical Pharmacist 12/11/2023  6:27 PM

## 2023-12-11 NOTE — H&P (Addendum)
 History and Physical    BRADLEY HANDYSIDE WUJ:[id number] DOB: [birth year] DOA: 12/11/2023  PCP: Annella Kief, NP  Patient coming from: home  I have personally briefly reviewed patient's old medical records in [facility]  Chief Complaint: transfer from [facility] in [place]   HPI: Kelly Gibson is Kelly Gibson 63 y.o. female with medical history significant of stroke with chronic R sided weakness   History is obtained from the patient and review of previous records/osh information.  Of note, there's no discharge summary in the paperwork we received or in care everywhere.  Her symptoms started on Tuesday, 5/13.  She describes feeling like she was having Darrin Koman COPD exacerbation.  Her husband notes she tried Mackenzie Lia few puffs of her inhaler, but had progressive shortness of breath which led to her hospitalization.  She denied CP, fevers, abdominal pain, dysuria.  She had chills.    She was admitted for AHRF initially requiring bipap.  She was treated for COPD exacerbation and pneumonia.  Initially admitted to the ICU.  She was found to have an NSTEMI and acute kidney injury.  Sounds like they were planning for Rafiq Bucklin transfer for heart catherization, but she was found to have E. Coli bacteremia.  She's been treated medically for her NSTEMI with heparin/plavix/aspirin .  She was transferred out of the ICU on 5/15.  She was noted to have intermittent episodes of complete heart block as well, pacemaker was deferred until her infection was treated.   She was transferred to [facility] on 5/17 for additional care.  See prior notes, care everywhere for additional details.  Review of Systems: As per HPI otherwise all other systems reviewed and are negative.  Past Medical History:  Diagnosis Date   Bradycardia    SR/SB with up to 3.5 second pause (asymptomatic) on ~ 10/2017 event monitor; saw EP Dr. Nunzio Belch: avoid AV nodal blocking agents   COPD (chronic obstructive pulmonary disease) (HCC)    Depression     Depression with anxiety 08/29/2021   Diabetes mellitus    Dysarthria, post-stroke    Ganglion cyst 03/09/2012   Hyperlipidemia    Hypertension    Hypokalemia    Obesity, unspecified 12/17/2011   PONV (postoperative nausea and vomiting)    Skin lesion of face 11/17/2022   Stroke [facility])     Past Surgical History:  Procedure Laterality Date   CESAREAN SECTION     x 3   ORIF WRIST FRACTURE Right 05/04/2018   Procedure: OPEN REDUCTION INTERNAL FIXATION (ORIF) RIGHT  WRIST FRACTURE;  Surgeon: Micheline Ahr, MD;  Location: [facility] OR;  Service: Orthopedics;  Laterality: Right;   TUMOR REMOVAL     left shoulder    Social History  reports that she has quit smoking. Her smoking use included cigarettes. She has Calyb Mcquarrie 20 pack-year smoking history. She has never used smokeless tobacco. She reports that she does not drink alcohol and does not use drugs.  Allergies  Allergen Reactions   Ciprofloxacin Other (See Comments)   Niacin Nausea And Vomiting   Niacin And Related Rash    Family History  Problem Relation Age of Onset   Diabetes Mother    Stroke Mother    Heart disease Father    Cancer Father    Stroke Father    Prior to Admission medications   Medication Sig Start Date End Date Taking? Authorizing Provider  albuterol  (VENTOLIN  HFA) 108 (90 Base) MCG/ACT inhaler Inhale 2 puffs into the lungs every 6 (  six) hours as needed for wheezing or shortness of breath. 08/16/23   Early, Sara E, NP  amLODipine  (NORVASC ) 5 MG tablet Take 1 tablet (5 mg total) by mouth daily. 05/12/23   Early, Sara E, NP  aspirin  EC 325 MG EC tablet Take 1 tablet (325 mg total) by mouth daily. 12/10/17   Vann, Jessica U, DO  Budeson-Glycopyrrol-Formoterol (BREZTRI  AEROSPHERE) 160-9-4.8 MCG/ACT AERO Inhale 2 puffs into the lungs 2 (two) times daily. 04/25/23   Early, Sara E, NP  citalopram  (CELEXA ) 10 MG tablet TAKE 1 TABLET BY MOUTH DAILY 06/28/23   Early, Sara E, NP  clotrimazole -betamethasone  (LOTRISONE ) cream Apply 1  application topically 2 (two) times daily. 04/11/19   Mathis Som, MD  COMIRNATY syringe  07/02/23   [provider]  Continuous Glucose Sensor (DEXCOM G7 SENSOR) MISC USE ONE SENSORE EVERY 10 DAYS    [provider]  furosemide  (LASIX ) 20 MG tablet Take 1 tablet (20 mg total) by mouth daily as needed. 10/05/22   Early, Sara E, NP  Glucose Blood (BLOOD GLUCOSE TEST STRIPS) STRP Use as directed to monitor blood sugar up to 4x daily. Dx: E11.9. 10/05/22   Early, Adriane Albe, NP  HUMALOG  KWIKPEN 100 UNIT/ML KwikPen INJECT 3 TO 5 UNITS INTO THE SKIN DAILY BEFORE LUNCH Patient taking differently: Inject 3-5 Units into the skin daily with lunch. 11/29/23   Early, Sara E, NP  insulin  degludec (TRESIBA  FLEXTOUCH) 100 UNIT/ML FlexTouch Pen Inject 10 Units into the skin 2 (two) times daily. Adjust by 2 units every 3 days as needed for blood sugars higher than 150 in the morning fasting. 10/05/22   Early, Sara E, NP  Insulin  Pen Needle (DROPLET PEN NEEDLES) 32G X 4 MM MISC 5/day for 90 days    [provider]  ketoconazole (NIZORAL) 2 % cream Apply topically 2 (two) times daily. 02/17/23   [provider]  Lancets 28G MISC 1 Units by Does not apply route 4 (four) times daily as needed. 10/05/22   Early, Sara E, NP  lidocaine -prilocaine  (EMLA ) cream Apply 1 application topically as needed. 10/08/20   McDonald, Olive Better, DPM  mupirocin  ointment (BACTROBAN ) 2 % Apply topically daily. For lesion on forehead. 11/16/22   Early, Sara E, NP  olmesartan (BENICAR) 40 MG tablet Take 40 mg by mouth daily. 05/12/23   [provider]  rosuvastatin  (CRESTOR ) 40 MG tablet TAKE 1 TABLET BY MOUTH DAILY 04/08/23   Early, Sara E, NP  tiZANidine  (ZANAFLEX ) 2 MG tablet TAKE 1-2 TABLETS UP TO TWICE Isabella Ida DAY  AS NEEDED FOR MUSCLE SPASMS 05/11/23   Early, Sara E, NP  TRADJENTA 5 MG TABS tablet Take 5 mg by mouth daily.    [provider]    Physical Exam: Vitals:   12/11/23 1800  Weight: 88.4  kg  Height: 5\' 3"  (1.6 m)    Constitutional: NAD, calm, comfortable Vitals:   12/11/23 1800  Weight: 88.4 kg  Height: 5\' 3"  (1.6 m)   Eyes: PERRL, lids and conjunctivae normal ENMT: Mucous membranes are moist.  Neck: normal, supple Respiratory: unlabored, CTAB Cardiovascular: bradycardic  Abdomen: mild TTP, soft Musculoskeletal: no clubbing / cyanosis. No joint deformity upper and lower extremities. Good ROM, no contractures. Normal muscle tone.  Skin: no visible rashes, lesions, ulcers.  Neurologic: chronic R sided weakness Psychiatric: Normal judgment and insight. Alert and oriented x 3. Normal mood.   Labs on Admission: I have personally reviewed following labs and imaging studies  CBC: No results for input(s): "WBC", "NEUTROABS", "HGB", "HCT", "MCV", "PLT" in the last 168 hours.  Basic Metabolic Panel: No results for input(s): "NA", "K", "CL", "CO2", "GLUCOSE", "BUN", "CREATININE", "CALCIUM ", "MG", "PHOS" in the last 168 hours.  GFR: CrCl cannot be calculated (Patient's most recent lab result is older than the maximum 21 days allowed.).  Liver Function Tests: No results for input(s): "AST", "ALT", "ALKPHOS", "BILITOT", "PROT", "ALBUMIN" in the last 168 hours.  Urine analysis:    Component Value Date/Time   COLORURINE YELLOW 05/18/2019 1430   APPEARANCEUR HAZY (Quiana Cobaugh) 05/18/2019 1430   LABSPEC 1.016 05/18/2019 1430   PHURINE 6.0 05/18/2019 1430   GLUCOSEU NEGATIVE 05/18/2019 1430   HGBUR MODERATE (Latrenda Irani) 05/18/2019 1430   BILIRUBINUR NEGATIVE 05/18/2019 1430   KETONESUR NEGATIVE 05/18/2019 1430   PROTEINUR 100 (Tyrelle Raczka) 05/18/2019 1430   UROBILINOGEN 0.2 01/26/2007 1410   NITRITE NEGATIVE 05/18/2019 1430   LEUKOCYTESUR NEGATIVE 05/18/2019 1430    Radiological Exams on Admission: DG CHEST PORT 1 VIEW Result Date: 12/11/2023 CLINICAL DATA:  10026 Shortness of breath 10026 EXAM: PORTABLE CHEST 1 VIEW COMPARISON:  May 10, 2023 FINDINGS: The cardiomediastinal silhouette is  unchanged and enlarged in contour.There is fullness of the RIGHT paratracheal stripe no pleural effusion. No pneumothorax. Band like opacities in the bilateral perihilar regions, most likely atelectasis. IMPRESSION: There is fullness of the RIGHT paratracheal stripe. This could reflect adenopathy versus summation artifact. Recommend repeat PA and lateral chest radiograph for improved evaluation when clinically appropriate. Electronically Signed   By: Clancy Crimes M.D.   On: 12/11/2023 17:35    EKG: Independently reviewed. pending  Assessment/Plan Principal Problem:   NSTEMI (non-ST elevated myocardial infarction) (HCC) Active Problems:   COPD (chronic obstructive pulmonary disease) (HCC)   Complete heart block (HCC)   Bacteremia due to Escherichia coli   Assessment and Plan:  No discharge summary in transfer paperwork Reviewed most recent progress notes (ID, cardiology, and hospitalist) that were sent down and over care everywhere.  See care everywhere for detailed reports of labs/imaging/other studies done at OSH.   NSTEMI Troponin peaked >60,000  5/16 troponin 39,631  Echo 5/14 with EF 55%, grade II diastolic dysfunction, anteroapical hypokinesis (see care everywhere report) Repeat troponins pending today Continue heparin gtt/aspirin /plavix for now Crestor  nitroglycerin  prn chest pain Avoiding beta blocker due to bradycardia below and hx COPD per OSH cardiologist Will consult cardiology here, revascularization on hold due to treatment of bacteremia  Sinus Bradycardia Intermittent Complete Heart Block Per OSH patient had intermittent episodes of complete heart block Plan was to reassess need for pacemaker after infection treated, hold any AV nodal blockers Cardiology consult   Sepsis due to E. Coli Bacteremia due to UTI with obstructing stone  Blood cultures from 5/14 notable for e. Coli, cefazolin  sensitive Urine culture with e. Coli  CT chest abd/pelvis with moderate L  hydro - 1.6 cm stone alone L renal pelvis  Will repeat renal US , consult urology  AKI on CKD IV Unclear baseline creatinine, in 2024, creatinine was 2.35 Creatinine 3.2 5/16 As above, moderate hydro on CT abd/pelvis Will repeat CT and consult urology  Acute Hypoxic Respiratory Failure COPD Exacerbation Community Acquired Pneumonia CT on 5/14 with left basilar consolidation concerning for pneumonia CT PE protocol with "masslike consolidation within the LLL along with enlarged mediastinal and bilateral hilar LN's" - needs follow up to resolution to ensure no mass lesion  She was treated for COPD exacerbation at presentation No si/sx exacerbation now Wean O2  as able   HFpEF with Exacerbation Was diuresed at the OSH for concern for volume overload She appears euvolemic at this time Echo with preserved EF, grade 2 diastolic dysfunction, anteroapical hypokinesis (see care everywhere) Strict I/O, daily weights  Hx Stroke with Chronic R Sided Weakness Noted  Hypertension Hold amlodipine , lasix , benicar for now (awaiting vitals and labs)  T2DM Takes 22 units long acting in morning, 10 units at night and SSI for short acting Tradjenta SSI  Mood Disorder celexa   Apparently had tick on her side which was removed in the hospital, low suspicion that this is contributing to her current presentation -> NSTEMI and sepsis due e. Coli bacteremia in the setting of an obstructing stone.  They mentioned "being worked up for tick borne illness", but I don't see any labs (western blot) - they maybe pending?  Follow in care everywhere.  She received doxy at OSH, will d/c for now (she got 3 doses 100 mg, should be adequate for ppx dose which is 200 mg x1).     DVT prophylaxis: Heparin gtt  Code Status:   full  Family Communication:  husband  Disposition Plan:   Patient is from:  [place]  transfer  Anticipated DC to:  pending  Anticipated DC date:  home  Anticipated DC  barriers: pending  Consults called:  Urology Jarvis Mesa), cards (henderson)  Admission status:  inpatient   Severity of Illness: The appropriate patient status for this patient is INPATIENT. Inpatient status is judged to be reasonable and necessary in order to provide the required intensity of service to ensure the patient's safety. The patient's presenting symptoms, physical exam findings, and initial radiographic and laboratory data in the context of their chronic comorbidities is felt to place them at high risk for further clinical deterioration. Furthermore, it is not anticipated that the patient will be medically stable for discharge from the hospital within 2 midnights of admission.   * I certify that at the point of admission it is my clinical judgment that the patient will require inpatient hospital care spanning beyond 2 midnights from the point of admission due to high intensity of service, high risk for further deterioration and high frequency of surveillance required.Donnetta Gains MD [facility]  How to contact the [facility] Attending or Consulting provider 7A - 7P or covering provider during after hours 7P -7A, for this patient?   Check the care team in [facility] and look for Tiajuana Leppanen) attending/consulting [facility] provider listed and b) the [facility] team listed Log into [email or web address] and use [place]'s universal password to access. If you do not have the password, please contact the hospital operator. Locate the [facility] provider you are looking for under [facility] and page to Alhaji Mcneal number that you can be directly reached. If you still have difficulty reaching the provider, please page the [facility] (Director on Call) for the Hospitalists listed on amion for assistance.  12/11/2023, 6:21 PM

## 2023-12-11 NOTE — Consult Note (Signed)
 Cardiology Consultation   Patient ID: Kelly Gibson MRN: [id number]; DOB: Dec 06, 1960  Admit date: 12/11/2023 Date of Consult: 12/11/2023  PCP:  Annella Kief, NP   [facility] Providers Cardiologist:  Hazle Lites, MD        Patient Profile:   Kelly Gibson is a 63 y.o. female with a significant past medical history of asymptomatic bradycardia with sinus pauses during sleep, likely recent myocardial infarction, prior cerebrovascular accidents, hypertension, type II diabetes, COPD,  who is being seen 12/11/2023 for the evaluation of likely recent myocardial infarction and possible paroxysmal heart block at the request of Dr. Ada Acres.  History of Present Illness:   Kelly Gibson reports while in [place]  visiting, she suddenly noticed shortness of breath with a mild cough.  She did notice some chest pain with deep breaths and cough.  Patient takes inhalers at home for her COPD for which she states did not help her breathing.  Due to shortness of breath not improving, she went to a local emergency department in [place]  for further care.  During he hospitalization, she reports that her shortness of breath eventually got better with the medications given during her hospitalization.  She is unable to recall any additional episodes of chest pain beyond chest discomfort with coughing while hospitalized.  She has noticed periodic lightheadedness but denies dizziness, fainting, or feeling like she was going to faint.  At this time, she reports feeling overall well.  She ultimately did not want to stay in [place]  and requested to be transferred to our hospital for further care.    Hospitalization Summary:  Patient initially presented to [facility] on 12/08/23 complaining of shortness of breath.  She was noted to have acute hypoxic respiratory failure initially requiring BiPap and unresponsive to nebulizer treatment for COPD.  Chest CT angiography ruled out pulmonary  embolism but left basilar consolidation was concerning for likely pneumonia.  Initial HS-troponin was 282 ng/L.  Initial echocardiogram done on 12/08/23 demonstrated normal left ventricular function (EF ~55%).  Patient eventually developed what appears to be sepsis with septic shock with noted bacteremia with E. Coli, thought secondary to urosepsis for which she was placed on IV antibiotics.  Subsequent repeat HS-troponin went as high as >60k ng/L, for which patient was managed as NSTEMI-ACS.  She was placed on heparin drip, aspirin , and clopidogrel.  Given sepsis, it was opted not to pursue urgent coronary angiography given relative contra-indication.  It appears during her hospital course on 12/10/23, telemetry demonstrated sinus bradycardia between 40 bpm to 50 bpm with occasional sinus pauses.  Per cardiology consult note, concerning for intermittent complete heart block.    Patient's husband wanted patient transferred to our facility for further care.  Case was discussed with Dr. Eden Goodpasture prior to transfer to our facility.     Past Medical History:  Diagnosis Date   Bradycardia    SR/SB with up to 3.5 second pause (asymptomatic) on ~ 10/2017 event monitor; saw EP Dr. Nunzio Belch: avoid AV nodal blocking agents   COPD (chronic obstructive pulmonary disease) (HCC)    Depression    Depression with anxiety 08/29/2021   Diabetes mellitus    Dysarthria, post-stroke    Ganglion cyst 03/09/2012   Hyperlipidemia    Hypertension    Hypokalemia    Obesity, unspecified 12/17/2011   PONV (postoperative nausea and vomiting)    Skin lesion of face 11/17/2022   Stroke [facility])     Past Surgical History:  Procedure Laterality Date   CESAREAN SECTION     x 3   ORIF WRIST FRACTURE Right 05/04/2018   Procedure: OPEN REDUCTION INTERNAL FIXATION (ORIF) RIGHT  WRIST FRACTURE;  Surgeon: Micheline Ahr, MD;  Location: [facility] OR;  Service: Orthopedics;  Laterality: Right;   TUMOR REMOVAL     left shoulder     Home  Medications:  Prior to Admission medications   Medication Sig Start Date End Date Taking? Authorizing Provider  albuterol  (VENTOLIN  HFA) 108 (90 Base) MCG/ACT inhaler Inhale 2 puffs into the lungs every 6 (six) hours as needed for wheezing or shortness of breath. 08/16/23   Early, Sara E, NP  amLODipine  (NORVASC ) 5 MG tablet Take 1 tablet (5 mg total) by mouth daily. 05/12/23   Early, Sara E, NP  aspirin  EC 325 MG EC tablet Take 1 tablet (325 mg total) by mouth daily. 12/10/17   Vann, Jessica U, DO  Budeson-Glycopyrrol-Formoterol (BREZTRI  AEROSPHERE) 160-9-4.8 MCG/ACT AERO Inhale 2 puffs into the lungs 2 (two) times daily. 04/25/23   Early, Sara E, NP  citalopram  (CELEXA ) 10 MG tablet TAKE 1 TABLET BY MOUTH DAILY 06/28/23   Early, Sara E, NP  clotrimazole -betamethasone  (LOTRISONE ) cream Apply 1 application topically 2 (two) times daily. 04/11/19   Mathis Som, MD  COMIRNATY syringe Inject 0.3 mLs into the muscle once. 07/02/23   [provider]  Continuous Glucose Sensor (DEXCOM G7 SENSOR) MISC USE ONE SENSORE EVERY 10 DAYS    [provider]  furosemide  (LASIX ) 20 MG tablet Take 1 tablet (20 mg total) by mouth daily as needed. 10/05/22   Early, Sara E, NP  Glucose Blood (BLOOD GLUCOSE TEST STRIPS) STRP Use as directed to monitor blood sugar up to 4x daily. Dx: E11.9. 10/05/22   Early, Adriane Albe, NP  HUMALOG  KWIKPEN 100 UNIT/ML KwikPen INJECT 3 TO 5 UNITS INTO THE SKIN DAILY BEFORE LUNCH Patient taking differently: Inject 3-5 Units into the skin daily with lunch. 11/29/23   Early, Sara E, NP  insulin  degludec (TRESIBA  FLEXTOUCH) 100 UNIT/ML FlexTouch Pen Inject 10 Units into the skin 2 (two) times daily. Adjust by 2 units every 3 days as needed for blood sugars higher than 150 in the morning fasting. 10/05/22   Early, Sara E, NP  Insulin  Pen Needle (DROPLET PEN NEEDLES) 32G X 4 MM MISC 5/day for 90 days    [provider]  ketoconazole (NIZORAL) 2 % cream Apply topically 2 (two)  times daily. 02/17/23   [provider]  Lancets 28G MISC 1 Units by Does not apply route 4 (four) times daily as needed. 10/05/22   Early, Sara E, NP  lidocaine -prilocaine  (EMLA ) cream Apply 1 application topically as needed. 10/08/20   McDonald, Olive Better, DPM  mupirocin  ointment (BACTROBAN ) 2 % Apply topically daily. For lesion on forehead. 11/16/22   Early, Sara E, NP  olmesartan (BENICAR) 40 MG tablet Take 40 mg by mouth daily. 05/12/23   [provider]  rosuvastatin  (CRESTOR ) 40 MG tablet TAKE 1 TABLET BY MOUTH DAILY 04/08/23   Early, Sara E, NP  tiZANidine  (ZANAFLEX ) 2 MG tablet TAKE 1-2 TABLETS UP TO TWICE A DAY  AS NEEDED FOR MUSCLE SPASMS 05/11/23   Early, Sara E, NP  TRADJENTA 5 MG TABS tablet Take 5 mg by mouth daily.    [provider]    Inpatient Medications: Scheduled Meds:  aspirin  EC  81 mg Oral Daily   budesonide-glycopyrrolate-formoterol  2 puff Inhalation BID   [  START ON 12/12/2023] citalopram   10 mg Oral Daily   clopidogrel  75 mg Oral Daily   insulin  aspart  0-5 Units Subcutaneous QHS   [START ON 12/12/2023] insulin  aspart  0-9 Units Subcutaneous TID WC   insulin  glargine-yfgn  7 Units Subcutaneous BID   [START ON 12/12/2023] linagliptin  5 mg Oral Daily   polyethylene glycol  17 g Oral BID   rosuvastatin   40 mg Oral QHS   Continuous Infusions:   ceFAZolin  (ANCEF ) IV     heparin 700 Units/hr (12/11/23 1948)   PRN Meds: acetaminophen  **OR** acetaminophen , albuterol , nitroGLYCERIN , ondansetron  **OR** ondansetron  (ZOFRAN ) IV  Allergies:    Allergies  Allergen Reactions   Ciprofloxacin Other (See Comments)   Niacin Nausea And Vomiting   Niacin And Related Rash    Social History:   Social History   Socioeconomic History   Marital status: Married    Spouse name: Not on file   Number of children: Not on file   Years of education: Not on file   Highest education level: Not on file  Occupational History   Not on file  Tobacco Use    Smoking status: Former    Current packs/day: 1.00    Average packs/day: 1 pack/day for 20.0 years (20.0 ttl pk-yrs)    Types: Cigarettes   Smokeless tobacco: Never   Tobacco comments:    stopped 2018  Vaping Use   Vaping status: Never Used  Substance and Sexual Activity   Alcohol use: No   Drug use: No   Sexual activity: Not on file  Other Topics Concern   Not on file  Social History Narrative   Not on file   Social Drivers of Health   Financial Resource Strain: Medium Risk (04/08/2023)   Overall Financial Resource Strain (CARDIA)    Difficulty of Paying Living Expenses: Somewhat hard  Food Insecurity: No Food Insecurity (12/08/2023)   Received from [facility]   Hunger Vital Sign    Worried About Running Out of Food in the Last Year: Never true    Ran Out of Food in the Last Year: Never true  Transportation Needs: No Transportation Needs (12/08/2023)   Received from [facility]   PRAPARE - Transportation    Lack of Transportation (Medical): No    Lack of Transportation (Non-Medical): No  Physical Activity: Insufficiently Active (04/08/2023)   Exercise Vital Sign    Days of Exercise per Week: 3 days    Minutes of Exercise per Session: 10 min  Stress: No Stress Concern Present (04/08/2023)   [employer] of Occupational Health - Occupational Stress Questionnaire    Feeling of Stress : Not at all  Social Connections: Socially Integrated (04/08/2023)   Social Connection and Isolation Panel [NHANES]    Frequency of Communication with Friends and Family: Three times a week    Frequency of Social Gatherings with Friends and Family: Twice a week    Attends Religious Services: 1 to 4 times per year    Active Member of [employer] or Organizations: Yes    Attends Engineer, structural: More than 4 times per year    Marital Status: Married  Catering manager Violence: Not At Risk (12/08/2023)   Received from [facility]   LH Intimate Violence    Are you afraid or have  you ever been threatened by a current partner, family member, or household member?: No    Within the past year, have you ever been hit, slapped, choked, forced  into sexual activity or otherwise hurt by a current partner or family member?: No    Family History:    Family History  Problem Relation Age of Onset   Diabetes Mother    Stroke Mother    Heart disease Father    Cancer Father    Stroke Father      ROS:  Please see the history of present illness.   All other ROS reviewed and negative.     Physical Exam/Data:   Vitals:   12/11/23 1800 12/11/23 1843 12/11/23 1938  BP:  (!) 152/75 (!) 150/67  Pulse:  (!) 56 (!) 56  Resp:  18 18  Temp:  97.6 F (36.4 C) 97.7 F (36.5 C)  TempSrc:  Oral Oral  SpO2:  99% 100%  Weight: 88.4 kg    Height: 5\' 3"  (1.6 m)     No intake or output data in the 24 hours ending 12/11/23 2026    12/11/2023    6:00 PM 04/08/2023    3:00 PM 11/16/2022    4:15 PM  Last 3 Weights  Weight (lbs) 194 lb 14.2 oz 194 lb 9.6 oz --  Weight (kg) 88.4 kg 88.27 kg --     Body mass index is 34.52 kg/m.  General:  Well nourished, well developed, in no acute distress HEENT: normal Neck: no JVD Vascular: No carotid bruits; Distal pulses 2+ bilaterally Cardiac:  normal S1, S2; RRR; no murmur  Lungs:  Diffuse rhonchi and mild wheezing, heard best LLL.  No rales  Abd: soft, nontender, no hepatomegaly  Ext: no edema Musculoskeletal:  No deformities, BUE and BLE strength normal and equal Skin: warm and dry  Neuro:  CNs 2-12 intact, no focal abnormalities noted Psych:  Normal affect   EKG:  The EKG was personally reviewed and demonstrates:  12/11/23 (17:41 hrs):  Unusual P-wave axis; cannot rule out septal infarct; inferior infarct Telemetry:  Telemetry was personally reviewed and demonstrates:  sinus bradycardia  Relevant CV Studies: # Echocardiogram performed 12/07/23 at outside facility demonstrated normal biventricular function.  Laboratory Data:  High  Sensitivity Troponin:   Recent Labs  Lab 12/11/23 1830  TROPONINIHS 14,726*     Chemistry Recent Labs  Lab 12/11/23 1830  NA 139  K 4.6  CL 95*  CO2 26  GLUCOSE 276*  BUN 56*  CREATININE 2.31*  CALCIUM  9.9  GFRNONAA 23*  ANIONGAP 18*    Recent Labs  Lab 12/11/23 1830  PROT 8.3*  ALBUMIN 3.5  AST 58*  ALT 35  ALKPHOS 79  BILITOT 0.5   Lipids No results for input(s): "CHOL", "TRIG", "HDL", "LABVLDL", "LDLCALC", "CHOLHDL" in the last 168 hours.  Hematology Recent Labs  Lab 12/11/23 1830  WBC 12.0*  RBC 4.97  HGB 13.7  HCT 42.3  MCV 85.1  MCH 27.6  MCHC 32.4  RDW 15.5  PLT 266   Thyroid No results for input(s): "TSH", "FREET4" in the last 168 hours.  BNPNo results for input(s): "BNP", "PROBNP" in the last 168 hours.  DDimer No results for input(s): "DDIMER" in the last 168 hours.   Radiology/Studies:  DG CHEST PORT 1 VIEW Result Date: 12/11/2023 CLINICAL DATA:  10026 Shortness of breath 10026 EXAM: PORTABLE CHEST 1 VIEW COMPARISON:  May 10, 2023 FINDINGS: The cardiomediastinal silhouette is unchanged and enlarged in contour.There is fullness of the RIGHT paratracheal stripe no pleural effusion. No pneumothorax. Band like opacities in the bilateral perihilar regions, most likely atelectasis. IMPRESSION: There is fullness of  the RIGHT paratracheal stripe. This could reflect adenopathy versus summation artifact. Recommend repeat PA and lateral chest radiograph for improved evaluation when clinically appropriate. Electronically Signed   By: Clancy Crimes M.D.   On: 12/11/2023 17:35     Assessment and Plan:   Probable NSTEMI-ACS: Patient atypical and pleuritic chest pain in the setting of pneumonia but noted rising HS-troponin at outside hospital concerning for NSTEMI-ACS.  She is now chest pain free and hemodynamically stable.  ECG consistent with likely anterior infarct, but similar to ECG performed on 05/25/19.  Patient was started on heparin drip at the  outside hospital and now on aspirin  and clopidogrel.  Currently receiving antibiotic treatment for bacteremia. --Reasonable to do an ischemic guided approach with completion of 48 hour heparin and continuation of aspirin  and clopidogrel.  --Pharmacologic stress test to further understand risk with potential coronary angiography can be done after bacteremia treatment has been completed.   --Continue rosuvastatin . --Repeat limited echocardiogram to reassess function post likely myocardial infarction.  Paroxysmal complete heart block: Reported based on outside hospital records. Patient has been evaluated for asymptomatic bradycardia with sinus pauses previously with our group, but typically in the setting of sleeping.  Patient asymptomatic. --Continue telemetry for now. --Further evaluation outpatient if patient remains stable.    Hypertension: Primary hypertension.  Blood pressure now uncontrolled. --Can restart home blood pressure medications.  Cerebrovascular accidents: --Secondary prevention with rosuvastatin  and aspirin .    Risk Assessment/Risk Scores:     TIMI Risk Score for Unstable Angina or Non-ST Elevation MI:   The patient's TIMI risk score is 3, which indicates a 13% risk of all cause mortality, new or recurrent myocardial infarction or need for urgent revascularization in the next 14 days.          For questions or updates, please contact [facility] Please consult [email or web address] for contact info under    Signed, Antonieta Battle, MD  12/11/2023 8:26 PM

## 2023-12-12 ENCOUNTER — Inpatient Hospital Stay (HOSPITAL_COMMUNITY)

## 2023-12-12 DIAGNOSIS — I214 Non-ST elevation (NSTEMI) myocardial infarction: Secondary | ICD-10-CM

## 2023-12-12 DIAGNOSIS — Z7189 Other specified counseling: Secondary | ICD-10-CM

## 2023-12-12 DIAGNOSIS — Z01810 Encounter for preprocedural cardiovascular examination: Secondary | ICD-10-CM

## 2023-12-12 DIAGNOSIS — J189 Pneumonia, unspecified organism: Secondary | ICD-10-CM

## 2023-12-12 DIAGNOSIS — E872 Acidosis, unspecified: Secondary | ICD-10-CM

## 2023-12-12 DIAGNOSIS — I2510 Atherosclerotic heart disease of native coronary artery without angina pectoris: Secondary | ICD-10-CM

## 2023-12-12 DIAGNOSIS — R7881 Bacteremia: Secondary | ICD-10-CM

## 2023-12-12 DIAGNOSIS — I495 Sick sinus syndrome: Secondary | ICD-10-CM

## 2023-12-12 HISTORY — DX: Pneumonia, unspecified organism: J18.9

## 2023-12-12 LAB — BASIC METABOLIC PANEL WITH GFR
Anion gap: 13 (ref 5–15)
BUN: 54 mg/dL — ABNORMAL HIGH (ref 8–23)
CO2: 30 mmol/L (ref 22–32)
Calcium: 9.8 mg/dL (ref 8.9–10.3)
Chloride: 96 mmol/L — ABNORMAL LOW (ref 98–111)
Creatinine, Ser: 1.98 mg/dL — ABNORMAL HIGH (ref 0.44–1.00)
GFR, Estimated: 28 mL/min — ABNORMAL LOW (ref >60)
Glucose, Bld: 140 mg/dL — ABNORMAL HIGH (ref 70–99)
Potassium: 3.7 mmol/L (ref 3.5–5.1)
Sodium: 139 mmol/L (ref 135–145)

## 2023-12-12 LAB — CBC
HCT: 39.8 % (ref 36.0–46.0)
Hemoglobin: 12.8 g/dL (ref 12.0–15.0)
MCH: 27.5 pg (ref 26.0–34.0)
MCHC: 32.2 g/dL (ref 30.0–36.0)
MCV: 85.6 fL (ref 80.0–100.0)
Platelets: 285 10*3/uL (ref 150–400)
RBC: 4.65 MIL/uL (ref 3.87–5.11)
RDW: 15.4 % (ref 11.5–15.5)
WBC: 13.2 10*3/uL — ABNORMAL HIGH (ref 4.0–10.5)
nRBC: 0 % (ref 0.0–0.2)

## 2023-12-12 LAB — ECHOCARDIOGRAM LIMITED
Area-P 1/2: 2.26 cm2
Height: 63 in
S' Lateral: 2.7 cm
Weight: 3118.19 [oz_av]

## 2023-12-12 LAB — HEPARIN LEVEL (UNFRACTIONATED): Heparin Unfractionated: <0.1 [IU]/mL — ABNORMAL LOW (ref 0.30–0.70)

## 2023-12-12 LAB — GLUCOSE, CAPILLARY
Glucose-Capillary: 150 mg/dL — ABNORMAL HIGH (ref 70–99)
Glucose-Capillary: 183 mg/dL — ABNORMAL HIGH (ref 70–99)
Glucose-Capillary: 214 mg/dL — ABNORMAL HIGH (ref 70–99)
Glucose-Capillary: 221 mg/dL — ABNORMAL HIGH (ref 70–99)

## 2023-12-12 MED ORDER — CEFAZOLIN SODIUM-DEXTROSE 2-4 GM/100ML-% IV SOLN
2.0000 g | Freq: Three times a day (TID) | INTRAVENOUS | Status: DC
  Administered 2023-12-12 – 2023-12-13 (×2): 2 g via INTRAVENOUS
  Filled 2023-12-12 (×4): qty 100

## 2023-12-12 NOTE — Progress Notes (Addendum)
 PROGRESS NOTE    Kelly Gibson  ZOX:[id number] DOB: [birth year] DOA: 12/11/2023 PCP: Annella Kief, NP  Chief Complaint  Patient presents with   Code Sepsis   Heart Block   Pneumonia    Brief Narrative:   Kelly Gibson is Kelly Gibson 63 y.o. female with medical history significant of stroke with chronic R sided weakness, diabetes, hypertension and other medical issues.  She was transferred from OSH in [place]  for NSTEMI, e. Coli bacteremia, and intermittent complete heart block.    She was admitted for AHRF initially requiring bipap.  She was treated for COPD exacerbation and pneumonia.  Initially admitted to the ICU.  She was found to have an NSTEMI and acute kidney injury.  Sounds like they were planning for Kelly Gibson transfer for heart catherization, but she was found to have E. Coli bacteremia.  She's been treated medically for her NSTEMI with heparin/plavix/aspirin .  She was transferred out of the ICU on 5/15.  She was noted to have intermittent episodes of complete heart block as well, pacemaker was deferred until her infection was treated.    She was transferred to [facility] on 5/17 for additional care.  See prior notes, care everywhere for additional details.  Assessment & Plan:   Principal Problem:   NSTEMI (non-ST elevated myocardial infarction) (HCC) Active Problems:   COPD (chronic obstructive pulmonary disease) (HCC)   Heart block AV complete (HCC)   Bacteremia due to Escherichia coli  No discharge summary in transfer paperwork Reviewed most recent progress notes (ID, cardiology, and hospitalist) that were sent down and over care everywhere.  See care everywhere for detailed reports of labs/imaging/other studies done at OSH.    NSTEMI Troponin peaked >60,000  5/16 troponin 39,631  Echo 5/14 with EF 55%, grade II diastolic dysfunction, anteroapical hypokinesis (see care everywhere report) Repeat troponins pending today Continue heparin gtt/aspirin /plavix for now (will clarify with  cards/pharmacy how much longer she'll need heparin) Crestor  nitroglycerin  prn chest pain Avoiding beta blocker due to bradycardia below and hx COPD per OSH cardiologist Follow repeat limited echo per cards Will consult cardiology here, revascularization on hold due to treatment of bacteremia   Sinus Bradycardia Intermittent Complete Heart Block Tele with junctional brady overnight Per OSH patient had intermittent episodes of complete heart block Plan was to reassess need for pacemaker after infection treated, hold any AV nodal blockers Cardiology consult, appreciate assistance   Sepsis due to E. Coli Bacteremia due to UTI with obstructing stone  Blood cultures from 5/14 notable for e. Coli, cefazolin  sensitive Urine culture with e. Coli  CT chest abd/pelvis at OSH with moderate L hydro - 1.6 cm stone alone L renal pelvis  CT renal protocol here with mild obstructive changes due to 15 mm stones Suspect she'll need intervention for source control, will defer to urology - will get clearance from cardiology before any intervention since she's stable from an infectious standpoint Appreciate urology assistance   AKI on CKD IV Unclear baseline creatinine, in 2024, creatinine was 2.35 Creatinine 3.2 5/16 Creatinine today is 1.98 moderate hydro on CT abd/pelvis from OSH CT renal stone protocol shows L renal calculi with mild obstructive changes due to 15 mm stone in renal pelvis Appreciate urology assistance with obstructing stone   Acute Hypoxic Respiratory Failure COPD Exacerbation Community Acquired Pneumonia CT on 5/14 with left basilar consolidation concerning for pneumonia CT PE protocol with "masslike consolidation within the LLL along with enlarged mediastinal and bilateral hilar LN's" - needs follow up  to resolution to ensure no mass lesion  She was treated for COPD exacerbation at presentation No si/sx exacerbation now Wean O2 as able    HFpEF with Exacerbation Was diuresed  at the OSH for concern for volume overload She appears euvolemic at this time Echo with preserved EF, grade 2 diastolic dysfunction, anteroapical hypokinesis (see care everywhere) Repeat echo per cards Strict I/O, daily weights   Hx Stroke with Chronic R Sided Weakness Noted   Hypertension Hold amlodipine , lasix , benicar for now   T2DM Takes 22 units long acting in morning, 10 units at night and SSI for short acting Reduced basal - adjust as needed Tradjenta SSI   Mood Disorder celexa    Apparently had tick on her side which was removed in the hospital, low suspicion that this is contributing to her current presentation -> NSTEMI and sepsis due e. Coli bacteremia in the setting of an obstructing stone.  They mentioned "being worked up for tick borne illness", but I don't see any labs (western blot) - they maybe pending?  Follow in care everywhere.  She received doxy at OSH, will d/c for now (she got 3 doses 100 mg, should be adequate for ppx dose which is 200 mg x1).     DVT prophylaxis: heparin gtt Code Status: full Family Communication: husband 5/17 Disposition:   Status is: Inpatient Remains inpatient appropriate because: need for ongoing inpatient care   Consultants:  Cardiology urology  Procedures:  none  Antimicrobials:  Anti-infectives (From admission, onward)    Start     Dose/Rate Route Frequency Ordered Stop   12/11/23 1915  ceFAZolin  (ANCEF ) IVPB 2g/100 mL premix        2 g 200 mL/hr over 30 Minutes Intravenous Every 12 hours 12/11/23 1826         Subjective: No complaints  Objective: Vitals:   12/12/23 0008 12/12/23 0449 12/12/23 0752 12/12/23 0804  BP: (!) 149/87 (!) 145/57    Pulse: (!) 52 (!) 47  (!) 48  Resp: 16 18 17    Temp: (!) 96 F (35.6 C) 97.6 F (36.4 C) 97.7 F (36.5 C)   TempSrc: Axillary Oral Oral   SpO2: 95% 99%  99%  Weight:      Height:        Intake/Output Summary (Last 24 hours) at 12/12/2023 0925 Last data filed at  12/12/2023 0754 Gross per 24 hour  Intake 408.67 ml  Output 375 ml  Net 33.67 ml   Filed Weights   12/11/23 1800  Weight: 88.4 kg    Examination:  General exam: Appears calm and comfortable  Respiratory system: unlabored Cardiovascular system: brady Gastrointestinal system: protuberant, nontender, soft Central nervous system: chronic R sided weakness Extremities: no LEE   Data Reviewed: I have personally reviewed following labs and imaging studies  CBC: Recent Labs  Lab 12/11/23 1830 12/12/23 0438  WBC 12.0* 13.2*  NEUTROABS 10.8*  --   HGB 13.7 12.8  HCT 42.3 39.8  MCV 85.1 85.6  PLT 266 285    Basic Metabolic Panel: Recent Labs  Lab 12/11/23 1830 12/12/23 0438  NA 139 139  K 4.6 3.7  CL 95* 96*  CO2 26 30  GLUCOSE 276* 140*  BUN 56* 54*  CREATININE 2.31* 1.98*  CALCIUM  9.9 9.8    GFR: Estimated Creatinine Clearance: 31.5 mL/min (Christien Berthelot) (by C-G formula based on SCr of 1.98 mg/dL (H)).  Liver Function Tests: Recent Labs  Lab 12/11/23 1830  AST 58*  ALT 35  ALKPHOS 79  BILITOT 0.5  PROT 8.3*  ALBUMIN 3.5    CBG: Recent Labs  Lab 12/11/23 2058 12/12/23 0751  GLUCAP 279* 150*     Recent Results (from the past 240 hours)  Culture, blood (Routine X 2) w Reflex to ID Panel     Status: None (Preliminary result)   Collection Time: 12/11/23  6:03 PM   Specimen: BLOOD  Result Value Ref Range Status   Specimen Description BLOOD BLOOD RIGHT HAND  Final   Special Requests   Final    BOTTLES DRAWN AEROBIC AND ANAEROBIC Blood Culture results may not be optimal due to an inadequate volume of blood received in culture bottles   Culture   Final    NO GROWTH < 12 HOURS Performed at [facility], [address] [place], [place] [address]    Report Status PENDING  Incomplete  Culture, blood (Routine X 2) w Reflex to ID Panel     Status: None (Preliminary result)   Collection Time: 12/11/23  6:16 PM   Specimen: BLOOD LEFT HAND  Result Value Ref  Range Status   Specimen Description BLOOD LEFT HAND  Final   Special Requests   Final    AEROBIC BOTTLE ONLY Blood Culture results may not be optimal due to an inadequate volume of blood received in culture bottles   Culture   Final    NO GROWTH < 12 HOURS Performed at [facility], [address], [place], [place] [address]    Report Status PENDING  Incomplete         Radiology Studies: CT RENAL STONE STUDY Result Date: 12/12/2023 CLINICAL DATA:  History of nephrolithiasis with flank pain, initial encounter EXAM: CT ABDOMEN AND PELVIS WITHOUT CONTRAST TECHNIQUE: Multidetector CT imaging of the abdomen and pelvis was performed following the standard protocol without IV contrast. RADIATION DOSE REDUCTION: This exam was performed according to the departmental dose-optimization program which includes automated exposure control, adjustment of the mA and/or kV according to patient size and/or use of iterative reconstruction technique. COMPARISON:  Ultrasound from 10/10/2021 FINDINGS: Lower chest: Chronic area of consolidation in the left lower lobe. Hepatobiliary: Cholelithiasis is noted. The liver is within normal limits. Pancreas: Unremarkable. No pancreatic ductal dilatation or surrounding inflammatory changes. Spleen: Normal in size without focal abnormality. Adrenals/Urinary Tract: Adrenal glands are within normal limits. Kidneys are well visualized bilaterally. Multiple renal calculi are noted on the left measuring up to 14 mm in the left renal pelvis. Mild fullness of the collecting system is noted secondary to the renal pelvic stone. The more distal left ureter is within normal limits. The bladder is within normal limits. Stomach/Bowel: Scattered fecal material is noted throughout the colon. No obstructive or inflammatory changes are noted. The appendix is within normal limits. No obstructive or inflammatory changes of the small bowel are seen. Stomach is within normal limits.  Vascular/Lymphatic: Aortic atherosclerosis. No enlarged abdominal or pelvic lymph nodes. Reproductive: Uterus and bilateral adnexa are unremarkable. Other: No abdominal wall hernia or abnormality. No abdominopelvic ascites. Musculoskeletal: No acute or significant osseous findings. IMPRESSION: Left renal calculi with mild obstructive changes secondary to the 15 mm stone in the renal pelvis. No distal obstructive changes are seen. Cholelithiasis without complicating factors. Chronic consolidation in the left lower lobe. Electronically Signed   By: Violeta Grey M.D.   On: 12/12/2023 01:54   DG CHEST PORT 1 VIEW Result Date: 12/11/2023 CLINICAL DATA:  10026 Shortness of breath 10026 EXAM:  PORTABLE CHEST 1 VIEW COMPARISON:  May 10, 2023 FINDINGS: The cardiomediastinal silhouette is unchanged and enlarged in contour.There is fullness of the RIGHT paratracheal stripe no pleural effusion. No pneumothorax. Band like opacities in the bilateral perihilar regions, most likely atelectasis. IMPRESSION: There is fullness of the RIGHT paratracheal stripe. This could reflect adenopathy versus summation artifact. Recommend repeat PA and lateral chest radiograph for improved evaluation when clinically appropriate. Electronically Signed   By: Clancy Crimes M.D.   On: 12/11/2023 17:35        Scheduled Meds:  aspirin  EC  81 mg Oral Daily   budesonide-glycopyrrolate-formoterol  2 puff Inhalation BID   citalopram   10 mg Oral Daily   clopidogrel  75 mg Oral Daily   insulin  aspart  0-5 Units Subcutaneous QHS   insulin  aspart  0-9 Units Subcutaneous TID WC   insulin  glargine-yfgn  7 Units Subcutaneous BID   linagliptin  5 mg Oral Daily   polyethylene glycol  17 g Oral BID   rosuvastatin   40 mg Oral QHS   Continuous Infusions:   ceFAZolin  (ANCEF ) IV Stopped (12/11/23 2226)   heparin 850 Units/hr (12/12/23 0846)     LOS: 1 day    Time spent: over 30 min    Donnetta Gains, MD [facility]   To contact the attending provider between 7A-7P or the covering provider during after hours 7P-7A, please log into the web site [email or web address] and access using universal [place] password for that web site. If you do not have the password, please call the hospital operator.  12/12/2023, 9:25 AM

## 2023-12-12 NOTE — Consult Note (Signed)
 Urology Consult   History of Present Illness: Kelly Gibson is a 63 y.o. F transferred here from a facility in [place] .  She was admitted there with NSTEMI and sepsis likely of GU origin.  Urine and blood cultures were positive for E. coli.  An outside CT scan showed left sided stones with hydro-these images are not available.  [facility] at [facility] notable for troponins greater than 15,000.  Creatinine 1.98 today (was 1.4 1-year ago).  Cultures have been repeated, she is currently on Ancef .  Patient is afebrile with stable blood pressures.  She is bradycardic.  CT scan was repeated upon transfer and I reviewed the images.. Patient has 2 sizable stones in the left kidney, each around 1-1.5 cm effectively forming a partial staghorn. There is mild hydronephrosis  Past Medical History:  Diagnosis Date   Bradycardia    SR/SB with up to 3.5 second pause (asymptomatic) on ~ 10/2017 event monitor; saw EP Dr. Nunzio Belch: avoid AV nodal blocking agents   COPD (chronic obstructive pulmonary disease) (HCC)    Depression    Depression with anxiety 08/29/2021   Diabetes mellitus    Dysarthria, post-stroke    Ganglion cyst 03/09/2012   Hyperlipidemia    Hypertension    Hypokalemia    Obesity, unspecified 12/17/2011   PONV (postoperative nausea and vomiting)    Skin lesion of face 11/17/2022   Stroke [facility])     Past Surgical History:  Procedure Laterality Date   CESAREAN SECTION     x 3   ORIF WRIST FRACTURE Right 05/04/2018   Procedure: OPEN REDUCTION INTERNAL FIXATION (ORIF) RIGHT  WRIST FRACTURE;  Surgeon: Micheline Ahr, MD;  Location: [facility] OR;  Service: Orthopedics;  Laterality: Right;   TUMOR REMOVAL     left shoulder    Current Hospital Medications:  Home Meds:  No current facility-administered medications on file prior to encounter.   Current Outpatient Medications on File Prior to Encounter  Medication Sig Dispense Refill   albuterol  (VENTOLIN  HFA) 108 (90 Base) MCG/ACT inhaler Inhale 2  puffs into the lungs every 6 (six) hours as needed for wheezing or shortness of breath. 18 g 2   amLODipine  (NORVASC ) 5 MG tablet Take 1 tablet (5 mg total) by mouth daily. (Patient taking differently: Take 5 mg by mouth at bedtime.) 90 tablet 3   aspirin  EC 325 MG EC tablet Take 1 tablet (325 mg total) by mouth daily. (Patient taking differently: Take 325 mg by mouth in the morning.) 30 tablet 0   citalopram  (CELEXA ) 10 MG tablet TAKE 1 TABLET BY MOUTH DAILY (Patient taking differently: Take 10 mg by mouth in the morning.) 90 tablet 1   clotrimazole -betamethasone  (LOTRISONE ) cream Apply 1 application topically 2 (two) times daily. 30 g 0   Doxylamine Succinate, Sleep, (UNISOM PO) Take 2 each by mouth at bedtime. Gel capsules     furosemide  (LASIX ) 20 MG tablet Take 1 tablet (20 mg total) by mouth daily as needed. 90 tablet 1   HUMALOG  KWIKPEN 100 UNIT/ML KwikPen INJECT 3 TO 5 UNITS INTO THE SKIN DAILY BEFORE LUNCH (Patient taking differently: Inject 2-10 Units into the skin with breakfast, with lunch, and with evening meal. Only if above 145) 6 mL 3   insulin  degludec (TRESIBA  FLEXTOUCH) 100 UNIT/ML FlexTouch Pen Inject 10 Units into the skin 2 (two) times daily. Adjust by 2 units every 3 days as needed for blood sugars higher than 150 in the morning fasting. (Patient taking differently: Inject 10-22  Units into the skin 2 (two) times daily. Adjust by 2 units every 3 days as needed for blood sugars higher than 150 in the morning fasting. 22 qam, 10qhs) 9 mL 11   Melatonin 5 MG CHEW Chew 10 mg by mouth at bedtime.     Multiple Vitamins-Minerals (MULTIVITAMIN GUMMIES WOMENS) CHEW Chew 2 each by mouth in the morning.     mupirocin  ointment (BACTROBAN ) 2 % Apply topically daily. For lesion on forehead. (Patient taking differently: Apply 1 Application topically as needed. For lesion on forehead.) 15 g 1   olmesartan (BENICAR) 40 MG tablet Take 40 mg by mouth in the morning.     rosuvastatin  (CRESTOR ) 40 MG  tablet TAKE 1 TABLET BY MOUTH DAILY (Patient taking differently: Take 40 mg by mouth at bedtime.) 90 tablet 3   tiZANidine  (ZANAFLEX ) 2 MG tablet TAKE 1-2 TABLETS UP TO TWICE A DAY  AS NEEDED FOR MUSCLE SPASMS 180 tablet 1   UNABLE TO FIND Take 1 tablet by mouth with breakfast, with lunch, and with evening meal. GlucoGold     Continuous Glucose Sensor (DEXCOM G7 SENSOR) MISC USE ONE SENSORE EVERY 10 DAYS     Glucose Blood (BLOOD GLUCOSE TEST STRIPS) STRP Use as directed to monitor blood sugar up to 4x daily. Dx: E11.9. 200 each 11   Insulin  Pen Needle (DROPLET PEN NEEDLES) 32G X 4 MM MISC 5/day for 90 days     Lancets 28G MISC 1 Units by Does not apply route 4 (four) times daily as needed. 200 each 11   TRADJENTA 5 MG TABS tablet Take 5 mg by mouth daily. (Patient not taking: Reported on 12/11/2023)     umeclidinium bromide  (INCRUSE ELLIPTA ) 62.5 MCG/ACT AEPB Inhale 1 puff into the lungs daily. (Patient not taking: Reported on 12/11/2023)       Scheduled Meds:  aspirin  EC  81 mg Oral Daily   budesonide-glycopyrrolate-formoterol  2 puff Inhalation BID   citalopram   10 mg Oral Daily   clopidogrel  75 mg Oral Daily   insulin  aspart  0-5 Units Subcutaneous QHS   insulin  aspart  0-9 Units Subcutaneous TID WC   insulin  glargine-yfgn  7 Units Subcutaneous BID   linagliptin  5 mg Oral Daily   polyethylene glycol  17 g Oral BID   rosuvastatin   40 mg Oral QHS   Continuous Infusions:   ceFAZolin  (ANCEF ) IV Stopped (12/11/23 2226)   heparin 700 Units/hr (12/12/23 0316)   PRN Meds:.acetaminophen  **OR** acetaminophen , albuterol , melatonin, nitroGLYCERIN , ondansetron  **OR** ondansetron  (ZOFRAN ) IV, polyethylene glycol  Allergies:  Allergies  Allergen Reactions   Latex Itching and Rash   Ciprofloxacin Other (See Comments)   Niacin Nausea And Vomiting   Niacin And Related Rash    Family History  Problem Relation Age of Onset   Diabetes Mother    Stroke Mother    Heart disease Father     Cancer Father    Stroke Father     Social History:  reports that she has quit smoking. Her smoking use included cigarettes. She has a 20 pack-year smoking history. She has never used smokeless tobacco. She reports that she does not drink alcohol and does not use drugs.  ROS: A complete review of systems was performed.  All systems are negative except for pertinent findings as noted.  Physical Exam:  Vital signs in last 24 hours: Temp:  [96 F (35.6 C)-97.7 F (36.5 C)] 97.6 F (36.4 C) (05/18 0449) Pulse Rate:  [47-56] 47 (  05/18 0449) Resp:  [16-18] 18 (05/18 0449) BP: (145-152)/(57-87) 145/57 (05/18 0449) SpO2:  [95 %-100 %] 99 % (05/18 0449) Weight:  [88.4 kg] 88.4 kg (05/17 1800) Constitutional:  Alert and oriented, No acute distress Cardiovascular: Regular rate and rhythm Respiratory: Normal respiratory effort, Lungs clear bilaterally GI: Abdomen is soft, nontender, nondistended, no abdominal masses Neurologic: Grossly intact, no focal deficits Psychiatric: Normal mood and affect  Laboratory Data:  Recent Labs    12/11/23 1830 12/12/23 0438  WBC 12.0* 13.2*  HGB 13.7 12.8  HCT 42.3 39.8  PLT 266 285    Recent Labs    12/11/23 1830 12/12/23 0438  NA 139 139  K 4.6 3.7  CL 95* 96*  GLUCOSE 276* 140*  BUN 56* 54*  CALCIUM  9.9 9.8  CREATININE 2.31* 1.98*     Results for orders placed or performed during the hospital encounter of 12/11/23 (from the past 24 hours)  Culture, blood (Routine X 2) w Reflex to ID Panel     Status: None (Preliminary result)   Collection Time: 12/11/23  6:03 PM   Specimen: BLOOD  Result Value Ref Range   Specimen Description BLOOD BLOOD RIGHT HAND    Special Requests      BOTTLES DRAWN AEROBIC AND ANAEROBIC Blood Culture results may not be optimal due to an inadequate volume of blood received in culture bottles   Culture      NO GROWTH < 12 HOURS Performed at [facility], [address], [place], [place] [address]     Report Status PENDING   Lactic acid, plasma     Status: Abnormal   Collection Time: 12/11/23  6:16 PM  Result Value Ref Range   Lactic Acid, Venous 3.0 (HH) 0.5 - 1.9 mmol/L  Culture, blood (Routine X 2) w Reflex to ID Panel     Status: None (Preliminary result)   Collection Time: 12/11/23  6:16 PM   Specimen: BLOOD  Result Value Ref Range   Specimen Description BLOOD BLOOD LEFT HAND    Special Requests      AEROBIC BOTTLE ONLY Blood Culture results may not be optimal due to an inadequate volume of blood received in culture bottles   Culture      NO GROWTH < 12 HOURS Performed at [facility], [address] [place], [place] [address]    Report Status PENDING   CBC with Differential/Platelet     Status: Abnormal   Collection Time: 12/11/23  6:30 PM  Result Value Ref Range   WBC 12.0 (H) 4.0 - 10.5 K/uL   RBC 4.97 3.87 - 5.11 MIL/uL   Hemoglobin 13.7 12.0 - 15.0 g/dL   HCT 09.8 11.9 - 14.7 %   MCV 85.1 80.0 - 100.0 fL   MCH 27.6 26.0 - 34.0 pg   MCHC 32.4 30.0 - 36.0 g/dL   RDW 82.9 56.2 - 13.0 %   Platelets 266 150 - 400 K/uL   nRBC 0.0 0.0 - 0.2 %   Neutrophils Relative % 91 %   Neutro Abs 10.8 (H) 1.7 - 7.7 K/uL   Lymphocytes Relative 5 %   Lymphs Abs 0.6 (L) 0.7 - 4.0 K/uL   Monocytes Relative 3 %   Monocytes Absolute 0.4 0.1 - 1.0 K/uL   Eosinophils Relative 0 %   Eosinophils Absolute 0.0 0.0 - 0.5 K/uL   Basophils Relative 0 %   Basophils Absolute 0.0 0.0 - 0.1 K/uL   Immature Granulocytes 1 %  Abs Immature Granulocytes 0.16 (H) 0.00 - 0.07 K/uL  Comprehensive metabolic panel     Status: Abnormal   Collection Time: 12/11/23  6:30 PM  Result Value Ref Range   Sodium 139 135 - 145 mmol/L   Potassium 4.6 3.5 - 5.1 mmol/L   Chloride 95 (L) 98 - 111 mmol/L   CO2 26 22 - 32 mmol/L   Glucose, Bld 276 (H) 70 - 99 mg/dL   BUN 56 (H) 8 - 23 mg/dL   Creatinine, Ser 8.29 (H) 0.44 - 1.00 mg/dL   Calcium  9.9 8.9 - 10.3 mg/dL   Total Protein 8.3 (H) 6.5 - 8.1 g/dL    Albumin 3.5 3.5 - 5.0 g/dL   AST 58 (H) 15 - 41 U/L   ALT 35 0 - 44 U/L   Alkaline Phosphatase 79 38 - 126 U/L   Total Bilirubin 0.5 0.0 - 1.2 mg/dL   GFR, Estimated 23 (L) >60 mL/min   Anion gap 18 (H) 5 - 15  Troponin I (High Sensitivity)     Status: Abnormal   Collection Time: 12/11/23  6:30 PM  Result Value Ref Range   Troponin I (High Sensitivity) 14,726 (HH) <18 ng/L  HIV Antibody (routine testing w rflx)     Status: None   Collection Time: 12/11/23  6:30 PM  Result Value Ref Range   HIV Screen 4th Generation wRfx Non Reactive Non Reactive  Heparin level (unfractionated)     Status: None   Collection Time: 12/11/23  6:30 PM  Result Value Ref Range   Heparin Unfractionated 0.31 0.30 - 0.70 IU/mL  Hemoglobin A1c     Status: Abnormal   Collection Time: 12/11/23  6:30 PM  Result Value Ref Range   Hgb A1c MFr Bld 8.0 (H) 4.8 - 5.6 %   Mean Plasma Glucose 182.9 mg/dL  Glucose, capillary     Status: Abnormal   Collection Time: 12/11/23  8:58 PM  Result Value Ref Range   Glucose-Capillary 279 (H) 70 - 99 mg/dL  Lactic acid, plasma     Status: Abnormal   Collection Time: 12/11/23  9:00 PM  Result Value Ref Range   Lactic Acid, Venous 2.3 (HH) 0.5 - 1.9 mmol/L  Troponin I (High Sensitivity)     Status: Abnormal   Collection Time: 12/11/23  9:00 PM  Result Value Ref Range   Troponin I (High Sensitivity) 15,142 (HH) <18 ng/L  Heparin level (unfractionated)     Status: Abnormal   Collection Time: 12/12/23  4:38 AM  Result Value Ref Range   Heparin Unfractionated <0.10 (L) 0.30 - 0.70 IU/mL  CBC     Status: Abnormal   Collection Time: 12/12/23  4:38 AM  Result Value Ref Range   WBC 13.2 (H) 4.0 - 10.5 K/uL   RBC 4.65 3.87 - 5.11 MIL/uL   Hemoglobin 12.8 12.0 - 15.0 g/dL   HCT 56.2 13.0 - 86.5 %   MCV 85.6 80.0 - 100.0 fL   MCH 27.5 26.0 - 34.0 pg   MCHC 32.2 30.0 - 36.0 g/dL   RDW 78.4 69.6 - 29.5 %   Platelets 285 150 - 400 K/uL   nRBC 0.0 0.0 - 0.2 %  Basic metabolic  panel     Status: Abnormal   Collection Time: 12/12/23  4:38 AM  Result Value Ref Range   Sodium 139 135 - 145 mmol/L   Potassium 3.7 3.5 - 5.1 mmol/L   Chloride 96 (L) 98 - 111 mmol/L  CO2 30 22 - 32 mmol/L   Glucose, Bld 140 (H) 70 - 99 mg/dL   BUN 54 (H) 8 - 23 mg/dL   Creatinine, Ser 1.61 (H) 0.44 - 1.00 mg/dL   Calcium  9.8 8.9 - 10.3 mg/dL   GFR, Estimated 28 (L) >60 mL/min   Anion gap 13 5 - 15   Recent Results (from the past 240 hours)  Culture, blood (Routine X 2) w Reflex to ID Panel     Status: None (Preliminary result)   Collection Time: 12/11/23  6:03 PM   Specimen: BLOOD  Result Value Ref Range Status   Specimen Description BLOOD BLOOD RIGHT HAND  Final   Special Requests   Final    BOTTLES DRAWN AEROBIC AND ANAEROBIC Blood Culture results may not be optimal due to an inadequate volume of blood received in culture bottles   Culture   Final    NO GROWTH < 12 HOURS Performed at [facility], [address], [place], [place] [address]    Report Status PENDING  Incomplete  Culture, blood (Routine X 2) w Reflex to ID Panel     Status: None (Preliminary result)   Collection Time: 12/11/23  6:16 PM   Specimen: BLOOD  Result Value Ref Range Status   Specimen Description BLOOD BLOOD LEFT HAND  Final   Special Requests   Final    AEROBIC BOTTLE ONLY Blood Culture results may not be optimal due to an inadequate volume of blood received in culture bottles   Culture   Final    NO GROWTH < 12 HOURS Performed at [facility], [address], [place], [place] [address]    Report Status PENDING  Incomplete    Renal Function: Recent Labs    12/11/23 1830 12/12/23 0438  CREATININE 2.31* 1.98*   Estimated Creatinine Clearance: 31.5 mL/min (A) (by C-G formula based on SCr of 1.98 mg/dL (H)).  Radiologic Imaging: CT RENAL STONE STUDY Result Date: 12/12/2023 CLINICAL DATA:  History of nephrolithiasis with flank pain, initial encounter EXAM: CT ABDOMEN AND PELVIS  WITHOUT CONTRAST TECHNIQUE: Multidetector CT imaging of the abdomen and pelvis was performed following the standard protocol without IV contrast. RADIATION DOSE REDUCTION: This exam was performed according to the departmental dose-optimization program which includes automated exposure control, adjustment of the mA and/or kV according to patient size and/or use of iterative reconstruction technique. COMPARISON:  Ultrasound from 10/10/2021 FINDINGS: Lower chest: Chronic area of consolidation in the left lower lobe. Hepatobiliary: Cholelithiasis is noted. The liver is within normal limits. Pancreas: Unremarkable. No pancreatic ductal dilatation or surrounding inflammatory changes. Spleen: Normal in size without focal abnormality. Adrenals/Urinary Tract: Adrenal glands are within normal limits. Kidneys are well visualized bilaterally. Multiple renal calculi are noted on the left measuring up to 14 mm in the left renal pelvis. Mild fullness of the collecting system is noted secondary to the renal pelvic stone. The more distal left ureter is within normal limits. The bladder is within normal limits. Stomach/Bowel: Scattered fecal material is noted throughout the colon. No obstructive or inflammatory changes are noted. The appendix is within normal limits. No obstructive or inflammatory changes of the small bowel are seen. Stomach is within normal limits. Vascular/Lymphatic: Aortic atherosclerosis. No enlarged abdominal or pelvic lymph nodes. Reproductive: Uterus and bilateral adnexa are unremarkable. Other: No abdominal wall hernia or abnormality. No abdominopelvic ascites. Musculoskeletal: No acute or significant osseous findings. IMPRESSION: Left renal calculi with mild obstructive changes secondary to the  15 mm stone in the renal pelvis. No distal obstructive changes are seen. Cholelithiasis without complicating factors. Chronic consolidation in the left lower lobe. Electronically Signed   By: Violeta Grey M.D.   On:  12/12/2023 01:54   DG CHEST PORT 1 VIEW Result Date: 12/11/2023 CLINICAL DATA:  10026 Shortness of breath 10026 EXAM: PORTABLE CHEST 1 VIEW COMPARISON:  May 10, 2023 FINDINGS: The cardiomediastinal silhouette is unchanged and enlarged in contour.There is fullness of the RIGHT paratracheal stripe no pleural effusion. No pneumothorax. Band like opacities in the bilateral perihilar regions, most likely atelectasis. IMPRESSION: There is fullness of the RIGHT paratracheal stripe. This could reflect adenopathy versus summation artifact. Recommend repeat PA and lateral chest radiograph for improved evaluation when clinically appropriate. Electronically Signed   By: Clancy Crimes M.D.   On: 12/11/2023 17:35    I independently reviewed the above imaging studies.  Impression/Recommendation Ms. Wareing is a 63 year old female with a complicated presentation.  As mentioned above her CT scan is consistent with a left partial staghorn with mild hydronephrosis.  There is no evidence of acute renal obstruction.  Given this, I would recommend left nephrostomy tube placement.  I do not think this needs to be done emergently but would recommend during this hospitalization once optimized; would be necessary to hold Plavix and heparin.     We discussed in the intermediate term she would likely need PCNL to address the substantial stone burden once over her acute issues and medically optimized.  Urology following  Julene Oaks MD 12/12/2023, 6:36 AM  [facility]  Pager: [phone]

## 2023-12-12 NOTE — Progress Notes (Signed)
 ANTICOAGULATION CONSULT NOTE  Pharmacy Consult for heparin Indication: chest pain/ACS  Allergies  Allergen Reactions   Latex Itching and Rash   Ciprofloxacin Other (See Comments)   Niacin Nausea And Vomiting   Niacin And Related Rash    Patient Measurements: Height: 5\' 3"  (160 cm) Weight: 88.4 kg (194 lb 14.2 oz) IBW/kg (Calculated) : 52.4 Heparin Dosing Weight: 72 kg  Vital Signs: Temp: 97.6 F (36.4 C) (05/18 0449) Temp Source: Oral (05/18 0449) BP: 145/57 (05/18 0449) Pulse Rate: 47 (05/18 0449)  Labs: Recent Labs    12/11/23 1830 12/11/23 2100 12/12/23 0438  HGB 13.7  --  12.8  HCT 42.3  --  39.8  PLT 266  --  285  HEPARINUNFRC 0.31  --  <0.10*  CREATININE 2.31*  --  1.98*  TROPONINIHS 14,726* 15,142*  --     Estimated Creatinine Clearance: 31.5 mL/min (A) (by C-G formula based on SCr of 1.98 mg/dL (H)).  Medical History: Past Medical History:  Diagnosis Date   Bradycardia    SR/SB with up to 3.5 second pause (asymptomatic) on ~ 10/2017 event monitor; saw EP Dr. Nunzio Belch: avoid AV nodal blocking agents   COPD (chronic obstructive pulmonary disease) (HCC)    Depression    Depression with anxiety 08/29/2021   Diabetes mellitus    Dysarthria, post-stroke    Ganglion cyst 03/09/2012   Hyperlipidemia    Hypertension    Hypokalemia    Obesity, unspecified 12/17/2011   PONV (postoperative nausea and vomiting)    Skin lesion of face 11/17/2022   Stroke [facility])     Medications:  See MAR  Assessment: 63 yo female transferred from [facility] with NSTEMI and CHB for cath and possible PPM, heparin running at 650 units/hr (last aPTT collected 5/16 AM was 90 sec, therapeutic).  Not on anticoagulation prior to admission.  Pharmacy consulted for heparin dosing.  Heparin level is undetectable this morning (<0.1), on 700 units/hr. Hgb 12.8, plt 285. No s/sx of bleeding or infusion issues. No pauses per RN to explain drop in HL.  Goal of Therapy:  Heparin level 0.3-0.7  units/ml Monitor platelets by anticoagulation protocol: Yes   Plan:  Increase heparin IV to 850 units/hr Order heparin level in 6 hours  Daily CBC, anti-Xa level Monitor for s/sx of bleeding  Thank you for allowing pharmacy to participate in this patient's care,  Nieves Bars, PharmD, BCCCP Clinical Pharmacist  Phone: [phone] 12/12/2023 7:31 AM  Please check AMION for all [facility] Pharmacy phone numbers After 10:00 PM, call Main Pharmacy [phone]

## 2023-12-12 NOTE — Plan of Care (Signed)

## 2023-12-12 NOTE — Progress Notes (Addendum)
 Progress Note  Patient Name: JAQUASHA CARNEVALE Date of Encounter: 12/12/2023  Primary Cardiologist: Hazle Lites, MD  Subjective   Chest pain free, never had any typical angina.  Doing great.  Daughter at bedside.  Has a lot of questions.  Inpatient Medications    Scheduled Meds:  aspirin  EC  81 mg Oral Daily   budesonide-glycopyrrolate-formoterol  2 puff Inhalation BID   citalopram   10 mg Oral Daily   insulin  aspart  0-5 Units Subcutaneous QHS   insulin  aspart  0-9 Units Subcutaneous TID WC   insulin  glargine-yfgn  7 Units Subcutaneous BID   linagliptin  5 mg Oral Daily   polyethylene glycol  17 g Oral BID   rosuvastatin   40 mg Oral QHS   Continuous Infusions:   ceFAZolin  (ANCEF ) IV Stopped (12/11/23 2226)   heparin 850 Units/hr (12/12/23 0846)   PRN Meds: acetaminophen  **OR** acetaminophen , albuterol , melatonin, nitroGLYCERIN , ondansetron  **OR** ondansetron  (ZOFRAN ) IV, polyethylene glycol   Vital Signs    Vitals:   12/12/23 0008 12/12/23 0449 12/12/23 0752 12/12/23 0804  BP: (!) 149/87 (!) 145/57    Pulse: (!) 52 (!) 47  (!) 48  Resp: 16 18 17    Temp: (!) 96 F (35.6 C) 97.6 F (36.4 C) 97.7 F (36.5 C)   TempSrc: Axillary Oral Oral   SpO2: 95% 99%  99%  Weight:      Height:        Intake/Output Summary (Last 24 hours) at 12/12/2023 1043 Last data filed at 12/12/2023 0754 Gross per 24 hour  Intake 408.67 ml  Output 375 ml  Net 33.67 ml   Filed Weights   12/11/23 1800  Weight: 88.4 kg    Telemetry     Personally reviewed.  Severe sinus bradycardia, ectopic atrial rhythm, junctional rhythm.  ECG    Not performed today.  Physical Exam   GEN: No acute distress.   Neck: No JVD. Cardiac: RRR, no murmur, rub, or gallop.  Respiratory: Nonlabored. Clear to auscultation bilaterally. GI: Soft, nontender, bowel sounds present. MS: No edema; No deformity. Neuro:  Nonfocal. Psych: Alert and oriented x 3. Normal affect.  Labs     Chemistry Recent Labs  Lab 12/11/23 1830 12/12/23 0438  NA 139 139  K 4.6 3.7  CL 95* 96*  CO2 26 30  GLUCOSE 276* 140*  BUN 56* 54*  CREATININE 2.31* 1.98*  CALCIUM  9.9 9.8  PROT 8.3*  --   ALBUMIN 3.5  --   AST 58*  --   ALT 35  --   ALKPHOS 79  --   BILITOT 0.5  --   GFRNONAA 23* 28*  ANIONGAP 18* 13     Hematology Recent Labs  Lab 12/11/23 1830 12/12/23 0438  WBC 12.0* 13.2*  RBC 4.97 4.65  HGB 13.7 12.8  HCT 42.3 39.8  MCV 85.1 85.6  MCH 27.6 27.5  MCHC 32.4 32.2  RDW 15.5 15.4  PLT 266 285    Cardiac Enzymes Recent Labs  Lab 12/11/23 1830 12/11/23 2100  TROPONINIHS 14,726* 15,142*    BNPNo results for input(s): "BNP", "PROBNP" in the last 168 hours.   DDimerNo results for input(s): "DDIMER" in the last 168 hours.   Radiology    DG Chest 2 View Result Date: 12/12/2023 CLINICAL DATA:  63 year old female code sepsis.  Cough.  NSTEMI. EXAM: CHEST - 2 VIEW COMPARISON:  Portable chest yesterday and earlier. CT Abdomen and Pelvis this morning. FINDINGS: Confluent left lower lobe, retrocardiac  opacity seen on CT this morning. Lung volumes and mediastinal contours stable from yesterday. Linear thickening or scarring along the right minor fissure is stable. No pneumothorax or pulmonary edema. Stable lung volumes and mediastinal contours. Decreased bowel gas in the upper abdomen. Stable visualized osseous structures. IMPRESSION: 1. Left lower lobe Pneumonia vs.mass, visible on CT Abdomen and Pelvis this morning. No pleural effusion. Follow-up will be necessary to exclude Tumor. 2. Linear scarring or pleural thickening along the right minor fissure. 3. No other cardiopulmonary abnormality. Electronically Signed   By: Marlise Simpers M.D.   On: 12/12/2023 09:52   CT RENAL STONE STUDY Result Date: 12/12/2023 CLINICAL DATA:  History of nephrolithiasis with flank pain, initial encounter EXAM: CT ABDOMEN AND PELVIS WITHOUT CONTRAST TECHNIQUE: Multidetector CT imaging of the  abdomen and pelvis was performed following the standard protocol without IV contrast. RADIATION DOSE REDUCTION: This exam was performed according to the departmental dose-optimization program which includes automated exposure control, adjustment of the mA and/or kV according to patient size and/or use of iterative reconstruction technique. COMPARISON:  Ultrasound from 10/10/2021 FINDINGS: Lower chest: Chronic area of consolidation in the left lower lobe. Hepatobiliary: Cholelithiasis is noted. The liver is within normal limits. Pancreas: Unremarkable. No pancreatic ductal dilatation or surrounding inflammatory changes. Spleen: Normal in size without focal abnormality. Adrenals/Urinary Tract: Adrenal glands are within normal limits. Kidneys are well visualized bilaterally. Multiple renal calculi are noted on the left measuring up to 14 mm in the left renal pelvis. Mild fullness of the collecting system is noted secondary to the renal pelvic stone. The more distal left ureter is within normal limits. The bladder is within normal limits. Stomach/Bowel: Scattered fecal material is noted throughout the colon. No obstructive or inflammatory changes are noted. The appendix is within normal limits. No obstructive or inflammatory changes of the small bowel are seen. Stomach is within normal limits. Vascular/Lymphatic: Aortic atherosclerosis. No enlarged abdominal or pelvic lymph nodes. Reproductive: Uterus and bilateral adnexa are unremarkable. Other: No abdominal wall hernia or abnormality. No abdominopelvic ascites. Musculoskeletal: No acute or significant osseous findings. IMPRESSION: Left renal calculi with mild obstructive changes secondary to the 15 mm stone in the renal pelvis. No distal obstructive changes are seen. Cholelithiasis without complicating factors. Chronic consolidation in the left lower lobe. Electronically Signed   By: Violeta Grey M.D.   On: 12/12/2023 01:54   DG CHEST PORT 1 VIEW Result Date:  12/11/2023 CLINICAL DATA:  10026 Shortness of breath 10026 EXAM: PORTABLE CHEST 1 VIEW COMPARISON:  May 10, 2023 FINDINGS: The cardiomediastinal silhouette is unchanged and enlarged in contour.There is fullness of the RIGHT paratracheal stripe no pleural effusion. No pneumothorax. Band like opacities in the bilateral perihilar regions, most likely atelectasis. IMPRESSION: There is fullness of the RIGHT paratracheal stripe. This could reflect adenopathy versus summation artifact. Recommend repeat PA and lateral chest radiograph for improved evaluation when clinically appropriate. Electronically Signed   By: Clancy Crimes M.D.   On: 12/11/2023 17:35    Assessment & Plan   NSTEMI: Likely type II but cannot rule out multivessel CAD due to significant elevation of high-sensitivity troponins. Presented with acute onset of SOB (similar to her COPD exacerbation) at outside hospital in [place] .  Did not have any chest pain. Hs troponins peaked at >60k. EKG showed Q waves in inferior leads (old) and no new ischemia. Current EKG looks similar to EKG from 2020. No new changes. Hs troponins trending down, 15k now. Chest pain-free since admission. I reviewed  echocardiogram report performed at OSH that showed normal LVEF, G2 DD, anteroapical hypokinesis and no significant valvular heart disease. Limited echocardiogram is pending today. Given her CKD stage IIIb and no chest pains, okay to stop heparin drip this evening (already received it for the last 4 to 5 days). Continue aspirin  81 mg once daily and high intensity statin. Hold Plavix for nephrostomy tube placement.  She will benefit from diagnostic LHC prior to discharge from the hospital but does not have to be performed prior to nephrostomy tube placement.  Left partial staghorn calculus with mild hydronephrosis: Urology on board, recommended left nephrostomy tube placement. Not emergent but will need to be performed during this hospitalization. Serum  creatinine 1.9 today, improved compared to yesterday.   Preop cardiac risk stratification for left nephrostomy tube placement: Challenging/complicated situation.  Always had noncardiac chest pains, lasting for about few seconds once a week and did not have chest pain prior to current hospitalization.  Chest pain-free. Not a candidate for Lexiscan or cardiac PET due to significant sinus bradycardia and ectopic atrial rhythm. She does have multivessel coronary artery calcifications based on CT chest from 2020 and given her significant troponin elevation this admission, she probably has multivessel CAD. She will not be candidate for CABG as she is wheelchair-bound. She is at a moderate to high risk for any perioperative cardiac complications for a fairly low risk procedure, nephrostomy tube placement.   Severe sinus bradycardia, ectopic atrial  bradycardia and junctional bradycardia: Asymptomatic.  Chronic ongoing issue.  Avoid AV nodal agents.  Outpatient OSA evaluation.  Will monitor for now.  E. coli bacteremia, pneumonia: On antibiotics, management per primary team.  Severe lactic acidosis, improving: Likely secondary to sepsis.  Antibiotics per primary team.   Complex decision making Daughter has a lot of questions.  Answered.    Signed, Lasalle Pointer, MD  12/12/2023, 10:43 AM

## 2023-12-13 ENCOUNTER — Other Ambulatory Visit: Payer: Self-pay

## 2023-12-13 ENCOUNTER — Encounter (HOSPITAL_COMMUNITY): Payer: Self-pay | Admitting: Family Medicine

## 2023-12-13 DIAGNOSIS — I214 Non-ST elevation (NSTEMI) myocardial infarction: Secondary | ICD-10-CM | POA: Diagnosis not present

## 2023-12-13 LAB — COMPREHENSIVE METABOLIC PANEL WITH GFR
ALT: 15 U/L (ref 0–44)
AST: 25 U/L (ref 15–41)
Albumin: 3.1 g/dL — ABNORMAL LOW (ref 3.5–5.0)
Alkaline Phosphatase: 64 U/L (ref 38–126)
Anion gap: 13 (ref 5–15)
BUN: 43 mg/dL — ABNORMAL HIGH (ref 8–23)
CO2: 27 mmol/L (ref 22–32)
Calcium: 9.8 mg/dL (ref 8.9–10.3)
Chloride: 100 mmol/L (ref 98–111)
Creatinine, Ser: 1.45 mg/dL — ABNORMAL HIGH (ref 0.44–1.00)
GFR, Estimated: 41 mL/min — ABNORMAL LOW (ref >60)
Glucose, Bld: 169 mg/dL — ABNORMAL HIGH (ref 70–99)
Potassium: 3.7 mmol/L (ref 3.5–5.1)
Sodium: 140 mmol/L (ref 135–145)
Total Bilirubin: 0.5 mg/dL (ref 0.0–1.2)
Total Protein: 7.3 g/dL (ref 6.5–8.1)

## 2023-12-13 LAB — GLUCOSE, CAPILLARY
Glucose-Capillary: 157 mg/dL — ABNORMAL HIGH (ref 70–99)
Glucose-Capillary: 245 mg/dL — ABNORMAL HIGH (ref 70–99)
Glucose-Capillary: 239 mg/dL — ABNORMAL HIGH (ref 70–99)
Glucose-Capillary: 162 mg/dL — ABNORMAL HIGH (ref 70–99)
Glucose-Capillary: 149 mg/dL — ABNORMAL HIGH (ref 70–99)

## 2023-12-13 LAB — MRSA NEXT GEN BY PCR, NASAL: MRSA by PCR Next Gen: NOT DETECTED

## 2023-12-13 LAB — PROTIME-INR
INR: 1 (ref 0.8–1.2)
Prothrombin Time: 13.3 s (ref 11.4–15.2)

## 2023-12-13 LAB — MAGNESIUM: Magnesium: 2 mg/dL (ref 1.7–2.4)

## 2023-12-13 LAB — PHOSPHORUS: Phosphorus: 4.1 mg/dL (ref 2.5–4.6)

## 2023-12-13 LAB — CBC
HCT: 43.7 % (ref 36.0–46.0)
Hemoglobin: 14.1 g/dL (ref 12.0–15.0)
MCH: 27.5 pg (ref 26.0–34.0)
MCHC: 32.3 g/dL (ref 30.0–36.0)
MCV: 85.2 fL (ref 80.0–100.0)
Platelets: 284 10*3/uL (ref 150–400)
RBC: 5.13 MIL/uL — ABNORMAL HIGH (ref 3.87–5.11)
RDW: 15 % (ref 11.5–15.5)
WBC: 9.2 10*3/uL (ref 4.0–10.5)
nRBC: 0 % (ref 0.0–0.2)

## 2023-12-13 MED ORDER — INSULIN ASPART 100 UNIT/ML IJ SOLN: 0.0000 [IU] | Freq: Every day | INTRAMUSCULAR | Status: DC

## 2023-12-13 MED ORDER — INSULIN ASPART 100 UNIT/ML IJ SOLN
3.0000 [IU] | Freq: Three times a day (TID) | INTRAMUSCULAR | Status: DC
  Administered 2023-12-14 (×3): 3 [IU] via SUBCUTANEOUS

## 2023-12-13 MED ORDER — INSULIN ASPART 100 UNIT/ML IJ SOLN
0.0000 [IU] | Freq: Three times a day (TID) | INTRAMUSCULAR | Status: DC
  Administered 2023-12-14: 5 [IU] via SUBCUTANEOUS
  Administered 2023-12-14 (×2): 3 [IU] via SUBCUTANEOUS

## 2023-12-13 MED ORDER — CEFADROXIL 500 MG PO CAPS
1000.0000 mg | ORAL_CAPSULE | Freq: Two times a day (BID) | ORAL | Status: DC
  Administered 2023-12-13 – 2023-12-14 (×3): 1000 mg via ORAL
  Filled 2023-12-13 (×4): qty 2

## 2023-12-13 NOTE — Plan of Care (Signed)
   Problem: Education: Goal: Knowledge of General Education information will improve Description Including pain rating scale, medication(s)/side effects and non-pharmacologic comfort measures Outcome: Progressing   Problem: Health Behavior/Discharge Planning: Goal: Ability to manage health-related needs will improve Outcome: Progressing

## 2023-12-13 NOTE — Evaluation (Signed)
 Physical Therapy Evaluation Patient Details Name: Kelly Gibson MRN: [id number] DOB: [birth year] Today's Date: 12/13/2023  History of Present Illness  Patient is a 63 y/o female admitted 12/11/23 from OSH in [place]  for NSTEMI, E.coli bacteremia, AKI with CT showing L kidney stones, and intermittent complete heart block.  PMH positive for HTN, CVA with residual R spastic hemiplegia, COPD, DM, depression.  Clinical Impression  Patient presents with decreased mobility due to generalized weakness, decreased balance and decreased activity tolerance.  Noted SOB with transfers though SpO2 90% or greater on RA.  She states she normally can complete most BADL's on her own and assists with some IADL's.  Spouse available to assist at d/c.  Today needing mod A for sit to stand and min A for stepping to recliner then back to bed.  Feel she will benefit from skilled PT in the acute setting and follow up HHPT at d/c.         If plan is discharge home, recommend the following: A little help with walking and/or transfers;A lot of help with bathing/dressing/bathroom;Assistance with cooking/housework;Assist for transportation;Help with stairs or ramp for entrance   Can travel by private vehicle        Equipment Recommendations None recommended by PT  Recommendations for Other Services       Functional Status Assessment Patient has had a recent decline in their functional status and demonstrates the ability to make significant improvements in function in a reasonable and predictable amount of time.     Precautions / Restrictions Precautions Precautions: Fall      Mobility  Bed Mobility Overal bed mobility: Needs Assistance Bed Mobility: Supine to Sit, Sit to Supine     Supine to sit: Min assist Sit to supine: Min assist   General bed mobility comments: HHA to lift trunk, assist for R LE into bed    Transfers Overall transfer level: Needs assistance   Transfers: Bed to  chair/wheelchair/BSC, Sit to/from Stand Sit to Stand: Mod assist   Step pivot transfers: Min assist       General transfer comment: some lifting help to stand and pt stepping to recliner then back to bed with A for balance    Ambulation/Gait                  Stairs            Wheelchair Mobility     Tilt Bed    Modified Rankin (Stroke Patients Only)       Balance Overall balance assessment: Needs assistance   Sitting balance-Leahy Scale: Good       Standing balance-Leahy Scale: Poor Standing balance comment: some assist for balance                             Pertinent Vitals/Pain Pain Assessment Pain Assessment: No/denies pain    Home Living Family/patient expects to be discharged to:: Private residence Living Arrangements: Spouse/significant other Available Help at Discharge: Family;Available 24 hours/day Type of Home: House Home Access: Level entry       Home Layout: One level Home Equipment: BSC/3in1;Wheelchair - manual      Prior Function Prior Level of Function : Needs assist             Mobility Comments: spouse assists at times for transfers OOB though pt relates she showers and dresses herself with adaptive equipment       Extremity/Trunk Assessment  Upper Extremity Assessment Upper Extremity Assessment: RUE deficits/detail RUE Deficits / Details: wrist and hand held in flexion and tight with increase extensor tone in arm    Lower Extremity Assessment Lower Extremity Assessment: RLE deficits/detail RLE Deficits / Details: extensor tone throughout, flexion only with A.    Cervical / Trunk Assessment Cervical / Trunk Assessment: Other exceptions Cervical / Trunk Exceptions: R trunk retraction  Communication   Communication Communication: Impaired Factors Affecting Communication: Difficulty expressing self (some dysarthria and other communication deficits)    Cognition Arousal: Alert Behavior During  Therapy: WFL for tasks assessed/performed                             Following commands: Intact       Cueing Cueing Techniques: Verbal cues     General Comments General comments (skin integrity, edema, etc.): VSS though pt dyspneic with transfers SpO2 90% or greater on RA    Exercises     Assessment/Plan    PT Assessment Patient needs continued PT services  PT Problem List Decreased strength;Decreased activity tolerance;Decreased balance;Decreased mobility       PT Treatment Interventions DME instruction;Patient/family education;Therapeutic activities;Therapeutic exercise;Balance training;Functional mobility training    PT Goals (Current goals can be found in the Care Plan section)  Acute Rehab PT Goals Patient Stated Goal: to return home PT Goal Formulation: With patient Time For Goal Achievement: 12/27/23 Potential to Achieve Goals: Good    Frequency Min 2X/week     Co-evaluation               AM-PAC PT "6 Clicks" Mobility  Outcome Measure Help needed turning from your back to your side while in a flat bed without using bedrails?: A Little Help needed moving from lying on your back to sitting on the side of a flat bed without using bedrails?: A Little Help needed moving to and from a bed to a chair (including a wheelchair)?: A Little Help needed standing up from a chair using your arms (e.g., wheelchair or bedside chair)?: A Lot Help needed to walk in hospital room?: Total Help needed climbing 3-5 steps with a railing? : Total 6 Click Score: 13    End of Session   Activity Tolerance: Patient tolerated treatment well Patient left: in bed;with call bell/phone within reach;with family/visitor present;with bed alarm set   PT Visit Diagnosis: Muscle weakness (generalized) (M62.81);Other abnormalities of gait and mobility (R26.89)    Time: 1610-9604 PT Time Calculation (min) (ACUTE ONLY): 28 min   Charges:   PT Evaluation $PT Eval Moderate  Complexity: 1 Mod PT Treatments $Therapeutic Activity: 8-22 mins PT General Charges $$ ACUTE PT VISIT: 1 Visit         Kelly Gibson, PT Acute Rehabilitation Services Office:[phone] 12/13/2023   Kelly Gibson 12/13/2023, 6:23 PM

## 2023-12-13 NOTE — Plan of Care (Signed)

## 2023-12-13 NOTE — Progress Notes (Signed)
 PROGRESS NOTE    Kelly Gibson  ZOX:[id number] DOB: 1960/09/14 DOA: 12/11/2023 PCP: Annella Kief, NP  Chief Complaint  Patient presents with   Code Sepsis   Heart Block   Pneumonia    Brief Narrative:   Kelly Gibson is Shanekia Latella 63 y.o. female with medical history significant of stroke with chronic R sided weakness, diabetes, hypertension and other medical issues.  She was transferred from OSH in [place]  for NSTEMI, e. Coli bacteremia, and intermittent complete heart block.    She was admitted for AHRF initially requiring bipap.  She was treated for COPD exacerbation and pneumonia.  Initially admitted to the ICU.  She was found to have an NSTEMI and acute kidney injury.  Sounds like they were planning for [facility] transfer for heart catherization, but she was found to have E. Coli bacteremia.  She's been treated medically for her NSTEMI with heparin /plavix /aspirin .  She was transferred out of the ICU on 5/15.  She was noted to have intermittent episodes of complete heart block as well, pacemaker was deferred until her infection was treated.    She was transferred to [facility] on 5/17 for additional care.  See prior notes, care everywhere for additional details.  Assessment & Plan:   Principal Problem:   Non-ST elevation (NSTEMI) myocardial infarction [facility]) Active Problems:   COPD (chronic obstructive pulmonary disease) (HCC)   Heart block AV complete (HCC)   Bacteremia   Coronary artery calcification   Preop cardiovascular exam   Sick sinus syndrome (HCC)   Pneumonia of left lower lobe due to infectious organism   Lactic acidosis  No discharge summary in transfer paperwork Reviewed most recent progress notes (ID, cardiology, and hospitalist) that were sent down and over care everywhere.  See care everywhere for detailed reports of labs/imaging/other studies done at OSH.    NSTEMI Troponin peaked >60,000  5/16 troponin 39,631  Echo 5/14 with EF 55%, grade II diastolic dysfunction,  anteroapical hypokinesis (see care everywhere report) Repeat troponins pending today Continue aspirin , plavix  on hold until decision regarding procedure for her hydro  Crestor  nitroglycerin  prn chest pain Avoiding beta blocker due to bradycardia below and hx COPD per OSH cardiologist Follow repeat limited echo per cards Will consult cardiology here, revascularization on hold due to treatment of bacteremia   Sinus Bradycardia Intermittent Complete Heart Block Tele with junctional brady overnight Per OSH patient had intermittent episodes of complete heart block Plan was to reassess need for pacemaker after infection treated, hold any AV nodal blockers Cardiology consult, appreciate assistance - continue to monitor    Sepsis due to E. Coli Bacteremia due to UTI with obstructing stone  Blood cultures from 5/14 notable for e. Coli, cefazolin  sensitive Urine culture with e. Coli  CT chest abd/pelvis at OSH with moderate L hydro - 1.6 cm stone alone L renal pelvis  CT renal protocol here with mild obstructive changes due to 15 mm stones In the setting of her e. Coli bacteremia/bacteremia/obstructing stone, she needs source control.   Discussed with urology who agrees and planning to discuss with IR  ok for any medically necessary procedures, if she were to require general anesthesia/open surgery, would need further risk stratification with additional cardiac testing Appreciate urology assistance   AKI on CKD IV Unclear baseline creatinine, in 2024, creatinine was 2.35 Creatinine 3.2 5/16 Creatinine today is 1.45 moderate hydro on CT abd/pelvis from OSH CT renal stone protocol shows L renal calculi with mild obstructive changes due to 15  mm stone in renal pelvis Appreciate urology assistance with obstructing stone   Acute Hypoxic Respiratory Failure COPD Exacerbation Community Acquired Pneumonia CT on 5/14 with left basilar consolidation concerning for pneumonia CT PE protocol with  "masslike consolidation within the LLL along with enlarged mediastinal and bilateral hilar LN's" - needs follow up to resolution to ensure no mass lesion  She was treated for COPD exacerbation at presentation No si/sx exacerbation now Wean O2 as able    HFpEF with Exacerbation Was diuresed at the OSH for concern for volume overload She appears euvolemic at this time Echo with preserved EF, grade 2 diastolic dysfunction, anteroapical hypokinesis (see care everywhere) Limited echo here with EF 50-55%, moderate LVH, grade 1 diastolic dysfunction Strict I/O, daily weights   Hx Stroke with Chronic R Sided Weakness Noted   Hypertension Hold amlodipine , lasix , benicar for now   T2DM Takes 22 units long acting in morning, 10 units at night and SSI for short acting Reduced basal - adjust as needed Tradjenta  SSI   Mood Disorder celexa    Apparently had tick on her side which was removed in the hospital, low suspicion that this is contributing to her current presentation -> NSTEMI and sepsis due e. Coli bacteremia in the setting of an obstructing stone.  They mentioned "being worked up for tick borne illness", but I don't see any labs (western blot) - they maybe pending?  Follow in care everywhere.  She received doxy at OSH, will d/c for now (she got 3 doses 100 mg, should be adequate for ppx dose which is 200 mg x1).     DVT prophylaxis: heparin  gtt Code Status: full Family Communication: husband 5/19 at bedside Disposition:   Status is: Inpatient Remains inpatient appropriate because: need for ongoing inpatient care   Consultants:  Cardiology urology  Procedures:  none  Antimicrobials:  Anti-infectives (From admission, onward)    Start     Dose/Rate Route Frequency Ordered Stop   12/13/23 1100  cefadroxil  (DURICEF) capsule 1,000 mg        1,000 mg Oral 2 times daily 12/13/23 1008     12/12/23 1900  ceFAZolin  (ANCEF ) IVPB 2g/100 mL premix  Status:  Discontinued        2  g 200 mL/hr over 30 Minutes Intravenous Every 8 hours 12/12/23 1448 12/13/23 1008   12/11/23 1915  ceFAZolin  (ANCEF ) IVPB 2g/100 mL premix  Status:  Discontinued        2 g 200 mL/hr over 30 Minutes Intravenous Every 12 hours 12/11/23 1826 12/12/23 1448       Subjective: No new complaints  Objective: Vitals:   12/13/23 0733 12/13/23 0851 12/13/23 1215 12/13/23 1645  BP: 114/79  (!) 119/54 (!) 148/86  Pulse: 88  (!) 54 63  Resp: 16  16 16   Temp: (!) 97.5 F (36.4 C)  (!) 97.4 F (36.3 C) 97.6 F (36.4 C)  TempSrc: Oral  Axillary Oral  SpO2: 100% 100% 99% 98%  Weight:      Height:        Intake/Output Summary (Last 24 hours) at 12/13/2023 1707 Last data filed at 12/13/2023 1646 Gross per 24 hour  Intake 120 ml  Output 700 ml  Net -580 ml   Filed Weights   12/11/23 1800  Weight: 88.4 kg    Examination:  General: No acute distress. Cardiovascular: brady Lungs: unlabored Abdomen: Soft, nontender, nondistended Neurological: Alert and oriented 3. Chronic R sided weakness. Extremities: No clubbing or cyanosis. No edema.  Data Reviewed: I have personally reviewed following labs and imaging studies  CBC: Recent Labs  Lab 12/11/23 1830 12/12/23 0438 12/13/23 0613  WBC 12.0* 13.2* 9.2  NEUTROABS 10.8*  --   --   HGB 13.7 12.8 14.1  HCT 42.3 39.8 43.7  MCV 85.1 85.6 85.2  PLT 266 285 284    Basic Metabolic Panel: Recent Labs  Lab 12/11/23 1830 12/12/23 0438 12/13/23 0613  NA 139 139 140  K 4.6 3.7 3.7  CL 95* 96* 100  CO2 26 30 27   GLUCOSE 276* 140* 169*  BUN 56* 54* 43*  CREATININE 2.31* 1.98* 1.45*  CALCIUM  9.9 9.8 9.8  MG  --   --  2.0  PHOS  --   --  4.1    GFR: Estimated Creatinine Clearance: 43 mL/min ([employer]shoe) (by C-G formula based on SCr of 1.45 mg/dL (H)).  Liver Function Tests: Recent Labs  Lab 12/11/23 1830 12/13/23 0613  AST 58* 25  ALT 35 15  ALKPHOS 79 64  BILITOT 0.5 0.5  PROT 8.3* 7.3  ALBUMIN 3.5 3.1*    CBG: Recent  Labs  Lab 12/12/23 1622 12/12/23 2057 12/13/23 0732 12/13/23 1212 12/13/23 1642  GLUCAP 214* 221* 157* 245* 239*     Recent Results (from the past 240 hours)  Culture, blood (Routine X 2) w Reflex to ID Panel     Status: None (Preliminary result)   Collection Time: 12/11/23  6:03 PM   Specimen: BLOOD  Result Value Ref Range Status   Specimen Description BLOOD BLOOD RIGHT HAND  Final   Special Requests   Final    BOTTLES DRAWN AEROBIC AND ANAEROBIC Blood Culture results may not be optimal due to an inadequate volume of blood received in culture bottles   Culture   Final    NO GROWTH 2 DAYS Performed at [facility], [address] [place], [place] [address]    Report Status PENDING  Incomplete  Culture, blood (Routine X 2) w Reflex to ID Panel     Status: None (Preliminary result)   Collection Time: 12/11/23  6:16 PM   Specimen: BLOOD LEFT HAND  Result Value Ref Range Status   Specimen Description BLOOD LEFT HAND  Final   Special Requests   Final    AEROBIC BOTTLE ONLY Blood Culture results may not be optimal due to an inadequate volume of blood received in culture bottles   Culture   Final    NO GROWTH 2 DAYS Performed at [facility], [address], [place], [place] [address]    Report Status PENDING  Incomplete  MRSA Next Gen by PCR, Nasal     Status: None   Collection Time: 12/13/23  8:53 AM   Specimen: Nasal Mucosa; Nasal Swab  Result Value Ref Range Status   MRSA by PCR Next Gen NOT DETECTED NOT DETECTED Final    Comment: (NOTE) The GeneXpert MRSA Assay (FDA approved for NASAL specimens only), is one component of [facility] comprehensive MRSA colonization surveillance program. It is not intended to diagnose MRSA infection nor to guide or monitor treatment for MRSA infections. Test performance is not FDA approved in patients less than 68 years old. Performed at [facility], [address] [place], [place] [address]          Radiology  Studies: ECHOCARDIOGRAM LIMITED Result Date: 12/12/2023    ECHOCARDIOGRAM LIMITED REPORT   Patient Name:   STEPHENIA VOGAN Date of Exam:  12/12/2023 Medical Rec #:  [id number]         Height:       63.0 in Accession #:    [id number]        Weight:       194.9 lb Date of Birth:  24-Jul-1961        BSA:          1.912 m Patient Age:    62 years          BP:           157/64 mmHg Patient Gender: F                 HR:           49 bpm. Exam Location:  Inpatient Procedure: 2D Echo, Limited Echo, Cardiac Doppler and Color Doppler (Both            Spectral and Color Flow Doppler were utilized during procedure). Indications:    NSTEMI  History:        Patient has prior history of Echocardiogram examinations, most                 recent 10/27/2017.  Sonographer:    Janette Medley Referring Phys: [id number] KAMAL H HENDERSON IMPRESSIONS  1. Left ventricular ejection fraction, by estimation, is 50 to 55%. The left ventricle has low normal function. The left ventricle demonstrates regional wall motion abnormalities (see scoring diagram/findings for description). There is moderate left ventricular hypertrophy. Left ventricular diastolic parameters are consistent with Grade I diastolic dysfunction (impaired relaxation).  2. The aortic valve is calcified. There is moderate calcification of the aortic valve. Aortic valve regurgitation is not visualized.  3. The inferior vena cava is normal in size with greater than 50% respiratory variability, suggesting right atrial pressure of 3 mmHg. FINDINGS  Left Ventricle: Left ventricular ejection fraction, by estimation, is 50 to 55%. The left ventricle has low normal function. The left ventricle demonstrates regional wall motion abnormalities. There is moderate left ventricular hypertrophy. Left ventricular diastolic parameters are consistent with Grade I diastolic dysfunction (impaired relaxation).  LV Wall Scoring: The inferior wall and mid inferolateral segment are akinetic. Left Atrium: Left  atrial size was normal in size. Aortic Valve: The aortic valve is calcified. There is moderate calcification of the aortic valve. Aortic valve regurgitation is not visualized. Aorta: The aortic root and ascending aorta are structurally normal, with no evidence of dilitation. Venous: The inferior vena cava is normal in size with greater than 50% respiratory variability, suggesting right atrial pressure of 3 mmHg. LEFT VENTRICLE PLAX 2D LVIDd:         4.20 cm   Diastology LVIDs:         2.70 cm   LV e' medial:   6.96 cm/s LV PW:         1.20 cm   LV E/e' medial: 13.9 LV IVS:        1.40 cm LVOT diam:     2.00 cm LVOT Area:     3.14 cm  RIGHT VENTRICLE            IVC RV S prime:     9.46 cm/s  IVC diam: 1.20 cm TAPSE (M-mode): 2.0 cm LEFT ATRIUM         Index LA diam:    3.50 cm 1.83 cm/m   AORTA Ao Root diam: 3.00 cm Ao Asc diam:  3.20 cm MITRAL VALVE MV Area (PHT): 2.26 cm  SHUNTS MV Decel Time: 335 msec     Systemic Diam: 2.00 cm MV E velocity: 96.40 cm/s MV Braelynn Lupton velocity: 113.00 cm/s MV E/Josalynn Johndrow ratio:  0.85 Dorothye Gathers MD Electronically signed by Dorothye Gathers MD Signature Date/Time: 12/12/2023/11:47:27 AM    Final    DG Chest 2 View Result Date: 12/12/2023 CLINICAL DATA:  63 year old female code sepsis.  Cough.  NSTEMI. EXAM: CHEST - 2 VIEW COMPARISON:  Portable chest yesterday and earlier. CT Abdomen and Pelvis this morning. FINDINGS: Confluent left lower lobe, retrocardiac opacity seen on CT this morning. Lung volumes and mediastinal contours stable from yesterday. Linear thickening or scarring along the right minor fissure is stable. No pneumothorax or pulmonary edema. Stable lung volumes and mediastinal contours. Decreased bowel gas in the upper abdomen. Stable visualized osseous structures. IMPRESSION: 1. Left lower lobe Pneumonia vs.mass, visible on CT Abdomen and Pelvis this morning. No pleural effusion. Follow-up will be necessary to exclude Tumor. 2. Linear scarring or pleural thickening along the right  minor fissure. 3. No other cardiopulmonary abnormality. Electronically Signed   By: Marlise Simpers M.D.   On: 12/12/2023 09:52   CT RENAL STONE STUDY Result Date: 12/12/2023 CLINICAL DATA:  History of nephrolithiasis with flank pain, initial encounter EXAM: CT ABDOMEN AND PELVIS WITHOUT CONTRAST TECHNIQUE: Multidetector CT imaging of the abdomen and pelvis was performed following the standard protocol without IV contrast. RADIATION DOSE REDUCTION: This exam was performed according to the departmental dose-optimization program which includes automated exposure control, adjustment of the mA and/or kV according to patient size and/or use of iterative reconstruction technique. COMPARISON:  Ultrasound from 10/10/2021 FINDINGS: Lower chest: Chronic area of consolidation in the left lower lobe. Hepatobiliary: Cholelithiasis is noted. The liver is within normal limits. Pancreas: Unremarkable. No pancreatic ductal dilatation or surrounding inflammatory changes. Spleen: Normal in size without focal abnormality. Adrenals/Urinary Tract: Adrenal glands are within normal limits. Kidneys are well visualized bilaterally. Multiple renal calculi are noted on the left measuring up to 14 mm in the left renal pelvis. Mild fullness of the collecting system is noted secondary to the renal pelvic stone. The more distal left ureter is within normal limits. The bladder is within normal limits. Stomach/Bowel: Scattered fecal material is noted throughout the colon. No obstructive or inflammatory changes are noted. The appendix is within normal limits. No obstructive or inflammatory changes of the small bowel are seen. Stomach is within normal limits. Vascular/Lymphatic: Aortic atherosclerosis. No enlarged abdominal or pelvic lymph nodes. Reproductive: Uterus and bilateral adnexa are unremarkable. Other: No abdominal wall hernia or abnormality. No abdominopelvic ascites. Musculoskeletal: No acute or significant osseous findings. IMPRESSION: Left  renal calculi with mild obstructive changes secondary to the 15 mm stone in the renal pelvis. No distal obstructive changes are seen. Cholelithiasis without complicating factors. Chronic consolidation in the left lower lobe. Electronically Signed   By: Violeta Grey M.D.   On: 12/12/2023 01:54   DG CHEST PORT 1 VIEW Result Date: 12/11/2023 CLINICAL DATA:  10026 Shortness of breath 10026 EXAM: PORTABLE CHEST 1 VIEW COMPARISON:  May 10, 2023 FINDINGS: The cardiomediastinal silhouette is unchanged and enlarged in contour.There is fullness of the RIGHT paratracheal stripe no pleural effusion. No pneumothorax. Band like opacities in the bilateral perihilar regions, most likely atelectasis. IMPRESSION: There is fullness of the RIGHT paratracheal stripe. This could reflect adenopathy versus summation artifact. Recommend repeat PA and lateral chest radiograph for improved evaluation when clinically appropriate. Electronically Signed   By: Clancy Crimes M.D.  On: 12/11/2023 17:35        Scheduled Meds:  aspirin  EC  81 mg Oral Daily   budesonide -glycopyrrolate -formoterol   2 puff Inhalation BID   cefadroxil   1,000 mg Oral BID   citalopram   10 mg Oral Daily   insulin  aspart  0-5 Units Subcutaneous QHS   insulin  aspart  0-9 Units Subcutaneous TID WC   insulin  glargine-yfgn  7 Units Subcutaneous BID   linagliptin   5 mg Oral Daily   polyethylene glycol  17 g Oral BID   rosuvastatin   40 mg Oral QHS   Continuous Infusions:     LOS: 2 days    Time spent: over 30 min    Donnetta Gains, MD [facility]   To contact the attending provider between 7A-7P or the covering provider during after hours 7P-7A, please log into the web site [email or web address] and access using universal [place] password for that web site. If you do not have the password, please call the hospital operator.  12/13/2023, 5:07 PM

## 2023-12-13 NOTE — Inpatient Diabetes Management (Signed)
 Inpatient Diabetes Program Recommendations  AACE/ADA: New Consensus Statement on Inpatient Glycemic Control (2015)  Target Ranges:  Prepandial:   less than 140 mg/dL      Peak postprandial:   less than 180 mg/dL (1-2 hours)      Critically ill patients:  140 - 180 mg/dL   Lab Results  Component Value Date   GLUCAP 157 (H) 12/13/2023   HGBA1C 8.0 (H) 12/11/2023    Review of Glycemic Control  Latest Reference Range & Units 12/12/23 07:51 12/12/23 11:25 12/12/23 16:22 12/12/23 20:57 12/13/23 07:32  Glucose-Capillary 70 - 99 mg/dL 161 (H) 096 (H) 045 (H) 221 (H) 157 (H)   Diabetes history: DM 2 Outpatient Diabetes medications: Humalog  2-10 units tid, Tresiba  22 units qam, 10 units qhs Current orders for Inpatient glycemic control:  Tradjenta  5 mg Daily Novolog  0-9 units tid + hs Semglee  7 units bid  A1c 8% on 5/17 Note: Glucose trends increase after PO intake  Inpatient Diabetes Program Recommendations:    -   consider starting Novolog  2 units tid meal coverage if eating >50% of meals  Thanks,  Eloise Hake RN, MSN, BC-ADM Inpatient Diabetes Coordinator Team Pager [phone] (8a-5p)

## 2023-12-13 NOTE — Progress Notes (Signed)
 Interventional Radiology Brief Note:  Patient recently diagnosed with an NSTEMI at OSH then transferred to [facility] for ongoing intervention.  By all accounts, does not appear she underwent PCI/stent at that time.  She is on aspirin  325mg , Plavix  75mg  daily. She also has a non-obstructing 7mm stone with minimal hydronephrosis for which Urology has requested a left PCN. Since admission, her WBC has improved, her Cr has improved and appears it may be elevated at baseline. Low suspicion at present for problematic obstruction.  In the absence of an emergent need to proceed ideally she would be able hold her DAPT. If it is determined that her stone is not a true source of acute infection and can be addressed as an outpatient,  best plan would be to coordinate with Urology to be done at the time of a planned OR extraction instead of committing her to a long-term drain until that time.   If she were to develop more concerning features indicative of urgent placement please re-consult IR.   Hospitalist made aware.  Devansh Riese, MS RD PA-C

## 2023-12-13 NOTE — Progress Notes (Addendum)
 Patient Name: Kelly Gibson Date of Encounter: 12/13/2023 [facility] Cardiologist: Hazle Lites, MD   Interval Summary  .    Sitting up in chair, no complaints. Husband at the bedside.   Vital Signs .    Vitals:   12/12/23 2058 12/12/23 2354 12/13/23 0500 12/13/23 0733  BP:  (!) 150/69 (!) 158/67 114/79  Pulse:  (!) 58  88  Resp:  18 18 16   Temp:  97.6 F (36.4 C) (!) 97.5 F (36.4 C) (!) 97.5 F (36.4 C)  TempSrc:  Oral Oral Oral  SpO2: 97% 97%  100%  Weight:      Height:        Intake/Output Summary (Last 24 hours) at 12/13/2023 0835 Last data filed at 12/13/2023 0814 Gross per 24 hour  Intake 632.83 ml  Output 900 ml  Net -267.17 ml      12/11/2023    6:00 PM 04/08/2023    3:00 PM 11/16/2022    4:15 PM  Last 3 Weights  Weight (lbs) 194 lb 14.2 oz 194 lb 9.6 oz --  Weight (kg) 88.4 kg 88.27 kg --      Telemetry/ECG    Sinus Rhythm, long 1st degree AVB with intermittent junctional beats - Personally Reviewed  Physical Exam .    GEN: No acute distress.   Neck: No JVD Cardiac: RRR, no murmurs, rubs, or gallops.  Respiratory: Rhonchi in bases GI: Soft, nontender, non-distended  MS: No edema, residual right UE/LE paralysis from prior CVA  Assessment & Plan .     63 y.o. female with a significant past medical history of asymptomatic bradycardia with sinus pauses during sleep, likely recent myocardial infarction, prior cerebrovascular accidents, hypertension, type II diabetes, COPD,  who was seen 12/11/2023 for the evaluation of likely recent myocardial infarction and possible paroxysmal heart block at the request of Dr. Ada Acres.   NSTEMI Multivessel coronary artery calcifications -- felt likely Type II but unable to rule out significant CAD (did have extensive multivessel coronary calcifications on prior CT), in the setting of acute illness with bacteremia/sepsis likely GU in nature while at hospital in [place] . Denies any chest pain during  admission, troponins reported to peak > 60,000. Now trending down 15124 here. Denies any chest pain prior to or during admission -- echo from outside hospital normal LVEF, G2 DD, anteroapical hypokinesis and no significant valvular heart disease. Repeat limited echo 5/18 with LVEF of 50-55%, g1DD, moderate LVH, inferior and mid inferolateral akinesis, no significant valvular disease  -- heparin  stopped yesterday (received a total of 4 days), continue ASA 81mg  daily, statin. Plavix  held with plans for possible nephrostomy tube placement?, but will need to consider resuming prior to discharge once plans are determined.    Sinus Bradycardia/1st degree AVB Junctional bradycardia -- reports of intermittent complete heart block at OSH -- asymptomatic, avoiding AV nodal agents -- continue to monitor, may consider outpatient monitor at DC  Left partial staghorn with mild hydronephrosis -- noted on CT, but no evidence of acute renal obstruction  -- planned for left nephrostomy tube placement today  Sepsis 2/2 E Coli bacteremia w/ UTI and obstructive stone -- BC + for  E. Col, urine culture + E coli -- see above, management per primary/urology  AKI on CKD IV -- Cr 3.2 prior arrival, improved to 1.45 (baseline appears 1.3-1.4)  Per primary Acute hypoxic respiratory failure COPD CAP Stroke DM HTN  For questions or updates, please contact [facility]  Please consult [email or web address] for contact info under        Signed, Kelly Nailer, NP   Patient seen, examined. Available data reviewed. Agree with findings, assessment, and plan as outlined by Kelly Nailer, NP.  The patient is independently interviewed and examined.  Her husband is present at the bedside.  She is alert, oriented, in no distress.  Neck with normal JVP, lung fields are clear, heart is regular rate and rhythm with no murmur or gallop, abdomen is soft and nontender, extremities have no edema, right upper and lower  extremity paralysis noted.  Medically complicated patient with hospitalization for E. coli bacteremia and sepsis COPD with pneumonia and acute respiratory failure, and non-STEMI by cardiac enzyme assessment.  She had a large enzyme rise but never had chest pain or pressure.  She ultimately was transferred from [place]  to [facility] for further care.  I have carefully reviewed her history.  The patient is essentially wheelchair-bound after a stroke that occurred about 6 years ago and left her with right hemiparesis.  She is now off of heparin .  Considering her baseline functional status with significant physical impairment from her stroke, as well as her multiple medical issues at present, I do not think she is a good candidate for invasive evaluation with cardiac catheterization.  In addition, she has really had no chest pain or pressure and appears clinically stable.  I would recommend treating her medically for presumed CAD and non-STEMI.  Will hold P2 Y12 inhibitors until there is a decision made about how to address her renal stone with associated hydronephrosis.  Deferred to the medicine team regarding management of her infectious issues.  Regarding her cardiac risk of surgery, it would be appropriate to proceed with any medically necessary procedures, especially if they can be approached with percutaneous intervention such as nephrostomy tube placement.  If she requires general anesthesia and an open surgery, will likely need to risk stratify her with further cardiac testing.  Once she recovers from her acute illness, an outpatient pharmacologic stress test would be appropriate and I discussed this with the patient and her husband today.  Otherwise, as outlined above.  Arnoldo Lapping, M.D. 12/13/2023 11:42 AM

## 2023-12-14 ENCOUNTER — Other Ambulatory Visit (HOSPITAL_COMMUNITY): Payer: Self-pay

## 2023-12-14 ENCOUNTER — Other Ambulatory Visit: Payer: Self-pay | Admitting: Cardiology

## 2023-12-14 DIAGNOSIS — I214 Non-ST elevation (NSTEMI) myocardial infarction: Secondary | ICD-10-CM

## 2023-12-14 LAB — GLUCOSE, CAPILLARY
Glucose-Capillary: 186 mg/dL — ABNORMAL HIGH (ref 70–99)
Glucose-Capillary: 203 mg/dL — ABNORMAL HIGH (ref 70–99)
Glucose-Capillary: 195 mg/dL — ABNORMAL HIGH (ref 70–99)

## 2023-12-14 LAB — CBC
HCT: 47.4 % — ABNORMAL HIGH (ref 36.0–46.0)
Hemoglobin: 15.3 g/dL — ABNORMAL HIGH (ref 12.0–15.0)
MCH: 27.4 pg (ref 26.0–34.0)
MCHC: 32.3 g/dL (ref 30.0–36.0)
MCV: 84.9 fL (ref 80.0–100.0)
Platelets: 327 10*3/uL (ref 150–400)
RBC: 5.58 MIL/uL — ABNORMAL HIGH (ref 3.87–5.11)
RDW: 15.2 % (ref 11.5–15.5)
WBC: 11.7 10*3/uL — ABNORMAL HIGH (ref 4.0–10.5)
nRBC: 0 % (ref 0.0–0.2)

## 2023-12-14 LAB — COMPREHENSIVE METABOLIC PANEL WITH GFR
ALT: 12 U/L (ref 0–44)
AST: 25 U/L (ref 15–41)
Albumin: 3.2 g/dL — ABNORMAL LOW (ref 3.5–5.0)
Alkaline Phosphatase: 62 U/L (ref 38–126)
Anion gap: 16 — ABNORMAL HIGH (ref 5–15)
BUN: 43 mg/dL — ABNORMAL HIGH (ref 8–23)
CO2: 28 mmol/L (ref 22–32)
Calcium: 10.3 mg/dL (ref 8.9–10.3)
Chloride: 97 mmol/L — ABNORMAL LOW (ref 98–111)
Creatinine, Ser: 1.49 mg/dL — ABNORMAL HIGH (ref 0.44–1.00)
GFR, Estimated: 40 mL/min — ABNORMAL LOW (ref >60)
Glucose, Bld: 162 mg/dL — ABNORMAL HIGH (ref 70–99)
Potassium: 3.7 mmol/L (ref 3.5–5.1)
Sodium: 141 mmol/L (ref 135–145)
Total Bilirubin: 0.7 mg/dL (ref 0.0–1.2)
Total Protein: 7.7 g/dL (ref 6.5–8.1)

## 2023-12-14 LAB — PHOSPHORUS: Phosphorus: 3.9 mg/dL (ref 2.5–4.6)

## 2023-12-14 LAB — LIPOPROTEIN A (LPA): Lipoprotein (a): 422.6 nmol/L — ABNORMAL HIGH (ref <75.0)

## 2023-12-14 LAB — MAGNESIUM: Magnesium: 2.2 mg/dL (ref 1.7–2.4)

## 2023-12-14 MED ORDER — BREZTRI AEROSPHERE 160-9-4.8 MCG/ACT IN AERO
2.0000 | INHALATION_SPRAY | Freq: Two times a day (BID) | RESPIRATORY_TRACT | 1 refills | Status: AC
  Filled 2023-12-14: qty 10.7, 30d supply, fill #0

## 2023-12-14 MED ORDER — ALBUTEROL SULFATE HFA 108 (90 BASE) MCG/ACT IN AERS
2.0000 | INHALATION_SPRAY | Freq: Four times a day (QID) | RESPIRATORY_TRACT | 2 refills | Status: AC | PRN
  Filled 2023-12-14: qty 18, 30d supply, fill #0

## 2023-12-14 MED ORDER — ASPIRIN 81 MG PO TBEC
81.0000 mg | DELAYED_RELEASE_TABLET | Freq: Every day | ORAL | 12 refills | Status: AC
  Filled 2023-12-14: qty 30, 30d supply, fill #0

## 2023-12-14 MED ORDER — POTASSIUM CHLORIDE CRYS ER 20 MEQ PO TBCR
20.0000 meq | EXTENDED_RELEASE_TABLET | Freq: Once | ORAL | Status: AC
  Administered 2023-12-14: 20 meq via ORAL
  Filled 2023-12-14: qty 1

## 2023-12-14 MED ORDER — CEFADROXIL 500 MG PO CAPS
1000.0000 mg | ORAL_CAPSULE | Freq: Two times a day (BID) | ORAL | 0 refills | Status: AC
  Filled 2023-12-14: qty 60, 15d supply, fill #0

## 2023-12-14 NOTE — Progress Notes (Addendum)
 Patient Name: Kelly Gibson Date of Encounter: 12/14/2023 [facility] Cardiologist: Hazle Lites, MD   Interval Summary  .    No complaints this morning.   Vital Signs .    Vitals:   12/13/23 2122 12/13/23 2349 12/14/23 0440 12/14/23 0740  BP:  (!) 157/82 (!) 153/82 99/73  Pulse: (!) 58  82 94  Resp: 16 16 16 16   Temp:  97.7 F (36.5 C) (!) 97.5 F (36.4 C) 97.6 F (36.4 C)  TempSrc:  Oral Oral Oral  SpO2: 95%  94% 97%  Weight:      Height:        Intake/Output Summary (Last 24 hours) at 12/14/2023 0851 Last data filed at 12/14/2023 0442 Gross per 24 hour  Intake 120 ml  Output 450 ml  Net -330 ml      12/11/2023    6:00 PM 04/08/2023    3:00 PM 11/16/2022    4:15 PM  Last 3 Weights  Weight (lbs) 194 lb 14.2 oz 194 lb 9.6 oz --  Weight (kg) 88.4 kg 88.27 kg --      Telemetry/ECG    Sinus Rhythm 1st degree AVB - Personally Reviewed  Physical Exam .    GEN: No acute distress.   Neck: No JVD Cardiac: RRR, no murmurs, rubs, or gallops.  Respiratory: Clear to auscultation bilaterally. GI: Soft, nontender, non-distended  MS: No edema, residual right UE/LE paralysis from prior CVA  Assessment & Plan .     63 y.o. female with a significant past medical history of asymptomatic bradycardia with sinus pauses during sleep, likely recent myocardial infarction, prior cerebrovascular accidents, hypertension, type II diabetes, COPD,  who was seen 12/11/2023 for the evaluation of likely recent myocardial infarction and possible paroxysmal heart block at the request of Dr. Ada Acres.    NSTEMI Multivessel coronary artery calcifications -- felt likely Type II but unable to rule out significant CAD (did have extensive multivessel coronary calcifications on prior CT), in the setting of acute illness with bacteremia/sepsis likely GU in nature while at hospital in [place] . Denies any chest pain during admission, troponins reported to peak > 60,000. Now trending down  15124 here. Denies any chest pain prior to or during admission -- echo from outside hospital normal LVEF, G2 DD, anteroapical hypokinesis and no significant valvular heart disease. Repeat limited echo 5/18 with LVEF of 50-55%, g1DD, moderate LVH, inferior and mid inferolateral akinesis, no significant valvular disease  -- heparin  stopped 5/18 (received a total of 4 days),  -- Will continue to treat medically at this time, unless she requires general anesthesia -- continue ASA 81mg  daily, statin. Plavix  held with ongoing discussion regarding nephrostomy tube placement, but will need to resume prior to discharge once plans are determined.   -- plan for outpatient stress testing    Sinus Bradycardia/1st degree AVB Junctional bradycardia -- reports of intermittent complete heart block at OSH (none on telemetry here) -- asymptomatic, avoiding AV nodal agents -- continue to monitor, may consider outpatient monitor at [place]   Left partial staghorn with mild hydronephrosis -- noted on CT, but no evidence of acute renal obstruction  -- management per urology/IR   Sepsis 2/2 E Coli bacteremia w/ UTI and obstructive stone -- BC + for  E. Col, urine culture + E coli -- see above, management per primary/urology   AKI on CKD IV -- Cr 3.2 prior arrival, improved to 1.49 (baseline appears 1.3-1.4)   Per primary Acute hypoxic  respiratory failure COPD CAP Stroke DM HTN  For questions or updates, please contact  [facility] Please consult [email or web address] for contact info under        Signed, Johnie Nailer, NP   Patient seen, examined. Available data reviewed. Agree with findings, assessment, and plan as outlined by Johnie Nailer, NP.  On exam the patient is alert, oriented, in no distress.  HEENT is normal, JVP is normal, lungs clear bilaterally, heart is regular rate and rhythm no murmur gallop, abdomen is soft, obese, nontender, extremities have no edema.  Right-sided hemiparesis  noted.  Reviewed urology notes and discussions with interventional radiology.  It appears that there will be no acute intervention during her hospitalization.  She may require surgical intervention as an outpatient.  Fortunately she is having no ischemic cardiac symptoms.  Please see yesterday's notes for full discussion.  While we would normally put her on DAPT with aspirin  and clopidogrel , I would favor treating her with aspirin  alone as she might require some urgent intervention and we would want to minimize her bleeding risk is much as possible.  Will arrange an outpatient Lexiscan Myoview stress test for further risk stratification of her presumed ischemic heart disease.  Unless she has high risk findings, I would plan for ongoing medical management.  Our team will sign off today.  We will arrange close outpatient follow-up with cardiology including a pharmacologic stress test.  Her current regimen includes aspirin  and high intensity statin drug with rosuvastatin  40 mg daily.  Arnoldo Lapping, M.D. 12/14/2023 11:03 AM

## 2023-12-14 NOTE — Progress Notes (Signed)
 Myoview ordered per Dr. Arlester Ladd

## 2023-12-14 NOTE — Evaluation (Signed)
 Occupational Therapy Evaluation Patient Details Name: Kelly Gibson MRN: [id number] DOB: [birth year] Today's Date: 12/14/2023   History of Present Illness   Patient is a 63 y/o female admitted 12/11/23 from OSH in [place]  for NSTEMI, E.coli bacteremia, AKI with CT showing L kidney stones, and intermittent complete heart block.  PMH positive for HTN, CVA with residual R spastic hemiplegia, COPD, DM, depression.     Clinical Impressions Pt presented with husband in the room. Pt reported at PLOF they mainly complete transfers from bed to [facility] and then to [facility] if needed. Pt does complete some ambulation with hemi walker at home level. Pt at this time completed bed mobility with min assist, sit to stand with CGA and step pivot to chair with min assist. Pt  then completed UE bathing with min assist while sitting. At this time recommendation for [facility] at this time.      If plan is discharge home, recommend the following:   A little help with walking and/or transfers;A little help with bathing/dressing/bathroom;Assistance with cooking/housework;Assist for transportation;Help with stairs or ramp for entrance     Functional Status Assessment   Patient has had a recent decline in their functional status and demonstrates the ability to make significant improvements in function in a reasonable and predictable amount of time.     Equipment Recommendations   None recommended by OT     Recommendations for Other Services         Precautions/Restrictions   Precautions Precautions: Fall Restrictions Weight Bearing Restrictions Per Provider Order: No     Mobility Bed Mobility Overal bed mobility: Needs Assistance Bed Mobility: Supine to Sit     Supine to sit: Min assist          Transfers Overall transfer level: Needs assistance Equipment used: 1 person hand held assist Transfers: Sit to/from Stand Sit to Stand: Contact guard assist     Step pivot transfers: Min assist             Balance Overall balance assessment: Needs assistance Sitting-balance support: Feet supported, Single extremity supported Sitting balance-Leahy Scale: Fair   Postural control: Posterior lean Standing balance support: Single extremity supported, No upper extremity supported Standing balance-Leahy Scale: Poor                             ADL either performed or assessed with clinical judgement   ADL Overall ADL's : Needs assistance/impaired Eating/Feeding: Set up;Sitting   Grooming: Wash/dry face;Set up;Sitting   Upper Body Bathing: Minimal assistance;Sitting   Lower Body Bathing: Minimal assistance;Sit to/from stand;Sitting/lateral leans   Upper Body Dressing : Minimal assistance;Sitting   Lower Body Dressing: Minimal assistance;Sit to/from stand;Sitting/lateral leans   Toilet Transfer: Contact guard assist;Cueing for safety;Cueing for sequencing Toilet Transfer Details (indicate cue type and reason): step pivoted to L side simulated with chair Toileting- Clothing Manipulation and Hygiene: Minimal assistance;Sitting/lateral lean       Functional mobility during ADLs: Minimal assistance       Vision Baseline Vision/History: 0 No visual deficits Ability to See in Adequate Light: 0 Adequate Patient Visual Report: No change from baseline Vision Assessment?: No apparent visual deficits     Perception         Praxis         Pertinent Vitals/Pain Pain Assessment Pain Assessment: No/denies pain     Extremity/Trunk Assessment Upper Extremity Assessment Upper Extremity Assessment: Right hand dominant;RUE deficits/detail RUE Deficits /  Details: wrist and hand held in flexion and tight with increase extensor tone in arm RUE Sensation: decreased light touch;decreased proprioception RUE Coordination: decreased fine motor;decreased gross motor   Lower Extremity Assessment Lower Extremity Assessment: Defer to PT evaluation   Cervical / Trunk  Assessment Cervical / Trunk Assessment: Other exceptions Cervical / Trunk Exceptions: R trunk retraction   Communication Communication Communication: Impaired Factors Affecting Communication: Difficulty expressing self   Cognition Arousal: Alert Behavior During Therapy: WFL for tasks assessed/performed                                 Following commands: Intact       Cueing  General Comments   Cueing Techniques: Verbal cues      Exercises     Shoulder Instructions      Home Living Family/patient expects to be discharged to:: Private residence Living Arrangements: Spouse/significant other Available Help at Discharge: Family;Available 24 hours/day Type of Home: House Home Access: Level entry     Home Layout: Two level;Able to live on main level with bedroom/bathroom;Full bath on main level Alternate Level Stairs-Number of Steps: has a chair lift on stairs but does not go up them   Bathroom Shower/Tub: Sponge bathes at baseline   [employer]: Standard     Home Equipment: BSC/3in1;Wheelchair - manual;Shower seat;Toilet riser;Adaptive equipment (hemi walker, working on getting permant shower grab bars) Adaptive Equipment: Reacher;Long-handled sponge Additional Comments: R ankle brace-family not sure of name of it      Prior Functioning/Environment Prior Level of Function : Needs assist             Mobility Comments: spouse assists at times for transfers OOB though pt relates she showers and dresses herself with adaptive equipment ADLs Comments: spouse assist as needed    OT Problem List: Decreased strength;Decreased range of motion;Decreased activity tolerance;Impaired balance (sitting and/or standing);Decreased knowledge of use of DME or AE;Decreased safety awareness;Cardiopulmonary status limiting activity   OT Treatment/Interventions: Self-care/ADL training;Therapeutic exercise;Therapeutic activities;Balance training;Patient/family  education      OT Goals(Current goals can be found in the care plan section)   Acute Rehab OT Goals Patient Stated Goal: to return home OT Goal Formulation: With patient Time For Goal Achievement: 12/28/23 Potential to Achieve Goals: Good   OT Frequency:  Min 1X/week    Co-evaluation              AM-PAC OT "6 Clicks" Daily Activity     Outcome Measure Help from another person eating meals?: A Little Help from another person taking care of personal grooming?: A Little Help from another person toileting, which includes using toliet, bedpan, or urinal?: A Little Help from another person bathing (including washing, rinsing, drying)?: A Little Help from another person to put on and taking off regular upper body clothing?: A Little Help from another person to put on and taking off regular lower body clothing?: A Little 6 Click Score: 18   End of Session Equipment Utilized During Treatment: Gait belt;Rolling walker (2 wheels) Nurse Communication: Mobility status  Activity Tolerance: Patient tolerated treatment well Patient left: in chair;with call bell/phone within reach;with chair alarm set  OT Visit Diagnosis: Unsteadiness on feet (R26.81);Other abnormalities of gait and mobility (R26.89);Repeated falls (R29.6);Muscle weakness (generalized) (M62.81)                Time: 0720-0809 OT Time Calculation (min): 49 min Charges:  OT General Charges $  OT Visit: 1 Visit OT Evaluation $OT Eval Low Complexity: 1 Low OT Treatments $Self Care/Home Management : 23-37 mins  Erving Heather OTR/L  Acute Rehab Services  [phone] office number   Stevphen Elders 12/14/2023, 9:21 AM

## 2023-12-14 NOTE — Plan of Care (Signed)
   Problem: Education: Goal: Knowledge of General Education information will improve Description Including pain rating scale, medication(s)/side effects and non-pharmacologic comfort measures Outcome: Progressing

## 2023-12-14 NOTE — Plan of Care (Signed)

## 2023-12-14 NOTE — Discharge Summary (Addendum)
 Physician Discharge Summary  Kelly Gibson:[id number] DOB: 03/26/1961 DOA: 12/11/2023  PCP: Annella Kief, NP  Admit date: 12/11/2023 Discharge date: 12/14/2023  Time spent: 40 minutes  Recommendations for Outpatient Follow-up:  Follow outpatient CBC/CMP  Follow with cardiology outpatient - stress test outpatient per cards Follow with urology outpatient -  percutaneous nephrolithotomy 6/4  Needs repeat CT chest within 1 month (OSH CT chest with masslike consolidation within the LLL along with enlarged mediastinal and bilateral hilar LN's) Follow COPD outpatient - refilled breztri /albuterol  Follow blood pressure outpatient - benicar/albuterol  on hold with soft BP's here Follow blood sugars outpatient   Discharge Diagnoses:  Principal Problem:   Non-ST elevation (NSTEMI) myocardial infarction [facility]) Active Problems:   COPD (chronic obstructive pulmonary disease) (HCC)   Heart block AV complete (HCC)   Bacteremia   Coronary artery calcification   Preop cardiovascular exam   Sick sinus syndrome (HCC)   Pneumonia of left lower lobe due to infectious organism   Lactic acidosis   Discharge Condition: stable  Diet recommendation: heart healthy, diabetic  Filed Weights   12/11/23 1800  Weight: 88.4 kg    History of present illness:   Kelly Gibson is Kelly Gibson 63 y.o. female with medical history significant of stroke with chronic R sided weakness, diabetes, hypertension and other medical issues.  She was transferred from OSH in [place]  for NSTEMI, e. Coli bacteremia, and intermittent complete heart block.     She was admitted for AHRF initially requiring bipap.  She was treated for COPD exacerbation and pneumonia.  Initially admitted to the ICU.  She was found to have an NSTEMI and acute kidney injury.  Sounds like they were planning for Kelly Gibson transfer for heart catherization, but she was found to have E. Coli bacteremia.  She's been treated medically for her NSTEMI with  heparin /plavix /aspirin .  She was transferred out of the ICU on 5/15.  She was noted to have intermittent episodes of complete heart block as well, pacemaker was deferred until her infection was treated.    She was transferred to [facility] on 5/17 for additional care.  See prior notes, care everywhere for additional details.  She was transferred to [place] for cardiology consult and to be closer to home.  Cardiology was consulted due to the NSTEMI/bradycardia (concern for intermittent heart block).  Urology was consulted due to her sepsis with obstructing stone and hydro.  Cards suspicious for type 2 MI in setting for her acute illness.  Planning for aspirin  alone due to potential need for urologic procedure soon.  Planning outpatient stress test.  Regarding her hydro with obstructing stone, she had repeat CT here with mild obstructive changes.  IR c/s for nephrostomy tube, but declined due to the absence of substantial hydro.  Planning outpatient urologic procedure on June 4.  Currently stable for discharge.  See below for additional details.   Hospital Course:  Assessment and Plan:  NSTEMI Suspicion is for type II MI per cards (can't r/o significant CAD) Troponin peaked >60,000  5/16 troponin 39,631  5/18 14,726, 15,142 Echo 5/14 with EF 55%, grade II diastolic dysfunction, anteroapical hypokinesis (see care everywhere report) Cardiology recommending aspirin  alone (given potential need for intervention).  They're arranging for outpatient stress test.  Continue aspirin  and crestor .   Limited echo with low normal EF, regional wall motion abnormalities, moderate LVH, grade 1 diastolic dysfunction   Sinus Bradycardia Intermittent Complete Heart Block Tele with junctional brady overnight Per OSH patient had intermittent episodes  of complete heart block Plan was to reassess need for pacemaker after infection treated, hold any AV nodal blockers Cardiology consult, appreciate assistance - continue  to monitor - no CHB noted here.  No need for monitor at discharge per my discussion with cards.    Sepsis due to E. Coli Bacteremia due to UTI with obstructing stone  Blood cultures from 5/14 notable for e. Coli, cefazolin  sensitive Urine culture with e. Coli  CT chest abd/pelvis at OSH with moderate L hydro - 1.6 cm stone alone L renal pelvis  CT renal protocol here with mild obstructive changes due to 15 mm stones In the setting of her e. Coli bacteremia/bacteremia/obstructing stone, she needs source control.   IR declined perc nephrostomy tube based on resolution of sepsis and mild hydro.  Planning for outpatient definitive stone management, June 4th perc nephrolithotomy.  Will continue abx until this date.  Appreciate urology assistance   AKI on CKD IV Unclear baseline creatinine, in 2024, creatinine was 2.35 Creatinine 3.2 5/16 Creatinine today is 1.49 moderate hydro on CT abd/pelvis from OSH CT renal stone protocol here shows L renal calculi with mild obstructive changes due to 15 mm stone in renal pelvis Appreciate urology assistance with obstructing stone - planning for definitive stone management with urology June 4th   Acute Hypoxic Respiratory Failure COPD Exacerbation Community Acquired Pneumonia CT on 5/14 with left basilar consolidation concerning for pneumonia CT PE protocol with "masslike consolidation within the LLL along with enlarged mediastinal and bilateral hilar LN's" - needs follow up to resolution to ensure no mass lesion.  Repeat CT scan outpatient with PCP within Del[place] Ordway month.    CXR 5/18 with LLL pneumonia vs mass -> needs follow up to resolution CT renal stone study with chronic consolidation in LLL  She was treated for COPD exacerbation at presentation No si/sx exacerbation now.  Will refill albuterol  and breztri .  Wean O2 as able    HFpEF with Exacerbation Was diuresed at the OSH for concern for volume overload She appears euvolemic at this time Echo with  preserved EF, grade 2 diastolic dysfunction, anteroapical hypokinesis (see care everywhere) Limited echo here with EF 50-55%, moderate LVH, grade 1 diastolic dysfunction Strict I/O, daily weights Continue prn lasix  at home   Hx Stroke with Chronic R Sided Weakness Noted   Hypertension Hold amlodipine , benicar for now  Resume lasix  prn   T2DM Takes 22 units long acting in morning, 10 units at night and SSI for short acting Resume home regimen    Mood Disorder celexa    Apparently had tick on her side which was removed in the hospital, low suspicion that this is contributing to her current presentation -> NSTEMI and sepsis due e. Coli bacteremia in the setting of an obstructing stone.  Western blot was negative.  She received 3 doses of doxy, should be adequate for ppx.        Procedures: Echo IMPRESSIONS     1. Left ventricular ejection fraction, by estimation, is 50 to 55%. The  left ventricle has low normal function. The left ventricle demonstrates  regional wall motion abnormalities (see scoring diagram/findings for  description). There is moderate left  ventricular hypertrophy. Left ventricular diastolic parameters are  consistent with Grade I diastolic dysfunction (impaired relaxation).   2. The aortic valve is calcified. There is moderate calcification of the  aortic valve. Aortic valve regurgitation is not visualized.   3. The inferior vena cava is normal in size with greater than  50%  respiratory variability, suggesting right atrial pressure of 3 mmHg.   Consultations: Cards urology  Discharge Exam: Vitals:   12/14/23 0906 12/14/23 1210  BP:  110/63  Pulse: 60   Resp: 18 18  Temp:  97.6 F (36.4 C)  SpO2: 93%    No complaints Eager for discharge, husband agreeable  General: No acute distress. Cardiovascular: RRR, brady Lungs: unlabored Abdomen: Soft, nontender, nondistended  Neurological: chronic R sided weakness Extremities: No clubbing or  cyanosis. No edema.   Discharge Instructions   Discharge Instructions     Call MD for:  difficulty breathing, headache or visual disturbances   Complete by: As directed    Call MD for:  extreme fatigue   Complete by: As directed    Call MD for:  hives   Complete by: As directed    Call MD for:  persistant dizziness or light-headedness   Complete by: As directed    Call MD for:  persistant nausea and vomiting   Complete by: As directed    Call MD for:  redness, tenderness, or signs of infection (pain, swelling, redness, odor or green/yellow discharge around incision site)   Complete by: As directed    Call MD for:  severe uncontrolled pain   Complete by: As directed    Call MD for:  temperature >100.4   Complete by: As directed    Diet - low sodium heart healthy   Complete by: As directed    Discharge instructions   Complete by: As directed    You were transferred from [place]  to [facility] for abnormal heart enzymes (heart attack), sepsis, COPD exacerbation, and Kelly Gibson slow heart rate.  We had cardiology see you here for your NSTEMI (heart attack).  We've treated you medically with heparin  and aspirin  and Kelly Gibson statin.  Continue aspirin  and crestor  at home.  Cardiology will follow up with you as an outpatient.  They're planning for an outpatient stress test before your surgery in [place] Brouse couple of weeks.  Cardiology also saw you for Kelly Gibson slow heart rate.  We did not see any concerning low heart rate while you were here.  You had sepsis (Kelly Gibson severe infection with systemic effects).  You had bacteria in your blood as Felcia Huebert result of Kelly Gibson urine infection and Kelly Gibson kidney stone.  You'll need definitive management of the kidney stone.  You'll need to follow up with urology as an outpatient.  They've scheduled you for surgery on June 4th.  We'll continue antibiotics until then.  If you have fevers, chills, abdominal pain, or other concerning symptoms you'll need to come back to the hospital.  You need Breland Trouten repeat CT  scan as an outpatient to rule out Tanishka Drolet mass after treatment of your pneumonia.  Follow this with your PCP in about 1 month after you've completed your antibiotics.   Hold your blood pressure medicines for now.  Follow up with your outpatient doctor regarding when to resume your amlodipine  and benicar.  You can use your lasix  as needed for fluid or weight gain.    You should have repeat labs in [place]au week or so to ensure your kidney function is stable.  You can resume your diabetes medicines at home.  Watch your blood sugars carefully over the next few days.    Return for new, recurrent, or worsening symptoms.  Please ask your PCP to request records from this hospitalization so they know what was done and what the next steps will be.  Increase activity slowly   Complete by: As directed    No wound care   Complete by: As directed       Allergies as of 12/14/2023       Reactions   Latex Itching, Rash   Ciprofloxacin Other (See Comments)   Niacin Nausea And Vomiting   Niacin And Related Rash        Medication List     PAUSE taking these medications    amLODipine  5 MG tablet Wait to take this until your doctor or other care provider tells you to start again. Hold this until you get Arah Aro follow up appointment with your outpatient doctor to recheck your blood pressure outpatient Commonly known as: NORVASC  Take 1 tablet (5 mg total) by mouth daily.   olmesartan 40 MG tablet Wait to take this until your doctor or other care provider tells you to start again. Follow your blood pressure with your outpatient doctor.  Don't resume this unless your outpatient doctor recommends your resume based on your blood pressure and kidney function.  Commonly known as: BENICAR Take 40 mg by mouth in the morning.   Tradjenta  5 MG Tabs tablet Wait to take this until your doctor or other care provider tells you to start again. Discuss your issues with cost with your PCP outpatient   Generic drug:  linagliptin  Take 5 mg by mouth daily.       STOP taking these medications    Incruse Ellipta  62.5 MCG/ACT Aepb Generic drug: umeclidinium bromide        TAKE these medications    albuterol  108 (90 Base) MCG/ACT inhaler Commonly known as: VENTOLIN  HFA Inhale 2 puffs into the lungs every 6 (six) hours as needed for wheezing or shortness of breath.   aspirin  EC 81 MG tablet Take 1 tablet (81 mg total) by mouth daily. Swallow whole. Start taking on: Dec 15, 2023 What changed:  medication strength how much to take additional instructions   BLOOD GLUCOSE TEST STRIPS Strp Use as directed to monitor blood sugar up to 4x daily. Dx: E11.9.   Breztri  Aerosphere 160-9-4.8 MCG/ACT Aero inhaler Generic drug: budesonide -glycopyrrolate -formoterol  Inhale 2 puffs into the lungs 2 (two) times daily.   cefadroxil  500 MG capsule Commonly known as: DURICEF Take 2 capsules (1,000 mg total) by mouth 2 (two) times daily for 15 days.   citalopram  10 MG tablet Commonly known as: CELEXA  TAKE 1 TABLET BY MOUTH DAILY What changed: when to take this   clotrimazole -betamethasone  cream Commonly known as: Lotrisone  Apply 1 application topically 2 (two) times daily.   Dexcom G7 Sensor Misc USE ONE SENSORE EVERY 10 DAYS   Droplet Pen Needles 32G X 4 MM Misc Generic drug: Insulin  Pen Needle 5/day for 90 days   furosemide  20 MG tablet Commonly known as: LASIX  Take 1 tablet (20 mg total) by mouth daily as needed.   HumaLOG  KwikPen 100 UNIT/ML KwikPen Generic drug: insulin  lispro INJECT 3 TO 5 UNITS INTO THE SKIN DAILY BEFORE LUNCH What changed: See the new instructions.   Lancets 28G Misc 1 Units by Does not apply route 4 (four) times daily as needed.   Melatonin 5 MG Chew Chew 10 mg by mouth at bedtime.   Multivitamin Gummies Womens Chew Chew 2 each by mouth in the morning.   mupirocin  ointment 2 % Commonly known as: BACTROBAN  Apply topically daily. For lesion on forehead. What  changed:  how much to take when to take this reasons to take this  rosuvastatin  40 MG tablet Commonly known as: CRESTOR  TAKE 1 TABLET BY MOUTH DAILY What changed:  how much to take how to take this when to take this additional instructions   tiZANidine  2 MG tablet Commonly known as: ZANAFLEX  TAKE 1-2 TABLETS UP TO TWICE Javian Nudd DAY  AS NEEDED FOR MUSCLE SPASMS   Tresiba  FlexTouch 100 UNIT/ML FlexTouch Pen Generic drug: insulin  degludec Inject 10 Units into the skin 2 (two) times daily. Adjust by 2 units every 3 days as needed for blood sugars higher than 150 in the morning fasting. What changed:  how much to take additional instructions   UNABLE TO FIND Take 1 tablet by mouth with breakfast, with lunch, and with evening meal. GlucoGold   UNISOM PO Take 2 each by mouth at bedtime. Gel capsules       Allergies  Allergen Reactions   Latex Itching and Rash   Ciprofloxacin Other (See Comments)   Niacin Nausea And Vomiting   Niacin And Related Rash    Follow-up Information     Early, Adriane Albe, NP Follow up.   Specialty: Nurse Practitioner Why: call for follow up with your PCP within Queenie Aufiero week of this hospitalization Contact information: [address] [place] [address] [phone]         Andrez Banker, MD Follow up.   Specialty: Urology Why: You will have Habib Kise urology procedure on June 4th.  They'll call you to give you the details.  If you don't hear from urology, please call. Contact information: [address] [place] [address] [phone]         Arnoldo Lapping, MD Follow up.   Specialty: Cardiology Why: cardiology is arranging Kelly Gibson stress test for you before your urology procedure.  they'll be in touch to arrange outpatient follow up.  If you don't hear from them, please call for an appointment. Contact information: [address] [place] [address] [phone]         [facility] [place], [place]  Follow up.   Why: Physical and Occupational Therapy-office to call with visit times. Contact information: [address] [place] [address] [phone]                  The results of significant diagnostics from this hospitalization (including imaging, microbiology, ancillary and laboratory) are listed below for reference.    Significant Diagnostic Studies: ECHOCARDIOGRAM LIMITED Result Date: 12/12/2023    ECHOCARDIOGRAM LIMITED REPORT   Patient Name:   Kelly Gibson Date of Exam: 12/12/2023 Medical Rec #:  [id number]         Height:       63.0 in Accession #:    [id number]        Weight:       194.9 lb Date of Birth:  [birth year]        BSA:          1.912 m Patient Age:    62 years          BP:           157/64 mmHg Patient Gender: F                 HR:           49 bpm. Exam Location:  Inpatient Procedure: 2D Echo, Limited Echo, Cardiac Doppler and Color Doppler (Both            Spectral and Color Flow Doppler  were utilized during procedure). Indications:    NSTEMI  History:        Patient has prior history of Echocardiogram examinations, most                 recent 10/27/2017.  Sonographer:    Janette Medley Referring Phys: [id number] KAMAL H HENDERSON IMPRESSIONS  1. Left ventricular ejection fraction, by estimation, is 50 to 55%. The left ventricle has low normal function. The left ventricle demonstrates regional wall motion abnormalities (see scoring diagram/findings for description). There is moderate left ventricular hypertrophy. Left ventricular diastolic parameters are consistent with Grade I diastolic dysfunction (impaired relaxation).  2. The aortic valve is calcified. There is moderate calcification of the aortic valve. Aortic valve regurgitation is not visualized.  3. The inferior vena cava is normal in size with greater than 50% respiratory variability, suggesting right atrial pressure of 3 mmHg. FINDINGS  Left Ventricle: Left ventricular ejection fraction, by  estimation, is 50 to 55%. The left ventricle has low normal function. The left ventricle demonstrates regional wall motion abnormalities. There is moderate left ventricular hypertrophy. Left ventricular diastolic parameters are consistent with Grade I diastolic dysfunction (impaired relaxation).  LV Wall Scoring: The inferior wall and mid inferolateral segment are akinetic. Left Atrium: Left atrial size was normal in size. Aortic Valve: The aortic valve is calcified. There is moderate calcification of the aortic valve. Aortic valve regurgitation is not visualized. Aorta: The aortic root and ascending aorta are structurally normal, with no evidence of dilitation. Venous: The inferior vena cava is normal in size with greater than 50% respiratory variability, suggesting right atrial pressure of 3 mmHg. LEFT VENTRICLE PLAX 2D LVIDd:         4.20 cm   Diastology LVIDs:         2.70 cm   LV e' medial:   6.96 cm/s LV PW:         1.20 cm   LV E/e' medial: 13.9 LV IVS:        1.40 cm LVOT diam:     2.00 cm LVOT Area:     3.14 cm  RIGHT VENTRICLE            IVC RV S prime:     9.46 cm/s  IVC diam: 1.20 cm TAPSE (M-mode): 2.0 cm LEFT ATRIUM         Index LA diam:    3.50 cm 1.83 cm/m   AORTA Ao Root diam: 3.00 cm Ao Asc diam:  3.20 cm MITRAL VALVE MV Area (PHT): 2.26 cm     SHUNTS MV Decel Time: 335 msec     Systemic Diam: 2.00 cm MV E velocity: 96.40 cm/s MV Cristen Bredeson velocity: 113.00 cm/s MV E/Xaiver Roskelley ratio:  0.85 Dorothye Gathers MD Electronically signed by Dorothye Gathers MD Signature Date/Time: 12/12/2023/11:47:27 AM    Final    DG Chest 2 View Result Date: 12/12/2023 CLINICAL DATA:  63 year old female code sepsis.  Cough.  NSTEMI. EXAM: CHEST - 2 VIEW COMPARISON:  Portable chest yesterday and earlier. CT Abdomen and Pelvis this morning. FINDINGS: Confluent left lower lobe, retrocardiac opacity seen on CT this morning. Lung volumes and mediastinal contours stable from yesterday. Linear thickening or scarring along the right minor fissure  is stable. No pneumothorax or pulmonary edema. Stable lung volumes and mediastinal contours. Decreased bowel gas in the upper abdomen. Stable visualized osseous structures. IMPRESSION: 1. Left lower lobe Pneumonia vs.mass, visible on CT Abdomen and Pelvis this morning. No pleural effusion.  Follow-up will be necessary to exclude Tumor. 2. Linear scarring or pleural thickening along the right minor fissure. 3. No other cardiopulmonary abnormality. Electronically Signed   By: Marlise Simpers M.D.   On: 12/12/2023 09:52   CT RENAL STONE STUDY Result Date: 12/12/2023 CLINICAL DATA:  History of nephrolithiasis with flank pain, initial encounter EXAM: CT ABDOMEN AND PELVIS WITHOUT CONTRAST TECHNIQUE: Multidetector CT imaging of the abdomen and pelvis was performed following the standard protocol without IV contrast. RADIATION DOSE REDUCTION: This exam was performed according to the departmental dose-optimization program which includes automated exposure control, adjustment of the mA and/or kV according to patient size and/or use of iterative reconstruction technique. COMPARISON:  Ultrasound from 10/10/2021 FINDINGS: Lower chest: Chronic area of consolidation in the left lower lobe. Hepatobiliary: Cholelithiasis is noted. The liver is within normal limits. Pancreas: Unremarkable. No pancreatic ductal dilatation or surrounding inflammatory changes. Spleen: Normal in size without focal abnormality. Adrenals/Urinary Tract: Adrenal glands are within normal limits. Kidneys are well visualized bilaterally. Multiple renal calculi are noted on the left measuring up to 14 mm in the left renal pelvis. Mild fullness of the collecting system is noted secondary to the renal pelvic stone. The more distal left ureter is within normal limits. The bladder is within normal limits. Stomach/Bowel: Scattered fecal material is noted throughout the colon. No obstructive or inflammatory changes are noted. The appendix is within normal limits. No  obstructive or inflammatory changes of the small bowel are seen. Stomach is within normal limits. Vascular/Lymphatic: Aortic atherosclerosis. No enlarged abdominal or pelvic lymph nodes. Reproductive: Uterus and bilateral adnexa are unremarkable. Other: No abdominal wall hernia or abnormality. No abdominopelvic ascites. Musculoskeletal: No acute or significant osseous findings. IMPRESSION: Left renal calculi with mild obstructive changes secondary to the 15 mm stone in the renal pelvis. No distal obstructive changes are seen. Cholelithiasis without complicating factors. Chronic consolidation in the left lower lobe. Electronically Signed   By: Violeta Grey M.D.   On: 12/12/2023 01:54   DG CHEST PORT 1 VIEW Result Date: 12/11/2023 CLINICAL DATA:  10026 Shortness of breath 10026 EXAM: PORTABLE CHEST 1 VIEW COMPARISON:  May 10, 2023 FINDINGS: The cardiomediastinal silhouette is unchanged and enlarged in contour.There is fullness of the RIGHT paratracheal stripe no pleural effusion. No pneumothorax. Band like opacities in the bilateral perihilar regions, most likely atelectasis. IMPRESSION: There is fullness of the RIGHT paratracheal stripe. This could reflect adenopathy versus summation artifact. Recommend repeat PA and lateral chest radiograph for improved evaluation when clinically appropriate. Electronically Signed   By: Clancy Crimes M.D.   On: 12/11/2023 17:35    Microbiology: Recent Results (from the past 240 hours)  Culture, blood (Routine X 2) w Reflex to ID Panel     Status: None (Preliminary result)   Collection Time: 12/11/23  6:03 PM   Specimen: BLOOD  Result Value Ref Range Status   Specimen Description BLOOD BLOOD RIGHT HAND  Final   Special Requests   Final    BOTTLES DRAWN AEROBIC AND ANAEROBIC Blood Culture results may not be optimal due to an inadequate volume of blood received in culture bottles   Culture   Final    NO GROWTH 3 DAYS Performed at [facility], [address], [place], [place] [address]    Report Status PENDING  Incomplete  Culture, blood (Routine X 2) w Reflex to ID Panel     Status: None (Preliminary result)   Collection Time: 12/11/23  6:16 PM  Specimen: BLOOD LEFT HAND  Result Value Ref Range Status   Specimen Description BLOOD LEFT HAND  Final   Special Requests   Final    AEROBIC BOTTLE ONLY Blood Culture results may not be optimal due to an inadequate volume of blood received in culture bottles   Culture   Final    NO GROWTH 3 DAYS Performed at [facility] [facility], [address], [place], [place] [address]    Report Status PENDING  Incomplete  MRSA Next Gen by PCR, Nasal     Status: None   Collection Time: 12/13/23  8:53 AM   Specimen: Nasal Mucosa; Nasal Swab  Result Value Ref Range Status   MRSA by PCR Next Gen NOT DETECTED NOT DETECTED Final    Comment: (NOTE) The GeneXpert MRSA Assay (FDA approved for NASAL specimens only), is one component of Kelly Gibson comprehensive MRSA colonization surveillance program. It is not intended to diagnose MRSA infection nor to guide or monitor treatment for MRSA infections. Test performance is not FDA approved in patients less than 22 years old. Performed at [facility], [address] [place], [place] [address]      Labs: Basic Metabolic Panel: Recent Labs  Lab 12/11/23 1830 12/12/23 0438 12/13/23 0613 12/14/23 0617  NA 139 139 140 141  K 4.6 3.7 3.7 3.7  CL 95* 96* 100 97*  CO2 26 30 27 28   GLUCOSE 276* 140* 169* 162*  BUN 56* 54* 43* 43*  CREATININE 2.31* 1.98* 1.45* 1.49*  CALCIUM  9.9 9.8 9.8 10.3  MG  --   --  2.0 2.2  PHOS  --   --  4.1 3.9   Liver Function Tests: Recent Labs  Lab 12/11/23 1830 12/13/23 0613 12/14/23 0617  AST 58* 25 25  ALT 35 15 12  ALKPHOS 79 64 62  BILITOT 0.5 0.5 0.7  PROT 8.3* 7.3 7.7  ALBUMIN 3.5 3.1* 3.2*   No results for input(s): "LIPASE", "AMYLASE" in the last 168 hours. No results for input(s): "AMMONIA" in the last 168  hours. CBC: Recent Labs  Lab 12/11/23 1830 12/12/23 0438 12/13/23 0613 12/14/23 0617  WBC 12.0* 13.2* 9.2 11.7*  NEUTROABS 10.8*  --   --   --   HGB 13.7 12.8 14.1 15.3*  HCT 42.3 39.8 43.7 47.4*  MCV 85.1 85.6 85.2 84.9  PLT 266 285 284 327   Cardiac Enzymes: No results for input(s): "CKTOTAL", "CKMB", "CKMBINDEX", "TROPONINI" in the last 168 hours. BNP: BNP (last 3 results) No results for input(s): "BNP" in the last 8760 hours.  ProBNP (last 3 results) No results for input(s): "PROBNP" in the last 8760 hours.  CBG: Recent Labs  Lab 12/13/23 2052 12/13/23 2132 12/14/23 0738 12/14/23 1206 12/14/23 1614  GLUCAP 162* 149* 186* 203* 195*       Signed:  Donnetta Gains MD.  [facility] 12/14/2023, 5:14 PM

## 2023-12-14 NOTE — Progress Notes (Signed)
 Reviewed AVS, patient expressed understanding of medications, MD follow up reviewed.   Removed IV, Site clean, dry and intact.  Patient states all belongings brought to the hospital at time of admission are accounted for and packed to take home.  Picked up medications from [facility] pharmacy.

## 2023-12-14 NOTE — Progress Notes (Addendum)
 Subjective: Met with pt and her husband. She was in good spirits and being placed back in bed. Reviewed case and plan. All questions were answered to their satisfaction.   Objective: Vital signs in last 24 hours: Temp:  [97.4 F (36.3 C)-98.1 F (36.7 C)] 97.6 F (36.4 C) (05/20 0740) Pulse Rate:  [54-94] 94 (05/20 0740) Resp:  [16-17] 16 (05/20 0740) BP: (99-157)/(54-86) 99/73 (05/20 0740) SpO2:  [94 %-100 %] 97 % (05/20 0740)  Assessment/Plan: #sepsis- resolved #left staghorn stone  Left staghorn stone with associated hydroneprosis.   Recovering from sepsis. IR consulted for left percutaneous nephrostomy tube and declined based on the resolution of her sepsis and mild hydro. I feel she will decline at home and would still prefer the percutaneous nephrostomy tube be placed during this admission while on holiday from anticoagulation.   Addendum: Discussed with IR. They felt the case would be technically challenging in the absence if substantial hydronephrosis and risk vs benefit favors monitoring and staying on suppressive ABX until which time PCNL can be completed. At that time they will provide access. The surgeon and OR availability will place this case longer in the future than we can keep her on ABX, but will do our best to accommodate their plan.   Intake/Output from previous day: 05/19 0701 - 05/20 0700 In: 120 [P.O.:120] Out: 450 [Urine:450]  Intake/Output this shift: No intake/output data recorded.  Physical Exam:  General: Alert and oriented CV: No cyanosis Lungs: equal chest rise   Lab Results: Recent Labs    12/12/23 0438 12/13/23 0613 12/14/23 0617  HGB 12.8 14.1 15.3*  HCT 39.8 43.7 47.4*   BMET Recent Labs    12/13/23 0613 12/14/23 0617  NA 140 141  K 3.7 3.7  CL 100 97*  CO2 27 28  GLUCOSE 169* 162*  BUN 43* 43*  CREATININE 1.45* 1.49*  CALCIUM  9.8 10.3  HGB 14.1 15.3*  WBC 9.2 11.7*     Studies/Results: ECHOCARDIOGRAM  LIMITED Result Date: 12/12/2023    ECHOCARDIOGRAM LIMITED REPORT   Patient Name:   ALAIRA LEVEL Date of Exam: 12/12/2023 Medical Rec #:  [id number]         Height:       63.0 in Accession #:    [id number]        Weight:       194.9 lb Date of Birth:  [birth year]        BSA:          1.912 m Patient Age:    63 years          BP:           157/64 mmHg Patient Gender: F                 HR:           49 bpm. Exam Location:  Inpatient Procedure: 2D Echo, Limited Echo, Cardiac Doppler and Color Doppler (Both            Spectral and Color Flow Doppler were utilized during procedure). Indications:    NSTEMI  History:        Patient has prior history of Echocardiogram examinations, most                 recent 10/27/2017.  Sonographer:    Janette Medley Referring Phys: [id number] KAMAL H HENDERSON IMPRESSIONS  1. Left ventricular ejection fraction, by estimation, is 50 to 55%. The left ventricle  has low normal function. The left ventricle demonstrates regional wall motion abnormalities (see scoring diagram/findings for description). There is moderate left ventricular hypertrophy. Left ventricular diastolic parameters are consistent with Grade I diastolic dysfunction (impaired relaxation).  2. The aortic valve is calcified. There is moderate calcification of the aortic valve. Aortic valve regurgitation is not visualized.  3. The inferior vena cava is normal in size with greater than 50% respiratory variability, suggesting right atrial pressure of 3 mmHg. FINDINGS  Left Ventricle: Left ventricular ejection fraction, by estimation, is 50 to 55%. The left ventricle has low normal function. The left ventricle demonstrates regional wall motion abnormalities. There is moderate left ventricular hypertrophy. Left ventricular diastolic parameters are consistent with Grade I diastolic dysfunction (impaired relaxation).  LV Wall Scoring: The inferior wall and mid inferolateral segment are akinetic. Left Atrium: Left atrial size was normal  in size. Aortic Valve: The aortic valve is calcified. There is moderate calcification of the aortic valve. Aortic valve regurgitation is not visualized. Aorta: The aortic root and ascending aorta are structurally normal, with no evidence of dilitation. Venous: The inferior vena cava is normal in size with greater than 50% respiratory variability, suggesting right atrial pressure of 3 mmHg. LEFT VENTRICLE PLAX 2D LVIDd:         4.20 cm   Diastology LVIDs:         2.70 cm   LV e' medial:   6.96 cm/s LV PW:         1.20 cm   LV E/e' medial: 13.9 LV IVS:        1.40 cm LVOT diam:     2.00 cm LVOT Area:     3.14 cm  RIGHT VENTRICLE            IVC RV S prime:     9.46 cm/s  IVC diam: 1.20 cm TAPSE (M-mode): 2.0 cm LEFT ATRIUM         Index LA diam:    3.50 cm 1.83 cm/m   AORTA Ao Root diam: 3.00 cm Ao Asc diam:  3.20 cm MITRAL VALVE MV Area (PHT): 2.26 cm     SHUNTS MV Decel Time: 335 msec     Systemic Diam: 2.00 cm MV E velocity: 96.40 cm/s MV A velocity: 113.00 cm/s MV E/A ratio:  0.85 Dorothye Gathers MD Electronically signed by Dorothye Gathers MD Signature Date/Time: 12/12/2023/11:47:27 AM    Final       LOS: 3 days   Alla Ar, NP [facility] Pager: [phone]  12/14/2023, 8:11 AM

## 2023-12-14 NOTE — Care Management Important Message (Signed)
 Important Message  Patient Details  Name: Kelly Gibson MRN: [id number] Date of Birth: May 17, 1961   Important Message Given:  Yes - Medicare IM     Felix Host 12/14/2023, 5:04 PM

## 2023-12-14 NOTE — TOC Initial Note (Signed)
 Transition of Care [facility]) - Initial/Assessment Note    Patient Details  Name: Kelly Gibson MRN: [id number] Date of Birth: [birth year]  Transition of Care [facility] CM/SW Contact:    Kelly Diones, RN Phone Number: 12/14/2023, 4:51 PM  Clinical Narrative:  Patient presented for chest pain-NStemi. PTA patient was from home with spouse. Patient has DME: cane, rolling walker and stair lift chair. Plan for home today with home health services. Case Manager discussed with patient and she nor spouse has an agency preference. Referral submitted to [facility] and start of care to begin within 24-48 hours post transition home. Spouse to transport home via private vehicle. No further needs identified at this time.                  Expected Discharge Plan: Home w Home Health Services Barriers to Discharge: No Barriers Identified   Patient Goals and CMS Choice Patient states their goals for this hospitalization and ongoing recovery are:: to return home with sposue.   Choice offered to / list presented to : Patient (Patient did not have a preference.)      Expected Discharge Plan and Services In-house Referral: NA Discharge Planning Services: CM Consult   Living arrangements for the past 2 months: Single Family Home Expected Discharge Date: 12/14/23                         HH Arranged: PT, OT HH Agency: [facility] Date [facility] Agency Contacted: 12/14/23 Time HH Agency Contacted: 1650 Representative spoke with at [facility] Agency: Kelly Gibson  Prior Living Arrangements/Services Living arrangements for the past 2 months: Single Family Home Lives with:: Spouse Patient language and need for interpreter reviewed:: Yes Do you feel safe going back to the place where you live?: Yes      Need for Family Participation in Patient Care: Yes (Comment) Care giver support system in place?: Yes (comment) Current home services: DME (cane, rolling walker and stair lift  chair.) Criminal Activity/Legal Involvement Pertinent to Current Situation/Hospitalization: No - Comment as needed  Activities of Daily Living   ADL Screening (condition at time of admission) Independently performs ADLs?: No Does the patient have a NEW difficulty with bathing/dressing/toileting/self-feeding that is expected to last >3 days?: No Does the patient have a NEW difficulty with getting in/out of bed, walking, or climbing stairs that is expected to last >3 days?: No Does the patient have a NEW difficulty with communication that is expected to last >3 days?: No Is the patient deaf or have difficulty hearing?: No Does the patient have difficulty seeing, even when wearing glasses/contacts?: No Does the patient have difficulty concentrating, remembering, or making decisions?: No  Permission Sought/Granted Permission sought to share information with : Family Supports, Magazine features editor, Case Estate manager/land agent granted to share information with : Yes, Verbal Permission Granted     Permission granted to share info w AGENCY: [facility]        Emotional Assessment Appearance:: Appears stated age Attitude/Demeanor/Rapport: Engaged Affect (typically observed): Appropriate Orientation: : Oriented to Self, Oriented to Place, Oriented to  Time, Oriented to Situation Alcohol / Substance Use: Not Applicable Psych Involvement: No (comment)  Admission diagnosis:  NSTEMI (non-ST elevation myocardial infarction) [facility]) [I21.4] Patient Active Problem List   Diagnosis Date Noted   Coronary artery calcification 12/12/2023   Preop cardiovascular exam 12/12/2023   Sick sinus syndrome (HCC) 12/12/2023   Pneumonia of left lower lobe due to  infectious organism 12/12/2023   Lactic acidosis 12/12/2023   Non-ST elevation (NSTEMI) myocardial infarction (HCC) 12/11/2023   Heart block AV complete (HCC) 12/11/2023   Bacteremia 12/11/2023   Major depression in remission (HCC)  04/25/2023   Hyperlipidemia associated with type 2 diabetes mellitus (HCC) 08/29/2021   Insomnia 11/04/2020   Hemiparesis affecting right side as late effect of cerebrovascular accident (CVA) (HCC) 07/04/2020   Chronic kidney disease (CKD), stage III (moderate) (HCC) 05/16/2019   Dysphagia, post-stroke    Cerebrovascular disease 10/18/2017   COPD (chronic obstructive pulmonary disease) (HCC) 02/18/2015   Hypertension associated with diabetes (HCC) 12/17/2011   Type 2 diabetes mellitus with diabetic neuropathy, with long-term current use of insulin  (HCC) 12/14/2011   PCP:  Kelly Kief, NP Pharmacy:   [facility] [id number] - [place], [place] - [address] [place] [address] Phone: [phone] Fax: [phone]  [facility] [address] [place] [address] Phone: [phone] Fax: [phone]     Social Drivers of Health (SDOH) Social History: SDOH Screenings   Food Insecurity: No Food Insecurity (12/11/2023)  Housing: Low Risk  (12/12/2023)  Transportation Needs: No Transportation Needs (12/11/2023)  Utilities: Not At Risk (12/11/2023)  Alcohol Screen: Low Risk  (04/11/2019)  Depression (PHQ2-9): Low Risk  (08/29/2021)  Financial Resource Strain: Medium Risk (04/08/2023)  Physical Activity: Insufficiently Active (04/08/2023)  Social Connections: Socially Integrated (04/08/2023)  Stress: No Stress Concern Present (04/08/2023)  Tobacco Use: Medium Risk (12/13/2023)   SDOH Interventions:     Readmission Risk Interventions     No data to display

## 2023-12-15 ENCOUNTER — Telehealth: Payer: Self-pay | Admitting: *Deleted

## 2023-12-15 ENCOUNTER — Other Ambulatory Visit: Payer: Self-pay | Admitting: Urology

## 2023-12-15 ENCOUNTER — Other Ambulatory Visit: Payer: Self-pay | Admitting: Internal Medicine

## 2023-12-15 ENCOUNTER — Telehealth: Payer: Self-pay

## 2023-12-15 DIAGNOSIS — R079 Chest pain, unspecified: Secondary | ICD-10-CM

## 2023-12-15 DIAGNOSIS — I214 Non-ST elevation (NSTEMI) myocardial infarction: Secondary | ICD-10-CM

## 2023-12-15 NOTE — Transitions of Care (Post Inpatient/ED Visit) (Signed)
   12/15/2023  Name: Kelly Gibson MRN: [id number] DOB: 1960-08-15  Today's TOC FU Call Status: Today's TOC FU Call Status:: Unsuccessful Call (1st Attempt) Unsuccessful Call (1st Attempt) Date: 12/15/23  Attempted to reach the patient regarding the most recent Inpatient/ED visit.  Follow Up Plan: Additional outreach attempts will be made to reach the patient to complete the Transitions of Care (Post Inpatient/ED visit) call.    [name] MSN, RN RN Case Sales executive Health  [facility] Office Hours M-F [phone] Direct Dial: [phone] Main Phone [phone]  Fax: [phone] [email or web address]

## 2023-12-15 NOTE — Telephone Encounter (Signed)
 Spoke with pt's husband, Gwen Lek per [employer]. Gave instructions for MPI study.

## 2023-12-16 ENCOUNTER — Telehealth: Payer: Self-pay | Admitting: *Deleted

## 2023-12-16 ENCOUNTER — Ambulatory Visit (HOSPITAL_COMMUNITY)
Admission: RE | Admit: 2023-12-16 | Discharge: 2023-12-16 | Disposition: A | Discharge status: Home / Self Care | Source: Ambulatory Visit | Attending: Internal Medicine | Admitting: Internal Medicine

## 2023-12-16 ENCOUNTER — Telehealth: Payer: Self-pay

## 2023-12-16 ENCOUNTER — Ambulatory Visit: Payer: Self-pay | Admitting: Internal Medicine

## 2023-12-16 DIAGNOSIS — G8191 Hemiplegia, unspecified affecting right dominant side: Secondary | ICD-10-CM | POA: Diagnosis not present

## 2023-12-16 DIAGNOSIS — I700 Atherosclerosis of aorta: Secondary | ICD-10-CM | POA: Diagnosis not present

## 2023-12-16 DIAGNOSIS — I214 Non-ST elevation (NSTEMI) myocardial infarction: Secondary | ICD-10-CM | POA: Insufficient documentation

## 2023-12-16 DIAGNOSIS — I2510 Atherosclerotic heart disease of native coronary artery without angina pectoris: Secondary | ICD-10-CM | POA: Diagnosis not present

## 2023-12-16 DIAGNOSIS — N390 Urinary tract infection, site not specified: Secondary | ICD-10-CM | POA: Diagnosis not present

## 2023-12-16 DIAGNOSIS — R079 Chest pain, unspecified: Secondary | ICD-10-CM | POA: Insufficient documentation

## 2023-12-16 DIAGNOSIS — J440 Chronic obstructive pulmonary disease with acute lower respiratory infection: Secondary | ICD-10-CM | POA: Diagnosis not present

## 2023-12-16 LAB — CULTURE, BLOOD (ROUTINE X 2)
Culture: NO GROWTH
Culture: NO GROWTH

## 2023-12-16 LAB — MYOCARDIAL PERFUSION IMAGING
LV dias vol: 108 mL (ref 46–106)
LV sys vol: 60 mL
Nuc Stress EF: 44 %
Peak HR: 70 {beats}/min
Rest HR: 53 {beats}/min
Rest Nuclear Isotope Dose: 10.7 mCi
SDS: 9
SRS: 15
SSS: 24
ST Depression (mm): 0 mm
Stress Nuclear Isotope Dose: 32.2 mCi
TID: 1.19

## 2023-12-16 MED ORDER — TECHNETIUM TC 99M TETROFOSMIN IV KIT
32.2000 | PACK | Freq: Once | INTRAVENOUS | Status: AC | PRN
  Administered 2023-12-16: 32.2 via INTRAVENOUS

## 2023-12-16 MED ORDER — TECHNETIUM TC 99M TETROFOSMIN IV KIT
10.7000 | PACK | Freq: Once | INTRAVENOUS | Status: AC | PRN
  Administered 2023-12-16: 10.7 via INTRAVENOUS

## 2023-12-16 MED ORDER — REGADENOSON 0.4 MG/5ML IV SOLN
0.4000 mg | Freq: Once | INTRAVENOUS | Status: AC
  Administered 2023-12-16: 0.4 mg via INTRAVENOUS

## 2023-12-16 MED ORDER — REGADENOSON 0.4 MG/5ML IV SOLN
INTRAVENOUS | Status: AC
  Filled 2023-12-16: qty 5

## 2023-12-16 NOTE — Telephone Encounter (Signed)
 Copied from CRM [id number]. Topic: Clinical - Home Health Verbal Orders >> Dec 16, 2023 12:38 PM Rosamond Comes wrote: Caller/Agency: Monique Physical therapist with [facility] Callback Number: [phone] Service Requested: Physical Therapy Frequency: 1 time for 1 week      2 time for 3 week    1 time for 3  Any new concerns about the patient? Yes Need occupational therapy evaluation and treatment order   Is this okay?

## 2023-12-16 NOTE — Telephone Encounter (Signed)
 Is this okay or should this be deferred to specialist?

## 2023-12-16 NOTE — Transitions of Care (Post Inpatient/ED Visit) (Signed)
   12/16/2023  Name: LIZMARY NADER MRN: [id number] DOB: 1960-09-03  Today's TOC FU Call Status: Today's TOC FU Call Status:: Unsuccessful Call (2nd Attempt) Unsuccessful Call (2nd Attempt) Date: 12/16/23  Attempted to reach the patient regarding the most recent Inpatient/ED visit.  Follow Up Plan: Additional outreach attempts will be made to reach the patient to complete the Transitions of Care (Post Inpatient/ED visit) call.    [name] MSN, RN RN Case Sales executive Health  [facility]Population [facility] Office Hours M-F [phone] Direct Dial: [phone] Main Phone [phone]  Fax: [phone] [email or web address]

## 2023-12-17 ENCOUNTER — Telehealth: Payer: Self-pay

## 2023-12-17 ENCOUNTER — Other Ambulatory Visit: Payer: Self-pay | Admitting: Urology

## 2023-12-17 NOTE — Telephone Encounter (Signed)
 Caller/Agency: Sharri Dee Physical therapist with [facility] is calling for verbal for orders Callback Number: [phone]

## 2023-12-17 NOTE — Progress Notes (Signed)
Got it, will do!

## 2023-12-17 NOTE — Transitions of Care (Post Inpatient/ED Visit) (Signed)
   12/17/2023  Name: Kelly Gibson MRN: [id number] DOB: 22-Sep-1960  Today's TOC FU Call Status: Today's TOC FU Call Status:: Unsuccessful Call (3rd Attempt) Unsuccessful Call (3rd Attempt) Date: 12/17/23  Attempted to reach the patient regarding the most recent Inpatient/ED visit.  Follow Up Plan: No further outreach attempts will be made at this time. We have been unable to contact the patient.   [name] MSN, RN RN Case Sales executive Health  [facility]Population [facility] Office Hours M-F [phone] Direct Dial: [phone] Main Phone [phone]  Fax: [phone] [email or web address]

## 2023-12-17 NOTE — Telephone Encounter (Signed)
 Ok given to [employer] for PT

## 2023-12-20 ENCOUNTER — Ambulatory Visit (HOSPITAL_BASED_OUTPATIENT_CLINIC_OR_DEPARTMENT_OTHER): Payer: Self-pay | Admitting: Internal Medicine

## 2023-12-21 NOTE — Patient Instructions (Signed)
 SURGICAL WAITING ROOM VISITATION  Patients having surgery or a procedure may have no more than 2 support people in the waiting area - these visitors may rotate.    Children under the age of 71 must have an adult with them who is not the patient.  Due to an increase in RSV and influenza rates and associated hospitalizations, children ages 6 and under may not visit patients in [facility] hospitals.  Visitors with respiratory illnesses are discouraged from visiting and should remain at home.  If the patient needs to stay at the hospital during part of their recovery, the visitor guidelines for inpatient rooms apply. Pre-op nurse will coordinate an appropriate time for 1 support person to accompany patient in pre-op.  This support person may not rotate.    Please refer to the [facility] website for the visitor guidelines for Inpatients (after your surgery is over and you are in a regular room).       Your procedure is scheduled on:    Report to [facility] Main Entrance    Report to admitting at AM   Call this number if you have problems the morning of surgery [phone]   Do not eat food :After Midnight.   After Midnight you may have the following liquids until ______ AM/ PM DAY OF SURGERY  Water Non-Citrus Juices (without pulp, NO RED-Apple, White grape, White cranberry) Black Coffee (NO MILK/CREAM OR CREAMERS, sugar ok)  Clear Tea (NO MILK/CREAM OR CREAMERS, sugar ok) regular and decaf                             Plain Jell-O (NO RED)                                           Fruit ices (not with fruit pulp, NO RED)                                     Popsicles (NO RED)                                                               Sports drinks like Gatorade (NO RED)              Drink 2 Ensure/G2 drinks AT 10:00 PM the night before surgery.        The day of surgery:  Drink ONE (1) Pre-Surgery Clear Ensure or G2 at AM the morning of surgery. Drink in one  sitting. Do not sip.  This drink was given to you during your hospital  pre-op appointment visit. Nothing else to drink after completing the  Pre-Surgery Clear Ensure or G2.          If you have questions, please contact your surgeon's office.   FOLLOW BOWEL PREP AND ANY ADDITIONAL PRE OP INSTRUCTIONS YOU RECEIVED FROM YOUR SURGEON'S OFFICE!!!     Oral Hygiene is also important to reduce your risk of infection.  Remember - BRUSH YOUR TEETH THE MORNING OF SURGERY WITH YOUR REGULAR TOOTHPASTE  DENTURES WILL BE REMOVED PRIOR TO SURGERY PLEASE DO NOT APPLY "Poly grip" OR ADHESIVES!!!   Do NOT smoke after Midnight   Stop all vitamins and herbal supplements 7 days before surgery.   Take these medicines the morning of surgery with A SIP OF WATER: citalopram , cefadroxil , inhaler and bring rescue inhaler with you   DO NOT TAKE ANY ORAL DIABETIC MEDICATIONS DAY OF YOUR SURGERY  [place]  day BEFORE surgery  take 50% of normal dinner/bedtime dose  [place] day OF surgery take 50% of normal morning dose  Humalog   Day BEFORE surgery before meals as usual no bedtime dose Humalog   Day OF surgery none unless Blood sugar is greater than 220   take 1/2 of usual dose  How to Manage Your Diabetes Before and After Surgery  Why is it important to control my blood sugar before and after surgery? Improving blood sugar levels before and after surgery helps healing and can limit problems. A way of improving blood sugar control is eating a healthy diet by:  Eating less sugar and carbohydrates  Increasing activity/exercise  Talking with your doctor about reaching your blood sugar goals High blood sugars (greater than 180 mg/dL) can raise your risk of infections and slow your recovery, so you will need to focus on controlling your diabetes during the weeks before surgery. Make sure that the doctor who takes care of your diabetes knows about your planned surgery including  the date and location.  How do I manage my blood sugar before surgery? Check your blood sugar at least 4 times a day, starting 2 days before surgery, to make sure that the level is not too high or low. Check your blood sugar the morning of your surgery when you wake up and every 2 hours until you get to the Short Stay unit. If your blood sugar is less than 70 mg/dL, you will need to treat for low blood sugar: Do not take insulin . Treat a low blood sugar (less than 70 mg/dL) with  cup of clear juice (cranberry or apple), 4 glucose tablets, OR glucose gel. Recheck blood sugar in 15 minutes after treatment (to make sure it is greater than 70 mg/dL). If your blood sugar is not greater than 70 mg/dL on recheck, call [phone] for further instructions. Report your blood sugar to the short stay nurse when you get to Short Stay.  If you are admitted to the hospital after surgery: Your blood sugar will be checked by the staff and you will probably be given insulin  after surgery (instead of oral diabetes medicines) to make sure you have good blood sugar levels. The goal for blood sugar control after surgery is 80-180 mg/dL.   WHAT DO I DO ABOUT MY DIABETES MEDICATION?  Do not take oral diabetes medicines (pills) the morning of surgery.                                 You may not have any metal on your body including hair pins, jewelry, and body piercing             Do not wear make-up, lotions, powders, perfumes/cologne, or deodorant  Do not wear nail polish including gel and S&S, artificial/acrylic nails, or any other type of covering on natural nails including finger and toenails. If you have artificial nails, gel coating, etc.  that needs to be removed by a nail salon please have this removed prior to surgery or surgery may need to be canceled/ delayed if the surgeon/ anesthesia feels like they are unable to be safely monitored.   Do not shave  48 hours prior to surgery.          Do not  bring valuables to the hospital. [facility] IS NOT             RESPONSIBLE   FOR VALUABLES.   Contacts, glasses, dentures or bridgework may not be worn into surgery.   Bring small overnight bag day of surgery.   DO NOT BRING YOUR HOME MEDICATIONS TO THE HOSPITAL. PHARMACY WILL DISPENSE MEDICATIONS LISTED ON YOUR MEDICATION LIST TO YOU DURING YOUR ADMISSION IN THE HOSPITAL!    Patients discharged on the day of surgery will not be allowed to drive home.  Someone NEEDS to stay with you for the first 24 hours after anesthesia.   Special Instructions: Bring a copy of your healthcare power of attorney and living will documents the day of surgery if you haven't scanned them before.              Please read over the following fact sheets you were given: IF YOU HAVE QUESTIONS ABOUT YOUR PRE-OP INSTRUCTIONS PLEASE CALL [phone]   . If you test positive for Covid or have been in contact with anyone that has tested positive in the last 10 days please notify you surgeon.    [place] - Preparing for Surgery Before surgery, you can play an important role.  Because skin is not sterile, your skin needs to be as free of germs as possible.  You can reduce the number of germs on your skin by washing with CHG (chlorahexidine gluconate) soap before surgery.  CHG is an antiseptic cleaner which kills germs and bonds with the skin to continue killing germs even after washing. Please DO NOT use if you have an allergy to CHG or antibacterial soaps.  If your skin becomes reddened/irritated stop using the CHG and inform your nurse when you arrive at Short Stay. Do not shave (including legs and underarms) for at least 48 hours prior to the first CHG shower.  You may shave your face/neck. Please follow these instructions carefully:  1.  Shower with CHG Soap the night before surgery and the  morning of Surgery.  2.  If you choose to wash your hair, wash your hair first as usual with your  normal  shampoo.  3.   After you shampoo, rinse your hair and body thoroughly to remove the  shampoo.                           4.  Use CHG as you would any other liquid soap.  You can apply chg directly  to the skin and wash                       Gently with a scrungie or clean washcloth.  5.  Apply the CHG Soap to your body ONLY FROM THE NECK DOWN.   Do not use on face/ open                           Wound or open sores. Avoid contact with eyes, ears mouth and genitals (private parts).  Wash face,  Genitals (private parts) with your normal soap.             6.  Wash thoroughly, paying special attention to the area where your surgery  will be performed.  7.  Thoroughly rinse your body with warm water from the neck down.  8.  DO NOT shower/wash with your normal soap after using and rinsing off  the CHG Soap.                9.  Pat yourself dry with a clean towel.            10.  Wear clean pajamas.            11.  Place clean sheets on your bed the night of your first shower and do not  sleep with pets. Day of Surgery : Do not apply any lotions/deodorants the morning of surgery.  Please wear clean clothes to the hospital/surgery center.  FAILURE TO FOLLOW THESE INSTRUCTIONS MAY RESULT IN THE CANCELLATION OF YOUR SURGERY PATIENT SIGNATURE_________________________________  NURSE SIGNATURE__________________________________  ________________________________________________________________________

## 2023-12-21 NOTE — Progress Notes (Signed)
 PCP - Archibald Beard, NP LOV 11-16-22  Cardiologist - Dr. Maximo Spar, MD  Alben Alma, Hao-PA 2019   PPM/ICD -  Device Orders -  Rep Notified -   Chest x-ray - 12-12-23 epic EKG - 12-11-23 epic Stress Test - 12-16-23 epic ECHO - 12-12-23 epic Cardiac Cath -   Sleep Study -  CPAP -   Fasting Blood Sugar -  Checks Blood Sugar _____ times a day  Blood Thinner Instructions: Aspirin  Instructions:81 mg asa   ERAS Protcol -N/A PRE-SURGERY     COVID vaccine -yes  Activity--WC and a hemi walker , SOB at rest No CP  Anesthesia review: See stress test results, CAD,HTN, Stroke 2019 , COPD, DM2 Right side hemi paresis CKD3,, NSTEMI, heart block ,SSS, Hospitalized 12-11-23-12-14-23.  Unable to void at preop and SOB at rest [facility] PA-C aware . Pt. Advised to call urology and go to ED if symptoms persist. I also LVM and sent message via epic secure chat to Madelyne Schiff at [place].  Patient denies shortness of breath, fever, cough and chest pain at PAT appointment   All instructions explained to the patient, with a verbal understanding of the material. Patient agrees to go over the instructions while at home for a better understanding. Patient also instructed to self quarantine after being tested for COVID-19. The opportunity to ask questions was provided.

## 2023-12-23 ENCOUNTER — Other Ambulatory Visit: Payer: Self-pay

## 2023-12-23 ENCOUNTER — Encounter (HOSPITAL_COMMUNITY)
Admission: RE | Admit: 2023-12-23 | Discharge: 2023-12-23 | Disposition: A | Discharge status: Home / Self Care | Source: Ambulatory Visit | Attending: Urology | Admitting: Urology

## 2023-12-23 ENCOUNTER — Encounter (HOSPITAL_COMMUNITY): Payer: Self-pay

## 2023-12-23 VITALS — BP 153/79 | HR 87 | Temp 98.7°F | Resp 20 | Ht 63.0 in | Wt 192.0 lb

## 2023-12-23 DIAGNOSIS — Z01812 Encounter for preprocedural laboratory examination: Secondary | ICD-10-CM | POA: Insufficient documentation

## 2023-12-23 DIAGNOSIS — E1140 Type 2 diabetes mellitus with diabetic neuropathy, unspecified: Secondary | ICD-10-CM | POA: Diagnosis not present

## 2023-12-23 DIAGNOSIS — Z794 Long term (current) use of insulin: Secondary | ICD-10-CM | POA: Insufficient documentation

## 2023-12-23 DIAGNOSIS — Z01818 Encounter for other preprocedural examination: Secondary | ICD-10-CM

## 2023-12-23 HISTORY — DX: Chronic kidney disease, unspecified: N18.9

## 2023-12-23 HISTORY — DX: Pneumonia, unspecified organism: J18.9

## 2023-12-23 HISTORY — DX: Personal history of urinary calculi: Z87.442

## 2023-12-23 HISTORY — DX: Acute myocardial infarction, unspecified: I21.9

## 2023-12-23 HISTORY — DX: Cardiac arrhythmia, unspecified: I49.9

## 2023-12-23 LAB — SURGICAL PCR SCREEN
MRSA, PCR: NEGATIVE
Staphylococcus aureus: NEGATIVE

## 2023-12-23 LAB — GLUCOSE, CAPILLARY: Glucose-Capillary: 104 mg/dL — ABNORMAL HIGH (ref 70–99)

## 2023-12-23 NOTE — Progress Notes (Addendum)
 Unable to void at preop . LVM on Dr. Dulcy Gibney Scheduler VM.Pt. has not been able to void today and only voided once yesterday.

## 2023-12-24 ENCOUNTER — Ambulatory Visit: Attending: Physician Assistant | Admitting: Physician Assistant

## 2023-12-24 ENCOUNTER — Encounter: Payer: Self-pay | Admitting: Physician Assistant

## 2023-12-24 VITALS — BP 154/54 | HR 57 | Ht 63.0 in | Wt 193.0 lb

## 2023-12-24 DIAGNOSIS — R9439 Abnormal result of other cardiovascular function study: Secondary | ICD-10-CM

## 2023-12-24 DIAGNOSIS — I214 Non-ST elevation (NSTEMI) myocardial infarction: Secondary | ICD-10-CM

## 2023-12-24 DIAGNOSIS — E785 Hyperlipidemia, unspecified: Secondary | ICD-10-CM

## 2023-12-24 DIAGNOSIS — Z8673 Personal history of transient ischemic attack (TIA), and cerebral infarction without residual deficits: Secondary | ICD-10-CM

## 2023-12-24 DIAGNOSIS — I4430 Unspecified atrioventricular block: Secondary | ICD-10-CM | POA: Diagnosis not present

## 2023-12-24 DIAGNOSIS — N200 Calculus of kidney: Secondary | ICD-10-CM | POA: Diagnosis not present

## 2023-12-24 DIAGNOSIS — I10 Essential (primary) hypertension: Secondary | ICD-10-CM | POA: Diagnosis not present

## 2023-12-24 DIAGNOSIS — R079 Chest pain, unspecified: Secondary | ICD-10-CM

## 2023-12-24 DIAGNOSIS — J42 Unspecified chronic bronchitis: Secondary | ICD-10-CM

## 2023-12-24 NOTE — Progress Notes (Addendum)
 Cardiology Office Note   Date:  12/24/2023  ID:  Kelly Gibson, Kelly Gibson 1961-05-05, MRN [id number] PCP: Annella Kief, NP  [facility] Providers Cardiologist:  Hazle Lites, MD     History of Present Illness Kelly Gibson is a 63 y.o. female  with past medical history of hypertension, hyperlipidemia, DM II, COPD, depression and CVA.  Patient had a subacute CVA in [facility] in February 2019.  She was admitted to [facility] on 10/26/2017 again with possible recurrence of CVA.  Initial MRI of the brain was negative for acute CVA.  Patient was found to have severely elevated blood pressure with systolic blood pressure in the 190s.  She was also noted to be bradycardic in the hospital with heart rate into the 40s.  She was not on any AV nodal blocking agent.  Echocardiogram showed EF 60 to 65%, moderate LVH, trace to mild MR, grade 2 DD with elevated LVEDP.  Outpatient 30-day event monitor was recommended to look for symptomatic bradycardia.  Patient was readmitted in May 2019 with CVA.  MRI showed small acute lacunar infarct along with left frontal horn ventricular white matter.  Subacute lacunar infarction in the left posterior corona radiata.  Numerous chronic lacunar infarct in the deep gray-white matter.  She was placed on 325 mg daily aspirin  for CVA prophylaxis.  Telemetry during this admission showed a sinus rhythm, nocturnal sinus pauses, occasional daytime pauses.  Patient was seen by Dr. Nunzio Belch, it was recommended to avoid AV nodal blocking agent, otherwise no obvious sign of symptomatic bradycardia to suggest pacemaker.  It was recommended also to consider outpatient sleep study as well once improved from stroke.  Heart monitor showed nocturnal sinus pauses up to 3.4 seconds.  Patient was completely asymptomatic.  He has been lost to follow-up from 2019 all the way until recently May 2025.    While in [place]  visiting, she suddenly noticed shortness of breath with  mild cough.  She also has some chest pain with deep breath and cough.  Due to shortness of breath not improving, she went to [facility] in [place]  for further assessment.  Initial troponin was elevated at 282.  CT of the chest was ruled out for PE but had left basilar consolidation concerning for pneumonia.  She was treated for acute hypoxic respiratory failure initially requiring BiPAP therapy.  He she was unresponsive to nebulizer treatment for COPD.  She reports that her shortness of breath eventually got better with medication.  Echocardiogram performed on 12/08/2023 demonstrated EF 55%.  Patient eventually developed septic shock with bacteremia with E. coli that thought to be secondary to urosepsis for which she was placed on IV antibiotic.  Subsequent high-sensitivity troponin trended up to greater than 60,000 and the patient was managed as NSTEMI.  She was placed on aspirin  and Plavix  and IV heparin .  Given sepsis, it was opted not to pursue urgent coronary angiography.  Heart rate was in the 40s to 50s during the hospitalization with occasional sinus pause.  Per cardiology consult note over there, she may had intermittent complete heart block.  She did not wish to stay in [place]  and requested to be transferred to [facility] to further care.  ECG in the hospital showed likely anterior infarct.  Echocardiogram obtained on 12/12/2023 showed EF 50 to 55%, low normal EF, moderate LVH, grade 1 DD, inferior wall and mid inferolateral segment were akinetic.  Troponin was up to 15,000.  She  also had a left partial staghorn calculus with mild hydronephrosis, urology service was consulted who recommended left nephrostomy tube placement.  Plavix  was held in anticipation of urology procedure, she was consider a high risk candidate for low risk procedure.  She was seen by Dr. Arlester Ladd during the hospitalization, considering her baseline functional status with significant physical impairment from her stroke  as well as multiple medical issues, he did not think the patient was a good candidate for invasive evaluation with cardiac catheterization.  Once she recovered from her acute illness, outpatient stress test can be considered.  Outpatient Myoview  was eventually completed on 12/16/2023, this showed a high risk study, there was no evidence of infarction but there was a large defect of severe reduction in uptake present in the apical to basal anterior, anterolateral and anteroseptal, lateral and apex location that is consistent with ischemia in the LAD territory.  Patient presents today to cardiology office visit along with husband.  She denies any recent chest discomfort.  She has chronic residual right upper extremity and right lower extremity weakness.  I reviewed recent hospitalization record with the patient and her husband.  I also discussed her case with DOD Dr. Swaziland, she is quite complicated, if we were to proceed with urology surgery first, she would be a very high risk candidate for the intended procedure given large area of ischemia in the anterior wall.  Alternatively, if we were to proceed with cardiac catheterization first and PCI, we may have to place the patient on dual antiplatelet therapy afterward and that that may interfere with her urology procedure.  Also her EKG today showed evidence of advanced AV dissociation.  Dr. Swaziland recommended proceed with diagnostic cardiac catheterization prior to her urology surgery.  I placed the patient on Dr. Filiberto Hug scheduled for next Tuesday since her urology surgery is next Wednesday.  I reviewed and discussed her case with Dr. Filiberto Hug as well who is willing to do the procedure.  Depending on the result of cardiac catheterization, Dr. Filiberto Hug may let her proceed with urology procedure or try to delay the surgery.  I will see the patient back in 2 weeks, potentially will consider a heart monitor at that time given evidence of advanced A-V dissociation  on today's EKG.  I have sent a staff message to Dr. Dulcy Gibney of urology service and Dr. Filiberto Hug to make sure they are aware of the current plan.  ROS:   She denies chest pain, palpitations, dyspnea, pnd, orthopnea, n, v, dizziness, syncope, edema, weight gain, or early satiety. All other systems reviewed and are otherwise negative except as noted above.   She has chronic right-sided weakness involving both right upper and the right lower extremity.  Studies Reviewed      Cardiac Studies & Procedures   ______________________________________________________________________________________________   STRESS TESTS  MYOCARDIAL PERFUSION IMAGING 12/16/2023  Narrative   Findings are consistent with ischemia. The study is high risk.   No ST deviation was noted. The ECG was not diagnostic due to pharmacologic protocol.   LV perfusion is abnormal. There is evidence of ischemia. There is no evidence of infarction. Defect 1: There is a large defect with severe reduction in uptake present in the apical to basal anterior, anterolateral, anteroseptal, lateral and apex location(s) that is reversible. Viability is present. There is abnormal wall motion in the defect area. Consistent with ischemia.   Left ventricular function is abnormal. Global function is moderately reduced. Nuclear stress EF: 44%. The left ventricular ejection fraction  is moderately decreased (30-44%). End diastolic cavity size is mildly enlarged. End systolic cavity size is normal.   CT images were obtained for attenuation correction and were examined for the presence of coronary calcium  when appropriate.   Coronary calcium  was present on the attenuation correction CT images. Severe coronary calcifications were present. Coronary calcifications were present in the left anterior descending artery distribution(s) and to a lesser extent in the LCX and RCA. Aortic atherosclerosis was also noted.   Prior study not available for comparison.    ECG rhythm shows normal sinus rhythm.  Large size, severe severity reversible anterior, anteroseptal, anterolateral, lateral and apical perfusion defect, suggestive of LAD territory/diagonal ischemia (SDS 9). LVEF 44% with severe hypokinesis corresponding to the areas of perfusion defect. CT imaging demonstrates multivessel coronary calcium  with severe LAD and diagonal branch calcification. Overall this is a high risk study given the size of the perfusion defect. No prior study available for comparison. Definitive cardiac catheterization is recommended.   ECHOCARDIOGRAM  ECHOCARDIOGRAM LIMITED 12/12/2023  Narrative ECHOCARDIOGRAM LIMITED REPORT    Patient Name:   Kelly Gibson Date of Exam: 12/12/2023 Medical Rec #:  [id number]         Height:       63.0 in Accession #:    [id number]        Weight:       194.9 lb Date of Birth:  May 04, 1961        BSA:          1.912 m Patient Age:    62 years          BP:           157/64 mmHg Patient Gender: F                 HR:           49 bpm. Exam Location:  Inpatient  Procedure: 2D Echo, Limited Echo, Cardiac Doppler and Color Doppler (Both Spectral and Color Flow Doppler were utilized during procedure).  Indications:    NSTEMI  History:        Patient has prior history of Echocardiogram examinations, most recent 10/27/2017.  Sonographer:    Janette Medley Referring Phys: [id number] KAMAL H HENDERSON  IMPRESSIONS   1. Left ventricular ejection fraction, by estimation, is 50 to 55%. The left ventricle has low normal function. The left ventricle demonstrates regional wall motion abnormalities (see scoring diagram/findings for description). There is moderate left ventricular hypertrophy. Left ventricular diastolic parameters are consistent with Grade I diastolic dysfunction (impaired relaxation). 2. The aortic valve is calcified. There is moderate calcification of the aortic valve. Aortic valve regurgitation is not visualized. 3. The inferior  vena cava is normal in size with greater than 50% respiratory variability, suggesting right atrial pressure of 3 mmHg.  FINDINGS Left Ventricle: Left ventricular ejection fraction, by estimation, is 50 to 55%. The left ventricle has low normal function. The left ventricle demonstrates regional wall motion abnormalities. There is moderate left ventricular hypertrophy. Left ventricular diastolic parameters are consistent with Grade I diastolic dysfunction (impaired relaxation).   LV Wall Scoring: The inferior wall and mid inferolateral segment are akinetic.  Left Atrium: Left atrial size was normal in size.  Aortic Valve: The aortic valve is calcified. There is moderate calcification of the aortic valve. Aortic valve regurgitation is not visualized.  Aorta: The aortic root and ascending aorta are structurally normal, with no evidence of dilitation.  Venous: The inferior vena cava is  normal in size with greater than 50% respiratory variability, suggesting right atrial pressure of 3 mmHg.  LEFT VENTRICLE PLAX 2D LVIDd:         4.20 cm   Diastology LVIDs:         2.70 cm   LV e' medial:   6.96 cm/s LV PW:         1.20 cm   LV E/e' medial: 13.9 LV IVS:        1.40 cm LVOT diam:     2.00 cm LVOT Area:     3.14 cm   RIGHT VENTRICLE            IVC RV S prime:     9.46 cm/s  IVC diam: 1.20 cm TAPSE (M-mode): 2.0 cm  LEFT ATRIUM         Index LA diam:    3.50 cm 1.83 cm/m  AORTA Ao Root diam: 3.00 cm Ao Asc diam:  3.20 cm  MITRAL VALVE MV Area (PHT): 2.26 cm     SHUNTS MV Decel Time: 335 msec     Systemic Diam: 2.00 cm MV E velocity: 96.40 cm/s MV A velocity: 113.00 cm/s MV E/A ratio:  0.85  Dorothye Gathers MD Electronically signed by Dorothye Gathers MD Signature Date/Time: 12/12/2023/11:47:27 AM    Final    MONITORS  CARDIAC EVENT MONITOR 12/01/2017       ______________________________________________________________________________________________      Risk  Assessment/Calculations           Physical Exam VS:  Ht 5\' 3"  (1.6 m)   Wt 193 lb (87.5 kg)   LMP 12/10/2012 (Exact Date)   BMI 34.19 kg/m    Wt Readings from Last 3 Encounters:  12/24/23 193 lb (87.5 kg)  12/23/23 192 lb (87.1 kg)  12/11/23 194 lb 14.2 oz (88.4 kg)    GEN: Well nourished, well developed in no acute distress NECK: No JVD; No carotid bruits CARDIAC: RRR, no murmurs, rubs, gallops RESPIRATORY:  Clear to auscultation without rales, wheezing or rhonchi  ABDOMEN: Soft, non-tender, non-distended EXTREMITIES:  No edema; No deformity   ASSESSMENT AND PLAN  Abnormal stress test: Recently was admitted with NSTEMI and urosepsis.  I discussed the case with Dr. Filiberto Hug and Dr. [place], patient has upcoming urological surgery, given the large area of ischemia in the anterior wall, she would be high risk for the intended surgery.  Dr. Swaziland recommended proceed with diagnostic cardiac catheterization, I discussed her case with Dr. Filiberto Hug who will be doing the procedure next Tuesday.  Risk and benefit of the surgery has been discussed with the patient who is agreeable to proceed.  NSTEMI: See #1  Hypertension: Blood pressure elevated today, 2 of her usual blood pressure medications currently on hold  Hyperlipidemia: On rosuvastatin   History of CVA: Chronic right upper and lower extremity weakness.  Gets around with a wheelchair, can only walk very short distance with walker at home  COPD: No acute exacerbation, although I suspect she has quite significant COPD at baseline, she has not required home oxygen  Kidney stone: Recent admitted for urosepsis.  Seen by urology service, pending upcoming urological surgery on Wednesday.  Advanced AV block: Seen on today's EKG.  She has high-grade conduction disease.  Will need to monitor heart rate closely during procedure or surgery.   Shared Decision Making/Informed Consent The risks [stroke (1 in 1000), death (1 in 1000),  kidney failure [usually temporary] (1 in 500), bleeding (1 in 200), allergic  reaction [possibly serious] (1 in 200)], benefits (diagnostic support and management of coronary artery disease) and alternatives of a cardiac catheterization were discussed in detail with Kelly Gibson and she is willing to proceed.        Dispo: Follow-up in 2 to 4 weeks.  Signed, Ervin Heath, PA

## 2023-12-24 NOTE — Plan of Care (Signed)
 I would not be able to do her kidney stone surgery while on aspirin  and plavix .  Our plan is to remove her stone through her back.  She would likely be able to restart the anticoagulation within a few days of the surgery.  The surgery is certainly not an emergency - we were able to treat her Urosepsis with antibiotics alone (no stent), and we could probably just put her on long term low dose suppression abx to prevent her from a similar event before we can get in and remove the stone.  I certainly don't want to make things worse for her and rush into surgery with a very high risk of a cardiac event.  As such, it seems best to postpone her case until her heart issues have been sorted out and it's safe to stop her anticoagulant for long enough to get things done.  I think its reasonable to delay her case for 6 months assuming we can keep her from recurrent UTIs.  I will schedule her for f/u in our clinic to discuss this further, but will take her off the surgery schedule for now.

## 2023-12-24 NOTE — Patient Instructions (Signed)
 Medication Instructions:  NO CHANGES *If you need a refill on your cardiac medications before your next appointment, please call your pharmacy*  Lab Work: NO LABS If you have labs (blood work) drawn today and your tests are completely normal, you will receive your results only by: MyChart Message (if you have MyChart) OR A paper copy in the mail If you have any lab test that is abnormal or we need to change your treatment, we will call you to review the results.  Testing/Procedures:       Cardiac/Peripheral Catheterization   You are scheduled for a Cardiac Catheterization on Tuesday, June 3 with Dr. Fransico Ivy.  1. Please arrive at the [facility] (Main Entrance A) at [facility]: [address] [place], [place] [address] at 5:30 AM (This time is 2 hour(s) before your procedure to ensure your preparation).   Free valet parking service is available. You will check in at ADMITTING. The support person will be asked to wait in the waiting room.  It is OK to have someone drop you off and come back when you are ready to be discharged.        Special note: Every effort is made to have your procedure done on time. Please understand that emergencies sometimes delay scheduled procedures.  2. Diet: Do not eat solid foods after midnight.  You may have clear liquids until 5 AM the day of the procedure.  3. Labs: Already drawn  4. Medication instructions in preparation for your procedure:   Contrast Allergy: No  HOLD INSULIN  THE MORNING OF PROCEDURE, TAKE 1/2 THE DOSE THE NIGHT BEFORE  HOLD LASIX  2 DAYS PRIOR TO PROCEDURE  HOLD OLMESARTAN MORNING OF PROCEDURE  On the morning of your procedure, take Aspirin  81 mg and any morning medicines NOT listed above.  You may use sips of water.  5. Plan to go home the same day, you will only stay overnight if medically necessary. 6. You MUST have a responsible adult to drive you home. 7. An adult MUST be with you the first 24 hours  after you arrive home. 8. Bring a current list of your medications, and the last time and date medication taken. 9. Bring ID and current insurance cards. 10.Please wear clothes that are easy to get on and off and wear slip-on shoes.  Thank you for allowing us  to care for you!   -- [facility]   Follow-Up: At [facility], you and your health needs are our priority.  As part of our continuing mission to provide you with exceptional heart care, our providers are all part of one team.  This team includes your primary Cardiologist (physician) and Advanced Practice Providers or APPs (Physician Assistants and Nurse Practitioners) who all work together to provide you with the care you need, when you need it.  Your next appointment:   2-3 week(s) after left heart cath  Provider:   Ervin Heath, PA

## 2023-12-25 ENCOUNTER — Encounter (HOSPITAL_COMMUNITY): Payer: Self-pay | Admitting: Physician Assistant

## 2023-12-25 ENCOUNTER — Other Ambulatory Visit: Payer: Self-pay | Admitting: Physician Assistant

## 2023-12-27 ENCOUNTER — Telehealth: Payer: Self-pay | Admitting: Cardiology

## 2023-12-27 ENCOUNTER — Telehealth: Payer: Self-pay | Admitting: *Deleted

## 2023-12-27 MED ORDER — SODIUM CHLORIDE 0.9 % IV SOLN
1.0000 g | Freq: Once | INTRAVENOUS | Status: DC
  Filled 2023-12-27 (×2): qty 1000

## 2023-12-27 MED ORDER — GENTAMICIN SULFATE 40 MG/ML IJ SOLN
5.0000 mg/kg | Freq: Once | INTRAVENOUS | Status: DC
  Filled 2023-12-27 (×2): qty 11

## 2023-12-27 NOTE — Telephone Encounter (Signed)
 Husband would like to know if patient still needs left heart cath since she is already scheduled for 6/04 with Dr. Dulcy Gibney.

## 2023-12-27 NOTE — Telephone Encounter (Signed)
 Called patient's husband back about message. Patient's husband stated that patient has Heart Cath scheduled tomorrow (Tuesday 12/28/23) and her Urology has her scheduled for surgery the next day (Wednesday 12/29/23). He wants to know if patient will still be able to do surgery the next day after her heart cath. Will send message to Providers that are involved in her care.

## 2023-12-27 NOTE — Telephone Encounter (Signed)
 I called and spoke to her husband, patient will undergo cardiac catheterization on Tuesday.  I have previously messaged Dr. Dulcy Gibney of urology service, from Dr. Rollie Clipper perspective, it appears upcoming urology procedure is not urgent.  He likely will delay the surgery depend on the result of the cardiac catheterization.

## 2023-12-27 NOTE — Telephone Encounter (Signed)
 Cardiac Catheterization scheduled at [facility] for: Tuesday December 28, 2023 10:30 AM Arrival time [facility] Main Entrance A at: 5:30 AM-pre-procedure hydration  Nothing to eat after midnight prior to procedure, clear liquids until 5 AM day of procedure.  Medication instructions: -Hold:  Insulin -AM of procedure/1/2 usual Insulin  dose HS prior to procedure  Tradjenta -AM of procdedure  Lasix /Olmesatan-pt's husband tells me these are both on hold -Other usual morning medications can be taken with sips of water including aspirin  81 mg.  Plan to go home the same day, you will only stay overnight if medically necessary.  You must have responsible adult to drive you home.  Someone must be with you the first 24 hours after you arrive home.  Reviewed procedure instructions /pre-procedure hydration with patient.   Patient's husband tells me he has talked with Dr Herrick's office, urology procedure scheduled 12/29/23 has been cancelled , patient has appt in office with Dr Dulcy Gibney 01/03/24.

## 2023-12-28 ENCOUNTER — Other Ambulatory Visit (HOSPITAL_COMMUNITY): Payer: Self-pay

## 2023-12-28 ENCOUNTER — Ambulatory Visit (HOSPITAL_COMMUNITY)
Admission: RE | Admit: 2023-12-28 | Discharge: 2023-12-28 | Disposition: A | Discharge status: Home / Self Care | Attending: Cardiology | Admitting: Cardiology

## 2023-12-28 ENCOUNTER — Other Ambulatory Visit: Payer: Self-pay

## 2023-12-28 ENCOUNTER — Encounter (HOSPITAL_COMMUNITY)
Admission: RE | Disposition: A | Discharge status: Home / Self Care | Payer: Self-pay | Source: Home / Self Care | Attending: Cardiology

## 2023-12-28 ENCOUNTER — Encounter (HOSPITAL_COMMUNITY): Payer: Self-pay | Admitting: Cardiology

## 2023-12-28 DIAGNOSIS — I2584 Coronary atherosclerosis due to calcified coronary lesion: Secondary | ICD-10-CM | POA: Insufficient documentation

## 2023-12-28 DIAGNOSIS — Z79899 Other long term (current) drug therapy: Secondary | ICD-10-CM | POA: Diagnosis not present

## 2023-12-28 DIAGNOSIS — E1122 Type 2 diabetes mellitus with diabetic chronic kidney disease: Secondary | ICD-10-CM | POA: Diagnosis not present

## 2023-12-28 DIAGNOSIS — I10 Essential (primary) hypertension: Secondary | ICD-10-CM

## 2023-12-28 DIAGNOSIS — Z7982 Long term (current) use of aspirin: Secondary | ICD-10-CM | POA: Diagnosis not present

## 2023-12-28 DIAGNOSIS — I25118 Atherosclerotic heart disease of native coronary artery with other forms of angina pectoris: Secondary | ICD-10-CM | POA: Insufficient documentation

## 2023-12-28 DIAGNOSIS — I2582 Chronic total occlusion of coronary artery: Secondary | ICD-10-CM | POA: Diagnosis not present

## 2023-12-28 DIAGNOSIS — N189 Chronic kidney disease, unspecified: Secondary | ICD-10-CM | POA: Diagnosis not present

## 2023-12-28 DIAGNOSIS — I509 Heart failure, unspecified: Secondary | ICD-10-CM | POA: Diagnosis not present

## 2023-12-28 DIAGNOSIS — I69322 Dysarthria following cerebral infarction: Secondary | ICD-10-CM | POA: Diagnosis not present

## 2023-12-28 DIAGNOSIS — N200 Calculus of kidney: Secondary | ICD-10-CM | POA: Insufficient documentation

## 2023-12-28 DIAGNOSIS — R9439 Abnormal result of other cardiovascular function study: Secondary | ICD-10-CM | POA: Diagnosis present

## 2023-12-28 DIAGNOSIS — J449 Chronic obstructive pulmonary disease, unspecified: Secondary | ICD-10-CM | POA: Diagnosis not present

## 2023-12-28 DIAGNOSIS — I130 Hypertensive heart and chronic kidney disease with heart failure and stage 1 through stage 4 chronic kidney disease, or unspecified chronic kidney disease: Secondary | ICD-10-CM | POA: Diagnosis not present

## 2023-12-28 DIAGNOSIS — E785 Hyperlipidemia, unspecified: Secondary | ICD-10-CM | POA: Diagnosis not present

## 2023-12-28 DIAGNOSIS — R1310 Dysphagia, unspecified: Secondary | ICD-10-CM | POA: Diagnosis not present

## 2023-12-28 DIAGNOSIS — R001 Bradycardia, unspecified: Secondary | ICD-10-CM | POA: Diagnosis not present

## 2023-12-28 DIAGNOSIS — I442 Atrioventricular block, complete: Secondary | ICD-10-CM | POA: Insufficient documentation

## 2023-12-28 DIAGNOSIS — F32A Depression, unspecified: Secondary | ICD-10-CM | POA: Insufficient documentation

## 2023-12-28 DIAGNOSIS — I69351 Hemiplegia and hemiparesis following cerebral infarction affecting right dominant side: Secondary | ICD-10-CM | POA: Diagnosis not present

## 2023-12-28 DIAGNOSIS — Z7902 Long term (current) use of antithrombotics/antiplatelets: Secondary | ICD-10-CM | POA: Diagnosis not present

## 2023-12-28 DIAGNOSIS — I252 Old myocardial infarction: Secondary | ICD-10-CM | POA: Diagnosis not present

## 2023-12-28 HISTORY — PX: LEFT HEART CATH AND CORONARY ANGIOGRAPHY: CATH118249

## 2023-12-28 LAB — GLUCOSE, CAPILLARY
Glucose-Capillary: 148 mg/dL — ABNORMAL HIGH (ref 70–99)
Glucose-Capillary: 88 mg/dL (ref 70–99)

## 2023-12-28 SURGERY — LEFT HEART CATH AND CORONARY ANGIOGRAPHY
Anesthesia: LOCAL

## 2023-12-28 MED ORDER — FENTANYL CITRATE (PF) 100 MCG/2ML IJ SOLN
INTRAMUSCULAR | Status: DC | PRN
  Administered 2023-12-28: 25 ug via INTRAVENOUS

## 2023-12-28 MED ORDER — FENTANYL CITRATE (PF) 100 MCG/2ML IJ SOLN
INTRAMUSCULAR | Status: AC
  Filled 2023-12-28: qty 2

## 2023-12-28 MED ORDER — HYDRALAZINE HCL 20 MG/ML IJ SOLN
10.0000 mg | INTRAMUSCULAR | Status: DC | PRN
  Administered 2023-12-28: 10 mg via INTRAVENOUS
  Filled 2023-12-28: qty 1

## 2023-12-28 MED ORDER — SODIUM CHLORIDE 0.9 % IV SOLN
250.0000 mL | INTRAVENOUS | Status: DC | PRN
Start: 2023-12-28 — End: 2023-12-28

## 2023-12-28 MED ORDER — ASPIRIN 81 MG PO CHEW: 81.0000 mg | CHEWABLE_TABLET | ORAL | Status: DC

## 2023-12-28 MED ORDER — HYDRALAZINE HCL 100 MG PO TABS
100.0000 mg | ORAL_TABLET | Freq: Two times a day (BID) | ORAL | 1 refills | Status: DC
  Filled 2023-12-28: qty 60, 30d supply, fill #0

## 2023-12-28 MED ORDER — SODIUM CHLORIDE 0.9 % WEIGHT BASED INFUSION: 1.0000 mL/kg/h | INTRAVENOUS | Status: DC

## 2023-12-28 MED ORDER — IOHEXOL 350 MG/ML SOLN
INTRAVENOUS | Status: DC | PRN
  Administered 2023-12-28: 25 mL

## 2023-12-28 MED ORDER — LIDOCAINE HCL (PF) 1 % IJ SOLN
INTRAMUSCULAR | Status: DC | PRN
Start: 2023-12-28 — End: 2023-12-28
  Administered 2023-12-28: 15 mL via INTRADERMAL

## 2023-12-28 MED ORDER — SODIUM CHLORIDE 0.9 % IV SOLN
INTRAVENOUS | Status: AC
Start: 2023-12-28 — End: 2023-12-28

## 2023-12-28 MED ORDER — SODIUM CHLORIDE 0.9 % WEIGHT BASED INFUSION
3.0000 mL/kg/h | INTRAVENOUS | Status: AC
  Administered 2023-12-28: 3 mL/kg/h via INTRAVENOUS

## 2023-12-28 MED ORDER — SODIUM CHLORIDE 0.9% FLUSH: 3.0000 mL | INTRAVENOUS | Status: DC | PRN

## 2023-12-28 MED ORDER — HEPARIN (PORCINE) IN NACL 1000-0.9 UT/500ML-% IV SOLN
INTRAVENOUS | Status: DC | PRN
  Administered 2023-12-28 (×2): 500 mL

## 2023-12-28 MED ORDER — ACETAMINOPHEN 325 MG PO TABS: 650.0000 mg | ORAL_TABLET | ORAL | Status: DC | PRN

## 2023-12-28 MED ORDER — MIDAZOLAM HCL 2 MG/2ML IJ SOLN
INTRAMUSCULAR | Status: DC | PRN
  Administered 2023-12-28: 1 mg via INTRAVENOUS

## 2023-12-28 MED ORDER — MIDAZOLAM HCL 2 MG/2ML IJ SOLN
INTRAMUSCULAR | Status: AC
  Filled 2023-12-28: qty 2

## 2023-12-28 MED ORDER — ONDANSETRON HCL 4 MG/2ML IJ SOLN: 4.0000 mg | Freq: Four times a day (QID) | INTRAMUSCULAR | Status: DC | PRN

## 2023-12-28 MED ORDER — SODIUM CHLORIDE 0.9% FLUSH
3.0000 mL | Freq: Two times a day (BID) | INTRAVENOUS | Status: DC
Start: 2023-12-28 — End: 2023-12-28

## 2023-12-28 MED ORDER — LIDOCAINE HCL (PF) 1 % IJ SOLN
INTRAMUSCULAR | Status: AC
  Filled 2023-12-28: qty 30

## 2023-12-28 SURGICAL SUPPLY — 9 items
CATH INFINITI 5FR MULTPACK ANG (CATHETERS) IMPLANT
CLOSURE MYNX CONTROL 5F (Vascular Products) IMPLANT
KIT SYRINGE INJ CVI SPIKEX1 (MISCELLANEOUS) IMPLANT
PACK CARDIAC CATHETERIZATION (CUSTOM PROCEDURE TRAY) ×1 IMPLANT
SET ATX-X65L (MISCELLANEOUS) IMPLANT
SHEATH PINNACLE 5F 10CM (SHEATH) IMPLANT
SHEATH PROBE COVER 6X72 (BAG) IMPLANT
WIRE EMERALD 3MM-J .035X150CM (WIRE) IMPLANT
WIRE MICRO SET SILHO 5FR 7 (SHEATH) IMPLANT

## 2023-12-28 NOTE — H&P (Signed)
 OV 12/24/2023 copied for documentation   Cardiology Office Note   Date:  12/28/2023  ID:  Kelly Gibson, Kelly Gibson 06-27-1961, MRN [id number] PCP: Annella Kief, NP  [facility] Providers Cardiologist:  Hazle Lites, MD     History of Present Illness Kelly Gibson is a 63 y.o. female  with past medical history of hypertension, hyperlipidemia, DM II, COPD, depression and CVA.  Patient had a subacute CVA in [facility] in February 2019.  She was admitted to [facility] on 10/26/2017 again with possible recurrence of CVA.  Initial MRI of the brain was negative for acute CVA.  Patient was found to have severely elevated blood pressure with systolic blood pressure in the 190s.  She was also noted to be bradycardic in the hospital with heart rate into the 40s.  She was not on any AV nodal blocking agent.  Echocardiogram showed EF 60 to 65%, moderate LVH, trace to mild MR, grade 2 DD with elevated LVEDP.  Outpatient 30-day event monitor was recommended to look for symptomatic bradycardia.  Patient was readmitted in May 2019 with CVA.  MRI showed small acute lacunar infarct along with left frontal horn ventricular white matter.  Subacute lacunar infarction in the left posterior corona radiata.  Numerous chronic lacunar infarct in the deep gray-white matter.  She was placed on 325 mg daily aspirin  for CVA prophylaxis.  Telemetry during this admission showed a sinus rhythm, nocturnal sinus pauses, occasional daytime pauses.  Patient was seen by Dr. Nunzio Belch, it was recommended to avoid AV nodal blocking agent, otherwise no obvious sign of symptomatic bradycardia to suggest pacemaker.  It was recommended also to consider outpatient sleep study as well once improved from stroke.  Heart monitor showed nocturnal sinus pauses up to 3.4 seconds.  Patient was completely asymptomatic.  He has been lost to follow-up from 2019 all the way until recently May 2025.    While in [place]  visiting, she  suddenly noticed shortness of breath with mild cough.  She also has some chest pain with deep breath and cough.  Due to shortness of breath not improving, she went to [facility] in [place]  for further assessment.  Initial troponin was elevated at 282.  CT of the chest was ruled out for PE but had left basilar consolidation concerning for pneumonia.  She was treated for acute hypoxic respiratory failure initially requiring BiPAP therapy.  He she was unresponsive to nebulizer treatment for COPD.  She reports that her shortness of breath eventually got better with medication.  Echocardiogram performed on 12/08/2023 demonstrated EF 55%.  Patient eventually developed septic shock with bacteremia with E. coli that thought to be secondary to urosepsis for which she was placed on IV antibiotic.  Subsequent high-sensitivity troponin trended up to greater than 60,000 and the patient was managed as NSTEMI.  She was placed on aspirin  and Plavix  and IV heparin .  Given sepsis, it was opted not to pursue urgent coronary angiography.  Heart rate was in the 40s to 50s during the hospitalization with occasional sinus pause.  Per cardiology consult note over there, she may had intermittent complete heart block.  She did not wish to stay in [place]  and requested to be transferred to [facility] to further care.  ECG in the hospital showed likely anterior infarct.  Echocardiogram obtained on 12/12/2023 showed EF 50 to 55%, low normal EF, moderate LVH, grade 1 DD, inferior wall and mid inferolateral segment were akinetic.  Troponin was up to 15,000.  She also had a left partial staghorn calculus with mild hydronephrosis, urology service was consulted who recommended left nephrostomy tube placement.  Plavix  was held in anticipation of urology procedure, she was consider a high risk candidate for low risk procedure.  She was seen by Dr. Arlester Ladd during the hospitalization, considering her baseline functional status with  significant physical impairment from her stroke as well as multiple medical issues, he did not think the patient was a good candidate for invasive evaluation with cardiac catheterization.  Once she recovered from her acute illness, outpatient stress test can be considered.  Outpatient Myoview  was eventually completed on 12/16/2023, this showed a high risk study, there was no evidence of infarction but there was a large defect of severe reduction in uptake present in the apical to basal anterior, anterolateral and anteroseptal, lateral and apex location that is consistent with ischemia in the LAD territory.  Patient presents today to cardiology office visit along with husband.  She denies any recent chest discomfort.  She has chronic residual right upper extremity and right lower extremity weakness.  I reviewed recent hospitalization record with the patient and her husband.  I also discussed her case with DOD Dr. Sw[place], she is quite complicated, if we were to proceed with urology surgery first, she would be a very high risk candidate for the intended procedure given large area of ischemia in the anterior wall.  Alternatively, if we were to proceed with cardiac catheterization first and PCI, we may have to place the patient on dual antiplatelet therapy afterward and that that may interfere with her urology procedure.  Also her EKG today showed evidence of advanced AV dissociation.  Dr. Swaziland recommended proceed with diagnostic cardiac catheterization prior to her urology surgery.  I placed the patient on Dr. Filiberto Hug scheduled for next Tuesday since her urology surgery is next Wednesday.  I reviewed and discussed her case with Dr. Filiberto Hug as well who is willing to do the procedure.  Depending on the result of cardiac catheterization, Dr. Filiberto Hug may let her proceed with urology procedure or try to delay the surgery.  I will see the patient back in 2 weeks, potentially will consider a heart monitor at that  time given evidence of advanced A-V dissociation on today's EKG.  I have sent a staff message to Dr. Dulcy Gibney of urology service and Dr. Filiberto Hug to make sure they are aware of the current plan.  ROS:   She denies chest pain, palpitations, dyspnea, pnd, orthopnea, n, v, dizziness, syncope, edema, weight gain, or early satiety. All other systems reviewed and are otherwise negative except as noted above.   She has chronic right-sided weakness involving both right upper and the right lower extremity.  Studies Reviewed      Cardiac Studies & Procedures   ______________________________________________________________________________________________   STRESS TESTS  MYOCARDIAL PERFUSION IMAGING 12/16/2023  Narrative   Findings are consistent with ischemia. The study is high risk.   No ST deviation was noted. The ECG was not diagnostic due to pharmacologic protocol.   LV perfusion is abnormal. There is evidence of ischemia. There is no evidence of infarction. Defect 1: There is a large defect with severe reduction in uptake present in the apical to basal anterior, anterolateral, anteroseptal, lateral and apex location(s) that is reversible. Viability is present. There is abnormal wall motion in the defect area. Consistent with ischemia.   Left ventricular function is abnormal. Global function is moderately reduced. Nuclear stress  EF: 44%. The left ventricular ejection fraction is moderately decreased (30-44%). End diastolic cavity size is mildly enlarged. End systolic cavity size is normal.   CT images were obtained for attenuation correction and were examined for the presence of coronary calcium  when appropriate.   Coronary calcium  was present on the attenuation correction CT images. Severe coronary calcifications were present. Coronary calcifications were present in the left anterior descending artery distribution(s) and to a lesser extent in the LCX and RCA. Aortic atherosclerosis was also  noted.   Prior study not available for comparison.   ECG rhythm shows normal sinus rhythm.  Large size, severe severity reversible anterior, anteroseptal, anterolateral, lateral and apical perfusion defect, suggestive of LAD territory/diagonal ischemia (SDS 9). LVEF 44% with severe hypokinesis corresponding to the areas of perfusion defect. CT imaging demonstrates multivessel coronary calcium  with severe LAD and diagonal branch calcification. Overall this is a high risk study given the size of the perfusion defect. No prior study available for comparison. Definitive cardiac catheterization is recommended.   ECHOCARDIOGRAM  ECHOCARDIOGRAM LIMITED 12/12/2023  Narrative ECHOCARDIOGRAM LIMITED REPORT    Patient Name:   ZOUA CAPORASO Date of Exam: 12/12/2023 Medical Rec #:  [id number]         Height:       63.0 in Accession #:    [id number]        Weight:       194.9 lb Date of Birth:  [birth year]        BSA:          1.912 m Patient Age:    62 years          BP:           157/64 mmHg Patient Gender: F                 HR:           49 bpm. Exam Location:  Inpatient  Procedure: 2D Echo, Limited Echo, Cardiac Doppler and Color Doppler (Both Spectral and Color Flow Doppler were utilized during procedure).  Indications:    NSTEMI  History:        Patient has prior history of Echocardiogram examinations, most recent 10/27/2017.  Sonographer:    Janette Medley Referring Phys: [id number] KAMAL H HENDERSON  IMPRESSIONS   1. Left ventricular ejection fraction, by estimation, is 50 to 55%. The left ventricle has low normal function. The left ventricle demonstrates regional wall motion abnormalities (see scoring diagram/findings for description). There is moderate left ventricular hypertrophy. Left ventricular diastolic parameters are consistent with Grade I diastolic dysfunction (impaired relaxation). 2. The aortic valve is calcified. There is moderate calcification of the aortic valve. Aortic  valve regurgitation is not visualized. 3. The inferior vena cava is normal in size with greater than 50% respiratory variability, suggesting right atrial pressure of 3 mmHg.  FINDINGS Left Ventricle: Left ventricular ejection fraction, by estimation, is 50 to 55%. The left ventricle has low normal function. The left ventricle demonstrates regional wall motion abnormalities. There is moderate left ventricular hypertrophy. Left ventricular diastolic parameters are consistent with Grade I diastolic dysfunction (impaired relaxation).   LV Wall Scoring: The inferior wall and mid inferolateral segment are akinetic.  Left Atrium: Left atrial size was normal in size.  Aortic Valve: The aortic valve is calcified. There is moderate calcification of the aortic valve. Aortic valve regurgitation is not visualized.  Aorta: The aortic root and ascending aorta are structurally normal, with no evidence of dilitation.  Venous: The inferior vena cava is normal in size with greater than 50% respiratory variability, suggesting right atrial pressure of 3 mmHg.  LEFT VENTRICLE PLAX 2D LVIDd:         4.20 cm   Diastology LVIDs:         2.70 cm   LV e' medial:   6.96 cm/s LV PW:         1.20 cm   LV E/e' medial: 13.9 LV IVS:        1.40 cm LVOT diam:     2.00 cm LVOT Area:     3.14 cm   RIGHT VENTRICLE            IVC RV S prime:     9.46 cm/s  IVC diam: 1.20 cm TAPSE (M-mode): 2.0 cm  LEFT ATRIUM         Index LA diam:    3.50 cm 1.83 cm/m  AORTA Ao Root diam: 3.00 cm Ao Asc diam:  3.20 cm  MITRAL VALVE MV Area (PHT): 2.26 cm     SHUNTS MV Decel Time: 335 msec     Systemic Diam: 2.00 cm MV E velocity: 96.40 cm/s MV A velocity: 113.00 cm/s MV E/A ratio:  0.85  Dorothye Gathers MD Electronically signed by Dorothye Gathers MD Signature Date/Time: 12/12/2023/11:47:27 AM    Final    MONITORS  CARDIAC EVENT MONITOR 12/01/2017        ______________________________________________________________________________________________          Physical Exam VS:  BP (!) 145/71   Pulse (!) 54   Temp 98.7 F (37.1 C) (Oral)   Resp 18   Ht 5\' 3"  (1.6 m)   Wt 86.2 kg   LMP 12/10/2012 (Exact Date)   SpO2 94%   BMI 33.66 kg/m    Wt Readings from Last 3 Encounters:  12/28/23 86.2 kg  12/24/23 87.5 kg  12/23/23 87.1 kg    GEN: Well nourished, well developed in no acute distress NECK: No JVD; No carotid bruits CARDIAC: RRR, no murmurs, rubs, gallops RESPIRATORY:  Clear to auscultation without rales, wheezing or rhonchi  ABDOMEN: Soft, non-tender, non-distended EXTREMITIES:  No edema; No deformity   ASSESSMENT AND PLAN  Abnormal stress test: Recently was admitted with NSTEMI and urosepsis.  I discussed the case with Dr. Filiberto Hug and Dr. [place], patient has upcoming urological surgery, given the large area of ischemia in the anterior wall, she would be high risk for the intended surgery.  Dr. Sw[place]and recommended proceed with diagnostic cardiac catheterization, I discussed her case with Dr. Filiberto Hug who will be doing the procedure next Tuesday.  Risk and benefit of the surgery has been discussed with the patient who is agreeable to proceed.  NSTEMI: See #1  Hypertension: Blood pressure elevated today, 2 of her usual blood pressure medications currently on hold  Hyperlipidemia: On rosuvastatin   History of CVA: Chronic right upper and lower extremity weakness.  Gets around with a wheelchair, can only walk very short distance with walker at home  COPD: No acute exacerbation, although I suspect she has quite significant COPD at baseline, she has not required home oxygen  Kidney stone: Recent admitted for urosepsis.  Seen by urology service, pending upcoming urological surgery on Wednesday.  Advanced AV block: Seen on today's EKG.  She has high-grade conduction disease.  Will need to monitor heart rate closely during  procedure or surgery.   Shared Decision Making/Informed Consent The risks [stroke (1 in 1000), death (1  in 1000), kidney failure [usually temporary] (1 in 500), bleeding (1 in 200), allergic reaction [possibly serious] (1 in 200)], benefits (diagnostic support and management of coronary artery disease) and alternatives of a cardiac catheterization were discussed in detail with Ms. Belcourt and she is willing to proceed.        Dispo: Follow-up in 2 to 4 weeks.  Signed, Cody Das, MD

## 2023-12-28 NOTE — Evaluation (Signed)
 Clinical/Bedside Swallow Evaluation Patient Details  Name: Kelly Gibson MRN: [id number] Date of Birth: 23-Jun-1961  Today's Date: 12/28/2023 Time: SLP Start Time (ACUTE ONLY): 1340 SLP Stop Time (ACUTE ONLY): 1400 SLP Time Calculation (min) (ACUTE ONLY): 20 min  Past Medical History:  Past Medical History:  Diagnosis Date   Bradycardia    SR/SB with up to 3.5 second pause (asymptomatic) on ~ 10/2017 event monitor; saw EP Dr. Nunzio Belch: avoid AV nodal blocking agents   Chronic kidney disease    COPD (chronic obstructive pulmonary disease) (HCC)    Depression    Depression with anxiety 08/29/2021   Diabetes mellitus    Dysarthria, post-stroke    Dysrhythmia    Ganglion cyst 03/09/2012   History of kidney stones    Hyperlipidemia    Hypertension    Hypokalemia    Myocardial infarction [facility])    NSTEMI   Obesity, unspecified 12/17/2011   Pneumonia    PONV (postoperative nausea and vomiting)    Skin lesion of face 11/17/2022   Stroke [facility])    2019   right side hemiparesis   Past Surgical History:  Past Surgical History:  Procedure Laterality Date   CESAREAN SECTION     x 3   ORIF WRIST FRACTURE Right 05/04/2018   Procedure: OPEN REDUCTION INTERNAL FIXATION (ORIF) RIGHT  WRIST FRACTURE;  Surgeon: Micheline Ahr, MD;  Location: [facility] OR;  Service: Orthopedics;  Laterality: Right;   TUMOR REMOVAL     left shoulder   HPI:  Patient is a 63 y/o F presenting for L heart cath and coronary angiography due to abnormal stress test - ischemia. Cardiologist observed "vigorous coughing along with wheezing with brief hypoxia after consumption of sandwhich." Patient with CVA 6 years ago, however d/c home on regular/thin diet from CIR. SLP consulted for bedside swallow evaluation.    Assessment / Plan / Recommendation  Clinical Impression  A bedside swallow evaluation was completed to assess for s/sx of oropharyngeal dysphagia due to choking episode after L heart cath and coronary angiography.  Oral mechanism exam WFL with no changes from baseline. POs administered included thin liquids via cup and straw and solids. Patient with timely mastication and complete oral clearance. Patient with x1 immediate throat clear when attempting to speak with SLP. No other s/sx of aspiration present despite completion of sequential sips via straw. Recommend regular/thin diet with use of standardized precautions including sitting upright, taking small bites/sips at a slow rate and completion of OP MBS if an increase in coughing/throat clears is observed during PO.  SLP Visit Diagnosis: Dysphagia, unspecified (R13.10)    Aspiration Risk  Mild aspiration risk    Diet Recommendation Regular;Thin liquid    Liquid Administration via: Straw;Cup Medication Administration: Whole meds with liquid Supervision: Patient able to self feed Compensations: Minimize environmental distractions;Slow rate;Small sips/bites Postural Changes: Seated upright at 90 degrees    Other  Recommendations Oral Care Recommendations: Oral care BID    Recommendations for follow up therapy are one component of a multi-disciplinary discharge planning process, led by the attending physician.  Recommendations may be updated based on patient status, additional functional criteria and insurance authorization.  Follow up Recommendations No SLP follow up      Functional Status Assessment Patient has had a recent decline in their functional status and demonstrates the ability to make significant improvements in function in a reasonable and predictable amount of time.      Prognosis Prognosis for improved oropharyngeal function: Good  Swallow Study   General HPI: Patient is a 63 y/o F presenting for L heart cath and coronary angiography due to abnormal stress test - ischemia. Cardiologist observed "vigorous coughing along with wheezing with brief hypoxia after consumption of sandwhich." Patient with CVA 6 years ago, however d/c home  on regular/thin diet from CIR. SLP consulted for bedside swallow evaluation. Type of Study: Bedside Swallow Evaluation Previous Swallow Assessment: 6 years ago Diet Prior to this Study: Regular;Thin liquids (Level 0) Respiratory Status: Room air (just weaned from 2L [place]) Behavior/Cognition: Alert;Cooperative Oral Cavity Assessment: Within Functional Limits Oral Care Completed by SLP: No Oral Cavity - Dentition: Adequate natural dentition Vision: Functional for self-feeding Self-Feeding Abilities: Able to feed self Patient Positioning: Upright in bed Baseline Vocal Quality: Hoarse (reports hoarseness since CVA) Volitional Cough: Strong Volitional Swallow: Able to elicit    Oral/Motor/Sensory Function Overall Oral Motor/Sensory Function: Within functional limits   Ice Chips Ice chips: Not tested   Thin Liquid Thin Liquid: Impaired Presentation: Cup;Straw Pharyngeal  Phase Impairments: Throat Clearing - Immediate Other Comments: x1 throat clear before speaking after sips of thin liquids    Nectar Thick Nectar Thick Liquid: Not tested   Honey Thick Honey Thick Liquid: Not tested   Puree Puree: Not tested   Solid     Solid: Within functional limits Presentation: Self Fed      Baani Bober M.A., [name] 12/28/2023,2:02 PM

## 2023-12-28 NOTE — Interval H&P Note (Signed)
 History and Physical Interval Note:  12/28/2023 8:31 AM  Kelly Gibson  has presented today for surgery, with the diagnosis of abnormal stress stest - ischemia.  The various methods of treatment have been discussed with the patient and family. After consideration of risks, benefits and other options for treatment, the patient has consented to  Procedure(s): LEFT HEART CATH AND CORONARY ANGIOGRAPHY (N/A) as a surgical intervention.  The patient's history has been reviewed, patient examined, no change in status, stable for surgery.  I have reviewed the patient's chart and labs.  Questions were answered to the patient's satisfaction.     Silvanna Ohmer J Grace Haggart

## 2023-12-28 NOTE — Progress Notes (Signed)
  Patient seen following speech evaluation and EP evaluation. Bedside swallow evaluation reassuring with regular/thin liquid diet recommended. Per EP, sinus node dysfunction with stable junctional escape rhythm, narrow QRS. No indication for pacing at this time. I also spent time discussing heart cath and plans for blood pressure control. Her case will be discussed at interventional meeting Friday. Per Dr. Filiberto Hug, will prescribe Hydralazine  100mg  BID. Patient stable to discharge home.   Leala Prince, PA-C

## 2023-12-28 NOTE — Progress Notes (Signed)
 Multiple medical issues addressed as follows  I came to talk to patient and her husband.  Patient was eating sandwich and started coughing vigorously, along with wheezing with brief hypoxia.  All of this returned back to baseline.  Talking to patient and husband, it appears that she was on modified diet after her stroke 6 years ago, but went back to regular diet by herself.  I am concerned that she may still have a degree of dysphagia and is at risk for aspiration.  I requested a swallow evaluation before her discharge.  Appreciate their help.  Her EKG in short stay is concerning for sinus pauses with junctional rhythm with intermittent P waves.  Prior EKGs few days ago were concerning for at least isorhythmic A-V dissociation.  I am concerned that all of this could be ischemic mediated.  Obviously, pacemaker without addressing her ischemia may not be ideal.  Also, her staghorn calculus and recent sepsis also increases risk of infections.  She reportedly had intermittent complete AV block on admission recently, but had subsequently not occurred.  I will request EP consult for a plan going forward.  Finally, her amlodipine  was held due to concern with bradycardia, tamsulosin was held due to concern with renal function.  As result, she is not on any antihypertensive medications at this time.  Her systolic blood pressure has been as high as 170s here.  Pending swallow eval, will give IV hydralazine .  After swallow eval is completed, will discharge on hydralazine  100 mg twice daily.  CRITICAL CARE Performed by: Fransico Ivy   Total critical care time: 35 minutes   Critical care time was exclusive of separately billable procedures and treating other patients.   Critical care was necessary to treat or prevent imminent or life-threatening deterioration.   Critical care was time spent personally by me on the following activities: development of treatment plan with patient and/or surrogate as well as  nursing, discussions with consultants, evaluation of patient's response to treatment, examination of patient, obtaining history from patient or surrogate, ordering and performing treatments and interventions, ordering and review of laboratory studies, ordering and review of radiographic studies, pulse oximetry and re-evaluation of patient's condition.

## 2023-12-28 NOTE — Consult Note (Signed)
 Cardiology Consultation   Patient ID: [name] MRN: [id number]; DOB: 10/15/1960  Admit date: 12/28/2023 Date of Consult: 12/28/2023  PCP:  Kelly Kief, NP    HeartCare Providers Cardiologist:  Hazle Lites, MD     Patient Profile: Kelly Gibson is a 63 y.o. female with a hx of  Stroke, DM, HTN, COPD Apparently known to have hx of asymptomatic bradycardia/nocturnal pauses (back in 2019 seen during her hospital stay with stroke) who is being seen 12/28/2023 for the evaluation of consuction system disease at the request of Dr. Filiberto Hug.  History of Present Illness: Kelly Gibson was admitted initially while out of town in [place]  for progressive SOB on 12/08/23 complaining of shortness of breath. She was noted to have acute hypoxic respiratory failure initially requiring BiPap and unresponsive to nebulizer treatment for COPD. Chest CT angiography ruled out pulmonary embolism but left basilar consolidation was concerning for likely pneumonia. Initial HS-troponin was 282 ng/L. Initial echocardiogram done on 12/08/23 demonstrated normal left ventricular function (EF ~55%). Patient eventually developed what appears to be sepsis with septic shock with noted bacteremia with E. Coli, thought secondary to urosepsis for which she was placed on IV antibiotics. Subsequent repeat HS-troponin went as high as >60k ng/L, for which patient was managed as NSTEMI-ACS. She was placed on heparin  drip, aspirin , and clopidogrel . Given sepsis, it was opted not to pursue urgent coronary angiography given relative contra-indication. It appears during her hospital course on 12/10/23, telemetry demonstrated sinus bradycardia between 40 bpm to 50 bpm with occasional sinus pauses. Per cardiology consult note, concerning for intermittent complete heart block At the family's request transferred to [facility] to be closer to home for further management on 5/18 Followed by our cardiology team NSTEMI treated  medically  with LVEF of 50-55%, g1DD, moderate LVH, inferior and mid inferolateral akinesis  Pending management of her urosepsis, +/- nephrostomy tube w/ a known staghorn with mild hydronephrosis , plans for cath /intervention held and treated medically  Note reports no recurrent CHB noted, + for SB and junctional bradycardia and nodal blockers avoided Given her significant physical impairment from her stroke as well as multiple medical issues, they did not think the patient was a good candidate for invasive evaluation with cardiac catheterization. Once she recovered from her acute illness, outpatient stress test can be considered.   Outpatient Myoview  was eventually completed on 12/16/2023, this showed a high risk study, there was no evidence of infarction but there was a large defect of severe reduction in uptake present in the apical to basal anterior, anterolateral and anteroseptal, lateral and apex location that is consistent with ischemia in the LAD territory.   At her visit with cards in the office 12/24/23, EKG was done and found to have evidence of AV dissociation ( V rate 57) Discussions on treatment  Ultimately planned for cath  >>pending that discussions on her urology procedures (that were tentatively planned for Wed 12/29/23)  TODAY Her cath noted Coronary angiography 12/28/2023: LM: No significant disease LAD: Moderate sized vessel.          Mid vessel occlusion with moderate calcification, after a large septal branch          Distal vessel filled with collaterals from septal branches Lcx: Moderate sized vessel         Small OM1 with prox 70% stenosis         Medium caliber OM2 with prox occlusion         Distal  vessel filled with collaterals from septal branches RCA: Small to medium caliber dominant vessel with prox diffuse 50% disease           Mid vessel occlusion           Distal RCA/RPDA filled with collaterals from septal branches   LVEDP 18 mmHg Dr. Filiberto Hug  notes: ... Echocardiogram showed inferior akinesis but preserved LVEF around 55%. Stress test showed no infarct, but anterior ischemia with stress EF 44%. Coronary angiogram today shows 3 distinct occlusions of mid LAD, proximal OM 2, mid RCA with faint collateralization. In addition, she has at least isorhythmic A-V dissociation, if not intermittent complete AV block on EKG. I suspect this is also ischemic in etiology. She is hemodynamically stable at this time.  Ideally, her coronary anatomy is suitable for surgical revascularization.  However, multiple comorbidities could limit surgical candidacy.  I would like to discuss her case in multidisciplinary heart team meeting for further decision-making. ... --- Her EKG in short stay is concerning for sinus pauses with junctional rhythm with intermittent P waves. Prior EKGs few days ago were concerning for at least isorhythmic A-V dissociation. I am concerned that all of this could be ischemic mediated. Obviously, pacemaker without addressing her ischemia may not be ideal. Also, her staghorn calculus and recent sepsis also increases risk of infections. She reportedly had intermittent complete AV block on admission recently, but had subsequently not occurred. I will request EP consult for a plan going forward.  Also a swallow eval noting some dysphagia is pending  All of this pending prior to hir going home from short stay area if felt reasonable from EP perspective, and any monitoring recommendations  The patient is in post cath/short stay area, her husband at bedside. She is not on telemetry She denies any active complaints She reports being ambulatory post stroke No CP, reports of some DOE She/they deny any history of dizzy spells, near syncope or any syncopal events  Past Medical History:  Diagnosis Date   Bradycardia    SR/SB with up to 3.5 second pause (asymptomatic) on ~ 10/2017 event monitor; saw EP Dr. Nunzio Belch: avoid AV nodal blocking agents    Chronic kidney disease    COPD (chronic obstructive pulmonary disease) (HCC)    Depression    Depression with anxiety 08/29/2021   Diabetes mellitus    Dysarthria, post-stroke    Dysrhythmia    Ganglion cyst 03/09/2012   History of kidney stones    Hyperlipidemia    Hypertension    Hypokalemia    Myocardial infarction [facility])    NSTEMI   Obesity, unspecified 12/17/2011   Pneumonia    PONV (postoperative nausea and vomiting)    Skin lesion of face 11/17/2022   Stroke (HCC)    2019   right side hemiparesis    Past Surgical History:  Procedure Laterality Date   CESAREAN SECTION     x 3   ORIF WRIST FRACTURE Right 05/04/2018   Procedure: OPEN REDUCTION INTERNAL FIXATION (ORIF) RIGHT  WRIST FRACTURE;  Surgeon: Micheline Ahr, MD;  Location: [facility] OR;  Service: Orthopedics;  Laterality: Right;   TUMOR REMOVAL     left shoulder     Home Medications:  Prior to Admission medications   Medication Sig Start Date End Date Taking? Authorizing Provider  albuterol  (VENTOLIN  HFA) 108 (90 Base) MCG/ACT inhaler Inhale 2 puffs into the lungs every 6 (six) hours as needed for wheezing or shortness of breath. 12/14/23  Yes Ada Acres,  A Glory Larsen., MD  aspirin  EC 81 MG tablet Take 1 tablet (81 mg total) by mouth daily. Swallow whole. 12/15/23  Yes Etter Hermann., MD  budesonide -glycopyrrolate -formoterol  (BREZTRI  AEROSPHERE) 160-9-4.8 MCG/ACT AERO inhaler Inhale 2 puffs into the lungs 2 (two) times daily. 12/14/23  Yes Etter Hermann., MD  cefadroxil  (DURICEF) 500 MG capsule Take 2 capsules (1,000 mg total) by mouth 2 (two) times daily for 15 days. 12/14/23 12/29/23 Yes Etter Hermann., MD  citalopram  (CELEXA ) 10 MG tablet TAKE 1 TABLET BY MOUTH DAILY 06/28/23  Yes Early, Sara E, NP  Continuous Glucose Sensor (DEXCOM G7 SENSOR) MISC Take 1 Device by mouth See admin instructions. Change sensor every 10 days   Yes [provider]  Doxylamine Succinate, Sleep, (UNISOM PO) Take 2  each by mouth at bedtime. Gel capsules   Yes [provider]  furosemide  (LASIX ) 20 MG tablet Take 1 tablet (20 mg total) by mouth daily as needed. Patient taking differently: Take 20 mg by mouth daily as needed for edema or fluid. 10/05/22  Yes Early, Sara E, NP  HUMALOG  KWIKPEN 100 UNIT/ML KwikPen INJECT 3 TO 5 UNITS INTO THE SKIN DAILY BEFORE LUNCH Patient taking differently: Inject 2-10 Units into the skin with breakfast, with lunch, and with evening meal. 11/29/23  Yes Early, Sara E, NP  Melatonin 5 MG CHEW Chew 10 mg by mouth at bedtime.   Yes [provider]  Multiple Vitamins-Minerals (MULTIVITAMIN GUMMIES WOMENS) CHEW Chew 2 each by mouth in the morning.   Yes [provider]  rosuvastatin  (CRESTOR ) 40 MG tablet TAKE 1 TABLET BY MOUTH DAILY Patient taking differently: Take 40 mg by mouth at bedtime. 04/08/23  Yes Early, Sara E, NP  tiZANidine  (ZANAFLEX ) 2 MG tablet TAKE 1-2 TABLETS UP TO TWICE A DAY  AS NEEDED FOR MUSCLE SPASMS Patient taking differently: Take 2 mg by mouth at bedtime. 05/11/23  Yes Early, Adriane Albe, NP  UNABLE TO FIND Take 1 tablet by mouth with breakfast, with lunch, and with evening meal. GlucoGold -Berberine, Concentrated Cinnamon, Chromium, Banaba Leaf Extract   Yes [provider]  amLODipine  (NORVASC ) 5 MG tablet Take 1 tablet (5 mg total) by mouth daily. Patient not taking: Reported on 12/23/2023 05/12/23   Early, Adriane Albe, NP  insulin  degludec (TRESIBA  FLEXTOUCH) 100 UNIT/ML FlexTouch Pen Inject 10 Units into the skin 2 (two) times daily. Adjust by 2 units every 3 days as needed for blood sugars higher than 150 in the morning fasting. Patient taking differently: Inject 10-22 Units into the skin 2 (two) times daily. Take 22 units in the morning and take 10 units at night. Adjust by 2 units every 3 days as needed for blood sugars higher than 150 in the morning fasting. 10/05/22   Early, Sara E, NP  olmesartan (BENICAR) 40 MG tablet Take 40 mg  by mouth in the morning. Patient not taking: Reported on 12/23/2023 05/12/23   [provider]  TRADJENTA  5 MG TABS tablet Take 5 mg by mouth daily. Patient not taking: Reported on 12/11/2023    [provider]    Scheduled Meds:  [START ON 12/29/2023] aspirin   81 mg Oral Pre-Cath   sodium chloride  flush  3 mL Intravenous Q12H   Continuous Infusions:  sodium chloride      sodium chloride  1 mL/kg/hr (12/28/23 0724)   PRN Meds: sodium chloride , acetaminophen , fentaNYL , Heparin  (Porcine) in NaCl, hydrALAZINE , iohexol , lidocaine  (PF), midazolam , ondansetron  (ZOFRAN ) IV, sodium chloride   flush  Allergies:    Allergies  Allergen Reactions   Latex Itching and Rash   Niacin Nausea And Vomiting    Social History:   Social History   Socioeconomic History   Marital status: Married    Spouse name: Not on file   Number of children: Not on file   Years of education: Not on file   Highest education level: Not on file  Occupational History   Not on file  Tobacco Use   Smoking status: Former    Current packs/day: 1.00    Average packs/day: 1 pack/day for 20.0 years (20.0 ttl pk-yrs)    Types: Cigarettes   Smokeless tobacco: Never   Tobacco comments:    stopped 2018  Vaping Use   Vaping status: Never Used  Substance and Sexual Activity   Alcohol use: No   Drug use: No   Sexual activity: Not Currently  Other Topics Concern   Not on file  Social History Narrative   Not on file   Social Drivers of Health   Financial Resource Strain: Medium Risk (04/08/2023)   Overall Financial Resource Strain (CARDIA)    Difficulty of Paying Living Expenses: Somewhat hard  Food Insecurity: No Food Insecurity (12/11/2023)   Hunger Vital Sign    Worried About Running Out of Food in the Last Year: Never true    Ran Out of Food in the Last Year: Never true  Transportation Needs: No Transportation Needs (12/11/2023)   PRAPARE - Administrator, Civil Service (Medical): No     Lack of Transportation (Non-Medical): No  Physical Activity: Insufficiently Active (04/08/2023)   Exercise Vital Sign    Days of Exercise per Week: 3 days    Minutes of Exercise per Session: 10 min  Stress: No Stress Concern Present (04/08/2023)   [employer] of Occupational Health - Occupational Stress Questionnaire    Feeling of Stress : Not at all  Social Connections: Socially Integrated (04/08/2023)   Social Connection and Isolation Panel [NHANES]    Frequency of Communication with Friends and Family: Three times a week    Frequency of Social Gatherings with Friends and Family: Twice a week    Attends Religious Services: 1 to 4 times per year    Active Member of [employer] or Organizations: Yes    Attends Engineer, structural: More than 4 times per year    Marital Status: Married  Catering manager Violence: Not At Risk (12/13/2023)   Humiliation, Afraid, Rape, and Kick questionnaire    Fear of Current or Ex-Partner: No    Emotionally Abused: No    Physically Abused: No    Sexually Abused: No    Family History:   Family History  Problem Relation Age of Onset   Diabetes Mother    Stroke Mother    Heart disease Father    Cancer Father    Stroke Father      ROS:  Please see the history of present illness.  All other ROS reviewed and negative.     Physical Exam/Data: Vitals:   12/28/23 1144 12/28/23 1220 12/28/23 1221 12/28/23 1245  BP: (!) 168/77 (!) 157/72 (!) 143/58 (!) 127/55  Pulse: (!) 46 (!) 50 (!) 56 (!) 51  Resp:      Temp:      TempSrc:      SpO2: 100% 98% 98% 97%  Weight:      Height:       No intake or output  data in the 24 hours ending 12/28/23 1335    12/28/2023    6:11 AM 12/24/2023    3:11 PM 12/23/2023   11:14 AM  Last 3 Weights  Weight (lbs) 190 lb 193 lb 192 lb  Weight (kg) 86.183 kg 87.544 kg 87.091 kg     Body mass index is 33.66 kg/m.  General:  Well nourished, well developed, in no acute distress HEENT: normal Neck: no  JVD Vascular: No carotid bruits; Distal pulses 2+ bilaterally Cardiac:  RRR; no murmurs, gallops or rubs Lungs:  CTA b/l, no wheezing, rhonchi or rales  Abd: soft, nontender Ext: no edema Musculoskeletal:  No deformities Skin: warm and dry  Psych:  Normal affect   EKG:  The EKG was personally reviewed and demonstrates:    Accelrated junctional rhythm 50bpm, with some conducted beats, narrow QRS  OLD 12/24/23: A rate 45, V rate 57 with some suspect AV conducted beats, narrow QRS 12/11/23: SB 55bpm, 1:1 conduction though variable PR durations (suggesting Mobitz one) narrow QRS  Telemetry:  she is not on telemetry  Relevant CV Studies:   MYOCARDIAL PERFUSION IMAGING 12/16/2023 Narrative   Findings are consistent with ischemia. The study is high risk.   No ST deviation was noted. The ECG was not diagnostic due to pharmacologic protocol.   LV perfusion is abnormal. There is evidence of ischemia. There is no evidence of infarction. Defect 1: There is a large defect with severe reduction in uptake present in the apical to basal anterior, anterolateral, anteroseptal, lateral and apex location(s) that is reversible. Viability is present. There is abnormal wall motion in the defect area. Consistent with ischemia.   Left ventricular function is abnormal. Global function is moderately reduced. Nuclear stress EF: 44%. The left ventricular ejection fraction is moderately decreased (30-44%). End diastolic cavity size is mildly enlarged. End systolic cavity size is normal.   CT images were obtained for attenuation correction and were examined for the presence of coronary calcium  when appropriate.   Coronary calcium  was present on the attenuation correction CT images. Severe coronary calcifications were present. Coronary calcifications were present in the left anterior descending artery distribution(s) and to a lesser extent in the LCX and RCA. Aortic atherosclerosis was also noted.   Prior study not  available for comparison.   ECG rhythm shows normal sinus rhythm.   Large size, severe severity reversible anterior, anteroseptal, anterolateral, lateral and apical perfusion defect, suggestive of LAD territory/diagonal ischemia (SDS 9). LVEF 44% with severe hypokinesis corresponding to the areas of perfusion defect. CT imaging demonstrates multivessel coronary calcium  with severe LAD and diagonal branch calcification. Overall this is a high risk study given the size of the perfusion defect. No prior study available for comparison. Definitive cardiac catheterization is recommended.      ECHOCARDIOGRAM LIMITED 12/12/2023 ECHOCARDIOGRAM LIMITED REPORT IMPRESSIONS 1. Left ventricular ejection fraction, by estimation, is 50 to 55%. The left ventricle has low normal function. The left ventricle demonstrates regional wall motion abnormalities (see scoring diagram/findings for description). There is moderate left ventricular hypertrophy. Left ventricular diastolic parameters are consistent with Grade I diastolic dysfunction (impaired relaxation). 2. The aortic valve is calcified. There is moderate calcification of the aortic valve. Aortic valve regurgitation is not visualized. 3. The inferior vena cava is normal in size with greater than 50% respiratory variability, suggesting right atrial pressure of 3 mmHg.    Laboratory Data: High Sensitivity Troponin:   Recent Labs  Lab 12/11/23 1830 12/11/23 2100  TROPONINIHS 14,726* 15,142*  ChemistryNo results for input(s): "NA", "K", "CL", "CO2", "GLUCOSE", "BUN", "CREATININE", "CALCIUM ", "MG", "GFRNONAA", "GFRAA", "ANIONGAP" in the last 168 hours.  No results for input(s): "PROT", "ALBUMIN", "AST", "ALT", "ALKPHOS", "BILITOT" in the last 168 hours. Lipids No results for input(s): "CHOL", "TRIG", "HDL", "LABVLDL", "LDLCALC", "CHOLHDL" in the last 168 hours.  HematologyNo results for input(s): "WBC", "RBC", "HGB", "HCT", "MCV", "MCH", "MCHC", "RDW",  "PLT" in the last 168 hours. Thyroid No results for input(s): "TSH", "FREET4" in the last 168 hours.  BNPNo results for input(s): "BNP", "PROBNP" in the last 168 hours.  DDimer No results for input(s): "DDIMER" in the last 168 hours.  Radiology/Studies:     Assessment and Plan:   Conduction system disease Narrow QRS  No near syncope or syncope No nodal blocking agents noted on med list  Agree with recent bacteremia/sepsis and really unresolved urological issues, pending percutaneous nephrolithotomy, PPM implant would be less then ideal Dr. Filiberto Hug also has some suspicion that her conduction system disease may have some ischemic etiology as well  I suspect that without clear symptoms of bradycardia, no reports of syncope and narrow QRS > would not pursue pacing at this juncture. Dr. Arlester Ladd will see her, for final recommendations regarding monitoring plans going forward from EP perspective   Dr. Arlester Ladd has seen the patient She denied as well with him any dizziness, fainting, near fainting Has sinus node dysfunction with stable junctional escape/rhythm, narrow QRS With no symptoms, stable BP, would not require pacing at this juncture. Continue to avoid nodal blocking agents Continue management of her CAD as per Dr. Kevin Pellant cardiology team and their recommendations regarding timing of her urological procedures   For questions or updates, please contact [facility] Please consult [email or web address] for contact info under    Signed, Debbie Fails, PA-C  12/28/2023 1:35 PM

## 2023-12-29 ENCOUNTER — Encounter (HOSPITAL_COMMUNITY): Admission: RE | Payer: Self-pay | Source: Home / Self Care

## 2023-12-29 ENCOUNTER — Ambulatory Visit (HOSPITAL_COMMUNITY): Admission: RE | Admit: 2023-12-29 | Source: Home / Self Care | Admitting: Urology

## 2023-12-29 SURGERY — NEPHROLITHOTOMY PERCUTANEOUS
Anesthesia: General | Laterality: Left

## 2023-12-30 ENCOUNTER — Emergency Department (HOSPITAL_COMMUNITY)

## 2023-12-30 ENCOUNTER — Encounter (HOSPITAL_COMMUNITY): Payer: Self-pay | Admitting: *Deleted

## 2023-12-30 ENCOUNTER — Inpatient Hospital Stay (HOSPITAL_COMMUNITY)
Admission: EM | Admit: 2023-12-30 | Discharge: 2024-01-04 | DRG: 264 | Disposition: A | Discharge status: Home / Self Care | Attending: Internal Medicine | Admitting: Internal Medicine

## 2023-12-30 ENCOUNTER — Telehealth: Payer: Self-pay | Admitting: Internal Medicine

## 2023-12-30 ENCOUNTER — Other Ambulatory Visit: Payer: Self-pay

## 2023-12-30 DIAGNOSIS — I495 Sick sinus syndrome: Secondary | ICD-10-CM | POA: Diagnosis present

## 2023-12-30 DIAGNOSIS — I5032 Chronic diastolic (congestive) heart failure: Secondary | ICD-10-CM | POA: Diagnosis present

## 2023-12-30 DIAGNOSIS — E1122 Type 2 diabetes mellitus with diabetic chronic kidney disease: Secondary | ICD-10-CM | POA: Diagnosis present

## 2023-12-30 DIAGNOSIS — Z6833 Body mass index (BMI) 33.0-33.9, adult: Secondary | ICD-10-CM

## 2023-12-30 DIAGNOSIS — C3490 Malignant neoplasm of unspecified part of unspecified bronchus or lung: Secondary | ICD-10-CM | POA: Diagnosis present

## 2023-12-30 DIAGNOSIS — E1159 Type 2 diabetes mellitus with other circulatory complications: Secondary | ICD-10-CM | POA: Diagnosis present

## 2023-12-30 DIAGNOSIS — I5031 Acute diastolic (congestive) heart failure: Secondary | ICD-10-CM | POA: Diagnosis not present

## 2023-12-30 DIAGNOSIS — R590 Localized enlarged lymph nodes: Secondary | ICD-10-CM | POA: Diagnosis present

## 2023-12-30 DIAGNOSIS — I200 Unstable angina: Secondary | ICD-10-CM | POA: Diagnosis not present

## 2023-12-30 DIAGNOSIS — N200 Calculus of kidney: Secondary | ICD-10-CM | POA: Diagnosis not present

## 2023-12-30 DIAGNOSIS — D631 Anemia in chronic kidney disease: Secondary | ICD-10-CM | POA: Diagnosis not present

## 2023-12-30 DIAGNOSIS — I252 Old myocardial infarction: Secondary | ICD-10-CM

## 2023-12-30 DIAGNOSIS — I69351 Hemiplegia and hemiparesis following cerebral infarction affecting right dominant side: Secondary | ICD-10-CM

## 2023-12-30 DIAGNOSIS — J42 Unspecified chronic bronchitis: Secondary | ICD-10-CM

## 2023-12-30 DIAGNOSIS — Z7984 Long term (current) use of oral hypoglycemic drugs: Secondary | ICD-10-CM

## 2023-12-30 DIAGNOSIS — Z87891 Personal history of nicotine dependence: Secondary | ICD-10-CM

## 2023-12-30 DIAGNOSIS — E1169 Type 2 diabetes mellitus with other specified complication: Secondary | ICD-10-CM | POA: Diagnosis present

## 2023-12-30 DIAGNOSIS — R918 Other nonspecific abnormal finding of lung field: Secondary | ICD-10-CM | POA: Diagnosis not present

## 2023-12-30 DIAGNOSIS — I69391 Dysphagia following cerebral infarction: Secondary | ICD-10-CM | POA: Diagnosis not present

## 2023-12-30 DIAGNOSIS — I4891 Unspecified atrial fibrillation: Secondary | ICD-10-CM | POA: Diagnosis not present

## 2023-12-30 DIAGNOSIS — E1165 Type 2 diabetes mellitus with hyperglycemia: Secondary | ICD-10-CM | POA: Diagnosis not present

## 2023-12-30 DIAGNOSIS — I5033 Acute on chronic diastolic (congestive) heart failure: Secondary | ICD-10-CM | POA: Diagnosis not present

## 2023-12-30 DIAGNOSIS — F325 Major depressive disorder, single episode, in full remission: Secondary | ICD-10-CM | POA: Diagnosis not present

## 2023-12-30 DIAGNOSIS — I517 Cardiomegaly: Secondary | ICD-10-CM | POA: Diagnosis not present

## 2023-12-30 DIAGNOSIS — N183 Chronic kidney disease, stage 3 unspecified: Secondary | ICD-10-CM | POA: Diagnosis present

## 2023-12-30 DIAGNOSIS — I2584 Coronary atherosclerosis due to calcified coronary lesion: Secondary | ICD-10-CM | POA: Diagnosis present

## 2023-12-30 DIAGNOSIS — R0609 Other forms of dyspnea: Secondary | ICD-10-CM | POA: Diagnosis not present

## 2023-12-30 DIAGNOSIS — R079 Chest pain, unspecified: Secondary | ICD-10-CM | POA: Diagnosis not present

## 2023-12-30 DIAGNOSIS — I442 Atrioventricular block, complete: Secondary | ICD-10-CM | POA: Diagnosis not present

## 2023-12-30 DIAGNOSIS — I222 Subsequent non-ST elevation (NSTEMI) myocardial infarction: Secondary | ICD-10-CM | POA: Diagnosis not present

## 2023-12-30 DIAGNOSIS — I10 Essential (primary) hypertension: Secondary | ICD-10-CM | POA: Diagnosis not present

## 2023-12-30 DIAGNOSIS — J449 Chronic obstructive pulmonary disease, unspecified: Secondary | ICD-10-CM | POA: Diagnosis present

## 2023-12-30 DIAGNOSIS — J181 Lobar pneumonia, unspecified organism: Secondary | ICD-10-CM | POA: Diagnosis not present

## 2023-12-30 DIAGNOSIS — Z7982 Long term (current) use of aspirin: Secondary | ICD-10-CM

## 2023-12-30 DIAGNOSIS — Z79899 Other long term (current) drug therapy: Secondary | ICD-10-CM

## 2023-12-30 DIAGNOSIS — Z8249 Family history of ischemic heart disease and other diseases of the circulatory system: Secondary | ICD-10-CM

## 2023-12-30 DIAGNOSIS — I219 Acute myocardial infarction, unspecified: Secondary | ICD-10-CM | POA: Diagnosis present

## 2023-12-30 DIAGNOSIS — E1140 Type 2 diabetes mellitus with diabetic neuropathy, unspecified: Secondary | ICD-10-CM

## 2023-12-30 DIAGNOSIS — Z823 Family history of stroke: Secondary | ICD-10-CM

## 2023-12-30 DIAGNOSIS — I152 Hypertension secondary to endocrine disorders: Secondary | ICD-10-CM | POA: Diagnosis not present

## 2023-12-30 DIAGNOSIS — I25110 Atherosclerotic heart disease of native coronary artery with unstable angina pectoris: Principal | ICD-10-CM | POA: Diagnosis present

## 2023-12-30 DIAGNOSIS — I129 Hypertensive chronic kidney disease with stage 1 through stage 4 chronic kidney disease, or unspecified chronic kidney disease: Secondary | ICD-10-CM | POA: Diagnosis present

## 2023-12-30 DIAGNOSIS — N1832 Chronic kidney disease, stage 3b: Secondary | ICD-10-CM | POA: Diagnosis not present

## 2023-12-30 DIAGNOSIS — E785 Hyperlipidemia, unspecified: Secondary | ICD-10-CM

## 2023-12-30 DIAGNOSIS — C3432 Malignant neoplasm of lower lobe, left bronchus or lung: Principal | ICD-10-CM | POA: Diagnosis present

## 2023-12-30 DIAGNOSIS — Z87440 Personal history of urinary (tract) infections: Secondary | ICD-10-CM

## 2023-12-30 DIAGNOSIS — N179 Acute kidney failure, unspecified: Secondary | ICD-10-CM | POA: Diagnosis present

## 2023-12-30 DIAGNOSIS — N1831 Chronic kidney disease, stage 3a: Secondary | ICD-10-CM | POA: Diagnosis present

## 2023-12-30 DIAGNOSIS — E7849 Other hyperlipidemia: Secondary | ICD-10-CM | POA: Diagnosis present

## 2023-12-30 DIAGNOSIS — J441 Chronic obstructive pulmonary disease with (acute) exacerbation: Secondary | ICD-10-CM | POA: Diagnosis present

## 2023-12-30 DIAGNOSIS — E66811 Obesity, class 1: Secondary | ICD-10-CM | POA: Diagnosis present

## 2023-12-30 DIAGNOSIS — J90 Pleural effusion, not elsewhere classified: Secondary | ICD-10-CM | POA: Diagnosis not present

## 2023-12-30 DIAGNOSIS — Z794 Long term (current) use of insulin: Secondary | ICD-10-CM

## 2023-12-30 DIAGNOSIS — I2723 Pulmonary hypertension due to lung diseases and hypoxia: Secondary | ICD-10-CM | POA: Diagnosis not present

## 2023-12-30 DIAGNOSIS — Z91040 Latex allergy status: Secondary | ICD-10-CM

## 2023-12-30 DIAGNOSIS — I700 Atherosclerosis of aorta: Secondary | ICD-10-CM | POA: Diagnosis not present

## 2023-12-30 DIAGNOSIS — I2510 Atherosclerotic heart disease of native coronary artery without angina pectoris: Secondary | ICD-10-CM | POA: Diagnosis present

## 2023-12-30 DIAGNOSIS — I499 Cardiac arrhythmia, unspecified: Secondary | ICD-10-CM

## 2023-12-30 LAB — TROPONIN I (HIGH SENSITIVITY)
Troponin I (High Sensitivity): 132 ng/L (ref <18)
Troponin I (High Sensitivity): 140 ng/L (ref <18)

## 2023-12-30 LAB — BASIC METABOLIC PANEL WITH GFR
Anion gap: 12 (ref 5–15)
BUN: 21 mg/dL (ref 8–23)
CO2: 21 mmol/L — ABNORMAL LOW (ref 22–32)
Calcium: 9.5 mg/dL (ref 8.9–10.3)
Chloride: 107 mmol/L (ref 98–111)
Creatinine, Ser: 2.12 mg/dL — ABNORMAL HIGH (ref 0.44–1.00)
GFR, Estimated: 26 mL/min — ABNORMAL LOW (ref >60)
Glucose, Bld: 160 mg/dL — ABNORMAL HIGH (ref 70–99)
Potassium: 4.3 mmol/L (ref 3.5–5.1)
Sodium: 140 mmol/L (ref 135–145)

## 2023-12-30 LAB — CBC
HCT: 39.1 % (ref 36.0–46.0)
Hemoglobin: 11.8 g/dL — ABNORMAL LOW (ref 12.0–15.0)
MCH: 27.3 pg (ref 26.0–34.0)
MCHC: 30.2 g/dL (ref 30.0–36.0)
MCV: 90.5 fL (ref 80.0–100.0)
Platelets: 315 10*3/uL (ref 150–400)
RBC: 4.32 MIL/uL (ref 3.87–5.11)
RDW: 16.1 % — ABNORMAL HIGH (ref 11.5–15.5)
WBC: 6.8 10*3/uL (ref 4.0–10.5)
nRBC: 0 % (ref 0.0–0.2)

## 2023-12-30 LAB — URINALYSIS, ROUTINE W REFLEX MICROSCOPIC
Bilirubin Urine: NEGATIVE
Glucose, UA: NEGATIVE mg/dL
Hgb urine dipstick: NEGATIVE
Ketones, ur: NEGATIVE mg/dL
Leukocytes,Ua: NEGATIVE
Nitrite: NEGATIVE
Protein, ur: NEGATIVE mg/dL
Specific Gravity, Urine: 1.006 (ref 1.005–1.030)
pH: 6 (ref 5.0–8.0)

## 2023-12-30 LAB — GLUCOSE, CAPILLARY: Glucose-Capillary: 164 mg/dL — ABNORMAL HIGH (ref 70–99)

## 2023-12-30 LAB — BRAIN NATRIURETIC PEPTIDE: B Natriuretic Peptide: 691.1 pg/mL — ABNORMAL HIGH (ref 0.0–100.0)

## 2023-12-30 LAB — RESP PANEL BY RT-PCR (RSV, FLU A&B, COVID)  RVPGX2
Influenza A by PCR: NEGATIVE
Influenza B by PCR: NEGATIVE
Resp Syncytial Virus by PCR: NEGATIVE
SARS Coronavirus 2 by RT PCR: NEGATIVE

## 2023-12-30 LAB — MRSA NEXT GEN BY PCR, NASAL: MRSA by PCR Next Gen: NOT DETECTED

## 2023-12-30 MED ORDER — ASPIRIN 300 MG RE SUPP: 300.0000 mg | RECTAL | Status: AC

## 2023-12-30 MED ORDER — FUROSEMIDE 10 MG/ML IJ SOLN
40.0000 mg | Freq: Once | INTRAMUSCULAR | Status: AC
  Administered 2023-12-30: 40 mg via INTRAVENOUS
  Filled 2023-12-30: qty 4

## 2023-12-30 MED ORDER — ASPIRIN 81 MG PO TBEC
81.0000 mg | DELAYED_RELEASE_TABLET | Freq: Every day | ORAL | Status: DC
  Administered 2023-12-31 – 2024-01-04 (×4): 81 mg via ORAL
  Filled 2023-12-30 (×4): qty 1

## 2023-12-30 MED ORDER — ASPIRIN 81 MG PO CHEW
324.0000 mg | CHEWABLE_TABLET | ORAL | Status: AC
  Administered 2023-12-30: 324 mg via ORAL
  Filled 2023-12-30: qty 4

## 2023-12-30 MED ORDER — NITROGLYCERIN 0.4 MG SL SUBL: 0.4000 mg | SUBLINGUAL_TABLET | SUBLINGUAL | Status: DC | PRN

## 2023-12-30 MED ORDER — ROSUVASTATIN CALCIUM 20 MG PO TABS
40.0000 mg | ORAL_TABLET | Freq: Every day | ORAL | Status: DC
  Administered 2023-12-30 – 2024-01-03 (×5): 40 mg via ORAL
  Filled 2023-12-30 (×5): qty 2

## 2023-12-30 MED ORDER — INSULIN GLARGINE-YFGN 100 UNIT/ML ~~LOC~~ SOLN
5.0000 [IU] | Freq: Two times a day (BID) | SUBCUTANEOUS | Status: DC
  Administered 2023-12-30 – 2024-01-04 (×10): 5 [IU] via SUBCUTANEOUS
  Filled 2023-12-30 (×13): qty 0.05

## 2023-12-30 MED ORDER — CITALOPRAM HYDROBROMIDE 20 MG PO TABS
10.0000 mg | ORAL_TABLET | Freq: Every day | ORAL | Status: DC
  Administered 2023-12-31 – 2024-01-04 (×4): 10 mg via ORAL
  Filled 2023-12-30 (×4): qty 1

## 2023-12-30 MED ORDER — ALBUTEROL SULFATE (2.5 MG/3ML) 0.083% IN NEBU
2.5000 mg | INHALATION_SOLUTION | Freq: Four times a day (QID) | RESPIRATORY_TRACT | Status: DC | PRN
  Administered 2023-12-31 – 2024-01-03 (×2): 2.5 mg via RESPIRATORY_TRACT
  Filled 2023-12-30 (×2): qty 3

## 2023-12-30 MED ORDER — HYDRALAZINE HCL 50 MG PO TABS
100.0000 mg | ORAL_TABLET | Freq: Two times a day (BID) | ORAL | Status: DC
  Administered 2023-12-30 – 2024-01-04 (×9): 100 mg via ORAL
  Filled 2023-12-30 (×9): qty 2
  Filled 2023-12-30: qty 1

## 2023-12-30 MED ORDER — VANCOMYCIN VARIABLE DOSE PER UNSTABLE RENAL FUNCTION (PHARMACIST DOSING): Status: DC

## 2023-12-30 MED ORDER — HEPARIN SODIUM (PORCINE) 5000 UNIT/ML IJ SOLN
5000.0000 [IU] | Freq: Three times a day (TID) | INTRAMUSCULAR | Status: DC
  Administered 2023-12-30 – 2024-01-04 (×14): 5000 [IU] via SUBCUTANEOUS
  Filled 2023-12-30 (×14): qty 1

## 2023-12-30 MED ORDER — MELATONIN 5 MG PO TABS
10.0000 mg | ORAL_TABLET | Freq: Every day | ORAL | Status: DC
  Administered 2023-12-30 – 2024-01-03 (×5): 10 mg via ORAL
  Filled 2023-12-30 (×5): qty 2

## 2023-12-30 MED ORDER — VANCOMYCIN HCL 1750 MG/350ML IV SOLN
1750.0000 mg | Freq: Once | INTRAVENOUS | Status: DC
  Filled 2023-12-30: qty 350

## 2023-12-30 MED ORDER — INSULIN ASPART 100 UNIT/ML IJ SOLN
0.0000 [IU] | Freq: Three times a day (TID) | INTRAMUSCULAR | Status: DC
  Administered 2023-12-31: 3 [IU] via SUBCUTANEOUS
  Administered 2023-12-31 – 2024-01-01 (×3): 2 [IU] via SUBCUTANEOUS
  Administered 2024-01-01: 1 [IU] via SUBCUTANEOUS
  Administered 2024-01-02: 2 [IU] via SUBCUTANEOUS
  Administered 2024-01-02: 3 [IU] via SUBCUTANEOUS
  Administered 2024-01-03 – 2024-01-04 (×3): 2 [IU] via SUBCUTANEOUS

## 2023-12-30 MED ORDER — ACETAMINOPHEN 325 MG PO TABS: 650.0000 mg | ORAL_TABLET | ORAL | Status: DC | PRN

## 2023-12-30 MED ORDER — ALBUTEROL SULFATE (2.5 MG/3ML) 0.083% IN NEBU
2.5000 mg | INHALATION_SOLUTION | Freq: Once | RESPIRATORY_TRACT | Status: AC
  Administered 2023-12-30: 2.5 mg via RESPIRATORY_TRACT
  Filled 2023-12-30: qty 3

## 2023-12-30 MED ORDER — ONDANSETRON HCL 4 MG/2ML IJ SOLN
4.0000 mg | Freq: Four times a day (QID) | INTRAMUSCULAR | Status: DC | PRN
  Administered 2023-12-31 – 2024-01-02 (×2): 4 mg via INTRAVENOUS
  Filled 2023-12-30 (×2): qty 2

## 2023-12-30 MED ORDER — BUDESON-GLYCOPYRROL-FORMOTEROL 160-9-4.8 MCG/ACT IN AERO
2.0000 | INHALATION_SPRAY | Freq: Two times a day (BID) | RESPIRATORY_TRACT | Status: DC
  Administered 2023-12-30 – 2024-01-04 (×10): 2 via RESPIRATORY_TRACT
  Filled 2023-12-30: qty 5.9

## 2023-12-30 NOTE — ED Provider Notes (Signed)
 [place] [facility]MENT AT [facility] Provider Note   CSN: [id number] Arrival date & time: 12/30/23  1011     History  Chief Complaint  Patient presents with   Chest Pain    Kelly Gibson is a 63 y.o. female.  HPI 63 year old female presents today complaining of chest pain coming and going since catheterization 2 days ago.  Review of cardiac catheterization notes complex situation with multiple comorbidities including prior stroke, hemiparesis, diabetes, recent hospitalization for urosepsis with staghorn calculus, and NSTEMI.  Cardiac cath report reveals 3 distinct occlusions of mid LAD, proximal OM2, mid RCA with faint collateralization.  In addition she has at least isorhythmic A-V dissociation if not complete AV block on EKG.  This is suspected to also be ischemic in etiology. Patient describes the pain in her chest as coming and going currently she is not having chest pain.  She also describes nausea and vomiting.  She called cardiology this morning and was told to come to the ED    Home Medications Prior to Admission medications   Medication Sig Start Date End Date Taking? Authorizing Provider  albuterol  (VENTOLIN  HFA) 108 (90 Base) MCG/ACT inhaler Inhale 2 puffs into the lungs every 6 (six) hours as needed for wheezing or shortness of breath. 12/14/23   Etter Hermann., MD  amLODipine  (NORVASC ) 5 MG tablet Take 1 tablet (5 mg total) by mouth daily. Patient not taking: Reported on 12/23/2023 05/12/23   Annella Kief, NP  aspirin  EC 81 MG tablet Take 1 tablet (81 mg total) by mouth daily. Swallow whole. 12/15/23   Etter Hermann., MD  budesonide -glycopyrrolate -formoterol  (BREZTRI  AEROSPHERE) 160-9-4.8 MCG/ACT AERO inhaler Inhale 2 puffs into the lungs 2 (two) times daily. 12/14/23   Etter Hermann., MD  citalopram  (CELEXA ) 10 MG tablet TAKE 1 TABLET BY MOUTH DAILY 06/28/23   Early, Sara E, NP  Continuous Glucose Sensor (DEXCOM G7 SENSOR) MISC  Take 1 Device by mouth See admin instructions. Change sensor every 10 days    [provider]  Doxylamine Succinate, Sleep, (UNISOM PO) Take 2 each by mouth at bedtime. Gel capsules    [provider]  furosemide  (LASIX ) 20 MG tablet Take 1 tablet (20 mg total) by mouth daily as needed. Patient taking differently: Take 20 mg by mouth daily as needed for edema or fluid. 10/05/22   Early, Sara E, NP  HUMALOG  KWIKPEN 100 UNIT/ML KwikPen INJECT 3 TO 5 UNITS INTO THE SKIN DAILY BEFORE LUNCH Patient taking differently: Inject 2-10 Units into the skin with breakfast, with lunch, and with evening meal. 11/29/23   Early, Adriane Albe, NP  hydrALAZINE  (APRESOLINE ) 100 MG tablet Take 1 tablet (100 mg total) by mouth 2 (two) times daily. 12/28/23   Leala Prince, PA-C  insulin  degludec (TRESIBA  FLEXTOUCH) 100 UNIT/ML FlexTouch Pen Inject 10 Units into the skin 2 (two) times daily. Adjust by 2 units every 3 days as needed for blood sugars higher than 150 in the morning fasting. Patient taking differently: Inject 10-22 Units into the skin 2 (two) times daily. Take 22 units in the morning and take 10 units at night. Adjust by 2 units every 3 days as needed for blood sugars higher than 150 in the morning fasting. 10/05/22   Early, Sara E, NP  Melatonin 5 MG CHEW Chew 10 mg by mouth at bedtime.    [provider]  Multiple Vitamins-Minerals (MULTIVITAMIN GUMMIES WOMENS) CHEW Chew 2 each by mouth  in the morning.    [provider]  olmesartan (BENICAR) 40 MG tablet Take 40 mg by mouth in the morning. Patient not taking: Reported on 12/23/2023 05/12/23   [provider]  rosuvastatin  (CRESTOR ) 40 MG tablet TAKE 1 TABLET BY MOUTH DAILY Patient taking differently: Take 40 mg by mouth at bedtime. 04/08/23   Early, Sara E, NP  tiZANidine  (ZANAFLEX ) 2 MG tablet TAKE 1-2 TABLETS UP TO TWICE A DAY  AS NEEDED FOR MUSCLE SPASMS Patient taking differently: Take 2 mg by mouth at bedtime. 05/11/23    Early, Sara E, NP  TRADJENTA  5 MG TABS tablet Take 5 mg by mouth daily. Patient not taking: Reported on 12/11/2023    [provider]  UNABLE TO FIND Take 1 tablet by mouth with breakfast, with lunch, and with evening meal. GlucoGold -Berberine, Concentrated Cinnamon, Chromium, Banaba Leaf Extract    [provider]      Allergies    Latex and Niacin    Review of Systems   Review of Systems  Physical Exam Updated Vital Signs BP (!) 125/58   Pulse (!) 49   Temp 97.6 F (36.4 C)   Resp (!) 24   LMP 12/10/2012 (Exact Date)   SpO2 98%  Physical Exam Vitals reviewed.  Constitutional:      General: She is not in acute distress.    Appearance: She is normal weight. She is ill-appearing.  HENT:     Head: Normocephalic and atraumatic.  Eyes:     Pupils: Pupils are equal, round, and reactive to light.  Cardiovascular:     Rate and Rhythm: Normal rate.     Heart sounds: Normal heart sounds.  Pulmonary:     Effort: Pulmonary effort is normal.     Breath sounds: Normal breath sounds.  Abdominal:     General: Bowel sounds are normal.     Palpations: Abdomen is soft.  Musculoskeletal:        General: Normal range of motion.     Cervical back: Normal range of motion.  Skin:    General: Skin is warm and dry.     Capillary Refill: Capillary refill takes less than 2 seconds.  Neurological:     General: No focal deficit present.     Mental Status: She is alert.     ED Results / Procedures / Treatments   Labs (all labs ordered are listed, but only abnormal results are displayed) Labs Reviewed  BASIC METABOLIC PANEL WITH GFR - Abnormal; Notable for the following components:      Result Value   CO2 21 (*)    Glucose, Bld 160 (*)    Creatinine, Ser 2.12 (*)    GFR, Estimated 26 (*)    All other components within normal limits  CBC - Abnormal; Notable for the following components:   Hemoglobin 11.8 (*)    RDW 16.1 (*)    All other components within normal  limits  TROPONIN I (HIGH SENSITIVITY) - Abnormal; Notable for the following components:   Troponin I (High Sensitivity) 132 (*)    All other components within normal limits  URINALYSIS, ROUTINE W REFLEX MICROSCOPIC  BRAIN NATRIURETIC PEPTIDE  TROPONIN I (HIGH SENSITIVITY)    EKG EKG Interpretation Date/Time:  Thursday December 30 2023 10:20:42 EDT Ventricular Rate:  67 PR Interval:    QRS Duration:  82 QT Interval:  458 QTC Calculation: 483 R Axis:   -27  Text Interpretation: Undetermined rhythm Inferior infarct ,  age undetermined Anterior infarct , age undetermined Abnormal ECG When compared with ECG of 28-Dec-2023 11:32, PREVIOUS ECG IS PRESENT twave inversion is present inII and III Confirmed by Auston Blush [phone]) on 12/30/2023 11:13:38 AM  Radiology DG Chest 2 View Result Date: 12/30/2023 CLINICAL DATA:  cp EXAM: CHEST - 2 VIEW COMPARISON:  Dec 12, 2023 FINDINGS: Diffuse interstitial opacities throughout the lungs. Patchy airspace opacities in the right suprahilar upper lobe and the left lung, mostly in the left lower lobe. No pleural effusion or pneumothorax. Mild cardiomegaly. Aortic atherosclerosis. No acute fracture or destructive lesion. Multilevel thoracic osteophytosis. IMPRESSION: Cardiomegaly with findings of either asymmetric pulmonary edema or multifocal pneumonia. Given the persistence of the masslike consolidation in the left lower lobe on the prior CT of the abdomen and pelvis, a follow-up chest CT with IV contrast is recommended. Electronically Signed   By: Rance Burrows M.D.   On: 12/30/2023 11:26    Procedures .Critical Care  Performed by: Auston Blush, MD Authorized by: Auston Blush, MD   Critical care provider statement:    Critical care time (minutes):  30   Critical care end time:  12/30/2023 1:14 PM   Critical care time was exclusive of:  Separately billable procedures and treating other patients and teaching time   Critical care was necessary to treat or  prevent imminent or life-threatening deterioration of the following conditions:  Cardiac failure   Critical care was time spent personally by me on the following activities:  Development of treatment plan with patient or surrogate, discussions with consultants, evaluation of patient's response to treatment, examination of patient, ordering and review of laboratory studies, ordering and review of radiographic studies, ordering and performing treatments and interventions, pulse oximetry, re-evaluation of patient's condition and review of old charts     Medications Ordered in ED Medications - No data to display  ED Course/ Medical Decision Making/ A&P Clinical Course as of 12/30/23 1314  Thu Dec 30, 2023  1146 Basic metabolic panel reviewed interpreted significant for creatinine increased to 2.12 from first prior of 1.492 weeks ago CBC is significant for mild anemia with hemoglobin of 11.8 [DR]  1150 Chest x-Virl Coble reviewed and interpreted with multiple areas of rounded densities, radiologist interpretation with asymmetric pulmonary edema or multifocal pneumonia [DR]    Clinical Course User Index [DR] Auston Blush, MD                                 Medical Decision Making Amount and/or Complexity of Data Reviewed Labs: ordered. Radiology: ordered.   1 unstable angina patient with EKG with T wave inversion in 1 and 2 that is new from first prior.  Currently she is pain-free.  Awaiting troponin 1:14 PM Troponin reviewed and is elevated at 132.  On my evaluation, patient is not having active chest pain. Her care was discussed with Leala Prince, PA-C on-call for cardiology. He advises that patient should be admitted to hospitalist team 2 chest x-Laurelle Skiver with multiple densities which could represent infection versus edema Will add BNP.  Patient records with noted concern for possible aspiration due to prior stroke.  However husband states that she had a recent swallow study that she passed  without difficulty. 3 known renal calculus patient not currently complaining of pain but has had nausea and vomiting we will check urine 4- unclear rhythm on EKG current heart rate is 51 and patient appears to be hemodynamically  stable will discuss with cardiology Care discussed with Dr. Rufina Cough, on-call for hospitalist who will see for admission       Final Clinical Impression(s) / ED Diagnoses Final diagnoses:  Unstable angina [facility])  Cardiac arrhythmia, unspecified cardiac arrhythmia type    Rx / DC Orders ED Discharge Orders     None         Auston Blush, MD 12/30/23 1314

## 2023-12-30 NOTE — Consult Note (Addendum)
 CONSULTATION NOTE   Patient Name: Kelly Gibson Date of Encounter: 12/30/2023 Cardiologist: Hazle Lites, MD Electrophysiologist: None Advanced Heart Failure: None   Chief Complaint   Chest pain, nausea, vomiting  Patient Profile   63 yo female with recent admission for NSTEMI, E. coli bacteremia and intermittent heart block, mild to have an obstructing kidney stone.  Troponin is markedly elevated into the 15,000's.  She underwent outpatient nuclear stress testing for cardiac risk assessment and had significant reversible anterior ischemia.  She had outpatient cardiac catheterization but no intervention although bypass grafting is considered.  She now presents back 2 days later with chest pain, nausea and vomiting.  HPI   Kelly Gibson is a 63 y.o. female who is being seen today for the evaluation of chest pain, nausea and vomiting at the request of Dr. Rufina Cough.  This is a 62 year old female that I saw remotely who had recently been up in [place] .  She was hospitalized there for STEMI, E. coli bacteremia and intermittent complete heart block.  She was admitted for respiratory failure requiring BiPAP and treated for COPD exacerbation.  Ultimately she requested to transfer to [facility] on May 17 for additional care.  Cardiology saw her here for significantly elevated troponins greater than 15,000 which were felt to be due to type II MI in the setting of acute illness.  Outpatient stress testing was recommended.  Ultimately she improved enough to be discharged on May 20.  She was subsequently seen in follow-up on May 23.  A Myoview  stress test was ordered which showed a large anterior perfusion defect.  Based on this cardiac catheterization was recommended.  We coordinated this with Dr. Dulcy Gibney who is the urologis that he is planning on trying to remove her ureteral stone.  We determined that that procedure could be postponed until her coronary issues were resolved.  She had  outpatient cardiac catheterization on 12/28/2023 indicating complex coronary disease with mid LAD occlusion and distal vessel collateralization, 70% proximal OM1 stenosis and OM 2 occlusion, and mid RCA occlusion with collaterals.  She was noted during the case to have episodes of A-V dissociation and was seen by cardiac electrophysiology who felt that she was likely having some degree of wandering atrial pacemaker.  No acute need for pacemaker was noted.  She subsequently was discharged.  She says over the past 2 days while at home she has become progressively worse including having worsening shortness of breath, chest pain and nausea and vomiting.  Initial labs showed troponin of 132, down significantly from 15,142 on Dec 11, 2021.  She was also incidentally noted to have a very high LP(a) at 422.6 nmol/L.  Initial labs now showing an elevated creatinine of 2.12, which was 1.49 on May 20.  Chest x-ray shows cardiomegaly with either edema or possible multifocal pneumonia.  There is a masslike structure in the left base of the lung which was previously seen on CT imaging and a dedicated CT scan of the chest was recommended with contrast however given her low GFR is unlikely to be obtained anytime soon.  EKG now shows a wandering atrial pacemaker at 67.  PMHx   Past Medical History:  Diagnosis Date   Bradycardia    SR/SB with up to 3.5 second pause (asymptomatic) on ~ 10/2017 event monitor; saw EP Dr. Nunzio Belch: avoid AV nodal blocking agents   Chronic kidney disease    COPD (chronic obstructive pulmonary disease) (HCC)    Depression  Depression with anxiety 08/29/2021   Diabetes mellitus    Dysarthria, post-stroke    Dysrhythmia    Ganglion cyst 03/09/2012   History of kidney stones    Hyperlipidemia    Hypertension    Hypokalemia    Myocardial infarction [facility])    NSTEMI   Obesity, unspecified 12/17/2011   Pneumonia    Pneumonia of left lower lobe due to infectious organism 12/12/2023   PONV  (postoperative nausea and vomiting)    Skin lesion of face 11/17/2022   Stroke [facility])    2019   right side hemiparesis    Past Surgical History:  Procedure Laterality Date   CESAREAN SECTION     x 3   LEFT HEART CATH AND CORONARY ANGIOGRAPHY N/A 12/28/2023   Procedure: LEFT HEART CATH AND CORONARY ANGIOGRAPHY;  Surgeon: Cody Das, MD;  Location: [facility] INVASIVE CV LAB;  Service: Cardiovascular;  Laterality: N/A;   ORIF WRIST FRACTURE Right 05/04/2018   Procedure: OPEN REDUCTION INTERNAL FIXATION (ORIF) RIGHT  WRIST FRACTURE;  Surgeon: Micheline Ahr, MD;  Location: [facility] OR;  Service: Orthopedics;  Laterality: Right;   TUMOR REMOVAL     left shoulder    FAMHx   Family History  Problem Relation Age of Onset   Diabetes Mother    Stroke Mother    Heart disease Father    Cancer Father    Stroke Father     SOCHx    reports that she has quit smoking. Her smoking use included cigarettes. She has a 20 pack-year smoking history. She has never used smokeless tobacco. She reports that she does not drink alcohol and does not use drugs.  Outpatient Medications   No current facility-administered medications on file prior to encounter.   Current Outpatient Medications on File Prior to Encounter  Medication Sig Dispense Refill   albuterol  (VENTOLIN  HFA) 108 (90 Base) MCG/ACT inhaler Inhale 2 puffs into the lungs every 6 (six) hours as needed for wheezing or shortness of breath. 18 g 2   aspirin  EC 81 MG tablet Take 1 tablet (81 mg total) by mouth daily. Swallow whole. 30 tablet 12   budesonide -glycopyrrolate -formoterol  (BREZTRI  AEROSPHERE) 160-9-4.8 MCG/ACT AERO inhaler Inhale 2 puffs into the lungs 2 (two) times daily. 10.7 g 1   citalopram  (CELEXA ) 10 MG tablet TAKE 1 TABLET BY MOUTH DAILY 90 tablet 1   Continuous Glucose Sensor (DEXCOM G7 SENSOR) MISC Take 1 Device by mouth See admin instructions. Change sensor every 10 days     Doxylamine Succinate, Sleep, (UNISOM PO) Take 2 each by  mouth at bedtime. Gel capsules     furosemide  (LASIX ) 20 MG tablet Take 1 tablet (20 mg total) by mouth daily as needed. (Patient taking differently: Take 20 mg by mouth daily as needed for edema or fluid.) 90 tablet 1   HUMALOG  KWIKPEN 100 UNIT/ML KwikPen INJECT 3 TO 5 UNITS INTO THE SKIN DAILY BEFORE LUNCH (Patient taking differently: Inject 2-10 Units into the skin with breakfast, with lunch, and with evening meal.) 6 mL 3   hydrALAZINE  (APRESOLINE ) 100 MG tablet Take 1 tablet (100 mg total) by mouth 2 (two) times daily. 60 tablet 1   insulin  degludec (TRESIBA  FLEXTOUCH) 100 UNIT/ML FlexTouch Pen Inject 10 Units into the skin 2 (two) times daily. Adjust by 2 units every 3 days as needed for blood sugars higher than 150 in the morning fasting. (Patient taking differently: Inject 10-22 Units into the skin 2 (two) times daily. Take 22 units  in the morning and take 10 units at night. Adjust by 2 units every 3 days as needed for blood sugars higher than 150 in the morning fasting.) 9 mL 11   Melatonin 5 MG CHEW Chew 10 mg by mouth at bedtime.     Multiple Vitamins-Minerals (MULTIVITAMIN GUMMIES WOMENS) CHEW Chew 2 each by mouth in the morning.     rosuvastatin  (CRESTOR ) 40 MG tablet TAKE 1 TABLET BY MOUTH DAILY (Patient taking differently: Take 40 mg by mouth at bedtime.) 90 tablet 3   tiZANidine  (ZANAFLEX ) 2 MG tablet TAKE 1-2 TABLETS UP TO TWICE A DAY  AS NEEDED FOR MUSCLE SPASMS (Patient taking differently: Take 2 mg by mouth at bedtime.) 180 tablet 1   UNABLE TO FIND Take 1 tablet by mouth with breakfast, with lunch, and with evening meal. GlucoGold -Berberine, Concentrated Cinnamon, Chromium, Banaba Leaf Extract     [Paused] amLODipine  (NORVASC ) 5 MG tablet Take 1 tablet (5 mg total) by mouth daily. (Patient not taking: Reported on 12/23/2023) 90 tablet 3   [Paused] olmesartan (BENICAR) 40 MG tablet Take 40 mg by mouth in the morning. (Patient not taking: Reported on 12/23/2023)     [Paused] TRADJENTA  5  MG TABS tablet Take 5 mg by mouth daily. (Patient not taking: Reported on 12/11/2023)      Inpatient Medications    Scheduled Meds:  aspirin   324 mg Oral NOW   Or   aspirin   300 mg Rectal NOW   [START ON 12/31/2023] aspirin  EC  81 mg Oral Daily   budesonide -glycopyrrolate -formoterol   2 puff Inhalation BID   [START ON 12/31/2023] citalopram   10 mg Oral Daily   heparin   5,000 Units Subcutaneous Q8H   hydrALAZINE   100 mg Oral BID   Melatonin  10 mg Oral QHS   rosuvastatin   40 mg Oral QHS    Continuous Infusions:   PRN Meds: acetaminophen , albuterol , nitroGLYCERIN , ondansetron  (ZOFRAN ) IV   ALLERGIES   Allergies  Allergen Reactions   Latex Itching and Rash   Niacin Nausea And Vomiting    ROS   Pertinent items noted in HPI and remainder of comprehensive ROS otherwise negative.  Vitals   Vitals:   12/30/23 1130 12/30/23 1145 12/30/23 1200 12/30/23 1215  BP: (!) 148/58 137/83 (!) 151/60 (!) 125/58  Pulse: (!) 50 (!) 51 (!) 49 (!) 49  Resp: (!) 31 19 (!) 28 (!) 24  Temp:      SpO2: 99% 99% 99% 98%   No intake or output data in the 24 hours ending 12/30/23 1348 There were no vitals filed for this visit.  Physical Exam   General appearance: alert and no distress Neck: no carotid bruit, no JVD, and thyroid not enlarged, symmetric, no tenderness/mass/nodules Lungs: diminished breath sounds LLL Heart: irregularly irregular rhythm Abdomen: soft, non-tender; bowel sounds normal; no masses,  no organomegaly Extremities: extremities normal, atraumatic, no cyanosis or edema Pulses: 2+ and symmetric Skin: Skin color, texture, turgor normal. No rashes or lesions Neurologic: Grossly normal Psych: Pleasant, quiet  Labs   Results for orders placed or performed during the hospital encounter of 12/30/23 (from the past 48 hours)  Basic metabolic panel     Status: Abnormal   Collection Time: 12/30/23 10:36 AM  Result Value Ref Range   Sodium 140 135 - 145 mmol/L   Potassium 4.3  3.5 - 5.1 mmol/L   Chloride 107 98 - 111 mmol/L   CO2 21 (L) 22 - 32 mmol/L   Glucose, Bld  160 (H) 70 - 99 mg/dL    Comment: Glucose reference range applies only to samples taken after fasting for at least 8 hours.   BUN 21 8 - 23 mg/dL   Creatinine, Ser 2.53 (H) 0.44 - 1.00 mg/dL   Calcium  9.5 8.9 - 10.3 mg/dL   GFR, Estimated 26 (L) >60 mL/min    Comment: (NOTE) Calculated using the CKD-EPI Creatinine Equation (2021)    Anion gap 12 5 - 15    Comment: Performed at [facility], [address] [place], [place] [address]  CBC     Status: Abnormal   Collection Time: 12/30/23 10:36 AM  Result Value Ref Range   WBC 6.8 4.0 - 10.5 K/uL   RBC 4.32 3.87 - 5.11 MIL/uL   Hemoglobin 11.8 (L) 12.0 - 15.0 g/dL   HCT 34.7 42.5 - 95.6 %   MCV 90.5 80.0 - 100.0 fL   MCH 27.3 26.0 - 34.0 pg   MCHC 30.2 30.0 - 36.0 g/dL   RDW 38.7 (H) 56.4 - 33.2 %   Platelets 315 150 - 400 K/uL   nRBC 0.0 0.0 - 0.2 %    Comment: Performed at [facility], [address], [place], [place] [address]  Troponin I (High Sensitivity)     Status: Abnormal   Collection Time: 12/30/23 10:36 AM  Result Value Ref Range   Troponin I (High Sensitivity) 132 (HH) <18 ng/L    Comment: CRITICAL RESULT CALLED TO, READ BACK BY AND VERIFIED WITH CHAPLIN,C RN 1151 12/30/23 AMIREHSANIF (NOTE) Elevated high sensitivity troponin I (hsTnI) values and significant  changes across serial measurements may suggest ACS but many other  chronic and acute conditions are known to elevate hsTnI results.  Refer to the "Links" section for chest pain algorithms and additional  guidance. Performed at [facility], [address], [place], [place] [address]     ECG   Wandering atrial pacemaker- Personally Reviewed  Telemetry   Sinus arrhythmia, likely wandering atrial pacemaker- Personally Reviewed  Radiology   DG Chest 2 View Result Date: 12/30/2023 CLINICAL DATA:  cp EXAM: CHEST - 2 VIEW COMPARISON:  Dec 12, 2023  FINDINGS: Diffuse interstitial opacities throughout the lungs. Patchy airspace opacities in the right suprahilar upper lobe and the left lung, mostly in the left lower lobe. No pleural effusion or pneumothorax. Mild cardiomegaly. Aortic atherosclerosis. No acute fracture or destructive lesion. Multilevel thoracic osteophytosis. IMPRESSION: Cardiomegaly with findings of either asymmetric pulmonary edema or multifocal pneumonia. Given the persistence of the masslike consolidation in the left lower lobe on the prior CT of the abdomen and pelvis, a follow-up chest CT with IV contrast is recommended. Electronically Signed   By: Rance Burrows M.D.   On: 12/30/2023 11:26    Cardiac Studies   N/A  Impression   Principal Problem:   Unstable angina (HCC) Active Problems:   Type 2 diabetes mellitus with diabetic neuropathy, with long-term current use of insulin  (HCC)   COPD (chronic obstructive pulmonary disease) (HCC)   Hemiparesis affecting right side as late effect of cerebrovascular accident (CVA) (HCC)   Dysphagia, post-stroke   Chronic kidney disease (CKD), stage III (moderate) (HCC)   Hyperlipidemia associated with type 2 diabetes mellitus (HCC)   Hypertension associated with diabetes (HCC)   Major depression in remission (HCC)   Heart block AV complete (HCC)   Coronary artery calcification   Sick sinus syndrome [facility])   Recommendation   Kelly Gibson presents  with progressive shortness of breath and chest discomfort although she has had significantly worsening renal function.  I am concerned about her obstructive calculus and whether or not there could be urinary infection or recurrent E. coli bacteremia.  Will defer to hospital medicine for workup of this.  Troponin actually is lower than it had been several weeks ago.  I would like to check a delta troponin.  At this point she is not a candidate for PCI and we suspect she might ultimately benefit from CABG although her case will be discussed  tomorrow morning in the interdisciplinary cath conference.  Nonetheless she will have to recover from her other ailments before she could be considered a candidate for that. She may also have a pneumonia - recommendations for dedicated chest CT made by radiology. Will hold off on heparin  unless her troponins rise.   Thanks for the consultation. Cardiology will follow with you. May need repeat urology evaluation - followed by Dr. Dulcy Gibney.  Time Spent Directly with Patient:  I have spent a total of 65 minutes with the patient reviewing hospital notes, telemetry, EKGs, labs and examining the patient as well as establishing an assessment and plan that was discussed personally with the patient.  > 50% of time was spent in direct patient care.  Length of Stay:  LOS: 0 days   Hazle Lites, MD, [facility], FNLA, FACP  [place]  [facility]  Medical Director of the Advanced Lipid Disorders &  Cardiovascular Risk Reduction Clinic Diplomate of the American Board of Clinical Lipidology Attending Cardiologist  Direct Dial: [phone]  Fax: [phone]  Website:  [email or web address]   Aviva Lemmings Adler Chartrand 12/30/2023, 1:48 PM

## 2023-12-30 NOTE — ED Triage Notes (Signed)
 Pt states that she has been having CP and sob since her cardiac cath 2 days ago.  Pt states that she has been sweaty also.

## 2023-12-30 NOTE — Telephone Encounter (Signed)
   Pt c/o of Chest Pain: STAT if active CP, including tightness, pressure, jaw pain, radiating pain to shoulder/upper arm/back, CP unrelieved by Nitro. Symptoms reported of SOB, nausea, vomiting, sweating.  1. Are you having CP right now? no    2. Are you experiencing any other symptoms (ex. SOB, nausea, vomiting, sweating)? SOB, throwing up and sour stomach.   3. Is your CP continuous or coming and going? Coming and going   4. Have you taken Nitroglycerin ? no   5. How long have you been experiencing CP? Started after cath on 06/03    6. If NO CP at time of call then end call with telling Pt to call back or call 911 if Chest pain returns prior to return call from triage team.

## 2023-12-30 NOTE — ED Notes (Signed)
 IV team at bedside

## 2023-12-30 NOTE — Telephone Encounter (Signed)
 Pt called to report that she had a Cath yesterday and last night she developed chest pain, SOB, vomiting and it seems to be worsening... she will have her husband drive her to the ED for more immediate assessment.

## 2023-12-30 NOTE — H&P (Signed)
 History and Physical   Kelly Gibson YNW:[id number] DOB: [birth year] DOA: 12/30/2023  PCP: Annella Kief, NP   Patient coming from: Home  Chief Complaint: Chest pain  HPI: Kelly Gibson is a 63 y.o. female with medical history significant of hypertension, hyperlipidemia, diabetes, CVA with residual hemiparesis and dysphagia, AV block, CKD 3, CAD, depression, COPD presenting with chest pain.  Patient underwent recent cardiac cath just 2 days ago for further workup of issues with shortness of breath and admission where she was found to have significant elevated troponin.  At that time she was experiencing sepsis and catheterization was not pursued but she did receive aspirin , Plavix , heparin .  There was concern there for intermittent complete heart block and as she recovered she requested transfer to [facility].  Course they are also complicated by a staghorn calculus with left nephrostomy tube having been placed.  Ultimately cardiac cath was deferred and patient underwent Myoview  on 5/22 showing high risk study and catheterization was performed 2 days ago.  Found to have three-vessel disease with 3 chronic total occlusions with collaterals.  Concerned that her AV block may be due to ischemia.  She was evaluated for possible pacemaker placement however she was noted to have wandering atrial pacemaker with good junctional rhythm and was asymptomatic so there was not felt to be currently an indication for pacemaker placement per EP.  Presenting today with recurrent/increased intermittent chest pain since her catheterization.  Has reported some nausea and vomiting as well.  Denies fevers, chills, abdominal pain, constipation, diarrhea.  ED Course: Vital signs in the ED notable for heart rate in the 40s-60s.  Blood pressure in the 120s-150 systolic.  Respiratory rate in the 20s.  Lab workup included BMP with bicarb 21, creatinine 2.150 increased from recent baseline of 1.5-1.9, glucose 160.  CBC  with hemoglobin 11.8 near recent baseline of 12-13.  Troponin 132 with repeat pending.  BNP pending.  Chest x-ray with cardiomegaly and asymmetric pulmonary edema versus multifocal pneumonia.  Also noted was a masslike consolidation at the left lower lobe which was persistent and recommended CT with contrast for follow-up when able.  Cardiology consulted and will see the patient.  Request hospitalist admission given comorbidities.  Review of Systems: As per HPI otherwise all other systems reviewed and are negative.  Past Medical History:  Diagnosis Date   Bradycardia    SR/SB with up to 3.5 second pause (asymptomatic) on ~ 10/2017 event monitor; saw EP Dr. Nunzio Belch: avoid AV nodal blocking agents   Chronic kidney disease    COPD (chronic obstructive pulmonary disease) (HCC)    Depression    Depression with anxiety 08/29/2021   Diabetes mellitus    Dysarthria, post-stroke    Dysrhythmia    Ganglion cyst 03/09/2012   History of kidney stones    Hyperlipidemia    Hypertension    Hypokalemia    Myocardial infarction [facility])    NSTEMI   Obesity, unspecified 12/17/2011   Pneumonia    Pneumonia of left lower lobe due to infectious organism 12/12/2023   PONV (postoperative nausea and vomiting)    Skin lesion of face 11/17/2022   Stroke [facility])    2019   right side hemiparesis    Past Surgical History:  Procedure Laterality Date   CESAREAN SECTION     x 3   LEFT HEART CATH AND CORONARY ANGIOGRAPHY N/A 12/28/2023   Procedure: LEFT HEART CATH AND CORONARY ANGIOGRAPHY;  Surgeon: Cody Das, MD;  Location:  [facility] INVASIVE CV LAB;  Service: Cardiovascular;  Laterality: N/A;   ORIF WRIST FRACTURE Right 05/04/2018   Procedure: OPEN REDUCTION INTERNAL FIXATION (ORIF) RIGHT  WRIST FRACTURE;  Surgeon: Micheline Ahr, MD;  Location: [facility] OR;  Service: Orthopedics;  Laterality: Right;   TUMOR REMOVAL     left shoulder    Social History  reports that she has quit smoking. Her smoking use included  cigarettes. She has a 20 pack-year smoking history. She has never used smokeless tobacco. She reports that she does not drink alcohol and does not use drugs.  Allergies  Allergen Reactions   Latex Itching and Rash   Niacin Nausea And Vomiting    Family History  Problem Relation Age of Onset   Diabetes Mother    Stroke Mother    Heart disease Father    Cancer Father    Stroke Father   Reviewed on admission  Prior to Admission medications   Medication Sig Start Date End Date Taking? Authorizing Provider  albuterol  (VENTOLIN  HFA) 108 (90 Base) MCG/ACT inhaler Inhale 2 puffs into the lungs every 6 (six) hours as needed for wheezing or shortness of breath. 12/14/23   Etter Hermann., MD  amLODipine  (NORVASC ) 5 MG tablet Take 1 tablet (5 mg total) by mouth daily. Patient not taking: Reported on 12/23/2023 05/12/23   Annella Kief, NP  aspirin  EC 81 MG tablet Take 1 tablet (81 mg total) by mouth daily. Swallow whole. 12/15/23   Etter Hermann., MD  budesonide -glycopyrrolate -formoterol  (BREZTRI  AEROSPHERE) 160-9-4.8 MCG/ACT AERO inhaler Inhale 2 puffs into the lungs 2 (two) times daily. 12/14/23   Etter Hermann., MD  citalopram  (CELEXA ) 10 MG tablet TAKE 1 TABLET BY MOUTH DAILY 06/28/23   Early, Sara E, NP  Continuous Glucose Sensor (DEXCOM G7 SENSOR) MISC Take 1 Device by mouth See admin instructions. Change sensor every 10 days    [provider]  Doxylamine Succinate, Sleep, (UNISOM PO) Take 2 each by mouth at bedtime. Gel capsules    [provider]  furosemide  (LASIX ) 20 MG tablet Take 1 tablet (20 mg total) by mouth daily as needed. Patient taking differently: Take 20 mg by mouth daily as needed for edema or fluid. 10/05/22   Early, Sara E, NP  HUMALOG  KWIKPEN 100 UNIT/ML KwikPen INJECT 3 TO 5 UNITS INTO THE SKIN DAILY BEFORE LUNCH Patient taking differently: Inject 2-10 Units into the skin with breakfast, with lunch, and with evening meal. 11/29/23    Early, Sara E, NP  hydrALAZINE  (APRESOLINE ) 100 MG tablet Take 1 tablet (100 mg total) by mouth 2 (two) times daily. 12/28/23   Leala Prince, PA-C  insulin  degludec (TRESIBA  FLEXTOUCH) 100 UNIT/ML FlexTouch Pen Inject 10 Units into the skin 2 (two) times daily. Adjust by 2 units every 3 days as needed for blood sugars higher than 150 in the morning fasting. Patient taking differently: Inject 10-22 Units into the skin 2 (two) times daily. Take 22 units in the morning and take 10 units at night. Adjust by 2 units every 3 days as needed for blood sugars higher than 150 in the morning fasting. 10/05/22   Early, Sara E, NP  Melatonin 5 MG CHEW Chew 10 mg by mouth at bedtime.    [provider]  Multiple Vitamins-Minerals (MULTIVITAMIN GUMMIES WOMENS) CHEW Chew 2 each by mouth in the morning.    [provider]  olmesartan (BENICAR) 40 MG tablet Take 40 mg by mouth in the  morning. Patient not taking: Reported on 12/23/2023 05/12/23   [provider]  rosuvastatin  (CRESTOR ) 40 MG tablet TAKE 1 TABLET BY MOUTH DAILY Patient taking differently: Take 40 mg by mouth at bedtime. 04/08/23   Early, Sara E, NP  tiZANidine  (ZANAFLEX ) 2 MG tablet TAKE 1-2 TABLETS UP TO TWICE A DAY  AS NEEDED FOR MUSCLE SPASMS Patient taking differently: Take 2 mg by mouth at bedtime. 05/11/23   Early, Sara E, NP  TRADJENTA  5 MG TABS tablet Take 5 mg by mouth daily. Patient not taking: Reported on 12/11/2023    [provider]  UNABLE TO FIND Take 1 tablet by mouth with breakfast, with lunch, and with evening meal. GlucoGold -Berberine, Concentrated Cinnamon, Chromium, Banaba Leaf Extract    [provider]    Physical Exam: Vitals:   12/30/23 1130 12/30/23 1145 12/30/23 1200 12/30/23 1215  BP: (!) 148/58 137/83 (!) 151/60 (!) 125/58  Pulse: (!) 50 (!) 51 (!) 49 (!) 49  Resp: (!) 31 19 (!) 28 (!) 24  Temp:      SpO2: 99% 99% 99% 98%    Physical Exam Constitutional:      General:  She is not in acute distress.    Appearance: Normal appearance.  HENT:     Head: Normocephalic and atraumatic.     Mouth/Throat:     Mouth: Mucous membranes are moist.     Pharynx: Oropharynx is clear.  Eyes:     Extraocular Movements: Extraocular movements intact.     Pupils: Pupils are equal, round, and reactive to light.  Cardiovascular:     Rate and Rhythm: Normal rate and regular rhythm.     Pulses: Normal pulses.     Heart sounds: Normal heart sounds.  Pulmonary:     Effort: Pulmonary effort is normal. No respiratory distress.     Breath sounds: Wheezing and rales (Trace) present.  Abdominal:     General: Bowel sounds are normal. There is no distension.     Palpations: Abdomen is soft.     Tenderness: There is no abdominal tenderness.  Musculoskeletal:        General: No swelling or deformity.     Right lower leg: Edema present.     Left lower leg: Edema present.  Skin:    General: Skin is warm and dry.  Neurological:     General: No focal deficit present.     Mental Status: Mental status is at baseline.    Labs on Admission: I have personally reviewed following labs and imaging studies  CBC: Recent Labs  Lab 12/30/23 1036  WBC 6.8  HGB 11.8*  HCT 39.1  MCV 90.5  PLT 315    Basic Metabolic Panel: Recent Labs  Lab 12/30/23 1036  NA 140  K 4.3  CL 107  CO2 21*  GLUCOSE 160*  BUN 21  CREATININE 2.12*  CALCIUM  9.5    GFR: Estimated Creatinine Clearance: 29 mL/min (A) (by C-G formula based on SCr of 2.12 mg/dL (H)).  Liver Function Tests: No results for input(s): "AST", "ALT", "ALKPHOS", "BILITOT", "PROT", "ALBUMIN" in the last 168 hours.  Urine analysis:    Component Value Date/Time   COLORURINE YELLOW 05/18/2019 1430   APPEARANCEUR HAZY (A) 05/18/2019 1430   LABSPEC 1.016 05/18/2019 1430   PHURINE 6.0 05/18/2019 1430   GLUCOSEU NEGATIVE 05/18/2019 1430   HGBUR MODERATE (A) 05/18/2019 1430   BILIRUBINUR NEGATIVE 05/18/2019 1430   KETONESUR  NEGATIVE 05/18/2019 1430   PROTEINUR  100 (A) 05/18/2019 1430   UROBILINOGEN 0.2 01/26/2007 1410   NITRITE NEGATIVE 05/18/2019 1430   LEUKOCYTESUR NEGATIVE 05/18/2019 1430    Radiological Exams on Admission: DG Chest 2 View Result Date: 12/30/2023 CLINICAL DATA:  cp EXAM: CHEST - 2 VIEW COMPARISON:  Dec 12, 2023 FINDINGS: Diffuse interstitial opacities throughout the lungs. Patchy airspace opacities in the right suprahilar upper lobe and the left lung, mostly in the left lower lobe. No pleural effusion or pneumothorax. Mild cardiomegaly. Aortic atherosclerosis. No acute fracture or destructive lesion. Multilevel thoracic osteophytosis. IMPRESSION: Cardiomegaly with findings of either asymmetric pulmonary edema or multifocal pneumonia. Given the persistence of the masslike consolidation in the left lower lobe on the prior CT of the abdomen and pelvis, a follow-up chest CT with IV contrast is recommended. Electronically Signed   By: Rance Burrows M.D.   On: 12/30/2023 11:26   EKG: Independently reviewed.  Undetermined rhythm.  Possible wandering pacemaker but does appear to be high-grade AV block.?Junctional rhythm.  Currently rate 67 bpm.  Nonspecific T wave changes.  Assessment/Plan Principal Problem:   Unstable angina (HCC) Active Problems:   Type 2 diabetes mellitus with diabetic neuropathy, with long-term current use of insulin  (HCC)   COPD (chronic obstructive pulmonary disease) (HCC)   Hemiparesis affecting right side as late effect of cerebrovascular accident (CVA) (HCC)   Dysphagia, post-stroke   Chronic kidney disease (CKD), stage III (moderate) (HCC)   Hyperlipidemia associated with type 2 diabetes mellitus (HCC)   Hypertension associated with diabetes (HCC)   Major depression in remission (HCC)   Heart block AV complete (HCC)   Coronary artery calcification   Sick sinus syndrome (HCC)   CAD Unstable angina versus NSTEMI > Patient presented with increased intermittent chest  pain with associated nausea and vomiting since catheterization 2 days ago. > That time showed complex anatomy with three-vessel chronic total occlusion with some collateral blood flow. > Troponin elevation currently 132 with repeat pending.  Has had elevation in the tens of thousands in the recent past. > Cardiology consulted in the ED and are following. - Monitor on progressive unit for now considering concern for ACS and at risk for high degree AV block as below. - Appreciate cardiology recommendations and assistance - Trend troponin - Prophylaxis dose heparin  for now last full dose requested by cardiology - Continue home aspirin  and rosuvastatin   AV block > Unclear rhythm on EKG.  Recent EP notes indicated suspicion for wandering atrial pacemaker that there has been concern about intermittent high-grade AV block possibly ischemic in nature. - Appreciate cardiology recommendations and assistance - Monitor on progressive movement - Supportive care - Continue to hold any nodal blocking agents  AKI on CKD 3 > Creatinine currently elevated to 2.1 previous baseline was in the 1.5-1.6 range. > Appears volume up.  Patient does feels like she has not completely emptied her bladder so we will get bladder scan as well. - Follow-up BNP - Bladder scan, in and out cath if significant retention - Anticipate diuresis, but will wait for results - Trend renal function and electrolytes  Chronic diastolic CHF Volume overload > Some history of diastolic dysfunction with last echo being last month showing LVEF 50-55%, G1 DD. > Currently only prescribed Lasix  as needed but was told by cardiology recently to hold off on Lasix  per patient. > Concerned that she may have some mild exacerbation of chronic diastolic CHF - Follow-up BNP - Appreciate cardiology recommendations in the setting of her multiple cardiac issues -  Likely diuresis  History of staghorn calculus > History of staghorn calculus.  No  intervention thus far.  Was planned for intervention yesterday but due to her cardiac issues this has been delayed. > Issues with prior UTI.  Asymptomatic bacteriuria at this time.  Will rule out infection with urine culture. - Add on urine culture  Wheezing > Noted to have wheezing on exam which she says has been present for the past couple of days.  Will check for viral etiology.  No fevers reported no leukocytosis. - Flu COVID and RSV screen.  Consider full viral panel if negative. - Albuterol  x 1 - Continue with as needed inhalers  Imaging abnormality > Persistent left lower lobe masslike consolidation on chest x-ray. - Recommendation was for CT chest with consult, will hold off until renal function improves.  Hypertension - Continue home hydralazine   Diabetes > 10-15 units twice daily long-acting at home with SSI - 5 unit long-acting twice daily - SSI  History of CVA > History of dysphagia post CVA and also has residual right hemiparesis. - Continue ASA, rosuvastatin   Depression - Continue home Celexa   COPD - Continue home Breztri  and albuterol   DVT prophylaxis: Heparin  Code Status:   Full Family Communication:  Updated at bedside Disposition Plan:   Patient is from:  Home  Anticipated DC to:  Home  Anticipated DC date:  1 to 5 days  Anticipated DC barriers: None  Consults called:  Cardiology Admission status:  Observation, progressive  Severity of Illness: The appropriate patient status for this patient is OBSERVATION. Observation status is judged to be reasonable and necessary in order to provide the required intensity of service to ensure the patient's safety. The patient's presenting symptoms, physical exam findings, and initial radiographic and laboratory data in the context of their medical condition is felt to place them at decreased risk for further clinical deterioration. Furthermore, it is anticipated that the patient will be medically stable for discharge  from the hospital within 2 midnights of admission.    Johnetta Nab MD [facility]  How to contact the [facility] Attending or Consulting provider 7A - 7P or covering provider during after hours 7P -7A, for this patient?   Check the care team in [facility] and look for a) attending/consulting [facility] provider listed and b) the [facility] team listed Log into [email or web address] and use [facility]'s universal password to access. If you do not have the password, please contact the hospital operator. Locate the [facility] provider you are looking for under [facility] and page to a number that you can be directly reached. If you still have difficulty reaching the provider, please page the [facility] (Director on Call) for the Hospitalists listed on amion for assistance.  12/30/2023, 1:16 PM

## 2023-12-30 NOTE — ED Notes (Signed)
 CCMD called.

## 2023-12-31 ENCOUNTER — Other Ambulatory Visit: Payer: Self-pay | Admitting: Nurse Practitioner

## 2023-12-31 ENCOUNTER — Observation Stay (HOSPITAL_COMMUNITY)

## 2023-12-31 ENCOUNTER — Other Ambulatory Visit (HOSPITAL_COMMUNITY)

## 2023-12-31 DIAGNOSIS — J449 Chronic obstructive pulmonary disease, unspecified: Secondary | ICD-10-CM

## 2023-12-31 DIAGNOSIS — N179 Acute kidney failure, unspecified: Secondary | ICD-10-CM | POA: Diagnosis not present

## 2023-12-31 DIAGNOSIS — I200 Unstable angina: Secondary | ICD-10-CM | POA: Diagnosis not present

## 2023-12-31 DIAGNOSIS — I5031 Acute diastolic (congestive) heart failure: Secondary | ICD-10-CM

## 2023-12-31 DIAGNOSIS — I2723 Pulmonary hypertension due to lung diseases and hypoxia: Secondary | ICD-10-CM | POA: Diagnosis not present

## 2023-12-31 DIAGNOSIS — R918 Other nonspecific abnormal finding of lung field: Secondary | ICD-10-CM | POA: Diagnosis not present

## 2023-12-31 DIAGNOSIS — E1159 Type 2 diabetes mellitus with other circulatory complications: Secondary | ICD-10-CM | POA: Diagnosis not present

## 2023-12-31 DIAGNOSIS — R0609 Other forms of dyspnea: Secondary | ICD-10-CM

## 2023-12-31 DIAGNOSIS — I2510 Atherosclerotic heart disease of native coronary artery without angina pectoris: Secondary | ICD-10-CM | POA: Diagnosis not present

## 2023-12-31 DIAGNOSIS — I69391 Dysphagia following cerebral infarction: Secondary | ICD-10-CM | POA: Diagnosis not present

## 2023-12-31 DIAGNOSIS — J90 Pleural effusion, not elsewhere classified: Secondary | ICD-10-CM | POA: Diagnosis not present

## 2023-12-31 DIAGNOSIS — F418 Other specified anxiety disorders: Secondary | ICD-10-CM

## 2023-12-31 DIAGNOSIS — N200 Calculus of kidney: Secondary | ICD-10-CM | POA: Diagnosis not present

## 2023-12-31 DIAGNOSIS — F325 Major depressive disorder, single episode, in full remission: Secondary | ICD-10-CM | POA: Diagnosis not present

## 2023-12-31 DIAGNOSIS — I69351 Hemiplegia and hemiparesis following cerebral infarction affecting right dominant side: Secondary | ICD-10-CM | POA: Diagnosis not present

## 2023-12-31 LAB — CBC
HCT: 36.8 % (ref 36.0–46.0)
Hemoglobin: 11.4 g/dL — ABNORMAL LOW (ref 12.0–15.0)
MCH: 26.9 pg (ref 26.0–34.0)
MCHC: 31 g/dL (ref 30.0–36.0)
MCV: 86.8 fL (ref 80.0–100.0)
Platelets: 316 10*3/uL (ref 150–400)
RBC: 4.24 MIL/uL (ref 3.87–5.11)
RDW: 16.1 % — ABNORMAL HIGH (ref 11.5–15.5)
WBC: 5.6 10*3/uL (ref 4.0–10.5)
nRBC: 0 % (ref 0.0–0.2)

## 2023-12-31 LAB — BASIC METABOLIC PANEL WITH GFR
Anion gap: 13 (ref 5–15)
BUN: 18 mg/dL (ref 8–23)
CO2: 23 mmol/L (ref 22–32)
Calcium: 8.9 mg/dL (ref 8.9–10.3)
Chloride: 103 mmol/L (ref 98–111)
Creatinine, Ser: 2.24 mg/dL — ABNORMAL HIGH (ref 0.44–1.00)
GFR, Estimated: 24 mL/min — ABNORMAL LOW (ref >60)
Glucose, Bld: 118 mg/dL — ABNORMAL HIGH (ref 70–99)
Potassium: 3.4 mmol/L — ABNORMAL LOW (ref 3.5–5.1)
Sodium: 139 mmol/L (ref 135–145)

## 2023-12-31 LAB — GLUCOSE, CAPILLARY
Glucose-Capillary: 100 mg/dL — ABNORMAL HIGH (ref 70–99)
Glucose-Capillary: 102 mg/dL — ABNORMAL HIGH (ref 70–99)
Glucose-Capillary: 152 mg/dL — ABNORMAL HIGH (ref 70–99)
Glucose-Capillary: 228 mg/dL — ABNORMAL HIGH (ref 70–99)
Glucose-Capillary: 197 mg/dL — ABNORMAL HIGH (ref 70–99)

## 2023-12-31 MED ORDER — FUROSEMIDE 10 MG/ML IJ SOLN
40.0000 mg | Freq: Once | INTRAMUSCULAR | Status: AC
  Administered 2023-12-31: 40 mg via INTRAVENOUS
  Filled 2023-12-31: qty 4

## 2023-12-31 MED ORDER — POTASSIUM CHLORIDE CRYS ER 20 MEQ PO TBCR
40.0000 meq | EXTENDED_RELEASE_TABLET | ORAL | Status: AC
  Administered 2023-12-31: 40 meq via ORAL
  Filled 2023-12-31: qty 2

## 2023-12-31 NOTE — TOC CM/SW Note (Signed)
 Transition of Care [facility]) - Inpatient Brief Assessment   Patient Details  Name: Kelly Gibson MRN: [id number] Date of Birth: Dec 12, 1960  [facility] CM/SW Contact:    Juliane Och, LCSW Phone Number: 12/31/2023, 9:24 AM   Clinical Narrative:  9:24 AM Per chart review, patient resides at home with spouse. Patient has a PCP and insurance. Patient has CIR but not SNF history. Patient has recent HH history with [facility]. Patient has DME history (cane, rolling walker, stair lift chair). No TOC needs were identified at this time. TOC will continue to follow and be available to assist.  Transition of Care Asessment: Insurance and Status: Insurance coverage has been reviewed Patient has primary care physician: Yes Home environment has been reviewed: Private Residence Prior level of function:: N/A Prior/Current Home Services: No current home services (Has HH/DME history) Social Drivers of Health Review: SDOH reviewed no interventions necessary Readmission risk has been reviewed: Yes Transition of care needs: no transition of care needs at this time

## 2023-12-31 NOTE — Progress Notes (Signed)
 Progress Note   Patient: Kelly Gibson:[id number] DOB: September 16, 1960 DOA: 12/30/2023     0 DOS: the patient was seen and examined on 12/31/2023   Brief hospital course: Kelly Gibson is a 63 y.o. female with medical history significant of hypertension, hyperlipidemia, diabetes, CVA with residual hemiparesis and dysphagia, AV block, CKD 3, CAD, depression, COPD presenting with chest pain.   Patient underwent recent cardiac cath just 2 days ago for further workup of issues with shortness of breath and admission where she was found to have significant elevated troponin.  At that time she was experiencing sepsis and catheterization was not pursued but she did receive aspirin , Plavix , heparin .  There was concern there for intermittent complete heart block and as she recovered she requested transfer to [facility].  Course they are also complicated by a staghorn calculus with left nephrostomy tube having been placed.   Ultimately cardiac cath was deferred and patient underwent Myoview  on 5/22 showing high risk study and catheterization was performed 2 days ago.  Found to have three-vessel disease with 3 chronic total occlusions with collaterals.  Concerned that her AV block may be due to ischemia.  She was evaluated for possible pacemaker placement however she was noted to have wandering atrial pacemaker with good junctional rhythm and was asymptomatic so there was not felt to be currently an indication for pacemaker placement per EP.   Presenting today with recurrent/increased intermittent chest pain since her catheterization.  Has reported some nausea and vomiting as well.  Assessment and Plan: CAD Unstable angina versus NSTEMI Patient presented with increased intermittent chest pain with associated nausea and vomiting since catheterization 2 days ago. That time showed complex anatomy with three-vessel chronic total occlusion with some collateral blood flow. Troponin elevation currently 132 is  lot less than recent past. Cardiology consulted advised CT surgery evaluation for further management. Monitor on progressive unit for now considering concern for ACS and at risk for high degree AV block as below. Continue home aspirin  and rosuvastatin    AV block Unclear rhythm on EKG.  Recent EP notes indicated suspicion for wandering atrial pacemaker that there has been concern about intermittent high-grade AV block possibly ischemic in nature. Supportive care Continue to hold any nodal blocking agents   AKI on CKD 3 Creatinine currently elevated to 2.24 previous baseline was in the 1.5-1.6 range. Appears volume up.  Patient does feels like she has not completely emptied her bladder so we will get bladder scan as well. Renal sono rule out obstruction a she has renal calculus.  Chronic diastolic CHF Volume overload Last LVEF 50-55%, G1 DD. Currently only prescribed Lasix  as needed but was told by cardiology recently to hold off on Lasix  per patient. Concerned that she may have some mild exacerbation of chronic diastolic CHF Cardiology service advised IV Lasix  which she tolerated well.   History of staghorn calculus History of staghorn calculus.  No intervention thus far.  Was planned for intervention yesterday but due to her cardiac issues this has been delayed. Urinalysis unremarkable. Blood cultures negative.   Imaging abnormality Persistent left lower lobe masslike consolidation on chest x-ray. CT check with out contrast ordered.   Hypertension Continue home hydralazine    Diabetes 10-15 units twice daily long-acting at home with SSI 15 unit long-acting twice daily SSI   History of CVA History of dysphagia post CVA and also has residual right hemiparesis. Continue ASA, rosuvastatin    Depression Continue home Celexa    COPD Continue home Breztri   and albuterol           Out of bed to chair. Incentive spirometry. Nursing supportive care. Fall, aspiration  precautions. Diet:  Diet Orders (From admission, onward)     Start     Ordered   12/30/23 1454  Diet heart healthy/carb modified Room service appropriate? Yes; Fluid consistency: Thin  Diet effective now       Question Answer Comment  Diet-HS Snack? Nothing   Room service appropriate? Yes   Fluid consistency: Thin      12/30/23 1453           DVT prophylaxis: heparin  injection 5,000 Units Start: 12/30/23 2200  Level of care: Progressive   Code Status: Full Code  Subjective: Patient is seen and examined today morning. She asked about her medical issues. Husband at bedside. I did discuss with them the plan. She denies chest pain, shortness of breath. Eating fair.  Physical Exam: Vitals:   12/31/23 0733 12/31/23 1202 12/31/23 1459 12/31/23 1535  BP: 137/62 126/61  124/66  Pulse: 70 72    Resp: 19 18  19   Temp: 98.9 F (37.2 C) 97.9 F (36.6 C)  98.5 F (36.9 C)  TempSrc: Oral Oral Oral Oral  SpO2: 94% 95%  100%  Weight:      Height:        General - Middle aged African American female, no apparent distress HEENT - PERRLA, EOMI, atraumatic head, non tender sinuses. Lung - Clear, basal rales, rhonchi, no wheezes. Heart - S1, S2 heard, no murmurs, rubs, trace pedal edema. Abdomen - Soft, non tender, bowel sounds 3 Neuro - Alert, awake and oriented x 3, right sided weakness. Skin - Warm and dry.  Data Reviewed:      Latest Ref Rng & Units 12/31/2023    2:31 AM 12/30/2023   10:36 AM 12/14/2023    6:17 AM  CBC  WBC 4.0 - 10.5 K/uL 5.6  6.8  11.7   Hemoglobin 12.0 - 15.0 g/dL 16.1  09.6  04.5   Hematocrit 36.0 - 46.0 % 36.8  39.1  47.4   Platelets 150 - 400 K/uL 316  315  327       Latest Ref Rng & Units 12/31/2023    2:31 AM 12/30/2023   10:36 AM 12/14/2023    6:17 AM  BMP  Glucose 70 - 99 mg/dL 409  811  914   BUN 8 - 23 mg/dL 18  21  43   Creatinine 0.44 - 1.00 mg/dL 7.82  9.56  2.13   Sodium 135 - 145 mmol/L 139  140  141   Potassium 3.5 - 5.1 mmol/L 3.4  4.3   3.7   Chloride 98 - 111 mmol/L 103  107  97   CO2 22 - 32 mmol/L 23  21  28    Calcium  8.9 - 10.3 mg/dL 8.9  9.5  08.6    CT CHEST WO CONTRAST Result Date: 12/31/2023 CLINICAL DATA:  Pulmonary hypertension, respiratory illness, shortness of breath, left lower lobe consolidation * Tracking Code: BO * EXAM: CT CHEST WITHOUT CONTRAST TECHNIQUE: Multidetector CT imaging of the chest was performed following the standard protocol without IV contrast. RADIATION DOSE REDUCTION: This exam was performed according to the departmental dose-optimization program which includes automated exposure control, adjustment of the mA and/or kV according to patient size and/or use of iterative reconstruction technique. COMPARISON:  Chest radiograph, 12/30/2023 FINDINGS: Cardiovascular: Aortic atherosclerosis. Normal heart size. Three-vessel coronary artery calcifications. No pericardial  effusion. Mediastinum/Nodes: Prominent mediastinal lymph nodes, pretracheal nodes measuring up to 1.4 x 1.4 cm. Thyroid gland, trachea, and esophagus demonstrate no significant findings. Lungs/Pleura: Dense, masslike consolidation of the dependent left lower lobe measuring up to 6.1 x 3.9 cm with internal air bronchograms (series 4, image 93). Mild interlobular septal thickening in the lung bases. Small left pleural effusion. Scattered areas of bandlike scarring in both lung bases. Upper Abdomen: No acute abnormality. Musculoskeletal: No chest wall abnormality. No acute osseous findings. IMPRESSION: 1. Dense, masslike consolidation of the dependent left lower lobe measuring up to 6.1 x 3.9 cm with internal air bronchograms. This may reflect infection or aspiration but is generally somewhat worrisome for malignancy. Recommend short interval follow-up to ensure resolution. 2. Mild interlobular septal thickening in the lung bases, consistent with mild pulmonary edema. 3. Small left pleural effusion. 4. Prominent mediastinal lymph nodes, likely reactive.  Attention on follow-up. 5. Coronary artery disease. Aortic Atherosclerosis (ICD10-I70.0). Electronically Signed   By: Fredricka Jenny M.D.   On: 12/31/2023 11:57   DG Chest 2 View Result Date: 12/30/2023 CLINICAL DATA:  cp EXAM: CHEST - 2 VIEW COMPARISON:  Dec 12, 2023 FINDINGS: Diffuse interstitial opacities throughout the lungs. Patchy airspace opacities in the right suprahilar upper lobe and the left lung, mostly in the left lower lobe. No pleural effusion or pneumothorax. Mild cardiomegaly. Aortic atherosclerosis. No acute fracture or destructive lesion. Multilevel thoracic osteophytosis. IMPRESSION: Cardiomegaly with findings of either asymmetric pulmonary edema or multifocal pneumonia. Given the persistence of the masslike consolidation in the left lower lobe on the prior CT of the abdomen and pelvis, a follow-up chest CT with IV contrast is recommended. Electronically Signed   By: Rance Burrows M.D.   On: 12/30/2023 11:26    Family Communication: Discussed with patient, husband. They understand and agree. All questions answered.  Disposition: Status is: Observation The patient remains OBS appropriate and will d/c before 2 midnights.  Planned Discharge Destination: Home with Home Health     Time spent: 42 minutes  Author: Aisha Hove, MD 12/31/2023 3:45 PM Secure chat 7am to 7pm For on call review [email or web address].

## 2023-12-31 NOTE — Consult Note (Addendum)
 [address]       [place] [address]             [phone]        [name] Medical Record #[id number] Date of Birth: Dec 21, 1960  Referring: Hazle Lites, MD Primary Care: Annella Kief, NP   Reason for Consult: Evaluation for potential coronary bypass grafting.   History of Present Illness:     [name] is a 63 year old female with a complex past medical history eluding hypertension, type 2 diabetes mellitus, dyslipidemia, chronic obstructive pulmonary disease, depression, and history of CVA in 2019 with residual right upper and lower extremity weakness.  She was recently visiting in [place]  in May of this year when she presented to a community medical clinic for evaluation of shortness of breath.  She was found to have acute urosepsis and also ruled in for ST elevation myocardial infarction.  She was found to have a staghorn calculus in her left kidney.  She decided to be transferred to [facility] for further evaluation.  She was not felt to be a candidate for immediate surgery to address the staghorn calculus by urology due to her recent acute MI.  Cardiology consultation was requested both to assess her coronary disease and also address advanced A-V dissociation noted on the EKG.  She was seen by the electrophysiology team who felt she primarily had a wandering pacemaker and placement of a permanent pacemaker was not indicated.  An outpatient Myoview  study was obtained on 12/16/2023 suggesting severe ischemia in the LAD territory.  Left heart catheterization was recommended for further investigation and was carried out on 12/28/2023 demonstrating severe multivessel coronary artery disease including a mid LAD occlusion with distal collateralization, 70% proximal OM1 stenosis and OM 2 occlusion.  The mid right coronary artery was also occluded with collaterals reconstituting the distal vessel. She was discharged home  following left heart catheterization but returned on 12/30/2023 to the emergency room with recurrent chest pain.  Evaluation in the ED included troponin of 130 with repeat at 140 yesterday afternoon.  EKG had an unclear baseline rhythm with evidence of inferior and anterior infarcts, age undetermined.  There were new T wave inversions in leads II and III.  Creatinine had risen further to 2.1.  Chest x-ray showed several densities consistent with multifocal pneumonia and possible mass effect in the left base that has not yet been defined. [name] was admitted to the hospital by the hospitalist team and has been once again by the cardiology team.  After discussion of her case and the cath conference this morning, patient was made to request CT surgery evaluation for possible coronary bypass grafting with the understanding that she is not a candidate for immediate surgery but may progress to reasonable surgical candidacy at a later date. [name] is currently resting in the bedside chair with her husband at the bedside.  She denies any chest pain or shortness of breath currently but tells me she has had terrible brief episodes of chest pain since her admission.    Current Activity/ Functional Status: She said she spends most of her day in a wheelchair but is able to get out of the chair by herself and use a walker for short distances.    Zubrod Score: At the time of surgery this patient's most appropriate activity status/level should be described as: []     0    Normal activity,  no symptoms []     1    Restricted in physical strenuous activity but ambulatory, able to do out light work []     2    Ambulatory and capable of self care, unable to do work activities, up and about                 more than 50%  Of the time                            [x]     3    Only limited self care []     4    Completely disabled, no self care, confined to bed or chair []     5    Moribund  Past Medical History:  Diagnosis  Date   Bradycardia    SR/SB with up to 3.5 second pause (asymptomatic) on ~ 10/2017 event monitor; saw EP Dr. Nunzio Belch: avoid AV nodal blocking agents   Chronic kidney disease    COPD (chronic obstructive pulmonary disease) (HCC)    Depression    Depression with anxiety 08/29/2021   Diabetes mellitus    Dysarthria, post-stroke    Dysrhythmia    Ganglion cyst 03/09/2012   History of kidney stones    Hyperlipidemia    Hypertension    Hypokalemia    Myocardial infarction [facility])    NSTEMI   Obesity, unspecified 12/17/2011   Pneumonia    Pneumonia of left lower lobe due to infectious organism 12/12/2023   PONV (postoperative nausea and vomiting)    Skin lesion of face 11/17/2022   Stroke [facility])    2019   right side hemiparesis    Past Surgical History:  Procedure Laterality Date   CESAREAN SECTION     x 3   LEFT HEART CATH AND CORONARY ANGIOGRAPHY N/A 12/28/2023   Procedure: LEFT HEART CATH AND CORONARY ANGIOGRAPHY;  Surgeon: Cody Das, MD;  Location: [facility] INVASIVE CV LAB;  Service: Cardiovascular;  Laterality: N/A;   ORIF WRIST FRACTURE Right 05/04/2018   Procedure: OPEN REDUCTION INTERNAL FIXATION (ORIF) RIGHT  WRIST FRACTURE;  Surgeon: Micheline Ahr, MD;  Location: [facility] OR;  Service: Orthopedics;  Laterality: Right;   TUMOR REMOVAL     left shoulder    Social History   Tobacco Use  Smoking Status Former   Current packs/day: 1.00   Average packs/day: 1 pack/day for 20.0 years (20.0 ttl pk-yrs)   Types: Cigarettes  Smokeless Tobacco Never  Tobacco Comments   stopped 2018    Social History   Substance and Sexual Activity  Alcohol Use No     Allergies  Allergen Reactions   Latex Itching and Rash   Niacin Nausea And Vomiting    Current Facility-Administered Medications  Medication Dose Route Frequency Provider Last Rate Last Admin   acetaminophen  (TYLENOL ) tablet 650 mg  650 mg Oral Q4H PRN Melvin, Alexander B, MD       albuterol  (PROVENTIL ) (2.5 MG/3ML)  0.083% nebulizer solution 2.5 mg  2.5 mg Inhalation Q6H PRN Johnetta Nab, MD       aspirin  EC tablet 81 mg  81 mg Oral Daily Melvin, Alexander B, MD   81 mg at 12/31/23 0845   budesonide -glycopyrrolate -formoterol  (BREZTRI ) 160-9-4.8 MCG/ACT inhaler 2 puff  2 puff Inhalation BID Melvin, Alexander B, MD   2 puff at 12/31/23 0846   citalopram  (CELEXA ) tablet 10 mg  10 mg Oral Daily Melvin, Alexander B, MD  10 mg at 12/31/23 0845   furosemide  (LASIX ) injection 40 mg  40 mg Intravenous Once Hilty, Kenneth C, MD       heparin  injection 5,000 Units  5,000 Units Subcutaneous Q8H Melvin, Alexander B, MD   5,000 Units at 12/31/23 1324   hydrALAZINE  (APRESOLINE ) tablet 100 mg  100 mg Oral BID Melvin, Alexander B, MD   100 mg at 12/31/23 0845   insulin  aspart (novoLOG ) injection 0-9 Units  0-9 Units Subcutaneous TID WC Melvin, Alexander B, MD       insulin  glargine-yfgn (SEMGLEE ) injection 5 Units  5 Units Subcutaneous BID Melvin, Alexander B, MD   5 Units at 12/31/23 4010   melatonin tablet 10 mg  10 mg Oral QHS Melvin, Alexander B, MD   10 mg at 12/30/23 2207   nitroGLYCERIN  (NITROSTAT ) SL tablet 0.4 mg  0.4 mg Sublingual Q5 Min x 3 PRN Melvin, Alexander B, MD       ondansetron  (ZOFRAN ) injection 4 mg  4 mg Intravenous Q6H PRN Johnetta Nab, MD       potassium chloride  SA (KLOR-CON  M) CR tablet 40 mEq  40 mEq Oral NOW Hilty, Aviva Lemmings, MD       rosuvastatin  (CRESTOR ) tablet 40 mg  40 mg Oral QHS Melvin, Alexander B, MD   40 mg at 12/30/23 2206    Medications Prior to Admission  Medication Sig Dispense Refill Last Dose/Taking   albuterol  (VENTOLIN  HFA) 108 (90 Base) MCG/ACT inhaler Inhale 2 puffs into the lungs every 6 (six) hours as needed for wheezing or shortness of breath. 18 g 2 Past Week   aspirin  EC 81 MG tablet Take 1 tablet (81 mg total) by mouth daily. Swallow whole. 30 tablet 12 12/29/2023   budesonide -glycopyrrolate -formoterol  (BREZTRI  AEROSPHERE) 160-9-4.8 MCG/ACT AERO inhaler  Inhale 2 puffs into the lungs 2 (two) times daily. 10.7 g 1 12/29/2023   citalopram  (CELEXA ) 10 MG tablet TAKE 1 TABLET BY MOUTH DAILY 90 tablet 1 12/29/2023   Continuous Glucose Sensor (DEXCOM G7 SENSOR) MISC Take 1 Device by mouth See admin instructions. Change sensor every 10 days   12/28/2023   Doxylamine Succinate, Sleep, (UNISOM PO) Take 2 each by mouth at bedtime. Gel capsules   12/29/2023   furosemide  (LASIX ) 20 MG tablet Take 1 tablet (20 mg total) by mouth daily as needed. (Patient taking differently: Take 20 mg by mouth daily as needed for edema or fluid.) 90 tablet 1 Past Week   HUMALOG  KWIKPEN 100 UNIT/ML KwikPen INJECT 3 TO 5 UNITS INTO THE SKIN DAILY BEFORE LUNCH (Patient taking differently: Inject 2-10 Units into the skin with breakfast, with lunch, and with evening meal.) 6 mL 3 12/29/2023   hydrALAZINE  (APRESOLINE ) 100 MG tablet Take 1 tablet (100 mg total) by mouth 2 (two) times daily. 60 tablet 1 12/29/2023   insulin  degludec (TRESIBA  FLEXTOUCH) 100 UNIT/ML FlexTouch Pen Inject 10 Units into the skin 2 (two) times daily. Adjust by 2 units every 3 days as needed for blood sugars higher than 150 in the morning fasting. (Patient taking differently: Inject 10-22 Units into the skin 2 (two) times daily. Take 22 units in the morning and take 10 units at night. Adjust by 2 units every 3 days as needed for blood sugars higher than 150 in the morning fasting.) 9 mL 11 12/29/2023   Melatonin 5 MG CHEW Chew 10 mg by mouth at bedtime.   12/29/2023   Multiple Vitamins-Minerals (MULTIVITAMIN GUMMIES WOMENS) CHEW Chew 2 each by  mouth in the morning.   12/29/2023   rosuvastatin  (CRESTOR ) 40 MG tablet TAKE 1 TABLET BY MOUTH DAILY (Patient taking differently: Take 40 mg by mouth at bedtime.) 90 tablet 3 12/29/2023   tiZANidine  (ZANAFLEX ) 2 MG tablet TAKE 1-2 TABLETS UP TO TWICE A DAY  AS NEEDED FOR MUSCLE SPASMS (Patient taking differently: Take 2 mg by mouth at bedtime.) 180 tablet 1 12/29/2023   UNABLE TO FIND Take 1  tablet by mouth with breakfast, with lunch, and with evening meal. GlucoGold -Berberine, Concentrated Cinnamon, Chromium, Banaba Leaf Extract   12/29/2023   [Paused] amLODipine  (NORVASC ) 5 MG tablet Take 1 tablet (5 mg total) by mouth daily. (Patient not taking: Reported on 12/23/2023) 90 tablet 3    [Paused] olmesartan (BENICAR) 40 MG tablet Take 40 mg by mouth in the morning. (Patient not taking: Reported on 12/23/2023)      [Paused] TRADJENTA  5 MG TABS tablet Take 5 mg by mouth daily. (Patient not taking: Reported on 12/11/2023)       Family History  Problem Relation Age of Onset   Diabetes Mother    Stroke Mother    Heart disease Father    Cancer Father    Stroke Father      Review of Systems:       Cardiac Review of Systems: Y or  [    ]= no  Chest Pain [   x ]  Resting SOB [   ] Exertional SOB  [ x ]  Orthopnea [  ]   Pedal Edema [   ]    Palpitations [  ] Syncope  [  ]   Presyncope [   ]  General Review of Systems: [Y] = yes [  ]=no Constitional: recent weight change [  ]; anorexia [  ]; fatigue [  ]; nausea [  ]; night sweats [  ]; fever [  ]; or chills [  ]                                                               Dental: Last Dentist visit: no recent visits. Says she doesn't like to go.   Eye : blurred vision [  ]; diplopia [   ]; vision changes [  ];  Amaurosis fugax[  ]; Resp: cough [  ];  wheezing[  ];  hemoptysis[  ]; shortness of breath[ x ]; paroxysmal nocturnal dyspnea[  ]; dyspnea on exertion[  ]x; or orthopnea[  ];  GI:  gallstones[  ], vomiting[  ];  dysphagia[  ]; melena[  ];  hematochezia [  ]; heartburn[  ];   Hx of  Colonoscopy[  ]; GU: kidney stones [ x ]; hematuria[  ];   dysuria [  ];  nocturia[  ];  history of     obstruction [  ]; urinary frequency [  ]             Skin: rash, swelling[  ];, hair loss[  ];  peripheral edema[  ];  or itching[  ]; Musculosketetal: myalgias[  ];  joint swelling[  ];  joint erythema[  ];  joint pain[  ];  back pain[   ];  Heme/Lymph: bruising[  ];  bleeding[  ];  anemia[  ];  Neuro: TIA[  ];  headaches[  ];  stroke[ x, right hemiparesis ];  vertigo[  ];  seizures[  ];   paresthesias[  ];  difficulty walking[ x ];  Psych:depression[  ]; anxiety[  ];  Endocrine: diabetes[ x ];  thyroid dysfunction[  ];              Physical Exam: BP 137/62 (BP Location: Left Arm)   Pulse 70   Temp 98.9 F (37.2 C) (Oral)   Resp 19   Ht 5\' 3"  (1.6 m)   Wt 87.1 kg   LMP 12/10/2012 (Exact Date)   SpO2 94%   BMI 34.01 kg/m    General appearance: alert, cooperative, and mild distress Head: Normocephalic, without obvious abnormality, atraumatic Neck: no adenopathy, no carotid bruit, no JVD, and supple, symmetrical, trachea midline Lymph nodes: no cervical or clavicular adenopathy Resp: Breath sounds full, clear to auscultation.  Slight increase in work of breathing on room air. Cardio: Irregular rhythm, heart rate 72/min.  Monitor shows variable P waves consistent with sick sinus syndrome versus intermittent heart block GI: Soft, no tenderness, few bowel sounds present. Extremities: Rigidity in the right upper extremity with contracture of the right wrist.  Extremities otherwise without deformity.  Distal pulses are all palpable.  There is no peripheral edema. Neurologic: Mental status is appropriate.  Speech is mildly slurred but easily understood.  Has right hemiparesis with no motor function in the right upper extremity and significantly diminished function in the right lower extremity.  Normal strength and range of motion on the left.  Diagnostic Studies & Laboratory data:  LEFT HEART CATH AND CORONARY ANGIOGRAPHY   Conclusion  Coronary angiography 12/28/2023: LM: No significant disease LAD: Moderate sized vessel.          Mid vessel occlusion with moderate calcification, after a large septal branch          Distal vessel filled with collaterals from septal branches Lcx: Moderate sized vessel         Small OM1  with prox 70% stenosis         Medium caliber OM2 with prox occlusion         Distal vessel filled with collaterals from septal branches RCA: Small to medium caliber dominant vessel with prox diffuse 50% disease           Mid vessel occlusion           Distal RCA/RPDA filled with collaterals from septal branches   LVEDP 18 mmHg      Extremely complex situation in this patient with multiple comorbidities including prior stroke with right-sided hemiparesis, diabetes.  Her shortness of breath is new since her recent hospitalization when she had non-STEMI in the setting of urosepsis due to staghorn calculus that requires definitive surgery at some point by urology, pending cardiac risk stratification and optimization.  Echocardiogram showed inferior akinesis but preserved LVEF around 55%.  Stress test showed no infarct, but anterior ischemia with stress EF 44%.  Coronary angiogram today shows 3 distinct occlusions of mid LAD, proximal OM 2, mid RCA with faint collateralization. In addition, she has at least isorhythmic A-V dissociation, if not intermittent complete AV block on EKG.  I suspect this is also ischemic in etiology.  She is hemodynamically stable at this time.   Ideally, her coronary anatomy is suitable for surgical revascularization.  However, multiple comorbidities could limit surgical candidacy.  I would like to discuss her case in multidisciplinary heart team meeting  for further decision-making.   I will obtain a twelve-lead EKG.  She had similar appearance on EKG last week in the office as well.  She is hemodynamically stable from her A-V dissociation.  I will consider discharging home on a monitor, versus inpatient EP consult, after obtaining the EKG.   Cody Das, MD   ECHOCARDIOGRAM LIMITED REPORT     Patient Name:   Kelly Gibson Date of Exam: 12/12/2023  Medical Rec #:  [id number]         Height:       63.0 in  Accession #:    [id number]        Weight:       194.9  lb  Date of Birth:  03-07-1961        BSA:          1.912 m  Patient Age:    62 years          BP:           157/64 mmHg  Patient Gender: F                 HR:           49 bpm.  Exam Location:  Inpatient   Procedure: 2D Echo, Limited Echo, Cardiac Doppler and Color Doppler (Both             Spectral and Color Flow Doppler were utilized during  procedure).   Indications:   NSTEMI    History:        Patient has prior history of Echocardiogram examinations,  most                 recent 10/27/2017.    Sonographer:    Janette Medley  Referring Phys: [id number] KAMAL H HENDERSON   IMPRESSIONS     1. Left ventricular ejection fraction, by estimation, is 50 to 55%. The  left ventricle has low normal function. The left ventricle demonstrates  regional wall motion abnormalities (see scoring diagram/findings for  description). There is moderate left  ventricular hypertrophy. Left ventricular diastolic parameters are  consistent with Grade I diastolic dysfunction (impaired relaxation).   2. The aortic valve is calcified. There is moderate calcification of the  aortic valve. Aortic valve regurgitation is not visualized.   3. The inferior vena cava is normal in size with greater than 50%  respiratory variability, suggesting right atrial pressure of 3 mmHg.   FINDINGS   Left Ventricle: Left ventricular ejection fraction, by estimation, is 50  to 55%. The left ventricle has low normal function. The left ventricle  demonstrates regional wall motion abnormalities. There is moderate left  ventricular hypertrophy. Left  ventricular diastolic parameters are consistent with Grade I diastolic  dysfunction (impaired relaxation).     LV Wall Scoring:  The inferior wall and mid inferolateral segment are akinetic.   Left Atrium: Left atrial size was normal in size.   Aortic Valve: The aortic valve is calcified. There is moderate  calcification of the aortic valve. Aortic valve regurgitation is not   visualized.   Aorta: The aortic root and ascending aorta are structurally normal, with  no evidence of dilitation.   Venous: The inferior vena cava is normal in size with greater than 50%  respiratory variability, suggesting right atrial pressure of 3 mmHg.   LEFT VENTRICLE  PLAX 2D  LVIDd:         4.20 cm   Diastology  LVIDs:  2.70 cm   LV e' medial:   6.96 cm/s  LV PW:         1.20 cm   LV E/e' medial: 13.9  LV IVS:        1.40 cm  LVOT diam:     2.00 cm  LVOT Area:     3.14 cm     RIGHT VENTRICLE            IVC  RV S prime:     9.46 cm/s  IVC diam: 1.20 cm  TAPSE (M-mode): 2.0 cm   LEFT ATRIUM         Index  LA diam:    3.50 cm 1.83 cm/m     AORTA  Ao Root diam: 3.00 cm  Ao Asc diam:  3.20 cm   MITRAL VALVE  MV Area (PHT): 2.26 cm     SHUNTS  MV Decel Time: 335 msec     Systemic Diam: 2.00 cm  MV E velocity: 96.40 cm/s  MV A velocity: 113.00 cm/s  MV E/A ratio:  0.85   Dorothye Gathers MD  Electronically signed by Dorothye Gathers MD  Signature Date/Time: 12/12/2023/11:47:27 AM      Recent Radiology Findings:   DG Chest 2 View Result Date: 12/30/2023 CLINICAL DATA:  cp EXAM: CHEST - 2 VIEW COMPARISON:  Dec 12, 2023 FINDINGS: Diffuse interstitial opacities throughout the lungs. Patchy airspace opacities in the right suprahilar upper lobe and the left lung, mostly in the left lower lobe. No pleural effusion or pneumothorax. Mild cardiomegaly. Aortic atherosclerosis. No acute fracture or destructive lesion. Multilevel thoracic osteophytosis. IMPRESSION: Cardiomegaly with findings of either asymmetric pulmonary edema or multifocal pneumonia. Given the persistence of the masslike consolidation in the left lower lobe on the prior CT of the abdomen and pelvis, a follow-up chest CT with IV contrast is recommended. Electronically Signed   By: Rance Burrows M.D.   On: 12/30/2023 11:26     I have independently reviewed the above radiologic studies and discussed with the patient    Recent Lab Findings: Lab Results  Component Value Date   WBC 5.6 12/31/2023   HGB 11.4 (L) 12/31/2023   HCT 36.8 12/31/2023   PLT 316 12/31/2023   GLUCOSE 118 (H) 12/31/2023   CHOL 146 10/05/2022   TRIG 175 (H) 10/05/2022   HDL 53 10/05/2022   LDLDIRECT 61 10/05/2022   LDLCALC 64 10/05/2022   ALT 12 12/14/2023   AST 25 12/14/2023   NA 139 12/31/2023   K 3.4 (L) 12/31/2023   CL 103 12/31/2023   CREATININE 2.24 (H) 12/31/2023   BUN 18 12/31/2023   CO2 23 12/31/2023   TSH 2.28 07/04/2019   INR 1.0 12/13/2023   HGBA1C 8.0 (H) 12/11/2023      Assessment / Plan:      - Severe multivessel coronary artery disease: Recent ST elevation myocardial infarction with troponin greater than 15,000 reported on admission to the hospital in [place]  in May of this year.  Transferred back to [facility] and underwent outpatient left heart catheterization on 12/28/2023 demonstrating total occlusions in the LAD, OM2, and RCA.  Readmitted to the hospital on 12/30/2023 for recurrent chest pain.  Currently stable and pain-free but reports having brief episodes of chest pain since this admission.  Complex situation given her multiple medical problems and comorbidities.  Her residual neurologic deficits from a stroke in 5621 would certainly present significant challenges to postoperative recovery and rehab.  Dr. Deloise Ferries will review  and comment further regarding potential candidacy for CABG.  - Staghorn calculus left kidney: She has been evaluated by Dr. Dulcy Gibney who has noted this could be treated medically for several months with antibiotics while her coronary disease is being managed.  She is currently not on any antibiotics  - Type 2 diabetes mellitus: Recent hemoglobin A1c 8.0.  Managed with Tradjenta  and insulin  prior to admission.  - Hypertension: Controlled.  On amlodipine , olmesartan, and hydralazine  prior to admission.  - Dyslipidemia: On rosuvastatin   -History of CVA: Persistent right upper and  lower extremity weakness since the event in 2019.  -History of COPD: Has a 20-pack-year smoking history but quit in 2018  -Stage III CKD  -Sick sinus syndrome/intermittent heart block: Recently evaluated by the EP service.  She apparently has an effective junctional escape rhythm and permanent pacing was not felt to be indicated at this time.   I  spent 30 minutes counseling the patient face to face.   Myron G. Roddenberry, PA-C  12/31/2023 10:10 AM  Agree with above Her case was discussed in cath conference, and she does not have any PCI options.  She is high risk of post-operative complications, and likely will require SNF placement following the operation.  She states that she does not want to proceed with surgery at this point.  Her husband would like to speak with her and their daughters more prior to making a final decision.  Kamariyah Timberlake Ala Alice

## 2023-12-31 NOTE — Heart Team MDD (Signed)
   Heart Team Multi-Disciplinary Discussion  Patient: Kelly Gibson  DOB: [birth year]  MRN: [id number]   Date: 12/31/2023  11:35 AM    Attendees: Interventional Cardiology: Alyssa Backbone, MD Randene Bustard, MD Fransico Ivy, MD Knox Perl, MD Peter Swazil[place], MD Antionette Kirks, MD Sammy Crisp, MD Burney Carter, MD  Cardiothoracic Surgery: Adair Hollingshead, MD Starleen Eastern, MD     Patient History: 63 yo female with recent admission for NSTEMI, E. coli bacteremia and intermittent heart block, with obstructing kidney stone. Troponin is markedly elevated into the 15,000's.  Outpatient nuclear stress testing revealed significant reversible anterior ischemia. Now with with chest pain, nausea and vomiting.   Risk Factors: Hypertension COPD Chronic Kidney Disease Hyperlipidemia History of Stroke or TIA Diabetes Mellitus  AV block, obesity, NSTEMI     Review of Prior Angiography and PCI Procedures: Left heart cath and coronary angiography from 12/28/2023 images were reviewed and discussed in detail including: LM: No significant disease, LAD: Moderate sized vessel. Mid vessel occlusion with moderate calcification, after a large septal branch. Distal vessel filled with collaterals from septal branches, Lcx: Moderate sized vessel. Small OM1 with prox 70% stenosis. Medium caliber OM2 with prox occlusion. Distal vessel filled with collaterals from septal branches, RCA: Small to medium caliber dominant vessel with prox diffuse 50% disease. Mid vessel occlusion. Distal RCA/RPDA filled with collaterals from septal branches      Discussion: After presentation, consideration of treatment options occurred contrasting PCI versus referral to CTS for possible CABG. Discussion surrounded concern for CKD, need for urosurgery, elevated creatinine, comorbidities, and if there were any reasonable targets for CABG. After much consideration, the consensus from the team was for  referral to CTS for possible CABG.    Recommendations: CABG      Andreas Kays, RN  12/31/2023 11:35 AM

## 2023-12-31 NOTE — Telephone Encounter (Signed)
Pt is currently in hospital

## 2023-12-31 NOTE — Care Management Obs Status (Signed)
 MEDICARE OBSERVATION STATUS NOTIFICATION   Patient Details  Name: Kelly Gibson MRN: [id number] Date of Birth: January 30, 1961   Medicare Observation Status Notification Given:  Yes    Savannah Morford 12/31/2023, 10:59 AM

## 2023-12-31 NOTE — Progress Notes (Signed)
 DAILY PROGRESS NOTE   Patient Name: Kelly Gibson Date of Encounter: 12/31/2023 Cardiologist: Hazle Lites, MD  Chief Complaint   No chest pain  Patient Profile   63 yo female with recent admission for NSTEMI, E. coli bacteremia and intermittent heart block, mild to have an obstructing kidney stone.  Troponin is markedly elevated into the 15,000's.  She underwent outpatient nuclear stress testing for cardiac risk assessment and had significant reversible anterior ischemia.  She had outpatient cardiac catheterization but no intervention although bypass grafting is considered.  She now presents back 2 days later with chest pain, nausea and vomiting.   Subjective   Creatinine worse overnight- now 2.24.  Potassium 3.4 today. UA is bland, respiratory viral panel is negative. Trop was 132 and 140.  Cardiology team meeting today recommended CT surgery evaluation for CABG, although, she is not in condition for surgery at this point. CXR suggests possible multifocal pneumonia or pulmonary edema - chest CT recommended. BNP is elevated, recent echo showed LVEF 50-55%.  Objective   Vitals:   12/30/23 2340 12/31/23 0351 12/31/23 0515 12/31/23 0733  BP: (!) 117/51 (!) 127/53  137/62  Pulse: 61 62    Resp: 20 20  19   Temp:  98.3 F (36.8 C)  98.9 F (37.2 C)  TempSrc:  Oral  Oral  SpO2: 95% 93%    Weight:   87.1 kg   Height:        Intake/Output Summary (Last 24 hours) at 12/31/2023 0834 Last data filed at 12/31/2023 0736 Gross per 24 hour  Intake 360 ml  Output 1600 ml  Net -1240 ml   Filed Weights   12/31/23 0515  Weight: 87.1 kg    Physical Exam   General appearance: alert and no distress Lungs: clear to auscultation bilaterally Heart: regular rate and rhythm Extremities: extremities normal, atraumatic, no cyanosis or edema Neurologic: Grossly normal  Inpatient Medications    Scheduled Meds:  aspirin  EC  81 mg Oral Daily   budesonide -glycopyrrolate -formoterol   2  puff Inhalation BID   citalopram   10 mg Oral Daily   heparin   5,000 Units Subcutaneous Q8H   hydrALAZINE   100 mg Oral BID   insulin  aspart  0-9 Units Subcutaneous TID WC   insulin  glargine-yfgn  5 Units Subcutaneous BID   melatonin  10 mg Oral QHS   rosuvastatin   40 mg Oral QHS    Continuous Infusions:   PRN Meds: acetaminophen , albuterol , nitroGLYCERIN , ondansetron  (ZOFRAN ) IV   Labs   Results for orders placed or performed during the hospital encounter of 12/30/23 (from the past 48 hours)  Basic metabolic panel     Status: Abnormal   Collection Time: 12/30/23 10:36 AM  Result Value Ref Range   Sodium 140 135 - 145 mmol/L   Potassium 4.3 3.5 - 5.1 mmol/L   Chloride 107 98 - 111 mmol/L   CO2 21 (L) 22 - 32 mmol/L   Glucose, Bld 160 (H) 70 - 99 mg/dL    Comment: Glucose reference range applies only to samples taken after fasting for at least 8 hours.   BUN 21 8 - 23 mg/dL   Creatinine, Ser 6.96 (H) 0.44 - 1.00 mg/dL   Calcium  9.5 8.9 - 10.3 mg/dL   GFR, Estimated 26 (L) >60 mL/min    Comment: (NOTE) Calculated using the CKD-EPI Creatinine Equation (2021)    Anion gap 12 5 - 15    Comment: Performed at [facility], [address] [place], [place] [address]  CBC     Status: Abnormal   Collection Time: 12/30/23 10:36 AM  Result Value Ref Range   WBC 6.8 4.0 - 10.5 K/uL   RBC 4.32 3.87 - 5.11 MIL/uL   Hemoglobin 11.8 (L) 12.0 - 15.0 g/dL   HCT 25.4 27.0 - 62.3 %   MCV 90.5 80.0 - 100.0 fL   MCH 27.3 26.0 - 34.0 pg   MCHC 30.2 30.0 - 36.0 g/dL   RDW 76.2 (H) 83.1 - 51.7 %   Platelets 315 150 - 400 K/uL   nRBC 0.0 0.0 - 0.2 %    Comment: Performed at [facility], [address], [place], [place] [address]  Troponin I (High Sensitivity)     Status: Abnormal   Collection Time: 12/30/23 10:36 AM  Result Value Ref Range   Troponin I (High Sensitivity) 132 (HH) <18 ng/L    Comment: CRITICAL RESULT CALLED TO, READ BACK BY AND VERIFIED WITH CHAPLIN,C RN 1151  12/30/23 AMIREHSANIF (NOTE) Elevated high sensitivity troponin I (hsTnI) values and significant  changes across serial measurements may suggest ACS but many other  chronic and acute conditions are known to elevate hsTnI results.  Refer to the "Links" section for chest pain algorithms and additional  guidance. Performed at [facility], [address], [place], [place] [address]   Troponin I (High Sensitivity)     Status: Abnormal   Collection Time: 12/30/23  2:00 PM  Result Value Ref Range   Troponin I (High Sensitivity) 140 (HH) <18 ng/L    Comment: CRITICAL VALUE NOTED. VALUE IS CONSISTENT WITH PREVIOUSLY REPORTED/CALLED VALUE (NOTE) Elevated high sensitivity troponin I (hsTnI) values and significant  changes across serial measurements may suggest ACS but many other  chronic and acute conditions are known to elevate hsTnI results.  Refer to the "Links" section for chest pain algorithms and additional  guidance. Performed at [facility], [address], [place], [place] [address]   Brain natriuretic peptide     Status: Abnormal   Collection Time: 12/30/23  2:00 PM  Result Value Ref Range   B Natriuretic Peptide 691.1 (H) 0.0 - 100.0 pg/mL    Comment: Performed at [facility], [address], [place], [place] [address]  Resp panel by RT-PCR (RSV, Flu A&B, Covid) Anterior Nasal Swab     Status: None   Collection Time: 12/30/23  2:11 PM   Specimen: Anterior Nasal Swab  Result Value Ref Range   SARS Coronavirus 2 by RT PCR NEGATIVE NEGATIVE   Influenza A by PCR NEGATIVE NEGATIVE   Influenza B by PCR NEGATIVE NEGATIVE    Comment: (NOTE) The Xpert Xpress SARS-CoV-2/FLU/RSV plus assay is intended as an aid in the diagnosis of influenza from Nasopharyngeal swab specimens and should not be used as a sole basis for treatment. Nasal washings and aspirates are unacceptable for Xpert Xpress SARS-CoV-2/FLU/RSV testing.  Fact Sheet for  Patients: BloggerCourse.com  Fact Sheet for Healthcare Providers: SeriousBroker.it  This test is not yet approved or cleared by the [place]  FDA and has been authorized for detection and/or diagnosis of SARS-CoV-2 by FDA under an Emergency Use Authorization (EUA). This EUA will remain in effect (meaning this test can be used) for the duration of the COVID-19 declaration under Section 564(b)(1) of the Act, 21 U.S.C. section 360bbb-3(b)(1), unless the authorization is terminated or revoked.     Resp Syncytial Virus by PCR NEGATIVE NEGATIVE  Comment: (NOTE) Fact Sheet for Patients: BloggerCourse.com  Fact Sheet for Healthcare Providers: SeriousBroker.it  This test is not yet approved or cleared by the [place]  FDA and has been authorized for detection and/or diagnosis of SARS-CoV-2 by FDA under an Emergency Use Authorization (EUA). This EUA will remain in effect (meaning this test can be used) for the duration of the COVID-19 declaration under Section 564(b)(1) of the Act, 21 U.S.C. section 360bbb-3(b)(1), unless the authorization is terminated or revoked.  Performed at [facility], [address] [place], [place] [address]   MRSA Next Gen by PCR, Nasal     Status: None   Collection Time: 12/30/23  6:17 PM   Specimen: Nasal Mucosa; Nasal Swab  Result Value Ref Range   MRSA by PCR Next Gen NOT DETECTED NOT DETECTED    Comment: (NOTE) The GeneXpert MRSA Assay (FDA approved for NASAL specimens only), is one component of a comprehensive MRSA colonization surveillance program. It is not intended to diagnose MRSA infection nor to guide or monitor treatment for MRSA infections. Test performance is not FDA approved in patients less than 35 years old. Performed at [facility], [address], [place], [place] [address]   Urinalysis, Routine w reflex  microscopic -Urine, Catheterized     Status: None   Collection Time: 12/30/23  9:00 PM  Result Value Ref Range   Color, Urine YELLOW YELLOW   APPearance CLEAR CLEAR   Specific Gravity, Urine 1.006 1.005 - 1.030   pH 6.0 5.0 - 8.0   Glucose, UA NEGATIVE NEGATIVE mg/dL   Hgb urine dipstick NEGATIVE NEGATIVE   Bilirubin Urine NEGATIVE NEGATIVE   Ketones, ur NEGATIVE NEGATIVE mg/dL   Protein, ur NEGATIVE NEGATIVE mg/dL   Nitrite NEGATIVE NEGATIVE   Leukocytes,Ua NEGATIVE NEGATIVE    Comment: Performed at [facility], [address], [place], [place] [address]  Glucose, capillary     Status: Abnormal   Collection Time: 12/30/23  9:50 PM  Result Value Ref Range   Glucose-Capillary 164 (H) 70 - 99 mg/dL    Comment: Glucose reference range applies only to samples taken after fasting for at least 8 hours.   Comment 1 Notify RN   Basic metabolic panel     Status: Abnormal   Collection Time: 12/31/23  2:31 AM  Result Value Ref Range   Sodium 139 135 - 145 mmol/L   Potassium 3.4 (L) 3.5 - 5.1 mmol/L   Chloride 103 98 - 111 mmol/L   CO2 23 22 - 32 mmol/L   Glucose, Bld 118 (H) 70 - 99 mg/dL    Comment: Glucose reference range applies only to samples taken after fasting for at least 8 hours.   BUN 18 8 - 23 mg/dL   Creatinine, Ser 2.13 (H) 0.44 - 1.00 mg/dL   Calcium  8.9 8.9 - 10.3 mg/dL   GFR, Estimated 24 (L) >60 mL/min    Comment: (NOTE) Calculated using the CKD-EPI Creatinine Equation (2021)    Anion gap 13 5 - 15    Comment: Performed at [facility], [address] [place], [place] [address]  CBC     Status: Abnormal   Collection Time: 12/31/23  2:31 AM  Result Value Ref Range   WBC 5.6 4.0 - 10.5 K/uL   RBC 4.24 3.87 - 5.11 MIL/uL   Hemoglobin 11.4 (L) 12.0 - 15.0 g/dL   HCT 84.6 96.2 - 95.2 %   MCV 86.8 80.0 -  100.0 fL   MCH 26.9 26.0 - 34.0 pg   MCHC 31.0 30.0 - 36.0 g/dL   RDW 21.3 (H) 08.6 - 57.8 %   Platelets 316 150 - 400 K/uL   nRBC 0.0 0.0 - 0.2 %     Comment: Performed at [facility], [address], [place], [place] [address]  Glucose, capillary     Status: Abnormal   Collection Time: 12/31/23  6:31 AM  Result Value Ref Range   Glucose-Capillary 102 (H) 70 - 99 mg/dL    Comment: Glucose reference range applies only to samples taken after fasting for at least 8 hours.    ECG   Wandering atrial pacemaker - Personally Reviewed  Telemetry   Wandering atrial pacemaker- Personally Reviewed  Radiology    DG Chest 2 View Result Date: 12/30/2023 CLINICAL DATA:  cp EXAM: CHEST - 2 VIEW COMPARISON:  Dec 12, 2023 FINDINGS: Diffuse interstitial opacities throughout the lungs. Patchy airspace opacities in the right suprahilar upper lobe and the left lung, mostly in the left lower lobe. No pleural effusion or pneumothorax. Mild cardiomegaly. Aortic atherosclerosis. No acute fracture or destructive lesion. Multilevel thoracic osteophytosis. IMPRESSION: Cardiomegaly with findings of either asymmetric pulmonary edema or multifocal pneumonia. Given the persistence of the masslike consolidation in the left lower lobe on the prior CT of the abdomen and pelvis, a follow-up chest CT with IV contrast is recommended. Electronically Signed   By: Rance Burrows M.D.   On: 12/30/2023 11:26    Cardiac Studies   N/A  Assessment   Principal Problem:   Unstable angina (HCC) Active Problems:   Type 2 diabetes mellitus with diabetic neuropathy, with long-term current use of insulin  (HCC)   COPD (chronic obstructive pulmonary disease) (HCC)   Hemiparesis affecting right side as late effect of cerebrovascular accident (CVA) (HCC)   Dysphagia, post-stroke   Chronic kidney disease (CKD), stage III (moderate) (HCC)   Hyperlipidemia associated with type 2 diabetes mellitus (HCC)   Hypertension associated with diabetes (HCC)   Major depression in remission (HCC)   Heart block AV complete (HCC)   Coronary artery calcification   Sick sinus syndrome  [facility])   Plan   Ms. Ahola presents with worsening dyspnea and exertional chest pain after cath 2 days ago - troponin is much lower than recent NSTEMI level and flat - no current chest pain. AKI notable with prior renal calculi - will obtain renal ultrasound today to r/o hydronephrosis. CXR also suggested pulmonary edema vs multifocal pneumonia - LLL consolidation. Will obtain non-contrast chest CT today. Replete potassium. Give lasix  40 mg IV X 1.  Time Spent Directly with Patient:  I have spent a total of 35 minutes with the patient reviewing hospital notes, telemetry, EKGs, labs and examining the patient as well as establishing an assessment and plan that was discussed personally with the patient.  > 50% of time was spent in direct patient care.  Length of Stay:  LOS: 0 days   Hazle Lites, MD, [facility], FNLA, FACP  [place]  [facility]  Medical Director of the Advanced Lipid Disorders &  Cardiovascular Risk Reduction Clinic Diplomate of the American Board of Clinical Lipidology Attending Cardiologist  Direct Dial: [phone]  Fax: [phone]  Website:  [email or web address] 12/31/2023, 8:34 AM

## 2024-01-01 DIAGNOSIS — F325 Major depressive disorder, single episode, in full remission: Secondary | ICD-10-CM | POA: Diagnosis not present

## 2024-01-01 DIAGNOSIS — Z87891 Personal history of nicotine dependence: Secondary | ICD-10-CM

## 2024-01-01 DIAGNOSIS — I69351 Hemiplegia and hemiparesis following cerebral infarction affecting right dominant side: Secondary | ICD-10-CM | POA: Diagnosis not present

## 2024-01-01 DIAGNOSIS — R918 Other nonspecific abnormal finding of lung field: Secondary | ICD-10-CM | POA: Diagnosis not present

## 2024-01-01 DIAGNOSIS — E1159 Type 2 diabetes mellitus with other circulatory complications: Secondary | ICD-10-CM | POA: Diagnosis not present

## 2024-01-01 DIAGNOSIS — I69391 Dysphagia following cerebral infarction: Secondary | ICD-10-CM | POA: Diagnosis not present

## 2024-01-01 DIAGNOSIS — I200 Unstable angina: Secondary | ICD-10-CM | POA: Diagnosis not present

## 2024-01-01 DIAGNOSIS — J449 Chronic obstructive pulmonary disease, unspecified: Secondary | ICD-10-CM | POA: Diagnosis not present

## 2024-01-01 LAB — GLUCOSE, CAPILLARY
Glucose-Capillary: 142 mg/dL — ABNORMAL HIGH (ref 70–99)
Glucose-Capillary: 158 mg/dL — ABNORMAL HIGH (ref 70–99)
Glucose-Capillary: 177 mg/dL — ABNORMAL HIGH (ref 70–99)
Glucose-Capillary: 122 mg/dL — ABNORMAL HIGH (ref 70–99)

## 2024-01-01 LAB — URINE CULTURE: Culture: 20000 — AB

## 2024-01-01 MED ORDER — SODIUM CHLORIDE 0.9 % IV SOLN
1.0000 g | INTRAVENOUS | Status: DC
  Administered 2024-01-01 – 2024-01-04 (×4): 1 g via INTRAVENOUS
  Filled 2024-01-01 (×4): qty 10

## 2024-01-01 MED ORDER — AZITHROMYCIN 500 MG PO TABS
500.0000 mg | ORAL_TABLET | Freq: Every day | ORAL | Status: AC
  Administered 2024-01-01 – 2024-01-02 (×2): 500 mg via ORAL
  Filled 2024-01-01 (×2): qty 1

## 2024-01-01 NOTE — Progress Notes (Incomplete)
 Pt noted very confused tonight pulled IV's

## 2024-01-01 NOTE — Progress Notes (Signed)
 DAILY PROGRESS NOTE   Patient Name: LOELLA HICKLE Date of Encounter: 01/01/2024 Cardiologist: Hazle Lites, MD  Chief Complaint   No chest pain  Patient Profile   63 yo female with recent admission for NSTEMI, E. coli bacteremia and intermittent heart block, mild to have an obstructing kidney stone.  Troponin is markedly elevated into the 15,000's.  She underwent outpatient nuclear stress testing for cardiac risk assessment and had significant reversible anterior ischemia.  She had outpatient cardiac catheterization but no intervention although bypass grafting is considered.  She now presents back 2 days later with chest pain, nausea and vomiting.   Subjective   Eating breakfast no chest pain   Objective   Vitals:   12/31/23 2310 01/01/24 0305 01/01/24 0404 01/01/24 0500  BP: (!) 116/45  (!) 149/55   Pulse: 64  (!) 58   Resp: 18  15   Temp: 98.4 F (36.9 C)  98.2 F (36.8 C)   TempSrc: Oral (P) Oral Oral   SpO2: 97%  97%   Weight:    (P) 84.9 kg  Height:        Intake/Output Summary (Last 24 hours) at 01/01/2024 0748 Last data filed at 01/01/2024 0405 Gross per 24 hour  Intake 240 ml  Output 550 ml  Net -310 ml   Filed Weights   12/31/23 0515 01/01/24 0500  Weight: 87.1 kg (P) 84.9 kg    Physical Exam   Overweight black female Lungs clear No murmur  Right sided weakness RUE> RLE from prior stroke Abdomen benign Trace edema  Inpatient Medications    Scheduled Meds:  aspirin  EC  81 mg Oral Daily   budesonide -glycopyrrolate -formoterol   2 puff Inhalation BID   citalopram   10 mg Oral Daily   heparin   5,000 Units Subcutaneous Q8H   hydrALAZINE   100 mg Oral BID   insulin  aspart  0-9 Units Subcutaneous TID WC   insulin  glargine-yfgn  5 Units Subcutaneous BID   melatonin  10 mg Oral QHS   rosuvastatin   40 mg Oral QHS    Continuous Infusions:   PRN Meds: acetaminophen , albuterol , nitroGLYCERIN , ondansetron  (ZOFRAN ) IV   Labs   Results for  orders placed or performed during the hospital encounter of 12/30/23 (from the past 48 hours)  Basic metabolic panel     Status: Abnormal   Collection Time: 12/30/23 10:36 AM  Result Value Ref Range   Sodium 140 135 - 145 mmol/L   Potassium 4.3 3.5 - 5.1 mmol/L   Chloride 107 98 - 111 mmol/L   CO2 21 (L) 22 - 32 mmol/L   Glucose, Bld 160 (H) 70 - 99 mg/dL    Comment: Glucose reference range applies only to samples taken after fasting for at least 8 hours.   BUN 21 8 - 23 mg/dL   Creatinine, Ser 1.61 (H) 0.44 - 1.00 mg/dL   Calcium  9.5 8.9 - 10.3 mg/dL   GFR, Estimated 26 (L) >60 mL/min    Comment: (NOTE) Calculated using the CKD-EPI Creatinine Equation (2021)    Anion gap 12 5 - 15    Comment: Performed at [facility], [address], [place], [place] [address]  CBC     Status: Abnormal   Collection Time: 12/30/23 10:36 AM  Result Value Ref Range   WBC 6.8 4.0 - 10.5 K/uL   RBC 4.32 3.87 - 5.11 MIL/uL   Hemoglobin 11.8 (L) 12.0 - 15.0 g/dL   HCT 54.0 98.1 -  46.0 %   MCV 90.5 80.0 - 100.0 fL   MCH 27.3 26.0 - 34.0 pg   MCHC 30.2 30.0 - 36.0 g/dL   RDW 24.4 (H) 01.0 - 27.2 %   Platelets 315 150 - 400 K/uL   nRBC 0.0 0.0 - 0.2 %    Comment: Performed at [facility], [address] [place], [place] [address]  Troponin I (High Sensitivity)     Status: Abnormal   Collection Time: 12/30/23 10:36 AM  Result Value Ref Range   Troponin I (High Sensitivity) 132 (HH) <18 ng/L    Comment: CRITICAL RESULT CALLED TO, READ BACK BY AND VERIFIED WITH CHAPLIN,C RN 1151 12/30/23 AMIREHSANIF (NOTE) Elevated high sensitivity troponin I (hsTnI) values and significant  changes across serial measurements may suggest ACS but many other  chronic and acute conditions are known to elevate hsTnI results.  Refer to the "Links" section for chest pain algorithms and additional  guidance. Performed at [facility], [address], [place], [place] [address]   Troponin I (High  Sensitivity)     Status: Abnormal   Collection Time: 12/30/23  2:00 PM  Result Value Ref Range   Troponin I (High Sensitivity) 140 (HH) <18 ng/L    Comment: CRITICAL VALUE NOTED. VALUE IS CONSISTENT WITH PREVIOUSLY REPORTED/CALLED VALUE (NOTE) Elevated high sensitivity troponin I (hsTnI) values and significant  changes across serial measurements may suggest ACS but many other  chronic and acute conditions are known to elevate hsTnI results.  Refer to the "Links" section for chest pain algorithms and additional  guidance. Performed at [facility], [address] [place], [place] [address]   Brain natriuretic peptide     Status: Abnormal   Collection Time: 12/30/23  2:00 PM  Result Value Ref Range   B Natriuretic Peptide 691.1 (H) 0.0 - 100.0 pg/mL    Comment: Performed at [facility], [address] [place], [place] [address]  Resp panel by RT-PCR (RSV, Flu A&B, Covid) Anterior Nasal Swab     Status: None   Collection Time: 12/30/23  2:11 PM   Specimen: Anterior Nasal Swab  Result Value Ref Range   SARS Coronavirus 2 by RT PCR NEGATIVE NEGATIVE   Influenza A by PCR NEGATIVE NEGATIVE   Influenza B by PCR NEGATIVE NEGATIVE    Comment: (NOTE) The Xpert Xpress SARS-CoV-2/FLU/RSV plus assay is intended as an aid in the diagnosis of influenza from Nasopharyngeal swab specimens and should not be used as a sole basis for treatment. Nasal washings and aspirates are unacceptable for Xpert Xpress SARS-CoV-2/FLU/RSV testing.  Fact Sheet for Patients: BloggerCourse.com  Fact Sheet for Healthcare Providers: SeriousBroker.it  This test is not yet approved or cleared by the [place]  FDA and has been authorized for detection and/or diagnosis of SARS-CoV-2 by FDA under an Emergency Use Authorization (EUA). This EUA will remain in effect (meaning this test can be used) for the duration of the COVID-19 declaration under  Section 564(b)(1) of the Act, 21 U.S.C. section 360bbb-3(b)(1), unless the authorization is terminated or revoked.     Resp Syncytial Virus by PCR NEGATIVE NEGATIVE    Comment: (NOTE) Fact Sheet for Patients: BloggerCourse.com  Fact Sheet for Healthcare Providers: SeriousBroker.it  This test is not yet approved or cleared by the [place]  FDA and has been authorized for detection and/or diagnosis of SARS-CoV-2 by FDA under an Emergency Use Authorization (EUA). This EUA will remain in effect (meaning this  test can be used) for the duration of the COVID-19 declaration under Section 564(b)(1) of the Act, 21 U.S.C. section 360bbb-3(b)(1), unless the authorization is terminated or revoked.  Performed at [facility], [address], [place], [place] [address]   Glucose, capillary     Status: Abnormal   Collection Time: 12/30/23  5:03 PM  Result Value Ref Range   Glucose-Capillary 100 (H) 70 - 99 mg/dL    Comment: Glucose reference range applies only to samples taken after fasting for at least 8 hours.  MRSA Next Gen by PCR, Nasal     Status: None   Collection Time: 12/30/23  6:17 PM   Specimen: Nasal Mucosa; Nasal Swab  Result Value Ref Range   MRSA by PCR Next Gen NOT DETECTED NOT DETECTED    Comment: (NOTE) The GeneXpert MRSA Assay (FDA approved for NASAL specimens only), is one component of a comprehensive MRSA colonization surveillance program. It is not intended to diagnose MRSA infection nor to guide or monitor treatment for MRSA infections. Test performance is not FDA approved in patients less than 65 years old. Performed at [facility], [address], [place], [place] [address]   Urinalysis, Routine w reflex microscopic -Urine, Catheterized     Status: None   Collection Time: 12/30/23  9:00 PM  Result Value Ref Range   Color, Urine YELLOW YELLOW   APPearance CLEAR CLEAR   Specific Gravity, Urine  1.006 1.005 - 1.030   pH 6.0 5.0 - 8.0   Glucose, UA NEGATIVE NEGATIVE mg/dL   Hgb urine dipstick NEGATIVE NEGATIVE   Bilirubin Urine NEGATIVE NEGATIVE   Ketones, ur NEGATIVE NEGATIVE mg/dL   Protein, ur NEGATIVE NEGATIVE mg/dL   Nitrite NEGATIVE NEGATIVE   Leukocytes,Ua NEGATIVE NEGATIVE    Comment: Performed at [facility], [address], [place], [place] [address]  Urine Culture (for pregnant, neutropenic or urologic patients or patients with an indwelling urinary catheter)     Status: Abnormal (Preliminary result)   Collection Time: 12/30/23  9:00 PM   Specimen: Urine, Clean Catch  Result Value Ref Range   Specimen Description URINE, CLEAN CATCH    Special Requests NONE    Culture (A)     20,000 COLONIES/mL GRAM POSITIVE COCCI IDENTIFICATION AND SUSCEPTIBILITIES TO FOLLOW Performed at [name], [address], [place], [place] [address]    Report Status PENDING   Glucose, capillary     Status: Abnormal   Collection Time: 12/30/23  9:50 PM  Result Value Ref Range   Glucose-Capillary 164 (H) 70 - 99 mg/dL    Comment: Glucose reference range applies only to samples taken after fasting for at least 8 hours.   Comment 1 Notify RN   Basic metabolic panel     Status: Abnormal   Collection Time: 12/31/23  2:31 AM  Result Value Ref Range   Sodium 139 135 - 145 mmol/L   Potassium 3.4 (L) 3.5 - 5.1 mmol/L   Chloride 103 98 - 111 mmol/L   CO2 23 22 - 32 mmol/L   Glucose, Bld 118 (H) 70 - 99 mg/dL    Comment: Glucose reference range applies only to samples taken after fasting for at least 8 hours.   BUN 18 8 - 23 mg/dL   Creatinine, Ser 2.95 (H) 0.44 - 1.00 mg/dL   Calcium  8.9 8.9 - 10.3 mg/dL   GFR, Estimated 24 (L) >60 mL/min    Comment: (NOTE) Calculated using  the CKD-EPI Creatinine Equation (2021)    Anion gap 13 5 - 15    Comment: Performed at [facility], [address], [place], [place] [address]  CBC     Status: Abnormal   Collection Time:  12/31/23  2:31 AM  Result Value Ref Range   WBC 5.6 4.0 - 10.5 K/uL   RBC 4.24 3.87 - 5.11 MIL/uL   Hemoglobin 11.4 (L) 12.0 - 15.0 g/dL   HCT 10.2 72.5 - 36.6 %   MCV 86.8 80.0 - 100.0 fL   MCH 26.9 26.0 - 34.0 pg   MCHC 31.0 30.0 - 36.0 g/dL   RDW 44.0 (H) 34.7 - 42.5 %   Platelets 316 150 - 400 K/uL   nRBC 0.0 0.0 - 0.2 %    Comment: Performed at [facility], [address], [place], [place] [address]  Glucose, capillary     Status: Abnormal   Collection Time: 12/31/23  6:31 AM  Result Value Ref Range   Glucose-Capillary 102 (H) 70 - 99 mg/dL    Comment: Glucose reference range applies only to samples taken after fasting for at least 8 hours.  Glucose, capillary     Status: Abnormal   Collection Time: 12/31/23 12:07 PM  Result Value Ref Range   Glucose-Capillary 152 (H) 70 - 99 mg/dL    Comment: Glucose reference range applies only to samples taken after fasting for at least 8 hours.  Glucose, capillary     Status: Abnormal   Collection Time: 12/31/23  5:07 PM  Result Value Ref Range   Glucose-Capillary 228 (H) 70 - 99 mg/dL    Comment: Glucose reference range applies only to samples taken after fasting for at least 8 hours.  Glucose, capillary     Status: Abnormal   Collection Time: 12/31/23  9:05 PM  Result Value Ref Range   Glucose-Capillary 197 (H) 70 - 99 mg/dL    Comment: Glucose reference range applies only to samples taken after fasting for at least 8 hours.  Glucose, capillary     Status: Abnormal   Collection Time: 01/01/24  6:18 AM  Result Value Ref Range   Glucose-Capillary 142 (H) 70 - 99 mg/dL    Comment: Glucose reference range applies only to samples taken after fasting for at least 8 hours.    ECG   Wandering atrial pacemaker - Personally Reviewed  Telemetry   Wandering atrial pacemaker- Personally Reviewed  Radiology    US  RENAL Result Date: 12/31/2023 CLINICAL DATA:  LEFT renal calculus EXAM: RENAL / URINARY TRACT ULTRASOUND COMPLETE  COMPARISON:  Renal stone protocol CT 12/12/2023 FINDINGS: Right Kidney: Renal measurements: 6.8 x 3.6 x 3.1 cm = volume: 36 mL. Echogenicity within normal limits. No mass or hydronephrosis visualized. Left Kidney: Renal measurements: 9.9 x 6.1 x 3.8 cm = volume: 119 mL. Echogenicity within normal limits. No hydronephrosis. Nonobstructing calculi again seen within the renal pelvis measuring 11 mm and within the lower pole calyx measuring 9 mm. Bladder: Appears normal for degree of bladder distention. Other: None. IMPRESSION: 1. No hydronephrosis. 2. Nonobstructing calculi seen within the LEFT renal pelvis and lower pole calyx. Electronically Signed   By: Elester Grim M.D.   On: 12/31/2023 16:31   CT CHEST WO CONTRAST Result Date: 12/31/2023 CLINICAL DATA:  Pulmonary hypertension, respiratory illness, shortness of breath, left lower lobe consolidation * Tracking Code: BO * EXAM: CT CHEST WITHOUT CONTRAST TECHNIQUE: Multidetector CT imaging of the chest was performed  following the standard protocol without IV contrast. RADIATION DOSE REDUCTION: This exam was performed according to the departmental dose-optimization program which includes automated exposure control, adjustment of the mA and/or kV according to patient size and/or use of iterative reconstruction technique. COMPARISON:  Chest radiograph, 12/30/2023 FINDINGS: Cardiovascular: Aortic atherosclerosis. Normal heart size. Three-vessel coronary artery calcifications. No pericardial effusion. Mediastinum/Nodes: Prominent mediastinal lymph nodes, pretracheal nodes measuring up to 1.4 x 1.4 cm. Thyroid gland, trachea, and esophagus demonstrate no significant findings. Lungs/Pleura: Dense, masslike consolidation of the dependent left lower lobe measuring up to 6.1 x 3.9 cm with internal air bronchograms (series 4, image 93). Mild interlobular septal thickening in the lung bases. Small left pleural effusion. Scattered areas of bandlike scarring in both lung bases.  Upper Abdomen: No acute abnormality. Musculoskeletal: No chest wall abnormality. No acute osseous findings. IMPRESSION: 1. Dense, masslike consolidation of the dependent left lower lobe measuring up to 6.1 x 3.9 cm with internal air bronchograms. This may reflect infection or aspiration but is generally somewhat worrisome for malignancy. Recommend short interval follow-up to ensure resolution. 2. Mild interlobular septal thickening in the lung bases, consistent with mild pulmonary edema. 3. Small left pleural effusion. 4. Prominent mediastinal lymph nodes, likely reactive. Attention on follow-up. 5. Coronary artery disease. Aortic Atherosclerosis (ICD10-I70.0). Electronically Signed   By: Fredricka Jenny M.D.   On: 12/31/2023 11:57   DG Chest 2 View Result Date: 12/30/2023 CLINICAL DATA:  cp EXAM: CHEST - 2 VIEW COMPARISON:  Dec 12, 2023 FINDINGS: Diffuse interstitial opacities throughout the lungs. Patchy airspace opacities in the right suprahilar upper lobe and the left lung, mostly in the left lower lobe. No pleural effusion or pneumothorax. Mild cardiomegaly. Aortic atherosclerosis. No acute fracture or destructive lesion. Multilevel thoracic osteophytosis. IMPRESSION: Cardiomegaly with findings of either asymmetric pulmonary edema or multifocal pneumonia. Given the persistence of the masslike consolidation in the left lower lobe on the prior CT of the abdomen and pelvis, a follow-up chest CT with IV contrast is recommended. Electronically Signed   By: Rance Burrows M.D.   On: 12/30/2023 11:26    Cardiac Studies   Echo done 12/12/23 EF 50-55% valves ok  Cath 12/28/23     Extremely complex situation in this patient with multiple comorbidities including prior stroke with right-sided hemiparesis, diabetes.  Her shortness of breath is new since her recent hospitalization when she had non-STEMI in the setting of urosepsis due to staghorn calculus that requires definitive surgery at some point by urology,  pending cardiac risk stratification and optimization.  Echocardiogram showed inferior akinesis but preserved LVEF around 55%.  Stress test showed no infarct, but anterior ischemia with stress EF 44%.  Coronary angiogram today shows 3 distinct occlusions of mid LAD, proximal OM 2, mid RCA with faint collateralization. In addition, she has at least isorhythmic A-V dissociation, if not intermittent complete AV block on EKG.  I suspect this is also ischemic in etiology.  She is hemodynamically stable at this time.   Ideally, her coronary anatomy is suitable for surgical revascularization.  However, multiple comorbidities could limit surgical candidacy.  I would like to discuss her case in multidisciplinary heart team meeting for further decision-making.   I will obtain a twelve-lead EKG.  She had similar appearance on EKG last week in the office as well.  She is hemodynamically stable from her A-V dissociation.  I will consider discharging home on a monitor, versus inpatient EP consult, after obtaining the EKG.   Manish Corliss Dies,  MD  Assessment   Principal Problem:   Unstable angina (HCC) Active Problems:   Type 2 diabetes mellitus with diabetic neuropathy, with long-term current use of insulin  (HCC)   COPD (chronic obstructive pulmonary disease) (HCC)   Hemiparesis affecting right side as late effect of cerebrovascular accident (CVA) (HCC)   Dysphagia, post-stroke   Chronic kidney disease (CKD), stage III (moderate) (HCC)   Hyperlipidemia associated with type 2 diabetes mellitus (HCC)   Hypertension associated with diabetes (HCC)   Major depression in remission (HCC)   Heart block AV complete (HCC)   Coronary artery calcification   Sick sinus syndrome (HCC)   Plan   Severe 3 vessel CAD. EF mildly depressed No good percutaneous options Waiting formal CVTS consult but given prior stroke, renal calculi with infection COPD poorly controlled DM suspect she will not be a candiate for CABG at  this time.  Renal:  non obstructing left renal pelvis stone needs antibiotics no hydronephrosis  Cr 2.24 Pulmonary:  CT with dense masslike consolidation in LLL with air bronchograms likely pneumonia w/u and antibiotics per primary service consider pulmonary consult   Janelle Mediate MD [facility]  Janelle Mediate 01/01/2024, 7:48 AM

## 2024-01-01 NOTE — H&P (View-Only) (Signed)
 NAME:  Kelly Gibson, MRN:  [id number], DOB:  [birth year], LOS: 0 ADMISSION DATE:  12/30/2023, CONSULTATION DATE:  01/01/2024  REFERRING MD:  Weldon Hales, CHIEF COMPLAINT: Abnormal imaging  History of Present Illness:  63 year old ex-smoker whom we are asked to consult on due to persistent left lower lobe infiltrate. She had a prolonged hospitalization at an outside hospital in [place]  in 11/2023 for E. coli bacteremia and non-STEMI with peak troponin more than 60K.  Echo showed wall motion abnormality, she also had bradycardia arrhythmias including complete heart block and junctional bradycardia.  CT chest was negative for PE but showed left basilar consolidation.  She was found to have left hydronephrosis with left renal pelvis 1.6 cm stone. After transfer to [facility], she underwent CT renal stone study on 5/14 which showed masslike consolidation in the left lower lobe with enlarged mediastinal and bilateral hilar lymph nodes felt to be reactive.  She was treated with cefazolin  and discharged on cefadroxil  for 15 days with plans for definitive urologic procedure on 6/4 She was seen by cardiology and diagnostic cardiac cath was performed which showed three-vessel disease with LVEDP 18 mm .  While in recovery she was noted to have a choking episode while eating a sandwich.  She was evaluated by EP due to sinus pauses and found to have a wandering atrial pacemaker and it was felt that she should be treated for cardiac ischemia and neurological procedure first. She was hospitalized 6/5 due to recurrent and remittent chest pain since catheterization Chest x-ray showed left lower lobe infiltrate again.  Wheezing was noted on exam CT chest without contrast again showed dense masslike consolidation in the left lower lobe 6.1 x 3.9 cm with small pleural effusion and reactive mediastinal lymph nodes PCCM consulted   Pertinent  Medical History  hypertension, hyperlipidemia,  Diabetes-2,   CVA 2019 with residual RT hemiparesis and dysphagia,  AV block,  CKD 3,  3V CAD,  depression,  COPD   Significant Hospital Events: Including procedures, antibiotic start and stop dates in addition to other pertinent events     Interim History / Subjective:  Complains of shortness of breath Chest pain: Resolved Complains of sadness of mood  Objective    Blood pressure (!) 127/49, pulse 63, temperature 98.5 F (36.9 C), temperature source Oral, resp. rate 17, height 5\' 3"  (1.6 m), weight (P) 84.9 kg, last menstrual period 12/10/2012, SpO2 95%.        Intake/Output Summary (Last 24 hours) at 01/01/2024 1525 Last data filed at 01/01/2024 0405 Gross per 24 hour  Intake 240 ml  Output 550 ml  Net -310 ml   Filed Weights   12/31/23 0515 01/01/24 0500  Weight: 87.1 kg (P) 84.9 kg    Examination: Gen. Pleasant, obese, in no distress, normal affect ENT - no pallor,icterus, no post nasal drip, class 2 airway Neck: No JVD, no thyromegaly, no carotid bruits Lungs: no use of accessory muscles, no dullness to percussion, decreased without rales or rhonchi  Cardiovascular: Rhythm regular, heart sounds  normal, no murmurs or gallops, no peripheral edema Abdomen: soft and non-tender, no hepatosplenomegaly, BS normal. Musculoskeletal: No deformities, no cyanosis or clubbing Neuro:  alert, right hemiplegia  Labs show mild hypokalemia, no leukocytosis Urine culture shows 20K staph epi  Resolved problem list   Assessment and Plan   Persistent left lower lobe infiltrate first noted May 2025 during hospitalization in [place]  for E. coli bacteremia.  Of note, chest x-ray from October  2024 shows left lower lobe alveolar process She was a 1 pack/day smoker until she quit in 2017, about 20 pack years .  Concern is for malignancy Differential of course is recurrent aspiration.  She has been on regular diet even though she was advised on a dysphagia diet after her stroke and she clearly  provides a history of excessive aspiration and choking on her food  -Would suggest that we proceed with inspection bronchoscopy if she is willing  Risks and benefits of the procedure were discussed with the patient - Dysphagia diet , swallow evaluation  Three-vessel CAD -she is awaiting the CTS opinion  Renal calculus with hydronephrosis -she is awaiting definitive urological procedure after cardiac clearance  COPD -continue Breztri  and albuterol       Best Practice (right click and "Reselect all SmartList Selections" daily)   Code Status:  full code Last date of multidisciplinary goals of care discussion [NA]  Labs   CBC: Recent Labs  Lab 12/30/23 1036 12/31/23 0231  WBC 6.8 5.6  HGB 11.8* 11.4*  HCT 39.1 36.8  MCV 90.5 86.8  PLT 315 316    Basic Metabolic Panel: Recent Labs  Lab 12/30/23 1036 12/31/23 0231  NA 140 139  K 4.3 3.4*  CL 107 103  CO2 21* 23  GLUCOSE 160* 118*  BUN 21 18  CREATININE 2.12* 2.24*  CALCIUM  9.5 8.9   GFR: Estimated Creatinine Clearance: 27.6 mL/min (A) (by C-G formula based on SCr of 2.24 mg/dL (H)). Recent Labs  Lab 12/30/23 1036 12/31/23 0231  WBC 6.8 5.6    Liver Function Tests: No results for input(s): "AST", "ALT", "ALKPHOS", "BILITOT", "PROT", "ALBUMIN" in the last 168 hours. No results for input(s): "LIPASE", "AMYLASE" in the last 168 hours. No results for input(s): "AMMONIA" in the last 168 hours.  ABG    Component Value Date/Time   TCO2 26 12/05/2017 1251     Coagulation Profile: No results for input(s): "INR", "PROTIME" in the last 168 hours.  Cardiac Enzymes: No results for input(s): "CKTOTAL", "CKMB", "CKMBINDEX", "TROPONINI" in the last 168 hours.  HbA1C: Hgb A1C (fingerstick)  Date/Time Value Ref Range Status  02/18/2015 09:54 AM 8.1 (H) <5.7 % Final    Comment:                                                                           According to the ADA Clinical Practice Recommendations for  2011, when HbA1c is used as a screening test:     >=6.5%   Diagnostic of Diabetes Mellitus            (if abnormal result is confirmed)   5.7-6.4%   Increased risk of developing Diabetes Mellitus   References:Diagnosis and Classification of Diabetes Mellitus,Diabetes Care,2011,34(Suppl 1):S62-S69 and Standards of Medical Care in         Diabetes - 2011,Diabetes Care,2011,34 (Suppl 1):S11-S61.      Hgb A1c MFr Bld  Date/Time Value Ref Range Status  12/11/2023 06:30 PM 8.0 (H) 4.8 - 5.6 % Final    Comment:    (NOTE) Pre diabetes:          5.7%-6.4%  Diabetes:              >  6.4%  Glycemic control for   <7.0% adults with diabetes   10/05/2022 02:08 PM 8.2 (H) 4.8 - 5.6 % Final    Comment:             Prediabetes: 5.7 - 6.4          Diabetes: >6.4          Glycemic control for adults with diabetes: <7.0     CBG: Recent Labs  Lab 12/31/23 1207 12/31/23 1707 12/31/23 2105 01/01/24 0618 01/01/24 1119  GLUCAP 152* 228* 197* 142* 158*    Review of Systems:   Intermittent chest pain Shortness of breath Sadness of mood    Past Medical History:  She,  has a past medical history of Bradycardia, Chronic kidney disease, COPD (chronic obstructive pulmonary disease) (HCC), Depression, Depression with anxiety (08/29/2021), Diabetes mellitus, Dysarthria, post-stroke, Dysrhythmia, Ganglion cyst (03/09/2012), History of kidney stones, Hyperlipidemia, Hypertension, Hypokalemia, Myocardial infarction (HCC), Obesity, unspecified (12/17/2011), Pneumonia, Pneumonia of left lower lobe due to infectious organism (12/12/2023), PONV (postoperative nausea and vomiting), Skin lesion of face (11/17/2022), and Stroke (HCC).   Surgical History:   Past Surgical History:  Procedure Laterality Date   CESAREAN SECTION     x 3   LEFT HEART CATH AND CORONARY ANGIOGRAPHY N/A 12/28/2023   Procedure: LEFT HEART CATH AND CORONARY ANGIOGRAPHY;  Surgeon: Cody Das, MD;  Location: [facility] INVASIVE  CV LAB;  Service: Cardiovascular;  Laterality: N/A;   ORIF WRIST FRACTURE Right 05/04/2018   Procedure: OPEN REDUCTION INTERNAL FIXATION (ORIF) RIGHT  WRIST FRACTURE;  Surgeon: Micheline Ahr, MD;  Location: [facility] OR;  Service: Orthopedics;  Laterality: Right;   TUMOR REMOVAL     left shoulder     Social History:   reports that she has quit smoking. Her smoking use included cigarettes. She has a 20 pack-year smoking history. She has never used smokeless tobacco. She reports that she does not drink alcohol and does not use drugs.   Family History:  Her family history includes Cancer in her father; Diabetes in her mother; Heart disease in her father; Stroke in her father and mother.   Allergies Allergies  Allergen Reactions   Latex Itching and Rash   Niacin Nausea And Vomiting     Home Medications  Prior to Admission medications   Medication Sig Start Date End Date Taking? Authorizing Provider  albuterol  (VENTOLIN  HFA) 108 (90 Base) MCG/ACT inhaler Inhale 2 puffs into the lungs every 6 (six) hours as needed for wheezing or shortness of breath. 12/14/23  Yes Etter Hermann., MD  aspirin  EC 81 MG tablet Take 1 tablet (81 mg total) by mouth daily. Swallow whole. 12/15/23  Yes Etter Hermann., MD  budesonide -glycopyrrolate -formoterol  (BREZTRI  AEROSPHERE) 160-9-4.8 MCG/ACT AERO inhaler Inhale 2 puffs into the lungs 2 (two) times daily. 12/14/23  Yes Etter Hermann., MD  citalopram  (CELEXA ) 10 MG tablet TAKE 1 TABLET BY MOUTH DAILY 06/28/23  Yes Early, Sara E, NP  Continuous Glucose Sensor (DEXCOM G7 SENSOR) MISC Take 1 Device by mouth See admin instructions. Change sensor every 10 days   Yes [provider]  Doxylamine Succinate, Sleep, (UNISOM PO) Take 2 each by mouth at bedtime. Gel capsules   Yes [provider]  furosemide  (LASIX ) 20 MG tablet Take 1 tablet (20 mg total) by mouth daily as needed. Patient taking differently: Take 20 mg by mouth daily as needed  for edema or fluid. 10/05/22  Yes Early, Sara E,  NP  HUMALOG  KWIKPEN 100 UNIT/ML KwikPen INJECT 3 TO 5 UNITS INTO THE SKIN DAILY BEFORE LUNCH Patient taking differently: Inject 2-10 Units into the skin with breakfast, with lunch, and with evening meal. 11/29/23  Yes Early, Adriane Albe, NP  hydrALAZINE  (APRESOLINE ) 100 MG tablet Take 1 tablet (100 mg total) by mouth 2 (two) times daily. 12/28/23  Yes Leala Prince, PA-C  insulin  degludec (TRESIBA  FLEXTOUCH) 100 UNIT/ML FlexTouch Pen Inject 10 Units into the skin 2 (two) times daily. Adjust by 2 units every 3 days as needed for blood sugars higher than 150 in the morning fasting. Patient taking differently: Inject 10-22 Units into the skin 2 (two) times daily. Take 22 units in the morning and take 10 units at night. Adjust by 2 units every 3 days as needed for blood sugars higher than 150 in the morning fasting. 10/05/22  Yes Early, Sara E, NP  Melatonin 5 MG CHEW Chew 10 mg by mouth at bedtime.   Yes [provider]  Multiple Vitamins-Minerals (MULTIVITAMIN GUMMIES WOMENS) CHEW Chew 2 each by mouth in the morning.   Yes [provider]  rosuvastatin  (CRESTOR ) 40 MG tablet TAKE 1 TABLET BY MOUTH DAILY Patient taking differently: Take 40 mg by mouth at bedtime. 04/08/23  Yes Early, Sara E, NP  tiZANidine  (ZANAFLEX ) 2 MG tablet TAKE 1-2 TABLETS UP TO TWICE A DAY  AS NEEDED FOR MUSCLE SPASMS Patient taking differently: Take 2 mg by mouth at bedtime. 05/11/23  Yes Early, Adriane Albe, NP  UNABLE TO FIND Take 1 tablet by mouth with breakfast, with lunch, and with evening meal. GlucoGold -Berberine, Concentrated Cinnamon, Chromium, Banaba Leaf Extract   Yes [provider]  amLODipine  (NORVASC ) 5 MG tablet Take 1 tablet (5 mg total) by mouth daily. Patient not taking: Reported on 12/23/2023 05/12/23   Early, Sara E, NP  olmesartan (BENICAR) 40 MG tablet Take 40 mg by mouth in the morning. Patient not taking: Reported on 12/23/2023 05/12/23    [provider]  TRADJENTA  5 MG TABS tablet Take 5 mg by mouth daily. Patient not taking: Reported on 12/11/2023    [provider]      Celene Coins MD. FCCP. [facility] Pager : [phone]  If no response to pager , please call [phone] until 7 pm After 7:00 pm call Elink  [phone]   01/01/2024

## 2024-01-01 NOTE — Progress Notes (Signed)
 Progress Note   Patient: Kelly Gibson UJW:[id number] DOB: 1961/01/09 DOA: 12/30/2023     0 DOS: the patient was seen and examined on 01/01/2024   Brief hospital course: GLORIANN RIEDE is a 63 y.o. female with medical history significant of hypertension, hyperlipidemia, diabetes, CVA with residual hemiparesis and dysphagia, AV block, CKD 3, CAD, depression, COPD presenting with chest pain.   Patient underwent recent cardiac cath 12/28/23 for further workup of issues with shortness of breath and admission where she was found to have significant elevated troponin.  At that time she was experiencing sepsis and catheterization was not pursued but she did receive aspirin , Plavix , heparin .  There was concern there for intermittent complete heart block and as she recovered she requested transfer to [facility].  Course they are also complicated by a staghorn calculus with left nephrostomy tube having been placed.   Ultimately cardiac cath was deferred and patient underwent Myoview  on 5/22 showing high risk study and catheterization was performed 2 days ago.  Found to have three-vessel disease with 3 chronic total occlusions with collaterals.  Concerned that her AV block may be due to ischemia.  She was evaluated for possible pacemaker placement however she was noted to have wandering atrial pacemaker with good junctional rhythm and was asymptomatic so there was not felt to be currently an indication for pacemaker placement per EP.   Presenting today with recurrent/increased intermittent chest pain since her catheterization.  Has reported some nausea and vomiting as well.  Assessment and Plan: CAD Unstable angina versus NSTEMI Patient presented with increased intermittent chest pain with associated nausea and vomiting since catheterization 12/28/23. That time showed complex anatomy with three-vessel chronic total occlusion with some collateral blood flow. Troponin elevation currently 132 is lot less than  recent past. Cardiology and CT surgery on board further management. Monitor on progressive unit for now considering concern for ACS and at risk for high degree AV dissociation. Continue home aspirin  and rosuvastatin    Left lower lobe density Pneumonia vs malignancy Persistent left lower lobe masslike consolidation on chest x-ray. CT chest with out contrast showed mass like density 6.1 x 3.9 cm ?infiltrate vs malignancy. Started Rocephin + azithro. Pulmonology consulted for further management.   AKI on CKD 3 Creatinine currently elevated to 2.24 previous baseline was in the 1.5-1.6 range. Renal sono ruled out obstruction, has left renal pelvis non obstructing calculus. Continue to monitor daily renal function. Avoid nephrotoxic drugs.  Chronic diastolic CHF Volume overload Last LVEF 50-55%, G1 DD. Currently only prescribed Lasix  as needed but was told by cardiology recently to hold off on Lasix  per patient. Concerned that she may have some mild exacerbation of chronic diastolic CHF. Cardiology service advised IV Lasix  which she tolerated well.   History of staghorn calculus Renal uls showed - Left renal pelvis non obstructing stone, no hydro, Urinalysis unremarkable. Blood cultures negative. Outpatient urology follow up.   AV block Outpatient seen by cardiac electrophysiology who felt that she was likely having some degree of wandering atrial pacemaker. No acute need for pacemaker was noted.  Supportive care. Continue to hold any nodal blocking agents   Hypertension Continue home hydralazine    Diabetes 10-15 units twice daily long-acting at home with SSI 15 unit long-acting twice daily Accuchecks, sliding scale insulin  per floor protocol.   History of CVA History of dysphagia post CVA and also has residual right hemiparesis. Continue ASA, rosuvastatin    Depression Continue home Celexa    COPD Continue home Breztri  and  albuterol           Out of bed to chair.  Incentive spirometry. Nursing supportive care. Fall, aspiration precautions. Diet:  Diet Orders (From admission, onward)     Start     Ordered   12/30/23 1454  Diet heart healthy/carb modified Room service appropriate? Yes; Fluid consistency: Thin  Diet effective now       Question Answer Comment  Diet-HS Snack? Nothing   Room service appropriate? Yes   Fluid consistency: Thin      12/30/23 1453           DVT prophylaxis: heparin  injection 5,000 Units Start: 12/30/23 2200  Level of care: Progressive   Code Status: Full Code  Subjective: Patient is seen and examined today morning. She has shortness of breath, currently on 2L supplemental oxygen. She denies chest pain. Eating fair. Husband at bedside asked about her current care and plan.  Physical Exam: Vitals:   01/01/24 0500 01/01/24 0803 01/01/24 0921 01/01/24 1121  BP:  (!) 126/58 (!) 126/58 (!) 127/49  Pulse:  63  63  Resp:  19  17  Temp:  98.4 F (36.9 C)  98.5 F (36.9 C)  TempSrc:  Oral  Oral  SpO2:  95%  95%  Weight: (P) 84.9 kg     Height:        General - Middle aged [place] American female, no apparent distress HEENT - PERRLA, EOMI, atraumatic head, non tender sinuses. Lung - Clear, basal rales, rhonchi, no wheezes. Heart - S1, S2 heard, no murmurs, rubs, trace pedal edema. Abdomen - Soft, non tender, bowel sounds 3 Neuro - Alert, awake and oriented x 3, right sided weakness. Skin - Warm and dry.  Data Reviewed:      Latest Ref Rng & Units 12/31/2023    2:31 AM 12/30/2023   10:36 AM 12/14/2023    6:17 AM  CBC  WBC 4.0 - 10.5 K/uL 5.6  6.8  11.7   Hemoglobin 12.0 - 15.0 g/dL 96.2  95.2  84.1   Hematocrit 36.0 - 46.0 % 36.8  39.1  47.4   Platelets 150 - 400 K/uL 316  315  327       Latest Ref Rng & Units 12/31/2023    2:31 AM 12/30/2023   10:36 AM 12/14/2023    6:17 AM  BMP  Glucose 70 - 99 mg/dL 324  401  027   BUN 8 - 23 mg/dL 18  21  43   Creatinine 0.44 - 1.00 mg/dL 2.53  6.64  4.03   Sodium  135 - 145 mmol/L 139  140  141   Potassium 3.5 - 5.1 mmol/L 3.4  4.3  3.7   Chloride 98 - 111 mmol/L 103  107  97   CO2 22 - 32 mmol/L 23  21  28    Calcium  8.9 - 10.3 mg/dL 8.9  9.5  47.4    US  RENAL Result Date: 12/31/2023 CLINICAL DATA:  LEFT renal calculus EXAM: RENAL / URINARY TRACT ULTRASOUND COMPLETE COMPARISON:  Renal stone protocol CT 12/12/2023 FINDINGS: Right Kidney: Renal measurements: 6.8 x 3.6 x 3.1 cm = volume: 36 mL. Echogenicity within normal limits. No mass or hydronephrosis visualized. Left Kidney: Renal measurements: 9.9 x 6.1 x 3.8 cm = volume: 119 mL. Echogenicity within normal limits. No hydronephrosis. Nonobstructing calculi again seen within the renal pelvis measuring 11 mm and within the lower pole calyx measuring 9 mm. Bladder: Appears normal for degree of bladder distention.  Other: None. IMPRESSION: 1. No hydronephrosis. 2. Nonobstructing calculi seen within the LEFT renal pelvis and lower pole calyx. Electronically Signed   By: Elester Grim M.D.   On: 12/31/2023 16:31   CT CHEST WO CONTRAST Result Date: 12/31/2023 CLINICAL DATA:  Pulmonary hypertension, respiratory illness, shortness of breath, left lower lobe consolidation * Tracking Code: BO * EXAM: CT CHEST WITHOUT CONTRAST TECHNIQUE: Multidetector CT imaging of the chest was performed following the standard protocol without IV contrast. RADIATION DOSE REDUCTION: This exam was performed according to the departmental dose-optimization program which includes automated exposure control, adjustment of the mA and/or kV according to patient size and/or use of iterative reconstruction technique. COMPARISON:  Chest radiograph, 12/30/2023 FINDINGS: Cardiovascular: Aortic atherosclerosis. Normal heart size. Three-vessel coronary artery calcifications. No pericardial effusion. Mediastinum/Nodes: Prominent mediastinal lymph nodes, pretracheal nodes measuring up to 1.4 x 1.4 cm. Thyroid gland, trachea, and esophagus demonstrate no  significant findings. Lungs/Pleura: Dense, masslike consolidation of the dependent left lower lobe measuring up to 6.1 x 3.9 cm with internal air bronchograms (series 4, image 93). Mild interlobular septal thickening in the lung bases. Small left pleural effusion. Scattered areas of bandlike scarring in both lung bases. Upper Abdomen: No acute abnormality. Musculoskeletal: No chest wall abnormality. No acute osseous findings. IMPRESSION: 1. Dense, masslike consolidation of the dependent left lower lobe measuring up to 6.1 x 3.9 cm with internal air bronchograms. This may reflect infection or aspiration but is generally somewhat worrisome for malignancy. Recommend short interval follow-up to ensure resolution. 2. Mild interlobular septal thickening in the lung bases, consistent with mild pulmonary edema. 3. Small left pleural effusion. 4. Prominent mediastinal lymph nodes, likely reactive. Attention on follow-up. 5. Coronary artery disease. Aortic Atherosclerosis (ICD10-I70.0). Electronically Signed   By: Fredricka Jenny M.D.   On: 12/31/2023 11:57    Family Communication: Discussed with patient, husband. They understand and agree. All questions answered.  Disposition: Status is: Observation The patient remains OBS appropriate and will d/c before 2 midnights.  Planned Discharge Destination: Home with Home Health     Time spent: 40 minutes  Author: Aisha Hove, MD 01/01/2024 2:13 PM Secure chat 7am to 7pm For on call review [email or web address].

## 2024-01-01 NOTE — Plan of Care (Signed)
  Problem: Education: Goal: Ability to describe self-care measures that may prevent or decrease complications (Diabetes Survival Skills Education) will improve Outcome: Progressing Goal: Individualized Educational Video(s) Outcome: Progressing   Problem: Coping: Goal: Ability to adjust to condition or change in health will improve Outcome: Progressing   Problem: Fluid Volume: Goal: Ability to maintain a balanced intake and output will improve Outcome: Progressing   Problem: Health Behavior/Discharge Planning: Goal: Ability to identify and utilize available resources and services will improve Outcome: Progressing Goal: Ability to manage health-related needs will improve Outcome: Progressing   Problem: Metabolic: Goal: Ability to maintain appropriate glucose levels will improve Outcome: Progressing   Problem: Nutritional: Goal: Maintenance of adequate nutrition will improve Outcome: Progressing Goal: Progress toward achieving an optimal weight will improve Outcome: Progressing   Problem: Skin Integrity: Goal: Risk for impaired skin integrity will decrease Outcome: Progressing   Problem: Tissue Perfusion: Goal: Adequacy of tissue perfusion will improve Outcome: Progressing   Problem: Education: Goal: Understanding of cardiac disease, CV risk reduction, and recovery process will improve Outcome: Progressing Goal: Individualized Educational Video(s) Outcome: Progressing   Problem: Activity: Goal: Ability to tolerate increased activity will improve Outcome: Progressing   Problem: Cardiac: Goal: Ability to achieve and maintain adequate cardiovascular perfusion will improve Outcome: Progressing   Problem: Health Behavior/Discharge Planning: Goal: Ability to safely manage health-related needs after discharge will improve Outcome: Progressing   Problem: Education: Goal: Knowledge of General Education information will improve Description: Including pain rating scale,  medication(s)/side effects and non-pharmacologic comfort measures Outcome: Progressing   Problem: Health Behavior/Discharge Planning: Goal: Ability to manage health-related needs will improve Outcome: Progressing   Problem: Clinical Measurements: Goal: Ability to maintain clinical measurements within normal limits will improve Outcome: Progressing Goal: Will remain free from infection Outcome: Progressing Goal: Diagnostic test results will improve Outcome: Progressing Goal: Respiratory complications will improve Outcome: Progressing Goal: Cardiovascular complication will be avoided Outcome: Progressing   Problem: Activity: Goal: Risk for activity intolerance will decrease Outcome: Progressing   Problem: Nutrition: Goal: Adequate nutrition will be maintained Outcome: Progressing   Problem: Coping: Goal: Level of anxiety will decrease Outcome: Progressing   Problem: Elimination: Goal: Will not experience complications related to bowel motility Outcome: Progressing Goal: Will not experience complications related to urinary retention Outcome: Progressing   Problem: Pain Managment: Goal: General experience of comfort will improve and/or be controlled Outcome: Progressing

## 2024-01-01 NOTE — Consult Note (Signed)
 NAME:  Kelly Gibson, MRN:  [id number], DOB:  [birth year], LOS: 0 ADMISSION DATE:  12/30/2023, CONSULTATION DATE:  01/01/2024  REFERRING MD:  Weldon Hales, CHIEF COMPLAINT: Abnormal imaging  History of Present Illness:  63 year old ex-smoker whom we are asked to consult on due to persistent left lower lobe infiltrate. She had a prolonged hospitalization at an outside hospital in [place]  in 11/2023 for E. coli bacteremia and non-STEMI with peak troponin more than 60K.  Echo showed wall motion abnormality, she also had bradycardia arrhythmias including complete heart block and junctional bradycardia.  CT chest was negative for PE but showed left basilar consolidation.  She was found to have left hydronephrosis with left renal pelvis 1.6 cm stone. After transfer to [facility], she underwent CT renal stone study on 5/14 which showed masslike consolidation in the left lower lobe with enlarged mediastinal and bilateral hilar lymph nodes felt to be reactive.  She was treated with cefazolin  and discharged on cefadroxil  for 15 days with plans for definitive urologic procedure on 6/4 She was seen by cardiology and diagnostic cardiac cath was performed which showed three-vessel disease with LVEDP 18 mm .  While in recovery she was noted to have a choking episode while eating a sandwich.  She was evaluated by EP due to sinus pauses and found to have a wandering atrial pacemaker and it was felt that she should be treated for cardiac ischemia and neurological procedure first. She was hospitalized 6/5 due to recurrent and remittent chest pain since catheterization Chest x-ray showed left lower lobe infiltrate again.  Wheezing was noted on exam CT chest without contrast again showed dense masslike consolidation in the left lower lobe 6.1 x 3.9 cm with small pleural effusion and reactive mediastinal lymph nodes PCCM consulted   Pertinent  Medical History  hypertension, hyperlipidemia,  Diabetes-2,   CVA 2019 with residual RT hemiparesis and dysphagia,  AV block,  CKD 3,  3V CAD,  depression,  COPD   Significant Hospital Events: Including procedures, antibiotic start and stop dates in addition to other pertinent events     Interim History / Subjective:  Complains of shortness of breath Chest pain: Resolved Complains of sadness of mood  Objective    Blood pressure (!) 127/49, pulse 63, temperature 98.5 F (36.9 C), temperature source Oral, resp. rate 17, height 5\' 3"  (1.6 m), weight (P) 84.9 kg, last menstrual period 12/10/2012, SpO2 95%.        Intake/Output Summary (Last 24 hours) at 01/01/2024 1525 Last data filed at 01/01/2024 0405 Gross per 24 hour  Intake 240 ml  Output 550 ml  Net -310 ml   Filed Weights   12/31/23 0515 01/01/24 0500  Weight: 87.1 kg (P) 84.9 kg    Examination: Gen. Pleasant, obese, in no distress, normal affect ENT - no pallor,icterus, no post nasal drip, class 2 airway Neck: No JVD, no thyromegaly, no carotid bruits Lungs: no use of accessory muscles, no dullness to percussion, decreased without rales or rhonchi  Cardiovascular: Rhythm regular, heart sounds  normal, no murmurs or gallops, no peripheral edema Abdomen: soft and non-tender, no hepatosplenomegaly, BS normal. Musculoskeletal: No deformities, no cyanosis or clubbing Neuro:  alert, right hemiplegia  Labs show mild hypokalemia, no leukocytosis Urine culture shows 20K staph epi  Resolved problem list   Assessment and Plan   Persistent left lower lobe infiltrate first noted May 2025 during hospitalization in [place]  for E. coli bacteremia.  Of note, chest x-ray from October  2024 shows left lower lobe alveolar process She was a 1 pack/day smoker until she quit in 2017, about 20 pack years .  Concern is for malignancy Differential of course is recurrent aspiration.  She has been on regular diet even though she was advised on a dysphagia diet after her stroke and she clearly  provides a history of excessive aspiration and choking on her food  -Would suggest that we proceed with inspection bronchoscopy if she is willing  Risks and benefits of the procedure were discussed with the patient - Dysphagia diet , swallow evaluation  Three-vessel CAD -she is awaiting the CTS opinion  Renal calculus with hydronephrosis -she is awaiting definitive urological procedure after cardiac clearance  COPD -continue Breztri  and albuterol       Best Practice (right click and "Reselect all SmartList Selections" daily)   Code Status:  full code Last date of multidisciplinary goals of care discussion [NA]  Labs   CBC: Recent Labs  Lab 12/30/23 1036 12/31/23 0231  WBC 6.8 5.6  HGB 11.8* 11.4*  HCT 39.1 36.8  MCV 90.5 86.8  PLT 315 316    Basic Metabolic Panel: Recent Labs  Lab 12/30/23 1036 12/31/23 0231  NA 140 139  K 4.3 3.4*  CL 107 103  CO2 21* 23  GLUCOSE 160* 118*  BUN 21 18  CREATININE 2.12* 2.24*  CALCIUM  9.5 8.9   GFR: Estimated Creatinine Clearance: 27.6 mL/min (A) (by C-G formula based on SCr of 2.24 mg/dL (H)). Recent Labs  Lab 12/30/23 1036 12/31/23 0231  WBC 6.8 5.6    Liver Function Tests: No results for input(s): "AST", "ALT", "ALKPHOS", "BILITOT", "PROT", "ALBUMIN" in the last 168 hours. No results for input(s): "LIPASE", "AMYLASE" in the last 168 hours. No results for input(s): "AMMONIA" in the last 168 hours.  ABG    Component Value Date/Time   TCO2 26 12/05/2017 1251     Coagulation Profile: No results for input(s): "INR", "PROTIME" in the last 168 hours.  Cardiac Enzymes: No results for input(s): "CKTOTAL", "CKMB", "CKMBINDEX", "TROPONINI" in the last 168 hours.  HbA1C: Hgb A1C (fingerstick)  Date/Time Value Ref Range Status  02/18/2015 09:54 AM 8.1 (H) <5.7 % Final    Comment:                                                                           According to the ADA Clinical Practice Recommendations for  2011, when HbA1c is used as a screening test:     >=6.5%   Diagnostic of Diabetes Mellitus            (if abnormal result is confirmed)   5.7-6.4%   Increased risk of developing Diabetes Mellitus   References:Diagnosis and Classification of Diabetes Mellitus,Diabetes Care,2011,34(Suppl 1):S62-S69 and Standards of Medical Care in         Diabetes - 2011,Diabetes Care,2011,34 (Suppl 1):S11-S61.      Hgb A1c MFr Bld  Date/Time Value Ref Range Status  12/11/2023 06:30 PM 8.0 (H) 4.8 - 5.6 % Final    Comment:    (NOTE) Pre diabetes:          5.7%-6.4%  Diabetes:              >  6.4%  Glycemic control for   <7.0% adults with diabetes   10/05/2022 02:08 PM 8.2 (H) 4.8 - 5.6 % Final    Comment:             Prediabetes: 5.7 - 6.4          Diabetes: >6.4          Glycemic control for adults with diabetes: <7.0     CBG: Recent Labs  Lab 12/31/23 1207 12/31/23 1707 12/31/23 2105 01/01/24 0618 01/01/24 1119  GLUCAP 152* 228* 197* 142* 158*    Review of Systems:   Intermittent chest pain Shortness of breath Sadness of mood    Past Medical History:  She,  has a past medical history of Bradycardia, Chronic kidney disease, COPD (chronic obstructive pulmonary disease) (HCC), Depression, Depression with anxiety (08/29/2021), Diabetes mellitus, Dysarthria, post-stroke, Dysrhythmia, Ganglion cyst (03/09/2012), History of kidney stones, Hyperlipidemia, Hypertension, Hypokalemia, Myocardial infarction (HCC), Obesity, unspecified (12/17/2011), Pneumonia, Pneumonia of left lower lobe due to infectious organism (12/12/2023), PONV (postoperative nausea and vomiting), Skin lesion of face (11/17/2022), and Stroke (HCC).   Surgical History:   Past Surgical History:  Procedure Laterality Date   CESAREAN SECTION     x 3   LEFT HEART CATH AND CORONARY ANGIOGRAPHY N/A 12/28/2023   Procedure: LEFT HEART CATH AND CORONARY ANGIOGRAPHY;  Surgeon: Cody Das, MD;  Location: [facility] INVASIVE  [facility];  Service: Cardiovascular;  Laterality: N/A;   ORIF WRIST FRACTURE Right 05/04/2018   Procedure: OPEN REDUCTION INTERNAL FIXATION (ORIF) RIGHT  WRIST FRACTURE;  Surgeon: Micheline Ahr, MD;  Location: [facility] OR;  Service: Orthopedics;  Laterality: Right;   TUMOR REMOVAL     left shoulder     Social History:   reports that she has quit smoking. Her smoking use included cigarettes. She has a 20 pack-year smoking history. She has never used smokeless tobacco. She reports that she does not drink alcohol and does not use drugs.   Family History:  Her family history includes Cancer in her father; Diabetes in her mother; Heart disease in her father; Stroke in her father and mother.   Allergies Allergies  Allergen Reactions   Latex Itching and Rash   Niacin Nausea And Vomiting     Home Medications  Prior to Admission medications   Medication Sig Start Date End Date Taking? Authorizing Provider  albuterol  (VENTOLIN  HFA) 108 (90 Base) MCG/ACT inhaler Inhale 2 puffs into the lungs every 6 (six) hours as needed for wheezing or shortness of breath. 12/14/23  Yes Etter Hermann., MD  aspirin  EC 81 MG tablet Take 1 tablet (81 mg total) by mouth daily. Swallow whole. 12/15/23  Yes Etter Hermann., MD  budesonide -glycopyrrolate -formoterol  (BREZTRI  AEROSPHERE) 160-9-4.8 MCG/ACT AERO inhaler Inhale 2 puffs into the lungs 2 (two) times daily. 12/14/23  Yes Etter Hermann., MD  citalopram  (CELEXA ) 10 MG tablet TAKE 1 TABLET BY MOUTH DAILY 06/28/23  Yes Early, Sara E, NP  Continuous Glucose Sensor (DEXCOM G7 SENSOR) MISC Take 1 Device by mouth See admin instructions. Change sensor every 10 days   Yes [provider]  Doxylamine Succinate, Sleep, (UNISOM PO) Take 2 each by mouth at bedtime. Gel capsules   Yes [provider]  furosemide  (LASIX ) 20 MG tablet Take 1 tablet (20 mg total) by mouth daily as needed. Patient taking differently: Take 20 mg by mouth daily as needed  for edema or fluid. 10/05/22  Yes Early, Sara E,  NP  HUMALOG  KWIKPEN 100 UNIT/ML KwikPen INJECT 3 TO 5 UNITS INTO THE SKIN DAILY BEFORE LUNCH Patient taking differently: Inject 2-10 Units into the skin with breakfast, with lunch, and with evening meal. 11/29/23  Yes Early, Adriane Albe, NP  hydrALAZINE  (APRESOLINE ) 100 MG tablet Take 1 tablet (100 mg total) by mouth 2 (two) times daily. 12/28/23  Yes Leala Prince, PA-C  insulin  degludec (TRESIBA  FLEXTOUCH) 100 UNIT/ML FlexTouch Pen Inject 10 Units into the skin 2 (two) times daily. Adjust by 2 units every 3 days as needed for blood sugars higher than 150 in the morning fasting. Patient taking differently: Inject 10-22 Units into the skin 2 (two) times daily. Take 22 units in the morning and take 10 units at night. Adjust by 2 units every 3 days as needed for blood sugars higher than 150 in the morning fasting. 10/05/22  Yes Early, Sara E, NP  Melatonin 5 MG CHEW Chew 10 mg by mouth at bedtime.   Yes [provider]  Multiple Vitamins-Minerals (MULTIVITAMIN GUMMIES WOMENS) CHEW Chew 2 each by mouth in the morning.   Yes [provider]  rosuvastatin  (CRESTOR ) 40 MG tablet TAKE 1 TABLET BY MOUTH DAILY Patient taking differently: Take 40 mg by mouth at bedtime. 04/08/23  Yes Early, Sara E, NP  tiZANidine  (ZANAFLEX ) 2 MG tablet TAKE 1-2 TABLETS UP TO TWICE A DAY  AS NEEDED FOR MUSCLE SPASMS Patient taking differently: Take 2 mg by mouth at bedtime. 05/11/23  Yes Early, Adriane Albe, NP  UNABLE TO FIND Take 1 tablet by mouth with breakfast, with lunch, and with evening meal. GlucoGold -Berberine, Concentrated Cinnamon, Chromium, Banaba Leaf Extract   Yes [provider]  amLODipine  (NORVASC ) 5 MG tablet Take 1 tablet (5 mg total) by mouth daily. Patient not taking: Reported on 12/23/2023 05/12/23   Early, Sara E, NP  olmesartan (BENICAR) 40 MG tablet Take 40 mg by mouth in the morning. Patient not taking: Reported on 12/23/2023 05/12/23    [provider]  TRADJENTA  5 MG TABS tablet Take 5 mg by mouth daily. Patient not taking: Reported on 12/11/2023    [provider]      Celene Coins MD. FCCP. [facility] Pager : [phone]  If no response to pager , please call [phone] until 7 pm After 7:00 pm call Elink  [phone]   01/01/2024

## 2024-01-02 ENCOUNTER — Observation Stay (HOSPITAL_COMMUNITY)

## 2024-01-02 DIAGNOSIS — J181 Lobar pneumonia, unspecified organism: Secondary | ICD-10-CM

## 2024-01-02 DIAGNOSIS — F325 Major depressive disorder, single episode, in full remission: Secondary | ICD-10-CM | POA: Diagnosis not present

## 2024-01-02 DIAGNOSIS — I69391 Dysphagia following cerebral infarction: Secondary | ICD-10-CM | POA: Diagnosis not present

## 2024-01-02 DIAGNOSIS — E1159 Type 2 diabetes mellitus with other circulatory complications: Secondary | ICD-10-CM | POA: Diagnosis not present

## 2024-01-02 DIAGNOSIS — I200 Unstable angina: Secondary | ICD-10-CM | POA: Diagnosis not present

## 2024-01-02 DIAGNOSIS — I69351 Hemiplegia and hemiparesis following cerebral infarction affecting right dominant side: Secondary | ICD-10-CM | POA: Diagnosis not present

## 2024-01-02 LAB — BASIC METABOLIC PANEL WITH GFR
Anion gap: 9 (ref 5–15)
BUN: 21 mg/dL (ref 8–23)
CO2: 24 mmol/L (ref 22–32)
Calcium: 8.7 mg/dL — ABNORMAL LOW (ref 8.9–10.3)
Chloride: 104 mmol/L (ref 98–111)
Creatinine, Ser: 1.93 mg/dL — ABNORMAL HIGH (ref 0.44–1.00)
GFR, Estimated: 29 mL/min — ABNORMAL LOW (ref >60)
Glucose, Bld: 115 mg/dL — ABNORMAL HIGH (ref 70–99)
Potassium: 3.8 mmol/L (ref 3.5–5.1)
Sodium: 137 mmol/L (ref 135–145)

## 2024-01-02 LAB — CBC
HCT: 35.6 % — ABNORMAL LOW (ref 36.0–46.0)
Hemoglobin: 11.1 g/dL — ABNORMAL LOW (ref 12.0–15.0)
MCH: 27.1 pg (ref 26.0–34.0)
MCHC: 31.2 g/dL (ref 30.0–36.0)
MCV: 87 fL (ref 80.0–100.0)
Platelets: 293 10*3/uL (ref 150–400)
RBC: 4.09 MIL/uL (ref 3.87–5.11)
RDW: 15.7 % — ABNORMAL HIGH (ref 11.5–15.5)
WBC: 6.5 10*3/uL (ref 4.0–10.5)
nRBC: 0 % (ref 0.0–0.2)

## 2024-01-02 LAB — GLUCOSE, CAPILLARY
Glucose-Capillary: 120 mg/dL — ABNORMAL HIGH (ref 70–99)
Glucose-Capillary: 211 mg/dL — ABNORMAL HIGH (ref 70–99)
Glucose-Capillary: 183 mg/dL — ABNORMAL HIGH (ref 70–99)
Glucose-Capillary: 290 mg/dL — ABNORMAL HIGH (ref 70–99)

## 2024-01-02 NOTE — Progress Notes (Signed)
 SLP Cancellation Note  Patient Details Name: Kelly Gibson MRN: [id number] DOB: May 10, 1961   Cancelled treatment:       Reason Eval/Treat Not Completed: Other (comment). Unfortunately, will be unlikely that MBS can be complete today due to radiology scheduling. Will plan for next date after bronchoscopy as schedule allows.   Delsa Fife MA, [name]    Kelly Gibson [name] 01/02/2024, 11:51 AM

## 2024-01-02 NOTE — Progress Notes (Signed)
 Received orders for repeat swallow evaluation. Recommendations for a regular diet, thin liquids made following bedside swallow eval on 12/28/23. Repeat bedside exam unlikely to yield different results. In light of need to r/o chronic aspiration, recommend proceeding with instrumental study via MBS. Attempted to schedule for today.   Kolbi Altadonna MA, [name]

## 2024-01-02 NOTE — Progress Notes (Signed)
 DAILY PROGRESS NOTE   Patient Name: Kelly Gibson Date of Encounter: 01/02/2024 Cardiologist: Hazle Lites, MD  Chief Complaint   No chest pain  Patient Profile   63 yo female with recent admission for NSTEMI, E. coli bacteremia and intermittent heart block, mild to have an obstructing kidney stone.  Troponin is markedly elevated into the 15,000's.  She underwent outpatient nuclear stress testing for cardiac risk assessment and had significant reversible anterior ischemia.  She had outpatient cardiac catheterization but no intervention although bypass grafting is considered.  She now presents back 2 days later with chest pain, nausea and vomiting.   Subjective   Sitting in chair with no distress Discussed risk/benefit of bronchoscopy tomorrow   Objective   Vitals:   01/02/24 0309 01/02/24 0400 01/02/24 0600 01/02/24 0723  BP: (!) 122/57 (!) 129/53  (!) 137/56  Pulse: (!) 56 (!) 51  66  Resp: (!) 21 20  18   Temp: 98 F (36.7 C)   99.1 F (37.3 C)  TempSrc: Oral Oral  Oral  SpO2: 95% 97%  96%  Weight:   (P) 85.5 kg   Height:        Intake/Output Summary (Last 24 hours) at 01/02/2024 1037 Last data filed at 01/02/2024 0809 Gross per 24 hour  Intake 720 ml  Output 500 ml  Net 220 ml   Filed Weights   12/31/23 0515 01/01/24 0500 01/02/24 0600  Weight: 87.1 kg 84.9 kg (P) 85.5 kg    Physical Exam   Overweight black female Lungs clear No murmur  Right sided weakness RUE> RLE from prior stroke Abdomen benign Trace edema  Inpatient Medications    Scheduled Meds:  aspirin  EC  81 mg Oral Daily   azithromycin   500 mg Oral Daily   budesonide -glycopyrrolate -formoterol   2 puff Inhalation BID   citalopram   10 mg Oral Daily   heparin   5,000 Units Subcutaneous Q8H   hydrALAZINE   100 mg Oral BID   insulin  aspart  0-9 Units Subcutaneous TID WC   insulin  glargine-yfgn  5 Units Subcutaneous BID   melatonin  10 mg Oral QHS   rosuvastatin   40 mg Oral QHS     Continuous Infusions:  cefTRIAXone (ROCEPHIN)  IV 1 g (01/02/24 0854)    PRN Meds: acetaminophen , albuterol , nitroGLYCERIN , ondansetron  (ZOFRAN ) IV   Labs   Results for orders placed or performed during the hospital encounter of 12/30/23 (from the past 48 hours)  Glucose, capillary     Status: Abnormal   Collection Time: 12/31/23 12:07 PM  Result Value Ref Range   Glucose-Capillary 152 (H) 70 - 99 mg/dL    Comment: Glucose reference range applies only to samples taken after fasting for at least 8 hours.  Glucose, capillary     Status: Abnormal   Collection Time: 12/31/23  5:07 PM  Result Value Ref Range   Glucose-Capillary 228 (H) 70 - 99 mg/dL    Comment: Glucose reference range applies only to samples taken after fasting for at least 8 hours.  Glucose, capillary     Status: Abnormal   Collection Time: 12/31/23  9:05 PM  Result Value Ref Range   Glucose-Capillary 197 (H) 70 - 99 mg/dL    Comment: Glucose reference range applies only to samples taken after fasting for at least 8 hours.  Glucose, capillary     Status: Abnormal   Collection Time: 01/01/24  6:18 AM  Result Value Ref Range   Glucose-Capillary 142 (H) 70 -  99 mg/dL    Comment: Glucose reference range applies only to samples taken after fasting for at least 8 hours.  Glucose, capillary     Status: Abnormal   Collection Time: 01/01/24 11:19 AM  Result Value Ref Range   Glucose-Capillary 158 (H) 70 - 99 mg/dL    Comment: Glucose reference range applies only to samples taken after fasting for at least 8 hours.  Glucose, capillary     Status: Abnormal   Collection Time: 01/01/24  4:14 PM  Result Value Ref Range   Glucose-Capillary 177 (H) 70 - 99 mg/dL    Comment: Glucose reference range applies only to samples taken after fasting for at least 8 hours.  Glucose, capillary     Status: Abnormal   Collection Time: 01/01/24  9:23 PM  Result Value Ref Range   Glucose-Capillary 122 (H) 70 - 99 mg/dL    Comment:  Glucose reference range applies only to samples taken after fasting for at least 8 hours.  CBC     Status: Abnormal   Collection Time: 01/02/24  2:12 AM  Result Value Ref Range   WBC 6.5 4.0 - 10.5 K/uL   RBC 4.09 3.87 - 5.11 MIL/uL   Hemoglobin 11.1 (L) 12.0 - 15.0 g/dL   HCT 29.5 (L) 62.1 - 30.8 %   MCV 87.0 80.0 - 100.0 fL   MCH 27.1 26.0 - 34.0 pg   MCHC 31.2 30.0 - 36.0 g/dL   RDW 65.7 (H) 84.6 - 96.2 %   Platelets 293 150 - 400 K/uL   nRBC 0.0 0.0 - 0.2 %    Comment: Performed at [facility], [address], [place], [place] [address]  Basic metabolic panel with GFR     Status: Abnormal   Collection Time: 01/02/24  2:12 AM  Result Value Ref Range   Sodium 137 135 - 145 mmol/L   Potassium 3.8 3.5 - 5.1 mmol/L   Chloride 104 98 - 111 mmol/L   CO2 24 22 - 32 mmol/L   Glucose, Bld 115 (H) 70 - 99 mg/dL    Comment: Glucose reference range applies only to samples taken after fasting for at least 8 hours.   BUN 21 8 - 23 mg/dL   Creatinine, Ser 1.32 (H) 0.44 - 1.00 mg/dL   Calcium  8.7 (L) 8.9 - 10.3 mg/dL   GFR, Estimated 29 (L) >60 mL/min    Comment: (NOTE) Calculated using the CKD-EPI Creatinine Equation (2021)    Anion gap 9 5 - 15    Comment: Performed at [facility], [address] [place], [place] [address]  Glucose, capillary     Status: Abnormal   Collection Time: 01/02/24  6:14 AM  Result Value Ref Range   Glucose-Capillary 120 (H) 70 - 99 mg/dL    Comment: Glucose reference range applies only to samples taken after fasting for at least 8 hours.    ECG   Wandering atrial pacemaker - Personally Reviewed  Telemetry   Wandering atrial pacemaker- Personally Reviewed  Radiology    US  RENAL Result Date: 12/31/2023 CLINICAL DATA:  LEFT renal calculus EXAM: RENAL / URINARY TRACT ULTRASOUND COMPLETE COMPARISON:  Renal stone protocol CT 12/12/2023 FINDINGS: Right Kidney: Renal measurements: 6.8 x 3.6 x 3.1 cm = volume: 36 mL. Echogenicity within normal  limits. No mass or hydronephrosis visualized. Left Kidney: Renal measurements: 9.9 x 6.1 x 3.8 cm = volume: 119 mL. Echogenicity within normal limits. No hydronephrosis. Nonobstructing calculi again  seen within the renal pelvis measuring 11 mm and within the lower pole calyx measuring 9 mm. Bladder: Appears normal for degree of bladder distention. Other: None. IMPRESSION: 1. No hydronephrosis. 2. Nonobstructing calculi seen within the LEFT renal pelvis and lower pole calyx. Electronically Signed   By: Elester Grim M.D.   On: 12/31/2023 16:31   CT CHEST WO CONTRAST Result Date: 12/31/2023 CLINICAL DATA:  Pulmonary hypertension, respiratory illness, shortness of breath, left lower lobe consolidation * Tracking Code: BO * EXAM: CT CHEST WITHOUT CONTRAST TECHNIQUE: Multidetector CT imaging of the chest was performed following the standard protocol without IV contrast. RADIATION DOSE REDUCTION: This exam was performed according to the departmental dose-optimization program which includes automated exposure control, adjustment of the mA and/or kV according to patient size and/or use of iterative reconstruction technique. COMPARISON:  Chest radiograph, 12/30/2023 FINDINGS: Cardiovascular: Aortic atherosclerosis. Normal heart size. Three-vessel coronary artery calcifications. No pericardial effusion. Mediastinum/Nodes: Prominent mediastinal lymph nodes, pretracheal nodes measuring up to 1.4 x 1.4 cm. Thyroid gland, trachea, and esophagus demonstrate no significant findings. Lungs/Pleura: Dense, masslike consolidation of the dependent left lower lobe measuring up to 6.1 x 3.9 cm with internal air bronchograms (series 4, image 93). Mild interlobular septal thickening in the lung bases. Small left pleural effusion. Scattered areas of bandlike scarring in both lung bases. Upper Abdomen: No acute abnormality. Musculoskeletal: No chest wall abnormality. No acute osseous findings. IMPRESSION: 1. Dense, masslike consolidation of  the dependent left lower lobe measuring up to 6.1 x 3.9 cm with internal air bronchograms. This may reflect infection or aspiration but is generally somewhat worrisome for malignancy. Recommend short interval follow-up to ensure resolution. 2. Mild interlobular septal thickening in the lung bases, consistent with mild pulmonary edema. 3. Small left pleural effusion. 4. Prominent mediastinal lymph nodes, likely reactive. Attention on follow-up. 5. Coronary artery disease. Aortic Atherosclerosis (ICD10-I70.0). Electronically Signed   By: Fredricka Jenny M.D.   On: 12/31/2023 11:57    Cardiac Studies   Echo done 12/12/23 EF 50-55% valves ok  Cath 12/28/23     Extremely complex situation in this patient with multiple comorbidities including prior stroke with right-sided hemiparesis, diabetes.  Her shortness of breath is new since her recent hospitalization when she had non-STEMI in the setting of urosepsis due to staghorn calculus that requires definitive surgery at some point by urology, pending cardiac risk stratification and optimization.  Echocardiogram showed inferior akinesis but preserved LVEF around 55%.  Stress test showed no infarct, but anterior ischemia with stress EF 44%.  Coronary angiogram today shows 3 distinct occlusions of mid LAD, proximal OM 2, mid RCA with faint collateralization. In addition, she has at least isorhythmic A-V dissociation, if not intermittent complete AV block on EKG.  I suspect this is also ischemic in etiology.  She is hemodynamically stable at this time.   Ideally, her coronary anatomy is suitable for surgical revascularization.  However, multiple comorbidities could limit surgical candidacy.  I would like to discuss her case in multidisciplinary heart team meeting for further decision-making.   I will obtain a twelve-lead EKG.  She had similar appearance on EKG last week in the office as well.  She is hemodynamically stable from her A-V dissociation.  I will consider  discharging home on a monitor, versus inpatient EP consult, after obtaining the EKG.   Manish Corliss Dies, MD  Assessment   Principal Problem:   Unstable angina (HCC) Active Problems:   Type 2 diabetes mellitus with diabetic neuropathy,  with long-term current use of insulin  (HCC)   COPD (chronic obstructive pulmonary disease) (HCC)   Hemiparesis affecting right side as late effect of cerebrovascular accident (CVA) (HCC)   Dysphagia, post-stroke   Chronic kidney disease (CKD), stage III (moderate) (HCC)   Hyperlipidemia associated with type 2 diabetes mellitus (HCC)   Hypertension associated with diabetes (HCC)   Major depression in remission (HCC)   Heart block AV complete (HCC)   Coronary artery calcification   Sick sinus syndrome (HCC)   Plan   Severe 3 vessel CAD. EF mildly depressed No good percutaneous options Dr Deloise Ferries indicated he saw Friday but PA note still incomplete. Given prior stroke, renal calculi with infection COPD poorly controlled DM suspect she will not be a candiate for CABG at this time. See below as well regarding waiting on bronchoscopy to assess LLL mass  Renal:  non obstructing left renal pelvis stone needs antibiotics no hydronephrosis  Cr improved 1.93  Pulmonary:  CT with dense masslike consolidation in LLL with air bronchograms likely pneumonia w/u and antibiotics per primary service Dr Villa Greaser has seen and will before bronchoscopy with intubation and general anesthesia tomorrow. Ok to proceed from cardiac perspective as there is no way to make a decision on CABG/Intervention unless we know what's going on in lung   Janelle Mediate MD [facility] 01/02/2024, 10:37 AM

## 2024-01-02 NOTE — Evaluation (Signed)
 Clinical/Bedside Swallow Evaluation Patient Details  Name: Kelly Gibson MRN: [id number] Date of Birth: 09-29-1960  Today's Date: 01/02/2024 Time: SLP Start Time (ACUTE ONLY): 1218 SLP Stop Time (ACUTE ONLY): 1230 SLP Time Calculation (min) (ACUTE ONLY): 12 min  Past Medical History:  Past Medical History:  Diagnosis Date   Bradycardia    SR/SB with up to 3.5 second pause (asymptomatic) on ~ 10/2017 event monitor; saw EP Dr. Nunzio Belch: avoid AV nodal blocking agents   Chronic kidney disease    COPD (chronic obstructive pulmonary disease) (HCC)    Depression    Depression with anxiety 08/29/2021   Diabetes mellitus    Dysarthria, post-stroke    Dysrhythmia    Ganglion cyst 03/09/2012   History of kidney stones    Hyperlipidemia    Hypertension    Hypokalemia    Myocardial infarction (HCC)    NSTEMI   Obesity, unspecified 12/17/2011   Pneumonia    Pneumonia of left lower lobe due to infectious organism 12/12/2023   PONV (postoperative nausea and vomiting)    Skin lesion of face 11/17/2022   Stroke [facility])    2019   right side hemiparesis   Past Surgical History:  Past Surgical History:  Procedure Laterality Date   CESAREAN SECTION     x 3   LEFT HEART CATH AND CORONARY ANGIOGRAPHY N/A 12/28/2023   Procedure: LEFT HEART CATH AND CORONARY ANGIOGRAPHY;  Surgeon: Cody Das, MD;  Location: [facility] INVASIVE CV LAB;  Service: Cardiovascular;  Laterality: N/A;   ORIF WRIST FRACTURE Right 05/04/2018   Procedure: OPEN REDUCTION INTERNAL FIXATION (ORIF) RIGHT  WRIST FRACTURE;  Surgeon: Micheline Ahr, MD;  Location: [facility] OR;  Service: Orthopedics;  Laterality: Right;   TUMOR REMOVAL     left shoulder   HPI:  63 yo female with recent admission for NSTEMI, E. coli bacteremia and intermittent heart block, mild to have an obstructing kidney stone.  Troponin is markedly elevated into the 15,000's.  She underwent outpatient nuclear stress testing for cardiac risk assessment and had  significant reversible anterior ischemia.  She had outpatient cardiac catheterization but no intervention although bypass grafting is considered.  She now presents back 2 days later with chest pain, nausea and vomiting. Persistent left lower lobe infiltrate first noted May 2025 during hospitalization in [place]  for E. coli bacteremia.  Of note, chest x-ray from October 2024 shows left lower lobe alveolar process. She was a 1 pack/day smoker until she quit in 2017, about 20 pack years .  Concern is for malignancy per MD as well as recurrent aspiration.  She has been on regular diet even though she was advised on a dysphagia diet after her stroke and she clearly provides a history of excessive aspiration and choking on her food. Patient seen by SLP 12/28/2023 with recommendations for a regular diet. Discharged from CIR in 2019 on a regular diet, thin liquid.    Assessment / Plan / Recommendation  Clinical Impression  Discussed option for FEES today to objectively evaluate swallowing given that MBS can not be complete this day however patient politely declined stating she would rather wait for MBS. Briefly observed patient with current diet, regular solids, thin liquids, to evaluate for safety until MBS can be completed. Patient again with a functional oropharyngeal swallow with timely and full oral clearance and no overt s/s of aspiration across consistencies. Will plan for MBS next date after bronchoscopy as scheduling allows. SLP Visit Diagnosis: Dysphagia, unspecified (R13.10)  Diet Recommendation Regular;Thin liquid    Liquid Administration via: Cup;Straw Medication Administration: Whole meds with liquid Supervision: Patient able to self feed Compensations: Minimize environmental distractions;Slow rate;Small sips/bites Postural Changes: Seated upright at 90 degrees    Other  Recommendations Oral Care Recommendations: Oral care BID       Swallow Study   General HPI: 63 yo female with  recent admission for NSTEMI, E. coli bacteremia and intermittent heart block, mild to have an obstructing kidney stone.  Troponin is markedly elevated into the 15,000's.  She underwent outpatient nuclear stress testing for cardiac risk assessment and had significant reversible anterior ischemia.  She had outpatient cardiac catheterization but no intervention although bypass grafting is considered.  She now presents back 2 days later with chest pain, nausea and vomiting. Persistent left lower lobe infiltrate first noted May 2025 during hospitalization in [place]  for E. coli bacteremia.  Of note, chest x-ray from October 2024 shows left lower lobe alveolar process. She was a 1 pack/day smoker until she quit in 2017, about 20 pack years .  Concern is for malignancy per MD as well as recurrent aspiration.  She has been on regular diet even though she was advised on a dysphagia diet after her stroke and she clearly provides a history of excessive aspiration and choking on her food. Patient seen by SLP 12/28/2023 with recommendations for a regular diet. Discharged from CIR in 2019 on a regular diet, thin liquid. Type of Study: Bedside Swallow Evaluation Previous Swallow Assessment: 12/28/23-normal swallow Diet Prior to this Study: Regular;Thin liquids (Level 0) Temperature Spikes Noted: No Respiratory Status: Nasal cannula History of Recent Intubation: No Behavior/Cognition: Alert;Cooperative Oral Cavity Assessment: Within Functional Limits Oral Care Completed by SLP: No Oral Cavity - Dentition: Adequate natural dentition Vision: Functional for self-feeding Self-Feeding Abilities: Able to feed self Patient Positioning: Upright in chair Baseline Vocal Quality: Hoarse Volitional Cough: Strong Volitional Swallow: Able to elicit    Oral/Motor/Sensory Function Overall Oral Motor/Sensory Function: Mild impairment Facial Symmetry: Abnormal symmetry left   Ice Chips Ice chips: Not tested   Thin Liquid Thin  Liquid: Within functional limits Presentation: Straw;Self Fed    Nectar Thick Nectar Thick Liquid: Not tested   Honey Thick Honey Thick Liquid: Not tested   Puree Puree: Not tested   Solid     Solid: Within functional limits Presentation: Self Fed     [employer] MA, [name]  Ayoub Arey [name] 01/02/2024,12:52 PM

## 2024-01-02 NOTE — Progress Notes (Signed)
 Progress Note   Patient: Kelly Gibson WUJ:[id number] DOB: May 17, 1961 DOA: 12/30/2023     0 DOS: the patient was seen and examined on 01/02/2024   Brief hospital course: Kelly Gibson is a 63 y.o. female with medical history significant of hypertension, hyperlipidemia, diabetes, CVA with residual hemiparesis and dysphagia, AV block, CKD 3, CAD, depression, COPD presenting with chest pain.   Patient underwent recent cardiac cath 12/28/23 for further workup of issues with shortness of breath and admission where she was found to have significant elevated troponin.  At that time she was experiencing sepsis and catheterization was not pursued but she did receive aspirin , Plavix , heparin .  There was concern there for intermittent complete heart block and as she recovered she requested transfer to [facility].  Course they are also complicated by a staghorn calculus with left nephrostomy tube having been placed.   Ultimately cardiac cath was deferred and patient underwent Myoview  on 5/22 showing high risk study and catheterization was performed 2 days ago.  Found to have three-vessel disease with 3 chronic total occlusions with collaterals.  Concerned that her AV block may be due to ischemia.  She was evaluated for possible pacemaker placement however she was noted to have wandering atrial pacemaker with good junctional rhythm and was asymptomatic so there was not felt to be currently an indication for pacemaker placement per EP.   Presenting today with recurrent/increased intermittent chest pain since her catheterization.  Has reported some nausea and vomiting as well.  Assessment and Plan: CAD Unstable angina versus NSTEMI Patient presented with increased intermittent chest pain with associated nausea and vomiting since catheterization 12/28/23. That time showed complex anatomy with three-vessel chronic total occlusion with some collateral blood flow. Troponin elevation currently 132 is lot less than  recent past. Cardiology and CT surgery on board further management. Monitor on progressive unit for now considering concern for ACS and at risk for high degree AV dissociation. Continue home aspirin  and rosuvastatin    Left lower lobe density Pneumonia vs malignancy Persistent left lower lobe masslike consolidation on chest x-ray. CT chest with out contrast showed mass like density 6.1 x 3.9 cm ?infiltrate vs malignancy. Started Rocephin + azithro. Pulmonology advised bronchoscopy evaluation which is scheduled for tomorrow. SLP evaluation advised MBS study, scheduled for tomorrow after bronch.   AKI on CKD 3 Creatinine improving. Renal sono ruled out obstruction, has left renal pelvis non obstructing calculus. Continue to monitor daily renal function. Avoid nephrotoxic drugs.  Chronic diastolic CHF Volume overload Last LVEF 50-55%, G1 DD. Currently only prescribed Lasix  as needed but was told by cardiology recently to hold off on Lasix  per patient. Concerned that she may have some mild exacerbation of chronic diastolic CHF. got IV Lasix  which she tolerated well. Hold diuresis for now due to kidney dysfunction.   History of staghorn calculus Renal uls showed - Left renal pelvis non obstructing stone, no hydro, Urinalysis unremarkable. Blood cultures negative. Outpatient urology follow up.   AV block Outpatient seen by cardiac electrophysiology who felt that she was likely having some degree of wandering atrial pacemaker. No acute need for pacemaker was noted.  Supportive care. Continue to hold any nodal blocking agents   Hypertension Continue home hydralazine    Diabetes 10-15 units twice daily long-acting at home with SSI 15 unit long-acting twice daily Accuchecks, sliding scale insulin  per floor protocol.   History of CVA History of dysphagia post CVA and also has residual right hemiparesis. Continue ASA, rosuvastatin    Depression  Continue home Celexa    COPD Continue  home Breztri  and albuterol .  Obesity Class I: BMI 33.39 Diet, exercise and weight reduction advised.      Out of bed to chair. Incentive spirometry. Nursing supportive care. Fall, aspiration precautions. Diet:  Diet Orders (From admission, onward)     Start     Ordered   12/30/23 1454  Diet heart healthy/carb modified Room service appropriate? Yes; Fluid consistency: Thin  Diet effective now       Question Answer Comment  Diet-HS Snack? Nothing   Room service appropriate? Yes   Fluid consistency: Thin      12/30/23 1453           DVT prophylaxis: heparin  injection 5,000 Units Start: 12/30/23 2200  Level of care: Progressive   Code Status: Full Code  Subjective: Patient is seen and examined today morning. She is sitting gin chair. She denies chest pain. Eating fair. No family at bedside.  Physical Exam: Vitals:   01/02/24 0400 01/02/24 0600 01/02/24 0723 01/02/24 1124  BP: (!) 129/53  (!) 137/56 (!) 106/56  Pulse: (!) 51  66 (!) 55  Resp: 20  18 20   Temp:   99.1 F (37.3 C) 98.3 F (36.8 C)  TempSrc: Oral  Oral Oral  SpO2: 97%  96% 95%  Weight:  (P) 85.5 kg    Height:        General - Middle aged [place] American female, no apparent distress HEENT - PERRLA, EOMI, atraumatic head, non tender sinuses. Lung - Clear, basal rales, rhonchi, no wheezes. Heart - S1, S2 heard, no murmurs, rubs, trace pedal edema. Abdomen - Soft, non tender, bowel sounds 3 Neuro - Alert, awake and oriented x 3, right sided weakness. Skin - Warm and dry.  Data Reviewed:      Latest Ref Rng & Units 01/02/2024    2:12 AM 12/31/2023    2:31 AM 12/30/2023   10:36 AM  CBC  WBC 4.0 - 10.5 K/uL 6.5  5.6  6.8   Hemoglobin 12.0 - 15.0 g/dL 81.1  91.4  78.2   Hematocrit 36.0 - 46.0 % 35.6  36.8  39.1   Platelets 150 - 400 K/uL 293  316  315       Latest Ref Rng & Units 01/02/2024    2:12 AM 12/31/2023    2:31 AM 12/30/2023   10:36 AM  BMP  Glucose 70 - 99 mg/dL 956  213  086   BUN 8 - 23  mg/dL 21  18  21    Creatinine 0.44 - 1.00 mg/dL 5.78  4.69  6.29   Sodium 135 - 145 mmol/L 137  139  140   Potassium 3.5 - 5.1 mmol/L 3.8  3.4  4.3   Chloride 98 - 111 mmol/L 104  103  107   CO2 22 - 32 mmol/L 24  23  21    Calcium  8.9 - 10.3 mg/dL 8.7  8.9  9.5    US  RENAL Result Date: 12/31/2023 CLINICAL DATA:  LEFT renal calculus EXAM: RENAL / URINARY TRACT ULTRASOUND COMPLETE COMPARISON:  Renal stone protocol CT 12/12/2023 FINDINGS: Right Kidney: Renal measurements: 6.8 x 3.6 x 3.1 cm = volume: 36 mL. Echogenicity within normal limits. No mass or hydronephrosis visualized. Left Kidney: Renal measurements: 9.9 x 6.1 x 3.8 cm = volume: 119 mL. Echogenicity within normal limits. No hydronephrosis. Nonobstructing calculi again seen within the renal pelvis measuring 11 mm and within the lower pole calyx measuring 9  mm. Bladder: Appears normal for degree of bladder distention. Other: None. IMPRESSION: 1. No hydronephrosis. 2. Nonobstructing calculi seen within the LEFT renal pelvis and lower pole calyx. Electronically Signed   By: Elester Grim M.D.   On: 12/31/2023 16:31    Family Communication: Discussed with patient, husband. They understand and agree. All questions answered.  Disposition: Status is: Observation The patient will require care spanning > 2 midnights and should be moved to inpatient because: bronchoscopy, swallow evaluation, AKI  Planned Discharge Destination: Home with Home Health     Time spent: 39 minutes  Author: Aisha Hove, MD 01/02/2024 1:14 PM Secure chat 7am to 7pm For on call review [email or web address].

## 2024-01-02 NOTE — Progress Notes (Signed)
 Mobility Specialist Progress Note;   01/02/24 0951  Mobility  Activity Transferred from bed to chair  Level of Assistance +2 (takes two people)  Assistive Device Stedy  Activity Response Tolerated well  Mobility Referral Yes  Mobility visit 1 Mobility  Mobility Specialist Start Time (ACUTE ONLY) 0957  Mobility Specialist Stop Time (ACUTE ONLY) 1008  Mobility Specialist Time Calculation (min) (ACUTE ONLY) 11 min   NT requesting assistance transferring pt from bed to chair, pt agreeable to mobility. Required MinA +2 for bed mobility and to stand from EoB. Safely transferred pt from bed to chair via Stedy. R sided weakness displayed requiring extra assistance. VSS on 2LO2. Pt left in chair with all needs met. NT present.   Janit Meline Mobility Specialist Please contact via SecureChat or [employer] [phone]

## 2024-01-02 NOTE — Progress Notes (Signed)
 Plan for bronchoscopy 6/9 pending endo availability Npo after MN Cleared by cardiology with due risk Discussed risks & benefits with patient  Jennifer Moellers. Villa Greaser MD

## 2024-01-03 ENCOUNTER — Observation Stay (HOSPITAL_COMMUNITY)

## 2024-01-03 ENCOUNTER — Inpatient Hospital Stay (HOSPITAL_COMMUNITY)

## 2024-01-03 ENCOUNTER — Inpatient Hospital Stay (HOSPITAL_COMMUNITY): Admitting: Anesthesiology

## 2024-01-03 ENCOUNTER — Encounter (HOSPITAL_COMMUNITY): Payer: Self-pay | Admitting: Internal Medicine

## 2024-01-03 ENCOUNTER — Encounter (HOSPITAL_COMMUNITY)
Admission: EM | Discharge status: Home / Self Care | Payer: Self-pay | Source: Home / Self Care | Attending: Internal Medicine

## 2024-01-03 DIAGNOSIS — R918 Other nonspecific abnormal finding of lung field: Secondary | ICD-10-CM

## 2024-01-03 DIAGNOSIS — D631 Anemia in chronic kidney disease: Secondary | ICD-10-CM | POA: Diagnosis present

## 2024-01-03 DIAGNOSIS — I10 Essential (primary) hypertension: Secondary | ICD-10-CM | POA: Diagnosis not present

## 2024-01-03 DIAGNOSIS — N179 Acute kidney failure, unspecified: Secondary | ICD-10-CM | POA: Diagnosis present

## 2024-01-03 DIAGNOSIS — I5032 Chronic diastolic (congestive) heart failure: Secondary | ICD-10-CM | POA: Diagnosis present

## 2024-01-03 DIAGNOSIS — E1169 Type 2 diabetes mellitus with other specified complication: Secondary | ICD-10-CM | POA: Diagnosis present

## 2024-01-03 DIAGNOSIS — I200 Unstable angina: Secondary | ICD-10-CM | POA: Diagnosis present

## 2024-01-03 DIAGNOSIS — E1159 Type 2 diabetes mellitus with other circulatory complications: Secondary | ICD-10-CM | POA: Diagnosis not present

## 2024-01-03 DIAGNOSIS — Z48813 Encounter for surgical aftercare following surgery on the respiratory system: Secondary | ICD-10-CM | POA: Diagnosis not present

## 2024-01-03 DIAGNOSIS — I222 Subsequent non-ST elevation (NSTEMI) myocardial infarction: Secondary | ICD-10-CM | POA: Diagnosis present

## 2024-01-03 DIAGNOSIS — Z794 Long term (current) use of insulin: Secondary | ICD-10-CM | POA: Diagnosis not present

## 2024-01-03 DIAGNOSIS — R0989 Other specified symptoms and signs involving the circulatory and respiratory systems: Secondary | ICD-10-CM | POA: Diagnosis not present

## 2024-01-03 DIAGNOSIS — I2510 Atherosclerotic heart disease of native coronary artery without angina pectoris: Secondary | ICD-10-CM | POA: Diagnosis not present

## 2024-01-03 DIAGNOSIS — F325 Major depressive disorder, single episode, in full remission: Secondary | ICD-10-CM | POA: Diagnosis present

## 2024-01-03 DIAGNOSIS — E1165 Type 2 diabetes mellitus with hyperglycemia: Secondary | ICD-10-CM | POA: Diagnosis present

## 2024-01-03 DIAGNOSIS — J449 Chronic obstructive pulmonary disease, unspecified: Secondary | ICD-10-CM | POA: Diagnosis not present

## 2024-01-03 DIAGNOSIS — E66811 Obesity, class 1: Secondary | ICD-10-CM | POA: Diagnosis present

## 2024-01-03 DIAGNOSIS — I219 Acute myocardial infarction, unspecified: Secondary | ICD-10-CM | POA: Diagnosis present

## 2024-01-03 DIAGNOSIS — I442 Atrioventricular block, complete: Secondary | ICD-10-CM | POA: Diagnosis present

## 2024-01-03 DIAGNOSIS — I495 Sick sinus syndrome: Secondary | ICD-10-CM | POA: Diagnosis not present

## 2024-01-03 DIAGNOSIS — C3432 Malignant neoplasm of lower lobe, left bronchus or lung: Secondary | ICD-10-CM

## 2024-01-03 DIAGNOSIS — R590 Localized enlarged lymph nodes: Secondary | ICD-10-CM | POA: Diagnosis not present

## 2024-01-03 DIAGNOSIS — Z7984 Long term (current) use of oral hypoglycemic drugs: Secondary | ICD-10-CM | POA: Diagnosis not present

## 2024-01-03 DIAGNOSIS — E1140 Type 2 diabetes mellitus with diabetic neuropathy, unspecified: Secondary | ICD-10-CM | POA: Diagnosis not present

## 2024-01-03 DIAGNOSIS — I69351 Hemiplegia and hemiparesis following cerebral infarction affecting right dominant side: Secondary | ICD-10-CM | POA: Diagnosis not present

## 2024-01-03 DIAGNOSIS — I152 Hypertension secondary to endocrine disorders: Secondary | ICD-10-CM | POA: Diagnosis not present

## 2024-01-03 DIAGNOSIS — I4891 Unspecified atrial fibrillation: Secondary | ICD-10-CM | POA: Diagnosis not present

## 2024-01-03 DIAGNOSIS — N183 Chronic kidney disease, stage 3 unspecified: Secondary | ICD-10-CM | POA: Diagnosis present

## 2024-01-03 DIAGNOSIS — I5033 Acute on chronic diastolic (congestive) heart failure: Secondary | ICD-10-CM | POA: Diagnosis not present

## 2024-01-03 DIAGNOSIS — E785 Hyperlipidemia, unspecified: Secondary | ICD-10-CM | POA: Diagnosis not present

## 2024-01-03 DIAGNOSIS — I69391 Dysphagia following cerebral infarction: Secondary | ICD-10-CM | POA: Diagnosis not present

## 2024-01-03 DIAGNOSIS — E1122 Type 2 diabetes mellitus with diabetic chronic kidney disease: Secondary | ICD-10-CM | POA: Diagnosis present

## 2024-01-03 DIAGNOSIS — I25110 Atherosclerotic heart disease of native coronary artery with unstable angina pectoris: Secondary | ICD-10-CM | POA: Diagnosis present

## 2024-01-03 DIAGNOSIS — N1832 Chronic kidney disease, stage 3b: Secondary | ICD-10-CM | POA: Diagnosis not present

## 2024-01-03 DIAGNOSIS — C3490 Malignant neoplasm of unspecified part of unspecified bronchus or lung: Secondary | ICD-10-CM

## 2024-01-03 HISTORY — PX: VIDEO BRONCHOSCOPY WITH ENDOBRONCHIAL ULTRASOUND: SHX6177

## 2024-01-03 HISTORY — PX: VIDEO BRONCHOSCOPY WITH ENDOBRONCHIAL NAVIGATION: SHX6175

## 2024-01-03 LAB — BASIC METABOLIC PANEL WITH GFR
Anion gap: 10 (ref 5–15)
BUN: 19 mg/dL (ref 8–23)
CO2: 25 mmol/L (ref 22–32)
Calcium: 9.1 mg/dL (ref 8.9–10.3)
Chloride: 103 mmol/L (ref 98–111)
Creatinine, Ser: 1.63 mg/dL — ABNORMAL HIGH (ref 0.44–1.00)
GFR, Estimated: 36 mL/min — ABNORMAL LOW (ref >60)
Glucose, Bld: 132 mg/dL — ABNORMAL HIGH (ref 70–99)
Potassium: 3.9 mmol/L (ref 3.5–5.1)
Sodium: 138 mmol/L (ref 135–145)

## 2024-01-03 LAB — CBC
HCT: 35.9 % — ABNORMAL LOW (ref 36.0–46.0)
Hemoglobin: 11 g/dL — ABNORMAL LOW (ref 12.0–15.0)
MCH: 26.8 pg (ref 26.0–34.0)
MCHC: 30.6 g/dL (ref 30.0–36.0)
MCV: 87.6 fL (ref 80.0–100.0)
Platelets: 286 10*3/uL (ref 150–400)
RBC: 4.1 MIL/uL (ref 3.87–5.11)
RDW: 15.4 % (ref 11.5–15.5)
WBC: 6.7 10*3/uL (ref 4.0–10.5)
nRBC: 0 % (ref 0.0–0.2)

## 2024-01-03 LAB — GLUCOSE, CAPILLARY
Glucose-Capillary: 156 mg/dL — ABNORMAL HIGH (ref 70–99)
Glucose-Capillary: 116 mg/dL — ABNORMAL HIGH (ref 70–99)
Glucose-Capillary: 101 mg/dL — ABNORMAL HIGH (ref 70–99)
Glucose-Capillary: 120 mg/dL — ABNORMAL HIGH (ref 70–99)
Glucose-Capillary: 146 mg/dL — ABNORMAL HIGH (ref 70–99)

## 2024-01-03 SURGERY — BRONCHOSCOPY, WITH EBUS
Anesthesia: General | Laterality: Bilateral

## 2024-01-03 MED ORDER — EPHEDRINE SULFATE-NACL 50-0.9 MG/10ML-% IV SOSY
PREFILLED_SYRINGE | INTRAVENOUS | Status: DC | PRN
  Administered 2024-01-03: 5 mg via INTRAVENOUS

## 2024-01-03 MED ORDER — LACTATED RINGERS IV SOLN
INTRAVENOUS | Status: AC | PRN
  Administered 2024-01-03: 1000 mL via INTRAVENOUS

## 2024-01-03 MED ORDER — PROPOFOL 10 MG/ML IV BOLUS
INTRAVENOUS | Status: DC | PRN
  Administered 2024-01-03: 140 mg via INTRAVENOUS

## 2024-01-03 MED ORDER — SUGAMMADEX SODIUM 200 MG/2ML IV SOLN
INTRAVENOUS | Status: DC | PRN
  Administered 2024-01-03: 180 mg via INTRAVENOUS

## 2024-01-03 MED ORDER — FENTANYL CITRATE (PF) 250 MCG/5ML IJ SOLN
INTRAMUSCULAR | Status: DC | PRN
  Administered 2024-01-03: 100 ug via INTRAVENOUS

## 2024-01-03 MED ORDER — ONDANSETRON HCL 4 MG/2ML IJ SOLN
INTRAMUSCULAR | Status: DC | PRN
  Administered 2024-01-03: 4 mg via INTRAVENOUS

## 2024-01-03 MED ORDER — PHENYLEPHRINE HCL-NACL 20-0.9 MG/250ML-% IV SOLN
INTRAVENOUS | Status: DC | PRN
  Administered 2024-01-03: 40 ug/min via INTRAVENOUS

## 2024-01-03 MED ORDER — PROPOFOL 500 MG/50ML IV EMUL
INTRAVENOUS | Status: DC | PRN
  Administered 2024-01-03: 110 ug/kg/min via INTRAVENOUS

## 2024-01-03 MED ORDER — GLYCOPYRROLATE PF 0.2 MG/ML IJ SOSY
PREFILLED_SYRINGE | INTRAMUSCULAR | Status: DC | PRN
Start: 2024-01-03 — End: 2024-01-03
  Administered 2024-01-03: .2 mg via INTRAVENOUS

## 2024-01-03 MED ORDER — ROCURONIUM BROMIDE 10 MG/ML (PF) SYRINGE
PREFILLED_SYRINGE | INTRAVENOUS | Status: DC | PRN
  Administered 2024-01-03: 70 mg via INTRAVENOUS

## 2024-01-03 MED ORDER — LIDOCAINE 2% (20 MG/ML) 5 ML SYRINGE
INTRAMUSCULAR | Status: DC | PRN
  Administered 2024-01-03: 20 mg via INTRAVENOUS

## 2024-01-03 NOTE — Progress Notes (Signed)
 Mobility Specialist Progress Note;   01/03/24 0958  Mobility  Activity Transferred from bed to chair  Level of Assistance Contact guard assist, steadying assist  Assistive Device Stedy  Activity Response Tolerated well  Mobility Referral Yes  Mobility visit 1 Mobility  Mobility Specialist Start Time (ACUTE ONLY) [id number]  Mobility Specialist Stop Time (ACUTE ONLY) 1008  Mobility Specialist Time Calculation (min) (ACUTE ONLY) 10 min   Pt agreeable to mobility. Required MinG for bed mobility and to stand from EoB via stedy. Transferred pt from bed to chair w/o fault. VSS on 2LO2. Pt left in chair with all needs met, call bell in reach. Husband in room.   Kelly Gibson Mobility Specialist Please contact via SecureChat or [employer] [phone]

## 2024-01-03 NOTE — Anesthesia Postprocedure Evaluation (Signed)
 Anesthesia Post Note  Patient: Kelly Gibson  Procedure(s) Performed: BRONCHOSCOPY, WITH EBUS (Bilateral) VIDEO BRONCHOSCOPY WITH ENDOBRONCHIAL NAVIGATION     Patient location during evaluation: PACU Anesthesia Type: General Level of consciousness: awake and alert and oriented Pain management: pain level controlled Vital Signs Assessment: post-procedure vital signs reviewed and stable Respiratory status: spontaneous breathing, nonlabored ventilation and respiratory function stable Cardiovascular status: blood pressure returned to baseline and stable Postop Assessment: no apparent nausea or vomiting Anesthetic complications: no   No notable events documented.  Last Vitals:  Vitals:   01/03/24 1542 01/03/24 1548  BP: 111/68 120/62  Pulse: 69 67  Resp: 19 20  Temp:    SpO2: 97% 93%    Last Pain:  Vitals:   01/03/24 1548  TempSrc:   PainSc: 0-No pain                 [name] A.

## 2024-01-03 NOTE — Interval H&P Note (Signed)
 History and Physical Interval Note:  01/03/2024 12:13 PM  Kelly Gibson  has presented today for surgery, with the diagnosis of lung mass.  The various methods of treatment have been discussed with the patient and family. After consideration of risks, benefits and other options for treatment, the patient has consented to  Procedure(s): BRONCHOSCOPY, WITH EBUS (Bilateral) VIDEO BRONCHOSCOPY WITH ENDOBRONCHIAL NAVIGATION as a surgical intervention.  The patient's history has been reviewed, patient examined, no change in status, stable for surgery.  I have reviewed the patient's chart and labs.  Questions were answered to the patient's satisfaction.     Denson Flake

## 2024-01-03 NOTE — Progress Notes (Signed)
 Progress Note   Patient: Kelly Gibson [id number]:[id number] DOB: 1961/01/19 DOA: 12/30/2023     0 DOS: the patient was seen and examined on 01/03/2024   Brief hospital course: Kelly Gibson is a 63 y.o. female with medical history significant of hypertension, hyperlipidemia, diabetes, CVA with residual hemiparesis and dysphagia, AV block, CKD 3, CAD, depression, COPD presenting with chest pain.   Patient underwent recent cardiac cath 12/28/23 for further workup of issues with shortness of breath and admission where she was found to have significant elevated troponin.  At that time she was experiencing sepsis and catheterization was not pursued but she did receive aspirin , Plavix , heparin .  There was concern there for intermittent complete heart block and as she recovered she requested transfer to [facility].  Course they are also complicated by a staghorn calculus with left nephrostomy tube having been placed.   Ultimately cardiac cath was deferred and patient underwent Myoview  on 5/22 showing high risk study and catheterization was performed 2 days ago.  Found to have three-vessel disease with 3 chronic total occlusions with collaterals.  Concerned that her AV block may be due to ischemia.  She was evaluated for possible pacemaker placement however she was noted to have wandering atrial pacemaker with good junctional rhythm and was asymptomatic so there was not felt to be currently an indication for pacemaker placement per EP.   Presented with recurrent/increased intermittent chest pain since her catheterization admitted to [facility] service for further management. She is found to have LLL dense mass like lesion, pulmonology planned bronchoscopy. Cardiology and CVTS on board following her.  Assessment and Plan: CAD Unstable angina versus NSTEMI Patient presented with increased intermittent chest pain with associated nausea and vomiting since catheterization 12/28/23. That time showed complex anatomy with  three-vessel chronic total occlusion with some collateral blood flow. Troponin elevation less than prior admission. Cardiology and CT surgery on board further management. Continue home aspirin  and rosuvastatin    Left lower lobe density Pneumonia vs malignancy Persistent left lower lobe masslike consolidation on chest x-ray. CT chest with out contrast showed mass like density 6.1 x 3.9 cm ?infiltrate vs malignancy. Continue Rocephin + azithro. Pulmonology plan to do bronchoscopy evaluation today. SLP scheduled MBS study for tomorrow.   AKI on CKD 3 Creatinine improving. Renal sono ruled out obstruction, has left renal pelvis non obstructing calculus. Continue to monitor daily renal function. Avoid nephrotoxic drugs.  Chronic diastolic CHF Volume overload Last LVEF 50-55%, G1 DD. Currently only prescribed Lasix  as needed but was told by cardiology recently to hold off on Lasix  per patient. Concerned that she may have some mild exacerbation of chronic diastolic CHF. got IV Lasix  which she tolerated well. Hold diuresis for now due to kidney dysfunction.   History of staghorn calculus Renal uls showed - Left renal pelvis non obstructing stone, no hydro, Urinalysis unremarkable. Blood cultures negative. This can be followed outpatient once she has cardiac plan is in place.   AV block Outpatient seen by cardiac electrophysiology who felt that she was likely having some degree of wandering atrial pacemaker. No acute need for pacemaker was noted.  Supportive care. Continue to hold any nodal blocking agents   Hypertension Continue home hydralazine    Type 2 Diabetes mellitus 5 units semglee  twice daily. Accuchecks, sliding scale insulin  per floor protocol.   History of CVA History of dysphagia post CVA and also has residual right hemiparesis. Continue ASA, rosuvastatin . Swallow study for tomorrow.   Depression Continue home Celexa   COPD Continue home Breztri  and  albuterol .  Obesity Class I: BMI 33.39 Diet, exercise and weight reduction advised.      Out of bed to chair. Incentive spirometry. Nursing supportive care. Fall, aspiration precautions. Diet:  Diet Orders (From admission, onward)     Start     Ordered   01/03/24 0001  Diet NPO time specified  Diet effective midnight        01/02/24 1323           DVT prophylaxis: heparin  injection 5,000 Units Start: 12/30/23 2200  Level of care: Progressive   Code Status: Full Code  Subjective: Patient is seen and examined today morning. She is sitting in chair. Has no chest pain. Husband at bedside.  Physical Exam: Vitals:   01/03/24 0442 01/03/24 0717 01/03/24 0849 01/03/24 1131  BP: 130/62 118/64  (!) 141/67  Pulse: 60 (!) 55  (!) 51  Resp: 19 20 19 20   Temp: 97.8 F (36.6 C) 98.4 F (36.9 C)  98.5 F (36.9 C)  TempSrc: Oral Oral  Oral  SpO2: 98% 96% 97% 97%  Weight: 86.5 kg     Height:        General - Middle aged [place] American female, no apparent distress HEENT - PERRLA, EOMI, atraumatic head, non tender sinuses. Lung - Clear, basal rales, rhonchi, no wheezes. Heart - S1, S2 heard, no murmurs, rubs, trace pedal edema. Abdomen - Soft, non tender, bowel sounds 3 Neuro - Alert, awake and oriented x 3, right sided weakness. Skin - Warm and dry.  Data Reviewed:      Latest Ref Rng & Units 01/03/2024    2:13 AM 01/02/2024    2:12 AM 12/31/2023    2:31 AM  CBC  WBC 4.0 - 10.5 K/uL 6.7  6.5  5.6   Hemoglobin 12.0 - 15.0 g/dL 09.8  11.9  14.7   Hematocrit 36.0 - 46.0 % 35.9  35.6  36.8   Platelets 150 - 400 K/uL 286  293  316       Latest Ref Rng & Units 01/03/2024    2:13 AM 01/02/2024    2:12 AM 12/31/2023    2:31 AM  BMP  Glucose 70 - 99 mg/dL 829  562  130   BUN 8 - 23 mg/dL 19  21  18    Creatinine 0.44 - 1.00 mg/dL 8.65  7.84  6.96   Sodium 135 - 145 mmol/L 138  137  139   Potassium 3.5 - 5.1 mmol/L 3.9  3.8  3.4   Chloride 98 - 111 mmol/L 103  104  103   CO2  22 - 32 mmol/L 25  24  23    Calcium  8.9 - 10.3 mg/dL 9.1  8.7  8.9    No results found.   Family Communication: Discussed with patient, husband. They understand and agree. All questions answered.  Disposition: Status is: inpatient  inpatient because: bronchoscopy, swallow evaluation, cardiac work up.  Planned Discharge Destination: Home with Home Health     Time spent: 40 minutes  Author: Aisha Hove, MD 01/03/2024 12:50 PM Secure chat 7am to 7pm For on call review [email or web address].

## 2024-01-03 NOTE — Anesthesia Procedure Notes (Signed)
 Procedure Name: Intubation Date/Time: 01/03/2024 2:08 PM  Performed by: Claud Crumb, CRNAPre-anesthesia Checklist: Patient identified, Emergency Drugs available, Suction available and Patient being monitored Patient Re-evaluated:Patient Re-evaluated prior to induction Oxygen Delivery Method: Circle System Utilized Preoxygenation: Pre-oxygenation with 100% oxygen Induction Type: IV induction Ventilation: Mask ventilation without difficulty Laryngoscope Size: Mac and 3 Grade View: Grade II Tube type: Oral Tube size: 8.5 mm Number of attempts: 1 Airway Equipment and Method: Stylet and Oral airway Placement Confirmation: ETT inserted through vocal cords under direct vision, positive ETCO2 and breath sounds checked- equal and bilateral Secured at: 21 cm Tube secured with: Tape Dental Injury: Teeth and Oropharynx as per pre-operative assessment

## 2024-01-03 NOTE — Transfer of Care (Signed)
 Immediate Anesthesia Transfer of Care Note  Patient: Kelly Gibson  Procedure(s) Performed: BRONCHOSCOPY, WITH EBUS (Bilateral) VIDEO BRONCHOSCOPY WITH ENDOBRONCHIAL NAVIGATION  Patient Location: PACU and Endoscopy Unit  Anesthesia Type:General  Level of Consciousness: drowsy, patient cooperative, and responds to stimulation  Airway & Oxygen Therapy: Patient Spontanous Breathing and Patient connected to nasal cannula oxygen  Post-op Assessment: Report given to RN and Post -op Vital signs reviewed and stable  Post vital signs: Reviewed and stable  Last Vitals:  Vitals Value Taken Time  BP    Temp    Pulse 76 01/03/24 1525  Resp    SpO2 96 % 01/03/24 1525  Vitals shown include unfiled device data.  Last Pain:  Vitals:   01/03/24 1303  TempSrc:   PainSc: 0-No pain         Complications: No notable events documented.

## 2024-01-03 NOTE — Op Note (Signed)
 Video Bronchoscopy with Endobronchial Ultrasound and Electromagnetic Navigation Procedure Note  Date of Operation: 01/03/2024  Pre-op Diagnosis: Left lower lobe mass, mediastinal adenopathy  Post-op Diagnosis: Same  Surgeon: Racheal Buddle  Assistants: None  Anesthesia: General endotracheal anesthesia  Operation: Flexible video fiberoptic bronchoscopy with endobronchial ultrasound, robotic assisted navigation and biopsies.  Estimated Blood Loss: Minimal  Complications: None apparent  Indications and History: Kelly Gibson is a 63 y.o. female with history of tobacco use, COPD, CVA, diabetes, CKD, CAD.  Found to have a rounded irregular left lower lobe mass on CT scan of the chest, associated mediastinal adenopathy.  Recommendation was made to achieve a tissue diagnosis using endobronchial ultrasound and robotic assisted navigational bronchoscopy. The risks, benefits, complications, treatment options and expected outcomes were discussed with the patient.  The possibilities of pneumothorax, pneumonia, reaction to medication, pulmonary aspiration, perforation of a viscus, bleeding, failure to diagnose a condition and creating a complication requiring transfusion or operation were discussed with the patient who freely signed the consent.    Description of Procedure: The patient was seen in the Preoperative Area, was examined and was deemed appropriate to proceed.  The patient was taken to [facility] Endoscopy room 3, identified as Kelly Gibson and the procedure verified as Flexible Video Fiberoptic Bronchoscopy with robotic assisted navigation and endobronchial ultrasound.  A Time Out was held and the above information confirmed.   Robotic assisted navigation: Prior to the date of the procedure a high-resolution CT scan of the chest was performed. Utilizing ION software program a virtual tracheobronchial tree was generated to allow the creation of distinct navigation pathways to the patient's  parenchymal abnormalities. After being taken to the operating room general anesthesia was initiated and the patient  was orally intubated. The video fiberoptic bronchoscope was introduced via the endotracheal tube and a general inspection was performed which showed generally edematous airways but normal right and left lung anatomy. Aspiration of the bilateral mainstems was completed to remove any remaining secretions. Robotic catheter inserted into patient's endotracheal tube.   Target #1 left lower lobe mass: The distinct navigation pathways prepared prior to this procedure were then utilized to navigate to patient's lesion identified on CT scan. The robotic catheter was secured into place and the vision probe was withdrawn.  Lesion location was approximated using fluoroscopy.  Local registration and targeting was performed using [employer] Cios mobile C-arm three-dimensional imaging. Under fluoroscopic guidance transbronchial needle brushings, transbronchial needle biopsies, and transbronchial forceps biopsies were performed to be sent for cytology and pathology.  Needle-in-lesion was confirmed using Cios mobile C-arm.  A bronchioalveolar lavage was performed in the left lower lobe and sent for microbiology.    Endobronchial ultrasound: The robotic scope was then withdrawn and the endobronchial ultrasound was used to identify and characterize the peritracheal, hilar and bronchial lymph nodes. Inspection showed enlargement at station 4R. Using real-time ultrasound guidance Wang needle biopsies were taken from Station 4R node and were sent for cytology.   At the end of the procedure a general airway inspection was performed and there was no evidence of active bleeding. The bronchoscope was removed.  The patient tolerated the procedure well. There was no significant blood loss and there were no obvious complications. A post-procedural chest x-ray is pending.  Samples Target #1: 1. Transbronchial  needle brushings from left lower lobe mass 2. Transbronchial Wang needle biopsies from left lower lobe mass 3. Transbronchial forceps biopsies from left lower lobe mass 4. Bronchoalveolar lavage from left lower  lobe  EBUS Samples: 1. Wang needle biopsies from 4R node    Racheal Buddle, MD, PhD 01/03/2024, 3:10 PM [facility]

## 2024-01-03 NOTE — Progress Notes (Signed)
 SLP Cancellation Note  Patient Details Name: Kelly Gibson MRN: [id number] DOB: [birth year]   Cancelled treatment:       Reason Eval/Treat Not Completed: Patient at procedure or test/unavailable (Bronchoscopy planned for 1300, same time as MBS scheduled and patient NPO in prep. Will plan to complete MBS next date.)  Delsa Fife MA, [name]  [name] [name] 01/03/2024, 10:10 AM

## 2024-01-03 NOTE — Anesthesia Preprocedure Evaluation (Addendum)
 Anesthesia Evaluation  Patient identified by MRN, date of birth, ID band Patient awake    Reviewed: Allergy & Precautions, NPO status , Patient's Chart, lab work & pertinent test results, reviewed documented beta blocker date and time   History of Anesthesia Complications (+) PONV and history of anesthetic complications  Airway Mallampati: II  TM Distance: >3 FB     Dental no notable dental hx. (+) Teeth Intact, Dental Advisory Given   Pulmonary pneumonia, resolved, COPD,  COPD inhaler, former smoker LLL lung mass Mediastinal lymphadenopathy   breath sounds clear to auscultation + decreased breath sounds      Cardiovascular hypertension, Pt. on medications + angina with exertion + CAD and + Past MI  Normal cardiovascular exam+ dysrhythmias Atrial Fibrillation  Rhythm:Regular Rate:Normal  NSTEMI 12/12/23  Echo 12/12/23  1. Left ventricular ejection fraction, by estimation, is 50 to 55%. The  left ventricle has low normal function. The left ventricle demonstrates  regional wall motion abnormalities (see scoring diagram/findings for  description). There is moderate left  ventricular hypertrophy. Left ventricular diastolic parameters are  consistent with Grade I diastolic dysfunction (impaired relaxation).   2. The aortic valve is calcified. There is moderate calcification of the  aortic valve. Aortic valve regurgitation is not visualized.   3. The inferior vena cava is normal in size with greater than 50%  respiratory variability, suggesting right atrial pressure of 3 mmHg.   Cardiac cath 12/28/23 Coronary angiography 12/28/2023: LM: No significant disease LAD: Moderate sized vessel.          Mid vessel occlusion with moderate calcification, after a large septal branch          Distal vessel filled with collaterals from septal branches Lcx: Moderate sized vessel         Small OM1 with prox 70% stenosis         Medium caliber OM2  with prox occlusion         Distal vessel filled with collaterals from septal branches RCA: Small to medium caliber dominant vessel with prox diffuse 50% disease           Mid vessel occlusion           Distal RCA/RPDA filled with collaterals from septal branches   LVEDP 18 mmHg     Neuro/Psych  PSYCHIATRIC DISORDERS Anxiety Depression    Right hemiparesis, dysphagia CVA, Residual Symptoms    GI/Hepatic negative GI ROS, Neg liver ROS,,,  Endo/Other  diabetes, Well Controlled, Type 2, Insulin  Dependent, Oral Hypoglycemic Agents  Obesity HLD  Renal/GU Renal InsufficiencyRenal disease  negative genitourinary   Musculoskeletal  (+) Arthritis , Osteoarthritis,    Abdominal  (+) + obese  Peds  Hematology  (+) Blood dyscrasia, anemia   Anesthesia Other Findings   Reproductive/Obstetrics                              Anesthesia Physical Anesthesia Plan  ASA: 3  Anesthesia Plan: General   Post-op Pain Management: Minimal or no pain anticipated   Induction: Intravenous  PONV Risk Score and Plan: 4 or greater and Treatment may vary due to age or medical condition, Ondansetron , Propofol  infusion and TIVA  Airway Management Planned: Oral ETT  Additional Equipment: None  Intra-op Plan:   Post-operative Plan: Extubation in OR  Informed Consent: I have reviewed the patients History and Physical, chart, labs and discussed the procedure including the risks, benefits and alternatives  for the proposed anesthesia with the patient or authorized representative who has indicated his/her understanding and acceptance.     Dental advisory given  Plan Discussed with: CRNA and Anesthesiologist  Anesthesia Plan Comments:          Anesthesia Quick Evaluation

## 2024-01-03 NOTE — Progress Notes (Addendum)
 Pt. Reports dyspnea has resolved at this time. Pt. Does remain very groggy. Charge RN made aware of Pt. Condition. Will continue to monitor.

## 2024-01-03 NOTE — Progress Notes (Signed)
 Dr. Butch Cashing made aware, Pt. C/o dyspnea refractory to breathing treatment. Sats are okay, but Pt. Appears very uncomfortable.

## 2024-01-04 ENCOUNTER — Telehealth: Payer: Self-pay | Admitting: Pulmonary Disease

## 2024-01-04 ENCOUNTER — Inpatient Hospital Stay (HOSPITAL_COMMUNITY)

## 2024-01-04 ENCOUNTER — Other Ambulatory Visit (HOSPITAL_COMMUNITY): Payer: Self-pay

## 2024-01-04 DIAGNOSIS — R918 Other nonspecific abnormal finding of lung field: Secondary | ICD-10-CM | POA: Diagnosis not present

## 2024-01-04 DIAGNOSIS — R590 Localized enlarged lymph nodes: Secondary | ICD-10-CM | POA: Diagnosis not present

## 2024-01-04 DIAGNOSIS — I69391 Dysphagia following cerebral infarction: Secondary | ICD-10-CM | POA: Diagnosis not present

## 2024-01-04 DIAGNOSIS — I2510 Atherosclerotic heart disease of native coronary artery without angina pectoris: Secondary | ICD-10-CM | POA: Diagnosis not present

## 2024-01-04 LAB — CBC
HCT: 32.9 % — ABNORMAL LOW (ref 36.0–46.0)
Hemoglobin: 10.1 g/dL — ABNORMAL LOW (ref 12.0–15.0)
MCH: 26.8 pg (ref 26.0–34.0)
MCHC: 30.7 g/dL (ref 30.0–36.0)
MCV: 87.3 fL (ref 80.0–100.0)
Platelets: 290 10*3/uL (ref 150–400)
RBC: 3.77 MIL/uL — ABNORMAL LOW (ref 3.87–5.11)
RDW: 15.5 % (ref 11.5–15.5)
WBC: 5.9 10*3/uL (ref 4.0–10.5)
nRBC: 0 % (ref 0.0–0.2)

## 2024-01-04 LAB — BASIC METABOLIC PANEL WITH GFR
Anion gap: 9 (ref 5–15)
BUN: 19 mg/dL (ref 8–23)
CO2: 26 mmol/L (ref 22–32)
Calcium: 8.9 mg/dL (ref 8.9–10.3)
Chloride: 104 mmol/L (ref 98–111)
Creatinine, Ser: 1.57 mg/dL — ABNORMAL HIGH (ref 0.44–1.00)
GFR, Estimated: 37 mL/min — ABNORMAL LOW (ref >60)
Glucose, Bld: 112 mg/dL — ABNORMAL HIGH (ref 70–99)
Potassium: 4.2 mmol/L (ref 3.5–5.1)
Sodium: 139 mmol/L (ref 135–145)

## 2024-01-04 LAB — GLUCOSE, CAPILLARY
Glucose-Capillary: 109 mg/dL — ABNORMAL HIGH (ref 70–99)
Glucose-Capillary: 173 mg/dL — ABNORMAL HIGH (ref 70–99)
Glucose-Capillary: 166 mg/dL — ABNORMAL HIGH (ref 70–99)

## 2024-01-04 LAB — ACID FAST SMEAR (AFB, MYCOBACTERIA): Acid Fast Smear: NEGATIVE

## 2024-01-04 LAB — CYTOLOGY - NON PAP

## 2024-01-04 MED ORDER — SALINE SPRAY 0.65 % NA SOLN
1.0000 | NASAL | Status: DC | PRN
  Administered 2024-01-04: 1 via NASAL
  Filled 2024-01-04: qty 44

## 2024-01-04 MED ORDER — AMOXICILLIN-POT CLAVULANATE 875-125 MG PO TABS
1.0000 | ORAL_TABLET | Freq: Two times a day (BID) | ORAL | 0 refills | Status: AC
Start: 2024-01-04 — End: ?
  Filled 2024-01-04: qty 7, 4d supply, fill #0

## 2024-01-04 MED ORDER — FUROSEMIDE 40 MG PO TABS
80.0000 mg | ORAL_TABLET | Freq: Once | ORAL | Status: AC
  Administered 2024-01-04: 80 mg via ORAL
  Filled 2024-01-04: qty 2

## 2024-01-04 MED ORDER — SALINE SPRAY 0.65 % NA SOLN: 1.0000 | NASAL | Status: DC | PRN

## 2024-01-04 MED ORDER — ALBUTEROL SULFATE (2.5 MG/3ML) 0.083% IN NEBU: 2.5000 mg | INHALATION_SOLUTION | Freq: Four times a day (QID) | RESPIRATORY_TRACT | 12 refills | Status: AC | PRN

## 2024-01-04 NOTE — Progress Notes (Signed)
 Modified Barium Swallow Study  Patient Details  Name: Kelly Gibson MRN: [id number] Date of Birth: 1960-10-25  Today's Date: 01/04/2024  Modified Barium Swallow completed.  Full report located under Chart Review in the Imaging Section.  History of Present Illness 63 yo female with recent admission for NSTEMI, E. coli bacteremia and intermittent heart block, mild to have an obstructing kidney stone.  Troponin is markedly elevated into the 15,000's.  She underwent outpatient nuclear stress testing for cardiac risk assessment and had significant reversible anterior ischemia.  She had outpatient cardiac catheterization but no intervention although bypass grafting is considered.  She now presents back 2 days later with chest pain, nausea and vomiting. Persistent left lower lobe infiltrate first noted May 2025 during hospitalization in [place]  for E. coli bacteremia.  Of note, chest x-ray from October 2024 shows left lower lobe alveolar process. She was a 1 pack/day smoker until she quit in 2017, about 20 pack years .  Concern is for malignancy per MD as well as recurrent aspiration.  She has been on regular diet even though she was advised on a dysphagia diet after her stroke and she clearly provides a history of excessive aspiration and choking on her food. Patient seen by SLP 12/28/2023 with recommendations for a regular diet. Discharged from CIR in 2019 on a regular diet, thin liquid.   Clinical Impression Patient presents with a mild oral dysphagia characterized by left sided facial assymetry and decreased strength resulting in mildly prolonged mastication of regular texture solids and mildly decreased oral containment of thin liquids, at times with swallow delayed to the pyriform sinuses which is suspected to be baseline from previous CVA. She is able to fully protect her airway however with no penetration or aspiration observed and with full oral and pharyngeal clearance. Patient may remain on a  regular diet, thin liquid. No acute f/u indicated at this time. Factors that may increase risk of adverse event in presence of aspiration Kelly Gibson & Kelly Gibson 2021):    Swallow Evaluation Recommendations Recommendations: PO diet PO Diet Recommendation: Regular;Thin liquids (Level 0) Liquid Administration via: Cup;Straw Medication Administration: Whole meds with liquid Supervision: Patient able to self-feed Swallowing strategies  : Slow rate;Small bites/sips Postural changes: Position pt fully upright for meals Oral care recommendations: Oral care BID (2x/day)     Kelly Chicoine MA, [name]  Kelly Gibson Kelly Gibson 01/04/2024,10:21 AM

## 2024-01-04 NOTE — Progress Notes (Signed)
  Progress Note  Patient Name: MARNA WENIGER Date of Encounter: 01/04/2024 [facility] Cardiologist: Hazle Lites, MD   Interval Summary   63 yo female with recent admission for NSTEMI, E. coli bacteremia and intermittent heart block, mild to have an obstructing kidney stone. Troponin is markedly elevated into the 15,000's. She underwent outpatient nuclear stress testing for cardiac risk assessment and had significant reversible anterior ischemia. She had outpatient cardiac catheterization but no intervention. Now felt to not be a candidate for PCI, and she has declined CABG.   Vital Signs Vitals:   01/03/24 2020 01/03/24 2033 01/03/24 2321 01/04/24 0708  BP: (!) 146/58  (!) 164/67 (!) 160/57  Pulse: (!) 54  (!) 56 (!) 57  Resp: 20  18 20   Temp: 98.4 F (36.9 C)   97.7 F (36.5 C)  TempSrc: Oral   Oral  SpO2: 96% 98% 100% 99%  Weight:      Height:        Intake/Output Summary (Last 24 hours) at 01/04/2024 0829 Last data filed at 01/04/2024 0640 Gross per 24 hour  Intake 938.73 ml  Output 800 ml  Net 138.73 ml      01/03/2024    4:42 AM 01/02/2024    6:00 AM 01/01/2024    5:00 AM  Last 3 Weights  Weight (lbs) 190 lb 11.2 oz 188 lb 7.9 oz 187 lb 2.7 oz  Weight (kg) 86.5 kg 85.5 kg 84.9 kg      Telemetry/ECG  Ectopic atrial rhythm with bradycardia and junctional rhythm - Personally Reviewed  Physical Exam  GEN: No acute distress.   Neck: No JVD Cardiac: RRR, no murmurs, rubs, or gallops.  Respiratory: Clear to auscultation bilaterally. GI: Soft, nontender, non-distended  MS: No edema  Assessment & Plan  #severe 3 vessel CAD - no good interventional options and she has declined CABG, though is now reconsidering. Will reach out to [facility] to finalize recommendations. - ASA 81 mg  - crestor  40 mg daily - no BB with history of heart block/resting bradycardia.   #LLL mass - underwent bronch yesterday with biopsy. Path pending, per primary team.   #Acute on  chronic diastolic HF #AKI #renal pelvis stone - creatinine improving, last dose of lasix  12/31/23. - weights are up 86.5>89.5 but she is net neg 1.2 L - she feels mildly edematous - will give lasix  po 80 mg once now and monitor impact    For questions or updates, please contact [facility] Please consult [email or web address] for contact info under       Signed, Bergen Magner A Anchor Dwan, MD

## 2024-01-04 NOTE — Progress Notes (Signed)
 Patient stood in front of the chair and took two steps to pivot to the bed on room air. Oxygen saturations remained 93%.

## 2024-01-04 NOTE — Evaluation (Signed)
 Physical Therapy Evaluation Patient Details Name: Kelly Gibson MRN: [id number] DOB: 09-11-1960 Today's Date: 01/04/2024  History of Present Illness  63 y.o. female presents to [facility] hospital on 12/30/2023 with chest pain. Pt recently underwent cardiac cath on 6.3.2025 showing 3 vessel occlusion. LLL mass-like consolidation found on imaging also. Pt underwent bronch on 6/9. Pt remains undecided on possible CABG. PMH includes HTN, HLD, DM, CVA, AV block, CKD III, CAD, depression, COPD.  Clinical Impression  Pt presents to PT with deficits in strength, power, functional mobility, balance. Pt is typically able to stand and pivot from bed to wheelchair unassisted. On this date pt demonstrates a posterior lean with standing attempts. Spouse is present and reports he is able to provide assistance for mobility upon initial discharge. Pt declines post-acute PT services. PT recommends discharge home when medically appropriate.        If plan is discharge home, recommend the following: A little help with walking and/or transfers;A lot of help with bathing/dressing/bathroom;Assistance with cooking/housework;Assist for transportation;Help with stairs or ramp for entrance   Can travel by private vehicle        Equipment Recommendations None recommended by PT  Recommendations for Other Services       Functional Status Assessment Patient has had a recent decline in their functional status and demonstrates the ability to make significant improvements in function in a reasonable and predictable amount of time.     Precautions / Restrictions Precautions Precautions: Fall Recall of Precautions/Restrictions: Intact Restrictions Weight Bearing Restrictions Per Provider Order: No      Mobility  Bed Mobility Overal bed mobility: Needs Assistance Bed Mobility: Supine to Sit     Supine to sit: Min assist, HOB elevated          Transfers Overall transfer level: Needs assistance Equipment used: 1  person hand held assist Transfers: Sit to/from Stand, Bed to chair/wheelchair/BSC Sit to Stand: Min assist   Step pivot transfers: Min assist       General transfer comment: posterior lean with all transfers, corrected with minA for anterior weight shift    Ambulation/Gait                  Stairs            Wheelchair Mobility     Tilt Bed    Modified Rankin (Stroke Patients Only)       Balance Overall balance assessment: Needs assistance Sitting-balance support: No upper extremity supported, Feet supported Sitting balance-Leahy Scale: Good     Standing balance support: Single extremity supported, Reliant on assistive device for balance Standing balance-Leahy Scale: Poor                               Pertinent Vitals/Pain      Home Living Family/patient expects to be discharged to:: Private residence Living Arrangements: Spouse/significant other Available Help at Discharge: Family;Available 24 hours/day Type of Home: House Home Access: Level entry     Alternate Level Stairs-Number of Steps: has a chair lift on stairs but does not go up them Home Layout: Two level;Able to live on main level with bedroom/bathroom;Full bath on main level Home Equipment: BSC/3in1;Wheelchair - manual;Shower seat;Toilet riser;Adaptive equipment (hemi-walker) Additional Comments: R ankle brace    Prior Function Prior Level of Function : Needs assist             Mobility Comments: pt reports independence with transfers to wheelchair,  mobilizes within the home independently in wheelchair, is able to ambulate for short household distances with hemi walker ADLs Comments: spouse assist as needed     Extremity/Trunk Assessment   Upper Extremity Assessment Upper Extremity Assessment: RUE deficits/detail RUE Deficits / Details: RUE in decorticate positioning, chronic contractures from prior CVA    Lower Extremity Assessment Lower Extremity Assessment:  RLE deficits/detail RLE Deficits / Details: extensor tone throughout, flexion only with A.    Cervical / Trunk Assessment Cervical / Trunk Assessment: Normal  Communication   Communication Communication: Impaired Factors Affecting Communication: Difficulty expressing self    Cognition Arousal: Alert Behavior During Therapy: WFL for tasks assessed/performed   PT - Cognitive impairments: No apparent impairments                         Following commands: Intact       Cueing Cueing Techniques: Verbal cues     General Comments General comments (skin integrity, edema, etc.): pt in NAD, work of breathing increases with transfers although SpO2 in mid 90s    Exercises     Assessment/Plan    PT Assessment Patient needs continued PT services  PT Problem List Decreased strength;Decreased activity tolerance;Decreased balance;Decreased mobility;Decreased knowledge of use of DME       PT Treatment Interventions DME instruction;Gait training;Functional mobility training;Therapeutic activities;Balance training;Therapeutic exercise;Neuromuscular re-education;Patient/family education;Wheelchair mobility training    PT Goals (Current goals can be found in the Care Plan section)  Acute Rehab PT Goals Patient Stated Goal: to return home PT Goal Formulation: With patient/family Time For Goal Achievement: 01/18/24 Potential to Achieve Goals: Good    Frequency Min 2X/week     Co-evaluation               AM-PAC PT "6 Clicks" Mobility  Outcome Measure Help needed turning from your back to your side while in a flat bed without using bedrails?: A Little Help needed moving from lying on your back to sitting on the side of a flat bed without using bedrails?: A Little Help needed moving to and from a bed to a chair (including a wheelchair)?: A Little Help needed standing up from a chair using your arms (e.g., wheelchair or bedside chair)?: A Little Help needed to walk in  hospital room?: A Lot Help needed climbing 3-5 steps with a railing? : Total 6 Click Score: 15    End of Session   Activity Tolerance: Patient tolerated treatment well Patient left: in chair;with call bell/phone within reach;with family/visitor present Nurse Communication: Mobility status PT Visit Diagnosis: Muscle weakness (generalized) (M62.81);Other abnormalities of gait and mobility (R26.89)    Time: 4098-1191 PT Time Calculation (min) (ACUTE ONLY): 10 min   Charges:   PT Evaluation $PT Eval Low Complexity: 1 Low   PT General Charges $$ ACUTE PT VISIT: 1 Visit         Rexie Catena, PT, DPT Acute Rehabilitation Office [phone]   Rexie Catena 01/04/2024, 5:29 PM

## 2024-01-04 NOTE — Progress Notes (Signed)
 Patient with slight wheezing while ambulating into the wheelchair for discharge. Patient had an oxygen screening with RN and then worked with physical therapy without issues with saturation. Patient stated that she felt fine and would like to go home. Dr. Butch Cashing notified of event.

## 2024-01-04 NOTE — Progress Notes (Signed)
     [address]       [place] [address]             [phone]       Spoke with pt and husband today. She remains undecided   Will check back later  [employer]

## 2024-01-04 NOTE — Discharge Summary (Signed)
 Physician Discharge Summary   Patient: Kelly Gibson MRN: [id number] DOB: [birth year]  Admit date:     12/30/2023  Discharge date: 01/04/24  Discharge Physician: Aisha Hove   PCP: Annella Kief, NP   Recommendations at discharge:   PCP follow up in 1 week. Cardiology, CVTS follow up as scheduled. Pulmonary follow up for bronch findings and further management. Nephrology and urology follow up as scheduled.  Discharge Diagnoses: Principal Problem:   Mass of lower lobe of left lung Active Problems:   Type 2 diabetes mellitus with diabetic neuropathy, with long-term current use of insulin  (HCC)   COPD (chronic obstructive pulmonary disease) (HCC)   Hemiparesis affecting right side as late effect of cerebrovascular accident (CVA) (HCC)   Dysphagia, post-stroke   Chronic kidney disease (CKD), stage III (moderate) (HCC)   Hyperlipidemia associated with type 2 diabetes mellitus (HCC)   Hypertension associated with diabetes (HCC)   Major depression in remission (HCC)   Heart block AV complete (HCC)   Coronary artery calcification   Sick sinus syndrome (HCC)   Unstable angina (HCC)   Mediastinal adenopathy  Resolved Problems:   * No resolved hospital problems. *  Hospital Course: Kelly Gibson is a 63 y.o. female with medical history significant of hypertension, hyperlipidemia, diabetes, CVA with residual hemiparesis and dysphagia, AV block, CKD 3, CAD, depression, COPD presenting with chest pain.   Patient underwent recent cardiac cath 12/28/23 for further workup of issues with shortness of breath and admission where she was found to have significant elevated troponin.  At that time she was experiencing sepsis and catheterization was not pursued but she did receive aspirin , Plavix , heparin .  There was concern there for intermittent complete heart block and as she recovered she requested transfer to [facility].  Course they are also complicated by a staghorn calculus with  left nephrostomy tube having been placed.   Ultimately cardiac cath was deferred and patient underwent Myoview  on 5/22 showing high risk study and catheterization was performed 2 days ago.  Found to have three-vessel disease with 3 chronic total occlusions with collaterals.  Concerned that her AV block may be due to ischemia.  She was evaluated for possible pacemaker placement however she was noted to have wandering atrial pacemaker with good junctional rhythm and was asymptomatic so there was not felt to be currently an indication for pacemaker placement per EP.   Presented with recurrent/ increased intermittent chest pain since her catheterization admitted to [facility] service for further management. She is found to have LLL dense mass like lesion, pulmonology performed bronchoscopy. Cardiology and CVTS on board followed no plan made yet for CABG as patient undecided. Pulmonology advised oral Augmentin  therapy and outpatient follow up for bronch results.   Assessment and Plan: CAD Unstable angina versus NSTEMI Patient presented with increased intermittent chest pain with associated nausea and vomiting since catheterization 12/28/23. That time showed complex anatomy with three-vessel chronic total occlusion with some collateral blood flow. Troponin elevation less than prior admission. Cardiology and CT surgery on board, she is undecided on CABG. I advised to follow CVTS as outpatient. Continue home aspirin  and rosuvastatin .   Left lower lobe density Pneumonia vs malignancy Persistent left lower lobe masslike consolidation on chest x-ray. CT chest with out contrast showed mass like density 6.1 x 3.9 cm ?infiltrate vs malignancy. S/p Flexible video fiberoptic bronchoscopy with endobronchial ultrasound, robotic assisted navigation and biopsies.  S/p SLP - barium swallow done recommended regular diet with thin liquids. Rocephin +  azithro transitioned to Augmentin  for 7 days.  Pulmonology follow up as  outpatient for bronch results follow up.   AKI on CKD 3 Creatinine improved. Renal sono ruled out obstruction, has left renal pelvis non obstructing calculus. Continue to monitor renal function as outpaitent with nephrology follow up as scheduled. Avoid nephrotoxic drugs.   Chronic diastolic CHF Volume overload Last LVEF 50-55%, G1 DD. Currently only prescribed Lasix  as needed but was told by cardiology recently to hold off on Lasix  per patient. Concerned that she may have some mild exacerbation of chronic diastolic CHF. got IV Lasix  which she tolerated well. Kidney function improved, she takes Lasix  20mg  at home as needed. Advised to follow Cardiology as outpatient.   History of staghorn calculus Renal uls showed - Left renal pelvis non obstructing stone, no hydro, Urinalysis unremarkable. Blood cultures negative. This can be followed outpatient once cardiac issues resolved.   AV dissociation Outpatient seen by cardiac electrophysiology who felt that she was likely having some degree of wandering atrial pacemaker. No acute need for pacemaker was noted.  Supportive care. Continue to hold any nodal blocking agents   Hypertension Continue home hydralazine .   Type 2 Diabetes mellitus Resumed home insulin  regimen.   History of CVA History of dysphagia post CVA and also has residual right hemiparesis. Continue ASA, rosuvastatin . Swallow study unremarkable. She is on regular consistency diet.   Depression Continue home Celexa    COPD Continue home Breztri  and albuterol .   Obesity Class I: BMI 33.39 Diet, exercise and weight reduction advised.      Consultants: Cardiology, CVTS, Pulmonology. Procedures performed: Flexible video fiberoptic bronchoscopy with endobronchial ultrasound, robotic assisted navigation and biopsies.  Disposition: Home health Diet recommendation:  Discharge Diet Orders (From admission, onward)     Start     Ordered   01/04/24 0000  Diet - low  sodium heart healthy        01/04/24 1634   01/04/24 0000  Diet Carb Modified        01/04/24 1634           Cardiac and Carb modified diet DISCHARGE MEDICATION: Allergies as of 01/04/2024       Reactions   Latex Itching, Rash   Niacin Nausea And Vomiting        Medication List     STOP taking these medications    amLODipine  5 MG tablet Commonly known as: NORVASC    tiZANidine  2 MG tablet Commonly known as: ZANAFLEX        TAKE these medications    amoxicillin -clavulanate 875-125 MG tablet Commonly known as: AUGMENTIN  Take 1 tablet by mouth 2 (two) times daily.   aspirin  EC 81 MG tablet Take 1 tablet (81 mg total) by mouth daily. Swallow whole.   Breztri  Aerosphere 160-9-4.8 MCG/ACT Aero inhaler Generic drug: budesonide -glycopyrrolate -formoterol  Inhale 2 puffs into the lungs 2 (two) times daily.   citalopram  10 MG tablet Commonly known as: CELEXA  TAKE 1 TABLET BY MOUTH DAILY   Dexcom G7 Sensor Misc Take 1 Device by mouth See admin instructions. Change sensor every 10 days   furosemide  20 MG tablet Commonly known as: LASIX  Take 1 tablet (20 mg total) by mouth daily as needed. What changed: reasons to take this   HumaLOG  KwikPen 100 UNIT/ML KwikPen Generic drug: insulin  lispro INJECT 3 TO 5 UNITS INTO THE SKIN DAILY BEFORE LUNCH What changed: See the new instructions.   hydrALAZINE  100 MG tablet Commonly known as: APRESOLINE  Take 1 tablet (100 mg total) by  mouth 2 (two) times daily.   Melatonin 5 MG Chew Chew 10 mg by mouth at bedtime.   Multivitamin Gummies Womens Chew Chew 2 each by mouth in the morning.   olmesartan 40 MG tablet Commonly known as: BENICAR Take 40 mg by mouth in the morning.   rosuvastatin  40 MG tablet Commonly known as: CRESTOR  TAKE 1 TABLET BY MOUTH DAILY What changed:  how much to take how to take this when to take this additional instructions   Tradjenta  5 MG Tabs tablet Generic drug: linagliptin  Take 5 mg by  mouth daily.   Tresiba  FlexTouch 100 UNIT/ML FlexTouch Pen Generic drug: insulin  degludec Inject 10 Units into the skin 2 (two) times daily. Adjust by 2 units every 3 days as needed for blood sugars higher than 150 in the morning fasting. What changed:  how much to take additional instructions   UNABLE TO FIND Take 1 tablet by mouth with breakfast, with lunch, and with evening meal. GlucoGold -Berberine, Concentrated Cinnamon, Chromium, Banaba Leaf Extract   UNISOM PO Take 2 each by mouth at bedtime. Gel capsules   Ventolin  HFA 108 (90 Base) MCG/ACT inhaler Generic drug: albuterol  Inhale 2 puffs into the lungs every 6 (six) hours as needed for wheezing or shortness of breath.               Durable Medical Equipment  (From admission, onward)           Start     Ordered   01/04/24 1627  DME Oxygen  Once       Question Answer Comment  Length of Need Lifetime   Mode or (Route) Nasal cannula   Liters per Minute 2   Frequency Continuous (stationary and portable oxygen unit needed)   Oxygen conserving device Yes   Oxygen delivery system Gas      01/04/24 1634            Discharge Exam: Filed Weights   01/02/24 0600 01/03/24 0442 01/04/24 0700  Weight: (P) 85.5 kg 86.5 kg 89.5 kg      01/04/2024    3:20 PM 01/04/2024   11:10 AM 01/04/2024    7:08 AM  Vitals with BMI  Systolic 142 149 829  Diastolic 56 59 57  Pulse 56 57 57   General - Middle aged [place] American female, no apparent distress HEENT - PERRLA, EOMI, atraumatic head, non tender sinuses. Lung - Clear, basal rales, rhonchi, no wheezes. Heart - S1, S2 heard, no murmurs, rubs, trace pedal edema. Abdomen - Soft, non tender, bowel sounds 3 Neuro - Alert, awake and oriented x 3, right sided weakness. Skin - Warm and dry.  Condition at discharge: stable  The results of significant diagnostics from this hospitalization (including imaging, microbiology, ancillary and laboratory) are listed below  for reference.   Imaging Studies: DG Swallowing Func-Speech Pathology Result Date: 01/04/2024 Table formatting from the original result was not included. Modified Barium Swallow Study Patient Details Name: TIHANNA GOODSON MRN: [id number] Date of Birth: 1961/02/27 Today's Date: 01/04/2024 HPI/PMH: HPI: 63 yo female with recent admission for NSTEMI, E. coli bacteremia and intermittent heart block, mild to have an obstructing kidney stone.  Troponin is markedly elevated into the 15,000's.  She underwent outpatient nuclear stress testing for cardiac risk assessment and had significant reversible anterior ischemia.  She had outpatient cardiac catheterization but no intervention although bypass grafting is considered.  She now presents back 2 days later with chest pain, nausea and vomiting.  Persistent left lower lobe infiltrate first noted May 2025 during hospitalization in [place]  for E. coli bacteremia.  Of note, chest x-ray from October 2024 shows left lower lobe alveolar process. She was a 1 pack/day smoker until she quit in 2017, about 20 pack years .  Concern is for malignancy per MD as well as recurrent aspiration.  She has been on regular diet even though she was advised on a dysphagia diet after her stroke and she clearly provides a history of excessive aspiration and choking on her food. Patient seen by SLP 12/28/2023 with recommendations for a regular diet. Discharged from CIR in 2019 on a regular diet, thin liquid. Clinical Impression: Clinical Impression: Patient presents with a mild oral dysphagia characterized by left sided facial assymetry and decreased strength resulting in mildly prolonged mastication of regular texture solids and mildly decreased oral containment of thin liquids, at times with swallow delayed to the pyriform sinuses which is suspected to be baseline from previous CVA. She is able to fully protect her airway however with no penetration or aspiration observed and with full oral and  pharyngeal clearance. Patient may remain on a regular diet, thin liquid. No acute f/u indicated at this time. Factors that may increase risk of adverse event in presence of aspiration [name]): No data recorded Recommendations/Plan: Swallowing Evaluation Recommendations Swallowing Evaluation Recommendations Recommendations: PO diet PO Diet Recommendation: Regular; Thin liquids (Level 0) Liquid Administration via: Cup; Straw Medication Administration: Whole meds with liquid Supervision: Patient able to self-feed Swallowing strategies  : Slow rate; Small bites/sips Postural changes: Position pt fully upright for meals Oral care recommendations: Oral care BID (2x/day) Treatment Plan Treatment Plan Treatment recommendations: No treatment recommended at this time Follow-up recommendations: No SLP follow up Functional status assessment: Patient has not had a recent decline in their functional status. Recommendations Recommendations for follow up therapy are one component of a multi-disciplinary discharge planning process, led by the attending physician.  Recommendations may be updated based on patient status, additional functional criteria and insurance authorization. Assessment: Orofacial Exam: Orofacial Exam Oral Cavity: Oral Hygiene: WFL Oral Cavity - Dentition: Adequate natural dentition Orofacial Anatomy: WFL Oral Motor/Sensory Function: Suspected cranial nerve impairment CN VII - Facial: Left motor impairment (from previous CVA) Anatomy: Anatomy: WFL Boluses Administered: Boluses Administered Boluses Administered: Thin liquids (Level 0); Mildly thick liquids (Level 2, nectar thick); Puree; Solid  Oral Impairment Domain: Oral Impairment Domain Lip Closure: No labial escape Tongue control during bolus hold: Posterior escape of less than half of bolus Bolus preparation/mastication: Slow prolonged chewing/mashing with complete recollection Bolus transport/lingual motion: Brisk tongue motion Oral residue:  Complete oral clearance Initiation of pharyngeal swallow : Pyriform sinuses  Pharyngeal Impairment Domain: Pharyngeal Impairment Domain Soft palate elevation: No bolus between soft palate (SP)/pharyngeal wall (PW) Laryngeal elevation: Complete superior movement of thyroid cartilage with complete approximation of arytenoids to epiglottic petiole Anterior hyoid excursion: Complete anterior movement Epiglottic movement: Complete inversion Laryngeal vestibule closure: Complete, no air/contrast in laryngeal vestibule Pharyngeal stripping wave : Present - complete Pharyngeal contraction (A/P view only): N/A Pharyngoesophageal segment opening: Complete distension and complete duration, no obstruction of flow Tongue base retraction: No contrast between tongue base and posterior pharyngeal wall (PPW) Pharyngeal residue: Complete pharyngeal clearance  Esophageal Impairment Domain: Esophageal Impairment Domain Esophageal clearance upright position: Complete clearance, esophageal coating Pill: Pill Consistency administered: Thin liquids (Level 0) Thin liquids (Level 0): [facility] Penetration/Aspiration Scale Score: Penetration/Aspiration Scale Score 1.  Material does not enter airway: Thin  liquids (Level 0); Mildly thick liquids (Level 2, nectar thick); Puree; Solid; Pill Compensatory Strategies: No data recorded  General Information: Caregiver present: Yes  Diet Prior to this Study: Regular; Thin liquids (Level 0)   Temperature : Normal   Respiratory Status: WFL   Supplemental O2: Nasal cannula   History of Recent Intubation: No  Behavior/Cognition: Alert; Cooperative Self-Feeding Abilities: Able to self-feed Baseline vocal quality/speech: Normal Volitional Cough: Able to elicit Volitional Swallow: Able to elicit Exam Limitations: No limitations Goal Planning: No data recorded No data recorded No data recorded No data recorded No data recorded Pain: Pain Assessment Pain Assessment: No/denies pain End of Session: Start Time:SLP Start  Time (ACUTE ONLY): 0930 Stop Time: SLP Stop Time (ACUTE ONLY): 0945 Time Calculation:SLP Time Calculation (min) (ACUTE ONLY): 15 min Charges: SLP Evaluations $ SLP Speech Visit: 1 Visit SLP Evaluations $MBS Swallow: 1 Procedure SLP visit diagnosis: SLP Visit Diagnosis: Dysphagia, unspecified (R13.10) Past Medical History: Past Medical History: Diagnosis Date  Bradycardia   SR/SB with up to 3.5 second pause (asymptomatic) on ~ 10/2017 event monitor; saw EP Dr. Nunzio Belch: avoid AV nodal blocking agents  Chronic kidney disease   COPD (chronic obstructive pulmonary disease) (HCC)   Depression   Depression with anxiety 08/29/2021  Diabetes mellitus   Dysarthria, post-stroke   Dysrhythmia   Ganglion cyst 03/09/2012  History of kidney stones   Hyperlipidemia   Hypertension   Hypokalemia   Myocardial infarction (HCC)   NSTEMI  Obesity, unspecified 12/17/2011  Pneumonia   Pneumonia of left lower lobe due to infectious organism 12/12/2023  PONV (postoperative nausea and vomiting)   Skin lesion of face 11/17/2022  Stroke [facility])   2019   right side hemiparesis Past Surgical History: Past Surgical History: Procedure Laterality Date  CESAREAN SECTION    x 3  LEFT HEART CATH AND CORONARY ANGIOGRAPHY N/A 12/28/2023  Procedure: LEFT HEART CATH AND CORONARY ANGIOGRAPHY;  Surgeon: Cody Das, MD;  Location: [facility] INVASIVE CV LAB;  Service: Cardiovascular;  Laterality: N/A;  ORIF WRIST FRACTURE Right 05/04/2018  Procedure: OPEN REDUCTION INTERNAL FIXATION (ORIF) RIGHT  WRIST FRACTURE;  Surgeon: Micheline Ahr, MD;  Location: [facility] OR;  Service: Orthopedics;  Laterality: Right;  TUMOR REMOVAL    left shoulder Delsa Fife MA, [name] McCoy Leah [name] 01/04/2024, 10:21 AM  DG Chest Port 1 View Result Date: 01/03/2024 CLINICAL DATA:  [id number] S/P bronchoscopy with biopsy [id number]. EXAM: PORTABLE CHEST 1 VIEW COMPARISON:  12/30/2023. FINDINGS: Low lung volume. Mild diffuse pulmonary vascular congestion,, improved since the prior study.  Redemonstration of heterogeneous left retrocardiac opacity, grossly similar to the prior study. Please refer to CT scan chest from 12/31/2023 for details. Bilateral lung fields are otherwise clear. Bilateral costophrenic angles are clear. No pneumothorax. Stable cardio-mediastinal silhouette. No acute osseous abnormalities. The soft tissues are within normal limits. IMPRESSION: 1. No pneumothorax. 2. Mild diffuse pulmonary vascular congestion, improved since the prior study. 3. Redemonstration of heterogeneous left retrocardiac opacity, grossly similar to the prior study. Electronically Signed   By: Beula Brunswick M.D.   On: 01/03/2024 15:44   DG C-ARM BRONCHOSCOPY Result Date: 01/03/2024 C-ARM BRONCHOSCOPY: Fluoroscopy was utilized by the requesting physician.  No radiographic interpretation.   US  RENAL Result Date: 12/31/2023 CLINICAL DATA:  LEFT renal calculus EXAM: RENAL / URINARY TRACT ULTRASOUND COMPLETE COMPARISON:  Renal stone protocol CT 12/12/2023 FINDINGS: Right Kidney: Renal measurements: 6.8 x 3.6 x 3.1 cm = volume: 36 mL. Echogenicity within normal limits. No mass or  hydronephrosis visualized. Left Kidney: Renal measurements: 9.9 x 6.1 x 3.8 cm = volume: 119 mL. Echogenicity within normal limits. No hydronephrosis. Nonobstructing calculi again seen within the renal pelvis measuring 11 mm and within the lower pole calyx measuring 9 mm. Bladder: Appears normal for degree of bladder distention. Other: None. IMPRESSION: 1. No hydronephrosis. 2. Nonobstructing calculi seen within the LEFT renal pelvis and lower pole calyx. Electronically Signed   By: Elester Grim M.D.   On: 12/31/2023 16:31   CT CHEST WO CONTRAST Result Date: 12/31/2023 CLINICAL DATA:  Pulmonary hypertension, respiratory illness, shortness of breath, left lower lobe consolidation * Tracking Code: BO * EXAM: CT CHEST WITHOUT CONTRAST TECHNIQUE: Multidetector CT imaging of the chest was performed following the standard protocol without  IV contrast. RADIATION DOSE REDUCTION: This exam was performed according to the departmental dose-optimization program which includes automated exposure control, adjustment of the mA and/or kV according to patient size and/or use of iterative reconstruction technique. COMPARISON:  Chest radiograph, 12/30/2023 FINDINGS: Cardiovascular: Aortic atherosclerosis. Normal heart size. Three-vessel coronary artery calcifications. No pericardial effusion. Mediastinum/Nodes: Prominent mediastinal lymph nodes, pretracheal nodes measuring up to 1.4 x 1.4 cm. Thyroid gland, trachea, and esophagus demonstrate no significant findings. Lungs/Pleura: Dense, masslike consolidation of the dependent left lower lobe measuring up to 6.1 x 3.9 cm with internal air bronchograms (series 4, image 93). Mild interlobular septal thickening in the lung bases. Small left pleural effusion. Scattered areas of bandlike scarring in both lung bases. Upper Abdomen: No acute abnormality. Musculoskeletal: No chest wall abnormality. No acute osseous findings. IMPRESSION: 1. Dense, masslike consolidation of the dependent left lower lobe measuring up to 6.1 x 3.9 cm with internal air bronchograms. This may reflect infection or aspiration but is generally somewhat worrisome for malignancy. Recommend short interval follow-up to ensure resolution. 2. Mild interlobular septal thickening in the lung bases, consistent with mild pulmonary edema. 3. Small left pleural effusion. 4. Prominent mediastinal lymph nodes, likely reactive. Attention on follow-up. 5. Coronary artery disease. Aortic Atherosclerosis (ICD10-I70.0). Electronically Signed   By: Fredricka Jenny M.D.   On: 12/31/2023 11:57   DG Chest 2 View Result Date: 12/30/2023 CLINICAL DATA:  cp EXAM: CHEST - 2 VIEW COMPARISON:  Dec 12, 2023 FINDINGS: Diffuse interstitial opacities throughout the lungs. Patchy airspace opacities in the right suprahilar upper lobe and the left lung, mostly in the left lower  lobe. No pleural effusion or pneumothorax. Mild cardiomegaly. Aortic atherosclerosis. No acute fracture or destructive lesion. Multilevel thoracic osteophytosis. IMPRESSION: Cardiomegaly with findings of either asymmetric pulmonary edema or multifocal pneumonia. Given the persistence of the masslike consolidation in the left lower lobe on the prior CT of the abdomen and pelvis, a follow-up chest CT with IV contrast is recommended. Electronically Signed   By: Rance Burrows M.D.   On: 12/30/2023 11:26   CARDIAC CATHETERIZATION Result Date: 12/28/2023 Images from the original result were not included. Coronary angiography 12/28/2023: LM: No significant disease LAD: Moderate sized vessel.          Mid vessel occlusion with moderate calcification, after a large septal branch          Distal vessel filled with collaterals from septal branches Lcx: Moderate sized vessel         Small OM1 with prox 70% stenosis         Medium caliber OM2 with prox occlusion         Distal vessel filled with collaterals from septal branches RCA: Small to medium  caliber dominant vessel with prox diffuse 50% disease           Mid vessel occlusion           Distal RCA/RPDA filled with collaterals from septal branches LVEDP 18 mmHg Extremely complex situation in this patient with multiple comorbidities including prior stroke with right-sided hemiparesis, diabetes.  Her shortness of breath is new since her recent hospitalization when she had non-STEMI in the setting of urosepsis due to staghorn calculus that requires definitive surgery at some point by urology, pending cardiac risk stratification and optimization.  Echocardiogram showed inferior akinesis but preserved LVEF around 55%.  Stress test showed no infarct, but anterior ischemia with stress EF 44%.  Coronary angiogram today shows 3 distinct occlusions of mid LAD, proximal OM 2, mid RCA with faint collateralization. In addition, she has at least isorhythmic A-V dissociation, if not  intermittent complete AV block on EKG.  I suspect this is also ischemic in etiology.  She is hemodynamically stable at this time. Ideally, her coronary anatomy is suitable for surgical revascularization.  However, multiple comorbidities could limit surgical candidacy.  I would like to discuss her case in multidisciplinary heart team meeting for further decision-making. I will obtain a twelve-lead EKG.  She had similar appearance on EKG last week in the office as well.  She is hemodynamically stable from her A-V dissociation.  I will consider discharging home on a monitor, versus inpatient EP consult, after obtaining the EKG. Cody Das, MD  MYOCARDIAL PERFUSION/CT RAD READ Result Date: 12/20/2023 CLINICAL DATA:  This over-read does not include interpretation of cardiac or coronary anatomy or pathology. The cardiac SPECT CT interpretation by the cardiologist is attached. COMPARISON:  CT chest May 16, 2019 FINDINGS: Vascular: Aortic atherosclerosis. Mediastinum/Nodes: Stable prominent/mildly enlarged lymph nodes for instance a right paratracheal lymph node measuring 13 mm in short axis on image 22/2, unchanged. Lungs/Pleura: Irregular nodular consolidation with bronchial wall thickening in the left lower lobe, suggest further evaluation by dedicated chest CT. Upper Abdomen: Cholelithiasis. Musculoskeletal: Multilevel degenerative change of the spine. IMPRESSION: 1. Irregular nodular consolidation with bronchial wall thickening in the left lower lobe, suggest further evaluation by dedicated chest CT. 2. Stable prominent/mildly enlarged mediastinal lymph nodes, nonspecific but favored reactive. 3. Cholelithiasis. 4.  Aortic Atherosclerosis (ICD10-I70.0). Electronically Signed   By: Tama Fails M.D.   On: 12/20/2023 12:52   MYOCARDIAL PERFUSION IMAGING Result Date: 12/16/2023   Findings are consistent with ischemia. The study is high risk.   No ST deviation was noted. The ECG was not diagnostic due  to pharmacologic protocol.   LV perfusion is abnormal. There is evidence of ischemia. There is no evidence of infarction. Defect 1: There is a large defect with severe reduction in uptake present in the apical to basal anterior, anterolateral, anteroseptal, lateral and apex location(s) that is reversible. Viability is present. There is abnormal wall motion in the defect area. Consistent with ischemia.   Left ventricular function is abnormal. Global function is moderately reduced. Nuclear stress EF: 44%. The left ventricular ejection fraction is moderately decreased (30-44%). End diastolic cavity size is mildly enlarged. End systolic cavity size is normal.   CT images were obtained for attenuation correction and were examined for the presence of coronary calcium  when appropriate.   Coronary calcium  was present on the attenuation correction CT images. Severe coronary calcifications were present. Coronary calcifications were present in the left anterior descending artery distribution(s) and to a lesser extent in the LCX and  RCA. Aortic atherosclerosis was also noted.   Prior study not available for comparison.   ECG rhythm shows normal sinus rhythm. Large size, severe severity reversible anterior, anteroseptal, anterolateral, lateral and apical perfusion defect, suggestive of LAD territory/diagonal ischemia (SDS 9). LVEF 44% with severe hypokinesis corresponding to the areas of perfusion defect. CT imaging demonstrates multivessel coronary calcium  with severe LAD and diagonal branch calcification. Overall this is a high risk study given the size of the perfusion defect. No prior study available for comparison. Definitive cardiac catheterization is recommended.   ECHOCARDIOGRAM LIMITED Result Date: 12/12/2023    ECHOCARDIOGRAM LIMITED REPORT   Patient Name:   DRUE CAMERA Date of Exam: 12/12/2023 Medical Rec #:  [id number]         Height:       63.0 in Accession #:    [id number]        Weight:       194.9 lb Date  of Birth:  [birth year]        BSA:          1.912 m Patient Age:    62 years          BP:           157/64 mmHg Patient Gender: F                 HR:           49 bpm. Exam Location:  Inpatient Procedure: 2D Echo, Limited Echo, Cardiac Doppler and Color Doppler (Both            Spectral and Color Flow Doppler were utilized during procedure). Indications:    NSTEMI  History:        Patient has prior history of Echocardiogram examinations, most                 recent 10/27/2017.  Sonographer:    Janette Medley Referring Phys: [id number] KAMAL H HENDERSON IMPRESSIONS  1. Left ventricular ejection fraction, by estimation, is 50 to 55%. The left ventricle has low normal function. The left ventricle demonstrates regional wall motion abnormalities (see scoring diagram/findings for description). There is moderate left ventricular hypertrophy. Left ventricular diastolic parameters are consistent with Grade I diastolic dysfunction (impaired relaxation).  2. The aortic valve is calcified. There is moderate calcification of the aortic valve. Aortic valve regurgitation is not visualized.  3. The inferior vena cava is normal in size with greater than 50% respiratory variability, suggesting right atrial pressure of 3 mmHg. FINDINGS  Left Ventricle: Left ventricular ejection fraction, by estimation, is 50 to 55%. The left ventricle has low normal function. The left ventricle demonstrates regional wall motion abnormalities. There is moderate left ventricular hypertrophy. Left ventricular diastolic parameters are consistent with Grade I diastolic dysfunction (impaired relaxation).  LV Wall Scoring: The inferior wall and mid inferolateral segment are akinetic. Left Atrium: Left atrial size was normal in size. Aortic Valve: The aortic valve is calcified. There is moderate calcification of the aortic valve. Aortic valve regurgitation is not visualized. Aorta: The aortic root and ascending aorta are structurally normal, with no evidence of  dilitation. Venous: The inferior vena cava is normal in size with greater than 50% respiratory variability, suggesting right atrial pressure of 3 mmHg. LEFT VENTRICLE PLAX 2D LVIDd:         4.20 cm   Diastology LVIDs:         2.70 cm   LV e' medial:   6.96 cm/s LV  PW:         1.20 cm   LV E/e' medial: 13.9 LV IVS:        1.40 cm LVOT diam:     2.00 cm LVOT Area:     3.14 cm  RIGHT VENTRICLE            IVC RV S prime:     9.46 cm/s  IVC diam: 1.20 cm TAPSE (M-mode): 2.0 cm LEFT ATRIUM         Index LA diam:    3.50 cm 1.83 cm/m   AORTA Ao Root diam: 3.00 cm Ao Asc diam:  3.20 cm MITRAL VALVE MV Area (PHT): 2.26 cm     SHUNTS MV Decel Time: 335 msec     Systemic Diam: 2.00 cm MV E velocity: 96.40 cm/s MV A velocity: 113.00 cm/s MV E/A ratio:  0.85 Dorothye Gathers MD Electronically signed by Dorothye Gathers MD Signature Date/Time: 12/12/2023/11:47:27 AM    Final    DG Chest 2 View Result Date: 12/12/2023 CLINICAL DATA:  63 year old female code sepsis.  Cough.  NSTEMI. EXAM: CHEST - 2 VIEW COMPARISON:  Portable chest yesterday and earlier. CT Abdomen and Pelvis this morning. FINDINGS: Confluent left lower lobe, retrocardiac opacity seen on CT this morning. Lung volumes and mediastinal contours stable from yesterday. Linear thickening or scarring along the right minor fissure is stable. No pneumothorax or pulmonary edema. Stable lung volumes and mediastinal contours. Decreased bowel gas in the upper abdomen. Stable visualized osseous structures. IMPRESSION: 1. Left lower lobe Pneumonia vs.mass, visible on CT Abdomen and Pelvis this morning. No pleural effusion. Follow-up will be necessary to exclude Tumor. 2. Linear scarring or pleural thickening along the right minor fissure. 3. No other cardiopulmonary abnormality. Electronically Signed   By: Marlise Simpers M.D.   On: 12/12/2023 09:52   CT RENAL STONE STUDY Result Date: 12/12/2023 CLINICAL DATA:  History of nephrolithiasis with flank pain, initial encounter EXAM: CT ABDOMEN  AND PELVIS WITHOUT CONTRAST TECHNIQUE: Multidetector CT imaging of the abdomen and pelvis was performed following the standard protocol without IV contrast. RADIATION DOSE REDUCTION: This exam was performed according to the departmental dose-optimization program which includes automated exposure control, adjustment of the mA and/or kV according to patient size and/or use of iterative reconstruction technique. COMPARISON:  Ultrasound from 10/10/2021 FINDINGS: Lower chest: Chronic area of consolidation in the left lower lobe. Hepatobiliary: Cholelithiasis is noted. The liver is within normal limits. Pancreas: Unremarkable. No pancreatic ductal dilatation or surrounding inflammatory changes. Spleen: Normal in size without focal abnormality. Adrenals/Urinary Tract: Adrenal glands are within normal limits. Kidneys are well visualized bilaterally. Multiple renal calculi are noted on the left measuring up to 14 mm in the left renal pelvis. Mild fullness of the collecting system is noted secondary to the renal pelvic stone. The more distal left ureter is within normal limits. The bladder is within normal limits. Stomach/Bowel: Scattered fecal material is noted throughout the colon. No obstructive or inflammatory changes are noted. The appendix is within normal limits. No obstructive or inflammatory changes of the small bowel are seen. Stomach is within normal limits. Vascular/Lymphatic: Aortic atherosclerosis. No enlarged abdominal or pelvic lymph nodes. Reproductive: Uterus and bilateral adnexa are unremarkable. Other: No abdominal wall hernia or abnormality. No abdominopelvic ascites. Musculoskeletal: No acute or significant osseous findings. IMPRESSION: Left renal calculi with mild obstructive changes secondary to the 15 mm stone in the renal pelvis. No distal obstructive changes are seen. Cholelithiasis without complicating factors. Chronic  consolidation in the left lower lobe. Electronically Signed   By: Violeta Grey  M.D.   On: 12/12/2023 01:54   DG CHEST PORT 1 VIEW Result Date: 12/11/2023 CLINICAL DATA:  10026 Shortness of breath 10026 EXAM: PORTABLE CHEST 1 VIEW COMPARISON:  May 10, 2023 FINDINGS: The cardiomediastinal silhouette is unchanged and enlarged in contour.There is fullness of the RIGHT paratracheal stripe no pleural effusion. No pneumothorax. Band like opacities in the bilateral perihilar regions, most likely atelectasis. IMPRESSION: There is fullness of the RIGHT paratracheal stripe. This could reflect adenopathy versus summation artifact. Recommend repeat PA and lateral chest radiograph for improved evaluation when clinically appropriate. Electronically Signed   By: Clancy Crimes M.D.   On: 12/11/2023 17:35    Microbiology: Results for orders placed or performed during the hospital encounter of 12/30/23  Resp panel by RT-PCR (RSV, Flu A&B, Covid) Anterior Nasal Swab     Status: None   Collection Time: 12/30/23  2:11 PM   Specimen: Anterior Nasal Swab  Result Value Ref Range Status   SARS Coronavirus 2 by RT PCR NEGATIVE NEGATIVE Final   Influenza A by PCR NEGATIVE NEGATIVE Final   Influenza B by PCR NEGATIVE NEGATIVE Final    Comment: (NOTE) The Xpert Xpress SARS-CoV-2/FLU/RSV plus assay is intended as an aid in the diagnosis of influenza from Nasopharyngeal swab specimens and should not be used as a sole basis for treatment. Nasal washings and aspirates are unacceptable for Xpert Xpress SARS-CoV-2/FLU/RSV testing.  Fact Sheet for Patients: BloggerCourse.com  Fact Sheet for Healthcare Providers: SeriousBroker.it  This test is not yet approved or cleared by the [place]  FDA and has been authorized for detection and/or diagnosis of SARS-CoV-2 by FDA under an Emergency Use Authorization (EUA). This EUA will remain in effect (meaning this test can be used) for the duration of the COVID-19 declaration under Section 564(b)(1)  of the Act, 21 U.S.C. section 360bbb-3(b)(1), unless the authorization is terminated or revoked.     Resp Syncytial Virus by PCR NEGATIVE NEGATIVE Final    Comment: (NOTE) Fact Sheet for Patients: BloggerCourse.com  Fact Sheet for Healthcare Providers: SeriousBroker.it  This test is not yet approved or cleared by the [place]  FDA and has been authorized for detection and/or diagnosis of SARS-CoV-2 by FDA under an Emergency Use Authorization (EUA). This EUA will remain in effect (meaning this test can be used) for the duration of the COVID-19 declaration under Section 564(b)(1) of the Act, 21 U.S.C. section 360bbb-3(b)(1), unless the authorization is terminated or revoked.  Performed at [facility], [address], [place], [place] [address]   MRSA Next Gen by PCR, Nasal     Status: None   Collection Time: 12/30/23  6:17 PM   Specimen: Nasal Mucosa; Nasal Swab  Result Value Ref Range Status   MRSA by PCR Next Gen NOT DETECTED NOT DETECTED Final    Comment: (NOTE) The GeneXpert MRSA Assay (FDA approved for NASAL specimens only), is one component of a comprehensive MRSA colonization surveillance program. It is not intended to diagnose MRSA infection nor to guide or monitor treatment for MRSA infections. Test performance is not FDA approved in patients less than 19 years old. Performed at [facility], [address] [place], [place] [address]   Urine Culture (for pregnant, neutropenic or urologic patients or patients with an indwelling urinary catheter)     Status: Abnormal   Collection Time: 12/30/23  9:00 PM   Specimen: Urine, Clean Catch  Result Value Ref Range Status   Specimen Description URINE, CLEAN CATCH  Final   Special Requests   Final    NONE Performed at [facility], [address] [place], [place] [address]    Culture 20,000 COLONIES/mL STAPHYLOCOCCUS EPIDERMIDIS (A)  Final   Report  Status 01/01/2024 FINAL  Final   Organism ID, Bacteria STAPHYLOCOCCUS EPIDERMIDIS (A)  Final      Susceptibility   Staphylococcus epidermidis - MIC*    CIPROFLOXACIN >=8 RESISTANT Resistant     GENTAMICIN  <=0.5 SENSITIVE Sensitive     NITROFURANTOIN <=16 SENSITIVE Sensitive     OXACILLIN >=4 RESISTANT Resistant     TETRACYCLINE >=16 RESISTANT Resistant     VANCOMYCIN  <=0.5 SENSITIVE Sensitive     TRIMETH/SULFA 80 RESISTANT Resistant     RIFAMPIN <=0.5 SENSITIVE Sensitive     Inducible Clindamycin NEGATIVE Sensitive     * 20,000 COLONIES/mL STAPHYLOCOCCUS EPIDERMIDIS  Culture, BAL-quantitative w Gram Stain     Status: None (Preliminary result)   Collection Time: 01/03/24  2:51 PM   Specimen: Bronchial Alveolar Lavage; Respiratory  Result Value Ref Range Status   Specimen Description BRONCHIAL ALVEOLAR LAVAGE  Final   Special Requests LLL  Final   Gram Stain   Final    RARE WBC PRESENT, PREDOMINANTLY PMN NO ORGANISMS SEEN    Culture   Final    NO GROWTH < 24 HOURS Performed at [facility], [address], [place], [place] [address]    Report Status PENDING  Incomplete  Anaerobic culture w Gram Stain     Status: None (Preliminary result)   Collection Time: 01/03/24  2:51 PM   Specimen: Bronchoalveolar Lavage  Result Value Ref Range Status   Specimen Description BRONCHIAL ALVEOLAR LAVAGE  Final   Special Requests LLL  Final   Gram Stain   Final    RARE WBC PRESENT, PREDOMINANTLY PMN NO ORGANISMS SEEN Performed at [facility], [address], [place], [place] [address]    Culture PENDING  Incomplete   Report Status PENDING  Incomplete    Labs: CBC: Recent Labs  Lab 12/30/23 1036 12/31/23 0231 01/02/24 0212 01/03/24 0213 01/04/24 0241  WBC 6.8 5.6 6.5 6.7 5.9  HGB 11.8* 11.4* 11.1* 11.0* 10.1*  HCT 39.1 36.8 35.6* 35.9* 32.9*  MCV 90.5 86.8 87.0 87.6 87.3  PLT 315 316 293 286 290   Basic Metabolic Panel: Recent Labs  Lab 12/30/23 1036  12/31/23 0231 01/02/24 0212 01/03/24 0213 01/04/24 0241  NA 140 139 137 138 139  K 4.3 3.4* 3.8 3.9 4.2  CL 107 103 104 103 104  CO2 21* 23 24 25 26   GLUCOSE 160* 118* 115* 132* 112*  BUN 21 18 21 19 19   CREATININE 2.12* 2.24* 1.93* 1.63* 1.57*  CALCIUM  9.5 8.9 8.7* 9.1 8.9   Liver Function Tests: No results for input(s): "AST", "ALT", "ALKPHOS", "BILITOT", "PROT", "ALBUMIN" in the last 168 hours. CBG: Recent Labs  Lab 01/03/24 1651 01/03/24 2106 01/04/24 0539 01/04/24 1109 01/04/24 1620  GLUCAP 120* 146* 109* 173* 166*    Discharge time spent: 37 minutes.  Signed: Aisha Hove, MD [facility] 01/04/2024

## 2024-01-04 NOTE — Telephone Encounter (Signed)
 Please arrange hosp FU OV with RB/SG in 2-3 weeks

## 2024-01-05 ENCOUNTER — Telehealth: Payer: Self-pay

## 2024-01-05 ENCOUNTER — Other Ambulatory Visit: Payer: Self-pay | Admitting: Nurse Practitioner

## 2024-01-05 ENCOUNTER — Encounter (HOSPITAL_COMMUNITY): Payer: Self-pay | Admitting: Emergency Medicine

## 2024-01-05 DIAGNOSIS — I214 Non-ST elevation (NSTEMI) myocardial infarction: Secondary | ICD-10-CM | POA: Diagnosis not present

## 2024-01-05 DIAGNOSIS — I110 Hypertensive heart disease with heart failure: Secondary | ICD-10-CM | POA: Diagnosis not present

## 2024-01-05 DIAGNOSIS — E872 Acidosis, unspecified: Secondary | ICD-10-CM | POA: Diagnosis not present

## 2024-01-05 DIAGNOSIS — I495 Sick sinus syndrome: Secondary | ICD-10-CM | POA: Diagnosis not present

## 2024-01-05 DIAGNOSIS — J440 Chronic obstructive pulmonary disease with acute lower respiratory infection: Secondary | ICD-10-CM | POA: Diagnosis not present

## 2024-01-05 DIAGNOSIS — I5030 Unspecified diastolic (congestive) heart failure: Secondary | ICD-10-CM | POA: Diagnosis not present

## 2024-01-05 DIAGNOSIS — I69351 Hemiplegia and hemiparesis following cerebral infarction affecting right dominant side: Secondary | ICD-10-CM | POA: Diagnosis not present

## 2024-01-05 DIAGNOSIS — I2510 Atherosclerotic heart disease of native coronary artery without angina pectoris: Secondary | ICD-10-CM | POA: Diagnosis not present

## 2024-01-05 DIAGNOSIS — E1159 Type 2 diabetes mellitus with other circulatory complications: Secondary | ICD-10-CM | POA: Diagnosis not present

## 2024-01-05 DIAGNOSIS — E1140 Type 2 diabetes mellitus with diabetic neuropathy, unspecified: Secondary | ICD-10-CM | POA: Diagnosis not present

## 2024-01-05 DIAGNOSIS — I152 Hypertension secondary to endocrine disorders: Secondary | ICD-10-CM | POA: Diagnosis not present

## 2024-01-05 DIAGNOSIS — J188 Other pneumonia, unspecified organism: Secondary | ICD-10-CM | POA: Diagnosis not present

## 2024-01-05 DIAGNOSIS — F418 Other specified anxiety disorders: Secondary | ICD-10-CM

## 2024-01-05 NOTE — Transitions of Care (Post Inpatient/ED Visit) (Signed)
   01/05/2024  Name: URIAH TRUEBA MRN: [id number] DOB: 12/07/1960  Today's TOC FU Call Status: Today's TOC FU Call Status:: Unsuccessful Call (1st Attempt) Unsuccessful Call (1st Attempt) Date: 01/05/24  Attempted to reach the patient regarding the most recent Inpatient/ED visit. NOTE: DPR indicates patient has or had a legal guardian.   Follow Up Plan: Additional outreach attempts will be made to reach the patient to complete the Transitions of Care (Post Inpatient/ED visit) call.    [name] MSN, RN RN Case Sales executive Health  [facility] Office Hours M-F [phone] Direct Dial: [phone] Main Phone [phone]  Fax: [phone] [email or web address]

## 2024-01-05 NOTE — Telephone Encounter (Signed)
Patient scheduled with SG.

## 2024-01-05 NOTE — Telephone Encounter (Signed)
 Last apt 04/07/23.

## 2024-01-06 ENCOUNTER — Telehealth: Payer: Self-pay

## 2024-01-06 LAB — CULTURE, BAL-QUANTITATIVE W GRAM STAIN: Culture: NO GROWTH

## 2024-01-06 NOTE — Telephone Encounter (Signed)
 Copied from CRM [id number]. Topic: Clinical - Home Health Verbal Orders >> Jan 06, 2024 12:47 PM Oddis Bench wrote: Caller/Agency: [facility] Callback Number: [phone] Fax [phone] Service Requested: Occupational Therapy/PT Therapy Frequency: N/A Any new concerns about the patient? No

## 2024-01-06 NOTE — Transitions of Care (Post Inpatient/ED Visit) (Signed)
   01/06/2024  Name: Kelly Gibson MRN: [id number] DOB: 09-22-1960  Today's TOC FU Call Status: Today's TOC FU Call Status:: Unsuccessful Call (2nd Attempt) Unsuccessful Call (2nd Attempt) Date: 01/06/24  Attempted to reach the patient regarding the most recent Inpatient/ED visit.  Follow Up Plan: Additional outreach attempts will be made to reach the patient to complete the Transitions of Care (Post Inpatient/ED visit) call.    [name] MSN, RN RN Case Sales executive Health  [facility] Office Hours M-F [phone] Direct Dial: [phone] Main Phone [phone]  Fax: [phone] [email or web address]

## 2024-01-07 ENCOUNTER — Telehealth: Payer: Self-pay

## 2024-01-07 NOTE — Transitions of Care (Post Inpatient/ED Visit) (Signed)
   01/07/2024  Name: Kelly Gibson MRN: [id number] DOB: 1960-11-08  Today's TOC FU Call Status: Today's TOC FU Call Status:: Unsuccessful Call (3rd Attempt) Unsuccessful Call (3rd Attempt) Date: 01/07/24  Attempted to reach the patient regarding the most recent Inpatient/ED visit. Spouse answered but indicated patient was unavailable.   Follow Up Plan: No further outreach attempts will be made at this time. We have been unable to contact the patient.   [name] MSN, RN RN Case Sales executive Health  [facility] Office Hours M-F [phone] Direct Dial: [phone] Main Phone [phone]  Fax: [phone] [email or web address]

## 2024-01-08 LAB — ANAEROBIC CULTURE W GRAM STAIN

## 2024-01-10 ENCOUNTER — Ambulatory Visit: Admitting: Nurse Practitioner

## 2024-01-10 ENCOUNTER — Encounter: Payer: Self-pay | Admitting: Nurse Practitioner

## 2024-01-10 VITALS — BP 120/64 | HR 48

## 2024-01-10 DIAGNOSIS — E1169 Type 2 diabetes mellitus with other specified complication: Secondary | ICD-10-CM

## 2024-01-10 DIAGNOSIS — N184 Chronic kidney disease, stage 4 (severe): Secondary | ICD-10-CM

## 2024-01-10 DIAGNOSIS — E1159 Type 2 diabetes mellitus with other circulatory complications: Secondary | ICD-10-CM

## 2024-01-10 DIAGNOSIS — I442 Atrioventricular block, complete: Secondary | ICD-10-CM

## 2024-01-10 DIAGNOSIS — E1122 Type 2 diabetes mellitus with diabetic chronic kidney disease: Secondary | ICD-10-CM

## 2024-01-10 DIAGNOSIS — G4700 Insomnia, unspecified: Secondary | ICD-10-CM

## 2024-01-10 DIAGNOSIS — E785 Hyperlipidemia, unspecified: Secondary | ICD-10-CM | POA: Diagnosis not present

## 2024-01-10 DIAGNOSIS — N1832 Chronic kidney disease, stage 3b: Secondary | ICD-10-CM | POA: Diagnosis not present

## 2024-01-10 DIAGNOSIS — I69351 Hemiplegia and hemiparesis following cerebral infarction affecting right dominant side: Secondary | ICD-10-CM

## 2024-01-10 DIAGNOSIS — I200 Unstable angina: Secondary | ICD-10-CM

## 2024-01-10 DIAGNOSIS — F331 Major depressive disorder, recurrent, moderate: Secondary | ICD-10-CM

## 2024-01-10 DIAGNOSIS — J418 Mixed simple and mucopurulent chronic bronchitis: Secondary | ICD-10-CM | POA: Diagnosis not present

## 2024-01-10 DIAGNOSIS — I129 Hypertensive chronic kidney disease with stage 1 through stage 4 chronic kidney disease, or unspecified chronic kidney disease: Secondary | ICD-10-CM

## 2024-01-10 DIAGNOSIS — E1140 Type 2 diabetes mellitus with diabetic neuropathy, unspecified: Secondary | ICD-10-CM | POA: Diagnosis not present

## 2024-01-10 DIAGNOSIS — F325 Major depressive disorder, single episode, in full remission: Secondary | ICD-10-CM

## 2024-01-10 DIAGNOSIS — Z794 Long term (current) use of insulin: Secondary | ICD-10-CM | POA: Diagnosis not present

## 2024-01-10 DIAGNOSIS — I2510 Atherosclerotic heart disease of native coronary artery without angina pectoris: Secondary | ICD-10-CM

## 2024-01-10 MED ORDER — HYDRALAZINE HCL 100 MG PO TABS: ORAL_TABLET | ORAL | Status: DC

## 2024-01-10 NOTE — Patient Instructions (Addendum)
 Amlodipine  and Olmesartan each once a day. Hold the Hydralazine , but keep it on hand in case we start the see a rise.  If your blood pressure goes up higher than 150/95, then give the hydralazine .   Keep working on your diet to help keep your blood sugars under control. They are looking pretty good!!

## 2024-01-10 NOTE — Progress Notes (Signed)
 [name], DNP, AGNP-c [facility]  [address] [place], [place] [address] [phone]  ESTABLISHED PATIENT- Chronic Health and/or Follow-Up Visit  Blood pressure 120/64, pulse (!) 48, last menstrual period 12/10/2012, SpO2 95%.   History of Present Illness Kelly Gibson is a 63 year old female with coronary artery disease who presents for hospital follow-up after a recent heart attack.  She experienced a heart attack while out of town in [place]  and was transported back to [place]  via ambulance. She describes the ride as rough and expresses fear about the need for open heart surgery, specifically a bypass surgery. She plans to discuss it with her surgeon at an upcoming appointment.  She feels very tired and fatigued, experiencing shortness of breath and being 'very out of wind' at times. There was confusion regarding her blood pressure medications as they were held during a hospital stay, and a new medication, hydralazine , was introduced. She continues to take Lasix  as needed for swelling in her feet, which she reports helps.  She has a history of depression and is on medication for it, though she recently needed a refill. She also has diabetes, and feels her BG levels are well-controlled based on her CGM, and her estimated A1c is 7.3%.  She has a history of kidney stones and needs them removed, but this procedure is pending due to her heart condition. During her hospitalization a nephrostomy tube was placed and IVABX were given. She also experienced septicemia recently, which was unexpected as she did not feel any discomfort at the time.  She reports she does not have a reliable nebulizer at home.   All ROS negative with exception of what is listed above.   PHYSICAL EXAM Physical Exam Vitals and nursing note reviewed.  Constitutional:      General: She is not in acute distress.    Appearance: She is not diaphoretic.  HENT:     Head:  Normocephalic.   Eyes:     Conjunctiva/sclera: Conjunctivae normal.   Neck:     Vascular: No carotid bruit.   Cardiovascular:     Rate and Rhythm: Normal rate and regular rhythm.     Pulses: Normal pulses.     Heart sounds: Normal heart sounds.  Pulmonary:     Effort: Pulmonary effort is normal. No respiratory distress.     Breath sounds: Wheezing present. No rhonchi.  Abdominal:     General: There is no distension.     Palpations: Abdomen is soft.     Tenderness: There is no abdominal tenderness. There is no guarding.   Musculoskeletal:     Cervical back: No tenderness.     Right lower leg: No edema.     Left lower leg: No edema.  Lymphadenopathy:     Cervical: No cervical adenopathy.   Skin:    General: Skin is warm and dry.     Capillary Refill: Capillary refill takes less than 2 seconds.   Neurological:     Mental Status: She is alert and oriented to person, place, and time.     Cranial Nerves: Cranial nerve deficit present.     Sensory: Sensory deficit present.     Motor: Weakness present.     Coordination: Coordination abnormal.     Gait: Gait abnormal.   Psychiatric:        Mood and Affect: Mood normal.        Behavior: Behavior normal.      PLAN Problem List Items  Addressed This Visit     COPD (chronic obstructive pulmonary disease) (HCC)   Chronically managed with albuterol  and breztri . Is in need of a new nebulizer. Will work to order this for her. Wheezing present today, but no other concerning symptoms. No distress. Will continue to monitor.       Relevant Orders   For home use only DME Nebulizer machine   Hemiparesis affecting right side as late effect of cerebrovascular accident (CVA) (HCC)   Chronic. Bound to wheelchair for transit. Recommend close monitoring of skin.       CKD stage 3a, GFR 45-59 ml/min (HCC) - Baseline Scr 1.2-1.5   In the setting of recent kidney stones and MI, suspect kidneys have taken a hit. Will monitor labs today.        Relevant Medications   hydrALAZINE  (APRESOLINE ) 100 MG tablet   Hyperlipidemia associated with type 2 diabetes mellitus (HCC)   Managed with rosuvastatin . No alarms at this time. Continue treatment.       Relevant Medications   hydrALAZINE  (APRESOLINE ) 100 MG tablet   Other Relevant Orders   Hemoglobin A1c (Completed)   CBC with Differential/Platelet (Completed)   Comprehensive metabolic panel with GFR (Completed)   LP+LDL Direct (Completed)   VITAMIN D  25 Hydroxy (Vit-D Deficiency, Fractures) (Completed)   Iron, TIBC and Ferritin Panel (Completed)   Magnesium  (Completed)   AMB Referral VBCI Care Management   Hypertension associated with stage 3b chronic kidney disease due to type 2 diabetes mellitus (HCC)   Her blood pressure is well-controlled on amlodipine  and olmesartan. Hydralazine  was previously added but is not necessary at this time due to good blood pressure control. Hydralazine  can be used as needed for acute blood pressure spikes above 150/95 mmHg. - Continue amlodipine  and olmesartan - Use hydralazine  as needed for blood pressure spikes above 150/95 mmHg      Relevant Medications   hydrALAZINE  (APRESOLINE ) 100 MG tablet   Major depression in remission [facility])   Managed with citalopram  The emotional impact of the recent cardiac events and potential surgery is acknowledged. Post-operative emotional fluctuations are anticipated and should be monitored. - Ensure refill of depression medication is processed      Relevant Medications   hydrALAZINE  (APRESOLINE ) 100 MG tablet   Other Relevant Orders   Hemoglobin A1c (Completed)   CBC with Differential/Platelet (Completed)   Comprehensive metabolic panel with GFR (Completed)   LP+LDL Direct (Completed)   VITAMIN D  25 Hydroxy (Vit-D Deficiency, Fractures) (Completed)   Iron, TIBC and Ferritin Panel (Completed)   Magnesium  (Completed)   CAD (coronary artery disease), native coronary artery   She recently experienced a  myocardial infarction and was transported back to [place] . CABG is recommended due to the risk of another myocardial infarction. She expressed fear about the surgery, particularly the risk of not surviving, but understands the potential benefits outweigh the risks. The surgery is considered routine, with crucial post-operative care. She is considering the surgery and has an upcoming appointment with the cardiologist to discuss further. Post-operative recovery in a nursing facility is recommended for 4-6 weeks to ensure proper care and rehabilitation. - Discuss CABG with cardiologist at upcoming appointment - Consider post-operative care in a nursing facility for 4-6 weeks for recovery      Relevant Medications   hydrALAZINE  (APRESOLINE ) 100 MG tablet   RESOLVED: Heart block AV complete (HCC)   Relevant Medications   hydrALAZINE  (APRESOLINE ) 100 MG tablet   Other Relevant Orders   Hemoglobin A1c (  Completed)   CBC with Differential/Platelet (Completed)   Comprehensive metabolic panel with GFR (Completed)   LP+LDL Direct (Completed)   VITAMIN D  25 Hydroxy (Vit-D Deficiency, Fractures) (Completed)   Iron, TIBC and Ferritin Panel (Completed)   Magnesium  (Completed)   RESOLVED: Unstable angina (HCC)   Relevant Medications   hydrALAZINE  (APRESOLINE ) 100 MG tablet   Other Visit Diagnoses       Type 2 diabetes mellitus with diabetic neuropathy, with long-term current use of insulin  (HCC)    -  Primary   Relevant Medications   hydrALAZINE  (APRESOLINE ) 100 MG tablet   Other Relevant Orders   Hemoglobin A1c (Completed)   CBC with Differential/Platelet (Completed)   Comprehensive metabolic panel with GFR (Completed)   LP+LDL Direct (Completed)   VITAMIN D  25 Hydroxy (Vit-D Deficiency, Fractures) (Completed)   Iron, TIBC and Ferritin Panel (Completed)   Magnesium  (Completed)   AMB Referral VBCI Care Management       Return in about 4 months (around 05/11/2024) for Med Management  30.  [name], DNP, AGNP-c

## 2024-01-11 ENCOUNTER — Telehealth: Payer: Self-pay

## 2024-01-11 LAB — CBC WITH DIFFERENTIAL/PLATELET
Basophils Absolute: 0.1 10*3/uL (ref 0.0–0.2)
Basos: 1 %
EOS (ABSOLUTE): 0.2 10*3/uL (ref 0.0–0.4)
Eos: 2 %
Hematocrit: 36.6 % (ref 34.0–46.6)
Hemoglobin: 11.5 g/dL (ref 11.1–15.9)
Immature Grans (Abs): 0 10*3/uL (ref 0.0–0.1)
Immature Granulocytes: 0 %
Lymphocytes Absolute: 1.6 10*3/uL (ref 0.7–3.1)
Lymphs: 23 %
MCH: 27.6 pg (ref 26.6–33.0)
MCHC: 31.4 g/dL — ABNORMAL LOW (ref 31.5–35.7)
MCV: 88 fL (ref 79–97)
Monocytes Absolute: 0.7 10*3/uL (ref 0.1–0.9)
Monocytes: 11 %
Neutrophils Absolute: 4.3 10*3/uL (ref 1.4–7.0)
Neutrophils: 63 %
Platelets: 343 10*3/uL (ref 150–450)
RBC: 4.17 x10E6/uL (ref 3.77–5.28)
RDW: 15.8 % — ABNORMAL HIGH (ref 11.7–15.4)
WBC: 6.8 10*3/uL (ref 3.4–10.8)

## 2024-01-11 LAB — LP+LDL DIRECT
Cholesterol, Total: 159 mg/dL (ref 100–199)
HDL: 62 mg/dL (ref >39)
LDL Chol Calc (NIH): 71 mg/dL (ref 0–99)
LDL Direct: 70 mg/dL (ref 0–99)
Triglycerides: 157 mg/dL — ABNORMAL HIGH (ref 0–149)
VLDL Cholesterol Cal: 26 mg/dL (ref 5–40)

## 2024-01-11 LAB — COMPREHENSIVE METABOLIC PANEL WITH GFR
ALT: 20 IU/L (ref 0–32)
AST: 25 IU/L (ref 0–40)
Albumin: 3.8 g/dL — ABNORMAL LOW (ref 3.9–4.9)
Alkaline Phosphatase: 89 IU/L (ref 44–121)
BUN/Creatinine Ratio: 21 (ref 12–28)
BUN: 27 mg/dL (ref 8–27)
Bilirubin Total: 0.2 mg/dL (ref 0.0–1.2)
CO2: 23 mmol/L (ref 20–29)
Calcium: 9.6 mg/dL (ref 8.7–10.3)
Chloride: 105 mmol/L (ref 96–106)
Creatinine, Ser: 1.29 mg/dL — ABNORMAL HIGH (ref 0.57–1.00)
Globulin, Total: 3 g/dL (ref 1.5–4.5)
Glucose: 90 mg/dL (ref 70–99)
Potassium: 4.9 mmol/L (ref 3.5–5.2)
Sodium: 144 mmol/L (ref 134–144)
Total Protein: 6.8 g/dL (ref 6.0–8.5)
eGFR: 47 mL/min/{1.73_m2} — ABNORMAL LOW (ref >59)

## 2024-01-11 LAB — IRON,TIBC AND FERRITIN PANEL
Ferritin: 111 ng/mL (ref 15–150)
Iron Saturation: 12 % — ABNORMAL LOW (ref 15–55)
Iron: 37 ug/dL (ref 27–139)
Total Iron Binding Capacity: 302 ug/dL (ref 250–450)
UIBC: 265 ug/dL (ref 118–369)

## 2024-01-11 LAB — HEMOGLOBIN A1C
Est. average glucose Bld gHb Est-mCnc: 163 mg/dL
Hgb A1c MFr Bld: 7.3 % — ABNORMAL HIGH (ref 4.8–5.6)

## 2024-01-11 LAB — MAGNESIUM: Magnesium: 2.1 mg/dL (ref 1.6–2.3)

## 2024-01-11 LAB — VITAMIN D 25 HYDROXY (VIT D DEFICIENCY, FRACTURES): Vit D, 25-Hydroxy: 36.5 ng/mL (ref 30.0–100.0)

## 2024-01-11 NOTE — Progress Notes (Signed)
 Complex Care Management Note  Care Guide Note 01/11/2024 Name: Kelly Gibson MRN: [id number] DOB: [birth year]  Kelly Gibson is a 63 y.o. year old female who sees Early, Adriane Albe, NP for primary care. I reached out to Kelly Gibson by phone today to offer complex care management services.  Ms. Crunkleton was given information about Complex Care Management services today including:   The Complex Care Management services include support from the care team which includes your Nurse Care Manager, Clinical Social Worker, or Pharmacist.  The Complex Care Management team is here to help remove barriers to the health concerns and goals most important to you. Complex Care Management services are voluntary, and the patient may decline or stop services at any time by request to their care team member.   Complex Care Management Consent Status: Patient agreed to services and verbal consent obtained.   Follow up plan:  Telephone appointment with complex care management team member scheduled for:  01-27-24  Encounter Outcome:  Patient Scheduled  [facility], [facility] Guide  Direct Dial: [phone]  Fax [phone]

## 2024-01-12 ENCOUNTER — Inpatient Hospital Stay (HOSPITAL_COMMUNITY)

## 2024-01-12 ENCOUNTER — Encounter (HOSPITAL_COMMUNITY): Payer: Self-pay | Admitting: Emergency Medicine

## 2024-01-12 ENCOUNTER — Emergency Department (HOSPITAL_COMMUNITY)

## 2024-01-12 ENCOUNTER — Inpatient Hospital Stay (HOSPITAL_COMMUNITY)
Admission: EM | Admit: 2024-01-12 | Discharge: 2024-01-14 | DRG: 291 | Disposition: A | Discharge status: Home / Self Care | Attending: Internal Medicine | Admitting: Internal Medicine

## 2024-01-12 ENCOUNTER — Other Ambulatory Visit: Payer: Self-pay

## 2024-01-12 DIAGNOSIS — Z1152 Encounter for screening for COVID-19: Secondary | ICD-10-CM | POA: Diagnosis not present

## 2024-01-12 DIAGNOSIS — R0602 Shortness of breath: Secondary | ICD-10-CM | POA: Diagnosis not present

## 2024-01-12 DIAGNOSIS — J9811 Atelectasis: Secondary | ICD-10-CM | POA: Diagnosis not present

## 2024-01-12 DIAGNOSIS — Z823 Family history of stroke: Secondary | ICD-10-CM

## 2024-01-12 DIAGNOSIS — Z794 Long term (current) use of insulin: Secondary | ICD-10-CM | POA: Diagnosis not present

## 2024-01-12 DIAGNOSIS — I5033 Acute on chronic diastolic (congestive) heart failure: Secondary | ICD-10-CM | POA: Diagnosis present

## 2024-01-12 DIAGNOSIS — E1165 Type 2 diabetes mellitus with hyperglycemia: Secondary | ICD-10-CM | POA: Diagnosis not present

## 2024-01-12 DIAGNOSIS — I130 Hypertensive heart and chronic kidney disease with heart failure and stage 1 through stage 4 chronic kidney disease, or unspecified chronic kidney disease: Secondary | ICD-10-CM | POA: Diagnosis not present

## 2024-01-12 DIAGNOSIS — C3490 Malignant neoplasm of unspecified part of unspecified bronchus or lung: Secondary | ICD-10-CM

## 2024-01-12 DIAGNOSIS — E1169 Type 2 diabetes mellitus with other specified complication: Secondary | ICD-10-CM | POA: Diagnosis present

## 2024-01-12 DIAGNOSIS — I10 Essential (primary) hypertension: Secondary | ICD-10-CM | POA: Diagnosis present

## 2024-01-12 DIAGNOSIS — J449 Chronic obstructive pulmonary disease, unspecified: Secondary | ICD-10-CM | POA: Diagnosis not present

## 2024-01-12 DIAGNOSIS — N1831 Chronic kidney disease, stage 3a: Secondary | ICD-10-CM | POA: Diagnosis not present

## 2024-01-12 DIAGNOSIS — E785 Hyperlipidemia, unspecified: Secondary | ICD-10-CM | POA: Diagnosis not present

## 2024-01-12 DIAGNOSIS — Z7985 Long-term (current) use of injectable non-insulin antidiabetic drugs: Secondary | ICD-10-CM

## 2024-01-12 DIAGNOSIS — E1122 Type 2 diabetes mellitus with diabetic chronic kidney disease: Secondary | ICD-10-CM | POA: Diagnosis not present

## 2024-01-12 DIAGNOSIS — C3432 Malignant neoplasm of lower lobe, left bronchus or lung: Secondary | ICD-10-CM | POA: Diagnosis not present

## 2024-01-12 DIAGNOSIS — Z743 Need for continuous supervision: Secondary | ICD-10-CM | POA: Diagnosis not present

## 2024-01-12 DIAGNOSIS — Z833 Family history of diabetes mellitus: Secondary | ICD-10-CM

## 2024-01-12 DIAGNOSIS — F32A Depression, unspecified: Secondary | ICD-10-CM | POA: Diagnosis present

## 2024-01-12 DIAGNOSIS — Z6834 Body mass index (BMI) 34.0-34.9, adult: Secondary | ICD-10-CM

## 2024-01-12 DIAGNOSIS — J189 Pneumonia, unspecified organism: Secondary | ICD-10-CM

## 2024-01-12 DIAGNOSIS — R0689 Other abnormalities of breathing: Secondary | ICD-10-CM | POA: Diagnosis not present

## 2024-01-12 DIAGNOSIS — Z7982 Long term (current) use of aspirin: Secondary | ICD-10-CM

## 2024-01-12 DIAGNOSIS — I129 Hypertensive chronic kidney disease with stage 1 through stage 4 chronic kidney disease, or unspecified chronic kidney disease: Secondary | ICD-10-CM | POA: Diagnosis present

## 2024-01-12 DIAGNOSIS — I2699 Other pulmonary embolism without acute cor pulmonale: Secondary | ICD-10-CM

## 2024-01-12 DIAGNOSIS — Z951 Presence of aortocoronary bypass graft: Secondary | ICD-10-CM | POA: Diagnosis not present

## 2024-01-12 DIAGNOSIS — F17210 Nicotine dependence, cigarettes, uncomplicated: Secondary | ICD-10-CM | POA: Diagnosis not present

## 2024-01-12 DIAGNOSIS — J90 Pleural effusion, not elsewhere classified: Secondary | ICD-10-CM | POA: Diagnosis not present

## 2024-01-12 DIAGNOSIS — Z79899 Other long term (current) drug therapy: Secondary | ICD-10-CM

## 2024-01-12 DIAGNOSIS — R062 Wheezing: Secondary | ICD-10-CM | POA: Diagnosis not present

## 2024-01-12 DIAGNOSIS — Z7984 Long term (current) use of oral hypoglycemic drugs: Secondary | ICD-10-CM

## 2024-01-12 DIAGNOSIS — K8020 Calculus of gallbladder without cholecystitis without obstruction: Secondary | ICD-10-CM | POA: Diagnosis not present

## 2024-01-12 DIAGNOSIS — I509 Heart failure, unspecified: Secondary | ICD-10-CM | POA: Diagnosis not present

## 2024-01-12 DIAGNOSIS — J9601 Acute respiratory failure with hypoxia: Secondary | ICD-10-CM | POA: Diagnosis not present

## 2024-01-12 DIAGNOSIS — Z91040 Latex allergy status: Secondary | ICD-10-CM

## 2024-01-12 DIAGNOSIS — R0603 Acute respiratory distress: Secondary | ICD-10-CM | POA: Diagnosis not present

## 2024-01-12 DIAGNOSIS — R0989 Other specified symptoms and signs involving the circulatory and respiratory systems: Secondary | ICD-10-CM | POA: Diagnosis not present

## 2024-01-12 DIAGNOSIS — Z888 Allergy status to other drugs, medicaments and biological substances status: Secondary | ICD-10-CM

## 2024-01-12 DIAGNOSIS — I4430 Unspecified atrioventricular block: Secondary | ICD-10-CM | POA: Diagnosis present

## 2024-01-12 DIAGNOSIS — Z936 Other artificial openings of urinary tract status: Secondary | ICD-10-CM | POA: Diagnosis not present

## 2024-01-12 DIAGNOSIS — E66811 Obesity, class 1: Secondary | ICD-10-CM | POA: Diagnosis present

## 2024-01-12 DIAGNOSIS — I69351 Hemiplegia and hemiparesis following cerebral infarction affecting right dominant side: Secondary | ICD-10-CM

## 2024-01-12 DIAGNOSIS — N200 Calculus of kidney: Secondary | ICD-10-CM | POA: Diagnosis present

## 2024-01-12 DIAGNOSIS — I252 Old myocardial infarction: Secondary | ICD-10-CM

## 2024-01-12 DIAGNOSIS — E1129 Type 2 diabetes mellitus with other diabetic kidney complication: Secondary | ICD-10-CM

## 2024-01-12 DIAGNOSIS — I499 Cardiac arrhythmia, unspecified: Secondary | ICD-10-CM | POA: Diagnosis not present

## 2024-01-12 DIAGNOSIS — I700 Atherosclerosis of aorta: Secondary | ICD-10-CM | POA: Diagnosis not present

## 2024-01-12 DIAGNOSIS — Z8249 Family history of ischemic heart disease and other diseases of the circulatory system: Secondary | ICD-10-CM

## 2024-01-12 DIAGNOSIS — F419 Anxiety disorder, unspecified: Secondary | ICD-10-CM | POA: Diagnosis present

## 2024-01-12 DIAGNOSIS — I69322 Dysarthria following cerebral infarction: Secondary | ICD-10-CM

## 2024-01-12 DIAGNOSIS — R001 Bradycardia, unspecified: Secondary | ICD-10-CM | POA: Diagnosis present

## 2024-01-12 DIAGNOSIS — I2510 Atherosclerotic heart disease of native coronary artery without angina pectoris: Secondary | ICD-10-CM | POA: Diagnosis present

## 2024-01-12 DIAGNOSIS — J441 Chronic obstructive pulmonary disease with (acute) exacerbation: Secondary | ICD-10-CM | POA: Diagnosis present

## 2024-01-12 DIAGNOSIS — I110 Hypertensive heart disease with heart failure: Secondary | ICD-10-CM | POA: Diagnosis not present

## 2024-01-12 DIAGNOSIS — Z7901 Long term (current) use of anticoagulants: Secondary | ICD-10-CM | POA: Diagnosis not present

## 2024-01-12 DIAGNOSIS — R918 Other nonspecific abnormal finding of lung field: Secondary | ICD-10-CM | POA: Diagnosis not present

## 2024-01-12 LAB — CBC WITH DIFFERENTIAL/PLATELET
Abs Immature Granulocytes: 0.07 10*3/uL (ref 0.00–0.07)
Basophils Absolute: 0.1 10*3/uL (ref 0.0–0.1)
Basophils Relative: 1 %
Eosinophils Absolute: 0.1 10*3/uL (ref 0.0–0.5)
Eosinophils Relative: 1 %
HCT: 34.9 % — ABNORMAL LOW (ref 36.0–46.0)
Hemoglobin: 10.1 g/dL — ABNORMAL LOW (ref 12.0–15.0)
Immature Granulocytes: 1 %
Lymphocytes Relative: 16 %
Lymphs Abs: 1.8 10*3/uL (ref 0.7–4.0)
MCH: 27 pg (ref 26.0–34.0)
MCHC: 28.9 g/dL — ABNORMAL LOW (ref 30.0–36.0)
MCV: 93.3 fL (ref 80.0–100.0)
Monocytes Absolute: 0.8 10*3/uL (ref 0.1–1.0)
Monocytes Relative: 7 %
Neutro Abs: 8.8 10*3/uL — ABNORMAL HIGH (ref 1.7–7.7)
Neutrophils Relative %: 74 %
Platelets: 314 10*3/uL (ref 150–400)
RBC: 3.74 MIL/uL — ABNORMAL LOW (ref 3.87–5.11)
RDW: 16.3 % — ABNORMAL HIGH (ref 11.5–15.5)
WBC: 11.8 10*3/uL — ABNORMAL HIGH (ref 4.0–10.5)
nRBC: 0 % (ref 0.0–0.2)

## 2024-01-12 LAB — BASIC METABOLIC PANEL WITH GFR
Anion gap: 14 (ref 5–15)
BUN: 21 mg/dL (ref 8–23)
CO2: 20 mmol/L — ABNORMAL LOW (ref 22–32)
Calcium: 8.7 mg/dL — ABNORMAL LOW (ref 8.9–10.3)
Chloride: 106 mmol/L (ref 98–111)
Creatinine, Ser: 1.3 mg/dL — ABNORMAL HIGH (ref 0.44–1.00)
GFR, Estimated: 47 mL/min — ABNORMAL LOW (ref >60)
Glucose, Bld: 171 mg/dL — ABNORMAL HIGH (ref 70–99)
Potassium: 4 mmol/L (ref 3.5–5.1)
Sodium: 140 mmol/L (ref 135–145)

## 2024-01-12 LAB — CREATININE, SERUM
Creatinine, Ser: 1.21 mg/dL — ABNORMAL HIGH (ref 0.44–1.00)
GFR, Estimated: 51 mL/min — ABNORMAL LOW (ref >60)

## 2024-01-12 LAB — TROPONIN I (HIGH SENSITIVITY)
Troponin I (High Sensitivity): 40 ng/L — ABNORMAL HIGH (ref <18)
Troponin I (High Sensitivity): 199 ng/L (ref <18)

## 2024-01-12 LAB — I-STAT VENOUS BLOOD GAS, ED
Acid-base deficit: 2 mmol/L (ref 0.0–2.0)
Bicarbonate: 22.8 mmol/L (ref 20.0–28.0)
Calcium, Ion: 1.11 mmol/L — ABNORMAL LOW (ref 1.15–1.40)
HCT: 31 % — ABNORMAL LOW (ref 36.0–46.0)
Hemoglobin: 10.5 g/dL — ABNORMAL LOW (ref 12.0–15.0)
O2 Saturation: 95 %
Potassium: 3.9 mmol/L (ref 3.5–5.1)
Sodium: 142 mmol/L (ref 135–145)
TCO2: 24 mmol/L (ref 22–32)
pCO2, Ven: 39.3 mmHg — ABNORMAL LOW (ref 44–60)
pH, Ven: 7.372 (ref 7.25–7.43)
pO2, Ven: 79 mmHg — ABNORMAL HIGH (ref 32–45)

## 2024-01-12 LAB — CBC
HCT: 32.6 % — ABNORMAL LOW (ref 36.0–46.0)
Hemoglobin: 9.8 g/dL — ABNORMAL LOW (ref 12.0–15.0)
MCH: 27.5 pg (ref 26.0–34.0)
MCHC: 30.1 g/dL (ref 30.0–36.0)
MCV: 91.6 fL (ref 80.0–100.0)
Platelets: 286 10*3/uL (ref 150–400)
RBC: 3.56 MIL/uL — ABNORMAL LOW (ref 3.87–5.11)
RDW: 16.2 % — ABNORMAL HIGH (ref 11.5–15.5)
WBC: 9.8 10*3/uL (ref 4.0–10.5)
nRBC: 0 % (ref 0.0–0.2)

## 2024-01-12 LAB — RESP PANEL BY RT-PCR (RSV, FLU A&B, COVID)  RVPGX2
Influenza A by PCR: NEGATIVE
Influenza B by PCR: NEGATIVE
Resp Syncytial Virus by PCR: NEGATIVE
SARS Coronavirus 2 by RT PCR: NEGATIVE

## 2024-01-12 LAB — BRAIN NATRIURETIC PEPTIDE: B Natriuretic Peptide: 667.3 pg/mL — ABNORMAL HIGH (ref 0.0–100.0)

## 2024-01-12 LAB — GLUCOSE, CAPILLARY
Glucose-Capillary: 415 mg/dL — ABNORMAL HIGH (ref 70–99)
Glucose-Capillary: 259 mg/dL — ABNORMAL HIGH (ref 70–99)

## 2024-01-12 LAB — MRSA NEXT GEN BY PCR, NASAL: MRSA by PCR Next Gen: NOT DETECTED

## 2024-01-12 LAB — HEPARIN LEVEL (UNFRACTIONATED): Heparin Unfractionated: 0.95 [IU]/mL — ABNORMAL HIGH (ref 0.30–0.70)

## 2024-01-12 MED ORDER — IOHEXOL 350 MG/ML SOLN
60.0000 mL | Freq: Once | INTRAVENOUS | Status: AC | PRN
  Administered 2024-01-12: 60 mL via INTRAVENOUS

## 2024-01-12 MED ORDER — ENOXAPARIN SODIUM 40 MG/0.4ML IJ SOSY
40.0000 mg | PREFILLED_SYRINGE | INTRAMUSCULAR | Status: DC
  Administered 2024-01-12: 40 mg via SUBCUTANEOUS
  Filled 2024-01-12: qty 0.4

## 2024-01-12 MED ORDER — INSULIN ASPART 100 UNIT/ML IJ SOLN
0.0000 [IU] | Freq: Three times a day (TID) | INTRAMUSCULAR | Status: DC
  Administered 2024-01-12 – 2024-01-13 (×2): 3 [IU] via SUBCUTANEOUS
  Administered 2024-01-13: 11 [IU] via SUBCUTANEOUS
  Administered 2024-01-13: 3 [IU] via SUBCUTANEOUS

## 2024-01-12 MED ORDER — HEPARIN (PORCINE) 25000 UT/250ML-% IV SOLN
1150.0000 [IU]/h | INTRAVENOUS | Status: DC
  Administered 2024-01-12: 1250 [IU]/h via INTRAVENOUS
  Administered 2024-01-13: 1150 [IU]/h via INTRAVENOUS
  Filled 2024-01-12 (×2): qty 250

## 2024-01-12 MED ORDER — FUROSEMIDE 10 MG/ML IJ SOLN
40.0000 mg | Freq: Two times a day (BID) | INTRAMUSCULAR | Status: AC
  Administered 2024-01-12 – 2024-01-13 (×2): 40 mg via INTRAVENOUS
  Filled 2024-01-12 (×2): qty 4

## 2024-01-12 MED ORDER — MELATONIN 5 MG PO TABS
10.0000 mg | ORAL_TABLET | Freq: Every day | ORAL | Status: AC
  Administered 2024-01-12: 10 mg via ORAL
  Filled 2024-01-12: qty 2

## 2024-01-12 MED ORDER — SODIUM CHLORIDE 0.9 % IV SOLN
1.0000 g | Freq: Once | INTRAVENOUS | Status: AC
  Administered 2024-01-12: 1 g via INTRAVENOUS
  Filled 2024-01-12: qty 10

## 2024-01-12 MED ORDER — ASPIRIN 81 MG PO TBEC
81.0000 mg | DELAYED_RELEASE_TABLET | Freq: Every day | ORAL | Status: DC
  Administered 2024-01-12 – 2024-01-14 (×3): 81 mg via ORAL
  Filled 2024-01-12 (×3): qty 1

## 2024-01-12 MED ORDER — CITALOPRAM HYDROBROMIDE 20 MG PO TABS
10.0000 mg | ORAL_TABLET | Freq: Every day | ORAL | Status: DC
  Administered 2024-01-12 – 2024-01-14 (×3): 10 mg via ORAL
  Filled 2024-01-12 (×3): qty 1

## 2024-01-12 MED ORDER — IPRATROPIUM-ALBUTEROL 0.5-2.5 (3) MG/3ML IN SOLN: 3.0000 mL | Freq: Four times a day (QID) | RESPIRATORY_TRACT | Status: DC | PRN

## 2024-01-12 MED ORDER — HEPARIN BOLUS VIA INFUSION
2500.0000 [IU] | Freq: Once | INTRAVENOUS | Status: AC
  Administered 2024-01-12: 2500 [IU] via INTRAVENOUS
  Filled 2024-01-12: qty 2500

## 2024-01-12 MED ORDER — DOXYLAMINE SUCCINATE (SLEEP) 25 MG PO TABS
25.0000 mg | ORAL_TABLET | Freq: Every day | ORAL | Status: AC
  Administered 2024-01-12: 25 mg via ORAL
  Filled 2024-01-12: qty 1

## 2024-01-12 MED ORDER — INSULIN ASPART 100 UNIT/ML IJ SOLN
0.0000 [IU] | Freq: Every day | INTRAMUSCULAR | Status: DC
  Administered 2024-01-12: 5 [IU] via SUBCUTANEOUS

## 2024-01-12 MED ORDER — TIZANIDINE HCL 4 MG PO TABS
2.0000 mg | ORAL_TABLET | Freq: Every day | ORAL | Status: AC
  Administered 2024-01-12: 2 mg via ORAL
  Filled 2024-01-12: qty 1

## 2024-01-12 MED ORDER — BUDESON-GLYCOPYRROL-FORMOTEROL 160-9-4.8 MCG/ACT IN AERO
2.0000 | INHALATION_SPRAY | Freq: Two times a day (BID) | RESPIRATORY_TRACT | Status: DC
  Administered 2024-01-12 – 2024-01-14 (×4): 2 via RESPIRATORY_TRACT
  Filled 2024-01-12: qty 5.9

## 2024-01-12 MED ORDER — IRBESARTAN 150 MG PO TABS
150.0000 mg | ORAL_TABLET | Freq: Every day | ORAL | Status: DC
  Administered 2024-01-12 – 2024-01-14 (×3): 150 mg via ORAL
  Filled 2024-01-12 (×3): qty 1

## 2024-01-12 MED ORDER — FUROSEMIDE 10 MG/ML IJ SOLN
40.0000 mg | Freq: Once | INTRAMUSCULAR | Status: AC
  Administered 2024-01-12: 40 mg via INTRAVENOUS
  Filled 2024-01-12: qty 4

## 2024-01-12 MED ORDER — SODIUM CHLORIDE 0.9 % IV SOLN
500.0000 mg | Freq: Once | INTRAVENOUS | Status: AC
  Administered 2024-01-12: 500 mg via INTRAVENOUS
  Filled 2024-01-12: qty 5

## 2024-01-12 MED ORDER — ROSUVASTATIN CALCIUM 20 MG PO TABS
40.0000 mg | ORAL_TABLET | Freq: Every day | ORAL | Status: DC
  Administered 2024-01-12 – 2024-01-13 (×3): 40 mg via ORAL
  Filled 2024-01-12 (×3): qty 2

## 2024-01-12 NOTE — Progress Notes (Signed)
 CBG 419, no hs coverage.  triad hospitalist paged.

## 2024-01-12 NOTE — Plan of Care (Signed)
  Problem: Education: Goal: Ability to describe self-care measures that may prevent or decrease complications (Diabetes Survival Skills Education) will improve 01/12/2024 2058 by Eilene Grater, RN Outcome: Progressing 01/12/2024 2058 by Eilene Grater, RN Outcome: Not Progressing Goal: Individualized Educational Video(s) 01/12/2024 2058 by Eilene Grater, RN Outcome: Progressing 01/12/2024 2058 by Eilene Grater, RN Outcome: Not Progressing   Problem: Coping: Goal: Ability to adjust to condition or change in health will improve 01/12/2024 2058 by Shawnique Mariotti, Natha Bair, RN Outcome: Progressing 01/12/2024 2058 by Eilene Grater, RN Outcome: Not Progressing   Problem: Fluid Volume: Goal: Ability to maintain a balanced intake and output will improve 01/12/2024 2058 by Tanelle Lanzo, Natha Bair, RN Outcome: Progressing 01/12/2024 2058 by Eilene Grater, RN Outcome: Not Progressing   Problem: Health Behavior/Discharge Planning: Goal: Ability to identify and utilize available resources and services will improve 01/12/2024 2058 by Sukhman Martine, Natha Bair, RN Outcome: Progressing 01/12/2024 2058 by Eilene Grater, RN Outcome: Not Progressing Goal: Ability to manage health-related needs will improve 01/12/2024 2058 by Jurgen Groeneveld, Natha Bair, RN Outcome: Progressing 01/12/2024 2058 by Eilene Grater, RN Outcome: Not Progressing   Problem: Metabolic: Goal: Ability to maintain appropriate glucose levels will improve 01/12/2024 2058 by Eilene Grater, RN Outcome: Progressing 01/12/2024 2058 by Eilene Grater, RN Outcome: Not Progressing   Problem: Nutritional: Goal: Maintenance of adequate nutrition will improve 01/12/2024 2058 by Eilene Grater, RN Outcome: Progressing 01/12/2024 2058 by Eilene Grater, RN Outcome: Not Progressing Goal: Progress toward achieving an optimal weight will improve 01/12/2024 2058 by Eilene Grater, RN Outcome:  Progressing 01/12/2024 2058 by Eilene Grater, RN Outcome: Not Progressing   Problem: Skin Integrity: Goal: Risk for impaired skin integrity will decrease 01/12/2024 2058 by Eilene Grater, RN Outcome: Progressing 01/12/2024 2058 by Eilene Grater, RN Outcome: Not Progressing   Problem: Tissue Perfusion: Goal: Adequacy of tissue perfusion will improve 01/12/2024 2058 by Aziel Morgan, Natha Bair, RN Outcome: Progressing 01/12/2024 2058 by Eilene Grater, RN Outcome: Not Progressing   Problem: Education: Goal: Knowledge of General Education information will improve Description: Including pain rating scale, medication(s)/side effects and non-pharmacologic comfort measures Outcome: Not Progressing   Problem: Health Behavior/Discharge Planning: Goal: Ability to manage health-related needs will improve Outcome: Not Progressing   Problem: Clinical Measurements: Goal: Ability to maintain clinical measurements within normal limits will improve Outcome: Not Progressing Goal: Will remain free from infection Outcome: Not Progressing Goal: Diagnostic test results will improve Outcome: Not Progressing Goal: Respiratory complications will improve Outcome: Not Progressing Goal: Cardiovascular complication will be avoided Outcome: Not Progressing   Problem: Activity: Goal: Risk for activity intolerance will decrease Outcome: Not Progressing   Problem: Nutrition: Goal: Adequate nutrition will be maintained Outcome: Not Progressing   Problem: Coping: Goal: Level of anxiety will decrease Outcome: Not Progressing   Problem: Elimination: Goal: Will not experience complications related to bowel motility Outcome: Not Progressing Goal: Will not experience complications related to urinary retention Outcome: Not Progressing   Problem: Pain Managment: Goal: General experience of comfort will improve and/or be controlled Outcome: Not Progressing

## 2024-01-12 NOTE — Progress Notes (Signed)
   01/12/24 2144  BiPAP/CPAP/SIPAP  Reason BIPAP/CPAP not in use Non-compliant  BiPAP/CPAP /SiPAP Vitals  Pulse Rate (!) 55  Resp 20  SpO2 99 %  Bilateral Breath Sounds Diminished  MEWS Score/Color  MEWS Score 0  MEWS Score Color Kelly Gibson

## 2024-01-12 NOTE — H&P (Addendum)
 History and Physical    Patient: Kelly Gibson:[id number] DOB: 10/09/1960 DOA: 01/12/2024 DOS: the patient was seen and examined on 01/12/2024 PCP: Annella Kief, NP  Patient coming from: Home  Chief Complaint:  Chief Complaint  Patient presents with   Respiratory Distress   Bipap   HPI: Kelly Gibson is a 63 y.o. female with medical history significant of CAD (3 vessel disease but CABG deferred per pt), AV block, COPD, HTN, HLD, DM2, CVA with residual hemiparesis and dysphagia, CKD3 c/b L renal staghorn calculi s/p L nephrostomy tube p/w recurrent SOB iso ADHF.  Pt states that she was in her USOH until this morning when she awoke at 0200 acutely SOB at rest for which she took a puff of her inhaler and her breathing stabilized enough for her to go back to sleep. Per her husband at bedside, she woke up again with SOB at rest around 0600 and tried her inhaler again to no avail; as such, they activated EMS and BIBA to [facility] ED. Pt cooks 5/7 meals per week and does NOT follow a salt-free diet.   Of note, pt was admitted a week ago on 6/10 and found to have E. Coli sepsis iso L renal staghorn calculi s/p L nephrostomy tube placement for which she received IV abx, and 3 vessel CAD (3 total occlusions with collaterals) for which CABG deferred given her ongoing infection at that time. Most importantly pt was seen to have LLL infiltrate and bronch biopsies from 6/9 demonstrate NSCLC.  In the ED, pt tachypneic on 4L [place]. Labs notable for Cr 1.3, BNP 667, and WBC 11.8. Pt admitted to medicine for ongoing care.   Review of Systems: As mentioned in the history of present illness. All other systems reviewed and are negative. Past Medical History:  Diagnosis Date   Bradycardia    SR/SB with up to 3.5 second pause (asymptomatic) on ~ 10/2017 event monitor; saw EP Dr. Nunzio Belch: avoid AV nodal blocking agents   Chronic kidney disease    COPD (chronic obstructive pulmonary disease) (HCC)    Depression     Depression with anxiety 08/29/2021   Diabetes mellitus    Dysarthria, post-stroke    Dysrhythmia    Ganglion cyst 03/09/2012   History of kidney stones    Hyperlipidemia    Hypertension    Hypokalemia    Myocardial infarction [facility])    NSTEMI   Obesity, unspecified 12/17/2011   Pneumonia    Pneumonia of left lower lobe due to infectious organism 12/12/2023   PONV (postoperative nausea and vomiting)    Skin lesion of face 11/17/2022   Stroke [facility])    2019   right side hemiparesis   Past Surgical History:  Procedure Laterality Date   CESAREAN SECTION     x 3   LEFT HEART CATH AND CORONARY ANGIOGRAPHY N/A 12/28/2023   Procedure: LEFT HEART CATH AND CORONARY ANGIOGRAPHY;  Surgeon: Cody Das, MD;  Location: [facility] INVASIVE CV LAB;  Service: Cardiovascular;  Laterality: N/A;   ORIF WRIST FRACTURE Right 05/04/2018   Procedure: OPEN REDUCTION INTERNAL FIXATION (ORIF) RIGHT  WRIST FRACTURE;  Surgeon: Micheline Ahr, MD;  Location: [facility] OR;  Service: Orthopedics;  Laterality: Right;   TUMOR REMOVAL     left shoulder   VIDEO BRONCHOSCOPY WITH ENDOBRONCHIAL NAVIGATION  01/03/2024   Procedure: VIDEO BRONCHOSCOPY WITH ENDOBRONCHIAL NAVIGATION;  Surgeon: Denson Flake, MD;  Location: [facility] ENDOSCOPY;  Service: Pulmonary;;   VIDEO BRONCHOSCOPY WITH  ENDOBRONCHIAL ULTRASOUND Bilateral 01/03/2024   Procedure: BRONCHOSCOPY, WITH EBUS;  Surgeon: Denson Flake, MD;  Location: [facility] ENDOSCOPY;  Service: Pulmonary;  Laterality: Bilateral;   Social History:  reports that she has quit smoking. Her smoking use included cigarettes. She has a 20 pack-year smoking history. She has never used smokeless tobacco. She reports that she does not drink alcohol and does not use drugs.  Allergies  Allergen Reactions   Latex Itching and Rash   Niacin Nausea And Vomiting    Family History  Problem Relation Age of Onset   Diabetes Mother    Stroke Mother    Heart disease Father    Cancer Father    Stroke Father      Prior to Admission medications   Medication Sig Start Date End Date Taking? Authorizing Provider  albuterol  (PROVENTIL ) (2.5 MG/3ML) 0.083% nebulizer solution Inhale 3 mLs (2.5 mg total) into the lungs every 6 (six) hours as needed for wheezing or shortness of breath. 01/04/24  Yes Aisha Hove, MD  albuterol  (VENTOLIN  HFA) 108 (90 Base) MCG/ACT inhaler Inhale 2 puffs into the lungs every 6 (six) hours as needed for wheezing or shortness of breath. 12/14/23  Yes Etter Hermann., MD  aspirin  EC 81 MG tablet Take 1 tablet (81 mg total) by mouth daily. Swallow whole. 12/15/23  Yes Etter Hermann., MD  budesonide -glycopyrrolate -formoterol  (BREZTRI  AEROSPHERE) 160-9-4.8 MCG/ACT AERO inhaler Inhale 2 puffs into the lungs 2 (two) times daily. 12/14/23  Yes Etter Hermann., MD  citalopram  (CELEXA ) 10 MG tablet TAKE 1 TABLET BY MOUTH DAILY 01/06/24  Yes Early, Sara E, NP  Doxylamine Succinate, Sleep, (UNISOM PO) Take 2 each by mouth at bedtime. Gel capsules   Yes [provider]  furosemide  (LASIX ) 20 MG tablet Take 1 tablet (20 mg total) by mouth daily as needed. 10/05/22  Yes Early, Sara E, NP  HUMALOG  KWIKPEN 100 UNIT/ML KwikPen INJECT 3 TO 5 UNITS INTO THE SKIN DAILY BEFORE LUNCH 11/29/23  Yes Early, Sara E, NP  hydrALAZINE  (APRESOLINE ) 100 MG tablet 1 tablet up to every 12 hours only as needed for blood pressure >150/90 01/10/24  Yes Early, Adriane Albe, NP  insulin  degludec (TRESIBA  FLEXTOUCH) 100 UNIT/ML FlexTouch Pen Inject 10 Units into the skin 2 (two) times daily. Adjust by 2 units every 3 days as needed for blood sugars higher than 150 in the morning fasting. 10/05/22  Yes Early, Sara E, NP  Melatonin 5 MG CHEW Chew 10 mg by mouth at bedtime.   Yes [provider]  Multiple Vitamins-Minerals (MULTIVITAMIN GUMMIES WOMENS) CHEW Chew 2 each by mouth in the morning.   Yes [provider]  olmesartan (BENICAR) 40 MG tablet Take 40 mg by mouth in the morning.  05/12/23  Yes [provider]  rosuvastatin  (CRESTOR ) 40 MG tablet TAKE 1 TABLET BY MOUTH DAILY 04/08/23  Yes Early, Sara E, NP  UNABLE TO FIND Take 1 tablet by mouth with breakfast, with lunch, and with evening meal. GlucoGold -Berberine, Concentrated Cinnamon, Chromium, Banaba Leaf Extract   Yes [provider]  Continuous Glucose Sensor (DEXCOM G7 SENSOR) MISC Take 1 Device by mouth See admin instructions. Change sensor every 10 days    [provider]  TRADJENTA  5 MG TABS tablet Take 5 mg by mouth daily. Patient not taking: Reported on 01/12/2024    [provider]    Physical Exam: Vitals:   01/12/24 1100 01/12/24 1115 01/12/24 1130 01/12/24 1145  BP: 137/61 (!) 141/65 (!) 147/67 127/76  Pulse: (!) 57 (!) 57 (!) 52 (!) 53  Resp: (!) 27 (!) 34 20 (!) 29  Temp:    98.2 F (36.8 C)  TempSrc:    Oral  SpO2: 100% 100% 100% 100%   General: Alert, oriented x3, resting comfortably in no acute distress Respiratory: Lungs clear to auscultation bilaterally with normal respiratory effort; no w/r/r Cardiovascular: Regular rate and rhythm w/o m/r/g   Data Reviewed:  Lab Results  Component Value Date   WBC 11.8 (H) 01/12/2024   HGB 10.5 (L) 01/12/2024   HCT 31.0 (L) 01/12/2024   MCV 93.3 01/12/2024   PLT 314 01/12/2024   Lab Results  Component Value Date   GLUCOSE 171 (H) 01/12/2024   CALCIUM  8.7 (L) 01/12/2024   NA 142 01/12/2024   K 3.9 01/12/2024   CO2 20 (L) 01/12/2024   CL 106 01/12/2024   BUN 21 01/12/2024   CREATININE 1.30 (H) 01/12/2024   Lab Results  Component Value Date   ALT 20 01/10/2024   AST 25 01/10/2024   ALKPHOS 89 01/10/2024   BILITOT 0.2 01/10/2024   Lab Results  Component Value Date   INR 1.0 12/13/2023   INR 1.04 12/05/2017   INR 1.11 10/26/2017    Radiology: [facility] Chest Port 1 View Result Date: 01/12/2024 CLINICAL DATA:  Shortness of breath. EXAM: PORTABLE CHEST 1 VIEW COMPARISON:  01/03/2024 FINDINGS: Low lung  volumes. The cardio pericardial silhouette is enlarged. Vascular congestion diffuse interstitial opacities suggesting edema. Retrocardiac left base atelectasis/infiltrate noted. No acute bony abnormality. Telemetry leads overlie the chest. IMPRESSION: 1. Low volume film with vascular congestion and diffuse interstitial opacities suggesting edema. 2. Retrocardiac left base atelectasis/infiltrate. Electronically Signed   By: Donnal Fusi M.D.   On: 01/12/2024 09:32    Assessment and Plan: 74F h/o CAD (3 vessel disease but CABG deferred per pt), AV block, COPD, HTN, HLD, DM2, CVA with residual hemiparesis and dysphagia, CKD3 c/b L renal staghorn calculi s/p L nephrostomy tube p/w recurrent SOB iso acute PE, ADHF and new diagnosis of LLL NSCLC.  AHRF HFpEF exacerbartion High suspicion for ADHF >AECOPD > CAP -Defer further abx for now -IV lasix  40mg  BID for now; goal net neg 1-2L/d; strict I/Os; daily standing weights; K>4/Mg>2 -F/u CT chest w/ contrast -Wean O2 as tolerated -Ambulatory pulse ox prior to d/c  Acute PE CTA PE protocol showed left lower lobe segmental pulmonary emboli. -Heparin  gtt  LLL NSCLC BAL from LLL infiltrate on 6/9 shows malignant cells c/w NSCLC -Pulm consulted; apprec eval/recs -Oncology consulted; apprec eval/recs -F/u CT chest w/ contrast  COPD Low suspicion for acute flare -PTA Breztri  and duonebs -Wean O2 as tolerated -Ambulatory pulse ox prior to d/c  H/o 3 vessel occlusion CAD -CT surgery consulted; apprec eval/recs  AV block -Consider EP eval for PPM pending above w/u  CKD3 c/b L renal staghorn calculi s/p L nephrostomy tube -F/u blood cultures and treat prn; f/u TTE to excude vegetations if Bcx positive   Advance Care Planning:   Code Status: Full Code   Consults: Oncology and CT surgery; may need EP  Family Communication: Husband, Gwen Lek, at bedside; number in Epic  Severity of Illness: The appropriate patient status for this patient is  INPATIENT. Inpatient status is judged to be reasonable and necessary in order to provide the required intensity of service to ensure the patient's safety. The patient's presenting symptoms, physical exam findings, and initial radiographic and laboratory  data in the context of their chronic comorbidities is felt to place them at high risk for further clinical deterioration. Furthermore, it is not anticipated that the patient will be medically stable for discharge from the hospital within 2 midnights of admission.   * I certify that at the point of admission it is my clinical judgment that the patient will require inpatient hospital care spanning beyond 2 midnights from the point of admission due to high intensity of service, high risk for further deterioration and high frequency of surveillance required.*   ------- I spent 55 minutes reviewing previous labs/notes, obtaining separate history at the bedside, counseling/discussing the treatment plan outlined above, ordering medications/tests, and performing clinical documentation.  Author: Arne Langdon, MD 01/12/2024 12:20 PM  For on call review [email or web address].

## 2024-01-12 NOTE — ED Triage Notes (Signed)
 PT BIB [facility] for Resp. Distress, arriving on cpap.Kelly Gibson She woke up this morning SOB. Inhaler wasn't working.  EMS endorses significant wheezing and rhonchi in all fields.  EMS also state that pt endorses not taking her Lasix  as prescribed.  PT also has a recent identified mass on lung and dx of some cardiac blockages. She had declined cardiac cath with stents but has changed her mind and would like us  to let Cards know.   EMS gave 2 Duonebs, 2 albuterol  nebs, 125 solumedrolm and 2G Mag.  118/76 HR 60's, CBG 172

## 2024-01-12 NOTE — TOC CM/SW Note (Signed)
 Transition of Care [facility]) - Inpatient Brief Assessment   Patient Details  Name: Kelly Gibson MRN: [id number] Date of Birth: 11-29-1960  Transition of [facility]) CM/SW Contact:    Juliane Och, LCSW Phone Number: 01/12/2024, 5:38 PM   Clinical Narrative:  5:38 PM Per chart review, patient resides at home with spouse. Patient has a PCP and insurance. Patient has CIR but not SNF history. Patient has recent HH history with [facility]. Patient has DME history (cane, rolling walker, stair lift chair). Patient's preferred pharmacy's are [facility] [phone] [facility] and Ar[facility] Ben[facility]. No TOC needs were identified at this time. TOC will continue to follow and be available to assist.   Transition of Care Asessment: Insurance and Status: Insurance coverage has been reviewed Patient has primary care physician: Yes Home environment has been reviewed: Private Residence Prior level of function:: N/A Prior/Current Home Services: No current home services (Has HH/DME History) Social Drivers of Health Review: SDOH reviewed no interventions necessary Readmission risk has been reviewed: Yes Transition of care needs: no transition of care needs at this time

## 2024-01-12 NOTE — Consult Note (Signed)
 NAME:  [name], MRN:  [id number], DOB:  02-12-1961, LOS: 0 ADMISSION DATE:  01/12/2024, CONSULTATION DATE:  01/12/24 REFERRING MD:  Sulema Endo, CHIEF COMPLAINT:  shortness of breath   History of Present Illness:  [name] is a 63 y.o. F with PMH significant for CAD and recent  NSTEMI with recent LHC showing three vessel disease with chronic total occlusions and collaterals, pt has been undecided on whether to pursue intervention, pt also underwent bronchoscopy with Dr. Baldwin Levee for persistent LLL infiltrate with cytology positive for NSCLC and has outpatient follow up scheduled.  She presented 6/18 with acutely worsening shortness of breath and required 4L [place] in the ED.  CXR with likely pulmonary edema.  Labs with mild leukocytosis and BNP consistent with recent baseline at 667, troponin 40.  She is admitted to [facility] and pulmonary asked to consult in light of her recent diagnosis  Pertinent  Medical History   has a past medical history of Bradycardia, Chronic kidney disease, COPD (chronic obstructive pulmonary disease) (HCC), Depression, Depression with anxiety (08/29/2021), Diabetes mellitus, Dysarthria, post-stroke, Dysrhythmia, Ganglion cyst (03/09/2012), History of kidney stones, Hyperlipidemia, Hypertension, Hypokalemia, Myocardial infarction (HCC), Obesity, unspecified (12/17/2011), Pneumonia, Pneumonia of left lower lobe due to infectious organism (12/12/2023), PONV (postoperative nausea and vomiting), Skin lesion of face (11/17/2022), and Stroke (HCC).   Significant Hospital Events: Including procedures, antibiotic start and stop dates in addition to other pertinent events     Interim History / Subjective:    Objective    Blood pressure 127/76, pulse (!) 53, temperature 98.2 F (36.8 C), temperature source Oral, resp. rate (!) 29, last menstrual period 12/10/2012, SpO2 100%.    Vent Mode: PSV;BIPAP FiO2 (%):  [40 %] 40 % PEEP:  [5 cmH20] 5 cmH20 Pressure Support:  [5 cmH20] 5  cmH20  No intake or output data in the 24 hours ending 01/12/24 1302 There were no vitals filed for this visit.  Examination: Blood pressure 127/76, pulse (!) 53, temperature 98.2 F (36.8 C), temperature source Oral, resp. rate (!) 29, last menstrual period 12/10/2012, SpO2 100%. Gen:      No acute distress HEENT:  EOMI, sclera anicteric Neck:     No masses; no thyromegaly Lungs:    Clear to auscultation bilaterally; normal respiratory effort CV:         Regular rate and rhythm; no murmurs Abd:      + bowel sounds; soft, non-tender; no palpable masses, no distension Ext:   Trace edema; adequate peripheral perfusion Neuro: alert and oriented x 3 Psych: normal mood and affect   Labs/Imaging personally reviewed, significant for Chest x-ray reviewed with low lung volumes, pulm (congestion, edema with left retrocardiac opacity. BUN/creatinine 27/2.29  Resolved problem list   Assessment and Plan  Acute hypoxic respiratory failure Acute on chronic heart failure in setting of coronary artery disease and recent NSTEMI History of COPD with new diagnosis of left lower lobe adenocarcinoma Continue IV Lasix  for diuresis CT angiogram to evaluate for PE Does not appear to be in COPD flare.  Continue inhalers, DuoNebs.  No need for steroids Wean down oxygen as tolerated  I have discussed the diagnosis of lung cancer from recent bronchoscopy.  She is going to get evaluated by oncology to plan for next steps of treatment Will need PET scan as outpatient for staging  Best Practice (right click and Reselect all SmartList Selections daily)   Per primary team  Signature:   Karenann Mcgrory MD [facility] See Amion for pager  If no response to pager , please call [phone] until 7pm After 7:00 pm call Elink  [phone] 01/12/2024, 2:14 PM

## 2024-01-12 NOTE — Plan of Care (Signed)

## 2024-01-12 NOTE — Progress Notes (Signed)
 PHARMACY - ANTICOAGULATION CONSULT NOTE  Pharmacy Consult for heparin  Indication: pulmonary embolus  Labs: Recent Labs    01/10/24 1508 01/12/24 0748 01/12/24 0748 01/12/24 0812 01/12/24 1427 01/12/24 2150  HGB 11.5 10.1*   < > 10.5* 9.8*  --   HCT 36.6 34.9*  --  31.0* 32.6*  --   PLT 343 314  --   --  286  --   HEPARINUNFRC  --   --   --   --   --  0.95*  CREATININE 1.29* 1.30*  --   --  1.21*  --   TROPONINIHS  --  40*  --   --  199*  --    < > = values in this interval not displayed.   Assessment: 63yo female supratherapeutic on heparin  with initial dosing for PE though bolus was given late and could have affected level; no infusion issues or signs of bleeding per RN.  Goal of Therapy:  Heparin  level 0.3-0.7 units/ml   Plan:  Decrease heparin  infusion slightly to 1200 units/hr. Check level in 8 hours.   Lonnie Roberts, PharmD, BCPS 01/12/2024 11:12 PM

## 2024-01-12 NOTE — ED Provider Notes (Signed)
 [place] EMERGENCY DEPARTMENT AT [facility] Provider Note  CSN: [id number] Arrival date & time: 01/12/24 0736  Chief Complaint(s) Respiratory Distress and Bipap  HPI Kelly Gibson is a 63 y.o. female with past medical history as below, significant for COPD not on home O2, CVA w/ right sided weakness, depression, CKD who presents to the ED with complaint of dib  Pt report she woke up this morning and felt like she couldn't breathe. She does not wear home oxygen. On ems arrival pt was hypoxic with increased WOB, placed on CPAP with improvement to respiratory status.   Pt feeling somewhat better on arrival, was able to come off CPAP For a few minutes without becoming dyspneic again and was transitioned to BIPAP. Reports intermittent lasix  compliance. no recent fever/chills, no n/v, no sig chest pain. She has some slight RLE swelling which is chronic per pt. She denies sick contacts or recent travel. She lives with husband.   Past Medical History Past Medical History:  Diagnosis Date   Bradycardia    SR/SB with up to 3.5 second pause (asymptomatic) on ~ 10/2017 event monitor; saw EP Dr. Nunzio Belch: avoid AV nodal blocking agents   Chronic kidney disease    COPD (chronic obstructive pulmonary disease) (HCC)    Depression    Depression with anxiety 08/29/2021   Diabetes mellitus    Dysarthria, post-stroke    Dysrhythmia    Ganglion cyst 03/09/2012   History of kidney stones    Hyperlipidemia    Hypertension    Hypokalemia    Myocardial infarction (HCC)    NSTEMI   Obesity, unspecified 12/17/2011   Pneumonia    Pneumonia of left lower lobe due to infectious organism 12/12/2023   PONV (postoperative nausea and vomiting)    Skin lesion of face 11/17/2022   Stroke (HCC)    2019   right side hemiparesis   Patient Active Problem List   Diagnosis Date Noted   Acute hypoxemic respiratory failure (HCC) 01/12/2024   Mass of lower lobe of left lung 01/03/2024   Mediastinal  adenopathy 01/03/2024   Unstable angina (HCC) 12/30/2023   Coronary artery calcification 12/12/2023   Preop cardiovascular exam 12/12/2023   Sick sinus syndrome (HCC) 12/12/2023   Lactic acidosis 12/12/2023   Non-ST elevation (NSTEMI) myocardial infarction (HCC) 12/11/2023   Heart block AV complete (HCC) 12/11/2023   Bacteremia 12/11/2023   Acute on chronic diastolic heart failure (HCC) 12/09/2023   Major depression in remission (HCC) 04/25/2023   Hyperlipidemia associated with type 2 diabetes mellitus (HCC) 08/29/2021   Insomnia 11/04/2020   Hemiparesis affecting right side as late effect of cerebrovascular accident (CVA) (HCC) 07/04/2020   Chronic kidney disease (CKD), stage III (moderate) (HCC) 05/16/2019   Dysphagia, post-stroke    Cerebrovascular disease 10/18/2017   COPD (chronic obstructive pulmonary disease) (HCC) 02/18/2015   Hypertension associated with diabetes (HCC) 12/17/2011   Type 2 diabetes mellitus with diabetic neuropathy, with long-term current use of insulin  (HCC) 12/14/2011   Home Medication(s) Prior to Admission medications   Medication Sig Start Date End Date Taking? Authorizing Provider  albuterol  (PROVENTIL ) (2.5 MG/3ML) 0.083% nebulizer solution Inhale 3 mLs (2.5 mg total) into the lungs every 6 (six) hours as needed for wheezing or shortness of breath. 01/04/24  Yes Aisha Hove, MD  albuterol  (VENTOLIN  HFA) 108 (90 Base) MCG/ACT inhaler Inhale 2 puffs into the lungs every 6 (six) hours as needed for wheezing or shortness of breath. 12/14/23  Yes Pauline Bos  Marieta Shorten., MD  aspirin  EC 81 MG tablet Take 1 tablet (81 mg total) by mouth daily. Swallow whole. 12/15/23  Yes Etter Hermann., MD  budesonide -glycopyrrolate -formoterol  (BREZTRI  AEROSPHERE) 160-9-4.8 MCG/ACT AERO inhaler Inhale 2 puffs into the lungs 2 (two) times daily. 12/14/23  Yes Etter Hermann., MD  citalopram  (CELEXA ) 10 MG tablet TAKE 1 TABLET BY MOUTH DAILY 01/06/24  Yes Early,  Sara E, NP  Doxylamine Succinate, Sleep, (UNISOM PO) Take 2 each by mouth at bedtime. Gel capsules   Yes [provider]  furosemide  (LASIX ) 20 MG tablet Take 1 tablet (20 mg total) by mouth daily as needed. 10/05/22  Yes Early, Sara E, NP  HUMALOG  KWIKPEN 100 UNIT/ML KwikPen INJECT 3 TO 5 UNITS INTO THE SKIN DAILY BEFORE LUNCH 11/29/23  Yes Early, Sara E, NP  hydrALAZINE  (APRESOLINE ) 100 MG tablet 1 tablet up to every 12 hours only as needed for blood pressure >150/90 01/10/24  Yes Early, Adriane Albe, NP  insulin  degludec (TRESIBA  FLEXTOUCH) 100 UNIT/ML FlexTouch Pen Inject 10 Units into the skin 2 (two) times daily. Adjust by 2 units every 3 days as needed for blood sugars higher than 150 in the morning fasting. 10/05/22  Yes Early, Sara E, NP  Melatonin 5 MG CHEW Chew 10 mg by mouth at bedtime.   Yes [provider]  Multiple Vitamins-Minerals (MULTIVITAMIN GUMMIES WOMENS) CHEW Chew 2 each by mouth in the morning.   Yes [provider]  olmesartan (BENICAR) 40 MG tablet Take 40 mg by mouth in the morning. 05/12/23  Yes [provider]  rosuvastatin  (CRESTOR ) 40 MG tablet TAKE 1 TABLET BY MOUTH DAILY 04/08/23  Yes Early, Sara E, NP  UNABLE TO FIND Take 1 tablet by mouth with breakfast, with lunch, and with evening meal. GlucoGold -Berberine, Concentrated Cinnamon, Chromium, Banaba Leaf Extract   Yes [provider]  Continuous Glucose Sensor (DEXCOM G7 SENSOR) MISC Take 1 Device by mouth See admin instructions. Change sensor every 10 days    [provider]  TRADJENTA  5 MG TABS tablet Take 5 mg by mouth daily. Patient not taking: Reported on 01/12/2024    [provider]                                                                                                                                    Past Surgical History Past Surgical History:  Procedure Laterality Date   CESAREAN SECTION     x 3   LEFT HEART CATH AND CORONARY ANGIOGRAPHY N/A  12/28/2023   Procedure: LEFT HEART CATH AND CORONARY ANGIOGRAPHY;  Surgeon: Cody Das, MD;  Location: [facility] INVASIVE CV LAB;  Service: Cardiovascular;  Laterality: N/A;   ORIF WRIST FRACTURE Right 05/04/2018   Procedure: OPEN REDUCTION INTERNAL FIXATION (ORIF) RIGHT  WRIST FRACTURE;  Surgeon: Micheline Ahr, MD;  Location: [facility] OR;  Service: Orthopedics;  Laterality: Right;  TUMOR REMOVAL     left shoulder   VIDEO BRONCHOSCOPY WITH ENDOBRONCHIAL NAVIGATION  01/03/2024   Procedure: VIDEO BRONCHOSCOPY WITH ENDOBRONCHIAL NAVIGATION;  Surgeon: Denson Flake, MD;  Location: [facility] ENDOSCOPY;  Service: Pulmonary;;   VIDEO BRONCHOSCOPY WITH ENDOBRONCHIAL ULTRASOUND Bilateral 01/03/2024   Procedure: BRONCHOSCOPY, WITH EBUS;  Surgeon: Denson Flake, MD;  Location[facility] ENDOSCOPY;  Service: Pulmonary;  Laterality: Bilateral;   Family History Family History  Problem Relation Age of Onset   Diabetes Mother    Stroke Mother    Heart disease Father    Cancer Father    Stroke Father     Social History Social History   Tobacco Use   Smoking status: Former    Current packs/day: 1.00    Average packs/day: 1 pack/day for 20.0 years (20.0 ttl pk-yrs)    Types: Cigarettes   Smokeless tobacco: Never   Tobacco comments:    stopped 2018  Vaping Use   Vaping status: Never Used  Substance Use Topics   Alcohol use: No   Drug use: No   Allergies Latex and Niacin  Review of Systems A thorough review of systems was obtained and all systems are negative except as noted in the HPI and PMH.   Physical Exam Vital Signs  I have reviewed the triage vital signs BP 127/76   Pulse (!) 53   Temp 98.2 F (36.8 C) (Oral)   Resp (!) 29   LMP 12/10/2012 (Exact Date)   SpO2 100%  Physical Exam Vitals and nursing note reviewed. Exam conducted with a chaperone present.  Constitutional:      General: She is in acute distress.     Appearance: Normal appearance.  HENT:     Head: Normocephalic and atraumatic.      Right Ear: External ear normal.     Left Ear: External ear normal.     Nose: Nose normal.     Mouth/Throat:     Mouth: Mucous membranes are moist.   Eyes:     General: No scleral icterus.       Right eye: No discharge.        Left eye: No discharge.    Cardiovascular:     Rate and Rhythm: Normal rate and regular rhythm.     Pulses: Normal pulses.     Heart sounds: Normal heart sounds.  Pulmonary:     Effort: Tachypnea and respiratory distress present.     Breath sounds: No stridor. Decreased breath sounds and wheezing present.  Abdominal:     General: Abdomen is flat. There is no distension.     Palpations: Abdomen is soft.     Tenderness: There is no abdominal tenderness.   Musculoskeletal:     Cervical back: No rigidity.     Right lower leg: Edema present.     Left lower leg: No edema.   Skin:    General: Skin is warm and dry.     Capillary Refill: Capillary refill takes less than 2 seconds.   Neurological:     Mental Status: She is alert.   Psychiatric:        Mood and Affect: Mood normal.        Behavior: Behavior normal. Behavior is cooperative.     ED Results and Treatments Labs (all labs ordered are listed, but only abnormal results are displayed) Labs Reviewed  BASIC METABOLIC PANEL WITH GFR - Abnormal; Notable for the following components:      Result Value  CO2 20 (*)    Glucose, Bld 171 (*)    Creatinine, Ser 1.30 (*)    Calcium  8.7 (*)    GFR, Estimated 47 (*)    All other components within normal limits  CBC WITH DIFFERENTIAL/PLATELET - Abnormal; Notable for the following components:   WBC 11.8 (*)    RBC 3.74 (*)    Hemoglobin 10.1 (*)    HCT 34.9 (*)    MCHC 28.9 (*)    RDW 16.3 (*)    Neutro Abs 8.8 (*)    All other components within normal limits  BRAIN NATRIURETIC PEPTIDE - Abnormal; Notable for the following components:   B Natriuretic Peptide 667.3 (*)    All other components within normal limits  I-STAT VENOUS BLOOD GAS, ED  - Abnormal; Notable for the following components:   pCO2, Ven 39.3 (*)    pO2, Ven 79 (*)    Calcium , Ion 1.11 (*)    HCT 31.0 (*)    Hemoglobin 10.5 (*)    All other components within normal limits  TROPONIN I (HIGH SENSITIVITY) - Abnormal; Notable for the following components:   Troponin I (High Sensitivity) 40 (*)    All other components within normal limits  RESP PANEL BY RT-PCR (RSV, FLU A&B, COVID)  RVPGX2  CULTURE, BLOOD (ROUTINE X 2)  CULTURE, BLOOD (ROUTINE X 2)  CBC  CREATININE, SERUM  TROPONIN I (HIGH SENSITIVITY)                                                                                                                          Radiology DG Chest Port 1 View Result Date: 01/12/2024 CLINICAL DATA:  Shortness of breath. EXAM: PORTABLE CHEST 1 VIEW COMPARISON:  01/03/2024 FINDINGS: Low lung volumes. The cardio pericardial silhouette is enlarged. Vascular congestion diffuse interstitial opacities suggesting edema. Retrocardiac left base atelectasis/infiltrate noted. No acute bony abnormality. Telemetry leads overlie the chest. IMPRESSION: 1. Low volume film with vascular congestion and diffuse interstitial opacities suggesting edema. 2. Retrocardiac left base atelectasis/infiltrate. Electronically Signed   By: Donnal Fusi M.D.   On: 01/12/2024 09:32    Pertinent labs & imaging results that were available during my care of the patient were reviewed by me and considered in my medical decision making (see MDM for details).  Medications Ordered in ED Medications  azithromycin  (ZITHROMAX ) 500 mg in sodium chloride  0.9 % 250 mL IVPB (has no administration in time range)  enoxaparin  (LOVENOX ) injection 40 mg (has no administration in time range)  ipratropium-albuterol  (DUONEB) 0.5-2.5 (3) MG/3ML nebulizer solution 3 mL (has no administration in time range)  aspirin  EC tablet 81 mg (has no administration in time range)  budesonide -glycopyrrolate -formoterol  (BREZTRI ) 160-9-4.8  MCG/ACT inhaler 2 puff (0 puffs Inhalation Hold 01/12/24 1320)  citalopram  (CELEXA ) tablet 10 mg (has no administration in time range)  irbesartan (AVAPRO) tablet 150 mg (has no administration in time range)  rosuvastatin  (CRESTOR ) tablet 40 mg (has no administration in time  range)  insulin  aspart (novoLOG ) injection 0-15 Units (has no administration in time range)  furosemide  (LASIX ) injection 40 mg (has no administration in time range)  furosemide  (LASIX ) injection 40 mg (40 mg Intravenous Given 01/12/24 1205)  cefTRIAXone  (ROCEPHIN ) 1 g in sodium chloride  0.9 % 100 mL IVPB (0 g Intravenous Stopped 01/12/24 1317)                                                                                                                                     Procedures .Critical Care  Performed by: Teddi Favors, DO Authorized by: Teddi Favors, DO   Critical care provider statement:    Critical care time (minutes):  32   Critical care was necessary to treat or prevent imminent or life-threatening deterioration of the following conditions:  Respiratory failure   Critical care was time spent personally by me on the following activities:  Development of treatment plan with patient or surrogate, discussions with consultants, evaluation of patient's response to treatment, examination of patient, ordering and review of laboratory studies, ordering and review of radiographic studies, ordering and performing treatments and interventions, pulse oximetry, re-evaluation of patient's condition, review of old charts and obtaining history from patient or surrogate   Care discussed with: admitting provider     (including critical care time)  Medical Decision Making / ED Course    Medical Decision Making:    ANGELLI BARUCH is a 63 y.o. female with past medical history as below, significant for COPD not on home O2, CVA w/ right sided weakness, depression, CKD who presents to the ED with complaint of dib. The  complaint involves an extensive differential diagnosis and also carries with it a high risk of complications and morbidity.  Serious etiology was considered. Ddx includes but is not limited to: In my evaluation of this patient's dyspnea my DDx includes, but is not limited to, pneumonia, pulmonary embolism, pneumothorax, pulmonary edema, metabolic acidosis, asthma, COPD, cardiac cause, anemia, anxiety, etc.    Complete initial physical exam performed, notably the patient was in acute distress, on cpap, briefly off but then put on BIPAP.    Reviewed and confirmed nursing documentation for past medical history, family history, social history.  Vital signs reviewed.     Brief summary:  63  yo/f w/ hx COPD, CVA w/ right sided weakness, CKD here w/ resp distress Felt normal last night  Was admitted earlier this month after CP following LHC on 6/7 with 3 vessel disease (3 chronic total occlusions w/ collaterals) she underwent PPM eval by EP but no indication for PPM at that time.  Found to have LLL mass; She had broncosopy with EBUS and bx on 6/9 Dr Baldwin Levee; pt tolerated well Started on augmentin  pending bronch results She was undecided on CABG and was advised to f/u CT surgery o/p; she has complex cardiac disease. Was seen by Dr Deloise Ferries during recent admission  Clinical Course as of 01/12/24 1333  Wed Jan 12, 2024  1610 Pt reports she is now interested in pursuing CABG [SG]  1011 Resp status improved, now on 4LNC [SG]  1027 Workup concerning for acute on chronic CHF also w/ possible PNA. Started abx and lasix . Will plan for admission. Will also consult cardiothoracic surgery as pt is amenable to CABG at this time.  [SG]    Clinical Course User Index [SG] Teddi Favors, DO   Concern for multifocal PNA during recent admission with also concurrent lung mass. She has findings concerning for possible PNA left / retrocardiac on her XR today. Given respiratory distress will cover with empiric abx  for pos pna.  Admit hospitalist Dr Bertis Brochure            Additional history obtained: -Additional history obtained from family and ems -External records from outside source obtained and reviewed including: Chart review including previous notes, labs, imaging, consultation notes including  Recent admit Prior labs Home meds   Lab Tests: -I ordered, reviewed, and interpreted labs.   The pertinent results include:   Labs Reviewed  BASIC METABOLIC PANEL WITH GFR - Abnormal; Notable for the following components:      Result Value   CO2 20 (*)    Glucose, Bld 171 (*)    Creatinine, Ser 1.30 (*)    Calcium  8.7 (*)    GFR, Estimated 47 (*)    All other components within normal limits  CBC WITH DIFFERENTIAL/PLATELET - Abnormal; Notable for the following components:   WBC 11.8 (*)    RBC 3.74 (*)    Hemoglobin 10.1 (*)    HCT 34.9 (*)    MCHC 28.9 (*)    RDW 16.3 (*)    Neutro Abs 8.8 (*)    All other components within normal limits  BRAIN NATRIURETIC PEPTIDE - Abnormal; Notable for the following components:   B Natriuretic Peptide 667.3 (*)    All other components within normal limits  I-STAT VENOUS BLOOD GAS, ED - Abnormal; Notable for the following components:   pCO2, Ven 39.3 (*)    pO2, Ven 79 (*)    Calcium , Ion 1.11 (*)    HCT 31.0 (*)    Hemoglobin 10.5 (*)    All other components within normal limits  TROPONIN I (HIGH SENSITIVITY) - Abnormal; Notable for the following components:   Troponin I (High Sensitivity) 40 (*)    All other components within normal limits  RESP PANEL BY RT-PCR (RSV, FLU A&B, COVID)  RVPGX2  CULTURE, BLOOD (ROUTINE X 2)  CULTURE, BLOOD (ROUTINE X 2)  CBC  CREATININE, SERUM  TROPONIN I (HIGH SENSITIVITY)    Notable for as above, BNP + trop +  EKG   EKG Interpretation Date/Time:  Wednesday January 12 2024 07:46:52 EDT Ventricular Rate:  70 PR Interval:  68 QRS Duration:  117 QT Interval:  414 QTC Calculation: 414 R  Axis:   -26  Text Interpretation: Ectopic atrial rhythm Supraventricular bigeminy Short PR interval Nonspecific intraventricular conduction delay Inferior infarct, old Anterolateral infarct, age indeterminate similar to prior Confirmed by Russella Courts ([id number]) on 01/12/2024 8:45:41 AM         Imaging Studies ordered: I ordered imaging studies including cxr I independently visualized the following imaging with scope of interpretation limited to determining acute life threatening conditions related to emergency care; findings noted above I agree with the radiologist interpretation If any imaging was obtained with contrast I closely  monitored patient for any possible adverse reaction a/w contrast administration in the emergency department   Medicines ordered and prescription drug management: Meds ordered this encounter  Medications   furosemide  (LASIX ) injection 40 mg   cefTRIAXone  (ROCEPHIN ) 1 g in sodium chloride  0.9 % 100 mL IVPB    Antibiotic Indication::   CAP   azithromycin  (ZITHROMAX ) 500 mg in sodium chloride  0.9 % 250 mL IVPB    Antibiotic Indication::   CAP   enoxaparin  (LOVENOX ) injection 40 mg   ipratropium-albuterol  (DUONEB) 0.5-2.5 (3) MG/3ML nebulizer solution 3 mL   aspirin  EC tablet 81 mg    Swallow whole.     budesonide -glycopyrrolate -formoterol  (BREZTRI ) 160-9-4.8 MCG/ACT inhaler 2 puff   citalopram  (CELEXA ) tablet 10 mg   irbesartan (AVAPRO) tablet 150 mg   rosuvastatin  (CRESTOR ) tablet 40 mg    TAKE 1 TABLET BY MOUTH DAILY     insulin  aspart (novoLOG ) injection 0-15 Units    Correction coverage::   Moderate (average weight, post-op)    CBG < 70::   Implement Hypoglycemia Standing Orders and refer to Hypoglycemia Standing Orders sidebar report    CBG 70 - 120::   0 units    CBG 121 - 150::   2 units    CBG 151 - 200::   3 units    CBG 201 - 250::   5 units    CBG 251 - 300::   8 units    CBG 301 - 350::   11 units    CBG 351 - 400::   15 units    CBG > 400:    call MD and obtain STAT lab verification   furosemide  (LASIX ) injection 40 mg    -I have reviewed the patients home medicines and have made adjustments as needed   Consultations Obtained: I requested consultation with the hospitalist, ct surgery,  and discussed lab and imaging findings as well as pertinent plan    Cardiac Monitoring: The patient was maintained on a cardiac monitor.  I personally viewed and interpreted the cardiac monitored which showed an underlying rhythm of: sinus brady Continuous pulse oximetry interpreted by myself, 84% on RA.    Social Determinants of Health:  Diagnosis or treatment significantly limited by social determinants of health: former smoker   Reevaluation: After the interventions noted above, I reevaluated the patient and found that they have improved  Co morbidities that complicate the patient evaluation  Past Medical History:  Diagnosis Date   Bradycardia    SR/SB with up to 3.5 second pause (asymptomatic) on ~ 10/2017 event monitor; saw EP Dr. Nunzio Belch: avoid AV nodal blocking agents   Chronic kidney disease    COPD (chronic obstructive pulmonary disease) (HCC)    Depression    Depression with anxiety 08/29/2021   Diabetes mellitus    Dysarthria, post-stroke    Dysrhythmia    Ganglion cyst 03/09/2012   History of kidney stones    Hyperlipidemia    Hypertension    Hypokalemia    Myocardial infarction [facility])    NSTEMI   Obesity, unspecified 12/17/2011   Pneumonia    Pneumonia of left lower lobe due to infectious organism 12/12/2023   PONV (postoperative nausea and vomiting)    Skin lesion of face 11/17/2022   Stroke [facility])    2019   right side hemiparesis      Dispostion: Disposition decision including need for hospitalization was considered, and patient admitted to the hospital.    Final Clinical Impression(s) / ED  Diagnoses Final diagnoses:  Respiratory distress  Acute congestive heart failure, unspecified heart failure type  [facility])  Community acquired pneumonia of left lower lobe of lung        Teddi Favors, DO 01/12/24 1333

## 2024-01-12 NOTE — Progress Notes (Signed)
 PHARMACY - ANTICOAGULATION CONSULT NOTE  Pharmacy Consult for heparin  Indication: pulmonary embolus  Allergies  Allergen Reactions   Latex Itching and Rash   Niacin Nausea And Vomiting    Patient Measurements: Weight: 86.7 kg (191 lb 2.2 oz) Heparin  dosing wt ~80 kg  Vital Signs: Temp: 98.4 F (36.9 C) (06/18 1509) Temp Source: Oral (06/18 1509) BP: 134/58 (06/18 1509) Pulse Rate: 57 (06/18 1430)  Labs: Recent Labs    01/10/24 1508 01/12/24 0748 01/12/24 0748 01/12/24 0812 01/12/24 1427  HGB 11.5 10.1*   < > 10.5* 9.8*  HCT 36.6 34.9*  --  31.0* 32.6*  PLT 343 314  --   --  286  CREATININE 1.29* 1.30*  --   --  1.21*  TROPONINIHS  --  40*  --   --   --    < > = values in this interval not displayed.    Estimated Creatinine Clearance: 50.9 mL/min (A) (by C-G formula based on SCr of 1.21 mg/dL (H)).   Medical History: Past Medical History:  Diagnosis Date   Bradycardia    SR/SB with up to 3.5 second pause (asymptomatic) on ~ 10/2017 event monitor; saw EP Dr. Nunzio Belch: avoid AV nodal blocking agents   Chronic kidney disease    COPD (chronic obstructive pulmonary disease) (HCC)    Depression    Depression with anxiety 08/29/2021   Diabetes mellitus    Dysarthria, post-stroke    Dysrhythmia    Ganglion cyst 03/09/2012   History of kidney stones    Hyperlipidemia    Hypertension    Hypokalemia    Myocardial infarction [facility])    NSTEMI   Obesity, unspecified 12/17/2011   Pneumonia    Pneumonia of left lower lobe due to infectious organism 12/12/2023   PONV (postoperative nausea and vomiting)    Skin lesion of face 11/17/2022   Stroke [facility])    2019   right side hemiparesis    Medications:  Medications Prior to Admission  Medication Sig Dispense Refill Last Dose/Taking   albuterol  (PROVENTIL ) (2.5 MG/3ML) 0.083% nebulizer solution Inhale 3 mLs (2.5 mg total) into the lungs every 6 (six) hours as needed for wheezing or shortness of breath. 75 mL 12 Past Month    albuterol  (VENTOLIN  HFA) 108 (90 Base) MCG/ACT inhaler Inhale 2 puffs into the lungs every 6 (six) hours as needed for wheezing or shortness of breath. 18 g 2 Past Month   aspirin  EC 81 MG tablet Take 1 tablet (81 mg total) by mouth daily. Swallow whole. 30 tablet 12 01/11/2024   budesonide -glycopyrrolate -formoterol  (BREZTRI  AEROSPHERE) 160-9-4.8 MCG/ACT AERO inhaler Inhale 2 puffs into the lungs 2 (two) times daily. 10.7 g 1 01/11/2024   citalopram  (CELEXA ) 10 MG tablet TAKE 1 TABLET BY MOUTH DAILY 90 tablet 1 01/11/2024   Doxylamine Succinate, Sleep, (UNISOM PO) Take 2 each by mouth at bedtime. Gel capsules   01/11/2024   furosemide  (LASIX ) 20 MG tablet Take 1 tablet (20 mg total) by mouth daily as needed. 90 tablet 1 Past Month   HUMALOG  KWIKPEN 100 UNIT/ML KwikPen INJECT 3 TO 5 UNITS INTO THE SKIN DAILY BEFORE LUNCH 6 mL 3 01/11/2024   hydrALAZINE  (APRESOLINE ) 100 MG tablet 1 tablet up to every 12 hours only as needed for blood pressure >150/90   Taking   insulin  degludec (TRESIBA  FLEXTOUCH) 100 UNIT/ML FlexTouch Pen Inject 10 Units into the skin 2 (two) times daily. Adjust by 2 units every 3 days as needed for blood sugars  higher than 150 in the morning fasting. 9 mL 11 01/11/2024   Melatonin 5 MG CHEW Chew 10 mg by mouth at bedtime.   01/11/2024   Multiple Vitamins-Minerals (MULTIVITAMIN GUMMIES WOMENS) CHEW Chew 2 each by mouth in the morning.   01/11/2024   olmesartan (BENICAR) 40 MG tablet Take 40 mg by mouth in the morning.   01/11/2024   rosuvastatin  (CRESTOR ) 40 MG tablet TAKE 1 TABLET BY MOUTH DAILY 90 tablet 3 01/11/2024   UNABLE TO FIND Take 1 tablet by mouth with breakfast, with lunch, and with evening meal. GlucoGold -[place], Concentrated Cinnamon, Chromium, Banaba Leaf Extract   01/11/2024   Continuous Glucose Sensor (DEXCOM G7 SENSOR) MISC Take 1 Device by mouth See admin instructions. Change sensor every 10 days      TRADJENTA  5 MG TABS tablet Take 5 mg by mouth daily. (Patient not  taking: Reported on 01/12/2024)   Not Taking    Assessment: 63 y.o. F presents with SOB. Chest CT shows left lower lobe segmental pulmonary emboli. To begin heparin . No AC PTA. Hgb 9.8, plt wnl. Lovenox  40mg  given 6/18 ~1400   Goal of Therapy:  Heparin  level 0.3-0.7 units/ml Monitor platelets by anticoagulation protocol: Yes   Plan:  D/c Lovenox  Heparin  IV bolus 2500 units (1/2 bolus with recent Lovenox  40mg  given)  Heparin  gtt at 1250 units/hr Will f/u heparin  level in 6 hours Daily heparin  level and CBC  Enrigue Harvard, PharmD, BCPS Please see amion for complete clinical pharmacist phone list 01/12/2024,3:26 PM

## 2024-01-13 ENCOUNTER — Other Ambulatory Visit: Payer: Self-pay | Admitting: Hematology and Oncology

## 2024-01-13 ENCOUNTER — Telehealth (HOSPITAL_COMMUNITY): Payer: Self-pay | Admitting: Pharmacy Technician

## 2024-01-13 ENCOUNTER — Telehealth: Payer: Self-pay | Admitting: Hematology and Oncology

## 2024-01-13 ENCOUNTER — Other Ambulatory Visit: Payer: Self-pay

## 2024-01-13 ENCOUNTER — Encounter (HOSPITAL_COMMUNITY): Payer: Self-pay | Admitting: Hospitalist

## 2024-01-13 ENCOUNTER — Other Ambulatory Visit (HOSPITAL_COMMUNITY): Payer: Self-pay

## 2024-01-13 ENCOUNTER — Inpatient Hospital Stay (HOSPITAL_COMMUNITY)

## 2024-01-13 DIAGNOSIS — I5033 Acute on chronic diastolic (congestive) heart failure: Secondary | ICD-10-CM

## 2024-01-13 DIAGNOSIS — C3432 Malignant neoplasm of lower lobe, left bronchus or lung: Secondary | ICD-10-CM

## 2024-01-13 DIAGNOSIS — N1831 Chronic kidney disease, stage 3a: Secondary | ICD-10-CM

## 2024-01-13 DIAGNOSIS — E1129 Type 2 diabetes mellitus with other diabetic kidney complication: Secondary | ICD-10-CM

## 2024-01-13 DIAGNOSIS — R0602 Shortness of breath: Secondary | ICD-10-CM | POA: Diagnosis not present

## 2024-01-13 DIAGNOSIS — I2699 Other pulmonary embolism without acute cor pulmonale: Secondary | ICD-10-CM | POA: Diagnosis not present

## 2024-01-13 DIAGNOSIS — J9601 Acute respiratory failure with hypoxia: Secondary | ICD-10-CM | POA: Diagnosis not present

## 2024-01-13 HISTORY — DX: Other pulmonary embolism without acute cor pulmonale: I26.99

## 2024-01-13 LAB — ECHOCARDIOGRAM COMPLETE BUBBLE STUDY
AR max vel: 2.34 cm2
AV Area VTI: 2.13 cm2
AV Area mean vel: 2.31 cm2
AV Mean grad: 4 mmHg
AV Peak grad: 7.5 mmHg
Ao pk vel: 1.37 m/s
Area-P 1/2: 1.74 cm2
MV VTI: 2 cm2
S' Lateral: 3.2 cm

## 2024-01-13 LAB — BASIC METABOLIC PANEL WITH GFR
Anion gap: 14 (ref 5–15)
BUN: 29 mg/dL — ABNORMAL HIGH (ref 8–23)
CO2: 24 mmol/L (ref 22–32)
Calcium: 9 mg/dL (ref 8.9–10.3)
Chloride: 102 mmol/L (ref 98–111)
Creatinine, Ser: 1.26 mg/dL — ABNORMAL HIGH (ref 0.44–1.00)
GFR, Estimated: 49 mL/min — ABNORMAL LOW (ref >60)
Glucose, Bld: 175 mg/dL — ABNORMAL HIGH (ref 70–99)
Potassium: 4.5 mmol/L (ref 3.5–5.1)
Sodium: 140 mmol/L (ref 135–145)

## 2024-01-13 LAB — CBC
HCT: 35.5 % — ABNORMAL LOW (ref 36.0–46.0)
Hemoglobin: 10.6 g/dL — ABNORMAL LOW (ref 12.0–15.0)
MCH: 26.8 pg (ref 26.0–34.0)
MCHC: 29.9 g/dL — ABNORMAL LOW (ref 30.0–36.0)
MCV: 89.6 fL (ref 80.0–100.0)
Platelets: 271 10*3/uL (ref 150–400)
RBC: 3.96 MIL/uL (ref 3.87–5.11)
RDW: 16.1 % — ABNORMAL HIGH (ref 11.5–15.5)
WBC: 12.2 10*3/uL — ABNORMAL HIGH (ref 4.0–10.5)
nRBC: 0 % (ref 0.0–0.2)

## 2024-01-13 LAB — APTT: aPTT: 80 s — ABNORMAL HIGH (ref 24–36)

## 2024-01-13 LAB — GLUCOSE, CAPILLARY
Glucose-Capillary: 191 mg/dL — ABNORMAL HIGH (ref 70–99)
Glucose-Capillary: 302 mg/dL — ABNORMAL HIGH (ref 70–99)
Glucose-Capillary: 185 mg/dL — ABNORMAL HIGH (ref 70–99)
Glucose-Capillary: 131 mg/dL — ABNORMAL HIGH (ref 70–99)

## 2024-01-13 LAB — HEPARIN LEVEL (UNFRACTIONATED): Heparin Unfractionated: 0.98 [IU]/mL — ABNORMAL HIGH (ref 0.30–0.70)

## 2024-01-13 MED ORDER — INSULIN GLARGINE-YFGN 100 UNIT/ML ~~LOC~~ SOLN
10.0000 [IU] | Freq: Every day | SUBCUTANEOUS | Status: DC
  Administered 2024-01-13 – 2024-01-14 (×2): 10 [IU] via SUBCUTANEOUS
  Filled 2024-01-13 (×2): qty 0.1

## 2024-01-13 MED ORDER — APIXABAN 5 MG PO TABS: 5.0000 mg | ORAL_TABLET | Freq: Two times a day (BID) | ORAL | Status: DC

## 2024-01-13 MED ORDER — APIXABAN 5 MG PO TABS
10.0000 mg | ORAL_TABLET | Freq: Two times a day (BID) | ORAL | Status: DC
  Administered 2024-01-13 – 2024-01-14 (×3): 10 mg via ORAL
  Filled 2024-01-13 (×3): qty 2

## 2024-01-13 MED ORDER — FUROSEMIDE 20 MG PO TABS
20.0000 mg | ORAL_TABLET | Freq: Every day | ORAL | Status: DC
  Administered 2024-01-14: 20 mg via ORAL
  Filled 2024-01-13: qty 1

## 2024-01-13 MED ORDER — MELATONIN 5 MG PO TABS
10.0000 mg | ORAL_TABLET | Freq: Every day | ORAL | Status: DC
  Administered 2024-01-13: 10 mg via ORAL
  Filled 2024-01-13: qty 2

## 2024-01-13 MED ORDER — ACETAMINOPHEN 500 MG PO TABS: 1000.0000 mg | ORAL_TABLET | Freq: Four times a day (QID) | ORAL | Status: DC | PRN

## 2024-01-13 NOTE — Assessment & Plan Note (Addendum)
 01-13-2024 stop IV heparin . Start Eliquis. Heme/onc consulted to give input into duration of therapy. Pt without prior hx of VTE. But she now has lung cancer. ??indefinite need for systemic anticoagulation?? Hopefully, heme/onc can help answer this question.  12-2023 DC home on eliquis. F/u with heme/onc regarding duration of treatment. May need longer duration given her concomitant lung cancer.

## 2024-01-13 NOTE — Progress Notes (Signed)
 PHARMACY - ANTICOAGULATION CONSULT NOTE  Pharmacy Consult for heparin  Indication: pulmonary embolus  Allergies  Allergen Reactions   Latex Itching and Rash   Niacin Nausea And Vomiting    Patient Measurements: Weight: 86.7 kg (191 lb 2.2 oz) Heparin  dosing wt ~80 kg  Vital Signs: Temp: 97.7 F (36.5 C) (06/19 0740) Temp Source: Oral (06/19 0740) BP: 141/64 (06/19 0740) Pulse Rate: 43 (06/19 0400)  Labs: Recent Labs    01/12/24 0748 01/12/24 0812 01/12/24 1427 01/12/24 2150 01/13/24 0736  HGB 10.1* 10.5* 9.8*  --  10.6*  HCT 34.9* 31.0* 32.6*  --  35.5*  PLT 314  --  286  --  271  APTT  --   --   --   --  80*  HEPARINUNFRC  --   --   --  0.95* 0.98*  CREATININE 1.30*  --  1.21*  --  1.26*  TROPONINIHS 40*  --  199*  --   --     Estimated Creatinine Clearance: 48.9 mL/min (A) (by C-G formula based on SCr of 1.26 mg/dL (H)).   Medical History: Past Medical History:  Diagnosis Date   Bradycardia    SR/SB with up to 3.5 second pause (asymptomatic) on ~ 10/2017 event monitor; saw EP Dr. Nunzio Belch: avoid AV nodal blocking agents   Chronic kidney disease    COPD (chronic obstructive pulmonary disease) (HCC)    Depression    Depression with anxiety 08/29/2021   Diabetes mellitus    Dysarthria, post-stroke    Dysrhythmia    Ganglion cyst 03/09/2012   History of kidney stones    Hyperlipidemia    Hypertension    Hypokalemia    Myocardial infarction [facility])    NSTEMI   Obesity, unspecified 12/17/2011   Pneumonia    Pneumonia of left lower lobe due to infectious organism 12/12/2023   PONV (postoperative nausea and vomiting)    Skin lesion of face 11/17/2022   Stroke [facility]    2019   right side hemiparesis    Assessment: 63 y.o. F presents with SOB. Chest CT shows left lower lobe segmental pulmonary emboli. To begin heparin . No AC PTA. Hgb 9.8, plt wnl. Lovenox  40mg  given 6/18 ~1400.  Heparin  level is supratherapeutic at 0.98 on UFH IV infusion 1200 units/hour. CBC  is stable, no signs of bleeding. Of note, aPTT draw at the same time was within therapeutic range at 80 seconds. Will dose adjust based off heparin  level for now.   Goal of Therapy:  Heparin  level 0.3-0.7 units/ml Monitor platelets by anticoagulation protocol: Yes   Plan:  Reduce heparin  gtt at 1150 units/hr Will f/u heparin  level + aPTT in 8 hours Daily heparin  level and CBC  Albino Alu, PharmD PGY2 Cardiology Pharmacy Resident 01/13/2024,8:57 AM

## 2024-01-13 NOTE — Assessment & Plan Note (Signed)
 01-13-2024 biopsy from bronch this month shows non-small cell lung cancer. Biopsy report came back after she was discharged. Will consult heme/onc.  01-14-2024 f/u with oncology as outpatient.

## 2024-01-13 NOTE — Telephone Encounter (Signed)
 I met with the patient and her husband briefly in the hospital.  This is just a courtesy visit. I was notified by the hospitalist that she is being admitted to the hospital for respiratory distress.  She was just diagnosed with lung cancer. I introduced myself to the patient and her husband.  She needs to recover from her current hospital stay and outpatient follow-up would be more appropriate for discussion about treatment of lung cancer.  Dr. Marguerita Shih is a lung cancer specialist and I will create a referral so that she can be followed and seen in the clinic  Both the patient and her husband expressed understanding No charge to the visit

## 2024-01-13 NOTE — Progress Notes (Addendum)
 NAME:  Kelly Gibson, MRN:  [id number], DOB:  1960/12/13, LOS: 1 ADMISSION DATE:  01/12/2024, CONSULTATION DATE:  01/13/24 REFERRING MD:  Sulema Endo, CHIEF COMPLAINT:  shortness of breath   History of Present Illness:  Kelly Gibson is a 63 y.o. F with PMH significant for CAD and recent  NSTEMI with recent LHC showing three vessel disease with chronic total occlusions and collaterals, pt has been undecided on whether to pursue intervention, pt also underwent bronchoscopy with Dr. Baldwin Levee for persistent LLL infiltrate with cytology positive for NSCLC and has outpatient follow up scheduled.  She presented 6/18 with acutely worsening shortness of breath and required 4L [place] in the ED.  CXR with likely pulmonary edema.  Labs with mild leukocytosis and BNP consistent with recent baseline at 667, troponin 40.  She is admitted to [facility] and pulmonary asked to consult in light of her recent diagnosis  Pertinent  Medical History   has a past medical history of Bradycardia, Chronic kidney disease, COPD (chronic obstructive pulmonary disease) (HCC), Depression, Depression with anxiety (08/29/2021), Diabetes mellitus, Dysarthria, post-stroke, Dysrhythmia, Ganglion cyst (03/09/2012), History of kidney stones, Hyperlipidemia, Hypertension, Hypokalemia, Myocardial infarction (HCC), Obesity, unspecified (12/17/2011), Pneumonia, Pneumonia of left lower lobe due to infectious organism (12/12/2023), PONV (postoperative nausea and vomiting), Skin lesion of face (11/17/2022), and Stroke (HCC).   Significant Hospital Events: Including procedures, antibiotic start and stop dates in addition to other pertinent events     Interim History / Subjective:  Improving on diuresis She will need oncology follow-up Anticoagulation for PE  Objective    Blood pressure (!) 119/47, pulse (!) 46, temperature 97.9 F (36.6 C), temperature source Oral, resp. rate 20, weight 86.7 kg, last menstrual period 12/10/2012, SpO2 95%.         Intake/Output Summary (Last 24 hours) at 01/13/2024 1222 Last data filed at 01/13/2024 1034 Gross per 24 hour  Intake 697.33 ml  Output 3350 ml  Net -2652.67 ml   Filed Weights   01/12/24 1509  Weight: 86.7 kg    Examination: Awake but tearful.  Her husband says she cannot remember short-term details She became emotional when I mentioned her cancer diagnosis. Oxygen concentrations are being decreased Decreased breath sounds in the bases Heart sounds are regular Abdomen obese soft nontender Remedies with 2+ edema  Labs/Imaging left lower lobe pulmonary embolus  Resolved problem list   Assessment and Plan  Acute hypoxic respiratory failure Acute on chronic heart failure in setting of coronary artery disease and recent NSTEMI History of COPD with new diagnosis of left lower lobe adenocarcinoma CT scan was positive for PE now she is on heparin  She also has a diagnosis of lung cancer but has problems short-term memory and cannot not recall what was told her in the past. Oxygen as needed Outpatient oncology follow-up She will need a PET scan  Best Practice (right click and Reselect all SmartList Selections daily)   Per primary team  Signature:   Siegfried Dress Minor ACNP Acute Care Nurse Practitioner Jon[facility] Pulmonary/Critical Care Please consult Amion 01/13/2024, 12:23 PM   ----------------------  Attending note: I have seen and examined the patient. History, labs and imaging reviewed.  63 year old with history of coronary artery disease, left lower lobe mass with recent diagnosis of adenocarcinoma presenting with acute hypoxic respiratory failure, small segmental left lower lobe PE.  Appears comfortable.  She is still digesting the news of her new diagnosis of cancer.  Blood pressure (!) 119/47, pulse (!) 46, temperature 97.9 F (36.6  C), temperature source Oral, resp. rate 20, height 5' 3 (1.6 m), weight 86.7 kg, last menstrual period 12/10/2012, SpO2 95%. Gen:       No acute distress HEENT:  EOMI, sclera anicteric Neck:     No masses; no thyromegaly Lungs:    Clear to auscultation bilaterally; normal respiratory effort CV:         Regular rate and rhythm; no murmurs Abd:      + bowel sounds; soft, non-tender; no palpable masses, no distension Ext:    No edema; adequate peripheral perfusion Neuro: alert and oriented x 3 Psych: normal mood and affect   Labs/Imaging personally reviewed, significant for BUN/creatinine 29/1.26 WBC 12.2, hemoglobin 10.6  CTA with left lower lobe segmental pulmonary embolism, persistent left lower lobe consolidation.  I have reviewed the images personally.  Assessment/plan: Acute hypoxic respiratory failure New diagnosis of left lower lobe adenocarcinoma Pulmonary embolism Continue heparin .  She does not need IR intervention or lytics at this point.  Can transition to DOAC for long-term anticoagulation Wean down oxygen as tolerated Will need oncology consult and staging as an outpatient.  PCCM will be available as needed.  Please call with any questions.  Raney Antwine MD [facility] 01/13/2024, 1:52 PM

## 2024-01-13 NOTE — Assessment & Plan Note (Addendum)
 01-13-2024 continue with semglee  and SSI.  01-14-2024 stable.

## 2024-01-13 NOTE — Progress Notes (Signed)
 PT Cancellation Note  Patient Details Name: Kelly Gibson MRN: [id number] DOB: 23-Aug-1960   Cancelled Treatment:    Reason Eval/Treat Not Completed: Patient not medically ready  HR in the 40s. Her RN is busy caring for another pt (on phone with MD) and asked another RN to check on pt due to low HR. Will hold PT as HR<50 bpm and primary RN concerned with low HR.    [name], PT Acute Rehabilitation Services  Office [phone]   [name] 01/13/2024, 4:03 PM

## 2024-01-13 NOTE — Assessment & Plan Note (Addendum)
 01-13-2024 continue crestor  40 mg daily  01-14-2024 stable.

## 2024-01-13 NOTE — Assessment & Plan Note (Addendum)
 01-13-2024 diuresed > 3 liters with 2 doses of IV lasix . Change to po lasix  20 mg daily. Echo today shows normal LVEF of 60-65%.  01-14-2024 resolved.

## 2024-01-13 NOTE — Subjective & Objective (Addendum)
 Pt seen and examined. Met with pt and husband howard at bedside. Pt aware of PE. They are both aware of pt's diagnosis of lung cancer. They found out about biopsy results after they were discharged on 01-04-2024.

## 2024-01-13 NOTE — Assessment & Plan Note (Addendum)
 01-13-2024 not exacerbated. Continue with inhalers.  01-14-2024 stable.

## 2024-01-13 NOTE — Hospital Course (Signed)
 HPI: Kelly Gibson is a 63 y.o. female with medical history significant of CAD (3 vessel disease but CABG deferred per pt), AV block, COPD, HTN, HLD, DM2, CVA with residual hemiparesis and dysphagia, CKD3 c/b L renal staghorn calculi s/p L nephrostomy tube p/w recurrent SOB iso ADHF.   Pt states that she was in her USOH until this morning when she awoke at 0200 acutely SOB at rest for which she took a puff of her inhaler and her breathing stabilized enough for her to go back to sleep. Per her husband at bedside, she woke up again with SOB at rest around 0600 and tried her inhaler again to no avail; as such, they activated EMS and BIBA to [facility] ED. Pt cooks 5/7 meals per week and does NOT follow a salt-free diet.    Of note, pt was admitted a week ago on 6/10 and found to have E. Coli sepsis iso L renal staghorn calculi s/p L nephrostomy tube placement for which she received IV abx, and 3 vessel CAD (3 total occlusions with collaterals) for which CABG deferred given her ongoing infection at that time. Most importantly pt was seen to have LLL infiltrate and bronch biopsies from 6/9 demonstrate NSCLC.   In the ED, pt tachypneic on 4L [place]. Labs notable for Cr 1.3, BNP 667, and WBC 11.8. Pt admitted to medicine for ongoing care.  Significant Events: Admitted 01/12/2024 acute diastolic CHF   Admission Labs: VBG pH 7.372, PCO2 39, PO2 79 Na 140, K 4.0, CO2 of 20, BUN 21, Scr 1.30, glu 171 WBC 11.8, HgB 10.1, plt 314 BNP 667 Covid/flu/rsv negative  Admission Imaging Studies: CXR  Low volume film with vascular congestion and diffuse interstitial opacities suggesting edema. 2. Retrocardiac left base atelectasis/infiltrate CTPA Positive for left lower lobe segmental pulmonary emboli. 2. Persistent dense left lower lobe consolidation with small left pleural effusion. 3. New mild scattered ground-glass opacities in both upper lobes. 4. Cholelithiasis. 5. Left nephrolithiasis. 6.  Aortic  Atherosclerosis  Significant Labs:   Significant Imaging Studies:   Antibiotic Therapy: Anti-infectives (From admission, onward)    Start     Dose/Rate Route Frequency Ordered Stop   01/12/24 1030  cefTRIAXone  (ROCEPHIN ) 1 g in sodium chloride  0.9 % 100 mL IVPB        1 g 200 mL/hr over 30 Minutes Intravenous  Once 01/12/24 1022 01/12/24 1317   01/12/24 1030  azithromycin  (ZITHROMAX ) 500 mg in sodium chloride  0.9 % 250 mL IVPB        500 mg 250 mL/hr over 60 Minutes Intravenous  Once 01/12/24 1022 01/12/24 1522       Procedures:   Consultants: PCCM

## 2024-01-13 NOTE — Assessment & Plan Note (Addendum)
 01-13-2024 pt currently on RA now. Due to acute on chronic diastolic CHF. Pt not normally on home O2.  01-14-2024 resolved. On RA.

## 2024-01-13 NOTE — Progress Notes (Signed)
 Mobility Specialist Progress Note;   01/13/24 0929  Mobility  Activity Transferred from bed to chair  Level of Assistance +2 (takes two people) Audiological scientist)  Assistive Device Other (Comment) (HHA)  Distance Ambulated (ft) 3 ft  Activity Response Tolerated well  Mobility Referral Yes  Mobility visit 1 Mobility  Mobility Specialist Start Time (ACUTE ONLY) [id number]  Mobility Specialist Stop Time (ACUTE ONLY) 0940  Mobility Specialist Time Calculation (min) (ACUTE ONLY) 11 min   Pt agreeable to mobility. Required MinA +2 (husband assisted) via HHA to safely transfer pt from bed to chair. VSS on 4LO2. No c/o when asked. Pt left in chair with all needs met, call bell in reach.   Janit Meline Mobility Specialist Please contact via SecureChat or [employer] [phone]

## 2024-01-13 NOTE — Telephone Encounter (Signed)
 Pharmacy Patient Advocate Encounter  Insurance verification completed.    The patient is insured through [facility]. Patient has Medicare and is not eligible for a copay card, but may be able to apply for patient assistance or Medicare RX Payment Plan (Patient Must reach out to their plan, if eligible for payment plan), if available.    Ran test claim for Eliquis 5mg  tablets and the current 30 day co-pay is $121.94. Copay applied to the deductible. $47.00 after the deductible.    This test claim was processed through [facility]- copay amounts may vary at other pharmacies due to pharmacy/plan contracts, or as the patient moves through the different stages of their insurance plan.

## 2024-01-13 NOTE — Assessment & Plan Note (Signed)
 01-13-2024 has had CKD since 2020.  Baseline Scr 1.2-1.5  01-14-2024 stable.

## 2024-01-13 NOTE — Assessment & Plan Note (Addendum)
 01-13-2024 pt with obstructive disease based on LHC from 12-28-2023. Pt declined CABG. No chest pain at this time.  01-14-2024 f/u with cardiothoracic surgery as scheduled. Pt currently not having any resting angina. Remains on ASA and crestor . No beta blockers due to resting HR in the high 40s and low 50s.

## 2024-01-13 NOTE — Plan of Care (Signed)

## 2024-01-13 NOTE — Discharge Instructions (Addendum)
 Information on my medicine - ELIQUIS (apixaban)  This medication education was reviewed with me or my healthcare representative as part of my discharge preparation.  The pharmacist that spoke with me during my hospital stay was:  Corrin Dimes, [facility]  Why was Eliquis prescribed for you? Eliquis was prescribed to treat blood clots that may have been found in the veins of your legs (deep vein thrombosis) or in your lungs (pulmonary embolism) and to reduce the risk of them occurring again.  What do You need to know about Eliquis ? The starting dose is 10 mg (two 5 mg tablets) taken TWICE daily for the FIRST SEVEN (7) DAYS, then on  01/20/24  the dose is reduced to ONE 5 mg tablet taken TWICE daily.  Eliquis may be taken with or without food.   Try to take the dose about the same time in the morning and in the evening. If you have difficulty swallowing the tablet whole please discuss with your pharmacist how to take the medication safely.  Take Eliquis exactly as prescribed and DO NOT stop taking Eliquis without talking to the doctor who prescribed the medication.  Stopping may increase your risk of developing a new blood clot.  Refill your prescription before you run out.  After discharge, you should have regular check-up appointments with your healthcare provider that is prescribing your Eliquis.    What do you do if you miss a dose? If a dose of ELIQUIS is not taken at the scheduled time, take it as soon as possible on the same day and twice-daily administration should be resumed. The dose should not be doubled to make up for a missed dose.  Important Safety Information A possible side effect of Eliquis is bleeding. You should call your healthcare provider right away if you experience any of the following: Bleeding from an injury or your nose that does not stop. Unusual colored urine (red or dark brown) or unusual colored stools (red or black). Unusual bruising for unknown reasons. A  serious fall or if you hit your head (even if there is no bleeding).  Some medicines may interact with Eliquis and might increase your risk of bleeding or clotting while on Eliquis. To help avoid this, consult your healthcare provider or pharmacist prior to using any new prescription or non-prescription medications, including herbals, vitamins, non-steroidal anti-inflammatory drugs (NSAIDs) and supplements.  This website has more information on Eliquis (apixaban): http://www.eliquis.com/eliquis/home  ======================================  Pulmonary Embolism    A pulmonary embolism (PE) is a sudden blockage or decrease of blood flow in one or both lungs. Most blockages come from a blood clot that forms in the vein of a lower leg, thigh, or arm (deep vein thrombosis, DVT) and travels to the lungs. A clot is blood that has thickened into a gel or solid. PE is a dangerous and life-threatening condition that needs to be treated right away.  What are the causes? This condition is usually caused by a blood clot that forms in a vein and moves to the lungs. In rare cases, it may be caused by air, fat, part of a tumor, or other tissue that moves through the veins and into the lungs.  What increases the risk? The following factors may make you more likely to develop this condition: Experiencing a traumatic injury, such as breaking a hip or leg. Having: A spinal cord injury. Orthopedic surgery, especially hip or knee replacement. Any major surgery. A stroke. DVT. Blood clots or blood clotting  disease. Long-term (chronic) lung or heart disease. Cancer treated with chemotherapy. A central venous catheter. Taking medicines that contain estrogen. These include birth control pills and hormone replacement therapy. Being: Pregnant. In the period of time after your baby is delivered (postpartum). Older than age 27. Overweight. A smoker, especially if you have other risks.  What are the signs or  symptoms? Symptoms of this condition usually start suddenly and include: Shortness of breath during activity or at rest. Coughing, coughing up blood, or coughing up blood-tinged mucus. Chest pain that is often worse with deep breaths. Rapid or irregular heartbeat. Feeling light-headed or dizzy. Fainting. Feeling anxious. Fever. Sweating. Pain and swelling in a leg. This is a symptom of DVT, which can lead to PE. How is this diagnosed? This condition may be diagnosed based on: Your medical history. A physical exam. Blood tests. CT pulmonary angiogram. This test checks blood flow in and around your lungs. Ventilation-perfusion scan, also called a lung VQ scan. This test measures air flow and blood flow to the lungs. An ultrasound of the legs.  How is this treated? Treatment for this condition depends on many factors, such as the cause of your PE, your risk for bleeding or developing more clots, and other medical conditions you have. Treatment aims to remove, dissolve, or stop blood clots from forming or growing larger. Treatment may include: Medicines, such as: Blood thinning medicines (anticoagulants) to stop clots from forming. Medicines that dissolve clots (thrombolytics). Procedures, such as: Using a flexible tube to remove a blood clot (embolectomy) or to deliver medicine to destroy it (catheter-directed thrombolysis). Inserting a filter into a large vein that carries blood to the heart (inferior vena cava). This filter (vena cava filter) catches blood clots before they reach the lungs. Surgery to remove the clot (surgical embolectomy). This is rare. You may need a combination of immediate, long-term (up to 3 months after diagnosis), and extended (more than 3 months after diagnosis) treatments. Your treatment may continue for several months (maintenance therapy). You and your health care provider will work together to choose the treatment program that is best for you.  Follow these  instructions at home: Medicines Take over-the-counter and prescription medicines only as told by your health care provider. If you are taking an anticoagulant medicine: Take the medicine every day at the same time each day. Understand what foods and drugs interact with your medicine. Understand the side effects of this medicine, including excessive bruising or bleeding. Ask your health care provider or pharmacist about other side effects.  General instructions Wear a medical alert bracelet or carry a medical alert card that says you have had a PE and lists what medicines you take. Ask your health care provider when you may return to your normal activities. Avoid sitting or lying for a long time without moving. Maintain a healthy weight. Ask your health care provider what weight is healthy for you. Do not use any products that contain nicotine  or tobacco, such as cigarettes, e-cigarettes, and chewing tobacco. If you need help quitting, ask your health care provider. Talk with your health care provider about any travel plans. It is important to make sure that you are still able to take your medicine while on trips. Keep all follow-up visits as told by your health care provider. This is important.  Contact a health care provider if: You missed a dose of your blood thinner medicine.  Get help right away if: You have: New or increased pain, swelling, warmth, or  redness in an arm or leg. Numbness or tingling in an arm or leg. Shortness of breath during activity or at rest. A fever. Chest pain. A rapid or irregular heartbeat. A severe headache. Vision changes. A serious fall or accident, or you hit your head. Stomach (abdominal) pain. Blood in your vomit, stool, or urine. A cut that will not stop bleeding. You cough up blood. You feel light-headed or dizzy. You cannot move your arms or legs. You are confused or have memory loss.  These symptoms may represent a serious problem that is  an emergency. Do not wait to see if the symptoms will go away. Get medical help right away. Call your local emergency services (911 in the [place] Do not drive yourself to the hospital. Summary A pulmonary embolism (PE) is a sudden blockage or decrease of blood flow in one or both lungs. PE is a dangerous and life-threatening condition that needs to be treated right away. Treatments for this condition usually include medicines to thin your blood (anticoagulants) or medicines to break apart blood clots (thrombolytics). If you are given blood thinners, it is important to take the medicine every day at the same time each day. Understand what foods and drugs interact with any medicines that you are taking. If you have signs of PE or DVT, call your local emergency services (911 in the [place] This information is not intended to replace advice given to you by your health care provider. Make sure you discuss any questions you have with your health care provider. Document Revised: 04/20/2018 Document Reviewed: 04/20/2018 [employer]vier Patient Education  2020 [employer].

## 2024-01-13 NOTE — Plan of Care (Signed)
 Discussed with patient pain management, plan of care and medications for this evening with some teach back displayed.   Problem: Education: Goal: Ability to describe self-care measures that may prevent or decrease complications (Diabetes Survival Skills Education) will improve Outcome: Progressing   Problem: Health Behavior/Discharge Planning: Goal: Ability to manage health-related needs will improve Outcome: Progressing   Problem: Pain Managment: Goal: General experience of comfort will improve and/or be controlled Outcome: Progressing

## 2024-01-13 NOTE — Progress Notes (Signed)
 PROGRESS NOTE    Kelly Gibson  NWG:[id number] DOB: 1960/08/20 DOA: 01/12/2024 PCP: Annella Kief, NP  Subjective: Pt seen and examined. Met with pt and husband howard at bedside. Pt aware of PE. They are both aware of pt's diagnosis of lung cancer. They found out about biopsy results after they were discharged on 01-04-2024.   Hospital Course: HPI: Kelly Gibson is a 63 y.o. female with medical history significant of CAD (3 vessel disease but CABG deferred per pt), AV block, COPD, HTN, HLD, DM2, CVA with residual hemiparesis and dysphagia, CKD3 c/b L renal staghorn calculi s/p L nephrostomy tube p/w recurrent SOB iso ADHF.   Pt states that she was in her USOH until this morning when she awoke at 0200 acutely SOB at rest for which she took a puff of her inhaler and her breathing stabilized enough for her to go back to sleep. Per her husband at bedside, she woke up again with SOB at rest around 0600 and tried her inhaler again to no avail; as such, they activated EMS and BIBA to [facility] ED. Pt cooks 5/7 meals per week and does NOT follow a salt-free diet.    Of note, pt was admitted a week ago on 6/10 and found to have E. Coli sepsis iso L renal staghorn calculi s/p L nephrostomy tube placement for which she received IV abx, and 3 vessel CAD (3 total occlusions with collaterals) for which CABG deferred given her ongoing infection at that time. Most importantly pt was seen to have LLL infiltrate and bronch biopsies from 6/9 demonstrate NSCLC.   In the ED, pt tachypneic on 4L [place]. Labs notable for Cr 1.3, BNP 667, and WBC 11.8. Pt admitted to medicine for ongoing care.  Significant Events: Admitted 01/12/2024 acute diastolic CHF   Admission Labs: VBG pH 7.372, PCO2 39, PO2 79 Na 140, K 4.0, CO2 of 20, BUN 21, Scr 1.30, glu 171 WBC 11.8, HgB 10.1, plt 314 BNP 667 Covid/flu/rsv negative  Admission Imaging Studies: CXR  Low volume film with vascular congestion and diffuse interstitial  opacities suggesting edema. 2. Retrocardiac left base atelectasis/infiltrate CTPA Positive for left lower lobe segmental pulmonary emboli. 2. Persistent dense left lower lobe consolidation with small left pleural effusion. 3. New mild scattered ground-glass opacities in both upper lobes. 4. Cholelithiasis. 5. Left nephrolithiasis. 6.  Aortic Atherosclerosis  Significant Labs:   Significant Imaging Studies:   Antibiotic Therapy: Anti-infectives (From admission, onward)    Start     Dose/Rate Route Frequency Ordered Stop   01/12/24 1030  cefTRIAXone  (ROCEPHIN ) 1 g in sodium chloride  0.9 % 100 mL IVPB        1 g 200 mL/hr over 30 Minutes Intravenous  Once 01/12/24 1022 01/12/24 1317   01/12/24 1030  azithromycin  (ZITHROMAX ) 500 mg in sodium chloride  0.9 % 250 mL IVPB        500 mg 250 mL/hr over 60 Minutes Intravenous  Once 01/12/24 1022 01/12/24 1522       Procedures:   Consultants: PCCM    Assessment and Plan: * Acute hypoxemic respiratory failure (HCC) 01-13-2024 pt currently on RA now. Due to acute on chronic diastolic CHF. Pt not normally on home O2.  Acute pulmonary embolism without acute cor pulmonale (HCC) 01-13-2024 stop IV heparin . Start Eliquis. Heme/onc consulted to give input into duration of therapy. Pt without prior hx of VTE. But she now has lung cancer. ??indefinite need for systemic anticoagulation?? Hopefully, heme/onc can help answer  this question.  Lung cancer (HCC) 01-13-2024 biopsy from bronch this month shows non-small cell lung cancer. Biopsy report came back after she was discharged. Will consult heme/onc.  Acute on chronic diastolic heart failure (HCC) 01-13-2024 diuresed > 3 liters with 2 doses of IV lasix . Change to po lasix  20 mg daily. Echo today shows normal LVEF of 60-65%.  DM (diabetes mellitus), type 2 with renal complications (HCC) 01-13-2024 continue with semglee  and SSI.  CAD (coronary artery disease), native coronary artery 01-13-2024  pt with obstructive disease based on LHC from 12-28-2023. Pt declined CABG. No chest pain at this time.  Essential hypertension 01-13-2024 continue avapro  Hyperlipidemia associated with type 2 diabetes mellitus (HCC) 01-13-2024 continue crestor  40 mg daily  CKD stage 3a, GFR 45-59 ml/min (HCC) - Baseline Scr 1.2-1.5 01-13-2024 has had CKD since 2020.  Baseline Scr 1.2-1.5  Hemiparesis affecting right side as late effect of cerebrovascular accident (CVA) (HCC) 01-13-2024 chronic.  COPD (chronic obstructive pulmonary disease) (HCC) 01-13-2024 not exacerbated. Continue with inhalers.  DVT prophylaxis:  apixaban (ELIQUIS) tablet 10 mg  apixaban (ELIQUIS) tablet 5 mg     Code Status: Full Code Family Communication: discussed at bedside with husband Kelly Gibson Disposition Plan: return home Reason for continuing need for hospitalization: changing to po eliquis. Stopping IV heparin . Heme/onc consulted.  Objective: Vitals:   01/13/24 0400 01/13/24 0740 01/13/24 1126 01/13/24 1245  BP: (!) 151/56 (!) 141/64 (!) 119/47   Pulse: (!) 43  (!) 46   Resp: 20     Temp: 98.3 F (36.8 C) 97.7 F (36.5 C) 97.9 F (36.6 C)   TempSrc: Oral Oral Oral   SpO2: 100%  95%   Weight:      Height:    5' 3 (1.6 m)    Intake/Output Summary (Last 24 hours) at 01/13/2024 1415 Last data filed at 01/13/2024 1239 Gross per 24 hour  Intake 937.33 ml  Output 3350 ml  Net -2412.67 ml   Filed Weights   01/12/24 1509  Weight: 86.7 kg    Examination:  Physical Exam Vitals and nursing note reviewed.  Constitutional:      General: She is not in acute distress.    Appearance: She is obese. She is not toxic-appearing.  HENT:     Head: Normocephalic and atraumatic.   Cardiovascular:     Rate and Rhythm: Normal rate and regular rhythm.  Pulmonary:     Effort: Pulmonary effort is normal. No respiratory distress.     Breath sounds: Normal breath sounds.  Abdominal:     General: There is no distension.      Tenderness: There is no abdominal tenderness.   Musculoskeletal:     Right lower leg: No edema.     Left lower leg: No edema.   Skin:    General: Skin is warm and dry.     Capillary Refill: Capillary refill takes less than 2 seconds.   Neurological:     General: No focal deficit present.     Mental Status: She is alert and oriented to person, place, and time.     Data Reviewed: I have personally reviewed following labs and imaging studies  CBC: Recent Labs  Lab 01/10/24 1508 01/12/24 0748 01/12/24 0812 01/12/24 1427 01/13/24 0736  WBC 6.8 11.8*  --  9.8 12.2*  NEUTROABS 4.3 8.8*  --   --   --   HGB 11.5 10.1* 10.5* 9.8* 10.6*  HCT 36.6 34.9* 31.0* 32.6* 35.5*  MCV 88  93.3  --  91.6 89.6  PLT 343 314  --  286 271   Basic Metabolic Panel: Recent Labs  Lab 01/10/24 1508 01/12/24 0748 01/12/24 0812 01/12/24 1427 01/13/24 0736  NA 144 140 142  --  140  K 4.9 4.0 3.9  --  4.5  CL 105 106  --   --  102  CO2 23 20*  --   --  24  GLUCOSE 90 171*  --   --  175*  BUN 27 21  --   --  29*  CREATININE 1.29* 1.30*  --  1.21* 1.26*  CALCIUM  9.6 8.7*  --   --  9.0  MG 2.1  --   --   --   --    GFR: Estimated Creatinine Clearance: 48.9 mL/min (A) (by C-G formula based on SCr of 1.26 mg/dL (H)). Liver Function Tests: Recent Labs  Lab 01/10/24 1508  AST 25  ALT 20  ALKPHOS 89  BILITOT 0.2  PROT 6.8  ALBUMIN 3.8*   BNP (last 3 results) Recent Labs    12/30/23 1400 01/12/24 0748  BNP 691.1* 667.3*   HbA1C: Recent Labs    01/10/24 1508  HGBA1C 7.3*   CBG: Recent Labs  Lab 01/12/24 2105 01/12/24 2303 01/13/24 0623 01/13/24 1124  GLUCAP 415* 259* 191* 302*   Lipid Profile: Recent Labs    01/10/24 1508  CHOL 159  HDL 62  LDLCALC 71  TRIG 157*  LDLDIRECT 70   Anemia Panel: Recent Labs    01/10/24 1508  FERRITIN 111  TIBC 302  IRON 37   Recent Results (from the past 240 hours)  Fungus Culture With Stain     Status: None (Preliminary  result)   Collection Time: 01/03/24  2:51 PM   Specimen: Bronchial Alveolar Lavage; Respiratory  Result Value Ref Range Status   Fungus Stain Final report  Final    Comment: (NOTE) Performed At: [facility] [address] [place], [place] [id number] Pearlean Botts MD NW:[id number]    Fungus (Mycology) Culture PENDING  Incomplete   Fungal Source BRONCHIAL ALVEOLAR LAVAGE  Final    Comment: LLL Performed at [facility], [address] [place], [place] [address]   Culture, BAL-quantitative w Gram Stain     Status: None   Collection Time: 01/03/24  2:51 PM   Specimen: Bronchial Alveolar Lavage; Respiratory  Result Value Ref Range Status   Specimen Description BRONCHIAL ALVEOLAR LAVAGE  Final   Special Requests LLL  Final   Gram Stain   Final    RARE WBC PRESENT, PREDOMINANTLY PMN NO ORGANISMS SEEN    Culture   Final    NO GROWTH 2 DAYS Performed at [facility], [address] [place], [place] [address]    Report Status 01/06/2024 FINAL  Final  Acid Fast Smear (AFB)     Status: None   Collection Time: 01/03/24  2:51 PM   Specimen: Bronchial Alveolar Lavage; Respiratory  Result Value Ref Range Status   AFB Specimen Processing Concentration  Final   Acid Fast Smear Negative  Final    Comment: (NOTE) Performed At: [facility] [address] [place], [place] [id number] Pearlean Botts MD NU:[id number]    Source (AFB) BRONCHIAL ALVEOLAR LAVAGE  Final    Comment: LLL Performed at [facility], [address] [place], [place] [address]   Anaerobic culture w Gram Stain     Status: None   Collection  Time: 01/03/24  2:51 PM   Specimen: Bronchoalveolar Lavage  Result Value Ref Range Status   Specimen Description BRONCHIAL ALVEOLAR LAVAGE  Final   Special Requests LLL  Final   Gram Stain   Final    RARE WBC PRESENT, PREDOMINANTLY PMN NO ORGANISMS SEEN    Culture   Final    NO ANAEROBES ISOLATED Performed at [facility], [address] [place], [place] [address]    Report Status 01/08/2024 FINAL  Final  Fungus Culture Result     Status: None   Collection Time: 01/03/24  2:51 PM  Result Value Ref Range Status   Result 1 Comment  Final    Comment: (NOTE) KOH/Calcofluor preparation:  no fungus observed. Performed At: [facility] [address] [place], [place] [id number] Pearlean Botts MD XB:[id number]   Resp panel by RT-PCR (RSV, Flu A&B, Covid) Anterior Nasal Swab     Status: None   Collection Time: 01/12/24  7:48 AM   Specimen: Anterior Nasal Swab  Result Value Ref Range Status   SARS Coronavirus 2 by RT PCR NEGATIVE NEGATIVE Final   Influenza A by PCR NEGATIVE NEGATIVE Final   Influenza B by PCR NEGATIVE NEGATIVE Final    Comment: (NOTE) The Xpert Xpress SARS-CoV-2/FLU/RSV plus assay is intended as an aid in the diagnosis of influenza from Nasopharyngeal swab specimens and should not be used as a sole basis for treatment. Nasal washings and aspirates are unacceptable for Xpert Xpress SARS-CoV-2/FLU/RSV testing.  Fact Sheet for Patients: BloggerCourse.com  Fact Sheet for Healthcare Providers: SeriousBroker.it  This test is not yet approved or cleared by the [place]  FDA and has been authorized for detection and/or diagnosis of SARS-CoV-2 by FDA under an Emergency Use Authorization (EUA). This EUA will remain in effect (meaning this test can be used) for the duration of the COVID-19 declaration under Section 564(b)(1) of the Act, 21 U.S.C. section 360bbb-3(b)(1), unless the authorization is terminated or revoked.     Resp Syncytial Virus by PCR NEGATIVE NEGATIVE Final    Comment: (NOTE) Fact Sheet for Patients: BloggerCourse.com  Fact Sheet for Healthcare Providers: SeriousBroker.it  This test is not yet approved or cleared by the [place]  FDA and has been authorized for  detection and/or diagnosis of SARS-CoV-2 by FDA under an Emergency Use Authorization (EUA). This EUA will remain in effect (meaning this test can be used) for the duration of the COVID-19 declaration under Section 564(b)(1) of the Act, 21 U.S.C. section 360bbb-3(b)(1), unless the authorization is terminated or revoked.  Performed at [facility], [address] [place], [place] [address]   Culture, blood (Routine X 2) w Reflex to ID Panel     Status: None (Preliminary result)   Collection Time: 01/12/24  3:38 PM   Specimen: BLOOD  Result Value Ref Range Status   Specimen Description BLOOD LEFT ANTECUBITAL  Final   Special Requests   Final    BOTTLES DRAWN AEROBIC ONLY Blood Culture results may not be optimal due to an inadequate volume of blood received in culture bottles   Culture   Final    NO GROWTH < 24 HOURS Performed at [facility], [address], [place], [place] [address]    Report Status PENDING  Incomplete  Culture, blood (Routine X 2) w Reflex to ID Panel     Status: None (Preliminary result)   Collection Time: 01/12/24  3:39 PM   Specimen: BLOOD LEFT HAND  Result  Value Ref Range Status   Specimen Description BLOOD LEFT HAND  Final   Special Requests   Final    BOTTLES DRAWN AEROBIC ONLY Blood Culture results may not be optimal due to an inadequate volume of blood received in culture bottles   Culture   Final    NO GROWTH < 24 HOURS Performed at [facility], [address] [place], [place] [address]    Report Status PENDING  Incomplete  MRSA Next Gen by PCR, Nasal     Status: None   Collection Time: 01/12/24  9:03 PM   Specimen: Nasal Mucosa; Nasal Swab  Result Value Ref Range Status   MRSA by PCR Next Gen NOT DETECTED NOT DETECTED Final    Comment: (NOTE) The GeneXpert MRSA Assay (FDA approved for NASAL specimens only), is one component of a comprehensive MRSA colonization surveillance program. It is not intended to diagnose MRSA infection  nor to guide or monitor treatment for MRSA infections. Test performance is not FDA approved in patients less than 58 years old. Performed at [facility], [address] [place], [place] [address]     Radiology Studies: ECHOCARDIOGRAM COMPLETE BUBBLE STUDY Result Date: 01/13/2024    ECHOCARDIOGRAM REPORT   Patient Name:   Kelly Gibson Date of Exam: 01/13/2024 Medical Rec #:  [id number]         Height:       63.0 in Accession #:    [id number]        Weight:       191.1 lb Date of Birth:  03-19-1961        BSA:          1.897 m Patient Age:    62 years          BP:           151/56 mmHg Patient Gender: F                 HR:           54 bpm. Exam Location:  Inpatient Procedure: 2D Echo, Saline Contrast Bubble Study, Cardiac Doppler, Color Doppler            and Strain Analysis (Both Spectral and Color Flow Doppler were            utilized during procedure). Indications:    CVA  History:        Patient has prior history of Echocardiogram examinations, most                 recent 10/27/2017.  Sonographer:    Griselda Lederer Referring Phys: [id number] Arne Langdon IMPRESSIONS  1. Left ventricular ejection fraction, by estimation, is 60 to 65%. The left ventricle has normal function. The left ventricle has no regional wall motion abnormalities. There is mild concentric left ventricular hypertrophy. Left ventricular diastolic parameters are consistent with Grade II diastolic dysfunction (pseudonormalization). The average left ventricular global longitudinal strain is -9.5 %. The global longitudinal strain is abnormal.  2. Right ventricular systolic function is normal. The right ventricular size is normal.  3. Left atrial size was moderately dilated.  4. The mitral valve is normal in structure. Mild mitral valve regurgitation. No evidence of mitral stenosis.  5. The aortic valve is normal in structure. There is moderate calcification of the aortic valve. Aortic valve regurgitation is not visualized. No aortic  stenosis is present.  6. The inferior vena cava is normal in size with  greater than 50% respiratory variability, suggesting right atrial pressure of 3 mmHg. FINDINGS  Left Ventricle: Left ventricular ejection fraction, by estimation, is 60 to 65%. The left ventricle has normal function. The left ventricle has no regional wall motion abnormalities. The average left ventricular global longitudinal strain is -9.5 %. Strain was performed and the global longitudinal strain is abnormal. The left ventricular internal cavity size was normal in size. There is mild concentric left ventricular hypertrophy. Left ventricular diastolic parameters are consistent with Grade II diastolic dysfunction (pseudonormalization). Right Ventricle: The right ventricular size is normal. No increase in right ventricular wall thickness. Right ventricular systolic function is normal. Left Atrium: Left atrial size was moderately dilated. Right Atrium: Right atrial size was normal in size. Pericardium: There is no evidence of pericardial effusion. Mitral Valve: The mitral valve is normal in structure. There is mild thickening of the mitral valve leaflet(s). There is mild calcification of the mitral valve leaflet(s). Mild mitral annular calcification. Mild mitral valve regurgitation. No evidence of  mitral valve stenosis. MV peak gradient, 7.8 mmHg. The mean mitral valve gradient is 2.0 mmHg. Tricuspid Valve: The tricuspid valve is normal in structure. Tricuspid valve regurgitation is trivial. No evidence of tricuspid stenosis. Aortic Valve: The aortic valve is normal in structure. There is moderate calcification of the aortic valve. Aortic valve regurgitation is not visualized. No aortic stenosis is present. Aortic valve mean gradient measures 4.0 mmHg. Aortic valve peak gradient measures 7.5 mmHg. Aortic valve area, by VTI measures 2.13 cm. Pulmonic Valve: The pulmonic valve was normal in structure. Pulmonic valve regurgitation is not visualized.  No evidence of pulmonic stenosis. Aorta: The aortic root is normal in size and structure. Venous: The inferior vena cava is normal in size with greater than 50% respiratory variability, suggesting right atrial pressure of 3 mmHg. IAS/Shunts: No atrial level shunt detected by color flow Doppler. Agitated saline contrast was given intravenously to evaluate for intracardiac shunting.  LEFT VENTRICLE PLAX 2D LVIDd:         4.60 cm   Diastology LVIDs:         3.20 cm   LV e' medial:    3.81 cm/s LV PW:         1.10 cm   LV E/e' medial:  33.3 LV IVS:        1.30 cm   LV e' lateral:   4.87 cm/s LVOT diam:     2.00 cm   LV E/e' lateral: 26.1 LV SV:         80 LV SV Index:   42        2D Longitudinal Strain LVOT Area:     3.14 cm  2D Strain GLS Avg:     -9.5 %                           3D Volume EF                          LV 3D EDV:   116.83 ml                          LV 3D ESV:   46.65 ml RIGHT VENTRICLE             IVC RV Basal diam:  4.10 cm     IVC diam: 1.80 cm RV S prime:  10.10 cm/s TAPSE (M-mode): 1.8 cm LEFT ATRIUM             Index LA diam:        4.90 cm 2.58 cm/m LA Vol (A2C):   82.1 ml 43.28 ml/m LA Vol (A4C):   99.6 ml 52.51 ml/m LA Biplane Vol: 94.6 ml 49.87 ml/m  AORTIC VALVE AV Area (Vmax):    2.34 cm AV Area (Vmean):   2.31 cm AV Area (VTI):     2.13 cm AV Vmax:           137.00 cm/s AV Vmean:          92.700 cm/s AV VTI:            0.374 m AV Peak Grad:      7.5 mmHg AV Mean Grad:      4.0 mmHg LVOT Vmax:         102.00 cm/s LVOT Vmean:        68.300 cm/s LVOT VTI:          0.254 m LVOT/AV VTI ratio: 0.68  AORTA Ao Root diam: 2.90 cm Ao Asc diam:  3.40 cm MITRAL VALVE MV Area (PHT): 1.74 cm     SHUNTS MV Area VTI:   2.00 cm     Systemic VTI:  0.25 m MV Peak grad:  7.8 mmHg     Systemic Diam: 2.00 cm MV Mean grad:  2.0 mmHg MV Vmax:       1.40 m/s MV Vmean:      57.7 cm/s MV Decel Time: 437 msec MV E velocity: 127.00 cm/s MV A velocity: 74.50 cm/s MV E/A ratio:  1.70 Aditya Sabharwal  Electronically signed by Alwin Baars Signature Date/Time: 01/13/2024/12:58:14 PM    Final    CT Angio Chest Pulmonary Embolism (PE) W or WO Contrast Result Date: 01/12/2024 CLINICAL DATA:  Pulmonary embolism (PE) suspected, high prob EXAM: CT ANGIOGRAPHY CHEST WITH CONTRAST TECHNIQUE: Multidetector CT imaging of the chest was performed using the standard protocol during bolus administration of intravenous contrast. Multiplanar CT image reconstructions and MIPs were obtained to evaluate the vascular anatomy. RADIATION DOSE REDUCTION: This exam was performed according to the departmental dose-optimization program which includes automated exposure control, adjustment of the mA and/or kV according to patient size and/or use of iterative reconstruction technique. CONTRAST:  60mL OMNIPAQUE  IOHEXOL  350 MG/ML SOLN COMPARISON:  12/31/2023 and previous FINDINGS: Cardiovascular: Heart size normal. No pericardial effusion. Good contrast opacification of the pulmonary arterial tree. Segmental filling defects in 2 left lower lobe branches. No right-sided pulmonary emboli. Heavy coronary arterial calcifications. Incomplete contrast opacification of the thoracic aorta, with no evidence of aneurysm. Scattered calcified plaque in the arch and descending thoracic aorta. Mediastinum/Nodes: No mediastinal mass or hematoma. Stable borderline enlarged right paratracheal node. Subcentimeter anterior mediastinal nodes. No definite hilar adenopathy. Lungs/Pleura: Persistent dense consolidation in the left lower lobe. Small left pleural effusion, slightly increased. Mild scattered ground-glass opacities in both upper lobes with somewhat geographic distribution, new since previous. Upper Abdomen: Subcentimeter calcified gallstone in the dependent aspect of the nondilated gallbladder. 4 mm calculus in the upper pole left renal collecting system, incompletely visualized. No acute findings. Musculoskeletal:   Right shoulder DJD.  No acute  findings. Review of the MIP images confirms the above findings. IMPRESSION: 1. Positive for left lower lobe segmental pulmonary emboli. 2. Persistent dense left lower lobe consolidation with small left pleural effusion. 3. New mild scattered ground-glass opacities in both upper lobes.  4. Cholelithiasis. 5. Left nephrolithiasis. 6.  Aortic Atherosclerosis (ICD10-I70.0). Electronically Signed   By: Nicoletta Barrier M.D.   On: 01/12/2024 15:10   DG Chest Port 1 View Result Date: 01/12/2024 CLINICAL DATA:  Shortness of breath. EXAM: PORTABLE CHEST 1 VIEW COMPARISON:  01/03/2024 FINDINGS: Low lung volumes. The cardio pericardial silhouette is enlarged. Vascular congestion diffuse interstitial opacities suggesting edema. Retrocardiac left base atelectasis/infiltrate noted. No acute bony abnormality. Telemetry leads overlie the chest. IMPRESSION: 1. Low volume film with vascular congestion and diffuse interstitial opacities suggesting edema. 2. Retrocardiac left base atelectasis/infiltrate. Electronically Signed   By: Donnal Fusi M.D.   On: 01/12/2024 09:32    Scheduled Meds:  apixaban  10 mg Oral BID   Followed by   Cecily Cohen ON 01/20/2024] apixaban  5 mg Oral BID   aspirin  EC  81 mg Oral Daily   budesonide -glycopyrrolate -formoterol   2 puff Inhalation BID   citalopram   10 mg Oral Daily   [START ON 01/14/2024] furosemide   20 mg Oral Daily   insulin  aspart  0-15 Units Subcutaneous TID WC   insulin  aspart  0-5 Units Subcutaneous QHS   insulin  glargine-yfgn  10 Units Subcutaneous Daily   irbesartan  150 mg Oral Daily   rosuvastatin   40 mg Oral Daily   Continuous Infusions:   LOS: 1 day   Time spent: 50 minutes  Unk Garb, DO  [facility]  01/13/2024, 2:15 PM

## 2024-01-13 NOTE — Assessment & Plan Note (Addendum)
 01-13-2024 continue avapro  01-14-2024 stable. Continue with home Benicar.

## 2024-01-13 NOTE — Assessment & Plan Note (Addendum)
 01-13-2024 chronic.  01-14-2024 stable.

## 2024-01-14 ENCOUNTER — Ambulatory Visit: Admitting: Nurse Practitioner

## 2024-01-14 ENCOUNTER — Other Ambulatory Visit (HOSPITAL_COMMUNITY): Payer: Self-pay

## 2024-01-14 DIAGNOSIS — I10 Essential (primary) hypertension: Secondary | ICD-10-CM | POA: Diagnosis not present

## 2024-01-14 DIAGNOSIS — I2699 Other pulmonary embolism without acute cor pulmonale: Secondary | ICD-10-CM | POA: Diagnosis not present

## 2024-01-14 DIAGNOSIS — C3432 Malignant neoplasm of lower lobe, left bronchus or lung: Secondary | ICD-10-CM | POA: Diagnosis not present

## 2024-01-14 DIAGNOSIS — E1165 Type 2 diabetes mellitus with hyperglycemia: Secondary | ICD-10-CM

## 2024-01-14 DIAGNOSIS — J9601 Acute respiratory failure with hypoxia: Secondary | ICD-10-CM | POA: Diagnosis not present

## 2024-01-14 DIAGNOSIS — Z794 Long term (current) use of insulin: Secondary | ICD-10-CM

## 2024-01-14 DIAGNOSIS — E66811 Obesity, class 1: Secondary | ICD-10-CM | POA: Insufficient documentation

## 2024-01-14 LAB — CBC WITH DIFFERENTIAL/PLATELET
Abs Immature Granulocytes: 0.04 10*3/uL (ref 0.00–0.07)
Basophils Absolute: 0.1 10*3/uL (ref 0.0–0.1)
Basophils Relative: 1 %
Eosinophils Absolute: 0.2 10*3/uL (ref 0.0–0.5)
Eosinophils Relative: 2 %
HCT: 34 % — ABNORMAL LOW (ref 36.0–46.0)
Hemoglobin: 10.5 g/dL — ABNORMAL LOW (ref 12.0–15.0)
Immature Granulocytes: 0 %
Lymphocytes Relative: 15 %
Lymphs Abs: 1.6 10*3/uL (ref 0.7–4.0)
MCH: 27 pg (ref 26.0–34.0)
MCHC: 30.9 g/dL (ref 30.0–36.0)
MCV: 87.4 fL (ref 80.0–100.0)
Monocytes Absolute: 0.9 10*3/uL (ref 0.1–1.0)
Monocytes Relative: 8 %
Neutro Abs: 8 10*3/uL — ABNORMAL HIGH (ref 1.7–7.7)
Neutrophils Relative %: 74 %
Platelets: 332 10*3/uL (ref 150–400)
RBC: 3.89 MIL/uL (ref 3.87–5.11)
RDW: 16.2 % — ABNORMAL HIGH (ref 11.5–15.5)
WBC: 10.8 10*3/uL — ABNORMAL HIGH (ref 4.0–10.5)
nRBC: 0 % (ref 0.0–0.2)

## 2024-01-14 LAB — BASIC METABOLIC PANEL WITH GFR
Anion gap: 12 (ref 5–15)
BUN: 32 mg/dL — ABNORMAL HIGH (ref 8–23)
CO2: 24 mmol/L (ref 22–32)
Calcium: 8.8 mg/dL — ABNORMAL LOW (ref 8.9–10.3)
Chloride: 105 mmol/L (ref 98–111)
Creatinine, Ser: 1.25 mg/dL — ABNORMAL HIGH (ref 0.44–1.00)
GFR, Estimated: 49 mL/min — ABNORMAL LOW (ref >60)
Glucose, Bld: 111 mg/dL — ABNORMAL HIGH (ref 70–99)
Potassium: 3.8 mmol/L (ref 3.5–5.1)
Sodium: 141 mmol/L (ref 135–145)

## 2024-01-14 LAB — MAGNESIUM: Magnesium: 2.2 mg/dL (ref 1.7–2.4)

## 2024-01-14 LAB — CULTURE, BLOOD (ROUTINE X 2)

## 2024-01-14 LAB — GLUCOSE, CAPILLARY: Glucose-Capillary: 99 mg/dL (ref 70–99)

## 2024-01-14 MED ORDER — FUROSEMIDE 20 MG PO TABS
20.0000 mg | ORAL_TABLET | Freq: Every day | ORAL | 0 refills | Status: DC | PRN
  Filled 2024-01-14: qty 90, 90d supply, fill #0

## 2024-01-14 MED ORDER — APIXABAN (ELIQUIS) VTE STARTER PACK (10MG AND 5MG)
ORAL_TABLET | ORAL | 0 refills | Status: DC
  Filled 2024-01-14: qty 74, 30d supply, fill #0

## 2024-01-14 NOTE — Progress Notes (Signed)
 Heart Failure Navigator Progress Note  Assessed for Heart & Vascular TOC clinic readiness.  Patient does not meet criteria due to EF 60-65%, has a scheduled [facility] appointment on 01/14/2024 and a scheduled TCTS with Dr. Deloise Ferries on 01/21/2024. No HF TOC.   Navigator will sign off at this time.   Randie Bustle, BSN, Scientist, clinical (histocompatibility and immunogenetics) Only

## 2024-01-14 NOTE — Progress Notes (Signed)
 Per husband is comfortable with pt walking from bed to chair & is agreeable/comfortable  with her discharge.

## 2024-01-14 NOTE — Evaluation (Signed)
 Physical Therapy Evaluation Patient Details Name: Kelly Gibson MRN: [id number] DOB: Feb 14, 1961 Today's Date: 01/14/2024  History of Present Illness  Patient is a 63 y/o female admitted 01/12/24 with SOB requiring 4L O2 in ED.  Found to have PE and with recent diagnosis of NSCLC, h/o CVA with R hemiparesis and dysarthria, bradycardia, DM, COPD, CKD, CAD and recent  NSTEMI with recent LHC showing three vessel disease with chronic total occlusions and collaterals,  Clinical Impression  Patient presents with mobility limited due to SOB and fatigue.  Normally able to walk to bathroom with her hemiwalker unaided.  Today needing min a with L HHA in the room ambulation with noted LOB to L with R LE tone (normally wears a brace).  She was dyspneic though SpO2 on RA was 97% with HR 53.  She is prepping for d/c with spouse present.  Discussed fall precautions due to new blood thinners and need for sleeping elevated as well as safety rails on bed and for mobility.  She declined follow up HHPT at this time, but discussed with spouse can set up through PCP if needed and important to start process for aide in case needed as can take several months.  No further skilled PT at this time.  Will sign off due to pt d/c home with spouse assist and has needed equipment.       If plan is discharge home, recommend the following: A lot of help with walking and/or transfers;A lot of help with bathing/dressing/bathroom   Can travel by private vehicle        Equipment Recommendations None recommended by PT  Recommendations for Other Services       Functional Status Assessment Patient has had a recent decline in their functional status and demonstrates the ability to make significant improvements in function in a reasonable and predictable amount of time.     Precautions / Restrictions Precautions Precautions: Fall Recall of Precautions/Restrictions: Intact Precaution/Restrictions Comments: R hemiparesis       Mobility  Bed Mobility               General bed mobility comments: in recliner    Transfers Overall transfer level: Needs assistance   Transfers: Sit to/from Stand Sit to Stand: Supervision           General transfer comment: heavy L UE support on armrest of recliner. HHA on L once standing for balance    Ambulation/Gait Ambulation/Gait assistance: Min assist Gait Distance (Feet): 16 Feet Assistive device: 1 person hand held assist Gait Pattern/deviations: Step-to pattern, Step-through pattern, Decreased dorsiflexion - left       General Gait Details: R stepping with hip flexion and extensor tone rest of leg; A for balance and safety with increased tone on R pushing pt over at times to L  Stairs            Wheelchair Mobility     Tilt Bed    Modified Rankin (Stroke Patients Only)       Balance Overall balance assessment: Needs assistance Sitting-balance support: Feet supported Sitting balance-Leahy Scale: Fair     Standing balance support: Single extremity supported Standing balance-Leahy Scale: Poor Standing balance comment: L UE support for balance                             Pertinent Vitals/Pain Pain Assessment Pain Assessment: No/denies pain    Home Living Family/patient expects to be discharged  to:: Private residence Living Arrangements: Spouse/significant other Available Help at Discharge: Family;Available 24 hours/day Type of Home: House Home Access: Stairs to enter   Entrance Stairs-Number of Steps: 1 Alternate Level Stairs-Number of Steps: has a chair lift on stairs but does not go up them Home Layout: Two level;Able to live on main level with bedroom/bathroom;Full bath on main level Home Equipment: BSC/3in1;Wheelchair - manual;Shower seat;Toilet riser;Adaptive equipment (hemi-walker) Additional Comments: R ankle brace    Prior Function Prior Level of Function : Needs assist             Mobility  Comments: spouse can assist as needed, normally transfers to w/c and BSC on her won, can walk to bathroom with hemiwalker as well during the day ADLs Comments: spouse assist as needed     Extremity/Trunk Assessment   Upper Extremity Assessment Upper Extremity Assessment: RUE deficits/detail RUE Deficits / Details: chronic contractures from prior CVA    Lower Extremity Assessment Lower Extremity Assessment: RLE deficits/detail RLE Deficits / Details: extensor tone throughout, flexion only with A.    Cervical / Trunk Assessment Cervical / Trunk Assessment: Other exceptions Cervical / Trunk Exceptions: elongated on R  Communication   Communication Communication: Impaired Factors Affecting Communication: Difficulty expressing self (dysarthria)    Cognition Arousal: Alert Behavior During Therapy: WFL for tasks assessed/performed   PT - Cognitive impairments: No apparent impairments                         Following commands: Intact       Cueing Cueing Techniques: Verbal cues     General Comments General comments (skin integrity, edema, etc.): increased dyspnea with ambulation, HR 53, SpO2 97% on RA; education with pt and spouse on safety and fall risk reduction now on blood thinners; also on use of rails on bed or wheelchair locked beside bed, using chair bed pillow for more upright position in bed for breathing, discuss with PCP about aide process in case needed down the road.  Pt/spouse decline HHPT for now so discussed progression of activity.    Exercises     Assessment/Plan    PT Assessment Patient does not need any further PT services  PT Problem List         PT Treatment Interventions      PT Goals (Current goals can be found in the Care Plan section)  Acute Rehab PT Goals PT Goal Formulation: All assessment and education complete, DC therapy    Frequency       Co-evaluation               AM-PAC PT 6 Clicks Mobility  Outcome  Measure Help needed turning from your back to your side while in a flat bed without using bedrails?: A Little Help needed moving from lying on your back to sitting on the side of a flat bed without using bedrails?: A Little Help needed moving to and from a bed to a chair (including a wheelchair)?: A Little Help needed standing up from a chair using your arms (e.g., wheelchair or bedside chair)?: A Little Help needed to walk in hospital room?: A Little Help needed climbing 3-5 steps with a railing? : Total 6 Click Score: 16    End of Session Equipment Utilized During Treatment: Gait belt Activity Tolerance: Patient limited by fatigue Patient left: in chair;with family/visitor present   PT Visit Diagnosis: Muscle weakness (generalized) (M62.81);Other abnormalities of gait and mobility (R26.89)  Time: 1115-1130 PT Time Calculation (min) (ACUTE ONLY): 15 min   Charges:   PT Evaluation $PT Eval Low Complexity: 1 Low   PT General Charges $$ ACUTE PT VISIT: 1 Visit         Abigail Hoff, PT Acute Rehabilitation Services Office:[phone] 01/14/2024    Kelly Gibson 01/14/2024, 11:47 AM

## 2024-01-14 NOTE — TOC Transition Note (Incomplete)
 Transition of Care [facility]) - Discharge Note   Patient Details  Name: Kelly Gibson MRN: [id number] Date of Birth: 06-07-1961  [name] CM/SW Contact:  Jeani Mill, RN Phone Number: 01/14/2024, 11:05 AM   Clinical Narrative:    Patient stable for discharge.  Husband can transport home.  Patient requesting nebulizer machine. Notified adapt of order. Will be shipped to home address on file.  No other DME needs.   Final next level of care: Home/Self Care Barriers to Discharge: Barriers Resolved   Patient Goals and CMS Choice Patient states their goals for this hospitalization and ongoing recovery are:: Return home          Discharge Placement                   home    Discharge Plan and Services Additional resources added to the After Visit Summary for                                       Social Drivers of Health (SDOH) Interventions SDOH Screenings   Food Insecurity: No Food Insecurity (01/12/2024)  Housing: Low Risk  (01/12/2024)  Transportation Needs: No Transportation Needs (01/12/2024)  Utilities: Not At Risk (01/12/2024)  Alcohol Screen: Low Risk  (04/11/2019)  Depression (PHQ2-9): Low Risk  (01/10/2024)  Financial Resource Strain: Medium Risk (01/07/2024)  Physical Activity: Inactive (01/07/2024)  Social Connections: Unknown (01/07/2024)  Stress: No Stress Concern Present (01/07/2024)  Tobacco Use: Medium Risk (01/12/2024)     Readmission Risk Interventions     No data to display

## 2024-01-14 NOTE — Progress Notes (Signed)
   01/13/24 2225  BiPAP/CPAP/SIPAP  Reason BIPAP/CPAP not in use Non-compliant (pt refused)  BiPAP/CPAP /SiPAP Vitals  Pulse Rate (!) 58  Resp 18  SpO2 92 %  Bilateral Breath Sounds Clear;Diminished  MEWS Score/Color  MEWS Score 0  MEWS Score Color Green   Pt on room air, no distress at this time.

## 2024-01-14 NOTE — Assessment & Plan Note (Signed)
 01-14-2024 continue with tresiba  and novolog  at home.

## 2024-01-14 NOTE — Assessment & Plan Note (Signed)
Body mass index is 34.6 kg/m.

## 2024-01-14 NOTE — Progress Notes (Signed)
 PROGRESS NOTE    HARMONI Gibson  RUE:[id number] DOB: 1961/05/07 DOA: 01/12/2024 PCP: Annella Kief, NP  Subjective: Pt seen and examined. Met with pt and husband howard at bedside.  Remains on RA.  Thanks for heme/onc for stopping by yesterday and introducing to patient and husband. Pt will f/u with outpatient oncology to discuss further workup and next steps.  Pt tolerating Eliquis without difficulty.  Pt excited to be discharged today.   Hospital Course: HPI: Kelly Gibson is a 63 y.o. female with medical history significant of CAD (3 vessel disease but CABG deferred per pt), AV block, COPD, HTN, HLD, DM2, CVA with residual hemiparesis and dysphagia, CKD3 c/b L renal staghorn calculi s/p L nephrostomy tube p/w recurrent SOB iso ADHF.   Pt states that she was in her USOH until this morning when she awoke at 0200 acutely SOB at rest for which she took a puff of her inhaler and her breathing stabilized enough for her to go back to sleep. Per her husband at bedside, she woke up again with SOB at rest around 0600 and tried her inhaler again to no avail; as such, they activated EMS and BIBA to [facility] ED. Pt cooks 5/7 meals per week and does NOT follow a salt-free diet.    Of note, pt was admitted a week ago on 6/10 and found to have E. Coli sepsis iso L renal staghorn calculi s/p L nephrostomy tube placement for which she received IV abx, and 3 vessel CAD (3 total occlusions with collaterals) for which CABG deferred given her ongoing infection at that time. Most importantly pt was seen to have LLL infiltrate and bronch biopsies from 6/9 demonstrate NSCLC.   In the ED, pt tachypneic on 4L [place]. Labs notable for Cr 1.3, BNP 667, and WBC 11.8. Pt admitted to medicine for ongoing care.  Significant Events: Admitted 01/12/2024 acute diastolic CHF   Admission Labs: VBG pH 7.372, PCO2 39, PO2 79 Na 140, K 4.0, CO2 of 20, BUN 21, Scr 1.30, glu 171 WBC 11.8, HgB 10.1, plt 314 BNP  667 Covid/flu/rsv negative  Admission Imaging Studies: CXR  Low volume film with vascular congestion and diffuse interstitial opacities suggesting edema. 2. Retrocardiac left base atelectasis/infiltrate CTPA Positive for left lower lobe segmental pulmonary emboli. 2. Persistent dense left lower lobe consolidation with small left pleural effusion. 3. New mild scattered ground-glass opacities in both upper lobes. 4. Cholelithiasis. 5. Left nephrolithiasis. 6.  Aortic Atherosclerosis  Significant Labs:   Significant Imaging Studies:   Antibiotic Therapy: Anti-infectives (From admission, onward)    Start     Dose/Rate Route Frequency Ordered Stop   01/12/24 1030  cefTRIAXone  (ROCEPHIN ) 1 g in sodium chloride  0.9 % 100 mL IVPB        1 g 200 mL/hr over 30 Minutes Intravenous  Once 01/12/24 1022 01/12/24 1317   01/12/24 1030  azithromycin  (ZITHROMAX ) 500 mg in sodium chloride  0.9 % 250 mL IVPB        500 mg 250 mL/hr over 60 Minutes Intravenous  Once 01/12/24 1022 01/12/24 1522       Procedures:   Consultants: PCCM    Assessment and Plan: * Acute hypoxemic respiratory failure (HCC)-resolved as of 01/14/2024 01-13-2024 pt currently on RA now. Due to acute on chronic diastolic CHF. Pt not normally on home O2.  01-14-2024 resolved. On RA.  Acute pulmonary embolism without acute cor pulmonale (HCC) 01-13-2024 stop IV heparin . Start Eliquis. Heme/onc consulted to give input  into duration of therapy. Pt without prior hx of VTE. But she now has lung cancer. ??indefinite need for systemic anticoagulation?? Hopefully, heme/onc can help answer this question.  12-2023 DC home on eliquis. F/u with heme/onc regarding duration of treatment. May need longer duration given her concomitant lung cancer.  Lung cancer (HCC) 01-13-2024 biopsy from bronch this month shows non-small cell lung cancer. Biopsy report came back after she was discharged. Will consult heme/onc.  01-14-2024 f/u with  oncology as outpatient.  Acute on chronic diastolic heart failure (HCC)-resolved as of 01/14/2024 01-13-2024 diuresed > 3 liters with 2 doses of IV lasix . Change to po lasix  20 mg daily. Echo today shows normal LVEF of 60-65%.  01-14-2024 resolved.  DM (diabetes mellitus), type 2 with renal complications (HCC) 01-13-2024 continue with semglee  and SSI.  01-14-2024 stable.  CAD (coronary artery disease), native coronary artery 01-13-2024 pt with obstructive disease based on LHC from 12-28-2023. Pt declined CABG. No chest pain at this time.  01-14-2024 f/u with cardiothoracic surgery as scheduled. Pt currently not having any resting angina. Remains on ASA and crestor . No beta blockers due to resting HR in the high 40s and low 50s.  Essential hypertension 01-13-2024 continue avapro  01-14-2024 stable. Continue with home Benicar.  Hyperlipidemia associated with type 2 diabetes mellitus (HCC) 01-13-2024 continue crestor  40 mg daily  01-14-2024 stable.  CKD stage 3a, GFR 45-59 ml/min (HCC) - Baseline Scr 1.2-1.5 01-13-2024 has had CKD since 2020.  Baseline Scr 1.2-1.5  01-14-2024 stable.  Hemiparesis affecting right side as late effect of cerebrovascular accident (CVA) (HCC) 01-13-2024 chronic.  01-14-2024 stable.  COPD (chronic obstructive pulmonary disease) (HCC) 01-13-2024 not exacerbated. Continue with inhalers.  01-14-2024 stable.  Obesity, Class I, BMI 30-34.9 Body mass index is 34.6 kg/m.   Uncontrolled type 2 diabetes mellitus with hyperglycemia, with long-term current use of insulin  (HCC) 01-14-2024 continue with tresiba  and novolog  at home.    DVT prophylaxis:  apixaban (ELIQUIS) tablet 10 mg  apixaban (ELIQUIS) tablet 5 mg     Code Status: Full Code Family Communication: discussed with pt and husband howard at bedside Disposition Plan: home Reason for continuing need for hospitalization: stable for DC to home.  Objective: Vitals:   01/14/24 0125 01/14/24  0130 01/14/24 0322 01/14/24 0756  BP:   (!) 142/62 (!) 135/59  Pulse: 64 65 (!) 45 (!) 45  Resp: 16 17 19 16   Temp:   98.5 F (36.9 C) 98.3 F (36.8 C)  TempSrc:   Oral Oral  SpO2: 96% 96% 96% 95%  Weight:   88.6 kg   Height:        Intake/Output Summary (Last 24 hours) at 01/14/2024 0951 Last data filed at 01/14/2024 0322 Gross per 24 hour  Intake 660 ml  Output 1350 ml  Net -690 ml   Filed Weights   01/12/24 1509 01/14/24 0322  Weight: 86.7 kg 88.6 kg    Examination:  Physical Exam Vitals and nursing note reviewed.  HENT:     Head: Normocephalic and atraumatic.   Eyes:     General: No scleral icterus.   Cardiovascular:     Rate and Rhythm: Normal rate.  Pulmonary:     Effort: No respiratory distress.  Abdominal:     General: There is no distension.   Skin:    Capillary Refill: Capillary refill takes less than 2 seconds.   Neurological:     Mental Status: She is alert and oriented to person, place, and time.  Comments: Mild dysarthria. Stable from yesterday    Data Reviewed: I have personally reviewed following labs and imaging studies  CBC: Recent Labs  Lab 01/10/24 1508 01/12/24 0748 01/12/24 0812 01/12/24 1427 01/13/24 0736 01/14/24 0235  WBC 6.8 11.8*  --  9.8 12.2* 10.8*  NEUTROABS 4.3 8.8*  --   --   --  8.0*  HGB 11.5 10.1* 10.5* 9.8* 10.6* 10.5*  HCT 36.6 34.9* 31.0* 32.6* 35.5* 34.0*  MCV 88 93.3  --  91.6 89.6 87.4  PLT 343 314  --  286 271 332   Basic Metabolic Panel: Recent Labs  Lab 01/10/24 1508 01/12/24 0748 01/12/24 0812 01/12/24 1427 01/13/24 0736 01/14/24 0235  NA 144 140 142  --  140 141  K 4.9 4.0 3.9  --  4.5 3.8  CL 105 106  --   --  102 105  CO2 23 20*  --   --  24 24  GLUCOSE 90 171*  --   --  175* 111*  BUN 27 21  --   --  29* 32*  CREATININE 1.29* 1.30*  --  1.21* 1.26* 1.25*  CALCIUM  9.6 8.7*  --   --  9.0 8.8*  MG 2.1  --   --   --   --  2.2   GFR: Estimated Creatinine Clearance: 49.9 mL/min (A) (by  C-G formula based on SCr of 1.25 mg/dL (H)). Liver Function Tests: Recent Labs  Lab 01/10/24 1508  AST 25  ALT 20  ALKPHOS 89  BILITOT 0.2  PROT 6.8  ALBUMIN 3.8*   BNP (last 3 results) Recent Labs    12/30/23 1400 01/12/24 0748  BNP 691.1* 667.3*   CBG: Recent Labs  Lab 01/13/24 0623 01/13/24 1124 01/13/24 1524 01/13/24 2109 01/14/24 0612  GLUCAP 191* 302* 185* 131* 99    Recent Results (from the past 240 hours)  Resp panel by RT-PCR (RSV, Flu A&B, Covid) Anterior Nasal Swab     Status: None   Collection Time: 01/12/24  7:48 AM   Specimen: Anterior Nasal Swab  Result Value Ref Range Status   SARS Coronavirus 2 by RT PCR NEGATIVE NEGATIVE Final   Influenza A by PCR NEGATIVE NEGATIVE Final   Influenza B by PCR NEGATIVE NEGATIVE Final    Comment: (NOTE) The Xpert Xpress SARS-CoV-2/FLU/RSV plus assay is intended as an aid in the diagnosis of influenza from Nasopharyngeal swab specimens and should not be used as a sole basis for treatment. Nasal washings and aspirates are unacceptable for Xpert Xpress SARS-CoV-2/FLU/RSV testing.  Fact Sheet for Patients: BloggerCourse.com  Fact Sheet for Healthcare Providers: SeriousBroker.it  This test is not yet approved or cleared by the [place]  FDA and has been authorized for detection and/or diagnosis of SARS-CoV-2 by FDA under an Emergency Use Authorization (EUA). This EUA will remain in effect (meaning this test can be used) for the duration of the COVID-19 declaration under Section 564(b)(1) of the Act, 21 U.S.C. section 360bbb-3(b)(1), unless the authorization is terminated or revoked.     Resp Syncytial Virus by PCR NEGATIVE NEGATIVE Final    Comment: (NOTE) Fact Sheet for Patients: BloggerCourse.com  Fact Sheet for Healthcare Providers: SeriousBroker.it  This test is not yet approved or cleared by the  [place]  FDA and has been authorized for detection and/or diagnosis of SARS-CoV-2 by FDA under an Emergency Use Authorization (EUA). This EUA will remain in effect (meaning this test can be used) for the duration of the COVID-19  declaration under Section 564(b)(1) of the Act, 21 U.S.C. section 360bbb-3(b)(1), unless the authorization is terminated or revoked.  Performed at [facility], [address] [place], [place] [address]   Culture, blood (Routine X 2) w Reflex to ID Panel     Status: None (Preliminary result)   Collection Time: 01/12/24  3:38 PM   Specimen: BLOOD  Result Value Ref Range Status   Specimen Description BLOOD LEFT ANTECUBITAL  Final   Special Requests   Final    BOTTLES DRAWN AEROBIC ONLY Blood Culture results may not be optimal due to an inadequate volume of blood received in culture bottles   Culture   Final    NO GROWTH 2 DAYS Performed at [facility], [address] [place], [place] [address]    Report Status PENDING  Incomplete  Culture, blood (Routine X 2) w Reflex to ID Panel     Status: None (Preliminary result)   Collection Time: 01/12/24  3:39 PM   Specimen: BLOOD LEFT HAND  Result Value Ref Range Status   Specimen Description BLOOD LEFT HAND  Final   Special Requests   Final    BOTTLES DRAWN AEROBIC ONLY Blood Culture results may not be optimal due to an inadequate volume of blood received in culture bottles   Culture   Final    NO GROWTH 2 DAYS Performed at [facility], [address] [place], [place] [address]    Report Status PENDING  Incomplete  MRSA Next Gen by PCR, Nasal     Status: None   Collection Time: 01/12/24  9:03 PM   Specimen: Nasal Mucosa; Nasal Swab  Result Value Ref Range Status   MRSA by PCR Next Gen NOT DETECTED NOT DETECTED Final    Comment: (NOTE) The GeneXpert MRSA Assay (FDA approved for NASAL specimens only), is one component of a comprehensive MRSA colonization surveillance program. It is  not intended to diagnose MRSA infection nor to guide or monitor treatment for MRSA infections. Test performance is not FDA approved in patients less than 48 years old. Performed at [facility], [address], [place], [place] [address]      Radiology Studies: ECHOCARDIOGRAM COMPLETE BUBBLE STUDY Result Date: 01/13/2024    ECHOCARDIOGRAM REPORT   Patient Name:   FRANCILLE WITTMANN Date of Exam: 01/13/2024 Medical Rec #:  [id number]         Height:       63.0 in Accession #:    [id number]        Weight:       191.1 lb Date of Birth:  January 11, 1961        BSA:          1.897 m Patient Age:    62 years          BP:           151/56 mmHg Patient Gender: F                 HR:           54 bpm. Exam Location:  Inpatient Procedure: 2D Echo, Saline Contrast Bubble Study, Cardiac Doppler, Color Doppler            and Strain Analysis (Both Spectral and Color Flow Doppler were            utilized during procedure). Indications:    CVA  History:        Patient  has prior history of Echocardiogram examinations, most                 recent 10/27/2017.  Sonographer:    Griselda Lederer Referring Phys: [id number] Arne Langdon IMPRESSIONS  1. Left ventricular ejection fraction, by estimation, is 60 to 65%. The left ventricle has normal function. The left ventricle has no regional wall motion abnormalities. There is mild concentric left ventricular hypertrophy. Left ventricular diastolic parameters are consistent with Grade II diastolic dysfunction (pseudonormalization). The average left ventricular global longitudinal strain is -9.5 %. The global longitudinal strain is abnormal.  2. Right ventricular systolic function is normal. The right ventricular size is normal.  3. Left atrial size was moderately dilated.  4. The mitral valve is normal in structure. Mild mitral valve regurgitation. No evidence of mitral stenosis.  5. The aortic valve is normal in structure. There is moderate calcification of the aortic valve. Aortic valve  regurgitation is not visualized. No aortic stenosis is present.  6. The inferior vena cava is normal in size with greater than 50% respiratory variability, suggesting right atrial pressure of 3 mmHg. FINDINGS  Left Ventricle: Left ventricular ejection fraction, by estimation, is 60 to 65%. The left ventricle has normal function. The left ventricle has no regional wall motion abnormalities. The average left ventricular global longitudinal strain is -9.5 %. Strain was performed and the global longitudinal strain is abnormal. The left ventricular internal cavity size was normal in size. There is mild concentric left ventricular hypertrophy. Left ventricular diastolic parameters are consistent with Grade II diastolic dysfunction (pseudonormalization). Right Ventricle: The right ventricular size is normal. No increase in right ventricular wall thickness. Right ventricular systolic function is normal. Left Atrium: Left atrial size was moderately dilated. Right Atrium: Right atrial size was normal in size. Pericardium: There is no evidence of pericardial effusion. Mitral Valve: The mitral valve is normal in structure. There is mild thickening of the mitral valve leaflet(s). There is mild calcification of the mitral valve leaflet(s). Mild mitral annular calcification. Mild mitral valve regurgitation. No evidence of  mitral valve stenosis. MV peak gradient, 7.8 mmHg. The mean mitral valve gradient is 2.0 mmHg. Tricuspid Valve: The tricuspid valve is normal in structure. Tricuspid valve regurgitation is trivial. No evidence of tricuspid stenosis. Aortic Valve: The aortic valve is normal in structure. There is moderate calcification of the aortic valve. Aortic valve regurgitation is not visualized. No aortic stenosis is present. Aortic valve mean gradient measures 4.0 mmHg. Aortic valve peak gradient measures 7.5 mmHg. Aortic valve area, by VTI measures 2.13 cm. Pulmonic Valve: The pulmonic valve was normal in structure.  Pulmonic valve regurgitation is not visualized. No evidence of pulmonic stenosis. Aorta: The aortic root is normal in size and structure. Venous: The inferior vena cava is normal in size with greater than 50% respiratory variability, suggesting right atrial pressure of 3 mmHg. IAS/Shunts: No atrial level shunt detected by color flow Doppler. Agitated saline contrast was given intravenously to evaluate for intracardiac shunting.  LEFT VENTRICLE PLAX 2D LVIDd:         4.60 cm   Diastology LVIDs:         3.20 cm   LV e' medial:    3.81 cm/s LV PW:         1.10 cm   LV E/e' medial:  33.3 LV IVS:        1.30 cm   LV e' lateral:   4.87 cm/s LVOT diam:     2.00  cm   LV E/e' lateral: 26.1 LV SV:         80 LV SV Index:   42        2D Longitudinal Strain LVOT Area:     3.14 cm  2D Strain GLS Avg:     -9.5 %                           3D Volume EF                          LV 3D EDV:   116.83 ml                          LV 3D ESV:   46.65 ml RIGHT VENTRICLE             IVC RV Basal diam:  4.10 cm     IVC diam: 1.80 cm RV S prime:     10.10 cm/s TAPSE (M-mode): 1.8 cm LEFT ATRIUM             Index LA diam:        4.90 cm 2.58 cm/m LA Vol (A2C):   82.1 ml 43.28 ml/m LA Vol (A4C):   99.6 ml 52.51 ml/m LA Biplane Vol: 94.6 ml 49.87 ml/m  AORTIC VALVE AV Area (Vmax):    2.34 cm AV Area (Vmean):   2.31 cm AV Area (VTI):     2.13 cm AV Vmax:           137.00 cm/s AV Vmean:          92.700 cm/s AV VTI:            0.374 m AV Peak Grad:      7.5 mmHg AV Mean Grad:      4.0 mmHg LVOT Vmax:         102.00 cm/s LVOT Vmean:        68.300 cm/s LVOT VTI:          0.254 m LVOT/AV VTI ratio: 0.68  AORTA Ao Root diam: 2.90 cm Ao Asc diam:  3.40 cm MITRAL VALVE MV Area (PHT): 1.74 cm     SHUNTS MV Area VTI:   2.00 cm     Systemic VTI:  0.25 m MV Peak grad:  7.8 mmHg     Systemic Diam: 2.00 cm MV Mean grad:  2.0 mmHg MV Vmax:       1.40 m/s MV Vmean:      57.7 cm/s MV Decel Time: 437 msec MV E velocity: 127.00 cm/s MV A velocity: 74.50  cm/s MV E/A ratio:  1.70 Aditya Sabharwal Electronically signed by Alwin Baars Signature Date/Time: 01/13/2024/12:58:14 PM    Final    CT Angio Chest Pulmonary Embolism (PE) W or WO Contrast Result Date: 01/12/2024 CLINICAL DATA:  Pulmonary embolism (PE) suspected, high prob EXAM: CT ANGIOGRAPHY CHEST WITH CONTRAST TECHNIQUE: Multidetector CT imaging of the chest was performed using the standard protocol during bolus administration of intravenous contrast. Multiplanar CT image reconstructions and MIPs were obtained to evaluate the vascular anatomy. RADIATION DOSE REDUCTION: This exam was performed according to the departmental dose-optimization program which includes automated exposure control, adjustment of the mA and/or kV according to patient size and/or use of iterative reconstruction technique. CONTRAST:  60mL OMNIPAQUE  IOHEXOL  350 MG/ML SOLN COMPARISON:  12/31/2023 and previous FINDINGS: Cardiovascular: Heart size  normal. No pericardial effusion. Good contrast opacification of the pulmonary arterial tree. Segmental filling defects in 2 left lower lobe branches. No right-sided pulmonary emboli. Heavy coronary arterial calcifications. Incomplete contrast opacification of the thoracic aorta, with no evidence of aneurysm. Scattered calcified plaque in the arch and descending thoracic aorta. Mediastinum/Nodes: No mediastinal mass or hematoma. Stable borderline enlarged right paratracheal node. Subcentimeter anterior mediastinal nodes. No definite hilar adenopathy. Lungs/Pleura: Persistent dense consolidation in the left lower lobe. Small left pleural effusion, slightly increased. Mild scattered ground-glass opacities in both upper lobes with somewhat geographic distribution, new since previous. Upper Abdomen: Subcentimeter calcified gallstone in the dependent aspect of the nondilated gallbladder. 4 mm calculus in the upper pole left renal collecting system, incompletely visualized. No acute findings.  Musculoskeletal:   Right shoulder DJD.  No acute findings. Review of the MIP images confirms the above findings. IMPRESSION: 1. Positive for left lower lobe segmental pulmonary emboli. 2. Persistent dense left lower lobe consolidation with small left pleural effusion. 3. New mild scattered ground-glass opacities in both upper lobes. 4. Cholelithiasis. 5. Left nephrolithiasis. 6.  Aortic Atherosclerosis (ICD10-I70.0). Electronically Signed   By: Nicoletta Barrier M.D.   On: 01/12/2024 15:10    Scheduled Meds:  apixaban  10 mg Oral BID   Followed by   Cecily Cohen ON 01/20/2024] apixaban  5 mg Oral BID   aspirin  EC  81 mg Oral Daily   budesonide -glycopyrrolate -formoterol   2 puff Inhalation BID   citalopram   10 mg Oral Daily   furosemide   20 mg Oral Daily   insulin  aspart  0-15 Units Subcutaneous TID WC   insulin  aspart  0-5 Units Subcutaneous QHS   insulin  glargine-yfgn  10 Units Subcutaneous Daily   irbesartan  150 mg Oral Daily   melatonin  10 mg Oral QHS   rosuvastatin   40 mg Oral Daily   Continuous Infusions:   LOS: 2 days   Time spent: 50 minutes  Unk Garb, DO  [facility]  01/14/2024, 9:51 AM

## 2024-01-14 NOTE — Discharge Summary (Signed)
 [facility] Physician Discharge Summary   Patient name: Kelly Gibson  Admit date:     01/12/2024  Discharge date: 01/14/2024  Attending Physician: Arne Langdon [[id number]]  Discharge Physician: Unk Garb   PCP: Annella Kief, NP  Admitted From: Home  Disposition:  Home  Recommendations for Outpatient Follow-up:  Follow up with PCP in 1-2 weeks Keep appointment with cardiothoracic surgery and oncology Ambulatory referral made to CHF clinic.  Home Health:No Equipment/Devices: None    Discharge Condition:Stable CODE STATUS:FULL Diet recommendation: Heart Healthy/Diabetic Fluid Restriction: None  Hospital Summary: HPI: Kelly Gibson is a 63 y.o. female with medical history significant of CAD (3 vessel disease but CABG deferred per pt), AV block, COPD, HTN, HLD, DM2, CVA with residual hemiparesis and dysphagia, CKD3 c/b L renal staghorn calculi s/p L nephrostomy tube p/w recurrent SOB iso ADHF.   Pt states that she was in her USOH until this morning when she awoke at 0200 acutely SOB at rest for which she took a puff of her inhaler and her breathing stabilized enough for her to go back to sleep. Per her husband at bedside, she woke up again with SOB at rest around 0600 and tried her inhaler again to no avail; as such, they activated EMS and BIBA to [facility] ED. Pt cooks 5/7 meals per week and does NOT follow a salt-free diet.    Of note, pt was admitted a week ago on 6/10 and found to have E. Coli sepsis iso L renal staghorn calculi s/p L nephrostomy tube placement for which she received IV abx, and 3 vessel CAD (3 total occlusions with collaterals) for which CABG deferred given her ongoing infection at that time. Most importantly pt was seen to have LLL infiltrate and bronch biopsies from 6/9 demonstrate NSCLC.   In the ED, pt tachypneic on 4L [place]. Labs notable for Cr 1.3, BNP 667, and WBC 11.8. Pt admitted to medicine for ongoing care.  Significant Events: Admitted  01/12/2024 acute diastolic CHF   Admission Labs: VBG pH 7.372, PCO2 39, PO2 79 Na 140, K 4.0, CO2 of 20, BUN 21, Scr 1.30, glu 171 WBC 11.8, HgB 10.1, plt 314 BNP 667 Covid/flu/rsv negative  Admission Imaging Studies: CXR  Low volume film with vascular congestion and diffuse interstitial opacities suggesting edema. 2. Retrocardiac left base atelectasis/infiltrate CTPA Positive for left lower lobe segmental pulmonary emboli. 2. Persistent dense left lower lobe consolidation with small left pleural effusion. 3. New mild scattered ground-glass opacities in both upper lobes. 4. Cholelithiasis. 5. Left nephrolithiasis. 6.  Aortic Atherosclerosis  Significant Labs:   Significant Imaging Studies:   Antibiotic Therapy: Anti-infectives (From admission, onward)    Start     Dose/Rate Route Frequency Ordered Stop   01/12/24 1030  cefTRIAXone  (ROCEPHIN ) 1 g in sodium chloride  0.9 % 100 mL IVPB        1 g 200 mL/hr over 30 Minutes Intravenous  Once 01/12/24 1022 01/12/24 1317   01/12/24 1030  azithromycin  (ZITHROMAX ) 500 mg in sodium chloride  0.9 % 250 mL IVPB        500 mg 250 mL/hr over 60 Minutes Intravenous  Once 01/12/24 1022 01/12/24 1522       Procedures:   Consultants: [facility] Course by Problem: * Acute hypoxemic respiratory failure (HCC)-resolved as of 01/14/2024 01-13-2024 pt currently on RA now. Due to acute on chronic diastolic CHF. Pt not normally on home O2.  01-14-2024 resolved. On RA.  Acute pulmonary  embolism without acute cor pulmonale (HCC) 01-13-2024 stop IV heparin . Start Eliquis. Heme/onc consulted to give input into duration of therapy. Pt without prior hx of VTE. But she now has lung cancer. ??indefinite need for systemic anticoagulation?? Hopefully, heme/onc can help answer this question.  12-2023 DC home on eliquis. F/u with heme/onc regarding duration of treatment. May need longer duration given her concomitant lung cancer.  Lung cancer  (HCC) 01-13-2024 biopsy from bronch this month shows non-small cell lung cancer. Biopsy report came back after she was discharged. Will consult heme/onc.  01-14-2024 f/u with oncology as outpatient.  Acute on chronic diastolic heart failure (HCC)-resolved as of 01/14/2024 01-13-2024 diuresed > 3 liters with 2 doses of IV lasix . Change to po lasix  20 mg daily. Echo today shows normal LVEF of 60-65%.  01-14-2024 resolved.  DM (diabetes mellitus), type 2 with renal complications (HCC) 01-13-2024 continue with semglee  and SSI.  01-14-2024 stable.  CAD (coronary artery disease), native coronary artery 01-13-2024 pt with obstructive disease based on LHC from 12-28-2023. Pt declined CABG. No chest pain at this time.  01-14-2024 f/u with cardiothoracic surgery as scheduled. Pt currently not having any resting angina. Remains on ASA and crestor . No beta blockers due to resting HR in the high 40s and low 50s.  Essential hypertension 01-13-2024 continue avapro  01-14-2024 stable. Continue with home Benicar.  Hyperlipidemia associated with type 2 diabetes mellitus (HCC) 01-13-2024 continue crestor  40 mg daily  01-14-2024 stable.  CKD stage 3a, GFR 45-59 ml/min (HCC) - Baseline Scr 1.2-1.5 01-13-2024 has had CKD since 2020.  Baseline Scr 1.2-1.5  01-14-2024 stable.  Hemiparesis affecting right side as late effect of cerebrovascular accident (CVA) (HCC) 01-13-2024 chronic.  01-14-2024 stable.  COPD (chronic obstructive pulmonary disease) (HCC) 01-13-2024 not exacerbated. Continue with inhalers.  01-14-2024 stable.  Obesity, Class I, BMI 30-34.9 Body mass index is 34.6 kg/m.   Uncontrolled type 2 diabetes mellitus with hyperglycemia, with long-term current use of insulin  (HCC) 01-14-2024 continue with tresiba  and novolog  at home.      Discharge Diagnoses:  Active Problems:   Acute pulmonary embolism without acute cor pulmonale (HCC)   Lung cancer (HCC)   COPD (chronic  obstructive pulmonary disease) (HCC)   Hemiparesis affecting right side as late effect of cerebrovascular accident (CVA) (HCC)   CKD stage 3a, GFR 45-59 ml/min (HCC) - Baseline Scr 1.2-1.5   Hyperlipidemia associated with type 2 diabetes mellitus (HCC)   Essential hypertension   CAD (coronary artery disease), native coronary artery   DM (diabetes mellitus), type 2 with renal complications (HCC)   Uncontrolled type 2 diabetes mellitus with hyperglycemia, with long-term current use of insulin  (HCC)   Obesity, Class I, BMI 30-34.9   Discharge Instructions  Discharge Instructions     (HEART FAILURE PATIENTS) Call MD:  Anytime you have any of the following symptoms: 1) 3 pound weight gain in 24 hours or 5 pounds in 1 week 2) shortness of breath, with or without a dry hacking cough 3) swelling in the hands, feet or stomach 4) if you have to sleep on extra pillows at night in order to breathe.   Complete by: As directed    AMB referral to CHF clinic   Complete by: As directed    Reason for referral: Diastolic HF   Call MD for:  difficulty breathing, headache or visual disturbances   Complete by: As directed    Call MD for:  extreme fatigue   Complete by: As directed  Call MD for:  hives   Complete by: As directed    Call MD for:  persistant dizziness or light-headedness   Complete by: As directed    Call MD for:  persistant nausea and vomiting   Complete by: As directed    Call MD for:  redness, tenderness, or signs of infection (pain, swelling, redness, odor or green/yellow discharge around incision site)   Complete by: As directed    Call MD for:  severe uncontrolled pain   Complete by: As directed    Call MD for:  temperature >100.4   Complete by: As directed    Diet - low sodium heart healthy   Complete by: As directed    Diet Carb Modified   Complete by: As directed    Discharge instructions   Complete by: As directed    1. Follow up with your primary care provider in 1-2  weeks following discharge from hospital. 2. Keep appointment with heart surgeon and cancer doctor.   Increase activity slowly   Complete by: As directed       Allergies as of 01/14/2024       Reactions   Latex Itching, Rash   Niacin Nausea And Vomiting        Medication List     STOP taking these medications    Tradjenta  5 MG Tabs tablet Generic drug: linagliptin        TAKE these medications    Apixaban Starter Pack (10mg  and 5mg ) Commonly known as: ELIQUIS STARTER PACK Take as directed on package: start with two-5mg  tablets twice daily for 7 days. On day 8, switch to one-5mg  tablet twice daily.   aspirin  EC 81 MG tablet Take 1 tablet (81 mg total) by mouth daily. Swallow whole.   Breztri  Aerosphere 160-9-4.8 MCG/ACT Aero inhaler Generic drug: budesonide -glycopyrrolate -formoterol  Inhale 2 puffs into the lungs 2 (two) times daily.   citalopram  10 MG tablet Commonly known as: CELEXA  TAKE 1 TABLET BY MOUTH DAILY   Dexcom G7 Sensor Misc Take 1 Device by mouth See admin instructions. Change sensor every 10 days   furosemide  20 MG tablet Commonly known as: LASIX  Take 1 tablet (20 mg total) by mouth daily as needed for fluid or edema (take if daily weight is greater than 2 lbs from the previous day's weight.). What changed: reasons to take this   HumaLOG  KwikPen 100 UNIT/ML KwikPen Generic drug: insulin  lispro INJECT 3 TO 5 UNITS INTO THE SKIN DAILY BEFORE LUNCH   hydrALAZINE  100 MG tablet Commonly known as: APRESOLINE  1 tablet up to every 12 hours only as needed for blood pressure >150/90   Melatonin 5 MG Chew Chew 10 mg by mouth at bedtime.   Multivitamin Gummies Womens Chew Chew 2 each by mouth in the morning.   olmesartan 40 MG tablet Commonly known as: BENICAR Take 40 mg by mouth in the morning.   rosuvastatin  40 MG tablet Commonly known as: CRESTOR  TAKE 1 TABLET BY MOUTH DAILY   Tresiba  FlexTouch 100 UNIT/ML FlexTouch Pen Generic drug: insulin   degludec Inject 10 Units into the skin 2 (two) times daily. Adjust by 2 units every 3 days as needed for blood sugars higher than 150 in the morning fasting.   UNABLE TO FIND Take 1 tablet by mouth with breakfast, with lunch, and with evening meal. GlucoGold -Berberine, Concentrated Cinnamon, Chromium, Banaba Leaf Extract   UNISOM PO Take 2 each by mouth at bedtime. Gel capsules   Ventolin  HFA 108 (90 Base) MCG/ACT  inhaler Generic drug: albuterol  Inhale 2 puffs into the lungs every 6 (six) hours as needed for wheezing or shortness of breath.   albuterol  (2.5 MG/3ML) 0.083% nebulizer solution Commonly known as: PROVENTIL  Inhale 3 mLs (2.5 mg total) into the lungs every 6 (six) hours as needed for wheezing or shortness of breath.        Allergies  Allergen Reactions   Latex Itching and Rash   Niacin Nausea And Vomiting    Discharge Exam: Vitals:   01/14/24 0322 01/14/24 0756  BP: (!) 142/62 (!) 135/59  Pulse: (!) 45 (!) 45  Resp: 19 16  Temp: 98.5 F (36.9 C) 98.3 F (36.8 C)  SpO2: 96% 95%    Physical Exam Vitals and nursing note reviewed.  HENT:     Head: Normocephalic and atraumatic.   Eyes:     General: No scleral icterus.   Cardiovascular:     Rate and Rhythm: Normal rate.  Pulmonary:     Effort: No respiratory distress.  Abdominal:     General: There is no distension.   Skin:    Capillary Refill: Capillary refill takes less than 2 seconds.   Neurological:     Mental Status: She is alert and oriented to person, place, and time.     Comments: Mild dysarthria. Stable from yesterday    The results of significant diagnostics from this hospitalization (including imaging, microbiology, ancillary and laboratory) are listed below for reference.    Microbiology: Recent Results (from the past 240 hours)  Resp panel by RT-PCR (RSV, Flu A&B, Covid) Anterior Nasal Swab     Status: None   Collection Time: 01/12/24  7:48 AM   Specimen: Anterior Nasal Swab   Result Value Ref Range Status   SARS Coronavirus 2 by RT PCR NEGATIVE NEGATIVE Final   Influenza A by PCR NEGATIVE NEGATIVE Final   Influenza B by PCR NEGATIVE NEGATIVE Final    Comment: (NOTE) The Xpert Xpress SARS-CoV-2/FLU/RSV plus assay is intended as an aid in the diagnosis of influenza from Nasopharyngeal swab specimens and should not be used as a sole basis for treatment. Nasal washings and aspirates are unacceptable for Xpert Xpress SARS-CoV-2/FLU/RSV testing.  Fact Sheet for Patients: BloggerCourse.com  Fact Sheet for Healthcare Providers: SeriousBroker.it  This test is not yet approved or cleared by the [place]  FDA and has been authorized for detection and/or diagnosis of SARS-CoV-2 by FDA under an Emergency Use Authorization (EUA). This EUA will remain in effect (meaning this test can be used) for the duration of the COVID-19 declaration under Section 564(b)(1) of the Act, 21 U.S.C. section 360bbb-3(b)(1), unless the authorization is terminated or revoked.     Resp Syncytial Virus by PCR NEGATIVE NEGATIVE Final    Comment: (NOTE) Fact Sheet for Patients: BloggerCourse.com  Fact Sheet for Healthcare Providers: SeriousBroker.it  This test is not yet approved or cleared by the [place]  FDA and has been authorized for detection and/or diagnosis of SARS-CoV-2 by FDA under an Emergency Use Authorization (EUA). This EUA will remain in effect (meaning this test can be used) for the duration of the COVID-19 declaration under Section 564(b)(1) of the Act, 21 U.S.C. section 360bbb-3(b)(1), unless the authorization is terminated or revoked.  Performed at [facility], [address] [place], [place] [address]   Culture, blood (Routine X 2) w Reflex to ID Panel     Status: None (Preliminary result)   Collection Time: 01/12/24  3:38 PM   Specimen: BLOOD  Result Value Ref Range Status   Specimen Description BLOOD LEFT ANTECUBITAL  Final   Special Requests   Final    BOTTLES DRAWN AEROBIC ONLY Blood Culture results may not be optimal due to an inadequate volume of blood received in culture bottles   Culture   Final    NO GROWTH 2 DAYS Performed at [facility], [address], [place], [place] [address]    Report Status PENDING  Incomplete  Culture, blood (Routine X 2) w Reflex to ID Panel     Status: None (Preliminary result)   Collection Time: 01/12/24  3:39 PM   Specimen: BLOOD LEFT HAND  Result Value Ref Range Status   Specimen Description BLOOD LEFT HAND  Final   Special Requests   Final    BOTTLES DRAWN AEROBIC ONLY Blood Culture results may not be optimal due to an inadequate volume of blood received in culture bottles   Culture   Final    NO GROWTH 2 DAYS Performed at [facility], [address], [place], [place] [address]    Report Status PENDING  Incomplete  MRSA Next Gen by PCR, Nasal     Status: None   Collection Time: 01/12/24  9:03 PM   Specimen: Nasal Mucosa; Nasal Swab  Result Value Ref Range Status   MRSA by PCR Next Gen NOT DETECTED NOT DETECTED Final    Comment: (NOTE) The GeneXpert MRSA Assay (FDA approved for NASAL specimens only), is one component of a comprehensive MRSA colonization surveillance program. It is not intended to diagnose MRSA infection nor to guide or monitor treatment for MRSA infections. Test performance is not FDA approved in patients less than 21 years old. Performed at [facility], [address], [place], [place] [address]      Labs: BNP (last 3 results) Recent Labs    12/30/23 1400 01/12/24 0748  BNP 691.1* 667.3*   Basic Metabolic Panel: Recent Labs  Lab 01/10/24 1508 01/12/24 0748 01/12/24 0812 01/12/24 1427 01/13/24 0736 01/14/24 0235  NA 144 140 142  --  140 141  K 4.9 4.0 3.9  --  4.5 3.8  CL 105 106  --   --  102 105  CO2 23 20*  --   --  24  24  GLUCOSE 90 171*  --   --  175* 111*  BUN 27 21  --   --  29* 32*  CREATININE 1.29* 1.30*  --  1.21* 1.26* 1.25*  CALCIUM  9.6 8.7*  --   --  9.0 8.8*  MG 2.1  --   --   --   --  2.2   Liver Function Tests: Recent Labs  Lab 01/10/24 1508  AST 25  ALT 20  ALKPHOS 89  BILITOT 0.2  PROT 6.8  ALBUMIN 3.8*   CBC: Recent Labs  Lab 01/10/24 1508 01/12/24 0748 01/12/24 0812 01/12/24 1427 01/13/24 0736 01/14/24 0235  WBC 6.8 11.8*  --  9.8 12.2* 10.8*  NEUTROABS 4.3 8.8*  --   --   --  8.0*  HGB 11.5 10.1* 10.5* 9.8* 10.6* 10.5*  HCT 36.6 34.9* 31.0* 32.6* 35.5* 34.0*  MCV 88 93.3  --  91.6 89.6 87.4  PLT 343 314  --  286 271 332   BNP: Recent Labs  Lab 01/12/24 0748  BNP 667.3*   CBG: Recent Labs  Lab 01/13/24 0623 01/13/24 1124 01/13/24 1524 01/13/24 2109 01/14/24 0612  GLUCAP 191* 302*  185* 131* 99   Urinalysis    Component Value Date/Time   COLORURINE YELLOW 12/30/2023 2100   APPEARANCEUR CLEAR 12/30/2023 2100   LABSPEC 1.006 12/30/2023 2100   PHURINE 6.0 12/30/2023 2100   GLUCOSEU NEGATIVE 12/30/2023 2100   HGBUR NEGATIVE 12/30/2023 2100   BILIRUBINUR NEGATIVE 12/30/2023 2100   KETONESUR NEGATIVE 12/30/2023 2100   PROTEINUR NEGATIVE 12/30/2023 2100   NITRITE NEGATIVE 12/30/2023 2100   LEUKOCYTESUR NEGATIVE 12/30/2023 2100   Sepsis Labs Recent Labs  Lab 01/12/24 0748 01/12/24 1427 01/13/24 0736 01/14/24 0235  WBC 11.8* 9.8 12.2* 10.8*    Procedures/Studies: ECHOCARDIOGRAM COMPLETE BUBBLE STUDY Result Date: 01/13/2024    ECHOCARDIOGRAM REPORT   Patient Name:   MALA GIBBARD Date of Exam: 01/13/2024 Medical Rec #:  [id number]         Height:       63.0 in Accession #:    [id number]        Weight:       191.1 lb Date of Birth:  Feb 17, 1961        BSA:          1.897 m Patient Age:    62 years          BP:           151/56 mmHg Patient Gender: F                 HR:           54 bpm. Exam Location:  Inpatient Procedure: 2D Echo, Saline Contrast  Bubble Study, Cardiac Doppler, Color Doppler            and Strain Analysis (Both Spectral and Color Flow Doppler were            utilized during procedure). Indications:    CVA  History:        Patient has prior history of Echocardiogram examinations, most                 recent 10/27/2017.  Sonographer:    Griselda Lederer Referring Phys: [id number] Arne Langdon IMPRESSIONS  1. Left ventricular ejection fraction, by estimation, is 60 to 65%. The left ventricle has normal function. The left ventricle has no regional wall motion abnormalities. There is mild concentric left ventricular hypertrophy. Left ventricular diastolic parameters are consistent with Grade II diastolic dysfunction (pseudonormalization). The average left ventricular global longitudinal strain is -9.5 %. The global longitudinal strain is abnormal.  2. Right ventricular systolic function is normal. The right ventricular size is normal.  3. Left atrial size was moderately dilated.  4. The mitral valve is normal in structure. Mild mitral valve regurgitation. No evidence of mitral stenosis.  5. The aortic valve is normal in structure. There is moderate calcification of the aortic valve. Aortic valve regurgitation is not visualized. No aortic stenosis is present.  6. The inferior vena cava is normal in size with greater than 50% respiratory variability, suggesting right atrial pressure of 3 mmHg. FINDINGS  Left Ventricle: Left ventricular ejection fraction, by estimation, is 60 to 65%. The left ventricle has normal function. The left ventricle has no regional wall motion abnormalities. The average left ventricular global longitudinal strain is -9.5 %. Strain was performed and the global longitudinal strain is abnormal. The left ventricular internal cavity size was normal in size. There is mild concentric left ventricular hypertrophy. Left ventricular diastolic parameters are consistent with Grade II diastolic dysfunction (pseudonormalization). Right Ventricle:  The right ventricular size  is normal. No increase in right ventricular wall thickness. Right ventricular systolic function is normal. Left Atrium: Left atrial size was moderately dilated. Right Atrium: Right atrial size was normal in size. Pericardium: There is no evidence of pericardial effusion. Mitral Valve: The mitral valve is normal in structure. There is mild thickening of the mitral valve leaflet(s). There is mild calcification of the mitral valve leaflet(s). Mild mitral annular calcification. Mild mitral valve regurgitation. No evidence of  mitral valve stenosis. MV peak gradient, 7.8 mmHg. The mean mitral valve gradient is 2.0 mmHg. Tricuspid Valve: The tricuspid valve is normal in structure. Tricuspid valve regurgitation is trivial. No evidence of tricuspid stenosis. Aortic Valve: The aortic valve is normal in structure. There is moderate calcification of the aortic valve. Aortic valve regurgitation is not visualized. No aortic stenosis is present. Aortic valve mean gradient measures 4.0 mmHg. Aortic valve peak gradient measures 7.5 mmHg. Aortic valve area, by VTI measures 2.13 cm. Pulmonic Valve: The pulmonic valve was normal in structure. Pulmonic valve regurgitation is not visualized. No evidence of pulmonic stenosis. Aorta: The aortic root is normal in size and structure. Venous: The inferior vena cava is normal in size with greater than 50% respiratory variability, suggesting right atrial pressure of 3 mmHg. IAS/Shunts: No atrial level shunt detected by color flow Doppler. Agitated saline contrast was given intravenously to evaluate for intracardiac shunting.  LEFT VENTRICLE PLAX 2D LVIDd:         4.60 cm   Diastology LVIDs:         3.20 cm   LV e' medial:    3.81 cm/s LV PW:         1.10 cm   LV E/e' medial:  33.3 LV IVS:        1.30 cm   LV e' lateral:   4.87 cm/s LVOT diam:     2.00 cm   LV E/e' lateral: 26.1 LV SV:         80 LV SV Index:   42        2D Longitudinal Strain LVOT Area:     3.14 cm   2D Strain GLS Avg:     -9.5 %                           3D Volume EF                          LV 3D EDV:   116.83 ml                          LV 3D ESV:   46.65 ml RIGHT VENTRICLE             IVC RV Basal diam:  4.10 cm     IVC diam: 1.80 cm RV S prime:     10.10 cm/s TAPSE (M-mode): 1.8 cm LEFT ATRIUM             Index LA diam:        4.90 cm 2.58 cm/m LA Vol (A2C):   82.1 ml 43.28 ml/m LA Vol (A4C):   99.6 ml 52.51 ml/m LA Biplane Vol: 94.6 ml 49.87 ml/m  AORTIC VALVE AV Area (Vmax):    2.34 cm AV Area (Vmean):   2.31 cm AV Area (VTI):     2.13 cm AV Vmax:  137.00 cm/s AV Vmean:          92.700 cm/s AV VTI:            0.374 m AV Peak Grad:      7.5 mmHg AV Mean Grad:      4.0 mmHg LVOT Vmax:         102.00 cm/s LVOT Vmean:        68.300 cm/s LVOT VTI:          0.254 m LVOT/AV VTI ratio: 0.68  AORTA Ao Root diam: 2.90 cm Ao Asc diam:  3.40 cm MITRAL VALVE MV Area (PHT): 1.74 cm     SHUNTS MV Area VTI:   2.00 cm     Systemic VTI:  0.25 m MV Peak grad:  7.8 mmHg     Systemic Diam: 2.00 cm MV Mean grad:  2.0 mmHg MV Vmax:       1.40 m/s MV Vmean:      57.7 cm/s MV Decel Time: 437 msec MV E velocity: 127.00 cm/s MV A velocity: 74.50 cm/s MV E/A ratio:  1.70 Aditya Sabharwal Electronically signed by Alwin Baars Signature Date/Time: 01/13/2024/12:58:14 PM    Final    CT Angio Chest Pulmonary Embolism (PE) W or WO Contrast Result Date: 01/12/2024 CLINICAL DATA:  Pulmonary embolism (PE) suspected, high prob EXAM: CT ANGIOGRAPHY CHEST WITH CONTRAST TECHNIQUE: Multidetector CT imaging of the chest was performed using the standard protocol during bolus administration of intravenous contrast. Multiplanar CT image reconstructions and MIPs were obtained to evaluate the vascular anatomy. RADIATION DOSE REDUCTION: This exam was performed according to the departmental dose-optimization program which includes automated exposure control, adjustment of the mA and/or kV according to patient size and/or use of  iterative reconstruction technique. CONTRAST:  60mL OMNIPAQUE  IOHEXOL  350 MG/ML SOLN COMPARISON:  12/31/2023 and previous FINDINGS: Cardiovascular: Heart size normal. No pericardial effusion. Good contrast opacification of the pulmonary arterial tree. Segmental filling defects in 2 left lower lobe branches. No right-sided pulmonary emboli. Heavy coronary arterial calcifications. Incomplete contrast opacification of the thoracic aorta, with no evidence of aneurysm. Scattered calcified plaque in the arch and descending thoracic aorta. Mediastinum/Nodes: No mediastinal mass or hematoma. Stable borderline enlarged right paratracheal node. Subcentimeter anterior mediastinal nodes. No definite hilar adenopathy. Lungs/Pleura: Persistent dense consolidation in the left lower lobe. Small left pleural effusion, slightly increased. Mild scattered ground-glass opacities in both upper lobes with somewhat geographic distribution, new since previous. Upper Abdomen: Subcentimeter calcified gallstone in the dependent aspect of the nondilated gallbladder. 4 mm calculus in the upper pole left renal collecting system, incompletely visualized. No acute findings. Musculoskeletal:   Right shoulder DJD.  No acute findings. Review of the MIP images confirms the above findings. IMPRESSION: 1. Positive for left lower lobe segmental pulmonary emboli. 2. Persistent dense left lower lobe consolidation with small left pleural effusion. 3. New mild scattered ground-glass opacities in both upper lobes. 4. Cholelithiasis. 5. Left nephrolithiasis. 6.  Aortic Atherosclerosis (ICD10-I70.0). Electronically Signed   By: Nicoletta Barrier M.D.   On: 01/12/2024 15:10   DG Chest Port 1 View Result Date: 01/12/2024 CLINICAL DATA:  Shortness of breath. EXAM: PORTABLE CHEST 1 VIEW COMPARISON:  01/03/2024 FINDINGS: Low lung volumes. The cardio pericardial silhouette is enlarged. Vascular congestion diffuse interstitial opacities suggesting edema. Retrocardiac left  base atelectasis/infiltrate noted. No acute bony abnormality. Telemetry leads overlie the chest. IMPRESSION: 1. Low volume film with vascular congestion and diffuse interstitial opacities suggesting edema. 2. Retrocardiac left base atelectasis/infiltrate.  Electronically Signed   By: Donnal Fusi M.D.   On: 01/12/2024 09:32   DG Swallowing Func-Speech Pathology Result Date: 01/04/2024 Table formatting from the original result was not included. Modified Barium Swallow Study Patient Details Name: MURLEAN SEELYE MRN: [id number] Date of Birth: Jul 22, 1961 Today's Date: 01/04/2024 HPI/PMH: HPI: 63 yo female with recent admission for NSTEMI, E. coli bacteremia and intermittent heart block, mild to have an obstructing kidney stone.  Troponin is markedly elevated into the 15,000's.  She underwent outpatient nuclear stress testing for cardiac risk assessment and had significant reversible anterior ischemia.  She had outpatient cardiac catheterization but no intervention although bypass grafting is considered.  She now presents back 2 days later with chest pain, nausea and vomiting. Persistent left lower lobe infiltrate first noted May 2025 during hospitalization in [place]  for E. coli bacteremia.  Of note, chest x-ray from October 2024 shows left lower lobe alveolar process. She was a 1 pack/day smoker until she quit in 2017, about 20 pack years .  Concern is for malignancy per MD as well as recurrent aspiration.  She has been on regular diet even though she was advised on a dysphagia diet after her stroke and she clearly provides a history of excessive aspiration and choking on her food. Patient seen by SLP 12/28/2023 with recommendations for a regular diet. Discharged from [facility] in 2019 on a regular diet, thin liquid. Clinical Impression: Clinical Impression: Patient presents with a mild oral dysphagia characterized by left sided facial assymetry and decreased strength resulting in mildly prolonged mastication of regular  texture solids and mildly decreased oral containment of thin liquids, at times with swallow delayed to the pyriform sinuses which is suspected to be baseline from previous CVA. She is able to fully protect her airway however with no penetration or aspiration observed and with full oral and pharyngeal clearance. Patient may remain on a regular diet, thin liquid. No acute f/u indicated at this time. Factors that may increase risk of adverse event in presence of aspiration [name]): No data recorded Recommendations/Plan: Swallowing Evaluation Recommendations Swallowing Evaluation Recommendations Recommendations: PO diet PO Diet Recommendation: Regular; Thin liquids (Level 0) Liquid Administration via: Cup; Straw Medication Administration: Whole meds with liquid Supervision: Patient able to self-feed Swallowing strategies  : Slow rate; Small bites/sips Postural changes: Position pt fully upright for meals Oral care recommendations: Oral care BID (2x/day) Treatment Plan Treatment Plan Treatment recommendations: No treatment recommended at this time Follow-up recommendations: No SLP follow up Functional status assessment: Patient has not had a recent decline in their functional status. Recommendations Recommendations for follow up therapy are one component of a multi-disciplinary discharge planning process, led by the attending physician.  Recommendations may be updated based on patient status, additional functional criteria and insurance authorization. Assessment: Orofacial Exam: Orofacial Exam Oral Cavity: Oral Hygiene: WFL Oral Cavity - Dentition: Adequate natural dentition Orofacial Anatomy: WFL Oral Motor/Sensory Function: Suspected cranial nerve impairment CN VII - Facial: Left motor impairment (from previous CVA) Anatomy: Anatomy: WFL Boluses Administered: Boluses Administered Boluses Administered: Thin liquids (Level 0); Mildly thick liquids (Level 2, nectar thick); Puree; Solid  Oral Impairment  Domain: Oral Impairment Domain Lip Closure: No labial escape Tongue control during bolus hold: Posterior escape of less than half of bolus Bolus preparation/mastication: Slow prolonged chewing/mashing with complete recollection Bolus transport/lingual motion: Brisk tongue motion Oral residue: Complete oral clearance Initiation of pharyngeal swallow : Pyriform sinuses  Pharyngeal Impairment Domain: Pharyngeal Impairment Domain Soft palate elevation:  No bolus between soft palate (SP)/pharyngeal wall (PW) Laryngeal elevation: Complete superior movement of thyroid cartilage with complete approximation of arytenoids to epiglottic petiole Anterior hyoid excursion: Complete anterior movement Epiglottic movement: Complete inversion Laryngeal vestibule closure: Complete, no air/contrast in laryngeal vestibule Pharyngeal stripping wave : Present - complete Pharyngeal contraction (A/P view only): N/A Pharyngoesophageal segment opening: Complete distension and complete duration, no obstruction of flow Tongue base retraction: No contrast between tongue base and posterior pharyngeal wall (PPW) Pharyngeal residue: Complete pharyngeal clearance  Esophageal Impairment Domain: Esophageal Impairment Domain Esophageal clearance upright position: Complete clearance, esophageal coating Pill: Pill Consistency administered: Thin liquids (Level 0) Thin liquids (Level 0): [facility] Penetration/Aspiration Scale Score: Penetration/Aspiration Scale Score 1.  Material does not enter airway: Thin liquids (Level 0); Mildly thick liquids (Level 2, nectar thick); Puree; Solid; Pill Compensatory Strategies: No data recorded  General Information: Caregiver present: Yes  Diet Prior to this Study: Regular; Thin liquids (Level 0)   Temperature : Normal   Respiratory Status: WFL   Supplemental O2: Nasal cannula   History of Recent Intubation: No  Behavior/Cognition: Alert; Cooperative Self-Feeding Abilities: Able to self-feed Baseline vocal quality/speech:  Normal Volitional Cough: Able to elicit Volitional Swallow: Able to elicit Exam Limitations: No limitations Goal Planning: No data recorded No data recorded No data recorded No data recorded No data recorded Pain: Pain Assessment Pain Assessment: No/denies pain End of Session: Start Time:SLP Start Time (ACUTE ONLY): 0930 Stop Time: SLP Stop Time (ACUTE ONLY): 0945 Time Calculation:SLP Time Calculation (min) (ACUTE ONLY): 15 min Charges: SLP Evaluations $ SLP Speech Visit: 1 Visit SLP Evaluations $MBS Swallow: 1 Procedure SLP visit diagnosis: SLP Visit Diagnosis: Dysphagia, unspecified (R13.10) Past Medical History: Past Medical History: Diagnosis Date  Bradycardia   SR/SB with up to 3.5 second pause (asymptomatic) on ~ 10/2017 event monitor; saw EP Dr. Nunzio Belch: avoid AV nodal blocking agents  Chronic kidney disease   COPD (chronic obstructive pulmonary disease) (HCC)   Depression   Depression with anxiety 08/29/2021  Diabetes mellitus   Dysarthria, post-stroke   Dysrhythmia   Ganglion cyst 03/09/2012  History of kidney stones   Hyperlipidemia   Hypertension   Hypokalemia   Myocardial infarction (HCC)   NSTEMI  Obesity, unspecified 12/17/2011  Pneumonia   Pneumonia of left lower lobe due to infectious organism 12/12/2023  PONV (postoperative nausea and vomiting)   Skin lesion of face 11/17/2022  Stroke [facility])   2019   right side hemiparesis Past Surgical History: Past Surgical History: Procedure Laterality Date  CESAREAN SECTION    x 3  LEFT HEART CATH AND CORONARY ANGIOGRAPHY N/A 12/28/2023  Procedure: LEFT HEART CATH AND CORONARY ANGIOGRAPHY;  Surgeon: Cody Das, MD;  Location: [facility] INVASIVE CV LAB;  Service: Cardiovascular;  Laterality: N/A;  ORIF WRIST FRACTURE Right 05/04/2018  Procedure: OPEN REDUCTION INTERNAL FIXATION (ORIF) RIGHT  WRIST FRACTURE;  Surgeon: Micheline Ahr, MD;  Location: [facility] OR;  Service: Orthopedics;  Laterality: Right;  TUMOR REMOVAL    left shoulder Delsa Fife MA, [name] McCoy Leah [name]  01/04/2024, 10:21 AM  DG Chest Port 1 View Result Date: 01/03/2024 CLINICAL DATA:  [id number] S/P bronchoscopy with biopsy [id number]. EXAM: PORTABLE CHEST 1 VIEW COMPARISON:  12/30/2023. FINDINGS: Low lung volume. Mild diffuse pulmonary vascular congestion,, improved since the prior study. Redemonstration of heterogeneous left retrocardiac opacity, grossly similar to the prior study. Please refer to CT scan chest from 12/31/2023 for details. Bilateral lung fields are otherwise clear. Bilateral costophrenic angles are clear.  No pneumothorax. Stable cardio-mediastinal silhouette. No acute osseous abnormalities. The soft tissues are within normal limits. IMPRESSION: 1. No pneumothorax. 2. Mild diffuse pulmonary vascular congestion, improved since the prior study. 3. Redemonstration of heterogeneous left retrocardiac opacity, grossly similar to the prior study. Electronically Signed   By: Beula Brunswick M.D.   On: 01/03/2024 15:44   DG C-ARM BRONCHOSCOPY Result Date: 01/03/2024 C-ARM BRONCHOSCOPY: Fluoroscopy was utilized by the requesting physician.  No radiographic interpretation.   US  RENAL Result Date: 12/31/2023 CLINICAL DATA:  LEFT renal calculus EXAM: RENAL / URINARY TRACT ULTRASOUND COMPLETE COMPARISON:  Renal stone protocol CT 12/12/2023 FINDINGS: Right Kidney: Renal measurements: 6.8 x 3.6 x 3.1 cm = volume: 36 mL. Echogenicity within normal limits. No mass or hydronephrosis visualized. Left Kidney: Renal measurements: 9.9 x 6.1 x 3.8 cm = volume: 119 mL. Echogenicity within normal limits. No hydronephrosis. Nonobstructing calculi again seen within the renal pelvis measuring 11 mm and within the lower pole calyx measuring 9 mm. Bladder: Appears normal for degree of bladder distention. Other: None. IMPRESSION: 1. No hydronephrosis. 2. Nonobstructing calculi seen within the LEFT renal pelvis and lower pole calyx. Electronically Signed   By: Elester Grim M.D.   On: 12/31/2023 16:31   CT CHEST WO  CONTRAST Result Date: 12/31/2023 CLINICAL DATA:  Pulmonary hypertension, respiratory illness, shortness of breath, left lower lobe consolidation * Tracking Code: BO * EXAM: CT CHEST WITHOUT CONTRAST TECHNIQUE: Multidetector CT imaging of the chest was performed following the standard protocol without IV contrast. RADIATION DOSE REDUCTION: This exam was performed according to the departmental dose-optimization program which includes automated exposure control, adjustment of the mA and/or kV according to patient size and/or use of iterative reconstruction technique. COMPARISON:  Chest radiograph, 12/30/2023 FINDINGS: Cardiovascular: Aortic atherosclerosis. Normal heart size. Three-vessel coronary artery calcifications. No pericardial effusion. Mediastinum/Nodes: Prominent mediastinal lymph nodes, pretracheal nodes measuring up to 1.4 x 1.4 cm. Thyroid gland, trachea, and esophagus demonstrate no significant findings. Lungs/Pleura: Dense, masslike consolidation of the dependent left lower lobe measuring up to 6.1 x 3.9 cm with internal air bronchograms (series 4, image 93). Mild interlobular septal thickening in the lung bases. Small left pleural effusion. Scattered areas of bandlike scarring in both lung bases. Upper Abdomen: No acute abnormality. Musculoskeletal: No chest wall abnormality. No acute osseous findings. IMPRESSION: 1. Dense, masslike consolidation of the dependent left lower lobe measuring up to 6.1 x 3.9 cm with internal air bronchograms. This may reflect infection or aspiration but is generally somewhat worrisome for malignancy. Recommend short interval follow-up to ensure resolution. 2. Mild interlobular septal thickening in the lung bases, consistent with mild pulmonary edema. 3. Small left pleural effusion. 4. Prominent mediastinal lymph nodes, likely reactive. Attention on follow-up. 5. Coronary artery disease. Aortic Atherosclerosis (ICD10-I70.0). Electronically Signed   By: Fredricka Jenny M.D.    On: 12/31/2023 11:57   DG Chest 2 View Result Date: 12/30/2023 CLINICAL DATA:  cp EXAM: CHEST - 2 VIEW COMPARISON:  Dec 12, 2023 FINDINGS: Diffuse interstitial opacities throughout the lungs. Patchy airspace opacities in the right suprahilar upper lobe and the left lung, mostly in the left lower lobe. No pleural effusion or pneumothorax. Mild cardiomegaly. Aortic atherosclerosis. No acute fracture or destructive lesion. Multilevel thoracic osteophytosis. IMPRESSION: Cardiomegaly with findings of either asymmetric pulmonary edema or multifocal pneumonia. Given the persistence of the masslike consolidation in the left lower lobe on the prior CT of the abdomen and pelvis, a follow-up chest CT with IV contrast is recommended. Electronically  Signed   By: Rance Burrows M.D.   On: 12/30/2023 11:26   CARDIAC CATHETERIZATION Result Date: 12/28/2023 Images from the original result were not included. Coronary angiography 12/28/2023: LM: No significant disease LAD: Moderate sized vessel.          Mid vessel occlusion with moderate calcification, after a large septal branch          Distal vessel filled with collaterals from septal branches Lcx: Moderate sized vessel         Small OM1 with prox 70% stenosis         Medium caliber OM2 with prox occlusion         Distal vessel filled with collaterals from septal branches RCA: Small to medium caliber dominant vessel with prox diffuse 50% disease           Mid vessel occlusion           Distal RCA/RPDA filled with collaterals from septal branches LVEDP 18 mmHg Extremely complex situation in this patient with multiple comorbidities including prior stroke with right-sided hemiparesis, diabetes.  Her shortness of breath is new since her recent hospitalization when she had non-STEMI in the setting of urosepsis due to staghorn calculus that requires definitive surgery at some point by urology, pending cardiac risk stratification and optimization.  Echocardiogram showed inferior  akinesis but preserved LVEF around 55%.  Stress test showed no infarct, but anterior ischemia with stress EF 44%.  Coronary angiogram today shows 3 distinct occlusions of mid LAD, proximal OM 2, mid RCA with faint collateralization. In addition, she has at least isorhythmic A-V dissociation, if not intermittent complete AV block on EKG.  I suspect this is also ischemic in etiology.  She is hemodynamically stable at this time. Ideally, her coronary anatomy is suitable for surgical revascularization.  However, multiple comorbidities could limit surgical candidacy.  I would like to discuss her case in multidisciplinary heart team meeting for further decision-making. I will obtain a twelve-lead EKG.  She had similar appearance on EKG last week in the office as well.  She is hemodynamically stable from her A-V dissociation.  I will consider discharging home on a monitor, versus inpatient EP consult, after obtaining the EKG. Cody Das, MD  MYOCARDIAL PERFUSION/CT RAD READ Result Date: 12/20/2023 CLINICAL DATA:  This over-read does not include interpretation of cardiac or coronary anatomy or pathology. The cardiac SPECT CT interpretation by the cardiologist is attached. COMPARISON:  CT chest May 16, 2019 FINDINGS: Vascular: Aortic atherosclerosis. Mediastinum/Nodes: Stable prominent/mildly enlarged lymph nodes for instance a right paratracheal lymph node measuring 13 mm in short axis on image 22/2, unchanged. Lungs/Pleura: Irregular nodular consolidation with bronchial wall thickening in the left lower lobe, suggest further evaluation by dedicated chest CT. Upper Abdomen: Cholelithiasis. Musculoskeletal: Multilevel degenerative change of the spine. IMPRESSION: 1. Irregular nodular consolidation with bronchial wall thickening in the left lower lobe, suggest further evaluation by dedicated chest CT. 2. Stable prominent/mildly enlarged mediastinal lymph nodes, nonspecific but favored reactive. 3.  Cholelithiasis. 4.  Aortic Atherosclerosis (ICD10-I70.0). Electronically Signed   By: Tama Fails M.D.   On: 12/20/2023 12:52   MYOCARDIAL PERFUSION IMAGING Result Date: 12/16/2023   Findings are consistent with ischemia. The study is high risk.   No ST deviation was noted. The ECG was not diagnostic due to pharmacologic protocol.   LV perfusion is abnormal. There is evidence of ischemia. There is no evidence of infarction. Defect 1: There is a large defect with severe reduction  in uptake present in the apical to basal anterior, anterolateral, anteroseptal, lateral and apex location(s) that is reversible. Viability is present. There is abnormal wall motion in the defect area. Consistent with ischemia.   Left ventricular function is abnormal. Global function is moderately reduced. Nuclear stress EF: 44%. The left ventricular ejection fraction is moderately decreased (30-44%). End diastolic cavity size is mildly enlarged. End systolic cavity size is normal.   CT images were obtained for attenuation correction and were examined for the presence of coronary calcium  when appropriate.   Coronary calcium  was present on the attenuation correction CT images. Severe coronary calcifications were present. Coronary calcifications were present in the left anterior descending artery distribution(s) and to a lesser extent in the LCX and RCA. Aortic atherosclerosis was also noted.   Prior study not available for comparison.   ECG rhythm shows normal sinus rhythm. Large size, severe severity reversible anterior, anteroseptal, anterolateral, lateral and apical perfusion defect, suggestive of LAD territory/diagonal ischemia (SDS 9). LVEF 44% with severe hypokinesis corresponding to the areas of perfusion defect. CT imaging demonstrates multivessel coronary calcium  with severe LAD and diagonal branch calcification. Overall this is a high risk study given the size of the perfusion defect. No prior study available for comparison.  Definitive cardiac catheterization is recommended.    Time coordinating discharge: 55 mins  SIGNED:  Unk Garb, DO [facility] 01/14/24, 9:51 AM

## 2024-01-15 NOTE — Progress Notes (Signed)
 The proposed treatment discussed in conference is for discussion purpose only and is not a binding recommendation.  The patients have not been physically examined, or presented with their treatment options.  Therefore, final treatment plans cannot be decided.

## 2024-01-17 ENCOUNTER — Telehealth: Payer: Self-pay | Admitting: Internal Medicine

## 2024-01-17 ENCOUNTER — Telehealth: Payer: Self-pay

## 2024-01-17 LAB — CULTURE, BLOOD (ROUTINE X 2)
Culture: NO GROWTH
Culture: NO GROWTH

## 2024-01-17 NOTE — Telephone Encounter (Signed)
 Husband calling because pt's appt on 01/14/24 was cx cause she went to hospital. While in hospital was referred to HF Clinic. Does she need to r.s with us  or just do the HF Clinic. Please advise

## 2024-01-17 NOTE — Transitions of Care (Post Inpatient/ED Visit) (Signed)
 01/17/2024  Name: Kelly Gibson MRN: [id number] DOB: April 09, 1961  Today's TOC FU Call Status: Today's TOC FU Call Status:: Successful TOC FU Call Completed TOC FU Call Complete Date: 01/17/24 Patient's Name and Date of Birth confirmed.  Transition Care Management Follow-up Telephone Call Date of Discharge: 01/14/24 Discharge Facility: [name]) Type of Discharge: Inpatient Admission Primary Inpatient Discharge Diagnosis:: Acute pulmonary embolism without acute cor pulmonale (HCC)  Lung cancer (HCC) How have you been since you were released from the hospital?: Better Any questions or concerns?: No  Items Reviewed: Did you receive and understand the discharge instructions provided?: Yes Medications obtained,verified, and reconciled?: Yes (Medications Reviewed) Any new allergies since your discharge?: No Dietary orders reviewed?: Yes Type of Diet Ordered:: heart healty low NA Do you have support at home?: Yes People in Home [RPT]: spouse Name of Support/Comfort Primary Source: Neville, Pauls (Spouse)  [phone] (Mobile)  Medications Reviewed Today: Medications Reviewed Today     Reviewed by Carolee Heron NOVAK, RN (Case Manager) on 01/17/24 at 1110  Med List Status: <None>   Medication Order Taking? Sig Documenting Provider Last Dose Status Informant  albuterol  (PROVENTIL ) (2.5 MG/3ML) 0.083% nebulizer solution [id number] Yes Inhale 3 mLs (2.5 mg total) into the lungs every 6 (six) hours as needed for wheezing or shortness of breath.  Patient taking differently: Inhale 3 mLs (2.5 mg total) into the lungs every 6 (six) hours as needed for wheezing or shortness of breath.   Darci Pore, MD  Active Spouse/Significant Other, Pharmacy Records  albuterol  (VENTOLIN  HFA) [phone] Base) MCG/ACT inhaler [id number] Yes Inhale 2 puffs into the lungs every 6 (six) hours as needed for wheezing or shortness of breath. Perri DELENA Meliton Mickey., MD  Active Spouse/Significant Other,  Pharmacy Records  APIXABAN  (ELIQUIS ) VTE STARTER PACK (10MG  AND 5MG ) [id number] Yes Take as directed on package: start with two-5mg  tablets twice daily for 7 days. On day 8, switch to one-5mg  tablet twice daily. Laurence Locus, DO  Active   aspirin  EC 81 MG tablet [id number] Yes Take 1 tablet (81 mg total) by mouth daily. Swallow whole. Perri DELENA Meliton Mickey., MD  Active Spouse/Significant Other, Pharmacy Records  budesonide -glycopyrrolate -formoterol  (BREZTRI  AEROSPHERE) 160-9-4.8 MCG/ACT AERO inhaler [id number] Yes Inhale 2 puffs into the lungs 2 (two) times daily. Perri DELENA Meliton Mickey., MD  Active Spouse/Significant Other, Pharmacy Records  citalopram  (CELEXA ) 10 MG tablet [id number] Yes TAKE 1 TABLET BY MOUTH DAILY Early, Sara E, NP  Active Spouse/Significant Other, Pharmacy Records  Continuous Glucose Sensor (DEXCOM G7 SENSOR) MISC [id number] Yes Take 1 Device by mouth See admin instructions. Change sensor every 10 days [provider]  Active Spouse/Significant Other, Pharmacy Records  Doxylamine  Succinate, Sleep, (UNISOM  PO) [id number] Yes Take 2 each by mouth at bedtime. Gel capsules [provider]  Active Spouse/Significant Other, Pharmacy Records  furosemide  (LASIX ) 20 MG tablet [id number] Yes Take 1 tablet (20 mg total) by mouth daily as needed for fluid or edema (take if daily weight is greater than 2 lbs from the previous day's weight.). Laurence Locus, DO  Active   HUMALOG  KWIKPEN 100 UNIT/ML KwikPen [id number] Yes INJECT 3 TO 5 UNITS INTO THE SKIN DAILY BEFORE LUNCH Early, Sara E, NP  Active Spouse/Significant Other, Pharmacy Records  hydrALAZINE  (APRESOLINE ) 100 MG tablet [id number] Yes 1 tablet up to every 12 hours only as needed for blood pressure >150/90 Early, Sara E, NP  Active Spouse/Significant Other, Pharmacy Records  insulin  degludec (TRESIBA  FLEXTOUCH) 100 UNIT/ML FlexTouch Pen [id number]  Yes Inject 10 Units into the skin 2 (two) times daily. Adjust by 2 units every 3 days as  needed for blood sugars higher than 150 in the morning fasting. Early, Sara E, NP  Active Spouse/Significant Other, Pharmacy Records           Med Note STEFFI NIAN   Thu Dec 23, 2023 11:16 AM)    Melatonin 5 MG CHEW [id number] Yes Chew 10 mg by mouth at bedtime. [provider]  Active Spouse/Significant Other, Pharmacy Records  Multiple Vitamins-Minerals (MULTIVITAMIN GUMMIES WOMENS) CICERO [id number] Yes Chew 2 each by mouth in the morning. [provider]  Active Spouse/Significant Other, Pharmacy Records  olmesartan (BENICAR) 40 MG tablet [id number] Yes Take 40 mg by mouth in the morning. [provider]  Active Spouse/Significant Other, Pharmacy Records  rosuvastatin  (CRESTOR ) 40 MG tablet [id number] Yes TAKE 1 TABLET BY MOUTH DAILY Early, Sara E, NP  Active Spouse/Significant Other, Pharmacy Records  UNABLE TO FIND [id number] Yes Take 1 tablet by mouth with breakfast, with lunch, and with evening meal. GlucoGold -Berberine, Concentrated Cinnamon, Chromium, Banaba Leaf Extract [provider]  Active Spouse/Significant Other, Pharmacy Records  Med List Note STEFFI NIAN, CPhT 12/23/23 1134): Husband manages medications             Home Care and Equipment/Supplies:No new home Health or DME, has all from last discharge.  DME at home: BSC/3in1;Wheelchair - manual; Shower seat; Toilet riser; Adaptive equipment (hemi-walker). From PT notes.  Were Home Health Services Ordered?: No Any new equipment or medical supplies ordered?:  (Patient pending a home nebulizer from [facility] and has a 01/27/24 PCP Pharm outreach scheduled if not received.)  Functional Questionnaire: Do you need assistance with bathing/showering or dressing?: Yes (Spouse is assisting with all needs related to Functional Status and ADL/s where needed.) Do you need assistance with meal preparation?: Yes (Spouse is assisting with all needs related to Functional Status and ADL/s  where needed.) Do you need assistance with eating?: No Do you have difficulty maintaining continence: No Do you need assistance with getting out of bed/getting out of a chair/moving?: Yes (Spouse is assisting with all needs related to Functional Status and ADL/s where needed.) Do you have difficulty managing or taking your medications?: No (Spouse is assisting with all needs related to Functional Status and ADL/s where needed.)  Follow up appointments reviewed: PCP Follow-up appointment confirmed?: No (Patient has been on contact with PCP office and pending a return call today or will call gain. Declined RN CM to contact Care Guide to assist.) Specialist Hospital Follow-up appointment confirmed?: Yes Date of Specialist follow-up appointment?: 01/21/24 Follow-Up Specialty Provider:: Harrell LIghtfoot 1110 am; [facility]. Do you need transportation to your follow-up appointment?: No Do you understand care options if your condition(s) worsen?: Yes-patient verbalized understanding (Reviewed with spouse, he has AVS with all discharge instructions from most recent discharge, what and when to call provider or EMS. Verbalized understanding of when to give meds, call for weight gain, importance of follow up with PCP and specialists.)  SDOH Interventions Today    Flowsheet Row Most Recent Value  SDOH Interventions   Food Insecurity Interventions Intervention Not Indicated  Housing Interventions Intervention Not Indicated  Transportation Interventions Intervention Not Indicated  Social Connections Interventions Community Resources Provided  Health Literacy Interventions Other (Comment), Intervention Not Indicated   01/17/24: Successful post discharge [facility] telephonic outreach today.  Spoke briefly with patient who stated I am was doing okay, better,  patient seemed alert to call and oriented to self, situation, some dysarthria noted (as per history of past CVA), then gave verbal permission  to speak to spouse for additional discussion/reviews.  Spouse manages most of patient care and is very engaged and knowledgeable demonstrating verbally understanding of medications and specific instructions for blood pressure, blood sugars, and daily weights listed in medications reconciliation/review.  Discharge instructions reviewed and patient/spouse has most recent AVS with all discharge instructions, when and what to contact provider for, heart failure instructions, medications, follow up appointments, and referrals.  Spouse put new batteries in scale this am but had not yet weight patient.  Monitors blood pressure and blood sugars; (patient has Dexcom G& sensor)as some medications are given depending on readings. (Spouse was helping patient to bathroom during call so could not retrieve recent/current readings).  Patient is waiting on home nebulizer machine, that per spouse, was to be delivered by [facility], but has not yet received by mail.  Encouraged to reach out to them or to PCP, Camie Doing of [facility], tor [facility] to ensure delivery.  Has a 01/27/24 phone outreach by PCP practice Pharmacy well noted and discussed.  Patient is also currently waiting on call back from PCP office on a questions and will make post discharge HFU appointment when he speaks to office again today, declining Care Guide assistance to schedule HFU at this time.   Reviewed specialist follow up appointments, some of which he is still waiting to hear back from, but has a follow with specialist on 01/21/24 with Dr Linnie Lightfoot/Cardiothoracic provider as listed above.  Ambulatory outpatient referral to CHF clinic made on discharge. [phone] [facility] Follow up with oncology as outpatient due to lung mass.  Follow up with Heme/oncology regarding duration of Eliquis  regime due to associated lung cancer per discharge notes.  Spouse assist  with all functional and ADL needs for patient, arranges and transports to appointments, will contact follow up referrals if do not hear from office, and manages medications.  Spouses understands how to contact insurance for any needs or additional benefits potentially available for patient.   Patient appreciated today's follow up call post discharge but declined 30 day program due to appointments and not being routinely available for weekly calls. RN CM gave name and call back number if that need should change or to ask PCP to refer to [facility] if need arises.    Bing Edison MSN, RN RN Case Sales executive Health  [facility] Office Hours M-F [phone] Direct Dial: [phone] Main Phone [phone]  Fax: [phone] [email or web address]

## 2024-01-17 NOTE — Telephone Encounter (Signed)
 Pts husband advised that the pt should see the surgeon 01/21/24... there is a referral for the HF clinic so she should be called to make an appt.    She will need to see gen cards but will wait to hear back from the surgeon.

## 2024-01-18 ENCOUNTER — Ambulatory Visit: Payer: Self-pay | Admitting: Nurse Practitioner

## 2024-01-18 ENCOUNTER — Inpatient Hospital Stay: Admitting: Acute Care

## 2024-01-18 DIAGNOSIS — J449 Chronic obstructive pulmonary disease, unspecified: Secondary | ICD-10-CM | POA: Diagnosis not present

## 2024-01-19 NOTE — Assessment & Plan Note (Signed)
 Chronically managed with albuterol  and breztri . Is in need of a new nebulizer. Will work to order this for her. Wheezing present today, but no other concerning symptoms. No distress. Will continue to monitor.

## 2024-01-19 NOTE — Assessment & Plan Note (Signed)
 She recently experienced a myocardial infarction and was transported back to [place] . CABG is recommended due to the risk of another myocardial infarction. She expressed fear about the surgery, particularly the risk of not surviving, but understands the potential benefits outweigh the risks. The surgery is considered routine, with crucial post-operative care. She is considering the surgery and has an upcoming appointment with the cardiologist to discuss further. Post-operative recovery in a nursing facility is recommended for 4-6 weeks to ensure proper care and rehabilitation. - Discuss CABG with cardiologist at upcoming appointment - Consider post-operative care in a nursing facility for 4-6 weeks for recovery

## 2024-01-19 NOTE — Assessment & Plan Note (Signed)
 Chronically managed with humalog  TID and insulin  degludec (tresiba ) once daily. Wearing dexcom for CGM. No reported lows. A1c estimated at 7.3% on meter. Given her recent hospitalization and stress, I suspect her numbers may be a little higher. Will monitor labs.

## 2024-01-19 NOTE — Assessment & Plan Note (Signed)
 In the setting of recent kidney stones and MI, suspect kidneys have taken a hit. Will monitor labs today.

## 2024-01-19 NOTE — Assessment & Plan Note (Signed)
 Managed with rosuvastatin . No alarms at this time. Continue treatment.

## 2024-01-19 NOTE — Assessment & Plan Note (Signed)
>>  ASSESSMENT AND PLAN FOR TYPE 2 DIABETES MELLITUS WITH DIABETIC NEUROPATHY, WITH LONG-TERM CURRENT USE OF INSULIN  (HCC) WRITTEN ON 01/19/2024  5:41 PM BY Vanderbilt Ranieri E, NP  Chronically managed with humalog  TID and insulin  degludec (tresiba ) once daily. Wearing dexcom for CGM. No reported lows. A1c estimated at 7.3% on meter. Given her recent hospitalization and stress, I suspect her numbers may be a little higher. Will monitor labs.

## 2024-01-19 NOTE — Assessment & Plan Note (Signed)
 Her blood pressure is well-controlled on amlodipine  and olmesartan. Hydralazine  was previously added but is not necessary at this time due to good blood pressure control. Hydralazine  can be used as needed for acute blood pressure spikes above 150/95 mmHg. - Continue amlodipine  and olmesartan - Use hydralazine  as needed for blood pressure spikes above 150/95 mmHg

## 2024-01-19 NOTE — Assessment & Plan Note (Signed)
 Chronic. Bound to wheelchair for transit. Recommend close monitoring of skin.

## 2024-01-19 NOTE — Assessment & Plan Note (Addendum)
 Managed with citalopram  The emotional impact of the recent cardiac events and potential surgery is acknowledged. Post-operative emotional fluctuations are anticipated and should be monitored. - Ensure refill of depression medication is processed

## 2024-01-21 ENCOUNTER — Ambulatory Visit
Attending: Thoracic Surgery (Cardiothoracic Vascular Surgery) | Admitting: Thoracic Surgery (Cardiothoracic Vascular Surgery)

## 2024-01-21 ENCOUNTER — Encounter: Payer: Self-pay | Admitting: Nurse Practitioner

## 2024-01-21 ENCOUNTER — Encounter: Payer: Self-pay | Admitting: Thoracic Surgery (Cardiothoracic Vascular Surgery)

## 2024-01-21 ENCOUNTER — Ambulatory Visit (INDEPENDENT_AMBULATORY_CARE_PROVIDER_SITE_OTHER): Admitting: Nurse Practitioner

## 2024-01-21 VITALS — BP 151/64 | HR 53 | Resp 20 | Ht 63.0 in | Wt 190.8 lb

## 2024-01-21 VITALS — BP 110/68 | HR 51

## 2024-01-21 DIAGNOSIS — I129 Hypertensive chronic kidney disease with stage 1 through stage 4 chronic kidney disease, or unspecified chronic kidney disease: Secondary | ICD-10-CM

## 2024-01-21 DIAGNOSIS — I2510 Atherosclerotic heart disease of native coronary artery without angina pectoris: Secondary | ICD-10-CM | POA: Diagnosis not present

## 2024-01-21 DIAGNOSIS — R319 Hematuria, unspecified: Secondary | ICD-10-CM

## 2024-01-21 DIAGNOSIS — E1169 Type 2 diabetes mellitus with other specified complication: Secondary | ICD-10-CM | POA: Diagnosis not present

## 2024-01-21 DIAGNOSIS — E1122 Type 2 diabetes mellitus with diabetic chronic kidney disease: Secondary | ICD-10-CM

## 2024-01-21 DIAGNOSIS — N1832 Chronic kidney disease, stage 3b: Secondary | ICD-10-CM

## 2024-01-21 DIAGNOSIS — N1831 Chronic kidney disease, stage 3a: Secondary | ICD-10-CM | POA: Diagnosis not present

## 2024-01-21 DIAGNOSIS — I509 Heart failure, unspecified: Secondary | ICD-10-CM

## 2024-01-21 DIAGNOSIS — C3432 Malignant neoplasm of lower lobe, left bronchus or lung: Secondary | ICD-10-CM | POA: Diagnosis not present

## 2024-01-21 DIAGNOSIS — I495 Sick sinus syndrome: Secondary | ICD-10-CM

## 2024-01-21 DIAGNOSIS — N200 Calculus of kidney: Secondary | ICD-10-CM

## 2024-01-21 DIAGNOSIS — F325 Major depressive disorder, single episode, in full remission: Secondary | ICD-10-CM

## 2024-01-21 DIAGNOSIS — I69351 Hemiplegia and hemiparesis following cerebral infarction affecting right dominant side: Secondary | ICD-10-CM | POA: Diagnosis not present

## 2024-01-21 DIAGNOSIS — E785 Hyperlipidemia, unspecified: Secondary | ICD-10-CM

## 2024-01-21 DIAGNOSIS — E1165 Type 2 diabetes mellitus with hyperglycemia: Secondary | ICD-10-CM | POA: Diagnosis not present

## 2024-01-21 NOTE — Progress Notes (Unsigned)
 Kelly Doing, DNP, AGNP-c [facility]  [address] [place], [place] [address] [phone]  ESTABLISHED PATIENT- Chronic Health and/or Follow-Up Visit  Blood pressure 110/68, pulse (!) 51, last menstrual period 12/10/2012, SpO2 95%.    History of Present Illness Kelly Gibson is a 63 year old female with new onset congestive heart failure and non-small cell lung cancer who presents for hospital follow-up.  She was recently hospitalized for new onset congestive heart failure with fluid volume overload. She experiences mild shortness of breath, which has improved since her hospital discharge. She uses an inhaler but had to go to the hospital when it did not alleviate her symptoms. She is currently on Lasix  and monitors her weight daily, with no significant weight gain noted since discharge.   She has a history of a nephrostomy tube placement for a large kidney stone. She notices blood in her urine for the last few mornings, which resolves later in the day, and describes a 'sting burn' sensation when urinating. She is currently taking Eliquis , which may contribute to the bleeding. She was previously on a higher dose of Eliquis  (10 mg twice daily) and has recently reduced to 5 mg twice daily.  She was informed of having cancer in her lung during the most recent hospitalization, after results from a bronchoscopy and fine needle aspiration during hospitalization earlier this month returned.  She is unsure of the exact diagnosis and is awaiting further evaluation from oncology. She reports no symptoms related to the cancer.  Her family history includes her father, who had lung cancer with liver involvement and passed away at 53. She is concerned about her own prognosis given her father's history.  She reports a low heart rate, sometimes dropping below 50, but it has improved with fluid management. She has three blocked arteries, but her heart has developed  collateral circulation. Her heart function is at 50%, slightly below the normal 60%.  She has been experiencing a lot of medical issues since mid-May, including a heart attack, and has been in and out of the hospital. She expresses frustration with the numerous medical consultations and the lack of a clear plan.   All ROS negative with exception of what is listed above.   PHYSICAL EXAM Physical Exam Vitals and nursing note reviewed.  Constitutional:      General: She is not in acute distress.    Appearance: Normal appearance. She is not ill-appearing.  HENT:     Head: Normocephalic.   Eyes:     Conjunctiva/sclera: Conjunctivae normal.   Neck:     Vascular: No carotid bruit.   Cardiovascular:     Rate and Rhythm: Regular rhythm. Bradycardia present.     Pulses: Normal pulses.     Heart sounds: Normal heart sounds.  Pulmonary:     Effort: Pulmonary effort is normal.     Breath sounds: Normal breath sounds.  Abdominal:     General: There is no distension.     Palpations: Abdomen is soft.     Tenderness: There is no abdominal tenderness. There is no right CVA tenderness, left CVA tenderness or guarding.  Lymphadenopathy:     Cervical: No cervical adenopathy.   Skin:    General: Skin is warm and dry.     Capillary Refill: Capillary refill takes less than 2 seconds.   Neurological:     Mental Status: She is alert and oriented to person, place, and time.     Sensory: Sensory deficit present.  Motor: Weakness present.     Coordination: Coordination abnormal.     Gait: Gait abnormal.     Deep Tendon Reflexes: Reflexes abnormal.     Comments: Right side paralysis following CVA in the past.   Psychiatric:        Mood and Affect: Mood normal.        Behavior: Behavior normal.      PLAN Problem List Items Addressed This Visit     Hemiparesis affecting right side as late effect of cerebrovascular accident (CVA) (HCC)   Chronic. No worsening symptoms. Difficulty in  obtaining urine specimen due to being [facility]      CKD stage 3a, GFR 45-59 ml/min (HCC) - Baseline Scr 1.2-1.5   Continue to monitor kidney function and increase water intake. Avoid dehydration.       Hyperlipidemia associated with type 2 diabetes mellitus (HCC)   Tight lipid control recommended for ongoing protection of the heart.       Hypertension associated with stage 3b chronic kidney disease due to type 2 diabetes mellitus (HCC)   Stable at this time with no alarm symptoms.       Major depression in remission (HCC)   Concerns over exacerbation of depression given significant new diagnoses in a short period of time. In a little over 2 months she has been diagnosed with a MI, CHF, sick sinus, and lung cancer. She is also in need of cardiac bypass. Will monitor closely. Encourage positive outlook and taking this one step at a time. Overall health management encouraged to reduce risks.       Sick sinus syndrome (HCC)   Noted during recent hospitalization. Discussed that pacemaker may be an option based on the severity and recommendations from cardiology. HR bradycardic, but regular in the office today. No alarm symptoms present.       Lung cancer (HCC) - Primary   Diagnosed following bronchoscopy and fine needle aspiration of the left lower lobe. Likely in Kelly Gibson stages with no significant symptoms. We discussed that non-small cell lung cancer is generally slower growing and spreading compared to small cell lung cancer, which is favorable for prognosis. Kelly Gibson detection is promising, with potential treatment options including surgical resection or targeted radiation. Reassurance provided that oncology will work with her to help find the most appropriate treatment regimen based on her medical history. - Follow up with oncology for further evaluation and treatment planning. - Attend scheduled lab work and oncology appointment on July 3rd for further testing and discussion of treatment options.       Uncontrolled type 2 diabetes mellitus with hyperglycemia, with long-term current use of insulin  (HCC)   Continue with Humalog  and Tresiba  as directed. No changes to dosing at this time.       Congestive heart failure (HCC)   Recently hospitalized for new onset congestive heart failure with fluid volume overload. Currently experiencing mild dyspnea, but symptoms have improved. Ejection fraction is 50%, slightly below normal but not critical. Collateral circulation has developed to compensate for blocked arteries. Fluid management has improved cardiac and renal function. Occasional bradycardia with heart rate in the 50s, not currently concerning. Pacemaker placement is a potential future consideration if bradycardia persists. - Monitor weight daily to track fluid retention. - Adjust Lasix  dosage to manage fluid volume. - Monitor for signs of fluid overload, such as significant weight gain or increased dyspnea.      Hematuria   Intermittent hematuria, possibly related to nephrolithiasis or as a side  effect of Eliquis . Blood appears in the morning and resolves later in the day. Absence of significant pain or dysuria. Recent dosage adjustment of Eliquis  from 10mg  BID to 5mg  BID may reduce bleeding. - Monitor urine color and report significant changes or persistent bleeding. - Consider initiating low-dose antibiotic if urinary tract infection symptoms develop, such as odor or increased dysuria. - Restart previous antibiotics if symptoms worsen, and notify healthcare provider.       Nephrolithiasis   Large kidney stone with nephrostomy tube placed. Currently not causing obstruction but may contribute to hematuria. No current intervention planned due to prioritization of other health issues. The stone is not obstructive and is not an immediate concern. - Reassess need for intervention after addressing more urgent health concerns.       No follow-ups on file.  Kelly Nazanin Kinner, DNP,  AGNP-c Time: 75 minutes, >50% spent counseling, care coordination, chart review, and documentation.

## 2024-01-21 NOTE — Patient Instructions (Signed)
 If needed, restart the antibiotic once in the morning and once in the evening. Then let me know if you start it.   Keep watching your weight.

## 2024-01-24 DIAGNOSIS — I509 Heart failure, unspecified: Secondary | ICD-10-CM | POA: Insufficient documentation

## 2024-01-24 DIAGNOSIS — R319 Hematuria, unspecified: Secondary | ICD-10-CM | POA: Insufficient documentation

## 2024-01-24 DIAGNOSIS — N200 Calculus of kidney: Secondary | ICD-10-CM | POA: Insufficient documentation

## 2024-01-24 HISTORY — DX: Hematuria, unspecified: R31.9

## 2024-01-24 NOTE — Assessment & Plan Note (Signed)
 Large kidney stone with nephrostomy tube placed. Currently not causing obstruction but may contribute to hematuria. No current intervention planned due to prioritization of other health issues. The stone is not obstructive and is not an immediate concern. - Reassess need for intervention after addressing more urgent health concerns.

## 2024-01-24 NOTE — Assessment & Plan Note (Signed)
 Recently hospitalized for new onset congestive heart failure with fluid volume overload. Currently experiencing mild dyspnea, but symptoms have improved. Ejection fraction is 50%, slightly below normal but not critical. Collateral circulation has developed to compensate for blocked arteries. Fluid management has improved cardiac and renal function. Occasional bradycardia with heart rate in the 50s, not currently concerning. Pacemaker placement is a potential future consideration if bradycardia persists. - Monitor weight daily to track fluid retention. - Adjust Lasix  dosage to manage fluid volume. - Monitor for signs of fluid overload, such as significant weight gain or increased dyspnea.

## 2024-01-24 NOTE — Progress Notes (Signed)
     [address]       [place] [address]             [phone]       Mrs. Schwering presents for hospital follow-up for her coronary artery disease.  Unfortunately, she was diagnosed with NSCLC of the LLL.  This will take priority.  She was hesitant to proceed with CABG while in-house, and now this cancer diagnosis complicates things even further.  Based on it's location, it could not be removed at the time of CABG.  She would like to meet with medical oncology, but still feels like she would not be able to tolerate surgery.  Jourdin Gens MALVA Rayas

## 2024-01-24 NOTE — Assessment & Plan Note (Signed)
 Intermittent hematuria, possibly related to nephrolithiasis or as a side effect of Eliquis . Blood appears in the morning and resolves later in the day. Absence of significant pain or dysuria. Recent dosage adjustment of Eliquis  from 10mg  BID to 5mg  BID may reduce bleeding. - Monitor urine color and report significant changes or persistent bleeding. - Consider initiating low-dose antibiotic if urinary tract infection symptoms develop, such as odor or increased dysuria. - Restart previous antibiotics if symptoms worsen, and notify healthcare provider.

## 2024-01-24 NOTE — Assessment & Plan Note (Signed)
 Concerns over exacerbation of depression given significant new diagnoses in a short period of time. In a little over 2 months she has been diagnosed with a MI, CHF, sick sinus, and lung cancer. She is also in need of cardiac bypass. Will monitor closely. Encourage positive outlook and taking this one step at a time. Overall health management encouraged to reduce risks.

## 2024-01-24 NOTE — Assessment & Plan Note (Signed)
 Noted during recent hospitalization. Discussed that pacemaker may be an option based on the severity and recommendations from cardiology. HR bradycardic, but regular in the office today. No alarm symptoms present.

## 2024-01-24 NOTE — Assessment & Plan Note (Signed)
 Chronic. No worsening symptoms. Difficulty in obtaining urine specimen due to being [facility]

## 2024-01-24 NOTE — Assessment & Plan Note (Signed)
 Continue to monitor kidney function and increase water intake. Avoid dehydration.

## 2024-01-24 NOTE — Assessment & Plan Note (Signed)
 Diagnosed following bronchoscopy and fine needle aspiration of the left lower lobe. Likely in Kelly Gibson stages with no significant symptoms. We discussed that non-small cell lung cancer is generally slower growing and spreading compared to small cell lung cancer, which is favorable for prognosis. Gelisa Tieken detection is promising, with potential treatment options including surgical resection or targeted radiation. Reassurance provided that oncology will work with her to help find the most appropriate treatment regimen based on her medical history. - Follow up with oncology for further evaluation and treatment planning. - Attend scheduled lab work and oncology appointment on July 3rd for further testing and discussion of treatment options.

## 2024-01-24 NOTE — Assessment & Plan Note (Signed)
 Continue with Humalog  and Tresiba  as directed. No changes to dosing at this time.

## 2024-01-24 NOTE — Assessment & Plan Note (Signed)
 Stable at this time with no alarm symptoms.

## 2024-01-24 NOTE — Assessment & Plan Note (Signed)
 Tight lipid control recommended for ongoing protection of the heart.

## 2024-01-26 ENCOUNTER — Other Ambulatory Visit: Payer: Self-pay | Admitting: Internal Medicine

## 2024-01-26 DIAGNOSIS — C3400 Malignant neoplasm of unspecified main bronchus: Secondary | ICD-10-CM

## 2024-01-27 ENCOUNTER — Other Ambulatory Visit (INDEPENDENT_AMBULATORY_CARE_PROVIDER_SITE_OTHER)

## 2024-01-27 ENCOUNTER — Inpatient Hospital Stay: Attending: Internal Medicine | Admitting: Internal Medicine

## 2024-01-27 ENCOUNTER — Inpatient Hospital Stay

## 2024-01-27 VITALS — BP 127/75 | HR 55 | Temp 97.7°F | Resp 17 | Ht 63.0 in | Wt 190.0 lb

## 2024-01-27 DIAGNOSIS — C3432 Malignant neoplasm of lower lobe, left bronchus or lung: Secondary | ICD-10-CM | POA: Insufficient documentation

## 2024-01-27 DIAGNOSIS — Z87891 Personal history of nicotine dependence: Secondary | ICD-10-CM | POA: Insufficient documentation

## 2024-01-27 DIAGNOSIS — C3400 Malignant neoplasm of unspecified main bronchus: Secondary | ICD-10-CM

## 2024-01-27 DIAGNOSIS — Z794 Long term (current) use of insulin: Secondary | ICD-10-CM

## 2024-01-27 DIAGNOSIS — I129 Hypertensive chronic kidney disease with stage 1 through stage 4 chronic kidney disease, or unspecified chronic kidney disease: Secondary | ICD-10-CM | POA: Diagnosis not present

## 2024-01-27 DIAGNOSIS — N189 Chronic kidney disease, unspecified: Secondary | ICD-10-CM | POA: Diagnosis not present

## 2024-01-27 DIAGNOSIS — Z7901 Long term (current) use of anticoagulants: Secondary | ICD-10-CM | POA: Diagnosis not present

## 2024-01-27 DIAGNOSIS — I2699 Other pulmonary embolism without acute cor pulmonale: Secondary | ICD-10-CM | POA: Diagnosis not present

## 2024-01-27 DIAGNOSIS — R319 Hematuria, unspecified: Secondary | ICD-10-CM | POA: Insufficient documentation

## 2024-01-27 DIAGNOSIS — E1122 Type 2 diabetes mellitus with diabetic chronic kidney disease: Secondary | ICD-10-CM | POA: Diagnosis not present

## 2024-01-27 DIAGNOSIS — C3490 Malignant neoplasm of unspecified part of unspecified bronchus or lung: Secondary | ICD-10-CM

## 2024-01-27 DIAGNOSIS — E1165 Type 2 diabetes mellitus with hyperglycemia: Secondary | ICD-10-CM

## 2024-01-27 LAB — CBC WITH DIFFERENTIAL (CANCER CENTER ONLY)
Abs Immature Granulocytes: 0.02 10*3/uL (ref 0.00–0.07)
Basophils Absolute: 0.1 10*3/uL (ref 0.0–0.1)
Basophils Relative: 1 %
Eosinophils Absolute: 0.3 10*3/uL (ref 0.0–0.5)
Eosinophils Relative: 5 %
HCT: 38.1 % (ref 36.0–46.0)
Hemoglobin: 12.2 g/dL (ref 12.0–15.0)
Immature Granulocytes: 0 %
Lymphocytes Relative: 22 %
Lymphs Abs: 1.3 10*3/uL (ref 0.7–4.0)
MCH: 27 pg (ref 26.0–34.0)
MCHC: 32 g/dL (ref 30.0–36.0)
MCV: 84.3 fL (ref 80.0–100.0)
Monocytes Absolute: 0.6 10*3/uL (ref 0.1–1.0)
Monocytes Relative: 10 %
Neutro Abs: 3.6 10*3/uL (ref 1.7–7.7)
Neutrophils Relative %: 62 %
Platelet Count: 322 10*3/uL (ref 150–400)
RBC: 4.52 MIL/uL (ref 3.87–5.11)
RDW: 15.8 % — ABNORMAL HIGH (ref 11.5–15.5)
WBC Count: 5.8 10*3/uL (ref 4.0–10.5)
nRBC: 0 % (ref 0.0–0.2)

## 2024-01-27 LAB — CMP (CANCER CENTER ONLY)
ALT: 11 U/L (ref 0–44)
AST: 15 U/L (ref 15–41)
Albumin: 3.9 g/dL (ref 3.5–5.0)
Alkaline Phosphatase: 75 U/L (ref 38–126)
Anion gap: 9 (ref 5–15)
BUN: 20 mg/dL (ref 8–23)
CO2: 27 mmol/L (ref 22–32)
Calcium: 9.3 mg/dL (ref 8.9–10.3)
Chloride: 105 mmol/L (ref 98–111)
Creatinine: 1.56 mg/dL — ABNORMAL HIGH (ref 0.44–1.00)
GFR, Estimated: 38 mL/min — ABNORMAL LOW (ref >60)
Glucose, Bld: 146 mg/dL — ABNORMAL HIGH (ref 70–99)
Potassium: 4 mmol/L (ref 3.5–5.1)
Sodium: 141 mmol/L (ref 135–145)
Total Bilirubin: 0.4 mg/dL (ref 0.0–1.2)
Total Protein: 7.7 g/dL (ref 6.5–8.1)

## 2024-01-27 NOTE — Progress Notes (Signed)
 [facility] [phone]   [phone]  CONSULT NOTE  REFERRING PHYSICIAN: Dr. Lamar Chris  REASON FOR CONSULTATION:  63 years old African-American female recently diagnosed with lung cancer  HPI Kelly Gibson is a 63 y.o. female came to the clinic today for evaluation accompanied by her husband Kelly Gibson. Discussed the use of AI scribe software for clinical note transcription with the patient, who gave verbal consent to proceed.  History of Present Illness Kelly Gibson is a 63 year old female with recently diagnosed lung cancer who presents for management of her condition. She is accompanied by her husband, Kelly Gibson. She was referred by Dr. Chris for management of recently diagnosed lung cancer.  She began experiencing dyspnea on Dec 09, 2023, during a trip to [place] , [place]. Despite using inhalers, her symptoms persisted, leading to a hospital visit where she was diagnosed with a myocardial infarction. She was transferred to [facility] for further evaluation. A CT scan of the chest on December 31, 2023, revealed a left lower lobe lung mass measuring 6.1 by 3.9 cm and a pre-tracheal lymph node on the right side measuring 1.4 by 1.4 cm. A bronchoscopy on January 03, 2024, confirmed adenocarcinoma, a type of non-small cell lung cancer.  She also experienced gastrointestinal issues, prompting a CT angiogram that identified a thrombus in the left lower pulmonary arteries. She is currently on Eliquis , taken twice daily, to manage this condition. She has minor bleeding issues, specifically hematuria, which may be related to her history of nephrolithiasis.  Her breathing remains labored, and she occasionally experiences chest pain across her chest. No hemoptysis, sputum production, weight loss, nausea, vomiting, diarrhea, or headaches. The patient is married and has 3 daughters.  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  She has  a history of smoking for around 30 years but quit 20 years ago.  No history of alcohol or drug abuse  HPI  Past Medical History:  Diagnosis Date   Bradycardia    SR/SB with up to 3.5 second pause (asymptomatic) on ~ 10/2017 event monitor; saw EP Dr. Kelsie: avoid AV nodal blocking agents   Chronic kidney disease    COPD (chronic obstructive pulmonary disease) (HCC)    Depression    Depression with anxiety 08/29/2021   Diabetes mellitus    Dysarthria, post-stroke    Dysrhythmia    Ganglion cyst 03/09/2012   Heart block AV complete (HCC) 12/11/2023   History of kidney stones    Hyperlipidemia    Hypertension    Hypokalemia    Myocardial infarction [facility])    NSTEMI   Non-ST elevation (NSTEMI) myocardial infarction (HCC) 12/11/2023   Obesity, unspecified 12/17/2011   Pneumonia    Pneumonia of left lower lobe due to infectious organism 12/12/2023   PONV (postoperative nausea and vomiting)    Skin lesion of face 11/17/2022   Stroke [facility])    2019   right side hemiparesis     Past Surgical History:  Procedure Laterality Date   CESAREAN SECTION     x 3   LEFT HEART CATH AND CORONARY ANGIOGRAPHY N/A 12/28/2023   Procedure: LEFT HEART CATH AND CORONARY ANGIOGRAPHY;  Surgeon: Elmira Newman PARAS, MD;  Location: [facility] INVASIVE CV LAB;  Service: Cardiovascular;  Laterality: N/A;   ORIF WRIST FRACTURE Right 05/04/2018   Procedure: OPEN REDUCTION INTERNAL FIXATION (ORIF) RIGHT  WRIST FRACTURE;  Surgeon: Cristy Bonner DASEN, MD;  Location: [facility] OR;  Service:  Orthopedics;  Laterality: Right;   TUMOR REMOVAL     left shoulder   VIDEO BRONCHOSCOPY WITH ENDOBRONCHIAL NAVIGATION  01/03/2024   Procedure: VIDEO BRONCHOSCOPY WITH ENDOBRONCHIAL NAVIGATION;  Surgeon: Shelah Lamar RAMAN, MD;  Location: [facility] ENDOSCOPY;  Service: Pulmonary;;   VIDEO BRONCHOSCOPY WITH ENDOBRONCHIAL ULTRASOUND Bilateral 01/03/2024   Procedure: BRONCHOSCOPY, WITH EBUS;  Surgeon: Shelah Lamar RAMAN, MD;  Location: [facility] ENDOSCOPY;  Service:  Pulmonary;  Laterality: Bilateral;    Family History  Problem Relation Age of Onset   Diabetes Mother    Stroke Mother    Heart disease Father    Cancer Father    Stroke Father     Social History Social History   Tobacco Use   Smoking status: Former    Current packs/day: 1.00    Average packs/day: 1 pack/day for 20.0 years (20.0 ttl pk-yrs)    Types: Cigarettes   Smokeless tobacco: Never   Tobacco comments:    stopped 2018  Vaping Use   Vaping status: Never Used  Substance Use Topics   Alcohol use: No   Drug use: No    Allergies  Allergen Reactions   Latex Itching and Rash   Niacin Nausea And Vomiting    Current Outpatient Medications  Medication Sig Dispense Refill   albuterol  (PROVENTIL ) (2.5 MG/3ML) 0.083% nebulizer solution Inhale 3 mLs (2.5 mg total) into the lungs every 6 (six) hours as needed for wheezing or shortness of breath. 75 mL 12   albuterol  (VENTOLIN  HFA) 108 (90 Base) MCG/ACT inhaler Inhale 2 puffs into the lungs every 6 (six) hours as needed for wheezing or shortness of breath. 18 g 2   APIXABAN  (ELIQUIS ) VTE STARTER PACK (10MG  AND 5MG ) Take as directed on package: start with two-5mg  tablets twice daily for 7 days. On day 8, switch to one-5mg  tablet twice daily. 74 each 0   aspirin  EC 81 MG tablet Take 1 tablet (81 mg total) by mouth daily. Swallow whole. 30 tablet 12   budesonide -glycopyrrolate -formoterol  (BREZTRI  AEROSPHERE) 160-9-4.8 MCG/ACT AERO inhaler Inhale 2 puffs into the lungs 2 (two) times daily. 10.7 g 1   citalopram  (CELEXA ) 10 MG tablet TAKE 1 TABLET BY MOUTH DAILY 90 tablet 1   Continuous Glucose Sensor (DEXCOM G7 SENSOR) MISC Take 1 Device by mouth See admin instructions. Change sensor every 10 days     Doxylamine  Succinate, Sleep, (UNISOM  PO) Take 2 each by mouth at bedtime. Gel capsules     furosemide  (LASIX ) 20 MG tablet Take 1 tablet (20 mg total) by mouth daily as needed for fluid or edema (take if daily weight is greater than 2 lbs  from the previous day's weight.). 90 tablet 0   HUMALOG  KWIKPEN 100 UNIT/ML KwikPen INJECT 3 TO 5 UNITS INTO THE SKIN DAILY BEFORE LUNCH 6 mL 3   hydrALAZINE  (APRESOLINE ) 100 MG tablet 1 tablet up to every 12 hours only as needed for blood pressure >150/90     insulin  degludec (TRESIBA  FLEXTOUCH) 100 UNIT/ML FlexTouch Pen Inject 10 Units into the skin 2 (two) times daily. Adjust by 2 units every 3 days as needed for blood sugars higher than 150 in the morning fasting. 9 mL 11   Melatonin 5 MG CHEW Chew 10 mg by mouth at bedtime.     Multiple Vitamins-Minerals (MULTIVITAMIN GUMMIES WOMENS) CHEW Chew 2 each by mouth in the morning.     olmesartan (BENICAR) 40 MG tablet Take 40 mg by mouth in the morning.     rosuvastatin  (  CRESTOR ) 40 MG tablet TAKE 1 TABLET BY MOUTH DAILY 90 tablet 3   UNABLE TO FIND Take 1 tablet by mouth with breakfast, with lunch, and with evening meal. GlucoGold -Berberine, Concentrated Cinnamon, Chromium, Banaba Leaf Extract     No current facility-administered medications for this visit.    Review of Systems  Constitutional: positive for fatigue Eyes: negative Ears, nose, mouth, throat, and face: negative Respiratory: positive for cough and dyspnea on exertion Cardiovascular: negative Gastrointestinal: negative Genitourinary:negative Integument/breast: negative Hematologic/lymphatic: negative Musculoskeletal:positive for arthralgias and muscle weakness Neurological: negative Behavioral/Psych: negative Endocrine: negative Allergic/Immunologic: negative  Physical Exam  MJO:jozmu, healthy, no distress, well nourished, and well developed SKIN: skin color, texture, turgor are normal, no rashes or significant lesions HEAD: Normocephalic, No masses, lesions, tenderness or abnormalities EYES: normal, PERRLA, Conjunctiva are pink and non-injected EARS: External ears normal, Canals clear OROPHARYNX:no exudate, no erythema, and lips, buccal mucosa, and tongue normal   NECK: supple, no adenopathy, no JVD LYMPH:  no palpable lymphadenopathy, no hepatosplenomegaly BREAST:not examined LUNGS: clear to auscultation , and palpation HEART: regular rate & rhythm, no murmurs, and no gallops ABDOMEN:abdomen soft, non-tender, normal bowel sounds, and no masses or organomegaly BACK: Back symmetric, no curvature., No CVA tenderness EXTREMITIES:no joint deformities, effusion, or inflammation, no edema  NEURO: alert & oriented x 3 with fluent speech, no focal motor/sensory deficits  PERFORMANCE STATUS: ECOG 1  LABORATORY DATA: Lab Results  Component Value Date   WBC 5.8 01/27/2024   HGB 12.2 01/27/2024   HCT 38.1 01/27/2024   MCV 84.3 01/27/2024   PLT 322 01/27/2024      Chemistry      Component Value Date/Time   NA 141 01/27/2024 1401   NA 144 01/10/2024 1508   K 4.0 01/27/2024 1401   CL 105 01/27/2024 1401   CO2 27 01/27/2024 1401   BUN 20 01/27/2024 1401   BUN 27 01/10/2024 1508   CREATININE 1.56 (H) 01/27/2024 1401   CREATININE 1.12 (H) 05/22/2020 1211      Component Value Date/Time   CALCIUM  9.3 01/27/2024 1401   CALCIUM  10.0 08/18/2023 0000   ALKPHOS 75 01/27/2024 1401   AST 15 01/27/2024 1401   ALT 11 01/27/2024 1401   BILITOT 0.4 01/27/2024 1401       RADIOGRAPHIC STUDIES: ECHOCARDIOGRAM COMPLETE BUBBLE STUDY Result Date: 01/13/2024    ECHOCARDIOGRAM REPORT   Patient Name:   ODESTER NILSON Date of Exam: 01/13/2024 Medical Rec #:  [id number]         Height:       63.0 in Accession #:    [id number]        Weight:       191.1 lb Date of Birth:  1960/12/11        BSA:          1.897 m Patient Age:    62 years          BP:           151/56 mmHg Patient Gender: F                 HR:           54 bpm. Exam Location:  Inpatient Procedure: 2D Echo, Saline Contrast Bubble Study, Cardiac Doppler, Color Doppler            and Strain Analysis (Both Spectral and Color Flow Doppler were            utilized during procedure). Indications:  CVA   History:        Patient has prior history of Echocardiogram examinations, most                 recent 10/27/2017.  Sonographer:    Therisa Crouch Referring Phys: [id number] MARSHA ADA IMPRESSIONS  1. Left ventricular ejection fraction, by estimation, is 60 to 65%. The left ventricle has normal function. The left ventricle has no regional wall motion abnormalities. There is mild concentric left ventricular hypertrophy. Left ventricular diastolic parameters are consistent with Grade II diastolic dysfunction (pseudonormalization). The average left ventricular global longitudinal strain is -9.5 %. The global longitudinal strain is abnormal.  2. Right ventricular systolic function is normal. The right ventricular size is normal.  3. Left atrial size was moderately dilated.  4. The mitral valve is normal in structure. Mild mitral valve regurgitation. No evidence of mitral stenosis.  5. The aortic valve is normal in structure. There is moderate calcification of the aortic valve. Aortic valve regurgitation is not visualized. No aortic stenosis is present.  6. The inferior vena cava is normal in size with greater than 50% respiratory variability, suggesting right atrial pressure of 3 mmHg. FINDINGS  Left Ventricle: Left ventricular ejection fraction, by estimation, is 60 to 65%. The left ventricle has normal function. The left ventricle has no regional wall motion abnormalities. The average left ventricular global longitudinal strain is -9.5 %. Strain was performed and the global longitudinal strain is abnormal. The left ventricular internal cavity size was normal in size. There is mild concentric left ventricular hypertrophy. Left ventricular diastolic parameters are consistent with Grade II diastolic dysfunction (pseudonormalization). Right Ventricle: The right ventricular size is normal. No increase in right ventricular wall thickness. Right ventricular systolic function is normal. Left Atrium: Left atrial size was moderately  dilated. Right Atrium: Right atrial size was normal in size. Pericardium: There is no evidence of pericardial effusion. Mitral Valve: The mitral valve is normal in structure. There is mild thickening of the mitral valve leaflet(s). There is mild calcification of the mitral valve leaflet(s). Mild mitral annular calcification. Mild mitral valve regurgitation. No evidence of  mitral valve stenosis. MV peak gradient, 7.8 mmHg. The mean mitral valve gradient is 2.0 mmHg. Tricuspid Valve: The tricuspid valve is normal in structure. Tricuspid valve regurgitation is trivial. No evidence of tricuspid stenosis. Aortic Valve: The aortic valve is normal in structure. There is moderate calcification of the aortic valve. Aortic valve regurgitation is not visualized. No aortic stenosis is present. Aortic valve mean gradient measures 4.0 mmHg. Aortic valve peak gradient measures 7.5 mmHg. Aortic valve area, by VTI measures 2.13 cm. Pulmonic Valve: The pulmonic valve was normal in structure. Pulmonic valve regurgitation is not visualized. No evidence of pulmonic stenosis. Aorta: The aortic root is normal in size and structure. Venous: The inferior vena cava is normal in size with greater than 50% respiratory variability, suggesting right atrial pressure of 3 mmHg. IAS/Shunts: No atrial level shunt detected by color flow Doppler. Agitated saline contrast was given intravenously to evaluate for intracardiac shunting.  LEFT VENTRICLE PLAX 2D LVIDd:         4.60 cm   Diastology LVIDs:         3.20 cm   LV e' medial:    3.81 cm/s LV PW:         1.10 cm   LV E/e' medial:  33.3 LV IVS:        1.30 cm   LV e' lateral:  4.87 cm/s LVOT diam:     2.00 cm   LV E/e' lateral: 26.1 LV SV:         80 LV SV Index:   42        2D Longitudinal Strain LVOT Area:     3.14 cm  2D Strain GLS Avg:     -9.5 %                           3D Volume EF                          LV 3D EDV:   116.83 ml                          LV 3D ESV:   46.65 ml RIGHT  VENTRICLE             IVC RV Basal diam:  4.10 cm     IVC diam: 1.80 cm RV S prime:     10.10 cm/s TAPSE (M-mode): 1.8 cm LEFT ATRIUM             Index LA diam:        4.90 cm 2.58 cm/m LA Vol (A2C):   82.1 ml 43.28 ml/m LA Vol (A4C):   99.6 ml 52.51 ml/m LA Biplane Vol: 94.6 ml 49.87 ml/m  AORTIC VALVE AV Area (Vmax):    2.34 cm AV Area (Vmean):   2.31 cm AV Area (VTI):     2.13 cm AV Vmax:           137.00 cm/s AV Vmean:          92.700 cm/s AV VTI:            0.374 m AV Peak Grad:      7.5 mmHg AV Mean Grad:      4.0 mmHg LVOT Vmax:         102.00 cm/s LVOT Vmean:        68.300 cm/s LVOT VTI:          0.254 m LVOT/AV VTI ratio: 0.68  AORTA Ao Root diam: 2.90 cm Ao Asc diam:  3.40 cm MITRAL VALVE MV Area (PHT): 1.74 cm     SHUNTS MV Area VTI:   2.00 cm     Systemic VTI:  0.25 m MV Peak grad:  7.8 mmHg     Systemic Diam: 2.00 cm MV Mean grad:  2.0 mmHg MV Vmax:       1.40 m/s MV Vmean:      57.7 cm/s MV Decel Time: 437 msec MV E velocity: 127.00 cm/s MV A velocity: 74.50 cm/s MV E/A ratio:  1.70 Aditya Sabharwal Electronically signed by Ria Commander Signature Date/Time: 01/13/2024/12:58:14 PM    Final    CT Angio Chest Pulmonary Embolism (PE) W or WO Contrast Result Date: 01/12/2024 CLINICAL DATA:  Pulmonary embolism (PE) suspected, high prob EXAM: CT ANGIOGRAPHY CHEST WITH CONTRAST TECHNIQUE: Multidetector CT imaging of the chest was performed using the standard protocol during bolus administration of intravenous contrast. Multiplanar CT image reconstructions and MIPs were obtained to evaluate the vascular anatomy. RADIATION DOSE REDUCTION: This exam was performed according to the departmental dose-optimization program which includes automated exposure control, adjustment of the mA and/or kV according to patient size and/or use of iterative reconstruction technique. CONTRAST:  60mL OMNIPAQUE  IOHEXOL  350 MG/ML SOLN  COMPARISON:  12/31/2023 and previous FINDINGS: Cardiovascular: Heart size normal. No  pericardial effusion. Good contrast opacification of the pulmonary arterial tree. Segmental filling defects in 2 left lower lobe branches. No right-sided pulmonary emboli. Heavy coronary arterial calcifications. Incomplete contrast opacification of the thoracic aorta, with no evidence of aneurysm. Scattered calcified plaque in the arch and descending thoracic aorta. Mediastinum/Nodes: No mediastinal mass or hematoma. Stable borderline enlarged right paratracheal node. Subcentimeter anterior mediastinal nodes. No definite hilar adenopathy. Lungs/Pleura: Persistent dense consolidation in the left lower lobe. Small left pleural effusion, slightly increased. Mild scattered ground-glass opacities in both upper lobes with somewhat geographic distribution, new since previous. Upper Abdomen: Subcentimeter calcified gallstone in the dependent aspect of the nondilated gallbladder. 4 mm calculus in the upper pole left renal collecting system, incompletely visualized. No acute findings. Musculoskeletal:   Right shoulder DJD.  No acute findings. Review of the MIP images confirms the above findings. IMPRESSION: 1. Positive for left lower lobe segmental pulmonary emboli. 2. Persistent dense left lower lobe consolidation with small left pleural effusion. 3. New mild scattered ground-glass opacities in both upper lobes. 4. Cholelithiasis. 5. Left nephrolithiasis. 6.  Aortic Atherosclerosis (ICD10-I70.0). Electronically Signed   By: JONETTA Faes M.D.   On: 01/12/2024 15:10   DG Chest Port 1 View Result Date: 01/12/2024 CLINICAL DATA:  Shortness of breath. EXAM: PORTABLE CHEST 1 VIEW COMPARISON:  01/03/2024 FINDINGS: Low lung volumes. The cardio pericardial silhouette is enlarged. Vascular congestion diffuse interstitial opacities suggesting edema. Retrocardiac left base atelectasis/infiltrate noted. No acute bony abnormality. Telemetry leads overlie the chest. IMPRESSION: 1. Low volume film with vascular congestion and diffuse  interstitial opacities suggesting edema. 2. Retrocardiac left base atelectasis/infiltrate. Electronically Signed   By: Camellia Candle M.D.   On: 01/12/2024 09:32   DG Swallowing Func-Speech Pathology Result Date: 01/04/2024 Table formatting from the original result was not included. Modified Barium Swallow Study Patient Details Name: ALBERTO SCHOCH MRN: [id number] Date of Birth: [birth year] Today's Date: 01/04/2024 HPI/PMH: HPI: 63 yo female with recent admission for NSTEMI, E. coli bacteremia and intermittent heart block, mild to have an obstructing kidney stone.  Troponin is markedly elevated into the 15,000's.  She underwent outpatient nuclear stress testing for cardiac risk assessment and had significant reversible anterior ischemia.  She had outpatient cardiac catheterization but no intervention although bypass grafting is considered.  She now presents back 2 days later with chest pain, nausea and vomiting. Persistent left lower lobe infiltrate first noted May 2025 during hospitalization in [place]  for E. coli bacteremia.  Of note, chest x-ray from October 2024 shows left lower lobe alveolar process. She was a 1 pack/day smoker until she quit in 2017, about 20 pack years .  Concern is for malignancy per MD as well as recurrent aspiration.  She has been on regular diet even though she was advised on a dysphagia diet after her stroke and she clearly provides a history of excessive aspiration and choking on her food. Patient seen by SLP 12/28/2023 with recommendations for a regular diet. Discharged from [facility] in 2019 on a regular diet, thin liquid. Clinical Impression: Clinical Impression: Patient presents with a mild oral dysphagia characterized by left sided facial assymetry and decreased strength resulting in mildly prolonged mastication of regular texture solids and mildly decreased oral containment of thin liquids, at times with swallow delayed to the pyriform sinuses which is suspected to be baseline from  previous CVA. She is able to fully protect her airway however with no penetration  or aspiration observed and with full oral and pharyngeal clearance. Patient may remain on a regular diet, thin liquid. No acute f/u indicated at this time. Factors that may increase risk of adverse event in presence of aspiration Noe & Lianne 2021): No data recorded Recommendations/Plan: Swallowing Evaluation Recommendations Swallowing Evaluation Recommendations Recommendations: PO diet PO Diet Recommendation: Regular; Thin liquids (Level 0) Liquid Administration via: Cup; Straw Medication Administration: Whole meds with liquid Supervision: Patient able to self-feed Swallowing strategies  : Slow rate; Small bites/sips Postural changes: Position pt fully upright for meals Oral care recommendations: Oral care BID (2x/day) Treatment Plan Treatment Plan Treatment recommendations: No treatment recommended at this time Follow-up recommendations: No SLP follow up Functional status assessment: Patient has not had a recent decline in their functional status. Recommendations Recommendations for follow up therapy are one component of a multi-disciplinary discharge planning process, led by the attending physician.  Recommendations may be updated based on patient status, additional functional criteria and insurance authorization. Assessment: Orofacial Exam: Orofacial Exam Oral Cavity: Oral Hygiene: WFL Oral Cavity - Dentition: Adequate natural dentition Orofacial Anatomy: WFL Oral Motor/Sensory Function: Suspected cranial nerve impairment CN VII - Facial: Left motor impairment (from previous CVA) Anatomy: Anatomy: WFL Boluses Administered: Boluses Administered Boluses Administered: Thin liquids (Level 0); Mildly thick liquids (Level 2, nectar thick); Puree; Solid  Oral Impairment Domain: Oral Impairment Domain Lip Closure: No labial escape Tongue control during bolus hold: Posterior escape of less than half of bolus Bolus  preparation/mastication: Slow prolonged chewing/mashing with complete recollection Bolus transport/lingual motion: Brisk tongue motion Oral residue: Complete oral clearance Initiation of pharyngeal swallow : Pyriform sinuses  Pharyngeal Impairment Domain: Pharyngeal Impairment Domain Soft palate elevation: No bolus between soft palate (SP)/pharyngeal wall (PW) Laryngeal elevation: Complete superior movement of thyroid cartilage with complete approximation of arytenoids to epiglottic petiole Anterior hyoid excursion: Complete anterior movement Epiglottic movement: Complete inversion Laryngeal vestibule closure: Complete, no air/contrast in laryngeal vestibule Pharyngeal stripping wave : Present - complete Pharyngeal contraction (A/P view only): N/A Pharyngoesophageal segment opening: Complete distension and complete duration, no obstruction of flow Tongue base retraction: No contrast between tongue base and posterior pharyngeal wall (PPW) Pharyngeal residue: Complete pharyngeal clearance  Esophageal Impairment Domain: Esophageal Impairment Domain Esophageal clearance upright position: Complete clearance, esophageal coating Pill: Pill Consistency administered: Thin liquids (Level 0) Thin liquids (Level 0): [facility] Penetration/Aspiration Scale Score: Penetration/Aspiration Scale Score 1.  Material does not enter airway: Thin liquids (Level 0); Mildly thick liquids (Level 2, nectar thick); Puree; Solid; Pill Compensatory Strategies: No data recorded  General Information: Caregiver present: Yes  Diet Prior to this Study: Regular; Thin liquids (Level 0)   Temperature : Normal   Respiratory Status: WFL   Supplemental O2: Nasal cannula   History of Recent Intubation: No  Behavior/Cognition: Alert; Cooperative Self-Feeding Abilities: Able to self-feed Baseline vocal quality/speech: Normal Volitional Cough: Able to elicit Volitional Swallow: Able to elicit Exam Limitations: No limitations Goal Planning: No data recorded No data  recorded No data recorded No data recorded No data recorded Pain: Pain Assessment Pain Assessment: No/denies pain End of Session: Start Time:SLP Start Time (ACUTE ONLY): 0930 Stop Time: SLP Stop Time (ACUTE ONLY): 0945 Time Calculation:SLP Time Calculation (min) (ACUTE ONLY): 15 min Charges: SLP Evaluations $ SLP Speech Visit: 1 Visit SLP Evaluations $MBS Swallow: 1 Procedure SLP visit diagnosis: SLP Visit Diagnosis: Dysphagia, unspecified (R13.10) Past Medical History: Past Medical History: Diagnosis Date  Bradycardia   SR/SB with up to 3.5 second pause (asymptomatic) on ~  10/2017 event monitor; saw EP Dr. Kelsie: avoid AV nodal blocking agents  Chronic kidney disease   COPD (chronic obstructive pulmonary disease) (HCC)   Depression   Depression with anxiety 08/29/2021  Diabetes mellitus   Dysarthria, post-stroke   Dysrhythmia   Ganglion cyst 03/09/2012  History of kidney stones   Hyperlipidemia   Hypertension   Hypokalemia   Myocardial infarction [facility])   NSTEMI  Obesity, unspecified 12/17/2011  Pneumonia   Pneumonia of left lower lobe due to infectious organism 12/12/2023  PONV (postoperative nausea and vomiting)   Skin lesion of face 11/17/2022  Stroke [facility])   2019   right side hemiparesis Past Surgical History: Past Surgical History: Procedure Laterality Date  CESAREAN SECTION    x 3  LEFT HEART CATH AND CORONARY ANGIOGRAPHY N/A 12/28/2023  Procedure: LEFT HEART CATH AND CORONARY ANGIOGRAPHY;  Surgeon: Elmira Newman PARAS, MD;  Location: [facility] INVASIVE CV LAB;  Service: Cardiovascular;  Laterality: N/A;  ORIF WRIST FRACTURE Right 05/04/2018  Procedure: OPEN REDUCTION INTERNAL FIXATION (ORIF) RIGHT  WRIST FRACTURE;  Surgeon: Cristy Bonner DASEN, MD;  Location: [facility] OR;  Service: Orthopedics;  Laterality: Right;  TUMOR REMOVAL    left shoulder Rea Pass MA, [name] McCoy Leah [name] 01/04/2024, 10:21 AM  DG Chest Port 1 View Result Date: 01/03/2024 CLINICAL DATA:  [id number] S/P bronchoscopy with biopsy [id number]. EXAM: PORTABLE  CHEST 1 VIEW COMPARISON:  12/30/2023. FINDINGS: Low lung volume. Mild diffuse pulmonary vascular congestion,, improved since the prior study. Redemonstration of heterogeneous left retrocardiac opacity, grossly similar to the prior study. Please refer to CT scan chest from 12/31/2023 for details. Bilateral lung fields are otherwise clear. Bilateral costophrenic angles are clear. No pneumothorax. Stable cardio-mediastinal silhouette. No acute osseous abnormalities. The soft tissues are within normal limits. IMPRESSION: 1. No pneumothorax. 2. Mild diffuse pulmonary vascular congestion, improved since the prior study. 3. Redemonstration of heterogeneous left retrocardiac opacity, grossly similar to the prior study. Electronically Signed   By: Ree Molt M.D.   On: 01/03/2024 15:44   DG C-ARM BRONCHOSCOPY Result Date: 01/03/2024 C-ARM BRONCHOSCOPY: Fluoroscopy was utilized by the requesting physician.  No radiographic interpretation.   US  RENAL Result Date: 12/31/2023 CLINICAL DATA:  LEFT renal calculus EXAM: RENAL / URINARY TRACT ULTRASOUND COMPLETE COMPARISON:  Renal stone protocol CT 12/12/2023 FINDINGS: Right Kidney: Renal measurements: 6.8 x 3.6 x 3.1 cm = volume: 36 mL. Echogenicity within normal limits. No mass or hydronephrosis visualized. Left Kidney: Renal measurements: 9.9 x 6.1 x 3.8 cm = volume: 119 mL. Echogenicity within normal limits. No hydronephrosis. Nonobstructing calculi again seen within the renal pelvis measuring 11 mm and within the lower pole calyx measuring 9 mm. Bladder: Appears normal for degree of bladder distention. Other: None. IMPRESSION: 1. No hydronephrosis. 2. Nonobstructing calculi seen within the LEFT renal pelvis and lower pole calyx. Electronically Signed   By: Aliene Lloyd M.D.   On: 12/31/2023 16:31   CT CHEST WO CONTRAST Result Date: 12/31/2023 CLINICAL DATA:  Pulmonary hypertension, respiratory illness, shortness of breath, left lower lobe consolidation * Tracking  Code: BO * EXAM: CT CHEST WITHOUT CONTRAST TECHNIQUE: Multidetector CT imaging of the chest was performed following the standard protocol without IV contrast. RADIATION DOSE REDUCTION: This exam was performed according to the departmental dose-optimization program which includes automated exposure control, adjustment of the mA and/or kV according to patient size and/or use of iterative reconstruction technique. COMPARISON:  Chest radiograph, 12/30/2023 FINDINGS: Cardiovascular: Aortic atherosclerosis. Normal heart size. Three-vessel coronary artery calcifications.  No pericardial effusion. Mediastinum/Nodes: Prominent mediastinal lymph nodes, pretracheal nodes measuring up to 1.4 x 1.4 cm. Thyroid gland, trachea, and esophagus demonstrate no significant findings. Lungs/Pleura: Dense, masslike consolidation of the dependent left lower lobe measuring up to 6.1 x 3.9 cm with internal air bronchograms (series 4, image 93). Mild interlobular septal thickening in the lung bases. Small left pleural effusion. Scattered areas of bandlike scarring in both lung bases. Upper Abdomen: No acute abnormality. Musculoskeletal: No chest wall abnormality. No acute osseous findings. IMPRESSION: 1. Dense, masslike consolidation of the dependent left lower lobe measuring up to 6.1 x 3.9 cm with internal air bronchograms. This may reflect infection or aspiration but is generally somewhat worrisome for malignancy. Recommend short interval follow-up to ensure resolution. 2. Mild interlobular septal thickening in the lung bases, consistent with mild pulmonary edema. 3. Small left pleural effusion. 4. Prominent mediastinal lymph nodes, likely reactive. Attention on follow-up. 5. Coronary artery disease. Aortic Atherosclerosis (ICD10-I70.0). Electronically Signed   By: Marolyn JONETTA Jaksch M.D.   On: 12/31/2023 11:57   DG Chest 2 View Result Date: 12/30/2023 CLINICAL DATA:  cp EXAM: CHEST - 2 VIEW COMPARISON:  Dec 12, 2023 FINDINGS: Diffuse  interstitial opacities throughout the lungs. Patchy airspace opacities in the right suprahilar upper lobe and the left lung, mostly in the left lower lobe. No pleural effusion or pneumothorax. Mild cardiomegaly. Aortic atherosclerosis. No acute fracture or destructive lesion. Multilevel thoracic osteophytosis. IMPRESSION: Cardiomegaly with findings of either asymmetric pulmonary edema or multifocal pneumonia. Given the persistence of the masslike consolidation in the left lower lobe on the prior CT of the abdomen and pelvis, a follow-up chest CT with IV contrast is recommended. Electronically Signed   By: Rogelia Myers M.D.   On: 12/30/2023 11:26    ASSESSMENT AND PLAN: Assessment & Plan Non-small cell lung cancer, adenocarcinoma, likely Stage IIIC (T3, N3, M0) pending further staging workup. Recently diagnosed adenocarcinoma subtype located in the left lower lobe, measuring 6.1 x 3.9 cm. A pre-tracheal lymph node on the right side measures 1.4 x 1.4 cm, potentially indicating stage 3C if cancerous. Further staging is required to determine lymph node involvement. Surgery is not an option due to lymph node involvement and her overall condition. The goal of treatment is either cure or control, depending on tumor spread. If the lymph node is not cancerous, the stage may be reduced to stage 2. - Order PET scan to assess lymph node involvement and overall staging - Order MRI of the brain to assess for metastasis - Send biopsy for molecular marker testing - Schedule follow-up appointment in two weeks to discuss results and treatment plan  Pulmonary embolism Blood clot in the left lower lobe arteries, managed with anticoagulation therapy. She is on Eliquis  and reports minor hematuria, possibly related to kidney stones.  Chronic obstructive pulmonary disease (COPD) COPD with current labored breathing. No acute exacerbation noted.  Chronic kidney disease Chronic kidney disease with a history of kidney  stones. Hematuria may be related to kidney stones.  Kidney stones Currently causing hematuria. No intervention planned due to concurrent cancer treatment.  Hypertension Hypertension, no specific discussion during the visit.  Hyperlipidemia Hyperlipidemia, no specific discussion during the visit.  Type 2 diabetes mellitus Type 2 diabetes mellitus, no specific discussion during the visit.  Depression Depression, no specific discussion during the visit.  History of stroke History of stroke, no specific discussion during the visit.  History of myocardial infarction Recent myocardial infarction during a trip to [place], currently  well-managed. The patient was advised to call immediately if she has any concerning symptoms in the interval.  The patient voices understanding of current disease status and treatment options and is in agreement with the current care plan.  All questions were answered. The patient knows to call the clinic with any problems, questions or concerns. We can certainly see the patient much sooner if necessary.  Thank you so much for allowing me to participate in the care of Kelly Gibson. I will continue to follow up the patient with you and assist in her care. The total time spent in the appointment was 60 minutes including review of chart and various tests results, discussions about plan of care and coordination of care plan .   Disclaimer: This note was dictated with voice recognition software. Similar sounding words can inadvertently be transcribed and may not be corrected upon review.   Sherrod MARLA Sherrod January 27, 2024, 2:47 PM

## 2024-01-27 NOTE — Progress Notes (Signed)
 01/27/2024 Name: Kelly Gibson MRN: [id number] DOB: 1961/04/13  Chief Complaint  Patient presents with   Medication Management   Diabetes   Medication Assistance    Kelly Gibson is a 64 y.o. year old female who presented for a telephone visit.   They were referred to the pharmacist by their PCP for assistance in managing diabetes, medication access, and complex medication management.    Subjective:  Care Team: Primary Care Provider: Oris [name], NP ; Next Scheduled Visit: 05/15/24  Medication Access/Adherence  Current Pharmacy:  [facility] [id number] - [place], [place] - [address] [place] [address] Phone: [phone] Fax: [phone]  [name] [address] [place] [address] Phone: [phone] Fax: [phone]   Patient reports affordability concerns with their medications: Yes  - tradjenta  in past but was discontinued in the last 1-2 weeks at most recent hospitalization Patient reports access/transportation concerns to their pharmacy: No  Patient reports adherence concerns with their medications:  No     Diabetes:  Current medications: Tresiba  22 units in morning, 10 units at night; Humalog  SS  Medications tried in the past: Tradjenta , Glyburide , Metformin   Current glucose readings:   Observed patterns:  Patient denies hypoglycemic s/sx including dizziness, shakiness, sweating. Patient denies hyperglycemic symptoms including polyuria, polydipsia, polyphagia, nocturia, neuropathy, blurred vision.   Current medication access support: None  Medication Assistance: -Patient recently started on Eliquis  and Breztri , husband reports no cost concerns at this time but that may change -Reports no issues obtaining current medications due to cost. If endo decides to restart tradjenta , that may change as that has been a cost issue in the past (sees endo  02/10/24)   Objective:  Lab Results  Component Value Date   HGBA1C 7.3 (H) 01/10/2024    Lab Results  Component Value Date   CREATININE 1.25 (H) 01/14/2024   BUN 32 (H) 01/14/2024   NA 141 01/14/2024   K 3.8 01/14/2024   CL 105 01/14/2024   CO2 24 01/14/2024    Lab Results  Component Value Date   CHOL 159 01/10/2024   HDL 62 01/10/2024   LDLCALC 71 01/10/2024   LDLDIRECT 70 01/10/2024   TRIG 157 (H) 01/10/2024   CHOLHDL 2.9 01/30/2022    Medications Reviewed Today     Reviewed by Lionell Jon DEL, RPH (Pharmacist) on 01/27/24 at 1025  Med List Status: <None>   Medication Order Taking? Sig Documenting Provider Last Dose Status Informant  albuterol  (PROVENTIL ) (2.5 MG/3ML) 0.083% nebulizer solution [id number] Yes Inhale 3 mLs (2.5 mg total) into the lungs every 6 (six) hours as needed for wheezing or shortness of breath. Darci Pore, MD  Active Spouse/Significant Other, Pharmacy Records  albuterol  (VENTOLIN  HFA) 108 [id number] Base) MCG/ACT inhaler [id number] Yes Inhale 2 puffs into the lungs every 6 (six) hours as needed for wheezing or shortness of breath. Perri DELENA Meliton Mickey., MD  Active Spouse/Significant Other, Pharmacy Records  APIXABAN  (ELIQUIS ) VTE STARTER PACK (10MG  AND 5MG ) [id number] Yes Take as directed on package: start with two-5mg  tablets twice daily for 7 days. On day 8, switch to one-5mg  tablet twice daily. Laurence Locus, DO  Active   aspirin  EC 81 MG tablet [id number] Yes Take 1 tablet (81 mg total) by mouth daily. Swallow whole. Perri DELENA Meliton Mickey., MD  Active Spouse/Significant Other, Pharmacy Records  budesonide -glycopyrrolate -formoterol  (BREZTRI  AEROSPHERE) 160-9-4.8 MCG/ACT AERO inhaler [id number] Yes Inhale 2 puffs into the lungs  2 (two) times daily. Perri DELENA Meliton Mickey., MD  Active Spouse/Significant Other, Pharmacy Records  citalopram  (CELEXA ) 10 MG tablet [id number] Yes TAKE 1 TABLET BY MOUTH DAILY Early, Sara E, NP  Active Spouse/Significant Other,  Pharmacy Records  Continuous Glucose Sensor (DEXCOM G7 SENSOR) MISC [id number] Yes Take 1 Device by mouth See admin instructions. Change sensor every 10 days [provider]  Active Spouse/Significant Other, Pharmacy Records  Doxylamine  Succinate, Sleep, (UNISOM  PO) [id number] Yes Take 2 each by mouth at bedtime. Gel capsules [provider]  Active Spouse/Significant Other, Pharmacy Records  furosemide  (LASIX ) 20 MG tablet [id number] Yes Take 1 tablet (20 mg total) by mouth daily as needed for fluid or edema (take if daily weight is greater than 2 lbs from the previous day's weight.). Laurence Locus, DO  Active   HUMALOG  KWIKPEN 100 UNIT/ML KwikPen [id number] Yes INJECT 3 TO 5 UNITS INTO THE SKIN DAILY BEFORE LUNCH Early, Sara E, NP  Active Spouse/Significant Other, Pharmacy Records  hydrALAZINE  (APRESOLINE ) 100 MG tablet [id number]  1 tablet up to every 12 hours only as needed for blood pressure >150/90 Early, Sara E, NP  Active Spouse/Significant Other, Pharmacy Records  insulin  degludec (TRESIBA  FLEXTOUCH) 100 UNIT/ML FlexTouch Pen [id number] Yes Inject 10 Units into the skin 2 (two) times daily. Adjust by 2 units every 3 days as needed for blood sugars higher than 150 in the morning fasting. Early, Sara E, NP  Active Spouse/Significant Other, Pharmacy Records           Med Note STEFFI NIAN   Thu Dec 23, 2023 11:16 AM)    Melatonin 5 MG CHEW [id number] Yes Chew 10 mg by mouth at bedtime. [provider]  Active Spouse/Significant Other, Pharmacy Records  Multiple Vitamins-Minerals (MULTIVITAMIN GUMMIES WOMENS) CICERO [id number] Yes Chew 2 each by mouth in the morning. [provider]  Active Spouse/Significant Other, Pharmacy Records  olmesartan (BENICAR) 40 MG tablet [id number] Yes Take 40 mg by mouth in the morning. [provider]  Active Spouse/Significant Other, Pharmacy Records  rosuvastatin  (CRESTOR ) 40 MG tablet [id number] Yes TAKE 1 TABLET BY MOUTH DAILY  Early, Sara E, NP  Active Spouse/Significant Other, Pharmacy Records  UNABLE TO FIND [id number] Yes Take 1 tablet by mouth with breakfast, with lunch, and with evening meal. GlucoGold -Berberine, Concentrated Cinnamon, Chromium, Banaba Leaf Extract [provider]  Active Spouse/Significant Other, Pharmacy Records  Med List Note STEFFI NIAN, CPhT 12/23/23 1134): Husband manages medications               Assessment/Plan:   Diabetes: - Currently uncontrolled but improving - Reviewed long term cardiovascular and renal outcomes of uncontrolled blood sugar - Reviewed goal A1c, goal fasting, and goal 2 hour post prandial glucose - Recommend to continue current medication therapy. Discuss with endo on 7/17 appt if tradjenta  needs to be resumed or other therapy - Recommend to check glucose monitoring with Dexcom G7. Connected to our portal for remote access to sugar readings - Meets financial criteria for Tresiba /Novolog /Tradjenta /Breztri  assistance if needed. Patient does not need to pursue at this moment, will discuss again at follow up.     Medication Management: - May pursue PAP for tresiba /novolog /breztri  in future. Tradjenta  as well if restarted -Patient would also qualify for jardiance or farxiga if SGLT2 is initiated this year for dm control    Follow Up Plan: 02/17/24  Jon VEAR Lindau, PharmD Clinical Pharmacist [phone]

## 2024-01-30 ENCOUNTER — Encounter: Payer: Self-pay | Admitting: Nurse Practitioner

## 2024-01-31 ENCOUNTER — Other Ambulatory Visit: Payer: Self-pay

## 2024-02-01 ENCOUNTER — Other Ambulatory Visit: Payer: Self-pay | Admitting: Nurse Practitioner

## 2024-02-01 DIAGNOSIS — N3090 Cystitis, unspecified without hematuria: Secondary | ICD-10-CM

## 2024-02-01 MED ORDER — CEFADROXIL 500 MG PO CAPS: 500.0000 mg | ORAL_CAPSULE | Freq: Every day | ORAL | 0 refills | Status: DC

## 2024-02-02 ENCOUNTER — Telehealth: Payer: Self-pay | Admitting: Physician Assistant

## 2024-02-02 NOTE — Telephone Encounter (Signed)
 Left the patient a voicemail with the scheduled appointment details per secure chat.

## 2024-02-02 NOTE — Progress Notes (Signed)
 7/8: I reached out to pt to make self introduction and offer assistance with scheduling her PET scan and MRI. Per Dr.Mohameds consult note, the pt is to have molecular studies done on her tissue and a PET and MRI prior to seeing him in 2 weeks.  I confirmed with the pt that she was diabetic and explained she would have to hold any medication for her diabetes the morning of and can only have plain water 6hrs prior to the appt.  After speaking to central scheduling, I called the pt back to let her know there is a 430pm appt available at [facility] on 7/17. I told the pt I would also try to get her an MRI the same day as her PET scan.  After speaking to central scheduling a second time, I was able to arrange pts brain MRI for 3:30pm on 7/17.  I called the pt back to let her know of her MRI appt. No answer, left VM with details of pt's imaging appts.

## 2024-02-03 LAB — FUNGUS CULTURE WITH STAIN

## 2024-02-03 LAB — FUNGUS CULTURE RESULT

## 2024-02-03 LAB — FUNGAL ORGANISM REFLEX

## 2024-02-07 ENCOUNTER — Other Ambulatory Visit (HOSPITAL_COMMUNITY)

## 2024-02-10 ENCOUNTER — Ambulatory Visit (HOSPITAL_COMMUNITY)
Admission: RE | Admit: 2024-02-10 | Discharge: 2024-02-10 | Disposition: A | Discharge status: Home / Self Care | Source: Ambulatory Visit | Attending: Internal Medicine | Admitting: Internal Medicine

## 2024-02-10 DIAGNOSIS — C3490 Malignant neoplasm of unspecified part of unspecified bronchus or lung: Secondary | ICD-10-CM

## 2024-02-10 DIAGNOSIS — E1165 Type 2 diabetes mellitus with hyperglycemia: Secondary | ICD-10-CM | POA: Diagnosis not present

## 2024-02-10 LAB — GLUCOSE, CAPILLARY: Glucose-Capillary: 80 mg/dL (ref 70–99)

## 2024-02-10 MED ORDER — GADOBUTROL 1 MMOL/ML IV SOLN
9.0000 mL | Freq: Once | INTRAVENOUS | Status: AC | PRN
  Administered 2024-02-10: 9 mL via INTRAVENOUS

## 2024-02-10 MED ORDER — FLUDEOXYGLUCOSE F - 18 (FDG) INJECTION
9.4800 | Freq: Once | INTRAVENOUS | Status: AC
  Administered 2024-02-10: 9.48 via INTRAVENOUS

## 2024-02-10 NOTE — Progress Notes (Signed)
 [facility] OFFICE PROGRESS NOTE  Early, Kelly BRAVO, NP [address] [place] [address]  DIAGNOSIS: Stage III (T3, N3, M0) NSCLC, adenocarcinoma. She presented with left lower lobe mass and intermittent activity with nonenlarged mediastinal lymph nodes.  She was diagnosed in June 2025.  Molecular Studies: Will be sent off today 02/15/24  PRIOR THERAPY: None  CURRENT THERAPY: Concurrent chemoradiation with weekly carboplatin for an AUC of 2 and paclitaxel 45 mg/m.  First dose on 02/28/2024.  INTERVAL HISTORY: NOTNAMED Kelly Gibson 63 y.o. female returns to the clinic today for a follow up visit accompanied by her husband. The patient established care in the clinic on 01/27/24. She was recently diagnosed with lung cancer and needs to complete the staging workup with MRI and PET.   She denies any major changes in her health since she was last seen. She denies Denies any fever, chills, night sweats, or weight loss.  Reports that she sometimes has shortness of breath which is unchanged compared to when she was last here.  She denies any cough, chest pain, or hemoptysis. Denies any nausea, vomiting, or diarrhea. She takes Doculax. She also will eat prunes. Denies any headache or visual changes. Denies any rashes or skin changes. She is on a blood thinner for PE. She is here for a more detailed discussion about her current condition and treatment options.    MEDICAL HISTORY: Past Medical History:  Diagnosis Date   Bradycardia    SR/SB with up to 3.5 second pause (asymptomatic) on ~ 10/2017 event monitor; saw EP Dr. Kelsie: avoid AV nodal blocking agents   Chronic kidney disease    COPD (chronic obstructive pulmonary disease) (HCC)    Depression    Depression with anxiety 08/29/2021   Diabetes mellitus    Dysarthria, post-stroke    Dysrhythmia    Ganglion cyst 03/09/2012   Heart block AV complete (HCC) 12/11/2023   History of kidney stones    Hyperlipidemia    Hypertension     Hypokalemia    Myocardial infarction [facility])    NSTEMI   Non-ST elevation (NSTEMI) myocardial infarction (HCC) 12/11/2023   Obesity, unspecified 12/17/2011   Pneumonia    Pneumonia of left lower lobe due to infectious organism 12/12/2023   PONV (postoperative nausea and vomiting)    Skin lesion of face 11/17/2022   Stroke (HCC)    2019   right side hemiparesis    ALLERGIES:  is allergic to latex and niacin.  MEDICATIONS:  Current Outpatient Medications  Medication Sig Dispense Refill   cefadroxil  (DURICEF) 500 MG capsule Take 1 capsule (500 mg total) by mouth daily. 5 capsule 0   albuterol  (PROVENTIL ) (2.5 MG/3ML) 0.083% nebulizer solution Inhale 3 mLs (2.5 mg total) into the lungs every 6 (six) hours as needed for wheezing or shortness of breath. 75 mL 12   albuterol  (VENTOLIN  HFA) 108 (90 Base) MCG/ACT inhaler Inhale 2 puffs into the lungs every 6 (six) hours as needed for wheezing or shortness of breath. 18 g 2   APIXABAN  (ELIQUIS ) VTE STARTER PACK (10MG  AND 5MG ) Take as directed on package: start with two-5mg  tablets twice daily for 7 days. On day 8, switch to one-5mg  tablet twice daily. 74 each 0   aspirin  EC 81 MG tablet Take 1 tablet (81 mg total) by mouth daily. Swallow whole. 30 tablet 12   budesonide -glycopyrrolate -formoterol  (BREZTRI  AEROSPHERE) 160-9-4.8 MCG/ACT AERO inhaler Inhale 2 puffs into the lungs 2 (two) times daily. 10.7 g 1  citalopram  (CELEXA ) 10 MG tablet TAKE 1 TABLET BY MOUTH DAILY 90 tablet 1   Continuous Glucose Sensor (DEXCOM G7 SENSOR) MISC Take 1 Device by mouth See admin instructions. Change sensor every 10 days     Doxylamine  Succinate, Sleep, (UNISOM  PO) Take 2 each by mouth at bedtime. Gel capsules     furosemide  (LASIX ) 20 MG tablet Take 1 tablet (20 mg total) by mouth daily as needed for fluid or edema (take if daily weight is greater than 2 lbs from the previous day's weight.). 90 tablet 0   HUMALOG  KWIKPEN 100 UNIT/ML KwikPen INJECT 3 TO 5 UNITS  INTO THE SKIN DAILY BEFORE LUNCH 6 mL 3   hydrALAZINE  (APRESOLINE ) 100 MG tablet 1 tablet up to every 12 hours only as needed for blood pressure >150/90     insulin  degludec (TRESIBA  FLEXTOUCH) 100 UNIT/ML FlexTouch Pen Inject 10 Units into the skin 2 (two) times daily. Adjust by 2 units every 3 days as needed for blood sugars higher than 150 in the morning fasting. 9 mL 11   Melatonin 5 MG CHEW Chew 10 mg by mouth at bedtime.     Multiple Vitamins-Minerals (MULTIVITAMIN GUMMIES WOMENS) CHEW Chew 2 each by mouth in the morning.     olmesartan (BENICAR) 40 MG tablet Take 40 mg by mouth in the morning.     rosuvastatin  (CRESTOR ) 40 MG tablet TAKE 1 TABLET BY MOUTH DAILY 90 tablet 3   UNABLE TO FIND Take 1 tablet by mouth with breakfast, with lunch, and with evening meal. GlucoGold -Berberine, Concentrated Cinnamon, Chromium, Banaba Leaf Extract     No current facility-administered medications for this visit.    SURGICAL HISTORY:  Past Surgical History:  Procedure Laterality Date   CESAREAN SECTION     x 3   LEFT HEART CATH AND CORONARY ANGIOGRAPHY N/A 12/28/2023   Procedure: LEFT HEART CATH AND CORONARY ANGIOGRAPHY;  Surgeon: Elmira Newman PARAS, MD;  Location: [facility] INVASIVE CV LAB;  Service: Cardiovascular;  Laterality: N/A;   ORIF WRIST FRACTURE Right 05/04/2018   Procedure: OPEN REDUCTION INTERNAL FIXATION (ORIF) RIGHT  WRIST FRACTURE;  Surgeon: Cristy Bonner DASEN, MD;  Location: [facility] OR;  Service: Orthopedics;  Laterality: Right;   TUMOR REMOVAL     left shoulder   VIDEO BRONCHOSCOPY WITH ENDOBRONCHIAL NAVIGATION  01/03/2024   Procedure: VIDEO BRONCHOSCOPY WITH ENDOBRONCHIAL NAVIGATION;  Surgeon: Shelah Lamar RAMAN, MD;  Location: [facility] ENDOSCOPY;  Service: Pulmonary;;   VIDEO BRONCHOSCOPY WITH ENDOBRONCHIAL ULTRASOUND Bilateral 01/03/2024   Procedure: BRONCHOSCOPY, WITH EBUS;  Surgeon: Shelah Lamar RAMAN, MD;  Location: [facility] ENDOSCOPY;  Service: Pulmonary;  Laterality: Bilateral;    REVIEW OF SYSTEMS:    Review of Systems  Constitutional: Negative for appetite change, chills, fatigue, fever and unexpected weight change.  HENT: Negative for mouth sores, nosebleeds, sore throat and trouble swallowing.   Eyes: Negative for eye problems and icterus.  Respiratory: Positive for shortness of breath intermittently. Negative for cough, hemoptysis, and wheezing.   Cardiovascular: Negative for chest pain and leg swelling.  Gastrointestinal: Positive for mild constipation. Negative for abdominal pain, diarrhea, nausea and vomiting.  Genitourinary: Negative for bladder incontinence, difficulty urinating, dysuria, frequency and hematuria.   Musculoskeletal: Negative for back pain, gait problem, neck pain and neck stiffness.  Skin: Negative for itching and rash.  Neurological: Negative for dizziness, extremity weakness, gait problem, headaches, light-headedness and seizures.  Hematological: Negative for adenopathy. Does not bruise/bleed easily.  Psychiatric/Behavioral: Negative for confusion, depression and sleep disturbance. The  patient is not nervous/anxious.     PHYSICAL EXAMINATION:  Last menstrual period 12/10/2012.  ECOG PERFORMANCE STATUS: 1  Physical Exam  Constitutional: Oriented to person, place, and time and well-developed, well-nourished, and in no distress.  HENT:  Head: Normocephalic and atraumatic.  Mouth/Throat: Oropharynx is clear and moist. No oropharyngeal exudate.  Eyes: Conjunctivae are normal. Right eye exhibits no discharge. Left eye exhibits no discharge. No scleral icterus.  Neck: Normal range of motion. Neck supple.  Cardiovascular: Normal rate, regular rhythm, normal heart sounds and intact distal pulses.   Pulmonary/Chest: Effort normal and breath sounds normal. No respiratory distress. No wheezes. No rales.  Abdominal: Soft. Bowel sounds are normal. Exhibits no distension and no mass. There is no tenderness.  Musculoskeletal: Normal range of motion. Exhibits no edema.   Lymphadenopathy:    No cervical adenopathy.  Neurological: Alert and oriented to person, place, and time. Exhibits muscle wasting. She is examined in a motorized wheelchair.  Skin: Skin is warm and dry. No rash noted. Not diaphoretic. No erythema. No pallor.  Psychiatric: Mood, memory and judgment normal.  Vitals reviewed.  LABORATORY DATA: Lab Results  Component Value Date   WBC 5.8 01/27/2024   HGB 12.2 01/27/2024   HCT 38.1 01/27/2024   MCV 84.3 01/27/2024   PLT 322 01/27/2024      Chemistry      Component Value Date/Time   NA 141 01/27/2024 1401   NA 144 01/10/2024 1508   K 4.0 01/27/2024 1401   CL 105 01/27/2024 1401   CO2 27 01/27/2024 1401   BUN 20 01/27/2024 1401   BUN 27 01/10/2024 1508   CREATININE 1.56 (H) 01/27/2024 1401   CREATININE 1.12 (H) 05/22/2020 1211      Component Value Date/Time   CALCIUM  9.3 01/27/2024 1401   CALCIUM  10.0 08/18/2023 0000   ALKPHOS 75 01/27/2024 1401   AST 15 01/27/2024 1401   ALT 11 01/27/2024 1401   BILITOT 0.4 01/27/2024 1401       RADIOGRAPHIC STUDIES:  ECHOCARDIOGRAM COMPLETE BUBBLE STUDY Result Date: 01/13/2024    ECHOCARDIOGRAM REPORT   Patient Name:   FEDORA KNISELY Date of Exam: 01/13/2024 Medical Rec #:  [id number]         Height:       63.0 in Accession #:    [id number]        Weight:       191.1 lb Date of Birth:  [birth year]        BSA:          1.897 m Patient Age:    62 years          BP:           151/56 mmHg Patient Gender: F                 HR:           54 bpm. Exam Location:  Inpatient Procedure: 2D Echo, Saline Contrast Bubble Study, Cardiac Doppler, Color Doppler            and Strain Analysis (Both Spectral and Color Flow Doppler were            utilized during procedure). Indications:    CVA  History:        Patient has prior history of Echocardiogram examinations, most                 recent 10/27/2017.  Sonographer:    Therisa Crouch Referring  Phys: [id number] MARSHA ADA IMPRESSIONS  1. Left ventricular  ejection fraction, by estimation, is 60 to 65%. The left ventricle has normal function. The left ventricle has no regional wall motion abnormalities. There is mild concentric left ventricular hypertrophy. Left ventricular diastolic parameters are consistent with Grade II diastolic dysfunction (pseudonormalization). The average left ventricular global longitudinal strain is -9.5 %. The global longitudinal strain is abnormal.  2. Right ventricular systolic function is normal. The right ventricular size is normal.  3. Left atrial size was moderately dilated.  4. The mitral valve is normal in structure. Mild mitral valve regurgitation. No evidence of mitral stenosis.  5. The aortic valve is normal in structure. There is moderate calcification of the aortic valve. Aortic valve regurgitation is not visualized. No aortic stenosis is present.  6. The inferior vena cava is normal in size with greater than 50% respiratory variability, suggesting right atrial pressure of 3 mmHg. FINDINGS  Left Ventricle: Left ventricular ejection fraction, by estimation, is 60 to 65%. The left ventricle has normal function. The left ventricle has no regional wall motion abnormalities. The average left ventricular global longitudinal strain is -9.5 %. Strain was performed and the global longitudinal strain is abnormal. The left ventricular internal cavity size was normal in size. There is mild concentric left ventricular hypertrophy. Left ventricular diastolic parameters are consistent with Grade II diastolic dysfunction (pseudonormalization). Right Ventricle: The right ventricular size is normal. No increase in right ventricular wall thickness. Right ventricular systolic function is normal. Left Atrium: Left atrial size was moderately dilated. Right Atrium: Right atrial size was normal in size. Pericardium: There is no evidence of pericardial effusion. Mitral Valve: The mitral valve is normal in structure. There is mild thickening of the  mitral valve leaflet(s). There is mild calcification of the mitral valve leaflet(s). Mild mitral annular calcification. Mild mitral valve regurgitation. No evidence of  mitral valve stenosis. MV peak gradient, 7.8 mmHg. The mean mitral valve gradient is 2.0 mmHg. Tricuspid Valve: The tricuspid valve is normal in structure. Tricuspid valve regurgitation is trivial. No evidence of tricuspid stenosis. Aortic Valve: The aortic valve is normal in structure. There is moderate calcification of the aortic valve. Aortic valve regurgitation is not visualized. No aortic stenosis is present. Aortic valve mean gradient measures 4.0 mmHg. Aortic valve peak gradient measures 7.5 mmHg. Aortic valve area, by VTI measures 2.13 cm. Pulmonic Valve: The pulmonic valve was normal in structure. Pulmonic valve regurgitation is not visualized. No evidence of pulmonic stenosis. Aorta: The aortic root is normal in size and structure. Venous: The inferior vena cava is normal in size with greater than 50% respiratory variability, suggesting right atrial pressure of 3 mmHg. IAS/Shunts: No atrial level shunt detected by color flow Doppler. Agitated saline contrast was given intravenously to evaluate for intracardiac shunting.  LEFT VENTRICLE PLAX 2D LVIDd:         4.60 cm   Diastology LVIDs:         3.20 cm   LV e' medial:    3.81 cm/s LV PW:         1.10 cm   LV E/e' medial:  33.3 LV IVS:        1.30 cm   LV e' lateral:   4.87 cm/s LVOT diam:     2.00 cm   LV E/e' lateral: 26.1 LV SV:         80 LV SV Index:   42        2D Longitudinal  Strain LVOT Area:     3.14 cm  2D Strain GLS Avg:     -9.5 %                           3D Volume EF                          LV 3D EDV:   116.83 ml                          LV 3D ESV:   46.65 ml RIGHT VENTRICLE             IVC RV Basal diam:  4.10 cm     IVC diam: 1.80 cm RV S prime:     10.10 cm/s TAPSE (M-mode): 1.8 cm LEFT ATRIUM             Index LA diam:        4.90 cm 2.58 cm/m LA Vol (A2C):   82.1 ml  43.28 ml/m LA Vol (A4C):   99.6 ml 52.51 ml/m LA Biplane Vol: 94.6 ml 49.87 ml/m  AORTIC VALVE AV Area (Vmax):    2.34 cm AV Area (Vmean):   2.31 cm AV Area (VTI):     2.13 cm AV Vmax:           137.00 cm/s AV Vmean:          92.700 cm/s AV VTI:            0.374 m AV Peak Grad:      7.5 mmHg AV Mean Grad:      4.0 mmHg LVOT Vmax:         102.00 cm/s LVOT Vmean:        68.300 cm/s LVOT VTI:          0.254 m LVOT/AV VTI ratio: 0.68  AORTA Ao Root diam: 2.90 cm Ao Asc diam:  3.40 cm MITRAL VALVE MV Area (PHT): 1.74 cm     SHUNTS MV Area VTI:   2.00 cm     Systemic VTI:  0.25 m MV Peak grad:  7.8 mmHg     Systemic Diam: 2.00 cm MV Mean grad:  2.0 mmHg MV Vmax:       1.40 m/s MV Vmean:      57.7 cm/s MV Decel Time: 437 msec MV E velocity: 127.00 cm/s MV A velocity: 74.50 cm/s MV E/A ratio:  1.70 Aditya Sabharwal Electronically signed by Ria Commander Signature Date/Time: 01/13/2024/12:58:14 PM    Final    CT Angio Chest Pulmonary Embolism (PE) W or WO Contrast Result Date: 01/12/2024 CLINICAL DATA:  Pulmonary embolism (PE) suspected, high prob EXAM: CT ANGIOGRAPHY CHEST WITH CONTRAST TECHNIQUE: Multidetector CT imaging of the chest was performed using the standard protocol during bolus administration of intravenous contrast. Multiplanar CT image reconstructions and MIPs were obtained to evaluate the vascular anatomy. RADIATION DOSE REDUCTION: This exam was performed according to the departmental dose-optimization program which includes automated exposure control, adjustment of the mA and/or kV according to patient size and/or use of iterative reconstruction technique. CONTRAST:  60mL OMNIPAQUE  IOHEXOL  350 MG/ML SOLN COMPARISON:  12/31/2023 and previous FINDINGS: Cardiovascular: Heart size normal. No pericardial effusion. Good contrast opacification of the pulmonary arterial tree. Segmental filling defects in 2 left lower lobe branches. No right-sided pulmonary emboli. Heavy coronary arterial calcifications.  Incomplete contrast opacification  of the thoracic aorta, with no evidence of aneurysm. Scattered calcified plaque in the arch and descending thoracic aorta. Mediastinum/Nodes: No mediastinal mass or hematoma. Stable borderline enlarged right paratracheal node. Subcentimeter anterior mediastinal nodes. No definite hilar adenopathy. Lungs/Pleura: Persistent dense consolidation in the left lower lobe. Small left pleural effusion, slightly increased. Mild scattered ground-glass opacities in both upper lobes with somewhat geographic distribution, new since previous. Upper Abdomen: Subcentimeter calcified gallstone in the dependent aspect of the nondilated gallbladder. 4 mm calculus in the upper pole left renal collecting system, incompletely visualized. No acute findings. Musculoskeletal:   Right shoulder DJD.  No acute findings. Review of the MIP images confirms the above findings. IMPRESSION: 1. Positive for left lower lobe segmental pulmonary emboli. 2. Persistent dense left lower lobe consolidation with small left pleural effusion. 3. New mild scattered ground-glass opacities in both upper lobes. 4. Cholelithiasis. 5. Left nephrolithiasis. 6.  Aortic Atherosclerosis (ICD10-I70.0). Electronically Signed   By: JONETTA Faes M.D.   On: 01/12/2024 15:10   DG Chest Port 1 View Result Date: 01/12/2024 CLINICAL DATA:  Shortness of breath. EXAM: PORTABLE CHEST 1 VIEW COMPARISON:  01/03/2024 FINDINGS: Low lung volumes. The cardio pericardial silhouette is enlarged. Vascular congestion diffuse interstitial opacities suggesting edema. Retrocardiac left base atelectasis/infiltrate noted. No acute bony abnormality. Telemetry leads overlie the chest. IMPRESSION: 1. Low volume film with vascular congestion and diffuse interstitial opacities suggesting edema. 2. Retrocardiac left base atelectasis/infiltrate. Electronically Signed   By: Camellia Candle M.D.   On: 01/12/2024 09:32     ASSESSMENT/PLAN:  This is a very pleasant 63  year old female with stage III (T3, N3, M0) non small cell lung cancer, adenocarinoma. She presented with left lower lobe lung mass and mediastinal lymph nodes.  She was diagnosed in June 2025.  Her molecular studies will be sent out today or tomorrow.    Dr. Sherrod had a lengthly discussion with the patient today about her current condition and treatment options.  Dr. Sherrod recommended concurrent chemoradiation with carboplatin for an AUC of 2 and paclitaxel 45 mg/m weekly.  The patient is interested in this option and she is expected to undergo her first cycle of treatment on 02/28/2024.  The adverse side effects of treatment were discussed including but not limited to fatigue, nausea, vomiting, kidney, and liver dysfunction.   I will arrange for the patient to have a chemoeducation class prior to receiving her first cycle of chemotherapy.   I placed a referral to radiation oncology.    I sent prescriptions for Compazine  10 mg every 6 hours as needed for nausea.   The patient will follow-up in 3 weeks with her second week of treatment.   The patient was advised to call immediately if she has any concerning symptoms in the interval. The patient voices understanding of current disease status and treatment options and is in agreement with the current care plan. All questions were answered. The patient knows to call the clinic with any problems, questions or concerns. We can certainly see the patient much sooner if necessary    No orders of the defined types were placed in this encounter.   Jeraldin Fesler L Elain Wixon, PA-C 02/10/24  ADDENDUM: Hematology/Oncology Attending: I had a face-to-face encounter with the patient today.  I reviewed her records, lab, scan and recommended her care plan.  This is a very pleasant 63 years old African-American female recently diagnosed with a stage IIIc non-small cell lung cancer, adenocarcinoma presented with large left lower lobe  lung mass in addition  to bilateral hilar and mediastinal lymphadenopathy diagnosed in June 2025.  Molecular studies are still pending. The patient is here today for evaluation and discussion of her treatment options based on the recent imaging studies.  I discussed the MRI as well as the PET scan result with the patient and her husband.  The patient has stage IIIc non-small cell lung cancer.  I discussed with her the option of a course of concurrent chemoradiation for control of her disease.  She will be treated with weekly systemic chemotherapy with carboplatin AUC of 2 and 45 Mg/M2 Concurrent with Radiation for 6-7 Weeks.  I Discussed with the Patient the Adverse Effects of This Treatment Including but Not Limited to Alopecia, Myelosuppression, Nausea and Vomiting, Peripheral Neuropathy, Liver or Renal Dysfunction. She Will Have a Chemotherapy Education Class before the First Dose of Her Treatment.  Will Refer the Patient to Radiation Oncology for Discussion of the Radiotherapy Option. She Is Expected to Start the First Dose of This Treatment on February 28, 2024. Will See Her Back for Follow-Up Visit 1 Week after the Start of Her Treatment for Management Considerations. She Was Advised to Call Immediately If She Has Any Other concerning Symptoms in the Interval. The total time spent in the appointment was 30 minutes including review of chart and various tests results, discussions about plan of care and coordination of care plan . Disclaimer: This note was dictated with voice recognition software. Similar sounding words can inadvertently be transcribed and may be missed upon review. Sherrod MARLA Sherrod, MD

## 2024-02-11 ENCOUNTER — Telehealth: Payer: Self-pay | Admitting: *Deleted

## 2024-02-11 NOTE — Telephone Encounter (Signed)
 Mr. Meckes called office and LVM on patient's behalf:  She has 4 days left on current Eliquis  prescription and they were asked to call office when current Eliquis  prescription was low to see if it would be refilled. She still has small amount of blood in her urine the morning.   Attempted contact on both numbers provided.   LVM for Mr. Bergey on his cell with following information:  Dr. Sherrod is out of office today. The four days of Eliquis  will last until Monday when Dr. Sherrod returns and reviews message and question.  Per past MD notes, Mrs. Centner has had small amount of blood in urine in the morning in the past and it was noted by MD as caused by kidney stones. If it has increased in amount or if urine is not clearing during day please call the office back.

## 2024-02-14 ENCOUNTER — Other Ambulatory Visit: Payer: Self-pay | Admitting: Medical Oncology

## 2024-02-14 ENCOUNTER — Other Ambulatory Visit (HOSPITAL_BASED_OUTPATIENT_CLINIC_OR_DEPARTMENT_OTHER): Payer: Self-pay

## 2024-02-14 DIAGNOSIS — I2699 Other pulmonary embolism without acute cor pulmonale: Secondary | ICD-10-CM

## 2024-02-14 MED ORDER — APIXABAN 5 MG PO TABS: 5.0000 mg | ORAL_TABLET | Freq: Two times a day (BID) | ORAL | 3 refills | Status: DC

## 2024-02-15 ENCOUNTER — Inpatient Hospital Stay

## 2024-02-15 ENCOUNTER — Inpatient Hospital Stay (HOSPITAL_BASED_OUTPATIENT_CLINIC_OR_DEPARTMENT_OTHER): Admitting: Physician Assistant

## 2024-02-15 VITALS — BP 148/64 | HR 50 | Temp 97.6°F | Resp 18 | Wt 190.4 lb

## 2024-02-15 DIAGNOSIS — C3432 Malignant neoplasm of lower lobe, left bronchus or lung: Secondary | ICD-10-CM | POA: Diagnosis not present

## 2024-02-15 DIAGNOSIS — R319 Hematuria, unspecified: Secondary | ICD-10-CM | POA: Diagnosis not present

## 2024-02-15 DIAGNOSIS — Z7189 Other specified counseling: Secondary | ICD-10-CM | POA: Diagnosis not present

## 2024-02-15 DIAGNOSIS — Z87891 Personal history of nicotine dependence: Secondary | ICD-10-CM | POA: Diagnosis not present

## 2024-02-15 DIAGNOSIS — C3490 Malignant neoplasm of unspecified part of unspecified bronchus or lung: Secondary | ICD-10-CM

## 2024-02-15 DIAGNOSIS — Z7901 Long term (current) use of anticoagulants: Secondary | ICD-10-CM | POA: Diagnosis not present

## 2024-02-15 LAB — CBC WITH DIFFERENTIAL (CANCER CENTER ONLY)
Abs Immature Granulocytes: 0.02 K/uL (ref 0.00–0.07)
Basophils Absolute: 0.1 K/uL (ref 0.0–0.1)
Basophils Relative: 1 %
Eosinophils Absolute: 0.2 K/uL (ref 0.0–0.5)
Eosinophils Relative: 4 %
HCT: 38 % (ref 36.0–46.0)
Hemoglobin: 12.2 g/dL (ref 12.0–15.0)
Immature Granulocytes: 0 %
Lymphocytes Relative: 20 %
Lymphs Abs: 1.3 K/uL (ref 0.7–4.0)
MCH: 26.9 pg (ref 26.0–34.0)
MCHC: 32.1 g/dL (ref 30.0–36.0)
MCV: 83.7 fL (ref 80.0–100.0)
Monocytes Absolute: 0.6 K/uL (ref 0.1–1.0)
Monocytes Relative: 9 %
Neutro Abs: 4.3 K/uL (ref 1.7–7.7)
Neutrophils Relative %: 66 %
Platelet Count: 334 K/uL (ref 150–400)
RBC: 4.54 MIL/uL (ref 3.87–5.11)
RDW: 15.6 % — ABNORMAL HIGH (ref 11.5–15.5)
WBC Count: 6.5 K/uL (ref 4.0–10.5)
nRBC: 0 % (ref 0.0–0.2)

## 2024-02-15 LAB — CMP (CANCER CENTER ONLY)
ALT: 8 U/L (ref 0–44)
AST: 14 U/L — ABNORMAL LOW (ref 15–41)
Albumin: 3.7 g/dL (ref 3.5–5.0)
Alkaline Phosphatase: 70 U/L (ref 38–126)
Anion gap: 7 (ref 5–15)
BUN: 19 mg/dL (ref 8–23)
CO2: 30 mmol/L (ref 22–32)
Calcium: 9.6 mg/dL (ref 8.9–10.3)
Chloride: 104 mmol/L (ref 98–111)
Creatinine: 1.67 mg/dL — ABNORMAL HIGH (ref 0.44–1.00)
GFR, Estimated: 35 mL/min — ABNORMAL LOW (ref >60)
Glucose, Bld: 170 mg/dL — ABNORMAL HIGH (ref 70–99)
Potassium: 4.1 mmol/L (ref 3.5–5.1)
Sodium: 141 mmol/L (ref 135–145)
Total Bilirubin: 0.4 mg/dL (ref 0.0–1.2)
Total Protein: 7.6 g/dL (ref 6.5–8.1)

## 2024-02-15 MED ORDER — PROCHLORPERAZINE MALEATE 10 MG PO TABS: 10.0000 mg | ORAL_TABLET | Freq: Four times a day (QID) | ORAL | 2 refills | Status: AC | PRN

## 2024-02-15 NOTE — Patient Instructions (Signed)
-  There are two main categories of lung cancer, they are named based on the size of the cancer cell. One is called Non-Small cell lung cancer. The other type is Small Cell Lung Cancer -The sample (biopsy) that they took of your tumor was consistent with a subtype of Non-small cell lung cancer called adenocarcinoma.  -We covered a lot of important information at your appointment today regarding what the treatment plan is moving forward. Here are the the main points that were discussed at your office visit with us  today:  -The treatment that you will receive consists of two chemotherapy drugs called Carboplatin and Paclitaxel (also called Taxol) -We are planning on starting your treatment next week on 02/28/24 but before your start your treatment, I would like you to attend a Chemotherapy Education Class. This involves having you sit down with one of our nurse educators. She will discuss with you one-on-one more details about your treatment as well as general information about resources here at the [name].  -Your treatment will be given every week for about 6 weeks or so (as long as you are also receiving radiation). We will check your labs (blood work) once a week . Dr. Sherrod or I will see you every other treatment just to make sure that you are doing well and that everything is on track. -We will get a CT scan about 3 weeks after you complete your treatment  Medications:  -Compazine  was sent to your pharmacy. This medication is for nausea. You may take this every 6 hours as needed if you feel nausous.   Referrals -I will place the referral to radiation oncology to meet with you to discuss starting radiation treatment. Please be on the lookout for a phone call from them to schedule a consultation.   Follow Up:  -We will see you back for a follow up visit in 3 weeks with the second week of treatment.

## 2024-02-15 NOTE — Progress Notes (Addendum)
 START ON PATHWAY REGIMEN - Non-Small Cell Lung     A cycle is every 7 days, concurrent with RT:     Paclitaxel      Carboplatin   **Always confirm dose/schedule in your pharmacy ordering system**  Patient Characteristics: Preoperative or Nonsurgical Candidate (Clinical Staging), Stage IIB (N2a only) or Stage III - Nonsurgical Candidate, PS = 0,1 Therapeutic Status: Preoperative or Nonsurgical Candidate (Clinical Staging) AJCC T Category: cT3 AJCC N Category: cN3 AJCC M Category: cM0 AJCC 9 Stage Grouping: IIIC Check here if patient was staged using an edition other than AJCC Staging 9th Edition: false ECOG Performance Status: 1 Intent of Therapy: Curative Intent, Discussed with Patient

## 2024-02-16 ENCOUNTER — Other Ambulatory Visit: Payer: Self-pay

## 2024-02-16 LAB — ACID FAST CULTURE WITH REFLEXED SENSITIVITIES (MYCOBACTERIA): Acid Fast Culture: NEGATIVE

## 2024-02-16 NOTE — Progress Notes (Signed)
 Thoracic Location of Tumor / Histology: Left Lower Lobe Lung  Patient presented   PET 02/10/2024: The previously demonstrated left lower lobe mass demonstrates moderate hypermetabolic activity, consistent with primary bronchogenic carcinoma.  Intermediate activity within nonenlarged mediastinal lymph nodes, indeterminate for metastatic disease.   No definite evidence of metastatic disease in the abdomen or pelvis.  No definite osseous metastatic disease.   MRI Brain 02/10/2024: No evidence of intracranial metastatic disease.  Parenchymal atrophy with advanced chronic small vessel ischemic disease and multiple chronic infarcts, as described.  CTA Chest 01/12/2024:  Positive for left lower lobe segmental pulmonary emboli.  Persistent dense left lower lobe consolidation with small left pleural effusion.  New mild scattered ground-glass opacities in both upper lobes.   Biopsies of Left Lower Lobe Lung 01/03/2024    Past/Anticipated interventions by pulmonary, if any:  Past/Anticipated interventions by cardiothoracic surgery, if any:   Past/Anticipated interventions by medical oncology, if any:  Cassie Heilingoetter / Dr. Sherrod 02/15/2024 -Dr. Sherrod recommended concurrent chemoradiation with carboplatin for an AUC of 2 and paclitaxel 45 mg/m weekly.  -The patient is interested in this option and she is expected to undergo her first cycle of treatment on 02/28/2024.    Tobacco/Marijuana/Snuff/ETOH use:   Signs/Symptoms Weight changes, if any:  Respiratory complaints, if any:  Hemoptysis, if any:  Pain issues, if any:    SAFETY ISSUES: Prior radiation?  Pacemaker/ICD?   Possible current pregnancy? Is the patient on methotrexate?   Current Complaints / other details:

## 2024-02-17 ENCOUNTER — Ambulatory Visit
Admission: RE | Admit: 2024-02-17 | Discharge: 2024-02-17 | Disposition: A | Discharge status: Home / Self Care | Source: Ambulatory Visit | Attending: Radiation Oncology | Admitting: Radiation Oncology

## 2024-02-17 ENCOUNTER — Ambulatory Visit
Admission: RE | Admit: 2024-02-17 | Discharge: 2024-02-17 | Discharge status: Home / Self Care | Source: Ambulatory Visit | Attending: Radiation Oncology | Admitting: Radiation Oncology

## 2024-02-17 ENCOUNTER — Other Ambulatory Visit

## 2024-02-17 ENCOUNTER — Encounter: Payer: Self-pay | Admitting: Radiation Oncology

## 2024-02-17 VITALS — BP 142/66 | HR 46 | Temp 96.2°F | Resp 18 | Ht 63.0 in

## 2024-02-17 DIAGNOSIS — Z87442 Personal history of urinary calculi: Secondary | ICD-10-CM | POA: Diagnosis not present

## 2024-02-17 DIAGNOSIS — I252 Old myocardial infarction: Secondary | ICD-10-CM | POA: Insufficient documentation

## 2024-02-17 DIAGNOSIS — Z809 Family history of malignant neoplasm, unspecified: Secondary | ICD-10-CM | POA: Diagnosis not present

## 2024-02-17 DIAGNOSIS — I2510 Atherosclerotic heart disease of native coronary artery without angina pectoris: Secondary | ICD-10-CM | POA: Insufficient documentation

## 2024-02-17 DIAGNOSIS — Z87891 Personal history of nicotine dependence: Secondary | ICD-10-CM | POA: Insufficient documentation

## 2024-02-17 DIAGNOSIS — I129 Hypertensive chronic kidney disease with stage 1 through stage 4 chronic kidney disease, or unspecified chronic kidney disease: Secondary | ICD-10-CM | POA: Insufficient documentation

## 2024-02-17 DIAGNOSIS — Z79899 Other long term (current) drug therapy: Secondary | ICD-10-CM | POA: Insufficient documentation

## 2024-02-17 DIAGNOSIS — I700 Atherosclerosis of aorta: Secondary | ICD-10-CM | POA: Diagnosis not present

## 2024-02-17 DIAGNOSIS — E785 Hyperlipidemia, unspecified: Secondary | ICD-10-CM | POA: Insufficient documentation

## 2024-02-17 DIAGNOSIS — E1122 Type 2 diabetes mellitus with diabetic chronic kidney disease: Secondary | ICD-10-CM | POA: Insufficient documentation

## 2024-02-17 DIAGNOSIS — R222 Localized swelling, mass and lump, trunk: Secondary | ICD-10-CM | POA: Diagnosis not present

## 2024-02-17 DIAGNOSIS — Z7982 Long term (current) use of aspirin: Secondary | ICD-10-CM | POA: Insufficient documentation

## 2024-02-17 DIAGNOSIS — Z794 Long term (current) use of insulin: Secondary | ICD-10-CM | POA: Insufficient documentation

## 2024-02-17 DIAGNOSIS — Z8673 Personal history of transient ischemic attack (TIA), and cerebral infarction without residual deficits: Secondary | ICD-10-CM | POA: Diagnosis not present

## 2024-02-17 DIAGNOSIS — K8020 Calculus of gallbladder without cholecystitis without obstruction: Secondary | ICD-10-CM | POA: Diagnosis not present

## 2024-02-17 DIAGNOSIS — C3432 Malignant neoplasm of lower lobe, left bronchus or lung: Secondary | ICD-10-CM | POA: Insufficient documentation

## 2024-02-17 DIAGNOSIS — I6523 Occlusion and stenosis of bilateral carotid arteries: Secondary | ICD-10-CM | POA: Insufficient documentation

## 2024-02-17 DIAGNOSIS — N200 Calculus of kidney: Secondary | ICD-10-CM | POA: Diagnosis not present

## 2024-02-17 DIAGNOSIS — J449 Chronic obstructive pulmonary disease, unspecified: Secondary | ICD-10-CM | POA: Insufficient documentation

## 2024-02-17 DIAGNOSIS — Z7901 Long term (current) use of anticoagulants: Secondary | ICD-10-CM | POA: Diagnosis not present

## 2024-02-17 DIAGNOSIS — N189 Chronic kidney disease, unspecified: Secondary | ICD-10-CM | POA: Insufficient documentation

## 2024-02-17 NOTE — Progress Notes (Signed)
 Radiation Oncology         [phone] ________________________________  Name: Kelly Gibson        MRN: [id number]  Date of Service: 02/17/2024 DOB: 02-02-1961  RR:Zjmob, [name], NP  Sherrod Sherrod, MD     REFERRING PHYSICIAN: Sherrod Sherrod, MD   DIAGNOSIS: The encounter diagnosis was Malignant neoplasm of lower lobe of left lung (HCC).   HISTORY OF PRESENT ILLNESS: Kelly Gibson is a 63 y.o. female seen at the request of Dr. Sherrod for a new diagnosis of lung cancer.  She started having dyspnea in May 2025 while on a vacation to [place].  Her symptoms persisted leading to a hospitalization where she was diagnosed with a MI.  She was transferred to [facility] for further evaluation and CT imaging on 12/31/2023 showed a mass in the left lower lobe measuring up to 6 cm as well as concern for pretracheal lymph node in the right mediastinal station measuring 1.4 cm.  She underwent an inpatient bronchoscopy on 01/03/2024 and final pathology showed non-small cell lung cancer consistent with adenocarcinoma on the fine-needle aspirate and brushings of the left lower lobe mass, navigation was performed to navigate to the 4R lymph node chain but fine-needle aspirate showed nondiagnostic material that could not diagnose the concerns for her disease.  She underwent an outpatient PET scan on 02/10/2024 which confirmed hypermetabolic activity in the left lower lobe mass measuring 5.9 cm with an SUV of 11.8 and a mediastinal blood pool of 2.3, and the mediastinum the right paratracheal lymph node had an SUV of 5.3 in AP window lymph node had an SUV of 6.2 in the subcarinal lymph node an SUV of 6.1, she also had an MRI of the brain that day that did not show any focal findings for metastatic disease.  She met with Dr. Sherrod is recommending chemoradiation and is in the process of getting scheduled to start this on 02/28/2024.  She is seen to discuss the radiotherapy component.  Also of note she will  continue to follow-up with her cardiology team as there were recommendations for additional cardiac procedures.    PREVIOUS RADIATION THERAPY: No   PAST MEDICAL HISTORY:  Past Medical History:  Diagnosis Date   Bradycardia    SR/SB with up to 3.5 second pause (asymptomatic) on ~ 10/2017 event monitor; saw EP Dr. Kelsie: avoid AV nodal blocking agents   Chronic kidney disease    COPD (chronic obstructive pulmonary disease) (HCC)    Depression    Depression with anxiety 08/29/2021   Diabetes mellitus    Dysarthria, post-stroke    Dysrhythmia    Ganglion cyst 03/09/2012   Heart block AV complete (HCC) 12/11/2023   History of kidney stones    Hyperlipidemia    Hypertension    Hypokalemia    Myocardial infarction [facility])    NSTEMI   Non-ST elevation (NSTEMI) myocardial infarction (HCC) 12/11/2023   Obesity, unspecified 12/17/2011   Pneumonia    Pneumonia of left lower lobe due to infectious organism 12/12/2023   PONV (postoperative nausea and vomiting)    Skin lesion of face 11/17/2022   Stroke (HCC)    2019   right side hemiparesis       PAST SURGICAL HISTORY: Past Surgical History:  Procedure Laterality Date   CESAREAN SECTION     x 3   LEFT HEART CATH AND CORONARY ANGIOGRAPHY N/A 12/28/2023   Procedure: LEFT HEART CATH AND CORONARY ANGIOGRAPHY;  Surgeon: Elmira Newman PARAS,  MD;  Location: [facility] INVASIVE CV LAB;  Service: Cardiovascular;  Laterality: N/A;   ORIF WRIST FRACTURE Right 05/04/2018   Procedure: OPEN REDUCTION INTERNAL FIXATION (ORIF) RIGHT  WRIST FRACTURE;  Surgeon: Cristy Bonner DASEN, MD;  Location: [facility] OR;  Service: Orthopedics;  Laterality: Right;   TUMOR REMOVAL     left shoulder   VIDEO BRONCHOSCOPY WITH ENDOBRONCHIAL NAVIGATION  01/03/2024   Procedure: VIDEO BRONCHOSCOPY WITH ENDOBRONCHIAL NAVIGATION;  Surgeon: Shelah Lamar RAMAN, MD;  Location: [facility] ENDOSCOPY;  Service: Pulmonary;;   VIDEO BRONCHOSCOPY WITH ENDOBRONCHIAL ULTRASOUND Bilateral 01/03/2024   Procedure:  BRONCHOSCOPY, WITH EBUS;  Surgeon: Shelah Lamar RAMAN, MD;  Location: [facility] ENDOSCOPY;  Service: Pulmonary;  Laterality: Bilateral;     FAMILY HISTORY:  Family History  Problem Relation Age of Onset   Diabetes Mother    Stroke Mother    Heart disease Father    Cancer Father    Stroke Father      SOCIAL HISTORY:  reports that she has quit smoking. Her smoking use included cigarettes. She has a 20 pack-year smoking history. She has never used smokeless tobacco. She reports that she does not drink alcohol and does not use drugs.  The patient has been wheelchair-bound since her diagnosis of stroke for approximately 5 years.  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  She enjoys spending time with family, going to flea markets, and traveling.  She is married and accompanied by her husband, they reside in [place].    ALLERGIES: Latex and Niacin   MEDICATIONS:  Current Outpatient Medications  Medication Sig Dispense Refill   albuterol  (PROVENTIL ) (2.5 MG/3ML) 0.083% nebulizer solution Inhale 3 mLs (2.5 mg total) into the lungs every 6 (six) hours as needed for wheezing or shortness of breath. 75 mL 12   albuterol  (VENTOLIN  HFA) 108 (90 Base) MCG/ACT inhaler Inhale 2 puffs into the lungs every 6 (six) hours as needed for wheezing or shortness of breath. 18 g 2   amLODipine  (NORVASC ) 5 MG tablet Take 5 mg by mouth daily.     apixaban  (ELIQUIS ) 5 MG TABS tablet Take 1 tablet (5 mg total) by mouth 2 (two) times daily. 60 tablet 3   aspirin  EC 81 MG tablet Take 1 tablet (81 mg total) by mouth daily. Swallow whole. 30 tablet 12   budesonide -glycopyrrolate -formoterol  (BREZTRI  AEROSPHERE) 160-9-4.8 MCG/ACT AERO inhaler Inhale 2 puffs into the lungs 2 (two) times daily. 10.7 g 1   cefadroxil  (DURICEF) 500 MG capsule Take 1 capsule (500 mg total) by mouth daily. 5 capsule 0   citalopram  (CELEXA ) 10 MG tablet TAKE 1  TABLET BY MOUTH DAILY 90 tablet 1   Continuous Glucose Sensor (DEXCOM G7 SENSOR) MISC Take 1 Device by mouth See admin instructions. Change sensor every 10 days     Doxylamine  Succinate, Sleep, (UNISOM  PO) Take 2 each by mouth at bedtime. Gel capsules     furosemide  (LASIX ) 20 MG tablet Take 1 tablet (20 mg total) by mouth daily as needed for fluid or edema (take if daily weight is greater than 2 lbs from the previous day's weight.). 90 tablet 0   HUMALOG  KWIKPEN 100 UNIT/ML KwikPen INJECT 3 TO 5 UNITS INTO THE SKIN DAILY BEFORE LUNCH 6 mL 3   hydrALAZINE  (APRESOLINE ) 100 MG tablet 1 tablet up to every 12 hours only as needed for blood pressure >150/90     insulin  degludec (TRESIBA  FLEXTOUCH) 100 UNIT/ML FlexTouch Pen Inject  10 Units into the skin 2 (two) times daily. Adjust by 2 units every 3 days as needed for blood sugars higher than 150 in the morning fasting. 9 mL 11   Melatonin 5 MG CHEW Chew 10 mg by mouth at bedtime.     Multiple Vitamins-Minerals (MULTIVITAMIN GUMMIES WOMENS) CHEW Chew 2 each by mouth in the morning.     olmesartan (BENICAR) 40 MG tablet Take 40 mg by mouth in the morning.     prochlorperazine  (COMPAZINE ) 10 MG tablet Take 1 tablet (10 mg total) by mouth every 6 (six) hours as needed. 30 tablet 2   rosuvastatin  (CRESTOR ) 40 MG tablet TAKE 1 TABLET BY MOUTH DAILY 90 tablet 3   UNABLE TO FIND Take 1 tablet by mouth with breakfast, with lunch, and with evening meal. GlucoGold -Berberine, Concentrated Cinnamon, Chromium, Banaba Leaf Extract     No current facility-administered medications for this encounter.     REVIEW OF SYSTEMS: On review of systems, the patient reports that she is doing okay overall.  She still has shortness of breath at  rest, but states that she is not having any difficulty at the moment.  She does have shortness of breath with exertional activity as well as some wheezing, she is not having any cough but had intermittent chest pains that are  self-limiting.  She has lost several pounds but states that her weight does fluctuate to some degree.  No other complaints are verbalized.   PHYSICAL EXAM:  Wt Readings from Last 3 Encounters:  02/15/24 190 lb 6.4 oz (86.4 kg)  01/27/24 190 lb (86.2 kg)  01/21/24 190 lb 12.8 oz (86.5 kg)   Temp Readings from Last 3 Encounters:  02/17/24 (!) 96.2 F (35.7 C) (Temporal)  02/15/24 97.6 F (36.4 C) (Temporal)  01/27/24 97.7 F (36.5 C) (Temporal)   BP Readings from Last 3 Encounters:  02/17/24 (!) 142/66  02/15/24 (!) 148/64  01/27/24 127/75   Pulse Readings from Last 3 Encounters:  02/17/24 (!) 46  02/15/24 (!) 50  01/27/24 (!) 55   Pain Assessment Pain Score: 0-No pain/10  In general this is a well appearing African-American female in a wheelchair who has contracture of her right hand and deficits of the right upper extremity from prior CVA. in no acute distress. She's alert and oriented x4 and appropriate throughout the examination. Cardiopulmonary assessment is negative for acute distress and she exhibits normal effort but with talking this seems to bring on some mild tachypnea.    ECOG = 1  0 - Asymptomatic (Fully active, able to carry on all predisease activities without restriction)  1 - Symptomatic but completely ambulatory (Restricted in physically strenuous activity but ambulatory and able to carry out work of a light or sedentary nature. For example, light housework, office work)  2 - Symptomatic, <50% in bed during the day (Ambulatory and capable of all self care but unable to carry out any work activities. Up and about more than 50% of waking hours)  3 - Symptomatic, >50% in bed, but not bedbound (Capable of only limited self-care, confined to bed or chair 50% or more of waking hours)  4 - Bedbound (Completely disabled. Cannot carry on any self-care. Totally confined to bed or chair)  5 - Death   Raylene MM, Creech RH, Tormey DC, et al. [phone]). Toxicity and  response criteria of the [facility]. Am. DOROTHA Bridges. Oncol. 5 (6): 649-55    LABORATORY DATA:  Lab Results  Component Value Date   WBC 6.5 02/15/2024   HGB 12.2 02/15/2024   HCT 38.0 02/15/2024   MCV 83.7 02/15/2024   PLT 334 02/15/2024   Lab Results  Component Value Date   NA 141 02/15/2024   K 4.1 02/15/2024   CL 104 02/15/2024   CO2 30 02/15/2024   Lab Results  Component Value Date   ALT 8 02/15/2024   AST 14 (L) 02/15/2024   ALKPHOS 70 02/15/2024   BILITOT 0.4 02/15/2024      RADIOGRAPHY: NM PET Image Initial (PI) Skull Base To Thigh (F-18 FDG) Result Date: 02/12/2024 CLINICAL DATA:  Initial treatment strategy for non-small cell lung cancer. EXAM: NUCLEAR MEDICINE PET SKULL BASE TO THIGH TECHNIQUE: 9.48 mCi F-18 FDG was injected intravenously. Full-ring PET imaging was performed from the skull base to thigh after the radiotracer. CT data was obtained and used for attenuation correction and anatomic localization. Fasting blood glucose: 80 mg/dl COMPARISON:  Chest CTA 01/12/2024. Chest CT 12/31/2023. Abdominopelvic CT 12/12/2023. FINDINGS: Mediastinal blood pool activity: SUV max 2.3 NECK: No hypermetabolic cervical lymph nodes are identified. No suspicious activity identified within the pharyngeal mucosal space. Incidental CT findings: Bilateral carotid atherosclerosis. CHEST: The previously demonstrated left lower lobe mass demonstrates moderate hypermetabolic activity with an SUV max of 11.8. This lesion measures approximately 5.9 x 4.2 cm on image 46/7. No other hypermetabolic pulmonary activity or suspicious nodularity. Intermediate activity within nonenlarged mediastinal lymph nodes, including a right paratracheal node (SUV max 5.3), in AP window node (SUV max 6.2), and a subcarinal node (SUV max 6.1). No axillary adenopathy. Incidental CT findings: Atherosclerosis of the aorta, great vessels and coronary arteries. Resolution of previously demonstrated left  pleural effusion. There is improved aeration of both lung bases. ABDOMEN/PELVIS: There is no hypermetabolic activity within the liver, adrenal glands, spleen or pancreas. There is no hypermetabolic nodal activity in the abdomen or pelvis. Incidental CT findings: Small gallstone. Left renal staghorn calculus without hydronephrosis. Aortic and branch vessel atherosclerosis. SKELETON: There is no hypermetabolic activity to strongly suggest osseous metastatic disease. There is a small focus of hypermetabolic activity near the left 2nd costovertebral junction (SUV max 5.9), without clear corresponding abnormality on the CT images. Incidental CT findings: Mild spondylosis. IMPRESSION: 1. The previously demonstrated left lower lobe mass demonstrates moderate hypermetabolic activity, consistent with primary bronchogenic carcinoma. 2. Intermediate activity within nonenlarged mediastinal lymph nodes, indeterminate for metastatic disease. 3. No definite evidence of metastatic disease in the abdomen or pelvis. 4. No definite osseous metastatic disease. Small focus of hypermetabolic activity near the left 2nd costovertebral junction without clear corresponding abnormality on the CT images, favored to be degenerative. 5. Resolution of previously demonstrated left pleural effusion. 6.  Aortic Atherosclerosis (ICD10-I70.0). Electronically Signed   By: Elsie Perone M.D.   On: 02/12/2024 17:41   MR BRAIN W WO CONTRAST Result Date: 02/11/2024 CLINICAL DATA:  Provided history: Malignant neoplasm of unspecified part of unspecified bronchus or lung. Non-small cell lung cancer, staging. EXAM: MRI HEAD WITHOUT AND WITH CONTRAST TECHNIQUE: Multiplanar, multiecho pulse sequences of the brain and surrounding structures were obtained without and with intravenous contrast. CONTRAST:  9mL GADAVIST  GADOBUTROL  1 MMOL/ML IV SOLN COMPARISON:  Head CT 12/07/2017.  Brain MRI 12/05/2017. FINDINGS: Brain: Generalized cerebral atrophy. As before,  there are fairly numerous chronic lacunar infarcts within the bilateral cerebral hemispheric white matter and within/about the bilateral deep gray nuclei. A chronic lacunar infarct within the anterior left frontal lobe periventricular white matter is new  from the prior MRI of 12/05/2017. Chronic ischemic changes also affect the body of the corpus callosum, unchanged. Background multifocal T2 FLAIR hyperintense signal abnormality within the cerebral white matter, nonspecific but compatible chronic small vessel ischemic disease. Chronic lacunar infarcts within the pons, with background pontine chronic small vessel ischemic disease, not significantly changed. Several nonspecific chronic hemorrhages scattered within the supratentorial and infratentorial brain. There is no acute infarct. No evidence of an intracranial mass. No extra-axial fluid collection. No midline shift. No pathologic intracranial enhancement identified. Vascular: Maintained flow voids within the proximal large arterial vessels. Skull and upper cervical spine: No focal worrisome marrow lesion. Sinuses/Orbits: No mass or acute finding within the imaged orbits. No significant paranasal sinus disease. IMPRESSION: 1. No evidence of intracranial metastatic disease. 2. Parenchymal atrophy with advanced chronic small vessel ischemic disease and multiple chronic infarcts, as described. Electronically Signed   By: Rockey Childs D.O.   On: 02/11/2024 08:32       IMPRESSION/PLAN: 1. Stage IIIC, cT3N3M0, NSCLC, adenocarcinoma of the LLL. Dr. Dewey discusses the pathology findings and reviews the nature of locally advanced lung cancer.  Dr. Dewey agrees with the recommendations Dr. Sherrod is given for chemoradiation.  We discussed the risks, benefits, short, and long term effects of radiotherapy, as well as the curative intent, and the patient is interested in proceeding. Dr. Dewey discusses the delivery and logistics of radiotherapy and anticipates a course  of 6 1/2 weeks of radiotherapy to the LLL and regional nodes. Written consent is obtained and placed in the chart, a copy was provided to the patient. The patient will be contacted to coordinate treatment planning by our simulation department. We intend to start her treatment on 02/28/24. 2. Range of motion limitations given prior CVA.  The patient does have right upper extremity deficits and we will work with her on coming up with the most comfortable position to tolerate her radiation in, and our staff will help her get onto the table each day. 3. Diabetes with continuous glucose monitor.  We discussed that she can continue using her glucose monitor but given the concern for theoretical changes to the function of the device from radiation, she should double check any unusual glucose readings with fingersticks.  The alternative would be to remove the device and use fingerstick checks for the 6-1/2 weeks of treatment but she prefers to try and use her device.  In a visit lasting 60 minutes, greater than 50% of the time was spent face to face discussing the patient's condition, in preparation for the discussion, and coordinating the patient's care.   The above documentation reflects my direct findings during this shared patient visit. Please see the separate note by Dr. Dewey on this date for the remainder of the patient's plan of care.    Donald KYM Husband, [facility]   **Disclaimer: This note was dictated with voice recognition software. Similar sounding words can inadvertently be transcribed and this note may contain transcription errors which may not have been corrected upon publication of note.**

## 2024-02-18 DIAGNOSIS — Z87891 Personal history of nicotine dependence: Secondary | ICD-10-CM | POA: Diagnosis not present

## 2024-02-18 DIAGNOSIS — C3432 Malignant neoplasm of lower lobe, left bronchus or lung: Secondary | ICD-10-CM | POA: Diagnosis not present

## 2024-02-21 NOTE — Progress Notes (Signed)
 Pharmacist Chemotherapy Monitoring - Initial Assessment    Anticipated start date: 02/28/24   The following has been reviewed per standard work regarding the patient's treatment regimen: The patient's diagnosis, treatment plan and drug doses, and organ/hematologic function Lab orders and baseline tests specific to treatment regimen  The treatment plan start date, drug sequencing, and pre-medications Prior authorization status  Patient's documented medication list, including drug-drug interaction screen and prescriptions for anti-emetics and supportive care specific to the treatment regimen The drug concentrations, fluid compatibility, administration routes, and timing of the medications to be used The patient's access for treatment and lifetime cumulative dose history, if applicable  The patient's medication allergies and previous infusion related reactions, if applicable   Changes made to treatment plan:  N/A  Follow up needed:  Pending authorization for treatment    Wilma Dollar, Pharm.D., CPP 02/21/2024@9 :54 AM

## 2024-02-22 ENCOUNTER — Inpatient Hospital Stay

## 2024-02-22 ENCOUNTER — Ambulatory Visit
Admission: RE | Admit: 2024-02-22 | Discharge: 2024-02-22 | Disposition: A | Discharge status: Home / Self Care | Source: Ambulatory Visit | Attending: Radiation Oncology | Admitting: Radiation Oncology

## 2024-02-22 ENCOUNTER — Encounter (HOSPITAL_COMMUNITY): Payer: Self-pay | Admitting: Internal Medicine

## 2024-02-22 DIAGNOSIS — C3432 Malignant neoplasm of lower lobe, left bronchus or lung: Secondary | ICD-10-CM

## 2024-02-22 DIAGNOSIS — C3490 Malignant neoplasm of unspecified part of unspecified bronchus or lung: Secondary | ICD-10-CM | POA: Diagnosis not present

## 2024-02-22 DIAGNOSIS — Z87891 Personal history of nicotine dependence: Secondary | ICD-10-CM | POA: Insufficient documentation

## 2024-02-23 DIAGNOSIS — E7800 Pure hypercholesterolemia, unspecified: Secondary | ICD-10-CM | POA: Diagnosis not present

## 2024-02-23 DIAGNOSIS — N1831 Chronic kidney disease, stage 3a: Secondary | ICD-10-CM | POA: Diagnosis not present

## 2024-02-23 DIAGNOSIS — E1165 Type 2 diabetes mellitus with hyperglycemia: Secondary | ICD-10-CM | POA: Diagnosis not present

## 2024-02-23 DIAGNOSIS — J440 Chronic obstructive pulmonary disease with acute lower respiratory infection: Secondary | ICD-10-CM | POA: Diagnosis not present

## 2024-02-23 DIAGNOSIS — G459 Transient cerebral ischemic attack, unspecified: Secondary | ICD-10-CM | POA: Diagnosis not present

## 2024-02-23 DIAGNOSIS — I69353 Hemiplegia and hemiparesis following cerebral infarction affecting right non-dominant side: Secondary | ICD-10-CM | POA: Diagnosis not present

## 2024-02-23 DIAGNOSIS — I10 Essential (primary) hypertension: Secondary | ICD-10-CM | POA: Diagnosis not present

## 2024-02-23 DIAGNOSIS — N189 Chronic kidney disease, unspecified: Secondary | ICD-10-CM | POA: Diagnosis not present

## 2024-02-24 ENCOUNTER — Inpatient Hospital Stay: Admitting: Licensed Clinical Social Worker

## 2024-02-24 ENCOUNTER — Other Ambulatory Visit

## 2024-02-24 ENCOUNTER — Other Ambulatory Visit (INDEPENDENT_AMBULATORY_CARE_PROVIDER_SITE_OTHER)

## 2024-02-24 DIAGNOSIS — Z794 Long term (current) use of insulin: Secondary | ICD-10-CM

## 2024-02-24 DIAGNOSIS — C3432 Malignant neoplasm of lower lobe, left bronchus or lung: Secondary | ICD-10-CM

## 2024-02-24 DIAGNOSIS — E1165 Type 2 diabetes mellitus with hyperglycemia: Secondary | ICD-10-CM

## 2024-02-24 NOTE — Progress Notes (Signed)
 [facility] Clinical Social Work  Initial Assessment   Kelly Gibson is a 63 y.o. year old female contacted by phone. Clinical Social Work was referred by medical provider for assessment of psychosocial needs.   SDOH (Social Determinants of Health) assessments performed: Yes SDOH Interventions    Flowsheet Row Telephone from 01/17/2024 in [name] [facility]PARTMENT Clinical Support from 04/19/2021 in [facility] Office Visit from 11/04/2020 in [facility]  SDOH Interventions     Food Insecurity Interventions Intervention Not Indicated Intervention Not Indicated --  Housing Interventions Intervention Not Indicated Intervention Not Indicated --  Transportation Interventions Intervention Not Indicated Intervention Not Indicated --  Depression Interventions/Treatment  -- -- Referral to Psychiatry  Financial Strain Interventions -- Intervention Not Indicated --  Stress Interventions -- Intervention Not Indicated --  Social Connections Interventions Community Resources Provided Refer to Congregational Nursing --  Health Literacy Interventions Other (Comment), Intervention Not Indicated -- --    SDOH Screenings   Food Insecurity: No Food Insecurity (01/17/2024)  Housing: Low Risk  (01/17/2024)  Transportation Needs: No Transportation Needs (01/17/2024)  Utilities: Not At Risk (01/12/2024)  Alcohol Screen: Low Risk  (04/11/2019)  Depression (PHQ2-9): Low Risk  (01/10/2024)  Financial Resource Strain: Medium Risk (01/07/2024)  Physical Activity: Inactive (01/07/2024)  Social Connections: Unknown (01/17/2024)  Stress: No Stress Concern Present (01/07/2024)  Tobacco Use: Medium Risk (02/17/2024)  Health Literacy: Adequate Health Literacy (01/17/2024)     Distress Screen completed: Yes    02/22/2024   10:28 AM  ONCBCN DISTRESS SCREENING  Screening Type Initial Screening  How much distress have you been experiencing in the past week? (0-10) 7  Practical concerns  type Taking care of myself;Safety;Transportation;Treatment decisions  Social concerns type Relationship with spouse or partner;Communication with health care team  Emotional concerns type Worry or anxiety;Sadness or depression;Grief or loss;Fear;Anger;Changes in appearance;Feelings of worthlessness or being a burden  Spiritual/Religous concerns type Death, dying, or afterlife;Conflict between beliefs and cancer treatments  Physical Concerns Type  Pain;Fatigue;Changes in eating;Loss or change of physical abilities      Family/Social Information:  Housing Arrangement: patient lives with her husband, 1 daughter and her brother in-law.  Pt has a Pmhx of a previous stroke which resulted in right sided impairment Family members/support persons in your life? Pt has 2 daughters residing in [place].  Per pt she has family available to assist her as needed. 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  Income source: Actor concerns: No Type of concern: None Food access concerns: no Religious or spiritual practice: Yes-[employer] Advanced directives: Yes-scanned in chart Services Currently in place:  none  Coping/ Adjustment to diagnosis: Patient understands treatment plan and what happens next? yes Concerns about diagnosis and/or treatment: Overwhelmed by information, Afraid of cancer, and Quality of life Patient reported stressors: Anxiety/ nervousness and Adjusting to my illness Hopes and/or priorities: pt's priority is to start treatment w/ the hope of positive results Patient enjoys time with family/ friends Current coping skills/ strengths: Motivation for treatment/growth  and Supportive family/friends     SUMMARY: Current SDOH Barriers:  No barriers identified at this time.  Clinical Social Work Clinical Goal(s):  No clinical social work  goals at this time  Interventions: Discussed common feeling and emotions when being diagnosed with cancer, and the importance of support during treatment Informed patient of the support team roles and  support services at [facility] Provided CSW contact information and encouraged patient to call with any questions or concerns CSW spoke w/ pt regarding her distress screen.  Pt reports it has been very overwhelming going through diagnosis and getting started into treatment.  Emotional support provided.  Pt informed of lung cancer support group as well as individual counseling for additional support if needed.     Follow Up Plan: Patient will contact CSW with any support or resource needs Patient verbalizes understanding of plan: Yes    Devere JONELLE Manna, LCSW Clinical Social Worker [facility]

## 2024-02-24 NOTE — Progress Notes (Signed)
 02/24/2024 Name: Kelly Gibson MRN: [id number] DOB: Aug 19, 1960  Chief Complaint  Patient presents with   Medication Management   Diabetes    Kelly Gibson is a 63 y.o. year old female who presented for a telephone visit.   They were referred to the pharmacist by their PCP for assistance in managing diabetes, medication access, and complex medication management.    Subjective:  Care Team: Primary Care Provider: Oris [name], Kelly Gibson ; Next Scheduled Visit: 05/15/24  Medication Access/Adherence  Current Pharmacy:  [facility] [id number] - [place], [place] - [address] [place] [address] Phone: [phone] Fax: [phone]  [facility] [address] [place] [address] Phone: [phone] Fax: [phone]   Patient reports affordability concerns with their medications: Yes  - tradjenta  in past but was discontinued in the last 1-2 weeks at most recent hospitalization; also paying over $100 per month on insulin  therapy and inhaler Patient reports access/transportation concerns to their pharmacy: No  Patient reports adherence concerns with their medications:  No     Diabetes:  Current medications: Tresiba  22 units in morning, 10 units at night; Humalog  SS  Medications tried in the past: Tradjenta , Glyburide , Metformin   Current glucose readings:   Observed patterns:  Patient denies hypoglycemic s/sx including dizziness, shakiness, sweating. Patient denies hyperglycemic symptoms including polyuria, polydipsia, polyphagia, nocturia, neuropathy, blurred vision.   Current medication access support: None   Objective:  Lab Results  Component Value Date   HGBA1C 7.3 (H) 01/10/2024    Lab Results  Component Value Date   CREATININE 1.67 (H) 02/15/2024   BUN 19 02/15/2024   NA 141 02/15/2024   K 4.1 02/15/2024   CL 104 02/15/2024   CO2 30 02/15/2024    Lab Results  Component Value  Date   CHOL 159 01/10/2024   HDL 62 01/10/2024   LDLCALC 71 01/10/2024   LDLDIRECT 70 01/10/2024   TRIG 157 (H) 01/10/2024   CHOLHDL 2.9 01/30/2022    Medications Reviewed Today     Reviewed by Kelly Gibson, RPH (Pharmacist) on 02/24/24 at 1010  Med List Status: <None>   Medication Order Taking? Sig Documenting Provider Last Dose Status Informant  albuterol  (PROVENTIL ) (2.5 MG/3ML) 0.083% nebulizer solution [id number]  Inhale 3 mLs (2.5 mg total) into the lungs every 6 (six) hours as needed for wheezing or shortness of breath. Kelly Pore, Kelly Gibson  Active Spouse/Significant Other, Pharmacy Records  albuterol  (VENTOLIN  HFA) [phone] Base) MCG/ACT inhaler [id number]  Inhale 2 puffs into the lungs every 6 (six) hours as needed for wheezing or shortness of breath. Kelly Gibson., Kelly Gibson  Active Spouse/Significant Other, Pharmacy Records  amLODipine  (NORVASC ) 5 MG tablet [id number]  Take 5 mg by mouth daily. Provider, Historical, Kelly Gibson  Active   apixaban  (ELIQUIS ) 5 MG TABS tablet [id number]  Take 1 tablet (5 mg total) by mouth 2 (two) times daily. Kelly Sherrod, Kelly Gibson  Active   aspirin  EC 81 MG tablet [id number]  Take 1 tablet (81 mg total) by mouth daily. Swallow whole. Kelly Gibson., Kelly Gibson  Active Spouse/Significant Other, Pharmacy Records  budesonide -glycopyrrolate -formoterol  (BREZTRI  AEROSPHERE) 160-9-4.8 MCG/ACT AERO inhaler [id number]  Inhale 2 puffs into the lungs 2 (two) times daily. Kelly Gibson., Kelly Gibson  Active Spouse/Significant Other, Pharmacy Records  cefadroxil  (DURICEF) 500 MG capsule [id number]  Take 1 capsule (500 mg total) by mouth daily. Kelly Gibson  Active  citalopram  (CELEXA ) 10 MG tablet [id number]  TAKE 1 TABLET BY MOUTH DAILY Kelly Gibson  Active Spouse/Significant Other, Pharmacy Records  Continuous Glucose Sensor (DEXCOM G7 SENSOR) MISC [id number]  Take 1 Device by mouth See admin instructions. Change sensor every 10 days Provider, Historical, Kelly Gibson   Active Spouse/Significant Other, Pharmacy Records  Doxylamine  Succinate, Sleep, (UNISOM  PO) [id number]  Take 2 each by mouth at bedtime. Gel capsules Provider, Historical, Kelly Gibson  Active Spouse/Significant Other, Pharmacy Records  furosemide  (LASIX ) 20 MG tablet [id number]  Take 1 tablet (20 mg total) by mouth daily as needed for fluid or edema (take if daily weight is greater than 2 lbs from the previous day's weight.). Kelly Gibson  Active   HUMALOG  KWIKPEN 100 UNIT/ML KwikPen [id number]  INJECT 3 TO 5 UNITS INTO THE SKIN DAILY BEFORE LUNCH Kelly Gibson  Active Spouse/Significant Other, Pharmacy Records  hydrALAZINE  (APRESOLINE ) 100 MG tablet [id number]  1 tablet up to every 12 hours only as needed for blood pressure >150/90 Kelly Gibson  Active Spouse/Significant Other, Pharmacy Records  insulin  degludec (TRESIBA  FLEXTOUCH) 100 UNIT/ML FlexTouch Pen [id number]  Inject 10 Units into the skin 2 (two) times daily. Adjust by 2 units every 3 days as needed for blood sugars higher than 150 in the morning fasting. Kelly Gibson  Active Spouse/Significant Other, Pharmacy Records           Med Note Kelly Gibson   Thu Dec 23, 2023 11:16 AM)    Melatonin 5 MG CHEW [id number]  Chew 10 mg by mouth at bedtime. Provider, Historical, Kelly Gibson  Active Spouse/Significant Other, Pharmacy Records  Multiple Vitamins-Minerals (MULTIVITAMIN GUMMIES WOMENS) CICERO [id number]  Chew 2 each by mouth in the morning. Provider, Historical, Kelly Gibson  Active Spouse/Significant Other, Pharmacy Records  olmesartan (BENICAR) 40 MG tablet [id number]  Take 40 mg by mouth in the morning. Provider, Historical, Kelly Gibson  Active Spouse/Significant Other, Pharmacy Records  prochlorperazine  (COMPAZINE ) 10 MG tablet [id number]  Take 1 tablet (10 mg total) by mouth every 6 (six) hours as needed. Kelly Gibson  Active   rosuvastatin  (CRESTOR ) 40 MG tablet [id number]  TAKE 1 TABLET BY MOUTH DAILY Kelly Gibson  Active  Spouse/Significant Other, Pharmacy Records  UNABLE TO FIND [id number]  Take 1 tablet by mouth with breakfast, with lunch, and with evening meal. GlucoGold -Berberine, Concentrated Cinnamon, Chromium, Banaba Leaf Extract Provider, Historical, Kelly Gibson  Active Spouse/Significant Other, Pharmacy Records  Med List Note Kelly Gibson, CPhT 12/23/23 1134): Husband manages medications               Assessment/Plan:   Diabetes: - Currently uncontrolled but improving - Reviewed long term cardiovascular and renal outcomes of uncontrolled blood sugar - Reviewed goal A1c, goal fasting, and goal 2 hour post prandial glucose - Recommend to continue current medication therapy. Consider insulin  adjustment or addition of SGLT2 if A1C still above 7 at next check in October - Meets financial criteria for Tresiba /Novolog  and Breztri  assistance. Prepared applications and patient will come by to sign tomorrow in office.   Follow Up Plan: keep patient updated on status of applications  Jon VEAR Lindau, PharmD Clinical Pharmacist [phone]

## 2024-02-25 ENCOUNTER — Ambulatory Visit
Admission: RE | Admit: 2024-02-25 | Discharge: 2024-02-25 | Disposition: A | Discharge status: Home / Self Care | Source: Ambulatory Visit | Attending: Radiation Oncology | Admitting: Radiation Oncology

## 2024-02-25 ENCOUNTER — Other Ambulatory Visit: Payer: Self-pay

## 2024-02-25 DIAGNOSIS — C3432 Malignant neoplasm of lower lobe, left bronchus or lung: Secondary | ICD-10-CM | POA: Insufficient documentation

## 2024-02-25 DIAGNOSIS — Z87891 Personal history of nicotine dependence: Secondary | ICD-10-CM | POA: Diagnosis not present

## 2024-02-28 ENCOUNTER — Other Ambulatory Visit: Payer: Self-pay | Admitting: Medical Oncology

## 2024-02-28 ENCOUNTER — Encounter: Payer: Self-pay | Admitting: Internal Medicine

## 2024-02-28 ENCOUNTER — Other Ambulatory Visit: Payer: Self-pay

## 2024-02-28 ENCOUNTER — Ambulatory Visit
Admission: RE | Admit: 2024-02-28 | Discharge: 2024-02-28 | Disposition: A | Discharge status: Home / Self Care | Source: Ambulatory Visit | Attending: Radiation Oncology | Admitting: Radiation Oncology

## 2024-02-28 ENCOUNTER — Inpatient Hospital Stay

## 2024-02-28 VITALS — BP 140/83 | HR 50 | Temp 98.5°F | Resp 18 | Wt 190.8 lb

## 2024-02-28 DIAGNOSIS — Z79633 Long term (current) use of mitotic inhibitor: Secondary | ICD-10-CM | POA: Insufficient documentation

## 2024-02-28 DIAGNOSIS — R590 Localized enlarged lymph nodes: Secondary | ICD-10-CM | POA: Insufficient documentation

## 2024-02-28 DIAGNOSIS — C3432 Malignant neoplasm of lower lobe, left bronchus or lung: Secondary | ICD-10-CM | POA: Diagnosis not present

## 2024-02-28 DIAGNOSIS — Z87891 Personal history of nicotine dependence: Secondary | ICD-10-CM | POA: Diagnosis not present

## 2024-02-28 DIAGNOSIS — Z5111 Encounter for antineoplastic chemotherapy: Secondary | ICD-10-CM | POA: Insufficient documentation

## 2024-02-28 DIAGNOSIS — Z510 Encounter for antineoplastic radiation therapy: Secondary | ICD-10-CM | POA: Diagnosis not present

## 2024-02-28 DIAGNOSIS — I878 Other specified disorders of veins: Secondary | ICD-10-CM

## 2024-02-28 LAB — CMP (CANCER CENTER ONLY)
ALT: 9 U/L (ref 0–44)
AST: 16 U/L (ref 15–41)
Albumin: 4 g/dL (ref 3.5–5.0)
Alkaline Phosphatase: 78 U/L (ref 38–126)
Anion gap: 8 (ref 5–15)
BUN: 18 mg/dL (ref 8–23)
CO2: 28 mmol/L (ref 22–32)
Calcium: 9.6 mg/dL (ref 8.9–10.3)
Chloride: 104 mmol/L (ref 98–111)
Creatinine: 1.59 mg/dL — ABNORMAL HIGH (ref 0.44–1.00)
GFR, Estimated: 37 mL/min — ABNORMAL LOW (ref >60)
Glucose, Bld: 74 mg/dL (ref 70–99)
Potassium: 3.8 mmol/L (ref 3.5–5.1)
Sodium: 140 mmol/L (ref 135–145)
Total Bilirubin: 0.4 mg/dL (ref 0.0–1.2)
Total Protein: 8.1 g/dL (ref 6.5–8.1)

## 2024-02-28 LAB — RAD ONC ARIA SESSION SUMMARY
Course Elapsed Days: 0
Plan Fractions Treated to Date: 1
Plan Prescribed Dose Per Fraction: 2 Gy
Plan Total Fractions Prescribed: 30
Plan Total Prescribed Dose: 60 Gy
Reference Point Dosage Given to Date: 2 Gy
Reference Point Session Dosage Given: 2 Gy
Session Number: 1

## 2024-02-28 LAB — CBC WITH DIFFERENTIAL (CANCER CENTER ONLY)
Abs Immature Granulocytes: 0.02 K/uL (ref 0.00–0.07)
Basophils Absolute: 0.1 K/uL (ref 0.0–0.1)
Basophils Relative: 1 %
Eosinophils Absolute: 0.3 K/uL (ref 0.0–0.5)
Eosinophils Relative: 4 %
HCT: 37.8 % (ref 36.0–46.0)
Hemoglobin: 12.1 g/dL (ref 12.0–15.0)
Immature Granulocytes: 0 %
Lymphocytes Relative: 24 %
Lymphs Abs: 1.7 K/uL (ref 0.7–4.0)
MCH: 26.2 pg (ref 26.0–34.0)
MCHC: 32 g/dL (ref 30.0–36.0)
MCV: 82 fL (ref 80.0–100.0)
Monocytes Absolute: 0.8 K/uL (ref 0.1–1.0)
Monocytes Relative: 11 %
Neutro Abs: 4.3 K/uL (ref 1.7–7.7)
Neutrophils Relative %: 60 %
Platelet Count: 381 K/uL (ref 150–400)
RBC: 4.61 MIL/uL (ref 3.87–5.11)
RDW: 16.2 % — ABNORMAL HIGH (ref 11.5–15.5)
WBC Count: 7.1 K/uL (ref 4.0–10.5)
nRBC: 0 % (ref 0.0–0.2)

## 2024-02-28 LAB — MISCELLANEOUS TEST

## 2024-02-28 MED ORDER — DEXAMETHASONE SODIUM PHOSPHATE 10 MG/ML IJ SOLN
10.0000 mg | Freq: Once | INTRAMUSCULAR | Status: AC
  Administered 2024-02-28: 10 mg via INTRAVENOUS
  Filled 2024-02-28: qty 1

## 2024-02-28 MED ORDER — SODIUM CHLORIDE 0.9 % IV SOLN: INTRAVENOUS | Status: DC

## 2024-02-28 MED ORDER — SODIUM CHLORIDE 0.9 % IV SOLN
146.6000 mg | Freq: Once | INTRAVENOUS | Status: AC
  Administered 2024-02-28: 150 mg via INTRAVENOUS
  Filled 2024-02-28: qty 15

## 2024-02-28 MED ORDER — FAMOTIDINE IN NACL 20-0.9 MG/50ML-% IV SOLN
20.0000 mg | Freq: Once | INTRAVENOUS | Status: AC
  Administered 2024-02-28: 20 mg via INTRAVENOUS
  Filled 2024-02-28: qty 50

## 2024-02-28 MED ORDER — SODIUM CHLORIDE 0.9 % IV SOLN
45.0000 mg/m2 | Freq: Once | INTRAVENOUS | Status: AC
  Administered 2024-02-28: 90 mg via INTRAVENOUS
  Filled 2024-02-28: qty 15

## 2024-02-28 MED ORDER — DIPHENHYDRAMINE HCL 50 MG/ML IJ SOLN
50.0000 mg | Freq: Once | INTRAMUSCULAR | Status: AC
  Administered 2024-02-28: 50 mg via INTRAVENOUS
  Filled 2024-02-28: qty 1

## 2024-02-28 MED ORDER — PALONOSETRON HCL INJECTION 0.25 MG/5ML
0.2500 mg | Freq: Once | INTRAVENOUS | Status: AC
  Administered 2024-02-28: 0.25 mg via INTRAVENOUS
  Filled 2024-02-28: qty 5

## 2024-02-28 NOTE — Progress Notes (Signed)
 Per Dr. Sherrod, okay to treat with Scr 1.59

## 2024-02-28 NOTE — Patient Instructions (Signed)
 [facility] - A DEPT OF [facility]  Discharge Instructions: Thank you for choosing [facility] to provide your oncology and hematology care.   If you have a lab appointment with the Cancer Center, please go directly to the Cancer Center and check in at the registration area.   Wear comfortable clothing and clothing appropriate for easy access to any Portacath or PICC line.   We strive to give you quality time with your provider. You may need to reschedule your appointment if you arrive late (15 or more minutes).  Arriving late affects you and other patients whose appointments are after yours.  Also, if you miss three or more appointments without notifying the office, you may be dismissed from the clinic at the provider's discretion.      For prescription refill requests, have your pharmacy contact our office and allow 72 hours for refills to be completed.    Today you received the following chemotherapy and/or immunotherapy agents: Carboplatin , paclitaxel      To help prevent nausea and vomiting after your treatment, we encourage you to take your nausea medication as directed.  BELOW ARE SYMPTOMS THAT SHOULD BE REPORTED IMMEDIATELY: *FEVER GREATER THAN 100.4 F (38 C) OR HIGHER *CHILLS OR SWEATING *NAUSEA AND VOMITING THAT IS NOT CONTROLLED WITH YOUR NAUSEA MEDICATION *UNUSUAL SHORTNESS OF BREATH *UNUSUAL BRUISING OR BLEEDING *URINARY PROBLEMS (pain or burning when urinating, or frequent urination) *BOWEL PROBLEMS (unusual diarrhea, constipation, pain near the anus) TENDERNESS IN MOUTH AND THROAT WITH OR WITHOUT PRESENCE OF ULCERS (sore throat, sores in mouth, or a toothache) UNUSUAL RASH, SWELLING OR PAIN  UNUSUAL VAGINAL DISCHARGE OR ITCHING   Items with * indicate a potential emergency and should be followed up as soon as possible or go to the Emergency Department if any problems should occur.  Please show the CHEMOTHERAPY ALERT CARD or  IMMUNOTHERAPY ALERT CARD at check-in to the Emergency Department and triage nurse.  Should you have questions after your visit or need to cancel or reschedule your appointment, please contact [facility]  Dept: [phone]  and follow the prompts.  Office hours are 8:00 a.m. to 4:30 p.m. Monday - Friday. Please note that voicemails left after 4:00 p.m. may not be returned until the following business day.  We are closed weekends and major holidays. You have access to a nurse at all times for urgent questions. Please call the main number to the clinic Dept: [phone] and follow the prompts.   For any non-urgent questions, you may also contact your provider using MyChart. We now offer e-Visits for anyone 41 and older to request care online for non-urgent symptoms. For details visit [email or web address].   Also download the MyChart app! Go to the app store, search MyChart, open the app, select [place], and log in with your MyChart username and password.

## 2024-02-28 NOTE — Progress Notes (Signed)
 Patient is requesting a port for chemotherapy treatments.  Made Diane Bell, RN aware.

## 2024-02-29 ENCOUNTER — Other Ambulatory Visit: Payer: Self-pay

## 2024-02-29 ENCOUNTER — Ambulatory Visit
Admission: RE | Admit: 2024-02-29 | Discharge: 2024-02-29 | Disposition: A | Discharge status: Home / Self Care | Source: Ambulatory Visit | Attending: Radiation Oncology

## 2024-02-29 DIAGNOSIS — C3432 Malignant neoplasm of lower lobe, left bronchus or lung: Secondary | ICD-10-CM | POA: Diagnosis not present

## 2024-02-29 DIAGNOSIS — Z87891 Personal history of nicotine dependence: Secondary | ICD-10-CM | POA: Diagnosis not present

## 2024-02-29 DIAGNOSIS — Z510 Encounter for antineoplastic radiation therapy: Secondary | ICD-10-CM | POA: Diagnosis not present

## 2024-02-29 LAB — RAD ONC ARIA SESSION SUMMARY
Course Elapsed Days: 1
Plan Fractions Treated to Date: 2
Plan Prescribed Dose Per Fraction: 2 Gy
Plan Total Fractions Prescribed: 30
Plan Total Prescribed Dose: 60 Gy
Reference Point Dosage Given to Date: 4 Gy
Reference Point Session Dosage Given: 2 Gy
Session Number: 2

## 2024-03-01 ENCOUNTER — Other Ambulatory Visit: Payer: Self-pay

## 2024-03-01 ENCOUNTER — Encounter (HOSPITAL_COMMUNITY): Payer: Self-pay | Admitting: Internal Medicine

## 2024-03-01 ENCOUNTER — Ambulatory Visit
Admission: RE | Admit: 2024-03-01 | Discharge: 2024-03-01 | Disposition: A | Discharge status: Home / Self Care | Source: Ambulatory Visit | Attending: Radiation Oncology

## 2024-03-01 DIAGNOSIS — Z510 Encounter for antineoplastic radiation therapy: Secondary | ICD-10-CM | POA: Diagnosis not present

## 2024-03-01 DIAGNOSIS — C3432 Malignant neoplasm of lower lobe, left bronchus or lung: Secondary | ICD-10-CM | POA: Diagnosis not present

## 2024-03-01 DIAGNOSIS — Z87891 Personal history of nicotine dependence: Secondary | ICD-10-CM | POA: Diagnosis not present

## 2024-03-01 LAB — RAD ONC ARIA SESSION SUMMARY
Course Elapsed Days: 2
Plan Fractions Treated to Date: 3
Plan Prescribed Dose Per Fraction: 2 Gy
Plan Total Fractions Prescribed: 30
Plan Total Prescribed Dose: 60 Gy
Reference Point Dosage Given to Date: 6 Gy
Reference Point Session Dosage Given: 2 Gy
Session Number: 3

## 2024-03-02 ENCOUNTER — Other Ambulatory Visit: Payer: Self-pay

## 2024-03-02 ENCOUNTER — Telehealth: Payer: Self-pay

## 2024-03-02 ENCOUNTER — Ambulatory Visit
Admission: RE | Admit: 2024-03-02 | Discharge: 2024-03-02 | Disposition: A | Discharge status: Home / Self Care | Source: Ambulatory Visit | Attending: Radiation Oncology

## 2024-03-02 DIAGNOSIS — Z87891 Personal history of nicotine dependence: Secondary | ICD-10-CM | POA: Diagnosis not present

## 2024-03-02 DIAGNOSIS — Z510 Encounter for antineoplastic radiation therapy: Secondary | ICD-10-CM | POA: Diagnosis not present

## 2024-03-02 DIAGNOSIS — C3432 Malignant neoplasm of lower lobe, left bronchus or lung: Secondary | ICD-10-CM | POA: Diagnosis not present

## 2024-03-02 LAB — RAD ONC ARIA SESSION SUMMARY
Course Elapsed Days: 3
Plan Fractions Treated to Date: 4
Plan Prescribed Dose Per Fraction: 2 Gy
Plan Total Fractions Prescribed: 30
Plan Total Prescribed Dose: 60 Gy
Reference Point Dosage Given to Date: 8 Gy
Reference Point Session Dosage Given: 2 Gy
Session Number: 4

## 2024-03-02 NOTE — Telephone Encounter (Signed)
 Kelly Gibson states that she is doing fine today. She has 3 small bout of diarrhea last evening. None today. Suggested that she drink some Gatorade type beverage that she likes for 32 oz of  her fluids. She verbalized understanding She is eating and urinating well. She knows to call the office at [phone] if  she has any questions or concerns.

## 2024-03-02 NOTE — Telephone Encounter (Signed)
-----   Message from Nurse Ritta ORN sent at 03/01/2024 10:51 AM EDT ----- Regarding: MOHAMED 1ST TIME TAXOL DENNICE Patient received 1st time Taxol /Carbo on 8/4.  Tolerated well.  No s/s or c/o distress or discomfort.

## 2024-03-03 ENCOUNTER — Ambulatory Visit
Admission: RE | Admit: 2024-03-03 | Discharge: 2024-03-03 | Disposition: A | Discharge status: Home / Self Care | Source: Ambulatory Visit | Attending: Radiation Oncology | Admitting: Radiation Oncology

## 2024-03-03 ENCOUNTER — Other Ambulatory Visit: Payer: Self-pay

## 2024-03-03 DIAGNOSIS — Z510 Encounter for antineoplastic radiation therapy: Secondary | ICD-10-CM | POA: Diagnosis not present

## 2024-03-03 DIAGNOSIS — C3432 Malignant neoplasm of lower lobe, left bronchus or lung: Secondary | ICD-10-CM

## 2024-03-03 DIAGNOSIS — Z87891 Personal history of nicotine dependence: Secondary | ICD-10-CM | POA: Diagnosis not present

## 2024-03-03 LAB — RAD ONC ARIA SESSION SUMMARY
Course Elapsed Days: 4
Plan Fractions Treated to Date: 5
Plan Prescribed Dose Per Fraction: 2 Gy
Plan Total Fractions Prescribed: 30
Plan Total Prescribed Dose: 60 Gy
Reference Point Dosage Given to Date: 10 Gy
Reference Point Session Dosage Given: 2 Gy
Session Number: 5

## 2024-03-03 MED ORDER — SONAFINE EX EMUL
1.0000 | Freq: Two times a day (BID) | CUTANEOUS | Status: DC
  Administered 2024-03-03: 1 via TOPICAL

## 2024-03-06 ENCOUNTER — Encounter (HOSPITAL_COMMUNITY): Payer: Self-pay | Admitting: Internal Medicine

## 2024-03-06 ENCOUNTER — Ambulatory Visit
Admission: RE | Admit: 2024-03-06 | Discharge: 2024-03-06 | Disposition: A | Discharge status: Home / Self Care | Source: Ambulatory Visit | Attending: Radiation Oncology

## 2024-03-06 ENCOUNTER — Other Ambulatory Visit: Payer: Self-pay

## 2024-03-06 DIAGNOSIS — Z87891 Personal history of nicotine dependence: Secondary | ICD-10-CM | POA: Diagnosis not present

## 2024-03-06 DIAGNOSIS — C3432 Malignant neoplasm of lower lobe, left bronchus or lung: Secondary | ICD-10-CM | POA: Diagnosis not present

## 2024-03-06 DIAGNOSIS — Z510 Encounter for antineoplastic radiation therapy: Secondary | ICD-10-CM | POA: Diagnosis not present

## 2024-03-06 LAB — RAD ONC ARIA SESSION SUMMARY
Course Elapsed Days: 7
Plan Fractions Treated to Date: 6
Plan Prescribed Dose Per Fraction: 2 Gy
Plan Total Fractions Prescribed: 30
Plan Total Prescribed Dose: 60 Gy
Reference Point Dosage Given to Date: 12 Gy
Reference Point Session Dosage Given: 2 Gy
Session Number: 6

## 2024-03-07 ENCOUNTER — Inpatient Hospital Stay

## 2024-03-07 ENCOUNTER — Ambulatory Visit
Admission: RE | Admit: 2024-03-07 | Discharge: 2024-03-07 | Disposition: A | Discharge status: Home / Self Care | Source: Ambulatory Visit | Attending: Radiation Oncology | Admitting: Radiation Oncology

## 2024-03-07 ENCOUNTER — Other Ambulatory Visit: Payer: Self-pay

## 2024-03-07 ENCOUNTER — Inpatient Hospital Stay: Admitting: Physician Assistant

## 2024-03-07 VITALS — BP 121/67 | HR 58 | Temp 97.1°F | Resp 13 | Wt 189.7 lb

## 2024-03-07 DIAGNOSIS — R110 Nausea: Secondary | ICD-10-CM

## 2024-03-07 DIAGNOSIS — R42 Dizziness and giddiness: Secondary | ICD-10-CM

## 2024-03-07 DIAGNOSIS — C3432 Malignant neoplasm of lower lobe, left bronchus or lung: Secondary | ICD-10-CM

## 2024-03-07 DIAGNOSIS — Z87891 Personal history of nicotine dependence: Secondary | ICD-10-CM | POA: Diagnosis not present

## 2024-03-07 DIAGNOSIS — Z5111 Encounter for antineoplastic chemotherapy: Secondary | ICD-10-CM

## 2024-03-07 DIAGNOSIS — Z510 Encounter for antineoplastic radiation therapy: Secondary | ICD-10-CM | POA: Diagnosis not present

## 2024-03-07 LAB — RAD ONC ARIA SESSION SUMMARY
Course Elapsed Days: 8
Plan Fractions Treated to Date: 7
Plan Prescribed Dose Per Fraction: 2 Gy
Plan Total Fractions Prescribed: 30
Plan Total Prescribed Dose: 60 Gy
Reference Point Dosage Given to Date: 14 Gy
Reference Point Session Dosage Given: 2 Gy
Session Number: 7

## 2024-03-07 LAB — CBC WITH DIFFERENTIAL (CANCER CENTER ONLY)
Abs Immature Granulocytes: 0.04 K/uL (ref 0.00–0.07)
Basophils Absolute: 0 K/uL (ref 0.0–0.1)
Basophils Relative: 1 %
Eosinophils Absolute: 0.2 K/uL (ref 0.0–0.5)
Eosinophils Relative: 4 %
HCT: 36.7 % (ref 36.0–46.0)
Hemoglobin: 11.5 g/dL — ABNORMAL LOW (ref 12.0–15.0)
Immature Granulocytes: 1 %
Lymphocytes Relative: 8 %
Lymphs Abs: 0.4 K/uL — ABNORMAL LOW (ref 0.7–4.0)
MCH: 26 pg (ref 26.0–34.0)
MCHC: 31.3 g/dL (ref 30.0–36.0)
MCV: 82.8 fL (ref 80.0–100.0)
Monocytes Absolute: 0.4 K/uL (ref 0.1–1.0)
Monocytes Relative: 8 %
Neutro Abs: 3.8 K/uL (ref 1.7–7.7)
Neutrophils Relative %: 78 %
Platelet Count: 351 K/uL (ref 150–400)
RBC: 4.43 MIL/uL (ref 3.87–5.11)
RDW: 16.1 % — ABNORMAL HIGH (ref 11.5–15.5)
WBC Count: 4.8 K/uL (ref 4.0–10.5)
nRBC: 0 % (ref 0.0–0.2)

## 2024-03-07 LAB — CMP (CANCER CENTER ONLY)
ALT: 9 U/L (ref 0–44)
AST: 14 U/L — ABNORMAL LOW (ref 15–41)
Albumin: 3.8 g/dL (ref 3.5–5.0)
Alkaline Phosphatase: 67 U/L (ref 38–126)
Anion gap: 8 (ref 5–15)
BUN: 21 mg/dL (ref 8–23)
CO2: 29 mmol/L (ref 22–32)
Calcium: 9.5 mg/dL (ref 8.9–10.3)
Chloride: 105 mmol/L (ref 98–111)
Creatinine: 1.33 mg/dL — ABNORMAL HIGH (ref 0.44–1.00)
GFR, Estimated: 46 mL/min — ABNORMAL LOW (ref >60)
Glucose, Bld: 166 mg/dL — ABNORMAL HIGH (ref 70–99)
Potassium: 4.6 mmol/L (ref 3.5–5.1)
Sodium: 142 mmol/L (ref 135–145)
Total Bilirubin: 0.5 mg/dL (ref 0.0–1.2)
Total Protein: 7.3 g/dL (ref 6.5–8.1)

## 2024-03-07 MED ORDER — SODIUM CHLORIDE 0.9 % IV SOLN
45.0000 mg/m2 | Freq: Once | INTRAVENOUS | Status: AC
  Administered 2024-03-07 (×2): 90 mg via INTRAVENOUS
  Filled 2024-03-07: qty 15

## 2024-03-07 MED ORDER — SODIUM CHLORIDE 0.9 % IV SOLN: INTRAVENOUS | Status: AC

## 2024-03-07 MED ORDER — DIPHENHYDRAMINE HCL 50 MG/ML IJ SOLN
50.0000 mg | Freq: Once | INTRAMUSCULAR | Status: AC
  Administered 2024-03-07 (×2): 50 mg via INTRAVENOUS
  Filled 2024-03-07: qty 1

## 2024-03-07 MED ORDER — DEXAMETHASONE SODIUM PHOSPHATE 10 MG/ML IJ SOLN
10.0000 mg | Freq: Once | INTRAMUSCULAR | Status: AC
  Administered 2024-03-07 (×2): 10 mg via INTRAVENOUS
  Filled 2024-03-07: qty 1

## 2024-03-07 MED ORDER — PALONOSETRON HCL INJECTION 0.25 MG/5ML
0.2500 mg | Freq: Once | INTRAVENOUS | Status: AC
  Administered 2024-03-07 (×2): 0.25 mg via INTRAVENOUS
  Filled 2024-03-07: qty 5

## 2024-03-07 MED ORDER — FAMOTIDINE IN NACL 20-0.9 MG/50ML-% IV SOLN
20.0000 mg | Freq: Once | INTRAVENOUS | Status: AC
  Administered 2024-03-07 (×2): 20 mg via INTRAVENOUS
  Filled 2024-03-07: qty 50

## 2024-03-07 MED ORDER — SODIUM CHLORIDE 0.9 % IV SOLN: INTRAVENOUS | Status: DC

## 2024-03-07 MED ORDER — SODIUM CHLORIDE 0.9 % IV SOLN
146.6000 mg | Freq: Once | INTRAVENOUS | Status: AC
  Administered 2024-03-07 (×2): 150 mg via INTRAVENOUS
  Filled 2024-03-07: qty 15

## 2024-03-07 NOTE — Patient Instructions (Signed)
 [facility] - A DEPT OF [facility]  Discharge Instructions: Thank you for choosing [facility] to provide your oncology and hematology care.   If you have a lab appointment with the Cancer Center, please go directly to the Cancer Center and check in at the registration area.   Wear comfortable clothing and clothing appropriate for easy access to any Portacath or PICC line.   We strive to give you quality time with your provider. You may need to reschedule your appointment if you arrive late (15 or more minutes).  Arriving late affects you and other patients whose appointments are after yours.  Also, if you miss three or more appointments without notifying the office, you may be dismissed from the clinic at the provider's discretion.      For prescription refill requests, have your pharmacy contact our office and allow 72 hours for refills to be completed.    Today you received the following chemotherapy and/or immunotherapy agents: CARBOplatin  (PARAPLATIN ) , PACLitaxel  (TAXOL )     To help prevent nausea and vomiting after your treatment, we encourage you to take your nausea medication as directed.  BELOW ARE SYMPTOMS THAT SHOULD BE REPORTED IMMEDIATELY: *FEVER GREATER THAN 100.4 F (38 C) OR HIGHER *CHILLS OR SWEATING *NAUSEA AND VOMITING THAT IS NOT CONTROLLED WITH YOUR NAUSEA MEDICATION *UNUSUAL SHORTNESS OF BREATH *UNUSUAL BRUISING OR BLEEDING *URINARY PROBLEMS (pain or burning when urinating, or frequent urination) *BOWEL PROBLEMS (unusual diarrhea, constipation, pain near the anus) TENDERNESS IN MOUTH AND THROAT WITH OR WITHOUT PRESENCE OF ULCERS (sore throat, sores in mouth, or a toothache) UNUSUAL RASH, SWELLING OR PAIN  UNUSUAL VAGINAL DISCHARGE OR ITCHING   Items with * indicate a potential emergency and should be followed up as soon as possible or go to the Emergency Department if any problems should occur.  Please show the  CHEMOTHERAPY ALERT CARD or IMMUNOTHERAPY ALERT CARD at check-in to the Emergency Department and triage nurse.  Should you have questions after your visit or need to cancel or reschedule your appointment, please contact [facility]  Dept: [phone]  and follow the prompts.  Office hours are 8:00 a.m. to 4:30 p.m. Monday - Friday. Please note that voicemails left after 4:00 p.m. may not be returned until the following business day.  We are closed weekends and major holidays. You have access to a nurse at all times for urgent questions. Please call the main number to the clinic Dept: [phone] and follow the prompts.   For any non-urgent questions, you may also contact your provider using MyChart. We now offer e-Visits for anyone 77 and older to request care online for non-urgent symptoms. For details visit [email or web address].   Also download the MyChart app! Go to the app store, search MyChart, open the app, select [name], and log in with your MyChart username and password.

## 2024-03-07 NOTE — Progress Notes (Signed)
 [facility] [phone]   [phone]  PROGRESS NOTE  Patient Care Team: Early, [name], NP as PCP - General (Nurse Practitioner) Mona Vinie BROCKS, MD as PCP - Cardiology (Cardiology) Pcp, No Pa, [facility] Lionell Jon DEL, [place] (Pharmacist) Tanda Elenor BRAVO, RN as Registered Nurse  DIAGNOSIS: Stage III (T3, N3, M0) NSCLC, adenocarcinoma. She presented with left lower lobe mass and intermittent activity with nonenlarged mediastinal lymph nodes.  She was diagnosed in June 2025.   Molecular Studies: Will be sent off today 02/15/24   PRIOR THERAPY: None   CURRENT THERAPY: Concurrent chemoradiation with weekly carboplatin  for an AUC of 2 and paclitaxel  45 mg/m.  First dose on 02/28/2024.  INTERVAL HISTORY:  Kelly Gibson 63 y.o. female returns for a toxicity check prior to cycle 2, day 1 of carbo Taxol  given concurrently with radiation.   On exam today, Kelly Gibson reports her energy levels are fairly stable since starting treatment.  She does experience fatigue which requires her to rest throughout the day.  Her appetite has been stable without any noted weight loss.  She experienced nausea starting the day of treatment and lasted several days.  Her nausea improved after taking Compazine  as needed.  She denies any vomiting episodes.  She reports having intermittent episodes of diarrhea but adds that she was consuming prunes which could have contributed to this.  She denies easy bruising or signs of active bleeding.  She does have history of chronic mild hematuria that is secondary to kidney stones.  She reports having some dizzy spells after treatment that lasted several days.  She said she checked her blood pressure which was not abnormal.  She adds that she staying hydrated throughout the day.  She reports having increased dyspnea on exertion. Patient denies fevers, chills, night sweats, chest pain or cough.  She has no other complaints.   Rest of the 10 point ROS as below.  MEDICAL HISTORY:  Past Medical History:  Diagnosis Date   Bradycardia    SR/SB with up to 3.5 second pause (asymptomatic) on ~ 10/2017 event monitor; saw EP Dr. Kelsie: avoid AV nodal blocking agents   Chronic kidney disease    COPD (chronic obstructive pulmonary disease) (HCC)    Depression    Depression with anxiety 08/29/2021   Diabetes mellitus    Dysarthria, post-stroke    Dysrhythmia    Ganglion cyst 03/09/2012   Heart block AV complete (HCC) 12/11/2023   History of kidney stones    Hyperlipidemia    Hypertension    Hypokalemia    Myocardial infarction [facility])    NSTEMI   Non-ST elevation (NSTEMI) myocardial infarction (HCC) 12/11/2023   Obesity, unspecified 12/17/2011   Pneumonia    Pneumonia of left lower lobe due to infectious organism 12/12/2023   PONV (postoperative nausea and vomiting)    Skin lesion of face 11/17/2022   Stroke (HCC)    2019   right side hemiparesis    SURGICAL HISTORY: Past Surgical History:  Procedure Laterality Date   CESAREAN SECTION     x 3   LEFT HEART CATH AND CORONARY ANGIOGRAPHY N/A 12/28/2023   Procedure: LEFT HEART CATH AND CORONARY ANGIOGRAPHY;  Surgeon: Elmira Newman PARAS, MD;  Location: [facility] INVASIVE CV LAB;  Service: Cardiovascular;  Laterality: N/A;   ORIF WRIST FRACTURE Right 05/04/2018   Procedure: OPEN REDUCTION INTERNAL FIXATION (ORIF) RIGHT  WRIST FRACTURE;  Surgeon: Cristy Bonner DASEN, MD;  Location: [facility] OR;  Service: Orthopedics;  Laterality: Right;   TUMOR REMOVAL     left shoulder   VIDEO BRONCHOSCOPY WITH ENDOBRONCHIAL NAVIGATION  01/03/2024   Procedure: VIDEO BRONCHOSCOPY WITH ENDOBRONCHIAL NAVIGATION;  Surgeon: Shelah Lamar RAMAN, MD;  Location: [facility] ENDOSCOPY;  Service: Pulmonary;;   VIDEO BRONCHOSCOPY WITH ENDOBRONCHIAL ULTRASOUND Bilateral 01/03/2024   Procedure: BRONCHOSCOPY, WITH EBUS;  Surgeon: Shelah Lamar RAMAN, MD;  Location: [facility] ENDOSCOPY;  Service: Pulmonary;  Laterality: Bilateral;     SOCIAL HISTORY: Social History   Socioeconomic History   Marital status: Married    Spouse name: Not on file   Number of children: Not on file   Years of education: Not on file   Highest education level: Some college, no degree  Occupational History   Not on file  Tobacco Use   Smoking status: Former    Current packs/day: 1.00    Average packs/day: 1 pack/day for 20.0 years (20.0 ttl pk-yrs)    Types: Cigarettes   Smokeless tobacco: Never   Tobacco comments:    stopped 2018  Vaping Use   Vaping status: Never Used  Substance and Sexual Activity   Alcohol use: No   Drug use: No   Sexual activity: Not Currently  Other Topics Concern   Not on file  Social History Narrative   Not on file   Social Drivers of Health   Financial Resource Strain: Medium Risk (01/07/2024)   Overall Financial Resource Strain (CARDIA)    Difficulty of Paying Living Expenses: Somewhat hard  Food Insecurity: No Food Insecurity (01/17/2024)   Hunger Vital Sign    Worried About Running Out of Food in the Last Year: Never true    Ran Out of Food in the Last Year: Never true  Transportation Needs: No Transportation Needs (01/17/2024)   PRAPARE - Administrator, Civil Service (Medical): No    Lack of Transportation (Non-Medical): No  Physical Activity: Inactive (01/07/2024)   Exercise Vital Sign    Days of Exercise per Week: 0 days    Minutes of Exercise per Session: Not on file  Stress: No Stress Concern Present (01/07/2024)   [employer] of Occupational Health - Occupational Stress Questionnaire    Feeling of Stress: Only a little  Social Connections: Unknown (01/17/2024)   Social Connection and Isolation Panel    Frequency of Communication with Friends and Family: Three times a week    Frequency of Social Gatherings with Friends and Family: More than three times a week    Attends Religious Services: Never    Database administrator or Organizations: Patient unable to answer     Attends Banker Meetings: Patient unable to answer    Marital Status: Married  Catering manager Violence: Patient Unable To Answer (01/17/2024)   Humiliation, Afraid, Rape, and Kick questionnaire    Fear of Current or Ex-Partner: Patient unable to answer    Emotionally Abused: Patient unable to answer    Physically Abused: Patient unable to answer    Sexually Abused: Patient unable to answer    FAMILY HISTORY: Family History  Problem Relation Age of Onset   Diabetes Mother    Stroke Mother    Heart disease Father    Cancer Father    Stroke Father     ALLERGIES:  is allergic to latex and niacin.  MEDICATIONS:  Current Outpatient Medications  Medication Sig Dispense Refill   albuterol  (PROVENTIL ) (2.5 MG/3ML) 0.083% nebulizer solution Inhale 3 mLs (2.5 mg total) into  the lungs every 6 (six) hours as needed for wheezing or shortness of breath. 75 mL 12   albuterol  (VENTOLIN  HFA) 108 (90 Base) MCG/ACT inhaler Inhale 2 puffs into the lungs every 6 (six) hours as needed for wheezing or shortness of breath. 18 g 2   amLODipine  (NORVASC ) 5 MG tablet Take 5 mg by mouth daily.     apixaban  (ELIQUIS ) 5 MG TABS tablet Take 1 tablet (5 mg total) by mouth 2 (two) times daily. 60 tablet 3   aspirin  EC 81 MG tablet Take 1 tablet (81 mg total) by mouth daily. Swallow whole. 30 tablet 12   budesonide -glycopyrrolate -formoterol  (BREZTRI  AEROSPHERE) 160-9-4.8 MCG/ACT AERO inhaler Inhale 2 puffs into the lungs 2 (two) times daily. 10.7 g 1   cefadroxil  (DURICEF) 500 MG capsule Take 1 capsule (500 mg total) by mouth daily. 5 capsule 0   citalopram  (CELEXA ) 10 MG tablet TAKE 1 TABLET BY MOUTH DAILY 90 tablet 1   Continuous Glucose Sensor (DEXCOM G7 SENSOR) MISC Take 1 Device by mouth See admin instructions. Change sensor every 10 days     Doxylamine  Succinate, Sleep, (UNISOM  PO) Take 2 each by mouth at bedtime. Gel capsules     furosemide  (LASIX ) 20 MG tablet Take 1 tablet (20 mg total) by  mouth daily as needed for fluid or edema (take if daily weight is greater than 2 lbs from the previous day's weight.). 90 tablet 0   HUMALOG  KWIKPEN 100 UNIT/ML KwikPen INJECT 3 TO 5 UNITS INTO THE SKIN DAILY BEFORE LUNCH 6 mL 3   hydrALAZINE  (APRESOLINE ) 100 MG tablet 1 tablet up to every 12 hours only as needed for blood pressure >150/90     insulin  degludec (TRESIBA  FLEXTOUCH) 100 UNIT/ML FlexTouch Pen Inject 10 Units into the skin 2 (two) times daily. Adjust by 2 units every 3 days as needed for blood sugars higher than 150 in the morning fasting. 9 mL 11   Melatonin 5 MG CHEW Chew 10 mg by mouth at bedtime.     Multiple Vitamins-Minerals (MULTIVITAMIN GUMMIES WOMENS) CHEW Chew 2 each by mouth in the morning.     olmesartan (BENICAR) 40 MG tablet Take 40 mg by mouth in the morning.     prochlorperazine  (COMPAZINE ) 10 MG tablet Take 1 tablet (10 mg total) by mouth every 6 (six) hours as needed. 30 tablet 2   rosuvastatin  (CRESTOR ) 40 MG tablet TAKE 1 TABLET BY MOUTH DAILY 90 tablet 3   UNABLE TO FIND Take 1 tablet by mouth with breakfast, with lunch, and with evening meal. GlucoGold -Berberine, Concentrated Cinnamon, Chromium, Banaba Leaf Extract     No current facility-administered medications for this visit.   Facility-Administered Medications Ordered in Other Visits  Medication Dose Route Frequency Provider Last Rate Last Admin   0.9 %  sodium chloride  infusion   Intravenous Continuous Sherrod Sherrod, MD   Stopped at 03/07/24 1528    REVIEW OF SYSTEMS:   Constitutional: ( - ) fevers, ( - )  chills , ( - ) night sweats Eyes: ( - ) blurriness of vision, ( - ) double vision, ( - ) watery eyes Ears, nose, mouth, throat, and face: ( - ) mucositis, ( - ) sore throat Respiratory: ( - ) cough, ( +) dyspnea, ( - ) wheezes Cardiovascular: ( - ) palpitation, ( - ) chest discomfort, ( - ) lower extremity swelling Gastrointestinal:  (+ ) nausea, ( - ) heartburn, ( + ) change in bowel habits Skin:  ( - )  abnormal skin rashes Lymphatics: ( - ) new lymphadenopathy, ( - ) easy bruising Neurological: ( - ) numbness, ( - ) tingling, ( - ) new weaknesses Behavioral/Psych: ( - ) mood change, ( - ) new changes  All other systems were reviewed with the patient and are negative.  PHYSICAL EXAMINATION: ECOG PERFORMANCE STATUS: 1 - Symptomatic but completely ambulatory  Vitals:   03/07/24 1051  BP: 121/67  Pulse: (!) 58  Resp: 13  Temp: (!) 97.1 F (36.2 C)  SpO2: 96%   Filed Weights   03/07/24 1051  Weight: 189 lb 11.2 oz (86 kg)    GENERAL: well appearing female in NAD  SKIN: skin color, texture, turgor are normal, no rashes or significant lesions EYES: conjunctiva are pink and non-injected, sclera clear  LUNGS: clear to auscultation and percussion with normal breathing effort HEART: regular rate & rhythm and no murmurs and no lower extremity edema Musculoskeletal: no cyanosis of digits and no clubbing  PSYCH: alert & oriented x 3, fluent speech NEURO: no focal motor/sensory deficits  LABORATORY DATA:  I have reviewed the data as listed    Latest Ref Rng & Units 03/07/2024   10:28 AM 02/28/2024   12:22 PM 02/15/2024    1:05 PM  CBC  WBC 4.0 - 10.5 K/uL 4.8  7.1  6.5   Hemoglobin 12.0 - 15.0 g/dL 88.4  87.8  87.7   Hematocrit 36.0 - 46.0 % 36.7  37.8  38.0   Platelets 150 - 400 K/uL 351  381  334        Latest Ref Rng & Units 03/07/2024   10:28 AM 02/28/2024   12:22 PM 02/15/2024    1:05 PM  CMP  Glucose 70 - 99 mg/dL 833  74  829   BUN 8 - 23 mg/dL 21  18  19    Creatinine 0.44 - 1.00 mg/dL 8.66  8.40  8.32   Sodium 135 - 145 mmol/L 142  140  141   Potassium 3.5 - 5.1 mmol/L 4.6  3.8  4.1   Chloride 98 - 111 mmol/L 105  104  104   CO2 22 - 32 mmol/L 29  28  30    Calcium  8.9 - 10.3 mg/dL 9.5  9.6  9.6   Total Protein 6.5 - 8.1 g/dL 7.3  8.1  7.6   Total Bilirubin 0.0 - 1.2 mg/dL 0.5  0.4  0.4   Alkaline Phos 38 - 126 U/L 67  78  70   AST 15 - 41 U/L 14  16  14    ALT  0 - 44 U/L 9  9  8      RADIOGRAPHIC STUDIES: I have personally reviewed the radiological images as listed and agreed with the findings in the report. NM PET Image Initial (PI) Skull Base To Thigh (F-18 FDG) Result Date: 02/12/2024 CLINICAL DATA:  Initial treatment strategy for non-small cell lung cancer. EXAM: NUCLEAR MEDICINE PET SKULL BASE TO THIGH TECHNIQUE: 9.48 mCi F-18 FDG was injected intravenously. Full-ring PET imaging was performed from the skull base to thigh after the radiotracer. CT data was obtained and used for attenuation correction and anatomic localization. Fasting blood glucose: 80 mg/dl COMPARISON:  Chest CTA 01/12/2024. Chest CT 12/31/2023. Abdominopelvic CT 12/12/2023. FINDINGS: Mediastinal blood pool activity: SUV max 2.3 NECK: No hypermetabolic cervical lymph nodes are identified. No suspicious activity identified within the pharyngeal mucosal space. Incidental CT findings: Bilateral carotid atherosclerosis. CHEST: The previously demonstrated left lower lobe mass demonstrates  moderate hypermetabolic activity with an SUV max of 11.8. This lesion measures approximately 5.9 x 4.2 cm on image 46/7. No other hypermetabolic pulmonary activity or suspicious nodularity. Intermediate activity within nonenlarged mediastinal lymph nodes, including a right paratracheal node (SUV max 5.3), in AP window node (SUV max 6.2), and a subcarinal node (SUV max 6.1). No axillary adenopathy. Incidental CT findings: Atherosclerosis of the aorta, great vessels and coronary arteries. Resolution of previously demonstrated left pleural effusion. There is improved aeration of both lung bases. ABDOMEN/PELVIS: There is no hypermetabolic activity within the liver, adrenal glands, spleen or pancreas. There is no hypermetabolic nodal activity in the abdomen or pelvis. Incidental CT findings: Small gallstone. Left renal staghorn calculus without hydronephrosis. Aortic and branch vessel atherosclerosis. SKELETON: There is  no hypermetabolic activity to strongly suggest osseous metastatic disease. There is a small focus of hypermetabolic activity near the left 2nd costovertebral junction (SUV max 5.9), without clear corresponding abnormality on the CT images. Incidental CT findings: Mild spondylosis. IMPRESSION: 1. The previously demonstrated left lower lobe mass demonstrates moderate hypermetabolic activity, consistent with primary bronchogenic carcinoma. 2. Intermediate activity within nonenlarged mediastinal lymph nodes, indeterminate for metastatic disease. 3. No definite evidence of metastatic disease in the abdomen or pelvis. 4. No definite osseous metastatic disease. Small focus of hypermetabolic activity near the left 2nd costovertebral junction without clear corresponding abnormality on the CT images, favored to be degenerative. 5. Resolution of previously demonstrated left pleural effusion. 6.  Aortic Atherosclerosis (ICD10-I70.0). Electronically Signed   By: Elsie Perone M.D.   On: 02/12/2024 17:41   MR BRAIN W WO CONTRAST Result Date: 02/11/2024 CLINICAL DATA:  Provided history: Malignant neoplasm of unspecified part of unspecified bronchus or lung. Non-small cell lung cancer, staging. EXAM: MRI HEAD WITHOUT AND WITH CONTRAST TECHNIQUE: Multiplanar, multiecho pulse sequences of the brain and surrounding structures were obtained without and with intravenous contrast. CONTRAST:  9mL GADAVIST  GADOBUTROL  1 MMOL/ML IV SOLN COMPARISON:  Head CT 12/07/2017.  Brain MRI 12/05/2017. FINDINGS: Brain: Generalized cerebral atrophy. As before, there are fairly numerous chronic lacunar infarcts within the bilateral cerebral hemispheric white matter and within/about the bilateral deep gray nuclei. A chronic lacunar infarct within the anterior left frontal lobe periventricular white matter is new from the prior MRI of 12/05/2017. Chronic ischemic changes also affect the body of the corpus callosum, unchanged. Background multifocal T2  FLAIR hyperintense signal abnormality within the cerebral white matter, nonspecific but compatible chronic small vessel ischemic disease. Chronic lacunar infarcts within the pons, with background pontine chronic small vessel ischemic disease, not significantly changed. Several nonspecific chronic hemorrhages scattered within the supratentorial and infratentorial brain. There is no acute infarct. No evidence of an intracranial mass. No extra-axial fluid collection. No midline shift. No pathologic intracranial enhancement identified. Vascular: Maintained flow voids within the proximal large arterial vessels. Skull and upper cervical spine: No focal worrisome marrow lesion. Sinuses/Orbits: No mass or acute finding within the imaged orbits. No significant paranasal sinus disease. IMPRESSION: 1. No evidence of intracranial metastatic disease. 2. Parenchymal atrophy with advanced chronic small vessel ischemic disease and multiple chronic infarcts, as described. Electronically Signed   By: Rockey Childs D.O.   On: 02/11/2024 08:32    ASSESSMENT & PLAN SITLALY GUDIEL is a 63 y.o. female who presents to the clinic for continued management for stage III non-small cell lung cancer, adenocarcinoma.  #Stage III (T3, N3, M0) non small cell lung cancer, adenocarinoma  --Diagnosed in June 2025 after presented with large left  lower lobe lung mass with hilar and mediastinal lymphadenopathy.  --Recommended concurrent chemoradiation with carboplatin  AUC of 2 and 45 Mg/M2  PLAN: --Due for cycle 2, day 1 of carbo/taxol  today --Labs from today were reviewed and adequate for treatment. WBC 4.8, Hgb 11.5, MCV 82.8, Plt 351, creatinine stable at 1.33, LFTS in range.  --Proceed with treatment without any dose modifications --Continue with weekly chemotherapy and RTC in 2 weeks for toxicity check  #Dizziness: --Encouraged hydration and will arrange of 1 L of NS fluids today  #Nausea: --Continue to take compazine  q 6 hours  as needed.  No orders of the defined types were placed in this encounter.   All questions were answered. The patient knows to call the clinic with any problems, questions or concerns.  I have spent a total of 30 minutes minutes of face-to-face and non-face-to-face time, preparing to see the patient, performing a medically appropriate examination, counseling and educating the patient,documenting clinical information in the electronic health record, independently interpreting results and communicating results to the patient, and care coordination.   Johnston Police, PA-C Department of Hematology/Oncology [facility] Phone: [phone]

## 2024-03-08 ENCOUNTER — Ambulatory Visit
Admission: RE | Admit: 2024-03-08 | Discharge: 2024-03-08 | Disposition: A | Discharge status: Home / Self Care | Source: Ambulatory Visit | Attending: Radiation Oncology | Admitting: Radiation Oncology

## 2024-03-08 ENCOUNTER — Other Ambulatory Visit: Payer: Self-pay

## 2024-03-08 DIAGNOSIS — Z510 Encounter for antineoplastic radiation therapy: Secondary | ICD-10-CM | POA: Diagnosis not present

## 2024-03-08 DIAGNOSIS — Z87891 Personal history of nicotine dependence: Secondary | ICD-10-CM | POA: Diagnosis not present

## 2024-03-08 DIAGNOSIS — C3432 Malignant neoplasm of lower lobe, left bronchus or lung: Secondary | ICD-10-CM | POA: Diagnosis not present

## 2024-03-08 LAB — RAD ONC ARIA SESSION SUMMARY
Course Elapsed Days: 9
Plan Fractions Treated to Date: 8
Plan Prescribed Dose Per Fraction: 2 Gy
Plan Total Fractions Prescribed: 30
Plan Total Prescribed Dose: 60 Gy
Reference Point Dosage Given to Date: 16 Gy
Reference Point Session Dosage Given: 2 Gy
Session Number: 8

## 2024-03-09 ENCOUNTER — Other Ambulatory Visit: Payer: Self-pay

## 2024-03-09 ENCOUNTER — Ambulatory Visit (HOSPITAL_COMMUNITY)
Admission: RE | Admit: 2024-03-09 | Discharge: 2024-03-09 | Disposition: A | Discharge status: Home / Self Care | Source: Ambulatory Visit | Attending: Internal Medicine | Admitting: Internal Medicine

## 2024-03-09 ENCOUNTER — Ambulatory Visit
Admission: RE | Admit: 2024-03-09 | Discharge: 2024-03-09 | Disposition: A | Discharge status: Home / Self Care | Source: Ambulatory Visit | Attending: Radiation Oncology | Admitting: Radiation Oncology

## 2024-03-09 ENCOUNTER — Ambulatory Visit (HOSPITAL_COMMUNITY)
Admission: RE | Admit: 2024-03-09 | Discharge: 2024-03-09 | Disposition: A | Discharge status: Home / Self Care | Source: Ambulatory Visit | Attending: Nurse Practitioner | Admitting: Nurse Practitioner

## 2024-03-09 ENCOUNTER — Encounter (HOSPITAL_COMMUNITY): Payer: Self-pay

## 2024-03-09 DIAGNOSIS — Z87891 Personal history of nicotine dependence: Secondary | ICD-10-CM | POA: Diagnosis not present

## 2024-03-09 DIAGNOSIS — I878 Other specified disorders of veins: Secondary | ICD-10-CM | POA: Insufficient documentation

## 2024-03-09 DIAGNOSIS — Z5986 Financial insecurity: Secondary | ICD-10-CM | POA: Insufficient documentation

## 2024-03-09 DIAGNOSIS — C3432 Malignant neoplasm of lower lobe, left bronchus or lung: Secondary | ICD-10-CM | POA: Diagnosis not present

## 2024-03-09 DIAGNOSIS — Z510 Encounter for antineoplastic radiation therapy: Secondary | ICD-10-CM | POA: Diagnosis not present

## 2024-03-09 HISTORY — PX: IR IMAGING GUIDED PORT INSERTION: IMG5740

## 2024-03-09 LAB — RAD ONC ARIA SESSION SUMMARY
Course Elapsed Days: 10
Plan Fractions Treated to Date: 9
Plan Prescribed Dose Per Fraction: 2 Gy
Plan Total Fractions Prescribed: 30
Plan Total Prescribed Dose: 60 Gy
Reference Point Dosage Given to Date: 18 Gy
Reference Point Session Dosage Given: 2 Gy
Session Number: 9

## 2024-03-09 LAB — GLUCOSE, CAPILLARY: Glucose-Capillary: 110 mg/dL — ABNORMAL HIGH (ref 70–99)

## 2024-03-09 MED ORDER — MIDAZOLAM HCL 2 MG/2ML IJ SOLN
INTRAMUSCULAR | Status: AC
  Filled 2024-03-09: qty 2

## 2024-03-09 MED ORDER — SODIUM CHLORIDE 0.9 % IV SOLN: INTRAVENOUS | Status: DC

## 2024-03-09 MED ORDER — MIDAZOLAM HCL 2 MG/2ML IJ SOLN
INTRAMUSCULAR | Status: AC | PRN
  Administered 2024-03-09: 1 mg via INTRAVENOUS
  Administered 2024-03-09: .5 mg via INTRAVENOUS

## 2024-03-09 MED ORDER — FENTANYL CITRATE (PF) 100 MCG/2ML IJ SOLN
INTRAMUSCULAR | Status: AC
  Filled 2024-03-09: qty 2

## 2024-03-09 MED ORDER — LIDOCAINE HCL 1 % IJ SOLN
INTRAMUSCULAR | Status: AC
  Filled 2024-03-09: qty 20

## 2024-03-09 MED ORDER — LIDOCAINE HCL 1 % IJ SOLN
20.0000 mL | Freq: Once | INTRAMUSCULAR | Status: AC
  Administered 2024-03-09: 20 mL via INTRADERMAL

## 2024-03-09 MED ORDER — HEPARIN SOD (PORK) LOCK FLUSH 100 UNIT/ML IV SOLN
INTRAVENOUS | Status: AC
  Filled 2024-03-09: qty 5

## 2024-03-09 MED ORDER — FENTANYL CITRATE (PF) 100 MCG/2ML IJ SOLN
INTRAMUSCULAR | Status: AC | PRN
  Administered 2024-03-09: 50 ug via INTRAVENOUS

## 2024-03-09 MED ORDER — HEPARIN SOD (PORK) LOCK FLUSH 100 UNIT/ML IV SOLN
500.0000 [IU] | Freq: Once | INTRAVENOUS | Status: AC
  Administered 2024-03-09: 500 [IU] via INTRAVENOUS

## 2024-03-09 NOTE — Progress Notes (Signed)
 1515 Ice bag given to use prn for comfort to right upper neck and right upper chest as instructed.

## 2024-03-09 NOTE — Procedures (Signed)
 Interventional Radiology Procedure Note  Procedure: Single Lumen Power Port Placement    Access:  Right IJ vein.  Findings: Catheter tip positioned at SVC/RA junction. Port is ready for immediate use.   Complications: None  EBL: < 10 mL  Recommendations:  - Ok to shower in 24 hours - Do not submerge for 7 days - Routine line care   Maxi Carreras T. Fredia Sorrow, M.D Pager:  [phone]

## 2024-03-09 NOTE — Discharge Instructions (Addendum)
 Discharge Instructions:   Please call Interventional Radiology clinic [phone] with any questions or concerns.  You may remove your dressings and shower tomorrow.  Do not use EMLA  / Lidocaine  cream for 2 weeks post Port Insertion this will remove the surgical glue.   Moderate Conscious Sedation, Adult, Care After  This sheet gives you information about how to care for yourself after your procedure. Your health care provider may also give you more specific instructions. If you have problems or questions, contact your health care provider. What can I expect after the procedure? After the procedure, it is common to have: Sleepiness for several hours. Impaired judgment for several hours. Difficulty with balance. Vomiting if you eat too soon. Follow these instructions at home: For the time period you were told by your health care provider: Rest. Do not participate in activities where you could fall or become injured. Do not drive or use machinery. Do not drink alcohol. Do not take sleeping pills or medicines that cause drowsiness. Do not make important decisions or sign legal documents. Do not take care of children on your own. Eating and drinking  Follow the diet recommended by your health care provider. Drink enough fluid to keep your urine pale yellow. If you vomit: Drink water, juice, or soup when you can drink without vomiting. Make sure you have little or no nausea before eating solid foods. General instructions Take over-the-counter and prescription medicines only as told by your health care provider. Have a responsible adult stay with you for the time you are told. It is important to have someone help care for you until you are awake and alert. Do not smoke. Keep all follow-up visits as told by your health care provider. This is important. Contact a health care provider if: You are still sleepy or having trouble with balance after 24 hours. You feel light-headed. You  keep feeling nauseous or you keep vomiting. You develop a rash. You have a fever. You have redness or swelling around the IV site. Get help right away if: You have trouble breathing. You have new-onset confusion at home. Summary After the procedure, it is common to feel sleepy, have impaired judgment, or feel nauseous if you eat too soon. Rest after you get home. Know the things you should not do after the procedure. Follow the diet recommended by your health care provider and drink enough fluid to keep your urine pale yellow. Get help right away if you have trouble breathing or new-onset confusion at home. This information is not intended to replace advice given to you by your health care provider. Make sure you discuss any questions you have with your health care provider. Document Revised: 11/10/2019 Document Reviewed: 06/08/2019 Elsevier Patient Education  2023 Elsevier Inc.    Implanted Isabel Insertion, Care After  The following information offers guidance on how to care for yourself after your procedure. Your health care provider may also give you more specific instructions. If you have problems or questions, contact your health care provider. What can I expect after the procedure? After the procedure, it is common to have: Discomfort at the port insertion site. Bruising on the skin over the port. This should improve over 3-4 days. Follow these instructions at home: [facility] care After your port is placed, you will get a manufacturer's information card. The card has information about your port. Keep this card with you at all times. Take care of the port as told by your health care provider. Ask your health  care provider if you or a family member can get training for taking care of the port at home. A home health care nurse will be be available to help care for the port. Make sure to remember what type of port you have. Incision care     Follow instructions from your health care  provider about how to take care of your port insertion site. Make sure you: Wash your hands with soap and water for at least 20 seconds before and after you change your bandage (dressing). If soap and water are not available, use hand sanitizer. Change your dressing as told by your health care provider. Leave stitches (sutures), skin glue, or adhesive strips in place. These skin closures may need to stay in place for 2 weeks or longer. If adhesive strip edges start to loosen and curl up, you may trim the loose edges. Do not remove adhesive strips completely unless your health care provider tells you to do that. Check your port insertion site every day for signs of infection. Check for: Redness, swelling, or pain. Fluid or blood. Warmth. Pus or a bad smell. Activity Return to your normal activities as told by your health care provider. Ask your health care provider what activities are safe for you. You may have to avoid lifting. Ask your health care provider how much you can safely lift. General instructions Take over-the-counter and prescription medicines only as told by your health care provider. Do not take baths, swim, or use a hot tub until your health care provider approves. Ask your health care provider if you may take showers. You may only be allowed to take sponge baths. If you were given a sedative during the procedure, it can affect you for several hours. Do not drive or operate machinery until your health care provider says that it is safe. Wear a medical alert bracelet in case of an emergency. This will tell any health care providers that you have a port. Keep all follow-up visits. This is important. Contact a health care provider if: You cannot flush your port with saline as directed, or you cannot draw blood from the port. You have a fever or chills. You have redness, swelling, or pain around your port insertion site. You have fluid or blood coming from your port insertion  site. Your port insertion site feels warm to the touch. You have pus or a bad smell coming from the port insertion site. Get help right away if: You have chest pain or shortness of breath. You have bleeding from your port that you cannot control. These symptoms may be an emergency. Get help right away. Call 911. Do not wait to see if the symptoms will go away. Do not drive yourself to the hospital. Summary Take care of the port as told by your health care provider. Keep the manufacturer's information card with you at all times. Change your dressing as told by your health care provider. Contact a health care provider if you have a fever or chills or if you have redness, swelling, or pain around your port insertion site. Keep all follow-up visits. This information is not intended to replace advice given to you by your health care provider. Make sure you discuss any questions you have with your health care provider. Document Revised: 01/14/2021 Document Reviewed: 01/14/2021 Elsevier Patient Education  2023 [employer].

## 2024-03-09 NOTE — Consult Note (Signed)
 Chief Complaint: Patient was seen in consultation today for lung cancer  Referring Physician(s): Mohamed,Mohamed  Supervising Physician: Luverne Aran  Patient Status: [facility] - Out-pt  History of Present Illness: Kelly Gibson is a 63 y.o. female with history of CAD (3 vessel disease but CABG deferred per pt), AV block, COPD, HTN, HLD, DM2, CVA with residual hemiparesis and dysphagia, CKD3 with large L renal staghorn calculi now with new diagnosis of LLL lung adenocarcinoma. She is in need of durable venous access for initiation of chemotherapy.  She presents to [facility] Radiology today for Port-A-Cath placement at the request of Dr. Sherrod.   Patient presents for procedure today in her usual state of health. She is NPO.  She has arranged care and transportation post-procedure.  She is aware of the goals of hte procedure and is agreeable to proceed.    Past Medical History:  Diagnosis Date   Bradycardia    SR/SB with up to 3.5 second pause (asymptomatic) on ~ 10/2017 event monitor; saw EP Dr. Kelsie: avoid AV nodal blocking agents   Chronic kidney disease    COPD (chronic obstructive pulmonary disease) (HCC)    Depression    Depression with anxiety 08/29/2021   Diabetes mellitus    Dysarthria, post-stroke    Dysrhythmia    Ganglion cyst 03/09/2012   Heart block AV complete (HCC) 12/11/2023   History of kidney stones    Hyperlipidemia    Hypertension    Hypokalemia    Myocardial infarction [facility])    NSTEMI   Non-ST elevation (NSTEMI) myocardial infarction (HCC) 12/11/2023   Obesity, unspecified 12/17/2011   Pneumonia    Pneumonia of left lower lobe due to infectious organism 12/12/2023   PONV (postoperative nausea and vomiting)    Skin lesion of face 11/17/2022   Stroke [facility])    2019   right side hemiparesis    Past Surgical History:  Procedure Laterality Date   CESAREAN SECTION     x 3   LEFT HEART CATH AND CORONARY ANGIOGRAPHY N/A 12/28/2023   Procedure: LEFT  HEART CATH AND CORONARY ANGIOGRAPHY;  Surgeon: Elmira Newman PARAS, MD;  Location: [facility] INVASIVE CV LAB;  Service: Cardiovascular;  Laterality: N/A;   ORIF WRIST FRACTURE Right 05/04/2018   Procedure: OPEN REDUCTION INTERNAL FIXATION (ORIF) RIGHT  WRIST FRACTURE;  Surgeon: Cristy Bonner DASEN, MD;  Location: [facility] OR;  Service: Orthopedics;  Laterality: Right;   TUMOR REMOVAL     left shoulder   VIDEO BRONCHOSCOPY WITH ENDOBRONCHIAL NAVIGATION  01/03/2024   Procedure: VIDEO BRONCHOSCOPY WITH ENDOBRONCHIAL NAVIGATION;  Surgeon: Shelah Lamar RAMAN, MD;  Location: [facility] ENDOSCOPY;  Service: Pulmonary;;   VIDEO BRONCHOSCOPY WITH ENDOBRONCHIAL ULTRASOUND Bilateral 01/03/2024   Procedure: BRONCHOSCOPY, WITH EBUS;  Surgeon: Shelah Lamar RAMAN, MD;  Location: [facility] ENDOSCOPY;  Service: Pulmonary;  Laterality: Bilateral;    Allergies: Latex and Niacin  Medications: Prior to Admission medications   Medication Sig Start Date End Date Taking? Authorizing Provider  amLODipine  (NORVASC ) 5 MG tablet Take 5 mg by mouth daily. 02/07/24  Yes [provider]  apixaban  (ELIQUIS ) 5 MG TABS tablet Take 1 tablet (5 mg total) by mouth 2 (two) times daily. 02/14/24  Yes Sherrod Sherrod, MD  aspirin  EC 81 MG tablet Take 1 tablet (81 mg total) by mouth daily. Swallow whole. 12/15/23  Yes Perri DELENA Meliton Mickey., MD  citalopram  (CELEXA ) 10 MG tablet TAKE 1 TABLET BY MOUTH DAILY 01/06/24  Yes Early, Sara E, NP  Continuous Glucose Sensor (  DEXCOM G7 SENSOR) MISC Take 1 Device by mouth See admin instructions. Change sensor every 10 days   Yes [provider]  Doxylamine  Succinate, Sleep, (UNISOM  PO) Take 2 each by mouth at bedtime. Gel capsules   Yes [provider]  furosemide  (LASIX ) 20 MG tablet Take 1 tablet (20 mg total) by mouth daily as needed for fluid or edema (take if daily weight is greater than 2 lbs from the previous day's weight.). 01/14/24 04/13/24 Yes Laurence Locus, DO  HUMALOG  KWIKPEN 100 UNIT/ML KwikPen INJECT 3 TO  5 UNITS INTO THE SKIN DAILY BEFORE LUNCH 11/29/23  Yes Early, Sara E, NP  hydrALAZINE  (APRESOLINE ) 100 MG tablet 1 tablet up to every 12 hours only as needed for blood pressure >150/90 01/10/24  Yes Early, [name], NP  insulin  degludec (TRESIBA  FLEXTOUCH) 100 UNIT/ML FlexTouch Pen Inject 10 Units into the skin 2 (two) times daily. Adjust by 2 units every 3 days as needed for blood sugars higher than 150 in the morning fasting. 10/05/22  Yes Early, Sara E, NP  Melatonin 5 MG CHEW Chew 10 mg by mouth at bedtime.   Yes [provider]  Multiple Vitamins-Minerals (MULTIVITAMIN GUMMIES WOMENS) CHEW Chew 2 each by mouth in the morning.   Yes [provider]  olmesartan (BENICAR) 40 MG tablet Take 40 mg by mouth in the morning. 05/12/23  Yes [provider]  prochlorperazine  (COMPAZINE ) 10 MG tablet Take 1 tablet (10 mg total) by mouth every 6 (six) hours as needed. 02/15/24  Yes Heilingoetter, Cassandra L, PA-C  rosuvastatin  (CRESTOR ) 40 MG tablet TAKE 1 TABLET BY MOUTH DAILY 04/08/23  Yes Early, Sara E, NP  UNABLE TO FIND Take 1 tablet by mouth with breakfast, with lunch, and with evening meal. GlucoGold -Berberine, Concentrated Cinnamon, Chromium, Banaba Leaf Extract   Yes [provider]  albuterol  (PROVENTIL ) (2.5 MG/3ML) 0.083% nebulizer solution Inhale 3 mLs (2.5 mg total) into the lungs every 6 (six) hours as needed for wheezing or shortness of breath. 01/04/24   Darci Pore, MD  albuterol  (VENTOLIN  HFA) 108 (90 Base) MCG/ACT inhaler Inhale 2 puffs into the lungs every 6 (six) hours as needed for wheezing or shortness of breath. 12/14/23   Perri DELENA Meliton Mickey., MD  budesonide -glycopyrrolate -formoterol  (BREZTRI  AEROSPHERE) 160-9-4.8 MCG/ACT AERO inhaler Inhale 2 puffs into the lungs 2 (two) times daily. 12/14/23   Perri DELENA Meliton Mickey., MD  cefadroxil  (DURICEF) 500 MG capsule Take 1 capsule (500 mg total) by mouth daily. 02/01/24   Early, [name], NP     Family  History  Problem Relation Age of Onset   Diabetes Mother    Stroke Mother    Heart disease Father    Cancer Father    Stroke Father     Social History   Socioeconomic History   Marital status: Married    Spouse name: Not on file   Number of children: Not on file   Years of education: Not on file   Highest education level: Some college, no degree  Occupational History   Not on file  Tobacco Use   Smoking status: Former    Current packs/day: 1.00    Average packs/day: 1 pack/day for 20.0 years (20.0 ttl pk-yrs)    Types: Cigarettes   Smokeless tobacco: Never   Tobacco comments:    stopped 2018  Vaping Use   Vaping status: Never Used  Substance and Sexual Activity   Alcohol use: No   Drug use: No  Sexual activity: Not Currently  Other Topics Concern   Not on file  Social History Narrative   Not on file   Social Drivers of Health   Financial Resource Strain: Medium Risk (01/07/2024)   Overall Financial Resource Strain (CARDIA)    Difficulty of Paying Living Expenses: Somewhat hard  Food Insecurity: No Food Insecurity (01/17/2024)   Hunger Vital Sign    Worried About Running Out of Food in the Last Year: Never true    Ran Out of Food in the Last Year: Never true  Transportation Needs: No Transportation Needs (01/17/2024)   PRAPARE - Administrator, Civil Service (Medical): No    Lack of Transportation (Non-Medical): No  Physical Activity: Inactive (01/07/2024)   Exercise Vital Sign    Days of Exercise per Week: 0 days    Minutes of Exercise per Session: Not on file  Stress: No Stress Concern Present (01/07/2024)   [employer] of Occupational Health - Occupational Stress Questionnaire    Feeling of Stress: Only a little  Social Connections: Unknown (01/17/2024)   Social Connection and Isolation Panel    Frequency of Communication with Friends and Family: Three times a week    Frequency of Social Gatherings with Friends and Family: More than three  times a week    Attends Religious Services: Never    Database administrator or Organizations: Patient unable to answer    Attends Banker Meetings: Patient unable to answer    Marital Status: Married     Review of Systems: A 12 point ROS discussed and pertinent positives are indicated in the HPI above.  All other systems are negative.  Review of Systems  Constitutional:  Negative for fatigue and fever.  Respiratory:  Negative for cough and shortness of breath.   Cardiovascular:  Negative for chest pain.  Gastrointestinal:  Negative for abdominal pain, nausea and vomiting.  Musculoskeletal:  Negative for back pain.  Psychiatric/Behavioral:  Negative for behavioral problems and confusion.     Vital Signs: BP (!) 152/64   Pulse (!) 50   Temp 98.2 F (36.8 C) (Oral)   Resp 15   Ht 5' 3 (1.6 m)   Wt 189 lb 9.5 oz (86 kg)   LMP 12/10/2012 (Exact Date)   SpO2 97%   BMI 33.59 kg/m   Physical Exam Vitals and nursing note reviewed.  Constitutional:      General: She is not in acute distress.    Appearance: Normal appearance. She is not ill-appearing.  HENT:     Mouth/Throat:     Mouth: Mucous membranes are moist.     Pharynx: Oropharynx is clear.  Cardiovascular:     Rate and Rhythm: Normal rate and regular rhythm.  Pulmonary:     Breath sounds: Normal breath sounds.     Comments: Clear to auscultation in the upper chest, stable on O2. Some increased work of breath-- at baseline.  Abdominal:     General: Abdomen is flat. There is no distension.     Palpations: Abdomen is soft.  Skin:    General: Skin is warm and dry.  Neurological:     General: No focal deficit present.     Mental Status: She is alert and oriented to person, place, and time. Mental status is at baseline.  Psychiatric:        Mood and Affect: Mood normal.        Behavior: Behavior normal.  Thought Content: Thought content normal.        Judgment: Judgment normal.      MD  Evaluation Airway: WNL Heart: WNL Abdomen: WNL Chest/ Lungs: WNL ASA  Classification: 3 Mallampati/Airway Score: Two   Imaging: NM PET Image Initial (PI) Skull Base To Thigh (F-18 FDG) Result Date: 02/12/2024 CLINICAL DATA:  Initial treatment strategy for non-small cell lung cancer. EXAM: NUCLEAR MEDICINE PET SKULL BASE TO THIGH TECHNIQUE: 9.48 mCi F-18 FDG was injected intravenously. Full-ring PET imaging was performed from the skull base to thigh after the radiotracer. CT data was obtained and used for attenuation correction and anatomic localization. Fasting blood glucose: 80 mg/dl COMPARISON:  Chest CTA 01/12/2024. Chest CT 12/31/2023. Abdominopelvic CT 12/12/2023. FINDINGS: Mediastinal blood pool activity: SUV max 2.3 NECK: No hypermetabolic cervical lymph nodes are identified. No suspicious activity identified within the pharyngeal mucosal space. Incidental CT findings: Bilateral carotid atherosclerosis. CHEST: The previously demonstrated left lower lobe mass demonstrates moderate hypermetabolic activity with an SUV max of 11.8. This lesion measures approximately 5.9 x 4.2 cm on image 46/7. No other hypermetabolic pulmonary activity or suspicious nodularity. Intermediate activity within nonenlarged mediastinal lymph nodes, including a right paratracheal node (SUV max 5.3), in AP window node (SUV max 6.2), and a subcarinal node (SUV max 6.1). No axillary adenopathy. Incidental CT findings: Atherosclerosis of the aorta, great vessels and coronary arteries. Resolution of previously demonstrated left pleural effusion. There is improved aeration of both lung bases. ABDOMEN/PELVIS: There is no hypermetabolic activity within the liver, adrenal glands, spleen or pancreas. There is no hypermetabolic nodal activity in the abdomen or pelvis. Incidental CT findings: Small gallstone. Left renal staghorn calculus without hydronephrosis. Aortic and branch vessel atherosclerosis. SKELETON: There is no  hypermetabolic activity to strongly suggest osseous metastatic disease. There is a small focus of hypermetabolic activity near the left 2nd costovertebral junction (SUV max 5.9), without clear corresponding abnormality on the CT images. Incidental CT findings: Mild spondylosis. IMPRESSION: 1. The previously demonstrated left lower lobe mass demonstrates moderate hypermetabolic activity, consistent with primary bronchogenic carcinoma. 2. Intermediate activity within nonenlarged mediastinal lymph nodes, indeterminate for metastatic disease. 3. No definite evidence of metastatic disease in the abdomen or pelvis. 4. No definite osseous metastatic disease. Small focus of hypermetabolic activity near the left 2nd costovertebral junction without clear corresponding abnormality on the CT images, favored to be degenerative. 5. Resolution of previously demonstrated left pleural effusion. 6.  Aortic Atherosclerosis (ICD10-I70.0). Electronically Signed   By: Elsie Perone M.D.   On: 02/12/2024 17:41   MR BRAIN W WO CONTRAST Result Date: 02/11/2024 CLINICAL DATA:  Provided history: Malignant neoplasm of unspecified part of unspecified bronchus or lung. Non-small cell lung cancer, staging. EXAM: MRI HEAD WITHOUT AND WITH CONTRAST TECHNIQUE: Multiplanar, multiecho pulse sequences of the brain and surrounding structures were obtained without and with intravenous contrast. CONTRAST:  9mL GADAVIST  GADOBUTROL  1 MMOL/ML IV SOLN COMPARISON:  Head CT 12/07/2017.  Brain MRI 12/05/2017. FINDINGS: Brain: Generalized cerebral atrophy. As before, there are fairly numerous chronic lacunar infarcts within the bilateral cerebral hemispheric white matter and within/about the bilateral deep gray nuclei. A chronic lacunar infarct within the anterior left frontal lobe periventricular white matter is new from the prior MRI of 12/05/2017. Chronic ischemic changes also affect the body of the corpus callosum, unchanged. Background multifocal T2  FLAIR hyperintense signal abnormality within the cerebral white matter, nonspecific but compatible chronic small vessel ischemic disease. Chronic lacunar infarcts within the pons, with background pontine chronic small  vessel ischemic disease, not significantly changed. Several nonspecific chronic hemorrhages scattered within the supratentorial and infratentorial brain. There is no acute infarct. No evidence of an intracranial mass. No extra-axial fluid collection. No midline shift. No pathologic intracranial enhancement identified. Vascular: Maintained flow voids within the proximal large arterial vessels. Skull and upper cervical spine: No focal worrisome marrow lesion. Sinuses/Orbits: No mass or acute finding within the imaged orbits. No significant paranasal sinus disease. IMPRESSION: 1. No evidence of intracranial metastatic disease. 2. Parenchymal atrophy with advanced chronic small vessel ischemic disease and multiple chronic infarcts, as described. Electronically Signed   By: Rockey Childs D.O.   On: 02/11/2024 08:32    Labs:  CBC: Recent Labs    01/27/24 1401 02/15/24 1305 02/28/24 1222 03/07/24 1028  WBC 5.8 6.5 7.1 4.8  HGB 12.2 12.2 12.1 11.5*  HCT 38.1 38.0 37.8 36.7  PLT 322 334 381 351    COAGS: Recent Labs    12/13/23 0613 01/13/24 0736  INR 1.0  --   APTT  --  80*    BMP: Recent Labs    01/27/24 1401 02/15/24 1305 02/28/24 1222 03/07/24 1028  NA 141 141 140 142  K 4.0 4.1 3.8 4.6  CL 105 104 104 105  CO2 27 30 28 29   GLUCOSE 146* 170* 74 166*  BUN 20 19 18 21   CALCIUM  9.3 9.6 9.6 9.5  CREATININE 1.56* 1.67* 1.59* 1.33*  GFRNONAA 38* 35* 37* 46*    LIVER FUNCTION TESTS: Recent Labs    01/27/24 1401 02/15/24 1305 02/28/24 1222 03/07/24 1028  BILITOT 0.4 0.4 0.4 0.5  AST 15 14* 16 14*  ALT 11 8 9 9   ALKPHOS 75 70 78 67  PROT 7.7 7.6 8.1 7.3  ALBUMIN 3.9 3.7 4.0 3.8    TUMOR MARKERS: No results for input(s): AFPTM, CEA, CA199, CHROMGRNA  in the last 8760 hours.  Assessment and Plan: Patient with complex medical history and multiple comorbidities presents with complaint of newly diagnosed lung adenocarcinoma.  IR consulted for Port-A-Cath placement at the request of Dr. Sherrod. Case reviewed by Dr. Luverne who approves patient for procedure.  Patient presents today in their usual state of health.  She has been NPO.   Risks and benefits of image guided port-a-catheter placement was discussed with the patient including, but not limited to bleeding, infection, pneumothorax, or fibrin sheath development and need for additional procedures.  All of the patient's questions were answered, patient is agreeable to proceed. Consent signed and in chart.    Thank you for this interesting consult.  I greatly enjoyed meeting Kelly Gibson and look forward to participating in their care.  A copy of this report was sent to the requesting provider on this date.  Electronically Signed: Alphonzo Devera Sue-Ellen Kiowa Hollar, PA 03/09/2024, 1:30 PM   I spent a total of  30 Minutes   in face to face in clinical consultation, greater than 50% of which was counseling/coordinating care for lung adenocarcinoma.

## 2024-03-10 ENCOUNTER — Ambulatory Visit
Admission: RE | Admit: 2024-03-10 | Discharge: 2024-03-10 | Disposition: A | Discharge status: Home / Self Care | Source: Ambulatory Visit | Attending: Radiation Oncology | Admitting: Radiation Oncology

## 2024-03-10 ENCOUNTER — Other Ambulatory Visit: Payer: Self-pay

## 2024-03-10 DIAGNOSIS — Z510 Encounter for antineoplastic radiation therapy: Secondary | ICD-10-CM | POA: Diagnosis not present

## 2024-03-10 DIAGNOSIS — Z87891 Personal history of nicotine dependence: Secondary | ICD-10-CM | POA: Diagnosis not present

## 2024-03-10 DIAGNOSIS — C3432 Malignant neoplasm of lower lobe, left bronchus or lung: Secondary | ICD-10-CM | POA: Diagnosis not present

## 2024-03-10 LAB — RAD ONC ARIA SESSION SUMMARY
Course Elapsed Days: 11
Plan Fractions Treated to Date: 10
Plan Prescribed Dose Per Fraction: 2 Gy
Plan Total Fractions Prescribed: 30
Plan Total Prescribed Dose: 60 Gy
Reference Point Dosage Given to Date: 20 Gy
Reference Point Session Dosage Given: 2 Gy
Session Number: 10

## 2024-03-13 ENCOUNTER — Other Ambulatory Visit: Payer: Self-pay

## 2024-03-13 ENCOUNTER — Ambulatory Visit
Admission: RE | Admit: 2024-03-13 | Discharge: 2024-03-13 | Disposition: A | Discharge status: Home / Self Care | Source: Ambulatory Visit | Attending: Radiation Oncology

## 2024-03-13 DIAGNOSIS — C3432 Malignant neoplasm of lower lobe, left bronchus or lung: Secondary | ICD-10-CM | POA: Diagnosis not present

## 2024-03-13 DIAGNOSIS — Z87891 Personal history of nicotine dependence: Secondary | ICD-10-CM | POA: Diagnosis not present

## 2024-03-13 DIAGNOSIS — Z510 Encounter for antineoplastic radiation therapy: Secondary | ICD-10-CM | POA: Diagnosis not present

## 2024-03-13 LAB — RAD ONC ARIA SESSION SUMMARY
Course Elapsed Days: 14
Plan Fractions Treated to Date: 11
Plan Prescribed Dose Per Fraction: 2 Gy
Plan Total Fractions Prescribed: 30
Plan Total Prescribed Dose: 60 Gy
Reference Point Dosage Given to Date: 22 Gy
Reference Point Session Dosage Given: 2 Gy
Session Number: 11

## 2024-03-14 ENCOUNTER — Inpatient Hospital Stay

## 2024-03-14 ENCOUNTER — Ambulatory Visit
Admission: RE | Admit: 2024-03-14 | Discharge: 2024-03-14 | Disposition: A | Discharge status: Home / Self Care | Source: Ambulatory Visit | Attending: Radiation Oncology | Admitting: Radiation Oncology

## 2024-03-14 ENCOUNTER — Other Ambulatory Visit: Payer: Self-pay

## 2024-03-14 VITALS — BP 158/78 | HR 56 | Temp 97.9°F | Resp 18

## 2024-03-14 DIAGNOSIS — Z510 Encounter for antineoplastic radiation therapy: Secondary | ICD-10-CM | POA: Diagnosis not present

## 2024-03-14 DIAGNOSIS — C3432 Malignant neoplasm of lower lobe, left bronchus or lung: Secondary | ICD-10-CM

## 2024-03-14 DIAGNOSIS — Z95828 Presence of other vascular implants and grafts: Secondary | ICD-10-CM | POA: Insufficient documentation

## 2024-03-14 DIAGNOSIS — Z87891 Personal history of nicotine dependence: Secondary | ICD-10-CM | POA: Diagnosis not present

## 2024-03-14 LAB — CMP (CANCER CENTER ONLY)
ALT: 9 U/L (ref 0–44)
AST: 14 U/L — ABNORMAL LOW (ref 15–41)
Albumin: 3.6 g/dL (ref 3.5–5.0)
Alkaline Phosphatase: 58 U/L (ref 38–126)
Anion gap: 6 (ref 5–15)
BUN: 18 mg/dL (ref 8–23)
CO2: 29 mmol/L (ref 22–32)
Calcium: 8.9 mg/dL (ref 8.9–10.3)
Chloride: 107 mmol/L (ref 98–111)
Creatinine: 0.89 mg/dL (ref 0.44–1.00)
GFR, Estimated: >60 mL/min (ref >60)
Glucose, Bld: 67 mg/dL — ABNORMAL LOW (ref 70–99)
Potassium: 3.8 mmol/L (ref 3.5–5.1)
Sodium: 142 mmol/L (ref 135–145)
Total Bilirubin: 0.4 mg/dL (ref 0.0–1.2)
Total Protein: 6.8 g/dL (ref 6.5–8.1)

## 2024-03-14 LAB — RAD ONC ARIA SESSION SUMMARY
Course Elapsed Days: 15
Plan Fractions Treated to Date: 12
Plan Prescribed Dose Per Fraction: 2 Gy
Plan Total Fractions Prescribed: 30
Plan Total Prescribed Dose: 60 Gy
Reference Point Dosage Given to Date: 24 Gy
Reference Point Session Dosage Given: 2 Gy
Session Number: 12

## 2024-03-14 LAB — CBC WITH DIFFERENTIAL (CANCER CENTER ONLY)
Abs Immature Granulocytes: 0.01 K/uL (ref 0.00–0.07)
Basophils Absolute: 0 K/uL (ref 0.0–0.1)
Basophils Relative: 1 %
Eosinophils Absolute: 0.1 K/uL (ref 0.0–0.5)
Eosinophils Relative: 3 %
HCT: 31.4 % — ABNORMAL LOW (ref 36.0–46.0)
Hemoglobin: 10.2 g/dL — ABNORMAL LOW (ref 12.0–15.0)
Immature Granulocytes: 0 %
Lymphocytes Relative: 8 %
Lymphs Abs: 0.3 K/uL — ABNORMAL LOW (ref 0.7–4.0)
MCH: 26.4 pg (ref 26.0–34.0)
MCHC: 32.5 g/dL (ref 30.0–36.0)
MCV: 81.3 fL (ref 80.0–100.0)
Monocytes Absolute: 0.3 K/uL (ref 0.1–1.0)
Monocytes Relative: 11 %
Neutro Abs: 2.5 K/uL (ref 1.7–7.7)
Neutrophils Relative %: 77 %
Platelet Count: 229 K/uL (ref 150–400)
RBC: 3.86 MIL/uL — ABNORMAL LOW (ref 3.87–5.11)
RDW: 17 % — ABNORMAL HIGH (ref 11.5–15.5)
WBC Count: 3.2 K/uL — ABNORMAL LOW (ref 4.0–10.5)
nRBC: 0 % (ref 0.0–0.2)

## 2024-03-14 MED ORDER — SODIUM CHLORIDE 0.9 % IV SOLN
45.0000 mg/m2 | Freq: Once | INTRAVENOUS | Status: AC
  Administered 2024-03-14: 90 mg via INTRAVENOUS
  Filled 2024-03-14: qty 15

## 2024-03-14 MED ORDER — PALONOSETRON HCL INJECTION 0.25 MG/5ML
0.2500 mg | Freq: Once | INTRAVENOUS | Status: AC
  Administered 2024-03-14: 0.25 mg via INTRAVENOUS
  Filled 2024-03-14: qty 5

## 2024-03-14 MED ORDER — DEXAMETHASONE SODIUM PHOSPHATE 10 MG/ML IJ SOLN
10.0000 mg | Freq: Once | INTRAMUSCULAR | Status: AC
  Administered 2024-03-14: 10 mg via INTRAVENOUS
  Filled 2024-03-14: qty 1

## 2024-03-14 MED ORDER — FAMOTIDINE IN NACL 20-0.9 MG/50ML-% IV SOLN
20.0000 mg | Freq: Once | INTRAVENOUS | Status: AC
  Administered 2024-03-14: 20 mg via INTRAVENOUS
  Filled 2024-03-14: qty 50

## 2024-03-14 MED ORDER — DIPHENHYDRAMINE HCL 50 MG/ML IJ SOLN
50.0000 mg | Freq: Once | INTRAMUSCULAR | Status: AC
  Administered 2024-03-14: 50 mg via INTRAVENOUS
  Filled 2024-03-14: qty 1

## 2024-03-14 MED ORDER — SODIUM CHLORIDE 0.9% FLUSH
10.0000 mL | Freq: Once | INTRAVENOUS | Status: AC
  Administered 2024-03-14: 10 mL

## 2024-03-14 MED ORDER — SODIUM CHLORIDE 0.9 % IV SOLN
146.6000 mg | Freq: Once | INTRAVENOUS | Status: AC
  Administered 2024-03-14: 150 mg via INTRAVENOUS
  Filled 2024-03-14: qty 15

## 2024-03-14 MED ORDER — SODIUM CHLORIDE 0.9% FLUSH: 10.0000 mL | INTRAVENOUS | Status: DC | PRN

## 2024-03-14 MED ORDER — SODIUM CHLORIDE 0.9 % IV SOLN: INTRAVENOUS | Status: DC

## 2024-03-14 NOTE — Patient Instructions (Signed)

## 2024-03-14 NOTE — Patient Instructions (Signed)
 [facility] - A DEPT OF [facility]  Discharge Instructions: Thank you for choosing [facility] to provide your oncology and hematology care.   If you have a lab appointment with the Cancer Center, please go directly to the Cancer Center and check in at the registration area.   Wear comfortable clothing and clothing appropriate for easy access to any Portacath or PICC line.   We strive to give you quality time with your provider. You may need to reschedule your appointment if you arrive late (15 or more minutes).  Arriving late affects you and other patients whose appointments are after yours.  Also, if you miss three or more appointments without notifying the office, you may be dismissed from the clinic at the provider's discretion.      For prescription refill requests, have your pharmacy contact our office and allow 72 hours for refills to be completed.    Today you received the following chemotherapy and/or immunotherapy agents: Paclitaxel  (Taxol ) and Carboplatin    To help prevent nausea and vomiting after your treatment, we encourage you to take your nausea medication as directed.  BELOW ARE SYMPTOMS THAT SHOULD BE REPORTED IMMEDIATELY: *FEVER GREATER THAN 100.4 F (38 C) OR HIGHER *CHILLS OR SWEATING *NAUSEA AND VOMITING THAT IS NOT CONTROLLED WITH YOUR NAUSEA MEDICATION *UNUSUAL SHORTNESS OF BREATH *UNUSUAL BRUISING OR BLEEDING *URINARY PROBLEMS (pain or burning when urinating, or frequent urination) *BOWEL PROBLEMS (unusual diarrhea, constipation, pain near the anus) TENDERNESS IN MOUTH AND THROAT WITH OR WITHOUT PRESENCE OF ULCERS (sore throat, sores in mouth, or a toothache) UNUSUAL RASH, SWELLING OR PAIN  UNUSUAL VAGINAL DISCHARGE OR ITCHING   Items with * indicate a potential emergency and should be followed up as soon as possible or go to the Emergency Department if any problems should occur.  Please show the CHEMOTHERAPY ALERT  CARD or IMMUNOTHERAPY ALERT CARD at check-in to the Emergency Department and triage nurse.  Should you have questions after your visit or need to cancel or reschedule your appointment, please contact [facility] - A DEPT [facility]  Dept: [phone]  and follow the prompts.  Office hours are 8:00 a.m. to 4:30 p.m. Monday - Friday. Please note that voicemails left after 4:00 p.m. may not be returned until the following business day.  We are closed weekends and major holidays. You have access to a nurse at all times for urgent questions. Please call the main number to the clinic Dept: [phone] and follow the prompts.   For any non-urgent questions, you may also contact your provider using MyChart. We now offer e-Visits for anyone 61 and older to request care online for non-urgent symptoms. For details visit [email or web address].   Also download the MyChart app! Go to the app store, search MyChart, open the app, select [place], and log in with your MyChart username and password.

## 2024-03-14 NOTE — Progress Notes (Signed)
 Maintain carbo dose=150mg  despite improvement in renal fxn per Dr. Sherrod.  Kelly Gibson, PharmD, MBA

## 2024-03-15 ENCOUNTER — Other Ambulatory Visit: Payer: Self-pay

## 2024-03-15 ENCOUNTER — Ambulatory Visit
Admission: RE | Admit: 2024-03-15 | Discharge: 2024-03-15 | Disposition: A | Discharge status: Home / Self Care | Source: Ambulatory Visit | Attending: Radiation Oncology | Admitting: Radiation Oncology

## 2024-03-15 DIAGNOSIS — C3432 Malignant neoplasm of lower lobe, left bronchus or lung: Secondary | ICD-10-CM | POA: Diagnosis not present

## 2024-03-15 DIAGNOSIS — Z510 Encounter for antineoplastic radiation therapy: Secondary | ICD-10-CM | POA: Diagnosis not present

## 2024-03-15 DIAGNOSIS — Z87891 Personal history of nicotine dependence: Secondary | ICD-10-CM | POA: Diagnosis not present

## 2024-03-15 LAB — RAD ONC ARIA SESSION SUMMARY
Course Elapsed Days: 16
Plan Fractions Treated to Date: 13
Plan Prescribed Dose Per Fraction: 2 Gy
Plan Total Fractions Prescribed: 30
Plan Total Prescribed Dose: 60 Gy
Reference Point Dosage Given to Date: 26 Gy
Reference Point Session Dosage Given: 2 Gy
Session Number: 13

## 2024-03-16 ENCOUNTER — Telehealth: Payer: Self-pay

## 2024-03-16 ENCOUNTER — Other Ambulatory Visit: Payer: Self-pay

## 2024-03-16 ENCOUNTER — Ambulatory Visit
Admission: RE | Admit: 2024-03-16 | Discharge: 2024-03-16 | Disposition: A | Discharge status: Home / Self Care | Source: Ambulatory Visit | Attending: Radiation Oncology | Admitting: Radiation Oncology

## 2024-03-16 DIAGNOSIS — C3432 Malignant neoplasm of lower lobe, left bronchus or lung: Secondary | ICD-10-CM | POA: Diagnosis not present

## 2024-03-16 DIAGNOSIS — Z510 Encounter for antineoplastic radiation therapy: Secondary | ICD-10-CM | POA: Diagnosis not present

## 2024-03-16 DIAGNOSIS — Z87891 Personal history of nicotine dependence: Secondary | ICD-10-CM | POA: Diagnosis not present

## 2024-03-16 LAB — RAD ONC ARIA SESSION SUMMARY
Course Elapsed Days: 17
Plan Fractions Treated to Date: 14
Plan Prescribed Dose Per Fraction: 2 Gy
Plan Total Fractions Prescribed: 30
Plan Total Prescribed Dose: 60 Gy
Reference Point Dosage Given to Date: 28 Gy
Reference Point Session Dosage Given: 2 Gy
Session Number: 14

## 2024-03-16 NOTE — Progress Notes (Signed)
   03/16/2024  Patient ID: Kelly Gibson, female   DOB: 01/20/1961, 63 y.o.   MRN: [id number]  Contacted patient and spoke with spouse via telephone. Reminded them that patient assistance program paperwork is up front awaiting signatures. Offered to place in mail, declined. State they will come by office today to sign.  Jon VEAR Lindau, PharmD Clinical Pharmacist [phone]

## 2024-03-17 ENCOUNTER — Other Ambulatory Visit: Payer: Self-pay

## 2024-03-17 ENCOUNTER — Ambulatory Visit
Admission: RE | Admit: 2024-03-17 | Discharge: 2024-03-17 | Disposition: A | Discharge status: Home / Self Care | Source: Ambulatory Visit | Attending: Radiation Oncology | Admitting: Radiation Oncology

## 2024-03-17 DIAGNOSIS — C3432 Malignant neoplasm of lower lobe, left bronchus or lung: Secondary | ICD-10-CM | POA: Diagnosis not present

## 2024-03-17 DIAGNOSIS — Z87891 Personal history of nicotine dependence: Secondary | ICD-10-CM | POA: Diagnosis not present

## 2024-03-17 DIAGNOSIS — Z510 Encounter for antineoplastic radiation therapy: Secondary | ICD-10-CM | POA: Diagnosis not present

## 2024-03-17 LAB — RAD ONC ARIA SESSION SUMMARY
Course Elapsed Days: 18
Plan Fractions Treated to Date: 15
Plan Prescribed Dose Per Fraction: 2 Gy
Plan Total Fractions Prescribed: 30
Plan Total Prescribed Dose: 60 Gy
Reference Point Dosage Given to Date: 30 Gy
Reference Point Session Dosage Given: 2 Gy
Session Number: 15

## 2024-03-19 DIAGNOSIS — J449 Chronic obstructive pulmonary disease, unspecified: Secondary | ICD-10-CM | POA: Diagnosis not present

## 2024-03-20 ENCOUNTER — Ambulatory Visit
Admission: RE | Admit: 2024-03-20 | Discharge: 2024-03-20 | Disposition: A | Discharge status: Home / Self Care | Source: Ambulatory Visit | Attending: Radiation Oncology | Admitting: Radiation Oncology

## 2024-03-20 ENCOUNTER — Other Ambulatory Visit: Payer: Self-pay

## 2024-03-20 DIAGNOSIS — C3432 Malignant neoplasm of lower lobe, left bronchus or lung: Secondary | ICD-10-CM | POA: Diagnosis not present

## 2024-03-20 DIAGNOSIS — Z510 Encounter for antineoplastic radiation therapy: Secondary | ICD-10-CM | POA: Diagnosis not present

## 2024-03-20 DIAGNOSIS — Z87891 Personal history of nicotine dependence: Secondary | ICD-10-CM | POA: Diagnosis not present

## 2024-03-20 LAB — RAD ONC ARIA SESSION SUMMARY
Course Elapsed Days: 21
Plan Fractions Treated to Date: 16
Plan Prescribed Dose Per Fraction: 2 Gy
Plan Total Fractions Prescribed: 30
Plan Total Prescribed Dose: 60 Gy
Reference Point Dosage Given to Date: 32 Gy
Reference Point Session Dosage Given: 2 Gy
Session Number: 16

## 2024-03-21 ENCOUNTER — Other Ambulatory Visit: Payer: Self-pay

## 2024-03-21 ENCOUNTER — Inpatient Hospital Stay

## 2024-03-21 ENCOUNTER — Ambulatory Visit
Admission: RE | Admit: 2024-03-21 | Discharge: 2024-03-21 | Disposition: A | Discharge status: Home / Self Care | Source: Ambulatory Visit | Attending: Radiation Oncology

## 2024-03-21 ENCOUNTER — Other Ambulatory Visit

## 2024-03-21 ENCOUNTER — Inpatient Hospital Stay (HOSPITAL_BASED_OUTPATIENT_CLINIC_OR_DEPARTMENT_OTHER): Admitting: Internal Medicine

## 2024-03-21 VITALS — BP 144/75 | HR 62 | Temp 97.7°F | Resp 17 | Ht 63.0 in | Wt 184.7 lb

## 2024-03-21 DIAGNOSIS — C3432 Malignant neoplasm of lower lobe, left bronchus or lung: Secondary | ICD-10-CM

## 2024-03-21 DIAGNOSIS — Z510 Encounter for antineoplastic radiation therapy: Secondary | ICD-10-CM | POA: Diagnosis not present

## 2024-03-21 DIAGNOSIS — Z95828 Presence of other vascular implants and grafts: Secondary | ICD-10-CM

## 2024-03-21 DIAGNOSIS — Z87891 Personal history of nicotine dependence: Secondary | ICD-10-CM | POA: Diagnosis not present

## 2024-03-21 LAB — RAD ONC ARIA SESSION SUMMARY
Course Elapsed Days: 22
Plan Fractions Treated to Date: 17
Plan Prescribed Dose Per Fraction: 2 Gy
Plan Total Fractions Prescribed: 30
Plan Total Prescribed Dose: 60 Gy
Reference Point Dosage Given to Date: 34 Gy
Reference Point Session Dosage Given: 2 Gy
Session Number: 17

## 2024-03-21 LAB — CMP (CANCER CENTER ONLY)
ALT: 8 U/L (ref 0–44)
AST: 16 U/L (ref 15–41)
Albumin: 3.5 g/dL (ref 3.5–5.0)
Alkaline Phosphatase: 59 U/L (ref 38–126)
Anion gap: 5 (ref 5–15)
BUN: 17 mg/dL (ref 8–23)
CO2: 30 mmol/L (ref 22–32)
Calcium: 8.9 mg/dL (ref 8.9–10.3)
Chloride: 108 mmol/L (ref 98–111)
Creatinine: 0.94 mg/dL (ref 0.44–1.00)
GFR, Estimated: >60 mL/min (ref >60)
Glucose, Bld: 81 mg/dL (ref 70–99)
Potassium: 3.7 mmol/L (ref 3.5–5.1)
Sodium: 143 mmol/L (ref 135–145)
Total Bilirubin: 0.5 mg/dL (ref 0.0–1.2)
Total Protein: 6.6 g/dL (ref 6.5–8.1)

## 2024-03-21 LAB — CBC WITH DIFFERENTIAL (CANCER CENTER ONLY)
Abs Immature Granulocytes: 0.03 K/uL (ref 0.00–0.07)
Basophils Absolute: 0 K/uL (ref 0.0–0.1)
Basophils Relative: 1 %
Eosinophils Absolute: 0.1 K/uL (ref 0.0–0.5)
Eosinophils Relative: 3 %
HCT: 31.7 % — ABNORMAL LOW (ref 36.0–46.0)
Hemoglobin: 10.3 g/dL — ABNORMAL LOW (ref 12.0–15.0)
Immature Granulocytes: 1 %
Lymphocytes Relative: 5 %
Lymphs Abs: 0.1 K/uL — ABNORMAL LOW (ref 0.7–4.0)
MCH: 26.5 pg (ref 26.0–34.0)
MCHC: 32.5 g/dL (ref 30.0–36.0)
MCV: 81.7 fL (ref 80.0–100.0)
Monocytes Absolute: 0.3 K/uL (ref 0.1–1.0)
Monocytes Relative: 9 %
Neutro Abs: 2.3 K/uL (ref 1.7–7.7)
Neutrophils Relative %: 81 %
Platelet Count: 198 K/uL (ref 150–400)
RBC: 3.88 MIL/uL (ref 3.87–5.11)
RDW: 18 % — ABNORMAL HIGH (ref 11.5–15.5)
WBC Count: 2.9 K/uL — ABNORMAL LOW (ref 4.0–10.5)
nRBC: 0 % (ref 0.0–0.2)

## 2024-03-21 MED ORDER — LIDOCAINE-PRILOCAINE 2.5-2.5 % EX CREA: TOPICAL_CREAM | CUTANEOUS | 1 refills | Status: AC

## 2024-03-21 MED ORDER — SODIUM CHLORIDE 0.9 % IV SOLN
146.6000 mg | Freq: Once | INTRAVENOUS | Status: AC
  Administered 2024-03-21: 150 mg via INTRAVENOUS
  Filled 2024-03-21: qty 15

## 2024-03-21 MED ORDER — SUCRALFATE 1 G PO TABS: 1.0000 g | ORAL_TABLET | Freq: Three times a day (TID) | ORAL | 1 refills | Status: DC

## 2024-03-21 MED ORDER — DEXAMETHASONE SODIUM PHOSPHATE 10 MG/ML IJ SOLN
10.0000 mg | Freq: Once | INTRAMUSCULAR | Status: AC
  Administered 2024-03-21: 10 mg via INTRAVENOUS
  Filled 2024-03-21: qty 1

## 2024-03-21 MED ORDER — SODIUM CHLORIDE 0.9% FLUSH
10.0000 mL | Freq: Once | INTRAVENOUS | Status: AC
  Administered 2024-03-21: 10 mL

## 2024-03-21 MED ORDER — DIPHENHYDRAMINE HCL 50 MG/ML IJ SOLN
50.0000 mg | Freq: Once | INTRAMUSCULAR | Status: AC
  Administered 2024-03-21: 50 mg via INTRAVENOUS
  Filled 2024-03-21: qty 1

## 2024-03-21 MED ORDER — SODIUM CHLORIDE 0.9 % IV SOLN
45.0000 mg/m2 | Freq: Once | INTRAVENOUS | Status: AC
  Administered 2024-03-21: 90 mg via INTRAVENOUS
  Filled 2024-03-21: qty 15

## 2024-03-21 MED ORDER — SODIUM CHLORIDE 0.9 % IV SOLN: INTRAVENOUS | Status: DC

## 2024-03-21 MED ORDER — PALONOSETRON HCL INJECTION 0.25 MG/5ML
0.2500 mg | Freq: Once | INTRAVENOUS | Status: AC
  Administered 2024-03-21: 0.25 mg via INTRAVENOUS
  Filled 2024-03-21: qty 5

## 2024-03-21 MED ORDER — FAMOTIDINE IN NACL 20-0.9 MG/50ML-% IV SOLN
20.0000 mg | Freq: Once | INTRAVENOUS | Status: AC
  Administered 2024-03-21: 20 mg via INTRAVENOUS
  Filled 2024-03-21: qty 50

## 2024-03-21 NOTE — Patient Instructions (Signed)

## 2024-03-21 NOTE — Patient Instructions (Signed)
 [facility] - A DEPT OF [facility]  Discharge Instructions: Thank you for choosing [facility] to provide your oncology and hematology care.   If you have a lab appointment with the Cancer Center, please go directly to the Cancer Center and check in at the registration area.   Wear comfortable clothing and clothing appropriate for easy access to any Portacath or PICC line.   We strive to give you quality time with your provider. You may need to reschedule your appointment if you arrive late (15 or more minutes).  Arriving late affects you and other patients whose appointments are after yours.  Also, if you miss three or more appointments without notifying the office, you may be dismissed from the clinic at the provider's discretion.      For prescription refill requests, have your pharmacy contact our office and allow 72 hours for refills to be completed.    Today you received the following chemotherapy and/or immunotherapy agents: Taxol/Carboplatin      To help prevent nausea and vomiting after your treatment, we encourage you to take your nausea medication as directed.  BELOW ARE SYMPTOMS THAT SHOULD BE REPORTED IMMEDIATELY: *FEVER GREATER THAN 100.4 F (38 C) OR HIGHER *CHILLS OR SWEATING *NAUSEA AND VOMITING THAT IS NOT CONTROLLED WITH YOUR NAUSEA MEDICATION *UNUSUAL SHORTNESS OF BREATH *UNUSUAL BRUISING OR BLEEDING *URINARY PROBLEMS (pain or burning when urinating, or frequent urination) *BOWEL PROBLEMS (unusual diarrhea, constipation, pain near the anus) TENDERNESS IN MOUTH AND THROAT WITH OR WITHOUT PRESENCE OF ULCERS (sore throat, sores in mouth, or a toothache) UNUSUAL RASH, SWELLING OR PAIN  UNUSUAL VAGINAL DISCHARGE OR ITCHING   Items with * indicate a potential emergency and should be followed up as soon as possible or go to the Emergency Department if any problems should occur.  Please show the CHEMOTHERAPY ALERT CARD or  IMMUNOTHERAPY ALERT CARD at check-in to the Emergency Department and triage nurse.  Should you have questions after your visit or need to cancel or reschedule your appointment, please contact [facility] - A DEPT OF [facility]  Dept: [phone]  and follow the prompts.  Office hours are 8:00 a.m. to 4:30 p.m. Monday - Friday. Please note that voicemails left after 4:00 p.m. may not be returned until the following business day.  We are closed weekends and major holidays. You have access to a nurse at all times for urgent questions. Please call the main number to the clinic Dept: [phone] and follow the prompts.   For any non-urgent questions, you may also contact your provider using MyChart. We now offer e-Visits for anyone 51 and older to request care online for non-urgent symptoms. For details visit [email or web address].   Also download the MyChart app! Go to the app store, search "MyChart", open the app, select [place], and log in with your MyChart username and password.

## 2024-03-21 NOTE — Progress Notes (Signed)
 [facility] [phone]   [phone]  OFFICE PROGRESS NOTE  Early, [name], NP [address] [place] [address]  DIAGNOSIS: Stage IIIC (T3, N3, M0) NSCLC, adenocarcinoma. She presented with left lower lobe mass and intermittent activity with nonenlarged mediastinal lymph nodes.  She was diagnosed in June 2025.  Biomarker Findings HRD signature - Cannot Be Determined Microsatellite status - Cannot Be Determined ? Tumor Mutational Burden - Cannot Be Determined Genomic Findings For a complete list of the genes assayed, please refer to the Appendix. KRAS G12V 7 Disease relevant genes with no reportable alterations: ALK, BRAF, EGFR, ERBB2, MET, RET, ROS1     PRIOR THERAPY: None   CURRENT THERAPY: Concurrent chemoradiation with weekly carboplatin  for an AUC of 2 and paclitaxel  45 mg/m.  First dose on 02/28/2024.  Status post 3 cycles.  INTERVAL HISTORY: Kelly Gibson 63 y.o. female returns to the clinic today for follow-up visit accompanied by her husband. Discussed the use of AI scribe software for clinical note transcription with the patient, who gave verbal consent to proceed.  History of Present Illness Kelly Gibson is a 63 year old female with stage 3C non-small cell lung cancer who presents for evaluation before starting cycle number four of chemoradiation. She is accompanied by her husband.  She is currently undergoing concurrent chemoradiation with weekly carboplatin  and paclitaxel , having completed three cycles. She is here for evaluation before starting cycle number four.  She experiences a 'cutting pain' in her chest, which she attributes to the radiation treatment. She also has difficulty swallowing at times, which has not been treated with medication yet. She mentions frequent choking, with a recent episode where she almost passed out, as observed by her husband.     MEDICAL HISTORY: Past Medical  History:  Diagnosis Date   Bradycardia    SR/SB with up to 3.5 second pause (asymptomatic) on ~ 10/2017 event monitor; saw EP Dr. Kelsie: avoid AV nodal blocking agents   Chronic kidney disease    COPD (chronic obstructive pulmonary disease) (HCC)    Depression    Depression with anxiety 08/29/2021   Diabetes mellitus    Dysarthria, post-stroke    Dysrhythmia    Ganglion cyst 03/09/2012   Heart block AV complete (HCC) 12/11/2023   History of kidney stones    Hyperlipidemia    Hypertension    Hypokalemia    Myocardial infarction [facility])    NSTEMI   Non-ST elevation (NSTEMI) myocardial infarction (HCC) 12/11/2023   Obesity, unspecified 12/17/2011   Pneumonia    Pneumonia of left lower lobe due to infectious organism 12/12/2023   PONV (postoperative nausea and vomiting)    Skin lesion of face 11/17/2022   Stroke (HCC)    2019   right side hemiparesis    ALLERGIES:  is allergic to latex and niacin.  MEDICATIONS:  Current Outpatient Medications  Medication Sig Dispense Refill   albuterol  (PROVENTIL ) (2.5 MG/3ML) 0.083% nebulizer solution Inhale 3 mLs (2.5 mg total) into the lungs every 6 (six) hours as needed for wheezing or shortness of breath. 75 mL 12   albuterol  (VENTOLIN  HFA) 108 (90 Base) MCG/ACT inhaler Inhale 2 puffs into the lungs every 6 (six) hours as needed for wheezing or shortness of breath. 18 g 2   amLODipine  (NORVASC ) 5 MG tablet Take 5 mg by mouth daily.     apixaban  (ELIQUIS ) 5 MG TABS tablet Take 1 tablet (5 mg total) by  mouth 2 (two) times daily. 60 tablet 3   aspirin  EC 81 MG tablet Take 1 tablet (81 mg total) by mouth daily. Swallow whole. 30 tablet 12   budesonide -glycopyrrolate -formoterol  (BREZTRI  AEROSPHERE) 160-9-4.8 MCG/ACT AERO inhaler Inhale 2 puffs into the lungs 2 (two) times daily. 10.7 g 1   cefadroxil  (DURICEF) 500 MG capsule Take 1 capsule (500 mg total) by mouth daily. 5 capsule 0   citalopram  (CELEXA ) 10 MG tablet TAKE 1 TABLET BY MOUTH DAILY 90  tablet 1   Continuous Glucose Sensor (DEXCOM G7 SENSOR) MISC Take 1 Device by mouth See admin instructions. Change sensor every 10 days     Doxylamine  Succinate, Sleep, (UNISOM  PO) Take 2 each by mouth at bedtime. Gel capsules     furosemide  (LASIX ) 20 MG tablet Take 1 tablet (20 mg total) by mouth daily as needed for fluid or edema (take if daily weight is greater than 2 lbs from the previous day's weight.). 90 tablet 0   HUMALOG  KWIKPEN 100 UNIT/ML KwikPen INJECT 3 TO 5 UNITS INTO THE SKIN DAILY BEFORE LUNCH 6 mL 3   hydrALAZINE  (APRESOLINE ) 100 MG tablet 1 tablet up to every 12 hours only as needed for blood pressure >150/90     insulin  degludec (TRESIBA  FLEXTOUCH) 100 UNIT/ML FlexTouch Pen Inject 10 Units into the skin 2 (two) times daily. Adjust by 2 units every 3 days as needed for blood sugars higher than 150 in the morning fasting. 9 mL 11   Melatonin 5 MG CHEW Chew 10 mg by mouth at bedtime.     Multiple Vitamins-Minerals (MULTIVITAMIN GUMMIES WOMENS) CHEW Chew 2 each by mouth in the morning.     olmesartan (BENICAR) 40 MG tablet Take 40 mg by mouth in the morning.     prochlorperazine  (COMPAZINE ) 10 MG tablet Take 1 tablet (10 mg total) by mouth every 6 (six) hours as needed. 30 tablet 2   rosuvastatin  (CRESTOR ) 40 MG tablet TAKE 1 TABLET BY MOUTH DAILY 90 tablet 3   UNABLE TO FIND Take 1 tablet by mouth with breakfast, with lunch, and with evening meal. GlucoGold -Berberine, Concentrated Cinnamon, Chromium, Banaba Leaf Extract     No current facility-administered medications for this visit.    SURGICAL HISTORY:  Past Surgical History:  Procedure Laterality Date   CESAREAN SECTION     x 3   IR IMAGING GUIDED PORT INSERTION  03/09/2024   LEFT HEART CATH AND CORONARY ANGIOGRAPHY N/A 12/28/2023   Procedure: LEFT HEART CATH AND CORONARY ANGIOGRAPHY;  Surgeon: Elmira Newman PARAS, MD;  Location: [facility] INVASIVE CV LAB;  Service: Cardiovascular;  Laterality: N/A;   ORIF WRIST FRACTURE Right  05/04/2018   Procedure: OPEN REDUCTION INTERNAL FIXATION (ORIF) RIGHT  WRIST FRACTURE;  Surgeon: Cristy Bonner DASEN, MD;  Location: [facility] OR;  Service: Orthopedics;  Laterality: Right;   TUMOR REMOVAL     left shoulder   VIDEO BRONCHOSCOPY WITH ENDOBRONCHIAL NAVIGATION  01/03/2024   Procedure: VIDEO BRONCHOSCOPY WITH ENDOBRONCHIAL NAVIGATION;  Surgeon: Shelah Lamar RAMAN, MD;  Location: [facility] ENDOSCOPY;  Service: Pulmonary;;   VIDEO BRONCHOSCOPY WITH ENDOBRONCHIAL ULTRASOUND Bilateral 01/03/2024   Procedure: BRONCHOSCOPY, WITH EBUS;  Surgeon: Shelah Lamar RAMAN, MD;  Location[facility];  Service: Pulmonary;  Laterality: Bilateral;    REVIEW OF SYSTEMS:  Constitutional: positive for fatigue Eyes: negative Ears, nose, mouth, throat, and face: negative Respiratory: positive for dyspnea on exertion Cardiovascular: negative Gastrointestinal: positive for odynophagia Genitourinary:negative Integument/breast: negative Hematologic/lymphatic: negative Musculoskeletal:positive for muscle weakness Neurological: negative  Behavioral/Psych: negative Endocrine: negative Allergic/Immunologic: negative   PHYSICAL EXAMINATION: General appearance: alert, cooperative, fatigued, and no distress Head: Normocephalic, without obvious abnormality, atraumatic Neck: no adenopathy, no JVD, supple, symmetrical, trachea midline, and thyroid not enlarged, symmetric, no tenderness/mass/nodules Lymph nodes: Cervical, supraclavicular, and axillary nodes normal. Resp: clear to auscultation bilaterally Back: symmetric, no curvature. ROM normal. No CVA tenderness. Cardio: regular rate and rhythm, S1, S2 normal, no murmur, click, rub or gallop GI: soft, non-tender; bowel sounds normal; no masses,  no organomegaly Extremities: extremities normal, atraumatic, no cyanosis or edema Neurologic: Alert and oriented X 3, normal strength and tone. Normal symmetric reflexes. Normal coordination and gait  ECOG PERFORMANCE STATUS: 1 - Symptomatic  but completely ambulatory  Blood pressure (!) 144/75, pulse 62, temperature 97.7 F (36.5 C), resp. rate 17, height 5' 3 (1.6 m), weight 184 lb 11.2 oz (83.8 kg), last menstrual period 12/10/2012, SpO2 99%.  LABORATORY DATA: Lab Results  Component Value Date   WBC 2.9 (L) 03/21/2024   HGB 10.3 (L) 03/21/2024   HCT 31.7 (L) 03/21/2024   MCV 81.7 03/21/2024   PLT 198 03/21/2024      Chemistry      Component Value Date/Time   NA 143 03/21/2024 1055   NA 144 01/10/2024 1508   K 3.7 03/21/2024 1055   CL 108 03/21/2024 1055   CO2 30 03/21/2024 1055   BUN 17 03/21/2024 1055   BUN 27 01/10/2024 1508   CREATININE 0.94 03/21/2024 1055   CREATININE 1.12 (H) 05/22/2020 1211      Component Value Date/Time   CALCIUM  8.9 03/21/2024 1055   CALCIUM  10.0 08/18/2023 0000   ALKPHOS 59 03/21/2024 1055   AST 16 03/21/2024 1055   ALT 8 03/21/2024 1055   BILITOT 0.5 03/21/2024 1055       RADIOGRAPHIC STUDIES: IR IMAGING GUIDED PORT INSERTION Result Date: 03/09/2024 CLINICAL DATA:  Left lower lobe lung carcinoma and need for porta cath for chemotherapy. EXAM: IMPLANTED PORT A CATH PLACEMENT WITH ULTRASOUND AND FLUOROSCOPIC GUIDANCE ANESTHESIA/SEDATION: Moderate (conscious) sedation was employed during this procedure. A total of Versed  1.5 mg and Fentanyl  50 mcg was administered intravenously. Moderate Sedation Time: 38 minutes. The patient's level of consciousness and vital signs were monitored continuously by radiology nursing throughout the procedure under my direct supervision. FLUOROSCOPY: Radiation Exposure Index: 6.0 mGy Kerma PROCEDURE: The procedure, risks, benefits, and alternatives were explained to the patient. Questions regarding the procedure were encouraged and answered. The patient understands and consents to the procedure. A time-out was performed prior to initiating the procedure. Ultrasound was utilized to confirm patency of the right internal jugular vein. An ultrasound image was  saved and recorded. The right neck and chest were prepped with chlorhexidine  in a sterile fashion, and a sterile drape was applied covering the operative field. Maximum barrier sterile technique with sterile gowns and gloves were used for the procedure. Local anesthesia was provided with 1% lidocaine . After creating a small venotomy incision, a 21 gauge needle was advanced into the right internal jugular vein under direct, real-time ultrasound guidance. Ultrasound image documentation was performed. After securing guidewire access, an 8 Fr dilator was placed. A J-wire was kinked to measure appropriate catheter length. A subcutaneous port pocket was then created along the upper chest wall utilizing sharp and blunt dissection. Portable cautery was utilized. The pocket was irrigated with sterile saline. A single lumen power injectable port was chosen for placement. The 8 Fr catheter was tunneled from the port pocket site  to the venotomy incision. The port was placed in the pocket. External catheter was trimmed to appropriate length based on guidewire measurement. At the venotomy, an 8 Fr peel-away sheath was placed over a guidewire. The catheter was then placed through the sheath and the sheath removed. Final catheter positioning was confirmed and documented with a fluoroscopic spot image. The port was accessed with a needle and aspirated and flushed with heparinized saline. The access needle was removed. The venotomy and port pocket incisions were closed with subcutaneous 3-0 Monocryl and subcuticular 4-0 Vicryl. Dermabond was applied to both incisions. COMPLICATIONS: COMPLICATIONS None FINDINGS: After catheter placement, the tip lies at the cavo-atrial junction. The catheter aspirates normally and is ready for immediate use. IMPRESSION: Placement of single lumen port a cath via right internal jugular vein. The catheter tip lies at the cavo-atrial junction. A power injectable port a cath was placed and is ready for  immediate use. Electronically Signed   By: Marcey Moan M.D.   On: 03/09/2024 15:06    ASSESSMENT AND PLAN: This is a very pleasant 63 years old African-American female with Stage IIIC (T3, N3, M0) NSCLC, adenocarcinoma. She presented with left lower lobe mass and intermittent activity with nonenlarged mediastinal lymph nodes.  She was diagnosed in June 2025. Molecular studies showed no actionable mutations. The patient is currently undergoing a course of concurrent chemoradiation with weekly carboplatin  for AUC of 2 and paclitaxel  45 Mg/M2 status post 3 cycles. Assessment and Plan Assessment & Plan Stage 3C non-small cell lung adenocarcinoma undergoing concurrent chemoradiation Stage 3C non-small cell lung adenocarcinoma diagnosed in June 2025. Currently undergoing concurrent chemoradiation with weekly carboplatin  and paclitaxel . Lab work is satisfactory for proceeding with chemotherapy. - Proceed with cycle number four of concurrent chemoradiation today.  Radiation-induced esophagitis with dysphagia Radiation-induced esophagitis causing chest pain and dysphagia. Reports intermittent dysphagia and choking episodes, with a recent severe choking incident. Radiation field adjustment may be necessary to mitigate worsening symptoms. - Prescribe Carafate  1 gram every six hours, half an hour before meals and at bedtime. - Discuss with radiation oncologist to adjust radiation field to address esophagitis. - Prescribe lidocaine  for port site application. She was advised to call immediately if she has any concerning symptoms in the interval. The patient voices understanding of current disease status and treatment options and is in agreement with the current care plan.  All questions were answered. The patient knows to call the clinic with any problems, questions or concerns. We can certainly see the patient much sooner if necessary.  The total time spent in the appointment was 30 minutes including  review of chart and various tests results, discussions about plan of care and coordination of care plan .   Disclaimer: This note was dictated with voice recognition software. Similar sounding words can inadvertently be transcribed and may not be corrected upon review.

## 2024-03-22 ENCOUNTER — Other Ambulatory Visit: Payer: Self-pay

## 2024-03-22 ENCOUNTER — Ambulatory Visit
Admission: RE | Admit: 2024-03-22 | Discharge: 2024-03-22 | Disposition: A | Discharge status: Home / Self Care | Source: Ambulatory Visit | Attending: Radiation Oncology | Admitting: Radiation Oncology

## 2024-03-22 DIAGNOSIS — C3432 Malignant neoplasm of lower lobe, left bronchus or lung: Secondary | ICD-10-CM | POA: Diagnosis not present

## 2024-03-22 DIAGNOSIS — Z510 Encounter for antineoplastic radiation therapy: Secondary | ICD-10-CM | POA: Diagnosis not present

## 2024-03-22 DIAGNOSIS — Z87891 Personal history of nicotine dependence: Secondary | ICD-10-CM | POA: Diagnosis not present

## 2024-03-22 LAB — RAD ONC ARIA SESSION SUMMARY
Course Elapsed Days: 23
Plan Fractions Treated to Date: 18
Plan Prescribed Dose Per Fraction: 2 Gy
Plan Total Fractions Prescribed: 30
Plan Total Prescribed Dose: 60 Gy
Reference Point Dosage Given to Date: 36 Gy
Reference Point Session Dosage Given: 2 Gy
Session Number: 18

## 2024-03-23 ENCOUNTER — Telehealth: Payer: Self-pay

## 2024-03-23 ENCOUNTER — Other Ambulatory Visit: Payer: Self-pay

## 2024-03-23 ENCOUNTER — Ambulatory Visit
Admission: RE | Admit: 2024-03-23 | Discharge: 2024-03-23 | Disposition: A | Discharge status: Home / Self Care | Source: Ambulatory Visit | Attending: Radiation Oncology | Admitting: Radiation Oncology

## 2024-03-23 DIAGNOSIS — C3432 Malignant neoplasm of lower lobe, left bronchus or lung: Secondary | ICD-10-CM | POA: Diagnosis not present

## 2024-03-23 DIAGNOSIS — Z87891 Personal history of nicotine dependence: Secondary | ICD-10-CM | POA: Diagnosis not present

## 2024-03-23 DIAGNOSIS — Z510 Encounter for antineoplastic radiation therapy: Secondary | ICD-10-CM | POA: Diagnosis not present

## 2024-03-23 LAB — RAD ONC ARIA SESSION SUMMARY
Course Elapsed Days: 24
Plan Fractions Treated to Date: 19
Plan Prescribed Dose Per Fraction: 2 Gy
Plan Total Fractions Prescribed: 30
Plan Total Prescribed Dose: 60 Gy
Reference Point Dosage Given to Date: 38 Gy
Reference Point Session Dosage Given: 2 Gy
Session Number: 19

## 2024-03-23 NOTE — Progress Notes (Signed)
   03/23/2024  Patient ID: Kelly Gibson, female   DOB: 02/28/1961, 63 y.o.   MRN: [id number]  Patient returned their portion of [employer] PAP for Breztri  and Novo [employer]isk PAP for Tresiba /Novolog /Pen Needles.  Submitted completed applications, pending company response.  Jon VEAR Lindau, PharmD Clinical Pharmacist [phone]

## 2024-03-24 ENCOUNTER — Other Ambulatory Visit: Payer: Self-pay | Admitting: Radiation Oncology

## 2024-03-24 ENCOUNTER — Telehealth: Payer: Self-pay

## 2024-03-24 ENCOUNTER — Ambulatory Visit
Admission: RE | Admit: 2024-03-24 | Discharge: 2024-03-24 | Disposition: A | Discharge status: Home / Self Care | Source: Ambulatory Visit | Attending: Radiation Oncology | Admitting: Radiation Oncology

## 2024-03-24 ENCOUNTER — Other Ambulatory Visit: Payer: Self-pay

## 2024-03-24 DIAGNOSIS — Z510 Encounter for antineoplastic radiation therapy: Secondary | ICD-10-CM | POA: Diagnosis not present

## 2024-03-24 DIAGNOSIS — C3432 Malignant neoplasm of lower lobe, left bronchus or lung: Secondary | ICD-10-CM | POA: Diagnosis not present

## 2024-03-24 DIAGNOSIS — Z87891 Personal history of nicotine dependence: Secondary | ICD-10-CM | POA: Diagnosis not present

## 2024-03-24 LAB — RAD ONC ARIA SESSION SUMMARY
Course Elapsed Days: 25
Plan Fractions Treated to Date: 20
Plan Prescribed Dose Per Fraction: 2 Gy
Plan Total Fractions Prescribed: 30
Plan Total Prescribed Dose: 60 Gy
Reference Point Dosage Given to Date: 40 Gy
Reference Point Session Dosage Given: 2 Gy
Session Number: 20

## 2024-03-24 NOTE — Telephone Encounter (Signed)
 Gave [employer] a call to follow up on PAP Breztri ,spoke with a representative they have received application and it is approved thru 07/26/24,and will be mail out to pt 's home with in 10-14 days.

## 2024-03-24 NOTE — Progress Notes (Signed)
   03/24/2024  Patient ID: Adria LELON Molt, female   DOB: 03/18/1961, 63 y.o.   MRN: [id number]  Attempted to contact patient to schedule follow up appointment for medication mgmt and DM.SABRA Left HIPAA compliant message for patient to return my call at their convenience.   Jon VEAR Lindau, PharmD Clinical Pharmacist [phone]

## 2024-03-28 ENCOUNTER — Encounter: Payer: Self-pay | Admitting: Internal Medicine

## 2024-03-28 ENCOUNTER — Other Ambulatory Visit: Payer: Self-pay

## 2024-03-28 ENCOUNTER — Other Ambulatory Visit

## 2024-03-28 ENCOUNTER — Inpatient Hospital Stay

## 2024-03-28 ENCOUNTER — Telehealth: Payer: Self-pay

## 2024-03-28 ENCOUNTER — Ambulatory Visit
Admission: RE | Admit: 2024-03-28 | Discharge: 2024-03-28 | Disposition: A | Discharge status: Home / Self Care | Source: Ambulatory Visit | Attending: Radiation Oncology | Admitting: Radiation Oncology

## 2024-03-28 ENCOUNTER — Inpatient Hospital Stay: Attending: Internal Medicine

## 2024-03-28 VITALS — BP 128/69 | HR 58 | Temp 98.2°F | Resp 20 | Wt 189.0 lb

## 2024-03-28 DIAGNOSIS — D701 Agranulocytosis secondary to cancer chemotherapy: Secondary | ICD-10-CM | POA: Diagnosis not present

## 2024-03-28 DIAGNOSIS — Z87891 Personal history of nicotine dependence: Secondary | ICD-10-CM | POA: Diagnosis not present

## 2024-03-28 DIAGNOSIS — C3432 Malignant neoplasm of lower lobe, left bronchus or lung: Secondary | ICD-10-CM

## 2024-03-28 DIAGNOSIS — R110 Nausea: Secondary | ICD-10-CM | POA: Diagnosis not present

## 2024-03-28 DIAGNOSIS — T451X5A Adverse effect of antineoplastic and immunosuppressive drugs, initial encounter: Secondary | ICD-10-CM | POA: Insufficient documentation

## 2024-03-28 DIAGNOSIS — Z5111 Encounter for antineoplastic chemotherapy: Secondary | ICD-10-CM | POA: Diagnosis not present

## 2024-03-28 DIAGNOSIS — Z510 Encounter for antineoplastic radiation therapy: Secondary | ICD-10-CM | POA: Diagnosis not present

## 2024-03-28 LAB — CBC WITH DIFFERENTIAL (CANCER CENTER ONLY)
Abs Immature Granulocytes: 0.01 K/uL (ref 0.00–0.07)
Basophils Absolute: 0 K/uL (ref 0.0–0.1)
Basophils Relative: 1 %
Eosinophils Absolute: 0.1 K/uL (ref 0.0–0.5)
Eosinophils Relative: 3 %
HCT: 30.8 % — ABNORMAL LOW (ref 36.0–46.0)
Hemoglobin: 10.2 g/dL — ABNORMAL LOW (ref 12.0–15.0)
Immature Granulocytes: 0 %
Lymphocytes Relative: 5 %
Lymphs Abs: 0.1 K/uL — ABNORMAL LOW (ref 0.7–4.0)
MCH: 27.1 pg (ref 26.0–34.0)
MCHC: 33.1 g/dL (ref 30.0–36.0)
MCV: 81.9 fL (ref 80.0–100.0)
Monocytes Absolute: 0.2 K/uL (ref 0.1–1.0)
Monocytes Relative: 10 %
Neutro Abs: 1.8 K/uL (ref 1.7–7.7)
Neutrophils Relative %: 81 %
Platelet Count: 185 K/uL (ref 150–400)
RBC: 3.76 MIL/uL — ABNORMAL LOW (ref 3.87–5.11)
RDW: 18.6 % — ABNORMAL HIGH (ref 11.5–15.5)
WBC Count: 2.2 K/uL — ABNORMAL LOW (ref 4.0–10.5)
nRBC: 0 % (ref 0.0–0.2)

## 2024-03-28 LAB — RAD ONC ARIA SESSION SUMMARY
Course Elapsed Days: 29
Plan Fractions Treated to Date: 21
Plan Prescribed Dose Per Fraction: 2 Gy
Plan Total Fractions Prescribed: 30
Plan Total Prescribed Dose: 60 Gy
Reference Point Dosage Given to Date: 42 Gy
Reference Point Session Dosage Given: 2 Gy
Session Number: 21

## 2024-03-28 LAB — CMP (CANCER CENTER ONLY)
ALT: 9 U/L (ref 0–44)
AST: 15 U/L (ref 15–41)
Albumin: 3.5 g/dL (ref 3.5–5.0)
Alkaline Phosphatase: 57 U/L (ref 38–126)
Anion gap: 7 (ref 5–15)
BUN: 22 mg/dL (ref 8–23)
CO2: 29 mmol/L (ref 22–32)
Calcium: 9 mg/dL (ref 8.9–10.3)
Chloride: 105 mmol/L (ref 98–111)
Creatinine: 1.21 mg/dL — ABNORMAL HIGH (ref 0.44–1.00)
GFR, Estimated: 51 mL/min — ABNORMAL LOW (ref >60)
Glucose, Bld: 171 mg/dL — ABNORMAL HIGH (ref 70–99)
Potassium: 3.5 mmol/L (ref 3.5–5.1)
Sodium: 141 mmol/L (ref 135–145)
Total Bilirubin: 0.4 mg/dL (ref 0.0–1.2)
Total Protein: 6.4 g/dL — ABNORMAL LOW (ref 6.5–8.1)

## 2024-03-28 MED ORDER — SODIUM CHLORIDE 0.9 % IV SOLN
146.6000 mg | Freq: Once | INTRAVENOUS | Status: AC
  Administered 2024-03-28: 150 mg via INTRAVENOUS
  Filled 2024-03-28: qty 15

## 2024-03-28 MED ORDER — DIPHENHYDRAMINE HCL 50 MG/ML IJ SOLN
50.0000 mg | Freq: Once | INTRAMUSCULAR | Status: AC
  Administered 2024-03-28: 50 mg via INTRAVENOUS
  Filled 2024-03-28: qty 1

## 2024-03-28 MED ORDER — SODIUM CHLORIDE 0.9 % IV SOLN
45.0000 mg/m2 | Freq: Once | INTRAVENOUS | Status: AC
  Administered 2024-03-28: 90 mg via INTRAVENOUS
  Filled 2024-03-28: qty 15

## 2024-03-28 MED ORDER — DEXAMETHASONE SODIUM PHOSPHATE 10 MG/ML IJ SOLN
10.0000 mg | Freq: Once | INTRAMUSCULAR | Status: AC
  Administered 2024-03-28: 10 mg via INTRAVENOUS
  Filled 2024-03-28: qty 1

## 2024-03-28 MED ORDER — SODIUM CHLORIDE 0.9 % IV SOLN: INTRAVENOUS | Status: DC

## 2024-03-28 MED ORDER — PALONOSETRON HCL INJECTION 0.25 MG/5ML
0.2500 mg | Freq: Once | INTRAVENOUS | Status: AC
  Administered 2024-03-28: 0.25 mg via INTRAVENOUS
  Filled 2024-03-28: qty 5

## 2024-03-28 MED ORDER — FAMOTIDINE IN NACL 20-0.9 MG/50ML-% IV SOLN
20.0000 mg | Freq: Once | INTRAVENOUS | Status: AC
  Administered 2024-03-28: 20 mg via INTRAVENOUS
  Filled 2024-03-28: qty 50

## 2024-03-28 NOTE — Progress Notes (Signed)
 Continue carboplatin  150mg  per Dr Sherrod

## 2024-03-28 NOTE — Progress Notes (Signed)
   03/28/2024  Patient ID: Kelly Gibson, female   DOB: Apr 21, 1961, 63 y.o.   MRN: [id number]  Notified patient's spouse that PAP has been approved for Breztri  ([employer]) and Tresiba /Novolog  ([employer]).  Explained shipping process and when to expect deliveries. Scheduled dm f/u for later this month.  Jon VEAR Lindau, PharmD Clinical Pharmacist [phone]

## 2024-03-29 ENCOUNTER — Telehealth: Payer: Self-pay | Admitting: Medical Oncology

## 2024-03-29 ENCOUNTER — Ambulatory Visit
Admission: RE | Admit: 2024-03-29 | Discharge: 2024-03-29 | Disposition: A | Discharge status: Home / Self Care | Source: Ambulatory Visit | Attending: Radiation Oncology

## 2024-03-29 ENCOUNTER — Other Ambulatory Visit

## 2024-03-29 ENCOUNTER — Other Ambulatory Visit: Payer: Self-pay

## 2024-03-29 DIAGNOSIS — T451X5A Adverse effect of antineoplastic and immunosuppressive drugs, initial encounter: Secondary | ICD-10-CM | POA: Diagnosis not present

## 2024-03-29 DIAGNOSIS — D701 Agranulocytosis secondary to cancer chemotherapy: Secondary | ICD-10-CM | POA: Diagnosis not present

## 2024-03-29 DIAGNOSIS — R110 Nausea: Secondary | ICD-10-CM | POA: Diagnosis not present

## 2024-03-29 DIAGNOSIS — C3432 Malignant neoplasm of lower lobe, left bronchus or lung: Secondary | ICD-10-CM | POA: Diagnosis not present

## 2024-03-29 DIAGNOSIS — Z510 Encounter for antineoplastic radiation therapy: Secondary | ICD-10-CM | POA: Diagnosis not present

## 2024-03-29 DIAGNOSIS — Z87891 Personal history of nicotine dependence: Secondary | ICD-10-CM | POA: Diagnosis not present

## 2024-03-29 DIAGNOSIS — Z5111 Encounter for antineoplastic chemotherapy: Secondary | ICD-10-CM | POA: Diagnosis not present

## 2024-03-29 LAB — RAD ONC ARIA SESSION SUMMARY
Course Elapsed Days: 30
Plan Fractions Treated to Date: 22
Plan Prescribed Dose Per Fraction: 2 Gy
Plan Total Fractions Prescribed: 30
Plan Total Prescribed Dose: 60 Gy
Reference Point Dosage Given to Date: 44 Gy
Reference Point Session Dosage Given: 2 Gy
Session Number: 22

## 2024-03-29 NOTE — Telephone Encounter (Signed)
 Husband noticed a bruise on patient's back above the waist yesterday, approximately the size of a cookie. He believes the patient may have sat back too hard in the recliner. Husband requests assessment of the area. Message sent to XRT nurse to have therapists evaluate the patient's back tomorrow prior to treatment.

## 2024-03-30 ENCOUNTER — Other Ambulatory Visit: Payer: Self-pay

## 2024-03-30 ENCOUNTER — Ambulatory Visit
Admission: RE | Admit: 2024-03-30 | Discharge: 2024-03-30 | Disposition: A | Discharge status: Home / Self Care | Source: Ambulatory Visit | Attending: Radiation Oncology | Admitting: Radiation Oncology

## 2024-03-30 DIAGNOSIS — R110 Nausea: Secondary | ICD-10-CM | POA: Diagnosis not present

## 2024-03-30 DIAGNOSIS — Z510 Encounter for antineoplastic radiation therapy: Secondary | ICD-10-CM | POA: Diagnosis not present

## 2024-03-30 DIAGNOSIS — D701 Agranulocytosis secondary to cancer chemotherapy: Secondary | ICD-10-CM | POA: Diagnosis not present

## 2024-03-30 DIAGNOSIS — T451X5A Adverse effect of antineoplastic and immunosuppressive drugs, initial encounter: Secondary | ICD-10-CM | POA: Diagnosis not present

## 2024-03-30 DIAGNOSIS — Z87891 Personal history of nicotine dependence: Secondary | ICD-10-CM | POA: Diagnosis not present

## 2024-03-30 DIAGNOSIS — C3432 Malignant neoplasm of lower lobe, left bronchus or lung: Secondary | ICD-10-CM | POA: Diagnosis not present

## 2024-03-30 DIAGNOSIS — Z5111 Encounter for antineoplastic chemotherapy: Secondary | ICD-10-CM | POA: Diagnosis not present

## 2024-03-30 LAB — RAD ONC ARIA SESSION SUMMARY
Course Elapsed Days: 31
Plan Fractions Treated to Date: 23
Plan Prescribed Dose Per Fraction: 2 Gy
Plan Total Fractions Prescribed: 30
Plan Total Prescribed Dose: 60 Gy
Reference Point Dosage Given to Date: 46 Gy
Reference Point Session Dosage Given: 2 Gy
Session Number: 23

## 2024-03-31 ENCOUNTER — Ambulatory Visit
Admission: RE | Admit: 2024-03-31 | Discharge: 2024-03-31 | Disposition: A | Discharge status: Home / Self Care | Source: Ambulatory Visit | Attending: Radiation Oncology | Admitting: Radiation Oncology

## 2024-03-31 ENCOUNTER — Other Ambulatory Visit: Payer: Self-pay

## 2024-03-31 DIAGNOSIS — T451X5A Adverse effect of antineoplastic and immunosuppressive drugs, initial encounter: Secondary | ICD-10-CM | POA: Diagnosis not present

## 2024-03-31 DIAGNOSIS — Z5111 Encounter for antineoplastic chemotherapy: Secondary | ICD-10-CM | POA: Diagnosis not present

## 2024-03-31 DIAGNOSIS — Z87891 Personal history of nicotine dependence: Secondary | ICD-10-CM | POA: Diagnosis not present

## 2024-03-31 DIAGNOSIS — D701 Agranulocytosis secondary to cancer chemotherapy: Secondary | ICD-10-CM | POA: Diagnosis not present

## 2024-03-31 DIAGNOSIS — R110 Nausea: Secondary | ICD-10-CM | POA: Diagnosis not present

## 2024-03-31 DIAGNOSIS — Z510 Encounter for antineoplastic radiation therapy: Secondary | ICD-10-CM | POA: Diagnosis not present

## 2024-03-31 DIAGNOSIS — C3432 Malignant neoplasm of lower lobe, left bronchus or lung: Secondary | ICD-10-CM | POA: Diagnosis not present

## 2024-03-31 LAB — RAD ONC ARIA SESSION SUMMARY
Course Elapsed Days: 32
Plan Fractions Treated to Date: 24
Plan Prescribed Dose Per Fraction: 2 Gy
Plan Total Fractions Prescribed: 30
Plan Total Prescribed Dose: 60 Gy
Reference Point Dosage Given to Date: 48 Gy
Reference Point Session Dosage Given: 2 Gy
Session Number: 24

## 2024-04-03 ENCOUNTER — Ambulatory Visit
Admission: RE | Admit: 2024-04-03 | Discharge: 2024-04-03 | Disposition: A | Discharge status: Home / Self Care | Source: Ambulatory Visit | Attending: Radiation Oncology | Admitting: Radiation Oncology

## 2024-04-03 ENCOUNTER — Other Ambulatory Visit: Payer: Self-pay

## 2024-04-03 DIAGNOSIS — T451X5A Adverse effect of antineoplastic and immunosuppressive drugs, initial encounter: Secondary | ICD-10-CM | POA: Diagnosis not present

## 2024-04-03 DIAGNOSIS — Z87891 Personal history of nicotine dependence: Secondary | ICD-10-CM | POA: Diagnosis not present

## 2024-04-03 DIAGNOSIS — Z510 Encounter for antineoplastic radiation therapy: Secondary | ICD-10-CM | POA: Diagnosis not present

## 2024-04-03 DIAGNOSIS — R110 Nausea: Secondary | ICD-10-CM | POA: Diagnosis not present

## 2024-04-03 DIAGNOSIS — C3432 Malignant neoplasm of lower lobe, left bronchus or lung: Secondary | ICD-10-CM | POA: Diagnosis not present

## 2024-04-03 DIAGNOSIS — Z5111 Encounter for antineoplastic chemotherapy: Secondary | ICD-10-CM | POA: Diagnosis not present

## 2024-04-03 DIAGNOSIS — D701 Agranulocytosis secondary to cancer chemotherapy: Secondary | ICD-10-CM | POA: Diagnosis not present

## 2024-04-03 LAB — RAD ONC ARIA SESSION SUMMARY
Course Elapsed Days: 35
Plan Fractions Treated to Date: 25
Plan Prescribed Dose Per Fraction: 2 Gy
Plan Total Fractions Prescribed: 30
Plan Total Prescribed Dose: 60 Gy
Reference Point Dosage Given to Date: 50 Gy
Reference Point Session Dosage Given: 2 Gy
Session Number: 25

## 2024-04-04 ENCOUNTER — Inpatient Hospital Stay (HOSPITAL_BASED_OUTPATIENT_CLINIC_OR_DEPARTMENT_OTHER): Admitting: Internal Medicine

## 2024-04-04 ENCOUNTER — Other Ambulatory Visit: Payer: Self-pay

## 2024-04-04 ENCOUNTER — Inpatient Hospital Stay

## 2024-04-04 ENCOUNTER — Ambulatory Visit
Admission: RE | Admit: 2024-04-04 | Discharge: 2024-04-04 | Disposition: A | Discharge status: Home / Self Care | Source: Ambulatory Visit | Attending: Radiation Oncology | Admitting: Radiation Oncology

## 2024-04-04 ENCOUNTER — Other Ambulatory Visit

## 2024-04-04 VITALS — BP 107/74 | HR 82 | Temp 97.3°F | Resp 17 | Wt 185.8 lb

## 2024-04-04 DIAGNOSIS — D701 Agranulocytosis secondary to cancer chemotherapy: Secondary | ICD-10-CM | POA: Diagnosis not present

## 2024-04-04 DIAGNOSIS — C3432 Malignant neoplasm of lower lobe, left bronchus or lung: Secondary | ICD-10-CM | POA: Diagnosis not present

## 2024-04-04 DIAGNOSIS — Z87891 Personal history of nicotine dependence: Secondary | ICD-10-CM | POA: Diagnosis not present

## 2024-04-04 DIAGNOSIS — R110 Nausea: Secondary | ICD-10-CM | POA: Diagnosis not present

## 2024-04-04 DIAGNOSIS — Z510 Encounter for antineoplastic radiation therapy: Secondary | ICD-10-CM | POA: Diagnosis not present

## 2024-04-04 DIAGNOSIS — C3490 Malignant neoplasm of unspecified part of unspecified bronchus or lung: Secondary | ICD-10-CM | POA: Diagnosis not present

## 2024-04-04 DIAGNOSIS — Z5111 Encounter for antineoplastic chemotherapy: Secondary | ICD-10-CM | POA: Diagnosis not present

## 2024-04-04 DIAGNOSIS — T451X5A Adverse effect of antineoplastic and immunosuppressive drugs, initial encounter: Secondary | ICD-10-CM | POA: Diagnosis not present

## 2024-04-04 LAB — RAD ONC ARIA SESSION SUMMARY
Course Elapsed Days: 36
Plan Fractions Treated to Date: 26
Plan Prescribed Dose Per Fraction: 2 Gy
Plan Total Fractions Prescribed: 30
Plan Total Prescribed Dose: 60 Gy
Reference Point Dosage Given to Date: 52 Gy
Reference Point Session Dosage Given: 2 Gy
Session Number: 26

## 2024-04-04 LAB — CBC WITH DIFFERENTIAL (CANCER CENTER ONLY)
Abs Immature Granulocytes: 0.01 K/uL (ref 0.00–0.07)
Basophils Absolute: 0 K/uL (ref 0.0–0.1)
Basophils Relative: 1 %
Eosinophils Absolute: 0.1 K/uL (ref 0.0–0.5)
Eosinophils Relative: 3 %
HCT: 30.3 % — ABNORMAL LOW (ref 36.0–46.0)
Hemoglobin: 10 g/dL — ABNORMAL LOW (ref 12.0–15.0)
Immature Granulocytes: 1 %
Lymphocytes Relative: 4 %
Lymphs Abs: 0.1 K/uL — ABNORMAL LOW (ref 0.7–4.0)
MCH: 26.9 pg (ref 26.0–34.0)
MCHC: 33 g/dL (ref 30.0–36.0)
MCV: 81.5 fL (ref 80.0–100.0)
Monocytes Absolute: 0.2 K/uL (ref 0.1–1.0)
Monocytes Relative: 11 %
Neutro Abs: 1.7 K/uL (ref 1.7–7.7)
Neutrophils Relative %: 80 %
Platelet Count: 196 K/uL (ref 150–400)
RBC: 3.72 MIL/uL — ABNORMAL LOW (ref 3.87–5.11)
RDW: 18.9 % — ABNORMAL HIGH (ref 11.5–15.5)
WBC Count: 2.1 K/uL — ABNORMAL LOW (ref 4.0–10.5)
nRBC: 0 % (ref 0.0–0.2)

## 2024-04-04 LAB — CMP (CANCER CENTER ONLY)
ALT: 10 U/L (ref 0–44)
AST: 15 U/L (ref 15–41)
Albumin: 3.7 g/dL (ref 3.5–5.0)
Alkaline Phosphatase: 57 U/L (ref 38–126)
Anion gap: 5 (ref 5–15)
BUN: 23 mg/dL (ref 8–23)
CO2: 30 mmol/L (ref 22–32)
Calcium: 9 mg/dL (ref 8.9–10.3)
Chloride: 106 mmol/L (ref 98–111)
Creatinine: 1.16 mg/dL — ABNORMAL HIGH (ref 0.44–1.00)
GFR, Estimated: 54 mL/min — ABNORMAL LOW (ref >60)
Glucose, Bld: 111 mg/dL — ABNORMAL HIGH (ref 70–99)
Potassium: 3.6 mmol/L (ref 3.5–5.1)
Sodium: 141 mmol/L (ref 135–145)
Total Bilirubin: 0.5 mg/dL (ref 0.0–1.2)
Total Protein: 6.7 g/dL (ref 6.5–8.1)

## 2024-04-04 MED ORDER — DEXAMETHASONE SODIUM PHOSPHATE 10 MG/ML IJ SOLN
10.0000 mg | Freq: Once | INTRAMUSCULAR | Status: AC
  Administered 2024-04-04: 10 mg via INTRAVENOUS
  Filled 2024-04-04: qty 1

## 2024-04-04 MED ORDER — SODIUM CHLORIDE 0.9 % IV SOLN: INTRAVENOUS | Status: DC

## 2024-04-04 MED ORDER — FAMOTIDINE IN NACL 20-0.9 MG/50ML-% IV SOLN
20.0000 mg | Freq: Once | INTRAVENOUS | Status: AC
  Administered 2024-04-04: 20 mg via INTRAVENOUS
  Filled 2024-04-04: qty 50

## 2024-04-04 MED ORDER — DIPHENHYDRAMINE HCL 50 MG/ML IJ SOLN
50.0000 mg | Freq: Once | INTRAMUSCULAR | Status: AC
  Administered 2024-04-04: 50 mg via INTRAVENOUS
  Filled 2024-04-04: qty 1

## 2024-04-04 MED ORDER — SODIUM CHLORIDE 0.9 % IV SOLN
150.0000 mg | Freq: Once | INTRAVENOUS | Status: AC
  Administered 2024-04-04: 150 mg via INTRAVENOUS
  Filled 2024-04-04: qty 15

## 2024-04-04 MED ORDER — PALONOSETRON HCL INJECTION 0.25 MG/5ML
0.2500 mg | Freq: Once | INTRAVENOUS | Status: AC
  Administered 2024-04-04: 0.25 mg via INTRAVENOUS
  Filled 2024-04-04: qty 5

## 2024-04-04 MED ORDER — SODIUM CHLORIDE 0.9 % IV SOLN
45.0000 mg/m2 | Freq: Once | INTRAVENOUS | Status: AC
  Administered 2024-04-04: 90 mg via INTRAVENOUS
  Filled 2024-04-04: qty 15

## 2024-04-04 NOTE — Patient Instructions (Signed)
 [facility] - A DEPT OF [facility]  Discharge Instructions: Thank you for choosing [facility] to provide your oncology and hematology care.   If you have a lab appointment with the Cancer Center, please go directly to the Cancer Center and check in at the registration area.   Wear comfortable clothing and clothing appropriate for easy access to any Portacath or PICC line.   We strive to give you quality time with your provider. You may need to reschedule your appointment if you arrive late (15 or more minutes).  Arriving late affects you and other patients whose appointments are after yours.  Also, if you miss three or more appointments without notifying the office, you may be dismissed from the clinic at the provider's discretion.      For prescription refill requests, have your pharmacy contact our office and allow 72 hours for refills to be completed.    Today you received the following chemotherapy and/or immunotherapy agents: Carboplatin , paclitaxel      To help prevent nausea and vomiting after your treatment, we encourage you to take your nausea medication as directed.  BELOW ARE SYMPTOMS THAT SHOULD BE REPORTED IMMEDIATELY: *FEVER GREATER THAN 100.4 F (38 C) OR HIGHER *CHILLS OR SWEATING *NAUSEA AND VOMITING THAT IS NOT CONTROLLED WITH YOUR NAUSEA MEDICATION *UNUSUAL SHORTNESS OF BREATH *UNUSUAL BRUISING OR BLEEDING *URINARY PROBLEMS (pain or burning when urinating, or frequent urination) *BOWEL PROBLEMS (unusual diarrhea, constipation, pain near the anus) TENDERNESS IN MOUTH AND THROAT WITH OR WITHOUT PRESENCE OF ULCERS (sore throat, sores in mouth, or a toothache) UNUSUAL RASH, SWELLING OR PAIN  UNUSUAL VAGINAL DISCHARGE OR ITCHING   Items with * indicate a potential emergency and should be followed up as soon as possible or go to the Emergency Department if any problems should occur.  Please show the CHEMOTHERAPY ALERT CARD or  IMMUNOTHERAPY ALERT CARD at check-in to the Emergency Department and triage nurse.  Should you have questions after your visit or need to cancel or reschedule your appointment, please contact [facility]  Dept: [phone]  and follow the prompts.  Office hours are 8:00 a.m. to 4:30 p.m. Monday - Friday. Please note that voicemails left after 4:00 p.m. may not be returned until the following business day.  We are closed weekends and major holidays. You have access to a nurse at all times for urgent questions. Please call the main number to the clinic Dept: [phone] and follow the prompts.   For any non-urgent questions, you may also contact your provider using MyChart. We now offer e-Visits for anyone 41 and older to request care online for non-urgent symptoms. For details visit [email or web address].   Also download the MyChart app! Go to the app store, search MyChart, open the app, select [place], and log in with your MyChart username and password.

## 2024-04-04 NOTE — Progress Notes (Signed)
 [facility] [phone]   [phone]  OFFICE PROGRESS NOTE  Early, [name], NP [address] [place] [address]  DIAGNOSIS: Stage IIIC (T3, N3, M0) NSCLC, adenocarcinoma. She presented with left lower lobe mass and intermittent activity with nonenlarged mediastinal lymph nodes.  She was diagnosed in June 2025.  Biomarker Findings HRD signature - Cannot Be Determined Microsatellite status - Cannot Be Determined ? Tumor Mutational Burden - Cannot Be Determined Genomic Findings For a complete list of the genes assayed, please refer to the Appendix. KRAS G12V 7 Disease relevant genes with no reportable alterations: ALK, BRAF, EGFR, ERBB2, MET, RET, ROS1     PRIOR THERAPY: None   CURRENT THERAPY: Concurrent chemoradiation with weekly carboplatin  for an AUC of 2 and paclitaxel  45 mg/m.  First dose on 02/28/2024.  Status post 5 cycles.  INTERVAL HISTORY: Kelly WESTHOFF 63 y.o. female returns to the clinic today for follow-up visit accompanied by her husband.Discussed the use of AI scribe software for clinical note transcription with the patient, who gave verbal consent to proceed.  History of Present Illness Kelly Gibson is a 63 year old female with stage III C non-small cell lung cancer who presents for evaluation before starting cycle six of her treatment. She is accompanied by her husband.  She was diagnosed with stage III C non-small cell lung cancer, adenocarcinoma, in June 2025 and is currently undergoing concurrent chemoradiation. She has completed five cycles of weekly carboplatin  and paclitaxel .  She experiences nausea, particularly towards the end of the day, which is sometimes alleviated by anti-nausea medication. Her husband notes that the nausea is absent in the morning but appears suddenly later in the day.  She has developed a significant burn on her back.      MEDICAL HISTORY: Past Medical History:   Diagnosis Date   Bradycardia    SR/SB with up to 3.5 second pause (asymptomatic) on ~ 10/2017 event monitor; saw EP Dr. Kelsie: avoid AV nodal blocking agents   Chronic kidney disease    COPD (chronic obstructive pulmonary disease) (HCC)    Depression    Depression with anxiety 08/29/2021   Diabetes mellitus    Dysarthria, post-stroke    Dysrhythmia    Ganglion cyst 03/09/2012   Heart block AV complete (HCC) 12/11/2023   History of kidney stones    Hyperlipidemia    Hypertension    Hypokalemia    Myocardial infarction [facility])    NSTEMI   Non-ST elevation (NSTEMI) myocardial infarction (HCC) 12/11/2023   Obesity, unspecified 12/17/2011   Pneumonia    Pneumonia of left lower lobe due to infectious organism 12/12/2023   PONV (postoperative nausea and vomiting)    Skin lesion of face 11/17/2022   Stroke (HCC)    2019   right side hemiparesis    ALLERGIES:  is allergic to latex and niacin.  MEDICATIONS:  Current Outpatient Medications  Medication Sig Dispense Refill   albuterol  (PROVENTIL ) (2.5 MG/3ML) 0.083% nebulizer solution Inhale 3 mLs (2.5 mg total) into the lungs every 6 (six) hours as needed for wheezing or shortness of breath. 75 mL 12   albuterol  (VENTOLIN  HFA) 108 (90 Base) MCG/ACT inhaler Inhale 2 puffs into the lungs every 6 (six) hours as needed for wheezing or shortness of breath. 18 g 2   amLODipine  (NORVASC ) 5 MG tablet Take 5 mg by mouth daily.     apixaban  (ELIQUIS ) 5 MG TABS tablet Take 1 tablet (  5 mg total) by mouth 2 (two) times daily. 60 tablet 3   aspirin  EC 81 MG tablet Take 1 tablet (81 mg total) by mouth daily. Swallow whole. 30 tablet 12   budesonide -glycopyrrolate -formoterol  (BREZTRI  AEROSPHERE) 160-9-4.8 MCG/ACT AERO inhaler Inhale 2 puffs into the lungs 2 (two) times daily. 10.7 g 1   cefadroxil  (DURICEF) 500 MG capsule Take 1 capsule (500 mg total) by mouth daily. 5 capsule 0   citalopram  (CELEXA ) 10 MG tablet TAKE 1 TABLET BY MOUTH DAILY 90 tablet 1    Continuous Glucose Sensor (DEXCOM G7 SENSOR) MISC Take 1 Device by mouth See admin instructions. Change sensor every 10 days     Doxylamine  Succinate, Sleep, (UNISOM  PO) Take 2 each by mouth at bedtime. Gel capsules     furosemide  (LASIX ) 20 MG tablet Take 1 tablet (20 mg total) by mouth daily as needed for fluid or edema (take if daily weight is greater than 2 lbs from the previous day's weight.). 90 tablet 0   HUMALOG  KWIKPEN 100 UNIT/ML KwikPen INJECT 3 TO 5 UNITS INTO THE SKIN DAILY BEFORE LUNCH 6 mL 3   hydrALAZINE  (APRESOLINE ) 100 MG tablet 1 tablet up to every 12 hours only as needed for blood pressure >150/90     insulin  degludec (TRESIBA  FLEXTOUCH) 100 UNIT/ML FlexTouch Pen Inject 10 Units into the skin 2 (two) times daily. Adjust by 2 units every 3 days as needed for blood sugars higher than 150 in the morning fasting. 9 mL 11   lidocaine -prilocaine  (EMLA ) cream Apply to the Port-A-Cath site 30-60 minutes before treatment 30 g 1   Melatonin 5 MG CHEW Chew 10 mg by mouth at bedtime.     Multiple Vitamins-Minerals (MULTIVITAMIN GUMMIES WOMENS) CHEW Chew 2 each by mouth in the morning.     olmesartan (BENICAR) 40 MG tablet Take 40 mg by mouth in the morning.     prochlorperazine  (COMPAZINE ) 10 MG tablet Take 1 tablet (10 mg total) by mouth every 6 (six) hours as needed. 30 tablet 2   rosuvastatin  (CRESTOR ) 40 MG tablet TAKE 1 TABLET BY MOUTH DAILY 90 tablet 3   sucralfate  (CARAFATE ) 1 g tablet Take 1 tablet (1 g total) by mouth 4 (four) times daily -  with meals and at bedtime. 120 tablet 1   UNABLE TO FIND Take 1 tablet by mouth with breakfast, with lunch, and with evening meal. GlucoGold -Berberine, Concentrated Cinnamon, Chromium, Banaba Leaf Extract     No current facility-administered medications for this visit.    SURGICAL HISTORY:  Past Surgical History:  Procedure Laterality Date   CESAREAN SECTION     x 3   IR IMAGING GUIDED PORT INSERTION  03/09/2024   LEFT HEART CATH AND  CORONARY ANGIOGRAPHY N/A 12/28/2023   Procedure: LEFT HEART CATH AND CORONARY ANGIOGRAPHY;  Surgeon: Elmira Newman PARAS, MD;  Location: [facility] INVASIVE CV LAB;  Service: Cardiovascular;  Laterality: N/A;   ORIF WRIST FRACTURE Right 05/04/2018   Procedure: OPEN REDUCTION INTERNAL FIXATION (ORIF) RIGHT  WRIST FRACTURE;  Surgeon: Cristy Bonner DASEN, MD;  Location: [facility] OR;  Service: Orthopedics;  Laterality: Right;   TUMOR REMOVAL     left shoulder   VIDEO BRONCHOSCOPY WITH ENDOBRONCHIAL NAVIGATION  01/03/2024   Procedure: VIDEO BRONCHOSCOPY WITH ENDOBRONCHIAL NAVIGATION;  Surgeon: Shelah Lamar RAMAN, MD;  Location: [facility] ENDOSCOPY;  Service: Pulmonary;;   VIDEO BRONCHOSCOPY WITH ENDOBRONCHIAL ULTRASOUND Bilateral 01/03/2024   Procedure: BRONCHOSCOPY, WITH EBUS;  Surgeon: Shelah Lamar RAMAN, MD;  Location: [facility]  ENDOSCOPY;  Service: Pulmonary;  Laterality: Bilateral;    REVIEW OF SYSTEMS:  A comprehensive review of systems was negative except for: Constitutional: positive for fatigue Gastrointestinal: positive for nausea Integument/breast: positive for skin lesion(s)   PHYSICAL EXAMINATION: General appearance: alert, cooperative, fatigued, and no distress Head: Normocephalic, without obvious abnormality, atraumatic Neck: no adenopathy, no JVD, supple, symmetrical, trachea midline, and thyroid not enlarged, symmetric, no tenderness/mass/nodules Lymph nodes: Cervical, supraclavicular, and axillary nodes normal. Resp: clear to auscultation bilaterally Back: symmetric, no curvature. ROM normal. No CVA tenderness. Cardio: regular rate and rhythm, S1, S2 normal, no murmur, click, rub or gallop GI: soft, non-tender; bowel sounds normal; no masses,  no organomegaly Extremities: extremities normal, atraumatic, no cyanosis or edema  ECOG PERFORMANCE STATUS: 1 - Symptomatic but completely ambulatory  Blood pressure 107/74, pulse 82, temperature (!) 97.3 F (36.3 C), resp. rate 17, weight 185 lb 12.8 oz (84.3 kg), last menstrual  period 12/10/2012, SpO2 100%.  LABORATORY DATA: Lab Results  Component Value Date   WBC 2.1 (L) 04/04/2024   HGB 10.0 (L) 04/04/2024   HCT 30.3 (L) 04/04/2024   MCV 81.5 04/04/2024   PLT 196 04/04/2024      Chemistry      Component Value Date/Time   NA 141 03/28/2024 1334   NA 144 01/10/2024 1508   K 3.5 03/28/2024 1334   CL 105 03/28/2024 1334   CO2 29 03/28/2024 1334   BUN 22 03/28/2024 1334   BUN 27 01/10/2024 1508   CREATININE 1.21 (H) 03/28/2024 1334   CREATININE 1.12 (H) 05/22/2020 1211      Component Value Date/Time   CALCIUM  9.0 03/28/2024 1334   CALCIUM  10.0 08/18/2023 0000   ALKPHOS 57 03/28/2024 1334   AST 15 03/28/2024 1334   ALT 9 03/28/2024 1334   BILITOT 0.4 03/28/2024 1334       RADIOGRAPHIC STUDIES: IR IMAGING GUIDED PORT INSERTION Result Date: 03/09/2024 CLINICAL DATA:  Left lower lobe lung carcinoma and need for porta cath for chemotherapy. EXAM: IMPLANTED PORT A CATH PLACEMENT WITH ULTRASOUND AND FLUOROSCOPIC GUIDANCE ANESTHESIA/SEDATION: Moderate (conscious) sedation was employed during this procedure. A total of Versed  1.5 mg and Fentanyl  50 mcg was administered intravenously. Moderate Sedation Time: 38 minutes. The patient's level of consciousness and vital signs were monitored continuously by radiology nursing throughout the procedure under my direct supervision. FLUOROSCOPY: Radiation Exposure Index: 6.0 mGy Kerma PROCEDURE: The procedure, risks, benefits, and alternatives were explained to the patient. Questions regarding the procedure were encouraged and answered. The patient understands and consents to the procedure. A time-out was performed prior to initiating the procedure. Ultrasound was utilized to confirm patency of the right internal jugular vein. An ultrasound image was saved and recorded. The right neck and chest were prepped with chlorhexidine  in a sterile fashion, and a sterile drape was applied covering the operative field. Maximum barrier  sterile technique with sterile gowns and gloves were used for the procedure. Local anesthesia was provided with 1% lidocaine . After creating a small venotomy incision, a 21 gauge needle was advanced into the right internal jugular vein under direct, real-time ultrasound guidance. Ultrasound image documentation was performed. After securing guidewire access, an 8 Fr dilator was placed. A J-wire was kinked to measure appropriate catheter length. A subcutaneous port pocket was then created along the upper chest wall utilizing sharp and blunt dissection. Portable cautery was utilized. The pocket was irrigated with sterile saline. A single lumen power injectable port was chosen for placement. The 8  Fr catheter was tunneled from the port pocket site to the venotomy incision. The port was placed in the pocket. External catheter was trimmed to appropriate length based on guidewire measurement. At the venotomy, an 8 Fr peel-away sheath was placed over a guidewire. The catheter was then placed through the sheath and the sheath removed. Final catheter positioning was confirmed and documented with a fluoroscopic spot image. The port was accessed with a needle and aspirated and flushed with heparinized saline. The access needle was removed. The venotomy and port pocket incisions were closed with subcutaneous 3-0 Monocryl and subcuticular 4-0 Vicryl. Dermabond was applied to both incisions. COMPLICATIONS: COMPLICATIONS None FINDINGS: After catheter placement, the tip lies at the cavo-atrial junction. The catheter aspirates normally and is ready for immediate use. IMPRESSION: Placement of single lumen port a cath via right internal jugular vein. The catheter tip lies at the cavo-atrial junction. A power injectable port a cath was placed and is ready for immediate use. Electronically Signed   By: Marcey Moan M.D.   On: 03/09/2024 15:06    ASSESSMENT AND PLAN: This is a very pleasant 63 years old African-American female with  Stage IIIC (T3, N3, M0) NSCLC, adenocarcinoma. She presented with left lower lobe mass and intermittent activity with nonenlarged mediastinal lymph nodes.  She was diagnosed in June 2025. Molecular studies showed no actionable mutations. The patient is currently undergoing a course of concurrent chemoradiation with weekly carboplatin  for AUC of 2 and paclitaxel  45 Mg/M2 status post 5 cycles. The patient has been tolerating her treatment fairly well except for occasional nausea and skin burns from radiation. Assessment and Plan Assessment & Plan Stage IIIC non-small cell lung cancer, adenocarcinoma, actively treated with concurrent chemoradiation Currently undergoing concurrent chemoradiation with weekly carboplatin  and paclitaxel . She has completed five cycles and is here for evaluation before starting cycle six. Radiation therapy is scheduled to complete on April 10, 2024. The decision was made to discontinue chemotherapy after today's session, as she will have completed her radiation therapy by next week. - Administer last cycle of chemotherapy today - Discontinue chemotherapy after today's session - Complete radiation therapy by April 10, 2024 - Order chest CT scan in one month to evaluate treatment efficacy - Schedule follow-up appointment one week after chest CT scan to discuss results  Chemotherapy-induced nausea Experiencing nausea, particularly towards the end of the day, which is a common side effect of chemotherapy. She has been using anti-nausea medication with variable effectiveness. - Administer anti-nausea medication in the infusion room - Continue current anti-nausea medication regimen  Leukopenia secondary to chemotherapy White blood cell count is slightly low but within acceptable range for treatment continuation. She will have a month to recover post-chemotherapy.  Follow-Up Follow-up plan discussed to monitor treatment efficacy and manage side effects. - Schedule  follow-up appointment in approximately five weeks - Ensure chest CT scan is completed one week prior to follow-up appointment She was advised to call immediately if she has any concerning symptoms in the interval.  The patient voices understanding of current disease status and treatment options and is in agreement with the current care plan.  All questions were answered. The patient knows to call the clinic with any problems, questions or concerns. We can certainly see the patient much sooner if necessary.  The total time spent in the appointment was 20 minutes including review of chart and various tests results, discussions about plan of care and coordination of care plan .   Disclaimer: This note  was dictated with voice recognition software. Similar sounding words can inadvertently be transcribed and may not be corrected upon review.

## 2024-04-05 ENCOUNTER — Ambulatory Visit
Admission: RE | Admit: 2024-04-05 | Discharge: 2024-04-05 | Disposition: A | Discharge status: Home / Self Care | Source: Ambulatory Visit | Attending: Radiation Oncology

## 2024-04-05 ENCOUNTER — Other Ambulatory Visit: Payer: Self-pay

## 2024-04-05 DIAGNOSIS — Z5111 Encounter for antineoplastic chemotherapy: Secondary | ICD-10-CM | POA: Diagnosis not present

## 2024-04-05 DIAGNOSIS — D701 Agranulocytosis secondary to cancer chemotherapy: Secondary | ICD-10-CM | POA: Diagnosis not present

## 2024-04-05 DIAGNOSIS — Z510 Encounter for antineoplastic radiation therapy: Secondary | ICD-10-CM | POA: Diagnosis not present

## 2024-04-05 DIAGNOSIS — T451X5A Adverse effect of antineoplastic and immunosuppressive drugs, initial encounter: Secondary | ICD-10-CM | POA: Diagnosis not present

## 2024-04-05 DIAGNOSIS — Z87891 Personal history of nicotine dependence: Secondary | ICD-10-CM | POA: Diagnosis not present

## 2024-04-05 DIAGNOSIS — R110 Nausea: Secondary | ICD-10-CM | POA: Diagnosis not present

## 2024-04-05 DIAGNOSIS — C3432 Malignant neoplasm of lower lobe, left bronchus or lung: Secondary | ICD-10-CM | POA: Diagnosis not present

## 2024-04-05 LAB — RAD ONC ARIA SESSION SUMMARY
Course Elapsed Days: 37
Plan Fractions Treated to Date: 27
Plan Prescribed Dose Per Fraction: 2 Gy
Plan Total Fractions Prescribed: 30
Plan Total Prescribed Dose: 60 Gy
Reference Point Dosage Given to Date: 54 Gy
Reference Point Session Dosage Given: 2 Gy
Session Number: 27

## 2024-04-06 ENCOUNTER — Ambulatory Visit
Admission: RE | Admit: 2024-04-06 | Discharge: 2024-04-06 | Disposition: A | Discharge status: Home / Self Care | Source: Ambulatory Visit | Attending: Radiation Oncology | Admitting: Radiation Oncology

## 2024-04-06 ENCOUNTER — Other Ambulatory Visit: Payer: Self-pay

## 2024-04-06 DIAGNOSIS — Z510 Encounter for antineoplastic radiation therapy: Secondary | ICD-10-CM | POA: Diagnosis not present

## 2024-04-06 DIAGNOSIS — Z5111 Encounter for antineoplastic chemotherapy: Secondary | ICD-10-CM | POA: Diagnosis not present

## 2024-04-06 DIAGNOSIS — T451X5A Adverse effect of antineoplastic and immunosuppressive drugs, initial encounter: Secondary | ICD-10-CM | POA: Diagnosis not present

## 2024-04-06 DIAGNOSIS — R110 Nausea: Secondary | ICD-10-CM | POA: Diagnosis not present

## 2024-04-06 DIAGNOSIS — D701 Agranulocytosis secondary to cancer chemotherapy: Secondary | ICD-10-CM | POA: Diagnosis not present

## 2024-04-06 DIAGNOSIS — C3432 Malignant neoplasm of lower lobe, left bronchus or lung: Secondary | ICD-10-CM | POA: Diagnosis not present

## 2024-04-06 DIAGNOSIS — Z87891 Personal history of nicotine dependence: Secondary | ICD-10-CM | POA: Diagnosis not present

## 2024-04-06 LAB — RAD ONC ARIA SESSION SUMMARY
Course Elapsed Days: 38
Plan Fractions Treated to Date: 28
Plan Prescribed Dose Per Fraction: 2 Gy
Plan Total Fractions Prescribed: 30
Plan Total Prescribed Dose: 60 Gy
Reference Point Dosage Given to Date: 56 Gy
Reference Point Session Dosage Given: 2 Gy
Session Number: 28

## 2024-04-07 ENCOUNTER — Other Ambulatory Visit: Payer: Self-pay

## 2024-04-07 ENCOUNTER — Ambulatory Visit
Admission: RE | Admit: 2024-04-07 | Discharge: 2024-04-07 | Disposition: A | Discharge status: Home / Self Care | Source: Ambulatory Visit | Attending: Radiation Oncology | Admitting: Radiation Oncology

## 2024-04-07 ENCOUNTER — Other Ambulatory Visit: Payer: Self-pay | Admitting: Medical Oncology

## 2024-04-07 DIAGNOSIS — C3432 Malignant neoplasm of lower lobe, left bronchus or lung: Secondary | ICD-10-CM

## 2024-04-07 DIAGNOSIS — Z510 Encounter for antineoplastic radiation therapy: Secondary | ICD-10-CM | POA: Diagnosis not present

## 2024-04-07 DIAGNOSIS — T451X5A Adverse effect of antineoplastic and immunosuppressive drugs, initial encounter: Secondary | ICD-10-CM | POA: Diagnosis not present

## 2024-04-07 DIAGNOSIS — Z5111 Encounter for antineoplastic chemotherapy: Secondary | ICD-10-CM | POA: Diagnosis not present

## 2024-04-07 DIAGNOSIS — Z87891 Personal history of nicotine dependence: Secondary | ICD-10-CM | POA: Diagnosis not present

## 2024-04-07 DIAGNOSIS — R110 Nausea: Secondary | ICD-10-CM | POA: Diagnosis not present

## 2024-04-07 DIAGNOSIS — D701 Agranulocytosis secondary to cancer chemotherapy: Secondary | ICD-10-CM | POA: Diagnosis not present

## 2024-04-07 LAB — RAD ONC ARIA SESSION SUMMARY
Course Elapsed Days: 39
Plan Fractions Treated to Date: 29
Plan Prescribed Dose Per Fraction: 2 Gy
Plan Total Fractions Prescribed: 30
Plan Total Prescribed Dose: 60 Gy
Reference Point Dosage Given to Date: 58 Gy
Reference Point Session Dosage Given: 2 Gy
Session Number: 29

## 2024-04-10 ENCOUNTER — Ambulatory Visit
Admission: RE | Admit: 2024-04-10 | Discharge: 2024-04-10 | Disposition: A | Discharge status: Home / Self Care | Source: Ambulatory Visit | Attending: Radiation Oncology | Admitting: Radiation Oncology

## 2024-04-10 ENCOUNTER — Other Ambulatory Visit: Payer: Self-pay

## 2024-04-10 DIAGNOSIS — R110 Nausea: Secondary | ICD-10-CM | POA: Diagnosis not present

## 2024-04-10 DIAGNOSIS — D701 Agranulocytosis secondary to cancer chemotherapy: Secondary | ICD-10-CM | POA: Diagnosis not present

## 2024-04-10 DIAGNOSIS — Z5111 Encounter for antineoplastic chemotherapy: Secondary | ICD-10-CM | POA: Diagnosis not present

## 2024-04-10 DIAGNOSIS — Z510 Encounter for antineoplastic radiation therapy: Secondary | ICD-10-CM | POA: Diagnosis not present

## 2024-04-10 DIAGNOSIS — Z87891 Personal history of nicotine dependence: Secondary | ICD-10-CM | POA: Diagnosis not present

## 2024-04-10 DIAGNOSIS — T451X5A Adverse effect of antineoplastic and immunosuppressive drugs, initial encounter: Secondary | ICD-10-CM | POA: Diagnosis not present

## 2024-04-10 DIAGNOSIS — C3432 Malignant neoplasm of lower lobe, left bronchus or lung: Secondary | ICD-10-CM | POA: Diagnosis not present

## 2024-04-10 LAB — RAD ONC ARIA SESSION SUMMARY
Course Elapsed Days: 42
Plan Fractions Treated to Date: 30
Plan Prescribed Dose Per Fraction: 2 Gy
Plan Total Fractions Prescribed: 30
Plan Total Prescribed Dose: 60 Gy
Reference Point Dosage Given to Date: 60 Gy
Reference Point Session Dosage Given: 2 Gy
Session Number: 30

## 2024-04-11 ENCOUNTER — Ambulatory Visit

## 2024-04-11 ENCOUNTER — Inpatient Hospital Stay

## 2024-04-11 ENCOUNTER — Other Ambulatory Visit

## 2024-04-11 NOTE — Radiation Completion Notes (Addendum)
  Radiation Oncology         [phone] ________________________________  Name: Kelly Gibson MRN: [id number]  Date of Service: 04/10/2024  DOB: 16-May-1961  End of Treatment Note    Diagnosis: Stage IIIC, cT3N3M0, NSCLC, adenocarcinoma of the LLL   Intent: Curative     ==========DELIVERED PLANS==========  First Treatment Date: 2024-02-28 Last Treatment Date: 2024-04-10   Plan Name: Lung_L Site: Lung, Left Technique: 3D Mode: Photon Dose Per Fraction: 2 Gy Prescribed Dose (Delivered / Prescribed): 60 Gy / 60 Gy Prescribed Fxs (Delivered / Prescribed): 30 / 30     ==========ON TREATMENT VISIT DATES========== 2024-03-03, 2024-03-10, 2024-03-17, 2024-03-24, 2024-03-31, 2024-04-07    See weekly On Treatment Notes in Epic for details in the Media tab (listed as Progress notes on the On Treatment Visit Dates listed above). The patient tolerated radiation. She developed fatigue and anticipated skin changes in the treatment field. She also had symptoms of esophagitis   The patient will receive a call in about one month from the radiation oncology department. She will continue follow up with Dr. Sherrod as well.      Donald KYM Husband, PAC

## 2024-04-12 ENCOUNTER — Ambulatory Visit

## 2024-04-13 ENCOUNTER — Ambulatory Visit

## 2024-04-18 ENCOUNTER — Ambulatory Visit (INDEPENDENT_AMBULATORY_CARE_PROVIDER_SITE_OTHER)

## 2024-04-18 DIAGNOSIS — Z0000 Encounter for general adult medical examination without abnormal findings: Secondary | ICD-10-CM | POA: Diagnosis not present

## 2024-04-18 NOTE — Progress Notes (Signed)
 Subjective:   Kelly Gibson is a 63 y.o. who presents for a Medicare Wellness preventive visit.  As a reminder, Annual Wellness Visits don't include a physical exam, and some assessments may be limited, especially if this visit is performed virtually. We may recommend an in-person follow-up visit with your provider if needed.  Visit Complete: Virtual I connected with  Kelly Gibson on 04/18/24 by a audio enabled telemedicine application and verified that I am speaking with the correct person using two identifiers.  Patient Location: Home  Provider Location: Office/Clinic  I discussed the limitations of evaluation and management by telemedicine. The patient expressed understanding and agreed to proceed.  Vital Signs: Because this visit was a virtual/telehealth visit, some criteria may be missing or patient reported. Any vitals not documented were not able to be obtained and vitals that have been documented are patient reported.  VideoError- Librarian, academic were attempted between this provider and patient, however failed, due to patient having technical difficulties OR patient did not have access to video capability.  We continued and completed visit with audio only.   Persons Participating in Visit: Patient.  AWV Questionnaire: No: Patient Medicare AWV questionnaire was not completed prior to this visit.  Cardiac Risk Factors include: diabetes mellitus;dyslipidemia     Objective:    Today's Vitals   There is no height or weight on file to calculate BMI.     04/18/2024    4:16 PM 03/21/2024   11:54 AM 03/09/2024   11:56 AM 02/17/2024    8:50 AM 01/12/2024    8:23 AM 01/03/2024    1:08 PM 12/30/2023    6:20 PM  Advanced Directives  Does Patient Have a Medical Advance Directive? Yes No No No No No   Type of Media planner of Healthcare Power of Attorney in Chart? Yes - validated most recent copy  scanned in chart (See row information)        Would patient like information on creating a medical advance directive?  No - Patient declined No - Patient declined No - Patient declined No - Patient declined  No - Patient declined    Current Medications (verified) Outpatient Encounter Medications as of 04/18/2024  Medication Sig   albuterol  (PROVENTIL ) (2.5 MG/3ML) 0.083% nebulizer solution Inhale 3 mLs (2.5 mg total) into the lungs every 6 (six) hours as needed for wheezing or shortness of breath.   albuterol  (VENTOLIN  HFA) 108 (90 Base) MCG/ACT inhaler Inhale 2 puffs into the lungs every 6 (six) hours as needed for wheezing or shortness of breath.   amLODipine  (NORVASC ) 5 MG tablet Take 5 mg by mouth daily.   apixaban  (ELIQUIS ) 5 MG TABS tablet Take 1 tablet (5 mg total) by mouth 2 (two) times daily.   aspirin  EC 81 MG tablet Take 1 tablet (81 mg total) by mouth daily. Swallow whole.   budesonide -glycopyrrolate -formoterol  (BREZTRI  AEROSPHERE) 160-9-4.8 MCG/ACT AERO inhaler Inhale 2 puffs into the lungs 2 (two) times daily.   citalopram  (CELEXA ) 10 MG tablet TAKE 1 TABLET BY MOUTH DAILY   Continuous Glucose Sensor (DEXCOM G7 SENSOR) MISC Take 1 Device by mouth See admin instructions. Change sensor every 10 days   Doxylamine  Succinate, Sleep, (UNISOM  PO) Take 2 each by mouth at bedtime. Gel capsules   furosemide  (LASIX ) 20 MG tablet Take 1 tablet (20 mg total) by mouth daily as needed for fluid or edema (take if  daily weight is greater than 2 lbs from the previous day's weight.).   HUMALOG  KWIKPEN 100 UNIT/ML KwikPen INJECT 3 TO 5 UNITS INTO THE SKIN DAILY BEFORE LUNCH   hydrALAZINE  (APRESOLINE ) 100 MG tablet 1 tablet up to every 12 hours only as needed for blood pressure >150/90   insulin  degludec (TRESIBA  FLEXTOUCH) 100 UNIT/ML FlexTouch Pen Inject 10 Units into the skin 2 (two) times daily. Adjust by 2 units every 3 days as needed for blood sugars higher than 150 in the morning fasting.    lidocaine -prilocaine  (EMLA ) cream Apply to the Port-A-Cath site 30-60 minutes before treatment   Melatonin 5 MG CHEW Chew 10 mg by mouth at bedtime.   Multiple Vitamins-Minerals (MULTIVITAMIN GUMMIES WOMENS) CHEW Chew 2 each by mouth in the morning.   olmesartan (BENICAR) 40 MG tablet Take 40 mg by mouth in the morning.   prochlorperazine  (COMPAZINE ) 10 MG tablet Take 1 tablet (10 mg total) by mouth every 6 (six) hours as needed.   rosuvastatin  (CRESTOR ) 40 MG tablet TAKE 1 TABLET BY MOUTH DAILY   sucralfate  (CARAFATE ) 1 g tablet Take 1 tablet (1 g total) by mouth 4 (four) times daily -  with meals and at bedtime.   cefadroxil  (DURICEF) 500 MG capsule Take 1 capsule (500 mg total) by mouth daily. (Patient not taking: Reported on 04/18/2024)   UNABLE TO FIND Take 1 tablet by mouth with breakfast, with lunch, and with evening meal. GlucoGold -Berberine, Concentrated Cinnamon, Chromium, Banaba Leaf Extract (Patient not taking: Reported on 04/18/2024)   No facility-administered encounter medications on file as of 04/18/2024.    Allergies (verified) Latex and Niacin   History: Past Medical History:  Diagnosis Date   Bradycardia    SR/SB with up to 3.5 second pause (asymptomatic) on ~ 10/2017 event monitor; saw EP Dr. Kelsie: avoid AV nodal blocking agents   Chronic kidney disease    COPD (chronic obstructive pulmonary disease) (HCC)    Depression    Depression with anxiety 08/29/2021   Diabetes mellitus    Dysarthria, post-stroke    Dysrhythmia    Ganglion cyst 03/09/2012   Heart block AV complete (HCC) 12/11/2023   History of kidney stones    Hyperlipidemia    Hypertension    Hypokalemia    Myocardial infarction [facility])    NSTEMI   Non-ST elevation (NSTEMI) myocardial infarction (HCC) 12/11/2023   Obesity, unspecified 12/17/2011   Pneumonia    Pneumonia of left lower lobe due to infectious organism 12/12/2023   PONV (postoperative nausea and vomiting)    Skin lesion of face 11/17/2022    Stroke (HCC)    2019   right side hemiparesis   Past Surgical History:  Procedure Laterality Date   CESAREAN SECTION     x 3   IR IMAGING GUIDED PORT INSERTION  03/09/2024   LEFT HEART CATH AND CORONARY ANGIOGRAPHY N/A 12/28/2023   Procedure: LEFT HEART CATH AND CORONARY ANGIOGRAPHY;  Surgeon: Elmira Newman PARAS, MD;  Location: [facility] INVASIVE CV LAB;  Service: Cardiovascular;  Laterality: N/A;   ORIF WRIST FRACTURE Right 05/04/2018   Procedure: OPEN REDUCTION INTERNAL FIXATION (ORIF) RIGHT  WRIST FRACTURE;  Surgeon: Cristy Bonner DASEN, MD;  Location: [facility] OR;  Service: Orthopedics;  Laterality: Right;   TUMOR REMOVAL     left shoulder   VIDEO BRONCHOSCOPY WITH ENDOBRONCHIAL NAVIGATION  01/03/2024   Procedure: VIDEO BRONCHOSCOPY WITH ENDOBRONCHIAL NAVIGATION;  Surgeon: Shelah Lamar RAMAN, MD;  Location: [facility] ENDOSCOPY;  Service: Pulmonary;;  VIDEO BRONCHOSCOPY WITH ENDOBRONCHIAL ULTRASOUND Bilateral 01/03/2024   Procedure: BRONCHOSCOPY, WITH EBUS;  Surgeon: Shelah Lamar RAMAN, MD;  Location: [facility] ENDOSCOPY;  Service: Pulmonary;  Laterality: Bilateral;   Family History  Problem Relation Age of Onset   Diabetes Mother    Stroke Mother    Heart disease Father    Cancer Father    Stroke Father    Social History   Socioeconomic History   Marital status: Married    Spouse name: Not on file   Number of children: Not on file   Years of education: Not on file   Highest education level: Some college, no degree  Occupational History   Not on file  Tobacco Use   Smoking status: Former    Current packs/day: 1.00    Average packs/day: 1 pack/day for 20.0 years (20.0 ttl pk-yrs)    Types: Cigarettes   Smokeless tobacco: Never   Tobacco comments:    stopped 2018  Vaping Use   Vaping status: Never Used  Substance and Sexual Activity   Alcohol use: No   Drug use: No   Sexual activity: Not Currently  Other Topics Concern   Not on file  Social History Narrative   Not on file   Social Drivers of Health    Financial Resource Strain: Low Risk  (04/18/2024)   Overall Financial Resource Strain (CARDIA)    Difficulty of Paying Living Expenses: Not hard at all  Food Insecurity: No Food Insecurity (04/18/2024)   Hunger Vital Sign    Worried About Running Out of Food in the Last Year: Never true    Ran Out of Food in the Last Year: Never true  Transportation Needs: No Transportation Needs (04/18/2024)   PRAPARE - Administrator, Civil Service (Medical): No    Lack of Transportation (Non-Medical): No  Physical Activity: Inactive (04/18/2024)   Exercise Vital Sign    Days of Exercise per Week: 0 days    Minutes of Exercise per Session: 0 min  Stress: No Stress Concern Present (04/18/2024)   [employer] of Occupational Health - Occupational Stress Questionnaire    Feeling of Stress: Not at all  Social Connections: Socially Isolated (04/18/2024)   Social Connection and Isolation Panel    Frequency of Communication with Friends and Family: Never    Frequency of Social Gatherings with Friends and Family: Never    Attends Religious Services: Never    Database administrator or Organizations: No    Attends Engineer, structural: Never    Marital Status: Married    Tobacco Counseling Counseling given: Not Answered Tobacco comments: stopped 2018    Clinical Intake:  Pre-visit preparation completed: Yes  Pain : No/denies pain     Nutritional Risks: Nausea/ vomitting/ diarrhea (nausea from chemo) Diabetes: Yes CBG done?: No Did pt. bring in CBG monitor from home?: No  Lab Results  Component Value Date   HGBA1C 7.3 (H) 01/10/2024   HGBA1C 8.0 (H) 12/11/2023   HGBA1C 8.2 (H) 10/05/2022     How often do you need to have someone help you when you read instructions, pamphlets, or other written materials from your doctor or pharmacy?: 1 - Never  Interpreter Needed?: No  Information entered by :: NAllen LPN   Activities of Daily Living     04/18/2024    4:09  PM 03/09/2024   11:55 AM  In your present state of health, do you have any difficulty performing the following activities:  Hearing? 0 0  Vision? 1 0  Comment blurry vision   Difficulty concentrating or making decisions? 1 0  Comment trouble with memory   Walking or climbing stairs? 1   Dressing or bathing? 1   Doing errands, shopping? 1   Comment husband goes with   Preparing Food and eating ? N   Using the Toilet? N   In the past six months, have you accidently leaked urine? Y   Do you have problems with loss of bowel control? N   Managing your Medications? Y   Managing your Finances? N   Housekeeping or managing your Housekeeping? Y     Patient Care Team: Early, [name], NP as PCP - General (Nurse Practitioner) Mona Vinie BROCKS, MD as PCP - Cardiology (Cardiology) Pcp, No Pa, [facility], Jon DEL, [facility] (Pharmacist)  I have updated your Care Teams any recent Medical Services you may have received from other providers in the past year.     Assessment:   This is a routine wellness examination for Kelly Gibson.  Hearing/Vision screen Hearing Screening - Comments:: Denies hearing issues Vision Screening - Comments:: No regular eye exams,   Goals Addressed             This Visit's Progress    Patient Stated       04/18/2024, trying to get clear from cancer       Depression Screen     04/18/2024    4:17 PM 04/04/2024   11:07 AM 03/14/2024    1:00 PM 02/28/2024    1:10 PM 01/10/2024    2:05 PM 08/29/2021    2:51 PM 04/19/2021   10:54 AM  PHQ 2/9 Scores  PHQ - 2 Score 0 0 4 6 2 1 1   PHQ- 9 Score   10 6 4 2      Fall Risk     04/18/2024    4:16 PM 01/10/2024    2:08 PM 10/05/2022    1:31 PM 01/30/2022    2:44 PM 04/19/2021   10:57 AM  Fall Risk   Falls in the past year? 0 1 1 1 1   Number falls in past yr: 0 1 0 0 0  Injury with Fall? 0 0 0 0 0  Risk for fall due to : Impaired mobility;Impaired balance/gait;Medication side effect Impaired mobility  No Fall Risks Impaired balance/gait;Impaired mobility Impaired mobility  Follow up Falls evaluation completed;Falls prevention discussed Falls evaluation completed Falls evaluation completed Falls evaluation completed       Data saved with a previous flowsheet row definition    MEDICARE RISK AT HOME:  Medicare Risk at Home Any stairs in or around the home?: Yes (chair lift) If so, are there any without handrails?: No Home free of loose throw rugs in walkways, pet beds, electrical cords, etc?: Yes Adequate lighting in your home to reduce risk of falls?: Yes Life alert?: No Use of a cane, walker or w/c?: Yes Grab bars in the bathroom?: Yes Shower chair or bench in shower?: Yes Elevated toilet seat or a handicapped toilet?: Yes  TIMED UP AND GO:  Was the test performed?  No  Cognitive Function: 6CIT completed        04/18/2024    4:18 PM 04/19/2021   10:56 AM  6CIT Screen  What Year? 0 points 0 points  What month? 0 points 0 points  What time? 0 points 0 points  Count back from 20 0 points 4 points  Months in reverse 4 points 4 points  Repeat phrase 2 points 0 points  Total Score 6 points 8 points    Immunizations Immunization History  Administered Date(s) Administered    sv, Bivalent, Protein Subunit Rsvpref,pf (Abrysvo) 08/30/2022   Hepatitis A, Adult 04/15/2021, 11/12/2021, 02/22/2022   Hepatitis B, ADULT 04/15/2021, 11/12/2021, 02/22/2022   Influenza,inj,Quad PF,6+ Mos 06/16/2013, 04/11/2019, 05/22/2020, 07/06/2022   Influenza-Unspecified 04/21/2021   Janssen (J&J) SARS-COV-2 Vaccination 09/25/2019   Moderna Covid-19 Vaccine Bivalent Booster 66yrs & up 04/21/2021   Moderna Sars-Covid-2 Vaccination 07/06/2022   PFIZER(Purple Top)SARS-COV-2 Vaccination 06/05/2020   Pneumococcal Conjugate (Pcv15) 04/15/2021   Pneumococcal Polysaccharide-23 02/18/2015   Tdap 04/15/2021   Zoster Recombinant(Shingrix) 04/15/2021, 08/03/2021    Screening Tests Health Maintenance   Topic Date Due   Cervical Cancer Screening (HPV/Pap Cotest)  Never done   Colonoscopy  Never done   Diabetic kidney evaluation - Urine ACR  12/26/2016   FOOT EXAM  04/10/2020   OPHTHALMOLOGY EXAM  06/25/2023   COVID-19 Vaccine (5 - 2025-26 season) 03/27/2024   Influenza Vaccine  04/26/2025 (Originally 02/25/2024)   HEMOGLOBIN A1C  07/11/2024   Diabetic kidney evaluation - eGFR measurement  04/04/2025   Medicare Annual Wellness (AWV)  04/18/2025   DTaP/Tdap/Td (2 - Td or Tdap) 04/16/2031   Pneumococcal Vaccine: 50+ Years  Completed   Hepatitis B Vaccines 19-59 Average Risk  Completed   Hepatitis C Screening  Completed   HIV Screening  Completed   Zoster Vaccines- Shingrix  Completed   HPV VACCINES  Aged Out   Meningococcal B Vaccine  Aged Out   Lung Cancer Screening  Discontinued   Mammogram  Discontinued    Health Maintenance Items Addressed: Referral sent to GI for colonoscopy, Due for flu and covid vaccine  Additional Screening:  Vision Screening: Recommended annual ophthalmology exams for early detection of glaucoma and other disorders of the eye. Is the patient up to date with their annual eye exam?  No  Who is the provider or what is the name of the office in which the patient attends annual eye exams? none  Dental Screening: Recommended annual dental exams for proper oral hygiene  Community Resource Referral / Chronic Care Management: CRR required this visit?  No   CCM required this visit?  No   Plan:    I have personally reviewed and noted the following in the patient's chart:   Medical and social history Use of alcohol, tobacco or illicit drugs  Current medications and supplements including opioid prescriptions. Patient is not currently taking opioid prescriptions. Functional ability and status Nutritional status Physical activity Advanced directives List of other physicians Hospitalizations, surgeries, and ER visits in previous 12  months Vitals Screenings to include cognitive, depression, and falls Referrals and appointments  In addition, I have reviewed and discussed with patient certain preventive protocols, quality metrics, and best practice recommendations. A written personalized care plan for preventive services as well as general preventive health recommendations were provided to patient.   Ardella FORBES Dawn, LPN   [id number]   After Visit Summary: (MyChart) Due to this being a telephonic visit, the after visit summary with patients personalized plan was offered to patient via MyChart   Notes: Nothing significant to report at this time.

## 2024-04-18 NOTE — Patient Instructions (Signed)
 Kelly Gibson,  Thank you for taking the time for your Medicare Wellness Visit. I appreciate your continued commitment to your health goals. Please review the care plan we discussed, and feel free to reach out if I can assist you further.  Medicare recommends these wellness visits once per year to help you and your care team stay ahead of potential health issues. These visits are designed to focus on prevention, allowing your provider to concentrate on managing your acute and chronic conditions during your regular appointments.  Please note that Annual Wellness Visits do not include a physical exam. Some assessments may be limited, especially if the visit was conducted virtually. If needed, we may recommend a separate in-person follow-up with your provider.  Ongoing Care Seeing your primary care provider every 3 to 6 months helps us  monitor your health and provide consistent, personalized care.   Referrals If a referral was made during today's visit and you haven't received any updates within two weeks, please contact the referred provider directly to check on the status.  Recommended Screenings:  Health Maintenance  Topic Date Due   Pap with HPV screening  Never done   Colon Cancer Screening  Never done   Yearly kidney health urinalysis for diabetes  12/26/2016   Complete foot exam   04/10/2020   Eye exam for diabetics  06/25/2023   COVID-19 Vaccine (5 - 2025-26 season) 03/27/2024   Flu Shot  04/26/2025*   Hemoglobin A1C  07/11/2024   Yearly kidney function blood test for diabetes  04/04/2025   Medicare Annual Wellness Visit  04/18/2025   DTaP/Tdap/Td vaccine (2 - Td or Tdap) 04/16/2031   Pneumococcal Vaccine for age over 49  Completed   Hepatitis B Vaccine  Completed   Hepatitis C Screening  Completed   HIV Screening  Completed   Zoster (Shingles) Vaccine  Completed   HPV Vaccine  Aged Out   Meningitis B Vaccine  Aged Out   Screening for Lung Cancer  Discontinued   Breast Cancer  Screening  Discontinued  *Topic was postponed. The date shown is not the original due date.       04/18/2024    4:16 PM  Advanced Directives  Does Patient Have a Medical Advance Directive? Yes  Type of Advance Directive Healthcare Power of Attorney  Copy of Healthcare Power of Attorney in Chart? Yes - validated most recent copy scanned in chart (See row information)   Advance Care Planning is important because it: Ensures you receive medical care that aligns with your values, goals, and preferences. Provides guidance to your family and loved ones, reducing the emotional burden of decision-making during critical moments.  Vision: Annual vision screenings are recommended for early detection of glaucoma, cataracts, and diabetic retinopathy. These exams can also reveal signs of chronic conditions such as diabetes and high blood pressure.  Dental: Annual dental screenings help detect early signs of oral cancer, gum disease, and other conditions linked to overall health, including heart disease and diabetes.  Please see the attached documents for additional preventive care recommendations.

## 2024-04-19 ENCOUNTER — Other Ambulatory Visit: Payer: Self-pay

## 2024-04-20 ENCOUNTER — Other Ambulatory Visit (INDEPENDENT_AMBULATORY_CARE_PROVIDER_SITE_OTHER)

## 2024-04-20 DIAGNOSIS — E1165 Type 2 diabetes mellitus with hyperglycemia: Secondary | ICD-10-CM

## 2024-04-20 DIAGNOSIS — Z794 Long term (current) use of insulin: Secondary | ICD-10-CM

## 2024-04-20 NOTE — Progress Notes (Signed)
   04/20/2024 Name: Kelly Gibson MRN: [id number] DOB: 06/29/1961  No chief complaint on file.   Kelly Gibson is a 63 y.o. year old female who presented for a telephone visit.   They were referred to the pharmacist by their PCP for assistance in managing diabetes, medication access, and complex medication management.    Subjective:  Care Team: Primary Care Provider: Oris [name], NP ; Next Scheduled Visit: 05/15/24  Medication Access/Adherence  Current Pharmacy:  [facility] [id number] - [place], [place] - [address] [place] [address] Phone: [phone] Fax: [phone]  [name] [address] [place] [address] Phone: [phone] Fax: [phone]   Patient reports affordability concerns with their medications: Yes  - tradjenta  in past but was discontinued in the last 1-2 weeks at most recent hospitalization; also paying over $100 per month on insulin  therapy and inhaler Patient reports access/transportation concerns to their pharmacy: No  Patient reports adherence concerns with their medications:  No     Diabetes:  Current medications: Tresiba  22 units in morning, 10 units at night; Humalog  SS  Medications tried in the past: Tradjenta , Glyburide , Metformin   Current glucose readings:   Observed patterns:  Patient denies hypoglycemic s/sx including dizziness, shakiness, sweating. Patient denies hyperglycemic symptoms including polyuria, polydipsia, polyphagia, nocturia, neuropathy, blurred vision.   Current medication access support: Tresiba /Novolog /Pen Needles through [employer] PAP; Breztri  through [employer]   Objective:  Lab Results  Component Value Date   HGBA1C 7.3 (H) 01/10/2024    Lab Results  Component Value Date   CREATININE 1.16 (H) 04/04/2024   BUN 23 04/04/2024   NA 141 04/04/2024   K 3.6 04/04/2024   CL 106 04/04/2024   CO2 30 04/04/2024    Lab Results   Component Value Date   CHOL 159 01/10/2024   HDL 62 01/10/2024   LDLCALC 71 01/10/2024   LDLDIRECT 70 01/10/2024   TRIG 157 (H) 01/10/2024   CHOLHDL 2.9 01/30/2022    Medications Reviewed Today   Medications were not reviewed in this encounter       Assessment/Plan:   Diabetes: - Currently uncontrolled but improving - Reviewed long term cardiovascular and renal outcomes of uncontrolled blood sugar - Reviewed goal A1c, goal fasting, and goal 2 hour post prandial glucose - Recommend to continue current medication therapy. Consider insulin  adjustment or addition of SGLT2 if A1C still above 7 at next check in October -Has not received Tresiba /Novolog  PAP from company just yet as they are having delays, still have some on hand at this time. Instructed to contact us  if they get down to 3-4 days left and our office has not notified them it has arrived.   Follow Up Plan: 06/01/24  Jon VEAR Lindau, PharmD Clinical Pharmacist [phone]

## 2024-05-01 ENCOUNTER — Telehealth: Payer: Self-pay | Admitting: Nurse Practitioner

## 2024-05-01 NOTE — Telephone Encounter (Signed)
 Husband called regarding PAP for Tresiba  & Novolog  as pt is running low,  will get Berdine Slough to check with [employer] to see when should receive shipment

## 2024-05-02 ENCOUNTER — Ambulatory Visit (HOSPITAL_COMMUNITY)
Admission: RE | Admit: 2024-05-02 | Discharge: 2024-05-02 | Disposition: A | Discharge status: Home / Self Care | Source: Ambulatory Visit | Attending: Internal Medicine | Admitting: Internal Medicine

## 2024-05-02 ENCOUNTER — Inpatient Hospital Stay: Attending: Internal Medicine

## 2024-05-02 DIAGNOSIS — C3432 Malignant neoplasm of lower lobe, left bronchus or lung: Secondary | ICD-10-CM | POA: Diagnosis present

## 2024-05-02 DIAGNOSIS — Z79620 Long term (current) use of immunosuppressive biologic: Secondary | ICD-10-CM | POA: Insufficient documentation

## 2024-05-02 DIAGNOSIS — C3490 Malignant neoplasm of unspecified part of unspecified bronchus or lung: Secondary | ICD-10-CM

## 2024-05-02 DIAGNOSIS — Z5112 Encounter for antineoplastic immunotherapy: Secondary | ICD-10-CM | POA: Diagnosis present

## 2024-05-02 DIAGNOSIS — C771 Secondary and unspecified malignant neoplasm of intrathoracic lymph nodes: Secondary | ICD-10-CM | POA: Diagnosis not present

## 2024-05-02 LAB — CBC WITH DIFFERENTIAL (CANCER CENTER ONLY)
Abs Immature Granulocytes: 0.02 K/uL (ref 0.00–0.07)
Basophils Absolute: 0.1 K/uL (ref 0.0–0.1)
Basophils Relative: 1 %
Eosinophils Absolute: 0.2 K/uL (ref 0.0–0.5)
Eosinophils Relative: 3 %
HCT: 29.5 % — ABNORMAL LOW (ref 36.0–46.0)
Hemoglobin: 9.5 g/dL — ABNORMAL LOW (ref 12.0–15.0)
Immature Granulocytes: 0 %
Lymphocytes Relative: 6 %
Lymphs Abs: 0.4 K/uL — ABNORMAL LOW (ref 0.7–4.0)
MCH: 27.6 pg (ref 26.0–34.0)
MCHC: 32.2 g/dL (ref 30.0–36.0)
MCV: 85.8 fL (ref 80.0–100.0)
Monocytes Absolute: 0.6 K/uL (ref 0.1–1.0)
Monocytes Relative: 10 %
Neutro Abs: 4.9 K/uL (ref 1.7–7.7)
Neutrophils Relative %: 80 %
Platelet Count: 275 K/uL (ref 150–400)
RBC: 3.44 MIL/uL — ABNORMAL LOW (ref 3.87–5.11)
RDW: 21.9 % — ABNORMAL HIGH (ref 11.5–15.5)
WBC Count: 6.2 K/uL (ref 4.0–10.5)
nRBC: 0 % (ref 0.0–0.2)

## 2024-05-02 LAB — CMP (CANCER CENTER ONLY)
ALT: 6 U/L (ref 0–44)
AST: 12 U/L — ABNORMAL LOW (ref 15–41)
Albumin: 3.4 g/dL — ABNORMAL LOW (ref 3.5–5.0)
Alkaline Phosphatase: 69 U/L (ref 38–126)
Anion gap: 5 (ref 5–15)
BUN: 14 mg/dL (ref 8–23)
CO2: 29 mmol/L (ref 22–32)
Calcium: 9.3 mg/dL (ref 8.9–10.3)
Chloride: 106 mmol/L (ref 98–111)
Creatinine: 1.3 mg/dL — ABNORMAL HIGH (ref 0.44–1.00)
GFR, Estimated: 47 mL/min — ABNORMAL LOW (ref >60)
Glucose, Bld: 153 mg/dL — ABNORMAL HIGH (ref 70–99)
Potassium: 3.6 mmol/L (ref 3.5–5.1)
Sodium: 140 mmol/L (ref 135–145)
Total Bilirubin: 0.4 mg/dL (ref 0.0–1.2)
Total Protein: 7.1 g/dL (ref 6.5–8.1)

## 2024-05-02 LAB — LAB REPORT - SCANNED: Microalb Creat Ratio: 32

## 2024-05-02 LAB — MICROALBUMIN / CREATININE URINE RATIO: Microalb Creat Ratio: 32

## 2024-05-02 MED ORDER — SODIUM CHLORIDE (PF) 0.9 % IJ SOLN
INTRAMUSCULAR | Status: AC
  Filled 2024-05-02: qty 50

## 2024-05-02 MED ORDER — HEPARIN SOD (PORK) LOCK FLUSH 100 UNIT/ML IV SOLN
500.0000 [IU] | Freq: Once | INTRAVENOUS | Status: AC
  Administered 2024-05-02: 500 [IU] via INTRAVENOUS

## 2024-05-02 MED ORDER — IOHEXOL 300 MG/ML  SOLN
65.0000 mL | Freq: Once | INTRAMUSCULAR | Status: AC | PRN
  Administered 2024-05-02: 65 mL via INTRAVENOUS

## 2024-05-02 MED ORDER — IOHEXOL 300 MG/ML  SOLN: 75.0000 mL | Freq: Once | INTRAMUSCULAR | Status: DC | PRN

## 2024-05-02 MED ORDER — HEPARIN SOD (PORK) LOCK FLUSH 100 UNIT/ML IV SOLN
INTRAVENOUS | Status: AC
  Filled 2024-05-02: qty 5

## 2024-05-02 NOTE — Patient Instructions (Signed)

## 2024-05-02 NOTE — Telephone Encounter (Signed)
 Spoke with [employer]. Patient's shipment is out for delivery to office this morning. Notified Mr. Dehaan to be on the lookout for a call from the office letting him know to come by and pick up.

## 2024-05-02 NOTE — Telephone Encounter (Signed)
 PAP Tresiba  & Novolog  & Pen needles received, left messages for pt

## 2024-05-04 ENCOUNTER — Telehealth: Payer: Self-pay

## 2024-05-04 NOTE — Telephone Encounter (Signed)
 Gave pt a call,pt is coming up due for reenrollment, left a HIPAA VM, will mail out pt portion and faxed provider portion.

## 2024-05-05 NOTE — Progress Notes (Signed)
  Radiation Oncology         [phone] ________________________________  Name: Kelly Gibson MRN: [id number]  Date of Service: 05/08/2024  DOB: 1961-03-06  Post Treatment Telephone Note  Diagnosis: Stage IIIC, [id number], NSCLC, adenocarcinoma of the LLL    First Treatment Date: 2024-02-28 Last Treatment Date: 2024-04-10   Plan Name: Lung_L Site: Lung, Left Technique: 3D Mode: Photon Dose Per Fraction: 2 Gy Prescribed Dose (Delivered / Prescribed): 60 Gy / 60 Gy Prescribed Fxs (Delivered / Prescribed): 30 / 30     The patient was available for call today.   Symptoms of fatigue have improved since completing therapy.  Symptoms of skin changes have improved since completing therapy.  Symptoms of esophagitis have improved since completing therapy.   The patient has scheduled follow up with her medical oncologist Dr. Sherrod for ongoing care, and was encouraged to call if she develops concerns or questions regarding radiation.

## 2024-05-08 ENCOUNTER — Ambulatory Visit
Admission: RE | Admit: 2024-05-08 | Discharge: 2024-05-08 | Disposition: A | Discharge status: Home / Self Care | Source: Ambulatory Visit | Attending: Internal Medicine | Admitting: Internal Medicine

## 2024-05-08 DIAGNOSIS — C3432 Malignant neoplasm of lower lobe, left bronchus or lung: Secondary | ICD-10-CM | POA: Insufficient documentation

## 2024-05-09 ENCOUNTER — Inpatient Hospital Stay: Admitting: Internal Medicine

## 2024-05-09 VITALS — BP 118/64 | HR 52 | Temp 97.3°F | Resp 17 | Ht 63.0 in | Wt 183.0 lb

## 2024-05-09 DIAGNOSIS — C3432 Malignant neoplasm of lower lobe, left bronchus or lung: Secondary | ICD-10-CM

## 2024-05-09 DIAGNOSIS — Z5112 Encounter for antineoplastic immunotherapy: Secondary | ICD-10-CM | POA: Diagnosis not present

## 2024-05-09 NOTE — Progress Notes (Signed)
 [facility] [phone]   [phone]  OFFICE PROGRESS NOTE  Early, [name], NP [address] [place] [address]  DIAGNOSIS: Stage IIIC (T3, N3, M0) NSCLC, adenocarcinoma. She presented with left lower lobe mass and intermittent activity with nonenlarged mediastinal lymph nodes.  She was diagnosed in June 2025.  Biomarker Findings HRD signature - Cannot Be Determined Microsatellite status - Cannot Be Determined ? Tumor Mutational Burden - Cannot Be Determined Genomic Findings For a complete list of the genes assayed, please refer to the Appendix. KRAS G12V 7 Disease relevant genes with no reportable alterations: ALK, BRAF, EGFR, ERBB2, MET, RET, ROS1     PRIOR THERAPY: Concurrent chemoradiation with weekly carboplatin  for an AUC of 2 and paclitaxel  45 mg/m.  First dose on 02/28/2024.  Status post 6 cycles.  Last dose was given on 04/04/2024.   CURRENT THERAPY: Consolidation treatment with immunotherapy with Imfinzi 1500 mg IV every 4 weeks.  First dose May 16, 2024.  INTERVAL HISTORY: Kelly Gibson 63 y.o. female returns to the clinic today for follow-up visit accompanied by her husband and daughter.Discussed the use of AI scribe software for clinical note transcription with the patient, who gave verbal consent to proceed.  History of Present Illness Kelly Gibson is a 63 year old female with stage 3C non-small cell lung cancer who presents for evaluation with repeat CT scan of the chest for restaging of her disease. She is accompanied by her husband and daughter.  Diagnosed with stage 3C non-small cell lung cancer, adenocarcinoma, in June 2025, with no actionable mutations. Completed a course of concurrent chemoradiation with weekly carboplatin  and paclitaxel , finishing the sixth cycle on August 05, 2023.  Experiences nausea as a side effect of chemotherapy, which was more prominent last week but has improved this  week. Uses nausea medication at home as needed, which she finds effective.  A recent CT scan of the chest was performed to restage her disease. She has a history of heart blockage, initially planned for stent placement, which was deferred due to cancer treatment. Also has a history of kidney stones.  No current complaints. No new symptoms.      MEDICAL HISTORY: Past Medical History:  Diagnosis Date   Bradycardia    SR/SB with up to 3.5 second pause (asymptomatic) on ~ 10/2017 event monitor; saw EP Dr. Kelsie: avoid AV nodal blocking agents   Chronic kidney disease    COPD (chronic obstructive pulmonary disease) (HCC)    Depression    Depression with anxiety 08/29/2021   Diabetes mellitus    Dysarthria, post-stroke    Dysrhythmia    Ganglion cyst 03/09/2012   Heart block AV complete (HCC) 12/11/2023   History of kidney stones    Hyperlipidemia    Hypertension    Hypokalemia    Myocardial infarction [facility])    NSTEMI   Non-ST elevation (NSTEMI) myocardial infarction (HCC) 12/11/2023   Obesity, unspecified 12/17/2011   Pneumonia    Pneumonia of left lower lobe due to infectious organism 12/12/2023   PONV (postoperative nausea and vomiting)    Skin lesion of face 11/17/2022   Stroke (HCC)    2019   right side hemiparesis    ALLERGIES:  is allergic to latex and niacin.  MEDICATIONS:  Current Outpatient Medications  Medication Sig Dispense Refill   albuterol  (PROVENTIL ) (2.5 MG/3ML) 0.083% nebulizer solution Inhale 3 mLs (2.5 mg total) into the lungs every 6 (six) hours  as needed for wheezing or shortness of breath. 75 mL 12   albuterol  (VENTOLIN  HFA) 108 (90 Base) MCG/ACT inhaler Inhale 2 puffs into the lungs every 6 (six) hours as needed for wheezing or shortness of breath. 18 g 2   amLODipine  (NORVASC ) 5 MG tablet Take 5 mg by mouth daily.     apixaban  (ELIQUIS ) 5 MG TABS tablet Take 1 tablet (5 mg total) by mouth 2 (two) times daily. 60 tablet 3   aspirin  EC 81 MG tablet  Take 1 tablet (81 mg total) by mouth daily. Swallow whole. 30 tablet 12   budesonide -glycopyrrolate -formoterol  (BREZTRI  AEROSPHERE) 160-9-4.8 MCG/ACT AERO inhaler Inhale 2 puffs into the lungs 2 (two) times daily. 10.7 g 1   cefadroxil  (DURICEF) 500 MG capsule Take 1 capsule (500 mg total) by mouth daily. (Patient not taking: Reported on 04/18/2024) 5 capsule 0   citalopram  (CELEXA ) 10 MG tablet TAKE 1 TABLET BY MOUTH DAILY 90 tablet 1   Continuous Glucose Sensor (DEXCOM G7 SENSOR) MISC Take 1 Device by mouth See admin instructions. Change sensor every 10 days     Doxylamine  Succinate, Sleep, (UNISOM  PO) Take 2 each by mouth at bedtime. Gel capsules     furosemide  (LASIX ) 20 MG tablet Take 1 tablet (20 mg total) by mouth daily as needed for fluid or edema (take if daily weight is greater than 2 lbs from the previous day's weight.). 90 tablet 0   HUMALOG  KWIKPEN 100 UNIT/ML KwikPen INJECT 3 TO 5 UNITS INTO THE SKIN DAILY BEFORE LUNCH 6 mL 3   hydrALAZINE  (APRESOLINE ) 100 MG tablet 1 tablet up to every 12 hours only as needed for blood pressure >150/90     insulin  degludec (TRESIBA  FLEXTOUCH) 100 UNIT/ML FlexTouch Pen Inject 10 Units into the skin 2 (two) times daily. Adjust by 2 units every 3 days as needed for blood sugars higher than 150 in the morning fasting. 9 mL 11   lidocaine -prilocaine  (EMLA ) cream Apply to the Port-A-Cath site 30-60 minutes before treatment 30 g 1   Melatonin 5 MG CHEW Chew 10 mg by mouth at bedtime.     Multiple Vitamins-Minerals (MULTIVITAMIN GUMMIES WOMENS) CHEW Chew 2 each by mouth in the morning.     olmesartan (BENICAR) 40 MG tablet Take 40 mg by mouth in the morning.     prochlorperazine  (COMPAZINE ) 10 MG tablet Take 1 tablet (10 mg total) by mouth every 6 (six) hours as needed. 30 tablet 2   rosuvastatin  (CRESTOR ) 40 MG tablet TAKE 1 TABLET BY MOUTH DAILY 90 tablet 3   sucralfate  (CARAFATE ) 1 g tablet Take 1 tablet (1 g total) by mouth 4 (four) times daily -  with  meals and at bedtime. 120 tablet 1   UNABLE TO FIND Take 1 tablet by mouth with breakfast, with lunch, and with evening meal. GlucoGold -Berberine, Concentrated Cinnamon, Chromium, Banaba Leaf Extract (Patient not taking: Reported on 04/18/2024)     No current facility-administered medications for this visit.    SURGICAL HISTORY:  Past Surgical History:  Procedure Laterality Date   CESAREAN SECTION     x 3   IR IMAGING GUIDED PORT INSERTION  03/09/2024   LEFT HEART CATH AND CORONARY ANGIOGRAPHY N/A 12/28/2023   Procedure: LEFT HEART CATH AND CORONARY ANGIOGRAPHY;  Surgeon: Elmira Newman PARAS, MD;  Location: [facility] INVASIVE CV LAB;  Service: Cardiovascular;  Laterality: N/A;   ORIF WRIST FRACTURE Right 05/04/2018   Procedure: OPEN REDUCTION INTERNAL FIXATION (ORIF) RIGHT  WRIST FRACTURE;  Surgeon: Cristy Bonner DASEN, MD;  Location: [facility] OR;  Service: Orthopedics;  Laterality: Right;   TUMOR REMOVAL     left shoulder   VIDEO BRONCHOSCOPY WITH ENDOBRONCHIAL NAVIGATION  01/03/2024   Procedure: VIDEO BRONCHOSCOPY WITH ENDOBRONCHIAL NAVIGATION;  Surgeon: Shelah Lamar RAMAN, MD;  Location: [facility] ENDOSCOPY;  Service: Pulmonary;;   VIDEO BRONCHOSCOPY WITH ENDOBRONCHIAL ULTRASOUND Bilateral 01/03/2024   Procedure: BRONCHOSCOPY, WITH EBUS;  Surgeon: Shelah Lamar RAMAN, MD;  Location: [facility] ENDOSCOPY;  Service: Pulmonary;  Laterality: Bilateral;    REVIEW OF SYSTEMS:  Constitutional: positive for fatigue Eyes: negative Ears, nose, mouth, throat, and face: negative Respiratory: positive for dyspnea on exertion Cardiovascular: negative Gastrointestinal: positive for nausea Genitourinary:negative Integument/breast: negative Hematologic/lymphatic: negative Musculoskeletal:negative Neurological: negative Behavioral/Psych: negative Endocrine: negative Allergic/Immunologic: negative   PHYSICAL EXAMINATION: General appearance: alert, cooperative, fatigued, and no distress Head: Normocephalic, without obvious abnormality,  atraumatic Neck: no adenopathy, no JVD, supple, symmetrical, trachea midline, and thyroid not enlarged, symmetric, no tenderness/mass/nodules Lymph nodes: Cervical, supraclavicular, and axillary nodes normal. Resp: clear to auscultation bilaterally Back: symmetric, no curvature. ROM normal. No CVA tenderness. Cardio: regular rate and rhythm, S1, S2 normal, no murmur, click, rub or gallop GI: soft, non-tender; bowel sounds normal; no masses,  no organomegaly Extremities: extremities normal, atraumatic, no cyanosis or edema Neurologic: Alert and oriented X 3, normal strength and tone. Normal symmetric reflexes. Normal coordination and gait  ECOG PERFORMANCE STATUS: 1 - Symptomatic but completely ambulatory  Blood pressure 118/64, pulse (!) 52, temperature (!) 97.3 F (36.3 C), resp. rate 17, height 5' 3 (1.6 m), weight 183 lb (83 kg), last menstrual period 12/10/2012, SpO2 97%.  LABORATORY DATA: Lab Results  Component Value Date   WBC 6.2 05/02/2024   HGB 9.5 (L) 05/02/2024   HCT 29.5 (L) 05/02/2024   MCV 85.8 05/02/2024   PLT 275 05/02/2024      Chemistry      Component Value Date/Time   NA 140 05/02/2024 1138   NA 144 01/10/2024 1508   K 3.6 05/02/2024 1138   CL 106 05/02/2024 1138   CO2 29 05/02/2024 1138   BUN 14 05/02/2024 1138   BUN 27 01/10/2024 1508   CREATININE 1.30 (H) 05/02/2024 1138   CREATININE 1.12 (H) 05/22/2020 1211      Component Value Date/Time   CALCIUM  9.3 05/02/2024 1138   CALCIUM  10.0 08/18/2023 0000   ALKPHOS 69 05/02/2024 1138   AST 12 (L) 05/02/2024 1138   ALT 6 05/02/2024 1138   BILITOT 0.4 05/02/2024 1138       RADIOGRAPHIC STUDIES: CT Chest W Contrast Result Date: 05/05/2024 CLINICAL DATA:  Non-small cell lung cancer.  * Tracking Code: BO * EXAM: CT CHEST WITH CONTRAST TECHNIQUE: Multidetector CT imaging of the chest was performed during intravenous contrast administration. RADIATION DOSE REDUCTION: This exam was performed according to  the departmental dose-optimization program which includes automated exposure control, adjustment of the mA and/or kV according to patient size and/or use of iterative reconstruction technique. CONTRAST:  65mL OMNIPAQUE  IOHEXOL  300 MG/ML  SOLN COMPARISON:  01/12/2024. FINDINGS: Cardiovascular: Right IJ Port-A-Cath terminates in the right atrium. Atherosclerotic calcification of the aorta and aortic valve with age advanced involvement of coronary arteries. Heart is enlarged. No pericardial effusion. Mediastinum/Nodes: 7 mm right supraclavicular lymph node. Mediastinal lymph nodes measure up to 12 mm in the low right paratracheal station, unchanged. Bihilar lymphoid tissue, left greater than right, also stable. No axillary adenopathy. Esophagus is grossly unremarkable. Lungs/Pleura: Post treatment collapse/consolidation, architectural distortion  and volume loss in the left lower lobe, with overall improvement in aeration compared to 01/12/2024. Trace left pleural effusion. Scattered bibasilar subsegmental volume loss. Airway is unremarkable. Upper Abdomen: Gallstone. Tiny left renal stone. Possible small left renal sinus cyst. No specific follow-up necessary. Visualized portions of the liver, gallbladder, adrenal glands, kidneys, spleen, pancreas, stomach and bowel are otherwise grossly unremarkable. No upper abdominal adenopathy. Musculoskeletal: Degenerative changes in the spine. IMPRESSION: 1. Evolving post treatment collapse/consolidation in the left lower lobe, with overall improvement in aeration from 01/12/2024. Stable small to borderline enlarged mediastinal and hilar lymph nodes. No evidence of disease progression. 2. Trace left pleural effusion. 3. Age advanced coronary artery calcification. 4. Cholelithiasis. 5. Tiny left renal stone. 6. Aortic atherosclerosis (ICD10-I70.0). Electronically Signed   By: Newell Eke M.D.   On: 05/05/2024 08:42    ASSESSMENT AND PLAN: This is a very pleasant 63 years old  African-American female with Stage IIIC (T3, N3, M0) NSCLC, adenocarcinoma. She presented with left lower lobe mass and intermittent activity with nonenlarged mediastinal lymph nodes.  She was diagnosed in June 2025. Molecular studies showed no actionable mutations. The patient underwent a course of concurrent chemoradiation with weekly carboplatin  for AUC of 2 and paclitaxel  45 Mg/M2 status post 6 cycles.  Last dose was given on 04/04/2024. The patient tolerated her treatment fairly well except for occasional nausea and skin burns from radiation. She had repeat CT scan of the chest performed recently.  I personally independently reviewed the scan images and discussed the result with the patient and her family.  Her scan showed stable disease with evolving radiation changes in the left lower lobe. Assessment and Plan Assessment & Plan Stage 3C non-small cell lung cancer, adenocarcinoma, post-chemoradiation Recent CT scan shows stable disease with no new growth or spread. Completed chemoradiation with carboplatin  and paclitaxel . - Initiate immunotherapy next week with one infusion every four weeks for one year, provided there are no side effects and the cancer remains stable. - Discussed that immunotherapy is not chemotherapy and involves one infusion every four weeks for one year, provided there are no side effects and the cancer remains stable. - Explained that the goal is to prevent cancer growth, with potential for shrinkage. - Discussed potential side effects of immunotherapy, including increased immune activity leading to inflammation in various organs, rash, diarrhea, thyroid issues, and diabetes. - Emphasized regular monitoring during treatment. - Discussed that immunotherapy can be paused for other medical procedures if necessary. - Monitor for side effects and perform lab checks at each treatment visit. - Conduct CT scans every three months to assess disease status. - Coordinate with  cardiology for potential heart procedures, allowing flexibility in immunotherapy scheduling.  Chemotherapy-induced nausea No nausea reported in the past week, and anti-nausea medication was effective when used.   The patient voices understanding of current disease status and treatment options and is in agreement with the current care plan.  All questions were answered. The patient knows to call the clinic with any problems, questions or concerns. We can certainly see the patient much sooner if necessary.  The total time spent in the appointment was 30 minutes including review of chart and various tests results, discussions about plan of care and coordination of care plan .   Disclaimer: This note was dictated with voice recognition software. Similar sounding words can inadvertently be transcribed and may not be corrected upon review.

## 2024-05-09 NOTE — Progress Notes (Signed)
 DISCONTINUE ON PATHWAY REGIMEN - Non-Small Cell Lung     A cycle is every 7 days, concurrent with RT:     Paclitaxel       Carboplatin    **Always confirm dose/schedule in your pharmacy ordering system**  REASON: Continuation Of Treatment PRIOR TREATMENT: [id number]: Carboplatin  AUC=2 + Paclitaxel  45 mg/m2 Weekly During Radiation TREATMENT RESPONSE: Stable Disease (SD)  START ON PATHWAY REGIMEN - Non-Small Cell Lung     A cycle is every 28 days:     Durvalumab   **Always confirm dose/schedule in your pharmacy ordering system**  Patient Characteristics: Preoperative or Nonsurgical Candidate (Clinical Staging), Stage IIB (N2a only) or Stage III - Nonsurgical Candidate, PS = 0,1 Therapeutic Status: Preoperative or Nonsurgical Candidate (Clinical Staging) AJCC T Category: cT3 AJCC N Category: cN3 AJCC M Category: cM0 AJCC 9 Stage Grouping: IIIC Check here if patient was staged using an edition other than AJCC Staging 9th Edition: true ECOG Performance Status: 1 Intent of Therapy: Curative Intent, Discussed with Patient

## 2024-05-15 ENCOUNTER — Ambulatory Visit: Admitting: Nurse Practitioner

## 2024-05-15 ENCOUNTER — Encounter: Payer: Self-pay | Admitting: Nurse Practitioner

## 2024-05-15 VITALS — BP 132/82 | HR 61 | Wt 183.0 lb

## 2024-05-15 DIAGNOSIS — N1832 Chronic kidney disease, stage 3b: Secondary | ICD-10-CM

## 2024-05-15 DIAGNOSIS — L600 Ingrowing nail: Secondary | ICD-10-CM

## 2024-05-15 DIAGNOSIS — K8020 Calculus of gallbladder without cholecystitis without obstruction: Secondary | ICD-10-CM | POA: Insufficient documentation

## 2024-05-15 DIAGNOSIS — N200 Calculus of kidney: Secondary | ICD-10-CM

## 2024-05-15 DIAGNOSIS — C3432 Malignant neoplasm of lower lobe, left bronchus or lung: Secondary | ICD-10-CM

## 2024-05-15 DIAGNOSIS — E1165 Type 2 diabetes mellitus with hyperglycemia: Secondary | ICD-10-CM

## 2024-05-15 DIAGNOSIS — E1122 Type 2 diabetes mellitus with diabetic chronic kidney disease: Secondary | ICD-10-CM | POA: Diagnosis not present

## 2024-05-15 DIAGNOSIS — E785 Hyperlipidemia, unspecified: Secondary | ICD-10-CM | POA: Diagnosis not present

## 2024-05-15 DIAGNOSIS — I509 Heart failure, unspecified: Secondary | ICD-10-CM | POA: Diagnosis not present

## 2024-05-15 DIAGNOSIS — E559 Vitamin D deficiency, unspecified: Secondary | ICD-10-CM

## 2024-05-15 DIAGNOSIS — N1831 Chronic kidney disease, stage 3a: Secondary | ICD-10-CM

## 2024-05-15 DIAGNOSIS — J42 Unspecified chronic bronchitis: Secondary | ICD-10-CM | POA: Diagnosis not present

## 2024-05-15 DIAGNOSIS — D630 Anemia in neoplastic disease: Secondary | ICD-10-CM | POA: Diagnosis not present

## 2024-05-15 DIAGNOSIS — E1169 Type 2 diabetes mellitus with other specified complication: Secondary | ICD-10-CM | POA: Diagnosis not present

## 2024-05-15 DIAGNOSIS — I69351 Hemiplegia and hemiparesis following cerebral infarction affecting right dominant side: Secondary | ICD-10-CM | POA: Diagnosis not present

## 2024-05-15 DIAGNOSIS — I495 Sick sinus syndrome: Secondary | ICD-10-CM

## 2024-05-15 HISTORY — DX: Ingrowing nail: L60.0

## 2024-05-15 MED ORDER — ROSUVASTATIN CALCIUM 40 MG PO TABS
ORAL_TABLET | ORAL | 3 refills | Status: AC
Start: 2024-05-15 — End: ?

## 2024-05-15 MED ORDER — OLMESARTAN MEDOXOMIL 40 MG PO TABS: 40.0000 mg | ORAL_TABLET | Freq: Every morning | ORAL | 3 refills | Status: DC

## 2024-05-15 MED ORDER — AMLODIPINE BESYLATE 5 MG PO TABS: 5.0000 mg | ORAL_TABLET | Freq: Every day | ORAL | 3 refills | Status: DC

## 2024-05-15 MED ORDER — TRESIBA FLEXTOUCH 100 UNIT/ML ~~LOC~~ SOPN: PEN_INJECTOR | SUBCUTANEOUS | Status: AC

## 2024-05-15 MED ORDER — HUMALOG KWIKPEN 100 UNIT/ML ~~LOC~~ SOPN: 3.0000 [IU] | PEN_INJECTOR | Freq: Three times a day (TID) | SUBCUTANEOUS | Status: DC

## 2024-05-15 NOTE — Assessment & Plan Note (Addendum)
 No alarm symptoms present at this time.  No lower extremity edema noted.  Risks increased with comorbidities of DM, HTN, HLD, CKD, COPD, and Ca. Continue with furosemide  as directed.

## 2024-05-15 NOTE — Assessment & Plan Note (Signed)
 The ingrown toenail is painful but not infected. There is a need to prevent infection due to compromised immune system and chronic diabetes. Careful consideration for treatment.  - Refer to podiatry for toenail trimming and management

## 2024-05-15 NOTE — Assessment & Plan Note (Signed)
 Post-chemotherapy, the tumor has slightly shrunk and remains stable. The cancer is manageable but not curable. - Start immune-boosting treatment this week - Perform CT scan in a couple of months to monitor progress

## 2024-05-15 NOTE — Assessment & Plan Note (Signed)
 Gallstones are present without causing symptoms or blockage. Considered an incidental finding. - Monitor for symptoms of gallbladder attack

## 2024-05-15 NOTE — Assessment & Plan Note (Signed)
 Chronic. Stable. Currently managed with hydralazine , olmesartan, and amlodipine . No alarm symptoms present. Risks increased with comorbidities of DM, HLD, CHF, COPD, CKD, and Ca.

## 2024-05-15 NOTE — Assessment & Plan Note (Signed)
 Chronic. Stable. A1c monitored today with last reading of  Lab Results  Component Value Date   HGBA1C 7.3 (H) 01/10/2024  On novolog  and tresiba  for management. Condition is stable. Condition complicated by Hypertension, Hyperlipidemia, Chronic Kidney Disease, Congestive Heart Failure, Coronary Atherosclerosis, COPD, and History of CVA. Monitor closely for hypoglycemia or intolerance to medication. Reinforce diabetic diet, exercise (at least 150 min/wk), and increased hydration. Recommend annual foot exam, urine microalbumin, eye exam. Plan for routine follow-ups every 3 months.

## 2024-05-15 NOTE — Assessment & Plan Note (Addendum)
 Chronic. No worsening symptoms. Difficulty in obtaining urine specimen due to being WCB. Monitor closely. Risks increased with comorbidities of DM, HTN, HLD, CHF, COPD, CKD, and Ca

## 2024-05-15 NOTE — Assessment & Plan Note (Signed)
 Small stone noted in the left kidney on recent imaging. No symptoms at this time. Hematuria resolved. Monitor.

## 2024-05-15 NOTE — Assessment & Plan Note (Addendum)
 Chronically managed with albuterol  and Breztri .  Rhonchi are noted in the lungs bilaterally but very minimally in the bases.  No respiratory distress present. Risks increased with comorbidities of DM, HTN, HLD, CHF, CKD, and Ca.

## 2024-05-15 NOTE — Assessment & Plan Note (Signed)
 Chronic. Currently managed with rosuvastatin . No concerns present at this time. Labs pending today. Risks increased with comorbidities of DM, HTN, CHF, COPD, CKD, and Ca.

## 2024-05-15 NOTE — Assessment & Plan Note (Signed)
 No symptoms at this time. Any treatment delayed due to cancer treatment taking priority. Will continue to monitor closely.

## 2024-05-15 NOTE — Assessment & Plan Note (Addendum)
 Continue to monitor kidney function and increase water intake. Avoid dehydration. Risks increased with comorbidities of DM, HTN, HLD, CHF, COPD, and Ca.  Will request lab drawl at cancer center as they are able to access her port safely.

## 2024-05-15 NOTE — Progress Notes (Signed)
 Kelly Doing, DNP, AGNP-c [facility]  [address] [place], [place] [address] [phone]  ESTABLISHED PATIENT- Chronic Health and/or Follow-Up Visit on 05/15/2024  Blood pressure 132/82, pulse 61, weight 183 lb (83 kg), last menstrual period 12/10/2012.   HPI: History of Present Illness Kelly Gibson is a 63 year old female with cancer who presents for follow-up after chemotherapy treatment. She is accompanied by her spouse, Kelly Gibson.  She recently completed a course of chemotherapy on April 11, 2024, which resulted in some tumor shrinkage in the lung. She is scheduled to start a new treatment aimed at boosting her immune system on Thursday. The chemotherapy has caused alopecia.  Her blood sugar levels fluctuate, ranging from 110 to 130 mg/dL in the morning and sometimes rising to 200 mg/dL during the day, occasionally reaching 250 mg/dL, but coming back down steadily. She is not on steroids currently. She takes 22 units of Tresiba  in the morning and 10 units at night, with Novolog  on a sliding scale during meals.  Her appetite is reduced post-chemotherapy, consuming about 25% of her dinner, though she manages to eat her lunch fully. She prefers junk food over regular meals, which are less appealing post-treatment.   She experiences swelling and pain in her right foot, particularly around the big toe, possibly due to an ingrown toenail.  The toe is painful to touch, but no redness, drainage, or warmth.   She has bruising, attributed to Eliquis  use, and has noticed 'chemo burns' on her back and chest.  She experiences constipation, managed with Dulcolax, and has had hematuria, attributed to a kidney stone. Recent imaging showed a tiny left kidney stone, which has decreased in size, and gallstones without blockage.  She has a history of heart issues, including blockages with new pathways. She is concerned about her need for cardiovascular surgery. Her  focus remains on cancer treatment.  All ROS negative with exception of what is listed above.    PHYSICAL EXAM Physical Exam Vitals and nursing note reviewed.  Constitutional:      General: She is not in acute distress.    Appearance: Normal appearance. She is not ill-appearing.  HENT:     Head: Normocephalic.     Comments: Alopecia.  Eyes:     General: No scleral icterus.    Conjunctiva/sclera: Conjunctivae normal.     Pupils: Pupils are equal, round, and reactive to light.  Neck:     Vascular: No carotid bruit.  Cardiovascular:     Rate and Rhythm: Normal rate and regular rhythm.     Pulses: Normal pulses.     Heart sounds: Normal heart sounds. No murmur heard. Pulmonary:     Effort: Pulmonary effort is normal.     Breath sounds: Rhonchi present.  Abdominal:     General: Bowel sounds are normal. There is no distension.     Palpations: Abdomen is soft.     Tenderness: There is no abdominal tenderness.  Musculoskeletal:     Right lower leg: No edema.     Left lower leg: No edema.  Feet:     Comments: Great toenail is curled medially from both sides with pressure noted on the nailbed. No erythema, warmth, or drainage present.  Lymphadenopathy:     Cervical: No cervical adenopathy.  Skin:    General: Skin is warm and dry.     Capillary Refill: Capillary refill takes less than 2 seconds.  Neurological:     General: No focal deficit present.  Mental Status: She is alert and oriented to person, place, and time.     Motor: Weakness present.     Comments: WCB due to right side paralysis  Psychiatric:        Mood and Affect: Mood normal.     PLAN Problem List Items Addressed This Visit     COPD (chronic obstructive pulmonary disease) (HCC)   Chronically managed with albuterol  and Breztri .  Rhonchi are noted in the lungs bilaterally but very minimally in the bases.  No respiratory distress present. Risks increased with comorbidities of DM, HTN, HLD, CHF, CKD, and Ca.       Hemiparesis affecting right side as late effect of cerebrovascular accident (CVA) (HCC)   Chronic. No worsening symptoms. Difficulty in obtaining urine specimen due to being WCB. Monitor closely. Risks increased with comorbidities of DM, HTN, HLD, CHF, COPD, CKD, and Ca       CKD stage 3a, GFR 45-59 ml/min (HCC) - Baseline Scr 1.2-1.5   Continue to monitor kidney function and increase water intake. Avoid dehydration. Risks increased with comorbidities of DM, HTN, HLD, CHF, COPD, and Ca.  Will request lab drawl at cancer center as they are able to access her port safely.      Hyperlipidemia associated with type 2 diabetes mellitus (HCC)   Chronic. Currently managed with rosuvastatin . No concerns present at this time. Labs pending today. Risks increased with comorbidities of DM, HTN, CHF, COPD, CKD, and Ca.      Relevant Medications   insulin  degludec (TRESIBA  FLEXTOUCH) 100 UNIT/ML FlexTouch Pen   olmesartan (BENICAR) 40 MG tablet   rosuvastatin  (CRESTOR ) 40 MG tablet   HUMALOG  KWIKPEN 100 UNIT/ML KwikPen   amLODipine  (NORVASC ) 5 MG tablet   Hypertension associated with stage 3b chronic kidney disease due to type 2 diabetes mellitus (HCC)   Chronic. Stable. Currently managed with hydralazine , olmesartan, and amlodipine . No alarm symptoms present. Risks increased with comorbidities of DM, HLD, CHF, COPD, CKD, and Ca.      Relevant Medications   insulin  degludec (TRESIBA  FLEXTOUCH) 100 UNIT/ML FlexTouch Pen   olmesartan (BENICAR) 40 MG tablet   rosuvastatin  (CRESTOR ) 40 MG tablet   HUMALOG  KWIKPEN 100 UNIT/ML KwikPen   amLODipine  (NORVASC ) 5 MG tablet   Sick sinus syndrome (HCC)   No symptoms at this time. Any treatment delayed due to cancer treatment taking priority. Will continue to monitor closely.       Relevant Medications   olmesartan (BENICAR) 40 MG tablet   rosuvastatin  (CRESTOR ) 40 MG tablet   amLODipine  (NORVASC ) 5 MG tablet   Malignant neoplasm of lower lobe of left  lung (HCC)   Post-chemotherapy, the tumor has slightly shrunk and remains stable. The cancer is manageable but not curable. - Start immune-boosting treatment this week - Perform CT scan in a couple of months to monitor progress      Uncontrolled type 2 diabetes mellitus with hyperglycemia, with long-term current use of insulin  (HCC)   Chronic. Stable. A1c monitored today with last reading of  Lab Results  Component Value Date   HGBA1C 7.3 (H) 01/10/2024  On novolog  and tresiba  for management. Condition is stable. Condition complicated by Hypertension, Hyperlipidemia, Chronic Kidney Disease, Congestive Heart Failure, Coronary Atherosclerosis, COPD, and History of CVA. Monitor closely for hypoglycemia or intolerance to medication. Reinforce diabetic diet, exercise (at least 150 min/wk), and increased hydration. Recommend annual foot exam, urine microalbumin, eye exam. Plan for routine follow-ups every 3 months.  Relevant Medications   insulin  degludec (TRESIBA  FLEXTOUCH) 100 UNIT/ML FlexTouch Pen   olmesartan (BENICAR) 40 MG tablet   rosuvastatin  (CRESTOR ) 40 MG tablet   HUMALOG  KWIKPEN 100 UNIT/ML KwikPen   Congestive heart failure (HCC)   No alarm symptoms present at this time.  No lower extremity edema noted.  Risks increased with comorbidities of DM, HTN, HLD, CKD, COPD, and Ca. Continue with furosemide  as directed.      Relevant Medications   olmesartan (BENICAR) 40 MG tablet   rosuvastatin  (CRESTOR ) 40 MG tablet   amLODipine  (NORVASC ) 5 MG tablet   Nephrolithiasis   Small stone noted in the left kidney on recent imaging. No symptoms at this time. Hematuria resolved. Monitor.       Ingrown toenail of right foot   The ingrown toenail is painful but not infected. There is a need to prevent infection due to compromised immune system and chronic diabetes. Careful consideration for treatment.  - Refer to podiatry for toenail trimming and management       Relevant Orders    Ambulatory referral to Podiatry   Calculus of gallbladder without cholecystitis without obstruction   Gallstones are present without causing symptoms or blockage. Considered an incidental finding. - Monitor for symptoms of gallbladder attack      Other Visit Diagnoses       Type 2 diabetes mellitus with diabetic neuropathy, with long-term current use of insulin  (HCC)    -  Primary   Relevant Medications   insulin  degludec (TRESIBA  FLEXTOUCH) 100 UNIT/ML FlexTouch Pen   olmesartan (BENICAR) 40 MG tablet   rosuvastatin  (CRESTOR ) 40 MG tablet   HUMALOG  KWIKPEN 100 UNIT/ML KwikPen     Vitamin D  deficiency         Anemia in neoplastic disease             Return in about 3 months (around 08/15/2024).  SaraBeth Tonia Avino, DNP, AGNP-c Time: 53 minutes, >50% spent counseling, care coordination, chart review, and documentation.

## 2024-05-18 ENCOUNTER — Inpatient Hospital Stay

## 2024-05-18 VITALS — BP 140/56 | HR 58 | Temp 98.1°F | Resp 20 | Wt 172.8 lb

## 2024-05-18 DIAGNOSIS — Z5112 Encounter for antineoplastic immunotherapy: Secondary | ICD-10-CM | POA: Diagnosis not present

## 2024-05-18 DIAGNOSIS — C3432 Malignant neoplasm of lower lobe, left bronchus or lung: Secondary | ICD-10-CM

## 2024-05-18 LAB — CBC WITH DIFFERENTIAL (CANCER CENTER ONLY)
Abs Immature Granulocytes: 0.02 K/uL (ref 0.00–0.07)
Basophils Absolute: 0 K/uL (ref 0.0–0.1)
Basophils Relative: 1 %
Eosinophils Absolute: 0.2 K/uL (ref 0.0–0.5)
Eosinophils Relative: 3 %
HCT: 30.9 % — ABNORMAL LOW (ref 36.0–46.0)
Hemoglobin: 9.9 g/dL — ABNORMAL LOW (ref 12.0–15.0)
Immature Granulocytes: 0 %
Lymphocytes Relative: 8 %
Lymphs Abs: 0.4 K/uL — ABNORMAL LOW (ref 0.7–4.0)
MCH: 28 pg (ref 26.0–34.0)
MCHC: 32 g/dL (ref 30.0–36.0)
MCV: 87.3 fL (ref 80.0–100.0)
Monocytes Absolute: 0.6 K/uL (ref 0.1–1.0)
Monocytes Relative: 11 %
Neutro Abs: 3.8 K/uL (ref 1.7–7.7)
Neutrophils Relative %: 77 %
Platelet Count: 259 K/uL (ref 150–400)
RBC: 3.54 MIL/uL — ABNORMAL LOW (ref 3.87–5.11)
RDW: 21.2 % — ABNORMAL HIGH (ref 11.5–15.5)
WBC Count: 5 K/uL (ref 4.0–10.5)
nRBC: 0 % (ref 0.0–0.2)

## 2024-05-18 LAB — CMP (CANCER CENTER ONLY)
ALT: 7 U/L (ref 0–44)
AST: 13 U/L — ABNORMAL LOW (ref 15–41)
Albumin: 3.5 g/dL (ref 3.5–5.0)
Alkaline Phosphatase: 70 U/L (ref 38–126)
Anion gap: 7 (ref 5–15)
BUN: 17 mg/dL (ref 8–23)
CO2: 27 mmol/L (ref 22–32)
Calcium: 9.1 mg/dL (ref 8.9–10.3)
Chloride: 107 mmol/L (ref 98–111)
Creatinine: 1.38 mg/dL — ABNORMAL HIGH (ref 0.44–1.00)
GFR, Estimated: 44 mL/min — ABNORMAL LOW (ref >60)
Glucose, Bld: 177 mg/dL — ABNORMAL HIGH (ref 70–99)
Potassium: 3.9 mmol/L (ref 3.5–5.1)
Sodium: 141 mmol/L (ref 135–145)
Total Bilirubin: 0.4 mg/dL (ref 0.0–1.2)
Total Protein: 7 g/dL (ref 6.5–8.1)

## 2024-05-18 LAB — TSH: TSH: 1.37 u[IU]/mL (ref 0.350–4.500)

## 2024-05-18 MED ORDER — SODIUM CHLORIDE 0.9 % IV SOLN: INTRAVENOUS | Status: DC

## 2024-05-18 MED ORDER — SODIUM CHLORIDE 0.9% FLUSH
10.0000 mL | INTRAVENOUS | Status: DC | PRN
  Administered 2024-05-18: 10 mL

## 2024-05-18 MED ORDER — SODIUM CHLORIDE 0.9 % IV SOLN
1500.0000 mg | Freq: Once | INTRAVENOUS | Status: AC
  Administered 2024-05-18: 1500 mg via INTRAVENOUS
  Filled 2024-05-18: qty 30

## 2024-05-18 NOTE — Patient Instructions (Signed)
 [facility] - A DEPT OF [facility]  [facility]  Discharge Instructions: Thank you for choosing [facility] to provide your oncology and hematology care.   If you have a lab appointment with the Cancer Center, please go directly to the Cancer Center and check in at the registration area.   Wear comfortable clothing and clothing appropriate for easy access to any Portacath or PICC line.   We strive to give you quality time with your provider. You may need to reschedule your appointment if you arrive late (15 or more minutes).  Arriving late affects you and other patients whose appointments are after yours.  Also, if you miss three or more appointments without notifying the office, you may be dismissed from the clinic at the provider's discretion.      For prescription refill requests, have your pharmacy contact our office and allow 72 hours for refills to be completed.    Today you received the following chemotherapy and/or immunotherapy agents imfinzi       To help prevent nausea and vomiting after your treatment, we encourage you to take your nausea medication as directed.  BELOW ARE SYMPTOMS THAT SHOULD BE REPORTED IMMEDIATELY: *FEVER GREATER THAN 100.4 F (38 C) OR HIGHER *CHILLS OR SWEATING *NAUSEA AND VOMITING THAT IS NOT CONTROLLED WITH YOUR NAUSEA MEDICATION *UNUSUAL SHORTNESS OF BREATH *UNUSUAL BRUISING OR BLEEDING *URINARY PROBLEMS (pain or burning when urinating, or frequent urination) *BOWEL PROBLEMS (unusual diarrhea, constipation, pain near the anus) TENDERNESS IN MOUTH AND THROAT WITH OR WITHOUT PRESENCE OF ULCERS (sore throat, sores in mouth, or a toothache) UNUSUAL RASH, SWELLING OR PAIN  UNUSUAL VAGINAL DISCHARGE OR ITCHING   Items with * indicate a potential emergency and should be followed up as soon as possible or go to the Emergency Department if any problems should occur.  Please show the CHEMOTHERAPY ALERT CARD or IMMUNOTHERAPY  ALERT CARD at check-in to the Emergency Department and triage nurse.  Should you have questions after your visit or need to cancel or reschedule your appointment, please contact [facility] - [facility] DE[facility]  Dept: [phone]  and follow the prompts.  Office hours are 8:00 a.m. to 4:30 p.m. Monday - Friday. Please note that voicemails left after 4:00 p.m. may not be returned until the following business day.  We are closed weekends and major holidays. You have access to a nurse at all times for urgent questions. Please call the main number to the clinic Dept: [phone] and follow the prompts.   For any non-urgent questions, you may also contact your provider using MyChart. We now offer e-Visits for anyone 34 and older to request care online for non-urgent symptoms. For details visit [email or web address].   Also download the MyChart app! Go to the app store, search MyChart, open the app, select [facility], and log in with your MyChart username and password.

## 2024-05-19 DIAGNOSIS — J449 Chronic obstructive pulmonary disease, unspecified: Secondary | ICD-10-CM | POA: Diagnosis not present

## 2024-05-19 LAB — T4: T4, Total: 9.7 ug/dL (ref 4.5–12.0)

## 2024-05-23 ENCOUNTER — Ambulatory Visit (INDEPENDENT_AMBULATORY_CARE_PROVIDER_SITE_OTHER): Admitting: Podiatry

## 2024-05-23 VITALS — Ht 63.0 in | Wt 172.8 lb

## 2024-05-23 DIAGNOSIS — B351 Tinea unguium: Secondary | ICD-10-CM

## 2024-05-23 DIAGNOSIS — I739 Peripheral vascular disease, unspecified: Secondary | ICD-10-CM

## 2024-05-23 DIAGNOSIS — M79674 Pain in right toe(s): Secondary | ICD-10-CM | POA: Diagnosis not present

## 2024-05-23 DIAGNOSIS — G63 Polyneuropathy in diseases classified elsewhere: Secondary | ICD-10-CM | POA: Diagnosis not present

## 2024-05-23 DIAGNOSIS — M79675 Pain in left toe(s): Secondary | ICD-10-CM | POA: Diagnosis not present

## 2024-05-23 NOTE — Progress Notes (Signed)
  Subjective:  Patient ID: Kelly Gibson, female    DOB: Dec 09, 1960,  MRN: [id number]  Chief Complaint  Patient presents with   Toe Pain    Rm 2 Patient is here for pain in the right toes. Patient states pain has been present for two weeks. Pt states no injury to toes. Pt is concerned pain may be related to neuropathy or previous stroke that has affected the right side of the body.. (A1C 7.0).    63 y.o. female presents with the above complaint. History confirmed with patient.  She returns for follow-up describes burning tingling stinging pain on the smaller toes of the right foot.  Her nails are thickened elongated causing pain and discomfort as well.  Objective:  Physical Exam: Diminished peripheral pulses toes are cool to touch, capillary fill time is normal, thickened elongated mycotic dystrophic nails x 10 with pincer nail deformity bilateral hallux,  Assessment:   1. Pain due to onychomycosis of toenails of both feet   2. Polyneuropathy associated with underlying disease   3. PAD (peripheral artery disease)      Plan:  Patient was evaluated and treated and all questions answered.  We discussed the pain she is having likely secondary to peripheral neuropathy she also history of stroke as well.  This could could be contributing as well.  We discussed that the treatments for this can be limited and that likely would not be a good candidate for gabapentin or Lyrica.  Her PCP could consider using this at night for pain control at night only.  Also recommended noninvasive Vasser testing has diminished peripheral pulses vascular disease could be contributing as well.  Discussed the etiology and treatment options for the condition in detail with the patient. Recommended debridement of the nails today. Sharp and mechanical debridement performed of all painful and mycotic nails today. Nails debrided in length and thickness using a nail nipper to level of comfort. Follow up as needed for  painful nails.    Return in about 3 months (around 08/23/2024) for at risk diabetic foot care.

## 2024-05-26 ENCOUNTER — Encounter: Payer: Self-pay | Admitting: Nurse Practitioner

## 2024-06-01 ENCOUNTER — Other Ambulatory Visit: Payer: Self-pay

## 2024-06-01 DIAGNOSIS — E1165 Type 2 diabetes mellitus with hyperglycemia: Secondary | ICD-10-CM

## 2024-06-01 NOTE — Progress Notes (Addendum)
   06/01/2024 Name: Kelly Gibson MRN: [id number] DOB: 1961/04/22  Chief Complaint  Patient presents with   Medication Management   Diabetes    Kelly Gibson is a 63 y.o. year old female who presented for a telephone visit.   They were referred to the pharmacist by their PCP for assistance in managing diabetes, medication access, and complex medication management.    Subjective:  Care Team: Primary Care Provider: Oris [name], NP ; Next Scheduled Visit: 08/16/24  Medication Access/Adherence  Current Pharmacy:  [facility] [id number] - [place], [place] - [address] [place] [address] Phone: [phone] Fax: [phone]  [name] [address] [place] [address] Phone: [phone] Fax: [phone]   Patient reports affordability concerns with their medications: No -reports eliquis  expensive but managing Patient reports access/transportation concerns to their pharmacy: No  Patient reports adherence concerns with their medications:  No     Diabetes:  Current medications: Tresiba  22 units in morning, 10 units at night; Humalog  SS  Medications tried in the past: Tradjenta , Glyburide , Metformin   Current glucose readings:   Observed patterns:  Patient denies hypoglycemic s/sx including dizziness, shakiness, sweating. Patient denies hyperglycemic symptoms including polyuria, polydipsia, polyphagia, nocturia, neuropathy, blurred vision.   Current medication access support: Tresiba /Novolog /Pen Needles through [employer] PAP; Breztri  through [employer]   Objective:  Lab Results  Component Value Date   HGBA1C 7.3 (H) 01/10/2024    Lab Results  Component Value Date   CREATININE 1.38 (H) 05/18/2024   BUN 17 05/18/2024   NA 141 05/18/2024   K 3.9 05/18/2024   CL 107 05/18/2024   CO2 27 05/18/2024    Lab Results  Component Value Date   CHOL 159 01/10/2024   HDL 62 01/10/2024   LDLCALC  71 01/10/2024   LDLDIRECT 70 01/10/2024   TRIG 157 (H) 01/10/2024   CHOLHDL 2.9 01/30/2022    Medications Reviewed Today   Medications were not reviewed in this encounter       Assessment/Plan:   Diabetes: - Currently uncontrolled  - Reviewed long term cardiovascular and renal outcomes of uncontrolled blood sugar - Reviewed goal A1c, goal fasting, and goal 2 hour post prandial glucose - Recommend to continue current medication therapy. Consider insulin  adjustment or addition of SGLT2 if A1C still above 7 at next check with endo tmrw, Dr. Tommas managing -PAP renewal applications placed in mail at their request.   Follow Up Plan: 07/06/24  Jon VEAR Lindau, PharmD Clinical Pharmacist [phone]

## 2024-06-02 ENCOUNTER — Ambulatory Visit (HOSPITAL_COMMUNITY)
Admission: RE | Admit: 2024-06-02 | Discharge: 2024-06-02 | Disposition: A | Discharge status: Home / Self Care | Source: Ambulatory Visit | Attending: Podiatry | Admitting: Podiatry

## 2024-06-02 DIAGNOSIS — I739 Peripheral vascular disease, unspecified: Secondary | ICD-10-CM | POA: Diagnosis present

## 2024-06-04 LAB — VAS US ABI WITH/WO TBI
Left ABI: 1.24
Right ABI: 0.99

## 2024-06-12 ENCOUNTER — Telehealth: Payer: Self-pay | Admitting: Family Medicine

## 2024-06-12 NOTE — Telephone Encounter (Signed)
 Copied from CRM #[id number]. Topic: Clinical - Request for Lab/Test Order >> Jun 12, 2024  3:44 PM Amy B wrote: Reason for CRM: Patient's son requests lab orders to be sent to the Cancer Center to be done there.  Please advise.

## 2024-06-14 ENCOUNTER — Inpatient Hospital Stay: Admitting: Internal Medicine

## 2024-06-14 ENCOUNTER — Inpatient Hospital Stay

## 2024-06-14 ENCOUNTER — Inpatient Hospital Stay: Admitting: Dietician

## 2024-06-16 ENCOUNTER — Encounter: Payer: Self-pay | Admitting: Internal Medicine

## 2024-06-16 ENCOUNTER — Other Ambulatory Visit: Payer: Self-pay | Admitting: Physician Assistant

## 2024-06-16 DIAGNOSIS — C3432 Malignant neoplasm of lower lobe, left bronchus or lung: Secondary | ICD-10-CM

## 2024-06-20 ENCOUNTER — Inpatient Hospital Stay: Attending: Internal Medicine

## 2024-06-20 ENCOUNTER — Inpatient Hospital Stay

## 2024-06-20 ENCOUNTER — Other Ambulatory Visit: Payer: Self-pay | Admitting: Internal Medicine

## 2024-06-20 ENCOUNTER — Inpatient Hospital Stay: Admitting: Internal Medicine

## 2024-06-20 VITALS — BP 119/61 | HR 100 | Temp 97.6°F | Resp 17 | Ht 63.0 in | Wt 186.0 lb

## 2024-06-20 DIAGNOSIS — C3432 Malignant neoplasm of lower lobe, left bronchus or lung: Secondary | ICD-10-CM | POA: Diagnosis present

## 2024-06-20 DIAGNOSIS — C799 Secondary malignant neoplasm of unspecified site: Secondary | ICD-10-CM | POA: Insufficient documentation

## 2024-06-20 DIAGNOSIS — Z79620 Long term (current) use of immunosuppressive biologic: Secondary | ICD-10-CM | POA: Insufficient documentation

## 2024-06-20 DIAGNOSIS — Z5112 Encounter for antineoplastic immunotherapy: Secondary | ICD-10-CM | POA: Insufficient documentation

## 2024-06-20 DIAGNOSIS — R110 Nausea: Secondary | ICD-10-CM | POA: Diagnosis not present

## 2024-06-20 DIAGNOSIS — I2699 Other pulmonary embolism without acute cor pulmonale: Secondary | ICD-10-CM

## 2024-06-20 LAB — CBC WITH DIFFERENTIAL (CANCER CENTER ONLY)
Abs Immature Granulocytes: 0.01 K/uL (ref 0.00–0.07)
Basophils Absolute: 0 K/uL (ref 0.0–0.1)
Basophils Relative: 1 %
Eosinophils Absolute: 0.1 K/uL (ref 0.0–0.5)
Eosinophils Relative: 3 %
HCT: 33 % — ABNORMAL LOW (ref 36.0–46.0)
Hemoglobin: 10.6 g/dL — ABNORMAL LOW (ref 12.0–15.0)
Immature Granulocytes: 0 %
Lymphocytes Relative: 9 %
Lymphs Abs: 0.5 K/uL — ABNORMAL LOW (ref 0.7–4.0)
MCH: 28.5 pg (ref 26.0–34.0)
MCHC: 32.1 g/dL (ref 30.0–36.0)
MCV: 88.7 fL (ref 80.0–100.0)
Monocytes Absolute: 0.5 K/uL (ref 0.1–1.0)
Monocytes Relative: 10 %
Neutro Abs: 3.9 K/uL (ref 1.7–7.7)
Neutrophils Relative %: 77 %
Platelet Count: 265 K/uL (ref 150–400)
RBC: 3.72 MIL/uL — ABNORMAL LOW (ref 3.87–5.11)
RDW: 16.9 % — ABNORMAL HIGH (ref 11.5–15.5)
WBC Count: 5.1 K/uL (ref 4.0–10.5)
nRBC: 0 % (ref 0.0–0.2)

## 2024-06-20 LAB — CMP (CANCER CENTER ONLY)
ALT: 9 U/L (ref 0–44)
AST: 21 U/L (ref 15–41)
Albumin: 3.8 g/dL (ref 3.5–5.0)
Alkaline Phosphatase: 76 U/L (ref 38–126)
Anion gap: 9 (ref 5–15)
BUN: 18 mg/dL (ref 8–23)
CO2: 25 mmol/L (ref 22–32)
Calcium: 9.5 mg/dL (ref 8.9–10.3)
Chloride: 106 mmol/L (ref 98–111)
Creatinine: 1.38 mg/dL — ABNORMAL HIGH (ref 0.44–1.00)
GFR, Estimated: 43 mL/min — ABNORMAL LOW (ref >60)
Glucose, Bld: 139 mg/dL — ABNORMAL HIGH (ref 70–99)
Potassium: 4.3 mmol/L (ref 3.5–5.1)
Sodium: 140 mmol/L (ref 135–145)
Total Bilirubin: 0.4 mg/dL (ref 0.0–1.2)
Total Protein: 7.5 g/dL (ref 6.5–8.1)

## 2024-06-20 MED ORDER — SODIUM CHLORIDE 0.9 % IV SOLN
1500.0000 mg | Freq: Once | INTRAVENOUS | Status: AC
  Administered 2024-06-20: 1500 mg via INTRAVENOUS
  Filled 2024-06-20: qty 30

## 2024-06-20 MED ORDER — SODIUM CHLORIDE 0.9 % IV SOLN: INTRAVENOUS | Status: DC

## 2024-06-20 NOTE — Progress Notes (Signed)
 [facility] [phone]   [phone]  OFFICE PROGRESS NOTE  Early, [name], NP [address] [place] [address]  DIAGNOSIS: Stage IIIC (T3, N3, M0) NSCLC, adenocarcinoma. She presented with left lower lobe mass and intermittent activity with nonenlarged mediastinal lymph nodes.  She was diagnosed in June 2025.  Biomarker Findings HRD signature - Cannot Be Determined Microsatellite status - Cannot Be Determined ? Tumor Mutational Burden - Cannot Be Determined Genomic Findings For a complete list of the genes assayed, please refer to the Appendix. KRAS G12V 7 Disease relevant genes with no reportable alterations: ALK, BRAF, EGFR, ERBB2, MET, RET, ROS1     PRIOR THERAPY: Concurrent chemoradiation with weekly carboplatin  for an AUC of 2 and paclitaxel  45 mg/m.  First dose on 02/28/2024.  Status post 6 cycles.  Last dose was given on 04/04/2024.   CURRENT THERAPY: Consolidation treatment with immunotherapy with Imfinzi  1500 mg IV every 4 weeks.  First dose May 16, 2024.  Status post 1 cycle.  INTERVAL HISTORY: Kelly Gibson 63 y.o. female returns to the clinic today for follow-up visit accompanied by her husband.Discussed the use of AI scribe software for clinical note transcription with the patient, who gave verbal consent to proceed.  History of Present Illness Kelly Gibson is a 63 year old female who presents for evaluation for starting cycle number two of immunotherapy. She is accompanied by her husband.  She is currently undergoing immunotherapy, having completed one cycle, and is here for evaluation to start the second cycle. She feels a little bit tired from the treatment but notes it is easier than her previous chemotherapy and radiation treatments.  Her anemia is improving with the use of iron supplements, and she has been more active recently. She does not use supplemental oxygen at home.  She is preparing  for Thanksgiving and plans to cook for her family, although she mentions this will be her last year doing so.       MEDICAL HISTORY: Past Medical History:  Diagnosis Date   Acute pulmonary embolism without acute cor pulmonale (HCC) 01/13/2024   Bradycardia    SR/SB with up to 3.5 second pause (asymptomatic) on ~ 10/2017 event monitor; saw EP Dr. Kelsie: avoid AV nodal blocking agents   Chronic kidney disease    COPD (chronic obstructive pulmonary disease) (HCC)    Depression    Depression with anxiety 08/29/2021   Diabetes mellitus    Dysarthria, post-stroke    Dysrhythmia    Ganglion cyst 03/09/2012   Heart block AV complete (HCC) 12/11/2023   History of kidney stones    Hyperlipidemia    Hypertension    Hypokalemia    Myocardial infarction [facility])    NSTEMI   Non-ST elevation (NSTEMI) myocardial infarction (HCC) 12/11/2023   Obesity, unspecified 12/17/2011   Pneumonia    Pneumonia of left lower lobe due to infectious organism 12/12/2023   PONV (postoperative nausea and vomiting)    Skin lesion of face 11/17/2022   Stroke (HCC)    2019   right side hemiparesis    ALLERGIES:  is allergic to latex and niacin.  MEDICATIONS:  Current Outpatient Medications  Medication Sig Dispense Refill   albuterol  (PROVENTIL ) (2.5 MG/3ML) 0.083% nebulizer solution Inhale 3 mLs (2.5 mg total) into the lungs every 6 (six) hours as needed for wheezing or shortness of breath. 75 mL 12   albuterol  (VENTOLIN  HFA) 108 (90 Base) MCG/ACT inhaler Inhale  2 puffs into the lungs every 6 (six) hours as needed for wheezing or shortness of breath. 18 g 2   amLODipine  (NORVASC ) 5 MG tablet Take 1 tablet (5 mg total) by mouth daily. 90 tablet 3   aspirin  EC 81 MG tablet Take 1 tablet (81 mg total) by mouth daily. Swallow whole. 30 tablet 12   budesonide -glycopyrrolate -formoterol  (BREZTRI  AEROSPHERE) 160-9-4.8 MCG/ACT AERO inhaler Inhale 2 puffs into the lungs 2 (two) times daily. 10.7 g 1   cefadroxil   (DURICEF) 500 MG capsule Take 1 capsule (500 mg total) by mouth daily. (Patient not taking: Reported on 05/23/2024) 5 capsule 0   citalopram  (CELEXA ) 10 MG tablet TAKE 1 TABLET BY MOUTH DAILY 90 tablet 1   Continuous Glucose Sensor (DEXCOM G7 SENSOR) MISC Take 1 Device by mouth See admin instructions. Change sensor every 10 days     Doxylamine  Succinate, Sleep, (UNISOM  PO) Take 2 each by mouth at bedtime. Gel capsules     ELIQUIS  5 MG TABS tablet TAKE 1 TABLET BY MOUTH 2 TIMES A DAY 60 tablet 3   furosemide  (LASIX ) 20 MG tablet Take 1 tablet (20 mg total) by mouth daily as needed for fluid or edema (take if daily weight is greater than 2 lbs from the previous day's weight.). 90 tablet 0   hydrALAZINE  (APRESOLINE ) 100 MG tablet 1 tablet up to every 12 hours only as needed for blood pressure >150/90     insulin  aspart (NOVOLOG  FLEXPEN) 100 UNIT/ML FlexPen Inject 3-6 Units into the skin 3 (three) times daily with meals. Getting via NOVO PAP     insulin  degludec (TRESIBA  FLEXTOUCH) 100 UNIT/ML FlexTouch Pen 22u AM and 10u PM.     lidocaine -prilocaine  (EMLA ) cream Apply to the Port-A-Cath site 30-60 minutes before treatment 30 g 1   Melatonin 5 MG CHEW Chew 10 mg by mouth at bedtime.     Multiple Vitamins-Minerals (MULTIVITAMIN GUMMIES WOMENS) CHEW Chew 2 each by mouth in the morning.     olmesartan  (BENICAR ) 40 MG tablet Take 1 tablet (40 mg total) by mouth in the morning. 90 tablet 3   prochlorperazine  (COMPAZINE ) 10 MG tablet Take 1 tablet (10 mg total) by mouth every 6 (six) hours as needed. 30 tablet 2   rosuvastatin  (CRESTOR ) 40 MG tablet TAKE 1 TABLET BY MOUTH DAILY 90 tablet 3   UNABLE TO FIND Take 1 tablet by mouth with breakfast, with lunch, and with evening meal. GlucoGold -Berberine, Concentrated Cinnamon, Chromium, Banaba Leaf Extract     No current facility-administered medications for this visit.    SURGICAL HISTORY:  Past Surgical History:  Procedure Laterality Date   CESAREAN  SECTION     x 3   IR IMAGING GUIDED PORT INSERTION  03/09/2024   LEFT HEART CATH AND CORONARY ANGIOGRAPHY N/A 12/28/2023   Procedure: LEFT HEART CATH AND CORONARY ANGIOGRAPHY;  Surgeon: Elmira Newman PARAS, MD;  Location: [facility] INVASIVE CV LAB;  Service: Cardiovascular;  Laterality: N/A;   ORIF WRIST FRACTURE Right 05/04/2018   Procedure: OPEN REDUCTION INTERNAL FIXATION (ORIF) RIGHT  WRIST FRACTURE;  Surgeon: Cristy Bonner DASEN, MD;  Location: [facility] OR;  Service: Orthopedics;  Laterality: Right;   TUMOR REMOVAL     left shoulder   VIDEO BRONCHOSCOPY WITH ENDOBRONCHIAL NAVIGATION  01/03/2024   Procedure: VIDEO BRONCHOSCOPY WITH ENDOBRONCHIAL NAVIGATION;  Surgeon: Shelah Lamar RAMAN, MD;  Location: [facility] ENDOSCOPY;  Service: Pulmonary;;   VIDEO BRONCHOSCOPY WITH ENDOBRONCHIAL ULTRASOUND Bilateral 01/03/2024   Procedure: BRONCHOSCOPY, WITH EBUS;  Surgeon: Shelah Lamar RAMAN, MD;  Location: [facility] ENDOSCOPY;  Service: Pulmonary;  Laterality: Bilateral;    REVIEW OF SYSTEMS:  A comprehensive review of systems was negative except for: Constitutional: positive for fatigue Respiratory: positive for dyspnea on exertion   PHYSICAL EXAMINATION: General appearance: alert, cooperative, fatigued, and no distress Head: Normocephalic, without obvious abnormality, atraumatic Neck: no adenopathy, no JVD, supple, symmetrical, trachea midline, and thyroid  not enlarged, symmetric, no tenderness/mass/nodules Lymph nodes: Cervical, supraclavicular, and axillary nodes normal. Resp: clear to auscultation bilaterally Back: symmetric, no curvature. ROM normal. No CVA tenderness. Cardio: regular rate and rhythm, S1, S2 normal, no murmur, click, rub or gallop GI: soft, non-tender; bowel sounds normal; no masses,  no organomegaly Extremities: extremities normal, atraumatic, no cyanosis or edema  ECOG PERFORMANCE STATUS: 1 - Symptomatic but completely ambulatory  Blood pressure 119/61, pulse 100, temperature 97.6 F (36.4 C), temperature source  Temporal, resp. rate 17, height 5' 3 (1.6 m), weight 186 lb (84.4 kg), last menstrual period 12/10/2012, SpO2 91%.  LABORATORY DATA: Lab Results  Component Value Date   WBC 5.1 06/20/2024   HGB 10.6 (L) 06/20/2024   HCT 33.0 (L) 06/20/2024   MCV 88.7 06/20/2024   PLT 265 06/20/2024      Chemistry      Component Value Date/Time   NA 141 05/18/2024 1122   NA 144 01/10/2024 1508   K 3.9 05/18/2024 1122   CL 107 05/18/2024 1122   CO2 27 05/18/2024 1122   BUN 17 05/18/2024 1122   BUN 27 01/10/2024 1508   CREATININE 1.38 (H) 05/18/2024 1122   CREATININE 1.12 (H) 05/22/2020 1211      Component Value Date/Time   CALCIUM  9.1 05/18/2024 1122   CALCIUM  10.0 08/18/2023 0000   ALKPHOS 70 05/18/2024 1122   AST 13 (L) 05/18/2024 1122   ALT 7 05/18/2024 1122   BILITOT 0.4 05/18/2024 1122       RADIOGRAPHIC STUDIES: VAS US  ABI WITH/WO TBI Result Date: 06/04/2024  LOWER EXTREMITY DOPPLER STUDY Patient Name:  GLANDA SPANBAUER  Date of Exam:   06/02/2024 Medical Rec #: [id number]          Accession #:    [id number] Date of Birth: Jun 30, 1961         Patient Gender: F Patient Age:   19 years Exam Location:  [address] Procedure:      VAS US  ABI WITH/WO TBI Referring Phys: ADAM MCDONALD --------------------------------------------------------------------------------  Indications: Peripheral artery disease. High Risk Factors: Hyperlipidemia, Diabetes, past history of smoking, coronary                    artery disease.  Comparison Study: N/A Performing Technologist: Dena Pane  Examination Guidelines: A complete evaluation includes at minimum, Doppler waveform signals and systolic blood pressure reading at the level of bilateral brachial, anterior tibial, and posterior tibial arteries, when vessel segments are accessible. Bilateral testing is considered an integral part of a complete examination. Photoelectric Plethysmograph (PPG) waveforms and toe systolic pressure readings are included as  required and additional duplex testing as needed. Limited examinations for reoccurring indications may be performed as noted.  ABI Findings: +---------+------------------+-----+-----------+-------------------------------+ Right    Rt Pressure (mmHg)IndexWaveform   Comment                         +---------+------------------+-----+-----------+-------------------------------+ Brachial  Patient is unable to take BP in                                            right arm due to stroke         +---------+------------------+-----+-----------+-------------------------------+ PTA      142               0.99 multiphasic                                +---------+------------------+-----+-----------+-------------------------------+ DP       127               0.88 multiphasic                                +---------+------------------+-----+-----------+-------------------------------+ Great Toe77                0.53                                            +---------+------------------+-----+-----------+-------------------------------+ +---------+------------------+-----+-----------+-------+ Left     Lt Pressure (mmHg)IndexWaveform   Comment +---------+------------------+-----+-----------+-------+ Brachial 144                                       +---------+------------------+-----+-----------+-------+ PTA      178               1.24 multiphasic        +---------+------------------+-----+-----------+-------+ DP       118               0.82 multiphasic        +---------+------------------+-----+-----------+-------+ Great Toe107               0.74                    +---------+------------------+-----+-----------+-------+ +-------+-----------+-----------+------------+------------+ ABI/TBIToday's ABIToday's TBIPrevious ABIPrevious TBI +-------+-----------+-----------+------------+------------+ Right  0.99       0.53                                 +-------+-----------+-----------+------------+------------+ Left   1.24       0.74                                +-------+-----------+-----------+------------+------------+  Summary: Right: Resting right ankle-brachial index is within normal range. Right toe pressure is >60 mmHg which suggests adequate perfusion for healing.  Left: Resting left ankle-brachial index is within normal range. The left toe-brachial index is normal.  *See table(s) above for measurements and observations.  Electronically signed by Debby Robertson on 06/04/2024 at 2:22:15 PM.    Final     ASSESSMENT AND PLAN: This is a very pleasant 63 years old African-American female with Stage IIIC (T3, N3, M0) NSCLC, adenocarcinoma. She presented with left lower lobe mass and intermittent activity with nonenlarged mediastinal lymph nodes.  She was diagnosed in June 2025. Molecular studies showed no actionable mutations. The  patient underwent a course of concurrent chemoradiation with weekly carboplatin  for AUC of 2 and paclitaxel  45 Mg/M2 status post 6 cycles.  Last dose was given on 04/04/2024. The patient tolerated her treatment fairly well except for occasional nausea and skin burns from radiation. She is currently undergoing consolidation treatment with immunotherapy with Imfinzi  1500 mg IV every 4 weeks status post 1 cycle.  She tolerated the first cycle of her treatment fairly well. Assessment and Plan Assessment & Plan Metastatic cancer under active treatment Currently undergoing treatment with Rocephin  every four weeks. Status post one cycle, preparing for the second cycle. The goal is to maintain stable disease and prevent progression. Scans are performed every three months to assess treatment efficacy. The treatment aims to keep the cancer from growing, even if it does not shrink further. - Continue Rocephin  every four weeks - Perform scans every three months to assess treatment efficacy  Anemia  secondary to cancer therapy Anemia is improving with iron supplementation. Hemoglobin levels are not yet back to normal but are showing improvement. She has been on iron supplements for about a week. - Continue iron supplementation  Fatigue related to cancer treatment Experiencing fatigue, likely related to previous cancer treatments. Current treatment is perceived as easier than previous ones. Increased activity levels noted, indicating some improvement. - Encouraged continued physical activity as tolerated  Intermittent hypoxemia Oxygen saturation levels fluctuate between 85% and 91%. No current use of home oxygen therapy. Monitoring is necessary to determine if oxygen therapy becomes required. - Continue to monitor oxygen saturation levels - Will consider home oxygen therapy if saturation levels decrease further The patient was advised to call immediately if she has any other concerning symptoms in the interval. The patient voices understanding of current disease status and treatment options and is in agreement with the current care plan.  All questions were answered. The patient knows to call the clinic with any problems, questions or concerns. We can certainly see the patient much sooner if necessary.  The total time spent in the appointment was 20 minutes including review of chart and various tests results, discussions about plan of care and coordination of care plan .   Disclaimer: This note was dictated with voice recognition software. Similar sounding words can inadvertently be transcribed and may not be corrected upon review.

## 2024-06-20 NOTE — Patient Instructions (Signed)
 [facility] - A DEPT OF [facility]  [facility]  Discharge Instructions: Thank you for choosing [facility] to provide your oncology and hematology care.   If you have a lab appointment with the Cancer Center, please go directly to the Cancer Center and check in at the registration area.   Wear comfortable clothing and clothing appropriate for easy access to any Portacath or PICC line.   We strive to give you quality time with your provider. You may need to reschedule your appointment if you arrive late (15 or more minutes).  Arriving late affects you and other patients whose appointments are after yours.  Also, if you miss three or more appointments without notifying the office, you may be dismissed from the clinic at the provider's discretion.      For prescription refill requests, have your pharmacy contact our office and allow 72 hours for refills to be completed.    Today you received the following chemotherapy and/or immunotherapy agents imfinzi       To help prevent nausea and vomiting after your treatment, we encourage you to take your nausea medication as directed.  BELOW ARE SYMPTOMS THAT SHOULD BE REPORTED IMMEDIATELY: *FEVER GREATER THAN 100.4 F (38 C) OR HIGHER *CHILLS OR SWEATING *NAUSEA AND VOMITING THAT IS NOT CONTROLLED WITH YOUR NAUSEA MEDICATION *UNUSUAL SHORTNESS OF BREATH *UNUSUAL BRUISING OR BLEEDING *URINARY PROBLEMS (pain or burning when urinating, or frequent urination) *BOWEL PROBLEMS (unusual diarrhea, constipation, pain near the anus) TENDERNESS IN MOUTH AND THROAT WITH OR WITHOUT PRESENCE OF ULCERS (sore throat, sores in mouth, or a toothache) UNUSUAL RASH, SWELLING OR PAIN  UNUSUAL VAGINAL DISCHARGE OR ITCHING   Items with * indicate a potential emergency and should be followed up as soon as possible or go to the Emergency Department if any problems should occur.  Please show the CHEMOTHERAPY ALERT CARD or IMMUNOTHERAPY  ALERT CARD at check-in to the Emergency Department and triage nurse.  Should you have questions after your visit or need to cancel or reschedule your appointment, please contact [facility] - [facility] DE[facility]  Dept: [phone]  and follow the prompts.  Office hours are 8:00 a.m. to 4:30 p.m. Monday - Friday. Please note that voicemails left after 4:00 p.m. may not be returned until the following business day.  We are closed weekends and major holidays. You have access to a nurse at all times for urgent questions. Please call the main number to the clinic Dept: [phone] and follow the prompts.   For any non-urgent questions, you may also contact your provider using MyChart. We now offer e-Visits for anyone 34 and older to request care online for non-urgent symptoms. For details visit [email or web address].   Also download the MyChart app! Go to the app store, search MyChart, open the app, select [facility], and log in with your MyChart username and password.

## 2024-06-23 ENCOUNTER — Telehealth: Payer: Self-pay

## 2024-06-23 NOTE — Telephone Encounter (Signed)
 Received refill re-order request from [facility] on [facility]  filled and faxed to provider office  to sign and date.

## 2024-07-06 ENCOUNTER — Other Ambulatory Visit: Payer: Self-pay | Admitting: Nurse Practitioner

## 2024-07-06 ENCOUNTER — Other Ambulatory Visit

## 2024-07-06 DIAGNOSIS — F418 Other specified anxiety disorders: Secondary | ICD-10-CM

## 2024-07-06 NOTE — Telephone Encounter (Signed)
 Last appt 04/2024

## 2024-07-06 NOTE — Telephone Encounter (Signed)
 Received provider portion [employer] today faxed to [facility] today.

## 2024-07-12 ENCOUNTER — Inpatient Hospital Stay

## 2024-07-12 ENCOUNTER — Inpatient Hospital Stay: Admitting: Internal Medicine

## 2024-07-13 ENCOUNTER — Other Ambulatory Visit (INDEPENDENT_AMBULATORY_CARE_PROVIDER_SITE_OTHER)

## 2024-07-13 DIAGNOSIS — E1165 Type 2 diabetes mellitus with hyperglycemia: Secondary | ICD-10-CM

## 2024-07-13 DIAGNOSIS — Z794 Long term (current) use of insulin: Secondary | ICD-10-CM

## 2024-07-13 NOTE — Progress Notes (Unsigned)
 [facility] OFFICE PROGRESS NOTE  Gibson, Kelly BRAVO, NP [address] [place] [address]  DIAGNOSIS: Stage IIIC (T3, N3, M0) NSCLC, adenocarcinoma. She presented with left lower lobe mass and intermittent activity with nonenlarged mediastinal lymph nodes.  She was diagnosed in June 2025.   Biomarker Findings HRD signature - Cannot Be Determined Microsatellite status - Cannot Be Determined ? Tumor Mutational Burden - Cannot Be Determined Genomic Findings For a complete list of the genes assayed, please refer to the Appendix. KRAS G12V 7 Disease relevant genes with no reportable alterations: ALK, BRAF, EGFR, ERBB2, MET, RET, ROS1   PRIOR THERAPY: Concurrent chemoradiation with weekly carboplatin  for an AUC of 2 and paclitaxel  45 mg/m.  First dose on 02/28/2024.  Status post 6 cycles.  Last dose was given on 04/04/2024.   CURRENT THERAPY: Consolidation treatment with immunotherapy with Imfinzi  1500 mg IV every 4 weeks.  First dose May 16, 2024.  Status post 2 cycles.   INTERVAL HISTORY: Kelly Gibson 63 y.o. female returns clinic today for follow-up visit.  The patient was last seen in the clinic a month ago by Dr. Sherrod.  She is currently on consolidation immunotherapy with Imfinzi .  She is tolerating this ***.   She denies any fever, chills, night sweats, or unexplained weight loss.  She is currently on iron supplements for her anemia and her energy is ***.  Supplemental oxygen?  Shortness of breath?  Chest pain?  She denies any chest pain or hemoptysis.  Denies any nausea, vomiting, diarrhea, or constipation.  Denies any headache or visual changes.  Denies any rashes.  She is here today for evaluation and repeat blood work before undergoing cycle #3.  MEDICAL HISTORY: Past Medical History:  Diagnosis Date   Acute pulmonary embolism without acute cor pulmonale (HCC) 01/13/2024   Bradycardia    SR/SB with up to 3.5 second pause (asymptomatic) on ~ 10/2017  event monitor; saw EP Dr. Kelsie: avoid AV nodal blocking agents   Chronic kidney disease    COPD (chronic obstructive pulmonary disease) (HCC)    Depression    Depression with anxiety 08/29/2021   Diabetes mellitus    Dysarthria, post-stroke    Dysrhythmia    Ganglion cyst 03/09/2012   Heart block AV complete (HCC) 12/11/2023   History of kidney stones    Hyperlipidemia    Hypertension    Hypokalemia    Myocardial infarction [facility])    NSTEMI   Non-ST elevation (NSTEMI) myocardial infarction (HCC) 12/11/2023   Obesity, unspecified 12/17/2011   Pneumonia    Pneumonia of left lower lobe due to infectious organism 12/12/2023   PONV (postoperative nausea and vomiting)    Skin lesion of face 11/17/2022   Stroke (HCC)    2019   right side hemiparesis    ALLERGIES:  is allergic to latex and niacin.  MEDICATIONS:  Current Outpatient Medications  Medication Sig Dispense Refill   albuterol  (PROVENTIL ) (2.5 MG/3ML) 0.083% nebulizer solution Inhale 3 mLs (2.5 mg total) into the lungs every 6 (six) hours as needed for wheezing or shortness of breath. 75 mL 12   albuterol  (VENTOLIN  HFA) 108 (90 Base) MCG/ACT inhaler Inhale 2 puffs into the lungs every 6 (six) hours as needed for wheezing or shortness of breath. 18 g 2   amLODipine  (NORVASC ) 5 MG tablet Take 1 tablet (5 mg total) by mouth daily. 90 tablet 3   aspirin  EC 81 MG tablet Take 1 tablet (81 mg total) by mouth  daily. Swallow whole. 30 tablet 12   budesonide -glycopyrrolate -formoterol  (BREZTRI  AEROSPHERE) 160-9-4.8 MCG/ACT AERO inhaler Inhale 2 puffs into the lungs 2 (two) times daily. 10.7 g 1   cefadroxil  (DURICEF) 500 MG capsule Take 1 capsule (500 mg total) by mouth daily. (Patient not taking: Reported on 05/23/2024) 5 capsule 0   citalopram  (CELEXA ) 10 MG tablet TAKE 1 TABLET BY MOUTH DAILY 90 tablet 1   Continuous Glucose Sensor (DEXCOM G7 SENSOR) MISC Take 1 Device by mouth See admin instructions. Change sensor every 10 days      Doxylamine  Succinate, Sleep, (UNISOM  PO) Take 2 each by mouth at bedtime. Gel capsules     ELIQUIS  5 MG TABS tablet TAKE 1 TABLET BY MOUTH 2 TIMES A DAY 60 tablet 3   furosemide  (LASIX ) 20 MG tablet Take 1 tablet (20 mg total) by mouth daily as needed for fluid or edema (take if daily weight is greater than 2 lbs from the previous day's weight.). 90 tablet 0   hydrALAZINE  (APRESOLINE ) 100 MG tablet 1 tablet up to every 12 hours only as needed for blood pressure >150/90     insulin  aspart (NOVOLOG  FLEXPEN) 100 UNIT/ML FlexPen Inject 3-6 Units into the skin 3 (three) times daily with meals. Getting via NOVO PAP     insulin  degludec (TRESIBA  FLEXTOUCH) 100 UNIT/ML FlexTouch Pen 22u AM and 10u PM.     lidocaine -prilocaine  (EMLA ) cream Apply to the Port-A-Cath site 30-60 minutes before treatment 30 g 1   Melatonin 5 MG CHEW Chew 10 mg by mouth at bedtime.     Multiple Vitamins-Minerals (MULTIVITAMIN GUMMIES WOMENS) CHEW Chew 2 each by mouth in the morning.     olmesartan  (BENICAR ) 40 MG tablet Take 1 tablet (40 mg total) by mouth in the morning. 90 tablet 3   prochlorperazine  (COMPAZINE ) 10 MG tablet Take 1 tablet (10 mg total) by mouth every 6 (six) hours as needed. 30 tablet 2   rosuvastatin  (CRESTOR ) 40 MG tablet TAKE 1 TABLET BY MOUTH DAILY 90 tablet 3   UNABLE TO FIND Take 1 tablet by mouth with breakfast, with lunch, and with evening meal. GlucoGold -Berberine, Concentrated Cinnamon, Chromium, Banaba Leaf Extract     No current facility-administered medications for this visit.    SURGICAL HISTORY:  Past Surgical History:  Procedure Laterality Date   CESAREAN SECTION     x 3   IR IMAGING GUIDED PORT INSERTION  03/09/2024   LEFT HEART CATH AND CORONARY ANGIOGRAPHY N/A 12/28/2023   Procedure: LEFT HEART CATH AND CORONARY ANGIOGRAPHY;  Surgeon: Elmira Newman PARAS, MD;  Location: [facility] INVASIVE CV LAB;  Service: Cardiovascular;  Laterality: N/A;   ORIF WRIST FRACTURE Right 05/04/2018   Procedure:  OPEN REDUCTION INTERNAL FIXATION (ORIF) RIGHT  WRIST FRACTURE;  Surgeon: Cristy Bonner DASEN, MD;  Location: [facility] OR;  Service: Orthopedics;  Laterality: Right;   TUMOR REMOVAL     left shoulder   VIDEO BRONCHOSCOPY WITH ENDOBRONCHIAL NAVIGATION  01/03/2024   Procedure: VIDEO BRONCHOSCOPY WITH ENDOBRONCHIAL NAVIGATION;  Surgeon: Shelah Lamar RAMAN, MD;  Location: [facility] ENDOSCOPY;  Service: Pulmonary;;   VIDEO BRONCHOSCOPY WITH ENDOBRONCHIAL ULTRASOUND Bilateral 01/03/2024   Procedure: BRONCHOSCOPY, WITH EBUS;  Surgeon: Shelah Lamar RAMAN, MD;  Location: [facility] ENDOSCOPY;  Service: Pulmonary;  Laterality: Bilateral;    REVIEW OF SYSTEMS:   Review of Systems  Constitutional: Negative for appetite change, chills, fatigue, fever and unexpected weight change.  HENT:   Negative for mouth sores, nosebleeds, sore throat and trouble swallowing.  Eyes: Negative for eye problems and icterus.  Respiratory: Negative for cough, hemoptysis, shortness of breath and wheezing.   Cardiovascular: Negative for chest pain and leg swelling.  Gastrointestinal: Negative for abdominal pain, constipation, diarrhea, nausea and vomiting.  Genitourinary: Negative for bladder incontinence, difficulty urinating, dysuria, frequency and hematuria.   Musculoskeletal: Negative for back pain, gait problem, neck pain and neck stiffness.  Skin: Negative for itching and rash.  Neurological: Negative for dizziness, extremity weakness, gait problem, headaches, light-headedness and seizures.  Hematological: Negative for adenopathy. Does not bruise/bleed easily.  Psychiatric/Behavioral: Negative for confusion, depression and sleep disturbance. The patient is not nervous/anxious.     PHYSICAL EXAMINATION:  Last menstrual period 12/10/2012.  ECOG PERFORMANCE STATUS: {CHL ONC ECOG [id number]  Physical Exam  Constitutional: Oriented to person, place, and time and well-developed, well-nourished, and in no distress. No distress.  HENT:  Head:  Normocephalic and atraumatic.  Mouth/Throat: Oropharynx is clear and moist. No oropharyngeal exudate.  Eyes: Conjunctivae are normal. Right eye exhibits no discharge. Left eye exhibits no discharge. No scleral icterus.  Neck: Normal range of motion. Neck supple.  Cardiovascular: Normal rate, regular rhythm, normal heart sounds and intact distal pulses.   Pulmonary/Chest: Effort normal and breath sounds normal. No respiratory distress. No wheezes. No rales.  Abdominal: Soft. Bowel sounds are normal. Exhibits no distension and no mass. There is no tenderness.  Musculoskeletal: Normal range of motion. Exhibits no edema.  Lymphadenopathy:    No cervical adenopathy.  Neurological: Alert and oriented to person, place, and time. Exhibits normal muscle tone. Gait normal. Coordination normal.  Skin: Skin is warm and dry. No rash noted. Not diaphoretic. No erythema. No pallor.  Psychiatric: Mood, memory and judgment normal.  Vitals reviewed.  LABORATORY DATA: Lab Results  Component Value Date   WBC 5.1 06/20/2024   HGB 10.6 (L) 06/20/2024   HCT 33.0 (L) 06/20/2024   MCV 88.7 06/20/2024   PLT 265 06/20/2024      Chemistry      Component Value Date/Time   NA 140 06/20/2024 0937   NA 144 01/10/2024 1508   K 4.3 06/20/2024 0937   CL 106 06/20/2024 0937   CO2 25 06/20/2024 0937   BUN 18 06/20/2024 0937   BUN 27 01/10/2024 1508   CREATININE 1.38 (H) 06/20/2024 0937   CREATININE 1.12 (H) 05/22/2020 1211      Component Value Date/Time   CALCIUM  9.5 06/20/2024 0937   CALCIUM  10.0 08/18/2023 0000   ALKPHOS 76 06/20/2024 0937   AST 21 06/20/2024 0937   ALT 9 06/20/2024 0937   BILITOT 0.4 06/20/2024 0937       RADIOGRAPHIC STUDIES:  No results found.   ASSESSMENT/PLAN:  This is a very pleasant 63 year old female with stage III (T3, N3, M0) non small cell lung cancer, adenocarinoma. She presented with left lower lobe lung mass and mediastinal lymph nodes.  She was diagnosed in June  2025.  Molecular studies show no actionable mutations.  She completed a course of concurrent chemoradiation with weekly carboplatin  for an AUC of 2 and paclitaxel  45 mg/m.  She is status post 6 cycles.  Her last dose was given on 04/04/2024.  She is currently on consolidation immunotherapy with Imfinzi  1500 mg IV every 4 weeks.  She is status post 2 cycles.  Labs were reviewed.  Recommend that she ***cycle #3 today scheduled.  We will see her back for follow-up visit in 4 weeks before undergoing cycle #4.  I will  arrange for restaging CT scan of the chest prior to her next cycle of treatment.  Iron supplements and anemia?  Oxygen?  The patient was advised to call immediately if she has any concerning symptoms in the interval. The patient voices understanding of current disease status and treatment options and is in agreement with the current care plan. All questions were answered. The patient knows to call the clinic with any problems, questions or concerns. We can certainly see the patient much sooner if necessary       No orders of the defined types were placed in this encounter.    I spent {CHL ONC TIME VISIT - DTPQU:[id number]} counseling the patient face to face. The total time spent in the appointment was {CHL ONC TIME VISIT - DTPQU:[id number]}.  Dak Szumski L Anala Whisenant, PA-C 07/13/2024

## 2024-07-13 NOTE — Progress Notes (Cosign Needed)
 07/13/2024 Name: Kelly Gibson MRN: [id number] DOB: August 27, 1960  Chief Complaint  Patient presents with   Medication Management   Diabetes    Kelly Gibson is a 64 y.o. year old female who presented for a telephone visit.   They were referred to the pharmacist by their PCP for assistance in managing diabetes, medication access, and complex medication management.    Subjective:  Gibson Team: Primary Gibson Provider: Oris [name], NP ; Next Scheduled Visit: 08/16/24  Medication Access/Adherence  Current Pharmacy:  [facility] [id number] - [place], [place] - [address] [facility] [address] [place] [address] Phone: [phone] Fax: [phone]  [facility] [address] [place] [address] Phone: [phone] Fax: [phone]   Patient reports affordability concerns with their medications: No -reports eliquis  expensive but managing Patient reports access/transportation concerns to their pharmacy: No  Patient reports adherence concerns with their medications:  No     Diabetes:  Current medications: Tresiba  22 units in morning, 10 units at night; Humalog  SS  Medications tried in the past: Tradjenta , Glyburide , Metformin   Current glucose readings:   Observed patterns:  Patient denies hypoglycemic s/sx including dizziness, shakiness, sweating. Patient denies hyperglycemic symptoms including polyuria, polydipsia, polyphagia, nocturia, neuropathy, blurred vision.   Current medication access support: Tresiba /Novolog /Pen Needles through NOVO PAP; Breztri  through [employer]   Objective:  Lab Results  Component Value Date   HGBA1C 7.3 (H) 01/10/2024    Lab Results  Component Value Date   CREATININE 1.38 (H) 06/20/2024   BUN 18 06/20/2024   NA 140 06/20/2024   K 4.3 06/20/2024   CL 106 06/20/2024   CO2 25 06/20/2024    Lab Results  Component Value Date   CHOL 159 01/10/2024   HDL 62 01/10/2024   LDLCALC  71 01/10/2024   LDLDIRECT 70 01/10/2024   TRIG 157 (H) 01/10/2024   CHOLHDL 2.9 01/30/2022    Medications Reviewed Today     Reviewed by Kelly Gibson, RPH (Pharmacist) on 07/13/24 at 1624  Med List Status: <None>   Medication Order Taking? Sig Documenting Provider Last Dose Status Informant  albuterol  (PROVENTIL ) (2.5 MG/3ML) 0.083% nebulizer solution [id number]  Inhale 3 mLs (2.5 mg total) into the lungs every 6 (six) hours as needed for wheezing or shortness of breath. Kelly Pore, MD  Active Spouse/Significant Other, Pharmacy Records  albuterol  (VENTOLIN  HFA) [phone] Base) MCG/ACT inhaler [id number]  Inhale 2 puffs into the lungs every 6 (six) hours as needed for wheezing or shortness of breath. Kelly DELENA Meliton Mickey., MD  Active Spouse/Significant Other, Pharmacy Records  amLODipine  (NORVASC ) 5 MG tablet [id number]  Take 1 tablet (5 mg total) by mouth daily. Gibson, Kelly E, NP  Active   aspirin  EC 81 MG tablet [id number]  Take 1 tablet (81 mg total) by mouth daily. Swallow whole. Kelly DELENA Meliton Mickey., MD  Active Spouse/Significant Other, Pharmacy Records  budesonide -glycopyrrolate -formoterol  (BREZTRI  AEROSPHERE) 160-9-4.8 MCG/ACT AERO inhaler [id number]  Inhale 2 puffs into the lungs 2 (two) times daily. Kelly DELENA Meliton Mickey., MD  Active Spouse/Significant Other, Pharmacy Records  cefadroxil  (DURICEF) 500 MG capsule [id number]  Take 1 capsule (500 mg total) by mouth daily.  Patient not taking: Reported on 05/23/2024   Kelly [name], NP  Active            Med Note Kelly Gibson [place] Mar 09, 2024 11:53 AM) Completed  citalopram  (CELEXA ) 10 MG tablet [id number]  TAKE 1 TABLET BY MOUTH DAILY Gibson, Kelly E, NP  Active   Continuous Glucose Sensor (DEXCOM G7 SENSOR) MISC [id number]  Take 1 Device by mouth See admin instructions. Change sensor every 10 days [provider]  Active Spouse/Significant Other, Pharmacy Records  Doxylamine  Succinate, Sleep, (UNISOM  PO) [id number]   Take 2 each by mouth at bedtime. Gel capsules [provider]  Active Spouse/Significant Other, Pharmacy Records  ELIQUIS  5 MG TABS tablet [id number]  TAKE 1 TABLET BY MOUTH 2 TIMES A DAY Kelly Gibson, Mohamed, MD  Active   furosemide  (LASIX ) 20 MG tablet [id number]  Take 1 tablet (20 mg total) by mouth daily as needed for fluid or edema (take if daily weight is greater than 2 lbs from the previous day's weight.). Kelly Locus, DO  Expired 05/23/24 2359   hydrALAZINE  (APRESOLINE ) 100 MG tablet [id number]  1 tablet up to every 12 hours only as needed for blood pressure >150/90 Gibson, Kelly E, NP  Active Spouse/Significant Other, Pharmacy Records  insulin  aspart (NOVOLOG  FLEXPEN) 100 UNIT/ML FlexPen [id number]  Inject 3-6 Units into the skin 3 (three) times daily with meals. Getting via NOVO PAP [provider]  Active   insulin  degludec (TRESIBA  FLEXTOUCH) 100 UNIT/ML FlexTouch Pen [id number]  22u AM and 10u PM. Gibson, Kelly E, NP  Active   lidocaine -prilocaine  (EMLA ) cream [id number]  Apply to the Port-A-Cath site 30-60 minutes before treatment Kelly Sherrod, MD  Active   Melatonin 5 MG CHEW [id number]  Chew 10 mg by mouth at bedtime. [provider]  Active Spouse/Significant Other, Pharmacy Records  Multiple Vitamins-Minerals (MULTIVITAMIN GUMMIES WOMENS) CICERO [id number]  Chew 2 each by mouth in the morning. [provider]  Active Spouse/Significant Other, Pharmacy Records  olmesartan  (BENICAR ) 40 MG tablet [id number]  Take 1 tablet (40 mg total) by mouth in the morning. Gibson, Kelly E, NP  Active   prochlorperazine  (COMPAZINE ) 10 MG tablet [id number]  Take 1 tablet (10 mg total) by mouth every 6 (six) hours as needed. Gibson, Kelly L, PA-C  Active   rosuvastatin  (CRESTOR ) 40 MG tablet [id number]  TAKE 1 TABLET BY MOUTH DAILY Gibson, Kelly E, NP  Active   UNABLE TO FIND [id number]  Take 1 tablet by mouth with breakfast, with lunch, and with evening meal. GlucoGold -Berberine,  Concentrated Cinnamon, Chromium, Banaba Leaf Extract [provider]  Active Spouse/Significant Other, Pharmacy Records  Med List Note Steffi Nian, CPhT 12/23/23 1134): Husband manages medications               Assessment/Plan:   Diabetes: - Currently uncontrolled but improving - Reviewed long term cardiovascular and renal outcomes of uncontrolled blood sugar - Reviewed goal A1c, goal fasting, and goal 2 hour post prandial glucose - Recommend to continue current medication therapy. Consider insulin  adjustment or addition of SGLT2 if A1C still above 7 at next check with endo tmrw, Dr. Tommas managing -PAP renewal processing   Follow Up Plan: 2 months  Jon VEAR Lindau, PharmD Clinical Pharmacist [phone]

## 2024-07-14 NOTE — Telephone Encounter (Signed)
 Received [employer] (Breztri ) approval letter thru 07/26/2025 approval letter index

## 2024-07-17 ENCOUNTER — Other Ambulatory Visit (HOSPITAL_COMMUNITY): Payer: Self-pay

## 2024-07-17 NOTE — Telephone Encounter (Signed)
 Received approval letter from [employer] on Tresiba  and pen needles approval letter index.

## 2024-07-18 ENCOUNTER — Inpatient Hospital Stay: Attending: Internal Medicine

## 2024-07-18 ENCOUNTER — Telehealth: Payer: Self-pay | Admitting: Physician Assistant

## 2024-07-18 ENCOUNTER — Inpatient Hospital Stay

## 2024-07-18 ENCOUNTER — Inpatient Hospital Stay (HOSPITAL_BASED_OUTPATIENT_CLINIC_OR_DEPARTMENT_OTHER): Admitting: Physician Assistant

## 2024-07-18 VITALS — BP 123/58 | HR 64 | Temp 97.9°F | Resp 17 | Wt 184.9 lb

## 2024-07-18 DIAGNOSIS — C3432 Malignant neoplasm of lower lobe, left bronchus or lung: Secondary | ICD-10-CM

## 2024-07-18 DIAGNOSIS — Z5112 Encounter for antineoplastic immunotherapy: Secondary | ICD-10-CM | POA: Insufficient documentation

## 2024-07-18 DIAGNOSIS — R319 Hematuria, unspecified: Secondary | ICD-10-CM | POA: Diagnosis not present

## 2024-07-18 DIAGNOSIS — Z79620 Long term (current) use of immunosuppressive biologic: Secondary | ICD-10-CM | POA: Insufficient documentation

## 2024-07-18 DIAGNOSIS — Z79899 Other long term (current) drug therapy: Secondary | ICD-10-CM | POA: Insufficient documentation

## 2024-07-18 DIAGNOSIS — Z7901 Long term (current) use of anticoagulants: Secondary | ICD-10-CM | POA: Insufficient documentation

## 2024-07-18 DIAGNOSIS — N200 Calculus of kidney: Secondary | ICD-10-CM | POA: Insufficient documentation

## 2024-07-18 DIAGNOSIS — C771 Secondary and unspecified malignant neoplasm of intrathoracic lymph nodes: Secondary | ICD-10-CM | POA: Insufficient documentation

## 2024-07-18 LAB — CBC WITH DIFFERENTIAL (CANCER CENTER ONLY)
Abs Immature Granulocytes: 0.01 K/uL (ref 0.00–0.07)
Basophils Absolute: 0 K/uL (ref 0.0–0.1)
Basophils Relative: 1 %
Eosinophils Absolute: 0.2 K/uL (ref 0.0–0.5)
Eosinophils Relative: 4 %
HCT: 33.1 % — ABNORMAL LOW (ref 36.0–46.0)
Hemoglobin: 10.7 g/dL — ABNORMAL LOW (ref 12.0–15.0)
Immature Granulocytes: 0 %
Lymphocytes Relative: 13 %
Lymphs Abs: 0.6 K/uL — ABNORMAL LOW (ref 0.7–4.0)
MCH: 28.1 pg (ref 26.0–34.0)
MCHC: 32.3 g/dL (ref 30.0–36.0)
MCV: 86.9 fL (ref 80.0–100.0)
Monocytes Absolute: 0.4 K/uL (ref 0.1–1.0)
Monocytes Relative: 9 %
Neutro Abs: 3.4 K/uL (ref 1.7–7.7)
Neutrophils Relative %: 73 %
Platelet Count: 242 K/uL (ref 150–400)
RBC: 3.81 MIL/uL — ABNORMAL LOW (ref 3.87–5.11)
RDW: 15.8 % — ABNORMAL HIGH (ref 11.5–15.5)
WBC Count: 4.6 K/uL (ref 4.0–10.5)
nRBC: 0 % (ref 0.0–0.2)

## 2024-07-18 LAB — CMP (CANCER CENTER ONLY)
ALT: 9 U/L (ref 0–44)
AST: 20 U/L (ref 15–41)
Albumin: 4 g/dL (ref 3.5–5.0)
Alkaline Phosphatase: 83 U/L (ref 38–126)
Anion gap: 11 (ref 5–15)
BUN: 21 mg/dL (ref 8–23)
CO2: 24 mmol/L (ref 22–32)
Calcium: 9.4 mg/dL (ref 8.9–10.3)
Chloride: 107 mmol/L (ref 98–111)
Creatinine: 1.81 mg/dL — ABNORMAL HIGH (ref 0.44–1.00)
GFR, Estimated: 31 mL/min — ABNORMAL LOW
Glucose, Bld: 188 mg/dL — ABNORMAL HIGH (ref 70–99)
Potassium: 3.6 mmol/L (ref 3.5–5.1)
Sodium: 142 mmol/L (ref 135–145)
Total Bilirubin: 0.2 mg/dL (ref 0.0–1.2)
Total Protein: 7.5 g/dL (ref 6.5–8.1)

## 2024-07-18 LAB — URINALYSIS, COMPLETE (UACMP) WITH MICROSCOPIC
Bilirubin Urine: NEGATIVE
Glucose, UA: NEGATIVE mg/dL
Ketones, ur: NEGATIVE mg/dL
Nitrite: NEGATIVE
Protein, ur: 100 mg/dL — AB
RBC / HPF: >50 RBC/hpf (ref 0–5)
Specific Gravity, Urine: 1.011 (ref 1.005–1.030)
pH: 6 (ref 5.0–8.0)

## 2024-07-18 LAB — TSH: TSH: 1.71 u[IU]/mL (ref 0.350–4.500)

## 2024-07-18 MED ORDER — SODIUM CHLORIDE 0.9 % IV SOLN: Freq: Once | INTRAVENOUS | Status: DC

## 2024-07-18 MED ORDER — SODIUM CHLORIDE 0.9 % IV SOLN
1500.0000 mg | Freq: Once | INTRAVENOUS | Status: AC
  Administered 2024-07-18: 1500 mg via INTRAVENOUS
  Filled 2024-07-18: qty 30

## 2024-07-18 MED ORDER — SODIUM CHLORIDE 0.9 % IV SOLN: INTRAVENOUS | Status: DC

## 2024-07-18 MED ORDER — SODIUM CHLORIDE 0.9 % IV SOLN: Freq: Once | INTRAVENOUS | Status: AC

## 2024-07-18 NOTE — Telephone Encounter (Signed)
 Left VM

## 2024-07-18 NOTE — Patient Instructions (Addendum)
 [facility] - A DEPT OF [place]  [facility]  Discharge Instructions: Thank you for choosing [facility] to provide your oncology and hematology care.   If you have a lab appointment with the Cancer Center, please go directly to the Cancer Center and check in at the registration area.   Wear comfortable clothing and clothing appropriate for easy access to any Portacath or PICC line.   We strive to give you quality time with your provider. You may need to reschedule your appointment if you arrive late (15 or more minutes).  Arriving late affects you and other patients whose appointments are after yours.  Also, if you miss three or more appointments without notifying the office, you may be dismissed from the clinic at the provider's discretion.      For prescription refill requests, have your pharmacy contact our office and allow 72 hours for refills to be completed.    Today you received the following chemotherapy and/or immunotherapy agents imfinzi       To help prevent nausea and vomiting after your treatment, we encourage you to take your nausea medication as directed.  BELOW ARE SYMPTOMS THAT SHOULD BE REPORTED IMMEDIATELY: *FEVER GREATER THAN 100.4 F (38 C) OR HIGHER *CHILLS OR SWEATING *NAUSEA AND VOMITING THAT IS NOT CONTROLLED WITH YOUR NAUSEA MEDICATION *UNUSUAL SHORTNESS OF BREATH *UNUSUAL BRUISING OR BLEEDING *URINARY PROBLEMS (pain or burning when urinating, or frequent urination) *BOWEL PROBLEMS (unusual diarrhea, constipation, pain near the anus) TENDERNESS IN MOUTH AND THROAT WITH OR WITHOUT PRESENCE OF ULCERS (sore throat, sores in mouth, or a toothache) UNUSUAL RASH, SWELLING OR PAIN  UNUSUAL VAGINAL DISCHARGE OR ITCHING   Items with * indicate a potential emergency and should be followed up as soon as possible or go to the Emergency Department if any problems should occur.  Please show the CHEMOTHERAPY ALERT CARD or IMMUNOTHERAPY  ALERT CARD at check-in to the Emergency Department and triage nurse.  Should you have questions after your visit or need to cancel or reschedule your appointment, please contact [facility] - A DEPT [facility]  Dept: [phone]  and follow the prompts.  Office hours are 8:00 a.m. to 4:30 p.m. Monday - Friday. Please note that voicemails left after 4:00 p.m. may not be returned until the following business day.  We are closed weekends and major holidays. You have access to a nurse at all times for urgent questions. Please call the main number to the clinic Dept: [phone] and follow the prompts.   For any non-urgent questions, you may also contact your provider using MyChart. We now offer e-Visits for anyone 34 and older to request care online for non-urgent symptoms. For details visit [email or web address].   Also download the MyChart app! Go to the app store, search MyChart, open the app, select [facility], and log in with your MyChart username and password.

## 2024-07-19 ENCOUNTER — Other Ambulatory Visit: Payer: Self-pay | Admitting: Physician Assistant

## 2024-07-19 ENCOUNTER — Telehealth: Payer: Self-pay

## 2024-07-19 DIAGNOSIS — N390 Urinary tract infection, site not specified: Secondary | ICD-10-CM

## 2024-07-19 LAB — URINE CULTURE: Culture: NO GROWTH

## 2024-07-19 LAB — T4: T4, Total: 9.9 ug/dL (ref 4.5–12.0)

## 2024-07-19 MED ORDER — AMOXICILLIN-POT CLAVULANATE 875-125 MG PO TABS: 1.0000 | ORAL_TABLET | Freq: Two times a day (BID) | ORAL | 0 refills | Status: DC

## 2024-07-19 NOTE — Telephone Encounter (Signed)
 Spoke with patient regarding UA results. Per Cassie, PA, it appears the patient has a UTI. Antibiotics have been sent to the pharmacy. Informed the patient to pick up the prescription today and begin taking as directed. Advised that after completing the course of antibiotics, if symptoms persist, she should contact her urologist. The patient voiced understanding of instructions.

## 2024-07-21 ENCOUNTER — Inpatient Hospital Stay (HOSPITAL_COMMUNITY)

## 2024-07-21 ENCOUNTER — Inpatient Hospital Stay (HOSPITAL_COMMUNITY)
Admission: EM | Admit: 2024-07-21 | Discharge: 2024-07-24 | DRG: 871 | Disposition: A | Discharge status: Home / Self Care | Attending: Internal Medicine | Admitting: Internal Medicine

## 2024-07-21 ENCOUNTER — Encounter (HOSPITAL_COMMUNITY): Payer: Self-pay | Admitting: Hospitalist

## 2024-07-21 ENCOUNTER — Other Ambulatory Visit: Payer: Self-pay

## 2024-07-21 ENCOUNTER — Emergency Department (HOSPITAL_COMMUNITY)

## 2024-07-21 DIAGNOSIS — I2510 Atherosclerotic heart disease of native coronary artery without angina pectoris: Secondary | ICD-10-CM | POA: Diagnosis present

## 2024-07-21 DIAGNOSIS — C3432 Malignant neoplasm of lower lobe, left bronchus or lung: Secondary | ICD-10-CM | POA: Diagnosis present

## 2024-07-21 DIAGNOSIS — I69351 Hemiplegia and hemiparesis following cerebral infarction affecting right dominant side: Secondary | ICD-10-CM

## 2024-07-21 DIAGNOSIS — A419 Sepsis, unspecified organism: Secondary | ICD-10-CM | POA: Diagnosis present

## 2024-07-21 DIAGNOSIS — Z7982 Long term (current) use of aspirin: Secondary | ICD-10-CM | POA: Diagnosis not present

## 2024-07-21 DIAGNOSIS — R1310 Dysphagia, unspecified: Secondary | ICD-10-CM | POA: Diagnosis present

## 2024-07-21 DIAGNOSIS — Z794 Long term (current) use of insulin: Secondary | ICD-10-CM

## 2024-07-21 DIAGNOSIS — N179 Acute kidney failure, unspecified: Secondary | ICD-10-CM | POA: Diagnosis present

## 2024-07-21 DIAGNOSIS — Z1152 Encounter for screening for COVID-19: Secondary | ICD-10-CM | POA: Diagnosis not present

## 2024-07-21 DIAGNOSIS — N1832 Chronic kidney disease, stage 3b: Secondary | ICD-10-CM | POA: Diagnosis present

## 2024-07-21 DIAGNOSIS — Z79899 Other long term (current) drug therapy: Secondary | ICD-10-CM

## 2024-07-21 DIAGNOSIS — Z7901 Long term (current) use of anticoagulants: Secondary | ICD-10-CM

## 2024-07-21 DIAGNOSIS — R55 Syncope and collapse: Secondary | ICD-10-CM | POA: Diagnosis present

## 2024-07-21 DIAGNOSIS — Z79620 Long term (current) use of immunosuppressive biologic: Secondary | ICD-10-CM

## 2024-07-21 DIAGNOSIS — I69391 Dysphagia following cerebral infarction: Secondary | ICD-10-CM

## 2024-07-21 DIAGNOSIS — F32A Depression, unspecified: Secondary | ICD-10-CM | POA: Diagnosis present

## 2024-07-21 DIAGNOSIS — Z87891 Personal history of nicotine dependence: Secondary | ICD-10-CM

## 2024-07-21 DIAGNOSIS — Z9221 Personal history of antineoplastic chemotherapy: Secondary | ICD-10-CM

## 2024-07-21 DIAGNOSIS — J690 Pneumonitis due to inhalation of food and vomit: Secondary | ICD-10-CM | POA: Diagnosis present

## 2024-07-21 DIAGNOSIS — I252 Old myocardial infarction: Secondary | ICD-10-CM

## 2024-07-21 DIAGNOSIS — R6520 Severe sepsis without septic shock: Secondary | ICD-10-CM | POA: Diagnosis present

## 2024-07-21 DIAGNOSIS — I5033 Acute on chronic diastolic (congestive) heart failure: Secondary | ICD-10-CM | POA: Diagnosis present

## 2024-07-21 DIAGNOSIS — I495 Sick sinus syndrome: Secondary | ICD-10-CM

## 2024-07-21 DIAGNOSIS — J9601 Acute respiratory failure with hypoxia: Principal | ICD-10-CM | POA: Diagnosis present

## 2024-07-21 DIAGNOSIS — E1122 Type 2 diabetes mellitus with diabetic chronic kidney disease: Secondary | ICD-10-CM | POA: Diagnosis present

## 2024-07-21 DIAGNOSIS — Z91040 Latex allergy status: Secondary | ICD-10-CM

## 2024-07-21 DIAGNOSIS — E785 Hyperlipidemia, unspecified: Secondary | ICD-10-CM | POA: Diagnosis present

## 2024-07-21 DIAGNOSIS — I130 Hypertensive heart and chronic kidney disease with heart failure and stage 1 through stage 4 chronic kidney disease, or unspecified chronic kidney disease: Secondary | ICD-10-CM | POA: Diagnosis present

## 2024-07-21 DIAGNOSIS — C771 Secondary and unspecified malignant neoplasm of intrathoracic lymph nodes: Secondary | ICD-10-CM | POA: Diagnosis present

## 2024-07-21 DIAGNOSIS — E872 Acidosis, unspecified: Secondary | ICD-10-CM | POA: Diagnosis present

## 2024-07-21 DIAGNOSIS — Z833 Family history of diabetes mellitus: Secondary | ICD-10-CM

## 2024-07-21 DIAGNOSIS — Z8249 Family history of ischemic heart disease and other diseases of the circulatory system: Secondary | ICD-10-CM

## 2024-07-21 DIAGNOSIS — I10 Essential (primary) hypertension: Secondary | ICD-10-CM

## 2024-07-21 DIAGNOSIS — J189 Pneumonia, unspecified organism: Secondary | ICD-10-CM | POA: Diagnosis present

## 2024-07-21 DIAGNOSIS — Z87442 Personal history of urinary calculi: Secondary | ICD-10-CM

## 2024-07-21 DIAGNOSIS — F419 Anxiety disorder, unspecified: Secondary | ICD-10-CM | POA: Diagnosis present

## 2024-07-21 DIAGNOSIS — Z888 Allergy status to other drugs, medicaments and biological substances status: Secondary | ICD-10-CM

## 2024-07-21 DIAGNOSIS — I69322 Dysarthria following cerebral infarction: Secondary | ICD-10-CM

## 2024-07-21 DIAGNOSIS — J440 Chronic obstructive pulmonary disease with acute lower respiratory infection: Secondary | ICD-10-CM | POA: Diagnosis present

## 2024-07-21 DIAGNOSIS — Z823 Family history of stroke: Secondary | ICD-10-CM

## 2024-07-21 DIAGNOSIS — Z86711 Personal history of pulmonary embolism: Secondary | ICD-10-CM

## 2024-07-21 LAB — I-STAT VENOUS BLOOD GAS, ED
Acid-base deficit: 7 mmol/L — ABNORMAL HIGH (ref 0.0–2.0)
Bicarbonate: 19.4 mmol/L — ABNORMAL LOW (ref 20.0–28.0)
Calcium, Ion: 1.13 mmol/L — ABNORMAL LOW (ref 1.15–1.40)
HCT: 28 % — ABNORMAL LOW (ref 36.0–46.0)
Hemoglobin: 9.5 g/dL — ABNORMAL LOW (ref 12.0–15.0)
O2 Saturation: 85 %
Potassium: 3.5 mmol/L (ref 3.5–5.1)
Sodium: 144 mmol/L (ref 135–145)
TCO2: 21 mmol/L — ABNORMAL LOW (ref 22–32)
pCO2, Ven: 40.1 mmHg — ABNORMAL LOW (ref 44–60)
pH, Ven: 7.293 (ref 7.25–7.43)
pO2, Ven: 56 mmHg — ABNORMAL HIGH (ref 32–45)

## 2024-07-21 LAB — I-STAT CHEM 8, ED
BUN: 21 mg/dL (ref 8–23)
Calcium, Ion: 1.12 mmol/L — ABNORMAL LOW (ref 1.15–1.40)
Chloride: 110 mmol/L (ref 98–111)
Creatinine, Ser: 1.8 mg/dL — ABNORMAL HIGH (ref 0.44–1.00)
Glucose, Bld: 215 mg/dL — ABNORMAL HIGH (ref 70–99)
HCT: 28 % — ABNORMAL LOW (ref 36.0–46.0)
Hemoglobin: 9.5 g/dL — ABNORMAL LOW (ref 12.0–15.0)
Potassium: 3.5 mmol/L (ref 3.5–5.1)
Sodium: 144 mmol/L (ref 135–145)
TCO2: 19 mmol/L — ABNORMAL LOW (ref 22–32)

## 2024-07-21 LAB — COMPREHENSIVE METABOLIC PANEL WITH GFR
ALT: 16 U/L (ref 0–44)
AST: 41 U/L (ref 15–41)
Albumin: 3.7 g/dL (ref 3.5–5.0)
Alkaline Phosphatase: 83 U/L (ref 38–126)
Anion gap: 16 — ABNORMAL HIGH (ref 5–15)
BUN: 21 mg/dL (ref 8–23)
CO2: 18 mmol/L — ABNORMAL LOW (ref 22–32)
Calcium: 9 mg/dL (ref 8.9–10.3)
Chloride: 107 mmol/L (ref 98–111)
Creatinine, Ser: 1.86 mg/dL — ABNORMAL HIGH (ref 0.44–1.00)
GFR, Estimated: 30 mL/min — ABNORMAL LOW
Glucose, Bld: 249 mg/dL — ABNORMAL HIGH (ref 70–99)
Potassium: 4.2 mmol/L (ref 3.5–5.1)
Sodium: 141 mmol/L (ref 135–145)
Total Bilirubin: 0.3 mg/dL (ref 0.0–1.2)
Total Protein: 7 g/dL (ref 6.5–8.1)

## 2024-07-21 LAB — URINALYSIS, W/ REFLEX TO CULTURE (INFECTION SUSPECTED)
Bilirubin Urine: NEGATIVE
Glucose, UA: 50 mg/dL — AB
Ketones, ur: NEGATIVE mg/dL
Leukocytes,Ua: NEGATIVE
Nitrite: NEGATIVE
Protein, ur: >=300 mg/dL — AB
RBC / HPF: >50 RBC/hpf (ref 0–5)
Specific Gravity, Urine: 1.016 (ref 1.005–1.030)
pH: 5 (ref 5.0–8.0)

## 2024-07-21 LAB — RESP PANEL BY RT-PCR (RSV, FLU A&B, COVID)  RVPGX2
Influenza A by PCR: NEGATIVE
Influenza B by PCR: NEGATIVE
Resp Syncytial Virus by PCR: NEGATIVE
SARS Coronavirus 2 by RT PCR: NEGATIVE

## 2024-07-21 LAB — CBC WITH DIFFERENTIAL/PLATELET
Abs Immature Granulocytes: 0.04 K/uL (ref 0.00–0.07)
Basophils Absolute: 0 K/uL (ref 0.0–0.1)
Basophils Relative: 1 %
Eosinophils Absolute: 0.1 K/uL (ref 0.0–0.5)
Eosinophils Relative: 2 %
HCT: 35.1 % — ABNORMAL LOW (ref 36.0–46.0)
Hemoglobin: 10.7 g/dL — ABNORMAL LOW (ref 12.0–15.0)
Immature Granulocytes: 1 %
Lymphocytes Relative: 12 %
Lymphs Abs: 0.9 K/uL (ref 0.7–4.0)
MCH: 28.5 pg (ref 26.0–34.0)
MCHC: 30.5 g/dL (ref 30.0–36.0)
MCV: 93.6 fL (ref 80.0–100.0)
Monocytes Absolute: 0.7 K/uL (ref 0.1–1.0)
Monocytes Relative: 9 %
Neutro Abs: 6 K/uL (ref 1.7–7.7)
Neutrophils Relative %: 75 %
Platelets: 244 K/uL (ref 150–400)
RBC: 3.75 MIL/uL — ABNORMAL LOW (ref 3.87–5.11)
RDW: 16.4 % — ABNORMAL HIGH (ref 11.5–15.5)
WBC: 7.8 K/uL (ref 4.0–10.5)
nRBC: 0 % (ref 0.0–0.2)

## 2024-07-21 LAB — I-STAT CG4 LACTIC ACID, ED
Lactic Acid, Venous: 4 mmol/L (ref 0.5–1.9)
Lactic Acid, Venous: 4.4 mmol/L (ref 0.5–1.9)

## 2024-07-21 LAB — CBG MONITORING, ED
Glucose-Capillary: 199 mg/dL — ABNORMAL HIGH (ref 70–99)
Glucose-Capillary: 249 mg/dL — ABNORMAL HIGH (ref 70–99)
Glucose-Capillary: 238 mg/dL — ABNORMAL HIGH (ref 70–99)

## 2024-07-21 LAB — MAGNESIUM: Magnesium: 2.5 mg/dL — ABNORMAL HIGH (ref 1.7–2.4)

## 2024-07-21 LAB — HEMOGLOBIN A1C
Hgb A1c MFr Bld: 6.8 % — ABNORMAL HIGH (ref 4.8–5.6)
Mean Plasma Glucose: 148.46 mg/dL

## 2024-07-21 LAB — TROPONIN T, HIGH SENSITIVITY
Troponin T High Sensitivity: 47 ng/L — ABNORMAL HIGH (ref 0–19)
Troponin T High Sensitivity: 94 ng/L — ABNORMAL HIGH (ref 0–19)

## 2024-07-21 LAB — PRO BRAIN NATRIURETIC PEPTIDE: Pro Brain Natriuretic Peptide: 3463 pg/mL — ABNORMAL HIGH

## 2024-07-21 LAB — PROTIME-INR
INR: 1.6 — ABNORMAL HIGH (ref 0.8–1.2)
Prothrombin Time: 19.7 s — ABNORMAL HIGH (ref 11.4–15.2)

## 2024-07-21 MED ORDER — ALBUTEROL SULFATE (2.5 MG/3ML) 0.083% IN NEBU: 10.0000 mg/h | INHALATION_SOLUTION | Freq: Once | RESPIRATORY_TRACT | Status: AC

## 2024-07-21 MED ORDER — MAGNESIUM SULFATE 2 GM/50ML IV SOLN
2.0000 g | Freq: Once | INTRAVENOUS | Status: AC
  Administered 2024-07-21: 2 g via INTRAVENOUS

## 2024-07-21 MED ORDER — ALBUTEROL SULFATE (2.5 MG/3ML) 0.083% IN NEBU
10.0000 mg | INHALATION_SOLUTION | Freq: Once | RESPIRATORY_TRACT | Status: AC
  Administered 2024-07-21: 10 mg via RESPIRATORY_TRACT
  Filled 2024-07-21: qty 12

## 2024-07-21 MED ORDER — INSULIN GLARGINE 100 UNIT/ML ~~LOC~~ SOLN
15.0000 [IU] | Freq: Every day | SUBCUTANEOUS | Status: DC
  Administered 2024-07-21 – 2024-07-23 (×3): 15 [IU] via SUBCUTANEOUS
  Filled 2024-07-21 (×4): qty 0.15

## 2024-07-21 MED ORDER — ALBUTEROL SULFATE (2.5 MG/3ML) 0.083% IN NEBU
INHALATION_SOLUTION | RESPIRATORY_TRACT | Status: AC
  Administered 2024-07-21: 10 mg via RESPIRATORY_TRACT
  Filled 2024-07-21: qty 12

## 2024-07-21 MED ORDER — ORAL CARE MOUTH RINSE
15.0000 mL | OROMUCOSAL | Status: DC
  Administered 2024-07-22 – 2024-07-24 (×9): 15 mL via OROMUCOSAL

## 2024-07-21 MED ORDER — SODIUM CHLORIDE 0.9 % IV SOLN: INTRAVENOUS | Status: DC

## 2024-07-21 MED ORDER — MELATONIN 5 MG PO TABS
5.0000 mg | ORAL_TABLET | Freq: Every day | ORAL | Status: DC
  Administered 2024-07-22 – 2024-07-23 (×2): 5 mg via ORAL
  Filled 2024-07-21 (×2): qty 1

## 2024-07-21 MED ORDER — SODIUM CHLORIDE 0.9 % IV SOLN: 500.0000 mg | Freq: Once | INTRAVENOUS | Status: DC

## 2024-07-21 MED ORDER — ROSUVASTATIN CALCIUM 20 MG PO TABS
40.0000 mg | ORAL_TABLET | Freq: Every day | ORAL | Status: DC
  Administered 2024-07-22 – 2024-07-24 (×3): 40 mg via ORAL
  Filled 2024-07-21 (×2): qty 2

## 2024-07-21 MED ORDER — SODIUM CHLORIDE 0.9 % IV SOLN
2.0000 g | Freq: Once | INTRAVENOUS | Status: AC
  Administered 2024-07-21: 2 g via INTRAVENOUS

## 2024-07-21 MED ORDER — SODIUM CHLORIDE 0.9 % IV SOLN
1.0000 g | INTRAVENOUS | Status: DC
  Administered 2024-07-21 – 2024-07-22 (×2): 1 g via INTRAVENOUS
  Filled 2024-07-21 (×2): qty 10

## 2024-07-21 MED ORDER — SODIUM CHLORIDE 0.9 % IV SOLN: 1.0000 g | Freq: Once | INTRAVENOUS | Status: DC

## 2024-07-21 MED ORDER — ORAL CARE MOUTH RINSE: 15.0000 mL | OROMUCOSAL | Status: DC | PRN

## 2024-07-21 MED ORDER — SODIUM CHLORIDE 0.9 % IV BOLUS
1000.0000 mL | Freq: Once | INTRAVENOUS | Status: AC
  Administered 2024-07-21: 1000 mL via INTRAVENOUS

## 2024-07-21 MED ORDER — ASPIRIN 81 MG PO TBEC
81.0000 mg | DELAYED_RELEASE_TABLET | Freq: Every day | ORAL | Status: DC
  Administered 2024-07-22 – 2024-07-24 (×3): 81 mg via ORAL
  Filled 2024-07-21 (×2): qty 1

## 2024-07-21 MED ORDER — CITALOPRAM HYDROBROMIDE 20 MG PO TABS
10.0000 mg | ORAL_TABLET | Freq: Every day | ORAL | Status: DC
  Administered 2024-07-22 – 2024-07-24 (×3): 10 mg via ORAL
  Filled 2024-07-21 (×2): qty 1

## 2024-07-21 MED ORDER — VANCOMYCIN HCL 2000 MG/400ML IV SOLN
2000.0000 mg | Freq: Once | INTRAVENOUS | Status: AC
  Administered 2024-07-21: 2000 mg via INTRAVENOUS
  Filled 2024-07-21: qty 400

## 2024-07-21 MED ORDER — BUDESON-GLYCOPYRROL-FORMOTEROL 160-9-4.8 MCG/ACT IN AERO
2.0000 | INHALATION_SPRAY | Freq: Two times a day (BID) | RESPIRATORY_TRACT | Status: DC
  Filled 2024-07-21 (×3): qty 5.9

## 2024-07-21 MED ORDER — ENOXAPARIN SODIUM 100 MG/ML IJ SOSY
85.0000 mg | PREFILLED_SYRINGE | Freq: Two times a day (BID) | INTRAMUSCULAR | Status: DC
  Administered 2024-07-21 – 2024-07-22 (×3): 85 mg via SUBCUTANEOUS
  Filled 2024-07-21 (×5): qty 0.85

## 2024-07-21 MED ORDER — AZITHROMYCIN 250 MG PO TABS: 500.0000 mg | ORAL_TABLET | Freq: Every day | ORAL | Status: DC

## 2024-07-21 MED ORDER — INSULIN ASPART 100 UNIT/ML IJ SOLN
0.0000 [IU] | Freq: Three times a day (TID) | INTRAMUSCULAR | Status: DC
  Administered 2024-07-21 (×2): 5 [IU] via SUBCUTANEOUS
  Administered 2024-07-22: 3 [IU] via SUBCUTANEOUS
  Administered 2024-07-22: 2 [IU] via SUBCUTANEOUS
  Administered 2024-07-23: 3 [IU] via SUBCUTANEOUS
  Administered 2024-07-23: 2 [IU] via SUBCUTANEOUS
  Administered 2024-07-24: 3 [IU] via SUBCUTANEOUS
  Filled 2024-07-21: qty 5
  Filled 2024-07-21: qty 2
  Filled 2024-07-21: qty 3
  Filled 2024-07-21: qty 5
  Filled 2024-07-21: qty 3

## 2024-07-21 MED ORDER — SODIUM CHLORIDE 0.9 % IV SOLN
500.0000 mg | INTRAVENOUS | Status: DC
  Administered 2024-07-21: 500 mg via INTRAVENOUS
  Filled 2024-07-21: qty 5

## 2024-07-21 MED ORDER — ENOXAPARIN SODIUM 40 MG/0.4ML IJ SOSY: 40.0000 mg | PREFILLED_SYRINGE | INTRAMUSCULAR | Status: DC

## 2024-07-21 NOTE — ED Provider Notes (Signed)
 Patient apparently was eating and drinking and choked on some liquids that she was drinking.  Was called to the room because she was having increased respiratory distress with tachypnea and diminished breath sounds bilaterally.  She was placed back on BiPAP.  Nebulizer treatment was started.  Ordered a repeat chest x-ray.  Will notify the admitting team.     Lenor Hollering, MD 07/21/24 [phone]

## 2024-07-21 NOTE — ED Notes (Signed)
 Pt heard coughing from the nurse's station. This RN at bedside, pt state's that her drink went down wrong. Rt called to return to the pt's room, RR in the 50s. ED provider called to bedside.

## 2024-07-21 NOTE — ED Notes (Addendum)
 CCMD called, pt placed on cardiac monitoring.

## 2024-07-21 NOTE — ED Notes (Signed)
 Oral medications held due to pt being on bi-pap.

## 2024-07-21 NOTE — ED Notes (Signed)
 Pt is sitting in the bed eating. Has maintained 99-100% O2 level on high flow [place]. Pt's RR increasing to the 30s, pt advised to slow down eating and to take deep breaths in her nose and out her mouth to slow down her respiratory rate.

## 2024-07-21 NOTE — Plan of Care (Signed)

## 2024-07-21 NOTE — H&P (Addendum)
 " History and Physical    Patient: Kelly Gibson FMW:[id number] DOB: 1961/01/10 DOA: 07/21/2024 DOS: the patient was seen and examined on 07/21/2024 PCP: Oris [name], NP  Patient coming from: Home  Chief Complaint:  Chief Complaint  Patient presents with   Respiratory Distress   HPI: Kelly Gibson is a 63 y.o. female with medical history significant of stage III NSCLC s/p chemotherapy (paclitaxel ) on active immunotherapy (Imfinzi ), PE on Eliquis , CAD (3 vessel disease but CABG deferred per pt), AV block, COPD, HTN, HLD, DM2, CVA with residual hemiparesis and dysphagia, and CKD3 who p/w SOB and found to have sepsis 2/2 LLL CAP.  The patient is a limited historian. From what I can gather, pt reports experiencing a syncopal episode while in bed, during which the patient lost consciousness for approximately forty-five minutes. The patient did not fall or hit their head. Upon regaining consciousness, the patient found themselves in the hospital. Prior to this event, the patient did not report feeling sick but did feel tired.   In the ED, pt tachycypneic w/ hypoxia on BiPAP. Labs notable for Cr 1.8 (which is baseline), BNP 3463, and lactic acid 4.0. CXR showed mild left basilar opacity is noted concerning for possible pneumonia. EDP requested admission for PCU given sepsis iso CAP.   Review of Systems: As mentioned in the history of present illness. All other systems reviewed and are negative. Past Medical History:  Diagnosis Date   Acute pulmonary embolism without acute cor pulmonale (HCC) 01/13/2024   Bradycardia    SR/SB with up to 3.5 second pause (asymptomatic) on ~ 10/2017 event monitor; saw EP Dr. Kelsie: avoid AV nodal blocking agents   Chronic kidney disease    COPD (chronic obstructive pulmonary disease) (HCC)    Depression    Depression with anxiety 08/29/2021   Diabetes mellitus    Dysarthria, post-stroke    Dysrhythmia    Ganglion cyst 03/09/2012   Heart block AV  complete (HCC) 12/11/2023   History of kidney stones    Hyperlipidemia    Hypertension    Hypokalemia    Myocardial infarction [facility])    NSTEMI   Non-ST elevation (NSTEMI) myocardial infarction (HCC) 12/11/2023   Obesity, unspecified 12/17/2011   Pneumonia    Pneumonia of left lower lobe due to infectious organism 12/12/2023   PONV (postoperative nausea and vomiting)    Skin lesion of face 11/17/2022   Stroke (HCC)    2019   right side hemiparesis   Past Surgical History:  Procedure Laterality Date   CESAREAN SECTION     x 3   IR IMAGING GUIDED PORT INSERTION  03/09/2024   LEFT HEART CATH AND CORONARY ANGIOGRAPHY N/A 12/28/2023   Procedure: LEFT HEART CATH AND CORONARY ANGIOGRAPHY;  Surgeon: Elmira Newman PARAS, MD;  Location: [facility] INVASIVE CV LAB;  Service: Cardiovascular;  Laterality: N/A;   ORIF WRIST FRACTURE Right 05/04/2018   Procedure: OPEN REDUCTION INTERNAL FIXATION (ORIF) RIGHT  WRIST FRACTURE;  Surgeon: Cristy Bonner DASEN, MD;  Location: [facility] OR;  Service: Orthopedics;  Laterality: Right;   TUMOR REMOVAL     left shoulder   VIDEO BRONCHOSCOPY WITH ENDOBRONCHIAL NAVIGATION  01/03/2024   Procedure: VIDEO BRONCHOSCOPY WITH ENDOBRONCHIAL NAVIGATION;  Surgeon: Shelah Lamar RAMAN, MD;  Location: [facility] ENDOSCOPY;  Service: Pulmonary;;   VIDEO BRONCHOSCOPY WITH ENDOBRONCHIAL ULTRASOUND Bilateral 01/03/2024   Procedure: BRONCHOSCOPY, WITH EBUS;  Surgeon: Shelah Lamar RAMAN, MD;  Location: [facility] ENDOSCOPY;  Service: Pulmonary;  Laterality: Bilateral;  Social History:  reports that she has quit smoking. Her smoking use included cigarettes. She has a 20 pack-year smoking history. She has never used smokeless tobacco. She reports that she does not drink alcohol and does not use drugs.  Allergies[1]  Family History  Problem Relation Age of Onset   Diabetes Mother    Stroke Mother    Heart disease Father    Cancer Father    Stroke Father     Prior to Admission medications  Medication Sig Start Date End  Date Taking? Authorizing Provider  albuterol  (PROVENTIL ) (2.5 MG/3ML) 0.083% nebulizer solution Inhale 3 mLs (2.5 mg total) into the lungs every 6 (six) hours as needed for wheezing or shortness of breath. 01/04/24  Yes Darci Pore, MD  albuterol  (VENTOLIN  HFA) 108 (90 Base) MCG/ACT inhaler Inhale 2 puffs into the lungs every 6 (six) hours as needed for wheezing or shortness of breath. 12/14/23  Yes Perri DELENA Meliton Mickey., MD  amLODipine  (NORVASC ) 5 MG tablet Take 1 tablet (5 mg total) by mouth daily. 05/15/24  Yes Early, [name], NP  aspirin  EC 81 MG tablet Take 1 tablet (81 mg total) by mouth daily. Swallow whole. 12/15/23  Yes Perri DELENA Meliton Mickey., MD  budesonide -glycopyrrolate -formoterol  (BREZTRI  AEROSPHERE) 160-9-4.8 MCG/ACT AERO inhaler Inhale 2 puffs into the lungs 2 (two) times daily. 12/14/23  Yes Perri DELENA Meliton Mickey., MD  citalopram  (CELEXA ) 10 MG tablet TAKE 1 TABLET BY MOUTH DAILY 07/06/24  Yes Early, Sara E, NP  Doxylamine  Succinate, Sleep, (UNISOM  PO) Take 2 each by mouth at bedtime. Gel capsules   Yes [provider]  ELIQUIS  5 MG TABS tablet TAKE 1 TABLET BY MOUTH 2 TIMES A DAY 06/20/24  Yes Sherrod Sherrod, MD  furosemide  (LASIX ) 20 MG tablet Take 1 tablet (20 mg total) by mouth daily as needed for fluid or edema (take if daily weight is greater than 2 lbs from the previous day's weight.). 01/14/24 07/21/24 Yes Laurence Locus, DO  hydrALAZINE  (APRESOLINE ) 100 MG tablet 1 tablet up to every 12 hours only as needed for blood pressure >150/90 01/10/24  Yes Early, Sara E, NP  insulin  aspart (NOVOLOG  FLEXPEN) 100 UNIT/ML FlexPen Inject 2-10 Units into the skin 3 (three) times daily with meals.   Yes [provider]  insulin  degludec (TRESIBA  FLEXTOUCH) 100 UNIT/ML FlexTouch Pen 22u AM and 10u PM. Patient taking differently: 8-22 Units See admin instructions. Inject 22 units in the morning and inject 8-10 units every night as directed. 05/15/24  Yes Early, Sara E, NP   lidocaine -prilocaine  (EMLA ) cream Apply to the Port-A-Cath site 30-60 minutes before treatment 03/21/24  Yes Sherrod Sherrod, MD  Melatonin 5 MG CHEW Chew 10 mg by mouth at bedtime.   Yes [provider]  Multiple Vitamins-Minerals (MULTIVITAMIN GUMMIES WOMENS) CHEW Chew 2 each by mouth in the morning.   Yes [provider]  olmesartan  (BENICAR ) 40 MG tablet Take 1 tablet (40 mg total) by mouth in the morning. 05/15/24  Yes Early, Sara E, NP  prochlorperazine  (COMPAZINE ) 10 MG tablet Take 1 tablet (10 mg total) by mouth every 6 (six) hours as needed. 02/15/24  Yes Heilingoetter, Cassandra L, PA-C  rosuvastatin  (CRESTOR ) 40 MG tablet TAKE 1 TABLET BY MOUTH DAILY 05/15/24  Yes Early, Sara E, NP  tiZANidine  (ZANAFLEX ) 2 MG tablet Take 2 mg by mouth at bedtime. 04/30/24  Yes [provider]  amoxicillin -clavulanate (AUGMENTIN ) 875-125 MG tablet Take 1 tablet by mouth 2 (two) times daily. Patient  not taking: Reported on 07/21/2024 07/19/24   Heilingoetter, Cassandra L, PA-C  cefadroxil  (DURICEF) 500 MG capsule Take 1 capsule (500 mg total) by mouth daily. Patient not taking: No sig reported 02/01/24   Early, [name], NP  Continuous Glucose Sensor (DEXCOM G7 SENSOR) MISC Take 1 Device by mouth See admin instructions. Change sensor every 10 days    [provider]  UNABLE TO FIND Take 1 tablet by mouth with breakfast, with lunch, and with evening meal. GlucoGold -Berberine, Concentrated Cinnamon, Chromium, Banaba Leaf Extract Patient not taking: Reported on 07/21/2024    [provider]    Physical Exam: Vitals:   07/21/24 0930 07/21/24 0945 07/21/24 0954 07/21/24 0957  BP: (!) 107/46 (!) 105/49 (!) 110/47   Pulse:   (!) 55   Resp: (!) 21 (!) 22 (!) 25   Temp:    (!) 96.8 F (36 C)  TempSrc:    Axillary  SpO2:   100%    General: Alert, oriented x3, resting comfortably in no acute distress HEENT: EOMI, oropharynx clear, moist mucous membranes, hearing  intact Neck: Trachea midline and no gross thyromegaly Respiratory: Lungs clear to auscultation bilaterally with normal respiratory effort; no w/r/r Cardiovascular: Regular rate and rhythm w/o m/r/g Abdomen: Soft, nontender, nondistended. Positive bowel sounds MSK: No obvious joint deformities or swelling Skin: No obvious rashes or lesions Neurologic: Awake, alert, spontaneously moves all extremities, strength intact Psychiatric: Appropriate mood and affect, conversational and cooperative   Data Reviewed:  Lab Results  Component Value Date   WBC 7.8 07/21/2024   HGB 9.5 (L) 07/21/2024   HCT 28.0 (L) 07/21/2024   MCV 93.6 07/21/2024   PLT 244 07/21/2024   Lab Results  Component Value Date   GLUCOSE 215 (H) 07/21/2024   CALCIUM  9.0 07/21/2024   NA 144 07/21/2024   K 3.5 07/21/2024   CO2 18 (L) 07/21/2024   CL 110 07/21/2024   BUN 21 07/21/2024   CREATININE 1.80 (H) 07/21/2024   Lab Results  Component Value Date   ALT 16 07/21/2024   AST 41 07/21/2024   ALKPHOS 83 07/21/2024   BILITOT 0.3 07/21/2024   Lab Results  Component Value Date   INR 1.6 (H) 07/21/2024   INR 1.0 12/13/2023   INR 1.04 12/05/2017   Radiology: DG Chest Port 1 View Result Date: 07/21/2024 CLINICAL DATA:  Sepsis EXAM: PORTABLE CHEST 1 VIEW COMPARISON:  January 12, 2024 FINDINGS: Mild cardiomegaly is noted. Right internal jugular Port-A-Cath is noted with distal tip in expected position of cavoatrial junction. Mild left basilar opacity is noted concerning for possible pneumonia or atelectasis with minimal associated pleural effusion. IMPRESSION: Mild left basilar opacity is noted concerning for possible pneumonia or atelectasis with minimal associated pleural effusion. Electronically Signed   By: Lynwood Landy Raddle M.D.   On: 07/21/2024 08:47    Assessment and Plan: 63F h/o stage III NSCLC s/p chemotherapy (paclitaxel ) on active immunotherapy (Imfinzi ), PE on Eliquis , CAD (3 vessel disease but CABG deferred  per pt), AV block, COPD, HTN, HLD, DM2, CVA with residual hemiparesis and dysphagia, and CKD3 c/b L renal staghorn calculi s/p L nephrostomy tube who p/w SOB and found to have sepsis 2/2 LLL CAP.  Sepsis 2/2 LLL CAP As evidenced by lactic acid 4 -MIVF: NS at 100cc/h for now -IV CTX 1g daily to complete 5 day CAP course -IV azithromycin  500mg  daily to complete 3 day CAP course -Duonebs prn -Wean O2 as tolerated -Ambulatory pulse ox prior  to d/c  Aspiration H/o dysphagia Pt thought to have aspirated around 1800; thus, CXR completed and pt placed back on BiPAP with improvement; will continue BiPAP and NPO overnight -SLP consulted; apprec eval/recs -NPO for now while on BiPAP -F/u CXR; consider chest CT if ongoing O2 requirement  PE -HOLD pta apixaban  while on BiPAP and replace with lovenox  BID per pharmacy  Elevated BNP -Consider IV diuresis once sepsis resolves  HTN -HOLD pta olmesartan , amlodipine , and hydralazine  for now  DM2 -Tresiba  15U nightly (pta 20/10) + SSI TID [facility]   Advance Care Planning:   Code Status: Full Code   Consults: CCM  Family Communication: Spouse  Severity of Illness: The appropriate patient status for this patient is INPATIENT. Inpatient status is judged to be reasonable and necessary in order to provide the required intensity of service to ensure the patient's safety. The patient's presenting symptoms, physical exam findings, and initial radiographic and laboratory data in the context of their chronic comorbidities is felt to place them at high risk for further clinical deterioration. Furthermore, it is not anticipated that the patient will be medically stable for discharge from the hospital within 2 midnights of admission.   * I certify that at the point of admission it is my clinical judgment that the patient will require inpatient hospital care spanning beyond 2 midnights from the point of admission due to high intensity of service, high risk for  further deterioration and high frequency of surveillance required.*   ------- I spent 56 minutes reviewing previous notes, at the bedside counseling/discussing the treatment plan, and performing clinical documentation.  Author: Marsha Ada, MD 07/21/2024 10:35 AM  For on call review [email or web address].      [1]  Allergies Allergen Reactions   Latex Itching and Rash   Niacin Nausea And Vomiting   "

## 2024-07-21 NOTE — ED Provider Notes (Signed)
 " [place] [facility] DEPARTMENT AT [facility] Provider Note   CSN: [id number] Arrival date & time: 07/21/24  9250     Patient presents with: Respiratory Distress   Kelly Gibson is a 63 y.o. female.  She has a tree of PE on Eliquis , COPD, lung cancer on active chemotherapy.  Complaining of shortness of breath for a few days.  EMS found her in respiratory distress minimally responsive and began bagging her.  She does endorse cough.  She denies chest pain.  She was given breathing treatments and steroids.  Sats were 100% being bagged.  No other history available at this time due to respiratory distress.   HPI     Prior to Admission medications  Medication Sig Start Date End Date Taking? Authorizing Provider  albuterol  (PROVENTIL ) (2.5 MG/3ML) 0.083% nebulizer solution Inhale 3 mLs (2.5 mg total) into the lungs every 6 (six) hours as needed for wheezing or shortness of breath. 01/04/24   Darci Pore, MD  albuterol  (VENTOLIN  HFA) 108 (90 Base) MCG/ACT inhaler Inhale 2 puffs into the lungs every 6 (six) hours as needed for wheezing or shortness of breath. 12/14/23   Perri DELENA Meliton Mickey., MD  amLODipine  (NORVASC ) 5 MG tablet Take 1 tablet (5 mg total) by mouth daily. 05/15/24   Early, Sara E, NP  amoxicillin -clavulanate (AUGMENTIN ) 875-125 MG tablet Take 1 tablet by mouth 2 (two) times daily. 07/19/24   Heilingoetter, Cassandra L, PA-C  aspirin  EC 81 MG tablet Take 1 tablet (81 mg total) by mouth daily. Swallow whole. 12/15/23   Perri DELENA Meliton Mickey., MD  budesonide -glycopyrrolate -formoterol  (BREZTRI  AEROSPHERE) 160-9-4.8 MCG/ACT AERO inhaler Inhale 2 puffs into the lungs 2 (two) times daily. 12/14/23   Perri DELENA Meliton Mickey., MD  cefadroxil  (DURICEF) 500 MG capsule Take 1 capsule (500 mg total) by mouth daily. Patient not taking: Reported on 05/23/2024 02/01/24   Early, Sara E, NP  citalopram  (CELEXA ) 10 MG tablet TAKE 1 TABLET BY MOUTH DAILY 07/06/24   Early, Sara E, NP   Continuous Glucose Sensor (DEXCOM G7 SENSOR) MISC Take 1 Device by mouth See admin instructions. Change sensor every 10 days    [provider]  Doxylamine  Succinate, Sleep, (UNISOM  PO) Take 2 each by mouth at bedtime. Gel capsules    [provider]  ELIQUIS  5 MG TABS tablet TAKE 1 TABLET BY MOUTH 2 TIMES A DAY 06/20/24   Sherrod Sherrod, MD  furosemide  (LASIX ) 20 MG tablet Take 1 tablet (20 mg total) by mouth daily as needed for fluid or edema (take if daily weight is greater than 2 lbs from the previous day's weight.). 01/14/24 05/23/24  Laurence Locus, DO  hydrALAZINE  (APRESOLINE ) 100 MG tablet 1 tablet up to every 12 hours only as needed for blood pressure >150/90 01/10/24   Early, Sara E, NP  insulin  aspart (NOVOLOG  FLEXPEN) 100 UNIT/ML FlexPen Inject 3-6 Units into the skin 3 (three) times daily with meals. Getting via NOVO PAP    [provider]  insulin  degludec (TRESIBA  FLEXTOUCH) 100 UNIT/ML FlexTouch Pen 22u AM and 10u PM. 05/15/24   Early, Sara E, NP  lidocaine -prilocaine  (EMLA ) cream Apply to the Port-A-Cath site 30-60 minutes before treatment 03/21/24   Sherrod Sherrod, MD  Melatonin 5 MG CHEW Chew 10 mg by mouth at bedtime.    [provider]  Multiple Vitamins-Minerals (MULTIVITAMIN GUMMIES WOMENS) CHEW Chew 2 each by mouth in the morning.    [provider]  olmesartan  (BENICAR ) 40 MG  tablet Take 1 tablet (40 mg total) by mouth in the morning. 05/15/24   Early, Sara E, NP  prochlorperazine  (COMPAZINE ) 10 MG tablet Take 1 tablet (10 mg total) by mouth every 6 (six) hours as needed. 02/15/24   Heilingoetter, Cassandra L, PA-C  rosuvastatin  (CRESTOR ) 40 MG tablet TAKE 1 TABLET BY MOUTH DAILY 05/15/24   Early, Sara E, NP  UNABLE TO FIND Take 1 tablet by mouth with breakfast, with lunch, and with evening meal. GlucoGold -Berberine, Concentrated Cinnamon, Chromium, Banaba Leaf Extract    [provider]    Allergies: Latex and Niacin     Review of Systems  Unable to perform ROS: Mental status change    Updated Vital Signs LMP 12/10/2012   Physical Exam Vitals and nursing note reviewed.  Constitutional:      General: She is not in acute distress.    Appearance: She is well-developed.  HENT:     Head: Normocephalic and atraumatic.  Eyes:     Conjunctiva/sclera: Conjunctivae normal.  Cardiovascular:     Rate and Rhythm: Normal rate and regular rhythm.     Heart sounds: No murmur heard. Pulmonary:     Effort: Respiratory distress present.     Breath sounds: Wheezing present.  Abdominal:     Palpations: Abdomen is soft.     Tenderness: There is no abdominal tenderness.  Musculoskeletal:        General: No deformity.     Cervical back: Neck supple.     Right lower leg: Edema present.     Left lower leg: Edema present.  Skin:    General: Skin is warm and dry.     Capillary Refill: Capillary refill takes less than 2 seconds.  Neurological:     Motor: Weakness present.     Comments: Right sided weakness chronic from prior stroke     (all labs ordered are listed, but only abnormal results are displayed) Labs Reviewed  COMPREHENSIVE METABOLIC PANEL WITH GFR - Abnormal; Notable for the following components:      Result Value   CO2 18 (*)    Glucose, Bld 249 (*)    Creatinine, Ser 1.86 (*)    GFR, Estimated 30 (*)    Anion gap 16 (*)    All other components within normal limits  CBC WITH DIFFERENTIAL/PLATELET - Abnormal; Notable for the following components:   RBC 3.75 (*)    Hemoglobin 10.7 (*)    HCT 35.1 (*)    RDW 16.4 (*)    All other components within normal limits  PROTIME-INR - Abnormal; Notable for the following components:   Prothrombin Time 19.7 (*)    INR 1.6 (*)    All other components within normal limits  PRO BRAIN NATRIURETIC PEPTIDE - Abnormal; Notable for the following components:   Pro Brain Natriuretic Peptide 3,463.0 (*)    All other components within normal limits   URINALYSIS, W/ REFLEX TO CULTURE (INFECTION SUSPECTED) - Abnormal; Notable for the following components:   APPearance CLOUDY (*)    Glucose, UA 50 (*)    Hgb urine dipstick LARGE (*)    Protein, ur >=300 (*)    Bacteria, UA RARE (*)    All other components within normal limits  MAGNESIUM  - Abnormal; Notable for the following components:   Magnesium  2.5 (*)    All other components within normal limits  HEMOGLOBIN A1C - Abnormal; Notable for the following components:   Hgb A1c MFr Bld 6.8 (*)  All other components within normal limits  I-STAT CG4 LACTIC ACID, ED - Abnormal; Notable for the following components:   Lactic Acid, Venous 4.0 (*)    All other components within normal limits  I-STAT CHEM 8, ED - Abnormal; Notable for the following components:   Creatinine, Ser 1.80 (*)    Glucose, Bld 215 (*)    Calcium , Ion 1.12 (*)    TCO2 19 (*)    Hemoglobin 9.5 (*)    HCT 28.0 (*)    All other components within normal limits  I-STAT VENOUS BLOOD GAS, ED - Abnormal; Notable for the following components:   pCO2, Ven 40.1 (*)    pO2, Ven 56 (*)    Bicarbonate 19.4 (*)    TCO2 21 (*)    Acid-base deficit 7.0 (*)    Calcium , Ion 1.13 (*)    HCT 28.0 (*)    Hemoglobin 9.5 (*)    All other components within normal limits  CBG MONITORING, ED - Abnormal; Notable for the following components:   Glucose-Capillary 199 (*)    All other components within normal limits  I-STAT CG4 LACTIC ACID, ED - Abnormal; Notable for the following components:   Lactic Acid, Venous 4.4 (*)    All other components within normal limits  CBG MONITORING, ED - Abnormal; Notable for the following components:   Glucose-Capillary 249 (*)    All other components within normal limits  CBG MONITORING, ED - Abnormal; Notable for the following components:   Glucose-Capillary 238 (*)    All other components within normal limits  TROPONIN T, HIGH SENSITIVITY - Abnormal; Notable for the following components:    Troponin T High Sensitivity 47 (*)    All other components within normal limits  TROPONIN T, HIGH SENSITIVITY - Abnormal; Notable for the following components:   Troponin T High Sensitivity 94 (*)    All other components within normal limits  RESP PANEL BY RT-PCR (RSV, FLU A&B, COVID)  RVPGX2  CULTURE, BLOOD (ROUTINE X 2)  CULTURE, BLOOD (ROUTINE X 2)    EKG: EKG Interpretation Date/Time:  Friday July 21 2024 08:11:39 EST Ventricular Rate:  96 PR Interval:    QRS Duration:  93 QT Interval:  477 QTC Calculation: 515 R Axis:   -62  Text Interpretation: Atrial fibrillation vs block Left anterior fascicular block Nonspecific T abnrm, anterolateral leads Prolonged QT interval Confirmed by Towana Sharper [phone]) on 07/21/2024 8:17:33 AM  Radiology: [facility] Chest Port 1 View Result Date: 07/21/2024 CLINICAL DATA:  Sepsis EXAM: PORTABLE CHEST 1 VIEW COMPARISON:  January 12, 2024 FINDINGS: Mild cardiomegaly is noted. Right internal jugular Port-A-Cath is noted with distal tip in expected position of cavoatrial junction. Mild left basilar opacity is noted concerning for possible pneumonia or atelectasis with minimal associated pleural effusion. IMPRESSION: Mild left basilar opacity is noted concerning for possible pneumonia or atelectasis with minimal associated pleural effusion. Electronically Signed   By: Lynwood Landy Raddle M.D.   On: 07/21/2024 08:47     .Critical Care  Performed by: Towana Sharper BROCKS, MD Authorized by: Towana Sharper BROCKS, MD   Critical care provider statement:    Critical care time (minutes):  45   Critical care time was exclusive of:  Separately billable procedures and treating other patients   Critical care was necessary to treat or prevent imminent or life-threatening deterioration of the following conditions:  Respiratory failure   Critical care was time spent personally by me on the following activities:  Development of treatment plan with patient or surrogate,  discussions with consultants, evaluation of patient's response to treatment, examination of patient, obtaining history from patient or surrogate, ordering and performing treatments and interventions, ordering and review of laboratory studies, ordering and review of radiographic studies, pulse oximetry, re-evaluation of patient's condition and review of old charts   I assumed direction of critical care for this patient from another provider in my specialty: no      Medications Ordered in the ED  cefTRIAXone  (ROCEPHIN ) 1 g in sodium chloride  0.9 % 100 mL IVPB (has no administration in time range)  0.9 %  sodium chloride  infusion ( Intravenous New Bag/Given 07/21/24 1213)  enoxaparin  (LOVENOX ) injection 85 mg (85 mg Subcutaneous Given 07/21/24 1440)  aspirin  EC tablet 81 mg (0 mg Oral Hold 07/21/24 1438)  budesonide -glycopyrrolate -formoterol  (BREZTRI ) 160-9-4.8 MCG/ACT inhaler 2 puff (0 puffs Inhalation Hold 07/21/24 1222)  citalopram  (CELEXA ) tablet 10 mg (0 mg Oral Hold 07/21/24 1438)  insulin  degludec (TRESIBA ) 100 UNIT/ML FlexTouch Pen 15 Units (has no administration in time range)  melatonin tablet 5 mg (has no administration in time range)  rosuvastatin  (CRESTOR ) tablet 40 mg (0 mg Oral Hold 07/21/24 1438)  azithromycin  (ZITHROMAX ) 500 mg in sodium chloride  0.9 % 250 mL IVPB (0 mg Intravenous Stopped 07/21/24 1536)  insulin  aspart (novoLOG ) injection 0-15 Units (5 Units Subcutaneous Given 07/21/24 1428)  magnesium  sulfate IVPB 2 g 50 mL (0 g Intravenous Stopped 07/21/24 0834)  ceFEPIme  (MAXIPIME ) 2 g in sodium chloride  0.9 % 100 mL IVPB (0 g Intravenous Stopped 07/21/24 0903)  vancomycin  (VANCOREADY) IVPB 2000 mg/400 mL (0 mg Intravenous Stopped 07/21/24 1126)  sodium chloride  0.9 % bolus 1,000 mL (0 mLs Intravenous Stopped 07/21/24 0926)  albuterol  (PROVENTIL ) (2.5 MG/3ML) 0.083% nebulizer solution (10 mg Nebulization Given 07/21/24 0808)    Clinical Course as of 07/21/24 1720  Fri Jul 21, 2024  0804 Last cardiac echo 6/25 EF of 60 to 65%.  Blood pressure low here so we will initiate fluid bolus. [MB]  0804 Broad-spectrum antibiotics ordered with pharmacy assistance.  Giving IV magnesium .  Continuous nebulizer. [MB]  0806 EKG not crossing in epic.  Normal sinus rhythm no acute ST-T changes. [MB]  0813 Chest x-ray with volume loss versus mass right base.  Awaiting radiology reading. [MB]  [phone] Patient's husband is here.  He said she has had on and off shortness of breath for a couple of days.  She was more short of breath this morning and he tried to give her some breathing treatments.  She became less responsive and so he called 911.  He said she saw her cancer doctors on Tuesday and on Wednesday they called saying she had a urinary tract infection.  He has not picked up her antibiotics yet [MB]  1011 Discussed with [facility] hospitalist Dr. Georgina who will evaluate for admission. [MB]    Clinical Course User Index [MB] Towana Ozell BROCKS, MD                                 Medical Decision Making Amount and/or Complexity of Data Reviewed Labs: ordered. Radiology: ordered.  Risk Prescription drug management. Decision regarding hospitalization.   This patient complains of severe shortness of breath, altered mental status; this involves an extensive number of treatment Options and is a complaint that carries with it a high risk of complications and morbidity. The differential includes CHF, COPD,  pneumonia, hypercapnia, sepsis  I ordered, reviewed and interpreted labs, which included CBC with chronically low hemoglobin, chemistries with elevated creatinine, lactate elevated, VBG without acidosis, troponins elevated, urinalysis with some hematuria I ordered medication oxygen magnesium  IV antibiotics IV fluids and reviewed PMP when indicated. I ordered imaging studies which included chest x-ray and I independently    visualized and interpreted imaging which showed possible left  base pneumonia Additional history obtained from EMS and patient's husband Previous records obtained and reviewed in epic including outpatient oncology notes I consulted [facility] hospitalist Dr. Georgina and discussed lab and imaging findings and discussed disposition.  Cardiac monitoring reviewed, sinus rhythm Social determinants considered, social isolation physically inactive Critical Interventions: Initiation of BiPAP, treatment of possible sepsis with goal-directed fluids and antibiotics  After the interventions stated above, I reevaluated the patient and found patient to be more alert and able to come off of BiPAP Admission and further testing considered, she would benefit from mission to the hospital for further management.      Final diagnoses:  Acute respiratory failure with hypoxia (HCC)  Pneumonia of left lower lobe due to infectious organism    ED Discharge Orders     None          Towana Ozell BROCKS, MD 07/21/24 1725  "

## 2024-07-21 NOTE — ED Notes (Signed)
 Pt food was removed from lap to desk pt stated not to throw away her food pt was advised to wait 30 minutes to eat

## 2024-07-21 NOTE — ED Notes (Signed)
 RT attempted to discontinue Bi-pap, pt placed on nasal canula. Pt began de-sating O2 dropped into the 80s. Pt endorsed feeling more difficulty breathing with nasal canula.

## 2024-07-21 NOTE — Progress Notes (Signed)
 Pt transported from ED to floor via BIPAP w/o complications VSS throughout. RT and RN, along with unit staff at bedside.

## 2024-07-21 NOTE — Progress Notes (Signed)
 PHARMACY - ANTICOAGULATION CONSULT NOTE  Pharmacy Consult for therapeutic enoxaparin  Indication: hx pulmonary embolus  Allergies[1]  Patient Measurements:    Vital Signs: Temp: 96.8 F (36 C) (12/26 0957) Temp Source: Axillary (12/26 0957) BP: 110/47 (12/26 0954) Pulse Rate: 55 (12/26 0954)  Labs: Recent Labs    07/21/24 0756 07/21/24 0932 07/21/24 0934  HGB 10.7* 9.5* 9.5*  HCT 35.1* 28.0* 28.0*  PLT 244  --   --   LABPROT 19.7*  --   --   INR 1.6*  --   --   CREATININE 1.86* 1.80*  --     Estimated Creatinine Clearance: 33.3 mL/min (A) (by C-G formula based on SCr of 1.8 mg/dL (H)).   Medical History: Past Medical History:  Diagnosis Date   Acute pulmonary embolism without acute cor pulmonale (HCC) 01/13/2024   Bradycardia    SR/SB with up to 3.5 second pause (asymptomatic) on ~ 10/2017 event monitor; saw EP Dr. Kelsie: avoid AV nodal blocking agents   Chronic kidney disease    COPD (chronic obstructive pulmonary disease) (HCC)    Depression    Depression with anxiety 08/29/2021   Diabetes mellitus    Dysarthria, post-stroke    Dysrhythmia    Ganglion cyst 03/09/2012   Heart block AV complete (HCC) 12/11/2023   History of kidney stones    Hyperlipidemia    Hypertension    Hypokalemia    Myocardial infarction [facility])    NSTEMI   Non-ST elevation (NSTEMI) myocardial infarction (HCC) 12/11/2023   Obesity, unspecified 12/17/2011   Pneumonia    Pneumonia of left lower lobe due to infectious organism 12/12/2023   PONV (postoperative nausea and vomiting)    Skin lesion of face 11/17/2022   Stroke [facility])    2019   right side hemiparesis   Assessment: Kelly Gibson is a 63 y.o. year old female admitted on 07/21/2024 with concern for CAP. On eliquis  prior to admission (LD 12/25 PM). Currently on BiPAP unable to take oral meds. Pharmacy consulted to dose enoxaparin .  Goal of Therapy:  Anti-Xa level 0.6-1 units/ml 4hrs after LMWH dose given Monitor platelets  by anticoagulation protocol: Yes   Plan:  Enoxaparin  85mg  (1mg /kg) q12h (d/t BMI >30) F/u ability to transition back to eliquis   Anti-xa levels as needed  Thank you for allowing pharmacy to participate in this patient's care.  Leonor GORMAN Bash, PharmD Emergency Medicine Clinical Pharmacist 07/21/2024,11:05 AM       [1]  Allergies Allergen Reactions   Latex Itching and Rash   Niacin Nausea And Vomiting

## 2024-07-21 NOTE — ED Notes (Signed)
 Georgina, MD paged.  [phone]

## 2024-07-21 NOTE — ED Triage Notes (Signed)
 Pt bibems from home. Complaints of SHOB. Reports being alert prior to EMS. Hx of COPD and Lung Cancer. Given Duoneb and 125mg  Solumedrol.

## 2024-07-22 ENCOUNTER — Inpatient Hospital Stay (HOSPITAL_COMMUNITY)

## 2024-07-22 DIAGNOSIS — J189 Pneumonia, unspecified organism: Secondary | ICD-10-CM | POA: Diagnosis not present

## 2024-07-22 LAB — CBC WITH DIFFERENTIAL/PLATELET
Abs Immature Granulocytes: 0.03 K/uL (ref 0.00–0.07)
Basophils Absolute: 0 K/uL (ref 0.0–0.1)
Basophils Relative: 0 %
Eosinophils Absolute: 0 K/uL (ref 0.0–0.5)
Eosinophils Relative: 0 %
HCT: 29 % — ABNORMAL LOW (ref 36.0–46.0)
Hemoglobin: 9 g/dL — ABNORMAL LOW (ref 12.0–15.0)
Immature Granulocytes: 0 %
Lymphocytes Relative: 4 %
Lymphs Abs: 0.4 K/uL — ABNORMAL LOW (ref 0.7–4.0)
MCH: 28.3 pg (ref 26.0–34.0)
MCHC: 31 g/dL (ref 30.0–36.0)
MCV: 91.2 fL (ref 80.0–100.0)
Monocytes Absolute: 0.6 K/uL (ref 0.1–1.0)
Monocytes Relative: 6 %
Neutro Abs: 9.1 K/uL — ABNORMAL HIGH (ref 1.7–7.7)
Neutrophils Relative %: 90 %
Platelets: 212 K/uL (ref 150–400)
RBC: 3.18 MIL/uL — ABNORMAL LOW (ref 3.87–5.11)
RDW: 16.5 % — ABNORMAL HIGH (ref 11.5–15.5)
WBC: 10.1 K/uL (ref 4.0–10.5)
nRBC: 0 % (ref 0.0–0.2)

## 2024-07-22 LAB — BASIC METABOLIC PANEL WITH GFR
Anion gap: 10 (ref 5–15)
BUN: 24 mg/dL — ABNORMAL HIGH (ref 8–23)
CO2: 19 mmol/L — ABNORMAL LOW (ref 22–32)
Calcium: 8.7 mg/dL — ABNORMAL LOW (ref 8.9–10.3)
Chloride: 111 mmol/L (ref 98–111)
Creatinine, Ser: 1.56 mg/dL — ABNORMAL HIGH (ref 0.44–1.00)
GFR, Estimated: 37 mL/min — ABNORMAL LOW
Glucose, Bld: 143 mg/dL — ABNORMAL HIGH (ref 70–99)
Potassium: 4.5 mmol/L (ref 3.5–5.1)
Sodium: 141 mmol/L (ref 135–145)

## 2024-07-22 LAB — GLUCOSE, CAPILLARY
Glucose-Capillary: 132 mg/dL — ABNORMAL HIGH (ref 70–99)
Glucose-Capillary: 192 mg/dL — ABNORMAL HIGH (ref 70–99)
Glucose-Capillary: 102 mg/dL — ABNORMAL HIGH (ref 70–99)
Glucose-Capillary: 203 mg/dL — ABNORMAL HIGH (ref 70–99)

## 2024-07-22 LAB — PROCALCITONIN: Procalcitonin: 1.69 ng/mL

## 2024-07-22 LAB — MAGNESIUM: Magnesium: 2.7 mg/dL — ABNORMAL HIGH (ref 1.7–2.4)

## 2024-07-22 LAB — PRO BRAIN NATRIURETIC PEPTIDE: Pro Brain Natriuretic Peptide: 7398 pg/mL — ABNORMAL HIGH

## 2024-07-22 LAB — C-REACTIVE PROTEIN: CRP: 1.5 mg/dL — ABNORMAL HIGH

## 2024-07-22 MED ORDER — FUROSEMIDE 10 MG/ML IJ SOLN
60.0000 mg | Freq: Once | INTRAMUSCULAR | Status: AC
  Administered 2024-07-22: 60 mg via INTRAVENOUS
  Filled 2024-07-22: qty 6

## 2024-07-22 MED ORDER — DOXYCYCLINE HYCLATE 100 MG PO TABS
100.0000 mg | ORAL_TABLET | Freq: Two times a day (BID) | ORAL | Status: DC
  Administered 2024-07-22 – 2024-07-24 (×5): 100 mg via ORAL
  Filled 2024-07-22 (×4): qty 1

## 2024-07-22 MED ORDER — AMLODIPINE BESYLATE 10 MG PO TABS
10.0000 mg | ORAL_TABLET | Freq: Every day | ORAL | Status: DC
  Administered 2024-07-22 – 2024-07-24 (×3): 10 mg via ORAL
  Filled 2024-07-22 (×2): qty 1

## 2024-07-22 MED ORDER — AMOXICILLIN-POT CLAVULANATE 875-125 MG PO TABS
1.0000 | ORAL_TABLET | Freq: Two times a day (BID) | ORAL | Status: DC
  Administered 2024-07-22 – 2024-07-24 (×5): 1 via ORAL
  Filled 2024-07-22 (×4): qty 1

## 2024-07-22 MED ORDER — APIXABAN 5 MG PO TABS
5.0000 mg | ORAL_TABLET | Freq: Two times a day (BID) | ORAL | Status: DC
  Administered 2024-07-22 – 2024-07-24 (×4): 5 mg via ORAL
  Filled 2024-07-22 (×3): qty 1

## 2024-07-22 MED ORDER — HYDRALAZINE HCL 20 MG/ML IJ SOLN
10.0000 mg | Freq: Four times a day (QID) | INTRAMUSCULAR | Status: DC | PRN
  Administered 2024-07-23: 10 mg via INTRAVENOUS
  Filled 2024-07-22: qty 1

## 2024-07-22 MED ORDER — SODIUM CHLORIDE 0.9 % IV SOLN: INTRAVENOUS | Status: DC

## 2024-07-22 MED ORDER — IPRATROPIUM-ALBUTEROL 0.5-2.5 (3) MG/3ML IN SOLN
RESPIRATORY_TRACT | Status: AC
  Administered 2024-07-22: 3 mL
  Filled 2024-07-22: qty 3

## 2024-07-22 NOTE — Evaluation (Addendum)
 Clinical/Bedside Swallow Evaluation Patient Details  Name: Kelly Gibson MRN: [id number] Date of Birth: [birth year]  Today's Date: 07/22/2024 Time: SLP Start Time (ACUTE ONLY): 1100 SLP Stop Time (ACUTE ONLY): 1118 SLP Time Calculation (min) (ACUTE ONLY): 18 min  Past Medical History:  Past Medical History:  Diagnosis Date   Acute pulmonary embolism without acute cor pulmonale (HCC) 01/13/2024   Bradycardia    SR/SB with up to 3.5 second pause (asymptomatic) on ~ 10/2017 event monitor; saw EP Kelly Gibson: avoid AV nodal blocking agents   Chronic kidney disease    COPD (chronic obstructive pulmonary disease) (HCC)    Depression    Depression with anxiety 08/29/2021   Diabetes mellitus    Dysarthria, post-stroke    Dysrhythmia    Ganglion cyst 03/09/2012   Heart block AV complete (HCC) 12/11/2023   History of kidney stones    Hyperlipidemia    Hypertension    Hypokalemia    Myocardial infarction [facility])    NSTEMI   Non-ST elevation (NSTEMI) myocardial infarction (HCC) 12/11/2023   Obesity, unspecified 12/17/2011   Pneumonia    Pneumonia of left lower lobe due to infectious organism 12/12/2023   PONV (postoperative nausea and vomiting)    Skin lesion of face 11/17/2022   Stroke (HCC)    2019   right side hemiparesis   Past Surgical History:  Past Surgical History:  Procedure Laterality Date   CESAREAN SECTION     x 3   IR IMAGING GUIDED PORT INSERTION  03/09/2024   LEFT HEART CATH AND CORONARY ANGIOGRAPHY N/A 12/28/2023   Procedure: LEFT HEART CATH AND CORONARY ANGIOGRAPHY;  Surgeon: Kelly Kelly PARAS, MD;  Location: [facility] INVASIVE CV LAB;  Service: Cardiovascular;  Laterality: N/A;   ORIF WRIST FRACTURE Right 05/04/2018   Procedure: OPEN REDUCTION INTERNAL FIXATION (ORIF) RIGHT  WRIST FRACTURE;  Surgeon: Kelly Kelly DASEN, MD;  Location: [facility] OR;  Service: Orthopedics;  Laterality: Right;   TUMOR REMOVAL     left shoulder   VIDEO BRONCHOSCOPY WITH ENDOBRONCHIAL NAVIGATION   01/03/2024   Procedure: VIDEO BRONCHOSCOPY WITH ENDOBRONCHIAL NAVIGATION;  Surgeon: Shelah Kelly RAMAN, MD;  Location: [facility] ENDOSCOPY;  Service: Pulmonary;;   VIDEO BRONCHOSCOPY WITH ENDOBRONCHIAL ULTRASOUND Bilateral 01/03/2024   Procedure: BRONCHOSCOPY, WITH EBUS;  Surgeon: Shelah Kelly RAMAN, MD;  Location: [facility] ENDOSCOPY;  Service: Pulmonary;  Laterality: Bilateral;   HPI:  63 y.o female presents to [facility] on 12/26 with SOB. Pt with active lung cancer on chemotherapy. Chest x-ray shows L pleural effusion. PMH includes HTN, HLD, DM, CVA with residual R sided hemiparesis and dysphagia, AV block, CKD III, CAD, depression, COPD.  Prior MBS completed 01/04/24 with recommendations for Regular/thin liquids.  ST consulted for clinical swallow evaluation.    Assessment / Plan / Recommendation  Clinical Impression  Recommend initiating a Regular/thin liquid diet with general swallow precautions/respiratory precautions in place d/t dyspnea/new oxygen requirement including 2L via [place]/Bipap since hospitalization.  ST will f/u briefly for diet tolerance/education during acute stay.  Kelly Gibson was seen for a clinical swallow evaluation with respiratory precautions discussed including slow rate, small bites/sips and respiratory breaks prn during all po intake.  Given min verbal cues, pt consumed thin via small sips with cup/straw, puree and solids with slow mastication effort noted, timely swallow and no overt s/s of aspiration observed during trial.  Pt's speech is min dysarthric (baseline per pt/family report) noted by low vocal intensity and deliberate speech pattern during conversation.  Generalized oral weakness observed  during oral motor directives. ST will f/u for diet tolerance/education briefly during acute stay.  Thank you for this consult. SLP Visit Diagnosis: Dysphagia, unspecified (R13.10)    Aspiration Risk  Mild aspiration risk    Diet Recommendation   Thin;Age appropriate regular  Medication Administration:  Whole meds with puree (or single pills with small sips of liquids)    Other Recommendations Oral Care Recommendations: Oral care BID     Swallow Evaluation Recommendations  PO diet (regular/thin liquids)   Assistance Recommended at Discharge  TBD  Functional Status Assessment Patient has had a recent decline in their functional status and demonstrates the ability to make significant improvements in function in a reasonable and predictable amount of time.  Frequency and Duration min 1 x/week  1 week       Prognosis Prognosis for improved oropharyngeal function: Good      Swallow Study   General Date of Onset: 07/21/24 HPI: 63 y.o female presents to [facility] on 12/26 with SOB. Pt with active lung cancer on chemotherapy. Chest x-ray shows L pleural effusion. PMH includes HTN, HLD, DM, CVA with residual R sided hemiparesis and dysphagia, AV block, CKD III, CAD, depression, COPD.  ST consulted for clinical swallow evaluation. Type of Study: Bedside Swallow Evaluation Previous Swallow Assessment: 01/04/24 MBS with recs for Regular/thin liquids Diet Prior to this Study: NPO Temperature Spikes Noted: No Respiratory Status: Nasal cannula (2L) History of Recent Intubation: No Behavior/Cognition: Alert;Cooperative Oral Cavity Assessment: Within Functional Limits Oral Care Completed by SLP: Recent completion by staff Oral Cavity - Dentition: Adequate natural dentition Vision: Functional for self-feeding Self-Feeding Abilities: Able to feed self;Needs assist Patient Positioning: Upright in chair Baseline Vocal Quality: Low vocal intensity Volitional Cough: Strong Volitional Swallow: Able to elicit    Oral/Motor/Sensory Function Overall Oral Motor/Sensory Function: Within functional limits   Ice Chips Ice chips: Within functional limits Presentation: Self Fed   Thin Liquid Thin Liquid: Within functional limits Presentation: Cup;Straw    Nectar Thick Nectar Thick Liquid: Not tested   Honey  Thick Honey Thick Liquid: Not tested   Puree Puree: Within functional limits Presentation: Self Fed   Solid     Solid: Impaired Presentation: Self Fed Oral Phase Functional Implications: Impaired mastication Pharyngeal Phase Impairments: Multiple swallows      Pat Jsean Taussig,M.S.,[name] 07/22/2024,12:48 PM

## 2024-07-22 NOTE — Evaluation (Signed)
 Occupational Therapy Evaluation Patient Details Name: Kelly Gibson MRN: [id number] DOB: 05/11/1961 Today's Date: 07/22/2024   History of Present Illness   63 y.o female presents to [facility] on 12/26 with SOB. Pt with active lung cancer on chemotherapy. Chest x-ray shows L pleural effusion. 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     Clinical Impressions Pt reports having assist at baseline with ADLs, and pivot transfers to w/c independently. Pt currently with incr O2 needs, needs up to mod A for ADLs, and min A for transfers with 1 person HHA. Pt with residual R deficits from prior CVA, per spouse pt has resting hand splint at home, but pt does not wear as it comes off frequently due to incr tone. Issued palm guard and discussed wear with pt and spouse (4hr on/4hr off), pt reports it is comfortable, educated on hand hygiene as well for skin integrity. RN also notified. Pt presenting with impairments listed below, will follow acutely. Anticipate no OT follow up needs at d/c.      If plan is discharge home, recommend the following:   A lot of help with walking and/or transfers;A lot of help with bathing/dressing/bathroom;Assistance with cooking/housework;Direct supervision/assist for medications management;Direct supervision/assist for financial management;Assist for transportation;Help with stairs or ramp for entrance     Functional Status Assessment   Patient has had a recent decline in their functional status and demonstrates the ability to make significant improvements in function in a reasonable and predictable amount of time.     Equipment Recommendations   None recommended by OT     Recommendations for Other Services   PT consult     Precautions/Restrictions   Precautions Precautions: Fall Recall of Precautions/Restrictions: Impaired Restrictions Weight Bearing Restrictions Per  Provider Order: No     Mobility Bed Mobility               General bed mobility comments: OOB in chair upon arrival & departure    Transfers Overall transfer level: Needs assistance Equipment used: 1 person hand held assist Transfers: Sit to/from Stand, Bed to chair/wheelchair/BSC Sit to Stand: Min assist                  Balance Overall balance assessment: Needs assistance Sitting-balance support: Single extremity supported Sitting balance-Leahy Scale: Good Sitting balance - Comments: No sitting balance concerns. L UE provides support as needed, pt unable to mobilize R UE to support self EOB.   Standing balance support: Bilateral upper extremity supported Standing balance-Leahy Scale: Fair                             ADL either performed or assessed with clinical judgement   ADL Overall ADL's : Needs assistance/impaired Eating/Feeding: Set up   Grooming: Minimal assistance   Upper Body Bathing: Moderate assistance   Lower Body Bathing: Moderate assistance   Upper Body Dressing : Moderate assistance   Lower Body Dressing: Moderate assistance   Toilet Transfer: Moderate assistance   Toileting- Clothing Manipulation and Hygiene: Moderate assistance       Functional mobility during ADLs: Moderate assistance       Vision   Vision Assessment?: No apparent visual deficits     Perception Perception: Not tested       Praxis Praxis: Not tested       Pertinent Vitals/Pain Pain Assessment Pain Assessment: No/denies  pain     Extremity/Trunk Assessment Upper Extremity Assessment Upper Extremity Assessment: RUE deficits/detail RUE Deficits / Details: able to shrug shoulders, extensor tone in elbow, incr flexor tone in wrist and digits, passively opening hand with modA, PROM shoulder to 90*, nonfunctional RUE Sensation: decreased light touch;decreased proprioception RUE Coordination: decreased fine motor;decreased gross motor   Lower  Extremity Assessment Lower Extremity Assessment: Defer to PT evaluation RLE Deficits / Details: R sided hemiparesis. R LE remains in knee extension when sitting in chair, increased tone noted. R knee buckles repeatedly in standing, shifts weight onto L side for balance. Able to take small steps to chair with hand held support x2. RLE Coordination: decreased fine motor LLE Deficits / Details: Strength WFL.   Cervical / Trunk Assessment Cervical / Trunk Assessment: Normal   Communication Communication Communication: Impaired Factors Affecting Communication: Difficulty expressing self;Reduced clarity of speech (residual aphasia from CVA)   Cognition Arousal: Alert Behavior During Therapy: WFL for tasks assessed/performed Cognition: No apparent impairments                               Following commands: Intact       Cueing  General Comments   Cueing Techniques: Verbal cues;Tactile cues;Visual cues  VSS on supplemental o2   Exercises     Shoulder Instructions      Home Living Family/patient expects to be discharged to:: Private residence Living Arrangements: Spouse/significant other Available Help at Discharge: Family;Available 24 hours/day Type of Home: House Home Access: Ramped entrance Entrance Stairs-Number of Steps: 1   Home Layout: Two level;Able to live on main level with bedroom/bathroom Alternate Level Stairs-Number of Steps: Uses chair lift to go upstairs if needed   Bathroom Shower/Tub: Tub/shower unit   Bathroom Toilet: Handicapped height Bathroom Accessibility: Yes   Home Equipment: Wheelchair - manual;BSC/3in1;Grab bars - toilet;Shower seat;Grab bars - tub/shower;Electric scooter;Hospital bed Adaptive Equipment: Reacher;Long-handled sponge Additional Comments: does not wear O2 at home      Prior Functioning/Environment Prior Level of Function : Needs assist       Physical Assist : Mobility (physical);ADLs (physical) Mobility  (physical): Transfers;Gait;Bed mobility;Stairs ADLs (physical): Grooming;Bathing;Dressing;Toileting;IADLs Mobility Comments: Husband assists pt with transfers and short distance ambulation. Husband states she is able to tolerate short distances independently but often asks for his help anyways ADLs Comments: Husband helps with all ADLs. Expresses interest in hiring help for around the house so he can focus on caring for her.    OT Problem List: Decreased strength;Decreased range of motion;Decreased activity tolerance;Impaired balance (sitting and/or standing);Decreased cognition;Decreased safety awareness;Cardiopulmonary status limiting activity;Impaired UE functional use;Impaired tone;Impaired sensation   OT Treatment/Interventions: Self-care/ADL training;Therapeutic exercise;Energy conservation;DME and/or AE instruction;Therapeutic activities;Patient/family education;Balance training      OT Goals(Current goals can be found in the care plan section)   Acute Rehab OT Goals Patient Stated Goal: none stated OT Goal Formulation: With patient Time For Goal Achievement: 08/05/24 Potential to Achieve Goals: Good ADL Goals Pt Will Perform Upper Body Dressing: with caregiver independent in assisting;sitting Pt Will Perform Lower Body Dressing: with caregiver independent in assisting;sitting/lateral leans;sit to/from stand Pt Will Transfer to Toilet: with modified independence;stand pivot transfer;bedside commode Pt Will Perform Tub/Shower Transfer: Shower transfer;with modified independence;shower seat;Squat pivot transfer;Stand pivot transfer Additional ADL Goal #2: pt will tolerate R palm guard in order to prevent joint contractures and improve skin integrity   OT Frequency:  Min 2X/week    Co-evaluation  AM-PAC OT 6 Clicks Daily Activity     Outcome Measure Help from another person eating meals?: A Little Help from another person taking care of personal grooming?: A  Little Help from another person toileting, which includes using toliet, bedpan, or urinal?: A Lot Help from another person bathing (including washing, rinsing, drying)?: A Lot Help from another person to put on and taking off regular upper body clothing?: A Lot Help from another person to put on and taking off regular lower body clothing?: A Lot 6 Click Score: 14   End of Session Equipment Utilized During Treatment: Gait belt;Oxygen Nurse Communication: Mobility status (issued R palm guard)  Activity Tolerance: Patient tolerated treatment well Patient left: with call bell/phone within reach;in chair;with family/visitor present  OT Visit Diagnosis: Unsteadiness on feet (R26.81);Other abnormalities of gait and mobility (R26.89);Muscle weakness (generalized) (M62.81)                Time: 8874-8848 OT Time Calculation (min): 26 min Charges:  OT General Charges $OT Visit: 1 Visit OT Evaluation $OT Eval Low Complexity: 1 Low OT Treatments $Self Care/Home Management : 8-22 mins  Breannah Kratt K, OTD, OTR/L SecureChat Preferred Acute Rehab [phone]   Laneta POUR Koonce 07/22/2024, 12:49 PM

## 2024-07-22 NOTE — Progress Notes (Addendum)
 "                                                                                                                                                                                                                                                                                PROGRESS NOTE     Patient Demographics:    Kelly Gibson, is a 63 y.o. female, DOB - 1961-06-22, FMW:[id number]  Outpatient Primary MD for the patient is Early, [name], NP    LOS - 1  Admit date - 07/21/2024    Chief Complaint  Patient presents with   Respiratory Distress       Brief Narrative (HPI from H&P)   63 y.o. female with medical history significant of stage III NSCLC s/p chemotherapy (paclitaxel ) on active immunotherapy (Imfinzi ), PE on Eliquis , CAD (3 vessel disease but CABG deferred per pt), AV block, COPD, HTN, HLD, DM2, CVA with residual hemiparesis and dysphagia, and CKD3 who p/w SOB initially thought to be pneumonia further workup suggest possible CHF.   Subjective:    Kelly Gibson today has, No headache, No chest pain, No abdominal pain - No Nausea, No new weakness tingling or numbness, mild shortness of breath and orthopnea.  No cough.   Assessment  & Plan :    Acute hypoxic respiratory failure due to acute on chronic diastolic CHF EF 55% on recent echo, less likelihood of pneumonia.  SIRS due to CHF with hypoperfusion colic elevated lactic acid level.  Afebrile, no leukocytosis, no productive cough or exposure to sick contacts, does have orthopnea and edema, placed on high-dose IV Lasix , titrate down antibiotics, continue home Eliquis , advance activity and titrate down oxygen, encouraged to sit in chair use I-S and flutter valve.  Will monitor closely.  Currently on BiPAP will likely titrated down soon.  Follow cultures.  SpO2: 98 % O2 Flow Rate (L/min): 2 L/min FiO2 (%): 40 %  Aspiration H/o dysphagia Questionable aspiration SLP following, treatment as above and monitor.  Taper down to oral  antibiotics and monitor.   PE - Eliquis    HTN -HOLD IV fluid, Norvasc  scheduled, as needed hydralazine , holding ARB for now.   History of stroke with  right-sided hemiparesis, dysphagia and dysarthria.  Supportive care.    DM2 -Tresiba  15U nightly (pta 20/10) + SSI TID AC   CBG (last 3)  Recent Labs    07/21/24 1651 07/22/24 0711 07/22/24 1143  GLUCAP 238* 132* 192*         Condition -fair  Family Communication  : Husband bedside 07/22/2024  Code Status : Full code  Consults  : None  PUD Prophylaxis :     Procedures  :            Disposition Plan  :    Status is: Inpatient  DVT Prophylaxis  : Eliquis  resumed on 07/22/2024  Lab Results  Component Value Date   PLT 212 07/22/2024    Diet :  Diet Order             Diet regular Room service appropriate? Yes; Fluid consistency: Thin  Diet effective now                    Inpatient Medications  Scheduled Meds:  amoxicillin -clavulanate  1 tablet Oral Q12H   aspirin  EC  81 mg Oral Daily   budesonide -glycopyrrolate -formoterol   2 puff Inhalation BID   citalopram   10 mg Oral Daily   doxycycline   100 mg Oral Q12H   insulin  aspart  0-15 Units Subcutaneous TID WC   insulin  glargine  15 Units Subcutaneous Q2200   melatonin  5 mg Oral QHS   mouth rinse  15 mL Mouth Rinse 4 times per day   rosuvastatin   40 mg Oral Daily   Continuous Infusions:  cefTRIAXone  (ROCEPHIN )  IV Stopped (07/21/24 2122)   PRN Meds:.mouth rinse    Objective:   Vitals:   07/22/24 0700 07/22/24 0750 07/22/24 0800 07/22/24 1144  BP: (!) 152/67  (!) 158/66 136/80  Pulse:  (!) 47 (!) 50 63  Resp:  19 (!) 29 14  Temp: 98.3 F (36.8 C)   98.2 F (36.8 C)  TempSrc: Oral   Oral  SpO2:  99% 99% 98%  Weight:      Height:        Wt Readings from Last 3 Encounters:  07/21/24 86.5 kg  07/18/24 83.9 kg  06/20/24 84.4 kg     Intake/Output Summary (Last 24 hours) at 07/22/2024 1219 Last data filed at 07/22/2024  0532 Gross per 24 hour  Intake 1968.51 ml  Output --  Net 1968.51 ml     Physical Exam  Awake Alert, No new F.N deficits, chronic right-sided hemiparesis from previous stroke, dysarthria, [place].AT,PERRAL Supple Neck, No JVD,   Symmetrical Chest wall movement, Good air movement bilaterally, bibasilar crackles RRR,No Gallops,Rubs or new Murmurs,  +ve B.Sounds, Abd Soft, No tenderness,   Trace edema       Data Review:    Recent Labs  Lab 07/18/24 1028 07/21/24 0756 07/21/24 0932 07/21/24 0934 07/22/24 0634  WBC 4.6 7.8  --   --  10.1  HGB 10.7* 10.7* 9.5* 9.5* 9.0*  HCT 33.1* 35.1* 28.0* 28.0* 29.0*  PLT 242 244  --   --  212  MCV 86.9 93.6  --   --  91.2  MCH 28.1 28.5  --   --  28.3  MCHC 32.3 30.5  --   --  31.0  RDW 15.8* 16.4*  --   --  16.5*  LYMPHSABS 0.6* 0.9  --   --  0.4*  MONOABS 0.4 0.7  --   --  0.6  EOSABS  0.2 0.1  --   --  0.0  BASOSABS 0.0 0.0  --   --  0.0    Recent Labs  Lab 07/18/24 1028 07/21/24 0756 07/21/24 0932 07/21/24 0934 07/21/24 1151 07/21/24 1200 07/22/24 0634  NA 142 141 144 144  --   --  141  K 3.6 4.2 3.5 3.5  --   --  4.5  CL 107 107 110  --   --   --  111  CO2 24 18*  --   --   --   --  19*  ANIONGAP 11 16*  --   --   --   --  10  GLUCOSE 188* 249* 215*  --   --   --  143*  BUN 21 21 21   --   --   --  24*  CREATININE 1.81* 1.86* 1.80*  --   --   --  1.56*  AST 20 41  --   --   --   --   --   ALT 9 16  --   --   --   --   --   ALKPHOS 83 83  --   --   --   --   --   BILITOT 0.2 0.3  --   --   --   --   --   ALBUMIN 4.0 3.7  --   --   --   --   --   CRP  --   --   --   --   --   --  1.5*  PROCALCITON  --   --   --   --   --   --  1.69  LATICACIDVEN  --   --  4.0*  --  4.4*  --   --   INR  --  1.6*  --   --   --   --   --   TSH 1.710  --   --   --   --   --   --   HGBA1C  --   --   --   --   --  6.8*  --   MG  --  2.5*  --   --   --   --  2.7*  CALCIUM  9.4 9.0  --   --   --   --  8.7*      Recent Labs  Lab  07/18/24 1028 07/21/24 0756 07/21/24 0932 07/21/24 1151 07/21/24 1200 07/22/24 0634  CRP  --   --   --   --   --  1.5*  PROCALCITON  --   --   --   --   --  1.69  LATICACIDVEN  --   --  4.0* 4.4*  --   --   INR  --  1.6*  --   --   --   --   TSH 1.710  --   --   --   --   --   HGBA1C  --   --   --   --  6.8*  --   MG  --  2.5*  --   --   --  2.7*  CALCIUM  9.4 9.0  --   --   --  8.7*    --------------------------------------------------------------------------------------------------------------- Lab Results  Component Value Date   CHOL 159 01/10/2024   HDL 62 01/10/2024   LDLCALC 71 01/10/2024  LDLDIRECT 70 01/10/2024   TRIG 157 (H) 01/10/2024   CHOLHDL 2.9 01/30/2022    Lab Results  Component Value Date   HGBA1C 6.8 (H) 07/21/2024      Micro Results Recent Results (from the past 240 hours)  Urine Culture     Status: None   Collection Time: 07/18/24  2:30 PM   Specimen: Urine, Clean Catch  Result Value Ref Range Status   Specimen Description   Final    URINE, CLEAN CATCH Performed at [facility], [address], [place], [place] [address]    Special Requests   Final    NONE Performed at [facility], [address], [place], [place] [address]    Culture   Final    NO GROWTH Performed at [facility], [address], [place], [place] [address]    Report Status 07/19/2024 FINAL  Final  Blood Culture (routine x 2)     Status: None (Preliminary result)   Collection Time: 07/21/24  7:56 AM   Specimen: BLOOD  Result Value Ref Range Status   Specimen Description BLOOD RIGHT ANTECUBITAL  Final   Special Requests   Final    BOTTLES DRAWN AEROBIC AND ANAEROBIC Blood Culture adequate volume   Culture   Final    NO GROWTH 1 DAY Performed at [facility], [address] [place], [place] [address]    Report Status PENDING  Incomplete  Resp panel by RT-PCR (RSV, Flu A&B, Covid) Anterior Nasal Swab      Status: None   Collection Time: 07/21/24  7:56 AM   Specimen: Anterior Nasal Swab  Result Value Ref Range Status   SARS Coronavirus 2 by RT PCR NEGATIVE NEGATIVE Final   Influenza A by PCR NEGATIVE NEGATIVE Final   Influenza B by PCR NEGATIVE NEGATIVE Final    Comment: (NOTE) The Xpert Xpress SARS-CoV-2/FLU/RSV plus assay is intended as an aid in the diagnosis of influenza from Nasopharyngeal swab specimens and should not be used as a sole basis for treatment. Nasal washings and aspirates are unacceptable for Xpert Xpress SARS-CoV-2/FLU/RSV testing.  Fact Sheet for Patients: bloggercourse.com  Fact Sheet for Healthcare Providers: seriousbroker.it  This test is not yet approved or cleared by the [place]  FDA and has been authorized for detection and/or diagnosis of SARS-CoV-2 by FDA under an Emergency Use Authorization (EUA). This EUA will remain in effect (meaning this test can be used) for the duration of the COVID-19 declaration under Section 564(b)(1) of the Act, 21 U.S.C. section 360bbb-3(b)(1), unless the authorization is terminated or revoked.     Resp Syncytial Virus by PCR NEGATIVE NEGATIVE Final    Comment: (NOTE) Fact Sheet for Patients: bloggercourse.com  Fact Sheet for Healthcare Providers: seriousbroker.it  This test is not yet approved or cleared by the [place]  FDA and has been authorized for detection and/or diagnosis of SARS-CoV-2 by FDA under an Emergency Use Authorization (EUA). This EUA will remain in effect (meaning this test can be used) for the duration of the COVID-19 declaration under Section 564(b)(1) of the Act, 21 U.S.C. section 360bbb-3(b)(1), unless the authorization is terminated or revoked.  Performed at [facility], [address] [place], [place] [address]   Blood Culture (routine x 2)     Status: None (Preliminary  result)   Collection Time: 07/22/24  3:47 AM   Specimen: BLOOD RIGHT HAND  Result Value Ref Range Status   Specimen Description  BLOOD RIGHT HAND  Final   Special Requests   Final    BOTTLES DRAWN AEROBIC AND ANAEROBIC Blood Culture adequate volume   Culture   Final    NO GROWTH < 12 HOURS Performed at [facility], [address] [place], [place] [address]    Report Status PENDING  Incomplete    Radiology Report DG Chest Port 1 View Result Date: 07/22/2024 EXAM: 1 VIEW(S) XRAY OF THE CHEST 07/22/2024 07:07:00 AM COMPARISON: Portable chest dated 07/21/2024 06:20 PM. CLINICAL HISTORY: SOB (shortness of breath). FINDINGS: LINES, TUBES AND DEVICES: The right IJ port catheter terminates at the superior cavoatrial junction as before. LUNGS AND PLEURA: Left pleural effusion with dense consolidation or atelectasis in the left lower lung field continues to be seen. The combination of findings opacifies the lower half of the left chest. Remainder of the lungs are clear. Overall aeration seems unchanged. No pneumothorax. HEART AND MEDIASTINUM: The heart is enlarged. No vascular congestion is seen. There is calcification in the transverse aorta with a stable mediastinum. BONES AND SOFT TISSUES: No acute osseous abnormality. IMPRESSION: 1. Left pleural effusion with dense consolidation or atelectasis in the left lower lung field, opacifying the lower half of the left chest, without significant change. 2. Cardiomegaly without vascular congestion. Electronically signed by: Francis Quam MD 07/22/2024 07:56 AM EST RP Workstation: [id number]   DG Chest Port 1 View Result Date: 07/21/2024 EXAM: 1 VIEW(S) XRAY OF THE CHEST 07/21/2024 06:22:00 PM COMPARISON: 07/21/2024 CLINICAL HISTORY: resp distress FINDINGS: LINES, TUBES AND DEVICES: Right chest wall Port-A-Cath in place with tip terminating over the expected region of the right atrium. LUNGS AND PLEURA: Small to moderate left pleural effusion with hazy  opacity throughout the left lower hemithorax. Pulmonary edema. No pneumothorax. HEART AND MEDIASTINUM: Unchanged cardiomegaly. Atherosclerotic plaque. BONES AND SOFT TISSUES: No acute osseous abnormality. IMPRESSION: 1. Stable small to moderate left pleural effusion with left basilar atelectasis/airspace disease. 2. Pulmonary edema. 3. Unchanged cardiomegaly. 4. Right chest wall Port-A-Cath with tip projecting over the right atrium. Electronically signed by: Greig Pique MD 07/21/2024 08:14 PM EST RP Workstation: [id number]   DG Chest Port 1 View Result Date: 07/21/2024 CLINICAL DATA:  Sepsis EXAM: PORTABLE CHEST 1 VIEW COMPARISON:  January 12, 2024 FINDINGS: Mild cardiomegaly is noted. Right internal jugular Port-A-Cath is noted with distal tip in expected position of cavoatrial junction. Mild left basilar opacity is noted concerning for possible pneumonia or atelectasis with minimal associated pleural effusion. IMPRESSION: Mild left basilar opacity is noted concerning for possible pneumonia or atelectasis with minimal associated pleural effusion. Electronically Signed   By: Lynwood Landy Raddle M.D.   On: 07/21/2024 08:47     Signature  -   Lavada Stank M.D on 07/22/2024 at 12:19 PM   -  To page go to [email or web address]   "

## 2024-07-22 NOTE — Evaluation (Signed)
 Physical Therapy Evaluation Patient Details Name: Kelly Gibson MRN: [id number] DOB: [birth year] Today's Date: 07/22/2024  History of Present Illness  63 y.o female presents to [facility] on 12/26 with SOB. Pt with active lung cancer on chemotherapy. Chest x-ray shows L pleural effusion. 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   Clinical Impression  Pt is currently mobilizing below her baseline due to significant SOB with mobility. Pt with new baseline R hemiparesis and requires assist from husband for mobility and ADLs at home. Pt is CGA for bed mobility and becomes increasingly SOB upon sitting EOB. Pt encouraged to utilize pursed lip breathing techniques. MinAx2 via hand held assist provided for STS, pt tolerates standing ~3 minutes x2 bouts during session for peri care. Pt takes short shuffled steps to recliner hand held assist x2. Repeated R knee buckling noted in standing, pt primarily shifting weight towards  L LE for support. Pt would benefit from continued PT services focused on transfers, balance, and short distance gait as appropriate to improve activity tolerance and safety with mobility. Pt's husband would like to take pt home upon discharge, has necessary equipment needed at this time. Anticipate improvements in mobility with symptom improvement.        If plan is discharge home, recommend the following: A little help with walking and/or transfers;A lot of help with bathing/dressing/bathroom;Assistance with cooking/housework;Assist for transportation;Help with stairs or ramp for entrance   Can travel by private vehicle        Equipment Recommendations BSC/3in1 (Pt has all other necessary equipment at this time)  Recommendations for Other Services       Functional Status Assessment Patient has had a recent decline in their functional status and demonstrates the ability to make significant improvements  in function in a reasonable and predictable amount of time.     Precautions / Restrictions Precautions Precautions: Fall Recall of Precautions/Restrictions: Impaired Restrictions Weight Bearing Restrictions Per Provider Order: No      Mobility  Bed Mobility Overal bed mobility: Needs Assistance Bed Mobility: Supine to Sit     Supine to sit: Contact guard     General bed mobility comments: Increased time required, bed saturated and causing increased difficulty to pivot hips EOB. Pt significantly SOB upon sitting up EOB, SpO2 remains >95% on 4L.    Transfers Overall transfer level: Needs assistance Equipment used: 2 person hand held assist Transfers: Sit to/from Stand, Bed to chair/wheelchair/BSC Sit to Stand: Min assist   Step pivot transfers: Min assist       General transfer comment: R knee buckling upon standing, shifts weight to L LE for support. Pt tolerates standing ~3 minutes for peri care. STS x2 completed during session for peri care. Knee buckling improves during second attempt. Hand held assist x2 for balance. Takes 3 shuffled steps to turn to chair.    Ambulation/Gait                  Stairs            Wheelchair Mobility     Tilt Bed    Modified Rankin (Stroke Patients Only)       Balance Overall balance assessment: Needs assistance Sitting-balance support: Single extremity supported Sitting balance-Leahy Scale: Good Sitting balance - Comments: No sitting balance concerns. L UE provides support as needed, pt unable to mobilize R UE to support self EOB.   Standing balance support: Bilateral upper  extremity supported Standing balance-Leahy Scale: Fair Standing balance comment: Frequent R knee buckling. Benefits from minA hand held assist x2. No LOB noted despite knee buckling.                             Pertinent Vitals/Pain Pain Assessment Pain Assessment: No/denies pain    Home Living Family/patient expects to be  discharged to:: Private residence Living Arrangements: Spouse/significant other Available Help at Discharge: Family;Available 24 hours/day Type of Home: House Home Access: Ramped entrance   Entrance Stairs-Number of Steps: 1 Alternate Level Stairs-Number of Steps: Uses chair lift to go upstairs if needed Home Layout: Two level;Able to live on main level with bedroom/bathroom Home Equipment: Wheelchair - manual;BSC/3in1;Grab bars - toilet;Shower seat;Grab bars - tub/shower;Electric scooter;Hospital bed Additional Comments: does not wear O2 at home    Prior Function Prior Level of Function : Needs assist       Physical Assist : Mobility (physical);ADLs (physical) Mobility (physical): Transfers;Gait;Bed mobility;Stairs ADLs (physical): Grooming;Bathing;Dressing;Toileting;IADLs Mobility Comments: Husband assists pt with transfers and short distance ambulation. Husband states she is able to tolerate short distances independently but often asks for his help anyways ADLs Comments: Husband helps with all ADLs. Expresses interest in hiring help for around the house so he can focus on caring for her.     Extremity/Trunk Assessment   Upper Extremity Assessment Upper Extremity Assessment: Defer to OT evaluation    Lower Extremity Assessment Lower Extremity Assessment: RLE deficits/detail;LLE deficits/detail RLE Deficits / Details: R sided hemiparesis. R LE remains in knee extension when sitting in chair, increased tone noted. R knee buckles repeatedly in standing, shifts weight onto L side for balance. Able to take small steps to chair with hand held support x2. RLE Coordination: decreased fine motor LLE Deficits / Details: Strength WFL.    Cervical / Trunk Assessment Cervical / Trunk Assessment: Normal  Communication   Communication Communication: Impaired Factors Affecting Communication: Difficulty expressing self;Reduced clarity of speech (Pt with residual aphasia from prior CVA)     Cognition Arousal: Alert Behavior During Therapy: WFL for tasks assessed/performed   PT - Cognitive impairments: No apparent impairments                         Following commands: Intact       Cueing Cueing Techniques: Verbal cues, Tactile cues, Visual cues     General Comments General comments (skin integrity, edema, etc.): RR 20s-30s, cues for deep breathing. SpO2>95% throughout on 4L O2. Peri care provided x2 during session. No significant skin abnormalities noted.    Exercises     Assessment/Plan    PT Assessment Patient needs continued PT services  PT Problem List Decreased strength;Decreased mobility;Impaired tone;Decreased coordination;Decreased knowledge of precautions;Decreased activity tolerance;Decreased balance;Decreased knowledge of use of DME;Cardiopulmonary status limiting activity       PT Treatment Interventions DME instruction;Therapeutic exercise;Gait training;Balance training;Neuromuscular re-education;Functional mobility training;Therapeutic activities;Patient/family education;Wheelchair mobility training    PT Goals (Current goals can be found in the Care Plan section)  Acute Rehab PT Goals Patient Stated Goal: Improve breathing and go home PT Goal Formulation: With patient/family Time For Goal Achievement: 08/05/24 Potential to Achieve Goals: Good    Frequency Min 1X/week     Co-evaluation               AM-PAC PT 6 Clicks Mobility  Outcome Measure Help needed turning from your back to your side while  in a flat bed without using bedrails?: None Help needed moving from lying on your back to sitting on the side of a flat bed without using bedrails?: None Help needed moving to and from a bed to a chair (including a wheelchair)?: A Little Help needed standing up from a chair using your arms (e.g., wheelchair or bedside chair)?: A Little Help needed to walk in hospital room?: A Lot Help needed climbing 3-5 steps with a railing? :  Total 6 Click Score: 17    End of Session Equipment Utilized During Treatment: Oxygen Activity Tolerance: Other (comment) (Pt limtied by SOB) Patient left: in chair;with call bell/phone within reach;with chair alarm set;with family/visitor present Nurse Communication: Mobility status PT Visit Diagnosis: Unsteadiness on feet (R26.81);Other abnormalities of gait and mobility (R26.89)    Time: 1001-1048 PT Time Calculation (min) (ACUTE ONLY): 47 min   Charges:   PT Evaluation $PT Eval Moderate Complexity: 1 Mod PT Treatments $Therapeutic Activity: 23-37 mins PT General Charges $$ ACUTE PT VISIT: 1 Visit         Sabra Morel, PT, DPT  Acute Rehabilitation Services         Office: [phone]     Sabra MARLA Morel 07/22/2024, 12:22 PM

## 2024-07-23 ENCOUNTER — Other Ambulatory Visit (HOSPITAL_COMMUNITY): Payer: Self-pay

## 2024-07-23 DIAGNOSIS — J189 Pneumonia, unspecified organism: Secondary | ICD-10-CM | POA: Diagnosis not present

## 2024-07-23 LAB — CBC WITH DIFFERENTIAL/PLATELET
Abs Immature Granulocytes: 0.04 K/uL (ref 0.00–0.07)
Basophils Absolute: 0 K/uL (ref 0.0–0.1)
Basophils Relative: 0 %
Eosinophils Absolute: 0.2 K/uL (ref 0.0–0.5)
Eosinophils Relative: 2 %
HCT: 32.1 % — ABNORMAL LOW (ref 36.0–46.0)
Hemoglobin: 10 g/dL — ABNORMAL LOW (ref 12.0–15.0)
Immature Granulocytes: 0 %
Lymphocytes Relative: 4 %
Lymphs Abs: 0.4 K/uL — ABNORMAL LOW (ref 0.7–4.0)
MCH: 27.9 pg (ref 26.0–34.0)
MCHC: 31.2 g/dL (ref 30.0–36.0)
MCV: 89.7 fL (ref 80.0–100.0)
Monocytes Absolute: 0.9 K/uL (ref 0.1–1.0)
Monocytes Relative: 9 %
Neutro Abs: 8.3 K/uL — ABNORMAL HIGH (ref 1.7–7.7)
Neutrophils Relative %: 85 %
Platelets: 236 K/uL (ref 150–400)
RBC: 3.58 MIL/uL — ABNORMAL LOW (ref 3.87–5.11)
RDW: 16.6 % — ABNORMAL HIGH (ref 11.5–15.5)
WBC: 9.8 K/uL (ref 4.0–10.5)
nRBC: 0 % (ref 0.0–0.2)

## 2024-07-23 LAB — MAGNESIUM: Magnesium: 2.3 mg/dL (ref 1.7–2.4)

## 2024-07-23 LAB — RESPIRATORY PANEL BY PCR

## 2024-07-23 LAB — PHOSPHORUS: Phosphorus: 2 mg/dL — ABNORMAL LOW (ref 2.5–4.6)

## 2024-07-23 LAB — GLUCOSE, CAPILLARY
Glucose-Capillary: 116 mg/dL — ABNORMAL HIGH (ref 70–99)
Glucose-Capillary: 196 mg/dL — ABNORMAL HIGH (ref 70–99)
Glucose-Capillary: 141 mg/dL — ABNORMAL HIGH (ref 70–99)
Glucose-Capillary: 161 mg/dL — ABNORMAL HIGH (ref 70–99)

## 2024-07-23 LAB — C-REACTIVE PROTEIN: CRP: 1.5 mg/dL — ABNORMAL HIGH

## 2024-07-23 LAB — PROCALCITONIN: Procalcitonin: 1.17 ng/mL

## 2024-07-23 MED ORDER — POTASSIUM PHOSPHATES 15 MMOLE/5ML IV SOLN
30.0000 mmol | Freq: Once | INTRAVENOUS | Status: AC
  Administered 2024-07-23: 30 mmol via INTRAVENOUS
  Filled 2024-07-23: qty 10

## 2024-07-23 MED ORDER — UMECLIDINIUM BROMIDE 62.5 MCG/ACT IN AEPB
1.0000 | INHALATION_SPRAY | Freq: Every day | RESPIRATORY_TRACT | Status: DC
  Administered 2024-07-24: 1 via RESPIRATORY_TRACT
  Filled 2024-07-23: qty 7

## 2024-07-23 MED ORDER — FLUTICASONE FUROATE-VILANTEROL 200-25 MCG/ACT IN AEPB: 1.0000 | INHALATION_SPRAY | Freq: Every day | RESPIRATORY_TRACT | Status: DC

## 2024-07-23 MED ORDER — CARVEDILOL 3.125 MG PO TABS
3.1250 mg | ORAL_TABLET | Freq: Two times a day (BID) | ORAL | Status: DC
  Filled 2024-07-23: qty 1

## 2024-07-23 MED ORDER — FUROSEMIDE 10 MG/ML IJ SOLN
20.0000 mg | Freq: Once | INTRAMUSCULAR | Status: AC
  Administered 2024-07-23: 20 mg via INTRAVENOUS
  Filled 2024-07-23: qty 2

## 2024-07-23 NOTE — Plan of Care (Signed)
  Problem: Nutritional: Goal: Maintenance of adequate nutrition will improve Outcome: Progressing   Problem: Skin Integrity: Goal: Risk for impaired skin integrity will decrease Outcome: Progressing   Problem: Clinical Measurements: Goal: Diagnostic test results will improve Outcome: Progressing Goal: Respiratory complications will improve Outcome: Progressing

## 2024-07-23 NOTE — Progress Notes (Signed)
 "                                                                                                                                                                                                                                                                                PROGRESS NOTE     Patient Demographics:    Kelly Gibson, is a 63 y.o. female, DOB - 06/20/1961, FMW:[id number]  Outpatient Primary MD for the patient is Early, [name], NP    LOS - 2  Admit date - 07/21/2024    Chief Complaint  Patient presents with   Respiratory Distress       Brief Narrative (HPI from H&P)   63 y.o. female with medical history significant of stage III NSCLC s/p chemotherapy (paclitaxel ) on active immunotherapy (Imfinzi ), PE on Eliquis , CAD (3 vessel disease but CABG deferred per pt), AV block, COPD, HTN, HLD, DM2, CVA with residual hemiparesis and dysphagia, and CKD3 who p/w SOB initially thought to be pneumonia further workup suggest possible CHF.   Subjective:   Patient in bed, appears comfortable, denies any headache, no fever, no chest pain or pressure, no shortness of breath , no abdominal pain. No new focal weakness.    Assessment  & Plan :    Acute hypoxic respiratory failure due to acute on chronic diastolic CHF EF 55% on recent echo, less likelihood of pneumonia.  SIRS due to CHF with hypoperfusion colic elevated lactic acid level.  Afebrile, no leukocytosis, no productive cough or exposure to sick contacts, does have orthopnea and edema, placed on high-dose IV Lasix  on 07/22/2024 with excellent results, much better on 07/23/2024, titrate down antibiotics, continue home Eliquis , advance activity and titrate down oxygen, encouraged to sit in chair use I-S and flutter valve.  Liberated off of BiPAP,  Follow cultures.  SpO2: 99 % O2 Flow Rate (L/min): (S) 3 L/min FiO2 (%): 40 %  Aspiration PNA - H/o dysphagia  Questionable aspiration SLP following, treatment as above and monitor.  Taper down  to oral antibiotics and monitor.   PE  - Eliquis    HTN  -HOLD IV fluid, placed on Norvasc , add Coreg  for better control, continue as needed  IV hydralazine .   History of stroke with right-sided hemiparesis, dysphagia and dysarthria.  Supportive care.    DM2  -Tresiba  15U nightly (pta 20/10) + SSI TID AC   CBG (last 3)  Recent Labs    07/22/24 1638 07/22/24 2108 07/23/24 0736  GLUCAP 102* 203* 116*         Condition -fair  Family Communication  : Husband bedside 07/22/2024, 07/23/24  Code Status : Full code  Consults  : None  PUD Prophylaxis :     Procedures  :            Disposition Plan  :    Status is: Inpatient  DVT Prophylaxis  : Eliquis  resumed on 07/22/2024  Lab Results  Component Value Date   PLT 236 07/23/2024    Diet :  Diet Order             Diet regular Room service appropriate? Yes; Fluid consistency: Thin  Diet effective now                    Inpatient Medications  Scheduled Meds:  amLODipine   10 mg Oral Daily   amoxicillin -clavulanate  1 tablet Oral Q12H   apixaban   5 mg Oral BID   aspirin  EC  81 mg Oral Daily   budesonide -glycopyrrolate -formoterol   2 puff Inhalation BID   citalopram   10 mg Oral Daily   doxycycline   100 mg Oral Q12H   furosemide   20 mg Intravenous Once   insulin  aspart  0-15 Units Subcutaneous TID WC   insulin  glargine  15 Units Subcutaneous Q2200   melatonin  5 mg Oral QHS   mouth rinse  15 mL Mouth Rinse 4 times per day   rosuvastatin   40 mg Oral Daily   Continuous Infusions:  cefTRIAXone  (ROCEPHIN )  IV Stopped (07/22/24 2040)   potassium PHOSPHATE  IVPB (in mmol)     PRN Meds:.hydrALAZINE , mouth rinse    Objective:   Vitals:   07/23/24 0508 07/23/24 0600 07/23/24 0700 07/23/24 0800  BP: (!) 165/70 (!) 121/51  (!) 150/58  Pulse:  (!) 51    Resp:  19    Temp:   98.1 F (36.7 C)   TempSrc:   Oral   SpO2:      Weight:      Height:        Wt Readings from Last 3 Encounters:  07/21/24 86.5  kg  07/18/24 83.9 kg  06/20/24 84.4 kg     Intake/Output Summary (Last 24 hours) at 07/23/2024 0841 Last data filed at 07/23/2024 0507 Gross per 24 hour  Intake --  Output 1750 ml  Net -1750 ml     Physical Exam  Awake Alert, No new F.N deficits, chronic right-sided hemiparesis from previous stroke, dysarthria, [place].AT,PERRAL Supple Neck, No JVD,   Symmetrical Chest wall movement, Good air movement bilaterally, minimal bibasilar crackles RRR,No Gallops,Rubs or new Murmurs,  +ve B.Sounds, Abd Soft, No tenderness,   Trace edema       Data Review:    Recent Labs  Lab 07/18/24 1028 07/21/24 0756 07/21/24 0932 07/21/24 0934 07/22/24 0634 07/23/24 0614  WBC 4.6 7.8  --   --  10.1 9.8  HGB 10.7* 10.7* 9.5* 9.5* 9.0* 10.0*  HCT 33.1* 35.1* 28.0* 28.0* 29.0* 32.1*  PLT 242 244  --   --  212 236  MCV 86.9 93.6  --   --  91.2 89.7  MCH 28.1 28.5  --   --  28.3 27.9  MCHC 32.3 30.5  --   --  31.0 31.2  RDW 15.8* 16.4*  --   --  16.5* 16.6*  LYMPHSABS 0.6* 0.9  --   --  0.4* 0.4*  MONOABS 0.4 0.7  --   --  0.6 0.9  EOSABS 0.2 0.1  --   --  0.0 0.2  BASOSABS 0.0 0.0  --   --  0.0 0.0    Recent Labs  Lab 07/18/24 1028 07/21/24 0756 07/21/24 0932 07/21/24 0934 07/21/24 1151 07/21/24 1200 07/22/24 0634 07/23/24 0614  NA 142 141 144 144  --   --  141  --   K 3.6 4.2 3.5 3.5  --   --  4.5  --   CL 107 107 110  --   --   --  111  --   CO2 24 18*  --   --   --   --  19*  --   ANIONGAP 11 16*  --   --   --   --  10  --   GLUCOSE 188* 249* 215*  --   --   --  143*  --   BUN 21 21 21   --   --   --  24*  --   CREATININE 1.81* 1.86* 1.80*  --   --   --  1.56*  --   AST 20 41  --   --   --   --   --   --   ALT 9 16  --   --   --   --   --   --   ALKPHOS 83 83  --   --   --   --   --   --   BILITOT 0.2 0.3  --   --   --   --   --   --   ALBUMIN 4.0 3.7  --   --   --   --   --   --   CRP  --   --   --   --   --   --  1.5* 1.5*  PROCALCITON  --   --   --   --   --   --   1.69 1.17  LATICACIDVEN  --   --  4.0*  --  4.4*  --   --   --   INR  --  1.6*  --   --   --   --   --   --   TSH 1.710  --   --   --   --   --   --   --   HGBA1C  --   --   --   --   --  6.8*  --   --   MG  --  2.5*  --   --   --   --  2.7* 2.3  PHOS  --   --   --   --   --   --   --  2.0*  CALCIUM  9.4 9.0  --   --   --   --  8.7*  --       Recent Labs  Lab 07/18/24 1028 07/21/24 0756 07/21/24 0932 07/21/24 1151 07/21/24 1200 07/22/24 0634 07/23/24 0614  CRP  --   --   --   --   --  1.5* 1.5*  PROCALCITON  --   --   --   --   --  1.69 1.17  LATICACIDVEN  --   --  4.0* 4.4*  --   --   --   INR  --  1.6*  --   --   --   --   --   TSH 1.710  --   --   --   --   --   --   HGBA1C  --   --   --   --  6.8*  --   --   MG  --  2.5*  --   --   --  2.7* 2.3  CALCIUM  9.4 9.0  --   --   --  8.7*  --     --------------------------------------------------------------------------------------------------------------- Lab Results  Component Value Date   CHOL 159 01/10/2024   HDL 62 01/10/2024   LDLCALC 71 01/10/2024   LDLDIRECT 70 01/10/2024   TRIG 157 (H) 01/10/2024   CHOLHDL 2.9 01/30/2022    Lab Results  Component Value Date   HGBA1C 6.8 (H) 07/21/2024      Micro Results Recent Results (from the past 240 hours)  Urine Culture     Status: None   Collection Time: 07/18/24  2:30 PM   Specimen: Urine, Clean Catch  Result Value Ref Range Status   Specimen Description   Final    URINE, CLEAN CATCH Performed at [facility], [address] [place], [place] [address]    Special Requests   Final    NONE Performed at [facility], [address], [place], [place] [address]    Culture   Final    NO GROWTH Performed at [facility], [address], [place], [place] [address]    Report Status 07/19/2024 FINAL  Final  Blood Culture (routine x 2)     Status: None (Preliminary result)   Collection Time: 07/21/24  7:56 AM    Specimen: BLOOD  Result Value Ref Range Status   Specimen Description BLOOD RIGHT ANTECUBITAL  Final   Special Requests   Final    BOTTLES DRAWN AEROBIC AND ANAEROBIC Blood Culture adequate volume   Culture   Final    NO GROWTH 1 DAY Performed at [facility], [address] [place], [place] [address]    Report Status PENDING  Incomplete  Resp panel by RT-PCR (RSV, Flu A&B, Covid) Anterior Nasal Swab     Status: None   Collection Time: 07/21/24  7:56 AM   Specimen: Anterior Nasal Swab  Result Value Ref Range Status   SARS Coronavirus 2 by RT PCR NEGATIVE NEGATIVE Final   Influenza A by PCR NEGATIVE NEGATIVE Final   Influenza B by PCR NEGATIVE NEGATIVE Final    Comment: (NOTE) The Xpert Xpress SARS-CoV-2/FLU/RSV plus assay is intended as an aid in the diagnosis of influenza from Nasopharyngeal swab specimens and should not be used as a sole basis for treatment. Nasal washings and aspirates are unacceptable for Xpert Xpress SARS-CoV-2/FLU/RSV testing.  Fact Sheet for Patients: bloggercourse.com  Fact Sheet for Healthcare Providers: seriousbroker.it  This test is not yet approved or cleared by the [place]  FDA and has been authorized for detection and/or diagnosis of SARS-CoV-2 by FDA under an Emergency Use Authorization (EUA). This EUA will remain in effect (meaning this test can be used) for the duration of the COVID-19 declaration under Section 564(b)(1) of the Act, 21 U.S.C. section 360bbb-3(b)(1), unless the authorization is terminated or revoked.     Resp Syncytial  Virus by PCR NEGATIVE NEGATIVE Final    Comment: (NOTE) Fact Sheet for Patients: bloggercourse.com  Fact Sheet for Healthcare Providers: seriousbroker.it  This test is not yet approved or cleared by the [place]  FDA and has been authorized for detection and/or diagnosis of SARS-CoV-2 by FDA  under an Emergency Use Authorization (EUA). This EUA will remain in effect (meaning this test can be used) for the duration of the COVID-19 declaration under Section 564(b)(1) of the Act, 21 U.S.C. section 360bbb-3(b)(1), unless the authorization is terminated or revoked.  Performed at [facility], [address], [place], [place] [address]   Blood Culture (routine x 2)     Status: None (Preliminary result)   Collection Time: 07/22/24  3:47 AM   Specimen: BLOOD RIGHT HAND  Result Value Ref Range Status   Specimen Description BLOOD RIGHT HAND  Final   Special Requests   Final    BOTTLES DRAWN AEROBIC AND ANAEROBIC Blood Culture adequate volume   Culture   Final    NO GROWTH < 12 HOURS Performed at [facility], [address], [place], [place] [address]    Report Status PENDING  Incomplete    Radiology Report DG Chest Port 1 View Result Date: 07/22/2024 EXAM: 1 VIEW(S) XRAY OF THE CHEST 07/22/2024 07:07:00 AM COMPARISON: Portable chest dated 07/21/2024 06:20 PM. CLINICAL HISTORY: SOB (shortness of breath). FINDINGS: LINES, TUBES AND DEVICES: The right IJ port catheter terminates at the superior cavoatrial junction as before. LUNGS AND PLEURA: Left pleural effusion with dense consolidation or atelectasis in the left lower lung field continues to be seen. The combination of findings opacifies the lower half of the left chest. Remainder of the lungs are clear. Overall aeration seems unchanged. No pneumothorax. HEART AND MEDIASTINUM: The heart is enlarged. No vascular congestion is seen. There is calcification in the transverse aorta with a stable mediastinum. BONES AND SOFT TISSUES: No acute osseous abnormality. IMPRESSION: 1. Left pleural effusion with dense consolidation or atelectasis in the left lower lung field, opacifying the lower half of the left chest, without significant change. 2. Cardiomegaly without vascular congestion. Electronically signed by: Francis Quam MD  07/22/2024 07:56 AM EST RP Workstation: [id number]   DG Chest Port 1 View Result Date: 07/21/2024 EXAM: 1 VIEW(S) XRAY OF THE CHEST 07/21/2024 06:22:00 PM COMPARISON: 07/21/2024 CLINICAL HISTORY: resp distress FINDINGS: LINES, TUBES AND DEVICES: Right chest wall Port-A-Cath in place with tip terminating over the expected region of the right atrium. LUNGS AND PLEURA: Small to moderate left pleural effusion with hazy opacity throughout the left lower hemithorax. Pulmonary edema. No pneumothorax. HEART AND MEDIASTINUM: Unchanged cardiomegaly. Atherosclerotic plaque. BONES AND SOFT TISSUES: No acute osseous abnormality. IMPRESSION: 1. Stable small to moderate left pleural effusion with left basilar atelectasis/airspace disease. 2. Pulmonary edema. 3. Unchanged cardiomegaly. 4. Right chest wall Port-A-Cath with tip projecting over the right atrium. Electronically signed by: Greig Pique MD 07/21/2024 08:14 PM EST RP Workstation: [id number]     Signature  -   Lavada Stank M.D on 07/23/2024 at 8:41 AM   -  To page go to [email or web address]   "

## 2024-07-23 NOTE — Plan of Care (Signed)
" °  Problem: Coping: Goal: Ability to adjust to condition or change in health will improve Outcome: Progressing   Problem: Nutritional: Goal: Maintenance of adequate nutrition will improve Outcome: Progressing   Problem: Clinical Measurements: Goal: Will remain free from infection Outcome: Progressing Goal: Respiratory complications will improve Outcome: Progressing   Problem: Activity: Goal: Risk for activity intolerance will decrease Outcome: Progressing   Problem: Safety: Goal: Ability to remain free from injury will improve Outcome: Progressing   "

## 2024-07-24 ENCOUNTER — Other Ambulatory Visit (HOSPITAL_COMMUNITY): Payer: Self-pay

## 2024-07-24 LAB — CBC WITH DIFFERENTIAL/PLATELET
Abs Immature Granulocytes: 0.03 K/uL (ref 0.00–0.07)
Basophils Absolute: 0 K/uL (ref 0.0–0.1)
Basophils Relative: 1 %
Eosinophils Absolute: 0.3 K/uL (ref 0.0–0.5)
Eosinophils Relative: 5 %
HCT: 34.1 % — ABNORMAL LOW (ref 36.0–46.0)
Hemoglobin: 10.8 g/dL — ABNORMAL LOW (ref 12.0–15.0)
Immature Granulocytes: 1 %
Lymphocytes Relative: 6 %
Lymphs Abs: 0.4 K/uL — ABNORMAL LOW (ref 0.7–4.0)
MCH: 28.3 pg (ref 26.0–34.0)
MCHC: 31.7 g/dL (ref 30.0–36.0)
MCV: 89.3 fL (ref 80.0–100.0)
Monocytes Absolute: 0.7 K/uL (ref 0.1–1.0)
Monocytes Relative: 11 %
Neutro Abs: 4.8 K/uL (ref 1.7–7.7)
Neutrophils Relative %: 76 %
Platelets: 245 K/uL (ref 150–400)
RBC: 3.82 MIL/uL — ABNORMAL LOW (ref 3.87–5.11)
RDW: 16.2 % — ABNORMAL HIGH (ref 11.5–15.5)
WBC: 6.3 K/uL (ref 4.0–10.5)
nRBC: 0.3 % — ABNORMAL HIGH (ref 0.0–0.2)

## 2024-07-24 LAB — C-REACTIVE PROTEIN: CRP: 3.1 mg/dL — ABNORMAL HIGH

## 2024-07-24 LAB — BASIC METABOLIC PANEL WITH GFR
Anion gap: 11 (ref 5–15)
BUN: 20 mg/dL (ref 8–23)
CO2: 23 mmol/L (ref 22–32)
Calcium: 9.1 mg/dL (ref 8.9–10.3)
Chloride: 108 mmol/L (ref 98–111)
Creatinine, Ser: 1.38 mg/dL — ABNORMAL HIGH (ref 0.44–1.00)
GFR, Estimated: 43 mL/min — ABNORMAL LOW
Glucose, Bld: 133 mg/dL — ABNORMAL HIGH (ref 70–99)
Potassium: 3.7 mmol/L (ref 3.5–5.1)
Sodium: 142 mmol/L (ref 135–145)

## 2024-07-24 LAB — MAGNESIUM: Magnesium: 2.2 mg/dL (ref 1.7–2.4)

## 2024-07-24 LAB — GLUCOSE, CAPILLARY: Glucose-Capillary: 152 mg/dL — ABNORMAL HIGH (ref 70–99)

## 2024-07-24 LAB — PHOSPHORUS: Phosphorus: 3.2 mg/dL (ref 2.5–4.6)

## 2024-07-24 LAB — PROCALCITONIN: Procalcitonin: 0.73 ng/mL

## 2024-07-24 MED ORDER — AMLODIPINE BESYLATE 10 MG PO TABS
10.0000 mg | ORAL_TABLET | Freq: Every day | ORAL | 0 refills | Status: AC
  Filled 2024-07-24: qty 30, 30d supply, fill #0

## 2024-07-24 MED ORDER — DOXYCYCLINE HYCLATE 100 MG PO TABS
100.0000 mg | ORAL_TABLET | Freq: Two times a day (BID) | ORAL | 0 refills | Status: DC
  Filled 2024-07-24: qty 6, 3d supply, fill #0

## 2024-07-24 MED ORDER — FUROSEMIDE 20 MG PO TABS
20.0000 mg | ORAL_TABLET | Freq: Every day | ORAL | 0 refills | Status: AC
  Filled 2024-07-24: qty 30, 30d supply, fill #0

## 2024-07-24 MED ORDER — FUROSEMIDE 10 MG/ML IJ SOLN
20.0000 mg | Freq: Once | INTRAMUSCULAR | Status: AC
  Administered 2024-07-24: 20 mg via INTRAVENOUS
  Filled 2024-07-24: qty 2

## 2024-07-24 MED ORDER — CARVEDILOL 3.125 MG PO TABS
3.1250 mg | ORAL_TABLET | Freq: Two times a day (BID) | ORAL | 0 refills | Status: DC
  Filled 2024-07-24: qty 60, 30d supply, fill #0

## 2024-07-24 MED ORDER — POTASSIUM CHLORIDE CRYS ER 10 MEQ PO TBCR
10.0000 meq | EXTENDED_RELEASE_TABLET | Freq: Every day | ORAL | 0 refills | Status: AC
  Filled 2024-07-24: qty 30, 30d supply, fill #0

## 2024-07-24 MED ORDER — AMOXICILLIN-POT CLAVULANATE 875-125 MG PO TABS
1.0000 | ORAL_TABLET | Freq: Two times a day (BID) | ORAL | 0 refills | Status: AC
  Filled 2024-07-24: qty 6, 3d supply, fill #0

## 2024-07-24 MED ORDER — OLMESARTAN MEDOXOMIL 20 MG PO TABS
20.0000 mg | ORAL_TABLET | Freq: Every morning | ORAL | 0 refills | Status: AC
  Filled 2024-07-24: qty 30, 30d supply, fill #0

## 2024-07-24 MED ORDER — POTASSIUM CHLORIDE CRYS ER 20 MEQ PO TBCR
40.0000 meq | EXTENDED_RELEASE_TABLET | Freq: Once | ORAL | Status: AC
  Administered 2024-07-24: 40 meq via ORAL
  Filled 2024-07-24: qty 2

## 2024-07-24 NOTE — Discharge Instructions (Addendum)
 Follow with Primary MD Early, [name], NP and your cardiologist in 7 days   Get CBC, CMP, Magnesium , 2 view Chest X ray -  checked next visit with your primary MD   Activity: As tolerated with Full fall precautions use walker/cane & assistance as needed  Disposition Home    Diet: Heart Healthy, low carbohydrate diet.  Check CBGs q. ACHS.  Check your Weight same time everyday, if you gain over 2 pounds, or you develop in leg swelling, experience more shortness of breath or chest pain, call your Primary MD immediately. Follow Cardiac Low Salt Diet and 1.5 lit/day fluid restriction.  Special Instructions: If you have smoked or chewed Tobacco  in the last 2 yrs please stop smoking, stop any regular Alcohol  and or any Recreational drug use.  On your next visit with your primary care physician please Get Medicines reviewed and adjusted.  Please request your Prim.MD to go over all Hospital Tests and Procedure/Radiological results at the follow up, please get all Hospital records sent to your Prim MD by signing hospital release before you go home.  If you experience worsening of your admission symptoms, develop shortness of breath, life threatening emergency, suicidal or homicidal thoughts you must seek medical attention immediately by calling 911 or calling your MD immediately  if symptoms less severe.  You Must read complete instructions/literature along with all the possible adverse reactions/side effects for all the Medicines you take and that have been prescribed to you. Take any new Medicines after you have completely understood and accpet all the possible adverse reactions/side effects.   Do not drive when taking Pain medications.  Do not take more than prescribed Pain, Sleep and Anxiety Medications  Wear Seat belts while driving.

## 2024-07-24 NOTE — Progress Notes (Signed)
 Discharge instructions (including medications) discussed with and copy provided to patient/caregiver.  Patient picking up TOC meds on her way out.

## 2024-07-24 NOTE — TOC Transition Note (Signed)
 Transition of Care [facility]) - Discharge Note   Patient Details  Name: Kelly Gibson MRN: [id number] Date of Birth: December 31, 1960  Transition of Care [facility]) CM/SW Contact:  Landry DELENA Senters, RN Phone Number: 07/24/2024, 8:22 AM   Clinical Narrative:    Patient will be discharging home today, with husband providing transportation.   Therapy rec for HHPT and BSC. Patient declines both of these. CM did call husband to verify, and he confirms they do not want HHPT and patient already has BSC at home.   No further needs identified by CM.   Final next level of care: Home/Self Care Barriers to Discharge: No Barriers Identified   Patient Goals and CMS Choice            Discharge Placement                       Discharge Plan and Services Additional resources added to the After Visit Summary for                                       Social Drivers of Health (SDOH) Interventions SDOH Screenings   Food Insecurity: No Food Insecurity (07/21/2024)  Housing: Low Risk (07/21/2024)  Transportation Needs: No Transportation Needs (07/21/2024)  Utilities: Not At Risk (07/21/2024)  Alcohol Screen: Low Risk (04/18/2024)  Depression (PHQ2-9): Low Risk (05/18/2024)  Recent Concern: Depression (PHQ2-9) - Medium Risk (05/09/2024)  Financial Resource Strain: Low Risk (04/18/2024)  Physical Activity: Inactive (04/18/2024)  Social Connections: Socially Isolated (04/18/2024)  Stress: No Stress Concern Present (04/18/2024)  Tobacco Use: Medium Risk (07/21/2024)  Health Literacy: Adequate Health Literacy (04/18/2024)     Readmission Risk Interventions     No data to display

## 2024-07-24 NOTE — Discharge Summary (Signed)
 "                                                                                                                                                                               Discharge summary note.  Kelly Gibson FMW:[id number] DOB: 1961-04-17 DOA: 07/21/2024  PCP: Oris [name], NP  Admit date: 07/21/2024  Discharge date: 07/24/2024  Admitted From: Home   Disposition:  Home   Recommendations for Outpatient Follow-up:   Follow up with PCP in 1-2 weeks  PCP Please obtain BMP/CBC, 2 view CXR in 1week,  (see Discharge instructions)   PCP Please follow up on the following pending results:    Home Health: PT, RN if qualifies   Equipment/Devices: None  Consultations: None  Discharge Condition: Stable    CODE STATUS: Full    Diet Recommendation: Heart Healthy low carbohydrate diet with strict 1.5 L fluid restriction per day    Chief Complaint  Patient presents with   Respiratory Distress     Brief history of present illness from the day of admission and additional interim summary    63 y.o. female with medical history significant of stage III NSCLC s/p chemotherapy (paclitaxel ) on active immunotherapy (Imfinzi ), PE on Eliquis , CAD (3 vessel disease but CABG deferred per pt), AV block, COPD, HTN, HLD, DM2, CVA with residual hemiparesis and dysphagia, and CKD3 who p/w SOB initially thought to be pneumonia further workup suggest possible CHF.                                                                  Hospital Course   Acute hypoxic respiratory failure due to acute on chronic diastolic CHF EF 55% on recent echo, less likelihood of pneumonia.  SIRS due to CHF with hypoperfusion colic elevated lactic acid level.  Initially required BiPAP, now afebrile, no leukocytosis, no productive cough or exposure to sick contacts, does have orthopnea and edema, she was placed on high-dose IV Lasix  with excellent results, now symptom-free on room air, remains asymptomatic, wants to go home  will be discharged home on strict fluid restriction and low-dose daily Lasix , PCP to monitor BMP and diuretic dose closely along with blood pressure.  Will benefit from outpatient cardiology follow-up in 1 to 2 weeks.   Aspiration PNA - H/o dysphagia  Questionable aspiration SLP following, treatment as above and monitor.  Has responded very well to present antibiotic regimen will get 3 more days of the same upon  discharge, symptom-free on room air now.   PE  - Eliquis    HTN  - stable on present regimen.  Follow-up with PCP within a week, monitor and adjust.   History of stroke with right-sided hemiparesis, dysphagia and dysarthria.  Supportive care, no acute issues.     AKI on CKD stage IIIb.  Baseline creatinine around 1.3 to 1.5.  Improved with diuresis and now at or better than baseline, PCP to monitor.    DM2  -continue home regimen and follow-up with PCP.  Discharge diagnosis     Principal Problem:   CAP (community acquired pneumonia)    Discharge instructions    Discharge Instructions     Discharge instructions   Complete by: As directed    Follow with Primary MD Early, [name], NP and your cardiologist in 7 days   Get CBC, CMP, Magnesium , 2 view Chest X ray -  checked next visit with your primary MD   Activity: As tolerated with Full fall precautions use walker/cane & assistance as needed  Disposition Home    Diet: Heart Healthy, low carbohydrate diet.  Check CBGs q. ACHS.  Check your Weight same time everyday, if you gain over 2 pounds, or you develop in leg swelling, experience more shortness of breath or chest pain, call your Primary MD immediately. Follow Cardiac Low Salt Diet and 1.5 lit/day fluid restriction.  Special Instructions: If you have smoked or chewed Tobacco  in the last 2 yrs please stop smoking, stop any regular Alcohol  and or any Recreational drug use.  On your next visit with your primary care physician please Get Medicines reviewed and  adjusted.  Please request your Prim.MD to go over all Hospital Tests and Procedure/Radiological results at the follow up, please get all Hospital records sent to your Prim MD by signing hospital release before you go home.  If you experience worsening of your admission symptoms, develop shortness of breath, life threatening emergency, suicidal or homicidal thoughts you must seek medical attention immediately by calling 911 or calling your MD immediately  if symptoms less severe.  You Must read complete instructions/literature along with all the possible adverse reactions/side effects for all the Medicines you take and that have been prescribed to you. Take any new Medicines after you have completely understood and accpet all the possible adverse reactions/side effects.   Do not drive when taking Pain medications.  Do not take more than prescribed Pain, Sleep and Anxiety Medications  Wear Seat belts while driving.   Increase activity slowly   Complete by: As directed        Discharge Medications   Allergies as of 07/24/2024       Reactions   Latex Itching, Rash   Niacin Nausea And Vomiting        Medication List     STOP taking these medications    cefadroxil  500 MG capsule Commonly known as: DURICEF   hydrALAZINE  100 MG tablet Commonly known as: APRESOLINE    tiZANidine  2 MG tablet Commonly known as: ZANAFLEX    UNISOM  PO       TAKE these medications    amLODipine  10 MG tablet Commonly known as: NORVASC  Take 1 tablet (10 mg total) by mouth daily. What changed:  medication strength how much to take   amoxicillin -clavulanate 875-125 MG tablet Commonly known as: AUGMENTIN  Take 1 tablet by mouth every 12 (twelve) hours. What changed: when to take this   aspirin  EC 81 MG tablet Take 1 tablet (  81 mg total) by mouth daily. Swallow whole.   Breztri  Aerosphere 160-9-4.8 MCG/ACT Aero inhaler Generic drug: budesonide -glycopyrrolate -formoterol  Inhale 2 puffs into  the lungs 2 (two) times daily.   carvedilol  3.125 MG tablet Commonly known as: COREG  Take 1 tablet (3.125 mg total) by mouth 2 (two) times daily with a meal.   citalopram  10 MG tablet Commonly known as: CELEXA  TAKE 1 TABLET BY MOUTH DAILY   Dexcom G7 Sensor Misc Take 1 Device by mouth See admin instructions. Change sensor every 10 days   doxycycline  100 MG tablet Commonly known as: VIBRA -TABS Take 1 tablet (100 mg total) by mouth every 12 (twelve) hours.   Eliquis  5 MG Tabs tablet Generic drug: apixaban  TAKE 1 TABLET BY MOUTH 2 TIMES A DAY   furosemide  20 MG tablet Commonly known as: LASIX  Take 1 tablet (20 mg total) by mouth daily. What changed:  when to take this reasons to take this   lidocaine -prilocaine  cream Commonly known as: EMLA  Apply to the Port-A-Cath site 30-60 minutes before treatment   Melatonin 5 MG Chew Chew 10 mg by mouth at bedtime.   Multivitamin Gummies Womens Chew Chew 2 each by mouth in the morning.   NovoLOG  FlexPen 100 UNIT/ML FlexPen Generic drug: insulin  aspart Inject 2-10 Units into the skin 3 (three) times daily with meals.   olmesartan  20 MG tablet Commonly known as: BENICAR  Take 1 tablet (20 mg total) by mouth in the morning. What changed:  medication strength how much to take   potassium chloride  10 MEQ tablet Commonly known as: KLOR-CON  M Take 1 tablet (10 mEq total) by mouth daily.   prochlorperazine  10 MG tablet Commonly known as: COMPAZINE  Take 1 tablet (10 mg total) by mouth every 6 (six) hours as needed.   rosuvastatin  40 MG tablet Commonly known as: CRESTOR  TAKE 1 TABLET BY MOUTH DAILY   Tresiba  FlexTouch 100 UNIT/ML FlexTouch Pen Generic drug: insulin  degludec 22u AM and 10u PM. What changed:  how much to take when to take this additional instructions   UNABLE TO FIND Take 1 tablet by mouth with breakfast, with lunch, and with evening meal. GlucoGold -Berberine, Concentrated Cinnamon, Chromium, Banaba Leaf  Extract   Ventolin  HFA 108 (90 Base) MCG/ACT inhaler Generic drug: albuterol  Inhale 2 puffs into the lungs every 6 (six) hours as needed for wheezing or shortness of breath.   albuterol  (2.5 MG/3ML) 0.083% nebulizer solution Commonly known as: PROVENTIL  Inhale 3 mLs (2.5 mg total) into the lungs every 6 (six) hours as needed for wheezing or shortness of breath.         Follow-up Information     Early, [name], NP. Schedule an appointment as soon as possible for a visit in 1 week(s).   Specialty: Nurse Practitioner Why: Also follow-up with your cardiologist within a week of discharge Contact information: [address] [place] [address] [phone]                 Major procedures and Radiology Reports - PLEASE review detailed and final reports thoroughly  -     DG Chest Port 1 View Result Date: 07/22/2024 EXAM: 1 VIEW(S) XRAY OF THE CHEST 07/22/2024 07:07:00 AM COMPARISON: Portable chest dated 07/21/2024 06:20 PM. CLINICAL HISTORY: SOB (shortness of breath). FINDINGS: LINES, TUBES AND DEVICES: The right IJ port catheter terminates at the superior cavoatrial junction as before. LUNGS AND PLEURA: Left pleural effusion with dense consolidation or atelectasis in the left lower lung field continues to be seen.  The combination of findings opacifies the lower half of the left chest. Remainder of the lungs are clear. Overall aeration seems unchanged. No pneumothorax. HEART AND MEDIASTINUM: The heart is enlarged. No vascular congestion is seen. There is calcification in the transverse aorta with a stable mediastinum. BONES AND SOFT TISSUES: No acute osseous abnormality. IMPRESSION: 1. Left pleural effusion with dense consolidation or atelectasis in the left lower lung field, opacifying the lower half of the left chest, without significant change. 2. Cardiomegaly without vascular congestion. Electronically signed by: Francis Quam MD 07/22/2024 07:56 AM EST RP Workstation:  [id number]   DG Chest Port 1 View Result Date: 07/21/2024 EXAM: 1 VIEW(S) XRAY OF THE CHEST 07/21/2024 06:22:00 PM COMPARISON: 07/21/2024 CLINICAL HISTORY: resp distress FINDINGS: LINES, TUBES AND DEVICES: Right chest wall Port-A-Cath in place with tip terminating over the expected region of the right atrium. LUNGS AND PLEURA: Small to moderate left pleural effusion with hazy opacity throughout the left lower hemithorax. Pulmonary edema. No pneumothorax. HEART AND MEDIASTINUM: Unchanged cardiomegaly. Atherosclerotic plaque. BONES AND SOFT TISSUES: No acute osseous abnormality. IMPRESSION: 1. Stable small to moderate left pleural effusion with left basilar atelectasis/airspace disease. 2. Pulmonary edema. 3. Unchanged cardiomegaly. 4. Right chest wall Port-A-Cath with tip projecting over the right atrium. Electronically signed by: Greig Pique MD 07/21/2024 08:14 PM EST RP Workstation: [id number]   DG Chest Port 1 View Result Date: 07/21/2024 CLINICAL DATA:  Sepsis EXAM: PORTABLE CHEST 1 VIEW COMPARISON:  January 12, 2024 FINDINGS: Mild cardiomegaly is noted. Right internal jugular Port-A-Cath is noted with distal tip in expected position of cavoatrial junction. Mild left basilar opacity is noted concerning for possible pneumonia or atelectasis with minimal associated pleural effusion. IMPRESSION: Mild left basilar opacity is noted concerning for possible pneumonia or atelectasis with minimal associated pleural effusion. Electronically Signed   By: Lynwood Landy Raddle M.D.   On: 07/21/2024 08:47    Micro Results    Recent Results (from the past 240 hours)  Urine Culture     Status: None   Collection Time: 07/18/24  2:30 PM   Specimen: Urine, Clean Catch  Result Value Ref Range Status   Specimen Description   Final    URINE, CLEAN CATCH Performed at [facility], [address], [place], [place] [address]    Special Requests   Final    NONE Performed at [facility], [address], [place], [place] [address]    Culture   Final    NO GROWTH Performed at [facility], [address], [place], [place] [address]    Report Status 07/19/2024 FINAL  Final  Blood Culture (routine x 2)     Status: None (Preliminary result)   Collection Time: 07/21/24  7:56 AM   Specimen: BLOOD  Result Value Ref Range Status   Specimen Description BLOOD RIGHT ANTECUBITAL  Final   Special Requests   Final    BOTTLES DRAWN AEROBIC AND ANAEROBIC Blood Culture adequate volume   Culture   Final    NO GROWTH 2 DAYS Performed at [facility], [address], [place], [place] [address]    Report Status PENDING  Incomplete  Resp panel by RT-PCR (RSV, Flu A&B, Covid) Anterior Nasal Swab     Status: None   Collection Time: 07/21/24  7:56 AM   Specimen: Anterior Nasal Swab  Result Value Ref Range Status   SARS Coronavirus 2 by RT PCR NEGATIVE NEGATIVE Final  Influenza A by PCR NEGATIVE NEGATIVE Final   Influenza B by PCR NEGATIVE NEGATIVE Final    Comment: (NOTE) The Xpert Xpress SARS-CoV-2/FLU/RSV plus assay is intended as an aid in the diagnosis of influenza from Nasopharyngeal swab specimens and should not be used as a sole basis for treatment. Nasal washings and aspirates are unacceptable for Xpert Xpress SARS-CoV-2/FLU/RSV testing.  Fact Sheet for Patients: bloggercourse.com  Fact Sheet for Healthcare Providers: seriousbroker.it  This test is not yet approved or cleared by the [place]  FDA and has been authorized for detection and/or diagnosis of SARS-CoV-2 by FDA under an Emergency Use Authorization (EUA). This EUA will remain in effect (meaning this test can be used) for the duration of the COVID-19 declaration under Section 564(b)(1) of the Act, 21 U.S.C. section 360bbb-3(b)(1), unless the authorization is terminated or revoked.     Resp Syncytial Virus by PCR NEGATIVE  NEGATIVE Final    Comment: (NOTE) Fact Sheet for Patients: bloggercourse.com  Fact Sheet for Healthcare Providers: seriousbroker.it  This test is not yet approved or cleared by the [place]  FDA and has been authorized for detection and/or diagnosis of SARS-CoV-2 by FDA under an Emergency Use Authorization (EUA). This EUA will remain in effect (meaning this test can be used) for the duration of the COVID-19 declaration under Section 564(b)(1) of the Act, 21 U.S.C. section 360bbb-3(b)(1), unless the authorization is terminated or revoked.  Performed at [facility], [address], [place], [place] [address]   Respiratory (~20 pathogens) panel by PCR     Status: None   Collection Time: 07/21/24  7:56 AM   Specimen: Nasopharyngeal Swab; Respiratory  Result Value Ref Range Status   Adenovirus NOT DETECTED NOT DETECTED Final   Coronavirus 229E NOT DETECTED NOT DETECTED Final    Comment: (NOTE) The Coronavirus on the Respiratory Panel, DOES NOT test for the novel  Coronavirus (2019 nCoV)    Coronavirus HKU1 NOT DETECTED NOT DETECTED Final   Coronavirus NL63 NOT DETECTED NOT DETECTED Final   Coronavirus OC43 NOT DETECTED NOT DETECTED Final   Metapneumovirus NOT DETECTED NOT DETECTED Final   Rhinovirus / Enterovirus NOT DETECTED NOT DETECTED Final   Influenza A NOT DETECTED NOT DETECTED Final   Influenza B NOT DETECTED NOT DETECTED Final   Parainfluenza Virus 1 NOT DETECTED NOT DETECTED Final   Parainfluenza Virus 2 NOT DETECTED NOT DETECTED Final   Parainfluenza Virus 3 NOT DETECTED NOT DETECTED Final   Parainfluenza Virus 4 NOT DETECTED NOT DETECTED Final   Respiratory Syncytial Virus NOT DETECTED NOT DETECTED Final   Bordetella pertussis NOT DETECTED NOT DETECTED Final   Bordetella Parapertussis NOT DETECTED NOT DETECTED Final   Chlamydophila pneumoniae NOT DETECTED NOT DETECTED Final   Mycoplasma pneumoniae NOT  DETECTED NOT DETECTED Final    Comment: Performed at [facility], [address] [place], [place] [address]  Blood Culture (routine x 2)     Status: None (Preliminary result)   Collection Time: 07/22/24  3:47 AM   Specimen: BLOOD RIGHT HAND  Result Value Ref Range Status   Specimen Description BLOOD RIGHT HAND  Final   Special Requests   Final    BOTTLES DRAWN AEROBIC AND ANAEROBIC Blood Culture adequate volume   Culture   Final    NO GROWTH 1 DAY Performed at [facility], [address] [place], [place] [address]    Report Status PENDING  Incomplete    Today   Subjective  Kelly Gibson today has no headache,no chest abdominal pain,no new weakness tingling or numbness, feels much better wants to go home today.     Objective   Blood pressure (!) 142/64, pulse (!) 59, temperature 99 F (37.2 C), temperature source Oral, resp. rate (!) 21, height 5' 3.39 (1.61 m), weight 86.5 kg, last menstrual period 12/10/2012, SpO2 95%.   Intake/Output Summary (Last 24 hours) at 07/24/2024 0751 Last data filed at 07/24/2024 0749 Gross per 24 hour  Intake --  Output 1700 ml  Net -1700 ml    Exam  Awake Alert, No new F.N deficits, chronic right-sided hemiparesis and mild dysarthria and expressive aphasia [place].AT,PERRAL Supple Neck,   Symmetrical Chest wall movement, Good air movement bilaterally, CTAB RRR,No Gallops,   +ve B.Sounds, Abd Soft, Non tender,  No Cyanosis, Clubbing or edema    Data Review   Recent Labs  Lab 07/18/24 1028 07/18/24 1028 07/21/24 0756 07/21/24 0932 07/21/24 0934 07/22/24 0634 07/23/24 0614 07/24/24 0413  WBC 4.6  --  7.8  --   --  10.1 9.8 6.3  HGB 10.7*   < > 10.7* 9.5* 9.5* 9.0* 10.0* 10.8*  HCT 33.1*  --  35.1* 28.0* 28.0* 29.0* 32.1* 34.1*  PLT 242  --  244  --   --  212 236 245  MCV 86.9  --  93.6  --   --  91.2 89.7 89.3  MCH 28.1  --  28.5  --   --  28.3 27.9 28.3  MCHC 32.3  --  30.5  --   --  31.0 31.2 31.7  RDW  15.8*  --  16.4*  --   --  16.5* 16.6* 16.2*  LYMPHSABS 0.6*  --  0.9  --   --  0.4* 0.4* 0.4*  MONOABS 0.4  --  0.7  --   --  0.6 0.9 0.7  EOSABS 0.2  --  0.1  --   --  0.0 0.2 0.3  BASOSABS 0.0  --  0.0  --   --  0.0 0.0 0.0   < > = values in this interval not displayed.    Recent Labs  Lab 07/18/24 1028 07/21/24 0756 07/21/24 0932 07/21/24 0934 07/21/24 1151 07/21/24 1200 07/22/24 0634 07/23/24 0614 07/24/24 0413  NA 142 141 144 144  --   --  141  --  142  K 3.6 4.2 3.5 3.5  --   --  4.5  --  3.7  CL 107 107 110  --   --   --  111  --  108  CO2 24 18*  --   --   --   --  19*  --  23  ANIONGAP 11 16*  --   --   --   --  10  --  11  GLUCOSE 188* 249* 215*  --   --   --  143*  --  133*  BUN 21 21 21   --   --   --  24*  --  20  CREATININE 1.81* 1.86* 1.80*  --   --   --  1.56*  --  1.38*  AST 20 41  --   --   --   --   --   --   --   ALT 9 16  --   --   --   --   --   --   --   ALKPHOS 83 83  --   --   --   --   --   --   --  BILITOT 0.2 0.3  --   --   --   --   --   --   --   ALBUMIN 4.0 3.7  --   --   --   --   --   --   --   CRP  --   --   --   --   --   --  1.5* 1.5* 3.1*  PROCALCITON  --   --   --   --   --   --  1.69 1.17 0.73  LATICACIDVEN  --   --  4.0*  --  4.4*  --   --   --   --   INR  --  1.6*  --   --   --   --   --   --   --   TSH 1.710  --   --   --   --   --   --   --   --   HGBA1C  --   --   --   --   --  6.8*  --   --   --   MG  --  2.5*  --   --   --   --  2.7* 2.3 2.2  PHOS  --   --   --   --   --   --   --  2.0* 3.2  CALCIUM  9.4 9.0  --   --   --   --  8.7*  --  9.1    Total Time in preparing paper work, data evaluation and todays exam - 35 minutes  Signature  -    Lavada Stank M.D on 07/24/2024 at 7:51 AM   -  To page go to [email or web address]      "

## 2024-07-24 NOTE — Progress Notes (Signed)
 SLP Cancellation Note  Patient Details Name: Kelly Gibson MRN: [id number] DOB: 08-Apr-1961   Cancelled treatment:       Reason Eval/Treat Not Completed: Patient declined, no reason specified;pt being actively discharged and leaving room when SLP arrived; ST will s/o   Pat Nehal Witting,M.S.,[name] 07/24/2024, 10:32 AM

## 2024-07-24 NOTE — Plan of Care (Signed)
" °  Problem: Metabolic: Goal: Ability to maintain appropriate glucose levels will improve Outcome: Progressing   Problem: Clinical Measurements: Goal: Ability to maintain clinical measurements within normal limits will improve Outcome: Progressing Goal: Respiratory complications will improve Outcome: Progressing   Problem: Activity: Goal: Risk for activity intolerance will decrease Outcome: Progressing   Problem: Elimination: Goal: Will not experience complications related to bowel motility Outcome: Progressing   "

## 2024-07-25 ENCOUNTER — Telehealth: Payer: Self-pay

## 2024-07-25 NOTE — Care Management Important Message (Signed)
 Important Message  Patient Details  Name: Kelly Gibson MRN: [id number] Date of Birth: April 06, 1961   Important Message Given:  Yes - Medicare IM  Patient left prior to IM delivery will mail a copy to the patients home address.    Haydyn Liddell 07/25/2024, 9:13 AM

## 2024-07-25 NOTE — Transitions of Care (Post Inpatient/ED Visit) (Signed)
" ° °  07/25/2024  Name: Kelly Gibson MRN: [id number] DOB: 12-Jun-1961  Today's TOC FU Call Status: Today's TOC FU Call Status:: Successful TOC FU Call Completed TOC FU Call Complete Date: 07/25/24  Patient's Name and Date of Birth confirmed. Name, DOB  Transition Care Management Follow-up Telephone Call Date of Discharge: 07/24/24 Discharge [facility]: [name]) Type of Discharge: Inpatient Admission Primary Inpatient Discharge Diagnosis:: CAP How have you been since you were released from the hospital?: Better Any questions or concerns?: No  Items Reviewed: Did you receive and understand the discharge instructions provided?: Yes Medications obtained,verified, and reconciled?: No Medications Not Reviewed Reasons:: Other: (Patient declined most of call.)  Medications Reviewed Today:atient answered, stated she was fine, doing fine, then declined most of call. RN CM asked if any questions or concerns and patient stated No I am doing fine, thank you for calling. Ended call.  Medications Reviewed Today See above note.   Medications were not reviewed in this encounter    07/25/2024: Patient answered, stated she was fine, doing fine, then declined most of call. RN CM asked if any questions or concerns and patient stated No I am doing fine, thank you for calling. Patient ended call.     Bing Edison MSN, RN RN [employer] [facility]  [facility] Office Hours M-F [phone] Direct Dial: [phone] Main Phone [phone]  Fax: [phone] [email or web address]    "

## 2024-07-25 NOTE — Care Management Important Message (Signed)
 Important Message  Patient Details  Name: Kelly Gibson MRN: [id number] Date of Birth: 14-Sep-1960   Important Message Given:  Yes - Medicare IM  Patient left prior to IM delivery will mail a copy to the patients home address.    [name] 07/25/2024, 8:33 AM

## 2024-07-26 LAB — CULTURE, BLOOD (ROUTINE X 2)
Culture: NO GROWTH
Special Requests: ADEQUATE

## 2024-07-27 LAB — CULTURE, BLOOD (ROUTINE X 2)
Culture: NO GROWTH
Special Requests: ADEQUATE

## 2024-07-31 ENCOUNTER — Inpatient Hospital Stay

## 2024-07-31 ENCOUNTER — Inpatient Hospital Stay: Admitting: Nurse Practitioner

## 2024-08-01 ENCOUNTER — Ambulatory Visit (INDEPENDENT_AMBULATORY_CARE_PROVIDER_SITE_OTHER): Admitting: Nurse Practitioner

## 2024-08-01 ENCOUNTER — Ambulatory Visit
Admission: RE | Admit: 2024-08-01 | Discharge: 2024-08-01 | Disposition: A | Source: Ambulatory Visit | Attending: Nurse Practitioner

## 2024-08-01 ENCOUNTER — Encounter: Payer: Self-pay | Admitting: Nurse Practitioner

## 2024-08-01 VITALS — BP 108/64 | HR 77 | Wt 181.0 lb

## 2024-08-01 DIAGNOSIS — I5033 Acute on chronic diastolic (congestive) heart failure: Secondary | ICD-10-CM

## 2024-08-01 DIAGNOSIS — I495 Sick sinus syndrome: Secondary | ICD-10-CM

## 2024-08-01 DIAGNOSIS — J42 Unspecified chronic bronchitis: Secondary | ICD-10-CM

## 2024-08-01 DIAGNOSIS — Z794 Long term (current) use of insulin: Secondary | ICD-10-CM | POA: Diagnosis not present

## 2024-08-01 DIAGNOSIS — N1832 Chronic kidney disease, stage 3b: Secondary | ICD-10-CM | POA: Diagnosis not present

## 2024-08-01 DIAGNOSIS — E1122 Type 2 diabetes mellitus with diabetic chronic kidney disease: Secondary | ICD-10-CM | POA: Diagnosis not present

## 2024-08-01 DIAGNOSIS — J189 Pneumonia, unspecified organism: Secondary | ICD-10-CM

## 2024-08-01 DIAGNOSIS — I129 Hypertensive chronic kidney disease with stage 1 through stage 4 chronic kidney disease, or unspecified chronic kidney disease: Secondary | ICD-10-CM

## 2024-08-01 DIAGNOSIS — I69351 Hemiplegia and hemiparesis following cerebral infarction affecting right dominant side: Secondary | ICD-10-CM

## 2024-08-01 DIAGNOSIS — C3432 Malignant neoplasm of lower lobe, left bronchus or lung: Secondary | ICD-10-CM

## 2024-08-01 DIAGNOSIS — C799 Secondary malignant neoplasm of unspecified site: Secondary | ICD-10-CM

## 2024-08-01 DIAGNOSIS — N1831 Chronic kidney disease, stage 3a: Secondary | ICD-10-CM

## 2024-08-01 DIAGNOSIS — E1165 Type 2 diabetes mellitus with hyperglycemia: Secondary | ICD-10-CM

## 2024-08-01 DIAGNOSIS — I5043 Acute on chronic combined systolic (congestive) and diastolic (congestive) heart failure: Secondary | ICD-10-CM

## 2024-08-01 DIAGNOSIS — I10 Essential (primary) hypertension: Secondary | ICD-10-CM

## 2024-08-01 MED ORDER — FUROSEMIDE 20 MG PO TABS: 40.0000 mg | ORAL_TABLET | Freq: Every day | ORAL | Status: DC

## 2024-08-01 NOTE — Assessment & Plan Note (Signed)
 SABRA

## 2024-08-01 NOTE — Patient Instructions (Addendum)
 You can increase the furosemide  to 40mg  a day.   Keep weighing daily.   Your blood sugars should start coming down in the next few days.   I sent the order for [address] [facility] to complete the x-ray. You can go and have this done at your convenience.   Call cardiology to set up an appointment as soon as possible.

## 2024-08-01 NOTE — Progress Notes (Unsigned)
 " Kelly Doing, DNP, AGNP-c [facility] [address] [place], [place] [address] Main Office [phone]  ESTABLISHED PATIENT- Hospital Follow-Up Visit on 08/01/2024 There were no vitals filed for this visit. There is no height or weight on file to calculate BMI.  Wt Readings from Last 3 Encounters:  07/21/24 190 lb 11.2 oz (86.5 kg)  07/18/24 184 lb 14.4 oz (83.9 kg)  06/20/24 186 lb (84.4 kg)     Subjective:  No chief complaint on file.  Hospitalized 12/26-12/29 for ARF d/t CHF and possibly CAP. SIRS Needs CMP, CBC, mag, and 2 view CXR.  D/C'd on Fluid restriction 1.5L/d, 20mg  lasix  Needs to see cardiology Now on amlodipine  10mg  every day, furosemide  20mg  every day, olmesartan  20mg  every day, tresiba  22UAM and 10U PM STOPPED hydralazine , tizanidine , and unisom .   History of Present Illness Kelly Gibson is a 64 year old female with acute respiratory failure, acute on chronic CHF exacerbation, and possible community-acquired pneumonia who presents for follow-up after recent hospitalization. She is accompanied by her husband.   During her hospital stay, she was treated with Augmentin  and doxycycline  for pneumonia and experienced systemic inflammatory response syndrome (SIRS). She has completed the antibiotic therapy and denies fevers at this time. She is on a 1.5-liter daily fluid restriction and takes Lasix  20 mg daily, which she feels is not adequately managing her symptoms. She is on a low-salt diet and monitors her fluid intake closely.  She continues to have fluid around her lungs, leading to shortness of breath, especially with movement. Minimal activity, such as moving from a chair to a bed or bathroom, causes significant dyspnea. Her shortness of breath has worsened over time, although it has not significantly changed since her hospital discharge. She uses an albuterol  inhaler and nebulizer treatments, which provide some relief but do not  completely alleviate her symptoms. She expresses great concern over her limited ability to move without severe symptoms. She feels her symptoms are worse at night.   She has diabetes and is managing her blood sugar levels with Tresiba  and Novolog  (sliding scale with meals). Her blood sugar levels have been elevated and have fluctuated since her hospital stay, during which she received steroids. She uses a continuous glucose monitor and performs finger stick checks to ensure accuracy. Her blood sugars spike most significantly after lunch with readings in the 300 range for the past few days. The readings do normalize as the evening progresses and maintain a fairly stable range under the 180 mark through the night and in the morning and [place] afternoon. Her husband mentions that there have been a couple of times that she started eating before he gave her insulin  during her midday meal and feels that the significant spikes correlate with these times.   She has a history of cancer and is undergoing treatment that affects her immune system. She is scheduled for a CT scan to monitor the status of her cancer.   She has noticed a bruise on her right abdomen. She was receiving blood thinning injections during her hospital stay.   No unusual swelling in her legs and no significant weight gain has been noted since discharge. She is weighing daily and understands that a 2lb weight change overnight should be reported.  She experiences fatigue and weakness, also limiting her physical activity. She did not have a fever during her recent illness.    Past medical history, surgical history, medications, allergies, family history and social history reviewed with patient  today and changes made to appropriate areas of the chart.      Objective:    Physical Exam Vitals and nursing note reviewed.  Constitutional:      General: She is not in acute distress.    Appearance: She is obese. She is not toxic-appearing or  diaphoretic.  HENT:     Head: Normocephalic.  Eyes:     Pupils: Pupils are equal, round, and reactive to light.  Neck:     Vascular: No carotid bruit.  Cardiovascular:     Rate and Rhythm: Regular rhythm. Bradycardia present.     Pulses: Normal pulses.     Heart sounds: Normal heart sounds. No murmur heard.    No friction rub.  Pulmonary:     Breath sounds: Wheezing and rhonchi present.     Comments: Tachypnea with movement and prolonged speaking.  Abdominal:     General: Bowel sounds are normal. There is no distension.     Palpations: Abdomen is soft.     Tenderness: There is no abdominal tenderness. There is no guarding.  Musculoskeletal:     Cervical back: No tenderness.     Right lower leg: No edema.     Left lower leg: No edema.  Lymphadenopathy:     Cervical: No cervical adenopathy.  Skin:    General: Skin is warm and dry.     Capillary Refill: Capillary refill takes less than 2 seconds.     Comments: Appx 2cm purple bruise on the right abdomen, consistent with bruising related to heparin  injection.   Neurological:     Mental Status: She is alert and oriented to person, place, and time.     Sensory: Sensory deficit present.     Motor: Weakness present.     Coordination: Coordination abnormal.         Assessment & Plan:   Assessment & Plan Acute on chronic diastolic heart failure (HCC) Experiencing fluid overload with dyspnea, especially with movement. Recent hospitalization for acute respiratory failure due to exacerbation of CHF, possible community-acquired pneumonia and SIRS. Underlying COPD likely contributing to some ongoing symptoms Current treatment includes Lasix  20 mg daily, but it is not effectively managing fluid in the lungs. Her lung sounds are coarse with crackles bilaterally in the middle and bases. Wheezing present in the upper lobes. No peripheral edema or weight gain reported, which is reassuring that she is getting some fluid off. Shortness of breath  has worsened over time, but not since time of discharge. O2 saturations are stable. Fluid restriction and low-salt diet are being followed. Albuterol  treatments provide some relief. Discussed with the patient that her heart's pumping ability is compromised, leading to fluid accumulation in the lungs. This is a chronic problem, but recent exacerbation by possible illness has made this worse. We discussed the option for home oxygen at night when dyspnea is at its worst, but she declines this at this time. Will plan to increase the lasix  to see if we can get additional fluid removed from the lungs. Reinforced strict sodium and liquid intake. Encouraged her to continue moving, but not to push herself to the point of severe shortness of breath or exhaustion. We discussed that the dyspnea should improve some, but may continue to be a symptom given the underlying heart failure.  - Increased Lasix  to 40 mg daily, split into two doses (AM and afternoon) to help with evening dyspnea. Schedule evening albuterol  nebulizer treatments.  - Ordered chest x-ray to assess fluid status. -  Checked labs to monitor kidney function and electrolytes. - Will consider switching to torsemide if furosemide  is not effective or if kidney function is reducing. - Encouraged low-salt diet and fluid restriction. - Encouraged gradual increase in physical activity as tolerated. - Use the nebulizer every evening before getting ready for bed to help - Recommend cardiology evaluation for ongoing monitoring and management of heart failure.  - Return to ED precautions reviewed.  Orders:   CBC with Differential/Platelet   Comprehensive metabolic panel with GFR   Magnesium    DG Chest 2 View   furosemide  (LASIX ) 20 MG tablet; Take 2 tablets (40 mg total) by mouth daily.  Hypertension associated with stage 3b chronic kidney disease due to type 2 diabetes mellitus (HCC) Blood pressures are well controlled on the current regimen. No hypotension  noted. Will plan to continue current regimen and monitor closely.  Orders:   CBC with Differential/Platelet   Comprehensive metabolic panel with GFR   Magnesium    DG Chest 2 View   furosemide  (LASIX ) 20 MG tablet; Take 2 tablets (40 mg total) by mouth daily.  Uncontrolled type 2 diabetes mellitus with hyperglycemia, with long-term current use of insulin  (HCC) Blood sugar levels have been fluctuating, with spikes likely due to recent steroid use during hospitalization. Current insulin  regimen includes Tresiba  22u AM and 10u PM and Novolog  sliding scale with meals. Blood sugar spikes noted in the afternoon/evening, possibly timing of insulin  administration relative to meals contrubting. Fingerstick calibration is being done regularly to ensure accuracy of continuous glucose monitor readings. We discussed that the steroids will continue to to cause elevation for a few more days, but she should begin to see her BG normalize with time. We discussed possibly changing Novolog  scale dosing, but given the expectation that steroid effects will begin to wear off soon, I do not want to trigger rebound hypoglycemia, which could be more dangerous. I will wait and follow-up with her next week virtually and we can see how her blood sugars are trending and make a decision based on that. She may need an adjustment to a more aggressive scale if BG remains elevated.  - Continue current insulin  regimen with Tresiba  and Novolog . - Monitor blood sugar levels closely, especially in the evening. - Ensure insulin  is administered before meals to prevent postprandial spikes. - Continue regular calibration of continuous glucose monitor with fingerstick readings.    Sick sinus syndrome (HCC) Lower cardiac output and persistent bradycardia likely a contributor to heart failure symptoms. Strongly recommend cardiology evaluation to help best manage this and ensure that dosing of carvedilol  is appropriate given the current situation  and symptoms.     Malignant neoplasm of lower lobe of left lung (HCC) Secondary malignant neoplasm of unspecified site (CODE) (HCC) Undergoing treatment for lung cancer with lymph node involvement, with a recent change in therapy to enhance immune response. Awaiting upcoming CT scan to assess treatment efficacy. No significant findings mentioned on recent chest x-ray related to lung cancer status. - Await results of upcoming CT scan to evaluate cancer status. - Continue current cancer treatment regimen.     Chronic bronchitis, unspecified chronic bronchitis type (HCC) Underlying COPD likely a contributor to shortness of breath present. Recommend continuation of daily Breztri  inhaler use and consider scheduled albuterol  nebulizer treatments, particularly before getting ready for activity.     CKD stage 3a, GFR 45-59 ml/min (HCC) - Baseline Scr 1.2-1.5 CKD with most recent creatinine of 1.38 and GFR of 43. This is improved  from the start of hospitalization. Will cautiously increase furosemide  dosing to 20mg  BID to see if we can get improved fluid off of the lungs, but will consider transition to torsemide if long term treatment is required- patient has a large volume of furosemide  at home. Will reheck the kidney function today. We discussed the importance of ongoing potassium supplementation while on furosemide  and need for monitoring. She has had difficulty swallowing the large pills. Recommend dissolving (not crushing) in small amount of liquid and giving in a spoonful of applesauce or yogurt to help with swallowing.     Community acquired pneumonia of left lower lobe of lung Initially diagnosed in the hospital, but later reports show this was less likely the cause. Completed antibiotic course and steroids. No fevers present at this time.     Hemiparesis affecting right side as late effect of cerebrovascular accident (CVA) (HCC) Right sided hemiparesis increasing difficulty with activity. We  discussed PT, but she declines this at this time. I recommend continue to move around and stay as active as she can without triggering severe shortness of breath.         SaraBeth Anberlin Diez, DNP, AGNP-c  68 minutes spent on care provided today. Time includes care provided during the visit (>50% total time), care coordination, chart review, and documentation.  "

## 2024-08-02 ENCOUNTER — Encounter: Payer: Self-pay | Admitting: Nurse Practitioner

## 2024-08-02 ENCOUNTER — Encounter: Payer: Self-pay | Admitting: Internal Medicine

## 2024-08-02 ENCOUNTER — Ambulatory Visit: Payer: Self-pay | Admitting: Nurse Practitioner

## 2024-08-02 LAB — COMPREHENSIVE METABOLIC PANEL WITH GFR
ALT: 14 IU/L (ref 0–32)
AST: 21 IU/L (ref 0–40)
Albumin: 3.7 g/dL — AB (ref 3.9–4.9)
Alkaline Phosphatase: 80 IU/L (ref 49–135)
BUN/Creatinine Ratio: 23 (ref 12–28)
BUN: 33 mg/dL — AB (ref 8–27)
Bilirubin Total: 0.2 mg/dL (ref 0.0–1.2)
CO2: 21 mmol/L (ref 20–29)
Calcium: 9.2 mg/dL (ref 8.7–10.3)
Chloride: 105 mmol/L (ref 96–106)
Creatinine, Ser: 1.44 mg/dL — AB (ref 0.57–1.00)
Globulin, Total: 2.8 g/dL (ref 1.5–4.5)
Glucose: 137 mg/dL — AB (ref 70–99)
Potassium: 4.5 mmol/L (ref 3.5–5.2)
Sodium: 142 mmol/L (ref 134–144)
Total Protein: 6.5 g/dL (ref 6.0–8.5)
eGFR: 41 mL/min/1.73 — AB

## 2024-08-02 LAB — CBC WITH DIFFERENTIAL/PLATELET
Basophils Absolute: 0 x10E3/uL (ref 0.0–0.2)
Basos: 1 %
EOS (ABSOLUTE): 0.2 x10E3/uL (ref 0.0–0.4)
Eos: 4 %
Hematocrit: 34.9 % (ref 34.0–46.6)
Hemoglobin: 10.7 g/dL — ABNORMAL LOW (ref 11.1–15.9)
Immature Grans (Abs): 0 x10E3/uL (ref 0.0–0.1)
Immature Granulocytes: 0 %
Lymphocytes Absolute: 0.6 x10E3/uL — ABNORMAL LOW (ref 0.7–3.1)
Lymphs: 12 %
MCH: 27.3 pg (ref 26.6–33.0)
MCHC: 30.7 g/dL — ABNORMAL LOW (ref 31.5–35.7)
MCV: 89 fL (ref 79–97)
Monocytes Absolute: 0.5 x10E3/uL (ref 0.1–0.9)
Monocytes: 10 %
Neutrophils Absolute: 3.7 x10E3/uL (ref 1.4–7.0)
Neutrophils: 73 %
Platelets: 277 x10E3/uL (ref 150–450)
RBC: 3.92 x10E6/uL (ref 3.77–5.28)
RDW: 16.1 % — ABNORMAL HIGH (ref 11.7–15.4)
WBC: 5 x10E3/uL (ref 3.4–10.8)

## 2024-08-02 NOTE — Assessment & Plan Note (Signed)
 Blood sugar levels have been fluctuating, with spikes likely due to recent steroid use during hospitalization. Current insulin  regimen includes Tresiba  22u AM and 10u PM and Novolog  sliding scale with meals. Blood sugar spikes noted in the afternoon/evening, possibly timing of insulin  administration relative to meals contrubting. Fingerstick calibration is being done regularly to ensure accuracy of continuous glucose monitor readings. We discussed that the steroids will continue to to cause elevation for a few more days, but she should begin to see her BG normalize with time. We discussed possibly changing Novolog  scale dosing, but given the expectation that steroid effects will begin to wear off soon, I do not want to trigger rebound hypoglycemia, which could be more dangerous. I will wait and follow-up with her next week virtually and we can see how her blood sugars are trending and make a decision based on that. She may need an adjustment to a more aggressive scale if BG remains elevated.  - Continue current insulin  regimen with Tresiba  and Novolog . - Monitor blood sugar levels closely, especially in the evening. - Ensure insulin  is administered before meals to prevent postprandial spikes. - Continue regular calibration of continuous glucose monitor with fingerstick readings.

## 2024-08-02 NOTE — Assessment & Plan Note (Signed)
 Right sided hemiparesis increasing difficulty with activity. We discussed PT, but she declines this at this time. I recommend continue to move around and stay as active as she can without triggering severe shortness of breath.

## 2024-08-02 NOTE — Assessment & Plan Note (Signed)
 CKD with most recent creatinine of 1.38 and GFR of 43. This is improved from the start of hospitalization. Will cautiously increase furosemide  dosing to 20mg  BID to see if we can get improved fluid off of the lungs, but will consider transition to torsemide if long term treatment is required- patient has a large volume of furosemide  at home. Will reheck the kidney function today. We discussed the importance of ongoing potassium supplementation while on furosemide  and need for monitoring. She has had difficulty swallowing the large pills. Recommend dissolving (not crushing) in small amount of liquid and giving in a spoonful of applesauce or yogurt to help with swallowing.

## 2024-08-02 NOTE — Assessment & Plan Note (Signed)
 Initially diagnosed in the hospital, but later reports show this was less likely the cause. Completed antibiotic course and steroids. No fevers present at this time.

## 2024-08-02 NOTE — Assessment & Plan Note (Signed)
 Undergoing treatment for lung cancer with lymph node involvement, with a recent change in therapy to enhance immune response. Awaiting upcoming CT scan to assess treatment efficacy. No significant findings mentioned on recent chest x-ray related to lung cancer status. - Await results of upcoming CT scan to evaluate cancer status. - Continue current cancer treatment regimen.

## 2024-08-02 NOTE — Assessment & Plan Note (Signed)
 Underlying COPD likely a contributor to shortness of breath present. Recommend continuation of daily Breztri  inhaler use and consider scheduled albuterol  nebulizer treatments, particularly before getting ready for activity.

## 2024-08-02 NOTE — Assessment & Plan Note (Addendum)
 Experiencing fluid overload with dyspnea, especially with movement. Recent hospitalization for acute respiratory failure due to exacerbation of CHF, possible community-acquired pneumonia and SIRS. Underlying COPD likely contributing to some ongoing symptoms Current treatment includes Lasix  20 mg daily, but it is not effectively managing fluid in the lungs. Her lung sounds are coarse with crackles bilaterally in the middle and bases. Wheezing present in the upper lobes. No peripheral edema or weight gain reported, which is reassuring that she is getting some fluid off. Shortness of breath has worsened over time, but not since time of discharge. O2 saturations are stable. Fluid restriction and low-salt diet are being followed. Albuterol  treatments provide some relief. Discussed with the patient that her heart's pumping ability is compromised, leading to fluid accumulation in the lungs. This is a chronic problem, but recent exacerbation by possible illness has made this worse. We discussed the option for home oxygen at night when dyspnea is at its worst, but she declines this at this time. Will plan to increase the lasix  to see if we can get additional fluid removed from the lungs. Reinforced strict sodium and liquid intake. Encouraged her to continue moving, but not to push herself to the point of severe shortness of breath or exhaustion. We discussed that the dyspnea should improve some, but may continue to be a symptom given the underlying heart failure.  - Increased Lasix  to 40 mg daily, split into two doses (AM and afternoon) to help with evening dyspnea. Schedule evening albuterol  nebulizer treatments.  - Ordered chest x-ray to assess fluid status. - Checked labs to monitor kidney function and electrolytes. - Will consider switching to torsemide if furosemide  is not effective or if kidney function is reducing. - Encouraged low-salt diet and fluid restriction. - Encouraged gradual increase in physical  activity as tolerated. - Use the nebulizer every evening before getting ready for bed to help - Recommend cardiology evaluation for ongoing monitoring and management of heart failure.  - Return to ED precautions reviewed.  Orders:   CBC with Differential/Platelet   Comprehensive metabolic panel with GFR   Magnesium    DG Chest 2 View   furosemide  (LASIX ) 20 MG tablet; Take 2 tablets (40 mg total) by mouth daily.

## 2024-08-02 NOTE — Assessment & Plan Note (Signed)
 Lower cardiac output and persistent bradycardia likely a contributor to heart failure symptoms. Strongly recommend cardiology evaluation to help best manage this and ensure that dosing of carvedilol  is appropriate given the current situation and symptoms.

## 2024-08-03 ENCOUNTER — Telehealth: Payer: Self-pay | Admitting: Nurse Practitioner

## 2024-08-03 ENCOUNTER — Telehealth: Payer: Self-pay

## 2024-08-03 NOTE — Telephone Encounter (Signed)
 PAP [place] [employer] [place] BREZTRI  2026 approved

## 2024-08-03 NOTE — Progress Notes (Signed)
" ° °  08/03/2024  Patient ID: Kelly Gibson, female   DOB: 06/22/1961, 64 y.o.   MRN: [id number]  Contacted [employer] to follow up on patient's application for Tresiba /Novolog  and Pen Needles PAP. Has been approved through 07/26/25.  Breztri  also [place] through [place] through 07/26/25.  Jon VEAR Lindau, PharmD Clinical Pharmacist [phone]  "

## 2024-08-07 ENCOUNTER — Encounter: Payer: Self-pay | Admitting: Nurse Practitioner

## 2024-08-08 ENCOUNTER — Encounter (HOSPITAL_COMMUNITY): Payer: Self-pay

## 2024-08-08 ENCOUNTER — Inpatient Hospital Stay (HOSPITAL_COMMUNITY)
Admission: RE | Admit: 2024-08-08 | Discharge: 2024-08-08 | Disposition: A | Discharge status: Home / Self Care | Source: Ambulatory Visit | Attending: Physician Assistant | Admitting: Physician Assistant

## 2024-08-08 DIAGNOSIS — C3432 Malignant neoplasm of lower lobe, left bronchus or lung: Secondary | ICD-10-CM | POA: Insufficient documentation

## 2024-08-08 LAB — FECAL OCCULT BLOOD, IMMUNOCHEMICAL: IFOBT: NEGATIVE

## 2024-08-08 NOTE — Telephone Encounter (Signed)
 PAP needles received, called pt and informed

## 2024-08-09 ENCOUNTER — Inpatient Hospital Stay (HOSPITAL_COMMUNITY)
Admission: EM | Admit: 2024-08-09 | Discharge: 2024-08-15 | DRG: 291 | Disposition: A | Discharge status: Home / Self Care

## 2024-08-09 ENCOUNTER — Other Ambulatory Visit: Payer: Self-pay

## 2024-08-09 ENCOUNTER — Encounter: Payer: Self-pay | Admitting: Nurse Practitioner

## 2024-08-09 ENCOUNTER — Emergency Department (HOSPITAL_COMMUNITY)

## 2024-08-09 ENCOUNTER — Encounter (HOSPITAL_COMMUNITY): Payer: Self-pay

## 2024-08-09 DIAGNOSIS — Z7951 Long term (current) use of inhaled steroids: Secondary | ICD-10-CM

## 2024-08-09 DIAGNOSIS — I130 Hypertensive heart and chronic kidney disease with heart failure and stage 1 through stage 4 chronic kidney disease, or unspecified chronic kidney disease: Principal | ICD-10-CM | POA: Diagnosis present

## 2024-08-09 DIAGNOSIS — I252 Old myocardial infarction: Secondary | ICD-10-CM

## 2024-08-09 DIAGNOSIS — I2699 Other pulmonary embolism without acute cor pulmonale: Secondary | ICD-10-CM

## 2024-08-09 DIAGNOSIS — I2510 Atherosclerotic heart disease of native coronary artery without angina pectoris: Secondary | ICD-10-CM | POA: Diagnosis present

## 2024-08-09 DIAGNOSIS — N1832 Chronic kidney disease, stage 3b: Secondary | ICD-10-CM | POA: Diagnosis present

## 2024-08-09 DIAGNOSIS — I495 Sick sinus syndrome: Secondary | ICD-10-CM | POA: Diagnosis present

## 2024-08-09 DIAGNOSIS — Z91040 Latex allergy status: Secondary | ICD-10-CM

## 2024-08-09 DIAGNOSIS — F418 Other specified anxiety disorders: Secondary | ICD-10-CM

## 2024-08-09 DIAGNOSIS — Z7901 Long term (current) use of anticoagulants: Secondary | ICD-10-CM

## 2024-08-09 DIAGNOSIS — Z9221 Personal history of antineoplastic chemotherapy: Secondary | ICD-10-CM

## 2024-08-09 DIAGNOSIS — E1122 Type 2 diabetes mellitus with diabetic chronic kidney disease: Secondary | ICD-10-CM | POA: Diagnosis present

## 2024-08-09 DIAGNOSIS — J441 Chronic obstructive pulmonary disease with (acute) exacerbation: Principal | ICD-10-CM | POA: Diagnosis present

## 2024-08-09 DIAGNOSIS — Z87891 Personal history of nicotine dependence: Secondary | ICD-10-CM

## 2024-08-09 DIAGNOSIS — Z86711 Personal history of pulmonary embolism: Secondary | ICD-10-CM

## 2024-08-09 DIAGNOSIS — E785 Hyperlipidemia, unspecified: Secondary | ICD-10-CM | POA: Diagnosis present

## 2024-08-09 DIAGNOSIS — Z888 Allergy status to other drugs, medicaments and biological substances status: Secondary | ICD-10-CM

## 2024-08-09 DIAGNOSIS — Z8249 Family history of ischemic heart disease and other diseases of the circulatory system: Secondary | ICD-10-CM

## 2024-08-09 DIAGNOSIS — J9621 Acute and chronic respiratory failure with hypoxia: Secondary | ICD-10-CM | POA: Diagnosis present

## 2024-08-09 DIAGNOSIS — N179 Acute kidney failure, unspecified: Secondary | ICD-10-CM | POA: Diagnosis present

## 2024-08-09 DIAGNOSIS — Z85118 Personal history of other malignant neoplasm of bronchus and lung: Secondary | ICD-10-CM

## 2024-08-09 DIAGNOSIS — R079 Chest pain, unspecified: Secondary | ICD-10-CM

## 2024-08-09 DIAGNOSIS — I5033 Acute on chronic diastolic (congestive) heart failure: Secondary | ICD-10-CM | POA: Diagnosis not present

## 2024-08-09 DIAGNOSIS — Z1152 Encounter for screening for COVID-19: Secondary | ICD-10-CM

## 2024-08-09 DIAGNOSIS — I2489 Other forms of acute ischemic heart disease: Secondary | ICD-10-CM | POA: Diagnosis present

## 2024-08-09 DIAGNOSIS — Z79899 Other long term (current) drug therapy: Secondary | ICD-10-CM

## 2024-08-09 DIAGNOSIS — J918 Pleural effusion in other conditions classified elsewhere: Secondary | ICD-10-CM | POA: Diagnosis present

## 2024-08-09 DIAGNOSIS — I69351 Hemiplegia and hemiparesis following cerebral infarction affecting right dominant side: Secondary | ICD-10-CM

## 2024-08-09 DIAGNOSIS — Z823 Family history of stroke: Secondary | ICD-10-CM

## 2024-08-09 DIAGNOSIS — J90 Pleural effusion, not elsewhere classified: Secondary | ICD-10-CM

## 2024-08-09 DIAGNOSIS — Z7982 Long term (current) use of aspirin: Secondary | ICD-10-CM

## 2024-08-09 DIAGNOSIS — Z87442 Personal history of urinary calculi: Secondary | ICD-10-CM

## 2024-08-09 DIAGNOSIS — Z833 Family history of diabetes mellitus: Secondary | ICD-10-CM

## 2024-08-09 DIAGNOSIS — E1169 Type 2 diabetes mellitus with other specified complication: Secondary | ICD-10-CM

## 2024-08-09 DIAGNOSIS — I69322 Dysarthria following cerebral infarction: Secondary | ICD-10-CM

## 2024-08-09 DIAGNOSIS — F32A Depression, unspecified: Secondary | ICD-10-CM | POA: Diagnosis present

## 2024-08-09 DIAGNOSIS — F39 Unspecified mood [affective] disorder: Secondary | ICD-10-CM | POA: Diagnosis present

## 2024-08-09 DIAGNOSIS — D631 Anemia in chronic kidney disease: Secondary | ICD-10-CM | POA: Diagnosis present

## 2024-08-09 DIAGNOSIS — I4430 Unspecified atrioventricular block: Secondary | ICD-10-CM | POA: Diagnosis present

## 2024-08-09 DIAGNOSIS — R230 Cyanosis: Secondary | ICD-10-CM | POA: Diagnosis present

## 2024-08-09 DIAGNOSIS — J9811 Atelectasis: Secondary | ICD-10-CM | POA: Diagnosis present

## 2024-08-09 DIAGNOSIS — Z794 Long term (current) use of insulin: Secondary | ICD-10-CM

## 2024-08-09 LAB — CBC WITH DIFFERENTIAL/PLATELET
Abs Immature Granulocytes: 0.02 K/uL (ref 0.00–0.07)
Basophils Absolute: 0 K/uL (ref 0.0–0.1)
Basophils Relative: 0 %
Eosinophils Absolute: 0.3 K/uL (ref 0.0–0.5)
Eosinophils Relative: 3 %
HCT: 33.5 % — ABNORMAL LOW (ref 36.0–46.0)
Hemoglobin: 10.4 g/dL — ABNORMAL LOW (ref 12.0–15.0)
Immature Granulocytes: 0 %
Lymphocytes Relative: 6 %
Lymphs Abs: 0.5 K/uL — ABNORMAL LOW (ref 0.7–4.0)
MCH: 27.9 pg (ref 26.0–34.0)
MCHC: 31 g/dL (ref 30.0–36.0)
MCV: 89.8 fL (ref 80.0–100.0)
Monocytes Absolute: 0.8 K/uL (ref 0.1–1.0)
Monocytes Relative: 9 %
Neutro Abs: 6.8 K/uL (ref 1.7–7.7)
Neutrophils Relative %: 82 %
Platelets: 246 K/uL (ref 150–400)
RBC: 3.73 MIL/uL — ABNORMAL LOW (ref 3.87–5.11)
RDW: 16.5 % — ABNORMAL HIGH (ref 11.5–15.5)
WBC: 8.4 K/uL (ref 4.0–10.5)
nRBC: 0 % (ref 0.0–0.2)

## 2024-08-09 LAB — COMPREHENSIVE METABOLIC PANEL WITH GFR
ALT: 11 U/L (ref 0–44)
AST: 22 U/L (ref 15–41)
Albumin: 3.5 g/dL (ref 3.5–5.0)
Alkaline Phosphatase: 87 U/L (ref 38–126)
Anion gap: 14 (ref 5–15)
BUN: 23 mg/dL (ref 8–23)
CO2: 23 mmol/L (ref 22–32)
Calcium: 9 mg/dL (ref 8.9–10.3)
Chloride: 101 mmol/L (ref 98–111)
Creatinine, Ser: 1.64 mg/dL — ABNORMAL HIGH (ref 0.44–1.00)
GFR, Estimated: 35 mL/min — ABNORMAL LOW
Glucose, Bld: 202 mg/dL — ABNORMAL HIGH (ref 70–99)
Potassium: 4.7 mmol/L (ref 3.5–5.1)
Sodium: 138 mmol/L (ref 135–145)
Total Bilirubin: 0.3 mg/dL (ref 0.0–1.2)
Total Protein: 6.9 g/dL (ref 6.5–8.1)

## 2024-08-09 LAB — PRO BRAIN NATRIURETIC PEPTIDE: Pro Brain Natriuretic Peptide: 2561 pg/mL — ABNORMAL HIGH

## 2024-08-09 LAB — I-STAT VENOUS BLOOD GAS, ED
Acid-Base Excess: 4 mmol/L — ABNORMAL HIGH (ref 0.0–2.0)
Bicarbonate: 28.4 mmol/L — ABNORMAL HIGH (ref 20.0–28.0)
Calcium, Ion: 1.12 mmol/L — ABNORMAL LOW (ref 1.15–1.40)
HCT: 33 % — ABNORMAL LOW (ref 36.0–46.0)
Hemoglobin: 11.2 g/dL — ABNORMAL LOW (ref 12.0–15.0)
O2 Saturation: 100 %
Potassium: 4.8 mmol/L (ref 3.5–5.1)
Sodium: 139 mmol/L (ref 135–145)
TCO2: 30 mmol/L (ref 22–32)
pCO2, Ven: 42.8 mmHg — ABNORMAL LOW (ref 44–60)
pH, Ven: 7.43 (ref 7.25–7.43)
pO2, Ven: 183 mmHg — ABNORMAL HIGH (ref 32–45)

## 2024-08-09 LAB — RESP PANEL BY RT-PCR (RSV, FLU A&B, COVID)  RVPGX2
Influenza A by PCR: NEGATIVE
Influenza B by PCR: NEGATIVE
Resp Syncytial Virus by PCR: NEGATIVE
SARS Coronavirus 2 by RT PCR: NEGATIVE

## 2024-08-09 LAB — TROPONIN T, HIGH SENSITIVITY: Troponin T High Sensitivity: 31 ng/L — ABNORMAL HIGH (ref 0–19)

## 2024-08-09 MED ORDER — METHYLPREDNISOLONE SODIUM SUCC 125 MG IJ SOLR
125.0000 mg | Freq: Once | INTRAMUSCULAR | Status: AC
  Administered 2024-08-09: 125 mg via INTRAVENOUS
  Filled 2024-08-09: qty 2

## 2024-08-09 MED ORDER — IPRATROPIUM-ALBUTEROL 0.5-2.5 (3) MG/3ML IN SOLN
3.0000 mL | Freq: Once | RESPIRATORY_TRACT | Status: AC
  Administered 2024-08-09: 3 mL via RESPIRATORY_TRACT
  Filled 2024-08-09: qty 3

## 2024-08-09 MED ORDER — VANCOMYCIN HCL 1500 MG/300ML IV SOLN
1500.0000 mg | Freq: Once | INTRAVENOUS | Status: AC
  Administered 2024-08-10: 1500 mg via INTRAVENOUS
  Filled 2024-08-09: qty 300

## 2024-08-09 MED ORDER — SODIUM CHLORIDE 0.9 % IV SOLN
2.0000 g | Freq: Once | INTRAVENOUS | Status: AC
  Administered 2024-08-10: 2 g via INTRAVENOUS
  Filled 2024-08-09: qty 12.5

## 2024-08-09 MED ORDER — MAGNESIUM SULFATE 2 GM/50ML IV SOLN
2.0000 g | Freq: Once | INTRAVENOUS | Status: AC
  Administered 2024-08-09: 2 g via INTRAVENOUS
  Filled 2024-08-09: qty 50

## 2024-08-09 NOTE — ED Provider Notes (Signed)
 Shared PA visit.  Patient arrives with shortness of breath.  History of CHF lung cancer not undergoing active treatment, COPD.  Found to be hypoxic with EMS.  She feels like she has fluid in her lungs but also has wheezing.  She got breathing treatments with EMS.  Overall she does have CKD history as well.  She does have chronic asymmetric swelling to the right lower leg she states.  She is on Eliquis  for stroke history as well with right-sided weakness at baseline.  Differential diagnosis includes volume overload versus COPD exacerbation versus both.  Seems less likely to be infectious process.  She has no fever.  She denies any chills or cough.  Will look for viral process pneumonia or volume overload.  Seems less likely to be ACS.  She is anticoagulated already and seems less likely to be PE.  EKG with some artifact but shows sinus rhythm.  Will get a repeat.  Anticipate admission given new respiratory failure.  She is feeling much better on BiPAP.  See PA note for further results evaluation and disposition  This chart was dictated using voice recognition software.  Despite best efforts to proofread,  errors can occur which can change the documentation meaning.    Ruthe Cornet, DO 08/09/24 2248

## 2024-08-09 NOTE — ED Provider Notes (Signed)
 " [place] EMERGENCY DEPARTMENT AT [facility] Provider Note   CSN: [id number] Arrival date & time: 08/09/24  2231     Patient presents with: Respiratory Distress   Kelly Gibson is a 64 y.o. female.  Patient with past medical history significant for COPD, right-sided hemiparesis secondary to CVA, stage IIIa CKD, malignant neoplasm of lower left lobe of lung, currently on Eliquis  presents to the emergency department via EMS for shortness of breath.  Patient reportedly felt well during the day but before going to bed had a sudden onset of shortness of breath which lasted approximate 20 minutes prior to EMS arrival.  EMS reports that patient had SpO2 of 60% on room air upon their arrival with increased respiratory rate.  They reported wheezing and diminished lung sounds in all lung fields.  CPAP was applied and 2 DuoNebs were administered with the second 1 being discontinued at the time of arrival at the emergency department for transition to BiPAP.  Patient states she also has history of fluid overload in addition to her COPD.  She reports compliance with her medication including her furosemide  and Eliquis .   HPI     Prior to Admission medications  Medication Sig Start Date End Date Taking? Authorizing Provider  albuterol  (PROVENTIL ) (2.5 MG/3ML) 0.083% nebulizer solution Inhale 3 mLs (2.5 mg total) into the lungs every 6 (six) hours as needed for wheezing or shortness of breath. 01/04/24  Yes Darci Pore, MD  albuterol  (VENTOLIN  HFA) 108 (90 Base) MCG/ACT inhaler Inhale 2 puffs into the lungs every 6 (six) hours as needed for wheezing or shortness of breath. 12/14/23  Yes Perri DELENA Meliton Mickey., MD  amLODipine  (NORVASC ) 10 MG tablet Take 1 tablet (10 mg total) by mouth daily. 07/24/24  Yes Dennise Lavada POUR, MD  aspirin  EC 81 MG tablet Take 1 tablet (81 mg total) by mouth daily. Swallow whole. 12/15/23  Yes Perri DELENA Meliton Mickey., MD  budesonide -glycopyrrolate -formoterol   (BREZTRI  AEROSPHERE) 160-9-4.8 MCG/ACT AERO inhaler Inhale 2 puffs into the lungs 2 (two) times daily. 12/14/23  Yes Perri DELENA Meliton Mickey., MD  carvedilol  (COREG ) 3.125 MG tablet Take 1 tablet (3.125 mg total) by mouth 2 (two) times daily with a meal. 07/24/24  Yes Dennise Lavada POUR, MD  citalopram  (CELEXA ) 10 MG tablet TAKE 1 TABLET BY MOUTH DAILY 07/06/24  Yes Early, Sara E, NP  Continuous Glucose Sensor (DEXCOM G7 SENSOR) MISC Take 1 Device by mouth See admin instructions. Change sensor every 10 days   Yes [provider]  ELIQUIS  5 MG TABS tablet TAKE 1 TABLET BY MOUTH 2 TIMES A DAY 06/20/24  Yes Sherrod Sherrod, MD  furosemide  (LASIX ) 20 MG tablet Take 2 tablets (40 mg total) by mouth daily. 08/01/24 10/30/24 Yes Early, Sara E, NP  insulin  aspart (NOVOLOG  FLEXPEN) 100 UNIT/ML FlexPen Inject 2-10 Units into the skin 3 (three) times daily with meals.   Yes [provider]  insulin  degludec (TRESIBA  FLEXTOUCH) 100 UNIT/ML FlexTouch Pen 22u AM and 10u PM. 05/15/24  Yes Early, Sara E, NP  lidocaine -prilocaine  (EMLA ) cream Apply to the Port-A-Cath site 30-60 minutes before treatment 03/21/24  Yes Sherrod Sherrod, MD  Melatonin 5 MG CHEW Chew 10 mg by mouth at bedtime.   Yes [provider]  Multiple Vitamins-Minerals (MULTIVITAMIN GUMMIES WOMENS) CHEW Chew 2 each by mouth in the morning.   Yes [provider]  olmesartan  (BENICAR ) 20 MG tablet Take 1 tablet (20 mg total) by mouth in the morning.  07/24/24  Yes Singh, Prashant K, MD  potassium chloride  (KLOR-CON  M) 10 MEQ tablet Take 1 tablet (10 mEq total) by mouth daily. 07/24/24  Yes Singh, Prashant K, MD  rosuvastatin  (CRESTOR ) 40 MG tablet TAKE 1 TABLET BY MOUTH DAILY 05/15/24  Yes Early, Sara E, NP  UNABLE TO FIND Take 1 tablet by mouth with breakfast, with lunch, and with evening meal. GlucoGold -Berberine, Concentrated Cinnamon, Chromium, Banaba Leaf Extract   Yes [provider]  prochlorperazine   (COMPAZINE ) 10 MG tablet Take 1 tablet (10 mg total) by mouth every 6 (six) hours as needed. Patient not taking: Reported on 08/10/2024 02/15/24   Heilingoetter, Cassandra L, PA-C    Allergies: Latex and Niacin    Review of Systems  Updated Vital Signs BP 104/60   Pulse (!) 39   Temp 97.7 F (36.5 C) (Temporal)   Resp (!) 28   Ht 5' 3 (1.6 m)   Wt 81.6 kg   LMP 12/10/2012   SpO2 100%   BMI 31.89 kg/m   Physical Exam Vitals and nursing note reviewed.  Constitutional:      General: She is not in acute distress.    Appearance: She is well-developed.  HENT:     Head: Normocephalic and atraumatic.  Eyes:     Conjunctiva/sclera: Conjunctivae normal.  Cardiovascular:     Rate and Rhythm: Normal rate and regular rhythm.     Heart sounds: No murmur heard. Pulmonary:     Effort: Respiratory distress present.     Breath sounds: Decreased breath sounds and wheezing present.  Abdominal:     Palpations: Abdomen is soft.     Tenderness: There is no abdominal tenderness.  Musculoskeletal:        General: No swelling.     Cervical back: Neck supple.     Right lower leg: Edema present.     Left lower leg: No edema.  Skin:    General: Skin is warm and dry.     Capillary Refill: Capillary refill takes less than 2 seconds.  Neurological:     Mental Status: She is alert. Mental status is at baseline.  Psychiatric:        Mood and Affect: Mood normal.     (all labs ordered are listed, but only abnormal results are displayed) Labs Reviewed  CBC WITH DIFFERENTIAL/PLATELET - Abnormal; Notable for the following components:      Result Value   RBC 3.73 (*)    Hemoglobin 10.4 (*)    HCT 33.5 (*)    RDW 16.5 (*)    Lymphs Abs 0.5 (*)    All other components within normal limits  COMPREHENSIVE METABOLIC PANEL WITH GFR - Abnormal; Notable for the following components:   Glucose, Bld 202 (*)    Creatinine, Ser 1.64 (*)    GFR, Estimated 35 (*)    All other components within normal  limits  PRO BRAIN NATRIURETIC PEPTIDE - Abnormal; Notable for the following components:   Pro Brain Natriuretic Peptide 2,561.0 (*)    All other components within normal limits  I-STAT VENOUS BLOOD GAS, ED - Abnormal; Notable for the following components:   pCO2, Ven 42.8 (*)    pO2, Ven 183 (*)    Bicarbonate 28.4 (*)    Acid-Base Excess 4.0 (*)    Calcium , Ion 1.12 (*)    HCT 33.0 (*)    Hemoglobin 11.2 (*)    All other components within normal limits  TROPONIN T, HIGH SENSITIVITY -  Abnormal; Notable for the following components:   Troponin T High Sensitivity 31 (*)    All other components within normal limits  RESP PANEL BY RT-PCR (RSV, FLU A&B, COVID)  RVPGX2  TROPONIN T, HIGH SENSITIVITY    EKG: EKG Interpretation Date/Time:  Wednesday August 09 2024 22:51:53 EST Ventricular Rate:  58 PR Interval:  251 QRS Duration:  126 QT Interval:  490 QTC Calculation: 438 R Axis:   -11  Text Interpretation: Atrial fibrillation Nonspecific intraventricular conduction delay Lateral leads are also involved Reconfirmed by Ruthe Cornet [id number]) on 08/09/2024 11:00:27 PM  Radiology: CT Chest Wo Contrast Result Date: 08/09/2024 EXAM: CT CHEST WITHOUT CONTRAST 08/08/2024 09:54:00 AM TECHNIQUE: CT of the chest was performed without the administration of intravenous contrast. Multiplanar reformatted images are provided for review. Automated exposure control, iterative reconstruction, and/or weight based adjustment of the mA/kV was utilized to reduce the radiation dose to as low as reasonably achievable. COMPARISON: CT of the chest from 05/02/2024, as well as plain film of the chest from 08/01/2024. CLINICAL HISTORY: Left-sided pleural effusion and history of prior lung carcinoma with increasing shortness of breath. FINDINGS: MEDIASTINUM: The heart is mildly enlarged in size. The esophagus is within normal limits. LYMPH NODES: A stable precarinal lymph node is noted. The previously seen hilar  lymphoid tissue is again identified but less well visualized due to the lack of IV contrast. Small AP window nodes are noted stable from the prior study. The right supraclavicular node is again noted and stable. LUNGS AND PLEURA: The lungs demonstrate mild atelectasis in the right base and right upper lobe. This is new from the prior exam. Increasing left-sided pleural effusion is noted with underlying lingular and lower lobe consolidation identified. The previously seen changes in the posterior left lower lobe are again identified, but somewhat obscured by decreased aeration related to the enlarging effusion. SOFT TISSUES/BONES: Right chest wall port is seen. UPPER ABDOMEN: Visualized upper abdomen shows cholelithiasis without complicating factors. A tiny left upper pole renal stone is noted. VASCULATURE: Atherosclerotic calcifications of the thoracic aorta are noted. Coronary calcifications are seen. IMPRESSION: 1. Increasing left-sided pleural effusion with underlying lingular and left lower lobe consolidation. 2. New mild atelectasis in the right base and right upper lobe. 3. Stable mediastinal and right supraclavicular lymph nodes. 4. No evidence of metastatic disease. Electronically signed by: Oneil Devonshire MD 08/09/2024 11:19 PM EST RP Workstation: [id number]   DG Chest Portable 1 View Result Date: 08/09/2024 CLINICAL DATA:  Cough EXAM: PORTABLE CHEST 1 VIEW COMPARISON:  08/01/2024, chest CT 05/02/2024 FINDINGS: Right-sided central venous port tip overlying the cavoatrial region. Cardiomegaly with aortic atherosclerosis. Moderate-sized left pleural effusion, increased compared to most recent prior. Progressive airspace disease at the left base. Vascular congestion and mild diffuse interstitial opacities could be due to background edema. IMPRESSION: 1. Moderate-sized left pleural effusion, increased compared to most recent prior. Progressive airspace disease at the left base may be due to atelectasis or  pneumonia superimposed on patient's underlying known lung cancer and treatment changes. 2. Cardiomegaly with vascular congestion and mild diffuse interstitial opacities which may be due to edema. Electronically Signed   By: Luke Bun M.D.   On: 08/09/2024 22:55     .Critical Care  Performed by: Logan Ubaldo NOVAK, PA-C Authorized by: Logan Ubaldo NOVAK DEVONNA   Critical care provider statement:    Critical care time (minutes):  30   Critical care time was exclusive of:  Separately billable procedures and treating other patients  Critical care was necessary to treat or prevent imminent or life-threatening deterioration of the following conditions:  Respiratory failure   Critical care was time spent personally by me on the following activities:  Development of treatment plan with patient or surrogate, discussions with consultants, evaluation of patient's response to treatment, examination of patient, ordering and review of laboratory studies, ordering and review of radiographic studies, ordering and performing treatments and interventions, pulse oximetry, re-evaluation of patient's condition and review of old charts   Care discussed with: admitting provider      Medications Ordered in the ED  vancomycin  (VANCOREADY) IVPB 1500 mg/300 mL (1,500 mg Intravenous New Bag/Given 08/10/24 0042)  acetaminophen  (TYLENOL ) tablet 1,000 mg (has no administration in time range)  ipratropium-albuterol  (DUONEB) 0.5-2.5 (3) MG/3ML nebulizer solution 3 mL (3 mLs Nebulization Given 08/09/24 2250)  magnesium  sulfate IVPB 2 g 50 mL (0 g Intravenous Stopped 08/09/24 2355)  methylPREDNISolone  sodium succinate (SOLU-MEDROL ) 125 mg/2 mL injection 125 mg (125 mg Intravenous Given 08/09/24 2253)  ceFEPIme  (MAXIPIME ) 2 g in sodium chloride  0.9 % 100 mL IVPB (0 g Intravenous Stopped 08/10/24 0042)  albuterol  (PROVENTIL ) (2.5 MG/3ML) 0.083% nebulizer solution (10 mg/hr Nebulization Given 08/10/24 0104)                                     Medical Decision Making Amount and/or Complexity of Data Reviewed Labs: ordered. Radiology: ordered.  Risk OTC drugs. Prescription drug management. Decision regarding hospitalization.   This patient presents to the ED for concern of shortness of breath, this involves an extensive number of treatment options, and is a complaint that carries with it a high risk of complications and morbidity.  The differential diagnosis includes COPD exacerbation, fluid overload, pneumonia, pleural effusion, others   Co morbidities / Chronic conditions that complicate the patient evaluation  As noted in HPI   Additional history obtained:  Additional history obtained from EMR External records from outside source obtained and reviewed including current immunotherapy with Imfinzi , status post 2 cycles. Previous chemotherapy completed on September 9   Lab Tests:  I Ordered, and personally interpreted labs.  The pertinent results include: No leukocytosis, creatinine grossly at baseline, BNP 2561 (was 7398 2 weeks ago), troponin 31 (was 94 2 weeks ago)   Imaging Studies ordered:  I ordered imaging studies including chest x-ray  I independently visualized and interpreted imaging which showed  1. Moderate-sized left pleural effusion, increased compared to most  recent prior. Progressive airspace disease at the left base may be  due to atelectasis or pneumonia superimposed on patient's underlying  known lung cancer and treatment changes.  2. Cardiomegaly with vascular congestion and mild diffuse  interstitial opacities which may be due to edema.   I agree with the radiologist interpretation   Cardiac Monitoring: / EKG:  The patient was maintained on a cardiac monitor.  I personally viewed and interpreted the cardiac monitored which showed an underlying rhythm of: atrial fibrillation   Problem List / ED Course / Critical interventions / Medication management   I ordered medication  including Solu-Medrol , DuoNeb, magnesium , cefepime , vancomycin  Reevaluation of the patient after these medicines showed that the patient improved   Consultations Obtained:  I requested consultation with the hospitalist, Dr.Rathore, and discussed lab and imaging findings as well as pertinent plan - they recommend: plan on admission   Social Determinants of Health:  Patient is a former smoker  Test / Admission - Considered:  Patient with signs of COPD exacerbation and worsening pleural effusion.  Antibiotics initiated.  Patient currently on BiPAP.  Patient needs admission to the hospital for further management including likely thoracentesis.      Final diagnoses:  COPD exacerbation [facility])  Pleural effusion    ED Discharge Orders     None          Logan Ubaldo KATHEE DEVONNA 08/10/24 [id number]    Ruthe Cornet, DO 08/10/24 1506  "

## 2024-08-09 NOTE — ED Triage Notes (Signed)
 Pt BIB [facility] from home. Sudden onset of SHOB x20 min. Upon EMS arrival pt was 60% RA, 40RR, cyanotic fingertips, diminished and wheezing in all lung fields. Hx lung cancer on right side and copd.  EMS  2 duonebs 100% CPAP 150/80BP NSR 80P

## 2024-08-10 ENCOUNTER — Inpatient Hospital Stay (HOSPITAL_BASED_OUTPATIENT_CLINIC_OR_DEPARTMENT_OTHER): Admitting: Radiology

## 2024-08-10 ENCOUNTER — Ambulatory Visit: Admitting: Nurse Practitioner

## 2024-08-10 ENCOUNTER — Inpatient Hospital Stay (HOSPITAL_COMMUNITY)

## 2024-08-10 DIAGNOSIS — I5033 Acute on chronic diastolic (congestive) heart failure: Secondary | ICD-10-CM | POA: Diagnosis not present

## 2024-08-10 DIAGNOSIS — E1122 Type 2 diabetes mellitus with diabetic chronic kidney disease: Secondary | ICD-10-CM | POA: Diagnosis present

## 2024-08-10 DIAGNOSIS — I495 Sick sinus syndrome: Secondary | ICD-10-CM | POA: Diagnosis present

## 2024-08-10 DIAGNOSIS — F39 Unspecified mood [affective] disorder: Secondary | ICD-10-CM | POA: Diagnosis present

## 2024-08-10 DIAGNOSIS — I2489 Other forms of acute ischemic heart disease: Secondary | ICD-10-CM | POA: Diagnosis present

## 2024-08-10 DIAGNOSIS — J9621 Acute and chronic respiratory failure with hypoxia: Secondary | ICD-10-CM | POA: Diagnosis present

## 2024-08-10 DIAGNOSIS — Z7901 Long term (current) use of anticoagulants: Secondary | ICD-10-CM | POA: Diagnosis not present

## 2024-08-10 DIAGNOSIS — Z794 Long term (current) use of insulin: Secondary | ICD-10-CM | POA: Diagnosis not present

## 2024-08-10 DIAGNOSIS — J9811 Atelectasis: Secondary | ICD-10-CM | POA: Diagnosis present

## 2024-08-10 DIAGNOSIS — J441 Chronic obstructive pulmonary disease with (acute) exacerbation: Secondary | ICD-10-CM | POA: Diagnosis present

## 2024-08-10 DIAGNOSIS — Z7951 Long term (current) use of inhaled steroids: Secondary | ICD-10-CM | POA: Diagnosis not present

## 2024-08-10 DIAGNOSIS — J918 Pleural effusion in other conditions classified elsewhere: Secondary | ICD-10-CM | POA: Diagnosis present

## 2024-08-10 DIAGNOSIS — N179 Acute kidney failure, unspecified: Secondary | ICD-10-CM | POA: Diagnosis present

## 2024-08-10 DIAGNOSIS — Z7982 Long term (current) use of aspirin: Secondary | ICD-10-CM | POA: Diagnosis not present

## 2024-08-10 DIAGNOSIS — I2510 Atherosclerotic heart disease of native coronary artery without angina pectoris: Secondary | ICD-10-CM | POA: Diagnosis present

## 2024-08-10 DIAGNOSIS — I69351 Hemiplegia and hemiparesis following cerebral infarction affecting right dominant side: Secondary | ICD-10-CM | POA: Diagnosis not present

## 2024-08-10 DIAGNOSIS — Z1152 Encounter for screening for COVID-19: Secondary | ICD-10-CM | POA: Diagnosis not present

## 2024-08-10 DIAGNOSIS — N1832 Chronic kidney disease, stage 3b: Secondary | ICD-10-CM | POA: Diagnosis present

## 2024-08-10 DIAGNOSIS — Z79899 Other long term (current) drug therapy: Secondary | ICD-10-CM | POA: Diagnosis not present

## 2024-08-10 DIAGNOSIS — E785 Hyperlipidemia, unspecified: Secondary | ICD-10-CM | POA: Diagnosis present

## 2024-08-10 DIAGNOSIS — F32A Depression, unspecified: Secondary | ICD-10-CM | POA: Diagnosis present

## 2024-08-10 DIAGNOSIS — I5031 Acute diastolic (congestive) heart failure: Secondary | ICD-10-CM

## 2024-08-10 DIAGNOSIS — I69322 Dysarthria following cerebral infarction: Secondary | ICD-10-CM | POA: Diagnosis not present

## 2024-08-10 DIAGNOSIS — R079 Chest pain, unspecified: Secondary | ICD-10-CM

## 2024-08-10 DIAGNOSIS — I130 Hypertensive heart and chronic kidney disease with heart failure and stage 1 through stage 4 chronic kidney disease, or unspecified chronic kidney disease: Secondary | ICD-10-CM | POA: Diagnosis present

## 2024-08-10 DIAGNOSIS — D631 Anemia in chronic kidney disease: Secondary | ICD-10-CM | POA: Diagnosis present

## 2024-08-10 HISTORY — PX: IR THORACENTESIS RIGHT ASP PLEURAL SPACE W/IMG GUIDE: IMG5380

## 2024-08-10 LAB — ECHOCARDIOGRAM COMPLETE
AR max vel: 2.16 cm2
AV Area VTI: 2.11 cm2
AV Area mean vel: 2.18 cm2
AV Mean grad: 6 mmHg
AV Peak grad: 11.6 mmHg
Ao pk vel: 1.7 m/s
Area-P 1/2: 1.89 cm2
Calc EF: 55.4 %
Height: 63 in
MV VTI: 1.67 cm2
S' Lateral: 3.3 cm
Single Plane A2C EF: 58.3 %
Single Plane A4C EF: 50.2 %
Weight: 2880 [oz_av]

## 2024-08-10 LAB — GLUCOSE, CAPILLARY
Glucose-Capillary: 167 mg/dL — ABNORMAL HIGH (ref 70–99)
Glucose-Capillary: 198 mg/dL — ABNORMAL HIGH (ref 70–99)
Glucose-Capillary: 245 mg/dL — ABNORMAL HIGH (ref 70–99)

## 2024-08-10 LAB — GRAM STAIN

## 2024-08-10 LAB — BASIC METABOLIC PANEL WITH GFR
Anion gap: 12 (ref 5–15)
BUN: 25 mg/dL — ABNORMAL HIGH (ref 8–23)
CO2: 24 mmol/L (ref 22–32)
Calcium: 9.1 mg/dL (ref 8.9–10.3)
Chloride: 103 mmol/L (ref 98–111)
Creatinine, Ser: 1.72 mg/dL — ABNORMAL HIGH (ref 0.44–1.00)
GFR, Estimated: 33 mL/min — ABNORMAL LOW
Glucose, Bld: 226 mg/dL — ABNORMAL HIGH (ref 70–99)
Potassium: 5 mmol/L (ref 3.5–5.1)
Sodium: 138 mmol/L (ref 135–145)

## 2024-08-10 LAB — CBG MONITORING, ED
Glucose-Capillary: 217 mg/dL — ABNORMAL HIGH (ref 70–99)
Glucose-Capillary: 214 mg/dL — ABNORMAL HIGH (ref 70–99)
Glucose-Capillary: 177 mg/dL — ABNORMAL HIGH (ref 70–99)

## 2024-08-10 LAB — BODY FLUID CELL COUNT WITH DIFFERENTIAL
Eos, Fluid: 0 %
Lymphs, Fluid: 49 %
Monocyte-Macrophage-Serous Fluid: 16 % — ABNORMAL LOW (ref 50–90)
Neutrophil Count, Fluid: 35 % — ABNORMAL HIGH (ref 0–25)
Total Nucleated Cell Count, Fluid: 522 uL (ref 0–1000)

## 2024-08-10 LAB — PROCALCITONIN: Procalcitonin: 0.23 ng/mL

## 2024-08-10 LAB — TROPONIN T, HIGH SENSITIVITY
Troponin T High Sensitivity: 39 ng/L — ABNORMAL HIGH (ref 0–19)
Troponin T High Sensitivity: 32 ng/L — ABNORMAL HIGH (ref 0–19)

## 2024-08-10 LAB — LACTATE DEHYDROGENASE: LDH: 283 U/L — ABNORMAL HIGH (ref 105–235)

## 2024-08-10 MED ORDER — IPRATROPIUM-ALBUTEROL 0.5-2.5 (3) MG/3ML IN SOLN
3.0000 mL | Freq: Four times a day (QID) | RESPIRATORY_TRACT | Status: DC
  Administered 2024-08-10 – 2024-08-14 (×12): 3 mL via RESPIRATORY_TRACT
  Filled 2024-08-10 (×13): qty 3

## 2024-08-10 MED ORDER — INSULIN ASPART 100 UNIT/ML IJ SOLN
0.0000 [IU] | INTRAMUSCULAR | Status: DC
  Administered 2024-08-10 (×2): 2 [IU] via SUBCUTANEOUS
  Administered 2024-08-10 (×3): 3 [IU] via SUBCUTANEOUS
  Administered 2024-08-11: 2 [IU] via SUBCUTANEOUS
  Administered 2024-08-11: 1 [IU] via SUBCUTANEOUS
  Administered 2024-08-11: 3 [IU] via SUBCUTANEOUS
  Administered 2024-08-11: 2 [IU] via SUBCUTANEOUS
  Administered 2024-08-11: 5 [IU] via SUBCUTANEOUS
  Administered 2024-08-11: 7 [IU] via SUBCUTANEOUS
  Administered 2024-08-12: 5 [IU] via SUBCUTANEOUS
  Administered 2024-08-12: 1 [IU] via SUBCUTANEOUS
  Administered 2024-08-12: 3 [IU] via SUBCUTANEOUS
  Administered 2024-08-13: 7 [IU] via SUBCUTANEOUS
  Administered 2024-08-13: 3 [IU] via SUBCUTANEOUS
  Administered 2024-08-13: 1 [IU] via SUBCUTANEOUS
  Administered 2024-08-13: 7 [IU] via SUBCUTANEOUS
  Administered 2024-08-14: 5 [IU] via SUBCUTANEOUS
  Administered 2024-08-14: 9 [IU] via SUBCUTANEOUS
  Administered 2024-08-14: 2 [IU] via SUBCUTANEOUS
  Administered 2024-08-14: 5 [IU] via SUBCUTANEOUS
  Administered 2024-08-15: 1 [IU] via SUBCUTANEOUS
  Administered 2024-08-15: 3 [IU] via SUBCUTANEOUS
  Filled 2024-08-10: qty 3
  Filled 2024-08-10: qty 1
  Filled 2024-08-10: qty 7
  Filled 2024-08-10: qty 2
  Filled 2024-08-10: qty 1
  Filled 2024-08-10 (×2): qty 3
  Filled 2024-08-10: qty 2
  Filled 2024-08-10: qty 3
  Filled 2024-08-10: qty 5
  Filled 2024-08-10: qty 3
  Filled 2024-08-10: qty 5
  Filled 2024-08-10: qty 3
  Filled 2024-08-10: qty 1
  Filled 2024-08-10: qty 5
  Filled 2024-08-10: qty 1
  Filled 2024-08-10: qty 5
  Filled 2024-08-10: qty 9
  Filled 2024-08-10: qty 2
  Filled 2024-08-10: qty 7
  Filled 2024-08-10 (×2): qty 2
  Filled 2024-08-10: qty 7
  Filled 2024-08-10: qty 3

## 2024-08-10 MED ORDER — ALBUTEROL SULFATE (2.5 MG/3ML) 0.083% IN NEBU: 2.5000 mg | INHALATION_SOLUTION | RESPIRATORY_TRACT | Status: DC | PRN

## 2024-08-10 MED ORDER — INSULIN GLARGINE-YFGN 100 UNIT/ML ~~LOC~~ SOLN
22.0000 [IU] | Freq: Every day | SUBCUTANEOUS | Status: DC
  Administered 2024-08-10 – 2024-08-15 (×6): 22 [IU] via SUBCUTANEOUS
  Filled 2024-08-10 (×6): qty 0.22

## 2024-08-10 MED ORDER — ASPIRIN 81 MG PO TBEC: 81.0000 mg | DELAYED_RELEASE_TABLET | Freq: Every day | ORAL | Status: DC

## 2024-08-10 MED ORDER — CITALOPRAM HYDROBROMIDE 10 MG PO TABS: 10.0000 mg | ORAL_TABLET | Freq: Every day | ORAL | Status: DC

## 2024-08-10 MED ORDER — ACETAMINOPHEN 325 MG PO TABS: 650.0000 mg | ORAL_TABLET | Freq: Four times a day (QID) | ORAL | Status: DC | PRN

## 2024-08-10 MED ORDER — ALBUTEROL SULFATE (2.5 MG/3ML) 0.083% IN NEBU
10.0000 mg/h | INHALATION_SOLUTION | Freq: Once | RESPIRATORY_TRACT | Status: AC
  Administered 2024-08-10: 10 mg/h via RESPIRATORY_TRACT
  Filled 2024-08-10: qty 12

## 2024-08-10 MED ORDER — PERFLUTREN LIPID MICROSPHERE
1.0000 mL | INTRAVENOUS | Status: AC | PRN
  Administered 2024-08-10: 2 mL via INTRAVENOUS

## 2024-08-10 MED ORDER — LIDOCAINE-EPINEPHRINE 1 %-1:100000 IJ SOLN
INTRAMUSCULAR | Status: AC
  Filled 2024-08-10: qty 20

## 2024-08-10 MED ORDER — METHYLPREDNISOLONE SODIUM SUCC 125 MG IJ SOLR
80.0000 mg | INTRAMUSCULAR | Status: DC
  Administered 2024-08-10 – 2024-08-12 (×3): 80 mg via INTRAVENOUS
  Filled 2024-08-10 (×3): qty 2

## 2024-08-10 MED ORDER — FUROSEMIDE 10 MG/ML IJ SOLN
40.0000 mg | Freq: Once | INTRAMUSCULAR | Status: AC
  Administered 2024-08-10: 40 mg via INTRAVENOUS
  Filled 2024-08-10: qty 4

## 2024-08-10 MED ORDER — INSULIN GLARGINE-YFGN 100 UNIT/ML ~~LOC~~ SOLN
10.0000 [IU] | Freq: Every day | SUBCUTANEOUS | Status: DC
  Administered 2024-08-10 – 2024-08-14 (×5): 10 [IU] via SUBCUTANEOUS
  Filled 2024-08-10 (×6): qty 0.1

## 2024-08-10 MED ORDER — ROSUVASTATIN CALCIUM 20 MG PO TABS: 40.0000 mg | ORAL_TABLET | Freq: Every day | ORAL | Status: DC

## 2024-08-10 MED ORDER — APIXABAN 5 MG PO TABS: 5.0000 mg | ORAL_TABLET | Freq: Two times a day (BID) | ORAL | Status: DC

## 2024-08-10 MED ORDER — BUDESONIDE 0.25 MG/2ML IN SUSP
0.2500 mg | Freq: Two times a day (BID) | RESPIRATORY_TRACT | Status: DC
  Administered 2024-08-10 – 2024-08-12 (×4): 0.25 mg via RESPIRATORY_TRACT
  Filled 2024-08-10 (×4): qty 2

## 2024-08-10 MED ORDER — ACETAMINOPHEN 650 MG RE SUPP: 650.0000 mg | Freq: Four times a day (QID) | RECTAL | Status: DC | PRN

## 2024-08-10 MED ORDER — ACETAMINOPHEN 500 MG PO TABS
1000.0000 mg | ORAL_TABLET | Freq: Once | ORAL | Status: AC
  Administered 2024-08-10: 1000 mg via ORAL
  Filled 2024-08-10: qty 2

## 2024-08-10 NOTE — ED Notes (Signed)
 Called to order a late lunch will arrive within a hour

## 2024-08-10 NOTE — ED Notes (Signed)
 CCMD called and pt placed on cardiac monitor.

## 2024-08-10 NOTE — Telephone Encounter (Signed)
 Tresiba  & Novolog  Pt Assistance received, left message for pt

## 2024-08-10 NOTE — H&P (Signed)
 " History and Physical    KEBRINA FRIEND FMW:[id number] DOB: 02-10-1961 DOA: 08/09/2024  PCP: Oris [name], NP  Patient coming from: Home  Chief Complaint: Shortness of breath  HPI: RADLEY TESTON is a 64 y.o. female with medical history significant of stage III non-small cell lung cancer status post chemotherapy and currently on active immunotherapy (Imfinzi ), history of PE on Eliquis , CAD, chronic HFpEF, AV block, sick sinus syndrome, COPD, chronic bronchitis, hypertension, hyperlipidemia, insulin -dependent type 2 diabetes, history of CVA with residual right hemiparesis/dysphagia/dysarthria, CKD stage IIIb.  Recent hospital admission last month 12/26-12/29 for CHF exacerbation, aspiration pneumonia, and AKI.  Patient then had a follow-up visit with PCP in 1/6 and dose of Lasix  was increased to 20 mg twice daily.  Patient presents to the ED tonight via EMS from home for evaluation of sudden onset shortness of breath/respiratory distress.  She was saturating 60% on room air with cyanotic fingertips, tachypneic to the 40s, and wheezing on EMS arrival.  She was placed on CPAP with improvement of oxygen saturation to 100%.  She was given 2 DuoNebs prior to arrival.  Patient remained tachypneic on arrival to the ED and was placed on BiPAP.  Afebrile.  EKG showing sinus or ectopic atrial rhythm, PR prolongation, QTc 531, and baseline wander in multiple leads.  Labs showing no leukocytosis, hemoglobin 10.4 (stable), glucose 202, bicarb 23, creatinine 1.6 (close to baseline), troponin 31, proBNP 2561, COVID/influenza/RSV PCR negative, VBG with pH 7.43 and pCO2 42.8.  Chest x-ray showing moderate-sized left pleural effusion, pulmonary edema, and progressive airspace disease at the left base which may be due to atelectasis versus pneumonia.  Patient was given Tylenol , albuterol , DuoNeb, Solu-Medrol  125 mg, cefepime , IV mag 2 g, and vancomycin .  [facility] called to admit.  History limited as patient is currently  on BiPAP.  Husband states patient has continued to have shortness of breath since her hospital discharge 2 weeks ago but her breathing became much worse tonight and EMS had to be called.  Patient states she had some substernal chest discomfort tonight which has now resolved and she is reporting improvement of her dyspnea after being placed on BiPAP.  She reports taking all of her home medications as prescribed including Lasix  20 mg twice daily, Eliquis , and using her home inhalers.  She reports history of chronic right leg swelling for several years since after having a stroke.  No other complaints.  Review of Systems:  Review of Systems  All other systems reviewed and are negative.   Past Medical History:  Diagnosis Date   Acute pulmonary embolism without acute cor pulmonale (HCC) 01/13/2024   Bradycardia    SR/SB with up to 3.5 second pause (asymptomatic) on ~ 10/2017 event monitor; saw EP Dr. Kelsie: avoid AV nodal blocking agents   Chronic kidney disease    COPD (chronic obstructive pulmonary disease) (HCC)    Depression    Depression with anxiety 08/29/2021   Diabetes mellitus    Dysarthria, post-stroke    Dysrhythmia    Ganglion cyst 03/09/2012   Heart block AV complete (HCC) 12/11/2023   Hematuria 01/24/2024   History of kidney stones    Hyperlipidemia    Hypertension    Hypokalemia    Ingrown toenail of right foot 05/15/2024   Myocardial infarction [facility])    NSTEMI   Non-ST elevation (NSTEMI) myocardial infarction (HCC) 12/11/2023   Obesity, unspecified 12/17/2011   Pneumonia    Pneumonia of left lower lobe due to infectious  organism 12/12/2023   PONV (postoperative nausea and vomiting)    Skin lesion of face 11/17/2022   Stroke [facility])    2019   right side hemiparesis    Past Surgical History:  Procedure Laterality Date   CESAREAN SECTION     x 3   IR IMAGING GUIDED PORT INSERTION  03/09/2024   LEFT HEART CATH AND CORONARY ANGIOGRAPHY N/A 12/28/2023   Procedure: LEFT  HEART CATH AND CORONARY ANGIOGRAPHY;  Surgeon: Elmira Newman PARAS, MD;  Location: [facility] INVASIVE CV LAB;  Service: Cardiovascular;  Laterality: N/A;   ORIF WRIST FRACTURE Right 05/04/2018   Procedure: OPEN REDUCTION INTERNAL FIXATION (ORIF) RIGHT  WRIST FRACTURE;  Surgeon: Cristy Bonner DASEN, MD;  Location: [facility] OR;  Service: Orthopedics;  Laterality: Right;   TUMOR REMOVAL     left shoulder   VIDEO BRONCHOSCOPY WITH ENDOBRONCHIAL NAVIGATION  01/03/2024   Procedure: VIDEO BRONCHOSCOPY WITH ENDOBRONCHIAL NAVIGATION;  Surgeon: Shelah Lamar RAMAN, MD;  Location: [facility] ENDOSCOPY;  Service: Pulmonary;;   VIDEO BRONCHOSCOPY WITH ENDOBRONCHIAL ULTRASOUND Bilateral 01/03/2024   Procedure: BRONCHOSCOPY, WITH EBUS;  Surgeon: Shelah Lamar RAMAN, MD;  Location: [facility] ENDOSCOPY;  Service: Pulmonary;  Laterality: Bilateral;     reports that she quit smoking about 11 years ago. Her smoking use included cigarettes. She has a 20 pack-year smoking history. She has never used smokeless tobacco. She reports that she does not currently use alcohol. She reports that she does not use drugs.  Allergies[1]  Family History  Problem Relation Age of Onset   Diabetes Mother    Stroke Mother    Heart disease Father    Cancer Father    Stroke Father     Prior to Admission medications  Medication Sig Start Date End Date Taking? Authorizing Provider  albuterol  (PROVENTIL ) (2.5 MG/3ML) 0.083% nebulizer solution Inhale 3 mLs (2.5 mg total) into the lungs every 6 (six) hours as needed for wheezing or shortness of breath. 01/04/24  Yes Darci Pore, MD  albuterol  (VENTOLIN  HFA) 108 (90 Base) MCG/ACT inhaler Inhale 2 puffs into the lungs every 6 (six) hours as needed for wheezing or shortness of breath. 12/14/23  Yes Perri DELENA Meliton Mickey., MD  amLODipine  (NORVASC ) 10 MG tablet Take 1 tablet (10 mg total) by mouth daily. 07/24/24  Yes Dennise Lavada POUR, MD  aspirin  EC 81 MG tablet Take 1 tablet (81 mg total) by mouth daily. Swallow whole.  12/15/23  Yes Perri DELENA Meliton Mickey., MD  budesonide -glycopyrrolate -formoterol  (BREZTRI  AEROSPHERE) 160-9-4.8 MCG/ACT AERO inhaler Inhale 2 puffs into the lungs 2 (two) times daily. 12/14/23  Yes Perri DELENA Meliton Mickey., MD  carvedilol  (COREG ) 3.125 MG tablet Take 1 tablet (3.125 mg total) by mouth 2 (two) times daily with a meal. 07/24/24  Yes Dennise Lavada POUR, MD  citalopram  (CELEXA ) 10 MG tablet TAKE 1 TABLET BY MOUTH DAILY 07/06/24  Yes Early, Sara E, NP  Continuous Glucose Sensor (DEXCOM G7 SENSOR) MISC Take 1 Device by mouth See admin instructions. Change sensor every 10 days   Yes [provider]  ELIQUIS  5 MG TABS tablet TAKE 1 TABLET BY MOUTH 2 TIMES A DAY 06/20/24  Yes Sherrod Sherrod, MD  furosemide  (LASIX ) 20 MG tablet Take 2 tablets (40 mg total) by mouth daily. 08/01/24 10/30/24 Yes Early, Sara E, NP  insulin  aspart (NOVOLOG  FLEXPEN) 100 UNIT/ML FlexPen Inject 2-10 Units into the skin 3 (three) times daily with meals.   Yes [provider]  insulin  degludec (TRESIBA  FLEXTOUCH) 100 UNIT/ML FlexTouch  Pen 22u AM and 10u PM. 05/15/24  Yes Early, Sara E, NP  lidocaine -prilocaine  (EMLA ) cream Apply to the Port-A-Cath site 30-60 minutes before treatment 03/21/24  Yes Sherrod Sherrod, MD  Melatonin 5 MG CHEW Chew 10 mg by mouth at bedtime.   Yes [provider]  Multiple Vitamins-Minerals (MULTIVITAMIN GUMMIES WOMENS) CHEW Chew 2 each by mouth in the morning.   Yes [provider]  olmesartan  (BENICAR ) 20 MG tablet Take 1 tablet (20 mg total) by mouth in the morning. 07/24/24  Yes Singh, Prashant K, MD  potassium chloride  (KLOR-CON  M) 10 MEQ tablet Take 1 tablet (10 mEq total) by mouth daily. 07/24/24  Yes Singh, Prashant K, MD  rosuvastatin  (CRESTOR ) 40 MG tablet TAKE 1 TABLET BY MOUTH DAILY 05/15/24  Yes Early, Sara E, NP  UNABLE TO FIND Take 1 tablet by mouth with breakfast, with lunch, and with evening meal. GlucoGold -Berberine, Concentrated Cinnamon,  Chromium, Banaba Leaf Extract   Yes [provider]  prochlorperazine  (COMPAZINE ) 10 MG tablet Take 1 tablet (10 mg total) by mouth every 6 (six) hours as needed. Patient not taking: Reported on 08/10/2024 02/15/24   Heilingoetter, Calton CROME, PA-C    Physical Exam: Vitals:   08/09/24 2330 08/09/24 2345 08/10/24 0000 08/10/24 0100  BP: (!) 88/49 (!) 98/49 (!) 98/43 104/60  Pulse: 81 80 77 (!) 39  Resp: (!) 31 (!) 35 (!) 29 (!) 28  Temp:      TempSrc:      SpO2: 100% 100% 100% 100%  Weight:      Height:        Physical Exam Vitals reviewed.  Constitutional:      General: She is not in acute distress. HENT:     Head: Normocephalic and atraumatic.  Eyes:     Extraocular Movements: Extraocular movements intact.  Cardiovascular:     Rate and Rhythm: Normal rate and regular rhythm.     Pulses: Normal pulses.  Pulmonary:     Effort: No respiratory distress.     Breath sounds: Wheezing present.     Comments: Diminished breath sounds at the left lung base Abdominal:     General: Bowel sounds are normal.     Palpations: Abdomen is soft.     Tenderness: There is no abdominal tenderness. There is no guarding.  Musculoskeletal:     Cervical back: Normal range of motion.     Right lower leg: Edema present.     Left lower leg: Edema present.  Skin:    General: Skin is warm and dry.  Neurological:     General: No focal deficit present.     Mental Status: She is alert and oriented to person, place, and time.     Labs on Admission: I have personally reviewed following labs and imaging studies  CBC: Recent Labs  Lab 08/09/24 2240 08/09/24 2304  WBC 8.4  --   NEUTROABS 6.8  --   HGB 10.4* 11.2*  HCT 33.5* 33.0*  MCV 89.8  --   PLT 246  --    Basic Metabolic Panel: Recent Labs  Lab 08/09/24 2240 08/09/24 2304  NA 138 139  K 4.7 4.8  CL 101  --   CO2 23  --   GLUCOSE 202*  --   BUN 23  --   CREATININE 1.64*  --   CALCIUM  9.0  --    GFR: Estimated  Creatinine Clearance: 36 mL/min (A) (by C-G formula based on SCr of  1.64 mg/dL (H)). Liver Function Tests: Recent Labs  Lab 08/09/24 2240  AST 22  ALT 11  ALKPHOS 87  BILITOT 0.3  PROT 6.9  ALBUMIN 3.5   No results for input(s): LIPASE, AMYLASE in the last 168 hours. No results for input(s): AMMONIA in the last 168 hours. Coagulation Profile: No results for input(s): INR, PROTIME in the last 168 hours. Cardiac Enzymes: No results for input(s): CKTOTAL, CKMB, CKMBINDEX, TROPONINI in the last 168 hours. BNP (last 3 results) Recent Labs    07/21/24 0756 07/22/24 0634 08/09/24 2240  PROBNP 3,463.0* 7,398.0* 2,561.0*   HbA1C: No results for input(s): HGBA1C in the last 72 hours. CBG: No results for input(s): GLUCAP in the last 168 hours. Lipid Profile: No results for input(s): CHOL, HDL, LDLCALC, TRIG, CHOLHDL, LDLDIRECT in the last 72 hours. Thyroid  Function Tests: No results for input(s): TSH, T4TOTAL, FREET4, T3FREE, THYROIDAB in the last 72 hours. Anemia Panel: No results for input(s): VITAMINB12, FOLATE, FERRITIN, TIBC, IRON, RETICCTPCT in the last 72 hours. Urine analysis:    Component Value Date/Time   COLORURINE YELLOW 07/21/2024 0847   APPEARANCEUR CLOUDY (A) 07/21/2024 0847   LABSPEC 1.016 07/21/2024 0847   PHURINE 5.0 07/21/2024 0847   GLUCOSEU 50 (A) 07/21/2024 0847   HGBUR LARGE (A) 07/21/2024 0847   BILIRUBINUR NEGATIVE 07/21/2024 0847   KETONESUR NEGATIVE 07/21/2024 0847   PROTEINUR >=300 (A) 07/21/2024 0847   UROBILINOGEN 0.2 01/26/2007 1410   NITRITE NEGATIVE 07/21/2024 0847   LEUKOCYTESUR NEGATIVE 07/21/2024 0847    Radiological Exams on Admission: CT Chest Wo Contrast Result Date: 08/09/2024 EXAM: CT CHEST WITHOUT CONTRAST 08/08/2024 09:54:00 AM TECHNIQUE: CT of the chest was performed without the administration of intravenous contrast. Multiplanar reformatted images are provided for review.  Automated exposure control, iterative reconstruction, and/or weight based adjustment of the mA/kV was utilized to reduce the radiation dose to as low as reasonably achievable. COMPARISON: CT of the chest from 05/02/2024, as well as plain film of the chest from 08/01/2024. CLINICAL HISTORY: Left-sided pleural effusion and history of prior lung carcinoma with increasing shortness of breath. FINDINGS: MEDIASTINUM: The heart is mildly enlarged in size. The esophagus is within normal limits. LYMPH NODES: A stable precarinal lymph node is noted. The previously seen hilar lymphoid tissue is again identified but less well visualized due to the lack of IV contrast. Small AP window nodes are noted stable from the prior study. The right supraclavicular node is again noted and stable. LUNGS AND PLEURA: The lungs demonstrate mild atelectasis in the right base and right upper lobe. This is new from the prior exam. Increasing left-sided pleural effusion is noted with underlying lingular and lower lobe consolidation identified. The previously seen changes in the posterior left lower lobe are again identified, but somewhat obscured by decreased aeration related to the enlarging effusion. SOFT TISSUES/BONES: Right chest wall port is seen. UPPER ABDOMEN: Visualized upper abdomen shows cholelithiasis without complicating factors. A tiny left upper pole renal stone is noted. VASCULATURE: Atherosclerotic calcifications of the thoracic aorta are noted. Coronary calcifications are seen. IMPRESSION: 1. Increasing left-sided pleural effusion with underlying lingular and left lower lobe consolidation. 2. New mild atelectasis in the right base and right upper lobe. 3. Stable mediastinal and right supraclavicular lymph nodes. 4. No evidence of metastatic disease. Electronically signed by: Oneil Devonshire MD 08/09/2024 11:19 PM EST RP Workstation: [id number]   DG Chest Portable 1 View Result Date: 08/09/2024 CLINICAL DATA:  Cough EXAM: PORTABLE  CHEST 1 VIEW COMPARISON:  08/01/2024, chest CT 05/02/2024 FINDINGS: Right-sided central venous port tip overlying the cavoatrial region. Cardiomegaly with aortic atherosclerosis. Moderate-sized left pleural effusion, increased compared to most recent prior. Progressive airspace disease at the left base. Vascular congestion and mild diffuse interstitial opacities could be due to background edema. IMPRESSION: 1. Moderate-sized left pleural effusion, increased compared to most recent prior. Progressive airspace disease at the left base may be due to atelectasis or pneumonia superimposed on patient's underlying known lung cancer and treatment changes. 2. Cardiomegaly with vascular congestion and mild diffuse interstitial opacities which may be due to edema. Electronically Signed   By: Luke Bun M.D.   On: 08/09/2024 22:55    Assessment and Plan  Acute on chronic HFpEF proBNP 2561.  Chest x-ray showing increasing moderate-sized left pleural effusion and pulmonary edema.  Last echo done in June 2025 showing EF 60 to 65%, grade 2 diastolic dysfunction, moderate left atrial dilation, and mild mitral regurgitation.  Patient is currently taking Lasix  20 mg twice daily at home.  Most recent SBP 130 and MAP 77.  IV Lasix  40 mg x 1 ordered.  Reassess in AM and order additional doses of Lasix  accordingly.  Monitor intake and output, daily weights.  Repeat echocardiogram ordered.  ?Pneumonia Chest x-ray showing progressive airspace disease at the left lung base which may be due to atelectasis versus pneumonia.  COVID/influenza/RSV PCR negative. ?Bacterial pneumonia given no fever or leukocytosis.  Patient was given vancomycin  and cefepime  in the ED.  Check procalcitonin level.  Left pleural effusion ?Malignant given history of lung cancer versus related to heart failure versus parapneumonic.  Patient is on Eliquis  which has been held.  Placed orders for IR imaging guided thoracentesis and pleural fluid analysis  labs ordered.  Acute COPD exacerbation Work of breathing has improved with BiPAP but continues to have wheezing.  Continue treatment with Solu-Medrol  40 mg every 12 hours, DuoNeb every 6 hours, Pulmicort  neb twice daily, and albuterol  neb every 2 hours PRN.  Acute hypoxemic respiratory failure Oxygen saturation 60% on room air with EMS.  Currently saturating well on BiPAP and work of breathing has improved.  VBG without evidence of hypercapnia.  Continue BiPAP, wean as tolerated.  Continuous pulse ox.  Chest pain and elevated troponin History of CAD Left heart cath done in June 2025 showing 3 distinct occlusions of mid LAD, proximal OM 2, mid RCA with faint collateralization.  Cardiology had felt that there were no good interventional options and patient had previously declined CABG.  Troponin 31> 39.  No longer having active chest pain at this time.  Suspect troponin elevation is due to demand ischemia in the setting of problems listed above.  Continue to trend troponin.  Stage III non-small cell lung cancer Finished chemotherapy in September 2025 and currently on immunotherapy with Imfinzi  every 4 weeks.  Outpatient oncology follow-up.  History of PE Holding Eliquis  in anticipation of thoracentesis as above.  History of sick sinus syndrome Patient is on Coreg  at home and heart rate currently in the 40s.  Not hypotensive.  Hold Coreg  and continue cardiac monitoring.  Hypertension Giving IV Lasix  as above.  Resume home antihypertensives in the morning if blood pressure remains stable and when no longer NPO.  Holding Coreg  due to bradycardia as above.  Insulin -dependent type 2 diabetes Hemoglobin A1c 6.8 on 07/21/2024.  Glucose 202.  Continue home long-acting insulin .  Placed on sensitive sliding scale insulin  every 4 hours for now as patient is on BiPAP/NPO.  CKD  stage IIIb Creatinine close to baseline, monitor labs.  Chronic anemia Hemoglobin stable.  Mood  disorder Hyperlipidemia Continue home p.o. meds when no longer on BiPAP.  Code Status: Full Code (discussed with the patient) Family Communication: Husband at bedside. Level of care: Progressive Care Unit Admission status: It is my clinical opinion that admission to INPATIENT is reasonable and necessary because of the expectation that this patient will require hospital care that crosses at least 2 midnights to treat this condition based on the medical complexity of the problems presented.  Given the aforementioned information, the predictability of an adverse outcome is felt to be significant.  Editha Ram MD [facility]  If 7PM-7AM, please contact night-coverage [email or web address]  08/10/2024, 1:38 AM       [1]  Allergies Allergen Reactions   Latex Itching and Rash   Niacin Nausea And Vomiting   "

## 2024-08-10 NOTE — Procedures (Signed)
 PROCEDURE SUMMARY:  Successful US  guided left thoracentesis. Yielded 500 ml of yellow fluid. Pt tolerated procedure well. No immediate complications.  Specimen sent for labs. CXR ordered; no post-procedure pneumothorax identified.   EBL < 2 mL  Warren JONELLE Dais, NP 08/10/2024 11:29 AM

## 2024-08-10 NOTE — ED Notes (Signed)
 CCMD called by this RN

## 2024-08-10 NOTE — Progress Notes (Signed)
 The patient is a 64 yr old woman who presented to [facility] ED on 08/09/2024 with complaints of sudden onset shortness of breath and respiratory distress. In the ED the patient was saturating 60% on room air. She required CPAP and then BIPAP to saturate at 100%.   The patient carries a medical history significant for stage III non-small cell lung cancer status post chemotherapy and currently on active immunotherapy (Imfinzi ), history of PE on Eliquis , CAD, chronic HFpEF, AV block, sick sinus syndrome, COPD, chronic bronchitis, hypertension, hyperlipidemia, insulin -dependent type 2 diabetes, history of CVA with residual right hemiparesis/dysphagia/dysarthria, CKD stage IIIb.   In the ED the patient was found to be afebrile. EKG demonstrated prolonged QTc. Hgb was 10.4, proBNP of 2561. CXR demonstrated a moderate-sized left pleural effusion, pulmonary edema and progressive airspace disease at the left base (atelectasis vs pneumonia). The patient was given tylenol , albuterol , duonebs, solumedrol, cefepime , vancomycin , and IV magnesium .   The patient was admitted by my colleague Dr. Alfornia earlier this am.   The patient went to IR and had 500 cc of fluid removed from her left lung. Chemistry, cytology, cultures, and cell count were ordered.   The patient is awake, alert, and oriented x 3. She states that she is feeling better after fluid was removed, although she is still requiring 2L O2 ( no O2 at home). Heart sounds are within normal limits. Lung sounds are diminished on the left base. No wheezes or rhonchi. No rales. Abdomen is soft, non-tender, non-distended. Extremities are negative for cyanosis, clubbing or edema.  Plan is for procalcitonin. Await results of thoracentesis.

## 2024-08-10 NOTE — Progress Notes (Signed)
 New patient admitted from the ed. MP shows sb. Patient is a/o x4. Mae's x3. Patient had a old cva. Right upper arm paralysis. Right leg she can move slightly. No deficits on the left. Patient denies complaints. Family at bedside. Skin good. Patient oriented to room bed call bell and belongings and fall policy.

## 2024-08-11 DIAGNOSIS — I5033 Acute on chronic diastolic (congestive) heart failure: Secondary | ICD-10-CM | POA: Diagnosis not present

## 2024-08-11 LAB — PROTEIN, BODY FLUID (OTHER): Total Protein, Body Fluid Other: 3.9 g/dL

## 2024-08-11 LAB — GLUCOSE, CAPILLARY
Glucose-Capillary: 119 mg/dL — ABNORMAL HIGH (ref 70–99)
Glucose-Capillary: 135 mg/dL — ABNORMAL HIGH (ref 70–99)
Glucose-Capillary: 194 mg/dL — ABNORMAL HIGH (ref 70–99)
Glucose-Capillary: 207 mg/dL — ABNORMAL HIGH (ref 70–99)
Glucose-Capillary: 282 mg/dL — ABNORMAL HIGH (ref 70–99)
Glucose-Capillary: 316 mg/dL — ABNORMAL HIGH (ref 70–99)

## 2024-08-11 LAB — BASIC METABOLIC PANEL WITH GFR
Anion gap: 9 (ref 5–15)
BUN: 32 mg/dL — ABNORMAL HIGH (ref 8–23)
CO2: 27 mmol/L (ref 22–32)
Calcium: 9.1 mg/dL (ref 8.9–10.3)
Chloride: 104 mmol/L (ref 98–111)
Creatinine, Ser: 1.56 mg/dL — ABNORMAL HIGH (ref 0.44–1.00)
GFR, Estimated: 37 mL/min — ABNORMAL LOW
Glucose, Bld: 129 mg/dL — ABNORMAL HIGH (ref 70–99)
Potassium: 4.9 mmol/L (ref 3.5–5.1)
Sodium: 140 mmol/L (ref 135–145)

## 2024-08-11 LAB — CBC WITH DIFFERENTIAL/PLATELET
Abs Immature Granulocytes: 0.06 K/uL (ref 0.00–0.07)
Basophils Absolute: 0 K/uL (ref 0.0–0.1)
Basophils Relative: 0 %
Eosinophils Absolute: 0 K/uL (ref 0.0–0.5)
Eosinophils Relative: 0 %
HCT: 29.2 % — ABNORMAL LOW (ref 36.0–46.0)
Hemoglobin: 9.1 g/dL — ABNORMAL LOW (ref 12.0–15.0)
Immature Granulocytes: 1 %
Lymphocytes Relative: 3 %
Lymphs Abs: 0.4 K/uL — ABNORMAL LOW (ref 0.7–4.0)
MCH: 27.8 pg (ref 26.0–34.0)
MCHC: 31.2 g/dL (ref 30.0–36.0)
MCV: 89.3 fL (ref 80.0–100.0)
Monocytes Absolute: 0.7 K/uL (ref 0.1–1.0)
Monocytes Relative: 5 %
Neutro Abs: 11 K/uL — ABNORMAL HIGH (ref 1.7–7.7)
Neutrophils Relative %: 91 %
Platelets: 211 K/uL (ref 150–400)
RBC: 3.27 MIL/uL — ABNORMAL LOW (ref 3.87–5.11)
RDW: 16.6 % — ABNORMAL HIGH (ref 11.5–15.5)
WBC: 12.1 K/uL — ABNORMAL HIGH (ref 4.0–10.5)
nRBC: 0 % (ref 0.0–0.2)

## 2024-08-11 LAB — GLUCOSE, BODY FLUID OTHER: Glucose, Body Fluid Other: 217 mg/dL

## 2024-08-11 LAB — ALBUMIN, FLUID (OTHER): Albumin, Body Fluid Other: 2.2 g/dL

## 2024-08-11 LAB — TSH: TSH: 0.372 u[IU]/mL (ref 0.350–4.500)

## 2024-08-11 LAB — CYTOLOGY - NON PAP

## 2024-08-11 LAB — LD, BODY FLUID (OTHER): LD, Body Fluid: 172 IU/L

## 2024-08-11 NOTE — Plan of Care (Signed)

## 2024-08-11 NOTE — Plan of Care (Signed)
   Problem: Nutritional: Goal: Maintenance of adequate nutrition will improve Outcome: Progressing   Problem: Education: Goal: Knowledge of General Education information will improve Description: Including pain rating scale, medication(s)/side effects and non-pharmacologic comfort measures Outcome: Progressing

## 2024-08-11 NOTE — Progress Notes (Signed)
 " Progress Note   Patient: Kelly Gibson FMW:[id number] DOB: November 18, 1960 DOA: 08/09/2024     1 DOS: the patient was seen and examined on 08/11/2024   Brief hospital course: The patient is a 64 yr old woman who presented to [facility] ED on 08/09/2024 with complaints of sudden onset shortness of breath and respiratory distress. In the ED the patient was saturating 60% on room air. She required CPAP and then BIPAP to saturate at 100%.    The patient carries a medical history significant for stage III non-small cell lung cancer status post chemotherapy and currently on active immunotherapy (Imfinzi ), history of PE on Eliquis , CAD, chronic HFpEF, AV block, sick sinus syndrome, COPD, chronic bronchitis, hypertension, hyperlipidemia, insulin -dependent type 2 diabetes, history of CVA with residual right hemiparesis/dysphagia/dysarthria, CKD stage IIIb.    In the ED the patient was found to be afebrile. EKG demonstrated prolonged QTc. Hgb was 10.4, proBNP of 2561. CXR demonstrated a moderate-sized left pleural effusion, pulmonary edema and progressive airspace disease at the left base (atelectasis vs pneumonia). The patient was given tylenol , albuterol , duonebs, solumedrol, cefepime , vancomycin , and IV magnesium .   The patient went to IR and had 500 cc removed from her left lung on 08/11/2024. Pathology is pending. Cultures have had no growth and gram stain is negative. No chemistries were obtained.   She is feeling much better and her oxygen requirements are declining. The patient's heart rate is 47 at the time of my visit. She is asymptomatic.   Assessment and Plan: Acute on chronic HFpEF proBNP 2561.  Chest x-ray showing increasing moderate-sized left pleural effusion and pulmonary edema.  Last echo done in June 2025 showing EF 60 to 65%, grade 2 diastolic dysfunction, moderate left atrial dilation, and mild mitral regurgitation.  Patient is currently taking Lasix  20 mg twice daily at home.  Most recent SBP 130  and MAP 77.  IV Lasix  40 mg x 1 ordered.  Reassess in AM and order additional doses of Lasix  accordingly.  Monitor intake and output, daily weights.  Echocardiogram was obtained and demonstrated EF of 55- 60% There were left ventricular regional wall motion abnormalities. There is Grade 1 diastolic dysfunction.Her oxygen requirements are down. She is only requiring room air. As creatinine is increasing due to blood loss and diuresis, will decrease lasix  to 20 mg daily by mouth. Will recheck BNP.    ?Pneumonia Chest x-ray showing progressive airspace disease at the left lung base which may be due to atelectasis versus pneumonia.  COVID/influenza/RSV PCR negative. ?Bacterial pneumonia given no fever or leukocytosis.  Patient was given vancomycin  and cefepime  in the ED.  Procalcitonin level is negative. Will stop antibiotics.   Left pleural effusion ?Malignant given history of lung cancer versus related to heart failure versus parapneumonic.  Patient is on Eliquis  which has been held. 500 cc obtained.  Gram stain and cultures are negative. No chemistry was obtained. Pathology is pending.  Acute COPD exacerbation Work of breathing has improved with BiPAP but continues to have wheezing.  Continue treatment with Solu-Medrol  40 mg every 12 hours, DuoNeb every 6 hours, Pulmicort  neb twice daily, and albuterol  neb every 2 hours PRN.   Acute hypoxemic respiratory failure Oxygen saturation 60% on room air with EMS.  Currently saturating well on BiPAP and work of breathing has improved.  VBG without evidence of hypercapnia.  Continue BiPAP, wean as tolerated.  Continuous pulse ox.   Chest pain and elevated troponin History of CAD Left heart cath done  in June 2025 showing 3 distinct occlusions of mid LAD, proximal OM 2, mid RCA with faint collateralization.  Cardiology had felt that there were no good interventional options and patient had previously declined CABG.  Troponin 31> 39.  No longer having active chest  pain at this time.  Suspect troponin elevation is due to demand ischemia in the setting of problems listed above.  Continue to trend troponin.   Stage III non-small cell lung cancer Finished chemotherapy in September 2025 and currently on immunotherapy with Imfinzi  every 4 weeks.  Outpatient oncology follow-up.   History of PE Holding Eliquis  in anticipation of thoracentesis as above. Will restart   History of sick sinus syndrome Patient is on Coreg  at home and heart rate currently in the 40s.  Not hypotensive.  Hold Coreg  and continue cardiac monitoring.   Hypertension Giving IV Lasix  as above.  Resume home antihypertensives in the morning if blood pressure remains stable and when no longer NPO.  Holding Coreg  due to bradycardia as above.   Insulin -dependent type 2 diabetes Hemoglobin A1c 6.8 on 07/21/2024.  Glucoses have been 119 to 207 in the last 24 hours.  The patients glucoses are controlled on Lantus  10 units q pm and 22 units q pm.    CKD stage IIIb Creatinine close to baseline, monitor labs.   Chronic anemia Hemoglobin stable.   Mood disorder Hyperlipidemia Continue home p.o. meds when no longer on BiPAP.   Code Status: Full Code (discussed with the patient) Family Communication: Husband at bedside.       Subjective: The patient is resting comfortably. No new complaints.   Physical Exam: Vitals:   08/11/24 0347 08/11/24 0352 08/11/24 0729 08/11/24 1103  BP:  (!) 124/55 (!) 128/58 138/61  Pulse:  (!) 47 (!) 45 (!) 48  Resp:  20 20 18   Temp:  98 F (36.7 C) 97.7 F (36.5 C) 98 F (36.7 C)  TempSrc:  Oral Oral Oral  SpO2:  99% 98% 97%  Weight: 82.3 kg     Height:       Exam:  Constitutional:  The patient is awake, alert, and oriented x 3. No acute distress. Respiratory:  No increased work of breathing. No wheezes, rales, or rhonchi No tactile fremitus Cardiovascular:  Regular rate and rhythm No murmurs, ectopy, or gallups. No lateral PMI. No  thrills. Abdomen:  Abdomen is soft, non-tender, non-distended No hernias, masses, or organomegaly Normoactive bowel sounds.  Musculoskeletal:  No cyanosis, clubbing, or edema Skin:  No rashes, lesions, ulcers palpation of skin: no induration or nodules Neurologic:  CN 2-12 intact Sensation all 4 extremities intact Psychiatric:  Mental status Mood, affect appropriate Orientation to person, place, time  judgment and insight appear intact  Data Reviewed:  CBC, BMP, Echocardiogram  Family Communication: None available  Disposition: Status is: Inpatient Remains inpatient appropriate because: Need to monitor cultures and adjust diuretics.   Planned Discharge Destination: Home    Time spent: 42 minutes  Author: Akiko Schexnider, DO 08/11/2024 5:18 PM  For on call review [email or web address].  "

## 2024-08-12 ENCOUNTER — Inpatient Hospital Stay (HOSPITAL_COMMUNITY)

## 2024-08-12 DIAGNOSIS — I5033 Acute on chronic diastolic (congestive) heart failure: Secondary | ICD-10-CM | POA: Diagnosis not present

## 2024-08-12 LAB — CBC WITH DIFFERENTIAL/PLATELET
Abs Immature Granulocytes: 0.08 K/uL — ABNORMAL HIGH (ref 0.00–0.07)
Basophils Absolute: 0 K/uL (ref 0.0–0.1)
Basophils Relative: 0 %
Eosinophils Absolute: 0 K/uL (ref 0.0–0.5)
Eosinophils Relative: 0 %
HCT: 29.9 % — ABNORMAL LOW (ref 36.0–46.0)
Hemoglobin: 9.3 g/dL — ABNORMAL LOW (ref 12.0–15.0)
Immature Granulocytes: 1 %
Lymphocytes Relative: 4 %
Lymphs Abs: 0.5 K/uL — ABNORMAL LOW (ref 0.7–4.0)
MCH: 27.9 pg (ref 26.0–34.0)
MCHC: 31.1 g/dL (ref 30.0–36.0)
MCV: 89.8 fL (ref 80.0–100.0)
Monocytes Absolute: 0.9 K/uL (ref 0.1–1.0)
Monocytes Relative: 7 %
Neutro Abs: 11.3 K/uL — ABNORMAL HIGH (ref 1.7–7.7)
Neutrophils Relative %: 88 %
Platelets: 219 K/uL (ref 150–400)
RBC: 3.33 MIL/uL — ABNORMAL LOW (ref 3.87–5.11)
RDW: 16.8 % — ABNORMAL HIGH (ref 11.5–15.5)
WBC: 12.8 K/uL — ABNORMAL HIGH (ref 4.0–10.5)
nRBC: 0 % (ref 0.0–0.2)

## 2024-08-12 LAB — GLUCOSE, CAPILLARY
Glucose-Capillary: 148 mg/dL — ABNORMAL HIGH (ref 70–99)
Glucose-Capillary: 85 mg/dL (ref 70–99)
Glucose-Capillary: 118 mg/dL — ABNORMAL HIGH (ref 70–99)
Glucose-Capillary: 231 mg/dL — ABNORMAL HIGH (ref 70–99)
Glucose-Capillary: 274 mg/dL — ABNORMAL HIGH (ref 70–99)

## 2024-08-12 LAB — BASIC METABOLIC PANEL WITH GFR
Anion gap: 9 (ref 5–15)
BUN: 37 mg/dL — ABNORMAL HIGH (ref 8–23)
CO2: 26 mmol/L (ref 22–32)
Calcium: 9.2 mg/dL (ref 8.9–10.3)
Chloride: 104 mmol/L (ref 98–111)
Creatinine, Ser: 1.37 mg/dL — ABNORMAL HIGH (ref 0.44–1.00)
GFR, Estimated: 43 mL/min — ABNORMAL LOW
Glucose, Bld: 172 mg/dL — ABNORMAL HIGH (ref 70–99)
Potassium: 4.7 mmol/L (ref 3.5–5.1)
Sodium: 138 mmol/L (ref 135–145)

## 2024-08-12 MED ORDER — PREDNISONE 20 MG PO TABS
60.0000 mg | ORAL_TABLET | Freq: Every day | ORAL | Status: DC
  Administered 2024-08-13 – 2024-08-15 (×3): 60 mg via ORAL
  Filled 2024-08-12 (×3): qty 3

## 2024-08-12 MED ORDER — BUDESON-GLYCOPYRROL-FORMOTEROL 160-9-4.8 MCG/ACT IN AERO
2.0000 | INHALATION_SPRAY | Freq: Two times a day (BID) | RESPIRATORY_TRACT | Status: DC
  Administered 2024-08-13 – 2024-08-15 (×4): 2 via RESPIRATORY_TRACT
  Filled 2024-08-12: qty 5.9

## 2024-08-12 MED ORDER — FUROSEMIDE 10 MG/ML IJ SOLN
20.0000 mg | Freq: Two times a day (BID) | INTRAMUSCULAR | Status: DC
  Administered 2024-08-12 – 2024-08-14 (×4): 20 mg via INTRAVENOUS
  Filled 2024-08-12 (×4): qty 2

## 2024-08-12 MED ORDER — MELATONIN 5 MG PO CHEW
10.0000 mg | CHEWABLE_TABLET | Freq: Every day | ORAL | Status: DC
  Filled 2024-08-12 (×2): qty 2

## 2024-08-12 MED ORDER — MELATONIN 5 MG PO TABS
10.0000 mg | ORAL_TABLET | Freq: Every day | ORAL | Status: DC
  Administered 2024-08-12 – 2024-08-14 (×3): 10 mg via ORAL
  Filled 2024-08-12 (×3): qty 2

## 2024-08-12 MED ORDER — APIXABAN 5 MG PO TABS
5.0000 mg | ORAL_TABLET | Freq: Two times a day (BID) | ORAL | Status: DC
  Administered 2024-08-12 – 2024-08-15 (×6): 5 mg via ORAL
  Filled 2024-08-12 (×6): qty 1

## 2024-08-12 NOTE — Progress Notes (Signed)
 " Progress Note   Patient: Kelly Gibson FMW:[id number] DOB: Mar 26, 1961 DOA: 08/09/2024     2 DOS: the patient was seen and examined on 08/12/2024   Brief hospital course: The patient is a 64 yr old woman who presented to [facility] ED on 08/09/2024 with complaints of sudden onset shortness of breath and respiratory distress. In the ED the patient was saturating 60% on room air. She required CPAP and then BIPAP to saturate at 100%.    The patient carries a medical history significant for stage III non-small cell lung cancer status post chemotherapy and currently on active immunotherapy (Imfinzi ), history of PE on Eliquis , CAD, chronic HFpEF, AV block, sick sinus syndrome, COPD, chronic bronchitis, hypertension, hyperlipidemia, insulin -dependent type 2 diabetes, history of CVA with residual right hemiparesis/dysphagia/dysarthria, CKD stage IIIb.    In the ED the patient was found to be afebrile. EKG demonstrated prolonged QTc. Hgb was 10.4, proBNP of 2561. CXR demonstrated a moderate-sized left pleural effusion, pulmonary edema and progressive airspace disease at the left base (atelectasis vs pneumonia). The patient was given tylenol , albuterol , duonebs, solumedrol, cefepime , vancomycin , and IV magnesium .   The patient went to IR and had 500 cc removed from her left lung on 08/11/2024. Pathology is pending. Cultures have had no growth and gram stain is negative. No chemistries were obtained.   She is feeling much better and her oxygen requirements are declining. The patient's heart rate is 47 at the time of my visit. She is asymptomatic. Steroids are being tapered down.  On 08/12/2024 the patient was complaining of increased shortness of breath. She had diminished breath sounds on the left. CXR was obtained. It demonstrates a persistent left pleural effusion, retrocardiac consolidation/atelectasis (persistent). There has been an increase in mild interstitial edema. Lasix  was started.   Assessment and  Plan: Acute on chronic HFpEF proBNP 2561.  Chest x-ray showing increasing moderate-sized left pleural effusion and pulmonary edema.  Last echo done in June 2025 showing EF 60 to 65%, grade 2 diastolic dysfunction, moderate left atrial dilation, and mild mitral regurgitation.  Patient is currently taking Lasix  20 mg twice daily at home.  Most recent SBP 130 and MAP 77.  IV Lasix  40 mg x 1 ordered.  Reassess in AM and order additional doses of Lasix  accordingly.  Monitor intake and output, daily weights.  Echocardiogram was obtained and demonstrated EF of 55- 60% There were left ventricular regional wall motion abnormalities. There is Grade 1 diastolic dysfunction.Her oxygen requirements are down. On 08/12/2024 the patient is requiring 1 L O2 to maintain saturations greater than 90%.  Creatinine did improve with change to once daily  Will recheck BNP.   AKI The patient's baseline creatinine appears to be about 1.3. Upon admission it was 1.5. As diuresis was begun, her creatinine has climbed. It was 1.72 on 08/11/2024. Her diuresis was de-escalated to 20 mg PO daily from 20 twice bid. On 08/12/2024 it came down to 1.37. However, she also began to require oxygen which she has not required since her thoracentesis. CXR demonstrated persistence of the pleural effusion and an increase in her pulmonary edema. She has been returned to the lasix  20 mg bid IV. Monitor.   ?Pneumonia Chest x-ray showing progressive airspace disease at the left lung base which may be due to atelectasis versus pneumonia.  COVID/influenza/RSV PCR negative. ?Bacterial pneumonia given no fever or leukocytosis.  Patient was given vancomycin  and cefepime  in the ED.  Procalcitonin level is negative. Will stop antibiotics. CXR  performed on 08/12/2024 demonstrated persistence of retrocardiac consolidate. Continue IV antibiotics.   Left pleural effusion ?Malignant given history of lung cancer versus related to heart failure versus parapneumonic.   Patient is on Eliquis  which has been held. 500 cc obtained.  Gram stain and cultures have had no growth.  No chemistry was obtained. Pathology is pending. CXR today demonstrates slight increase from post thoracentesis film on 08/10/2024.   Acute COPD exacerbation Work of breathing has improved with BiPAP but continues to have wheezing.  Continue treatment with Solu-Medrol  40 mg every 12 hours, DuoNeb every 6 hours, Pulmicort  neb twice daily, and albuterol  neb every 2 hours PRN. Steroids are being tapered down.   Acute hypoxemic respiratory failure Oxygen saturation 60% on room air with EMS.  Currently saturating well on BiPAP and work of breathing has improved.  VBG without evidence of hypercapnia.  The patient was saturating in the 90's on 08/11/2024. However, on 08/12/2024 the patient was complaining of increased work of breathing and was requiring 1L O2 to maintain SaO2 in the 90's.    Chest pain and elevated troponin History of CAD Left heart cath done in June 2025 showing 3 distinct occlusions of mid LAD, proximal OM 2, mid RCA with faint collateralization.  Cardiology had felt that there were no good interventional options and patient had previously declined CABG.  Troponin 31> 39.  No longer having active chest pain at this time.  Suspect troponin elevation is due to demand ischemia in the setting of problems listed above.  Continue to trend troponin.   Stage III non-small cell lung cancer Finished chemotherapy in September 2025 and currently on immunotherapy with Imfinzi  every 4 weeks.  Outpatient oncology follow-up.   History of PE Holding Eliquis  in anticipation of thoracentesis as above. Will restart.   History of sick sinus syndrome Patient is on Coreg  at home and heart rate currently in the 40s.  Not hypotensive.  Hold Coreg  and continue cardiac monitoring.   Hypertension Giving IV Lasix  as above.  Resume home antihypertensives in the morning if blood pressure remains stable and when  no longer NPO.  Holding Coreg  due to bradycardia as above.   Insulin -dependent type 2 diabetes Hemoglobin A1c 6.8 on 07/21/2024.  Glucoses have been 119 to 207 in the last 24 hours.  The patients glucoses are controlled on Lantus  10 units q pm and 22 units q pm.    CKD stage IIIb Creatinine close to baseline, monitor labs.   Chronic anemia Hemoglobin stable.   Mood disorder Hyperlipidemia Continue home p.o. meds when no longer on BiPAP.   Code Status: Full Code (discussed with the patient) Family Communication: Husband at bedside.       Subjective: The patient is resting comfortably. She is complaining of increased work of breathing.   Physical Exam: Vitals:   08/12/24 1010 08/12/24 1112 08/12/24 1546 08/12/24 1552  BP:  (!) 131/58 (!) 150/69   Pulse:  (!) 48 (!) 48   Resp:  (!) 22 (!) 27 (!) 21  Temp:  98 F (36.7 C) 97.9 F (36.6 C)   TempSrc:  Oral Oral   SpO2: 99% 100% 100%   Weight:      Height:       Exam:  Constitutional:  The patient is awake, alert, and oriented x 3. No acute distress. Respiratory:  Positive for increased work of breathing. No rales, or rhonchi Positive for wheezes Diminished breath sounds on the left base No tactile fremitus Cardiovascular:  Regular rate and rhythm No murmurs, ectopy, or gallups. No lateral PMI. No thrills. Abdomen:  Abdomen is soft, non-tender, non-distended No hernias, masses, or organomegaly Normoactive bowel sounds.  Musculoskeletal:  No cyanosis, clubbing, or edema Skin:  No rashes, lesions, ulcers palpation of skin: no induration or nodules Neurologic:  CN 2-12 intact Sensation all 4 extremities intact Psychiatric:  Mental status Mood, affect appropriate Orientation to person, place, time  judgment and insight appear intact  Data Reviewed:  CBC, BMP, Echocardiogram  Family Communication: None available  Disposition: Status is: Inpatient Remains inpatient appropriate because: Need to monitor  cultures and adjust diuretics.   Planned Discharge Destination: Home    Time spent: 40 minutes  Author: Almira Phetteplace, DO 08/12/2024 6:20 PM  For on call review [email or web address].  "

## 2024-08-12 NOTE — Plan of Care (Signed)

## 2024-08-12 NOTE — Plan of Care (Signed)
   Problem: Nutritional: Goal: Maintenance of adequate nutrition will improve Outcome: Progressing   Problem: Education: Goal: Knowledge of General Education information will improve Description: Including pain rating scale, medication(s)/side effects and non-pharmacologic comfort measures Outcome: Progressing

## 2024-08-13 DIAGNOSIS — I5033 Acute on chronic diastolic (congestive) heart failure: Secondary | ICD-10-CM | POA: Diagnosis not present

## 2024-08-13 LAB — CBC WITH DIFFERENTIAL/PLATELET
Abs Immature Granulocytes: 0.06 K/uL (ref 0.00–0.07)
Basophils Absolute: 0 K/uL (ref 0.0–0.1)
Basophils Relative: 0 %
Eosinophils Absolute: 0 K/uL (ref 0.0–0.5)
Eosinophils Relative: 0 %
HCT: 31.4 % — ABNORMAL LOW (ref 36.0–46.0)
Hemoglobin: 9.8 g/dL — ABNORMAL LOW (ref 12.0–15.0)
Immature Granulocytes: 1 %
Lymphocytes Relative: 5 %
Lymphs Abs: 0.5 K/uL — ABNORMAL LOW (ref 0.7–4.0)
MCH: 27.8 pg (ref 26.0–34.0)
MCHC: 31.2 g/dL (ref 30.0–36.0)
MCV: 89.2 fL (ref 80.0–100.0)
Monocytes Absolute: 0.8 K/uL (ref 0.1–1.0)
Monocytes Relative: 9 %
Neutro Abs: 8.2 K/uL — ABNORMAL HIGH (ref 1.7–7.7)
Neutrophils Relative %: 85 %
Platelets: 217 K/uL (ref 150–400)
RBC: 3.52 MIL/uL — ABNORMAL LOW (ref 3.87–5.11)
RDW: 16.3 % — ABNORMAL HIGH (ref 11.5–15.5)
WBC: 9.5 K/uL (ref 4.0–10.5)
nRBC: 0 % (ref 0.0–0.2)

## 2024-08-13 LAB — COMPREHENSIVE METABOLIC PANEL WITH GFR
ALT: 7 U/L (ref 0–44)
AST: 13 U/L — ABNORMAL LOW (ref 15–41)
Albumin: 3.3 g/dL — ABNORMAL LOW (ref 3.5–5.0)
Alkaline Phosphatase: 67 U/L (ref 38–126)
Anion gap: 10 (ref 5–15)
BUN: 33 mg/dL — ABNORMAL HIGH (ref 8–23)
CO2: 26 mmol/L (ref 22–32)
Calcium: 9.2 mg/dL (ref 8.9–10.3)
Chloride: 104 mmol/L (ref 98–111)
Creatinine, Ser: 1.13 mg/dL — ABNORMAL HIGH (ref 0.44–1.00)
GFR, Estimated: 55 mL/min — ABNORMAL LOW
Glucose, Bld: 139 mg/dL — ABNORMAL HIGH (ref 70–99)
Potassium: 4.4 mmol/L (ref 3.5–5.1)
Sodium: 141 mmol/L (ref 135–145)
Total Bilirubin: 0.2 mg/dL (ref 0.0–1.2)
Total Protein: 6.3 g/dL — ABNORMAL LOW (ref 6.5–8.1)

## 2024-08-13 LAB — GLUCOSE, CAPILLARY
Glucose-Capillary: 109 mg/dL — ABNORMAL HIGH (ref 70–99)
Glucose-Capillary: 141 mg/dL — ABNORMAL HIGH (ref 70–99)
Glucose-Capillary: 214 mg/dL — ABNORMAL HIGH (ref 70–99)
Glucose-Capillary: 315 mg/dL — ABNORMAL HIGH (ref 70–99)
Glucose-Capillary: 377 mg/dL — ABNORMAL HIGH (ref 70–99)
Glucose-Capillary: 84 mg/dL (ref 70–99)

## 2024-08-13 NOTE — Plan of Care (Signed)

## 2024-08-13 NOTE — Plan of Care (Signed)
" °  Problem: Coping: Goal: Ability to adjust to condition or change in health will improve Outcome: Progressing   Problem: Clinical Measurements: Goal: Respiratory complications will improve Outcome: Progressing   Problem: Coping: Goal: Level of anxiety will decrease Outcome: Progressing   Problem: Elimination: Goal: Will not experience complications related to urinary retention Outcome: Progressing   "

## 2024-08-13 NOTE — Plan of Care (Signed)
  Problem: Fluid Volume: Goal: Ability to maintain a balanced intake and output will improve Outcome: Progressing   Problem: Nutritional: Goal: Maintenance of adequate nutrition will improve Outcome: Progressing   Problem: Skin Integrity: Goal: Risk for impaired skin integrity will decrease Outcome: Progressing

## 2024-08-13 NOTE — Progress Notes (Signed)
 " Progress Note   Patient: Kelly Gibson FMW:[id number] DOB: 1961-01-31 DOA: 08/09/2024     3 DOS: the patient was seen and examined on 08/13/2024   Brief hospital course: The patient is a 64 yr old woman who presented to [facility] ED on 08/09/2024 with complaints of sudden onset shortness of breath and respiratory distress. In the ED the patient was saturating 60% on room air. She required CPAP and then BIPAP to saturate at 100%.    The patient carries a medical history significant for stage III non-small cell lung cancer status post chemotherapy and currently on active immunotherapy (Imfinzi ), history of PE on Eliquis , CAD, chronic HFpEF, AV block, sick sinus syndrome, COPD, chronic bronchitis, hypertension, hyperlipidemia, insulin -dependent type 2 diabetes, history of CVA with residual right hemiparesis/dysphagia/dysarthria, CKD stage IIIb.    In the ED the patient was found to be afebrile. EKG demonstrated prolonged QTc. Hgb was 10.4, proBNP of 2561. CXR demonstrated a moderate-sized left pleural effusion, pulmonary edema and progressive airspace disease at the left base (atelectasis vs pneumonia). The patient was given tylenol , albuterol , duonebs, solumedrol, cefepime , vancomycin , and IV magnesium .   The patient went to IR and had 500 cc removed from her left lung on 08/11/2024. Pathology is pending. Cultures have had no growth and gram stain is negative. No chemistries were obtained.   She is feeling much better and her oxygen requirements are declining. The patient's heart rate is 47 at the time of my visit. She is asymptomatic. Steroids are being tapered down.  On 08/12/2024 the patient was complaining of increased shortness of breath. She had diminished breath sounds on the left. CXR was obtained. It demonstrates a persistent left pleural effusion, retrocardiac consolidation/atelectasis (persistent). There has been an increase in mild interstitial edema. Lasix  was started. On 08/13/2024 the patient  was again saturating nicely in the 90's on room air. She states that she is feeling much better. Will continue to monitor over the next 24 hours before de-escalating the diuresis.  Assessment and Plan: Acute on chronic HFpEF proBNP 2561.  Chest x-ray showing increasing moderate-sized left pleural effusion and pulmonary edema.  Last echo done in June 2025 showing EF 60 to 65%, grade 2 diastolic dysfunction, moderate left atrial dilation, and mild mitral regurgitation.  Patient is currently taking Lasix  20 mg twice daily at home.  Most recent SBP 130 and MAP 77.  IV Lasix  40 mg x 1 ordered.  Reassess in AM and order additional doses of Lasix  accordingly.  Monitor intake and output, daily weights.  Echocardiogram was obtained and demonstrated EF of 55- 60% There were left ventricular regional wall motion abnormalities. There is Grade 1 diastolic dysfunction.Her oxygen requirements are down. On 08/12/2024 the patient is requiring 1 L O2 to maintain saturations greater than 90%.  On 08/13/2024 the patient is again saturating well on room air once lasix  was changed back to 20 mg IV bid. Continue to monitor for another 24 hours before transitioning back to oral diuresis.   AKI The patient's baseline creatinine appears to be about 1.3. Upon admission it was 1.5. As diuresis was begun, her creatinine has climbed. It was 1.72 on 08/11/2024. Her diuresis was de-escalated to 20 mg PO daily from 20 twice bid. On 08/13/2024 creatinine is down to 1.13.  despite returning to IV lasix . Continue to monitor.  Pneumonia Chest x-ray showing progressive airspace disease at the left lung base which may be due to atelectasis versus pneumonia.  COVID/influenza/RSV PCR negative. ?Bacterial pneumonia given  no fever or leukocytosis.  Patient was given vancomycin  and cefepime  in the ED.  Procalcitonin level is negative. Will stop antibiotics. CXR performed on 08/12/2024 demonstrated persistence of retrocardiac consolidate. Continue IV  antibiotics.   Left pleural effusion ?Malignant given history of lung cancer versus related to heart failure versus parapneumonic.  Patient is on Eliquis  which has been held. 500 cc obtained.  Gram stain and cultures have had no growth.  No chemistry was obtained. Pathology is pending. CXR today demonstrates slight increase from post thoracentesis film on 08/10/2024.   Acute COPD exacerbation Work of breathing has improved with BiPAP but continues to have wheezing.  Continue treatment with Solu-Medrol  40 mg every 12 hours, DuoNeb every 6 hours, Pulmicort  neb twice daily, and albuterol  neb every 2 hours PRN. Steroids are being tapered down.   Acute hypoxemic respiratory failure Oxygen saturation 60% on room air with EMS.  Currently saturating well on BiPAP and work of breathing has improved.  VBG without evidence of hypercapnia.  The patient was saturating in the 90's on 08/11/2024. However, on 08/12/2024 the patient was complaining of increased work of breathing and was requiring 1L O2 to maintain SaO2 in the 90's. After putting back on IV lasix  she is requiring only room air to maintain SaO2 greater than 90%.    Chest pain and elevated troponin History of CAD Left heart cath done in June 2025 showing 3 distinct occlusions of mid LAD, proximal OM 2, mid RCA with faint collateralization.  Cardiology had felt that there were no good interventional options and patient had previously declined CABG.  Troponin 31> 39.  No longer having active chest pain at this time.  Suspect troponin elevation is due to demand ischemia in the setting of problems listed above.  Continue to trend troponin.   Stage III non-small cell lung cancer Finished chemotherapy in September 2025 and currently on immunotherapy with Imfinzi  every 4 weeks.  Outpatient oncology follow-up.   History of PE Patient is receiving eliquis .   History of sick sinus syndrome Patient is on Coreg  at home and heart rate currently in the 40s.  Not  hypotensive.  Hold Coreg  and continue cardiac monitoring.   Hypertension Giving IV Lasix  as above.  Resume home antihypertensives in the morning if blood pressure remains stable and when no longer NPO.  Holding Coreg  due to bradycardia as above.   Insulin -dependent type 2 diabetes Hemoglobin A1c 6.8 on 07/21/2024.  Glucoses have been 119 to 207 in the last 24 hours.  The patients glucoses are controlled on Lantus  10 units q pm and 22 units q pm.    CKD stage IIIb Creatinine close to baseline, monitor labs.   Chronic anemia Hemoglobin stable.   Mood disorder Hyperlipidemia Continue home p.o. meds when no longer on BiPAP.   Code Status: Full Code (discussed with the patient) Family Communication: Husband at bedside.       Subjective: The patient is sitting up at bedside. She states that she is feeling much better.  Physical Exam: Vitals:   08/13/24 0719 08/13/24 0920 08/13/24 1119 08/13/24 1438  BP: (!) 157/68  (!) 143/66   Pulse: (!) 44 (!) 46 (!) 48   Resp: 20 20 19    Temp: 97.8 F (36.6 C)  98 F (36.7 C)   TempSrc: Oral  Oral   SpO2: 99% 100% 100% 99%  Weight:      Height:       Exam:  Constitutional:  The patient is awake, alert,  and oriented x 3. No acute distress. Respiratory:  Positive for increased work of breathing. No rales, wheezes, or rhonchi Diminished breath sounds on the left base No tactile fremitus Cardiovascular:  Regular rate and rhythm No murmurs, ectopy, or gallups. No lateral PMI. No thrills. Abdomen:  Abdomen is soft, non-tender, non-distended No hernias, masses, or organomegaly Normoactive bowel sounds.  Musculoskeletal:  No cyanosis, clubbing, or edema Skin:  No rashes, lesions, ulcers palpation of skin: no induration or nodules Neurologic:  CN 2-12 intact Sensation all 4 extremities intact Psychiatric:  Mental status Mood, affect appropriate Orientation to person, place, time  judgment and insight appear intact  Data  Reviewed:  CBC, BMP, CXR  Family Communication: None available  Disposition: Status is: Inpatient Remains inpatient appropriate because: Need to monitor cultures and adjust diuretics.   Planned Discharge Destination: Home    Time spent: 40 minutes  Author: Margurite Duffy, DO 08/13/2024 3:28 PM  For on call review [email or web address].  "

## 2024-08-14 ENCOUNTER — Other Ambulatory Visit: Payer: Self-pay | Admitting: Physician Assistant

## 2024-08-14 DIAGNOSIS — C3432 Malignant neoplasm of lower lobe, left bronchus or lung: Secondary | ICD-10-CM

## 2024-08-14 LAB — CBC WITH DIFFERENTIAL/PLATELET
Abs Immature Granulocytes: 0.04 K/uL (ref 0.00–0.07)
Basophils Absolute: 0 K/uL (ref 0.0–0.1)
Basophils Relative: 0 %
Eosinophils Absolute: 0 K/uL (ref 0.0–0.5)
Eosinophils Relative: 0 %
HCT: 33.7 % — ABNORMAL LOW (ref 36.0–46.0)
Hemoglobin: 10.9 g/dL — ABNORMAL LOW (ref 12.0–15.0)
Immature Granulocytes: 1 %
Lymphocytes Relative: 6 %
Lymphs Abs: 0.5 K/uL — ABNORMAL LOW (ref 0.7–4.0)
MCH: 27.9 pg (ref 26.0–34.0)
MCHC: 32.3 g/dL (ref 30.0–36.0)
MCV: 86.4 fL (ref 80.0–100.0)
Monocytes Absolute: 1 K/uL (ref 0.1–1.0)
Monocytes Relative: 11 %
Neutro Abs: 7.2 K/uL (ref 1.7–7.7)
Neutrophils Relative %: 82 %
Platelets: 227 K/uL (ref 150–400)
RBC: 3.9 MIL/uL (ref 3.87–5.11)
RDW: 16.3 % — ABNORMAL HIGH (ref 11.5–15.5)
WBC: 8.7 K/uL (ref 4.0–10.5)
nRBC: 0 % (ref 0.0–0.2)

## 2024-08-14 LAB — GLUCOSE, CAPILLARY
Glucose-Capillary: 105 mg/dL — ABNORMAL HIGH (ref 70–99)
Glucose-Capillary: 263 mg/dL — ABNORMAL HIGH (ref 70–99)
Glucose-Capillary: 55 mg/dL — ABNORMAL LOW (ref 70–99)
Glucose-Capillary: 87 mg/dL (ref 70–99)
Glucose-Capillary: 182 mg/dL — ABNORMAL HIGH (ref 70–99)
Glucose-Capillary: 297 mg/dL — ABNORMAL HIGH (ref 70–99)
Glucose-Capillary: 399 mg/dL — ABNORMAL HIGH (ref 70–99)

## 2024-08-14 LAB — BASIC METABOLIC PANEL WITH GFR
Anion gap: 10 (ref 5–15)
BUN: 34 mg/dL — ABNORMAL HIGH (ref 8–23)
CO2: 30 mmol/L (ref 22–32)
Calcium: 9.7 mg/dL (ref 8.9–10.3)
Chloride: 99 mmol/L (ref 98–111)
Creatinine, Ser: 1.06 mg/dL — ABNORMAL HIGH (ref 0.44–1.00)
GFR, Estimated: 59 mL/min — ABNORMAL LOW
Glucose, Bld: 146 mg/dL — ABNORMAL HIGH (ref 70–99)
Potassium: 3.7 mmol/L (ref 3.5–5.1)
Sodium: 139 mmol/L (ref 135–145)

## 2024-08-14 LAB — MAGNESIUM: Magnesium: 2.1 mg/dL (ref 1.7–2.4)

## 2024-08-14 MED ORDER — ALBUTEROL SULFATE (2.5 MG/3ML) 0.083% IN NEBU: 2.5000 mg | INHALATION_SOLUTION | Freq: Four times a day (QID) | RESPIRATORY_TRACT | Status: DC | PRN

## 2024-08-14 MED ORDER — FUROSEMIDE 40 MG PO TABS
40.0000 mg | ORAL_TABLET | Freq: Two times a day (BID) | ORAL | Status: DC
  Administered 2024-08-14 – 2024-08-15 (×2): 40 mg via ORAL
  Filled 2024-08-14 (×2): qty 1

## 2024-08-14 MED ORDER — FUROSEMIDE 40 MG PO TABS: 40.0000 mg | ORAL_TABLET | Freq: Every day | ORAL | Status: DC

## 2024-08-14 NOTE — Progress Notes (Signed)
 " PROGRESS NOTE    Kelly Gibson  FMW:[id number] DOB: 28-Oct-1960 DOA: 08/09/2024 PCP: Oris [name], NP  Subjective: Patient reports feeling better today, has intermittent shortness of breath but at this time comfortable    Hospital Course: 64 year old female with PMH of stage III non-small cell lung cancer (s/p chemo, currently on immunotherapy), PE (on Eliquis ), CAD, HFpEF, AV block, sick sinus syndrome, COPD, HTN, HLD, DM2, CVA with residual right-sided hemiparesis, CKD stage III who presented to the ED with sudden onset shortness of breath, and was found to be hypoxic on arrival.  Chest x-ray showed moderate-sized left-sided pleural effusion, pulmonary edema and she was started on BiPAP.  She underwent thoracentesis with IR with removal of 500 mL, cultures with no growth, cytology pending.  She has not been weaned off of oxygen, saturating well on room air.  Repeat chest x-ray showed persistent left-sided pleural effusion   Assessment and Plan:  Acute on chronic HFpEF proBNP 2561.  Chest x-ray showing increasing moderate-sized left pleural effusion and pulmonary edema.  Last echo done in June 2025 showing EF 60 to 65%, grade 2 diastolic dysfunction, moderate left atrial dilation, and mild mitral regurgitation.  Repeat Echocardiogram was obtained and demonstrated EF of 55- 60% There were left ventricular regional wall motion abnormalities. There is Grade 1 diastolic dysfunction.Her oxygen requirements are down.  - saturating well on room air (98-100%) at rest. Not mobile due to her right sided hemiparesis - IV lasix  changed to PO lasix , may need a higher dose than her previous home dose  - monitor for any respiratory distress or need for oxygen   AKI The patient's baseline creatinine appears to be about 1.3. Upon admission it was 1.5. As diuresis was begun, her creatinine has climbed. It was 1.72 on 08/11/2024 - Cr back to baseline now    Pneumonia Chest x-ray showing progressive  airspace disease at the left lung base which may be due to atelectasis versus pneumonia.  COVID/influenza/RSV PCR negative. ?Bacterial pneumonia given no fever or leukocytosis.  Patient was given vancomycin  and cefepime  in the ED.  Procalcitonin level was negative and Abx stopped    Left pleural effusion ?Malignant given history of lung cancer versus related to heart failure versus parapneumonic.  Patient is on Eliquis  which has been held. 500 cc obtained.  Gram stain and cultures have had no growth.  No chemistry was obtained.  - cytology negative for any malignant cells    Acute COPD exacerbation Work of breathing has improved with BiPAP - on prednisone  60 mg x5 days - continue nebs    Acute hypoxemic respiratory failure Oxygen saturation 60% on room air with EMS.  Work of breathing improved with BiPAP.  VBG without evidence of hypercapnia.   - on room air now     Chest pain and elevated troponin History of CAD Left heart cath done in June 2025 showing 3 distinct occlusions of mid LAD, proximal OM 2, mid RCA with faint collateralization.  Cardiology had felt that there were no good interventional options and patient had previously declined CABG.  Troponin 31> 39.  No longer having active chest pain at this time.  Suspect troponin elevation is due to demand ischemia in the setting of problems listed above.   Stage III non-small cell lung cancer Finished chemotherapy in September 2025 and currently on immunotherapy with Imfinzi  every 4 weeks.  Outpatient oncology follow-up.   History of PE Patient is receiving eliquis .   History of sick sinus  syndrome Patient is on Coreg  at home and heart rate currently in the 40s.  Not hypotensive.  Hold Coreg  and continue cardiac monitoring.   Hypertension Holding Coreg  due to bradycardia as above. Continue lasix  BP mostly stable, continue to monitor    Insulin -dependent type 2 diabetes Hemoglobin A1c 6.8 on 07/21/2024.  Glucoses have been 119 to  207 in the last 24 hours.  The patients glucoses are controlled on Lantus  10 units q pm and 22 units q pm.    CKD stage IIIb Creatinine close to baseline, monitor labs.   Chronic anemia Hemoglobin stable.   Mood disorder Hyperlipidemia Continue home p.o. meds    DVT prophylaxis:  apixaban  (ELIQUIS ) tablet 5 mg    Code Status: Full Code Family Communication: Updated at bedside  Disposition Plan: Home  Objective: Vitals:   08/14/24 0353 08/14/24 0358 08/14/24 0732 08/14/24 1200  BP: (!) 157/80  (!) 162/81 132/78  Pulse: (!) 47  (!) 52 85  Resp: 18  (!) 24 17  Temp: 97.8 F (36.6 C)  (!) 97 F (36.1 C)   TempSrc: Oral  Oral   SpO2: 100%  99% 100%  Weight:  82.1 kg    Height:        Intake/Output Summary (Last 24 hours) at 08/14/2024 1427 Last data filed at 08/14/2024 1300 Gross per 24 hour  Intake 458 ml  Output 2600 ml  Net -2142 ml   Filed Weights   08/12/24 0415 08/13/24 0401 08/14/24 0358  Weight: 82.8 kg 82 kg 82.1 kg    Examination:  Physical Exam Vitals and nursing note reviewed.  Constitutional:      General: She is not in acute distress. Cardiovascular:     Rate and Rhythm: Normal rate and regular rhythm.  Pulmonary:     Effort: No respiratory distress.     Breath sounds: No wheezing.  Abdominal:     General: There is no distension.     Tenderness: There is no abdominal tenderness.  Musculoskeletal:     Right lower leg: No edema.     Left lower leg: No edema.  Neurological:     Comments: Right sided hemiparesis, contracture of the right upper extremity     Data Reviewed: I have personally reviewed following labs and imaging studies  CBC: Recent Labs  Lab 08/09/24 2240 08/09/24 2304 08/11/24 0333 08/12/24 0225 08/13/24 0254 08/14/24 0252  WBC 8.4  --  12.1* 12.8* 9.5 8.7  NEUTROABS 6.8  --  11.0* 11.3* 8.2* 7.2  HGB 10.4* 11.2* 9.1* 9.3* 9.8* 10.9*  HCT 33.5* 33.0* 29.2* 29.9* 31.4* 33.7*  MCV 89.8  --  89.3 89.8 89.2 86.4  PLT  246  --  211 219 217 227   Basic Metabolic Panel: Recent Labs  Lab 08/10/24 0522 08/11/24 0333 08/12/24 0225 08/13/24 0254 08/14/24 0252  NA 138 140 138 141 139  K 5.0 4.9 4.7 4.4 3.7  CL 103 104 104 104 99  CO2 24 27 26 26 30   GLUCOSE 226* 129* 172* 139* 146*  BUN 25* 32* 37* 33* 34*  CREATININE 1.72* 1.56* 1.37* 1.13* 1.06*  CALCIUM  9.1 9.1 9.2 9.2 9.7  MG  --   --   --   --  2.1   GFR: Estimated Creatinine Clearance: 55.9 mL/min (A) (by C-G formula based on SCr of 1.06 mg/dL (H)). Liver Function Tests: Recent Labs  Lab 08/09/24 2240 08/13/24 0254  AST 22 13*  ALT 11 7  ALKPHOS 87  67  BILITOT 0.3 0.2  PROT 6.9 6.3*  ALBUMIN 3.5 3.3*   No results for input(s): LIPASE, AMYLASE in the last 168 hours. No results for input(s): AMMONIA in the last 168 hours. Coagulation Profile: No results for input(s): INR, PROTIME in the last 168 hours. Cardiac Enzymes: No results for input(s): CKTOTAL, CKMB, CKMBINDEX, TROPONINI in the last 168 hours. ProBNP, BNP (last 5 results) Recent Labs    12/30/23 1400 01/12/24 0748 07/21/24 0756 07/22/24 0634 08/09/24 2240  PROBNP  --   --  3,463.0* 7,398.0* 2,561.0*  BNP 691.1* 667.3*  --   --   --    HbA1C: No results for input(s): HGBA1C in the last 72 hours. CBG: Recent Labs  Lab 08/13/24 2353 08/14/24 0355 08/14/24 0719 08/14/24 0738 08/14/24 1117  GLUCAP 263* 105* 55* 87 182*   Lipid Profile: No results for input(s): CHOL, HDL, LDLCALC, TRIG, CHOLHDL, LDLDIRECT in the last 72 hours. Thyroid  Function Tests: No results for input(s): TSH, T4TOTAL, FREET4, T3FREE, THYROIDAB in the last 72 hours. Anemia Panel: No results for input(s): VITAMINB12, FOLATE, FERRITIN, TIBC, IRON, RETICCTPCT in the last 72 hours. Sepsis Labs: Recent Labs  Lab 08/10/24 0522  PROCALCITON 0.23    Recent Results (from the past 240 hours)  Fecal occult blood, imunochemical     Status: None    Collection Time: 08/08/24 12:00 AM  Result Value Ref Range Status   IFOBT Negative  Final  Resp panel by RT-PCR (RSV, Flu A&B, Covid) Anterior Nasal Swab     Status: None   Collection Time: 08/09/24 10:41 PM   Specimen: Anterior Nasal Swab  Result Value Ref Range Status   SARS Coronavirus 2 by RT PCR NEGATIVE NEGATIVE Final   Influenza A by PCR NEGATIVE NEGATIVE Final   Influenza B by PCR NEGATIVE NEGATIVE Final    Comment: (NOTE) The Xpert Xpress SARS-CoV-2/FLU/RSV plus assay is intended as an aid in the diagnosis of influenza from Nasopharyngeal swab specimens and should not be used as a sole basis for treatment. Nasal washings and aspirates are unacceptable for Xpert Xpress SARS-CoV-2/FLU/RSV testing.  Fact Sheet for Patients: bloggercourse.com  Fact Sheet for Healthcare Providers: seriousbroker.it  This test is not yet approved or cleared by the [place]  FDA and has been authorized for detection and/or diagnosis of SARS-CoV-2 by FDA under an Emergency Use Authorization (EUA). This EUA will remain in effect (meaning this test can be used) for the duration of the COVID-19 declaration under Section 564(b)(1) of the Act, 21 U.S.C. section 360bbb-3(b)(1), unless the authorization is terminated or revoked.     Resp Syncytial Virus by PCR NEGATIVE NEGATIVE Final    Comment: (NOTE) Fact Sheet for Patients: bloggercourse.com  Fact Sheet for Healthcare Providers: seriousbroker.it  This test is not yet approved or cleared by the [place]  FDA and has been authorized for detection and/or diagnosis of SARS-CoV-2 by FDA under an Emergency Use Authorization (EUA). This EUA will remain in effect (meaning this test can be used) for the duration of the COVID-19 declaration under Section 564(b)(1) of the Act, 21 U.S.C. section 360bbb-3(b)(1), unless the authorization is  terminated or revoked.  Performed at [facility], [address] [place], [place] [address]   Gram stain     Status: None   Collection Time: 08/10/24 10:40 AM   Specimen: Pleura  Result Value Ref Range Status   Specimen Description PLEURAL LEFT  Final   Special Requests NONE  Final   Gram Stain  Final    RARE WBC PRESENT, PREDOMINANTLY PMN NO ORGANISMS SEEN CYTOSPIN SMEAR Performed at [facility], [address] [place], [place] [address]    Report Status 08/10/2024 FINAL  Final  Culture, body fluid w Gram Stain-bottle     Status: None (Preliminary result)   Collection Time: 08/10/24 10:40 AM   Specimen: Pleura  Result Value Ref Range Status   Specimen Description PLEURAL LEFT  Final   Special Requests NONE  Final   Culture   Final    NO GROWTH 4 DAYS Performed at [facility], [address] [place], [place] [address]    Report Status PENDING  Incomplete     Radiology Studies: No results found.  Scheduled Meds:  apixaban   5 mg Oral BID   budesonide -glycopyrrolate -formoterol   2 puff Inhalation BID   furosemide   20 mg Intravenous BID   insulin  aspart  0-9 Units Subcutaneous Q4H   insulin  glargine-yfgn  10 Units Subcutaneous Q2200   insulin  glargine-yfgn  22 Units Subcutaneous Daily   melatonin  10 mg Oral QHS   predniSONE   60 mg Oral Q breakfast   Continuous Infusions:   LOS: 4 days   Time spent: 40 minutes  Casimer Dare, MD  [facility]  08/14/2024, 2:27 PM   "

## 2024-08-14 NOTE — Progress Notes (Signed)
 Hypoglycemic Event  CBG: 55  Treatment: 4 oz juice/soda  Symptoms: Hungry  Follow-up CBG: Time:737am CBG Result:87  Possible Reasons for Event: Inadequate meal intake  Comments/MD notified:MD. Amin    Kelly Gibson [name]

## 2024-08-14 NOTE — Progress Notes (Signed)
 Heart Failure Navigator Progress Note  Assessed for Heart & Vascular TOC clinic readiness.  Patient does not meet criteria due to [facility] follow up scheduled for 2/6.   Navigator available for reassessment of patient.   Duwaine Plant, PharmD, BCPS Heart Failure Stewardship Pharmacist Phone [phone]

## 2024-08-14 NOTE — Plan of Care (Signed)
  Problem: Education: Goal: Knowledge of General Education information will improve Description: Including pain rating scale, medication(s)/side effects and non-pharmacologic comfort measures Outcome: Progressing   Problem: Clinical Measurements: Goal: Respiratory complications will improve Outcome: Progressing   Problem: Coping: Goal: Level of anxiety will decrease Outcome: Progressing   Problem: Elimination: Goal: Will not experience complications related to urinary retention Outcome: Progressing   

## 2024-08-14 NOTE — Plan of Care (Signed)
   Problem: Skin Integrity: Goal: Risk for impaired skin integrity will decrease Outcome: Progressing

## 2024-08-15 ENCOUNTER — Inpatient Hospital Stay

## 2024-08-15 ENCOUNTER — Telehealth: Payer: Self-pay

## 2024-08-15 ENCOUNTER — Inpatient Hospital Stay: Admitting: Internal Medicine

## 2024-08-15 ENCOUNTER — Other Ambulatory Visit (HOSPITAL_COMMUNITY): Payer: Self-pay

## 2024-08-15 LAB — CULTURE, BODY FLUID W GRAM STAIN -BOTTLE: Culture: NO GROWTH

## 2024-08-15 LAB — GLUCOSE, CAPILLARY
Glucose-Capillary: 107 mg/dL — ABNORMAL HIGH (ref 70–99)
Glucose-Capillary: 115 mg/dL — ABNORMAL HIGH (ref 70–99)
Glucose-Capillary: 150 mg/dL — ABNORMAL HIGH (ref 70–99)
Glucose-Capillary: 246 mg/dL — ABNORMAL HIGH (ref 70–99)

## 2024-08-15 MED ORDER — FUROSEMIDE 40 MG PO TABS
40.0000 mg | ORAL_TABLET | Freq: Two times a day (BID) | ORAL | 0 refills | Status: AC
  Filled 2024-08-15: qty 60, 30d supply, fill #0

## 2024-08-15 MED ORDER — POTASSIUM CHLORIDE CRYS ER 20 MEQ PO TBCR
40.0000 meq | EXTENDED_RELEASE_TABLET | Freq: Once | ORAL | Status: AC
  Administered 2024-08-15: 40 meq via ORAL
  Filled 2024-08-15: qty 2

## 2024-08-15 NOTE — Progress Notes (Signed)
 Discharge   Patient and spouse  expressed verbal understanding of discharge POC.   Patient and spouse given time to ask any questions.  Additional education included in AVS.  Alert oriented in good spirits.   Tele and PIV removed. Pressure dressings intact.  CCMD/ All personal belongings at bedtime. TOC meds ready.  Discharge  Main A.

## 2024-08-15 NOTE — Care Management Important Message (Signed)
 Important Message  Patient Details  Name: Kelly Gibson MRN: [id number] Date of Birth: [birth year]   Important Message Given:  Yes - Medicare IM     Vonzell Arrie Sharps 08/15/2024, 8:12 AM

## 2024-08-15 NOTE — TOC Transition Note (Addendum)
 Transition of Care [facility]) - Discharge Note   Patient Details  Name: Kelly Gibson MRN: [id number] Date of Birth: Jul 11, 1961  [facility] of [facility]) CM/SW Contact:  Waddell Barnie Rama, RN Phone Number: 08/15/2024, 12:37 PM   Clinical Narrative:    For dc today, spouse at bedside to transport her home.  She has a virtual follow up tomorrow with her PCP.  Patient states she does not want any HH services.   Final next level of care: Home/Self Care Barriers to Discharge: No Barriers Identified   Patient Goals and CMS Choice Patient states their goals for this hospitalization and ongoing recovery are:: return home with spouse   Choice offered to / list presented to : NA      Discharge Placement                       Discharge Plan and Services Additional resources added to the After Visit Summary for   In-house Referral: NA Discharge Planning Services: CM Consult Post Acute Care Choice: NA          DME Arranged: N/A DME Agency: NA       HH Arranged: NA          Social Drivers of Health (SDOH) Interventions SDOH Screenings   Food Insecurity: Patient Declined (08/10/2024)  Housing: Patient Declined (08/10/2024)  Transportation Needs: Patient Declined (08/10/2024)  Utilities: Patient Declined (08/10/2024)  Alcohol Screen: Low Risk (04/18/2024)  Depression (PHQ2-9): Low Risk (05/18/2024)  Recent Concern: Depression (PHQ2-9) - Medium Risk (05/09/2024)  Financial Resource Strain: Low Risk (04/18/2024)  Physical Activity: Inactive (04/18/2024)  Social Connections: Patient Declined (08/10/2024)  Stress: No Stress Concern Present (04/18/2024)  Tobacco Use: Medium Risk (08/09/2024)  Health Literacy: Adequate Health Literacy (04/18/2024)     Readmission Risk Interventions    08/15/2024   12:30 PM  Readmission Risk Prevention Plan  Transportation Screening Complete  Medication Review (RN Care Manager) Complete  PCP or Specialist appointment within 3-5 days of  discharge Complete  HRI or Home Care Consult Complete  Palliative Care Screening Not Applicable  Skilled Nursing Facility Not Applicable

## 2024-08-15 NOTE — TOC Initial Note (Signed)
 Transition of Care [facility]) - Initial/Assessment Note    Patient Details  Name: Kelly Gibson MRN: [id number] Date of Birth: Jun 30, 1961  [facility]) CM/SW Contact:    Waddell Barnie Rama, RN Phone Number: 08/15/2024, 12:35 PM  Clinical Narrative:                 From home with spouse, has PCP and insurance on file, states has no HH services in place at this time ,  has hemi walker, bsc, 3 n1, stair lift, w/chair, scooter  at home.  States family member will transport them home at [employer] and family is support system, states gets medications from [facility] on [address].   Pta self ambulatory.   There are no ICM  needs identified  at this time.  Please place consult for ICM  needs.    Expected Discharge Plan: Home/Self Care Barriers to Discharge: No Barriers Identified   Patient Goals and CMS Choice Patient states their goals for this hospitalization and ongoing recovery are:: return home with spouse   Choice offered to / list presented to : NA      Expected Discharge Plan and Services In-house Referral: NA Discharge Planning Services: CM Consult Post Acute Care Choice: NA Living arrangements for the past 2 months: Single Family Home Expected Discharge Date: 08/15/24               DME Arranged: N/A DME Agency: NA       HH Arranged: NA          Prior Living Arrangements/Services Living arrangements for the past 2 months: Single Family Home Lives with:: Spouse Patient language and need for interpreter reviewed:: Yes Do you feel safe going back to the place where you live?: Yes      Need for Family Participation in Patient Care: Yes (Comment) Care giver support system in place?: Yes (comment) Current home services: DME (hemi walker, bsc, 3 n1, stair lift, w/chair, scooter) Criminal Activity/Legal Involvement Pertinent to Current Situation/Hospitalization: No - Comment as needed  Activities of Daily Living   ADL Screening (condition at time of  admission) Independently performs ADLs?: No Does the patient have a NEW difficulty with bathing/dressing/toileting/self-feeding that is expected to last >3 days?: No Does the patient have a NEW difficulty with getting in/out of bed, walking, or climbing stairs that is expected to last >3 days?: No Does the patient have a NEW difficulty with communication that is expected to last >3 days?: No Is the patient deaf or have difficulty hearing?: No Does the patient have difficulty seeing, even when wearing glasses/contacts?: No Does the patient have difficulty concentrating, remembering, or making decisions?: No  Permission Sought/Granted Permission sought to share information with : Case Manager Permission granted to share information with : Yes, Verbal Permission Granted              Emotional Assessment Appearance:: Appears stated age Attitude/Demeanor/Rapport: Engaged Affect (typically observed): Appropriate Orientation: : Oriented to Self, Oriented to Place, Oriented to  Time, Oriented to Situation Alcohol / Substance Use: Not Applicable Psych Involvement: No (comment)  Admission diagnosis:  Depression with anxiety [F41.8] Pleural effusion [J90] COPD exacerbation (HCC) [J44.1] Acute on chronic respiratory failure with hypoxemia (HCC) [J96.21] Hyperlipidemia associated with type 2 diabetes mellitus (HCC) [E11.69, E78.5] Acute pulmonary embolism without acute cor pulmonale, unspecified pulmonary embolism type (HCC) [I26.99] Patient Active Problem List   Diagnosis Date Noted   Acute on chronic respiratory failure with hypoxemia (HCC)  08/10/2024   Chest pain 08/10/2024   CAP (community acquired pneumonia) 07/21/2024   Calculus of gallbladder without cholecystitis without obstruction 05/15/2024   Port-A-Cath in place 03/14/2024   Congestive heart failure (HCC) 01/24/2024   Nephrolithiasis 01/24/2024   Obesity, Class I, BMI 30-34.9 01/14/2024   Malignant neoplasm of lower lobe of  left lung (HCC) 01/03/2024   CAD (coronary artery disease), native coronary artery 12/12/2023   Goals of care, counseling/discussion 12/12/2023   Sick sinus syndrome (HCC) 12/12/2023   Acute on chronic heart failure with preserved ejection fraction (HFpEF) (HCC) 12/09/2023   Major depression in remission 04/25/2023   Hyperlipidemia associated with type 2 diabetes mellitus (HCC) 08/29/2021   Insomnia 11/04/2020   Hemiparesis affecting right side as late effect of cerebrovascular accident (CVA) (HCC) 07/04/2020   CKD stage 3a, GFR 45-59 ml/min (HCC) - Baseline Scr 1.2-1.5 05/16/2019   Dysphagia, post-stroke    Cerebrovascular disease 10/18/2017   COPD with acute exacerbation (HCC) 02/18/2015   Hypertension associated with stage 3b chronic kidney disease due to type 2 diabetes mellitus (HCC) 12/17/2011   Uncontrolled type 2 diabetes mellitus with hyperglycemia, with long-term current use of insulin  (HCC) 12/14/2011   PCP:  Oris [name], NP Pharmacy:   [facility] [id number] - , [place] - [address] [place] [address] Phone: [phone] Fax: [phone]  [name] [address] [place] [address] Phone: [phone] Fax: [phone]     Social Drivers of Health (SDOH) Social History: SDOH Screenings   Food Insecurity: Patient Declined (08/10/2024)  Housing: Patient Declined (08/10/2024)  Transportation Needs: Patient Declined (08/10/2024)  Utilities: Patient Declined (08/10/2024)  Alcohol Screen: Low Risk (04/18/2024)  Depression (PHQ2-9): Low Risk (05/18/2024)  Recent Concern: Depression (PHQ2-9) - Medium Risk (05/09/2024)  Financial Resource Strain: Low Risk (04/18/2024)  Physical Activity: Inactive (04/18/2024)  Social Connections: Patient Declined (08/10/2024)  Stress: No Stress Concern Present (04/18/2024)  Tobacco Use: Medium Risk (08/09/2024)  Health Literacy: Adequate Health Literacy  (04/18/2024)   SDOH Interventions:     Readmission Risk Interventions    08/15/2024   12:30 PM  Readmission Risk Prevention Plan  Transportation Screening Complete  Medication Review (RN Care Manager) Complete  PCP or Specialist appointment within 3-5 days of discharge Complete  HRI or Home Care Consult Complete  Palliative Care Screening Not Applicable  Skilled Nursing Facility Not Applicable

## 2024-08-15 NOTE — Telephone Encounter (Signed)
 Called pt. Back and lt. Message that we can do her hospital f/u tomorrow virtually that way she doesn't have to have two separate appts.    Copied from CRM #[id number]. Topic: Appointments - Scheduling Inquiry for Clinic >> Aug 15, 2024 12:47 PM Drema MATSU wrote: Reason for CRM: Barnie with [name] stated that pt has a hospital follow up for 02/03 and pt also has an appt for tomorrow for a 3 month follow up. Pt wants to know if they can do both tomorrow. Please call pt.

## 2024-08-15 NOTE — Progress Notes (Signed)
 " [name], DNP, AGNP-c [facility] [address] [place], [place] [address] Main Office [phone]  ESTABLISHED PATIENT- Gibson Follow-Up Visit on 08/16/2024 There were no vitals filed for this visit. There is no height or weight on file to calculate BMI.  Wt Readings from Last 3 Encounters:  08/15/24 177 lb 14.6 oz (80.7 kg)  08/01/24 181 lb (82.1 kg)  07/21/24 190 lb 11.2 oz (86.5 kg)     Subjective:  other Kelly Gibson f/u, f/u on chest x-ray a little weak, ) Recent recurrent hospitalization for ARF with hypoxia  (12/26 and again 01/15). Chronic L pleural effusion despite thoracentesis. No malignant cells in fluid. Currently on immunotherapy- chemo completed. HFpEF diastolic grade I likely cause determined.    Hospitalized 01/15-01/20/26 Resp distress, hypoxic (O2 60%), cyanotic, tachypneic (40's), wheezing. Afebrile. HR 40's. BiPap, duoneb Solumedrol, IV lasix  CXR: moderate-sized left pleural effusion, pulmonary edema, and progressive airspace disease at the left base which may be due to atelectasis versus pneumonia  Repeat ECHO: EF 55-60%. Grade I Diastolic dysfunction Sensitive SS insulin  Thoracentesis w/ IR removed - no growth to date. Cytology neg for malignant cells.  Repeat CXR persistent L pleural effusion Procalcitonin neg- abx stopped COREG  HELD ON DC Lasix  40mg  BID Needs cards f/u ASAP  History of Present Illness Kelly Gibson is a 64 year old female who presents for a follow-up visit after a recent hospitalization for respiratory distress.  She was recently hospitalized for respiratory distress, during which 500 cc of fluid was drained from her chest. Fluid analysis showed no cancer cells. Since returning home, she feels very tired but notes improvement in her breathing, with no heaviness or shortness of breath. She is not on home oxygen and has not used a nebulizer since discharge.  She has a history of bradycardia, which  led to the discontinuation of Coreg . She experiences chest pain, particularly with movement, and has not been monitoring her heart rate at home. She continues to take Lasix  40 mg twice daily but reports only a little urination.  Her blood sugars have been elevated, reaching the 300s, likely due to recent IV steroid use in the Gibson. She is on Tresiba  and Novolog , with a sliding scale for insulin  management. Her blood sugars are generally lower when not eating, ranging from 145 to 180, and sometimes lower. She has a decreased appetite, eating mainly cheeseburgers.  Her thyroid  medication has been increased to 40 mg twice a day, with doses given approximately 12 hours apart. She is concerned about nighttime dosing due to increased urination and the risk of falls.  A recent echocardiogram showed minimal decreased pumping strength of the heart. She has a follow-up with oncology in five days and with cardiology on February 6th.     Past medical history, surgical history, medications, allergies, family history and social history reviewed with patient today and changes made to appropriate areas of the chart.      Objective:    Physical Exam Vitals and nursing note reviewed.  Constitutional:      Appearance: She is ill-appearing. She is not toxic-appearing or diaphoretic.  Eyes:     Pupils: Pupils are equal, round, and reactive to light.  Cardiovascular:     Rate and Rhythm: Regular rhythm. Bradycardia present.     Pulses: Normal pulses.     Heart sounds: Normal heart sounds.  Pulmonary:     Effort: Tachypnea and prolonged expiration present. No accessory muscle usage, respiratory distress or retractions.  Breath sounds: Rhonchi present.  Musculoskeletal:     Right lower leg: Edema present.     Left lower leg: Edema present.  Skin:    General: Skin is warm and dry.     Capillary Refill: Capillary refill takes less than 2 seconds.  Neurological:     Mental Status: She is alert and  oriented to person, place, and time.     Motor: Weakness present.     Coordination: Coordination abnormal.     Gait: Gait abnormal.  Psychiatric:        Mood and Affect: Mood normal.         Assessment & Plan:   Assessment & Plan Acute on chronic respiratory failure with hypoxemia (HCC) COPD with acute exacerbation (HCC) Recent hospitalization for pleural effusion with 500 cc drained. No cancer cells in the fluid. Breathing improved post-drainage. Echocardiogram shows minimal decreased pumping strength, but heart is functioning well. Oncology follow-up scheduled to determine if effusion is related to cancer. - Will repeat chest x-ray in one week to monitor fluid levels - Will coordinate with oncology to determine if effusion is related to cancer     Acute on chronic heart failure with preserved ejection fraction (HFpEF) (HCC) Echocardiogram shows minimal decreased pumping strength. Heart rate low, carvedilol  held. No home oxygen or nebulizer use since discharge. Fluid restriction in place. Cardiologist follow-up scheduled. - Continue fluid restriction - Monitor heart rate and symptoms - Follow up with cardiologist on February 6th    Malignant neoplasm of lower lobe of left lung (HCC) No cancer cells noted in fliud removed. Ongoing therapy with immune modulators. Will follow.     Uncontrolled type 2 diabetes mellitus with hyperglycemia, with long-term current use of insulin  (HCC) Blood sugars elevated due to recent IV steroids. Current insulin  regimen includes Tresiba  and Novolog  with a sensitive sliding scale. Blood sugars generally well-controlled except when not eating. Endocrinologist follow-up scheduled for Kelly Gibson March. - Continue current insulin  regimen with sliding scale - Monitor blood sugars, especially when not eating - Follow up with endocrinologist in Kelly Gibson [place]    Sick sinus syndrome (HCC) Low heart rate, carvedilol  held. No major symptoms like palpitations or  dizziness. Cardiologist follow-up scheduled. - Continue to hold carvedilol  - Monitor heart rate and symptoms - Follow up with cardiologist on February 6th        [name], DNP, AGNP-c   "

## 2024-08-15 NOTE — Plan of Care (Signed)

## 2024-08-15 NOTE — Discharge Summary (Signed)
 " Physician Discharge Summary   Patient: Kelly Gibson MRN: [id number] DOB: 09-12-1960  Admit date:     08/09/2024  Discharge date: 08/15/24  Discharge Physician: Casimer Dare   PCP: Oris [name], NP   Recommendations at discharge:    Continue a low salt and fluid restriction diet   Discharge Diagnoses: Principal Problem:   Acute on chronic heart failure with preserved ejection fraction (HFpEF) (HCC) Active Problems:   COPD with acute exacerbation (HCC)   CAD (coronary artery disease), native coronary artery   Acute on chronic respiratory failure with hypoxemia (HCC)   Chest pain   Hospital Course:  64 year old female with PMH of stage III non-small cell lung cancer (s/p chemo, currently on immunotherapy), PE (on Eliquis ), CAD, HFpEF, AV block, sick sinus syndrome, COPD, HTN, HLD, DM2, CVA with residual right-sided hemiparesis, CKD stage III who presented to the ED with sudden onset shortness of breath, and was found to be hypoxic on arrival. Chest x-ray showed moderate-sized left-sided pleural effusion, pulmonary edema and she was started on BiPAP. She underwent thoracentesis with IR with removal of 500 mL, cultures with no growth, cytology pending. She has not been weaned off of oxygen, saturating well on room air. Repeat chest x-ray showed persistent left-sided pleural effusion   Assessment and Plan: Acute on chronic HFpEF proBNP 2561.  Chest x-ray showing increasing moderate-sized left pleural effusion and pulmonary edema.  Last echo done in June 2025 showing EF 60 to 65%, grade 2 diastolic dysfunction, moderate left atrial dilation, and mild mitral regurgitation.  Repeat Echocardiogram was obtained and demonstrated EF of 55- 60% There were left ventricular regional wall motion abnormalities. There is Grade 1 diastolic dysfunction.Her oxygen requirements are down.  - saturating well on room air (98-100%) at rest. Not mobile due to her right sided hemiparesis - discharged on lasix   40 mg BID - follow up with cardiology    AKI The patient's baseline creatinine appears to be about 1.3. Upon admission it was 1.5. As diuresis was begun, her creatinine has climbed. It was 1.72 on 08/11/2024 - Cr back to baseline now    Pneumonia Chest x-ray showing progressive airspace disease at the left lung base which may be due to atelectasis versus pneumonia.  COVID/influenza/RSV PCR negative. ?Bacterial pneumonia given no fever or leukocytosis.  Patient was given vancomycin  and cefepime  in the ED.  Procalcitonin level was negative and Abx stopped    Left pleural effusion ?Malignant given history of lung cancer versus related to heart failure versus parapneumonic.  Patient is on Eliquis  which was held. 500 cc obtained.  Gram stain and cultures have had no growth.  No chemistry was obtained.  - cytology negative for any malignant cells  - effusion likely related to her HFpEF, diuretics as above    Acute COPD exacerbation Work of breathing has improved with BiPAP - continue nebs/inhalers at home    Acute hypoxemic respiratory failure Oxygen saturation 60% on room air with EMS.  Work of breathing improved with BiPAP.  VBG without evidence of hypercapnia.   - on room air now  and saturating well    Chest pain and elevated troponin History of CAD Left heart cath done in June 2025 showing 3 distinct occlusions of mid LAD, proximal OM 2, mid RCA with faint collateralization.  Cardiology had felt that there were no good interventional options and patient had previously declined CABG.  Troponin 31> 39.  No longer having active chest pain at this time.  Suspect troponin elevation is due to demand ischemia in the setting of problems listed above.   Stage III non-small cell lung cancer Finished chemotherapy in September 2025 and currently on immunotherapy with Imfinzi  every 4 weeks.  Outpatient oncology follow-up.   History of PE - on eliquis    History of sick sinus syndrome Patient is on  Coreg  at home and heart rate currently in the 40s.  Not hypotensive - coreg  held on discharge    Hypertension Holding Coreg  due to bradycardia as above. Continue lasix    Insulin -dependent type 2 diabetes Resumed home meds    CKD stage IIIb Creatinine at baseline   Chronic anemia Hemoglobin stable.   Mood disorder Hyperlipidemia Continue home meds        Consultants: None Procedures performed: None  Disposition: Home Diet recommendation:  Cardiac diet DISCHARGE MEDICATION: Allergies as of 08/15/2024       Reactions   Latex Itching, Rash   Niacin Nausea And Vomiting        Medication List     PAUSE taking these medications    carvedilol  3.125 MG tablet Wait to take this until your doctor or other care provider tells you to start again. Commonly known as: COREG  Take 1 tablet (3.125 mg total) by mouth 2 (two) times daily with a meal.       TAKE these medications    amLODipine  10 MG tablet Commonly known as: NORVASC  Take 1 tablet (10 mg total) by mouth daily.   aspirin  EC 81 MG tablet Take 1 tablet (81 mg total) by mouth daily. Swallow whole.   Breztri  Aerosphere 160-9-4.8 MCG/ACT Aero inhaler Generic drug: budesonide -glycopyrrolate -formoterol  Inhale 2 puffs into the lungs 2 (two) times daily.   citalopram  10 MG tablet Commonly known as: CELEXA  TAKE 1 TABLET BY MOUTH DAILY   Dexcom G7 Sensor Misc Take 1 Device by mouth See admin instructions. Change sensor every 10 days   Eliquis  5 MG Tabs tablet Generic drug: apixaban  TAKE 1 TABLET BY MOUTH 2 TIMES A DAY   furosemide  40 MG tablet Commonly known as: LASIX  Take 1 tablet (40 mg total) by mouth 2 (two) times daily. What changed:  medication strength when to take this   lidocaine -prilocaine  cream Commonly known as: EMLA  Apply to the Port-A-Cath site 30-60 minutes before treatment   Melatonin 5 MG Chew Chew 10 mg by mouth at bedtime.   Multivitamin Gummies Womens Chew Chew 2 each by  mouth in the morning.   NovoLOG  FlexPen 100 UNIT/ML FlexPen Generic drug: insulin  aspart Inject 2-10 Units into the skin 3 (three) times daily with meals.   olmesartan  20 MG tablet Commonly known as: BENICAR  Take 1 tablet (20 mg total) by mouth in the morning.   potassium chloride  10 MEQ tablet Commonly known as: KLOR-CON  M Take 1 tablet (10 mEq total) by mouth daily.   prochlorperazine  10 MG tablet Commonly known as: COMPAZINE  Take 1 tablet (10 mg total) by mouth every 6 (six) hours as needed.   rosuvastatin  40 MG tablet Commonly known as: CRESTOR  TAKE 1 TABLET BY MOUTH DAILY   Tresiba  FlexTouch 100 UNIT/ML FlexTouch Pen Generic drug: insulin  degludec 22u AM and 10u PM.   UNABLE TO FIND Take 1 tablet by mouth with breakfast, with lunch, and with evening meal. GlucoGold -Berberine, Concentrated Cinnamon, Chromium, Banaba Leaf Extract   Ventolin  HFA 108 (90 Base) MCG/ACT inhaler Generic drug: albuterol  Inhale 2 puffs into the lungs every 6 (six) hours as needed for wheezing or shortness  of breath.   albuterol  (2.5 MG/3ML) 0.083% nebulizer solution Commonly known as: PROVENTIL  Inhale 3 mLs (2.5 mg total) into the lungs every 6 (six) hours as needed for wheezing or shortness of breath.        Discharge Exam: Filed Weights   08/13/24 0401 08/14/24 0358 08/15/24 0400  Weight: 82 kg 82.1 kg 80.7 kg   Physical Exam Vitals and nursing note reviewed.  Constitutional:      General: She is not in acute distress. Cardiovascular:     Rate and Rhythm: Normal rate.  Pulmonary:     Effort: No respiratory distress.  Abdominal:     General: There is no distension.  Musculoskeletal:     Right lower leg: No edema.     Left lower leg: No edema.  Neurological:     Comments: Right sided hemiparesis       Condition at discharge: stable  The results of significant diagnostics from this hospitalization (including imaging, microbiology, ancillary and laboratory) are listed  below for reference.   Imaging Studies: DG CHEST PORT 1 VIEW Result Date: 08/12/2024 EXAM: 1 VIEW(S) XRAY OF THE CHEST 08/12/2024 02:05:00 PM COMPARISON: 08/10/2024 CLINICAL HISTORY: Pleural effusion FINDINGS: LINES, TUBES AND DEVICES: Stable right IJ port catheter to proximal right atrium. LUNGS AND PLEURA: Persistent left retrocardiac consolidation/atelectasis. Left pleural effusion. Mild interstitial edema has increased. No pneumothorax. HEART AND MEDIASTINUM: Aortic atherosclerosis. BONES AND SOFT TISSUES: No acute osseous abnormality. IMPRESSION: 1. Persistent left pleural effusion. 2. Persistent left retrocardiac consolidation/atelectasis. 3. Mild interstitial edema has increased. Electronically signed by: Lonni Necessary MD 08/12/2024 05:59 PM EST RP Workstation: [id number]   IR THORACENTESIS LEFT ASP PLEURAL SPACE W/IMG GUIDE Result Date: 08/10/2024 INDICATION: Patient with a history of left lung cancer presents today with left pleural effusion. Interventional Radiology asked to perform a therapeutic and diagnostic left thoracentesis. EXAM: ULTRASOUND GUIDED THORACENTESIS MEDICATIONS: 1% lidocaine  10 ml COMPLICATIONS: None immediate. PROCEDURE: An ultrasound guided thoracentesis was thoroughly discussed with the patient and questions answered. The benefits, risks, alternatives and complications were also discussed. The patient understands and wishes to proceed with the procedure. Written consent was obtained. Ultrasound was performed to localize and mark an adequate pocket of fluid in the LEFT chest. The area was then prepped and draped in the normal sterile fashion. 1% Lidocaine  was used for local anesthesia. Under ultrasound guidance a 6 Fr Safe-T-Centesis catheter was introduced. Thoracentesis was performed. The catheter was removed and a dressing applied. FINDINGS: A total of approximately 500 ml of yellow fluid was removed. Samples were sent to the laboratory as requested by the clinical  team. IMPRESSION: Successful ultrasound guided LEFT thoracentesis yielding 500 ml of pleural fluid. Procedure performed by: Warren Dais, NP under supervision of Thom Hall, MD Electronically Signed   By: Thom Hall M.D.   On: 08/10/2024 12:38   DG Chest 1 View Result Date: 08/10/2024 EXAM: 1 VIEW(S) XRAY OF THE CHEST 08/10/2024 10:47:50 AM COMPARISON: 08/09/2024 CLINICAL HISTORY: Pleural effusion on left FINDINGS: LINES, TUBES AND DEVICES: Right chest port in similar position terminating in the high right atrium. LUNGS AND PLEURA: Slightly smaller moderate left pleural effusion with patchy airspace opacities in the left lung base, likely atelectasis. No pneumothorax. HEART AND MEDIASTINUM: No acute abnormality of the cardiac and mediastinal silhouettes. BONES AND SOFT TISSUES: Multilevel thoracic osteophytosis. No acute osseous abnormality. IMPRESSION: 1. Slightly smaller moderate left pleural effusion with patchy airspace opacities in the left lung base, likely atelectasis. 2. No pneumothorax Electronically signed by:  darren transue MD 08/10/2024 11:14 AM EST RP Workstation: [id number]   ECHOCARDIOGRAM COMPLETE Result Date: 08/10/2024    ECHOCARDIOGRAM REPORT   Patient Name:   CHASITEE ZENKER Date of Exam: 08/10/2024 Medical Rec #:  [id number]         Height:       63.0 in Accession #:    [id number]        Weight:       180.0 lb Date of Birth:  1960/09/14        BSA:          1.849 m Patient Age:    63 years          BP:           125/72 mmHg Patient Gender: F                 HR:           48 bpm. Exam Location:  Inpatient Procedure: 2D Echo, Cardiac Doppler, Color Doppler and Intracardiac            Opacification Agent (Both Spectral and Color Flow Doppler were            utilized during procedure). Indications:    Acute diastolic CHF  History:        Patient has prior history of Echocardiogram examinations. CHF.  Sonographer:    [facility] Referring Phys: [id number] RATHORE  Sonographer Comments:  Image acquisition challenging due to patient body habitus. IMPRESSIONS  1. Mild apical hypokinesis with overall preserved LV function.  2. Left ventricular ejection fraction, by estimation, is 55 to 60%. The left ventricle has normal function. The left ventricle demonstrates regional wall motion abnormalities (see scoring diagram/findings for description). There is mild left ventricular  hypertrophy. Left ventricular diastolic parameters are consistent with Grade I diastolic dysfunction (impaired relaxation). Elevated left atrial pressure.  3. Right ventricular systolic function is normal. The right ventricular size is normal.  4. Left atrial size was mildly dilated.  5. The mitral valve is normal in structure. Trivial mitral valve regurgitation. No evidence of mitral stenosis.  6. The aortic valve is tricuspid. Aortic valve regurgitation is not visualized. Aortic valve sclerosis/calcification is present, without any evidence of aortic stenosis.  7. The inferior vena cava is normal in size with greater than 50% respiratory variability, suggesting right atrial pressure of 3 mmHg. FINDINGS  Left Ventricle: Left ventricular ejection fraction, by estimation, is 55 to 60%. The left ventricle has normal function. The left ventricle demonstrates regional wall motion abnormalities. Definity  contrast agent was given IV to delineate the left ventricular endocardial borders. The left ventricular internal cavity size was normal in size. There is mild left ventricular hypertrophy. Left ventricular diastolic parameters are consistent with Grade I diastolic dysfunction (impaired relaxation). Elevated left atrial pressure. Right Ventricle: The right ventricular size is normal. Right ventricular systolic function is normal. Left Atrium: Left atrial size was mildly dilated. Right Atrium: Right atrial size was normal in size. Pericardium: There is no evidence of pericardial effusion. Mitral Valve: The mitral valve is normal in  structure. Trivial mitral valve regurgitation. No evidence of mitral valve stenosis. MV peak gradient, 6.1 mmHg. The mean mitral valve gradient is 2.0 mmHg. Tricuspid Valve: The tricuspid valve is normal in structure. Tricuspid valve regurgitation is trivial. No evidence of tricuspid stenosis. Aortic Valve: The aortic valve is tricuspid. Aortic valve regurgitation is not visualized. Aortic valve sclerosis/calcification is present, without any evidence of aortic stenosis.  Aortic valve mean gradient measures 6.0 mmHg. Aortic valve peak gradient measures 11.6 mmHg. Aortic valve area, by VTI measures 2.11 cm. Pulmonic Valve: The pulmonic valve was normal in structure. Pulmonic valve regurgitation is not visualized. No evidence of pulmonic stenosis. Aorta: The aortic root is normal in size and structure. Venous: The inferior vena cava is normal in size with greater than 50% respiratory variability, suggesting right atrial pressure of 3 mmHg. IAS/Shunts: No atrial level shunt detected by color flow Doppler. Additional Comments: Mild apical hypokinesis with overall preserved LV function.  LEFT VENTRICLE PLAX 2D LVIDd:         5.00 cm      Diastology LVIDs:         3.30 cm      LV e' medial:    3.15 cm/s LV PW:         0.90 cm      LV E/e' medial:  34.9 LV IVS:        1.20 cm      LV e' lateral:   3.59 cm/s LVOT diam:     2.00 cm      LV E/e' lateral: 30.6 LV SV:         79 LV SV Index:   43 LVOT Area:     3.14 cm LV IVRT:       102 msec  LV Volumes (MOD) LV vol d, MOD A2C: 157.0 ml LV vol d, MOD A4C: 157.0 ml LV vol s, MOD A2C: 65.5 ml LV vol s, MOD A4C: 78.2 ml LV SV MOD A2C:     91.5 ml LV SV MOD A4C:     157.0 ml LV SV MOD BP:      88.8 ml RIGHT VENTRICLE             IVC RV S prime:     11.70 cm/s  IVC diam: 2.00 cm TAPSE (M-mode): 2.7 cm                             PULMONARY VEINS                             Diastolic Velocity: 58.50 cm/s                             S/D Velocity:       1.60                              Systolic Velocity:  95.70 cm/s LEFT ATRIUM             Index        RIGHT ATRIUM           Index LA diam:        4.40 cm 2.38 cm/m   RA Area:     15.70 cm LA Vol (A2C):   94.7 ml 51.22 ml/m  RA Volume:   39.50 ml  21.36 ml/m LA Vol (A4C):   60.1 ml 32.50 ml/m LA Biplane Vol: 79.1 ml 42.78 ml/m  AORTIC VALVE AV Area (Vmax):    2.16 cm AV Area (Vmean):   2.18 cm AV Area (VTI):     2.11 cm AV Vmax:           170.00 cm/s  AV Vmean:          114.000 cm/s AV VTI:            0.376 m AV Peak Grad:      11.6 mmHg AV Mean Grad:      6.0 mmHg LVOT Vmax:         117.00 cm/s LVOT Vmean:        79.200 cm/s LVOT VTI:          0.253 m LVOT/AV VTI ratio: 0.67  AORTA Ao Root diam: 3.50 cm Ao Asc diam:  3.50 cm MITRAL VALVE MV Area (PHT): 1.89 cm     SHUNTS MV Area VTI:   1.67 cm     Systemic VTI:  0.25 m MV Peak grad:  6.1 mmHg     Systemic Diam: 2.00 cm MV Mean grad:  2.0 mmHg MV Vmax:       1.23 m/s MV Vmean:      71.2 cm/s MV Decel Time: 402 msec MV E velocity: 110.00 cm/s MV A velocity: 124.00 cm/s MV E/A ratio:  0.89 Redell Shallow MD Electronically signed by Redell Shallow MD Signature Date/Time: 08/10/2024/9:27:37 AM    Final    CT Chest Wo Contrast Result Date: 08/09/2024 EXAM: CT CHEST WITHOUT CONTRAST 08/08/2024 09:54:00 AM TECHNIQUE: CT of the chest was performed without the administration of intravenous contrast. Multiplanar reformatted images are provided for review. Automated exposure control, iterative reconstruction, and/or weight based adjustment of the mA/kV was utilized to reduce the radiation dose to as low as reasonably achievable. COMPARISON: CT of the chest from 05/02/2024, as well as plain film of the chest from 08/01/2024. CLINICAL HISTORY: Left-sided pleural effusion and history of prior lung carcinoma with increasing shortness of breath. FINDINGS: MEDIASTINUM: The heart is mildly enlarged in size. The esophagus is within normal limits. LYMPH NODES: A stable precarinal lymph node is noted. The  previously seen hilar lymphoid tissue is again identified but less well visualized due to the lack of IV contrast. Small AP window nodes are noted stable from the prior study. The right supraclavicular node is again noted and stable. LUNGS AND PLEURA: The lungs demonstrate mild atelectasis in the right base and right upper lobe. This is new from the prior exam. Increasing left-sided pleural effusion is noted with underlying lingular and lower lobe consolidation identified. The previously seen changes in the posterior left lower lobe are again identified, but somewhat obscured by decreased aeration related to the enlarging effusion. SOFT TISSUES/BONES: Right chest wall port is seen. UPPER ABDOMEN: Visualized upper abdomen shows cholelithiasis without complicating factors. A tiny left upper pole renal stone is noted. VASCULATURE: Atherosclerotic calcifications of the thoracic aorta are noted. Coronary calcifications are seen. IMPRESSION: 1. Increasing left-sided pleural effusion with underlying lingular and left lower lobe consolidation. 2. New mild atelectasis in the right base and right upper lobe. 3. Stable mediastinal and right supraclavicular lymph nodes. 4. No evidence of metastatic disease. Electronically signed by: Oneil Devonshire MD 08/09/2024 11:19 PM EST RP Workstation: [id number]   DG Chest Portable 1 View Result Date: 08/09/2024 CLINICAL DATA:  Cough EXAM: PORTABLE CHEST 1 VIEW COMPARISON:  08/01/2024, chest CT 05/02/2024 FINDINGS: Right-sided central venous port tip overlying the cavoatrial region. Cardiomegaly with aortic atherosclerosis. Moderate-sized left pleural effusion, increased compared to most recent prior. Progressive airspace disease at the left base. Vascular congestion and mild diffuse interstitial opacities could be due to background edema. IMPRESSION: 1. Moderate-sized left pleural effusion, increased compared to most recent  prior. Progressive airspace disease at the left base may be due  to atelectasis or pneumonia superimposed on patient's underlying known lung cancer and treatment changes. 2. Cardiomegaly with vascular congestion and mild diffuse interstitial opacities which may be due to edema. Electronically Signed   By: Luke Bun M.D.   On: 08/09/2024 22:55   DG Chest 2 View Result Date: 08/09/2024 EXAM: 2 VIEW(S) XRAY OF THE CHEST 08/01/2024 04:50:04 PM COMPARISON: 07/22/2024 CLINICAL HISTORY: f/u hospitalization for ARF, CHF FINDINGS: LINES, TUBES AND DEVICES: Right chest port terminating at the cavoatrial junction. LUNGS AND PLEURA: Patchy left lower lobe opacity, likely reflecting post-treatment changes when correlating with prior CT. Small left pleural effusion, mildly improved. No pneumothorax. HEART AND MEDIASTINUM: No acute abnormality of the cardiac and mediastinal silhouettes. BONES AND SOFT TISSUES: No acute osseous abnormality. IMPRESSION: 1. Small left pleural effusion, mildly improved. 2. Patchy left lower lobe opacity, likely reflecting post-treatment changes. Electronically signed by: Pinkie Pebbles MD 08/09/2024 11:48 AM EST RP Workstation: [id number]   DG Chest Port 1 View Result Date: 07/22/2024 EXAM: 1 VIEW(S) XRAY OF THE CHEST 07/22/2024 07:07:00 AM COMPARISON: Portable chest dated 07/21/2024 06:20 PM. CLINICAL HISTORY: SOB (shortness of breath). FINDINGS: LINES, TUBES AND DEVICES: The right IJ port catheter terminates at the superior cavoatrial junction as before. LUNGS AND PLEURA: Left pleural effusion with dense consolidation or atelectasis in the left lower lung field continues to be seen. The combination of findings opacifies the lower half of the left chest. Remainder of the lungs are clear. Overall aeration seems unchanged. No pneumothorax. HEART AND MEDIASTINUM: The heart is enlarged. No vascular congestion is seen. There is calcification in the transverse aorta with a stable mediastinum. BONES AND SOFT TISSUES: No acute osseous abnormality. IMPRESSION:  1. Left pleural effusion with dense consolidation or atelectasis in the left lower lung field, opacifying the lower half of the left chest, without significant change. 2. Cardiomegaly without vascular congestion. Electronically signed by: Francis Quam MD 07/22/2024 07:56 AM EST RP Workstation: [id number]   DG Chest Port 1 View Result Date: 07/21/2024 EXAM: 1 VIEW(S) XRAY OF THE CHEST 07/21/2024 06:22:00 PM COMPARISON: 07/21/2024 CLINICAL HISTORY: resp distress FINDINGS: LINES, TUBES AND DEVICES: Right chest wall Port-A-Cath in place with tip terminating over the expected region of the right atrium. LUNGS AND PLEURA: Small to moderate left pleural effusion with hazy opacity throughout the left lower hemithorax. Pulmonary edema. No pneumothorax. HEART AND MEDIASTINUM: Unchanged cardiomegaly. Atherosclerotic plaque. BONES AND SOFT TISSUES: No acute osseous abnormality. IMPRESSION: 1. Stable small to moderate left pleural effusion with left basilar atelectasis/airspace disease. 2. Pulmonary edema. 3. Unchanged cardiomegaly. 4. Right chest wall Port-A-Cath with tip projecting over the right atrium. Electronically signed by: Greig Pique MD 07/21/2024 08:14 PM EST RP Workstation: [id number]   DG Chest Port 1 View Result Date: 07/21/2024 CLINICAL DATA:  Sepsis EXAM: PORTABLE CHEST 1 VIEW COMPARISON:  January 12, 2024 FINDINGS: Mild cardiomegaly is noted. Right internal jugular Port-A-Cath is noted with distal tip in expected position of cavoatrial junction. Mild left basilar opacity is noted concerning for possible pneumonia or atelectasis with minimal associated pleural effusion. IMPRESSION: Mild left basilar opacity is noted concerning for possible pneumonia or atelectasis with minimal associated pleural effusion. Electronically Signed   By: Lynwood Landy Raddle M.D.   On: 07/21/2024 08:47    Microbiology: Results for orders placed or performed during the hospital encounter of 08/09/24  Resp panel by RT-PCR (RSV,  Flu A&B, Covid) Anterior Nasal Swab  Status: None   Collection Time: 08/09/24 10:41 PM   Specimen: Anterior Nasal Swab  Result Value Ref Range Status   SARS Coronavirus 2 by RT PCR NEGATIVE NEGATIVE Final   Influenza A by PCR NEGATIVE NEGATIVE Final   Influenza B by PCR NEGATIVE NEGATIVE Final    Comment: (NOTE) The Xpert Xpress SARS-CoV-2/FLU/RSV plus assay is intended as an aid in the diagnosis of influenza from Nasopharyngeal swab specimens and should not be used as a sole basis for treatment. Nasal washings and aspirates are unacceptable for Xpert Xpress SARS-CoV-2/FLU/RSV testing.  Fact Sheet for Patients: bloggercourse.com  Fact Sheet for Healthcare Providers: seriousbroker.it  This test is not yet approved or cleared by the [place]  FDA and has been authorized for detection and/or diagnosis of SARS-CoV-2 by FDA under an Emergency Use Authorization (EUA). This EUA will remain in effect (meaning this test can be used) for the duration of the COVID-19 declaration under Section 564(b)(1) of the Act, 21 U.S.C. section 360bbb-3(b)(1), unless the authorization is terminated or revoked.     Resp Syncytial Virus by PCR NEGATIVE NEGATIVE Final    Comment: (NOTE) Fact Sheet for Patients: bloggercourse.com  Fact Sheet for Healthcare Providers: seriousbroker.it  This test is not yet approved or cleared by the [place]  FDA and has been authorized for detection and/or diagnosis of SARS-CoV-2 by FDA under an Emergency Use Authorization (EUA). This EUA will remain in effect (meaning this test can be used) for the duration of the COVID-19 declaration under Section 564(b)(1) of the Act, 21 U.S.C. section 360bbb-3(b)(1), unless the authorization is terminated or revoked.  Performed at [facility], [address] [place], [place] [address]   Gram stain     Status:  None   Collection Time: 08/10/24 10:40 AM   Specimen: Pleura  Result Value Ref Range Status   Specimen Description PLEURAL LEFT  Final   Special Requests NONE  Final   Gram Stain   Final    RARE WBC PRESENT, PREDOMINANTLY PMN NO ORGANISMS SEEN CYTOSPIN SMEAR Performed at [facility], [address], [place], [place] [address]    Report Status 08/10/2024 FINAL  Final  Culture, body fluid w Gram Stain-bottle     Status: None   Collection Time: 08/10/24 10:40 AM   Specimen: Pleura  Result Value Ref Range Status   Specimen Description PLEURAL LEFT  Final   Special Requests NONE  Final   Culture   Final    NO GROWTH 5 DAYS Performed at [facility], [address], [place], [place] [address]    Report Status 08/15/2024 FINAL  Final    Labs: CBC: Recent Labs  Lab 08/09/24 2240 08/09/24 2304 08/11/24 0333 08/12/24 0225 08/13/24 0254 08/14/24 0252  WBC 8.4  --  12.1* 12.8* 9.5 8.7  NEUTROABS 6.8  --  11.0* 11.3* 8.2* 7.2  HGB 10.4* 11.2* 9.1* 9.3* 9.8* 10.9*  HCT 33.5* 33.0* 29.2* 29.9* 31.4* 33.7*  MCV 89.8  --  89.3 89.8 89.2 86.4  PLT 246  --  211 219 217 227   Basic Metabolic Panel: Recent Labs  Lab 08/10/24 0522 08/11/24 0333 08/12/24 0225 08/13/24 0254 08/14/24 0252  NA 138 140 138 141 139  K 5.0 4.9 4.7 4.4 3.7  CL 103 104 104 104 99  CO2 24 27 26 26 30   GLUCOSE 226* 129* 172* 139* 146*  BUN 25* 32* 37* 33* 34*  CREATININE 1.72* 1.56* 1.37* 1.13* 1.06*  CALCIUM  9.1 9.1 9.2 9.2 9.7  MG  --   --   --   --  2.1   Liver Function Tests: Recent Labs  Lab 08/09/24 2240 08/13/24 0254  AST 22 13*  ALT 11 7  ALKPHOS 87 67  BILITOT 0.3 0.2  PROT 6.9 6.3*  ALBUMIN 3.5 3.3*   CBG: Recent Labs  Lab 08/14/24 2001 08/15/24 0004 08/15/24 0411 08/15/24 0845 08/15/24 1104  GLUCAP 399* 246* 107* 115* 150*    Discharge time spent: 35 minutes  Signed: Casimer Dare, MD [facility] 08/15/2024 "

## 2024-08-16 ENCOUNTER — Telehealth: Payer: Self-pay | Admitting: Nurse Practitioner

## 2024-08-16 ENCOUNTER — Ambulatory Visit: Admitting: Family Medicine

## 2024-08-16 ENCOUNTER — Telehealth: Payer: Self-pay | Admitting: *Deleted

## 2024-08-16 ENCOUNTER — Encounter: Payer: Self-pay | Admitting: Nurse Practitioner

## 2024-08-16 DIAGNOSIS — Z794 Long term (current) use of insulin: Secondary | ICD-10-CM

## 2024-08-16 DIAGNOSIS — C3432 Malignant neoplasm of lower lobe, left bronchus or lung: Secondary | ICD-10-CM | POA: Diagnosis not present

## 2024-08-16 DIAGNOSIS — J441 Chronic obstructive pulmonary disease with (acute) exacerbation: Secondary | ICD-10-CM | POA: Diagnosis not present

## 2024-08-16 DIAGNOSIS — I495 Sick sinus syndrome: Secondary | ICD-10-CM

## 2024-08-16 DIAGNOSIS — J9621 Acute and chronic respiratory failure with hypoxia: Secondary | ICD-10-CM

## 2024-08-16 DIAGNOSIS — E1165 Type 2 diabetes mellitus with hyperglycemia: Secondary | ICD-10-CM

## 2024-08-16 DIAGNOSIS — I5033 Acute on chronic diastolic (congestive) heart failure: Secondary | ICD-10-CM

## 2024-08-16 LAB — MISC LABCORP TEST (SEND OUT): Labcorp test code: 9985

## 2024-08-16 NOTE — Transitions of Care (Post Inpatient/ED Visit) (Signed)
 "  08/16/2024  Name: Kelly Gibson MRN: [id number] DOB: 1960/11/12  Today's TOC FU Call Status: Today's TOC FU Call Status:: Successful TOC FU Call Completed TOC FU Call Complete Date: 08/16/24  Patient's Name and Date of Birth confirmed. Name, DOB  Transition Care Management Follow-up Telephone Call Date of Discharge: 08/15/24 Discharge Facility: [name]) Type of Discharge: Inpatient Admission Primary Inpatient Discharge Diagnosis:: Acute on chronic heart failure with preserved ejection fraction How have you been since you were released from the hospital?: Better Any questions or concerns?: Yes Patient Questions/Concerns:: Patient asked when she would get her strength back. Patient Questions/Concerns Addressed: Other: (RN discussed simple chair exercises)  Items Reviewed: Did you receive and understand the discharge instructions provided?: Yes Medications obtained,verified, and reconciled?: Yes (Medications Reviewed) Any new allergies since your discharge?: No Dietary orders reviewed?: No Do you have support at home?: Yes People in Home [RPT]: spouse Name of Support/Comfort Primary Source: Kayla  Medications Reviewed Today: Medications Reviewed Today     Reviewed by Kennieth Cathlean DEL, RN (Case Manager) on 08/16/24 at 1439  Med List Status: <None>   Medication Order Taking? Sig Documenting Provider Last Dose Status Informant  albuterol  (PROVENTIL ) (2.5 MG/3ML) 0.083% nebulizer solution [id number] Yes Inhale 3 mLs (2.5 mg total) into the lungs every 6 (six) hours as needed for wheezing or shortness of breath. Darci Pore, MD  Active Spouse/Significant Other, Pharmacy Records  albuterol  (VENTOLIN  HFA) 108 [phone] Base) MCG/ACT inhaler [id number] Yes Inhale 2 puffs into the lungs every 6 (six) hours as needed for wheezing or shortness of breath. Perri DELENA Meliton Mickey., MD  Active Spouse/Significant Other, Pharmacy Records  amLODipine  (NORVASC ) 10 MG tablet  [id number] Yes Take 1 tablet (10 mg total) by mouth daily. Singh, Prashant K, MD  Active Spouse/Significant Other, Pharmacy Records  aspirin  EC 81 MG tablet [id number] Yes Take 1 tablet (81 mg total) by mouth daily. Swallow whole. Perri DELENA Meliton Mickey., MD  Active Spouse/Significant Other, Pharmacy Records  budesonide -glycopyrrolate -formoterol  (BREZTRI  AEROSPHERE) 160-9-4.8 MCG/ACT AERO inhaler [id number] Yes Inhale 2 puffs into the lungs 2 (two) times daily. Perri DELENA Meliton Mickey., MD  Active Spouse/Significant Other, Pharmacy Records  carvedilol  (COREG ) 3.125 MG tablet [id number]  Take 1 tablet (3.125 mg total) by mouth 2 (two) times daily with a meal.  Patient not taking: Reported on 08/16/2024   Singh, Prashant K, MD  Active Spouse/Significant Other, Pharmacy Records  citalopram  (CELEXA ) 10 MG tablet [id number] Yes TAKE 1 TABLET BY MOUTH DAILY Early, Sara E, NP  Active Spouse/Significant Other, Pharmacy Records  Continuous Glucose Sensor (DEXCOM G7 SENSOR) MISC [id number] Yes Take 1 Device by mouth See admin instructions. Change sensor every 10 days [provider]  Active Spouse/Significant Other, Pharmacy Records  ELIQUIS  5 MG TABS tablet [id number] Yes TAKE 1 TABLET BY MOUTH 2 TIMES A DAY Mohamed, Mohamed, MD  Active Spouse/Significant Other, Pharmacy Records  furosemide  (LASIX ) 40 MG tablet [id number] Yes Take 1 tablet (40 mg total) by mouth 2 (two) times daily. Caleen Colander, MD  Active   insulin  aspart (NOVOLOG  FLEXPEN) 100 UNIT/ML FlexPen [id number] Yes Inject 2-10 Units into the skin 3 (three) times daily with meals. [provider]  Active Spouse/Significant Other, Pharmacy Records  insulin  degludec (TRESIBA  FLEXTOUCH) 100 UNIT/ML FlexTouch Pen [id number] Yes 22u AM and 10u PM. Early, Sara E, NP  Active Spouse/Significant Other, Pharmacy Records  lidocaine -prilocaine  (EMLA ) cream [id number] Yes Apply to the Port-A-Cath site 30-60 minutes before treatment Sherrod Sherrod, MD  Active  Spouse/Significant Other, Pharmacy Records  Melatonin 5 MG CHEW [id number] Yes Chew 10 mg by mouth at bedtime. [provider]  Active Spouse/Significant Other, Pharmacy Records  Multiple Vitamins-Minerals (MULTIVITAMIN GUMMIES WOMENS) CICERO [id number] Yes Chew 2 each by mouth in the morning. [provider]  Active Spouse/Significant Other, Pharmacy Records  olmesartan  (BENICAR ) 20 MG tablet [id number] Yes Take 1 tablet (20 mg total) by mouth in the morning. Singh, Prashant K, MD  Active Spouse/Significant Other, Pharmacy Records  potassium chloride  (KLOR-CON  M) 10 MEQ tablet [id number] Yes Take 1 tablet (10 mEq total) by mouth daily. Singh, Prashant K, MD  Active Spouse/Significant Other, Pharmacy Records  prochlorperazine  (COMPAZINE ) 10 MG tablet [id number] Yes Take 1 tablet (10 mg total) by mouth every 6 (six) hours as needed. Heilingoetter, Cassandra L, PA-C  Active Spouse/Significant Other, Pharmacy Records  rosuvastatin  (CRESTOR ) 40 MG tablet [id number] Yes TAKE 1 TABLET BY MOUTH DAILY Early, Sara E, NP  Active Spouse/Significant Other, Pharmacy Records  UNABLE TO FIND [id number] Yes Take 1 tablet by mouth with breakfast, with lunch, and with evening meal. GlucoGold -Berberine, Concentrated Cinnamon, Chromium, Banaba Leaf Extract [provider]  Active Spouse/Significant Other, Pharmacy Records  Med List Note Steffi Nian, CPhT 12/23/23 1134): Husband manages medications             Home Care and Equipment/Supplies: Were Home Health Services Ordered?: NA Any new equipment or medical supplies ordered?: NA  Functional Questionnaire: Do you need assistance with bathing/showering or dressing?: Yes Do you need assistance with meal preparation?: Yes Do you need assistance with eating?: No Do you have difficulty maintaining continence: Yes Do you need assistance with getting out of bed/getting out of a chair/moving?: Yes Do you have difficulty managing or taking  your medications?: Yes  Follow up appointments reviewed: PCP Follow-up appointment confirmed?: Yes Date of PCP follow-up appointment?: 08/16/24 Follow-up Provider: Lauraine Oris Driscilla Lionel Follow-up appointment confirmed?: Yes Date of Specialist follow-up appointment?: 08/21/24 Follow-Up Specialty Provider:: Powell Lessen Do you need transportation to your follow-up appointment?: No Do you understand care options if your condition(s) worsen?: Yes-patient verbalized understanding  SDOH Interventions Today    Flowsheet Row Most Recent Value  SDOH Interventions   Food Insecurity Interventions Intervention Not Indicated  Housing Interventions Intervention Not Indicated  Transportation Interventions Intervention Not Indicated, Patient Resources (Friends/Family)  Utilities Interventions Intervention Not Indicated    Goals Addressed             This Visit's Progress    VBCI Transitions of Care (TOC) Care Plan       Problems:  Recent Hospitalization for treatment of CHF Knowledge Deficit Related to CHF  Goal:  Over the next 30 days, the patient will not experience hospital readmission  Interventions:   Heart Failure Interventions: Provided education on low sodium diet Reviewed role of diuretics in prevention of fluid overload and management of heart failure; Discussed the importance of keeping all appointments with provider Provided patient with education about the role of exercise in the management of heart failure  Patient Self Care Activities:  Attend all scheduled provider appointments Call pharmacy for medication refills 3-7 days in advance of running out of medications Call provider office for new concerns or questions  Notify RN Care Manager of TOC call rescheduling needs Participate in Transition of Care Program/Attend TOC scheduled calls Take medications as prescribed    Plan:  An initial telephone outreach has been scheduled for: 08/24/2024 Next PCP  appointment scheduled for: [id number] Telephone follow  up appointment with care management team member scheduled for:  [id number] Bing Edison 1:00       Discussed and offered 30 day TOC program.  Patient   consented.  The patient has been provided with contact information for the care management team and has been advised to call with any health -related questions or concerns.  The patient verbalized understanding with current plan of care.  The patient is directed to their insurance card regarding availability of benefits coverage   Cathlean Headland BSN RN [facility] Health Care Management Coordinator [email or web address] Direct Dial: [phone]  Fax: [phone] Website: [email or web address]  "

## 2024-08-18 NOTE — Telephone Encounter (Signed)
 Called pt regarding her PAP meds & she had b=me call her husband howard to come & pick up

## 2024-08-21 ENCOUNTER — Inpatient Hospital Stay: Admitting: Nurse Practitioner

## 2024-08-21 ENCOUNTER — Inpatient Hospital Stay

## 2024-08-21 ENCOUNTER — Inpatient Hospital Stay: Admitting: Internal Medicine

## 2024-08-21 ENCOUNTER — Inpatient Hospital Stay: Attending: Internal Medicine | Admitting: Internal Medicine

## 2024-08-21 DIAGNOSIS — C3432 Malignant neoplasm of lower lobe, left bronchus or lung: Secondary | ICD-10-CM | POA: Diagnosis not present

## 2024-08-21 DIAGNOSIS — Z79620 Long term (current) use of immunosuppressive biologic: Secondary | ICD-10-CM | POA: Insufficient documentation

## 2024-08-21 DIAGNOSIS — Z5112 Encounter for antineoplastic immunotherapy: Secondary | ICD-10-CM | POA: Insufficient documentation

## 2024-08-21 NOTE — Telephone Encounter (Signed)
 Pt is approved on [place] ([employer] ) thru 07/26/2025 approval letter index

## 2024-08-21 NOTE — Progress Notes (Signed)
 " [facility] [phone]   [phone]  PROGRESS NOTE FOR TELEMEDICINE VISITS  Early, Kelly BRAVO, NP [address] [place] [address]  I connected withNAME@ on 08/21/24 at 10:45 AM EST by telephone visit and verified that I am speaking with the correct person using two identifiers.   I discussed the limitations, risks, security and privacy concerns of performing an evaluation and management service by telemedicine and the availability of in-person appointments. I also discussed with the patient that there may be a patient responsible charge related to this service. The patient expressed understanding and agreed to proceed.  Other persons participating in the visit and their role in the encounter: Husband Kayla  Patient's location:  Home Provider's location:  Home  DIAGNOSIS: Stage IIIC (T3, N3, M0) NSCLC, adenocarcinoma. She presented with left lower lobe mass and intermittent activity with nonenlarged mediastinal lymph nodes.  She was diagnosed in June 2025.   Biomarker Findings HRD signature - Cannot Be Determined Microsatellite status - Cannot Be Determined ? Tumor Mutational Burden - Cannot Be Determined Genomic Findings For a complete list of the genes assayed, please refer to the Appendix. KRAS G12V 7 Disease relevant genes with no reportable alterations: ALK, BRAF, EGFR, ERBB2, MET, RET, ROS1      PRIOR THERAPY: Concurrent chemoradiation with weekly carboplatin  for an AUC of 2 and paclitaxel  45 mg/m.  First dose on 02/28/2024.  Status post 6 cycles.  Last dose was given on 04/04/2024.   CURRENT THERAPY: Consolidation treatment with immunotherapy with Imfinzi  1500 mg IV every 4 weeks.  First dose May 16, 2024.  Status post 3 cycles.  INTERVAL HISTORY: Kelly Gibson 64 y.o. female has a telephone virtual visit with me today for evaluation and discussion of her scan results.  She was accompanied by her husband Kayla.  The  patient is feeling fine with no concerning complaints except for the baseline shortness of breath.  She denied having any current chest pain, cough or hemoptysis.  She has no nausea, vomiting, diarrhea or constipation.  She mentioned that she is getting stronger every day.  She has no significant weight loss or night sweats.  She had repeat CT scan of the chest 10 days ago and that showed no concerning finding for disease progression except for increased of left pleural effusion.  She underwent ultrasound-guided thoracentesis with drainage of 500 cc of this fluid and the cytology was negative for malignancy.  MEDICAL HISTORY: Past Medical History:  Diagnosis Date   Acute pulmonary embolism without acute cor pulmonale (HCC) 01/13/2024   Bradycardia    SR/SB with up to 3.5 second pause (asymptomatic) on ~ 10/2017 event monitor; saw EP Dr. Kelsie: avoid AV nodal blocking agents   Chronic kidney disease    COPD (chronic obstructive pulmonary disease) (HCC)    Depression    Depression with anxiety 08/29/2021   Diabetes mellitus    Dysarthria, post-stroke    Dysrhythmia    Ganglion cyst 03/09/2012   Heart block AV complete (HCC) 12/11/2023   Hematuria 01/24/2024   History of kidney stones    Hyperlipidemia    Hypertension    Hypokalemia    Ingrown toenail of right foot 05/15/2024   Myocardial infarction [facility])    NSTEMI   Non-ST elevation (NSTEMI) myocardial infarction (HCC) 12/11/2023   Obesity, unspecified 12/17/2011   Pneumonia    Pneumonia of left lower lobe due to infectious organism 12/12/2023   PONV (postoperative nausea and vomiting)  Skin lesion of face 11/17/2022   Stroke [facility])    2019   right side hemiparesis    ALLERGIES:  is allergic to latex and niacin.  MEDICATIONS:  Current Outpatient Medications  Medication Sig Dispense Refill   albuterol  (PROVENTIL ) (2.5 MG/3ML) 0.083% nebulizer solution Inhale 3 mLs (2.5 mg total) into the lungs every 6 (six) hours as needed for  wheezing or shortness of breath. 75 mL 12   albuterol  (VENTOLIN  HFA) 108 (90 Base) MCG/ACT inhaler Inhale 2 puffs into the lungs every 6 (six) hours as needed for wheezing or shortness of breath. 18 g 2   amLODipine  (NORVASC ) 10 MG tablet Take 1 tablet (10 mg total) by mouth daily. 30 tablet 0   aspirin  EC 81 MG tablet Take 1 tablet (81 mg total) by mouth daily. Swallow whole. 30 tablet 12   budesonide -glycopyrrolate -formoterol  (BREZTRI  AEROSPHERE) 160-9-4.8 MCG/ACT AERO inhaler Inhale 2 puffs into the lungs 2 (two) times daily. 10.7 g 1   [Paused] carvedilol  (COREG ) 3.125 MG tablet Take 1 tablet (3.125 mg total) by mouth 2 (two) times daily with a meal. (Patient not taking: Reported on 08/16/2024) 60 tablet 0   citalopram  (CELEXA ) 10 MG tablet TAKE 1 TABLET BY MOUTH DAILY 90 tablet 1   Continuous Glucose Sensor (DEXCOM G7 SENSOR) MISC Take 1 Device by mouth See admin instructions. Change sensor every 10 days     ELIQUIS  5 MG TABS tablet TAKE 1 TABLET BY MOUTH 2 TIMES A DAY 60 tablet 3   furosemide  (LASIX ) 40 MG tablet Take 1 tablet (40 mg total) by mouth 2 (two) times daily. 60 tablet 0   insulin  aspart (NOVOLOG  FLEXPEN) 100 UNIT/ML FlexPen Inject 2-10 Units into the skin 3 (three) times daily with meals.     insulin  degludec (TRESIBA  FLEXTOUCH) 100 UNIT/ML FlexTouch Pen 22u AM and 10u PM.     lidocaine -prilocaine  (EMLA ) cream Apply to the Port-A-Cath site 30-60 minutes before treatment 30 g 1   Melatonin 5 MG CHEW Chew 10 mg by mouth at bedtime.     Multiple Vitamins-Minerals (MULTIVITAMIN GUMMIES WOMENS) CHEW Chew 2 each by mouth in the morning.     olmesartan  (BENICAR ) 20 MG tablet Take 1 tablet (20 mg total) by mouth in the morning. 30 tablet 0   potassium chloride  (KLOR-CON  M) 10 MEQ tablet Take 1 tablet (10 mEq total) by mouth daily. 30 tablet 0   prochlorperazine  (COMPAZINE ) 10 MG tablet Take 1 tablet (10 mg total) by mouth every 6 (six) hours as needed. 30 tablet 2   rosuvastatin  (CRESTOR )  40 MG tablet TAKE 1 TABLET BY MOUTH DAILY 90 tablet 3   UNABLE TO FIND Take 1 tablet by mouth with breakfast, with lunch, and with evening meal. GlucoGold -Berberine, Concentrated Cinnamon, Chromium, Banaba Leaf Extract     No current facility-administered medications for this visit.    SURGICAL HISTORY:  Past Surgical History:  Procedure Laterality Date   CESAREAN SECTION     x 3   IR IMAGING GUIDED PORT INSERTION  03/09/2024   IR THORACENTESIS RIGHT ASP PLEURAL SPACE W/IMG GUIDE  08/10/2024   LEFT HEART CATH AND CORONARY ANGIOGRAPHY N/A 12/28/2023   Procedure: LEFT HEART CATH AND CORONARY ANGIOGRAPHY;  Surgeon: Elmira Newman PARAS, MD;  Location: [facility] INVASIVE CV LAB;  Service: Cardiovascular;  Laterality: N/A;   ORIF WRIST FRACTURE Right 05/04/2018   Procedure: OPEN REDUCTION INTERNAL FIXATION (ORIF) RIGHT  WRIST FRACTURE;  Surgeon: Cristy Bonner DASEN, MD;  Location: [facility] OR;  Service: Orthopedics;  Laterality: Right;   TUMOR REMOVAL     left shoulder   VIDEO BRONCHOSCOPY WITH ENDOBRONCHIAL NAVIGATION  01/03/2024   Procedure: VIDEO BRONCHOSCOPY WITH ENDOBRONCHIAL NAVIGATION;  Surgeon: Shelah Lamar RAMAN, MD;  Location: [facility] ENDOSCOPY;  Service: Pulmonary;;   VIDEO BRONCHOSCOPY WITH ENDOBRONCHIAL ULTRASOUND Bilateral 01/03/2024   Procedure: BRONCHOSCOPY, WITH EBUS;  Surgeon: Shelah Lamar RAMAN, MD;  Location[facility] ENDOSCOPY;  Service: Pulmonary;  Laterality: Bilateral;    REVIEW OF SYSTEMS:  A comprehensive review of systems was negative except for: Constitutional: positive for fatigue Respiratory: positive for dyspnea on exertion     LABORATORY DATA: Lab Results  Component Value Date   WBC 8.7 08/14/2024   HGB 10.9 (L) 08/14/2024   HCT 33.7 (L) 08/14/2024   MCV 86.4 08/14/2024   PLT 227 08/14/2024      Chemistry      Component Value Date/Time   NA 139 08/14/2024 0252   NA 142 08/01/2024 1600   K 3.7 08/14/2024 0252   CL 99 08/14/2024 0252   CO2 30 08/14/2024 0252   BUN 34 (H) 08/14/2024 0252    BUN 33 (H) 08/01/2024 1600   CREATININE 1.06 (H) 08/14/2024 0252   CREATININE 1.81 (H) 07/18/2024 1028   CREATININE 1.12 (H) 05/22/2020 1211      Component Value Date/Time   CALCIUM  9.7 08/14/2024 0252   CALCIUM  10.0 08/18/2023 0000   ALKPHOS 67 08/13/2024 0254   AST 13 (L) 08/13/2024 0254   AST 20 07/18/2024 1028   ALT 7 08/13/2024 0254   ALT 9 07/18/2024 1028   BILITOT 0.2 08/13/2024 0254   BILITOT 0.2 08/01/2024 1600   BILITOT 0.2 07/18/2024 1028       RADIOGRAPHIC STUDIES: DG CHEST PORT 1 VIEW Result Date: 08/12/2024 EXAM: 1 VIEW(S) XRAY OF THE CHEST 08/12/2024 02:05:00 PM COMPARISON: 08/10/2024 CLINICAL HISTORY: Pleural effusion FINDINGS: LINES, TUBES AND DEVICES: Stable right IJ port catheter to proximal right atrium. LUNGS AND PLEURA: Persistent left retrocardiac consolidation/atelectasis. Left pleural effusion. Mild interstitial edema has increased. No pneumothorax. HEART AND MEDIASTINUM: Aortic atherosclerosis. BONES AND SOFT TISSUES: No acute osseous abnormality. IMPRESSION: 1. Persistent left pleural effusion. 2. Persistent left retrocardiac consolidation/atelectasis. 3. Mild interstitial edema has increased. Electronically signed by: Lonni Necessary MD 08/12/2024 05:59 PM EST RP Workstation: [id number]   IR THORACENTESIS LEFT ASP PLEURAL SPACE W/IMG GUIDE Result Date: 08/10/2024 INDICATION: Patient with a history of left lung cancer presents today with left pleural effusion. Interventional Radiology asked to perform a therapeutic and diagnostic left thoracentesis. EXAM: ULTRASOUND GUIDED THORACENTESIS MEDICATIONS: 1% lidocaine  10 ml COMPLICATIONS: None immediate. PROCEDURE: An ultrasound guided thoracentesis was thoroughly discussed with the patient and questions answered. The benefits, risks, alternatives and complications were also discussed. The patient understands and wishes to proceed with the procedure. Written consent was obtained. Ultrasound was performed to  localize and mark an adequate pocket of fluid in the LEFT chest. The area was then prepped and draped in the normal sterile fashion. 1% Lidocaine  was used for local anesthesia. Under ultrasound guidance a 6 Fr Safe-T-Centesis catheter was introduced. Thoracentesis was performed. The catheter was removed and a dressing applied. FINDINGS: A total of approximately 500 ml of yellow fluid was removed. Samples were sent to the laboratory as requested by the clinical team. IMPRESSION: Successful ultrasound guided LEFT thoracentesis yielding 500 ml of pleural fluid. Procedure performed by: Warren Dais, NP under supervision of Thom Hall, MD Electronically Signed   By: Thom Hall HERO.D.  On: 08/10/2024 12:38   DG Chest 1 View Result Date: 08/10/2024 EXAM: 1 VIEW(S) XRAY OF THE CHEST 08/10/2024 10:47:50 AM COMPARISON: 08/09/2024 CLINICAL HISTORY: Pleural effusion on left FINDINGS: LINES, TUBES AND DEVICES: Right chest port in similar position terminating in the high right atrium. LUNGS AND PLEURA: Slightly smaller moderate left pleural effusion with patchy airspace opacities in the left lung base, likely atelectasis. No pneumothorax. HEART AND MEDIASTINUM: No acute abnormality of the cardiac and mediastinal silhouettes. BONES AND SOFT TISSUES: Multilevel thoracic osteophytosis. No acute osseous abnormality. IMPRESSION: 1. Slightly smaller moderate left pleural effusion with patchy airspace opacities in the left lung base, likely atelectasis. 2. No pneumothorax Electronically signed by: rogelia myers MD 08/10/2024 11:14 AM EST RP Workstation: [id number]   ECHOCARDIOGRAM COMPLETE Result Date: 08/10/2024    ECHOCARDIOGRAM REPORT   Patient Name:   EZRIE BUNYAN Date of Exam: 08/10/2024 Medical Rec #:  [id number]         Height:       63.0 in Accession #:    [id number]        Weight:       180.0 lb Date of Birth:  12/26/1960        BSA:          1.849 m Patient Age:    63 years          BP:           125/72 mmHg  Patient Gender: F                 HR:           48 bpm. Exam Location:  Inpatient Procedure: 2D Echo, Cardiac Doppler, Color Doppler and Intracardiac            Opacification Agent (Both Spectral and Color Flow Doppler were            utilized during procedure). Indications:    Acute diastolic CHF  History:        Patient has prior history of Echocardiogram examinations. CHF.  Sonographer:    [facility] Referring Phys: [id number] RATHORE  Sonographer Comments: Image acquisition challenging due to patient body habitus. IMPRESSIONS  1. Mild apical hypokinesis with overall preserved LV function.  2. Left ventricular ejection fraction, by estimation, is 55 to 60%. The left ventricle has normal function. The left ventricle demonstrates regional wall motion abnormalities (see scoring diagram/findings for description). There is mild left ventricular  hypertrophy. Left ventricular diastolic parameters are consistent with Grade I diastolic dysfunction (impaired relaxation). Elevated left atrial pressure.  3. Right ventricular systolic function is normal. The right ventricular size is normal.  4. Left atrial size was mildly dilated.  5. The mitral valve is normal in structure. Trivial mitral valve regurgitation. No evidence of mitral stenosis.  6. The aortic valve is tricuspid. Aortic valve regurgitation is not visualized. Aortic valve sclerosis/calcification is present, without any evidence of aortic stenosis.  7. The inferior vena cava is normal in size with greater than 50% respiratory variability, suggesting right atrial pressure of 3 mmHg. FINDINGS  Left Ventricle: Left ventricular ejection fraction, by estimation, is 55 to 60%. The left ventricle has normal function. The left ventricle demonstrates regional wall motion abnormalities. Definity  contrast agent was given IV to delineate the left ventricular endocardial borders. The left ventricular internal cavity size was normal in size. There is mild left ventricular  hypertrophy. Left ventricular diastolic parameters are consistent with Grade I diastolic dysfunction (impaired relaxation). Elevated  left atrial pressure. Right Ventricle: The right ventricular size is normal. Right ventricular systolic function is normal. Left Atrium: Left atrial size was mildly dilated. Right Atrium: Right atrial size was normal in size. Pericardium: There is no evidence of pericardial effusion. Mitral Valve: The mitral valve is normal in structure. Trivial mitral valve regurgitation. No evidence of mitral valve stenosis. MV peak gradient, 6.1 mmHg. The mean mitral valve gradient is 2.0 mmHg. Tricuspid Valve: The tricuspid valve is normal in structure. Tricuspid valve regurgitation is trivial. No evidence of tricuspid stenosis. Aortic Valve: The aortic valve is tricuspid. Aortic valve regurgitation is not visualized. Aortic valve sclerosis/calcification is present, without any evidence of aortic stenosis. Aortic valve mean gradient measures 6.0 mmHg. Aortic valve peak gradient measures 11.6 mmHg. Aortic valve area, by VTI measures 2.11 cm. Pulmonic Valve: The pulmonic valve was normal in structure. Pulmonic valve regurgitation is not visualized. No evidence of pulmonic stenosis. Aorta: The aortic root is normal in size and structure. Venous: The inferior vena cava is normal in size with greater than 50% respiratory variability, suggesting right atrial pressure of 3 mmHg. IAS/Shunts: No atrial level shunt detected by color flow Doppler. Additional Comments: Mild apical hypokinesis with overall preserved LV function.  LEFT VENTRICLE PLAX 2D LVIDd:         5.00 cm      Diastology LVIDs:         3.30 cm      LV e' medial:    3.15 cm/s LV PW:         0.90 cm      LV E/e' medial:  34.9 LV IVS:        1.20 cm      LV e' lateral:   3.59 cm/s LVOT diam:     2.00 cm      LV E/e' lateral: 30.6 LV SV:         79 LV SV Index:   43 LVOT Area:     3.14 cm LV IVRT:       102 msec  LV Volumes (MOD) LV vol d, MOD  A2C: 157.0 ml LV vol d, MOD A4C: 157.0 ml LV vol s, MOD A2C: 65.5 ml LV vol s, MOD A4C: 78.2 ml LV SV MOD A2C:     91.5 ml LV SV MOD A4C:     157.0 ml LV SV MOD BP:      88.8 ml RIGHT VENTRICLE             IVC RV S prime:     11.70 cm/s  IVC diam: 2.00 cm TAPSE (M-mode): 2.7 cm                             PULMONARY VEINS                             Diastolic Velocity: 58.50 cm/s                             S/D Velocity:       1.60                             Systolic Velocity:  95.70 cm/s LEFT ATRIUM             Index  RIGHT ATRIUM           Index LA diam:        4.40 cm 2.38 cm/m   RA Area:     15.70 cm LA Vol (A2C):   94.7 ml 51.22 ml/m  RA Volume:   39.50 ml  21.36 ml/m LA Vol (A4C):   60.1 ml 32.50 ml/m LA Biplane Vol: 79.1 ml 42.78 ml/m  AORTIC VALVE AV Area (Vmax):    2.16 cm AV Area (Vmean):   2.18 cm AV Area (VTI):     2.11 cm AV Vmax:           170.00 cm/s AV Vmean:          114.000 cm/s AV VTI:            0.376 m AV Peak Grad:      11.6 mmHg AV Mean Grad:      6.0 mmHg LVOT Vmax:         117.00 cm/s LVOT Vmean:        79.200 cm/s LVOT VTI:          0.253 m LVOT/AV VTI ratio: 0.67  AORTA Ao Root diam: 3.50 cm Ao Asc diam:  3.50 cm MITRAL VALVE MV Area (PHT): 1.89 cm     SHUNTS MV Area VTI:   1.67 cm     Systemic VTI:  0.25 m MV Peak grad:  6.1 mmHg     Systemic Diam: 2.00 cm MV Mean grad:  2.0 mmHg MV Vmax:       1.23 m/s MV Vmean:      71.2 cm/s MV Decel Time: 402 msec MV E velocity: 110.00 cm/s MV A velocity: 124.00 cm/s MV E/A ratio:  0.89 Redell Shallow MD Electronically signed by Redell Shallow MD Signature Date/Time: 08/10/2024/9:27:37 AM    Final    CT Chest Wo Contrast Result Date: 08/09/2024 EXAM: CT CHEST WITHOUT CONTRAST 08/08/2024 09:54:00 AM TECHNIQUE: CT of the chest was performed without the administration of intravenous contrast. Multiplanar reformatted images are provided for review. Automated exposure control, iterative reconstruction, and/or weight based adjustment of  the mA/kV was utilized to reduce the radiation dose to as low as reasonably achievable. COMPARISON: CT of the chest from 05/02/2024, as well as plain film of the chest from 08/01/2024. CLINICAL HISTORY: Left-sided pleural effusion and history of prior lung carcinoma with increasing shortness of breath. FINDINGS: MEDIASTINUM: The heart is mildly enlarged in size. The esophagus is within normal limits. LYMPH NODES: A stable precarinal lymph node is noted. The previously seen hilar lymphoid tissue is again identified but less well visualized due to the lack of IV contrast. Small AP window nodes are noted stable from the prior study. The right supraclavicular node is again noted and stable. LUNGS AND PLEURA: The lungs demonstrate mild atelectasis in the right base and right upper lobe. This is new from the prior exam. Increasing left-sided pleural effusion is noted with underlying lingular and lower lobe consolidation identified. The previously seen changes in the posterior left lower lobe are again identified, but somewhat obscured by decreased aeration related to the enlarging effusion. SOFT TISSUES/BONES: Right chest wall port is seen. UPPER ABDOMEN: Visualized upper abdomen shows cholelithiasis without complicating factors. A tiny left upper pole renal stone is noted. VASCULATURE: Atherosclerotic calcifications of the thoracic aorta are noted. Coronary calcifications are seen. IMPRESSION: 1. Increasing left-sided pleural effusion with underlying lingular and left lower lobe consolidation. 2. New mild atelectasis in the right  base and right upper lobe. 3. Stable mediastinal and right supraclavicular lymph nodes. 4. No evidence of metastatic disease. Electronically signed by: Oneil Devonshire MD 08/09/2024 11:19 PM EST RP Workstation: [id number]   DG Chest Portable 1 View Result Date: 08/09/2024 CLINICAL DATA:  Cough EXAM: PORTABLE CHEST 1 VIEW COMPARISON:  08/01/2024, chest CT 05/02/2024 FINDINGS: Right-sided central  venous port tip overlying the cavoatrial region. Cardiomegaly with aortic atherosclerosis. Moderate-sized left pleural effusion, increased compared to most recent prior. Progressive airspace disease at the left base. Vascular congestion and mild diffuse interstitial opacities could be due to background edema. IMPRESSION: 1. Moderate-sized left pleural effusion, increased compared to most recent prior. Progressive airspace disease at the left base may be due to atelectasis or pneumonia superimposed on patient's underlying known lung cancer and treatment changes. 2. Cardiomegaly with vascular congestion and mild diffuse interstitial opacities which may be due to edema. Electronically Signed   By: Luke Bun M.D.   On: 08/09/2024 22:55   DG Chest 2 View Result Date: 08/09/2024 EXAM: 2 VIEW(S) XRAY OF THE CHEST 08/01/2024 04:50:04 PM COMPARISON: 07/22/2024 CLINICAL HISTORY: f/u hospitalization for ARF, CHF FINDINGS: LINES, TUBES AND DEVICES: Right chest port terminating at the cavoatrial junction. LUNGS AND PLEURA: Patchy left lower lobe opacity, likely reflecting post-treatment changes when correlating with prior CT. Small left pleural effusion, mildly improved. No pneumothorax. HEART AND MEDIASTINUM: No acute abnormality of the cardiac and mediastinal silhouettes. BONES AND SOFT TISSUES: No acute osseous abnormality. IMPRESSION: 1. Small left pleural effusion, mildly improved. 2. Patchy left lower lobe opacity, likely reflecting post-treatment changes. Electronically signed by: Pinkie Pebbles MD 08/09/2024 11:48 AM EST RP Workstation: [id number]    ASSESSMENT AND PLAN: This is a very pleasant 64 years old African-American female with Stage IIIC (T3, N3, M0) NSCLC, adenocarcinoma. She presented with left lower lobe mass and intermittent activity with nonenlarged mediastinal lymph nodes.  She was diagnosed in June 2025. Molecular studies showed no actionable mutations. The patient underwent a course of  concurrent chemoradiation with weekly carboplatin  for AUC of 2 and paclitaxel  45 Mg/M2 status post 6 cycles.  Last dose was given on 04/04/2024. The patient tolerated her treatment fairly well except for occasional nausea and skin burns from radiation. She is currently undergoing consolidation treatment with immunotherapy with Imfinzi  1500 mg IV every 4 weeks status post 3 cycle.  She tolerated the first cycle of her treatment fairly well. She had repeat CT scan of the chest performed recently.  I personally independently reviewed the scan and discussed the result with the patient and her husband.  Her scan showed no concerning findings for disease progression except for the increase of the left pleural effusion and she underwent ultrasound-guided left thoracentesis with drainage of 500 cc of pleural fluid with negative cytology. I recommended for the patient to continue her current consolidation treatment with durvalumab  and she will proceed with cycle #4 on August 23, 2024. She will come back for follow-up visit in 4 weeks for evaluation before the next cycle of her treatment.  The patient was advised to call immediately if she has any other concerning symptoms in the interval. I discussed the assessment and treatment plan with the patient. The patient was provided an opportunity to ask questions and all were answered. The patient agreed with the plan and demonstrated an understanding of the instructions.   The patient was advised to call back or seek an in-person evaluation if the symptoms worsen or if the condition fails to improve  as anticipated.  I provided 20 minutes of non face-to-face telephone visit time during this encounter, and > 50% was spent counseling as documented under my assessment & plan.  Sherrod MARLA Sherrod, MD 08/21/2024 9:50 AM  Disclaimer: This note was dictated with voice recognition software. Similar sounding words can inadvertently be transcribed and may not be corrected upon  review.        "

## 2024-08-22 ENCOUNTER — Inpatient Hospital Stay

## 2024-08-23 ENCOUNTER — Inpatient Hospital Stay

## 2024-08-23 ENCOUNTER — Other Ambulatory Visit: Payer: Self-pay

## 2024-08-23 ENCOUNTER — Ambulatory Visit: Payer: Self-pay | Admitting: Podiatry

## 2024-08-23 ENCOUNTER — Telehealth: Payer: Self-pay | Admitting: Internal Medicine

## 2024-08-23 ENCOUNTER — Telehealth: Payer: Self-pay

## 2024-08-23 ENCOUNTER — Inpatient Hospital Stay: Admitting: Nurse Practitioner

## 2024-08-23 ENCOUNTER — Other Ambulatory Visit: Payer: Self-pay | Admitting: Physician Assistant

## 2024-08-23 VITALS — BP 129/47 | HR 52 | Temp 97.8°F | Resp 17

## 2024-08-23 DIAGNOSIS — Z79620 Long term (current) use of immunosuppressive biologic: Secondary | ICD-10-CM | POA: Diagnosis not present

## 2024-08-23 DIAGNOSIS — C3432 Malignant neoplasm of lower lobe, left bronchus or lung: Secondary | ICD-10-CM | POA: Diagnosis present

## 2024-08-23 DIAGNOSIS — R42 Dizziness and giddiness: Secondary | ICD-10-CM

## 2024-08-23 DIAGNOSIS — Z5112 Encounter for antineoplastic immunotherapy: Secondary | ICD-10-CM | POA: Diagnosis present

## 2024-08-23 LAB — CMP (CANCER CENTER ONLY)
ALT: 14 U/L (ref 0–44)
AST: 24 U/L (ref 15–41)
Albumin: 3.8 g/dL (ref 3.5–5.0)
Alkaline Phosphatase: 87 U/L (ref 38–126)
Anion gap: 12 (ref 5–15)
BUN: 22 mg/dL (ref 8–23)
CO2: 27 mmol/L (ref 22–32)
Calcium: 9.2 mg/dL (ref 8.9–10.3)
Chloride: 104 mmol/L (ref 98–111)
Creatinine: 1.59 mg/dL — ABNORMAL HIGH (ref 0.44–1.00)
GFR, Estimated: 36 mL/min — ABNORMAL LOW
Glucose, Bld: 172 mg/dL — ABNORMAL HIGH (ref 70–99)
Potassium: 4.3 mmol/L (ref 3.5–5.1)
Sodium: 143 mmol/L (ref 135–145)
Total Bilirubin: 0.3 mg/dL (ref 0.0–1.2)
Total Protein: 6.9 g/dL (ref 6.5–8.1)

## 2024-08-23 LAB — CBC WITH DIFFERENTIAL (CANCER CENTER ONLY)
Abs Immature Granulocytes: 0.02 10*3/uL (ref 0.00–0.07)
Basophils Absolute: 0 10*3/uL (ref 0.0–0.1)
Basophils Relative: 1 %
Eosinophils Absolute: 0.2 10*3/uL (ref 0.0–0.5)
Eosinophils Relative: 3 %
HCT: 35.1 % — ABNORMAL LOW (ref 36.0–46.0)
Hemoglobin: 10.9 g/dL — ABNORMAL LOW (ref 12.0–15.0)
Immature Granulocytes: 0 %
Lymphocytes Relative: 9 %
Lymphs Abs: 0.5 10*3/uL — ABNORMAL LOW (ref 0.7–4.0)
MCH: 27.1 pg (ref 26.0–34.0)
MCHC: 31.1 g/dL (ref 30.0–36.0)
MCV: 87.3 fL (ref 80.0–100.0)
Monocytes Absolute: 0.5 10*3/uL (ref 0.1–1.0)
Monocytes Relative: 9 %
Neutro Abs: 4.6 10*3/uL (ref 1.7–7.7)
Neutrophils Relative %: 78 %
Platelet Count: 230 10*3/uL (ref 150–400)
RBC: 4.02 MIL/uL (ref 3.87–5.11)
RDW: 16.8 % — ABNORMAL HIGH (ref 11.5–15.5)
WBC Count: 5.8 10*3/uL (ref 4.0–10.5)
nRBC: 0 % (ref 0.0–0.2)

## 2024-08-23 MED ORDER — SODIUM CHLORIDE 0.9 % IV SOLN: INTRAVENOUS | Status: DC

## 2024-08-23 MED ORDER — SODIUM CHLORIDE 0.9 % IV SOLN
1500.0000 mg | Freq: Once | INTRAVENOUS | Status: AC
  Administered 2024-08-23: 1500 mg via INTRAVENOUS
  Filled 2024-08-23: qty 30

## 2024-08-23 NOTE — Telephone Encounter (Signed)
 Called in EKG results to Dr. Katharina office.  Staff states that she is sending a message to Dr. Katharina nurse to review with provider.  Faxed EKG results with confirmation at [phone].

## 2024-08-23 NOTE — Patient Instructions (Addendum)
 [facility] - A DEPT OF [facility]  Discharge Instructions: Go to ED if you have chest pain, shortness of breath like you had previously when you were admitted to the hospital, or dizziness that doesn't go away. Thank you for choosing [facility] to provide your oncology and hematology care.   If you have a lab appointment with the Cancer Center, please go directly to the Cancer Center and check in at the registration area.   Wear comfortable clothing and clothing appropriate for easy access to any Portacath or PICC line.   We strive to give you quality time with your provider. You may need to reschedule your appointment if you arrive late (15 or more minutes).  Arriving late affects you and other patients whose appointments are after yours.  Also, if you miss three or more appointments without notifying the office, you may be dismissed from the clinic at the providers discretion.      For prescription refill requests, have your pharmacy contact our office and allow 72 hours for refills to be completed.    Today you received the following chemotherapy and/or immunotherapy agents: Imfinzi    To help prevent nausea and vomiting after your treatment, we encourage you to take your nausea medication as directed.  BELOW ARE SYMPTOMS THAT SHOULD BE REPORTED IMMEDIATELY: *FEVER GREATER THAN 100.4 F (38 C) OR HIGHER *CHILLS OR SWEATING *NAUSEA AND VOMITING THAT IS NOT CONTROLLED WITH YOUR NAUSEA MEDICATION *UNUSUAL SHORTNESS OF BREATH *UNUSUAL BRUISING OR BLEEDING *URINARY PROBLEMS (pain or burning when urinating, or frequent urination) *BOWEL PROBLEMS (unusual diarrhea, constipation, pain near the anus) TENDERNESS IN MOUTH AND THROAT WITH OR WITHOUT PRESENCE OF ULCERS (sore throat, sores in mouth, or a toothache) UNUSUAL RASH, SWELLING OR PAIN  UNUSUAL VAGINAL DISCHARGE OR ITCHING   Items with * indicate a potential emergency and should be followed  up as soon as possible or go to the Emergency Department if any problems should occur.  Please show the CHEMOTHERAPY ALERT CARD or IMMUNOTHERAPY ALERT CARD at check-in to the Emergency Department and triage nurse.  Should you have questions after your visit or need to cancel or reschedule your appointment, please contact [facility] - A DEPT OF [facility]  Dept: [phone]  and follow the prompts.  Office hours are 8:00 a.m. to 4:30 p.m. Monday - Friday. Please note that voicemails left after 4:00 p.m. may not be returned until the following business day.  We are closed weekends and major holidays. You have access to a nurse at all times for urgent questions. Please call the main number to the clinic Dept: [phone] and follow the prompts.   For any non-urgent questions, you may also contact your provider using MyChart. We now offer e-Visits for anyone 3 and older to request care online for non-urgent symptoms. For details visit [email or web address].   Also download the MyChart app! Go to the app store, search MyChart, open the app, select [place], and log in with your MyChart username and password.

## 2024-08-23 NOTE — Progress Notes (Signed)
 Per Dr Sherrod ok to proceed with today's tx with creatnine of 1.59    1620 - Per Cassie PA, ok to restart Imfinzi .  Patient states she feels much better, Imfinzi  restarted, IVF flowing to gravity stopped. EKG in process.  EKG shows sinus bradycardia, taken to Cassie PA to review.  [facility]  Desk LPN to call tomorrow to see if patient can get appointment sooner.  Patient currently in NAD, denies any SOB or pain.  Patient DC'd home in stable condition, VS on flowsheet.  Instructed to go to ED if she has chest pain, SOB similar to when she was recently admitted to the hospital, or dizziness.  She & husband verbalize understanding.

## 2024-08-23 NOTE — Assessment & Plan Note (Signed)
 Blood sugars elevated due to recent IV steroids. Current insulin  regimen includes Tresiba  and Novolog  with a sensitive sliding scale. Blood sugars generally well-controlled except when not eating. Endocrinologist follow-up scheduled for Kelly Gibson March. - Continue current insulin  regimen with sliding scale - Monitor blood sugars, especially when not eating - Follow up with endocrinologist in [name]

## 2024-08-23 NOTE — Assessment & Plan Note (Signed)
 No cancer cells noted in fliud removed. Ongoing therapy with immune modulators. Will follow.

## 2024-08-23 NOTE — Assessment & Plan Note (Signed)
 Low heart rate, carvedilol  held. No major symptoms like palpitations or dizziness. Cardiologist follow-up scheduled. - Continue to hold carvedilol  - Monitor heart rate and symptoms - Follow up with cardiologist on February 6th

## 2024-08-23 NOTE — Progress Notes (Signed)
 At approximately 1555, patient started complaining of dizziness and light headedness. RN paused Imfinzi  infusion, VS stable (see flowsheet) IVF started and Cassie, PA notified and presented to infusion.

## 2024-08-23 NOTE — Assessment & Plan Note (Signed)
 Recent hospitalization for pleural effusion with 500 cc drained. No cancer cells in the fluid. Breathing improved post-drainage. Echocardiogram shows minimal decreased pumping strength, but heart is functioning well. Oncology follow-up scheduled to determine if effusion is related to cancer. - Will repeat chest x-ray in one week to monitor fluid levels - Will coordinate with oncology to determine if effusion is related to cancer

## 2024-08-23 NOTE — Assessment & Plan Note (Signed)
 Echocardiogram shows minimal decreased pumping strength. Heart rate low, carvedilol  held. No home oxygen or nebulizer use since discharge. Fluid restriction in place. Cardiologist follow-up scheduled. - Continue fluid restriction - Monitor heart rate and symptoms - Follow up with cardiologist on February 6th

## 2024-08-23 NOTE — Telephone Encounter (Signed)
 Calling to say the patient EKG showed  pro long Qts. And wanted to make the dr aware of it. Well fax over results. Please advise

## 2024-08-24 ENCOUNTER — Other Ambulatory Visit: Payer: Self-pay

## 2024-08-24 ENCOUNTER — Telehealth: Payer: Self-pay

## 2024-08-24 NOTE — Patient Instructions (Signed)
 Visit Information  Thank you for taking time to visit with me today. Please don't hesitate to contact me if I can be of assistance to you before our next scheduled telephone appointment.   Following is a copy of your care plan:   Goals Addressed             This Visit's Progress    VBCI Transitions of Care (TOC) Care Plan   On track    Problems:  Recent Hospitalization for treatment of CHF Knowledge Deficit Related to CHF  Goal:  Over the next 30 days, the patient will not experience hospital readmission  Interventions:   Heart Failure Interventions: Provided education on low sodium diet Reviewed role of diuretics in prevention of fluid overload and management of heart failure; Discussed the importance of keeping all appointments with provider Provided patient with education about the role of exercise in the management of heart failure  Patient Self Care Activities:  Attend all scheduled provider appointments Call pharmacy for medication refills 3-7 days in advance of running out of medications Call provider office for new concerns or questions  Notify RN Care Manager of TOC call rescheduling needs Participate in Transition of Care Program/Attend TOC scheduled calls Take medications as prescribed    Plan:  An initial telephone outreach has been scheduled for: 08/24/2024 Next PCP appointment scheduled for: 08/29/2024 in person Telephone follow up appointment with care management team member scheduled for:  09/01/2024 at 3 pm.         Patient verbalizes understanding of instructions and care plan provided today and agrees to view in MyChart. Active MyChart status and patient understanding of how to access instructions and care plan via MyChart confirmed with patient.     Telephone follow up appointment with care management team member scheduled for: Telephone follow up appointment with care management team member scheduled for:  09/01/2024 at 3 pm.   Please call the  care guide team at [phone] if you need to cancel or reschedule your appointment.   Please call the [place]  National Suicide Prevention Lifeline: 478-012-3311 or TTY: (501)077-0382 TTY 548-750-0330) to talk to a trained counselor call 1-800-273-TALK (toll free, 24 hour hotline) if you are experiencing a Mental Health or Behavioral Health Crisis or need someone to talk to.   Bing Edison MSN, RN RN [employer] [facility]  VBCI-Population Health Office Hours M-F [phone] Direct Dial: [phone] Main Phone [phone]  Fax: [phone] [email or web address]

## 2024-08-24 NOTE — Telephone Encounter (Signed)
 I have not seen this patient before.  It looks like she is seeing how Hao Meng on 09/01/2024.  Thanks MJP

## 2024-08-24 NOTE — Telephone Encounter (Signed)
 It appears another EKG was taken just now, QTc is now normal range

## 2024-08-24 NOTE — Transitions of Care (Post Inpatient/ED Visit) (Signed)
 " Transition of Care week 2  Visit Note  08/24/2024  Name: Kelly Gibson MRN: [id number]          DOB: 02-09-1961  Situation: Patient enrolled in [facility] 30-day program. Visit completed with patient by telephone.   Background:   Initial Transition Care Management Follow-up Telephone Call Discharge Date and Diagnosis: 08/15/24, Acute on chronic heart failure with preserved ejection fraction   Past Medical History:  Diagnosis Date   Acute pulmonary embolism without acute cor pulmonale (HCC) 01/13/2024   Bradycardia    SR/SB with up to 3.5 second pause (asymptomatic) on ~ 10/2017 event monitor; saw EP Dr. Kelsie: avoid AV nodal blocking agents   Chronic kidney disease    COPD (chronic obstructive pulmonary disease) (HCC)    Depression    Depression with anxiety 08/29/2021   Diabetes mellitus    Dysarthria, post-stroke    Dysrhythmia    Ganglion cyst 03/09/2012   Heart block AV complete (HCC) 12/11/2023   Hematuria 01/24/2024   History of kidney stones    Hyperlipidemia    Hypertension    Hypokalemia    Ingrown toenail of right foot 05/15/2024   Myocardial infarction [facility])    NSTEMI   Non-ST elevation (NSTEMI) myocardial infarction (HCC) 12/11/2023   Obesity, unspecified 12/17/2011   Pneumonia    Pneumonia of left lower lobe due to infectious organism 12/12/2023   PONV (postoperative nausea and vomiting)    Skin lesion of face 11/17/2022   Stroke [facility])    2019   right side hemiparesis    Assessment: Patient Reported Symptoms: Cognitive Cognitive Status: Alert and oriented to person, place, and time, Normal speech and language skills (Post CVA) Other: with residual right-sided hemiparesis      Neurological Neurological Review of Symptoms: Weakness Neurological Management Strategies: Adequate rest, Medical device, Medication therapy, Coping strategies Neurological Comment: with residual right-sided hemiparesis  HEENT HEENT Symptoms Reported: No symptoms reported       Cardiovascular Cardiovascular Symptoms Reported: No symptoms reported Does patient have uncontrolled Hypertension?: No Cardiovascular Management Strategies: Adequate rest, Coping strategies, Medical device, Fluid modification, Medication therapy, Routine screening Cardiovascular Self-Management Outcome: 4 (good) Cardiovascular Comment: Denies chest pain or shortness of breath  Respiratory Respiratory Symptoms Reported: No symptoms reported Additional Respiratory Details: Denies chest pain or shortness of breath Respiratory Management Strategies: Activity, Adequate rest, Medication therapy, Coping strategies, Routine screening Respiratory Self-Management Outcome: 4 (good)  Endocrine Endocrine Symptoms Reported: No symptoms reported Is patient diabetic?: Yes Is patient checking blood sugars at home?: Yes List most recent blood sugar readings, include date and time of day: Patient did not know, spouse takes this.    Gastrointestinal Gastrointestinal Symptoms Reported: No symptoms reported Additional Gastrointestinal Details: Denies issues.      Genitourinary Genitourinary Symptoms Reported: Not assessed    Integumentary Integumentary Symptoms Reported: Not assessed    Musculoskeletal Additional Musculoskeletal Details: with residual right-sided hemiparesis Musculoskeletal Management Strategies: Activity, Coping strategies, Medical device, Medication therapy, Routine screening      Psychosocial Psychosocial Symptoms Reported: No symptoms reported         There were no vitals filed for this visit. Pain Scale: 0-10 Pain Score: 0-No pain  Medications Reviewed Today   Medications were not reviewed in this encounter   08/24/2024 Patient declined medication review on this call, stating everything was going fine, no medication changes since last week, had virtual PCP follow up 08/16/2024 and in-person HFU scheduled for next week.     Goals Addressed  This Visit's  Progress    VBCI Transitions of Care (TOC) Care Plan   On track    Problems:  Recent Hospitalization for treatment of CHF Knowledge Deficit Related to CHF  Goal:  Over the next 30 days, the patient will not experience hospital readmission  Interventions:   Heart Failure Interventions: Provided education on low sodium diet Reviewed role of diuretics in prevention of fluid overload and management of heart failure; Discussed the importance of keeping all appointments with provider Provided patient with education about the role of exercise in the management of heart failure  Patient Self Care Activities:  Attend all scheduled provider appointments Call pharmacy for medication refills 3-7 days in advance of running out of medications Call provider office for new concerns or questions  Notify RN Care Manager of TOC call rescheduling needs Participate in Transition of Care Program/Attend TOC scheduled calls Take medications as prescribed    Plan:  An initial telephone outreach has been scheduled for: 08/24/2024 Next PCP appointment scheduled for: 08/29/2024 in person Telephone follow up appointment with care management team member scheduled for:  09/01/2024 at 3 pm.         08/24/2024 Patient declined medication review on this brief follow up call, stating everything was going fine, no medication changes since last week, had virtual PCP follow up 08/16/2024 and in-person HFU scheduled for next week. Stated she had no issues, concerns or questions.   Recommendation:   Continue Current Plan of Care  Follow Up Plan:   Telephone follow-up in 1 week Telephone follow up appointment with care management team member scheduled for:  09/01/2024 at 3 pm.    Bing Edison MSN, RN RN Case Manager [facility]  VBCI-Population Health Office Hours M-F [phone] Direct Dial: [phone] Main Phone [phone]  Fax: [phone] [email or web address]       "

## 2024-08-28 NOTE — Progress Notes (Unsigned)
" °  [name], DNP, AGNP-c [facility] [address] [place], [place] [address] Main Office [phone]  ESTABLISHED PATIENT- Hospital Follow-Up Visit on 08/29/2024 There were no vitals filed for this visit. There is no height or weight on file to calculate BMI.  Wt Readings from Last 3 Encounters:  08/15/24 177 lb 14.6 oz (80.7 kg)  08/01/24 181 lb (82.1 kg)  07/21/24 190 lb 11.2 oz (86.5 kg)     Subjective:  No chief complaint on file.   @[email or web address] @  {Hospital Follow-Up (Optional):34219}  Past medical history, surgical history, medications, allergies, family history and social history reviewed with patient today and changes made to appropriate areas of the chart.      Objective:    Physical Exam      Assessment & Plan:   Assessment & Plan      SaraBeth Early, DNP, AGNP-c  {SETIMEYorN (Optional):34216} "

## 2024-08-29 ENCOUNTER — Inpatient Hospital Stay: Admitting: Nurse Practitioner

## 2024-08-30 ENCOUNTER — Ambulatory Visit: Admitting: Podiatry

## 2024-08-31 ENCOUNTER — Other Ambulatory Visit

## 2024-08-31 NOTE — Transitions of Care (Post Inpatient/ED Visit) (Signed)
 " Transition of Care week 3  Visit Note  08/31/2024  Name: Kelly Gibson MRN: [id number]          DOB: 1960-12-03  Situation: Patient enrolled in [facility] 30-day program. Visit completed with patient and spouse by telephone.   Background:   Initial Transition Care Management Follow-up Telephone Call Discharge Date and Diagnosis: 08/15/24, Acute on chronic heart failure with preserved ejection fraction   Past Medical History:  Diagnosis Date   Acute pulmonary embolism without acute cor pulmonale (HCC) 01/13/2024   Bradycardia    SR/SB with up to 3.5 second pause (asymptomatic) on ~ 10/2017 event monitor; saw EP Dr. Kelsie: avoid AV nodal blocking agents   Chronic kidney disease    COPD (chronic obstructive pulmonary disease) (HCC)    Depression    Depression with anxiety 08/29/2021   Diabetes mellitus    Dysarthria, post-stroke    Dysrhythmia    Ganglion cyst 03/09/2012   Heart block AV complete (HCC) 12/11/2023   Hematuria 01/24/2024   History of kidney stones    Hyperlipidemia    Hypertension    Hypokalemia    Ingrown toenail of right foot 05/15/2024   Myocardial infarction [facility])    NSTEMI   Non-ST elevation (NSTEMI) myocardial infarction (HCC) 12/11/2023   Obesity, unspecified 12/17/2011   Pneumonia    Pneumonia of left lower lobe due to infectious organism 12/12/2023   PONV (postoperative nausea and vomiting)    Skin lesion of face 11/17/2022   Stroke [facility])    2019   right side hemiparesis    Assessment: Patient Reported Symptoms: Cognitive Cognitive Status: Alert and oriented to person, place, and time, Normal speech and language skills      Neurological Neurological Review of Symptoms: Weakness    HEENT HEENT Symptoms Reported: No symptoms reported      Cardiovascular Cardiovascular Symptoms Reported: Dizziness, Other: Other Cardiovascular Symptoms: Some dizziness in the am upon rising, Sits on side of bed till goes away. Reported of low heart rate down to  30's, today/yesterday upper 50's and low 60's. Recommended monitoring and logging along with blood pressure, daily weights, and blood sugars to take to all provider appointments. Does patient have uncontrolled Hypertension?: No Cardiovascular Management Strategies: Activity, Medical device, Medication therapy, Routine screening, Coping strategies Weight: 180 lb 6.4 oz (81.8 kg) Cardiovascular Self-Management Outcome: 4 (good) Cardiovascular Comment: Denies chest pain or shortness of breath at rest per spouse as informant for part of call.  Respiratory Respiratory Symptoms Reported: No symptoms reported Other Respiratory Symptoms: Using routine inhalers, has not needed rescue inhaler or nebulizers as much per spouse. Respiratory Management Strategies: Activity, Adequate rest, Fluid modification, Medication therapy, Routine screening Respiratory Self-Management Outcome: 4 (good)  Endocrine Endocrine Symptoms Reported: No symptoms reported Is patient diabetic?: Yes Is patient checking blood sugars at home?: Yes List most recent blood sugar readings, include date and time of day: 130 Endocrine Self-Management Outcome: 4 (good)  Gastrointestinal Gastrointestinal Symptoms Reported: No symptoms reported Additional Gastrointestinal Details: Going to bathroom both BM and urine daily per spouse. Gastrointestinal Management Strategies: Activity, Coping strategies, Fluid modification, Medication therapy Gastrointestinal Self-Management Outcome: 4 (good)    Genitourinary Genitourinary Symptoms Reported: Incontinence Additional Genitourinary Details: Due to 80 mg of Lasix  twice a day, weakness, and HF, patient is having night time incontinence, wearing incontinence garments, is able to tell spouse when needs to go during the day, Spouse concerned about patient getting up at night and wants to inquire about a PureWick for  night time, recommended speaking to PCP regarding this issue. Spouse reports urine is  pinkish again, no other bleeding issues on assessment, reviewed precautions and to discuss with provider visit on 09/01/24 and advised to call earlier if worsens or other bleeding issues arise per review. R CM will route this note to PCP and Cardiologist if possible, spouse to make a list of questions/concerns discussed to providers.  Genitourinary Management Strategies: Incontinence garment/pad, Coping strategies, Medication therapy, Fluid modification Genitourinary Self-Management Outcome: 4 (good)  Integumentary Integumentary Symptoms Reported: No symptoms reported Skin Self-Management Outcome: 4 (good)  Musculoskeletal Musculoskeletal Symptoms Reviewed: Unsteady gait, Weakness, Difficulty walking Additional Musculoskeletal Details: Residual defiticts from prior CVA Musculoskeletal Management Strategies: Activity, Coping strategies, Medication therapy, Routine screening Musculoskeletal Comment: Improving per spouse.   Patient at Risk for Falls Due to: History of fall(s), Impaired mobility, Impaired balance/gait Fall risk Follow up: Falls prevention discussed  Psychosocial Psychosocial Symptoms Reported: No symptoms reported          08/23/2024 Lab work from patient chart: History of COPD: Eosinophils Relative 3   % Final  Eosinophils Absolute 0.2  0.0 - 0.5 K/uL Fi   Today's Vitals   08/31/24 1233  BP: (!) 131/59  Pulse: 60  Weight: 180 lb 6.4 oz (81.8 kg)   Pain Scale: 0-10 Pain Score: 0-No pain  Medications Reviewed Today     Reviewed by Carolee Heron NOVAK, RN (Case Manager) on 08/31/24 at 1213  Med List Status: <None>   Medication Order Taking? Sig Documenting Provider Last Dose Status Informant  albuterol  (PROVENTIL ) (2.5 MG/3ML) 0.083% nebulizer solution [id number] Yes Inhale 3 mLs (2.5 mg total) into the lungs every 6 (six) hours as needed for wheezing or shortness of breath. Darci Pore, MD  Active Spouse/Significant Other, Pharmacy Records  albuterol  (VENTOLIN  HFA)  108 [phone] Base) MCG/ACT inhaler [id number] Yes Inhale 2 puffs into the lungs every 6 (six) hours as needed for wheezing or shortness of breath. Perri DELENA Meliton Mickey., MD  Active Spouse/Significant Other, Pharmacy Records  amLODipine  (NORVASC ) 10 MG tablet [id number] Yes Take 1 tablet (10 mg total) by mouth daily. Singh, Prashant K, MD  Active Spouse/Significant Other, Pharmacy Records  aspirin  EC 81 MG tablet [id number] Yes Take 1 tablet (81 mg total) by mouth daily. Swallow whole. Perri DELENA Meliton Mickey., MD  Active Spouse/Significant Other, Pharmacy Records  budesonide -glycopyrrolate -formoterol  (BREZTRI  AEROSPHERE) 160-9-4.8 MCG/ACT AERO inhaler [id number] Yes Inhale 2 puffs into the lungs 2 (two) times daily. Perri DELENA Meliton Mickey., MD  Active Spouse/Significant Other, Pharmacy Records  carvedilol  (COREG ) 3.125 MG tablet [id number]  Take 1 tablet (3.125 mg total) by mouth 2 (two) times daily with a meal.  Patient not taking: Reported on 08/31/2024   Singh, Prashant K, MD  Active Spouse/Significant Other, Pharmacy Records  citalopram  (CELEXA ) 10 MG tablet [id number] Yes TAKE 1 TABLET BY MOUTH DAILY Early, Sara E, NP  Active Spouse/Significant Other, Pharmacy Records  Continuous Glucose Sensor (DEXCOM G7 SENSOR) MISC [id number] Yes Take 1 Device by mouth See admin instructions. Change sensor every 10 days [provider]  Active Spouse/Significant Other, Pharmacy Records  ELIQUIS  5 MG TABS tablet [id number] Yes TAKE 1 TABLET BY MOUTH 2 TIMES A DAY Mohamed, Mohamed, MD  Active Spouse/Significant Other, Pharmacy Records  ferrous sulfate 325 (65 FE) MG tablet [id number] Yes Take 325 mg by mouth daily with breakfast. Check dose from spouse, he reported an OTC iron supplement. [provider]  Active   furosemide  (LASIX ) 40 MG tablet [id number]  Yes Take 1 tablet (40 mg total) by mouth 2 (two) times daily. Caleen Colander, MD  Active   insulin  aspart (NOVOLOG  FLEXPEN) 100 UNIT/ML FlexPen [id number] Yes Inject  2-10 Units into the skin 3 (three) times daily with meals. [provider]  Active Spouse/Significant Other, Pharmacy Records  insulin  degludec (TRESIBA  FLEXTOUCH) 100 UNIT/ML FlexTouch Pen [id number] Yes 22u AM and 10u PM. Early, Sara E, NP  Active Spouse/Significant Other, Pharmacy Records  lidocaine -prilocaine  (EMLA ) cream [id number] Yes Apply to the Port-A-Cath site 30-60 minutes before treatment Sherrod Sherrod, MD  Active Spouse/Significant Other, Pharmacy Records  Melatonin 5 MG CHEW [id number] Yes Chew 10 mg by mouth at bedtime. [provider]  Active Spouse/Significant Other, Pharmacy Records  Multiple Vitamins-Minerals (MULTIVITAMIN GUMMIES WOMENS) CICERO [id number] Yes Chew 2 each by mouth in the morning. [provider]  Active Spouse/Significant Other, Pharmacy Records  olmesartan  (BENICAR ) 20 MG tablet [id number] Yes Take 1 tablet (20 mg total) by mouth in the morning. Singh, Prashant K, MD  Active Spouse/Significant Other, Pharmacy Records  potassium chloride  (KLOR-CON  M) 10 MEQ tablet [id number] Yes Take 1 tablet (10 mEq total) by mouth daily. Singh, Prashant K, MD  Active Spouse/Significant Other, Pharmacy Records  prochlorperazine  (COMPAZINE ) 10 MG tablet [id number] Yes Take 1 tablet (10 mg total) by mouth every 6 (six) hours as needed. Heilingoetter, Cassandra L, PA-C  Active Spouse/Significant Other, Pharmacy Records  rosuvastatin  (CRESTOR ) 40 MG tablet [id number] Yes TAKE 1 TABLET BY MOUTH DAILY Early, Sara E, NP  Active Spouse/Significant Other, Pharmacy Records  UNABLE TO FIND [id number] Yes Take 1 tablet by mouth with breakfast, with lunch, and with evening meal. GlucoGold -Berberine, Concentrated Cinnamon, Chromium, Banaba Leaf Extract [provider]  Active Spouse/Significant Other, Pharmacy Records  Med List Note Tandy Sauers, CPhT 12/23/23 1134): Husband manages medications             Goals Addressed             This Visit's  Progress    VBCI Transitions of Care (TOC) Care Plan   On track    08/31/2024 Reviewed or Updated with patient and spouse on the call:   Problems:  Recent Hospitalization for treatment of CHF Knowledge Deficit Related to CHF Ongoing cancer treatments  Goal:  Over the next 30 days, the patient will not experience hospital readmission  Interventions:   Heart Failure Interventions: Review bleeding and fall precautions on Eliquis  and mobility weakness Spouse reports urine again pinkish without other bleeding issues noted such as bruising, nosebleeds, dark stools.  Spouse to report to cardiologist on 09/01/24 but to call if increases suddenly or associated with other bleeding issues discussed/reviewed.  Provided education on low sodium diet Assessed need for readable accurate scales in home Provided education about placing scale on hard, flat surface Advised patient to weigh each morning after emptying bladder Discussed importance of daily weight and advised patient to weigh and record daily Reviewed role of diuretics in prevention of fluid overload and management of heart failure; Discussed the importance of keeping all appointments with provider Provided patient with education about the role of exercise in the management of heart failure Advised patient to discuss changes in urine to pinkish again and to discuss with provider on 09/01/24  but to call if increases suddenly or associated with other bleeding issues reviewed.  Screening for signs and symptoms of depression related to chronic disease state  Assessed social determinant of health barriers  Checked with spouse on patient's lowed extremity  edema and he reported it was good today on call  Weight reported as 180.4 lbs.  Blood sugar; 130 BP 131/59 HR in upper 50's and low 60's yesterday Having some bradycardia or low heart rates:  Cardiology follow up 09/01/24.  Take blood pressure and heart rate, along with daily weights and blood  sugars, and any concerns or associated symptoms such as fall precautions due to weakness and being on blood thinners.   Patient Self Care Activities:  Attend all scheduled provider appointments Call pharmacy for medication refills 3-7 days in advance of running out of medications Call provider office for new concerns or questions  Notify RN Care Manager of TOC call rescheduling needs Participate in Transition of Care Program/Attend TOC scheduled calls Take medications as prescribed   call office if I gain more than 2 pounds in one day or 5 pounds in one week or per provider instructions do ankle pumps when sitting watch for swelling in feet, ankles and legs every day weigh myself daily and keep on log/phone along with blood pressure, daily weights, blood sugars, and any other changes, symptoms or concerns to take to all provider visits.  develop a rescue plan that family or friends can easily see and follow if needed or caregiver unavailable.  know when to call the doctor:per provider instructions and reviewed on discharge summary dress right for the weather, hot or cold Continue to follow fluid restrictions, but keep hydrated up to 1500 ml reported daily limit enter blood sugar readings and medication or insulin  into daily log take the blood sugar log to all doctor visits do heel pump exercise 2 to 3 times each day keep feet up while sitting check blood pressure daily choose a place to take my blood pressure (home, clinic or office, retail store) write blood pressure results in a log or diary keep a blood pressure log and heart rate log to take to all provider visits.  take blood pressure log to all doctor appointments call doctor for signs and symptoms of high blood pressure keep all doctor appointments take medications for blood pressure exactly as prescribed report new symptoms to your doctor Follow all provider instructions if differ from these recommendations.   Plan:   Next  PCP appointment scheduled for: 08/29/2024 in person: Was canceled and will need to be rescheduled per spouse. Had virtual PCP HFU on 08/16/2024 Telephone follow up appointment with care management team member scheduled for:  09/07/2024 at 1100 am         Recommendation:   Continue Current Plan of Care Follow up with cardiology on 09/21/2024 and other providers as scheduled: Review concerns, pinkish urine, dizzy in am, headaches at night, low heart rate, felt woozy after last cancer treatment Don't hesitate to take to UC or call 911 if after hours and condition worsens.   Follow Up Plan:   Telephone follow-up in 1 week Telephone follow up appointment with care management team member scheduled for:  09/07/2024 at 1100 am  Bing Edison MSN, RN RN Case Manager [facility]  VBCI-Population Health Office Hours M-F [phone] Direct Dial: [phone] Main Phone [phone]  Fax: [phone] [email or web address]       "

## 2024-08-31 NOTE — Patient Instructions (Addendum)
 Visit Information  Thank you for taking time to visit with me today. Please don't hesitate to contact me if I can be of assistance to you before our next scheduled telephone appointment.   Following is a copy of your care plan:   Goals Addressed             This Visit's Progress    VBCI Transitions of Care (TOC) Care Plan   On track    08/31/2024 Reviewed or Updated with patient and spouse on the call:   Problems:  Recent Hospitalization for treatment of CHF Knowledge Deficit Related to CHF Ongoing cancer treatments  Goal:  Over the next 30 days, the patient will not experience hospital readmission  Interventions:   Heart Failure Interventions: Review bleeding and fall precautions on Eliquis  and mobility weakness Spouse reports urine again pinkish without other bleeding issues noted such as bruising, nosebleeds, dark stools.  Spouse to report to cardiologist on 09/01/24 but to call if increases suddenly or associated with other bleeding issues discussed/reviewed.  Provided education on low sodium diet Assessed need for readable accurate scales in home Provided education about placing scale on hard, flat surface Advised patient to weigh each morning after emptying bladder Discussed importance of daily weight and advised patient to weigh and record daily Reviewed role of diuretics in prevention of fluid overload and management of heart failure; Discussed the importance of keeping all appointments with provider Provided patient with education about the role of exercise in the management of heart failure Advised patient to discuss changes in urine to pinkish again and to discuss with provider on 09/01/24  but to call if increases suddenly or associated with other bleeding issues reviewed.  Screening for signs and symptoms of depression related to chronic disease state  Assessed social determinant of health barriers  Checked with spouse on patient's lowed extremity edema and he  reported it was good today on call  Weight reported as 180.4 lbs.  Blood sugar; 130 BP 131/59 HR in upper 50's and low 60's yesterday Having some bradycardia or low heart rates:  Cardiology follow up 09/01/24.  Take blood pressure and heart rate, along with daily weights and blood sugars, and any concerns or associated symptoms such as fall precautions due to weakness and being on blood thinners.   Patient Self Care Activities:  Attend all scheduled provider appointments Call pharmacy for medication refills 3-7 days in advance of running out of medications Call provider office for new concerns or questions  Notify RN Care Manager of TOC call rescheduling needs Participate in Transition of Care Program/Attend TOC scheduled calls Take medications as prescribed   call office if I gain more than 2 pounds in one day or 5 pounds in one week or per provider instructions do ankle pumps when sitting watch for swelling in feet, ankles and legs every day weigh myself daily and keep on log/phone along with blood pressure, daily weights, blood sugars, and any other changes, symptoms or concerns to take to all provider visits.  develop a rescue plan that family or friends can easily see and follow if needed or caregiver unavailable.  know when to call the doctor:per provider instructions and reviewed on discharge summary dress right for the weather, hot or cold Continue to follow fluid restrictions, but keep hydrated up to 1500 ml reported daily limit enter blood sugar readings and medication or insulin  into daily log take the blood sugar log to all doctor visits do heel pump exercise 2  to 3 times each day keep feet up while sitting check blood pressure daily choose a place to take my blood pressure (home, clinic or office, retail store) write blood pressure results in a log or diary keep a blood pressure log and heart rate log to take to all provider visits.  take blood pressure log to all doctor  appointments call doctor for signs and symptoms of high blood pressure keep all doctor appointments take medications for blood pressure exactly as prescribed report new symptoms to your doctor Follow all provider instructions if differ from these recommendations.   Plan:   Next PCP appointment scheduled for: 08/29/2024 in person: Was canceled and will need to be rescheduled per spouse. Had virtual PCP HFU on 08/16/2024 Telephone follow up appointment with care management team member scheduled for:  09/07/2024 at 1100 am       Recommendation:   Continue Current Plan of Care Follow up with cardiology on 09/21/2024 and other providers as scheduled: Review concerns, pinkish urine, dizzy in am, headaches at night, low heart rate, felt woozy after last cancer treatment Don't hesitate to take to UC or call 911 if after hours and condition worsens.  Patient verbalizes understanding of instructions and care plan provided today and agrees to view in MyChart. Active MyChart status and patient understanding of how to access instructions and care plan via MyChart confirmed with patient.     Telephone follow up appointment with care management team member scheduled for: Telephone follow up appointment with care management team member scheduled for:  09/07/2024 at 1100 am  Please call the care guide team at [phone] if you need to cancel or reschedule your appointment.   Please call the [place]  National Suicide Prevention Lifeline: (343)419-7389 or TTY: 347-485-9071 TTY 918-884-5364) to talk to a trained counselor call 1-800-273-TALK (toll free, 24 hour hotline) if you are experiencing a Mental Health or Behavioral Health Crisis or need someone to talk to.   Bing Edison MSN, RN RN [employer] [facility]  VBCI-Population Health Office Hours M-F [phone] Direct Dial: [phone] Main Phone [phone]  Fax: [phone] .com

## 2024-09-01 ENCOUNTER — Ambulatory Visit: Admitting: Physician Assistant

## 2024-09-01 VITALS — BP 98/45 | HR 46 | Ht 63.0 in | Wt 182.0 lb

## 2024-09-01 DIAGNOSIS — Z79899 Other long term (current) drug therapy: Secondary | ICD-10-CM

## 2024-09-01 NOTE — Progress Notes (Unsigned)
 " Cardiology Office Note   Date:  09/01/2024  ID:  Kelly, Gibson 09/14/1960, MRN [id number] PCP: Oris [name], NP  [facility] Providers Cardiologist:  Vinie JAYSON Maxcy, MD { Click to update primary MD,subspecialty MD or APP then REFRESH:1}    History of Present Illness Kelly Gibson is a 64 y.o. female with past medical history of HTN, HLD, DM II, COPD, depression, CVA, PE and lung cancer.  Patient had a subacute CVA in [facility] in February 2019.  She was admitted to [facility] on 10/26/2017 again with possible recurrence of CVA.  Initial MRI of the brain was negative for acute CVA.  Patient was found to have severely elevated blood pressure with systolic blood pressure in the 190s.  She was also noted to be bradycardic in the hospital with heart rate into the 40s.  She was not on any AV nodal blocking agent.  Echocardiogram showed EF 60 to 65%, moderate LVH, trace to mild MR, grade 2 DD with elevated LVEDP.  Outpatient 30-day event monitor was recommended to look for symptomatic bradycardia.  Patient was readmitted in May 2019 with CVA.  MRI showed small acute lacunar infarct along with left frontal horn ventricular white matter.  Subacute lacunar infarction in the left posterior corona radiata.  Numerous chronic lacunar infarct in the deep gray-white matter.  She was placed on 325 mg daily aspirin  for CVA prophylaxis.  Telemetry during this admission showed a sinus rhythm, nocturnal sinus pauses, occasional daytime pauses.  Patient was seen by Dr. Kelsie, it was recommended to avoid AV nodal blocking agent, otherwise no obvious sign of symptomatic bradycardia to suggest pacemaker.  It was recommended also to consider outpatient sleep study as well once improved from stroke.  Heart monitor showed nocturnal sinus pauses up to 3.4 seconds.  Patient was completely asymptomatic.  He has been lost to follow-up from 2019 all the way until May 2025.     While in [place]   visiting, she suddenly noticed shortness of breath with mild cough.  She also has some chest pain with deep breath and cough.  Due to shortness of breath not improving, she went to [facility] in [place]  for further assessment.  Initial troponin was elevated at 282.  CT of the chest was ruled out for PE but had left basilar consolidation concerning for pneumonia.  She was treated for acute hypoxic respiratory failure initially requiring BiPAP therapy.  She was unresponsive to nebulizer treatment for COPD. Echocardiogram performed on 12/08/2023 demonstrated EF 55%.  Patient eventually developed septic shock with bacteremia with E. coli that thought to be secondary to urosepsis for which she was placed on IV antibiotic.  Subsequent high-sensitivity troponin trended up to greater than 60,000 and she was managed as NSTEMI.  She was placed on aspirin  and Plavix  and IV heparin .  Given sepsis, it was opted not to pursue urgent coronary angiography.  Heart rate was in the 40s to 50s during the hospitalization with occasional sinus pause.  Per cardiology consult note over there, she may had intermittent complete heart block.  She did not wish to stay in [place]  and requested to be transferred to [facility] to further care.  ECG in the hospital showed likely anterior infarct.  Echocardiogram obtained on 12/12/2023 showed EF 50 to 55%, low normal EF, moderate LVH, grade 1 DD, inferior wall and mid inferolateral segment were akinetic.  Troponin was up to 15,000.  She also had a  left partial staghorn calculus with mild hydronephrosis, urology service was consulted who recommended left nephrostomy tube placement.  Plavix  was held in anticipation of urology procedure, she was consider a high risk candidate for low risk procedure.  She was seen by Dr. Wonda during the hospitalization, considering her baseline functional status with significant physical impairment from her stroke as well as multiple medical issues,  he did not think the patient was a good candidate for invasive evaluation with cardiac catheterization.  Once she recovered from her acute illness, outpatient stress test can be considered.  Outpatient Myoview  was eventually completed on 12/16/2023, this was high risk study, there was no evidence of infarction but there was a large defect of severe reduction in uptake present in the apical to basal anterior, anterolateral and anteroseptal, lateral and apex location that is consistent with ischemia in the LAD territory.  I last saw the patient in May 2025.  EKG at the time showed evidence of advanced A-V dissociation.  I reviewed her case with DOD Dr. Jordan at the time.  In anticipation of her urology procedure, we ultimately recommended to proceed with diagnostic cardiac catheterization prior to her urology surgery.  She underwent diagnostic cardiac catheterization by Dr. Elmira on 12/28/2023 that showed 100% occluded mid LAD, distal vessel filled with collaterals from septal branches.  70% proximal small OM1, occluded proximal OM 2 with distal vessel filled from collaterals from septal branches, occluded mid RCA with distal collaterals also from septal branches.  Shortly after her cardiac catheterization, she returned back to the hospital due to unstable angina and was seen by Dr. Shyrl.  She was considered high risk for postoperative complications.  Patient did not wish to proceed with surgery.  During the hospitalization, she was also found to have a dense masslike consolidation in the left lower lobe.  She underwent bronchoscopy with biopsy.  Biopsy revealed non-small cell carcinoma.  She was admitted near the end of June 2025 with acute on chronic diastolic heart failure and underwent further diuresis.  During the same hospitalization, she was diagnosed with acute PE and was started on Eliquis .  After discharge, patient established with Dr. Gatha of oncology service.  She has been treated with  chemotherapy.  More recently, patient was admitted with acute respiratory failure in December 2025.  She was ruled in for SIRS and treated for possible pneumonia suspected from aspiration.  She required BiPAP therapy.  She also required IV Lasix  during the hospitalization.  She was admitted in January 2026 with acute on chronic diastolic heart failure.  Echocardiogram was repeated on 08/10/2024 that showed EF 55 to 60%, regional wall motion abnormality, normal RV, mild LAE, trivial MR.  Hospital course complicated by moderate-sized left pleural effusion requiring thoracentesis with removal of 500 cc.  Lasix  was increased from the previous 40 mg daily to 40 mg twice a day.  Since discharge, she had a repeat blood work on 08/15/2024 that showed creatinine bumped up to 1.59.  Patient presents today for cardiology follow-up accompanied by husband.  Her blood pressure is soft and her heart rate is borderline slow.  She has not been taking her carvedilol  for some time due to slow heart rate.  She has previously known conduction disease.  She denies any dizziness, blurry vision or feeling of passing out.  She does not need a pacemaker at this time.  She denies any major chest discomfort.  Since recent hospitalization, her Lasix  was increased to 40 mg twice a day.  With a bump in the creatinine, I suspect she is dehydrated.  I asked her to hold olmesartan  this weekend and resume on Monday.  I also asked her to hold the afternoon dose of Lasix  from today all the way through Sunday and restart on Monday and 40 mg a.m. and 20 mg p.m. dosing.  She will need a basic metabolic panel next Friday.  We will remove carvedilol  from her med list.  I plan to see the patient back in 4 to 6 weeks myself and have her see Dr. Mona in 4 to 5 months.  ROS: ***  Studies Reviewed      *** Risk Assessment/Calculations {Does this patient have ATRIAL FIBRILLATION?:[phone]}         Physical Exam VS:  LMP 12/10/2012        Wt  Readings from Last 3 Encounters:  08/31/24 180 lb 6.4 oz (81.8 kg)  08/15/24 177 lb 14.6 oz (80.7 kg)  08/01/24 181 lb (82.1 kg)    GEN: Well nourished, well developed in no acute distress NECK: No JVD; No carotid bruits CARDIAC: ***RRR, no murmurs, rubs, gallops RESPIRATORY:  Clear to auscultation without rales, wheezing or rhonchi  ABDOMEN: Soft, non-tender, non-distended EXTREMITIES:  No edema; No deformity   ASSESSMENT AND PLAN ***    {Are you ordering a CV Procedure (e.g. stress test, cath, DCCV, TEE, etc)?   Press F2        :[id number]}  Dispo: ***  Signed, Scot Ford, PA  "

## 2024-09-01 NOTE — Patient Instructions (Signed)
 Medication Instructions:  DISCONTINUED CARVEDILOL   HOLD OLMESARTAN  AND RESTART 09/04/24 HOLD AFTERNOON DOSE OF LASIX  TILL SUNDAY AND RESTART 09/04/24, THEN TAKE 40 MG IN THE MORNING AND 20 MG IN THE EVENING *If you need a refill on your cardiac medications before your next appointment, please call your pharmacy*  Lab Work: BMET NEXT FRIDAY 09/08/24 If you have labs (blood work) drawn today and your tests are completely normal, you will receive your results only by: MyChart Message (if you have MyChart) OR A paper copy in the mail If you have any lab test that is abnormal or we need to change your treatment, we will call you to review the results.  Testing/Procedures: NO TESTING  Follow-Up: At [facility], you and your health needs are our priority.  As part of our continuing mission to provide you with exceptional heart care, our providers are all part of one team.  This team includes your primary Cardiologist (physician) and Advanced Practice Providers or APPs (Physician Assistants and Nurse Practitioners) who all work together to provide you with the care you need, when you need it.  Your next appointment:   4-6 week(s)  Provider:   Scot Ford, PA-C  ONLY    Then, Vinie JAYSON Maxcy, MD will plan to see you again in 4-5 month(s).   Other Instructions

## 2024-09-07 ENCOUNTER — Telehealth

## 2024-09-12 ENCOUNTER — Inpatient Hospital Stay

## 2024-09-12 ENCOUNTER — Inpatient Hospital Stay: Admitting: Physician Assistant

## 2024-09-19 ENCOUNTER — Inpatient Hospital Stay

## 2024-09-19 ENCOUNTER — Inpatient Hospital Stay: Admitting: Internal Medicine

## 2024-09-19 ENCOUNTER — Inpatient Hospital Stay: Attending: Internal Medicine | Admitting: Dietician

## 2024-10-17 ENCOUNTER — Inpatient Hospital Stay

## 2024-10-17 ENCOUNTER — Inpatient Hospital Stay: Attending: Internal Medicine

## 2024-10-17 ENCOUNTER — Inpatient Hospital Stay: Admitting: Physician Assistant

## 2024-10-26 ENCOUNTER — Ambulatory Visit: Admitting: Cardiology

## 2024-11-14 ENCOUNTER — Inpatient Hospital Stay: Attending: Internal Medicine

## 2024-11-14 ENCOUNTER — Inpatient Hospital Stay: Admitting: Internal Medicine

## 2024-11-14 ENCOUNTER — Inpatient Hospital Stay

## 2025-04-24 ENCOUNTER — Ambulatory Visit: Payer: Self-pay
